# Patient Record
Sex: Female | Born: 1960
Health system: Southern US, Community
[De-identification: ages and names within clinical notes are randomized; demographics above are authoritative.]

## PROBLEM LIST (undated history)

## (undated) ENCOUNTER — Emergency Department (HOSPITAL_COMMUNITY): Admission: EM | Payer: No Typology Code available for payment source

## (undated) DIAGNOSIS — I5030 Unspecified diastolic (congestive) heart failure: Secondary | ICD-10-CM

## (undated) DIAGNOSIS — I8390 Asymptomatic varicose veins of unspecified lower extremity: Secondary | ICD-10-CM

## (undated) DIAGNOSIS — I5032 Chronic diastolic (congestive) heart failure: Secondary | ICD-10-CM

## (undated) DIAGNOSIS — R002 Palpitations: Secondary | ICD-10-CM

## (undated) DIAGNOSIS — E039 Hypothyroidism, unspecified: Secondary | ICD-10-CM

## (undated) DIAGNOSIS — B009 Herpesviral infection, unspecified: Secondary | ICD-10-CM

## (undated) DIAGNOSIS — R519 Headache, unspecified: Secondary | ICD-10-CM

## (undated) DIAGNOSIS — M5417 Radiculopathy, lumbosacral region: Secondary | ICD-10-CM

## (undated) DIAGNOSIS — G8929 Other chronic pain: Secondary | ICD-10-CM

## (undated) DIAGNOSIS — G4700 Insomnia, unspecified: Secondary | ICD-10-CM

## (undated) DIAGNOSIS — E538 Deficiency of other specified B group vitamins: Secondary | ICD-10-CM

## (undated) DIAGNOSIS — D72819 Decreased white blood cell count, unspecified: Secondary | ICD-10-CM

## (undated) DIAGNOSIS — R0602 Shortness of breath: Secondary | ICD-10-CM

## (undated) DIAGNOSIS — I071 Rheumatic tricuspid insufficiency: Secondary | ICD-10-CM

## (undated) DIAGNOSIS — N6019 Diffuse cystic mastopathy of unspecified breast: Secondary | ICD-10-CM

## (undated) DIAGNOSIS — I340 Nonrheumatic mitral (valve) insufficiency: Secondary | ICD-10-CM

## (undated) DIAGNOSIS — Z87898 Personal history of other specified conditions: Secondary | ICD-10-CM

## (undated) DIAGNOSIS — N189 Chronic kidney disease, unspecified: Secondary | ICD-10-CM

## (undated) DIAGNOSIS — K829 Disease of gallbladder, unspecified: Secondary | ICD-10-CM

## (undated) DIAGNOSIS — I499 Cardiac arrhythmia, unspecified: Secondary | ICD-10-CM

## (undated) DIAGNOSIS — M549 Dorsalgia, unspecified: Secondary | ICD-10-CM

## (undated) DIAGNOSIS — M4800 Spinal stenosis, site unspecified: Secondary | ICD-10-CM

## (undated) DIAGNOSIS — E610 Copper deficiency: Secondary | ICD-10-CM

## (undated) DIAGNOSIS — K5900 Constipation, unspecified: Secondary | ICD-10-CM

## (undated) DIAGNOSIS — H269 Unspecified cataract: Secondary | ICD-10-CM

## (undated) DIAGNOSIS — T7840XA Allergy, unspecified, initial encounter: Secondary | ICD-10-CM

## (undated) DIAGNOSIS — E669 Obesity, unspecified: Secondary | ICD-10-CM

## (undated) DIAGNOSIS — D510 Vitamin B12 deficiency anemia due to intrinsic factor deficiency: Secondary | ICD-10-CM

## (undated) DIAGNOSIS — L732 Hidradenitis suppurativa: Secondary | ICD-10-CM

## (undated) DIAGNOSIS — K911 Postgastric surgery syndromes: Secondary | ICD-10-CM

## (undated) DIAGNOSIS — R200 Anesthesia of skin: Secondary | ICD-10-CM

## (undated) DIAGNOSIS — M5126 Other intervertebral disc displacement, lumbar region: Secondary | ICD-10-CM

## (undated) DIAGNOSIS — R42 Dizziness and giddiness: Secondary | ICD-10-CM

## (undated) DIAGNOSIS — M719 Bursopathy, unspecified: Secondary | ICD-10-CM

## (undated) DIAGNOSIS — F419 Anxiety disorder, unspecified: Secondary | ICD-10-CM

## (undated) DIAGNOSIS — E0590 Thyrotoxicosis, unspecified without thyrotoxic crisis or storm: Secondary | ICD-10-CM

## (undated) DIAGNOSIS — G5730 Lesion of lateral popliteal nerve, unspecified lower limb: Secondary | ICD-10-CM

## (undated) DIAGNOSIS — I10 Essential (primary) hypertension: Secondary | ICD-10-CM

## (undated) DIAGNOSIS — E161 Other hypoglycemia: Secondary | ICD-10-CM

## (undated) DIAGNOSIS — E890 Postprocedural hypothyroidism: Secondary | ICD-10-CM

## (undated) DIAGNOSIS — R51 Headache: Secondary | ICD-10-CM

## (undated) DIAGNOSIS — Z5189 Encounter for other specified aftercare: Secondary | ICD-10-CM

## (undated) DIAGNOSIS — K219 Gastro-esophageal reflux disease without esophagitis: Secondary | ICD-10-CM

## (undated) DIAGNOSIS — R600 Localized edema: Secondary | ICD-10-CM

## (undated) DIAGNOSIS — M479 Spondylosis, unspecified: Secondary | ICD-10-CM

## (undated) DIAGNOSIS — R202 Paresthesia of skin: Secondary | ICD-10-CM

## (undated) DIAGNOSIS — I272 Pulmonary hypertension, unspecified: Secondary | ICD-10-CM

## (undated) DIAGNOSIS — Z8619 Personal history of other infectious and parasitic diseases: Secondary | ICD-10-CM

## (undated) DIAGNOSIS — M779 Enthesopathy, unspecified: Secondary | ICD-10-CM

## (undated) DIAGNOSIS — Z9889 Other specified postprocedural states: Secondary | ICD-10-CM

## (undated) DIAGNOSIS — M2550 Pain in unspecified joint: Secondary | ICD-10-CM

## (undated) DIAGNOSIS — K649 Unspecified hemorrhoids: Secondary | ICD-10-CM

## (undated) DIAGNOSIS — S83249A Other tear of medial meniscus, current injury, unspecified knee, initial encounter: Secondary | ICD-10-CM

## (undated) DIAGNOSIS — E611 Iron deficiency: Secondary | ICD-10-CM

## (undated) DIAGNOSIS — R112 Nausea with vomiting, unspecified: Secondary | ICD-10-CM

## (undated) DIAGNOSIS — G4733 Obstructive sleep apnea (adult) (pediatric): Secondary | ICD-10-CM

## (undated) DIAGNOSIS — Z86010 Personal history of colonic polyps: Secondary | ICD-10-CM

## (undated) DIAGNOSIS — M1990 Unspecified osteoarthritis, unspecified site: Secondary | ICD-10-CM

## (undated) DIAGNOSIS — R7303 Prediabetes: Secondary | ICD-10-CM

## (undated) DIAGNOSIS — K589 Irritable bowel syndrome without diarrhea: Secondary | ICD-10-CM

## (undated) HISTORY — DX: Spondylosis, unspecified: M47.9

## (undated) HISTORY — DX: Other tear of medial meniscus, current injury, unspecified knee, initial encounter: S83.249A

## (undated) HISTORY — DX: Prediabetes: R73.03

## (undated) HISTORY — PX: TOTAL HIP ARTHROPLASTY: SHX124

## (undated) HISTORY — DX: Herpesviral infection, unspecified: B00.9

## (undated) HISTORY — DX: Thyrotoxicosis, unspecified without thyrotoxic crisis or storm: E05.90

## (undated) HISTORY — DX: Constipation, unspecified: K59.00

## (undated) HISTORY — DX: Localized edema: R60.0

## (undated) HISTORY — DX: Dorsalgia, unspecified: M54.9

## (undated) HISTORY — DX: Vitamin B12 deficiency anemia due to intrinsic factor deficiency: D51.0

## (undated) HISTORY — DX: Gastro-esophageal reflux disease without esophagitis: K21.9

## (undated) HISTORY — DX: Nonrheumatic mitral (valve) insufficiency: I34.0

## (undated) HISTORY — PX: CARDIOVASCULAR STRESS TEST: SHX262

## (undated) HISTORY — DX: Lesion of lateral popliteal nerve, unspecified lower limb: G57.30

## (undated) HISTORY — DX: Disease of gallbladder, unspecified: K82.9

## (undated) HISTORY — PX: JOINT REPLACEMENT: SHX530

## (undated) HISTORY — DX: Enthesopathy, unspecified: M77.9

## (undated) HISTORY — DX: Hypothyroidism, unspecified: E03.9

## (undated) HISTORY — DX: Other chronic pain: G89.29

## (undated) HISTORY — DX: Rheumatic tricuspid insufficiency: I07.1

## (undated) HISTORY — PX: TOTAL ABDOMINAL HYSTERECTOMY: SHX209

## (undated) HISTORY — PX: TUBAL LIGATION: SHX77

## (undated) HISTORY — PX: EYE SURGERY: SHX253

## (undated) HISTORY — DX: Palpitations: R00.2

## (undated) HISTORY — DX: Pulmonary hypertension, unspecified: I27.20

## (undated) HISTORY — DX: Obesity, unspecified: E66.9

## (undated) HISTORY — DX: Hidradenitis suppurativa: L73.2

## (undated) HISTORY — DX: Bursopathy, unspecified: M71.9

## (undated) HISTORY — DX: Irritable bowel syndrome, unspecified: K58.9

## (undated) HISTORY — DX: Unspecified diastolic (congestive) heart failure: I50.30

## (undated) HISTORY — DX: Allergy, unspecified, initial encounter: T78.40XA

## (undated) HISTORY — DX: Unspecified cataract: H26.9

## (undated) HISTORY — DX: Other intervertebral disc displacement, lumbar region: M51.26

## (undated) HISTORY — DX: Paresthesia of skin: R20.2

## (undated) HISTORY — DX: Insomnia, unspecified: G47.00

## (undated) HISTORY — PX: SMALL INTESTINE SURGERY: SHX150

## (undated) HISTORY — DX: Shortness of breath: R06.02

## (undated) HISTORY — DX: Anesthesia of skin: R20.0

## (undated) HISTORY — DX: Encounter for other specified aftercare: Z51.89

## (undated) HISTORY — PX: REFRACTIVE SURGERY: SHX103

## (undated) HISTORY — DX: Iron deficiency: E61.1

## (undated) HISTORY — DX: Personal history of colonic polyps: Z86.010

## (undated) HISTORY — DX: Dizziness and giddiness: R42

## (undated) HISTORY — DX: Pain in unspecified joint: M25.50

## (undated) HISTORY — DX: Obstructive sleep apnea (adult) (pediatric): G47.33

## (undated) HISTORY — DX: Spinal stenosis, site unspecified: M48.00

## (undated) HISTORY — DX: Copper deficiency: E61.0

## (undated) HISTORY — DX: Radiculopathy, lumbosacral region: M54.17

## (undated) HISTORY — DX: Unspecified osteoarthritis, unspecified site: M19.90

## (undated) HISTORY — DX: Chronic diastolic (congestive) heart failure: I50.32

## (undated) HISTORY — PX: HERNIA REPAIR: SHX51

## (undated) HISTORY — DX: Decreased white blood cell count, unspecified: D72.819

---
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
## 1985-02-26 HISTORY — PX: CHOLECYSTECTOMY OPEN: SUR202

## 1998-01-11 ENCOUNTER — Ambulatory Visit: Admission: RE | Admit: 1998-01-11 | Discharge: 1998-01-11 | Payer: Self-pay | Admitting: Family Medicine

## 1999-02-27 DIAGNOSIS — Z8619 Personal history of other infectious and parasitic diseases: Secondary | ICD-10-CM

## 1999-02-27 HISTORY — DX: Personal history of other infectious and parasitic diseases: Z86.19

## 1999-11-22 ENCOUNTER — Emergency Department (HOSPITAL_COMMUNITY): Admission: EM | Admit: 1999-11-22 | Discharge: 1999-11-22 | Payer: Self-pay | Admitting: Emergency Medicine

## 2000-09-27 ENCOUNTER — Encounter: Payer: Self-pay | Admitting: Internal Medicine

## 2000-09-27 ENCOUNTER — Encounter: Admission: RE | Admit: 2000-09-27 | Discharge: 2000-09-27 | Payer: Self-pay | Admitting: Internal Medicine

## 2000-12-05 ENCOUNTER — Encounter: Admission: RE | Admit: 2000-12-05 | Discharge: 2000-12-05 | Payer: Self-pay | Admitting: Internal Medicine

## 2000-12-05 ENCOUNTER — Encounter: Payer: Self-pay | Admitting: Internal Medicine

## 2000-12-24 ENCOUNTER — Encounter: Admission: RE | Admit: 2000-12-24 | Discharge: 2000-12-24 | Payer: Self-pay | Admitting: Internal Medicine

## 2000-12-24 ENCOUNTER — Encounter: Payer: Self-pay | Admitting: Internal Medicine

## 2001-11-17 ENCOUNTER — Other Ambulatory Visit: Admission: RE | Admit: 2001-11-17 | Discharge: 2001-11-17 | Payer: Self-pay | Admitting: Internal Medicine

## 2002-05-05 ENCOUNTER — Encounter: Payer: Self-pay | Admitting: Obstetrics and Gynecology

## 2002-05-05 ENCOUNTER — Encounter: Admission: RE | Admit: 2002-05-05 | Discharge: 2002-05-05 | Payer: Self-pay | Admitting: Obstetrics and Gynecology

## 2002-08-25 ENCOUNTER — Inpatient Hospital Stay (HOSPITAL_COMMUNITY): Admission: RE | Admit: 2002-08-25 | Discharge: 2002-08-27 | Payer: Self-pay | Admitting: Obstetrics and Gynecology

## 2002-08-25 ENCOUNTER — Encounter (INDEPENDENT_AMBULATORY_CARE_PROVIDER_SITE_OTHER): Payer: Self-pay | Admitting: *Deleted

## 2003-06-02 ENCOUNTER — Ambulatory Visit (HOSPITAL_COMMUNITY): Admission: RE | Admit: 2003-06-02 | Discharge: 2003-06-02 | Payer: Self-pay | Admitting: Internal Medicine

## 2003-06-26 ENCOUNTER — Emergency Department (HOSPITAL_COMMUNITY): Admission: EM | Admit: 2003-06-26 | Discharge: 2003-06-27 | Payer: Self-pay | Admitting: *Deleted

## 2003-07-06 ENCOUNTER — Other Ambulatory Visit: Admission: RE | Admit: 2003-07-06 | Discharge: 2003-07-06 | Payer: Self-pay | Admitting: Internal Medicine

## 2004-02-27 HISTORY — PX: PARTIAL HYSTERECTOMY: SHX80

## 2004-06-23 ENCOUNTER — Ambulatory Visit (HOSPITAL_COMMUNITY): Admission: RE | Admit: 2004-06-23 | Discharge: 2004-06-23 | Payer: Self-pay | Admitting: Internal Medicine

## 2004-07-13 ENCOUNTER — Other Ambulatory Visit: Admission: RE | Admit: 2004-07-13 | Discharge: 2004-07-13 | Payer: Self-pay | Admitting: Internal Medicine

## 2005-08-02 ENCOUNTER — Ambulatory Visit (HOSPITAL_COMMUNITY): Admission: RE | Admit: 2005-08-02 | Discharge: 2005-08-02 | Payer: Self-pay | Admitting: Internal Medicine

## 2005-08-03 ENCOUNTER — Other Ambulatory Visit: Admission: RE | Admit: 2005-08-03 | Discharge: 2005-08-03 | Payer: Self-pay | Admitting: Internal Medicine

## 2006-03-06 ENCOUNTER — Ambulatory Visit (HOSPITAL_COMMUNITY): Admission: RE | Admit: 2006-03-06 | Discharge: 2006-03-06 | Payer: Self-pay | Admitting: Internal Medicine

## 2006-04-15 ENCOUNTER — Ambulatory Visit: Payer: Self-pay | Admitting: Internal Medicine

## 2006-04-15 LAB — CONVERTED CEMR LAB
AST: 15 units/L (ref 0–37)
Cholesterol: 172 mg/dL (ref 0–200)
Triglycerides: 36 mg/dL (ref 0–149)

## 2006-08-05 ENCOUNTER — Ambulatory Visit (HOSPITAL_COMMUNITY): Admission: RE | Admit: 2006-08-05 | Discharge: 2006-08-05 | Payer: Self-pay | Admitting: Internal Medicine

## 2007-08-14 ENCOUNTER — Ambulatory Visit (HOSPITAL_COMMUNITY): Admission: RE | Admit: 2007-08-14 | Discharge: 2007-08-14 | Payer: Self-pay | Admitting: Internal Medicine

## 2007-08-19 ENCOUNTER — Encounter: Admission: RE | Admit: 2007-08-19 | Discharge: 2007-08-19 | Payer: Self-pay | Admitting: Internal Medicine

## 2007-10-03 ENCOUNTER — Ambulatory Visit: Payer: Self-pay | Admitting: Internal Medicine

## 2007-10-17 ENCOUNTER — Ambulatory Visit: Payer: Self-pay | Admitting: Internal Medicine

## 2007-10-17 ENCOUNTER — Encounter: Payer: Self-pay | Admitting: Internal Medicine

## 2007-10-17 DIAGNOSIS — Z86010 Personal history of colonic polyps: Secondary | ICD-10-CM

## 2007-10-17 DIAGNOSIS — Z860101 Personal history of adenomatous and serrated colon polyps: Secondary | ICD-10-CM

## 2007-10-17 HISTORY — DX: Personal history of colonic polyps: Z86.010

## 2007-10-17 HISTORY — DX: Personal history of adenomatous and serrated colon polyps: Z86.0101

## 2007-10-20 ENCOUNTER — Encounter: Payer: Self-pay | Admitting: Internal Medicine

## 2007-12-19 ENCOUNTER — Other Ambulatory Visit: Admission: RE | Admit: 2007-12-19 | Discharge: 2007-12-19 | Payer: Self-pay | Admitting: Internal Medicine

## 2007-12-19 ENCOUNTER — Encounter: Payer: Self-pay | Admitting: Internal Medicine

## 2007-12-19 ENCOUNTER — Ambulatory Visit: Payer: Self-pay | Admitting: Internal Medicine

## 2008-02-16 ENCOUNTER — Ambulatory Visit: Payer: Self-pay | Admitting: Internal Medicine

## 2008-02-17 ENCOUNTER — Ambulatory Visit (HOSPITAL_BASED_OUTPATIENT_CLINIC_OR_DEPARTMENT_OTHER): Admission: RE | Admit: 2008-02-17 | Discharge: 2008-02-17 | Payer: Self-pay | Admitting: Podiatry

## 2008-02-17 HISTORY — PX: HAMMER TOE SURGERY: SHX385

## 2008-06-18 ENCOUNTER — Ambulatory Visit: Payer: Self-pay | Admitting: Internal Medicine

## 2008-08-16 ENCOUNTER — Ambulatory Visit (HOSPITAL_COMMUNITY): Admission: RE | Admit: 2008-08-16 | Discharge: 2008-08-16 | Payer: Self-pay | Admitting: Internal Medicine

## 2008-12-01 ENCOUNTER — Emergency Department (HOSPITAL_COMMUNITY): Admission: EM | Admit: 2008-12-01 | Discharge: 2008-12-01 | Payer: Self-pay | Admitting: Emergency Medicine

## 2008-12-17 ENCOUNTER — Other Ambulatory Visit: Admission: RE | Admit: 2008-12-17 | Discharge: 2008-12-17 | Payer: Self-pay | Admitting: Internal Medicine

## 2008-12-17 ENCOUNTER — Ambulatory Visit: Payer: Self-pay | Admitting: Internal Medicine

## 2009-01-31 ENCOUNTER — Inpatient Hospital Stay (HOSPITAL_COMMUNITY): Admission: RE | Admit: 2009-01-31 | Discharge: 2009-02-03 | Payer: Self-pay | Admitting: Orthopedic Surgery

## 2009-03-22 ENCOUNTER — Encounter: Admission: RE | Admit: 2009-03-22 | Discharge: 2009-04-14 | Payer: Self-pay | Admitting: Orthopedic Surgery

## 2009-03-28 ENCOUNTER — Ambulatory Visit: Payer: Self-pay | Admitting: Internal Medicine

## 2009-07-30 ENCOUNTER — Emergency Department (HOSPITAL_COMMUNITY): Admission: EM | Admit: 2009-07-30 | Discharge: 2009-07-30 | Payer: Self-pay | Admitting: Emergency Medicine

## 2009-08-03 ENCOUNTER — Ambulatory Visit: Payer: Self-pay | Admitting: Internal Medicine

## 2009-10-04 ENCOUNTER — Ambulatory Visit (HOSPITAL_COMMUNITY): Admission: RE | Admit: 2009-10-04 | Discharge: 2009-10-04 | Payer: Self-pay | Admitting: Internal Medicine

## 2010-02-03 ENCOUNTER — Ambulatory Visit: Payer: Self-pay | Admitting: Internal Medicine

## 2010-03-09 ENCOUNTER — Telehealth: Payer: Self-pay | Admitting: Internal Medicine

## 2010-03-20 ENCOUNTER — Ambulatory Visit: Admission: RE | Admit: 2010-03-20 | Discharge: 2010-03-20 | Payer: Self-pay | Source: Home / Self Care

## 2010-03-20 DIAGNOSIS — E669 Obesity, unspecified: Secondary | ICD-10-CM | POA: Insufficient documentation

## 2010-03-20 DIAGNOSIS — M549 Dorsalgia, unspecified: Secondary | ICD-10-CM | POA: Insufficient documentation

## 2010-03-27 ENCOUNTER — Encounter: Payer: Self-pay | Admitting: Family Medicine

## 2010-03-28 NOTE — Miscellaneous (Signed)
Summary: [facility] Previsit/prep  Clinical Lists Changes  Medications: Added new medication of MOVIPREP 100 GM  SOLR (PEG-KCL-NACL-NASULF-NA ASC-C) As per prep instructions. - Signed Rx of MOVIPREP 100 GM  SOLR (PEG-KCL-NACL-NASULF-NA ASC-C) As per prep instructions.;  #1 x 0;  Signed;  Entered by: Wyona Almas RN;  Authorized by: Hart Carwin MD;  Method used: Electronic Observations: Added new observation of NKA: T (10/03/2007 17:00)    Prescriptions: MOVIPREP 100 GM  SOLR (PEG-KCL-NACL-NASULF-NA ASC-C) As per prep instructions.  #1 x 0   Entered by:   Wyona Almas RN   Authorized by:   Hart Carwin MD   Signed by:   Wyona Almas RN on 10/03/2007   Method used:   Electronically sent to ...       [facility]*       [address]       [address] Shipping/mailing       [place], [place]  [address]       Ph: [phone]       Fax: [phone]   RxID:   [id number]   Patient given:  Patient Information Guide, Teaching/ Information, Colon Frequently Asked Questions and Colon Pamphlet

## 2010-03-28 NOTE — Miscellaneous (Signed)
Summary: protonix rx  Dr Juanda Chance gave patient an rx for prevacid when she had a procedure, however insurance would not cover prevacid. Patient has requested protonix instead. Hortense Ramal [facility]  December 19, 2007 8:59 AM   Clinical Lists Changes  Medications: Added new medication of PROTONIX 40 MG TBEC (PANTOPRAZOLE SODIUM) Take 1 tablet by mouth once a day  (please use this in place of prevacid rx) - Signed Rx of PROTONIX 40 MG TBEC (PANTOPRAZOLE SODIUM) Take 1 tablet by mouth once a day  (please use this in place of prevacid rx);  #30 x 3;  Signed;  Entered by: Hortense Ramal CMA;  Authorized by: Hart Carwin MD;  Method used: Electronically to [facility]*, [address], [address] Shipping/mailing, [place], [place]  [address], Ph: [phone], Fax: [phone]    Prescriptions: PROTONIX 40 MG TBEC (PANTOPRAZOLE SODIUM) Take 1 tablet by mouth once a day  (please use this in place of prevacid rx)  #30 x 3   Entered by:   Hortense Ramal CMA   Authorized by:   Hart Carwin MD   Signed by:   Hortense Ramal CMA on 12/19/2007   Method used:   Electronically to        [facility]* (retail)       [address]       [address] Shipping/mailing       [place], [place]  [address]       Ph: [phone]       Fax: [phone]   RxID:   [id number]

## 2010-03-28 NOTE — Letter (Signed)
Summary: Patient Notice- Polyp Results  [facility]  [address] [place], [place] [address]   Phone: [phone]  Fax: [phone]        October 20, 2007 MRN: [id number]    [facility] [address] [place], [place]  [address]    Dear Ms. TELLIS,  I am pleased to inform you that the colon polyp(s) removed during your recent colonoscopy was (were) found to be benign (no cancer detected) upon pathologic examination.Your polyp was adenomatous.  I recommend you have a repeat colonoscopy examination in _5 years to look for recurrent polyps, as having colon polyps increases your risk for having recurrent polyps or even colon cancer in the future.  Should you develop new or worsening symptoms of abdominal pain, bowel habit changes or bleeding from the rectum or bowels, please schedule an evaluation with either your primary care physician or with me.  Additional information/recommendations:  _x_ No further action with gastroenterology is needed at this time. Please      follow-up with your primary care physician for your other healthcare      needs.   Recent guidelines suggest a 7 year recall interval for adenomatous polyps less than 1 cm in size. Ypur polyp was 7 mm  Please call us if you are having persistent problems or have questions about your condition that have not been fully answered at this time.  Sincerely,  Hart Carwin MD  This letter has been electronically signed by your physician.

## 2010-03-28 NOTE — Procedures (Signed)
Summary: Colonoscopy   Colonoscopy  Procedure date:  10/17/2007  Findings:      Location:  [facility].    Procedures Next Due Date:    Colonoscopy: 10/2012  Patient Name: Victoria [name] MRN:  Procedure Procedures: Colonoscopy CPT: 29562.    with biopsy. CPT: Q5068410.  Personnel: Endoscopist: Dora L. Juanda Chance, MD.  Exam Location: Exam performed in Outpatient Clinic. Outpatient  Patient Consent: Procedure, Alternatives, Risks and Benefits discussed, consent obtained, from patient. Consent was obtained by the RN.  Indications  Average Risk Screening Routine.  History  Current Medications: Patient is not currently taking Coumadin.  Pre-Exam Physical: Performed Oct 17, 2007. Cardio-pulmonary exam, HEENT exam , Abdominal exam, Extremity exam, Neurological exam, Mental status exam WNL.  Comments: Pt. history reviewed/updated, physical exam performed prior to initiation of sedation?yes Exam Exam: Extent of exam reached: Cecum, extent intended: Cecum.  The cecum was identified by appendiceal orifice and IC valve. Duration of exam: time in 9:32 min, out 8:23 min minutes. Colon retroflexion performed. Images taken. ASA Classification: I. Tolerance: good.  Monitoring: Pulse and BP monitoring, Oximetry used. Supplemental O2 given.  Colon Prep Used Moviprep for colon prep. Prep results: excellent.  Sedation Meds: Patient assessed and found to be appropriate for moderate (conscious) sedation. Fentanyl 125 mcg. given IV. Versed 10 mg. given IV.  Findings - NORMAL EXAM: Cecum.  POLYP: Sigmoid Colon, Maximum size: 4 mm. sessile polyp. Distance from Anus 30 cm. Procedure:  biopsy without cautery, The polyp was removed piece meal. removed, retrieved, Polyp sent to pathology. ICD9: Colon Polyps: 211.3.  - NORMAL EXAM: Rectum.   Assessment Abnormal examination, see findings above.  Diagnoses: 211.3: Colon Polyps.   Comments: small sigmoid polyp removed in pieces  from 30 cm Events  Unplanned Interventions: No intervention was required.  Unplanned Events: There were no complications. Plans Medication Plan: Await pathology.  Patient Education: Patient given standard instructions for: Patient instructed to get routine colonoscopy every 5-7 years.  Comments: continue PPI's for GERD Disposition: After procedure patient sent to recovery. After recovery patient sent home.     cc:  Sharlet Salina, MD  REPORT OF SURGICAL PATHOLOGY   Case #: [id number] Patient Name: Victoria [name], Victoria [name]. Office Chart Number:  [id number]   MRN: [id number] Pathologist: Alden Server A. Delila Spence, MD DOB/Age  Jun 07, 1960 (Age: 50)    Gender: F Date Taken:  10/17/2007 Date Received: 10/17/2007   FINAL DIAGNOSIS   ***MICROSCOPIC EXAMINATION AND DIAGNOSIS***   COLON, POLYP(S) AT 30 CM:  ADENOMATOUS POLYP(S).  NO HIGH GRADE DYSPLASIA OR INVASIVE MALIGNANCY IDENTIFIED. (TWO FRAGMENTS)   gdt Date Reported:  10/20/2007     Alden Server A. Delila Spence, MD *** Electronically Signed Out By E[facility] ***   Clinical information Screening.  R/O adenoma (mj)   specimen(s) obtained Colon, polyp(s), 30 cm   Gross Description Received in formalin are tan, soft tissue fragments that are submitted in toto.  Number:  two. Size:  each 0.3 cm.  (GP:gt, 10/17/07)   gdt/     Signed by Hart Carwin MD on 10/20/2007 at 9:07 PM  ________________________________________________________________________ recall colon 5 years  October 20, 2007 MRN: [id number]    [facility] [address] [place], [place]  [address]    Dear Victoria [name],  I am pleased to inform you that the colon polyp(s) removed during your recent colonoscopy was (were) found to be benign (no cancer detected) upon pathologic examination.Your polyp was adenomatous.  I recommend you have a repeat colonoscopy examination  in _5 years to look for recurrent polyps, as having colon polyps increases your risk for having recurrent  polyps or even colon cancer in the future.  Should you develop new or worsening symptoms of abdominal pain, bowel habit changes or bleeding from the rectum or bowels, please schedule an evaluation with either your primary care physician or with me.  Additional information/recommendations:  _x_ No further action with gastroenterology is needed at this time. Please      follow-up with your primary care physician for your other healthcare      needs.   Recent guidelines suggest a 7 year recall interval for adenomatous polyps less than 1 cm in size. Ypur polyp was 7 mm  Please call us if you are having persistent problems or have questions about your condition that have not been fully answered at this time.  Sincerely,  Hart Carwin MD  This letter has been electronically signed by your physician.   Signed by Hart Carwin MD on 10/20/2007 at 9:09 PM  This report was created from the original endoscopy report, which was reviewed and signed by the above listed endoscopist.

## 2010-03-30 NOTE — Assessment & Plan Note (Signed)
Summary: Seeing Victoria [name] / [facility]   Vital Signs:  Patient profile:   50 year old female Height:      67.5 inches Weight:      258.8 pounds BMI:     40.08  Vitals Entered By: Wyona Almas PHD (March 20, 2010 3:14 PM)  History of Present Illness: Assessment:  Spent 60 min w/ pt.  Ms. Victoria [name] works 11PM -7AM at [employer] [facility] [employer] on weekends, 7:30-AM-6 PM @ [employer] 4 X wk, and 8 hr/wk at [employer].  Average # hrs sleep per night is 4-6.  Eating pattern is erratic.  Everyday foods/beverages include water only.  She has no usual exercise routine.   Hip replcmt in Dec 2010; thyroidectomy 1980, C-section 1985,  partial hysterectomy, cholecystect 1987, arthritis in R hip; replacement pending.  Back pain due to R hip.  Her doctor referred her for MNT b/c of back pain, deteriorating R hip, and family hx of DM.  Father, sister, and PGM are diabetic.  Ms. Victoria [name] lives with her boyfriend, who has DM2, and w/ whom she shares meals.  24-hr recall suggests intake of 1500-2000 (not sure if ribs was 1/2 or full rack) kcal: B (8 AM)- 3 c Captain Crunch, 1 c 1% milk; Snk (AM)- 2 hard caramel candies; D (4 PM)- Outback rack of ribs, baked potato w/ butter, cheese, bacon bits, water; to bed  ~4:30 PM till 6:30 AM.  Takeout or restaurant foods 2-3 X wk.  Med's include Synthroid 150 micrograms, ASA 81 mg, Meloxicam 15 mg, & B12 injxn monthly.  Ms. Victoria [name] lost wt a couple yrs ago when doing Curves exercise regularly and eating 3 balanced meals/day.    Nutrition Diagnosis:  Physical inactivity (NB-2.1) related to time constraints as evidenced by approx 64 work hours per week.  Inappropriate intake of types of carbohydrate (NI-53.3) related tofruit & veg's as evidenced by no fruit or veg's yesterday.    Intervention: See Patient Instructions.    Monitoring/Eval:  Dietary intake, body weight, and exercise at F/U.     Allergies: No Known Drug Allergies   Complete Medication List: 1)  Nexium  40 Mg Cpdr (Esomeprazole magnesium) .... Take 1 tablet by mouth once a day  Other Orders: Inital Assessment Each - [facility] (81191)  Patient Instructions: 1)  Eat at least 3 meals and 1-2 snacks per day. No more than 5 hours between eating.  2)  Assignments:  (a) Map out an ideal eating schedule.  Email to [facility] @ [email or web address]. (b) List veg's you do currently eat; list veg's you won't consider; list veg's you might consider.  Work from third list:  Try one of these veg's at least 3 X wk.    3)  Treadmill:  20 min 3 X wk. Write down the # of minutes you exercise on a calendar.   4)  Obtain twice as many veg's as protein or carbohydrate foods for both lunch and dinner. 5)  Please schedule a nutrition appt for 3 wks.     Orders Added: 1)  Inital Assessment Each - [facility] [[id number]]

## 2010-03-30 NOTE — Progress Notes (Signed)
Summary: Nexium Rx  Phone Note Other Incoming   Caller: Dr Juanda Chance Summary of Call: Dr Juanda Chance called down from [facility]. She would like patient to have Nexium 40 mg daily, 90 day supply with 3 refills sent to [facility]. Prescription sent per Dr Regino Schultze request. Initial call taken by: Lamona Curl CMA (AAMA),  March 09, 2010 11:35 AM    New/Updated Medications: NEXIUM 40 MG CPDR (ESOMEPRAZOLE MAGNESIUM) Take 1 tablet by mouth once a day Prescriptions: NEXIUM 40 MG CPDR (ESOMEPRAZOLE MAGNESIUM) Take 1 tablet by mouth once a day  #90 x 3   Entered by:   Lamona Curl CMA (AAMA)   Authorized by:   Hart Carwin MD   Signed by:   Lamona Curl CMA (AAMA) on 03/09/2010   Method used:   Electronically to        [facility]* (retail)       [address]       [address] Shipping/mailing       [place], [place]  [address]       Ph: [phone]       Fax: [phone]   RxID:   [id number]

## 2010-04-05 NOTE — Miscellaneous (Signed)
Summary: Meal Time Target Goals  Clinical Lists Changes Victoria [name] emailed her newly determined goals for meal times, which are as follows:   7:30-8      Breakfast 10 AM     Snk 12     Lunch 2 PM     Snk 5      Dinner Bedtime -    Snack  She was pessimistic she can actually meet these goals given her work schedule; however, having goals may help her at least come close.

## 2010-04-17 ENCOUNTER — Ambulatory Visit (INDEPENDENT_AMBULATORY_CARE_PROVIDER_SITE_OTHER): Payer: Commercial Managed Care - PPO | Admitting: Family Medicine

## 2010-04-17 DIAGNOSIS — E669 Obesity, unspecified: Secondary | ICD-10-CM

## 2010-04-17 NOTE — Patient Instructions (Addendum)
Get back on your vitamin supplement, 2000 IU/day, and ask your doctor about getting a vitamin D test.  Meal and snack times:  Ideally, the schedule will be: 7:30-8   Breakfast 10 AM  Snk 12  Lunch 2 PM  Snk 5   Dinner Bedtime -  Snack Or, if your work and sleep schedule is different, then eat within first hour of being up, and go no more than 5 hours without eating.   Three distinct meals during the day, including vegetables at least once a day.  A meal should look like, taste like, and feel like a REAL MEAL.   If you use frozen dinners, add veg's.   Planning ahead will be key to making this work.  Check out Curves on [address] TODAY.   Exercise goal:  At least 90 minutes per week.   Record your exercise on your calendar.   Sweets:  Keep it out of the house.  Having alternative snacks on hand will also help you to resist sugary foods.  The more you exercise consistently and more nutritionally balanced your eating is, the less you will crave sugar.  Craving sugar is often a sign of needing comfort.  This may be a clue to you that you need to carve out time for yourself.

## 2010-04-17 NOTE — Progress Notes (Signed)
Medical Nutrition Therapy:  Appt start time: 1630 end time:  1700.  Assessment:  Primary concerns today: Weight management. 24-hr recall suggests intake of 1990 kcal: B (8:30 AM)- 1/2 c grits, 2 scrambled eggs, 2 Neese's sausage patties, 2 c fruit punch; (napped noon-4 PM); Snk (4:30 AM)- 5 or 6 Nutty Buddy ice cream cones.  Victoria [name] went to bed 8 PM-2 AM, then got up and ate bkfst ~4 AM:  country ham, 1 slc bread, 2 c fruit punch; today's lunch was ~12:30 PM- country ham sandwich, banana, donut, water.  She cited stress and feeling overwhelmed as the main driver behind yesterday's poor food choices.  We talked at length re. The need for Victoria [name] to carve out some time for herself, to take some steps to manage stress in her life rather than using (dysfunctional) strategies like eating.  Victoria [name] has walked on the TM some, but less so this past wk, as family obligations have gotten in the way.   Progress Towards Goal(s):  In progress   Nutritional Diagnosis:  NB-2.1 Physical inactivity As related to time constraints.  As evidenced by work hours exceeding 60/wk. NI-5.8.3 Inappropriate intake of types of carbohydrates (specify):   As related to fruits and veg's.  As evidenced by no fruit or veg's yesterday.    Intervention:  Nutrition counseling.  Monitoring/Evaluation:  Dietary intake, exercise, and weight  in 1 month.

## 2010-05-15 LAB — POCT RAPID STREP A (OFFICE): Streptococcus, Group A Screen (Direct): NEGATIVE

## 2010-05-18 ENCOUNTER — Ambulatory Visit (INDEPENDENT_AMBULATORY_CARE_PROVIDER_SITE_OTHER): Payer: Commercial Managed Care - PPO | Admitting: Family Medicine

## 2010-05-18 ENCOUNTER — Encounter: Payer: Self-pay | Admitting: Family Medicine

## 2010-05-18 DIAGNOSIS — E669 Obesity, unspecified: Secondary | ICD-10-CM

## 2010-05-18 NOTE — Patient Instructions (Addendum)
-   Continue eating breakfast, but make it small, whatever you will tolerate well.   - If you try cereal, choose one with at least 5 grams of fiber per serving.  (Or add All Bran to lower-fiber cereals).  (All Bran has 10 g fiber per half-cup.) - Try adding some walnuts to your yogurt breakfast, and substitute an apple or berries for your banana on most days.  You may want to try [place] yogurt at some point.   - Exercise goal:  At least 30 minutes 3 X week.   - Record your exercise regularly.   - Look for exercise opportunities.  Never lie down when you can sit; never sit when you can stand; never stand when you can pace.   [employer] job so far!  Keep up the momentum.

## 2010-05-18 NOTE — Progress Notes (Signed)
Medical Nutrition Therapy:  Appt start time: 900 end time: 1000.  Assessment:  Primary concerns today: Weight management. Ms. Victoria [name] has been eating more veg's, aiming for at least once a day.  When eating out, she has been careful with portion sizes.  She has also been limiting carb intake.  She has been exercising (Curves 2-3 X wk) and walking at the park once a wk.  24-hr recall suggests intake of ~1660 kcal: B (6:30 AM)- 6 oz yogurt (80 kcal), fruit cocktail (70 kcal), water; L (12 PM)- 2 slc pepperoni, hambgr pizza, 100-kcal cookies, fruit cocktail, water; Snk (1 PM)- 4 oz pork chop; D (6:30 PM)- 3/4 c baked beans, 4-5 oz hambgr patty, water; Snk (7:30 PM)- 1 c sherbet.  Yesterday was atypical in that [facility] did not have any veg's.    Progress Towards Goal(s):  Some progress; although 24-hr recall indicates no F/V yesterday, [name] said that is atypical of late.  Although exercise is not yet at goal, she has made progress.     Nutritional Diagnosis:  NB-2.1 Physical inactivity As related to time constraints.  As evidenced by work hours exceeding 60/wk. NI-5.8.3 Inappropriate intake of types of carbohydrates (specify):   As related to fruits and veg's.  As evidenced by no fruit or veg's yesterday.    Intervention:  Nutrition counseling.  Monitoring/Evaluation:  Dietary intake, exercise, and weight  in 1 month.

## 2010-05-26 ENCOUNTER — Telehealth: Payer: Self-pay | Admitting: *Deleted

## 2010-05-26 MED ORDER — PROMETHAZINE HCL 25 MG PO TABS: ORAL_TABLET | ORAL | Status: DC

## 2010-05-26 NOTE — Telephone Encounter (Signed)
Per Dr Juanda Chance, she has spoken to [facility] and would like for an rx to be called in for phenergan. Patient also to be added to schedule for Monday. Rx has been ordered and patient has been added to 05/29/10 schedule at 8:45 am.

## 2010-05-29 ENCOUNTER — Other Ambulatory Visit (INDEPENDENT_AMBULATORY_CARE_PROVIDER_SITE_OTHER): Payer: 59

## 2010-05-29 ENCOUNTER — Ambulatory Visit (INDEPENDENT_AMBULATORY_CARE_PROVIDER_SITE_OTHER): Payer: Commercial Managed Care - PPO | Admitting: Internal Medicine

## 2010-05-29 ENCOUNTER — Encounter: Payer: Self-pay | Admitting: Internal Medicine

## 2010-05-29 ENCOUNTER — Other Ambulatory Visit (INDEPENDENT_AMBULATORY_CARE_PROVIDER_SITE_OTHER): Payer: 59 | Admitting: Internal Medicine

## 2010-05-29 DIAGNOSIS — R112 Nausea with vomiting, unspecified: Secondary | ICD-10-CM

## 2010-05-29 DIAGNOSIS — R1013 Epigastric pain: Secondary | ICD-10-CM

## 2010-05-29 DIAGNOSIS — R109 Unspecified abdominal pain: Secondary | ICD-10-CM

## 2010-05-29 LAB — CBC WITH DIFFERENTIAL/PLATELET
Basophils Absolute: 0 10*3/uL (ref 0.0–0.1)
Basophils Relative: 0.3 % (ref 0.0–3.0)
Eosinophils Absolute: 0.1 10*3/uL (ref 0.0–0.7)
Eosinophils Relative: 2.6 % (ref 0.0–5.0)
HCT: 38.2 % (ref 36.0–46.0)
Hemoglobin: 13.1 g/dL (ref 12.0–15.0)
Lymphocytes Relative: 35.5 % (ref 12.0–46.0)
Lymphs Abs: 1.2 10*3/uL (ref 0.7–4.0)
MCHC: 34.3 g/dL (ref 30.0–36.0)
MCV: 97.6 fl (ref 78.0–100.0)
Monocytes Absolute: 0.5 10*3/uL (ref 0.1–1.0)
Monocytes Relative: 15.7 % — ABNORMAL HIGH (ref 3.0–12.0)
Neutro Abs: 1.5 10*3/uL (ref 1.4–7.7)
Neutrophils Relative %: 45.9 % (ref 43.0–77.0)
Platelets: 182 10*3/uL (ref 150.0–400.0)
RBC: 3.91 Mil/uL (ref 3.87–5.11)
RDW: 13.7 % (ref 11.5–14.6)
WBC: 3.4 10*3/uL — ABNORMAL LOW (ref 4.5–10.5)

## 2010-05-29 LAB — COMPREHENSIVE METABOLIC PANEL
ALT: 20 U/L (ref 0–35)
AST: 21 U/L (ref 0–37)
Albumin: 3.7 g/dL (ref 3.5–5.2)
Alkaline Phosphatase: 44 U/L (ref 39–117)
BUN: 10 mg/dL (ref 6–23)
CO2: 26 mEq/L (ref 19–32)
Calcium: 8.6 mg/dL (ref 8.4–10.5)
Chloride: 101 mEq/L (ref 96–112)
Creatinine, Ser: 0.9 mg/dL (ref 0.4–1.2)
GFR: 87.66 mL/min (ref >60.00)
Glucose, Bld: 82 mg/dL (ref 70–99)
Potassium: 3.4 mEq/L — ABNORMAL LOW (ref 3.5–5.1)
Sodium: 138 mEq/L (ref 135–145)
Total Bilirubin: 0.3 mg/dL (ref 0.3–1.2)
Total Protein: 7 g/dL (ref 6.0–8.3)

## 2010-05-29 LAB — AMYLASE: Amylase: 40 U/L (ref 27–131)

## 2010-05-29 LAB — LIPASE: Lipase: 15 U/L (ref 11.0–59.0)

## 2010-05-29 LAB — TSH: TSH: 0.39 u[IU]/mL (ref 0.35–5.50)

## 2010-05-29 NOTE — Patient Instructions (Addendum)
You have been scheduled for an endoscopy on 05/30/10. Please see separate instructions given to you at your visit today. Your physician has requested that you go to the basement for the following lab work before leaving today: CBC, CMET, Amylase, Lipase, TSH. Continue your phenergan as needed for nausea, vomiting. CC Dr Algis Liming

## 2010-05-29 NOTE — Progress Notes (Signed)
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 This became worse over the last 4 days when she had continuous vomiting for 24 hours of food as well as acid and bile. She is status post remote cholecystectomy in 1987. She has been on ibuprofen 800 mg when necessary after a hip replacement  in December 2010. She denies any fever but has had some chills. Her weight has been stable. An upper endoscopy in 2005 showed a 2 cm hiatal hernia and esophagitis. A colonoscopy in August 2009 showed a 4 mm polyp at 30 cm which was a tubular adenoma. She has been on Nexium 40 mg daily and Phenergan when necessary for nausea.   Past Medical History  Diagnosis Date  . History of colon polyps 10/17/07    adenomatous polyp  . Osteoarthritis     left hip  . Hydradenitis   . GERD (gastroesophageal reflux disease)   . Hypothyroidism   . Pernicious anemia    Past Surgical History  Procedure Date  . Hip arthroplasty 01/31/09    left  . Abdominal hysterectomy 2004  . Cholecystectomy 1987  . Thyroidectomy 1980  . Cesarean section 1985    reports that she has never smoked. She has never used smokeless tobacco. She reports that she does not drink alcohol or use illicit drugs. family history includes Diabetes in her father and sister. No Known Allergies      Review of Systems: Positive for chest discomfort associated with vomiting. No dysphagia. No hematemesis. Bowel habits have been regular, she's complaining of symptomatic hemorrhoids. Weight stable. She denies jaundice  The remainder of the 10  point ROS is negative except as outlined in H&P   Physical Exam: General appearance  Well developed, in no distress. Eyes- non icteric HEENT nontraumatic, normocephalic Mouth no lesions, tongue papillated, no cheilosis Neck supple without adenopathy, thyroid not  enlarged, no carotid bruits, no JVD Lungs Clear to auscultation bilaterally. Cor normal S1 normal S2, regular rhythm , no murmur,  quiet precordium. Abdomen soft relaxed abdomen with mild tenderness in epigastrium. Normoactive bowel sounds. No distention. Post-open cholecystectomy scar in right upper quadrant. Liver edge at costal margin. Low abdomen unremarkable. Extremities trace pedal edema. Skin no lesions. Neurological alert and oriented x 3. Psychological normal mood and affect.  Assessment and Plan:  Problems #1 nausea and vomiting. We need to rule out a gastric outlet obstruction, Motrin associated gastropathy or ulcer. We also need to rule out stress related dyspepsia or metabolic causes for nausea or vomiting such as a thyroid problems, hepatitis or low-grade pancreatitis. We will proceed with lab work including CBC, metabolic panel, amylase, lipase and TSH. We will proceed with an upper endoscopy to rule out a gastric ulcer or gastritis. We will check for H. pylori. She was treated for H. pylori in the past. We will increase Nexium to 40 mg by mouth twice a day.  Problem #2 adenomatous polyps. She is up-to-date on her colonoscopy which was in August 2009. Her next colonoscopy will be due in August 2014.   05/29/2010 Lina Sar

## 2010-05-30 ENCOUNTER — Encounter: Payer: Self-pay | Admitting: Internal Medicine

## 2010-05-30 ENCOUNTER — Ambulatory Visit (AMBULATORY_SURGERY_CENTER): Payer: 59 | Admitting: Internal Medicine

## 2010-05-30 VITALS — BP 145/70 | HR 68 | Temp 97.7°F | Resp 20 | Ht 67.0 in | Wt 248.0 lb

## 2010-05-30 DIAGNOSIS — R112 Nausea with vomiting, unspecified: Secondary | ICD-10-CM

## 2010-05-30 DIAGNOSIS — K2980 Duodenitis without bleeding: Secondary | ICD-10-CM

## 2010-05-30 DIAGNOSIS — R109 Unspecified abdominal pain: Secondary | ICD-10-CM

## 2010-05-30 LAB — TYPE AND SCREEN
ABO/RH(D): A POS
Antibody Screen: NEGATIVE

## 2010-05-30 LAB — COMPREHENSIVE METABOLIC PANEL
ALT: 16 U/L (ref 0–35)
AST: 18 U/L (ref 0–37)
Albumin: 3.5 g/dL (ref 3.5–5.2)
Alkaline Phosphatase: 52 U/L (ref 39–117)
BUN: 9 mg/dL (ref 6–23)
CO2: 28 mEq/L (ref 19–32)
Calcium: 8.9 mg/dL (ref 8.4–10.5)
Chloride: 108 mEq/L (ref 96–112)
Creatinine, Ser: 0.77 mg/dL (ref 0.4–1.2)
GFR calc Af Amer: >60 mL/min (ref >60)
GFR calc non Af Amer: >60 mL/min (ref >60)
Glucose, Bld: 83 mg/dL (ref 70–99)
Potassium: 4.2 mEq/L (ref 3.5–5.1)
Sodium: 141 mEq/L (ref 135–145)
Total Bilirubin: 0.6 mg/dL (ref 0.3–1.2)
Total Protein: 6.8 g/dL (ref 6.0–8.3)

## 2010-05-30 LAB — CBC
HCT: 27.6 % — ABNORMAL LOW (ref 36.0–46.0)
HCT: 30.1 % — ABNORMAL LOW (ref 36.0–46.0)
HCT: 30.4 % — ABNORMAL LOW (ref 36.0–46.0)
HCT: 34.4 % — ABNORMAL LOW (ref 36.0–46.0)
Hemoglobin: 10.2 g/dL — ABNORMAL LOW (ref 12.0–15.0)
Hemoglobin: 10.2 g/dL — ABNORMAL LOW (ref 12.0–15.0)
Hemoglobin: 11.4 g/dL — ABNORMAL LOW (ref 12.0–15.0)
Hemoglobin: 9.3 g/dL — ABNORMAL LOW (ref 12.0–15.0)
MCHC: 33.2 g/dL (ref 30.0–36.0)
MCHC: 33.6 g/dL (ref 30.0–36.0)
MCHC: 33.7 g/dL (ref 30.0–36.0)
MCHC: 33.8 g/dL (ref 30.0–36.0)
MCV: 96.1 fL (ref 78.0–100.0)
MCV: 96.1 fL (ref 78.0–100.0)
MCV: 96.8 fL (ref 78.0–100.0)
MCV: 97.3 fL (ref 78.0–100.0)
Platelets: 126 10*3/uL — ABNORMAL LOW (ref 150–400)
Platelets: 141 10*3/uL — ABNORMAL LOW (ref 150–400)
Platelets: 146 10*3/uL — ABNORMAL LOW (ref 150–400)
Platelets: 174 10*3/uL (ref 150–400)
RBC: 2.84 MIL/uL — ABNORMAL LOW (ref 3.87–5.11)
RBC: 3.13 MIL/uL — ABNORMAL LOW (ref 3.87–5.11)
RBC: 3.16 MIL/uL — ABNORMAL LOW (ref 3.87–5.11)
RBC: 3.55 MIL/uL — ABNORMAL LOW (ref 3.87–5.11)
RDW: 14.2 % (ref 11.5–15.5)
RDW: 14.6 % (ref 11.5–15.5)
RDW: 14.7 % (ref 11.5–15.5)
RDW: 15 % (ref 11.5–15.5)
WBC: 2.7 10*3/uL — ABNORMAL LOW (ref 4.0–10.5)
WBC: 5.3 10*3/uL (ref 4.0–10.5)
WBC: 8.2 10*3/uL (ref 4.0–10.5)
WBC: 9.6 10*3/uL (ref 4.0–10.5)

## 2010-05-30 LAB — URINALYSIS, ROUTINE W REFLEX MICROSCOPIC
Bilirubin Urine: NEGATIVE
Glucose, UA: NEGATIVE mg/dL
Ketones, ur: NEGATIVE mg/dL
Leukocytes, UA: NEGATIVE
Nitrite: NEGATIVE
Protein, ur: NEGATIVE mg/dL
Specific Gravity, Urine: 1.022 (ref 1.005–1.030)
Urobilinogen, UA: 1 mg/dL (ref 0.0–1.0)
pH: 6 (ref 5.0–8.0)

## 2010-05-30 LAB — BASIC METABOLIC PANEL
BUN: 5 mg/dL — ABNORMAL LOW (ref 6–23)
BUN: 5 mg/dL — ABNORMAL LOW (ref 6–23)
CO2: 23 mEq/L (ref 19–32)
CO2: 26 mEq/L (ref 19–32)
Calcium: 7.8 mg/dL — ABNORMAL LOW (ref 8.4–10.5)
Calcium: 8.3 mg/dL — ABNORMAL LOW (ref 8.4–10.5)
Chloride: 102 mEq/L (ref 96–112)
Chloride: 103 mEq/L (ref 96–112)
Creatinine, Ser: 0.77 mg/dL (ref 0.4–1.2)
Creatinine, Ser: 0.83 mg/dL (ref 0.4–1.2)
GFR calc Af Amer: >60 mL/min (ref >60)
GFR calc Af Amer: >60 mL/min (ref >60)
GFR calc non Af Amer: >60 mL/min (ref >60)
GFR calc non Af Amer: >60 mL/min (ref >60)
Glucose, Bld: 113 mg/dL — ABNORMAL HIGH (ref 70–99)
Glucose, Bld: 129 mg/dL — ABNORMAL HIGH (ref 70–99)
Potassium: 3.8 mEq/L (ref 3.5–5.1)
Potassium: 4 mEq/L (ref 3.5–5.1)
Sodium: 134 mEq/L — ABNORMAL LOW (ref 135–145)
Sodium: 137 mEq/L (ref 135–145)

## 2010-05-30 LAB — PROTIME-INR
INR: 1.05 (ref 0.00–1.49)
INR: 1.15 (ref 0.00–1.49)
INR: 1.63 — ABNORMAL HIGH (ref 0.00–1.49)
INR: 1.91 — ABNORMAL HIGH (ref 0.00–1.49)
Prothrombin Time: 13.6 seconds (ref 11.6–15.2)
Prothrombin Time: 14.6 seconds (ref 11.6–15.2)
Prothrombin Time: 19.2 seconds — ABNORMAL HIGH (ref 11.6–15.2)
Prothrombin Time: 21.7 seconds — ABNORMAL HIGH (ref 11.6–15.2)

## 2010-05-30 LAB — ABO/RH: ABO/RH(D): A POS

## 2010-05-30 LAB — URINE MICROSCOPIC-ADD ON

## 2010-05-30 LAB — APTT: aPTT: 30 seconds (ref 24–37)

## 2010-05-30 LAB — PREPARE RBC (CROSSMATCH)

## 2010-05-30 NOTE — Patient Instructions (Signed)
Discharge instructions given with verbal understanding. Handout given on a soft diet for today.

## 2010-05-31 ENCOUNTER — Telehealth: Payer: Self-pay | Admitting: *Deleted

## 2010-05-31 NOTE — Telephone Encounter (Signed)

## 2010-06-07 ENCOUNTER — Encounter: Payer: Self-pay | Admitting: Internal Medicine

## 2010-06-20 ENCOUNTER — Ambulatory Visit: Payer: Commercial Managed Care - PPO | Admitting: Family Medicine

## 2010-07-11 NOTE — Op Note (Signed)
Victoria [name], Victoria [name]               ACCOUNT NO.:  [id number]   MEDICAL RECORD NO.:  [id number]          PATIENT TYPE:  AMB   LOCATION:  DSC                          FACILITY:  [facility]   PHYSICIAN:  Lenn Sink, D.P.M.DATE OF BIRTH:  1960/07/01   DATE OF PROCEDURE:  02/17/2008  DATE OF DISCHARGE:  02/17/2008                               OPERATIVE REPORT   PREOPERATIVE DIAGNOSIS:  Hammer digit syndrome, digits 2 and 5  bilateral.   POSTOPERATIVE DIAGNOSIS:  Hammer digit syndrome, digits 2 and 5  bilateral.   ANESTHESIA:  IV sedation with local infiltration.   HEMOSTASIS:  Ankle tourniquets bilateral.   PROCEDURES PERFORMED:  Distal arthroplasty digit 2 bilateral, proximal  arthroplasty digit 5 bilateral.   INDICATIONS FOR SURGERY:  Chronic discomfort, keratotic lesion  formation, inability to wear shoe gear without difficulty.   SURGEON:  Lenn Sink, DPM   DESCRIPTION OF PROCEDURE:  1. We used a total of 20 mL of Xylocaine and Marcaine mixture      preoperatively.  The patient was placed on the table and prepped      and draped utilizing standard aseptic technique.  The right foot      was exsanguinated by utilizing Esmarch and the right ankle      tourniquet was inflated to 250 mmHg.  Following that procedure was      performed.  Attention was directed to dorsal aspect of the digit to      right distal where a 3-cm transverse incision was made.  The      incision was deepened through subcutaneous tissue at the level of      the interphalangeal joint and the intervening skin wedge was      removed in toto.  A transverse incision into the extensor expansion      was made at the level of the interphalangeal joint, and the medial      and lateral collateral ligaments were severed.  The head of the      middle phalanx was delivered off the wound and resected in toto.      The wound was flushed with copious amounts of sterile gentamicin      solution and was sutured  utilizing 5-0 nylon.  2. Arthroplasty of digit 5, right.  Attention was directed to dorsal      aspect of digit 5 right where a semielliptical oblique incision was      made, mid distal, medial to a proximal lateral direction.  The      intervening skin wedge was removed in toto, a transverse incision      was made into the extensor expansion at the level of the      interphalangeal joint and the head of the proximal phalanx of that      digit was delivered off the wound and resected in toto.  The wound      was flushed with copious amounts of gentamicin solution and sutured      utilizing 5-0 nylon.  Surgical sites on the right foot were  infiltrated with 1 mL of dexamethasone proximal to the incision      site and a sterile dressing was applied right.  Cap refil was noted      to be immediate to all digits on the right foot.  The left      tourniquet was then inflated after Esmarch application and the      final procedures were performed.  3. Distal arthroplasty of digit 2, left.  For all attempts and      purposes, this procedure was performed in an identical fashion of      the procedure #1 for the right foot.  4. Proximal arthroplasty of digit 5, left.  For all attempts and      purposes, this procedure was performed in an identical fashion of      the procedure #2 for the right foot.  Surgical sites on the left      foot were at this time      infiltrated with 1 mL of dexamethasone and a dry sterile      compression dressing applied to left foot.  The patient tolerated      both surgery and anesthesia well and was transferred out of the OR      in satisfactory condition, was given a prescription for Vicodin,      and was discharged by Anesthesia in good condition.           ______________________________  Lenn Sink, D.P.M.     NSR/MEDQ  D:  03/10/2008  T:  03/11/2008  Job:  [id number]

## 2010-07-14 ENCOUNTER — Other Ambulatory Visit: Payer: 59 | Admitting: *Deleted

## 2010-07-14 DIAGNOSIS — E039 Hypothyroidism, unspecified: Secondary | ICD-10-CM

## 2010-07-14 LAB — TSH: TSH: 0.732 u[IU]/mL (ref 0.350–4.500)

## 2010-07-14 NOTE — Op Note (Signed)
NAMEROSALY, LABARBERA                           ACCOUNT NO.:  [id number]   MEDICAL RECORD NO.:  [id number]                   PATIENT TYPE:  INP   LOCATION:  9304                                 FACILITY:  [facility]   PHYSICIAN:  Laqueta Linden, M.D.                 DATE OF BIRTH:  05/29/1960   DATE OF PROCEDURE:  08/25/2002  DATE OF DISCHARGE:                                 OPERATIVE REPORT   PREOPERATIVE DIAGNOSIS:  Symptomatic leiomyomata uteri.   POSTOPERATIVE DIAGNOSIS:  Symptomatic leiomyomata uteri.   PROCEDURE:  Total abdominal hysterectomy, placement of long Pugh system x2.   SURGEON:  Laqueta Linden, M.D.   ASSISTANT:  Edwena Felty. Romine, M.D.   ANESTHESIA:  General endotracheal anesthesia.   ESTIMATED BLOOD LOSS:  350 mL.   URINE OUTPUT:  300 mL.   FLUIDS REPLACED:  1700 mL Crystalloid.   COUNTS:  Correct x2.   COMPLICATIONS:  None.   INDICATIONS FOR PROCEDURE:  Victoria [name] is a 50 year old gravida 2, para 0-  1-1-2 black female with known fibroids who presents for definitive surgical  management.  She had a trial of Ortho-Evra with a decrease in her flow but  she complains of worsening pelvic pressure, urinary symptoms with pressure,  frequency as well as low back and lower abdominal pain.  She has been  assessed of the options of the procedure as well as the risks, benefits, and  complications including, but not limited to, anesthesia risks, infection,  bleeding possibly requiring transfusion, injury to bowel, bladder, ureters,  vessels or nerves, fistula formation with slightly increased risk of bladder  injury or fistula formation due to her primary cesarean section.  She is  also aware of risks of DVT, PE, pneumonia, death and other unnamed risks,  all of which are slightly increased due to her exogenous obesity. She  understands the increased risk for incisional infection and breakdown.  She  understands that this will make her permanently and irreversibly  sterilized.  Postoperative recovery expectations regarding return to normal activity,  sexual functioning and ensuing natural menopause with retention of the  ovaries have all been discussed at length.  Full consent is then given.  She  is aware that weighing menopausal shrinkage, continuing birth control patch  control of menses, and uterine artery embolization and/or uterine cavity  ablation are all alternatives as well.  She has signed the informed consent,  voiced understanding and acceptance of all risks and agreed to proceed.  She  had received Ancef 1 g antibiotic prophylaxis preoperatively.   DESCRIPTION OF PROCEDURE:  The patient was taken to the operating room and  after proper identification and consent, she was placed on the operating  table in supine position.  After the induction of general endotracheal  anesthesia, she was placed in the frog legged position and the abdomen,  perineum and vagina were prepped and draped  in routine sterile fashion.  A  transurethral Foley was placed.  She was returned to the supine position.  A  Pfannenstiel incision was then made.  This was actually made above the level  of her prior C-section incision as she had since developed a panniculus and  the old incision was right in the crease of the panniculus, it was felt that  this would deter adequate healing.  The transverse incision was carried down  to the level of the fascia.  The fascia was incised and the incision extends  superiorly, inferiorly, and laterally.  The rectus muscles were separated  and the parietal peritoneum elevated and incised.  The incision was extended  superiorly and inferiorly to the level of the bladder.  Palpation of the  upper abdomen revealed smooth renal contours bilaterally.  Some right upper  quadrant scarring consistent with her previous cholecystectomy.  A normal-  appearing appendix.  Both tubes and ovaries appeared normal, status post  tubal ligation.  The  uterus was distorted and grossly enlarged with multiple  serosal and intramural fibroids.  The bladder was adherent to the lower  uterine segment but the uterus was freely mobile.  The uterus was grasped at  the cornua and elevated in the operative field.  A self-retaining retractor  was placed and bowel packed in the upper abdomen with moistened lap packs.  The round ligaments were clamped, cut, and suture ligated bilaterally.  Dissection was carried forward anteriorly in the broad ligament with sharp  dissection of the bladder off of the lower uterine segment and cervix.  A  window in the posterior broad ligament was made and curved Heaney clamps  were placed across the proximal utero-ovarian ligament and fallopian tube  with retention of the tube and ovary on each side.  These pedicles were  doubly ligated with a free tie and a stitch of 0 Vicryl and were suspended  to the ipsilateral round ligament at the conclusion of the procedure.  Both  ureters were visualized to be of normal course and caliber and deep in the  pelvis well out of the operative field.  Successive clamps were then placed  across the uterine vessels bilaterally with pedicles cut and suture ligated.  Subsequent clamps were placed across the cardinal ligaments with pedicles  cut and suture ligated.  The upper vaginal angles were then grasped with the  uterosacral ligaments with entry into the upper vagina. The cervix was  circumscribed with the scalpel and handed off the table and sent for final  sectioning with the uterus.  At this point, Richardson angle sutures were  placed bilaterally with marked improvement in hemostasis.  The vaginal cuff  was then closed from front to back using figure-of-eight sutures of 0  Vicryl. Several small bleeding points were cauterized.  The uterosacral  ligaments were plicated posteriorly.  The urine remained clear and copious amounts throughout the procedure.  There was no obvious  bladder injury or  trauma during the procedure.  Copious lavage was accomplished.  Counts were  correct prior to closure of the peritoneum.  Lap packs and retractor had  been removed.  The parietal peritoneum was closed with a running stitch of 2-  0 Vicryl.  The rectus muscles were loosely reapproximated in the midline.  After subfascial hemostasis was ascertained, an on-Q catheter was placed  through the left mid quadrant into the subfascial space.  The fascia was  then closed from both lateral aspects of midline using  a stitch of 0 Maxon,  taking care not to suture in the on-Q catheter.  This was then loaded with  10 mL of 1% plain Xylocaine.  After fascial closure was obtained, the  subcutaneous hemostasis was ascertained and several small bleeding points  were cauterized.  A subcutaneous on-Q catheter was then placed through the  right mid quadrant along the length of the incision.  The incision was then  closed with staples.  Pressure dressing was applied.  The subcutaneous on-Q  catheter received a loading dose of 10 mL of 1% lidocaine also.  Both  catheters were fixed in place using Tegaderm.  A pressure dressing was  applied. The catheters were attached to the on-Q pump which delivered 2 mL  per catheter per hour of  0.5% plain Marcaine.  Peripad was applied.  The patient was extubated and  stable on transfer to the recovery room.  Estimated blood loss was 350 mL.  Urine output was 300 mL.  Fluids were 1700 mL of Crystalloid.  Counts  correct x2.  There were no complications.                                               Laqueta Linden, M.D.    LKS/MEDQ  D:  08/25/2002  T:  08/25/2002  Job:  [id number]   cc:   Luanna Cole. Lenord Fellers, M.D.  [address], [place]  [place]  [place] [address]  Fax: [phone]

## 2010-07-21 ENCOUNTER — Encounter: Payer: Self-pay | Admitting: Internal Medicine

## 2010-07-21 ENCOUNTER — Ambulatory Visit (INDEPENDENT_AMBULATORY_CARE_PROVIDER_SITE_OTHER): Payer: 59 | Admitting: Internal Medicine

## 2010-07-21 VITALS — BP 92/68 | HR 82 | Temp 98.1°F | Wt 248.0 lb

## 2010-07-21 DIAGNOSIS — E559 Vitamin D deficiency, unspecified: Secondary | ICD-10-CM

## 2010-07-21 DIAGNOSIS — K219 Gastro-esophageal reflux disease without esophagitis: Secondary | ICD-10-CM

## 2010-07-21 DIAGNOSIS — N6019 Diffuse cystic mastopathy of unspecified breast: Secondary | ICD-10-CM

## 2010-07-21 DIAGNOSIS — E890 Postprocedural hypothyroidism: Secondary | ICD-10-CM

## 2010-07-21 DIAGNOSIS — L732 Hidradenitis suppurativa: Secondary | ICD-10-CM

## 2010-07-21 DIAGNOSIS — E669 Obesity, unspecified: Secondary | ICD-10-CM

## 2010-07-21 DIAGNOSIS — E538 Deficiency of other specified B group vitamins: Secondary | ICD-10-CM

## 2010-07-21 DIAGNOSIS — E039 Hypothyroidism, unspecified: Secondary | ICD-10-CM

## 2010-07-21 NOTE — Patient Instructions (Signed)
Continue same meds as directed. Return in 6 months for CPE

## 2010-07-21 NOTE — Progress Notes (Signed)
  Subjective:    Patient ID: Victoria [name], female    DOB: 06/16/1960, 50 y.o.   MRN: [id number]  HPILast seen here Dec 2011. Hx hypothyroidism secondary to thyroidectomy in 1980,B12 deficiency, past history Fe def anemia secondary to menorrhagia, H.pylori in 2001, HSV Type 2, Saw urologist 2/12 for evaluation of microscopic hematuria.Renal ultrasound and cystoscopy negative. Saw Dr. Lina Sar 4/12 for evaluation Of 4 day hx of N & V. Nexium was increased to bid. Endoscopy showed no gastric outlet obstruction. Pancreatitis was ruled out. Also, hx of hidradenitis treated with Accutane. History of fibrocystic breast disease. Mammogram done August 2011. History of vitamin D deficiency. She gives herself a B12 injection on a monthly basis. Is obese and doesn't get much exercise. Works 3 jobs.    Review of Systems     Objective:   Physical Exam neck no thyromegaly   ; chest clear;  cardiac exam: regular rate and rhythm, normal S1/S2; Ext without edema        Assessment & Plan:  Hypothyroidism secondary to thyroidectomy in 1980  Obesity  B12 deficiency  History of H. pylori 2001 with recent abdominal pain nausea and vomiting. Gastric outlet obstruction ruled out, Nexium increased to twice daily for GE reflux.  Fibrocystic breast disease  History of vitamin D deficiency  History of hidradenitis  Plan is to return in 6 month for physical examination

## 2010-08-26 ENCOUNTER — Encounter: Payer: Self-pay | Admitting: Internal Medicine

## 2010-08-26 DIAGNOSIS — E559 Vitamin D deficiency, unspecified: Secondary | ICD-10-CM | POA: Insufficient documentation

## 2010-08-26 DIAGNOSIS — E890 Postprocedural hypothyroidism: Secondary | ICD-10-CM | POA: Insufficient documentation

## 2010-08-26 DIAGNOSIS — E538 Deficiency of other specified B group vitamins: Secondary | ICD-10-CM | POA: Insufficient documentation

## 2010-08-26 DIAGNOSIS — N6019 Diffuse cystic mastopathy of unspecified breast: Secondary | ICD-10-CM | POA: Insufficient documentation

## 2010-08-26 DIAGNOSIS — K219 Gastro-esophageal reflux disease without esophagitis: Secondary | ICD-10-CM | POA: Insufficient documentation

## 2010-09-04 ENCOUNTER — Other Ambulatory Visit: Payer: Self-pay | Admitting: Internal Medicine

## 2010-09-04 DIAGNOSIS — E538 Deficiency of other specified B group vitamins: Secondary | ICD-10-CM

## 2010-09-29 ENCOUNTER — Other Ambulatory Visit: Payer: Self-pay | Admitting: Internal Medicine

## 2010-10-17 ENCOUNTER — Other Ambulatory Visit: Payer: Self-pay | Admitting: Internal Medicine

## 2010-10-17 ENCOUNTER — Ambulatory Visit (HOSPITAL_COMMUNITY)
Admission: RE | Admit: 2010-10-17 | Discharge: 2010-10-17 | Disposition: A | Discharge status: Home / Self Care | Payer: 59 | Source: Ambulatory Visit | Attending: Internal Medicine | Admitting: Internal Medicine

## 2010-10-17 DIAGNOSIS — Z1231 Encounter for screening mammogram for malignant neoplasm of breast: Secondary | ICD-10-CM

## 2010-11-07 ENCOUNTER — Telehealth: Payer: Self-pay | Admitting: *Deleted

## 2010-11-07 DIAGNOSIS — R197 Diarrhea, unspecified: Secondary | ICD-10-CM

## 2010-11-07 MED ORDER — CIPROFLOXACIN HCL 250 MG PO TABS: 250.0000 mg | ORAL_TABLET | Freq: Two times a day (BID) | ORAL | Status: AC

## 2010-11-07 MED ORDER — METRONIDAZOLE 250 MG PO TABS: 250.0000 mg | ORAL_TABLET | Freq: Three times a day (TID) | ORAL | Status: DC

## 2010-11-07 NOTE — Telephone Encounter (Signed)
Dr Juanda Chance has called and would like patient to have labwork completed today (Stool culture and stool for CDiff) as well as some prescriptions sent to the pharmacy (Flagyl 250 tid #21 and cipro 250 bid #14). Rx has been sent to the pharmacy and orders have been entered in epic.

## 2010-11-08 ENCOUNTER — Other Ambulatory Visit: Payer: 59

## 2010-11-08 DIAGNOSIS — R197 Diarrhea, unspecified: Secondary | ICD-10-CM

## 2010-11-09 ENCOUNTER — Telehealth: Payer: Self-pay | Admitting: *Deleted

## 2010-11-09 LAB — CLOSTRIDIUM DIFFICILE BY PCR: Toxigenic C. Difficile by PCR: DETECTED — CR

## 2010-11-09 MED ORDER — METRONIDAZOLE 250 MG PO TABS: 250.0000 mg | ORAL_TABLET | Freq: Four times a day (QID) | ORAL | Status: AC

## 2010-11-09 NOTE — Telephone Encounter (Signed)
I agree

## 2010-11-09 NOTE — Telephone Encounter (Signed)
I have spoken to Dr Juanda Chance and have advised her that Loney Loh states Ms. Tellis' C Diff by PCR is positive. Dr Juanda Chance already has patient on Flagyl 250 tid at this time. However, she would like patient to increase to Flagyl 250 QID x 10 more days. I have also spoken to patient to advise her of positive C Diff. I have also asked her to be mindful that C Diff is contagious so she should make certain to wash hands thoroughly and use routine precautions. I have also advised her of the change in Flagyl directions. I will send in a new prescription for patient to [facility] pharmacy. Patient verbalizes understanding.

## 2010-11-12 LAB — STOOL CULTURE

## 2010-11-22 ENCOUNTER — Other Ambulatory Visit: Payer: Self-pay | Admitting: Internal Medicine

## 2010-11-22 MED ORDER — DICYCLOMINE HCL 20 MG PO TABS: 20.0000 mg | ORAL_TABLET | Freq: Two times a day (BID) | ORAL | Status: DC

## 2010-11-22 MED ORDER — METRONIDAZOLE 250 MG PO TABS: 250.0000 mg | ORAL_TABLET | Freq: Four times a day (QID) | ORAL | Status: AC

## 2010-11-28 ENCOUNTER — Ambulatory Visit: Payer: 59 | Admitting: Internal Medicine

## 2010-12-01 LAB — POCT HEMOGLOBIN-HEMACUE: Hemoglobin: 12.2 g/dL (ref 12.0–15.0)

## 2010-12-12 ENCOUNTER — Telehealth: Payer: Self-pay | Admitting: *Deleted

## 2010-12-12 DIAGNOSIS — R197 Diarrhea, unspecified: Secondary | ICD-10-CM

## 2010-12-12 NOTE — Telephone Encounter (Signed)
Please repeat stool C diff by PCR, if positive will give Vancomycin, if negative, will do flex sig.

## 2010-12-12 NOTE — Telephone Encounter (Signed)
Patient states that she has continued malodorus diarrhea after a full course of Flagyl. She states that she "still has antibiotics in the system" and has already had 3 diarrheal stools this a.m. Dr Juanda Chance, please advise.

## 2010-12-13 NOTE — Telephone Encounter (Signed)
Patient advised to go to lab to repeat Stool for C Diff. Patient verbalizes understanding.

## 2010-12-14 ENCOUNTER — Other Ambulatory Visit: Payer: 59

## 2010-12-14 DIAGNOSIS — R197 Diarrhea, unspecified: Secondary | ICD-10-CM

## 2010-12-15 ENCOUNTER — Telehealth: Payer: Self-pay | Admitting: *Deleted

## 2010-12-15 LAB — CLOSTRIDIUM DIFFICILE BY PCR: Toxigenic C. Difficile by PCR: DETECTED — CR

## 2010-12-15 NOTE — Telephone Encounter (Signed)
Spoke with Dr. Juanda Chance re: c.diff positive results. Per Dr. Juanda Chance patient need Vancomycin 250 mg QID x 14 days, then 250 mg TID x 1 week, then 250 mg BID x 1 weekk then 250 mg daily x 1 week. [facility] does not have the medication and cannot get it until Monday. [place] can try to have it tonight or tomorrow early AM. Will cost patient $50 copay x 2 for full rx.

## 2010-12-15 NOTE — Telephone Encounter (Signed)
Spoke with patient and gave her Dr. Regino Schultze recommendations. She will get her rx at [facility].

## 2010-12-22 ENCOUNTER — Other Ambulatory Visit: Payer: Self-pay | Admitting: Internal Medicine

## 2010-12-28 DIAGNOSIS — Z8619 Personal history of other infectious and parasitic diseases: Secondary | ICD-10-CM

## 2010-12-28 HISTORY — DX: Personal history of other infectious and parasitic diseases: Z86.19

## 2010-12-29 ENCOUNTER — Ambulatory Visit (INDEPENDENT_AMBULATORY_CARE_PROVIDER_SITE_OTHER): Payer: 59 | Admitting: Internal Medicine

## 2010-12-29 ENCOUNTER — Encounter: Payer: Self-pay | Admitting: Internal Medicine

## 2010-12-29 VITALS — BP 116/82 | HR 84 | Temp 98.1°F | Wt 259.0 lb

## 2010-12-29 DIAGNOSIS — A0472 Enterocolitis due to Clostridium difficile, not specified as recurrent: Secondary | ICD-10-CM

## 2010-12-29 DIAGNOSIS — J040 Acute laryngitis: Secondary | ICD-10-CM

## 2010-12-29 DIAGNOSIS — J40 Bronchitis, not specified as acute or chronic: Secondary | ICD-10-CM

## 2010-12-29 MED ORDER — METHYLPREDNISOLONE ACETATE 80 MG/ML IJ SUSP: 80.0000 mg | Freq: Once | INTRAMUSCULAR | Status: DC

## 2010-12-29 MED ORDER — METHYLPREDNISOLONE ACETATE PF 80 MG/ML IJ SUSP
80.0000 mg | Freq: Once | INTRAMUSCULAR | Status: AC
  Administered 2010-12-29: 80 mg via INTRAMUSCULAR

## 2010-12-29 NOTE — Progress Notes (Signed)
Addended by: Judy Pimple on: 12/29/2010 12:26 PM   Modules accepted: Orders

## 2010-12-29 NOTE — Patient Instructions (Signed)
Continue oral vancomycin. Take Zithromax Z-PAK 2 tablets by mouth day one followed by 1 tablet days 2 through 5. You have received Depo-Medrol 80 mg IM for respiratory congestion and inflammation. You have been given samples of Zutripro to take at bedtime for  cough for 5 days

## 2010-12-29 NOTE — Progress Notes (Signed)
  Subjective:    Patient ID: Victoria [name], female    DOB: 1960/07/09, 50 y.o.   MRN: [id number]  HPI 50 year old black female with history of hypothyroidism, B12 deficiency, recent diagnosis of Clostridium difficile diarrhea now on oral vancomycin with laryngitis for 4 days. Is speaking in a whisper. Coughing up discolored sputum. No fever.    Review of Systems     Objective:   Physical Exam pharynx slightly injected. TMs are clear; neck supple. Chest clear        Assessment & Plan:  Laryngitis  Bronchitis  C. difficile diarrhea  Plan: Zithromax Z-Pak take as directed. Zutripro samples i tsp at bedtime for 6 days for cough Depo-Medrol 80 mg IM. Continue with oral vancomycin for C. difficile diarrhea. Patient requesting Handicapped Parking sticker. This was provided.

## 2011-02-02 ENCOUNTER — Other Ambulatory Visit: Payer: Self-pay | Admitting: Internal Medicine

## 2011-02-02 MED ORDER — VANCOMYCIN HCL 250 MG PO CAPS: ORAL_CAPSULE | ORAL | Status: DC

## 2011-02-08 ENCOUNTER — Other Ambulatory Visit: Payer: 59 | Admitting: Internal Medicine

## 2011-02-09 ENCOUNTER — Other Ambulatory Visit: Payer: 59 | Admitting: Internal Medicine

## 2011-02-09 ENCOUNTER — Ambulatory Visit (INDEPENDENT_AMBULATORY_CARE_PROVIDER_SITE_OTHER): Payer: 59 | Admitting: Internal Medicine

## 2011-02-09 ENCOUNTER — Encounter: Payer: Self-pay | Admitting: Internal Medicine

## 2011-02-09 VITALS — BP 104/70 | HR 66 | Temp 98.1°F | Resp 18 | Wt 251.5 lb

## 2011-02-09 DIAGNOSIS — E66812 Obesity, class 2: Secondary | ICD-10-CM

## 2011-02-09 DIAGNOSIS — E890 Postprocedural hypothyroidism: Secondary | ICD-10-CM

## 2011-02-09 DIAGNOSIS — Z0000 Encounter for general adult medical examination without abnormal findings: Secondary | ICD-10-CM

## 2011-02-09 DIAGNOSIS — E669 Obesity, unspecified: Secondary | ICD-10-CM

## 2011-02-09 LAB — CBC WITH DIFFERENTIAL/PLATELET
Basophils Absolute: 0 10*3/uL (ref 0.0–0.1)
Basophils Relative: 0 % (ref 0–1)
Eosinophils Absolute: 0.1 10*3/uL (ref 0.0–0.7)
Eosinophils Relative: 1 % (ref 0–5)
HCT: 37.9 % (ref 36.0–46.0)
Hemoglobin: 12.7 g/dL (ref 12.0–15.0)
Lymphocytes Relative: 37 % (ref 12–46)
Lymphs Abs: 1.4 10*3/uL (ref 0.7–4.0)
MCH: 31.7 pg (ref 26.0–34.0)
MCHC: 33.5 g/dL (ref 30.0–36.0)
MCV: 94.5 fL (ref 78.0–100.0)
Monocytes Absolute: 0.3 10*3/uL (ref 0.1–1.0)
Monocytes Relative: 8 % (ref 3–12)
Neutro Abs: 2.1 10*3/uL (ref 1.7–7.7)
Neutrophils Relative %: 53 % (ref 43–77)
Platelets: 242 10*3/uL (ref 150–400)
RBC: 4.01 MIL/uL (ref 3.87–5.11)
RDW: 13.8 % (ref 11.5–15.5)
WBC: 3.9 10*3/uL — ABNORMAL LOW (ref 4.0–10.5)

## 2011-02-09 LAB — COMPREHENSIVE METABOLIC PANEL
ALT: 16 U/L (ref 0–35)
AST: 15 U/L (ref 0–37)
Albumin: 4.1 g/dL (ref 3.5–5.2)
Alkaline Phosphatase: 43 U/L (ref 39–117)
BUN: 15 mg/dL (ref 6–23)
CO2: 25 mEq/L (ref 19–32)
Calcium: 9.4 mg/dL (ref 8.4–10.5)
Chloride: 104 mEq/L (ref 96–112)
Creat: 0.81 mg/dL (ref 0.50–1.10)
Glucose, Bld: 82 mg/dL (ref 70–99)
Potassium: 4.8 mEq/L (ref 3.5–5.3)
Sodium: 138 mEq/L (ref 135–145)
Total Bilirubin: 0.5 mg/dL (ref 0.3–1.2)
Total Protein: 7.2 g/dL (ref 6.0–8.3)

## 2011-02-09 LAB — LIPID PANEL
Cholesterol: 173 mg/dL (ref 0–200)
HDL: 50 mg/dL (ref >39)
LDL Cholesterol: 114 mg/dL — ABNORMAL HIGH (ref 0–99)
Total CHOL/HDL Ratio: 3.5 Ratio
Triglycerides: 43 mg/dL (ref <150)
VLDL: 9 mg/dL (ref 0–40)

## 2011-02-09 LAB — TSH: TSH: 0.226 u[IU]/mL — ABNORMAL LOW (ref 0.350–4.500)

## 2011-02-10 LAB — VITAMIN D 25 HYDROXY (VIT D DEFICIENCY, FRACTURES): Vit D, 25-Hydroxy: 22 ng/mL — ABNORMAL LOW (ref 30–89)

## 2011-02-26 ENCOUNTER — Encounter: Payer: Self-pay | Admitting: Internal Medicine

## 2011-02-26 NOTE — Progress Notes (Signed)
  Subjective:    Patient ID: Victoria [name], female    DOB: Dec 29, 1960, 50 y.o.   MRN: [id number]  HPI 50 year old black femalele with history of obesity, hypothyroidism, hidradenitis, herpes simplex type II, fibrocystic breast disease, B12 deficiency, GE reflux, adenomatous colon polyp, vitamin D deficiency. Patient had thyroidectomy in 1980. Cholecystectomy 1987. C-section for twins 9. Bilateral tubal ligation 1980s. Total above we'll hysterectomy 2004. Left hip replacement December 2010. History of H. pylori 2001. Also recent history of C. difficile diarrhea. Received influenza immunization through her work. Doxycycline causes nausea. Patient gives herself B12 injections monthly.  Had colonoscopy 2009.  Social history she has twin daughters who are adults. Mother still living with history of diabetes and hypertension. Father living. Father with history of diabetes. 2 brothers and one sister. Sister with history of hypothyroidism. Patient is divorced. Does not smoke. Does not consume alcohol.    Review of Systems  Constitutional: Positive for fatigue.  HENT: Negative.   Eyes: Negative.   Respiratory: Negative.   Cardiovascular: Negative.   Gastrointestinal: Negative.   Genitourinary: Negative.   Musculoskeletal: Positive for back pain.  Neurological: Negative.   Hematological: Negative.   Psychiatric/Behavioral: Negative.        Objective:   Physical Exam  Vitals reviewed. Constitutional: She is oriented to person, place, and time. She appears well-nourished.  HENT:  Head: Normocephalic and atraumatic.  Right Ear: External ear normal.  Left Ear: External ear normal.  Mouth/Throat: Oropharynx is clear and moist.  Eyes: Conjunctivae and EOM are normal. Pupils are equal, round, and reactive to light. No scleral icterus.  Neck: Neck supple. No JVD present. No thyromegaly present.  Cardiovascular: Normal rate, regular rhythm, normal heart sounds and intact distal pulses.   No  murmur heard. Abdominal: Soft. Bowel sounds are normal. She exhibits mass. She exhibits no distension. There is no tenderness. There is no rebound and no guarding.  Genitourinary:       Deferred-history of total abdominal hysterectomy  Musculoskeletal: Normal range of motion. She exhibits no edema.  Lymphadenopathy:    She has no cervical adenopathy.  Neurological: She is alert and oriented to person, place, and time. She has normal reflexes. No cranial nerve deficit. Coordination normal.  Skin: No rash noted.  Psychiatric: She has a normal mood and affect. Her behavior is normal. Judgment and thought content normal.          Assessment & Plan:  Hypothyroidism-due to thyroidectomy 1990  B12 deficiency-treated with monthly B12 injections  History of H. pylori infection 2001  History of C. difficile diarrhea 2012-treated by Dr. Juanda Chance  History of vitamin D deficiency  History of HSV type II  Plan: Return in 6 months for office visit and TSH.  Hidradenitis suppurativa

## 2011-02-26 NOTE — Patient Instructions (Addendum)
Please continue same dose of Synthroid. Please try the diet exercise and lose weight. I know this is hard because you're working several jobs. Return in 6 months for office visit and thyroid check.

## 2011-03-22 ENCOUNTER — Other Ambulatory Visit: Payer: Self-pay | Admitting: Internal Medicine

## 2011-04-19 ENCOUNTER — Ambulatory Visit (INDEPENDENT_AMBULATORY_CARE_PROVIDER_SITE_OTHER): Payer: 59 | Admitting: Internal Medicine

## 2011-04-19 ENCOUNTER — Encounter: Payer: Self-pay | Admitting: Internal Medicine

## 2011-04-19 VITALS — BP 86/54 | HR 80 | Temp 98.6°F | Wt 250.0 lb

## 2011-04-19 DIAGNOSIS — J329 Chronic sinusitis, unspecified: Secondary | ICD-10-CM

## 2011-04-30 NOTE — Progress Notes (Signed)
  Subjective:    Patient ID: Victoria [name], female    DOB: [birth year], 51 y.o.   MRN: [id number]  HPI is come daily with URI symptoms cough and congestion. Has been symptomatic for 3 or 4 days. History of hypothyroidism, B12 deficiency obesity, GE reflux and recent C. difficile diarrhea treated by Dr. Juanda Chance. Patient has discolored nasal drainage and sinus congestion. Has malaise and fatigue.     Review of Systems     Objective:   Physical Exam HEENT exam: TMs are slightly full; pharynx is clear; neck is supple; chest clear. Has boggy nasal mucosa.        Assessment & Plan:  Sinusitis  History of C. difficile colitis  Plan: Patient has discolored nasal drainage and is symptomatic. I think we can proceed with a Zithromax Z-Pak 2 tablets day one followed by 1 tablet by mouth days 2 through 5.

## 2011-05-20 NOTE — Patient Instructions (Signed)
Take Zithromax Z-Pak as directed. 

## 2011-06-18 ENCOUNTER — Other Ambulatory Visit: Payer: Self-pay | Admitting: Internal Medicine

## 2011-07-09 ENCOUNTER — Telehealth: Payer: Self-pay | Admitting: *Deleted

## 2011-07-09 DIAGNOSIS — Z8619 Personal history of other infectious and parasitic diseases: Secondary | ICD-10-CM

## 2011-07-09 DIAGNOSIS — R197 Diarrhea, unspecified: Secondary | ICD-10-CM

## 2011-07-09 NOTE — Telephone Encounter (Signed)
Patient called complaining of diarrhea which started several days ago. She has had abdominal cramping and describes it to be what sounds like the epigastric area. She states that there has been no blood in the stool, no fever. There has been some nausea as well as some malodorous stool. She states that Saturday, she was okay and had no loose stool. Sunday, the diarrhea started back and today she has not had any loose stool although she has not eaten anything yet. Upon questioning, she states that she has not taken any recent antibiotics. However, she has been on weight watchers and has increased her fiber intake. I advised her that some of her symptoms may be related to the increased fiber intake but that Dr Juanda Chance may want to do stool studies etc. due to her recent history of C Diff. Dr Amanda Pea advise.

## 2011-07-09 NOTE — Telephone Encounter (Signed)
Patient has been advised that she should come by the lab for stool for C Diff container should she continue to have loose stools. I have also advised her to begin taking her Bentyl twice daily again (patient thinks she already has this at home but will call if she needs more).  Patient verbalizes understanding.

## 2011-07-09 NOTE — Telephone Encounter (Signed)
If she has a next loose stool, send it for C.Diff by PCR. Please start her on Bentyl 10mg , #30, po bid- I think it is IBS.

## 2011-07-11 ENCOUNTER — Other Ambulatory Visit: Payer: 59

## 2011-07-11 DIAGNOSIS — Z8619 Personal history of other infectious and parasitic diseases: Secondary | ICD-10-CM

## 2011-07-11 DIAGNOSIS — R197 Diarrhea, unspecified: Secondary | ICD-10-CM

## 2011-07-12 LAB — CLOSTRIDIUM DIFFICILE BY PCR: Toxigenic C. Difficile by PCR: NOT DETECTED

## 2011-07-19 ENCOUNTER — Other Ambulatory Visit (INDEPENDENT_AMBULATORY_CARE_PROVIDER_SITE_OTHER): Payer: 59

## 2011-07-19 ENCOUNTER — Telehealth: Payer: Self-pay | Admitting: *Deleted

## 2011-07-19 DIAGNOSIS — E039 Hypothyroidism, unspecified: Secondary | ICD-10-CM

## 2011-07-19 DIAGNOSIS — D510 Vitamin B12 deficiency anemia due to intrinsic factor deficiency: Secondary | ICD-10-CM

## 2011-07-19 DIAGNOSIS — Z8619 Personal history of other infectious and parasitic diseases: Secondary | ICD-10-CM

## 2011-07-19 DIAGNOSIS — R197 Diarrhea, unspecified: Secondary | ICD-10-CM

## 2011-07-19 LAB — CBC WITH DIFFERENTIAL/PLATELET
Basophils Absolute: 0 10*3/uL (ref 0.0–0.1)
Basophils Relative: 0.3 % (ref 0.0–3.0)
Eosinophils Absolute: 0.1 10*3/uL (ref 0.0–0.7)
Eosinophils Relative: 2.7 % (ref 0.0–5.0)
HCT: 36.5 % (ref 36.0–46.0)
Hemoglobin: 12 g/dL (ref 12.0–15.0)
Lymphocytes Relative: 31.4 % (ref 12.0–46.0)
Lymphs Abs: 1.4 10*3/uL (ref 0.7–4.0)
MCHC: 32.9 g/dL (ref 30.0–36.0)
MCV: 99.2 fl (ref 78.0–100.0)
Monocytes Absolute: 0.4 10*3/uL (ref 0.1–1.0)
Monocytes Relative: 9.7 % (ref 3.0–12.0)
Neutro Abs: 2.4 10*3/uL (ref 1.4–7.7)
Neutrophils Relative %: 55.9 % (ref 43.0–77.0)
Platelets: 179 10*3/uL (ref 150.0–400.0)
RBC: 3.68 Mil/uL — ABNORMAL LOW (ref 3.87–5.11)
RDW: 13.6 % (ref 11.5–14.6)
WBC: 4.3 10*3/uL — ABNORMAL LOW (ref 4.5–10.5)

## 2011-07-19 LAB — IBC PANEL
Iron: 43 ug/dL (ref 42–145)
Saturation Ratios: 14.2 % — ABNORMAL LOW (ref 20.0–50.0)
Transferrin: 216 mg/dL (ref 212.0–360.0)

## 2011-07-19 LAB — SEDIMENTATION RATE: Sed Rate: 34 mm/hr — ABNORMAL HIGH (ref 0–22)

## 2011-07-19 LAB — BUN: BUN: 13 mg/dL (ref 6–23)

## 2011-07-19 LAB — VITAMIN B12: Vitamin B-12: 253 pg/mL (ref 211–911)

## 2011-07-19 LAB — TSH: TSH: 0.73 u[IU]/mL (ref 0.35–5.50)

## 2011-07-19 LAB — CREATININE, SERUM: Creatinine, Ser: 1 mg/dL (ref 0.4–1.2)

## 2011-07-19 NOTE — Telephone Encounter (Signed)
Per Dr Juanda Chance, patient has been scheduled for a CT scan of the abdomen and pelvis as well as for labwork. I have given patient instructions.

## 2011-07-20 ENCOUNTER — Ambulatory Visit (INDEPENDENT_AMBULATORY_CARE_PROVIDER_SITE_OTHER)
Admission: RE | Admit: 2011-07-20 | Discharge: 2011-07-20 | Disposition: A | Discharge status: Home / Self Care | Payer: 59 | Source: Ambulatory Visit | Attending: Internal Medicine | Admitting: Internal Medicine

## 2011-07-20 DIAGNOSIS — R197 Diarrhea, unspecified: Secondary | ICD-10-CM

## 2011-07-20 DIAGNOSIS — E039 Hypothyroidism, unspecified: Secondary | ICD-10-CM

## 2011-07-20 DIAGNOSIS — D510 Vitamin B12 deficiency anemia due to intrinsic factor deficiency: Secondary | ICD-10-CM

## 2011-07-20 DIAGNOSIS — Z8619 Personal history of other infectious and parasitic diseases: Secondary | ICD-10-CM

## 2011-07-20 MED ORDER — IOHEXOL 300 MG/ML  SOLN: 75.0000 mL | Freq: Once | INTRAMUSCULAR | Status: AC | PRN

## 2011-07-20 MED ORDER — IOHEXOL 300 MG/ML  SOLN
100.0000 mL | Freq: Once | INTRAMUSCULAR | Status: AC | PRN
  Administered 2011-07-20: 100 mL via INTRAVENOUS

## 2011-08-10 ENCOUNTER — Ambulatory Visit (INDEPENDENT_AMBULATORY_CARE_PROVIDER_SITE_OTHER): Payer: 59 | Admitting: Internal Medicine

## 2011-08-10 ENCOUNTER — Encounter: Payer: Self-pay | Admitting: Internal Medicine

## 2011-08-10 VITALS — BP 108/68 | HR 68 | Temp 97.3°F | Wt 245.0 lb

## 2011-08-10 DIAGNOSIS — Z8619 Personal history of other infectious and parasitic diseases: Secondary | ICD-10-CM

## 2011-08-12 DIAGNOSIS — Z8619 Personal history of other infectious and parasitic diseases: Secondary | ICD-10-CM | POA: Insufficient documentation

## 2011-08-12 NOTE — Progress Notes (Signed)
  Subjective:    Patient ID: Victoria [name], female    DOB: 11/25/1960, 51 y.o.   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 She has 2 adult daughters. She has a history of obesity,hidradenitis, hypothyroidism, vitamin D 12 deficiency, C. difficile diarrhea, osteoarthritis. Says she may be facing right hip replacement in the near future. Hip hurts a great deal. She has no pain medication. Dr. Juanda Chance saw her recently for gastroenterology issues surrounding C. difficile diarrhea. She told her to take 2 vitamin B12 injections monthly instead of 1. This would bolster her vitamin B12 levels which were low normal. Patient administers B12 injections  to herself. She remains on Synthroid with compliance.    Review of Systems     Objective:   Physical Exam alert and oriented x3; affect is appropriate. Neck is supple without thyromegaly. Chest clear to auscultation. Cardiac exam regular rate and rhythm normal S1 and S2. Abdomen is benign. Has crepitus in her knees. Ambulates slowly.        Assessment & Plan:  Vitamin B 12 deficiency-will take 2 vitamin B12 injections monthly  Osteoarthritis-patient looking at right hip replacement in the near future. Have prescribed Vicodin 5/500 ( #60) one every 12 hours as needed for osteoarthritis pain with refills  Hypothyroidism-thyroid functions normal continue same dose of Synthroid and recheck in 6 months at time of physical exam  History of C. difficile diarrhea  Obesity-patient says she cannot exercise due to to osteoarthritis issues  Plan: Return in 6 months for physical exam.

## 2011-08-12 NOTE — Patient Instructions (Addendum)
Give to B12 injections monthly. Continue same dose of Synthroid. Return in 6 months for physical exam. Have prescribed Vicodin to take sparingly for osteoarthritis pain.

## 2011-09-19 ENCOUNTER — Other Ambulatory Visit: Payer: Self-pay | Admitting: Internal Medicine

## 2011-10-11 ENCOUNTER — Ambulatory Visit (INDEPENDENT_AMBULATORY_CARE_PROVIDER_SITE_OTHER): Payer: 59 | Admitting: Internal Medicine

## 2011-10-11 ENCOUNTER — Encounter: Payer: Self-pay | Admitting: Internal Medicine

## 2011-10-11 VITALS — BP 98/60 | HR 76 | Temp 97.2°F | Ht 67.0 in | Wt 250.0 lb

## 2011-10-11 DIAGNOSIS — W57XXXA Bitten or stung by nonvenomous insect and other nonvenomous arthropods, initial encounter: Secondary | ICD-10-CM

## 2011-10-11 DIAGNOSIS — E039 Hypothyroidism, unspecified: Secondary | ICD-10-CM

## 2011-10-11 DIAGNOSIS — M1610 Unilateral primary osteoarthritis, unspecified hip: Secondary | ICD-10-CM

## 2011-10-11 DIAGNOSIS — M169 Osteoarthritis of hip, unspecified: Secondary | ICD-10-CM

## 2011-10-11 DIAGNOSIS — E669 Obesity, unspecified: Secondary | ICD-10-CM

## 2011-10-11 NOTE — Progress Notes (Signed)
  Subjective:    Patient ID: Victoria [name], female    DOB: Jun 18, 1960, 51 y.o.   MRN: [id number]  HPI 51 year old black female with history of osteoarthritis. Says Dr. Despina Hick told her recently her hip was getting worse and she needs to consider hip replacement. Unable to exercise because of pain in her hip. Patient disturbed with her weight. Has tried various weight loss initiatives. Has tried Weight Watchers, has tried [facility], saw dietitian at [facility]. Has considered bariatric surgery but has some hesitation about it because a coworker has had problems after gastric bypass surgery. Doesn't think she wants to do that either. Has been to bariatric surgery seminars but is ambivalent about proceeding with surgery. Also says she has had carbuncle right inner buttock. Doesn't want to take antibiotics because of history of Clostridium difficile infection. History of hypothyroidism on thyroid replacement therapy.  Says insect bites occurred while she was outdoors late one evening.    Review of Systems     Objective:   Physical Exam multiple insect bites on left arm, chest and right arm. Insect bites on left arm have surrounding erythema but no evidence of secondary infection Appear to be mosquito bites. Healing carbuncle right inner buttock.  Spent 15 minutes talking with patient about weight loss issues and options        Assessment & Plan:  Osteoarthritis of hip  Multiple insect bites  Obesity  Hypothyroidism  Plan: Cyclocort cream 0.1% 60 g to use on insect bites 3 times a day until healed. Would consider having hip replacement before having any bariatric surgery. If she could exercise she could perhaps lose some weight. Recommend 1500-calorie diet

## 2011-10-11 NOTE — Patient Instructions (Addendum)
Consider having hip replacement. Try 1500-calorie diet. Apply Cyclocort 0.1% cream to insect bites 3 times daily

## 2011-11-05 ENCOUNTER — Other Ambulatory Visit: Payer: Self-pay | Admitting: Internal Medicine

## 2011-11-05 DIAGNOSIS — Z1231 Encounter for screening mammogram for malignant neoplasm of breast: Secondary | ICD-10-CM

## 2011-11-27 ENCOUNTER — Ambulatory Visit (HOSPITAL_COMMUNITY)
Admission: RE | Admit: 2011-11-27 | Discharge: 2011-11-27 | Disposition: A | Discharge status: Home / Self Care | Payer: 59 | Source: Ambulatory Visit | Attending: Internal Medicine | Admitting: Internal Medicine

## 2011-11-27 DIAGNOSIS — Z1231 Encounter for screening mammogram for malignant neoplasm of breast: Secondary | ICD-10-CM | POA: Insufficient documentation

## 2012-01-10 ENCOUNTER — Ambulatory Visit (INDEPENDENT_AMBULATORY_CARE_PROVIDER_SITE_OTHER): Payer: 59 | Admitting: Internal Medicine

## 2012-01-10 ENCOUNTER — Encounter: Payer: Self-pay | Admitting: Internal Medicine

## 2012-01-10 VITALS — BP 104/70 | HR 72 | Temp 97.6°F | Wt 259.0 lb

## 2012-01-10 DIAGNOSIS — J069 Acute upper respiratory infection, unspecified: Secondary | ICD-10-CM

## 2012-01-10 DIAGNOSIS — H6690 Otitis media, unspecified, unspecified ear: Secondary | ICD-10-CM

## 2012-01-10 DIAGNOSIS — H6693 Otitis media, unspecified, bilateral: Secondary | ICD-10-CM

## 2012-01-10 DIAGNOSIS — J329 Chronic sinusitis, unspecified: Secondary | ICD-10-CM

## 2012-01-10 MED ORDER — METHYLPREDNISOLONE ACETATE 80 MG/ML IJ SUSP
80.0000 mg | Freq: Once | INTRAMUSCULAR | Status: AC
  Administered 2012-01-10: 80 mg via INTRAMUSCULAR

## 2012-01-10 NOTE — Patient Instructions (Addendum)
Take Zithromax Z-Pak 2 tablets day one followed by 1 tablet days 2 through 5. Take Hycodan 1 teaspoon every 6-8 hours as needed for cough. You have been given an injection of Depo-Medrol 80 mg IM today in the office.

## 2012-01-10 NOTE — Progress Notes (Signed)
  Subjective:    Patient ID: Victoria [name], female    DOB: 1960-11-27, 51 y.o.   MRN: [id number]  HPI 51 year old black female with history of hypothyroidism and B12 deficiency had onset of URI symptoms 2 weeks ago. She had a prescription for Zithromax Z-PAK which she had filled and took around 2 weeks ago. However she has not gotten better. Has had some discolored sputum production, malaise and fatigue. Coughing quite a bit. Tired of being sick.    Review of Systems     Objective:   Physical Exam HEENT exam: TMs are full and pink bilaterally. Left TM is centrally injected. Pharynx slightly injected without exudate. Neck is supple without significant adenopathy. Chest clear to auscultation.        Assessment & Plan:  Bilateral otitis media  URI acute  Plan: Zithromax Z-Pak take 2 tablets day one followed by 1 tablet days 2 through 5 with 1 refill. Depo-Medrol 80 mg IM. Hycodan 8 ounces 1 teaspoon by mouth every 6 hours when necessary cough.

## 2012-02-07 ENCOUNTER — Other Ambulatory Visit: Payer: Self-pay | Admitting: Internal Medicine

## 2012-02-08 ENCOUNTER — Other Ambulatory Visit: Payer: 59 | Admitting: Internal Medicine

## 2012-02-11 ENCOUNTER — Ambulatory Visit (INDEPENDENT_AMBULATORY_CARE_PROVIDER_SITE_OTHER): Payer: 59 | Admitting: Internal Medicine

## 2012-02-11 ENCOUNTER — Encounter: Payer: Self-pay | Admitting: Internal Medicine

## 2012-02-11 ENCOUNTER — Other Ambulatory Visit: Payer: Self-pay | Admitting: Internal Medicine

## 2012-02-11 VITALS — BP 128/86 | HR 70 | Temp 98.1°F | Ht 66.25 in | Wt 264.0 lb

## 2012-02-11 DIAGNOSIS — Z124 Encounter for screening for malignant neoplasm of cervix: Secondary | ICD-10-CM

## 2012-02-11 DIAGNOSIS — E538 Deficiency of other specified B group vitamins: Secondary | ICD-10-CM

## 2012-02-11 DIAGNOSIS — Z01818 Encounter for other preprocedural examination: Secondary | ICD-10-CM

## 2012-02-11 DIAGNOSIS — E669 Obesity, unspecified: Secondary | ICD-10-CM

## 2012-02-11 DIAGNOSIS — E039 Hypothyroidism, unspecified: Secondary | ICD-10-CM

## 2012-02-11 DIAGNOSIS — Z0000 Encounter for general adult medical examination without abnormal findings: Secondary | ICD-10-CM

## 2012-02-11 DIAGNOSIS — M1990 Unspecified osteoarthritis, unspecified site: Secondary | ICD-10-CM

## 2012-02-11 LAB — CBC WITH DIFFERENTIAL/PLATELET
Basophils Absolute: 0 10*3/uL (ref 0.0–0.1)
Basophils Relative: 0 % (ref 0–1)
Eosinophils Absolute: 0.1 10*3/uL (ref 0.0–0.7)
Eosinophils Relative: 2 % (ref 0–5)
HCT: 34.6 % — ABNORMAL LOW (ref 36.0–46.0)
Hemoglobin: 11.5 g/dL — ABNORMAL LOW (ref 12.0–15.0)
Lymphocytes Relative: 38 % (ref 12–46)
Lymphs Abs: 1.5 10*3/uL (ref 0.7–4.0)
MCH: 31.5 pg (ref 26.0–34.0)
MCHC: 33.2 g/dL (ref 30.0–36.0)
MCV: 94.8 fL (ref 78.0–100.0)
Monocytes Absolute: 0.2 10*3/uL (ref 0.1–1.0)
Monocytes Relative: 6 % (ref 3–12)
Neutro Abs: 2.3 10*3/uL (ref 1.7–7.7)
Neutrophils Relative %: 54 % (ref 43–77)
Platelets: 209 10*3/uL (ref 150–400)
RBC: 3.65 MIL/uL — ABNORMAL LOW (ref 3.87–5.11)
RDW: 13.7 % (ref 11.5–15.5)
WBC: 4.1 10*3/uL (ref 4.0–10.5)

## 2012-02-11 LAB — LIPID PANEL
Cholesterol: 173 mg/dL (ref 0–200)
HDL: 75 mg/dL (ref >39)
LDL Cholesterol: 91 mg/dL (ref 0–99)
Total CHOL/HDL Ratio: 2.3 Ratio
Triglycerides: 35 mg/dL (ref <150)
VLDL: 7 mg/dL (ref 0–40)

## 2012-02-11 LAB — COMPREHENSIVE METABOLIC PANEL
ALT: 14 U/L (ref 0–35)
AST: 13 U/L (ref 0–37)
Albumin: 4.1 g/dL (ref 3.5–5.2)
Alkaline Phosphatase: 48 U/L (ref 39–117)
BUN: 15 mg/dL (ref 6–23)
CO2: 23 mEq/L (ref 19–32)
Calcium: 8.9 mg/dL (ref 8.4–10.5)
Chloride: 109 mEq/L (ref 96–112)
Creat: 0.81 mg/dL (ref 0.50–1.10)
Glucose, Bld: 74 mg/dL (ref 70–99)
Potassium: 3.8 mEq/L (ref 3.5–5.3)
Sodium: 141 mEq/L (ref 135–145)
Total Bilirubin: 0.5 mg/dL (ref 0.3–1.2)
Total Protein: 7.1 g/dL (ref 6.0–8.3)

## 2012-02-11 LAB — POCT URINALYSIS DIPSTICK
Bilirubin, UA: NEGATIVE
Glucose, UA: NEGATIVE
Ketones, UA: NEGATIVE
Leukocytes, UA: NEGATIVE
Nitrite, UA: NEGATIVE
Protein, UA: NEGATIVE
Spec Grav, UA: >=1.03
Urobilinogen, UA: NEGATIVE
pH, UA: 5.5

## 2012-02-11 LAB — TSH: TSH: 0.543 u[IU]/mL (ref 0.350–4.500)

## 2012-02-11 LAB — VITAMIN B12: Vitamin B-12: >2000 pg/mL — ABNORMAL HIGH (ref 211–911)

## 2012-02-11 NOTE — Patient Instructions (Addendum)
Return in 6 months

## 2012-02-12 ENCOUNTER — Other Ambulatory Visit: Payer: Self-pay

## 2012-02-12 ENCOUNTER — Other Ambulatory Visit (HOSPITAL_COMMUNITY)
Admission: RE | Admit: 2012-02-12 | Discharge: 2012-02-12 | Disposition: A | Discharge status: Home / Self Care | Payer: 59 | Source: Ambulatory Visit | Attending: Internal Medicine | Admitting: Internal Medicine

## 2012-02-12 DIAGNOSIS — Z01419 Encounter for gynecological examination (general) (routine) without abnormal findings: Secondary | ICD-10-CM | POA: Insufficient documentation

## 2012-02-12 LAB — FOLLICLE STIMULATING HORMONE: FSH: 57.6 m[IU]/mL

## 2012-02-12 LAB — VITAMIN D 25 HYDROXY (VIT D DEFICIENCY, FRACTURES): Vit D, 25-Hydroxy: 18 ng/mL — ABNORMAL LOW (ref 30–89)

## 2012-02-12 MED ORDER — VITAMIN D3 1.25 MG (50000 UT) PO CAPS: 1.0000 | ORAL_CAPSULE | ORAL | Status: DC

## 2012-02-12 NOTE — Progress Notes (Signed)
Left message to call.

## 2012-02-12 NOTE — Progress Notes (Signed)
Patient informed. Rx for Vitamin D3 50,000 units weekly sent to [facility]. Patient also inquiring about a  Test for menopause because she is waking up with night sweats. FSH added to labs

## 2012-03-20 ENCOUNTER — Other Ambulatory Visit: Payer: Self-pay | Admitting: Internal Medicine

## 2012-04-15 ENCOUNTER — Other Ambulatory Visit: Payer: Self-pay | Admitting: Orthopedic Surgery

## 2012-04-15 MED ORDER — DEXAMETHASONE SODIUM PHOSPHATE 10 MG/ML IJ SOLN: 10.0000 mg | Freq: Once | INTRAMUSCULAR | Status: DC

## 2012-04-15 MED ORDER — BUPIVACAINE LIPOSOME 1.3 % IJ SUSP: 20.0000 mL | Freq: Once | INTRAMUSCULAR | Status: DC

## 2012-04-15 NOTE — Progress Notes (Signed)
Preoperative surgical orders have been place into the Epic hospital system for Victoria [name] on 04/15/2012, 12:14 PM  by Patrica Duel for surgery on 05/21/12.  Preop Total Hip orders including Experel Injecion, IV Tylenol, and IV Decadron as long as there are no contraindications to the above medications. Avel Peace, PA-C

## 2012-04-25 DIAGNOSIS — J069 Acute upper respiratory infection, unspecified: Secondary | ICD-10-CM

## 2012-04-26 ENCOUNTER — Encounter: Payer: Self-pay | Admitting: Internal Medicine

## 2012-04-26 ENCOUNTER — Telehealth: Payer: Self-pay | Admitting: Internal Medicine

## 2012-04-26 NOTE — Progress Notes (Signed)
Subjective:    Patient ID: Victoria [name], female    DOB: June 06, 1960, 52 y.o.   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 History of hypothyroidism and B12 deficiency.  Patient says she's going to have hip replacement surgery in the near future by Dr. Despina Hick. She is thinking about having this done in the spring. She remains overweight. Unable to lose weight. Works some at [employer] and also with Dr. Loreta Ave. Doesn't really have time to exercise. She is considered to weight  reduction surgery. She's not sure what type she would like to have. Says she will attend some seminars in the near future. Has been treated in the past by Dr. Juanda Chance for C. difficile diarrhea. History of hidradenitis supprativa.  Patient had thyroidectomy 1980. Cholecystectomy 1987. History of adenomatous colon polyp. C-section for twins 87. Bilateral tubal ligation in the 1980s. Total abdominal hysterectomy 2004. Left hip replacement December 2010. History of H. pylori infection 2001. Received influenza immunization through employment.  Patient gives herself monthly B12 injections. Doxycycline causes nausea. Had colonoscopy 2009.  Family history: She has twin daughters who are adults. Mother still living with history of diabetes and hypertension. Father living and has history of diabetes. 2 brothers and one sister. Sister has hypothyroidism. 3 grandchildren  Social history: Divorced. Does not smoke or consume alcohol.    Review of Systems  Constitutional: Positive for fatigue.  HENT: Negative.   Eyes: Negative.   Respiratory: Negative.   Cardiovascular: Negative.   Gastrointestinal: Negative.   Endocrine: Negative.   Genitourinary: Negative.   Allergic/Immunologic: Negative.   Neurological: Negative.   Hematological: Negative.   Psychiatric/Behavioral: Negative.         Objective:   Physical Exam  Vitals reviewed. Constitutional: She is oriented to person, place, and time. She appears well-developed and well-nourished. No distress.  HENT:  Head: Normocephalic and atraumatic.  Right Ear: External ear normal.  Left Ear: External ear normal.  Mouth/Throat: Oropharynx is clear and moist. No oropharyngeal exudate.  Eyes: Conjunctivae and EOM are normal. Pupils are equal, round, and reactive to light. Right eye exhibits no discharge. Left eye exhibits no discharge. No scleral icterus.  Neck: Neck supple. No JVD present. No thyromegaly present.  Cardiovascular: Normal rate, regular rhythm, normal heart sounds and intact distal pulses.   No murmur heard. Pulmonary/Chest: Effort normal and breath sounds normal. She has no wheezes. She has no rales. She exhibits no tenderness.  Breasts normal  Abdominal: Soft. Bowel sounds are normal. She exhibits no distension and no mass. There is no tenderness. There is no rebound and no guarding.  Genitourinary:  Pap taken. No masses  Musculoskeletal: She exhibits no edema.  Lymphadenopathy:    She has no cervical adenopathy.  Neurological: She is alert and oriented to person, place, and time. She has normal reflexes. No cranial nerve deficit. Coordination normal.  Skin: Skin is warm and dry. No rash noted. She is not diaphoretic.  Psychiatric: She has a normal mood and affect. Her behavior is normal. Judgment and thought content normal.            Assessment & Plan:  Hypothyroidism  Osteoarthritis of hip  Obesity  History of B12 deficiency  History of C. difficile diarrhea  Plan: Continue thyroid replacement therapy and monthly B12 injections which she gives herself. Return in 6 months . Expected hip replacement Spring 2014. She will  investigate weight reduction surgery.

## 2012-04-26 NOTE — Telephone Encounter (Signed)
Patient having hip replacement surgery March 26. Has come down with URI and cannot get over it. Prescription for Levaquin 500 milligrams daily for 7 days. Usually takes Zithromax Z-PAK. She had a refill on her previous Zithromax prescription and took that but did not get better

## 2012-04-29 ENCOUNTER — Other Ambulatory Visit: Payer: Self-pay | Admitting: Orthopedic Surgery

## 2012-04-29 MED ORDER — TRANEXAMIC ACID 100 MG/ML IV SOLN: 1000.0000 mg | INTRAVENOUS | Status: DC

## 2012-05-08 ENCOUNTER — Encounter (HOSPITAL_COMMUNITY): Payer: Self-pay

## 2012-05-08 ENCOUNTER — Ambulatory Visit (HOSPITAL_COMMUNITY)
Admission: RE | Admit: 2012-05-08 | Discharge: 2012-05-08 | Disposition: A | Discharge status: Home / Self Care | Payer: 59 | Source: Ambulatory Visit | Attending: Orthopedic Surgery | Admitting: Orthopedic Surgery

## 2012-05-08 ENCOUNTER — Encounter (HOSPITAL_COMMUNITY)
Admission: RE | Admit: 2012-05-08 | Discharge: 2012-05-08 | Disposition: A | Discharge status: Home / Self Care | Payer: 59 | Source: Ambulatory Visit | Attending: Orthopedic Surgery | Admitting: Orthopedic Surgery

## 2012-05-08 DIAGNOSIS — Z01818 Encounter for other preprocedural examination: Secondary | ICD-10-CM | POA: Insufficient documentation

## 2012-05-08 DIAGNOSIS — Z01812 Encounter for preprocedural laboratory examination: Secondary | ICD-10-CM | POA: Insufficient documentation

## 2012-05-08 DIAGNOSIS — Z96649 Presence of unspecified artificial hip joint: Secondary | ICD-10-CM | POA: Insufficient documentation

## 2012-05-08 HISTORY — DX: Unspecified hemorrhoids: K64.9

## 2012-05-08 HISTORY — DX: Asymptomatic varicose veins of unspecified lower extremity: I83.90

## 2012-05-08 LAB — PROTIME-INR
INR: 1.04 (ref 0.00–1.49)
Prothrombin Time: 13.5 seconds (ref 11.6–15.2)

## 2012-05-08 LAB — CBC
HCT: 34.9 % — ABNORMAL LOW (ref 36.0–46.0)
Hemoglobin: 11.4 g/dL — ABNORMAL LOW (ref 12.0–15.0)
MCH: 30.5 pg (ref 26.0–34.0)
MCHC: 32.7 g/dL (ref 30.0–36.0)
MCV: 93.3 fL (ref 78.0–100.0)
Platelets: 194 10*3/uL (ref 150–400)
RBC: 3.74 MIL/uL — ABNORMAL LOW (ref 3.87–5.11)
RDW: 14.2 % (ref 11.5–15.5)
WBC: 3.6 10*3/uL — ABNORMAL LOW (ref 4.0–10.5)

## 2012-05-08 LAB — URINALYSIS, ROUTINE W REFLEX MICROSCOPIC
Bilirubin Urine: NEGATIVE
Glucose, UA: NEGATIVE mg/dL
Ketones, ur: NEGATIVE mg/dL
Leukocytes, UA: NEGATIVE
Nitrite: NEGATIVE
Protein, ur: NEGATIVE mg/dL
Specific Gravity, Urine: 1.024 (ref 1.005–1.030)
Urobilinogen, UA: 0.2 mg/dL (ref 0.0–1.0)
pH: 6 (ref 5.0–8.0)

## 2012-05-08 LAB — COMPREHENSIVE METABOLIC PANEL
ALT: 16 U/L (ref 0–35)
AST: 22 U/L (ref 0–37)
Albumin: 3.6 g/dL (ref 3.5–5.2)
Alkaline Phosphatase: 50 U/L (ref 39–117)
BUN: 11 mg/dL (ref 6–23)
CO2: 29 mEq/L (ref 19–32)
Calcium: 9 mg/dL (ref 8.4–10.5)
Chloride: 103 mEq/L (ref 96–112)
Creatinine, Ser: 0.8 mg/dL (ref 0.50–1.10)
GFR calc Af Amer: >90 mL/min (ref >90)
GFR calc non Af Amer: 84 mL/min — ABNORMAL LOW (ref >90)
Glucose, Bld: 98 mg/dL (ref 70–99)
Potassium: 4.4 mEq/L (ref 3.5–5.1)
Sodium: 139 mEq/L (ref 135–145)
Total Bilirubin: 0.3 mg/dL (ref 0.3–1.2)
Total Protein: 7.9 g/dL (ref 6.0–8.3)

## 2012-05-08 LAB — URINE MICROSCOPIC-ADD ON

## 2012-05-08 LAB — SURGICAL PCR SCREEN
MRSA, PCR: NEGATIVE
Staphylococcus aureus: NEGATIVE

## 2012-05-08 LAB — APTT: aPTT: 35 seconds (ref 24–37)

## 2012-05-08 NOTE — Progress Notes (Signed)
Surgery clearance note 02/14/12 Dr. Lenord Fellers on chart, CT abdomen 07/20/11 [facility], EKG 02/11/12 [facility]

## 2012-05-08 NOTE — Patient Instructions (Addendum)
20 DESTA BUJAK  05/08/2012   Your procedure is scheduled on: 05/21/12  Report to [facility] at 1215 PM.  Call this number if you have problems the morning of surgery [phone]: [phone]   Remember:   Do not eat food After Midnight, clear liquids from midnight until 0845 am on 3/26/1 then nothing     Take these medicines the morning of surgery with A SIP OF WATER: synthroid, nexium   Do not wear jewelry, make-up or nail polish.  Do not wear lotions, powders, or perfumes. You may wear deodorant.  Do not shave 48 hours prior to surgery. Men may shave face and neck.  Do not bring valuables to the hospital.  Contacts, dentures or bridgework may not be worn into surgery.  Leave suitcase in the car. After surgery it may be brought to your room.  For patients admitted to the hospital, checkout time is 11:00 AM the day of discharge.    Please read over the following fact sheets that you were given: MRSA Information, blood fact sheet, incentive spirometer fact sheet, clear liquids fact sheet Birdie Sons, RN  pre op nurse call if needed [phone]    FAILURE TO FOLLOW THESE INSTRUCTIONS MAY RESULT IN CANCELLATION OF YOUR SURGERY   Patient Signature: ___________________________________________

## 2012-05-09 ENCOUNTER — Encounter (HOSPITAL_COMMUNITY): Payer: Self-pay | Admitting: Pharmacy Technician

## 2012-05-20 ENCOUNTER — Other Ambulatory Visit: Payer: Self-pay | Admitting: Orthopedic Surgery

## 2012-05-20 NOTE — H&P (Signed)
Victoria [name]  DOB: 12/23/1960 Divorced / Language: English / Race: Black or African American Female  Date of Admission:  05/21/2012  Chief Complaint:  Right Hip Pain  History of Present Illness The patient is a 51 year old female who comes in for a preoperative History and Physical. The patient is scheduled for a right total hip arthroplasty to be performed by Dr. Frank V. [name], MD at [facility] on 05/21/2012. The patient is a 51 year old female who presents for follow up of their hip. The patient is being followed for their right hip pain and osteoarthritis. Symptoms reported today include: pain and stiffness. The patient feels that they are doing poorly and report their pain level to be moderate to severe. The following medication has been used for pain control: antiinflammatory medication (ibuprofen 800mg) and Hydrocodone (for severe pain). Her right hip is at a stage now where she can't tolerate it anymore. Pain is as bad as the left one was prior to her left hip surgery. She states that the pain is even going down to her knee. She's not having any lower extremity weakness or paresthesias with it. Occasionally she will get some pain in the left hip, mainly lateral and in the buttock. She is ready to proceed with surgery. They have been treated conservatively in the past for the above stated problem and despite conservative measures, they continue to have progressive pain and severe functional limitations and dysfunction. They have failed non-operative management including home exercise, medications. It is felt that they would benefit from undergoing total joint replacement. Risks and benefits of the procedure have been discussed with the patient and they elect to proceed with surgery. There are no active contraindications to surgery such as ongoing infection or rapidly progressive neurological disease.     Problem List Osteoarthritis, Hip (715.35) S/P total hip  arthroplasty (V43.64)   Allergies No Known Drug Allergies   Family History Heart Disease. father Diabetes Mellitus. father and sister father, sister and grandfather mothers side Hypertension. mother and father mother, father and sister   Social History Illicit drug use. no Exercise. Exercises rarely Marital status. divorced Living situation. live with partner Current work status. working full time Children. 2 Copy of Drug/Alcohol Rehab (Previously). no Drug/Alcohol Rehab (Currently). no Pain Contract. no Number of flights of stairs before winded. 1 Tobacco use. never smoker Alcohol use. former drinker current drinker; only occasionally per week Post-Surgical Plans. Plan is to go home.   Medication History Ibuprofen (800MG Tablet, Oral) Active. Aspirin EC (81MG Tablet DR, Oral) Active. Synthroid ( Oral) Specific dose unknown - Active. NexIUM (40MG Capsule DR, Oral) Active. Vitamin D (50000UNIT Capsule, Oral) Active.   Past Surgical History Gallbladder Surgery. open Hysterectomy. partial (non-cancerous) partial (cancerous) Thyroidectomy; Total Colon Polyp Removal - Colonoscopy Cesarean Delivery. 1 time Total Hip Replacement. left Tubal Ligation Bilateral Lasix Surgery. Date: 2008. EGD   Medical History Migraine Headache Gastroesophageal Reflux Disease Hypothyroidism Osteoarthritis Anemia Varicose veins Clostridium difficile infection (041.84). 2013 following two courses of antibiotics  Review of Systems General:Not Present- Chills, Fever, Night Sweats, Fatigue, Weight Gain, Weight Loss and Memory Loss. Skin:Not Present- Hives, Itching, Rash, Eczema and Lesions. HEENT:Not Present- Tinnitus, Headache, Double Vision, Visual Loss, Hearing Loss and Dentures. Respiratory:Present- Cough (recent URI and taking Levaquin prior to surgery (will have completed before admission).). Not Present- Shortness of breath with exertion,  Shortness of breath at rest, Allergies, Coughing up blood and Chronic Cough. Cardiovascular:Not Present- Chest Pain, Racing/skipping heartbeats,   Difficulty Breathing Lying Down, Murmur, Swelling and Palpitations. Gastrointestinal:Not Present- Bloody Stool, Heartburn, Abdominal Pain, Vomiting, Nausea, Constipation, Diarrhea, Difficulty Swallowing, Jaundice and Loss of appetitie. Female Genitourinary:Not Present- Blood in Urine, Urinary frequency, Weak urinary stream, Discharge, Flank Pain, Incontinence, Painful Urination, Urgency, Urinary Retention and Urinating at Night. Musculoskeletal:Present- Joint Pain:  Not Present -Muscle Weakness, Muscle Pain, Joint Swelling, Back Pain, Morning Stiffness and Spasms. Neurological:Not Present- Tremor, Dizziness, Blackout spells, Paralysis, Difficulty with balance and Weakness. Psychiatric:Not Present- Insomnia.   Vitals Weight: 258 lb Height: 67 in Height was reported by patient. Body Surface Area: 2.35 m Body Mass Index: 40.41 kg/m Pulse: 60 (Regular) Resp.: 12 (Unlabored) BP: 118/78 (Sitting, Right Arm, Standard)    Physical Exam The physical exam findings are as follows:   General Mental Status - Alert, cooperative and good historian. General Appearance- pleasant. Not in acute distress. Orientation- Oriented X3. Build & Nutrition- Well nourished and Well developed.   Head and Neck Head- normocephalic, atraumatic . Neck Global Assessment- supple. no bruit auscultated on the right and no bruit auscultated on the left.   Eye Pupil- Bilateral- Regular and Round. Motion- Bilateral- EOMI.   Chest and Lung Exam Auscultation: Breath sounds:- clear at anterior chest wall and - clear at posterior chest wall. Adventitious sounds:- No Adventitious sounds.   Cardiovascular Auscultation:Rhythm- Regular rate and rhythm. Heart Sounds- S1 WNL and S2 WNL. Murmurs & Other Heart Sounds:Auscultation of the  heart reveals - No Murmurs.   Abdomen Palpation/Percussion:Tenderness- Abdomen is non-tender to palpation. Rigidity (guarding)- Abdomen is soft. Auscultation:Auscultation of the abdomen reveals - Bowel sounds normal.   Female Genitourinary  Not done, not pertinent to present illness  Musculoskeletal On exam, she's alert and oriented, in no apparent distress. Left hip can be flexed to 110, rotated in 30, out 40, abducted 40 without discomfort. There is no trochanteric tenderness. Right hip flexion 90. No internal rotation. About 20 external rotation, 20 abduction. Right knee has no effusion. Range is 0-125. There is minimal crepitus on ROM. No joint line tenderness or instability noted.  RADIOGRAPHS: AP pelvis and AP and lateral of both hips show the prosthesis on the left in excellent position with no periprosthetic abnormalities. On the right she has bone on bone arthritis of the hip with subchondral cyst formation and flattening of the femoral head. Her knees, AP of both and a lateral, show no arthritis, acute or chronic bony abnormalities.  Assessment & Plan Osteoarthritis, Hip (715.35) Impression: Right Hip  Note: Plan is for a Right Total Hip Replacement by Dr. [name].  Plan is to go home following the hospital stay.  PCP - Dr. Mary John Baxley - Patient has been seen preoperatively and felt to be stable for surgery.  Signed electronically by DREW L PERKINS, PA-C  

## 2012-05-21 ENCOUNTER — Encounter (HOSPITAL_COMMUNITY)
Admission: RE | Disposition: A | Discharge status: Home Health | Payer: Self-pay | Source: Ambulatory Visit | Attending: Orthopedic Surgery

## 2012-05-21 ENCOUNTER — Inpatient Hospital Stay (HOSPITAL_COMMUNITY): Payer: 59

## 2012-05-21 ENCOUNTER — Inpatient Hospital Stay (HOSPITAL_COMMUNITY)
Admission: RE | Admit: 2012-05-21 | Discharge: 2012-05-23 | DRG: 470 | Disposition: A | Discharge status: Home Health | Payer: 59 | Source: Ambulatory Visit | Attending: Orthopedic Surgery | Admitting: Orthopedic Surgery

## 2012-05-21 ENCOUNTER — Encounter (HOSPITAL_COMMUNITY): Payer: Self-pay | Admitting: Anesthesiology

## 2012-05-21 ENCOUNTER — Ambulatory Visit (HOSPITAL_COMMUNITY): Payer: 59 | Admitting: Anesthesiology

## 2012-05-21 ENCOUNTER — Encounter (HOSPITAL_COMMUNITY): Payer: Self-pay | Admitting: *Deleted

## 2012-05-21 DIAGNOSIS — M169 Osteoarthritis of hip, unspecified: Secondary | ICD-10-CM

## 2012-05-21 DIAGNOSIS — K219 Gastro-esophageal reflux disease without esophagitis: Secondary | ICD-10-CM | POA: Diagnosis present

## 2012-05-21 DIAGNOSIS — Z6841 Body Mass Index (BMI) 40.0 and over, adult: Secondary | ICD-10-CM

## 2012-05-21 DIAGNOSIS — E871 Hypo-osmolality and hyponatremia: Secondary | ICD-10-CM

## 2012-05-21 DIAGNOSIS — E039 Hypothyroidism, unspecified: Secondary | ICD-10-CM | POA: Diagnosis present

## 2012-05-21 DIAGNOSIS — Z96649 Presence of unspecified artificial hip joint: Secondary | ICD-10-CM

## 2012-05-21 DIAGNOSIS — E669 Obesity, unspecified: Secondary | ICD-10-CM | POA: Diagnosis present

## 2012-05-21 DIAGNOSIS — M1610 Unilateral primary osteoarthritis, unspecified hip: Principal | ICD-10-CM | POA: Diagnosis present

## 2012-05-21 HISTORY — PX: TOTAL HIP ARTHROPLASTY: SHX124

## 2012-05-21 LAB — TYPE AND SCREEN
ABO/RH(D): A POS
Antibody Screen: NEGATIVE

## 2012-05-21 SURGERY — ARTHROPLASTY, HIP, TOTAL,POSTERIOR APPROACH
Anesthesia: General | Site: Hip | Laterality: Right | Wound class: Clean

## 2012-05-21 MED ORDER — TRAMADOL HCL 50 MG PO TABS: 50.0000 mg | ORAL_TABLET | Freq: Four times a day (QID) | ORAL | Status: DC | PRN

## 2012-05-21 MED ORDER — MENTHOL 3 MG MT LOZG
1.0000 | LOZENGE | OROMUCOSAL | Status: DC | PRN
  Filled 2012-05-21: qty 9

## 2012-05-21 MED ORDER — CEFAZOLIN SODIUM-DEXTROSE 2-3 GM-% IV SOLR
2.0000 g | Freq: Four times a day (QID) | INTRAVENOUS | Status: AC
  Administered 2012-05-21 – 2012-05-22 (×2): 2 g via INTRAVENOUS
  Filled 2012-05-21 (×2): qty 50

## 2012-05-21 MED ORDER — LIDOCAINE HCL (CARDIAC) 20 MG/ML IV SOLN
INTRAVENOUS | Status: DC | PRN
  Administered 2012-05-21: 50 mg via INTRAVENOUS

## 2012-05-21 MED ORDER — FLEET ENEMA 7-19 GM/118ML RE ENEM: 1.0000 | ENEMA | Freq: Once | RECTAL | Status: AC | PRN

## 2012-05-21 MED ORDER — PROPOFOL 10 MG/ML IV EMUL
INTRAVENOUS | Status: DC | PRN
  Administered 2012-05-21: 200 mg via INTRAVENOUS

## 2012-05-21 MED ORDER — LACTATED RINGERS IV SOLN
INTRAVENOUS | Status: DC
  Administered 2012-05-21: 1000 mL via INTRAVENOUS
  Administered 2012-05-21 (×2): via INTRAVENOUS

## 2012-05-21 MED ORDER — DEXAMETHASONE 6 MG PO TABS
10.0000 mg | ORAL_TABLET | Freq: Once | ORAL | Status: AC
  Administered 2012-05-22: 10 mg via ORAL
  Filled 2012-05-21: qty 1

## 2012-05-21 MED ORDER — HYDROMORPHONE HCL PF 1 MG/ML IJ SOLN
INTRAMUSCULAR | Status: AC
  Filled 2012-05-21: qty 1

## 2012-05-21 MED ORDER — POLYETHYLENE GLYCOL 3350 17 G PO PACK: 17.0000 g | PACK | Freq: Every day | ORAL | Status: DC | PRN

## 2012-05-21 MED ORDER — METHOCARBAMOL 100 MG/ML IJ SOLN
500.0000 mg | Freq: Four times a day (QID) | INTRAVENOUS | Status: DC | PRN
  Filled 2012-05-21: qty 5

## 2012-05-21 MED ORDER — ACETAMINOPHEN 10 MG/ML IV SOLN
1000.0000 mg | Freq: Four times a day (QID) | INTRAVENOUS | Status: AC
  Administered 2012-05-21 – 2012-05-22 (×3): 1000 mg via INTRAVENOUS
  Filled 2012-05-21 (×6): qty 100

## 2012-05-21 MED ORDER — ACETAMINOPHEN 650 MG RE SUPP: 650.0000 mg | Freq: Four times a day (QID) | RECTAL | Status: DC | PRN

## 2012-05-21 MED ORDER — HYDROMORPHONE HCL PF 1 MG/ML IJ SOLN
INTRAMUSCULAR | Status: DC | PRN
  Administered 2012-05-21: 0.5 mg via INTRAVENOUS
  Administered 2012-05-21: 1 mg via INTRAVENOUS
  Administered 2012-05-21: 0.5 mg via INTRAVENOUS

## 2012-05-21 MED ORDER — METOCLOPRAMIDE HCL 10 MG PO TABS: 5.0000 mg | ORAL_TABLET | Freq: Three times a day (TID) | ORAL | Status: DC | PRN

## 2012-05-21 MED ORDER — DEXTROSE-NACL 5-0.9 % IV SOLN
INTRAVENOUS | Status: DC
  Administered 2012-05-22: via INTRAVENOUS

## 2012-05-21 MED ORDER — DEXAMETHASONE SODIUM PHOSPHATE 10 MG/ML IJ SOLN
INTRAMUSCULAR | Status: DC | PRN
  Administered 2012-05-21: 5 mg via INTRAVENOUS

## 2012-05-21 MED ORDER — STERILE WATER FOR IRRIGATION IR SOLN
Status: DC | PRN
  Administered 2012-05-21: 3000 mL

## 2012-05-21 MED ORDER — FENTANYL CITRATE 0.05 MG/ML IJ SOLN
INTRAMUSCULAR | Status: DC | PRN
  Administered 2012-05-21 (×2): 100 ug via INTRAVENOUS
  Administered 2012-05-21: 50 ug via INTRAVENOUS

## 2012-05-21 MED ORDER — MIDAZOLAM HCL 5 MG/5ML IJ SOLN
INTRAMUSCULAR | Status: DC | PRN
  Administered 2012-05-21: 2 mg via INTRAVENOUS

## 2012-05-21 MED ORDER — ONDANSETRON HCL 4 MG/2ML IJ SOLN
4.0000 mg | Freq: Four times a day (QID) | INTRAMUSCULAR | Status: DC | PRN
  Administered 2012-05-22: 4 mg via INTRAVENOUS
  Filled 2012-05-21: qty 2

## 2012-05-21 MED ORDER — MORPHINE SULFATE 2 MG/ML IJ SOLN
1.0000 mg | INTRAMUSCULAR | Status: DC | PRN
  Administered 2012-05-22: 1 mg via INTRAVENOUS
  Filled 2012-05-21: qty 1

## 2012-05-21 MED ORDER — ONDANSETRON HCL 4 MG PO TABS: 4.0000 mg | ORAL_TABLET | Freq: Four times a day (QID) | ORAL | Status: DC | PRN

## 2012-05-21 MED ORDER — METHOCARBAMOL 500 MG PO TABS
500.0000 mg | ORAL_TABLET | Freq: Four times a day (QID) | ORAL | Status: DC | PRN
  Administered 2012-05-22 – 2012-05-23 (×5): 500 mg via ORAL
  Filled 2012-05-21 (×5): qty 1

## 2012-05-21 MED ORDER — CEFAZOLIN SODIUM-DEXTROSE 2-3 GM-% IV SOLR
2.0000 g | INTRAVENOUS | Status: AC
  Administered 2012-05-21: 2 g via INTRAVENOUS

## 2012-05-21 MED ORDER — DIPHENHYDRAMINE HCL 12.5 MG/5ML PO ELIX
12.5000 mg | ORAL_SOLUTION | ORAL | Status: DC | PRN
  Administered 2012-05-23: 12.5 mg via ORAL
  Administered 2012-05-23: 25 mg via ORAL
  Filled 2012-05-21: qty 10
  Filled 2012-05-21: qty 5

## 2012-05-21 MED ORDER — METOCLOPRAMIDE HCL 5 MG/ML IJ SOLN: 5.0000 mg | Freq: Three times a day (TID) | INTRAMUSCULAR | Status: DC | PRN

## 2012-05-21 MED ORDER — PHENOL 1.4 % MT LIQD: 1.0000 | OROMUCOSAL | Status: DC | PRN

## 2012-05-21 MED ORDER — ROCURONIUM BROMIDE 100 MG/10ML IV SOLN
INTRAVENOUS | Status: DC | PRN
  Administered 2012-05-21: 50 mg via INTRAVENOUS

## 2012-05-21 MED ORDER — 0.9 % SODIUM CHLORIDE (POUR BTL) OPTIME
TOPICAL | Status: DC | PRN
  Administered 2012-05-21: 1000 mL

## 2012-05-21 MED ORDER — CEFAZOLIN SODIUM-DEXTROSE 2-3 GM-% IV SOLR
INTRAVENOUS | Status: AC
  Filled 2012-05-21: qty 50

## 2012-05-21 MED ORDER — ACETAMINOPHEN 325 MG PO TABS: 650.0000 mg | ORAL_TABLET | Freq: Four times a day (QID) | ORAL | Status: DC | PRN

## 2012-05-21 MED ORDER — NEOSTIGMINE METHYLSULFATE 1 MG/ML IJ SOLN
INTRAMUSCULAR | Status: DC | PRN
  Administered 2012-05-21: 3 mg via INTRAVENOUS

## 2012-05-21 MED ORDER — ACETAMINOPHEN 10 MG/ML IV SOLN
INTRAVENOUS | Status: AC
  Filled 2012-05-21: qty 100

## 2012-05-21 MED ORDER — DOCUSATE SODIUM 100 MG PO CAPS
100.0000 mg | ORAL_CAPSULE | Freq: Two times a day (BID) | ORAL | Status: DC
  Administered 2012-05-21 – 2012-05-23 (×4): 100 mg via ORAL

## 2012-05-21 MED ORDER — ONDANSETRON HCL 4 MG/2ML IJ SOLN
INTRAMUSCULAR | Status: DC | PRN
  Administered 2012-05-21: 4 mg via INTRAVENOUS

## 2012-05-21 MED ORDER — SODIUM CHLORIDE 0.9 % IV SOLN: INTRAVENOUS | Status: DC

## 2012-05-21 MED ORDER — LEVOTHYROXINE SODIUM 150 MCG PO TABS
150.0000 ug | ORAL_TABLET | Freq: Every day | ORAL | Status: DC
  Administered 2012-05-22 – 2012-05-23 (×2): 150 ug via ORAL
  Filled 2012-05-21 (×4): qty 1

## 2012-05-21 MED ORDER — PROMETHAZINE HCL 25 MG/ML IJ SOLN: 6.2500 mg | INTRAMUSCULAR | Status: DC | PRN

## 2012-05-21 MED ORDER — DEXAMETHASONE SODIUM PHOSPHATE 10 MG/ML IJ SOLN: 10.0000 mg | Freq: Once | INTRAMUSCULAR | Status: AC

## 2012-05-21 MED ORDER — ACETAMINOPHEN 10 MG/ML IV SOLN
1000.0000 mg | Freq: Once | INTRAVENOUS | Status: AC
  Administered 2012-05-21: 1000 mg via INTRAVENOUS

## 2012-05-21 MED ORDER — BUPIVACAINE LIPOSOME 1.3 % IJ SUSP
20.0000 mL | Freq: Once | INTRAMUSCULAR | Status: DC
  Filled 2012-05-21: qty 20

## 2012-05-21 MED ORDER — OXYCODONE HCL 5 MG PO TABS
5.0000 mg | ORAL_TABLET | ORAL | Status: DC | PRN
  Administered 2012-05-21 – 2012-05-23 (×12): 10 mg via ORAL
  Filled 2012-05-21 (×12): qty 2

## 2012-05-21 MED ORDER — PANTOPRAZOLE SODIUM 40 MG PO TBEC
80.0000 mg | DELAYED_RELEASE_TABLET | Freq: Every day | ORAL | Status: DC
  Filled 2012-05-21: qty 2

## 2012-05-21 MED ORDER — RIVAROXABAN 10 MG PO TABS
10.0000 mg | ORAL_TABLET | Freq: Every day | ORAL | Status: DC
  Administered 2012-05-22 – 2012-05-23 (×2): 10 mg via ORAL
  Filled 2012-05-21 (×3): qty 1

## 2012-05-21 MED ORDER — SODIUM CHLORIDE 0.9 % IJ SOLN
INTRAMUSCULAR | Status: DC | PRN
  Administered 2012-05-21: 16:00:00

## 2012-05-21 MED ORDER — HYDROMORPHONE HCL PF 1 MG/ML IJ SOLN
0.2500 mg | INTRAMUSCULAR | Status: DC | PRN
  Administered 2012-05-21 (×3): 0.5 mg via INTRAVENOUS

## 2012-05-21 MED ORDER — BISACODYL 10 MG RE SUPP: 10.0000 mg | Freq: Every day | RECTAL | Status: DC | PRN

## 2012-05-21 MED ORDER — GLYCOPYRROLATE 0.2 MG/ML IJ SOLN
INTRAMUSCULAR | Status: DC | PRN
  Administered 2012-05-21: .4 mg via INTRAVENOUS

## 2012-05-21 MED ORDER — METOCLOPRAMIDE HCL 5 MG/ML IJ SOLN
INTRAMUSCULAR | Status: DC | PRN
  Administered 2012-05-21: 10 mg via INTRAVENOUS

## 2012-05-21 SURGICAL SUPPLY — 51 items
BAG SPEC THK2 15X12 ZIP CLS (MISCELLANEOUS) ×1
BAG ZIPLOCK 12X15 (MISCELLANEOUS) ×2 IMPLANT
BIT DRILL 2.8X128 (BIT) ×2 IMPLANT
BLADE EXTENDED COATED 6.5IN (ELECTRODE) ×2 IMPLANT
BLADE SAW SAG 73X25 THK (BLADE) ×1
BLADE SAW SGTL 73X25 THK (BLADE) ×1 IMPLANT
CLOTH BEACON ORANGE TIMEOUT ST (SAFETY) ×2 IMPLANT
DECANTER SPIKE VIAL GLASS SM (MISCELLANEOUS) ×2 IMPLANT
DRAPE INCISE IOBAN 66X45 STRL (DRAPES) ×2 IMPLANT
DRAPE ORTHO SPLIT 77X108 STRL (DRAPES) ×4
DRAPE POUCH INSTRU U-SHP 10X18 (DRAPES) ×2 IMPLANT
DRAPE SURG ORHT 6 SPLT 77X108 (DRAPES) ×2 IMPLANT
DRAPE U-SHAPE 47X51 STRL (DRAPES) ×2 IMPLANT
DRSG ADAPTIC 3X8 NADH LF (GAUZE/BANDAGES/DRESSINGS) ×2 IMPLANT
DRSG MEPILEX BORDER 4X12 (GAUZE/BANDAGES/DRESSINGS) ×1 IMPLANT
DRSG MEPILEX BORDER 4X4 (GAUZE/BANDAGES/DRESSINGS) ×2 IMPLANT
DRSG MEPILEX BORDER 4X8 (GAUZE/BANDAGES/DRESSINGS) ×2 IMPLANT
DURAPREP 26ML APPLICATOR (WOUND CARE) ×2 IMPLANT
ELECT REM PT RETURN 9FT ADLT (ELECTROSURGICAL) ×2
ELECTRODE REM PT RTRN 9FT ADLT (ELECTROSURGICAL) ×1 IMPLANT
EVACUATOR 1/8 PVC DRAIN (DRAIN) ×2 IMPLANT
FACESHIELD LNG OPTICON STERILE (SAFETY) ×8 IMPLANT
GLOVE BIO SURGEON STRL SZ7.5 (GLOVE) ×2 IMPLANT
GLOVE BIO SURGEON STRL SZ8 (GLOVE) ×2 IMPLANT
GLOVE BIOGEL PI IND STRL 8 (GLOVE) ×2 IMPLANT
GLOVE BIOGEL PI INDICATOR 8 (GLOVE) ×2
GLOVE SURG SS PI 6.5 STRL IVOR (GLOVE) ×4 IMPLANT
GOWN STRL NON-REIN LRG LVL3 (GOWN DISPOSABLE) ×4 IMPLANT
GOWN STRL REIN XL XLG (GOWN DISPOSABLE) ×2 IMPLANT
IMMOBILIZER KNEE 20 (SOFTGOODS)
IMMOBILIZER KNEE 20 THIGH 36 (SOFTGOODS) IMPLANT
KIT BASIN OR (CUSTOM PROCEDURE TRAY) ×2 IMPLANT
MANIFOLD NEPTUNE II (INSTRUMENTS) ×2 IMPLANT
NDL SAFETY ECLIPSE 18X1.5 (NEEDLE) ×1 IMPLANT
NEEDLE HYPO 18GX1.5 SHARP (NEEDLE) ×2
NS IRRIG 1000ML POUR BTL (IV SOLUTION) ×2 IMPLANT
PACK TOTAL JOINT (CUSTOM PROCEDURE TRAY) ×2 IMPLANT
PASSER SUT SWANSON 36MM LOOP (INSTRUMENTS) ×2 IMPLANT
POSITIONER SURGICAL ARM (MISCELLANEOUS) ×2 IMPLANT
SPONGE GAUZE 4X4 12PLY (GAUZE/BANDAGES/DRESSINGS) ×2 IMPLANT
STRIP CLOSURE SKIN 1/2X4 (GAUZE/BANDAGES/DRESSINGS) ×4 IMPLANT
SUT ETHIBOND NAB CT1 #1 30IN (SUTURE) ×4 IMPLANT
SUT MNCRL AB 4-0 PS2 18 (SUTURE) ×2 IMPLANT
SUT VIC AB 2-0 CT1 27 (SUTURE) ×6
SUT VIC AB 2-0 CT1 TAPERPNT 27 (SUTURE) ×3 IMPLANT
SUT VLOC 180 0 24IN GS25 (SUTURE) ×4 IMPLANT
SYR 50ML LL SCALE MARK (SYRINGE) ×2 IMPLANT
TOWEL OR 17X26 10 PK STRL BLUE (TOWEL DISPOSABLE) ×4 IMPLANT
TOWEL OR NON WOVEN STRL DISP B (DISPOSABLE) ×2 IMPLANT
TRAY FOLEY CATH 14FRSI W/METER (CATHETERS) ×2 IMPLANT
WATER STERILE IRR 1500ML POUR (IV SOLUTION) ×2 IMPLANT

## 2012-05-21 NOTE — Interval H&P Note (Signed)
History and Physical Interval Note:  05/21/2012 2:28 PM  Victoria [name]  has presented today for surgery, with the diagnosis of osteoarthritis of the right hip  The various methods of treatment have been discussed with the patient and family. After consideration of risks, benefits and other options for treatment, the patient has consented to  Procedure(s): TOTAL HIP ARTHROPLASTY (Right) as a surgical intervention .  The patient's history has been reviewed, patient examined, no change in status, stable for surgery.  I have reviewed the patient's chart and labs.  Questions were answered to the patient's satisfaction.     Loanne Drilling

## 2012-05-21 NOTE — Anesthesia Preprocedure Evaluation (Addendum)
Anesthesia Evaluation  Patient identified by MRN, date of birth, ID band Patient awake    Reviewed: Allergy & Precautions, H&P , NPO status , Patient's Chart, lab work & pertinent test results, reviewed documented beta blocker date and time   History of Anesthesia Complications (+) PONV  Airway Mallampati: II TM Distance: >3 FB Neck ROM: full    Dental no notable dental hx.    Pulmonary neg pulmonary ROS,  breath sounds clear to auscultation  Pulmonary exam normal       Cardiovascular Exercise Tolerance: Good negative cardio ROS  Rhythm:regular Rate:Normal     Neuro/Psych Chronic back pain.  Neuromuscular disease negative psych ROS   GI/Hepatic Neg liver ROS, hiatal hernia, GERD-  Medicated,  Endo/Other  Hypothyroidism Morbid obesity  Renal/GU negative Renal ROS  negative genitourinary   Musculoskeletal   Abdominal (+) + obese,   Peds  Hematology negative hematology ROS (+) anemia ,   Anesthesia Other Findings   Reproductive/Obstetrics negative OB ROS                          Anesthesia Physical Anesthesia Plan  ASA: III  Anesthesia Plan: General   Post-op Pain Management:    Induction: Intravenous  Airway Management Planned: Oral ETT  Additional Equipment:   Intra-op Plan:   Post-operative Plan: Extubation in OR  Informed Consent: I have reviewed the patients History and Physical, chart, labs and discussed the procedure including the risks, benefits and alternatives for the proposed anesthesia with the patient or authorized representative who has indicated his/her understanding and acceptance.   Dental Advisory Given  Plan Discussed with: CRNA  Anesthesia Plan Comments: (Discussed general versus spinal. Patient prefers general. She had general for her other THA without problem except slight nausea.)        Anesthesia Quick Evaluation

## 2012-05-21 NOTE — Transfer of Care (Signed)
Immediate Anesthesia Transfer of Care Note  Patient: Victoria [name]  Procedure(s) Performed: Procedure(s): TOTAL HIP ARTHROPLASTY (Right)  Patient Location: PACU  Anesthesia Type:General  Level of Consciousness: sedated  Airway & Oxygen Therapy: Patient Spontanous Breathing and Patient connected to face mask oxygen  Post-op Assessment: Report given to PACU RN and Post -op Vital signs reviewed and stable  Post vital signs: Reviewed and stable  Complications: No apparent anesthesia complications

## 2012-05-21 NOTE — H&P (View-Only) (Signed)
Victoria [name]  DOB: 09/03/1960 Divorced / Language: English / Race: Black or African American Female  Date of Admission:  05/21/2012  Chief Complaint:  Right Hip Pain  History of Present Illness The patient is a 52 year old female who comes in for a preoperative History and Physical. The patient is scheduled for a right total hip arthroplasty to be performed by Dr. Gus Rankin. [name], MD at [facility] on 05/21/2012. The patient is a 52 year old female who presents for follow up of their hip. The patient is being followed for their right hip pain and osteoarthritis. Symptoms reported today include: pain and stiffness. The patient feels that they are doing poorly and report their pain level to be moderate to severe. The following medication has been used for pain control: antiinflammatory medication (ibuprofen 800mg ) and Hydrocodone (for severe pain). Her right hip is at a stage now where she can't tolerate it anymore. Pain is as bad as the left one was prior to her left hip surgery. She states that the pain is even going down to her knee. She's not having any lower extremity weakness or paresthesias with it. Occasionally she will get some pain in the left hip, mainly lateral and in the buttock. She is ready to proceed with surgery. They have been treated conservatively in the past for the above stated problem and despite conservative measures, they continue to have progressive pain and severe functional limitations and dysfunction. They have failed non-operative management including home exercise, medications. It is felt that they would benefit from undergoing total joint replacement. Risks and benefits of the procedure have been discussed with the patient and they elect to proceed with surgery. There are no active contraindications to surgery such as ongoing infection or rapidly progressive neurological disease.     Problem List Osteoarthritis, Hip (715.35) S/P total hip  arthroplasty (V43.64)   Allergies No Known Drug Allergies   Family History Heart Disease. father Diabetes Mellitus. father and sister father, sister and grandfather mothers side Hypertension. mother and father mother, father and sister   Social History Illicit drug use. no Exercise. Exercises rarely Marital status. divorced Living situation. live with partner Current work status. working full time Children. 2 Copy of Drug/Alcohol Rehab (Previously). no Drug/Alcohol Rehab (Currently). no Pain Contract. no Number of flights of stairs before winded. 1 Tobacco use. never smoker Alcohol use. former drinker current drinker; only occasionally per week Post-Surgical Plans. Plan is to go home.   Medication History Ibuprofen (800MG  Tablet, Oral) Active. Aspirin EC (81MG  Tablet DR, Oral) Active. Synthroid ( Oral) Specific dose unknown - Active. NexIUM (40MG  Capsule DR, Oral) Active. Vitamin D (50000UNIT Capsule, Oral) Active.   Past Surgical History Gallbladder Surgery. open Hysterectomy. partial (non-cancerous) partial (cancerous) Thyroidectomy; Total Colon Polyp Removal - Colonoscopy Cesarean Delivery. 1 time Total Hip Replacement. left Tubal Ligation Bilateral Lasix Surgery. Date: 2008. EGD   Medical History Migraine Headache Gastroesophageal Reflux Disease Hypothyroidism Osteoarthritis Anemia Varicose veins Clostridium difficile infection (041.84). 2013 following two courses of antibiotics  Review of Systems General:Not Present- Chills, Fever, Night Sweats, Fatigue, Weight Gain, Weight Loss and Memory Loss. Skin:Not Present- Hives, Itching, Rash, Eczema and Lesions. HEENT:Not Present- Tinnitus, Headache, Double Vision, Visual Loss, Hearing Loss and Dentures. Respiratory:Present- Cough (recent URI and taking Levaquin prior to surgery (will have completed before admission).). Not Present- Shortness of breath with exertion,  Shortness of breath at rest, Allergies, Coughing up blood and Chronic Cough. Cardiovascular:Not Present- Chest Pain, Racing/skipping heartbeats,  Difficulty Breathing Lying Down, Murmur, Swelling and Palpitations. Gastrointestinal:Not Present- Bloody Stool, Heartburn, Abdominal Pain, Vomiting, Nausea, Constipation, Diarrhea, Difficulty Swallowing, Jaundice and Loss of appetitie. Female Genitourinary:Not Present- Blood in Urine, Urinary frequency, Weak urinary stream, Discharge, Flank Pain, Incontinence, Painful Urination, Urgency, Urinary Retention and Urinating at Night. Musculoskeletal:Present- Joint Pain:  Not Present -Muscle Weakness, Muscle Pain, Joint Swelling, Back Pain, Morning Stiffness and Spasms. Neurological:Not Present- Tremor, Dizziness, Blackout spells, Paralysis, Difficulty with balance and Weakness. Psychiatric:Not Present- Insomnia.   Vitals Weight: 258 lb Height: 67 in Height was reported by patient. Body Surface Area: 2.35 m Body Mass Index: 40.41 kg/m Pulse: 60 (Regular) Resp.: 12 (Unlabored) BP: 118/78 (Sitting, Right Arm, Standard)    Physical Exam The physical exam findings are as follows:   General Mental Status - Alert, cooperative and good historian. General Appearance- pleasant. Not in acute distress. Orientation- Oriented X3. Build & Nutrition- Well nourished and Well developed.   Head and Neck Head- normocephalic, atraumatic . Neck Global Assessment- supple. no bruit auscultated on the right and no bruit auscultated on the left.   Eye Pupil- Bilateral- Regular and Round. Motion- Bilateral- EOMI.   Chest and Lung Exam Auscultation: Breath sounds:- clear at anterior chest wall and - clear at posterior chest wall. Adventitious sounds:- No Adventitious sounds.   Cardiovascular Auscultation:Rhythm- Regular rate and rhythm. Heart Sounds- S1 WNL and S2 WNL. Murmurs & Other Heart Sounds:Auscultation of the  heart reveals - No Murmurs.   Abdomen Palpation/Percussion:Tenderness- Abdomen is non-tender to palpation. Rigidity (guarding)- Abdomen is soft. Auscultation:Auscultation of the abdomen reveals - Bowel sounds normal.   Female Genitourinary  Not done, not pertinent to present illness  Musculoskeletal On exam, she's alert and oriented, in no apparent distress. Left hip can be flexed to 110, rotated in 30, out 40, abducted 40 without discomfort. There is no trochanteric tenderness. Right hip flexion 90. No internal rotation. About 20 external rotation, 20 abduction. Right knee has no effusion. Range is 0-125. There is minimal crepitus on ROM. No joint line tenderness or instability noted.  RADIOGRAPHS: AP pelvis and AP and lateral of both hips show the prosthesis on the left in excellent position with no periprosthetic abnormalities. On the right she has bone on bone arthritis of the hip with subchondral cyst formation and flattening of the femoral head. Her knees, AP of both and a lateral, show no arthritis, acute or chronic bony abnormalities.  Assessment & Plan Osteoarthritis, Hip (715.35) Impression: Right Hip  Note: Plan is for a Right Total Hip Replacement by Dr. Lequita Halt.  Plan is to go home following the hospital stay.  PCP - Dr. Eden Emms Baxley - Patient has been seen preoperatively and felt to be stable for surgery.  Signed electronically by Roberts Gaudy, PA-C

## 2012-05-21 NOTE — Op Note (Signed)
Pre-operative diagnosis- Osteoarthritis Right hip  Post-operative diagnosis- Osteoarthritis  Right hip  Procedure-  RightTotal Hip Arthroplasty  Surgeon- Gus Rankin. [name], MD  Assistant- Avel Peace, PA-C   Anesthesia  General  EBL- 350   Drain Hemovac   Complication- None  Condition-PACU - hemodynamically stable.   Brief Clinical Note-  Victoria [name] is a 52 y.o. female with end stage arthritis of her right hip with progressively worsening pain and dysfunction. Pain occurs with activity and rest including pain at night. She has tried analgesics, protected weight bearing and rest without benefit. Pain is too severe to attempt physical therapy. Radiographs demonstrate bone on bone arthritis with subchondral cyst formation. She presents now for right THA.   Procedure in detail-   The patient is brought into the operating room and placed on the operating table. After successful administration of General  anesthesia, the patient is placed in the  Left lateral decubitus position with the  Right side up and held in place with the hip positioner. The lower extremity is isolated from the perineum with plastic drapes and time-out is performed by the surgical team. The lower extremity is then prepped and draped in the usual sterile fashion. A short posterolateral incision is made with a ten blade through the subcutaneous tissue to the level of the fascia lata which is incised in line with the skin incision. The sciatic nerve is palpated and protected and the short external rotators and capsule are isolated from the femur. The hip is then dislocated and the center of the femoral head is marked. A trial prosthesis is placed such that the trial head corresponds to the center of the patients' native femoral head. The resection level is marked on the femoral neck and the resection is made with an oscillating saw. The femoral head is removed and femoral retractors placed to gain access to the femoral  canal.      The canal finder is passed into the femoral canal and the canal is thoroughly irrigated with sterile saline to remove the fatty contents. Axial reaming is performed to 13.5  mm, proximal reaming to 18D  and the sleeve machined to a small. A 18 D small trial sleeve is placed into the proximal femur.      The femur is then retracted anteriorly to gain acetabular exposure. Acetabular retractors are placed and the labrum and osteophytes are removed, Acetabular reaming is performed to 49  mm and a 50  mm Pinnacle acetabular shell is placed in anatomic position with excellent purchase. Additional dome screws were not needed. The permanent 32 mm neutral + 4 Marathon liner is placed into the acetabular shell.      The trial femur is then placed into the femoral canal. The size is 18 x 13  stem with a 36 + 8  neck and a 32 + 6 head with the neck version 10 degrees beyond  the patients' native anteversion. The hip is reduced with excellent stability with full extension and full external rotation, 70 degrees flexion with 40 degrees adduction and 90 degrees internal rotation and 90 degrees of flexion with 70 degrees of internal rotation. The operative leg is placed on top of the non-operative leg and the leg lengths are found to be equal. The trials are then removed and the permanent implant of the same size is impacted into the femoral canal. The ceramic femoral head of the same size as the trial is placed and the hip is reduced with  the same stability parameters. The operative leg is again placed on top of the non-operative leg and the leg lengths are found to be equal.      The wound is then copiously irrigated with saline solution and the capsule and short external rotators are re-attached to the femur through drill holes with Ethibond suture. The fascia lata is closed over a hemovac drain with #1 vicryl suture and the fascia lata, gluteal muscles and subcutaneous tissues are injected with Exparel 20ml  diluted with saline 50ml. The subcutaneous tissues are closed with #1 and2-0 vicryl and the subcuticular layer closed with running 4-0 Monocryl. The drain is hooked to suction, incision cleaned and dried, and steri-srips and a bulky sterile dressing applied. The limb is placed into a knee immobilizer and the patient is awakened and transported to recovery in stable condition.      Please note that a surgical assistant was a medical necessity for this procedure in order to perform it in a safe and expeditious manner. The assistant was necessary to provide retraction to the vital neurovascular structures and to retract and position the limb to allow for anatomic placement of the prosthetic components.  Gus Rankin [name], MD    05/21/2012, 4:17 PM

## 2012-05-21 NOTE — Anesthesia Postprocedure Evaluation (Signed)
  Anesthesia Post-op Note  Patient: Victoria [name]  Procedure(s) Performed: Procedure(s) (LRB): TOTAL HIP ARTHROPLASTY (Right)  Patient Location: PACU  Anesthesia Type: General  Level of Consciousness: awake and alert   Airway and Oxygen Therapy: Patient Spontanous Breathing  Post-op Pain: mild  Post-op Assessment: Post-op Vital signs reviewed, Patient's Cardiovascular Status Stable, Respiratory Function Stable, Patent Airway and No signs of Nausea or vomiting  Last Vitals:  Filed Vitals:   05/21/12 1745  BP: 141/94  Pulse: 77  Temp:   Resp: 12    Post-op Vital Signs: stable   Complications: No apparent anesthesia complications

## 2012-05-22 ENCOUNTER — Encounter (HOSPITAL_COMMUNITY): Payer: Self-pay | Admitting: Orthopedic Surgery

## 2012-05-22 LAB — BASIC METABOLIC PANEL
BUN: 8 mg/dL (ref 6–23)
CO2: 25 mEq/L (ref 19–32)
Calcium: 8.5 mg/dL (ref 8.4–10.5)
Chloride: 101 mEq/L (ref 96–112)
Creatinine, Ser: 0.71 mg/dL (ref 0.50–1.10)
GFR calc Af Amer: >90 mL/min (ref >90)
GFR calc non Af Amer: >90 mL/min (ref >90)
Glucose, Bld: 149 mg/dL — ABNORMAL HIGH (ref 70–99)
Potassium: 4 mEq/L (ref 3.5–5.1)
Sodium: 133 mEq/L — ABNORMAL LOW (ref 135–145)

## 2012-05-22 LAB — CBC
HCT: 29.4 % — ABNORMAL LOW (ref 36.0–46.0)
Hemoglobin: 10 g/dL — ABNORMAL LOW (ref 12.0–15.0)
MCH: 31.3 pg (ref 26.0–34.0)
MCHC: 34 g/dL (ref 30.0–36.0)
MCV: 92.2 fL (ref 78.0–100.0)
Platelets: 159 10*3/uL (ref 150–400)
RBC: 3.19 MIL/uL — ABNORMAL LOW (ref 3.87–5.11)
RDW: 13.7 % (ref 11.5–15.5)
WBC: 6.3 10*3/uL (ref 4.0–10.5)

## 2012-05-22 MED ORDER — ESOMEPRAZOLE MAGNESIUM 40 MG PO CPDR
40.0000 mg | DELAYED_RELEASE_CAPSULE | Freq: Every day | ORAL | Status: DC
  Administered 2012-05-22 – 2012-05-23 (×2): 40 mg via ORAL
  Filled 2012-05-22 (×2): qty 1

## 2012-05-22 MED ORDER — NON FORMULARY: 40.0000 mg | Freq: Every day | Status: DC

## 2012-05-22 NOTE — Evaluation (Signed)
Physical Therapy Evaluation Patient Details Name: Victoria [name] MRN: [id number] DOB: October 15, 1960 Today's Date: 05/22/2012 Time: 1191-4782 PT Time Calculation (min): 18 min  PT Assessment / Plan / Recommendation Clinical Impression  52 yo female s/p R THA-posterior approach. On eval pt required Min guard-Min assist for mobility. Able to ambulate ~75 feet with RW. Anticipate pt will progress well during stay. Recommend HHPT. No DME needs.     PT Assessment  Patient needs continued PT services    Follow Up Recommendations  Home health PT    Does the patient have the potential to tolerate intense rehabilitation      Barriers to Discharge        Equipment Recommendations  None recommended by PT    Recommendations for Other Services OT consult   Frequency 7X/week    Precautions / Restrictions Precautions Precautions: Posterior Hip Precaution Booklet Issued: Yes (comment) Precaution Comments: Verbally reviewed and demonstrated hip precautions and WBAT status. (Pt able to recall 1/3 prec) Restrictions Weight Bearing Restrictions: No RLE Weight Bearing: Weight bearing as tolerated   Pertinent Vitals/Pain 7/10 R hip      Mobility  Bed Mobility Bed Mobility: Supine to Sit Supine to Sit: 4: Min guard;HOB elevated Details for Bed Mobility Assistance: VCS safety. Pt able to move R LE off bed without assist. Increased time.  Transfers Transfers: Sit to Stand;Stand to Sit Sit to Stand: 4: Min assist;From bed;With upper extremity assist Stand to Sit: 4: Min assist;To chair/3-in-1;With armrests;With upper extremity assist Details for Transfer Assistance: VCs safety, technique, hand placement. Assist to rise, stabilize, control descent Ambulation/Gait Ambulation/Gait Assistance: 4: Min guard Ambulation Distance (Feet): 75 Feet Assistive device: Rolling walker Ambulation/Gait Assistance Details: VCs safety, technique, sequence, distance from RW, step length. Slow gait speed.   Gait Pattern: Step-to pattern;Antalgic;Decreased stride length    Exercises     PT Diagnosis: Difficulty walking;Abnormality of gait;Acute pain  PT Problem List: Decreased strength;Decreased range of motion;Decreased mobility;Pain;Decreased activity tolerance;Decreased knowledge of precautions;Decreased knowledge of use of DME PT Treatment Interventions: DME instruction;Gait training;Stair training;Functional mobility training;Therapeutic activities;Therapeutic exercise;Patient/family education   PT Goals Acute Rehab PT Goals PT Goal Formulation: With patient Time For Goal Achievement: 05/29/12 Potential to Achieve Goals: Good Pt will go Supine/Side to Sit: with supervision PT Goal: Supine/Side to Sit - Progress: Goal set today Pt will go Sit to Supine/Side: with supervision PT Goal: Sit to Supine/Side - Progress: Goal set today Pt will go Sit to Stand: with supervision PT Goal: Sit to Stand - Progress: Goal set today Pt will Ambulate: 51 - 150 feet;with supervision;with rolling walker PT Goal: Ambulate - Progress: Goal set today Pt will Go Up / Down Stairs: 1-2 stairs;with least restrictive assistive device;with min assist PT Goal: Up/Down Stairs - Progress: Goal set today Pt will Perform Home Exercise Program: with supervision, verbal cues required/provided PT Goal: Perform Home Exercise Program - Progress: Goal set today  Visit Information  Last PT Received On: 05/22/12 Assistance Needed: +1    Subjective Data  Subjective: it hurst ya'll Patient Stated Goal: home tomorrow   Prior Functioning  Home Living Lives With: Significant other Available Help at Discharge: Family Type of Home: House Home Access: Stairs to enter Secretary/administrator of Steps: 2 Entrance Stairs-Rails: None Home Layout: Two level;Bed/bath upstairs Bathroom Shower/Tub: Walk-in shower (step in) Home Adaptive Equipment: Bedside commode/3-in-1;Walker - rolling (chair lift) Additional Comments: Pt  has chair lift that she will use to get upstairs. Prior Function Level of Independence: Independent  Able to Take Stairs?: Yes Communication Communication: No difficulties    Cognition  Cognition Overall Cognitive Status: Appears within functional limits for tasks assessed/performed Arousal/Alertness: Awake/alert Orientation Level: Appears intact for tasks assessed Behavior During Session: [facility] for tasks performed    Extremity/Trunk Assessment Right Lower Extremity Assessment RLE ROM/Strength/Tone: Deficits RLE ROM/Strength/Tone Deficits: hip abd/add 2/5, moves ankle well Left Lower Extremity Assessment LLE ROM/Strength/Tone: [facility] for tasks assessed Trunk Assessment Trunk Assessment: Normal   Balance    End of Session PT - End of Session Activity Tolerance: Patient limited by pain Patient left: in chair;with call bell/phone within reach  GP     Rebeca Alert, MPT Pager: [phone]

## 2012-05-22 NOTE — Care Management Note (Signed)
    Page 1 of 2   05/23/2012     3:08:20 PM   CARE MANAGEMENT NOTE 05/23/2012  Patient:  Victoria [name], Victoria [name]   Account Number:  [id number]  Date Initiated:  05/22/2012  Documentation initiated by:  Colleen Can  Subjective/Objective Assessment:   DX oa RT HIP; TOTAL HIP REPLACEMNT     Action/Plan:   CM spoke with patient. Plans are for patient to return to her home in [place] where spouse will be caregiver. She has walker but not sure if has wheels(husband will check), has commode seat. Wants [facility]   Anticipated DC Date:  05/23/2012   Anticipated DC Plan:  HOME W HOME HEALTH SERVICES      DC Planning Services  CM consult      [facility] Choice  HOME HEALTH   Choice offered to / List presented to:  C-1 Patient        HH arranged  HH-2 PT      [facility] agency  [facility]   Status of service:  Completed, signed off Medicare Important Message given?   (If response is "NO", the following Medicare IM given date fields will be blank) Date Medicare IM given:   Date Additional Medicare IM given:    Discharge Disposition:  HOME W HOME HEALTH SERVICES  Per UR Regulation:  Reviewed for med. necessity/level of care/duration of stay  If discussed at Long Length of Stay Meetings, dates discussed:    Comments:  03/'28/2014 Colleen Can BSN RN CCM [phone] Pt discharged today. [facility] will start services tomorrow 05/24/2012.   05/22/2012 Colleen Can BSN RN CCM [phone] Spoke with [facility] rep who advised that they can provide [facility] services.

## 2012-05-22 NOTE — Progress Notes (Signed)
Physical Therapy Treatment Patient Details Name: Victoria [name] MRN: [id number] DOB: Jul 08, 1960 Today's Date: 05/22/2012 Time: 4696-2952 PT Time Calculation (min): 21 min  PT Assessment / Plan / Recommendation Comments on Treatment Session  Progressing well with mobility. recommend HHPT.     Follow Up Recommendations  Home health PT     Does the patient have the potential to tolerate intense rehabilitation     Barriers to Discharge        Equipment Recommendations  None recommended by PT    Recommendations for Other Services OT consult  Frequency 7X/week   Plan Discharge plan remains appropriate    Precautions / Restrictions Precautions Precautions: None Precaution Booklet Issued: Yes (comment) Precaution Comments: Verbally reviewed and demonstrated hip precautions and WBAT status. (Pt able to recall 1/3 prec) Restrictions Weight Bearing Restrictions: No RLE Weight Bearing: Weight bearing as tolerated   Pertinent Vitals/Pain 7/10 R hip    Mobility  Bed Mobility Bed Mobility: Supine to Sit Supine to Sit: 4: Min guard Transfers Transfers: Sit to Stand;Stand to Sit Sit to Stand: 4: Min assist Stand to Sit: 4: Min guard Details for Transfer Assistance: VCs safety, technique, hand placement. Assist to rise, stabilize.  Ambulation/Gait Ambulation/Gait Assistance: 4: Min guard Ambulation Distance (Feet): 90 Feet Assistive device: Rolling walker Ambulation/Gait Assistance Details: VCs safety, technique, sequence, distance from Rw, step length. Slow gait speed. Gait Pattern: Step-to pattern;Antalgic;Decreased stride length    Exercises     PT Diagnosis:    PT Problem List:   PT Treatment Interventions:     PT Goals Acute Rehab PT Goals Pt will go Supine/Side to Sit: with supervision PT Goal: Supine/Side to Sit - Progress: Progressing toward goal Pt will go Sit to Stand: with supervision PT Goal: Sit to Stand - Progress: Progressing toward goal Pt will Ambulate:  51 - 150 feet;with supervision;with rolling walker PT Goal: Ambulate - Progress: Progressing toward goal  Visit Information  Last PT Received On: 05/22/12 Assistance Needed: +1    Subjective Data  Subjective: it feel better now, but i know its gonna hurt when i get up Patient Stated Goal: home tomorrow   Cognition       Balance     End of Session PT - End of Session Activity Tolerance: Patient limited by pain Patient left: in bed;with call bell/phone within reach   GP     Rebeca Alert, MPT Pager: [phone]

## 2012-05-22 NOTE — Progress Notes (Signed)
   Subjective: 1 Day Post-Op Procedure(s) (LRB): TOTAL HIP ARTHROPLASTY (Right) Patient reports pain as mild and moderate.   Patient seen in rounds with Dr. Lequita Halt.  She did have a tough night but meds are helping. Patient is well, but has had some minor complaints of pain in the hip and thigh, requiring pain medications We will start therapy today.  Plan is to go Home after hospital stay.  Objective: Vital signs in last 24 hours: Temp:  [97 F (36.1 C)-98.7 F (37.1 C)] 98.6 F (37 C) (03/27 0603) Pulse Rate:  [61-77] 66 (03/27 0603) Resp:  [8-18] 14 (03/27 0603) BP: (105-163)/(68-98) 105/69 mmHg (03/27 0603) SpO2:  [100 %] 100 % (03/27 0603) FiO2 (%):  [100 %] 100 % (03/26 1830) Weight:  [118.842 kg (262 lb)] 118.842 kg (262 lb) (03/26 1830)  Intake/Output from previous day:  Intake/Output Summary (Last 24 hours) at 05/22/12 0752 Last data filed at 05/22/12 0604  Gross per 24 hour  Intake   3570 ml  Output   2841 ml  Net    729 ml    Intake/Output this shift:    Labs:  Recent Labs  05/22/12 0457  HGB 10.0*    Recent Labs  05/22/12 0457  WBC 6.3  RBC 3.19*  HCT 29.4*  PLT 159    Recent Labs  05/22/12 0457  NA 133*  K 4.0  CL 101  CO2 25  BUN 8  CREATININE 0.71  GLUCOSE 149*  CALCIUM 8.5   No results found for this basename: LABPT, INR,  in the last 72 hours  EXAM General - Patient is Alert, Appropriate and Oriented Extremity - Neurovascular intact Sensation intact distally Dorsiflexion/Plantar flexion intact No cellulitis present Dressing - dressing C/D/I Motor Function - intact, moving foot and toes well on exam.  Hemovac pulled without difficulty.  Past Medical History  Diagnosis Date  . Hx of adenomatous colonic polyps 10/17/07  . Osteoarthritis     left hip  . Hydradenitis   . GERD (gastroesophageal reflux disease)   . Hypothyroidism   . Pernicious anemia   . Vitamin D deficiency   . HSV-2 infection   . Obesity   . Hiatal  hernia   . C. difficile diarrhea 10/2010  . Hemorrhoids   . Varicose veins     Assessment/Plan: 1 Day Post-Op Procedure(s) (LRB): TOTAL HIP ARTHROPLASTY (Right) Principal Problem:   OA (osteoarthritis) of hip  Estimated body mass index is 41.03 kg/(m^2) as calculated from the following:   Height as of this encounter: 5\' 7"  (1.702 m).   Weight as of this encounter: 118.842 kg (262 lb). Advance diet Up with therapy Plan for discharge tomorrow Discharge home with home health  DVT Prophylaxis - Xarelto Weight Bearing As Tolerated right Leg D/C Knee Immobilizer Hemovac Pulled Begin Therapy Hip Preacutions No vaccines.  PERKINS, ALEXZANDREW 05/22/2012, 7:52 AM

## 2012-05-23 ENCOUNTER — Other Ambulatory Visit: Payer: Self-pay | Admitting: *Deleted

## 2012-05-23 LAB — BASIC METABOLIC PANEL
BUN: 9 mg/dL (ref 6–23)
CO2: 25 mEq/L (ref 19–32)
Calcium: 8.9 mg/dL (ref 8.4–10.5)
Chloride: 101 mEq/L (ref 96–112)
Creatinine, Ser: 0.8 mg/dL (ref 0.50–1.10)
GFR calc Af Amer: >90 mL/min (ref >90)
GFR calc non Af Amer: 84 mL/min — ABNORMAL LOW (ref >90)
Glucose, Bld: 117 mg/dL — ABNORMAL HIGH (ref 70–99)
Potassium: 3.6 mEq/L (ref 3.5–5.1)
Sodium: 136 mEq/L (ref 135–145)

## 2012-05-23 LAB — CBC
HCT: 29.2 % — ABNORMAL LOW (ref 36.0–46.0)
Hemoglobin: 10.1 g/dL — ABNORMAL LOW (ref 12.0–15.0)
MCH: 32 pg (ref 26.0–34.0)
MCHC: 34.6 g/dL (ref 30.0–36.0)
MCV: 92.4 fL (ref 78.0–100.0)
Platelets: 166 10*3/uL (ref 150–400)
RBC: 3.16 MIL/uL — ABNORMAL LOW (ref 3.87–5.11)
RDW: 14 % (ref 11.5–15.5)
WBC: 10.6 10*3/uL — ABNORMAL HIGH (ref 4.0–10.5)

## 2012-05-23 MED ORDER — RIVAROXABAN 10 MG PO TABS: 10.0000 mg | ORAL_TABLET | Freq: Every day | ORAL | Status: DC

## 2012-05-23 MED ORDER — PANTOPRAZOLE SODIUM 40 MG PO TBEC: 40.0000 mg | DELAYED_RELEASE_TABLET | Freq: Every day | ORAL | Status: DC

## 2012-05-23 MED ORDER — TRAMADOL HCL 50 MG PO TABS: 50.0000 mg | ORAL_TABLET | Freq: Four times a day (QID) | ORAL | Status: DC | PRN

## 2012-05-23 MED ORDER — OXYCODONE HCL 5 MG PO TABS: 5.0000 mg | ORAL_TABLET | ORAL | Status: DC | PRN

## 2012-05-23 MED ORDER — METHOCARBAMOL 500 MG PO TABS: 500.0000 mg | ORAL_TABLET | Freq: Four times a day (QID) | ORAL | Status: DC | PRN

## 2012-05-23 NOTE — Progress Notes (Signed)
Physical Therapy Treatment Patient Details Name: Victoria [name] MRN: [id number] DOB: 1961-01-07 Today's Date: 05/23/2012 Time: 4098-1191 PT Time Calculation (min): 26 min  PT Assessment / Plan / Recommendation Comments on Treatment Session  Progressing well with mobility. Recommend HHPT. Plan is for d/c later today after 2nd session.     Follow Up Recommendations  Home health PT     Does the patient have the potential to tolerate intense rehabilitation     Barriers to Discharge        Equipment Recommendations  None recommended by PT    Recommendations for Other Services OT consult  Frequency 7X/week   Plan Discharge plan remains appropriate    Precautions / Restrictions Precautions Precautions: Posterior Hip Precaution Comments: Pt able to recall 2/3 hip precations. Reviewed 3/3.  Restrictions Weight Bearing Restrictions: No RLE Weight Bearing: Weight bearing as tolerated   Pertinent Vitals/Pain 7/10 R hip with activity    Mobility  Transfers Transfers: Sit to Stand;Stand to Sit Sit to Stand: 5: Supervision;From chair/3-in-1 Stand to Sit: 5: Supervision;To chair/3-in-1 Details for Transfer Assistance: VCs safety, technique, hand placement. Ambulation/Gait Ambulation/Gait Assistance: 4: Min guard Ambulation Distance (Feet): 100 Feet Assistive device: Rolling walker Ambulation/Gait Assistance Details: Slow gait speed.  Gait Pattern: Step-to pattern;Decreased stride length;Antalgic;Trunk flexed    Exercises Total Joint Exercises Ankle Circles/Pumps: AROM;Both;10 reps;Supine Quad Sets: AROM;Both;10 reps;Supine Short Arc Quad: AROM;Right;10 reps;Supine Heel Slides: AAROM;Right;10 reps;Supine Hip ABduction/ADduction: AAROM;Right;10 reps;Supine   PT Diagnosis:    PT Problem List:   PT Treatment Interventions:     PT Goals Acute Rehab PT Goals Pt will go Sit to Stand: with supervision PT Goal: Sit to Stand - Progress: Progressing toward goal Pt will Ambulate:  51 - 150 feet;with supervision;with rolling walker PT Goal: Ambulate - Progress: Progressing toward goal Pt will Perform Home Exercise Program: with supervision, verbal cues required/provided PT Goal: Perform Home Exercise Program - Progress: Progressing toward goal  Visit Information  Last PT Received On: 05/23/12 Assistance Needed: +1    Subjective Data  Subjective: Im just tired....not sleeping and they gave me benadryl Patient Stated Goal: home later today   Cognition  Cognition Overall Cognitive Status: Appears within functional limits for tasks assessed/performed Arousal/Alertness: Lethargic Orientation Level: Appears intact for tasks assessed Behavior During Session: [facility] for tasks performed    Balance  Balance Balance Assessed: Yes Dynamic Standing Balance Dynamic Standing - Balance Support: Right upper extremity supported;Left upper extremity supported Dynamic Standing - Level of Assistance: 5: Stand by assistance  End of Session PT - End of Session Activity Tolerance: Patient limited by fatigue Patient left: in chair;with call bell/phone within reach   GP     Rebeca Alert, MPT Pager: [phone]

## 2012-05-23 NOTE — Discharge Summary (Signed)
Physician Discharge Summary   Patient ID: Victoria [name] MRN: [id number] DOB/AGE: [birth year] 52 y.o.  Admit date: 05/21/2012 Discharge date: 05/23/2012  Primary Diagnosis:  Osteoarthritis Right hip  Admission Diagnoses:  Past Medical History  Diagnosis Date  . Hx of adenomatous colonic polyps 10/17/07  . Osteoarthritis     left hip  . Hydradenitis   . GERD (gastroesophageal reflux disease)   . Hypothyroidism   . Pernicious anemia   . Vitamin D deficiency   . HSV-2 infection   . Obesity   . Hiatal hernia   . C. difficile diarrhea 10/2010  . Hemorrhoids   . Varicose veins    Discharge Diagnoses:   Principal Problem:   OA (osteoarthritis) of hip Active Problems:   Postop Hyponatremia  Estimated body mass index is 41.03 kg/(m^2) as calculated from the following:   Height as of this encounter: 5\' 7"  (1.702 m).   Weight as of this encounter: 118.842 kg (262 lb).  Procedure(s) (LRB): TOTAL HIP ARTHROPLASTY (Right)   Consults: None  HPI: Victoria [name] is a 52 y.o. female with end stage arthritis of her right hip with progressively worsening pain and dysfunction. Pain occurs with activity and rest including pain at night. She has tried analgesics, protected weight bearing and rest without benefit. Pain is too severe to attempt physical therapy. Radiographs demonstrate bone on bone arthritis with subchondral cyst formation. She presents now for right THA.  Laboratory Data: Admission on 05/21/2012  Component Date Value Range Status  . ABO/RH(D) 05/21/2012 A POS   Final  . Antibody Screen 05/21/2012 NEG   Final  . Sample Expiration 05/21/2012 05/24/2012   Final  . WBC 05/22/2012 6.3  4.0 - 10.5 K/uL Final  . RBC 05/22/2012 3.19* 3.87 - 5.11 MIL/uL Final  . Hemoglobin 05/22/2012 10.0* 12.0 - 15.0 g/dL Final  . HCT 30/86/5784 29.4* 36.0 - 46.0 % Final  . MCV 05/22/2012 92.2  78.0 - 100.0 fL Final  . MCH 05/22/2012 31.3  26.0 - 34.0 pg Final  . MCHC 05/22/2012 34.0  30.0 -  36.0 g/dL Final  . RDW 69/62/9528 13.7  11.5 - 15.5 % Final  . Platelets 05/22/2012 159  150 - 400 K/uL Final  . Sodium 05/22/2012 133* 135 - 145 mEq/L Final  . Potassium 05/22/2012 4.0  3.5 - 5.1 mEq/L Final  . Chloride 05/22/2012 101  96 - 112 mEq/L Final  . CO2 05/22/2012 25  19 - 32 mEq/L Final  . Glucose, Bld 05/22/2012 149* 70 - 99 mg/dL Final  . BUN 41/32/4401 8  6 - 23 mg/dL Final  . Creatinine, Ser 05/22/2012 0.71  0.50 - 1.10 mg/dL Final  . Calcium 02/72/5366 8.5  8.4 - 10.5 mg/dL Final  . GFR calc non Af Amer 05/22/2012 >90  >90 mL/min Final  . GFR calc Af Amer 05/22/2012 >90  >90 mL/min Final   Comment:                                 The eGFR has been calculated                          using the CKD EPI equation.                          This calculation has not been  validated in all clinical                          situations.                          eGFR's persistently                          <90 mL/min signify                          possible Chronic Kidney Disease.  . WBC 05/23/2012 10.6* 4.0 - 10.5 K/uL Final  . RBC 05/23/2012 3.16* 3.87 - 5.11 MIL/uL Final  . Hemoglobin 05/23/2012 10.1* 12.0 - 15.0 g/dL Final  . HCT 16/11/9602 29.2* 36.0 - 46.0 % Final  . MCV 05/23/2012 92.4  78.0 - 100.0 fL Final  . MCH 05/23/2012 32.0  26.0 - 34.0 pg Final  . MCHC 05/23/2012 34.6  30.0 - 36.0 g/dL Final  . RDW 54/10/8117 14.0  11.5 - 15.5 % Final  . Platelets 05/23/2012 166  150 - 400 K/uL Final  . Sodium 05/23/2012 136  135 - 145 mEq/L Final  . Potassium 05/23/2012 3.6  3.5 - 5.1 mEq/L Final  . Chloride 05/23/2012 101  96 - 112 mEq/L Final  . CO2 05/23/2012 25  19 - 32 mEq/L Final  . Glucose, Bld 05/23/2012 117* 70 - 99 mg/dL Final  . BUN 14/78/2956 9  6 - 23 mg/dL Final  . Creatinine, Ser 05/23/2012 0.80  0.50 - 1.10 mg/dL Final  . Calcium 21/30/8657 8.9  8.4 - 10.5 mg/dL Final  . GFR calc non Af Amer 05/23/2012 84* >90 mL/min Final  . GFR  calc Af Amer 05/23/2012 >90  >90 mL/min Final   Comment:                                 The eGFR has been calculated                          using the CKD EPI equation.                          This calculation has not been                          validated in all clinical                          situations.                          eGFR's persistently                          <90 mL/min signify                          possible Chronic Kidney Disease.  Hospital Outpatient Visit on 05/08/2012  Component Date Value Range Status  . MRSA, PCR 05/08/2012 NEGATIVE  NEGATIVE Final  . Staphylococcus aureus 05/08/2012 NEGATIVE  NEGATIVE Final   Comment:  The Xpert SA Assay (FDA                          approved for NASAL specimens                          in patients over 10 years of age),                          is one component of                          a comprehensive surveillance                          program.  Test performance has                          been validated by [employer] for patients greater                          than or equal to 62 year old.                          It is not intended                          to diagnose infection nor to                          guide or monitor treatment.  Marland Kitchen aPTT 05/08/2012 35  24 - 37 seconds Final  . WBC 05/08/2012 3.6* 4.0 - 10.5 K/uL Final  . RBC 05/08/2012 3.74* 3.87 - 5.11 MIL/uL Final  . Hemoglobin 05/08/2012 11.4* 12.0 - 15.0 g/dL Final  . HCT 40/11/2723 34.9* 36.0 - 46.0 % Final  . MCV 05/08/2012 93.3  78.0 - 100.0 fL Final  . MCH 05/08/2012 30.5  26.0 - 34.0 pg Final  . MCHC 05/08/2012 32.7  30.0 - 36.0 g/dL Final  . RDW 36/64/4034 14.2  11.5 - 15.5 % Final  . Platelets 05/08/2012 194  150 - 400 K/uL Final  . Sodium 05/08/2012 139  135 - 145 mEq/L Final  . Potassium 05/08/2012 4.4  3.5 - 5.1 mEq/L Final   SLIGHT HEMOLYSIS  . Chloride 05/08/2012 103  96  - 112 mEq/L Final  . CO2 05/08/2012 29  19 - 32 mEq/L Final  . Glucose, Bld 05/08/2012 98  70 - 99 mg/dL Final  . BUN 74/25/9563 11  6 - 23 mg/dL Final  . Creatinine, Ser 05/08/2012 0.80  0.50 - 1.10 mg/dL Final  . Calcium 87/56/4332 9.0  8.4 - 10.5 mg/dL Final  . Total Protein 05/08/2012 7.9  6.0 - 8.3 g/dL Final  . Albumin 95/18/8416 3.6  3.5 - 5.2 g/dL Final  . AST 60/63/0160 22  0 - 37 U/L Final  . ALT 05/08/2012 16  0 - 35 U/L Final  . Alkaline Phosphatase 05/08/2012 50  39 - 117 U/L Final  . Total Bilirubin 05/08/2012 0.3  0.3 - 1.2 mg/dL  Final  . GFR calc non Af Amer 05/08/2012 84* >90 mL/min Final  . GFR calc Af Amer 05/08/2012 >90  >90 mL/min Final   Comment:                                 The eGFR has been calculated                          using the CKD EPI equation.                          This calculation has not been                          validated in all clinical                          situations.                          eGFR's persistently                          <90 mL/min signify                          possible Chronic Kidney Disease.  Marland Kitchen Prothrombin Time 05/08/2012 13.5  11.6 - 15.2 seconds Final  . INR 05/08/2012 1.04  0.00 - 1.49 Final  . Color, Urine 05/08/2012 YELLOW  YELLOW Final  . APPearance 05/08/2012 CLEAR  CLEAR Final  . Specific Gravity, Urine 05/08/2012 1.024  1.005 - 1.030 Final  . pH 05/08/2012 6.0  5.0 - 8.0 Final  . Glucose, UA 05/08/2012 NEGATIVE  NEGATIVE mg/dL Final  . Hgb urine dipstick 05/08/2012 TRACE* NEGATIVE Final  . Bilirubin Urine 05/08/2012 NEGATIVE  NEGATIVE Final  . Ketones, ur 05/08/2012 NEGATIVE  NEGATIVE mg/dL Final  . Protein, ur 40/98/1191 NEGATIVE  NEGATIVE mg/dL Final  . Urobilinogen, UA 05/08/2012 0.2  0.0 - 1.0 mg/dL Final  . Nitrite 47/82/9562 NEGATIVE  NEGATIVE Final  . Leukocytes, UA 05/08/2012 NEGATIVE  NEGATIVE Final  . Squamous Epithelial / LPF 05/08/2012 FEW* RARE Final  . WBC, UA 05/08/2012 0-2  <3  WBC/hpf Final  . RBC / HPF 05/08/2012 3-6  <3 RBC/hpf Final  . Bacteria, UA 05/08/2012 FEW* RARE Final     X-Rays:Dg Hip Complete Right  05/08/2012  *RADIOLOGY REPORT*  Clinical Data: Preoperative evaluation prior right total hip replacement.  RIGHT HIP - COMPLETE 2+ VIEW  Comparison: No priors.  Findings: AP view of the pelvis and AP and lateral views of the right hip demonstrates degenerative changes of osteoarthritis with joint space narrowing, subchondral sclerosis, subchondral cyst formation and osteophyte formation.  The bony pelvic ring is intact.  Postoperative changes of left total hip arthroplasty are noted.  A single surgical clip is noted in the right hemi pelvis projecting over the right sacrum.  IMPRESSION: 1.  Advanced degenerative changes of osteoarthritis in the right hip joint. 2.  Status post left total hip arthroplasty.   Original Report Authenticated By: Trudie Reed, M.D.    Dg Pelvis Portable  05/21/2012  *RADIOLOGY REPORT*  Clinical Data: Post right hip replacement  PORTABLE PELVIS  Comparison: 05/08/2012  Findings:  Evidence of a right total hip arthroplasty noted without evidence for hardware complication.  Right-sided presumed tubal ligation clips are again noted.  Left total hip arthroplasty again noted.  No fracture line is identified.  IMPRESSION: Expected postoperative appearance after right total hip arthroplasty.   Original Report Authenticated By: Christiana Pellant, M.D.    Dg Hip Portable 1 View Right  05/21/2012  *RADIOLOGY REPORT*  Clinical Data: Postop right hip replacement  PORTABLE RIGHT HIP - 1 VIEW  Comparison: 05/08/2012  Findings: Evidence of right total hip arthroplasty noted.  No evidence for hardware complication or fracture line.  Soft tissue gas is noted.  IMPRESSION: Expected postoperative appearance after right total hip arthroplasty.   Original Report Authenticated By: Christiana Pellant, M.D.     EKG: Orders placed in visit on 02/11/12  . EKG 12-LEAD    . EKG 12-LEAD     Hospital Course: Patient was admitted to [facility] and taken to the OR and underwent the above state procedure without complications.  Patient tolerated the procedure well and was later transferred to the recovery room and then to the orthopaedic floor for postoperative care.  They were given PO and IV analgesics for pain control following their surgery.  They were given 24 hours of postoperative antibiotics of  Anti-infectives   Start     Dose/Rate Route Frequency Ordered Stop   05/22/12 0600  ceFAZolin (ANCEF) IVPB 2 g/50 mL premix     2 g 100 mL/hr over 30 Minutes Intravenous On call to O.R. 05/21/12 1225 05/21/12 1501   05/21/12 2100  ceFAZolin (ANCEF) IVPB 2 g/50 mL premix     2 g 100 mL/hr over 30 Minutes Intravenous Every 6 hours 05/21/12 1847 05/22/12 0231     and started on DVT prophylaxis in the form of Xarelto.   PT and OT were ordered for total hip protocol.  The patient was allowed to be WBAT with therapy. Discharge planning was consulted to help with postop disposition and equipment needs.  Patient had a tough night on the evening of surgery.  They started to get up OOB with therapy on day one walking over 75 feet.  Hemovac drain was pulled without difficulty.  The knee immobilizer was removed and discontinued.  Continued to work with therapy into day two.  Dressing was changed on day two and the incision was healing well. Patient was seen in rounds, feeling better from the day before and was ready to go home later that day.   Discharge Medications: Prior to Admission medications   Medication Sig Start Date End Date Taking? Authorizing Provider  esomeprazole (NEXIUM) 40 MG capsule Take 40 mg by mouth as needed (heartburn).   Yes Historical Provider, MD  levothyroxine (SYNTHROID, LEVOTHROID) 150 MCG tablet Take 150 mcg by mouth daily before breakfast.   Yes Historical Provider, MD  methocarbamol (ROBAXIN) 500 MG tablet Take 1 tablet (500 mg total) by  mouth every 6 (six) hours as needed. 05/23/12   Alexzandrew Perkins, PA-C  oxyCODONE (OXY IR/ROXICODONE) 5 MG immediate release tablet Take 1-2 tablets (5-10 mg total) by mouth every 3 (three) hours as needed. 05/23/12   Alexzandrew Julien Girt, PA-C  rivaroxaban (XARELTO) 10 MG TABS tablet Take 1 tablet (10 mg total) by mouth daily with breakfast. Take Xarelto for two and a half more weeks, then discontinue Xarelto. Once the patient has completed the Xarelto, they may resume the 81 mg Aspirin. 05/23/12   Alexzandrew Julien Girt, PA-C  traMADol Janean Sark) 50  MG tablet Take 1-2 tablets (50-100 mg total) by mouth every 6 (six) hours as needed. 05/23/12   Alexzandrew Julien Girt, PA-C    Diet: Regular diet Activity:WBAT No bending hip over 90 degrees- A "L" Angle Do not cross legs Do not let foot roll inward When turning these patients a pillow should be placed between the patient's legs to prevent crossing. Patients should have the affected knee fully extended when trying to sit or stand from all surfaces to prevent excessive hip flexion. When ambulating and turning toward the affected side the affected leg should have the toes turned out prior to moving the walker and the rest of patient's body as to prevent internal rotation/ turning in of the leg. Abduction pillows are the most effective way to prevent a patient from not crossing legs or turning toes in at rest. If an abduction pillow is not ordered placing a regular pillow length wise between the patient's legs is also an effective reminder. It is imperative that these precautions be maintained so that the surgical hip does not dislocate. Follow-up:in 2 weeks Disposition - Home Discharged Condition: good   Discharge Orders   Future Appointments Provider Department Dept Phone   08/15/2012 9:45 AM Margaree Mackintosh, MD Sharlet Salina, MD [phone]   Future Orders Complete By Expires     Call MD / Call 911  As directed     Comments:      If you experience  chest pain or shortness of breath, CALL 911 and be transported to the hospital emergency room.  If you develope a fever above 101 F, pus (white drainage) or increased drainage or redness at the wound, or calf pain, call your surgeon's office.    Change dressing  As directed     Comments:      You may change your dressing dressing daily with sterile 4 x 4 inch gauze dressing and paper tape.  Do not submerge the incision under water.    Constipation Prevention  As directed     Comments:      Drink plenty of fluids.  Prune juice may be helpful.  You may use a stool softener, such as Colace (over the counter) 100 mg twice a day.  Use MiraLax (over the counter) for constipation as needed.    Diet general  As directed     Discharge instructions  As directed     Comments:      Pick up stool softner and laxative for home. Do not submerge incision under water. May shower. Continue to use ice for pain and swelling from surgery. Hip precautions.  Total Hip Protocol.  Take Xarelto for two and a half more weeks, then discontinue Xarelto. Once the patient has completed the Xarelto, they may resume the 81 mg Aspirin.    Do not sit on low chairs, stoools or toilet seats, as it may be difficult to get up from low surfaces  As directed     Driving restrictions  As directed     Comments:      No driving until released by the physician.    Follow the hip precautions as taught in Physical Therapy  As directed     Increase activity slowly as tolerated  As directed     Lifting restrictions  As directed     Comments:      No lifting until released by the physician.    Patient may shower  As directed     Comments:  You may shower without a dressing once there is no drainage.  Do not wash over the wound.  If drainage remains, do not shower until drainage stops.    TED hose  As directed     Comments:      Use stockings (TED hose) for 3 weeks on both leg(s).  You may remove them at night for sleeping.     Weight bearing as tolerated  As directed         Medication List    STOP taking these medications       aspirin 81 MG tablet     cyanocobalamin 1000 MCG/ML injection  Commonly known as:  (VITAMIN B-12)     ibuprofen 800 MG tablet  Commonly known as:  ADVIL,MOTRIN     valACYclovir 500 MG tablet  Commonly known as:  VALTREX     Vitamin D3 50000 UNITS Caps      TAKE these medications       esomeprazole 40 MG capsule  Commonly known as:  NEXIUM  Take 40 mg by mouth as needed (heartburn).     levothyroxine 150 MCG tablet  Commonly known as:  SYNTHROID, LEVOTHROID  Take 150 mcg by mouth daily before breakfast.     methocarbamol 500 MG tablet  Commonly known as:  ROBAXIN  Take 1 tablet (500 mg total) by mouth every 6 (six) hours as needed.     oxyCODONE 5 MG immediate release tablet  Commonly known as:  Oxy IR/ROXICODONE  Take 1-2 tablets (5-10 mg total) by mouth every 3 (three) hours as needed.     rivaroxaban 10 MG Tabs tablet  Commonly known as:  XARELTO  Take 1 tablet (10 mg total) by mouth daily with breakfast. Take Xarelto for two and a half more weeks, then discontinue Xarelto.  Once the patient has completed the Xarelto, they may resume the 81 mg Aspirin.     traMADol 50 MG tablet  Commonly known as:  ULTRAM  Take 1-2 tablets (50-100 mg total) by mouth every 6 (six) hours as needed.           Follow-up Information   Follow up with Loanne Drilling, MD. Schedule an appointment as soon as possible for a visit on 06/05/2012.   Contact information:   [address] [place] [address] [phone]       Signed: Patrica Duel 05/23/2012, 9:42 AM

## 2012-05-23 NOTE — Evaluation (Signed)
Occupational Therapy Evaluation Patient Details Name: Victoria [name] MRN: [id number] DOB: 1961-01-01 Today's Date: 05/23/2012 Time: 6962-9528 OT Time Calculation (min): 27 min  OT Assessment / Plan / Recommendation Clinical Impression  Pleasant 52 yr old female admittted for right THR.  Still needs some cueing for recalling all of her hip precautions however she is able to follow them appropriately during the session.  Pt has AE and DME needed and has been educated on OT techniques.  No further OT needs.    OT Assessment  Patient does not need any further OT services    Follow Up Recommendations  No OT follow up       Equipment Recommendations  None recommended by OT          Precautions / Restrictions Precautions Precautions: Posterior Hip;Fall Precaution Comments: Verbally reviewed and demonstrated hip precautions and WBAT status. (Pt able to recall 1/3 prec) Restrictions Weight Bearing Restrictions: No RLE Weight Bearing: Weight bearing as tolerated   Pertinent Vitals/Pain Pain 6/10 in the right hip, pt repositioned and asked for pain medication at conclusion of session, pt  Did not want ice on her hip at this time.    ADL  Eating/Feeding: Simulated;Independent Where Assessed - Eating/Feeding: Chair Grooming: Performed;Supervision/safety Where Assessed - Grooming: Unsupported standing Upper Body Bathing: Simulated;Set up Where Assessed - Upper Body Bathing: Unsupported sitting Lower Body Bathing: Simulated;Supervision/safety Where Assessed - Lower Body Bathing: Supported sit to stand Upper Body Dressing: Simulated;Set up Where Assessed - Upper Body Dressing: Unsupported sitting Lower Body Dressing: Performed;Supervision/safety;Other (comment) (with use of reacher and sockaide) Where Assessed - Lower Body Dressing: Supported sit to Pharmacist, hospital: Research scientist (life sciences) Method: Other (comment) (ambulate with RW) Education administrator: Comfort height toilet;Grab bars Toileting - Clothing Manipulation and Hygiene: Performed;Supervision/safety Where Assessed - Engineer, mining and Hygiene: Sit to stand from 3-in-1 or toilet Tub/Shower Transfer: Simulated;Supervision/safety Tub/Shower Transfer Method: Science writer: Walk in Scientist, research (physical sciences) Used: Rolling walker;Sock aid;Reacher Transfers/Ambulation Related to ADLs: Pt overall supervisionfor mobility using the RW. ADL Comments: Pt able to state 1/3 hip precautions.  Exhibits proficiency with donning and doffing socks using AE.  Plans to use her toilet with vanity beside of it and not her 3:1.  She reports that her 3:1 is too narrow but is not interested in attempting to get a wider one.  Discussed using a shower seat as well as pt stood and held onto the bars after her last hip replacement.      Visit Information  Last OT Received On: 05/23/12 Assistance Needed: +1    Subjective Data  Subjective: I know I have the reacher and sockaide but I'll have to get my husband to find them. Patient Stated Goal: Pt did not state but looking forward to going home today.   Prior Functioning     Home Living Lives With: Significant other Available Help at Discharge: Family Type of Home: House Home Access: Stairs to enter Secretary/administrator of Steps: 2 Entrance Stairs-Rails: None Home Layout: Two level;Bed/bath upstairs Bathroom Shower/Tub: Walk-in shower (step in) Firefighter: Handicapped height Home Adaptive Equipment: Bedside commode/3-in-1;Walker - rolling (chair lift) Prior Function Level of Independence: Independent Able to Take Stairs?: Yes Communication Communication: No difficulties Dominant Hand: Right         Vision/Perception Vision - History Baseline Vision: No visual deficits Patient Visual Report: No change from baseline Vision - Assessment Vision Assessment: Vision not  tested Perception Perception: Within Functional Limits Praxis Praxis:  Intact   Cognition  Cognition Overall Cognitive Status: Appears within functional limits for tasks assessed/performed Arousal/Alertness: Awake/alert Orientation Level: Appears intact for tasks assessed Behavior During Session: [facility] for tasks performed    Extremity/Trunk Assessment Right Upper Extremity Assessment RUE ROM/Strength/Tone: Within functional levels RUE Sensation: WFL - Light Touch RUE Coordination: WFL - gross/fine motor Left Upper Extremity Assessment LUE ROM/Strength/Tone: Within functional levels LUE Sensation: WFL - Light Touch LUE Coordination: WFL - gross/fine motor Trunk Assessment Trunk Assessment: Normal     Mobility Bed Mobility Bed Mobility: Supine to Sit Supine to Sit: 4: Min assist Details for Bed Mobility Assistance: Needs assist with moving the RLE off of the bed. Transfers Transfers: Sit to Stand Sit to Stand: 5: Supervision;With upper extremity assist;From toilet Stand to Sit: 5: Supervision;With upper extremity assist;To toilet        Balance Balance Balance Assessed: Yes Dynamic Standing Balance Dynamic Standing - Balance Support: Right upper extremity supported;Left upper extremity supported Dynamic Standing - Level of Assistance: 5: Stand by assistance   End of Session OT - End of Session Activity Tolerance: Patient tolerated treatment well Patient left: in chair;with call bell/phone within reach Nurse Communication: Mobility status     MCGUIRE,JAMES OTR/L Pager number [phone] 05/23/2012, 8:54 AM

## 2012-05-23 NOTE — Progress Notes (Signed)
Came to visit patient on behalf of [employer] as a benefit of having [employer]. Unfortunately patient was discharged prior to visit.   Raiford Noble, MSN-Ed, RN,BSN- [facility] Liaison-[phone]

## 2012-05-23 NOTE — Telephone Encounter (Signed)
Rx sent for Protonix in place of Omeprazole due to patient's insurance coverage.

## 2012-05-23 NOTE — Progress Notes (Signed)
   Subjective: 2 Days Post-Op Procedure(s) (LRB): TOTAL HIP ARTHROPLASTY (Right) Patient reports pain as mild.   Patient seen in rounds with Dr. Lequita Halt. Patient is well, and has had no acute complaints or problems Patient is ready to go home today.  Objective: Vital signs in last 24 hours: Temp:  [97.9 F (36.6 C)-98.6 F (37 C)] 98.2 F (36.8 C) (03/28 0417) Pulse Rate:  [70-89] 89 (03/28 0417) Resp:  [16-20] 18 (03/28 0435) BP: (109-136)/(71-82) 120/77 mmHg (03/28 0417) SpO2:  [97 %-98 %] 98 % (03/28 0417)  Intake/Output from previous day:  Intake/Output Summary (Last 24 hours) at 05/23/12 0811 Last data filed at 05/23/12 0417  Gross per 24 hour  Intake    840 ml  Output    750 ml  Net     90 ml    Intake/Output this shift:    Labs:  Recent Labs  05/22/12 0457 05/23/12 0425  HGB 10.0* 10.1*    Recent Labs  05/22/12 0457 05/23/12 0425  WBC 6.3 10.6*  RBC 3.19* 3.16*  HCT 29.4* 29.2*  PLT 159 166    Recent Labs  05/22/12 0457 05/23/12 0425  NA 133* 136  K 4.0 3.6  CL 101 101  CO2 25 25  BUN 8 9  CREATININE 0.71 0.80  GLUCOSE 149* 117*  CALCIUM 8.5 8.9   No results found for this basename: LABPT, INR,  in the last 72 hours  EXAM: General - Patient is Alert, Appropriate and Oriented Extremity - Neurovascular intact Sensation intact distally Dorsiflexion/Plantar flexion intact No cellulitis present Incision - clean, dry, no drainage, healing Motor Function - intact, moving foot and toes well on exam.   Assessment/Plan: 2 Days Post-Op Procedure(s) (LRB): TOTAL HIP ARTHROPLASTY (Right) Procedure(s) (LRB): TOTAL HIP ARTHROPLASTY (Right) Past Medical History  Diagnosis Date  . Hx of adenomatous colonic polyps 10/17/07  . Osteoarthritis     left hip  . Hydradenitis   . GERD (gastroesophageal reflux disease)   . Hypothyroidism   . Pernicious anemia   . Vitamin D deficiency   . HSV-2 infection   . Obesity   . Hiatal hernia   . C.  difficile diarrhea 10/2010  . Hemorrhoids   . Varicose veins    Principal Problem:   OA (osteoarthritis) of hip Active Problems:   Postop Hyponatremia  Estimated body mass index is 41.03 kg/(m^2) as calculated from the following:   Height as of this encounter: 5\' 7"  (1.702 m).   Weight as of this encounter: 118.842 kg (262 lb). Up with therapy Discharge home with home health Diet - Regular diet Follow up - in 2 weeks Activity - WBAT Disposition - Home Condition Upon Discharge - Good D/C Meds - See DC Summary DVT Prophylaxis - Xarelto  PERKINS, ALEXZANDREW 05/23/2012, 8:11 AM

## 2012-05-27 ENCOUNTER — Telehealth: Payer: Self-pay

## 2012-05-27 NOTE — Telephone Encounter (Signed)
Had her knee surgery on 05/21/2012. When she came out of recovery, noticed her lip was swollen. Now has the sensation that something is lodged in her throat, worse upon swallowing. Wants to make sure she didn't suffer any trauma from intubation. No dyspnea or sob. Per Dr. Lenord Fellers, should give it some time, but if not any better in a week or so, will refer to ENT. Patient informed.

## 2012-07-28 ENCOUNTER — Other Ambulatory Visit: Payer: Self-pay | Admitting: Internal Medicine

## 2012-08-05 ENCOUNTER — Other Ambulatory Visit: Payer: Self-pay | Admitting: Internal Medicine

## 2012-08-05 ENCOUNTER — Other Ambulatory Visit: Payer: Self-pay

## 2012-08-05 ENCOUNTER — Other Ambulatory Visit: Payer: 59 | Admitting: Internal Medicine

## 2012-08-05 DIAGNOSIS — E038 Other specified hypothyroidism: Secondary | ICD-10-CM

## 2012-08-05 DIAGNOSIS — Z1329 Encounter for screening for other suspected endocrine disorder: Secondary | ICD-10-CM

## 2012-08-06 LAB — TSH: TSH: 0.222 u[IU]/mL — ABNORMAL LOW (ref 0.350–4.500)

## 2012-08-11 ENCOUNTER — Encounter: Payer: Self-pay | Admitting: Internal Medicine

## 2012-08-11 ENCOUNTER — Ambulatory Visit (INDEPENDENT_AMBULATORY_CARE_PROVIDER_SITE_OTHER): Payer: 59 | Admitting: Internal Medicine

## 2012-08-11 VITALS — BP 106/68 | HR 76 | Temp 97.7°F | Wt 267.0 lb

## 2012-08-11 DIAGNOSIS — Z96649 Presence of unspecified artificial hip joint: Secondary | ICD-10-CM

## 2012-08-11 DIAGNOSIS — K219 Gastro-esophageal reflux disease without esophagitis: Secondary | ICD-10-CM

## 2012-08-11 DIAGNOSIS — Z96642 Presence of left artificial hip joint: Secondary | ICD-10-CM

## 2012-08-11 DIAGNOSIS — E538 Deficiency of other specified B group vitamins: Secondary | ICD-10-CM

## 2012-08-11 DIAGNOSIS — E039 Hypothyroidism, unspecified: Secondary | ICD-10-CM

## 2012-08-11 MED ORDER — CYANOCOBALAMIN 1000 MCG/ML IJ SOLN
1000.0000 ug | Freq: Once | INTRAMUSCULAR | Status: AC
  Administered 2012-08-11: 1000 ug via INTRAMUSCULAR

## 2012-08-11 NOTE — Progress Notes (Signed)
  Subjective:    Patient ID: Victoria [name], female    DOB: 1960-09-02, 52 y.o.   MRN: [id number]  HPI  52 year old Black female status post left hip replacement in March. Hasn't been able to lose weight since surgery. Returns back to work tomorrow. Has considered gastric bypass surgery but is afraid to do that. Is also afraid of lap band surgery. Asking if the medications work. Told her I had no familiarity with new weight loss medications. TSH is low suggesting her thyroid may be slightly over replaced. However she's been recuperating from surgery. I do not want to change her dose of thyroid at this point in time. We will reassess in 6 months. She also is having trouble getting B12 injection for home use. We gave her B12 injection here in the office today.    Review of Systems     Objective:   Physical Exam Neck is supple without thyromegaly. Chest clear. Cardiac exam regular rate and rhythm.       Assessment & Plan:  Obesity-continue diet weight loss and exercise and reassess in 6 months at time of physical exam  Hypothyroidism-TSH is low but will not change dose at this point in time. Reevaluate in 6 months  B12 deficiency-1 cc cyanocobalamin given  GE reflux-stable probably would improve with weight loss  Status post left hip replacement March 24 2  Plan: Physical examination due in December 2014. New prescription for cyanocobalamin injection. She may try different drugstore since [facility] does not have it in stock. 1 cc IM B12 given today.

## 2012-08-11 NOTE — Patient Instructions (Addendum)
New prescription written for B12 injection. Try another pharmacy to see if bile is available elsewhere. Continue 1 cc IM monthly B12. Return in 6 months for physical exam. Did not change dose of Synthroid at this point in time. Try to diet exercise and lose weight.

## 2012-08-15 ENCOUNTER — Ambulatory Visit: Payer: 59 | Admitting: Internal Medicine

## 2012-09-16 ENCOUNTER — Other Ambulatory Visit: Payer: Self-pay | Admitting: Internal Medicine

## 2012-09-30 ENCOUNTER — Encounter: Payer: Self-pay | Admitting: Internal Medicine

## 2012-10-10 ENCOUNTER — Ambulatory Visit (INDEPENDENT_AMBULATORY_CARE_PROVIDER_SITE_OTHER): Payer: 59 | Admitting: Internal Medicine

## 2012-10-10 ENCOUNTER — Encounter: Payer: Self-pay | Admitting: Internal Medicine

## 2012-10-10 VITALS — BP 100/72 | HR 68 | Temp 98.2°F | Wt 268.0 lb

## 2012-10-10 DIAGNOSIS — A499 Bacterial infection, unspecified: Secondary | ICD-10-CM

## 2012-10-10 DIAGNOSIS — N898 Other specified noninflammatory disorders of vagina: Secondary | ICD-10-CM

## 2012-10-10 DIAGNOSIS — N760 Acute vaginitis: Secondary | ICD-10-CM

## 2012-10-10 DIAGNOSIS — B9689 Other specified bacterial agents as the cause of diseases classified elsewhere: Secondary | ICD-10-CM

## 2012-10-10 MED ORDER — CLINDAMYCIN PHOSPHATE 2 % VA CREA: 1.0000 | TOPICAL_CREAM | Freq: Every day | VAGINAL | Status: DC

## 2012-10-10 MED ORDER — ESTROGENS CONJUGATED 0.3 MG PO TABS: 0.3000 mg | ORAL_TABLET | Freq: Every day | ORAL | Status: DC

## 2012-10-10 NOTE — Progress Notes (Addendum)
  Subjective:    Patient ID: Victoria [name], female    DOB: [birth year], 52 y.o.   MRN: [id number]  HPI  51 year old Black female complaining of vaginal discharge. Also having issues with hot flashes. Has had folliculitis in the groin area that has resolved.    Review of Systems     Objective:   Physical Exam no groin adenopathy. Fishy smelling vaginal discharge. Wet prep consistent with clue cells. GC and Chlamydia probes taken.        Assessment & Plan:  Bacterial vaginosis  Hot flashes-postmenopausal status post hysterectomy  Plan: Cleocin vaginal cream 1 applicator full in vagina each bedtime x7 days. For hot flashes start Premarin 0.625 mg daily.

## 2012-10-11 LAB — GC/CHLAMYDIA PROBE AMP
CT Probe RNA: NEGATIVE
GC Probe RNA: NEGATIVE

## 2013-02-13 ENCOUNTER — Other Ambulatory Visit: Payer: 59 | Admitting: Internal Medicine

## 2013-02-13 ENCOUNTER — Ambulatory Visit (INDEPENDENT_AMBULATORY_CARE_PROVIDER_SITE_OTHER): Payer: 59 | Admitting: Internal Medicine

## 2013-02-13 DIAGNOSIS — IMO0002 Reserved for concepts with insufficient information to code with codable children: Secondary | ICD-10-CM

## 2013-02-13 DIAGNOSIS — E669 Obesity, unspecified: Secondary | ICD-10-CM

## 2013-02-13 DIAGNOSIS — M1710 Unilateral primary osteoarthritis, unspecified knee: Secondary | ICD-10-CM

## 2013-02-13 DIAGNOSIS — E890 Postprocedural hypothyroidism: Secondary | ICD-10-CM

## 2013-02-13 DIAGNOSIS — K219 Gastro-esophageal reflux disease without esophagitis: Secondary | ICD-10-CM

## 2013-02-13 DIAGNOSIS — Z8639 Personal history of other endocrine, nutritional and metabolic disease: Secondary | ICD-10-CM

## 2013-02-13 DIAGNOSIS — Z1329 Encounter for screening for other suspected endocrine disorder: Secondary | ICD-10-CM

## 2013-02-13 DIAGNOSIS — Z0000 Encounter for general adult medical examination without abnormal findings: Secondary | ICD-10-CM

## 2013-02-13 DIAGNOSIS — M170 Bilateral primary osteoarthritis of knee: Secondary | ICD-10-CM

## 2013-02-13 DIAGNOSIS — E559 Vitamin D deficiency, unspecified: Secondary | ICD-10-CM

## 2013-02-13 LAB — COMPREHENSIVE METABOLIC PANEL
ALT: 10 U/L (ref 0–35)
AST: 11 U/L (ref 0–37)
Albumin: 3.6 g/dL (ref 3.5–5.2)
Alkaline Phosphatase: 51 U/L (ref 39–117)
BUN: 9 mg/dL (ref 6–23)
CO2: 26 mEq/L (ref 19–32)
Calcium: 8.7 mg/dL (ref 8.4–10.5)
Chloride: 104 mEq/L (ref 96–112)
Creat: 0.79 mg/dL (ref 0.50–1.10)
Glucose, Bld: 82 mg/dL (ref 70–99)
Potassium: 3.8 mEq/L (ref 3.5–5.3)
Sodium: 139 mEq/L (ref 135–145)
Total Bilirubin: 0.5 mg/dL (ref 0.3–1.2)
Total Protein: 6.6 g/dL (ref 6.0–8.3)

## 2013-02-13 LAB — LIPID PANEL
Cholesterol: 172 mg/dL (ref 0–200)
HDL: 64 mg/dL (ref >39)
LDL Cholesterol: 96 mg/dL (ref 0–99)
Total CHOL/HDL Ratio: 2.7 Ratio
Triglycerides: 62 mg/dL (ref <150)
VLDL: 12 mg/dL (ref 0–40)

## 2013-02-13 LAB — CBC WITH DIFFERENTIAL/PLATELET
Basophils Absolute: 0 10*3/uL (ref 0.0–0.1)
Basophils Relative: 0 % (ref 0–1)
Eosinophils Absolute: 0.1 10*3/uL (ref 0.0–0.7)
Eosinophils Relative: 2 % (ref 0–5)
HCT: 33.5 % — ABNORMAL LOW (ref 36.0–46.0)
Hemoglobin: 11.1 g/dL — ABNORMAL LOW (ref 12.0–15.0)
Lymphocytes Relative: 33 % (ref 12–46)
Lymphs Abs: 1.2 10*3/uL (ref 0.7–4.0)
MCH: 31 pg (ref 26.0–34.0)
MCHC: 33.1 g/dL (ref 30.0–36.0)
MCV: 93.6 fL (ref 78.0–100.0)
Monocytes Absolute: 0.3 10*3/uL (ref 0.1–1.0)
Monocytes Relative: 9 % (ref 3–12)
Neutro Abs: 2 10*3/uL (ref 1.7–7.7)
Neutrophils Relative %: 56 % (ref 43–77)
Platelets: 211 10*3/uL (ref 150–400)
RBC: 3.58 MIL/uL — ABNORMAL LOW (ref 3.87–5.11)
RDW: 14.3 % (ref 11.5–15.5)
WBC: 3.5 10*3/uL — ABNORMAL LOW (ref 4.0–10.5)

## 2013-02-13 LAB — POCT URINALYSIS DIPSTICK
Bilirubin, UA: NEGATIVE
Glucose, UA: NEGATIVE
Ketones, UA: NEGATIVE
Leukocytes, UA: NEGATIVE
Nitrite, UA: NEGATIVE
Protein, UA: NEGATIVE
Spec Grav, UA: 1.01
Urobilinogen, UA: NEGATIVE
pH, UA: 5

## 2013-02-13 LAB — TSH: TSH: 1.044 u[IU]/mL (ref 0.350–4.500)

## 2013-02-14 LAB — VITAMIN D 25 HYDROXY (VIT D DEFICIENCY, FRACTURES): Vit D, 25-Hydroxy: 36 ng/mL (ref 30–89)

## 2013-03-10 ENCOUNTER — Encounter: Payer: Self-pay | Admitting: Internal Medicine

## 2013-03-10 ENCOUNTER — Other Ambulatory Visit: Payer: Self-pay | Admitting: Internal Medicine

## 2013-03-10 ENCOUNTER — Ambulatory Visit (INDEPENDENT_AMBULATORY_CARE_PROVIDER_SITE_OTHER): Payer: 59 | Admitting: Internal Medicine

## 2013-03-10 ENCOUNTER — Ambulatory Visit
Admission: RE | Admit: 2013-03-10 | Discharge: 2013-03-10 | Disposition: A | Discharge status: Home / Self Care | Payer: 59 | Source: Ambulatory Visit | Attending: Internal Medicine | Admitting: Internal Medicine

## 2013-03-10 VITALS — BP 116/88 | HR 72 | Temp 98.3°F | Wt 268.0 lb

## 2013-03-10 DIAGNOSIS — R079 Chest pain, unspecified: Secondary | ICD-10-CM

## 2013-03-10 LAB — COMPREHENSIVE METABOLIC PANEL
ALT: 118 U/L — ABNORMAL HIGH (ref 0–35)
AST: 125 U/L — ABNORMAL HIGH (ref 0–37)
Albumin: 4.2 g/dL (ref 3.5–5.2)
Alkaline Phosphatase: 100 U/L (ref 39–117)
BUN: 14 mg/dL (ref 6–23)
CO2: 26 mEq/L (ref 19–32)
Calcium: 9.2 mg/dL (ref 8.4–10.5)
Chloride: 104 mEq/L (ref 96–112)
Creat: 0.88 mg/dL (ref 0.50–1.10)
Glucose, Bld: 74 mg/dL (ref 70–99)
Potassium: 4.1 mEq/L (ref 3.5–5.3)
Sodium: 138 mEq/L (ref 135–145)
Total Bilirubin: 0.6 mg/dL (ref 0.3–1.2)
Total Protein: 7.8 g/dL (ref 6.0–8.3)

## 2013-03-10 LAB — CBC WITH DIFFERENTIAL/PLATELET
Basophils Absolute: 0 10*3/uL (ref 0.0–0.1)
Basophils Relative: 0 % (ref 0–1)
Eosinophils Absolute: 0.1 10*3/uL (ref 0.0–0.7)
Eosinophils Relative: 2 % (ref 0–5)
HCT: 38 % (ref 36.0–46.0)
Hemoglobin: 12.6 g/dL (ref 12.0–15.0)
Lymphocytes Relative: 30 % (ref 12–46)
Lymphs Abs: 1.2 10*3/uL (ref 0.7–4.0)
MCH: 30.3 pg (ref 26.0–34.0)
MCHC: 33.2 g/dL (ref 30.0–36.0)
MCV: 91.3 fL (ref 78.0–100.0)
Monocytes Absolute: 0.2 10*3/uL (ref 0.1–1.0)
Monocytes Relative: 6 % (ref 3–12)
Neutro Abs: 2.5 10*3/uL (ref 1.7–7.7)
Neutrophils Relative %: 62 % (ref 43–77)
Platelets: 254 10*3/uL (ref 150–400)
RBC: 4.16 MIL/uL (ref 3.87–5.11)
RDW: 14.5 % (ref 11.5–15.5)
WBC: 4 10*3/uL (ref 4.0–10.5)

## 2013-03-10 LAB — SEDIMENTATION RATE: Sed Rate: 40 mm/hr — ABNORMAL HIGH (ref 0–22)

## 2013-03-10 LAB — TROPONIN I

## 2013-03-10 NOTE — Progress Notes (Signed)
Subjective:    Patient ID: Victoria [name], female    DOB: 16-Dec-1960, 53 y.o.   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  Patient had thyroidectomy in 1980 and is on thyroid replacement therapy. Cholecystectomy 1987. History of adenomatous colon polyp. Last colonoscopy 2009 C-section for twins 34. Bilateral tubal ligation in the 1980s. Total abdominal hysterectomy 2004. Left hip replacement December 2010. Right hip replacement March 2014.  History of H. pylori infection 2001. History of C. difficile infection treated by Dr. Olevia Perches in 2012. Had upper endoscopy in 2012 showing chronic duodenitis. Is on PPI but has not been taking that regularly. History of GE reflux.  History of B12 deficiency. She gives herself monthly B12 injections.  History of hidradenitis suppurativa.  Doxycycline causes nausea.  Social history: She is divorced. Does not smoke or consume alcohol. 2 adult twins daughters. 3 grandchildren. Does not have much time for exercise. She works 3 jobs including [employer]. 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  Family history: Mother still living with history of diabetes and hypertension. Father living with history of diabetes. 2 brothers and one sister. Sister has hypothyroidism.    Review of Systems  Constitutional: Positive for fatigue.  HENT: Negative.   Eyes: Negative.   Respiratory: Negative.   Cardiovascular: Negative.   Endocrine:       Surgical hypothyroidism  Genitourinary:       Status post TAH  Musculoskeletal: Positive for arthralgias and back pain.  Skin: Negative.        History of hidradenits but no recent episodes  Allergic/Immunologic: Negative.   Neurological: Negative.   Hematological: Negative.   Psychiatric/Behavioral: Negative.        Objective:   Physical Exam  Vitals reviewed. Constitutional: She is oriented to  person, place, and time. She appears well-developed and well-nourished. No distress.  HENT:  Head: Normocephalic and atraumatic.  Right Ear: External ear normal.  Left Ear: External ear normal.  Mouth/Throat: Oropharynx is clear and moist. No oropharyngeal exudate.  Eyes: Conjunctivae and EOM are normal. Pupils are equal, round, and reactive to light. Right eye exhibits no discharge. Left eye exhibits no discharge. No scleral icterus.  Neck: Neck supple. No JVD present. No thyromegaly present.  Cardiovascular: Normal rate, regular rhythm, normal heart sounds and intact distal pulses.   No murmur heard. Pulmonary/Chest: Effort normal and breath sounds normal. No respiratory distress. She has no wheezes. She has no rales. She exhibits no tenderness.  Breasts normal female  Abdominal: Soft. Bowel sounds are normal. She exhibits no distension and no mass. There is no tenderness. There is no rebound and no guarding.  Genitourinary:  Pap done of vaginal cuff 2013  Musculoskeletal: Normal range of motion. She exhibits no edema.  Osteoarthritis of knees  Lymphadenopathy:    She has no cervical adenopathy.  Neurological: She is alert and oriented to person, place, and time. She has normal reflexes. No cranial nerve deficit. Coordination normal.  Skin: Skin is warm and dry. No rash noted. She is not diaphoretic.  Psychiatric: She has a normal mood and affect. Her behavior is normal. Judgment and thought content normal.          Assessment & Plan:  History of B12 deficiency  History of C. difficile diarrhea in 2012  History of chronic duodenitis 2012  Status post bilateral hip replacements  Osteoarthritis of knees  Obesity  Hypothyroidism  History of GE reflux  History of vitamin D deficiency  Plan: Continue same medications and return in 6 months. Encouraged diet exercise and weight loss. This is difficult because she works 3 jobs.

## 2013-03-10 NOTE — Patient Instructions (Signed)
Continue same medications and return in 6 months 

## 2013-03-10 NOTE — Addendum Note (Signed)
Addended by: Brett Canales on: 03/10/2013 03:15 PM   Modules accepted: Orders

## 2013-03-10 NOTE — Progress Notes (Signed)
   Subjective:    Patient ID: Victoria [name], female    DOB: 1960-02-28, 53 y.o.   MRN: [id number]  HPI  Patient says that  late yesterday afternoon her left anterior lower leg began to hurt and she took some leftover Dilaudid for pain. 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 Her main job is with [employer]. Despite taking Dilaudid, she began to experience chest pain around 6 PM yesterday. Had chest pain which was described as substernal chest pain radiating to her back for some 45 minutes. She vomited once. Did not have diaphoresis. Subsequently pain subsided. She did not seek emergency attention. She called today wanting to be seen in the office and refused to go to the emergency department. She has a history of obesity and hypothyroidism. She sees Dr. Olevia Perches for GI issues. Doesn't take PPI on a daily basis. She takes aspirin daily.    Review of Systems     Objective:   Physical Exam skin is warm and dry. No acute distress. She has right parasternal chest wall tenderness to palpation. Chest is clear. Cardiac exam regular rate and rhythm normal S1 and S2. Extremities without edema. Neck is without JVD or thyromegaly. She is status post thyroidectomy. No carotid bruits. Extremities without edema.        Assessment & Plan:  Chest pain-could possibly have chest wall pain but symptoms arrange some and that she took Dilaudid and still had onset of chest.  Plan: Patient is to see cardiologist tomorrow for further evaluation. I did draw lab work today including a troponin 1. EKG shows no acute changes. Chest x-ray is negative.

## 2013-03-10 NOTE — Patient Instructions (Signed)
To see cardiologist tomorrow. Lab work is pending. Stay out of work today and tomorrow

## 2013-03-10 NOTE — Progress Notes (Signed)
Patient informed, troponin 1 not done . She is feeling all right, slept this pm.

## 2013-03-11 ENCOUNTER — Encounter: Payer: Self-pay | Admitting: Cardiology

## 2013-03-11 ENCOUNTER — Ambulatory Visit (HOSPITAL_COMMUNITY): Payer: 59 | Attending: Cardiology | Admitting: Radiology

## 2013-03-11 ENCOUNTER — Encounter: Payer: Self-pay | Admitting: General Surgery

## 2013-03-11 ENCOUNTER — Ambulatory Visit (INDEPENDENT_AMBULATORY_CARE_PROVIDER_SITE_OTHER): Payer: 59 | Admitting: Cardiology

## 2013-03-11 VITALS — BP 109/89 | Ht 67.0 in | Wt 269.0 lb

## 2013-03-11 VITALS — BP 137/86 | HR 79 | Ht 67.0 in | Wt 269.8 lb

## 2013-03-11 DIAGNOSIS — R0602 Shortness of breath: Secondary | ICD-10-CM

## 2013-03-11 DIAGNOSIS — R0609 Other forms of dyspnea: Secondary | ICD-10-CM | POA: Insufficient documentation

## 2013-03-11 DIAGNOSIS — R002 Palpitations: Secondary | ICD-10-CM | POA: Insufficient documentation

## 2013-03-11 DIAGNOSIS — R0989 Other specified symptoms and signs involving the circulatory and respiratory systems: Secondary | ICD-10-CM | POA: Insufficient documentation

## 2013-03-11 DIAGNOSIS — R110 Nausea: Secondary | ICD-10-CM | POA: Insufficient documentation

## 2013-03-11 DIAGNOSIS — R079 Chest pain, unspecified: Secondary | ICD-10-CM

## 2013-03-11 DIAGNOSIS — R1110 Vomiting, unspecified: Secondary | ICD-10-CM | POA: Insufficient documentation

## 2013-03-11 LAB — TROPONIN I: Troponin I: <0.3 ng/mL (ref <0.30)

## 2013-03-11 LAB — CK TOTAL AND CKMB (NOT AT ARMC)
CK, MB: 2.1 ng/mL (ref 0.3–4.0)
Total CK: 81 U/L (ref 7–177)

## 2013-03-11 LAB — H. PYLORI ANTIBODY, IGG: H Pylori IgG: 1.7 {ISR} — ABNORMAL HIGH

## 2013-03-11 LAB — AMYLASE: Amylase: 36 U/L (ref 0–105)

## 2013-03-11 LAB — LIPASE: Lipase: <10 U/L (ref 0–75)

## 2013-03-11 MED ORDER — TECHNETIUM TC 99M SESTAMIBI GENERIC - CARDIOLITE
30.0000 | Freq: Once | INTRAVENOUS | Status: AC | PRN
  Administered 2013-03-11: 30 via INTRAVENOUS

## 2013-03-11 MED ORDER — TECHNETIUM TC 99M SESTAMIBI GENERIC - CARDIOLITE
10.0000 | Freq: Once | INTRAVENOUS | Status: AC | PRN
  Administered 2013-03-11: 10 via INTRAVENOUS

## 2013-03-11 NOTE — Patient Instructions (Addendum)
Your physician recommends that you continue on your current medications as directed. Please refer to the Current Medication list given to you today.  Your physician recommends that you go to the lab today for a STAT Troponin and CPK/MB  Your physician has requested that you have an exercise stress myoview. For further information please visit [email or web address]. Please follow instruction sheet, as given.  Your physician has requested that you have an echocardiogram. Echocardiography is a painless test that uses sound waves to create images of your heart. It provides your doctor with information about the size and shape of your heart and how well your heart's chambers and valves are working. This procedure takes approximately one hour. There are no restrictions for this procedure.  Your physician recommends that you schedule a follow-up appointment one week after Stress Test.

## 2013-03-11 NOTE — Progress Notes (Signed)
[facility], [address] [place], [place]  [address] Phone: [phone] Fax:  [phone]  Date:  03/11/2013   ID:  Victoria [name], DOB 08-01-1960, MRN [id number]  PCP:  Elby Showers, MD  Cardiologist:  Fransico Him, MD     History of Present Illness: Victoria [name] is a 53 y.o. female with a history of GERD and hiatal hernia presents today for evaluation of chest pain.  She says that she has had chest pain in the past and it was thought to be GERD and has been placed on Protonix but would still get the CP every few months.  This past Monday she felt bad. Around 5pm she took some Dilaudid due to chronic leg pain.  She then got CP around 6:20pm as a dull presure and over time it intensified and became harder and stronger and she laid down.  SHe was SOB at the time and could not talk to her daughter on the phone.  At 7:09pm the pain eased off.  She also had some pain in her posterior neck and upper back.  Around 11pm she got up to go to the bathroom because she felt nauseated and went to the bathroom and vomited.  After that she felt ok and went back to bed and then next am she felt ok.  Since then she has not had any chest pain but still has some discomfort in her neck as well as a headache.  She still notices some DOE.     Wt Readings from Last 3 Encounters:  03/11/13 269 lb 12.8 oz (122.38 kg)  03/10/13 268 lb (121.564 kg)  10/10/12 268 lb (121.564 kg)     Past Medical History  Diagnosis Date  . Hx of adenomatous colonic polyps 10/17/07  . Osteoarthritis     left hip  . Hydradenitis   . GERD (gastroesophageal reflux disease)   . Hypothyroidism   . Pernicious anemia   . Vitamin D deficiency   . HSV-2 infection   . Obesity   . Hiatal hernia   . C. difficile diarrhea 10/2010  . Hemorrhoids   . Varicose veins     Current Outpatient Prescriptions  Medication Sig Dispense Refill  . aspirin 81 MG tablet Take 81 mg by mouth daily.      . Cholecalciferol (VITAMIN D) 2000 UNITS  CAPS Take by mouth.      Marland Kitchen HYDROmorphone (DILAUDID) 4 MG tablet Take 4 mg by mouth every 4 (four) hours as needed for pain.      Marland Kitchen levothyroxine (SYNTHROID, LEVOTHROID) 150 MCG tablet TAKE 1 TABLET BY MOUTH ONCE DAILY  90 tablet  1  . pantoprazole (PROTONIX) 40 MG tablet Take 40 mg by mouth as needed.      . valACYclovir (VALTREX) 500 MG tablet TAKE 1 TABLET BY MOUTH DAILY  90 tablet  PRN   No current facility-administered medications for this visit.    Allergies:   No Known Allergies  Social History:  The patient  reports that she has never smoked. She has never used smokeless tobacco. She reports that she does not drink alcohol or use illicit drugs.   Family History:  The patient's family history includes Diabetes in her father and sister.   ROS:  Please see the history of present illness.      All other systems reviewed and negative.   PHYSICAL EXAM: VS:  BP 137/86  Pulse 79  Ht 5\' 7"  (1.702 m)  Wt 269  lb 12.8 oz (122.38 kg)  BMI 42.25 kg/m2 Well nourished, well developed, in no acute distress HEENT: normal Neck: no JVD Cardiac:  normal S1, S2; RRR; no murmur Lungs:  clear to auscultation bilaterally, no wheezing, rhonchi or rales Abd: soft, nontender, no hepatomegaly Ext: no edema Skin: warm and dry Neuro:  CNs 2-12 intact, no focal abnormalities noted       ASSESSMENT AND PLAN:  1. Chest pain somewhat atypical but concerning since it occurred in the setting of SOB and nausea and vomiting.  She still has some mild upper back and lower neck pain that may or may not be related.  EKG today is normal.  - check stat CPK/MB and troponin  - Stress Myoview to rule out ischemia 2. SOB  - 2D echo evaluate LVF   Signed, Fransico Him, MD 03/11/2013 11:10 AM

## 2013-03-11 NOTE — Progress Notes (Signed)
done

## 2013-03-11 NOTE — Progress Notes (Signed)
[facility] [address] [place], [place] [address] [phone]    Cardiology Nuclear Med Study  Victoria [name] is a 53 y.o. female     MRN : [id number]     DOB: 18-Apr-1960  Procedure Date: 03/11/2013  Nuclear Med Background Indication for Stress Test:  Evaluation for Ischemia History:  n/a Cardiac Risk Factors: No Cardiac Risk Factors  Symptoms:  Chest Pain, DOE, Nausea, Palpitations and Vomiting   Nuclear Pre-Procedure Caffeine/Decaff Intake:  None NPO After: 8:30am   Lungs:   clear O2 Sat: 98% on room air. IV 0.9% NS with Angio Cath:  22g  IV Site: R Hand  IV Started by:  Crissie Figures, RN  Chest Size (in):  42 Cup Size: D  Height: 5\' 7"  (1.702 m)  Weight:  269 lb (122.018 kg)  BMI:  Body mass index is 42.12 kg/(m^2). Tech Comments:  N/A    Nuclear Med Study 1 or 2 day study: 1 day  Stress Test Type:  Stress  Reading MD: N/A  Order Authorizing Provider:  Fransico Him, MD  Resting Radionuclide: Technetium 25m Sestamibi  Resting Radionuclide Dose: 11.0 mCi   Stress Radionuclide:  Technetium 40m Sestamibi  Stress Radionuclide Dose: 33.0 mCi           Stress Protocol Rest HR: 57 Stress HR: 160  Rest BP: 109/89 Stress BP: 159/93  Exercise Time (min): 4:15 METS: 6.10   Predicted Max HR: 168 bpm % Max HR: 95.24 bpm Rate Pressure Product: 60737   Dose of Adenosine (mg):  n/a Dose of Lexiscan: n/a mg  Dose of Atropine (mg): n/a Dose of Dobutamine: n/a mcg/kg/min (at max HR)  Stress Test Technologist: Perrin Maltese, EMT-P  Nuclear Technologist:  Charlton Amor, CNMT     Rest Procedure:  Myocardial perfusion imaging was performed at rest 45 minutes following the intravenous administration of Technetium 81m Sestamibi. Rest ECG: NSR - Normal EKG  Stress Procedure:  The patient exercised on the treadmill utilizing the Bruce Protocol for 4:15 minutes. The patient stopped due to sob and had chest/throat burning as soon as she went into  recovery.  Technetium 52m Sestamibi was injected at peak exercise and myocardial perfusion imaging was performed after a brief delay. Stress ECG: No significant change from baseline ECG Rare PACs  QPS Raw Data Images:  There is a breast shadow that accounts for the anterior attenuation. Stress Images:  There is decreased uptake in the mid anterior wall. The apex and septum are spared Rest Images:  Comparison with the stress images reveals no significant change. Subtraction (SDS):  There is a fixed anterior defect that is most consistent with breast attenuation. Transient Ischemic Dilatation (Normal <1.22):  0.71 Lung/Heart Ratio (Normal <0.45):  0.26  Quantitative Gated Spect Images QGS EDV:  57 ml QGS ESV:  19 ml  Impression Exercise Capacity:  Fair exercise capacity. BP Response:  Normal blood pressure response. Clinical Symptoms:  Atypical chest pain. ECG Impression:  No significant ST segment change suggestive of ischemia. Comparison with Prior Nuclear Study: No previous nuclear study performed LV Ejection Fraction: 66%.  LV Wall Motion:  NL LV Function; NL Wall Motion  Overall Impression:  Low risk stress nuclear study with a very small anterior perfusion defect that most likely represents breast attenuation artifact. Less likely this could represent a true perfusion abnormality in the distribution of a diagonal artery branch of the LAD.   Sanda Klein, MD, [place] [phone]  office [phone] pager

## 2013-03-12 ENCOUNTER — Telehealth: Payer: Self-pay | Admitting: *Deleted

## 2013-03-12 DIAGNOSIS — R7989 Other specified abnormal findings of blood chemistry: Secondary | ICD-10-CM

## 2013-03-12 DIAGNOSIS — R1011 Right upper quadrant pain: Secondary | ICD-10-CM

## 2013-03-12 DIAGNOSIS — R945 Abnormal results of liver function studies: Principal | ICD-10-CM

## 2013-03-12 NOTE — Telephone Encounter (Signed)
I have spoken to Victoria [name], she is having pains suggestive of biliary colic ( had chole). Please schedule Sono RUQ " abnormal LFT's) for tomorrow - she is off tomorrow. Also set her up for LFT's Monday am 03/16/2013, she is planning to return to work. She was CLO negative on EGD 05/2010 so her positive H.Pylori indicates  just presence of antibody.Please send her Vicodin 5/325 # 20 , 1 po q 4-6 hours prn adb.pain.

## 2013-03-12 NOTE — Telephone Encounter (Signed)
Patient calls stating that she recently went to her PCP for chest pain so she is being evaluated for it. Lab work done as part of the work up showed elevated ALT and AST (03/10/13) as well as + H Pylori antibody. Patient is concerned about what medications to take for H Pylori since she has a history of C Diff in the past. Does patient need an office visit to discuss or do you want to speak with her when you see her next time?

## 2013-03-13 ENCOUNTER — Other Ambulatory Visit: Payer: Self-pay | Admitting: Internal Medicine

## 2013-03-13 ENCOUNTER — Ambulatory Visit (HOSPITAL_COMMUNITY)
Admission: RE | Admit: 2013-03-13 | Discharge: 2013-03-13 | Disposition: A | Discharge status: Home / Self Care | Payer: 59 | Source: Ambulatory Visit | Attending: Internal Medicine | Admitting: Internal Medicine

## 2013-03-13 DIAGNOSIS — R1011 Right upper quadrant pain: Secondary | ICD-10-CM

## 2013-03-13 DIAGNOSIS — R7989 Other specified abnormal findings of blood chemistry: Secondary | ICD-10-CM | POA: Insufficient documentation

## 2013-03-13 DIAGNOSIS — N3289 Other specified disorders of bladder: Secondary | ICD-10-CM | POA: Insufficient documentation

## 2013-03-13 DIAGNOSIS — N289 Disorder of kidney and ureter, unspecified: Secondary | ICD-10-CM | POA: Insufficient documentation

## 2013-03-13 DIAGNOSIS — R1013 Epigastric pain: Secondary | ICD-10-CM | POA: Insufficient documentation

## 2013-03-13 DIAGNOSIS — R945 Abnormal results of liver function studies: Secondary | ICD-10-CM

## 2013-03-13 DIAGNOSIS — Z9089 Acquired absence of other organs: Secondary | ICD-10-CM | POA: Insufficient documentation

## 2013-03-13 MED ORDER — HYDROCODONE-ACETAMINOPHEN 5-325 MG PO TABS
ORAL_TABLET | ORAL | Status: DC
Start: 2013-03-13 — End: 2013-07-02

## 2013-03-13 NOTE — Telephone Encounter (Signed)
Patient has been advised that she is scheduled for u/s today at [facility] @ 1 pm. NPO 6 hours prior. I have also advised to have repeat LFT's 03-16-13. I have created a script for vicodin and am awaiting Dr Nichola Sizer signature. Patient will come later today to pick that up.

## 2013-03-13 NOTE — Progress Notes (Signed)
Patient informed. 

## 2013-03-16 ENCOUNTER — Other Ambulatory Visit (INDEPENDENT_AMBULATORY_CARE_PROVIDER_SITE_OTHER): Payer: 59

## 2013-03-16 DIAGNOSIS — R945 Abnormal results of liver function studies: Principal | ICD-10-CM

## 2013-03-16 DIAGNOSIS — R7989 Other specified abnormal findings of blood chemistry: Secondary | ICD-10-CM

## 2013-03-16 DIAGNOSIS — R1011 Right upper quadrant pain: Secondary | ICD-10-CM

## 2013-03-16 LAB — HEPATIC FUNCTION PANEL
ALT: 26 U/L (ref 0–35)
AST: 17 U/L (ref 0–37)
Albumin: 3.9 g/dL (ref 3.5–5.2)
Alkaline Phosphatase: 65 U/L (ref 39–117)
Bilirubin, Direct: 0.1 mg/dL (ref 0.0–0.3)
Total Bilirubin: 0.7 mg/dL (ref 0.3–1.2)
Total Protein: 7.8 g/dL (ref 6.0–8.3)

## 2013-03-19 ENCOUNTER — Telehealth: Payer: Self-pay | Admitting: Cardiology

## 2013-03-19 ENCOUNTER — Other Ambulatory Visit: Payer: Self-pay | Admitting: General Surgery

## 2013-03-19 MED ORDER — PANTOPRAZOLE SODIUM 40 MG PO TBEC: 40.0000 mg | DELAYED_RELEASE_TABLET | Freq: Every day | ORAL | Status: DC

## 2013-03-19 NOTE — Telephone Encounter (Signed)
Spoke with pt and gave her the results

## 2013-03-19 NOTE — Telephone Encounter (Signed)
°  Patient is returning your call, please call back.  °

## 2013-03-25 ENCOUNTER — Encounter (HOSPITAL_COMMUNITY): Payer: 59

## 2013-03-26 ENCOUNTER — Other Ambulatory Visit: Payer: Self-pay | Admitting: Internal Medicine

## 2013-03-26 ENCOUNTER — Encounter: Payer: Self-pay | Admitting: Cardiology

## 2013-03-26 ENCOUNTER — Ambulatory Visit (HOSPITAL_COMMUNITY): Payer: 59 | Attending: Cardiology | Admitting: Radiology

## 2013-03-26 DIAGNOSIS — R0989 Other specified symptoms and signs involving the circulatory and respiratory systems: Secondary | ICD-10-CM | POA: Insufficient documentation

## 2013-03-26 DIAGNOSIS — R0602 Shortness of breath: Secondary | ICD-10-CM | POA: Insufficient documentation

## 2013-03-26 DIAGNOSIS — R072 Precordial pain: Secondary | ICD-10-CM

## 2013-03-26 DIAGNOSIS — I079 Rheumatic tricuspid valve disease, unspecified: Secondary | ICD-10-CM | POA: Insufficient documentation

## 2013-03-26 DIAGNOSIS — R0609 Other forms of dyspnea: Secondary | ICD-10-CM | POA: Insufficient documentation

## 2013-03-26 DIAGNOSIS — R079 Chest pain, unspecified: Secondary | ICD-10-CM | POA: Insufficient documentation

## 2013-03-26 NOTE — Progress Notes (Signed)
Echocardiogram performed.  

## 2013-03-27 NOTE — Telephone Encounter (Signed)
Added to lab report. Waiting on pt to return call. Will close this encounter since other is open

## 2013-03-27 NOTE — Telephone Encounter (Signed)
Please find out if patient has had any further CP

## 2013-03-28 NOTE — Patient Instructions (Signed)
Cleocin vaginal cream 1 applicator full in vagina at bedtime x7 days. GC and Chlamydia probes pending.

## 2013-04-20 ENCOUNTER — Encounter: Payer: Self-pay | Admitting: Cardiology

## 2013-04-20 ENCOUNTER — Ambulatory Visit (INDEPENDENT_AMBULATORY_CARE_PROVIDER_SITE_OTHER): Payer: 59 | Admitting: Cardiology

## 2013-04-20 ENCOUNTER — Ambulatory Visit: Payer: 59 | Admitting: Cardiology

## 2013-04-20 ENCOUNTER — Encounter (INDEPENDENT_AMBULATORY_CARE_PROVIDER_SITE_OTHER): Payer: Self-pay

## 2013-04-20 VITALS — BP 124/84 | HR 71 | Ht 67.0 in | Wt 276.0 lb

## 2013-04-20 DIAGNOSIS — K219 Gastro-esophageal reflux disease without esophagitis: Secondary | ICD-10-CM

## 2013-04-20 DIAGNOSIS — R079 Chest pain, unspecified: Secondary | ICD-10-CM

## 2013-04-20 NOTE — Patient Instructions (Signed)
Your physician recommends that you continue on your current medications as directed. Please refer to the Current Medication list given to you today.  Your physician recommends that you schedule a follow-up appointment in: 3 Months with Dr Radford Pax  Pt stated she would call to schedule.

## 2013-04-20 NOTE — Progress Notes (Signed)
[facility], [place], [place]  [address] Phone: [phone] Fax:  [phone]  Date:  04/20/2013   ID:  INFANT DOANE, DOB [birth year], MRN [id number]  PCP:  Elby Showers, MD  Cardiologist:  Fransico Him, MD     History:  Victoria [name] is a 53 y.o. female with a history of GERD and hiatal hernia presents today for followup of chest pain. She recently was me for complaints of chest pain in the past and it was thought to be GERD and had been placed on Protonix but would still get the CP every few months. When she saw me for initial evaluation she had had an episode of CP after taking pain pills that was nonexertional and associated with SOB and then got nauseated and vomited and her symptoms resolved.  Her EKG was normal.  She underwent nuclear stress test which showed low risk study with a very small anterior perfusion defect that most likely represents breast attenuation artifact. Less likely this could represent a true perfusion abnormality in the distribution of a diagonal artery branch of the LAD.  Her EF was normal.  She was started on Protonix for possible GERD symptoms and now presents back for followup.  She has not had any further episodes as severe as the episode she saw me for.  She saw her GI MD and her LFTs were elevated and her GI MD felt that she may be still forming gallstones despite no gallbladder.      Wt Readings from Last 3 Encounters:  04/20/13 276 lb (125.193 kg)  03/11/13 269 lb (122.018 kg)  03/11/13 269 lb 12.8 oz (122.38 kg)     Past Medical History  Diagnosis Date  . Hx of adenomatous colonic polyps 10/17/07  . Osteoarthritis     left hip  . Hydradenitis   . GERD (gastroesophageal reflux disease)   . Hypothyroidism   . Pernicious anemia   . Vitamin D deficiency   . HSV-2 infection   . Obesity   . Hiatal hernia   . C. difficile diarrhea 10/2010  . Hemorrhoids   . Varicose veins     Current Outpatient Prescriptions  Medication Sig  Dispense Refill  . aspirin 81 MG tablet Take 81 mg by mouth daily.      . Cholecalciferol (VITAMIN D) 2000 UNITS CAPS Take by mouth.      . cyanocobalamin (,VITAMIN B-12,) 1000 MCG/ML injection INJECT 1 ML INTRAMUSCULARLY EVERY MONTH  1 mL  1  . HYDROcodone-acetaminophen (NORCO/VICODIN) 5-325 MG per tablet Take 1 tablet by mouth every 4-6 hours as needed for abdominal pain  20 tablet  0  . HYDROmorphone (DILAUDID) 4 MG tablet Take 4 mg by mouth every 4 (four) hours as needed for pain.      Marland Kitchen ibuprofen (ADVIL,MOTRIN) 800 MG tablet TAKE 1 TABLET BY MOUTH THREE TIMES DAILY  270 tablet  3  . levothyroxine (SYNTHROID, LEVOTHROID) 150 MCG tablet TAKE 1 TABLET BY MOUTH ONCE DAILY  90 tablet  1  . pantoprazole (PROTONIX) 40 MG tablet Take 1 tablet (40 mg total) by mouth daily.  30 tablet  11  . valACYclovir (VALTREX) 500 MG tablet TAKE 1 TABLET BY MOUTH DAILY  90 tablet  PRN   No current facility-administered medications for this visit.    Allergies:   No Known Allergies  Social History:  The patient  reports that she has never smoked. She has never used smokeless tobacco. She reports that  she does not drink alcohol or use illicit drugs.   Family History:  The patient's family history includes Diabetes in her father and sister; Hypertension in her mother and sister.   ROS:  Please see the history of present illness.      All other systems reviewed and negative.   PHYSICAL EXAM: VS:  BP 124/84  Pulse 71  Ht 5\' 7"  (1.702 m)  Wt 276 lb (125.193 kg)  BMI 43.22 kg/m2     ASSESSMENT AND PLAN:   1.  Atypical chest pain with normal EKG, cardiac enzymes, low risk nuclear stress test and normal Echo.  Her symptoms are most likely GI and with recent elevated LFT's her GI MD feels she is still have biliary issues.  She will continue on Protonix and no further cardiac workup at this time. 2.  SOB with normal 2D echo evaluate - resolved  Followup with me in 3 months   Signed, Fransico Him,  MD 04/20/2013 4:01 PM

## 2013-05-07 ENCOUNTER — Telehealth: Payer: Self-pay | Admitting: *Deleted

## 2013-05-07 MED ORDER — PANTOPRAZOLE SODIUM 40 MG PO TBEC: 40.0000 mg | DELAYED_RELEASE_TABLET | Freq: Two times a day (BID) | ORAL | Status: DC

## 2013-05-07 NOTE — Telephone Encounter (Signed)
Patient requests BID protonix instead of qd. Per Dr Olevia Perches, patient may have BID protonix. Rx sent to pharmacy.

## 2013-06-02 ENCOUNTER — Telehealth: Payer: Self-pay | Admitting: Internal Medicine

## 2013-06-02 NOTE — Telephone Encounter (Signed)
To call us to inquire as to a cheaper alternative.  Please advise.  Patient states hot flashes are getting worse and she knows it's because she hasn't been taking anything.  She can't afford $75.  Thanks.

## 2013-06-02 NOTE — Telephone Encounter (Signed)
I do not know of a cheaper alternative. Vaughan Basta, please call the [facility] and inquire for this patient. Thanks.

## 2013-06-03 ENCOUNTER — Other Ambulatory Visit: Payer: Self-pay

## 2013-06-03 MED ORDER — ESTRADIOL 1 MG PO TABS: 1.0000 mg | ORAL_TABLET | Freq: Every day | ORAL | Status: DC

## 2013-06-03 NOTE — Telephone Encounter (Signed)
Spoke with a Software engineer at [employer]. Alternatives to Premarin are Estradiol tabs or an estrogen patch, ie generic Vivelle. Per verbal order of Dr. Renold Genta, will Rx Estradiol 1mg  tabs, 1 po daily. Sent to [facility]. Patient informed.

## 2013-06-15 ENCOUNTER — Other Ambulatory Visit: Payer: Self-pay | Admitting: Internal Medicine

## 2013-07-02 ENCOUNTER — Ambulatory Visit (INDEPENDENT_AMBULATORY_CARE_PROVIDER_SITE_OTHER): Payer: 59 | Admitting: Internal Medicine

## 2013-07-02 ENCOUNTER — Encounter: Payer: Self-pay | Admitting: Internal Medicine

## 2013-07-02 VITALS — BP 136/80 | HR 56 | Temp 97.9°F | Wt 263.0 lb

## 2013-07-02 DIAGNOSIS — R51 Headache: Secondary | ICD-10-CM

## 2013-07-02 LAB — CBC WITH DIFFERENTIAL/PLATELET
Basophils Absolute: 0 10*3/uL (ref 0.0–0.1)
Eosinophils Absolute: 0 10*3/uL (ref 0.0–0.7)
HCT: 35.3 % — ABNORMAL LOW (ref 36.0–46.0)
Hemoglobin: 11.5 g/dL — ABNORMAL LOW (ref 12.0–15.0)
Lymphocytes Relative: 41 % (ref 12–46)
Lymphs Abs: 1.5 10*3/uL (ref 0.7–4.0)
MCH: 30.7 pg (ref 26.0–34.0)
MCHC: 32.7 g/dL (ref 30.0–36.0)
MCV: 94.3 fL (ref 78.0–100.0)
Monocytes Absolute: 0.3 10*3/uL (ref 0.1–1.0)
Monocytes Relative: 8 % (ref 3–12)
Neutro Abs: 1.8 10*3/uL (ref 1.7–7.7)
Neutrophils Relative %: 51 % (ref 43–77)
Platelets: 184 10*3/uL (ref 150–400)
RBC: 3.74 MIL/uL — ABNORMAL LOW (ref 3.87–5.11)
RDW: 14.4 % (ref 11.5–15.5)
WBC: 3.6 10*3/uL — ABNORMAL LOW (ref 4.0–10.5)

## 2013-07-02 MED ORDER — METHYLPREDNISOLONE 4 MG PO KIT: PACK | ORAL | Status: DC

## 2013-07-02 MED ORDER — HYDROCODONE-ACETAMINOPHEN 5-325 MG PO TABS: ORAL_TABLET | ORAL | Status: DC

## 2013-07-02 NOTE — Progress Notes (Signed)
Patient informed. 

## 2013-07-02 NOTE — Progress Notes (Signed)
   Subjective:    Patient ID: Victoria [name], female    DOB: 1960-11-24, 53 y.o.   MRN: [id number]  HPI   Three-day history of headache that started Monday evening. Has been persistent ever since not responding to Motrin 800 mg daily. Felt a little better yesterday but then awakened this morning and headache had returned. Felt a little nauseated today. Was driving recently to orthopedist office in turn down wrong street and that worried her. She is planning a wedding for her daughter later this summer. She's excepted proposal for long time boyfriend and will be planning to get married next year sometime. There is a lot going on. Says job is okay. No longer working third she have to. Says she's sleeping okay but sometimes wakes up around 2 or 3 AM for couple of hours and then goes back to sleep. 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 Does have a prior history of headache but has never frankly been diagnosed with migraine although at times she would have photophobia with her headaches. No numbness or tingling. No vision disturbances. History of B12 deficiency which she treats with monthly with B12 injections.  Review of Systems     Objective:   Physical Exam funduscopic exam is benign. Disc are sharp and flat bilaterally and vasculature is normal. TMs are clear. Pharynx is clear. Neck is supple without adenopathy. She is very tight and tender in her paracervical muscles bilaterally. Muscle strength is normal. No focal deficits on brief neurological exam.         Assessment & Plan:   Protracted headache-neurological exam is normal. CBC with differential pending.  Plan: Medrol 4 mg 6 day dosepak to take in tapering course as directed. Norco 5/325 one by mouth every 6 hours when necessary pain. Ice neck down for 20 minutes once or twice daily.  25 minutes spent with patient

## 2013-07-02 NOTE — Patient Instructions (Signed)
Take Norco and Medrol as prescribed. Call if not better in 48 hours or sooner if worse.

## 2013-08-20 ENCOUNTER — Encounter: Payer: Self-pay | Admitting: Internal Medicine

## 2013-08-20 ENCOUNTER — Ambulatory Visit (INDEPENDENT_AMBULATORY_CARE_PROVIDER_SITE_OTHER): Payer: 59 | Admitting: Internal Medicine

## 2013-08-20 VITALS — BP 114/86 | HR 76 | Temp 98.3°F | Wt 271.0 lb

## 2013-08-20 DIAGNOSIS — E039 Hypothyroidism, unspecified: Secondary | ICD-10-CM

## 2013-08-20 DIAGNOSIS — M542 Cervicalgia: Secondary | ICD-10-CM

## 2013-08-20 DIAGNOSIS — Z131 Encounter for screening for diabetes mellitus: Secondary | ICD-10-CM

## 2013-08-20 DIAGNOSIS — M7918 Myalgia, other site: Secondary | ICD-10-CM

## 2013-08-20 DIAGNOSIS — IMO0001 Reserved for inherently not codable concepts without codable children: Secondary | ICD-10-CM

## 2013-08-20 DIAGNOSIS — E538 Deficiency of other specified B group vitamins: Secondary | ICD-10-CM

## 2013-08-20 MED ORDER — HYDROCODONE-ACETAMINOPHEN 5-325 MG PO TABS: ORAL_TABLET | ORAL | Status: DC

## 2013-08-20 MED ORDER — CYCLOBENZAPRINE HCL 10 MG PO TABS: 10.0000 mg | ORAL_TABLET | Freq: Every day | ORAL | Status: DC

## 2013-08-20 NOTE — Addendum Note (Signed)
Addended by: Brett Canales on: 08/20/2013 05:02 PM   Modules accepted: Orders

## 2013-08-20 NOTE — Patient Instructions (Addendum)
Consider attending informational sessions for gastric bypass surgery. Continue diet exercise and weight loss efforts. Return in 6 months. Take Flexeril at bedtime for neck pain. Hydrocodone/APAP prescribed for musculoskeletal pain. TSH and hemoglobin A1c pending. Take B12 injections every month. Handicap parking permit signed.

## 2013-08-20 NOTE — Progress Notes (Signed)
   Subjective:    Patient ID: Victoria [name], female    DOB: 08-Apr-1960, 53 y.o.   MRN: [id number]  HPI In today for six-month recheck. TSH drawn followup of hypothyroidism. At her request, hemoglobin A1c drawn as well. She is limping today. Says she has left anterior thigh pain from time to time. She's not sure why. Orthopedist  Did x-ray of her left femur  finding no abnormality. She is status post bilateral hip replacements and the last one was done on the right about a year ago and has been slow to improve. She says it feels stiff quite a bit. Her neck is hurting today. Doesn't recall any particular injury or sleeping incorrectly on her neck. Says she has soreness in her left paracervical muscle area. Asking for pain medication and muscle relaxant. She's only been taking B12 injections every other month. Needs to take for monthly. Blood pressure is under good control. She has considered gastric bypass surgery and has begun to attend some informational sessions but hasn't made a decision yet. Daughter is getting married in August. Patient also is engaged and will be getting married in the near future.    Review of Systems     Objective:   Physical Exam Neck is supple without thyromegaly. Chest clear. Cardiac exam regular rate and rhythm. She has palpable left paracervical muscle spasm. Extremities without edema. Walks with a limp.       Assessment & Plan:  Musculoskeletal pain-left anterior thigh and right hip. Handicap parking permit signed and her request.  Morbid obesity-considering gastric bypass surgery. Continue diet exercise and weight loss efforts. BMI is 42.  Hypothyroidism-TSH drawn and pending on thyroid replacement therapy  Neck pain-prescribed Flexeril and hydrocodone/APAP. Apply ice for 20 minutes twice daily.  Plan: Continue same medications and resume B12 injections monthly instead of every other month. Hemoglobin A1c and TSH pending. Return December 2015 for physical  examination. Take Flexeril for neck pain and hydrocodone/APAP for musculoskeletal pain.

## 2013-08-21 LAB — HEMOGLOBIN A1C
Hgb A1c MFr Bld: 5.7 % — ABNORMAL HIGH (ref <5.7)
Mean Plasma Glucose: 117 mg/dL — ABNORMAL HIGH (ref <117)

## 2013-08-21 LAB — TSH: TSH: 0.586 u[IU]/mL (ref 0.350–4.500)

## 2013-09-14 ENCOUNTER — Other Ambulatory Visit: Payer: Self-pay | Admitting: Internal Medicine

## 2013-11-10 ENCOUNTER — Other Ambulatory Visit: Payer: Self-pay | Admitting: Internal Medicine

## 2013-11-12 ENCOUNTER — Encounter: Payer: Self-pay | Admitting: Internal Medicine

## 2013-11-12 ENCOUNTER — Ambulatory Visit (INDEPENDENT_AMBULATORY_CARE_PROVIDER_SITE_OTHER): Payer: 59 | Admitting: Internal Medicine

## 2013-11-12 VITALS — BP 110/80 | HR 82 | Ht 67.0 in | Wt 271.0 lb

## 2013-11-12 DIAGNOSIS — Z23 Encounter for immunization: Secondary | ICD-10-CM

## 2013-11-12 DIAGNOSIS — M545 Low back pain, unspecified: Secondary | ICD-10-CM

## 2013-11-12 MED ORDER — CYCLOBENZAPRINE HCL 10 MG PO TABS: 10.0000 mg | ORAL_TABLET | Freq: Every day | ORAL | Status: DC

## 2013-11-12 MED ORDER — MELOXICAM 15 MG PO TABS: 15.0000 mg | ORAL_TABLET | Freq: Every day | ORAL | Status: DC

## 2013-11-12 MED ORDER — HYDROCODONE-ACETAMINOPHEN 10-325 MG PO TABS: 1.0000 | ORAL_TABLET | Freq: Three times a day (TID) | ORAL | Status: DC | PRN

## 2013-11-12 NOTE — Patient Instructions (Signed)
See orthopedist about hip weakness. Take pain medications sparingly. Try Mobic 15 mg daily for back and hip pain

## 2013-11-12 NOTE — Progress Notes (Signed)
   Subjective:    Patient ID: Victoria [name], female    DOB: 1960-11-15, 53 y.o.   MRN: [id number]  HPI  Says that over Labor Day weekend she had severe left back pain. Had issues moving about. Doesn't know what caused it. She has had bilateral hip replacement surgery. Back pain was over her left posterior superior iliac spine. It did not radiate into her leg.  She still considering gastric bypass surgery. Frustrated with inability to lose weight but yesterday ate one piece of fried chicken and [place] food. Says she's really not able to exercise because her hips don't feel exactly right. She needs to go back to [facility] and find out why one hip feels weak to her.  She is asking about other weight loss medications. Am not comfortable prescribing them. Says she cannot tolerate phentermine. She is asking about injections for weight loss. Am not comfortable with that either.  Says back pain got better with ibuprofen and Flexeril. She is almost out of Norco.    Review of Systems     Objective:   Physical Exam   Straight leg raising is negative on the left at 90 he muscle strength in the lower extremities is normal     Assessment & Plan:  Left hip pain-likely due to back issues suspect musculoskeletal pain  Bilateral hip replacements  Obesity  Hypothyroidism  Plan: Norco 10/325 #60 with no refill. Start Mobic 15 mg daily. Refill Flexeril. Consider gastric bypass surgery. Says I need to write  a letter recommending gastric by-pass surgery.  Told her she needed to give weight loss a try because I had not seen her weight get better. Says she's been to dietitian but that did not help her very much.

## 2013-11-26 ENCOUNTER — Encounter: Payer: Self-pay | Admitting: Internal Medicine

## 2013-11-27 ENCOUNTER — Other Ambulatory Visit: Payer: Self-pay | Admitting: Internal Medicine

## 2013-12-10 ENCOUNTER — Telehealth: Payer: Self-pay

## 2013-12-10 ENCOUNTER — Other Ambulatory Visit: Payer: Self-pay

## 2013-12-10 MED ORDER — SUCRALFATE 1 GM/10ML PO SUSP: 1.0000 g | Freq: Two times a day (BID) | ORAL | Status: DC

## 2013-12-10 NOTE — Telephone Encounter (Signed)
Message copied by Algernon Huxley on Thu Dec 10, 2013  2:03 PM ------      Message from: [place] Dragon      Created: Thu Dec 10, 2013  1:53 PM      Regarding: Carafate       Hello Victoria [name], could you, please , send Carafate slurry ,14 oz, 10cc po bid , 1 refill, thanx ------

## 2013-12-10 NOTE — Telephone Encounter (Signed)
Script sent to pharmacy.

## 2014-02-22 ENCOUNTER — Encounter: Payer: Self-pay | Admitting: Internal Medicine

## 2014-02-22 ENCOUNTER — Ambulatory Visit (INDEPENDENT_AMBULATORY_CARE_PROVIDER_SITE_OTHER): Payer: 59 | Admitting: Internal Medicine

## 2014-02-22 ENCOUNTER — Other Ambulatory Visit: Payer: 59 | Admitting: Internal Medicine

## 2014-02-22 VITALS — BP 134/70 | HR 77 | Temp 97.9°F | Wt 275.0 lb

## 2014-02-22 DIAGNOSIS — E669 Obesity, unspecified: Secondary | ICD-10-CM

## 2014-02-22 DIAGNOSIS — M545 Low back pain, unspecified: Secondary | ICD-10-CM

## 2014-02-22 DIAGNOSIS — Z0000 Encounter for general adult medical examination without abnormal findings: Secondary | ICD-10-CM

## 2014-02-22 DIAGNOSIS — E890 Postprocedural hypothyroidism: Secondary | ICD-10-CM

## 2014-02-22 DIAGNOSIS — Z8669 Personal history of other diseases of the nervous system and sense organs: Secondary | ICD-10-CM

## 2014-02-22 DIAGNOSIS — Z8639 Personal history of other endocrine, nutritional and metabolic disease: Secondary | ICD-10-CM | POA: Diagnosis not present

## 2014-02-22 DIAGNOSIS — M170 Bilateral primary osteoarthritis of knee: Secondary | ICD-10-CM

## 2014-02-22 DIAGNOSIS — Z1321 Encounter for screening for nutritional disorder: Secondary | ICD-10-CM

## 2014-02-22 DIAGNOSIS — Z8719 Personal history of other diseases of the digestive system: Secondary | ICD-10-CM

## 2014-02-22 DIAGNOSIS — Z1329 Encounter for screening for other suspected endocrine disorder: Secondary | ICD-10-CM

## 2014-02-22 DIAGNOSIS — D126 Benign neoplasm of colon, unspecified: Secondary | ICD-10-CM

## 2014-02-22 DIAGNOSIS — Z13220 Encounter for screening for lipoid disorders: Secondary | ICD-10-CM

## 2014-02-22 DIAGNOSIS — Z130 Encounter for screening for diseases of the blood and blood-forming organs and certain disorders involving the immune mechanism: Secondary | ICD-10-CM

## 2014-02-22 DIAGNOSIS — Z96643 Presence of artificial hip joint, bilateral: Secondary | ICD-10-CM

## 2014-02-22 DIAGNOSIS — Z8619 Personal history of other infectious and parasitic diseases: Secondary | ICD-10-CM | POA: Diagnosis not present

## 2014-02-22 DIAGNOSIS — Z90710 Acquired absence of both cervix and uterus: Secondary | ICD-10-CM

## 2014-02-22 DIAGNOSIS — IMO0001 Reserved for inherently not codable concepts without codable children: Secondary | ICD-10-CM

## 2014-02-22 LAB — COMPREHENSIVE METABOLIC PANEL
ALT: 10 U/L (ref 0–35)
AST: 13 U/L (ref 0–37)
Albumin: 3.7 g/dL (ref 3.5–5.2)
Alkaline Phosphatase: 66 U/L (ref 39–117)
BUN: 11 mg/dL (ref 6–23)
CO2: 29 mEq/L (ref 19–32)
Calcium: 9.3 mg/dL (ref 8.4–10.5)
Chloride: 103 mEq/L (ref 96–112)
Creat: 0.88 mg/dL (ref 0.50–1.10)
Glucose, Bld: 87 mg/dL (ref 70–99)
Potassium: 4.4 mEq/L (ref 3.5–5.3)
Sodium: 140 mEq/L (ref 135–145)
Total Bilirubin: 0.4 mg/dL (ref 0.2–1.2)
Total Protein: 7 g/dL (ref 6.0–8.3)

## 2014-02-22 LAB — LIPID PANEL
Cholesterol: 184 mg/dL (ref 0–200)
HDL: 63 mg/dL (ref >39)
LDL Cholesterol: 109 mg/dL — ABNORMAL HIGH (ref 0–99)
Total CHOL/HDL Ratio: 2.9 Ratio
Triglycerides: 58 mg/dL (ref <150)
VLDL: 12 mg/dL (ref 0–40)

## 2014-02-22 LAB — CBC WITH DIFFERENTIAL/PLATELET
Basophils Absolute: 0 10*3/uL (ref 0.0–0.1)
Basophils Relative: 0 % (ref 0–1)
Eosinophils Absolute: 0.1 10*3/uL (ref 0.0–0.7)
Eosinophils Relative: 3 % (ref 0–5)
HCT: 35.1 % — ABNORMAL LOW (ref 36.0–46.0)
Hemoglobin: 11.6 g/dL — ABNORMAL LOW (ref 12.0–15.0)
Lymphocytes Relative: 29 % (ref 12–46)
Lymphs Abs: 1.4 10*3/uL (ref 0.7–4.0)
MCH: 30.4 pg (ref 26.0–34.0)
MCHC: 33 g/dL (ref 30.0–36.0)
MCV: 92.1 fL (ref 78.0–100.0)
MPV: 10.7 fL (ref 9.4–12.4)
Monocytes Absolute: 0.3 10*3/uL (ref 0.1–1.0)
Monocytes Relative: 6 % (ref 3–12)
Neutro Abs: 3 10*3/uL (ref 1.7–7.7)
Neutrophils Relative %: 62 % (ref 43–77)
Platelets: 236 10*3/uL (ref 150–400)
RBC: 3.81 MIL/uL — ABNORMAL LOW (ref 3.87–5.11)
RDW: 15 % (ref 11.5–15.5)
WBC: 4.9 10*3/uL (ref 4.0–10.5)

## 2014-02-22 MED ORDER — CYANOCOBALAMIN 1000 MCG/ML IJ SOLN: INTRAMUSCULAR | Status: DC

## 2014-02-22 MED ORDER — SUMATRIPTAN SUCCINATE 100 MG PO TABS: ORAL_TABLET | ORAL | Status: DC

## 2014-02-22 MED ORDER — HYDROCODONE-ACETAMINOPHEN 10-325 MG PO TABS: 1.0000 | ORAL_TABLET | Freq: Three times a day (TID) | ORAL | Status: DC | PRN

## 2014-02-22 MED ORDER — PROMETHAZINE HCL 25 MG PO TABS: 25.0000 mg | ORAL_TABLET | Freq: Three times a day (TID) | ORAL | Status: DC | PRN

## 2014-02-22 NOTE — Patient Instructions (Addendum)
Return in 6 months. Continue B12 injections at home monthly. Order MRI of LS spine.

## 2014-02-22 NOTE — Progress Notes (Signed)
Patient refused to give urine sample for urinalysis Dr Renold Genta made aware

## 2014-02-23 LAB — VITAMIN D 25 HYDROXY (VIT D DEFICIENCY, FRACTURES): Vit D, 25-Hydroxy: 19 ng/mL — ABNORMAL LOW (ref 30–100)

## 2014-02-23 LAB — VITAMIN B12: Vitamin B-12: 425 pg/mL (ref 211–911)

## 2014-02-23 LAB — TSH: TSH: 0.266 u[IU]/mL — ABNORMAL LOW (ref 0.350–4.500)

## 2014-02-24 ENCOUNTER — Telehealth: Payer: Self-pay | Admitting: *Deleted

## 2014-02-24 DIAGNOSIS — M545 Low back pain, unspecified: Secondary | ICD-10-CM

## 2014-02-24 NOTE — Telephone Encounter (Signed)
Spoke with patient insurance company no precert needed for MRI LS . Patient scheduled for Jan 9,2016 at 8:10 am at [facility] location . Patient notified and given appt information.

## 2014-02-24 NOTE — Telephone Encounter (Signed)
Reviewed lab results with patient.

## 2014-03-06 ENCOUNTER — Other Ambulatory Visit: Payer: 59

## 2014-03-23 ENCOUNTER — Other Ambulatory Visit: Payer: Self-pay | Admitting: Internal Medicine

## 2014-04-05 ENCOUNTER — Other Ambulatory Visit: Payer: Self-pay | Admitting: Internal Medicine

## 2014-04-05 DIAGNOSIS — Z1231 Encounter for screening mammogram for malignant neoplasm of breast: Secondary | ICD-10-CM

## 2014-04-08 ENCOUNTER — Ambulatory Visit (HOSPITAL_COMMUNITY)
Admission: RE | Admit: 2014-04-08 | Discharge: 2014-04-08 | Disposition: A | Discharge status: Home / Self Care | Payer: 59 | Source: Ambulatory Visit | Attending: Internal Medicine | Admitting: Internal Medicine

## 2014-04-08 ENCOUNTER — Other Ambulatory Visit (INDEPENDENT_AMBULATORY_CARE_PROVIDER_SITE_OTHER): Payer: Self-pay

## 2014-04-08 DIAGNOSIS — Z1231 Encounter for screening mammogram for malignant neoplasm of breast: Secondary | ICD-10-CM | POA: Diagnosis not present

## 2014-04-23 ENCOUNTER — Encounter (HOSPITAL_COMMUNITY)
Admission: RE | Disposition: A | Discharge status: Home / Self Care | Payer: Self-pay | Source: Ambulatory Visit | Attending: Surgery

## 2014-04-23 ENCOUNTER — Ambulatory Visit (HOSPITAL_COMMUNITY)
Admission: RE | Admit: 2014-04-23 | Discharge: 2014-04-23 | Disposition: A | Discharge status: Home / Self Care | Payer: 59 | Source: Ambulatory Visit | Attending: Surgery | Admitting: Surgery

## 2014-04-23 ENCOUNTER — Other Ambulatory Visit: Payer: Self-pay

## 2014-04-23 DIAGNOSIS — K219 Gastro-esophageal reflux disease without esophagitis: Secondary | ICD-10-CM | POA: Insufficient documentation

## 2014-04-23 SURGERY — BREATH TEST, FOR HELICOBACTER PYLORI

## 2014-05-01 ENCOUNTER — Encounter: Payer: 59 | Attending: Surgery | Admitting: Dietician

## 2014-05-01 ENCOUNTER — Encounter: Payer: Self-pay | Admitting: Dietician

## 2014-05-01 DIAGNOSIS — Z6841 Body Mass Index (BMI) 40.0 and over, adult: Secondary | ICD-10-CM | POA: Diagnosis not present

## 2014-05-01 DIAGNOSIS — Z713 Dietary counseling and surveillance: Secondary | ICD-10-CM | POA: Insufficient documentation

## 2014-05-01 NOTE — Patient Instructions (Addendum)
Follow Pre-Op Goals Try Protein Shakes ([email or web address]) Call [facility] at [phone] when surgery is scheduled to enroll in Pre-Op Class  Things to remember:  Please always be honest with [place]. We want to support you!  If you have any questions or concerns in between appointments, please call or email Ferol Luz, or Margarita Grizzle.  The diet after surgery will be high protein and low in carbohydrate.  Vitamins and calcium need to be taken for the rest of your life.  Feel free to include support people in any classes or appointments.

## 2014-05-01 NOTE — Progress Notes (Signed)
  Pre-Op Assessment Visit:  Pre-Operative RYGB Surgery  Medical Nutrition Therapy:  Appt start time: 6759  End time:  1638.  Patient was seen on 05/01/2014 for Pre-Operative Nutrition Assessment. Assessment and letter of approval faxed to [facility] Surgery Bariatric Surgery Program coordinator on 3/5/206.   Preferred Learning Style:   No preference indicated   Learning Readiness:   Ready  Handouts given during visit include:  Pre-Op Goals Bariatric Surgery Protein Shakes   During the appointment today the following Pre-Op Goals were reviewed with the patient: Maintain or lose weight as instructed by your surgeon Make healthy food choices Begin to limit portion sizes Limited concentrated sugars and fried foods Keep fat/sugar in the single digits per serving on   food labels Practice CHEWING your food  (aim for 30 chews per bite or until applesauce consistency) Practice not drinking 15 minutes before, during, and 30 minutes after each meal/snack Avoid all carbonated beverages  Avoid/limit caffeinated beverages  Avoid all sugar-sweetened beverages Consume 3 meals per day; eat every 3-5 hours Make a list of non-food related activities Aim for 64-100 ounces of FLUID daily  Aim for at least 60-80 grams of PROTEIN daily Look for a liquid protein source that contain ?15 g protein and ?5 g carbohydrate  (ex: shakes, drinks, shots)  Patient-Centered Goals: Victoria [name] would like to revamp her eating patterns with the surgery. 10 confidence and importance  Demonstrated degree of understanding via:  Teach Back  Teaching Method Utilized:  Visual Auditory Hands on  Barriers to learning/adherence to lifestyle change: busy schedule  Patient to call the [facility] to enroll in Pre-Op and Post-Op Nutrition Education when surgery date is scheduled.

## 2014-05-03 ENCOUNTER — Encounter (HOSPITAL_COMMUNITY)
Admission: RE | Disposition: A | Discharge status: Home / Self Care | Payer: Self-pay | Source: Ambulatory Visit | Attending: Surgery

## 2014-05-03 ENCOUNTER — Ambulatory Visit (HOSPITAL_COMMUNITY)
Admission: RE | Admit: 2014-05-03 | Discharge: 2014-05-03 | Disposition: A | Discharge status: Home / Self Care | Payer: 59 | Source: Ambulatory Visit | Attending: Surgery | Admitting: Surgery

## 2014-05-03 HISTORY — PX: BREATH TEK H PYLORI: SHX5422

## 2014-05-03 SURGERY — BREATH TEST, FOR HELICOBACTER PYLORI

## 2014-05-03 NOTE — Progress Notes (Signed)
   05/03/14 [place]  Referring MD Dr Leisa Lenz  Time of Last PO Intake 2030  Baseline Breath At: 0713  Pranactin Given At: 5035  Post-Dose Breath At: 0731  Sample 1 1.8%  Sample 2 1.7%  Test Negative

## 2014-05-04 ENCOUNTER — Encounter (HOSPITAL_COMMUNITY): Payer: Self-pay | Admitting: Surgery

## 2014-05-11 ENCOUNTER — Ambulatory Visit: Payer: 59 | Admitting: Dietician

## 2014-05-25 NOTE — Progress Notes (Signed)
Subjective:    Victoria [name] ID: Victoria [name], female    DOB: July 29, 1960, 54 y.o.   MRN: [id number]  HPI  54 year old Black Female long-standing Victoria [name] in this practice in today for health maintenance exam and evaluation of medical issues. She is considering having weight loss surgery. She asked that I write a letter for her endorsing the weight loss surgery which I have done.  She had a thyroidectomy 1980 and is on thyroid replacement therapy. Cholecystectomy 1987. History of adenomatous colon polyp. Last colonoscopy was 2009. C-section for twins 54. Bilateral tubal ligation in 1980s. Told him we'll hysterectomy 2004. Left hip replacement December 2010. Right hip replacement March 2014.  History of B-12 deficiency. She gives herself monthly B-12 injections.  History of hidradenitis suppurativa.  History of H. pylori infection 2001. History of C. difficile infection treated by Dr. Olevia Perches in 2012. Had upper endoscopy 2012 showing chronic duodenitis. Has been treated with PPI. History of GE reflux.  Social history: She is divorced. Is now engaged to be remarried. Does not smoke or consume alcohol. 2 adult when daughters. 3 grandchildren. Does not have much time for exercise. 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 She also helps Dr. [name] and has worked in a nursing facility part-time on the weekends.  Family history: Mother still living with history of diabetes and hypertension. Father living with history of diabetes. 2 brothers and 1 sister. Sister has hypothyroidism.       Review of Systems  Constitutional: Positive for fatigue.  Gastrointestinal:       History of GE reflux  Genitourinary: Negative.   Neurological:       Occasional headaches  Psychiatric/Behavioral: Negative.        Objective:   Physical Exam  Constitutional: She is oriented to person, place, and time. She appears well-developed and well-nourished. No distress.  HENT:    Head: Normocephalic and atraumatic.  Right Ear: External ear normal.  Left Ear: External ear normal.  Mouth/Throat: Oropharynx is clear and moist. No oropharyngeal exudate.  Eyes: Conjunctivae and EOM are normal. Pupils are equal, round, and reactive to light. Right eye exhibits no discharge. Left eye exhibits no discharge. No scleral icterus.  Neck: Neck supple. No JVD present. No thyromegaly present.  Cardiovascular: Normal rate, regular rhythm and normal heart sounds.   No murmur heard. Pulmonary/Chest: Effort normal and breath sounds normal. No respiratory distress. She has no wheezes. She has no rales. She exhibits no tenderness.  Breasts normal female without masses  Abdominal: Soft. Bowel sounds are normal. She exhibits no distension and no mass. There is no tenderness. There is no rebound and no guarding.  Genitourinary:  Status post total abdominal hysterectomy. Bimanual normal.  Musculoskeletal: Normal range of motion. She exhibits no edema.  Neurological: She is alert and oriented to person, place, and time. She has normal reflexes. No cranial nerve deficit. Coordination normal.  Skin: Skin is warm and dry. No rash noted. She is not diaphoretic.  Psychiatric: She has a normal mood and affect. Her behavior is normal. Judgment and thought content normal.  Vitals reviewed.         Assessment & Plan:  Obesity-considering weight loss surgery. I have written letter endorsing this.  Hypothyroidism status post thyroidectomy  History of hysterectomy  History of hidradenitis suppurativa  History of GE reflux  History of C. difficile  History of H. pylori  History of adenomatous colon polyps per if  history of B-12 deficiency  Status post bilateral hip replacements  Plan: Victoria [name] is planning bariatric weight loss surgery for Spring 2016. Return in 6 months. She is having issues with back pain with potential radiculopathy. Order MRI of the LS-spine.  Addendum: Victoria [name] has  not had MRI of the LS-spine as of 05/25/2014

## 2014-06-21 ENCOUNTER — Encounter: Payer: 59 | Attending: Surgery

## 2014-06-21 VITALS — Ht 66.5 in | Wt 288.5 lb

## 2014-06-21 DIAGNOSIS — Z713 Dietary counseling and surveillance: Secondary | ICD-10-CM | POA: Diagnosis not present

## 2014-06-21 DIAGNOSIS — E669 Obesity, unspecified: Secondary | ICD-10-CM

## 2014-06-21 DIAGNOSIS — Z6841 Body Mass Index (BMI) 40.0 and over, adult: Secondary | ICD-10-CM | POA: Insufficient documentation

## 2014-06-22 NOTE — Progress Notes (Signed)
Please put orders in Epic surgery 07-06-14 pre op 06-25-14 Thanks

## 2014-06-23 NOTE — Progress Notes (Signed)
Dr Hassell Done NEED PRE OP ORDERS PLEASE----HAS PST APPT 06/25/14 Thanks

## 2014-06-23 NOTE — Progress Notes (Signed)
  Pre-Operative Nutrition Class:  Appt start time: 8473   End time:  1830.  Patient was seen on 06/21/14 for Pre-Operative Bariatric Surgery Education at [facility].   Surgery date:  Surgery type: RYGB Start weight at [facility]: 286 lbs on 05/01/14 Weight today: 288.5 lbs  TANITA  BODY COMP RESULTS  06/21/14   BMI (kg/m^2) 45.9   Fat Mass (lbs) 154.5   Fat Free Mass (lbs) 134   Total Body Water (lbs) 98   Samples given per MNT protocol. Patient educated on appropriate usage: Premier protein shake (strawberry - qty 1) Lot #: [id number] Exp: 09/2014  Unjury protein powder (chocolate - qty 1) Lot #: [id number] Exp: 11/2014  PB2 (chocolate - qty 1) Lot #: none Exp: 12/2014  Bariatric Advantage Calcium Citrate chew (caramel - qty 1) Lot #: [id number] Exp: 06/2014    The following the learning objectives were met by the patient during this course:  Identify Pre-Op Dietary Goals and will begin 2 weeks pre-operatively  Identify appropriate sources of fluids and proteins   State protein recommendations and appropriate sources pre and post-operatively  Identify Post-Operative Dietary Goals and will follow for 2 weeks post-operatively  Identify appropriate multivitamin and calcium sources  Describe the need for physical activity post-operatively and will follow MD recommendations  State when to call healthcare provider regarding medication questions or post-operative complications  Handouts given during class include:  Pre-Op Bariatric Surgery Diet Handout  Protein Shake Handout  Post-Op Bariatric Surgery Nutrition Handout  BELT Program Information Flyer  Support Group Information Flyer  [facility] Bariatric Supplements Price List  Follow-Up Plan: Patient will follow-up at [facility] 2 weeks post operatively for diet advancement per MD.

## 2014-06-25 ENCOUNTER — Encounter (HOSPITAL_COMMUNITY): Payer: Self-pay

## 2014-06-25 ENCOUNTER — Encounter (HOSPITAL_COMMUNITY)
Admission: RE | Admit: 2014-06-25 | Discharge: 2014-06-25 | Disposition: A | Discharge status: Home / Self Care | Payer: 59 | Source: Ambulatory Visit | Attending: Surgery | Admitting: Surgery

## 2014-06-25 DIAGNOSIS — Z01812 Encounter for preprocedural laboratory examination: Secondary | ICD-10-CM | POA: Insufficient documentation

## 2014-06-25 HISTORY — DX: Headache, unspecified: R51.9

## 2014-06-25 HISTORY — DX: Headache: R51

## 2014-06-25 HISTORY — DX: Other specified postprocedural states: R11.2

## 2014-06-25 HISTORY — DX: Nausea with vomiting, unspecified: Z98.890

## 2014-06-25 LAB — CBC
HCT: 36.1 % (ref 36.0–46.0)
Hemoglobin: 11.8 g/dL — ABNORMAL LOW (ref 12.0–15.0)
MCH: 29.8 pg (ref 26.0–34.0)
MCHC: 32.7 g/dL (ref 30.0–36.0)
MCV: 91.2 fL (ref 78.0–100.0)
Platelets: 248 10*3/uL (ref 150–400)
RBC: 3.96 MIL/uL (ref 3.87–5.11)
RDW: 14 % (ref 11.5–15.5)
WBC: 4.5 10*3/uL (ref 4.0–10.5)

## 2014-06-25 LAB — COMPREHENSIVE METABOLIC PANEL
ALT: 24 U/L (ref 0–35)
AST: 29 U/L (ref 0–37)
Albumin: 4.3 g/dL (ref 3.5–5.2)
Alkaline Phosphatase: 61 U/L (ref 39–117)
Anion gap: 11 (ref 5–15)
BUN: 25 mg/dL — ABNORMAL HIGH (ref 6–23)
CO2: 24 mmol/L (ref 19–32)
Calcium: 9.5 mg/dL (ref 8.4–10.5)
Chloride: 103 mmol/L (ref 96–112)
Creatinine, Ser: 0.89 mg/dL (ref 0.50–1.10)
GFR calc Af Amer: 84 mL/min — ABNORMAL LOW (ref >90)
GFR calc non Af Amer: 73 mL/min — ABNORMAL LOW (ref >90)
Glucose, Bld: 79 mg/dL (ref 70–99)
Potassium: 4.4 mmol/L (ref 3.5–5.1)
Sodium: 138 mmol/L (ref 135–145)
Total Bilirubin: 0.8 mg/dL (ref 0.3–1.2)
Total Protein: 8.3 g/dL (ref 6.0–8.3)

## 2014-06-25 NOTE — Patient Instructions (Addendum)
YOUR PROCEDURE IS SCHEDULED ON : 07/06/14  REPORT TO [facility] MAIN ENTRANCE FOLLOW SIGNS TO SHORT STAY CENTER AT : 5:15 AM  CALL THIS NUMBER IF YOU HAVE PROBLEMS THE MORNING OF SURGERY [phone]  REMEMBER:  DO NOT EAT FOOD OR DRINK LIQUIDS AFTER MIDNIGHT  TAKE THESE MEDICINES THE MORNING OF SURGERY: LEVOTHYROXINE / PROTONIX  YOU MAY NOT HAVE ANY METAL ON YOUR BODY INCLUDING HAIR PINS AND PIERCING'S. DO NOT WEAR JEWELRY, MAKEUP, LOTIONS, POWDERS OR PERFUMES. DO NOT WEAR NAIL POLISH. DO NOT SHAVE 48 HRS PRIOR TO SURGERY. MEN MAY SHAVE FACE AND NECK.  DO NOT [place]. [facility] IS NOT RESPONSIBLE FOR VALUABLES.  CONTACTS, DENTURES OR PARTIALS MAY NOT BE WORN TO SURGERY. LEAVE SUITCASE IN CAR. CAN BE BROUGHT TO ROOM AFTER SURGERY.  PATIENTS DISCHARGED THE DAY OF SURGERY WILL NOT BE ALLOWED TO DRIVE HOME.  PLEASE READ OVER THE FOLLOWING INSTRUCTION SHEETS _________________________________________________________________________________                                           [place] - PREPARING FOR SURGERY  Before surgery, you can play an important role.  Because skin is not sterile, your skin needs to be as free of germs as possible.  You can reduce the number of germs on your skin by washing with CHG (chlorahexidine gluconate) soap before surgery.  CHG is an antiseptic cleaner which kills germs and bonds with the skin to continue killing germs even after washing. Please DO NOT use if you have an allergy to CHG or antibacterial soaps.  If your skin becomes reddened/irritated stop using the CHG and inform your nurse when you arrive at Short Stay. Do not shave (including legs and underarms) for at least 48 hours prior to the first CHG shower.  You may shave your face. Please follow these instructions carefully:   1.  Shower with CHG Soap the night before surgery and the  morning of Surgery.   2.  If you choose to wash your hair, wash  your hair first as usual with your  normal  Shampoo.   3.  After you shampoo, rinse your hair and body thoroughly to remove the  shampoo.                                         4.  Use CHG as you would any other liquid soap.  You can apply chg directly  to the skin and wash . Gently wash with scrungie or clean wascloth    5.  Apply the CHG Soap to your body ONLY FROM THE NECK DOWN.   Do not use on open                           Wound or open sores. Avoid contact with eyes, ears mouth and genitals (private parts).                        Genitals (private parts) with your normal soap.              6.  Wash thoroughly, paying special attention to the area where your surgery  will be performed.  7.  Thoroughly rinse your body with warm water from the neck down.   8.  DO NOT shower/wash with your normal soap after using and rinsing off  the CHG Soap .                9.  Pat yourself dry with a clean towel.             10.  Wear clean night clothes to bed after shower             11.  Place clean sheets on your bed the night of your first shower and do not  sleep with pets.  Day of Surgery : Do not apply any lotions/deodorants the morning of surgery.  Please wear clean clothes to the hospital/surgery center.  FAILURE TO FOLLOW THESE INSTRUCTIONS MAY RESULT IN THE CANCELLATION OF YOUR SURGERY    PATIENT SIGNATURE_________________________________  ______________________________________________________________________

## 2014-07-02 NOTE — Progress Notes (Signed)
Please enter orders in EPIC - surgery is 07/06/14 - thank you

## 2014-07-05 ENCOUNTER — Encounter (HOSPITAL_COMMUNITY): Payer: Self-pay | Admitting: Anesthesiology

## 2014-07-05 ENCOUNTER — Ambulatory Visit: Payer: Self-pay | Admitting: Surgery

## 2014-07-05 NOTE — Progress Notes (Signed)
Called [facility] triage desk requesting orders for 07/06/14 AM surgery.

## 2014-07-05 NOTE — H&P (Signed)
Victoria [name] DOB: 1961/01/26 Divorced / Language: English / Race: Black or African American Female  History of Present Illness  Patient words: Desires bariatric surgery  The patient is a 54 year old female who presents for a bariatric surgery evaluation. Presents for discussion of bariatric surgery options. She has been to one of our seminars. She is followed by Victoria [name] and a letter copies were which documents her weight over the last 30 years. 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 She is decided that she wants to have a Roux-en-Y gastric bypass. I discussed the procedure with her in some detail including our own outcomes and I discussed her mortalities with her. She is aware of the risk in this is an operation she is decided upon. She is struggled with her weight all of her adult life. She denies any history of DVT. She's had bilateral hip replacements. She's had a open cholecystectomy and a previous hysterectomy and salpingo-oophorectomy. She denies any significant snoring or symptoms suggestive of obstructive sleep apnea. She does have reflux for which she takes a PPI. She wants to proceed along the pathway toward Roux-en-Y gastric bypass. She did mention that she had had previous history of C. difficile infection.  Ultrasound and UGI were negative.     Other Problems (Victoria Eversole, LPN; 2/63/7858 85:02 AM) Arthritis Back Pain Chest pain Gastroesophageal Reflux Disease Migraine Headache Thyroid Disease Transfusion history  Past Surgical History (Victoria Eversole, LPN; 7/74/1287 86:76 AM) Cesarean Section - 1 Colon Polyp Removal - Colonoscopy Foot Surgery Bilateral. Gallbladder Surgery - Open Hip Surgery Bilateral. Hysterectomy (not due to cancer) - Partial Oral Surgery Thyroid  Surgery  Diagnostic Studies History (Victoria Eversole, LPN; 09/14/9468 96:28 AM) Colonoscopy 5-10 years ago Mammogram 1-3 years ago Pap Smear 1-5 years ago  Allergies (Victoria Eversole, LPN; 3/66/2947 65:46 AM) No Known Drug Allergies02/12/2014  Medication History (Victoria Eversole, LPN; 06/29/5463 68:12 AM) ValACYclovir HCl (500MG  Tablet, Oral) Active. Carafate (1GM/10ML Suspension, Oral) Active. Levothyroxine Sodium (150MCG Tablet, Oral) Active. Hydrocodone-Acetaminophen (10-325MG  Tablet, Oral) Active. Estradiol (1MG  Tablet, Oral) Active. Cyclobenzaprine HCl (10MG  Tablet, Oral) Active. Cyanocobalamin (1000MCG/ML Solution, Injection) Active. Aspirin Childrens (81MG  Tablet Chewable, Oral) Active. Cholecalciferol (2000UNIT Tablet Chewable, Oral) Active. Protonix (40MG  Packet, Oral) Active. Phenergan (25MG  Tablet, Oral) Active. Imitrex (100MG  Tablet, Oral) Active.  Social History (Victoria Eversole, LPN; 7/51/7001 74:94 AM) Alcohol use Remotely quit alcohol use. Caffeine use Carbonated beverages. No drug use Tobacco use Never smoker.  Family History Victoria Borer, LPN; 4/96/7591 63:84 AM) Cerebrovascular Accident Victoria [name]. Diabetes Mellitus Family Members In [name], Victoria [name], Victoria [name]. Heart Disease Victoria [name]. Hypertension Victoria [name], Mother, Victoria [name]. Seizure disorder Victoria [name]. Thyroid problems Mother.  Pregnancy / Birth History Victoria Borer, LPN; 6/65/9935 70:17 AM) Age at menarche 50 years. Gravida 2 Maternal age 1-20 Para 1  Review of Systems (Victoria Eversole LPN; 7/93/9030 09:23 AM) [name] Present- Fatigue, Night Sweats and Weight Gain. Not Present- Appetite Loss, Chills, Fever and Weight Loss. Skin Not Present- Change in Wart/Mole, Dryness, Hives, Jaundice, New Lesions, Non-Healing Wounds, Rash and Ulcer. HEENT Present- Wears glasses/contact lenses. Not Present- Earache, Hearing Loss, Hoarseness, Nose Bleed, Oral Ulcers, Ringing in the Ears, Seasonal  Allergies, Sinus Pain, Sore Throat, Visual Disturbances and Yellow Eyes. Respiratory Present- Snoring. Not Present- Bloody sputum, Chronic Cough, Difficulty Breathing and Wheezing. Cardiovascular Not Present- Chest Pain, Difficulty  Breathing Lying Down, Leg Cramps, Palpitations, Rapid Heart Rate, Shortness of Breath and Swelling of Extremities. Gastrointestinal Present- Nausea. Not Present- Abdominal Pain, Bloating, Bloody Stool, Change in Bowel Habits, Chronic diarrhea, Constipation, Difficulty Swallowing, Excessive gas, Gets full quickly at meals, Hemorrhoids, Indigestion, Rectal Pain and Vomiting. Female Genitourinary Not Present- Frequency, Nocturia, Painful Urination, Pelvic Pain and Urgency. Musculoskeletal Present- Back Pain, Joint Pain and Joint Stiffness. Not Present- Muscle Pain, Muscle Weakness and Swelling of Extremities. Neurological Present- Headaches. Not Present- Decreased Memory, Fainting, Numbness, Seizures, Tingling, Tremor, Trouble walking and Weakness. Psychiatric Not Present- Anxiety, Bipolar, Change in Sleep Pattern, Depression, Fearful and Frequent crying. Endocrine Present- Hot flashes. Not Present- Cold Intolerance, Excessive Hunger, Hair Changes, Heat Intolerance and New Diabetes. Hematology Present- Gland problems. Not Present- Easy Bruising, Excessive bleeding, HIV and Persistent Infections.   Vitals (Victoria Eversole LPN; 1/74/9449 67:59 AM) 04/08/2014 10:40 AM Weight: 287.2 lb Height: 66.5in Body Surface Area: 2.47 m Body Mass Index: 45.66 kg/m Temp.: 97.92F(Oral)  Pulse: 82 (Regular)  BP: 140/82 (Sitting, Left Arm, Standard)    Physical Exam (Victoria B. Hassell Done MD; 04/08/2014 11:31 AM) [name] Note: HEENT-unremarkable Neck -no bruits; scar from thyroidectomy for Graves disease Chest clear Heart sR without murmurs Abdomen nontender Ext FrOM no hx of DVT Neuro-alert and oriented x 3, motor and sensory are intact     Assessment & Plan Victoria [name]  B. Hassell Done MD; 04/08/2014 11:33 AM) Victoria [name] Victoria [name] (278.01  E66.01) Ultrasound and UGI are negative Victoria [name] 04/08/2014 10:39 AM Location: [facility] Patient #: [id number] DOB: 1960-08-29 Divorced / Language: English / Race: Black or African American Female  History of Present Illness Victoria [name] B. Hassell Done MD; 04/08/2014 11:30 AM) Patient words: new bari.  The patient is a 54 year old female who presents for a bariatric surgery evaluation. Presents for discussion of bariatric surgery options. She has been to one of our seminars. She is followed by Victoria [name] and a letter copies were which documents her weight over the last 30 years. 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 She is decided that she wants to have a Roux-en-Y gastric bypass. I discussed the procedure with her in some detail including our own L Combs and I discussed her mortalities with her. She is aware of the risk in this is an operation she is decided upon. She is struggled with her weight all of her adult life. She denies any history of DVT. She's had bilateral hip replacements. She's had a open cholecystectomy and a previous hysterectomy and salpingo-oophorectomy. She denies any significant snoring or symptoms suggestive of obstructive sleep apnea. She does have reflux for which she takes a PPI. She wants to proceed along the pathway toward Roux-en-Y gastric bypass. She did mention that she had had previous history of C. difficile infection.   Other Problems (Victoria Eversole, LPN; 6/59/9357 01:77 AM) Arthritis Back Pain Chest pain Gastroesophageal Reflux Disease Migraine Headache Thyroid Disease Transfusion history  Past Surgical History (Victoria Eversole, LPN; 9/39/0300 92:33 AM) Cesarean Section - 1 Colon Polyp Removal -  Colonoscopy Foot Surgery Bilateral. Gallbladder Surgery - Open Hip Surgery Bilateral. Hysterectomy (not due to cancer) - Partial Oral Surgery Thyroid Surgery  Diagnostic Studies History (Victoria Eversole, LPN; 0/08/6224 33:35 AM) Colonoscopy 5-10 years ago Mammogram 1-3 years ago Pap Smear 1-5 years ago  Allergies (Victoria Eversole, LPN; 5/39/7673 41:93 AM) No Known Drug Allergies02/12/2014  Medication History (Victoria Eversole, LPN; 7/90/2409 73:53 AM) ValACYclovir HCl (500MG  Tablet, Oral) Active. Carafate (1GM/10ML Suspension, Oral) Active. Levothyroxine Sodium (150MCG Tablet, Oral) Active. Hydrocodone-Acetaminophen (10-325MG  Tablet, Oral) Active. Estradiol (1MG  Tablet, Oral) Active. Cyclobenzaprine HCl (10MG  Tablet, Oral) Active. Cyanocobalamin (1000MCG/ML Solution, Injection) Active. Aspirin Childrens (81MG  Tablet Chewable, Oral) Active. Cholecalciferol (2000UNIT Tablet Chewable, Oral) Active. Protonix (40MG  Packet, Oral) Active. Phenergan (25MG  Tablet, Oral) Active. Imitrex (100MG  Tablet, Oral) Active.  Social History (Victoria Eversole, LPN; 2/99/2426 83:41 AM) Alcohol use Remotely quit alcohol use. Caffeine use Carbonated beverages. No drug use Tobacco use Never smoker.  Family History Victoria Borer, LPN; 9/62/2297 98:92 AM) Cerebrovascular Accident Victoria [name]. Diabetes Mellitus Family Members In [name], Victoria [name], Victoria [name]. Heart Disease Victoria [name]. Hypertension Victoria [name], Mother, Victoria [name]. Seizure disorder Victoria [name]. Thyroid problems Mother.  Pregnancy / Birth History Victoria Borer, LPN; 03/16/4172 08:14 AM) Age at menarche 73 years. Gravida 2 Maternal age 23-20 Para 1  Review of Systems (Victoria Eversole LPN; 4/81/8563 14:97 AM) [name] Present- Fatigue, Night Sweats and Weight Gain. Not Present- Appetite Loss, Chills, Fever and Weight Loss. Skin Not Present- Change in Wart/Mole, Dryness, Hives, Jaundice, New Lesions, Non-Healing Wounds, Rash  and Ulcer. HEENT Present- Wears glasses/contact lenses. Not Present- Earache, Hearing Loss, Hoarseness, Nose Bleed, Oral Ulcers, Ringing in the Ears, Seasonal Allergies, Sinus Pain, Sore Throat, Visual Disturbances and Yellow Eyes. Respiratory Present- Snoring. Not Present- Bloody sputum, Chronic Cough, Difficulty Breathing and Wheezing. Cardiovascular Not Present- Chest Pain, Difficulty Breathing Lying Down, Leg Cramps, Palpitations, Rapid Heart Rate, Shortness of Breath and Swelling of Extremities. Gastrointestinal Present- Nausea. Not Present- Abdominal Pain, Bloating, Bloody Stool, Change in Bowel Habits, Chronic diarrhea, Constipation, Difficulty Swallowing, Excessive gas, Gets full quickly at meals, Hemorrhoids, Indigestion, Rectal Pain and Vomiting. Female Genitourinary Not Present- Frequency, Nocturia, Painful Urination, Pelvic Pain and Urgency. Musculoskeletal Present- Back Pain, Joint Pain and Joint Stiffness. Not Present- Muscle Pain, Muscle Weakness and Swelling of Extremities. Neurological Present- Headaches. Not Present- Decreased Memory, Fainting, Numbness, Seizures, Tingling, Tremor, Trouble walking and Weakness. Psychiatric Not Present- Anxiety, Bipolar, Change in Sleep Pattern, Depression, Fearful and Frequent crying. Endocrine Present- Hot flashes. Not Present- Cold Intolerance, Excessive Hunger, Hair Changes, Heat Intolerance and New Diabetes. Hematology Present- Gland problems. Not Present- Easy Bruising, Excessive bleeding, HIV and Persistent Infections.   Vitals (Victoria Eversole LPN; 0/26/3785 88:50 AM) 04/08/2014 10:40 AM Weight: 287.2 lb Height: 66.5in Body Surface Area: 2.47 m Body Mass Index: 45.66 kg/m Temp.: 97.62F(Oral)  Pulse: 82 (Regular)  BP: 140/82 (Sitting, Left Arm, Standard)    Physical Exam (Victoria B. Hassell Done MD; 04/08/2014 11:31 AM) [name] Note: HEENT-unremarkable Neck -no bruits; scar from thyroidectomy for Graves disease Chest  clear Heart sR without murmurs Abdomen nontender Ext FrOM no hx of DVT Neuro-alert and oriented x 3, motor and sensory are intact     Assessment & Plan Victoria [name] B. Hassell Done MD; 04/08/2014 11:33 AM) Victoria [name] Victoria [name] (278.01  E66.01) Impression: UGI and ultrasound negative.  For lap roux en y on May 10

## 2014-07-05 NOTE — Anesthesia Preprocedure Evaluation (Signed)
Anesthesia Evaluation  Patient identified by MRN, date of birth, ID band Patient awake    Reviewed: Allergy & Precautions, NPO status , Patient's Chart, lab work & pertinent test results  History of Anesthesia Complications (+) PONV and history of anesthetic complications  Airway Mallampati: II  TM Distance: >3 FB Neck ROM: Full    Dental no notable dental hx.    Pulmonary neg pulmonary ROS,  breath sounds clear to auscultation  Pulmonary exam normal       Cardiovascular negative cardio ROS Normal cardiovascular examRhythm:Regular Rate:Normal     Neuro/Psych  Headaches,  Neuromuscular disease negative psych ROS   GI/Hepatic Neg liver ROS, hiatal hernia, GERD-  Medicated,  Endo/Other  Hypothyroidism Morbid obesity  Renal/GU negative Renal ROS  negative genitourinary   Musculoskeletal negative musculoskeletal ROS (+) Arthritis -,   Abdominal (+) + obese,   Peds negative pediatric ROS (+)  Hematology negative hematology ROS (+) anemia ,   Anesthesia Other Findings   Reproductive/Obstetrics negative OB ROS                             Anesthesia Physical Anesthesia Plan  ASA: III  Anesthesia Plan: General   Post-op Pain Management:    Induction: Intravenous  Airway Management Planned: Oral ETT  Additional Equipment:   Intra-op Plan:   Post-operative Plan: Extubation in OR  Informed Consent: I have reviewed the patients History and Physical, chart, labs and discussed the procedure including the risks, benefits and alternatives for the proposed anesthesia with the patient or authorized representative who has indicated his/her understanding and acceptance.   Dental advisory given  Plan Discussed with: CRNA  Anesthesia Plan Comments: (H/O difficulty swallowing solid food after previous intubation.)        Anesthesia Quick Evaluation

## 2014-07-06 ENCOUNTER — Inpatient Hospital Stay (HOSPITAL_COMMUNITY)
Admission: RE | Admit: 2014-07-06 | Discharge: 2014-07-08 | DRG: 621 | Disposition: A | Discharge status: Home / Self Care | Payer: 59 | Source: Ambulatory Visit | Attending: Surgery | Admitting: Surgery

## 2014-07-06 ENCOUNTER — Encounter (HOSPITAL_COMMUNITY): Payer: Self-pay

## 2014-07-06 ENCOUNTER — Inpatient Hospital Stay (HOSPITAL_COMMUNITY): Payer: 59 | Admitting: Anesthesiology

## 2014-07-06 ENCOUNTER — Encounter (HOSPITAL_COMMUNITY)
Admission: RE | Disposition: A | Discharge status: Home / Self Care | Payer: Self-pay | Source: Ambulatory Visit | Attending: Surgery

## 2014-07-06 DIAGNOSIS — Z79899 Other long term (current) drug therapy: Secondary | ICD-10-CM | POA: Diagnosis not present

## 2014-07-06 DIAGNOSIS — Z7982 Long term (current) use of aspirin: Secondary | ICD-10-CM | POA: Diagnosis not present

## 2014-07-06 DIAGNOSIS — Z8249 Family history of ischemic heart disease and other diseases of the circulatory system: Secondary | ICD-10-CM | POA: Diagnosis not present

## 2014-07-06 DIAGNOSIS — Z820 Family history of epilepsy and other diseases of the nervous system: Secondary | ICD-10-CM | POA: Diagnosis not present

## 2014-07-06 DIAGNOSIS — K449 Diaphragmatic hernia without obstruction or gangrene: Secondary | ICD-10-CM | POA: Diagnosis present

## 2014-07-06 DIAGNOSIS — Z833 Family history of diabetes mellitus: Secondary | ICD-10-CM

## 2014-07-06 DIAGNOSIS — M1990 Unspecified osteoarthritis, unspecified site: Secondary | ICD-10-CM | POA: Diagnosis present

## 2014-07-06 DIAGNOSIS — Z86010 Personal history of colonic polyps: Secondary | ICD-10-CM | POA: Diagnosis not present

## 2014-07-06 DIAGNOSIS — E079 Disorder of thyroid, unspecified: Secondary | ICD-10-CM | POA: Diagnosis present

## 2014-07-06 DIAGNOSIS — K219 Gastro-esophageal reflux disease without esophagitis: Secondary | ICD-10-CM | POA: Diagnosis present

## 2014-07-06 DIAGNOSIS — Z01812 Encounter for preprocedural laboratory examination: Secondary | ICD-10-CM | POA: Diagnosis not present

## 2014-07-06 DIAGNOSIS — K660 Peritoneal adhesions (postprocedural) (postinfection): Secondary | ICD-10-CM | POA: Diagnosis present

## 2014-07-06 DIAGNOSIS — Z823 Family history of stroke: Secondary | ICD-10-CM

## 2014-07-06 DIAGNOSIS — Z6841 Body Mass Index (BMI) 40.0 and over, adult: Secondary | ICD-10-CM | POA: Diagnosis not present

## 2014-07-06 DIAGNOSIS — Z90710 Acquired absence of both cervix and uterus: Secondary | ICD-10-CM

## 2014-07-06 DIAGNOSIS — Z96643 Presence of artificial hip joint, bilateral: Secondary | ICD-10-CM | POA: Diagnosis present

## 2014-07-06 DIAGNOSIS — Z9884 Bariatric surgery status: Secondary | ICD-10-CM

## 2014-07-06 HISTORY — PX: HIATAL HERNIA REPAIR: SHX195

## 2014-07-06 HISTORY — PX: GASTRIC ROUX-EN-Y: SHX5262

## 2014-07-06 LAB — CBC
HCT: 36.3 % (ref 36.0–46.0)
Hemoglobin: 11.9 g/dL — ABNORMAL LOW (ref 12.0–15.0)
MCH: 30 pg (ref 26.0–34.0)
MCHC: 32.8 g/dL (ref 30.0–36.0)
MCV: 91.4 fL (ref 78.0–100.0)
Platelets: 200 10*3/uL (ref 150–400)
RBC: 3.97 MIL/uL (ref 3.87–5.11)
RDW: 14.4 % (ref 11.5–15.5)
WBC: 11 10*3/uL — ABNORMAL HIGH (ref 4.0–10.5)

## 2014-07-06 LAB — CREATININE, SERUM
Creatinine, Ser: 0.93 mg/dL (ref 0.44–1.00)
GFR calc Af Amer: >60 mL/min (ref >60)
GFR calc non Af Amer: >60 mL/min (ref >60)

## 2014-07-06 LAB — HEMOGLOBIN AND HEMATOCRIT, BLOOD
HCT: 38 % (ref 36.0–46.0)
Hemoglobin: 12.7 g/dL (ref 12.0–15.0)

## 2014-07-06 SURGERY — LAPAROSCOPIC ROUX-EN-Y GASTRIC BYPASS WITH UPPER ENDOSCOPY
Anesthesia: General | Site: Abdomen

## 2014-07-06 MED ORDER — LACTATED RINGERS IR SOLN
Status: DC | PRN
  Administered 2014-07-06: 1000 mL

## 2014-07-06 MED ORDER — LABETALOL HCL 5 MG/ML IV SOLN
INTRAVENOUS | Status: AC
  Filled 2014-07-06: qty 4

## 2014-07-06 MED ORDER — LACTATED RINGERS IV SOLN
INTRAVENOUS | Status: DC | PRN
  Administered 2014-07-06 (×2): via INTRAVENOUS

## 2014-07-06 MED ORDER — LIDOCAINE HCL (CARDIAC) 20 MG/ML IV SOLN
INTRAVENOUS | Status: AC
  Filled 2014-07-06: qty 5

## 2014-07-06 MED ORDER — ACETAMINOPHEN 160 MG/5ML PO SOLN: 325.0000 mg | ORAL | Status: DC | PRN

## 2014-07-06 MED ORDER — MIDAZOLAM HCL 2 MG/2ML IJ SOLN
INTRAMUSCULAR | Status: AC
  Filled 2014-07-06: qty 2

## 2014-07-06 MED ORDER — BUPIVACAINE LIPOSOME 1.3 % IJ SUSP
20.0000 mL | Freq: Once | INTRAMUSCULAR | Status: AC
  Administered 2014-07-06: 20 mL
  Filled 2014-07-06: qty 20

## 2014-07-06 MED ORDER — SODIUM CHLORIDE 0.9 % IJ SOLN
INTRAMUSCULAR | Status: AC
  Filled 2014-07-06: qty 10

## 2014-07-06 MED ORDER — HEPARIN SODIUM (PORCINE) 5000 UNIT/ML IJ SOLN
5000.0000 [IU] | INTRAMUSCULAR | Status: AC
  Administered 2014-07-06: 5000 [IU] via SUBCUTANEOUS
  Filled 2014-07-06: qty 1

## 2014-07-06 MED ORDER — ONDANSETRON HCL 4 MG/2ML IJ SOLN: 4.0000 mg | INTRAMUSCULAR | Status: DC | PRN

## 2014-07-06 MED ORDER — HYDROMORPHONE HCL 1 MG/ML IJ SOLN
INTRAMUSCULAR | Status: DC | PRN
Start: 2014-07-06 — End: 2014-07-06
  Administered 2014-07-06 (×5): .4 mg via INTRAVENOUS

## 2014-07-06 MED ORDER — ONDANSETRON HCL 4 MG/2ML IJ SOLN
INTRAMUSCULAR | Status: AC
  Filled 2014-07-06: qty 2

## 2014-07-06 MED ORDER — UNJURY CHOCOLATE CLASSIC POWDER
2.0000 [oz_av] | Freq: Four times a day (QID) | ORAL | Status: DC
  Administered 2014-07-08: 2 [oz_av] via ORAL

## 2014-07-06 MED ORDER — DEXTROSE 5 % IV SOLN
INTRAVENOUS | Status: AC
  Filled 2014-07-06: qty 2

## 2014-07-06 MED ORDER — PROPOFOL 10 MG/ML IV BOLUS
INTRAVENOUS | Status: AC
  Filled 2014-07-06: qty 20

## 2014-07-06 MED ORDER — CEFOXITIN SODIUM 2 G IV SOLR
INTRAVENOUS | Status: AC
  Filled 2014-07-06: qty 2

## 2014-07-06 MED ORDER — ROCURONIUM BROMIDE 100 MG/10ML IV SOLN
INTRAVENOUS | Status: DC | PRN
  Administered 2014-07-06: 20 mg via INTRAVENOUS
  Administered 2014-07-06 (×3): 10 mg via INTRAVENOUS
  Administered 2014-07-06: 40 mg via INTRAVENOUS
  Administered 2014-07-06 (×2): 10 mg via INTRAVENOUS

## 2014-07-06 MED ORDER — MIDAZOLAM HCL 5 MG/5ML IJ SOLN
INTRAMUSCULAR | Status: DC | PRN
  Administered 2014-07-06: 2 mg via INTRAVENOUS

## 2014-07-06 MED ORDER — PANTOPRAZOLE SODIUM 40 MG IV SOLR
40.0000 mg | Freq: Every day | INTRAVENOUS | Status: DC
  Administered 2014-07-06 – 2014-07-07 (×2): 40 mg via INTRAVENOUS
  Filled 2014-07-06 (×3): qty 40

## 2014-07-06 MED ORDER — FENTANYL CITRATE (PF) 100 MCG/2ML IJ SOLN
INTRAMUSCULAR | Status: DC | PRN
  Administered 2014-07-06: 50 ug via INTRAVENOUS
  Administered 2014-07-06: 100 ug via INTRAVENOUS
  Administered 2014-07-06 (×2): 50 ug via INTRAVENOUS

## 2014-07-06 MED ORDER — DEXTROSE 5 % IV SOLN
2.0000 g | INTRAVENOUS | Status: AC
  Administered 2014-07-06 (×2): 2 g via INTRAVENOUS

## 2014-07-06 MED ORDER — DEXAMETHASONE SODIUM PHOSPHATE 10 MG/ML IJ SOLN
INTRAMUSCULAR | Status: DC | PRN
  Administered 2014-07-06: 10 mg via INTRAVENOUS

## 2014-07-06 MED ORDER — LIDOCAINE HCL (CARDIAC) 20 MG/ML IV SOLN
INTRAVENOUS | Status: DC | PRN
  Administered 2014-07-06: 100 mg via INTRAVENOUS

## 2014-07-06 MED ORDER — NEOSTIGMINE METHYLSULFATE 10 MG/10ML IV SOLN
INTRAVENOUS | Status: AC
  Filled 2014-07-06: qty 1

## 2014-07-06 MED ORDER — SCOPOLAMINE 1 MG/3DAYS TD PT72
MEDICATED_PATCH | TRANSDERMAL | Status: AC
  Filled 2014-07-06: qty 1

## 2014-07-06 MED ORDER — ACETAMINOPHEN 160 MG/5ML PO SOLN
650.0000 mg | ORAL | Status: DC | PRN
Start: 2014-07-07 — End: 2014-07-08

## 2014-07-06 MED ORDER — MORPHINE SULFATE 2 MG/ML IJ SOLN
2.0000 mg | INTRAMUSCULAR | Status: DC | PRN
  Administered 2014-07-06: 2 mg via INTRAVENOUS
  Administered 2014-07-06: 4 mg via INTRAVENOUS
  Administered 2014-07-06: 2 mg via INTRAVENOUS
  Administered 2014-07-06 – 2014-07-07 (×5): 4 mg via INTRAVENOUS
  Filled 2014-07-06 (×2): qty 2
  Filled 2014-07-06: qty 1
  Filled 2014-07-06 (×3): qty 2
  Filled 2014-07-06: qty 1
  Filled 2014-07-06: qty 2

## 2014-07-06 MED ORDER — PROPOFOL 10 MG/ML IV BOLUS
INTRAVENOUS | Status: DC | PRN
  Administered 2014-07-06: 150 mg via INTRAVENOUS

## 2014-07-06 MED ORDER — HEPARIN SODIUM (PORCINE) 5000 UNIT/ML IJ SOLN
5000.0000 [IU] | Freq: Three times a day (TID) | INTRAMUSCULAR | Status: DC
  Administered 2014-07-06 – 2014-07-08 (×5): 5000 [IU] via SUBCUTANEOUS
  Filled 2014-07-06 (×8): qty 1

## 2014-07-06 MED ORDER — LABETALOL HCL 5 MG/ML IV SOLN
5.0000 mg | INTRAVENOUS | Status: DC | PRN
  Administered 2014-07-06: 5 mg via INTRAVENOUS

## 2014-07-06 MED ORDER — OXYCODONE HCL 5 MG/5ML PO SOLN
5.0000 mg | ORAL | Status: DC | PRN
  Administered 2014-07-07: 10 mg via ORAL
  Administered 2014-07-07: 5 mg via ORAL
  Administered 2014-07-07: 10 mg via ORAL
  Filled 2014-07-06: qty 10
  Filled 2014-07-06: qty 5
  Filled 2014-07-06: qty 10

## 2014-07-06 MED ORDER — GLYCOPYRROLATE 0.2 MG/ML IJ SOLN
INTRAMUSCULAR | Status: DC | PRN
  Administered 2014-07-06: .8 mg via INTRAVENOUS

## 2014-07-06 MED ORDER — DEXAMETHASONE SODIUM PHOSPHATE 10 MG/ML IJ SOLN
INTRAMUSCULAR | Status: AC
  Filled 2014-07-06: qty 1

## 2014-07-06 MED ORDER — HYDROMORPHONE HCL 1 MG/ML IJ SOLN
0.2500 mg | INTRAMUSCULAR | Status: DC | PRN
  Administered 2014-07-06 (×3): 0.5 mg via INTRAVENOUS

## 2014-07-06 MED ORDER — FENTANYL CITRATE (PF) 100 MCG/2ML IJ SOLN
25.0000 ug | INTRAMUSCULAR | Status: DC | PRN
  Administered 2014-07-06 (×2): 25 ug via INTRAVENOUS

## 2014-07-06 MED ORDER — KCL IN DEXTROSE-NACL 20-5-0.45 MEQ/L-%-% IV SOLN
INTRAVENOUS | Status: DC
  Administered 2014-07-06: 15:00:00 via INTRAVENOUS
  Administered 2014-07-06: 1000 mL via INTRAVENOUS
  Administered 2014-07-07 – 2014-07-08 (×3): via INTRAVENOUS
  Filled 2014-07-06 (×6): qty 1000

## 2014-07-06 MED ORDER — UNJURY VANILLA POWDER: 2.0000 [oz_av] | Freq: Four times a day (QID) | ORAL | Status: DC

## 2014-07-06 MED ORDER — GLYCOPYRROLATE 0.2 MG/ML IJ SOLN
INTRAMUSCULAR | Status: AC
  Filled 2014-07-06: qty 2

## 2014-07-06 MED ORDER — TISSEEL VH 10 ML EX KIT
PACK | CUTANEOUS | Status: AC
  Filled 2014-07-06: qty 1

## 2014-07-06 MED ORDER — ROCURONIUM BROMIDE 100 MG/10ML IV SOLN
INTRAVENOUS | Status: AC
  Filled 2014-07-06: qty 1

## 2014-07-06 MED ORDER — CHLORHEXIDINE GLUCONATE CLOTH 2 % EX PADS: 6.0000 | MEDICATED_PAD | Freq: Once | CUTANEOUS | Status: DC

## 2014-07-06 MED ORDER — UNJURY CHICKEN SOUP POWDER: 2.0000 [oz_av] | Freq: Four times a day (QID) | ORAL | Status: DC

## 2014-07-06 MED ORDER — 0.9 % SODIUM CHLORIDE (POUR BTL) OPTIME
TOPICAL | Status: DC | PRN
  Administered 2014-07-06: 1000 mL

## 2014-07-06 MED ORDER — SODIUM CHLORIDE 0.9 % IJ SOLN
INTRAMUSCULAR | Status: DC | PRN
  Administered 2014-07-06: 10 mL

## 2014-07-06 MED ORDER — HYDROMORPHONE HCL 1 MG/ML IJ SOLN
INTRAMUSCULAR | Status: AC
  Filled 2014-07-06: qty 1

## 2014-07-06 MED ORDER — EPHEDRINE SULFATE 50 MG/ML IJ SOLN
INTRAMUSCULAR | Status: AC
  Filled 2014-07-06: qty 1

## 2014-07-06 MED ORDER — TISSEEL VH 10 ML EX KIT
PACK | CUTANEOUS | Status: DC | PRN
  Administered 2014-07-06: 1

## 2014-07-06 MED ORDER — SUCCINYLCHOLINE CHLORIDE 20 MG/ML IJ SOLN
INTRAMUSCULAR | Status: DC | PRN
  Administered 2014-07-06: 140 mg via INTRAVENOUS

## 2014-07-06 MED ORDER — FENTANYL CITRATE (PF) 250 MCG/5ML IJ SOLN
INTRAMUSCULAR | Status: AC
  Filled 2014-07-06: qty 5

## 2014-07-06 MED ORDER — ONDANSETRON HCL 4 MG/2ML IJ SOLN
INTRAMUSCULAR | Status: DC | PRN
  Administered 2014-07-06: 4 mg via INTRAVENOUS

## 2014-07-06 MED ORDER — SCOPOLAMINE 1 MG/3DAYS TD PT72
MEDICATED_PATCH | TRANSDERMAL | Status: DC | PRN
  Administered 2014-07-06: 1 via TRANSDERMAL

## 2014-07-06 MED ORDER — FENTANYL CITRATE (PF) 100 MCG/2ML IJ SOLN
INTRAMUSCULAR | Status: AC
  Filled 2014-07-06: qty 2

## 2014-07-06 MED ORDER — NEOSTIGMINE METHYLSULFATE 10 MG/10ML IV SOLN
INTRAVENOUS | Status: DC | PRN
  Administered 2014-07-06: 5 mg via INTRAVENOUS

## 2014-07-06 MED ORDER — HYDROMORPHONE HCL 2 MG/ML IJ SOLN
INTRAMUSCULAR | Status: AC
  Filled 2014-07-06: qty 1

## 2014-07-06 SURGICAL SUPPLY — 66 items
APL SKNCLS STERI-STRIP NONHPOA (GAUZE/BANDAGES/DRESSINGS)
APL SRG 32X5 SNPLK LF DISP (MISCELLANEOUS) ×1
APPLICATOR COTTON TIP 6IN STRL (MISCELLANEOUS) ×2 IMPLANT
BENZOIN TINCTURE PRP APPL 2/3 (GAUZE/BANDAGES/DRESSINGS) IMPLANT
BLADE SURG 15 STRL LF DISP TIS (BLADE) ×1 IMPLANT
BLADE SURG 15 STRL SS (BLADE) ×2
CABLE HIGH FREQUENCY MONO STRZ (ELECTRODE) ×1 IMPLANT
CLIP SUT LAPRA TY ABSORB (SUTURE) ×3 IMPLANT
DEVICE SUT QUICK LOAD TK 5 (STAPLE) ×2 IMPLANT
DEVICE SUT TI-KNOT TK 5X26 (MISCELLANEOUS) ×1 IMPLANT
DEVICE SUTURE ENDOST 10MM (ENDOMECHANICALS) ×2 IMPLANT
DISSECTOR BLUNT TIP ENDO 5MM (MISCELLANEOUS) ×1 IMPLANT
DRAIN PENROSE 18X1/4 LTX STRL (WOUND CARE) ×2 IMPLANT
DRAPE CAMERA CLOSED 9X96 (DRAPES) ×2 IMPLANT
GAUZE SPONGE 4X4 12PLY STRL (GAUZE/BANDAGES/DRESSINGS) ×1 IMPLANT
GAUZE SPONGE 4X4 16PLY XRAY LF (GAUZE/BANDAGES/DRESSINGS) ×2 IMPLANT
GLOVE BIOGEL M 8.0 STRL (GLOVE) ×2 IMPLANT
GOWN STRL REUS W/TWL XL LVL3 (GOWN DISPOSABLE) ×8 IMPLANT
HANDLE STAPLE EGIA 4 XL (STAPLE) ×2 IMPLANT
HOVERMATT SINGLE USE (MISCELLANEOUS) ×2 IMPLANT
KIT BASIN OR (CUSTOM PROCEDURE TRAY) ×2 IMPLANT
KIT GASTRIC LAVAGE 34FR ADT (SET/KITS/TRAYS/PACK) ×2 IMPLANT
NDL SPNL 22GX3.5 QUINCKE BK (NEEDLE) ×1 IMPLANT
NEEDLE SPNL 22GX3.5 QUINCKE BK (NEEDLE) ×2 IMPLANT
PACK CARDIOVASCULAR III (CUSTOM PROCEDURE TRAY) ×2 IMPLANT
PEN SKIN MARKING BROAD (MISCELLANEOUS) ×2 IMPLANT
RELOAD EGIA 45 MED/THCK PURPLE (STAPLE) ×4 IMPLANT
RELOAD EGIA 45 TAN VASC (STAPLE) ×4 IMPLANT
RELOAD EGIA 60 MED/THCK PURPLE (STAPLE) ×4 IMPLANT
RELOAD EGIA 60 TAN VASC (STAPLE) ×2 IMPLANT
RELOAD ENDO STITCH 2.0 (ENDOMECHANICALS) ×18
RELOAD STAPLE 45 PURP MED/THCK (STAPLE) IMPLANT
RELOAD STAPLE 60 MED/THCK ART (STAPLE) ×2 IMPLANT
RELOAD SUT SNGL STCH ABSRB 2-0 (ENDOMECHANICALS) ×5 IMPLANT
RELOAD SUT SNGL STCH BLK 2-0 (ENDOMECHANICALS) ×4 IMPLANT
RELOAD TRI 45 ART MED THCK PUR (STAPLE) IMPLANT
RELOAD TRI 60 ART MED THCK PUR (STAPLE) ×2 IMPLANT
SCISSORS LAP 5X45 EPIX DISP (ENDOMECHANICALS) ×2 IMPLANT
SEALANT SURGICAL APPL DUAL CAN (MISCELLANEOUS) ×2 IMPLANT
SET IRRIG TUBING LAPAROSCOPIC (IRRIGATION / IRRIGATOR) ×2 IMPLANT
SHEARS CURVED HARMONIC AC 45CM (MISCELLANEOUS) ×2 IMPLANT
SLEEVE ADV FIXATION 12X100MM (TROCAR) ×5 IMPLANT
SLEEVE ADV FIXATION 5X100MM (TROCAR) ×2 IMPLANT
SOLUTION ANTI FOG 6CC (MISCELLANEOUS) ×2 IMPLANT
STAPLER VISISTAT 35W (STAPLE) ×2 IMPLANT
STRIP CLOSURE SKIN 1/2X4 (GAUZE/BANDAGES/DRESSINGS) IMPLANT
SUT RELOAD ENDO STITCH 2 48X1 (ENDOMECHANICALS) ×5
SUT RELOAD ENDO STITCH 2.0 (ENDOMECHANICALS) ×4
SUT SURGIDAC NAB ES-9 0 48 120 (SUTURE) ×2 IMPLANT
SUT VIC AB 2-0 SH 27 (SUTURE) ×2
SUT VIC AB 2-0 SH 27X BRD (SUTURE) ×1 IMPLANT
SUT VIC AB 4-0 SH 18 (SUTURE) ×2 IMPLANT
SUTURE RELOAD END STTCH 2 48X1 (ENDOMECHANICALS) ×5 IMPLANT
SUTURE RELOAD ENDO STITCH 2.0 (ENDOMECHANICALS) ×4 IMPLANT
SYR 20CC LL (SYRINGE) ×4 IMPLANT
SYR 50ML LL SCALE MARK (SYRINGE) ×2 IMPLANT
TOWEL OR 17X26 10 PK STRL BLUE (TOWEL DISPOSABLE) ×4 IMPLANT
TOWEL OR NON WOVEN STRL DISP B (DISPOSABLE) ×2 IMPLANT
TRAY FOLEY W/METER SILVER 14FR (SET/KITS/TRAYS/PACK) ×2 IMPLANT
TROCAR ADV FIXATION 12X100MM (TROCAR) ×2 IMPLANT
TROCAR ADV FIXATION 5X100MM (TROCAR) ×2 IMPLANT
TROCAR BLADELESS OPT 5 100 (ENDOMECHANICALS) ×2 IMPLANT
TROCAR XCEL 12X100 BLDLESS (ENDOMECHANICALS) ×2 IMPLANT
TUBING CONNECTING 10 (TUBING) ×2 IMPLANT
TUBING ENDO SMARTCAP PENTAX (MISCELLANEOUS) ×2 IMPLANT
TUBING FILTER THERMOFLATOR (ELECTROSURGICAL) ×2 IMPLANT

## 2014-07-06 NOTE — Anesthesia Procedure Notes (Signed)
Procedure Name: Intubation Date/Time: 07/06/2014 7:24 AM Performed by: KEY, Virgel Gess Pre-anesthesia Checklist: Patient identified, Emergency Drugs available, Suction available, Patient being monitored and Timeout performed Patient Re-evaluated:Patient Re-evaluated prior to inductionOxygen Delivery Method: Circle system utilized Preoxygenation: Pre-oxygenation with 100% oxygen Intubation Type: IV induction Ventilation: Mask ventilation without difficulty Laryngoscope Size: Mac and 3 Grade View: Grade I Tube type: Oral Tube size: 7.5 mm Number of attempts: 1 Airway Equipment and Method: Oral airway (HOB elevated) Placement Confirmation: ETT inserted through vocal cords under direct vision,  positive ETCO2 and breath sounds checked- equal and bilateral Secured at: 22 cm Tube secured with: Tape Dental Injury: Teeth and Oropharynx as per pre-operative assessment

## 2014-07-06 NOTE — Interval H&P Note (Signed)
History and Physical Interval Note:  07/06/2014 7:02 AM  Victoria [name]  has presented today for surgery, with the diagnosis of MORBID OBESITY  The various methods of treatment have been discussed with the patient and family. After consideration of risks, benefits and other options for treatment, the patient has consented to  Procedure(s): LAPAROSCOPIC ROUX-EN-Y GASTRIC BYPASS WITH UPPER ENDOSCOPY (N/A) as a surgical intervention .  The patient's history has been reviewed, patient examined, no change in status, stable for surgery.  I have reviewed the patient's chart and labs.  Questions were answered to the patient's satisfaction.     [name],MATTHEW B

## 2014-07-06 NOTE — Interval H&P Note (Signed)
History and Physical Interval Note:  07/06/2014 7:03 AM  Victoria [name]  has presented today for surgery, with the diagnosis of MORBID OBESITY  The various methods of treatment have been discussed with the patient and family. After consideration of risks, benefits and other options for treatment, the patient has consented to  Procedure(s): LAPAROSCOPIC ROUX-EN-Y GASTRIC BYPASS WITH UPPER ENDOSCOPY (N/A) as a surgical intervention .  The patient's history has been reviewed, patient examined, no change in status, stable for surgery.  I have reviewed the patient's chart and labs.  Questions were answered to the patient's satisfaction.     [name],MATTHEW B

## 2014-07-06 NOTE — Transfer of Care (Signed)
Immediate Anesthesia Transfer of Care Note  Patient: Victoria [name]  Procedure(s) Performed: Procedure(s): LAPAROSCOPIC ROUX-EN-Y GASTRIC BYPASS, ENTEROLYSIS OF ADHESIONS WITH UPPER ENDOSCOPY (N/A) LAPAROSCOPIC REPAIR OF HIATAL HERNIA (N/A)  Patient Location: PACU  Anesthesia Type:General  Level of Consciousness:  sedated, patient cooperative and responds to stimulation  Airway & Oxygen Therapy:Patient Spontanous Breathing and Patient connected to face mask oxgen  Post-op Assessment:  Report given to PACU RN and Post -op Vital signs reviewed and stable  Post vital signs:  Reviewed and stable  Last Vitals:  Filed Vitals:   07/06/14 0532  BP: 117/78  Pulse: 78  Temp: 36.6 C  Resp: 18    Complications: No apparent anesthesia complications

## 2014-07-06 NOTE — Op Note (Signed)
Upper GI endoscopy is performed at the completion of laparoscopic Roux-en-Y gastric bypass by Dr. Hassell Done. The Olympus video endoscope was inserted into the upper esophagus and then passed under direct vision to the EG junction. The small gastric pouch was insufflated with air while the gastric outlet was clamped under irrigation by the operating surgeon. There was no evidence of leak. The anastomosis was visualized and was patent. Suture and staple lines were intact and without bleeding. The pouch was tubular and measured 4-5 cm in length. At the completion of the procedure the pouch was desufflated and the scope withdrawn.

## 2014-07-06 NOTE — Op Note (Signed)
Surgeon: Althea Grimmer. Hassell Done, MD, FACS Asst:  Adonis Housekeeper, MD, FACS Anesthesia: General endotracheal Drains: None  Procedure: Laparoscopic Roux en Y gastric bypass with 40 cm BP limb and 100 cm Roux limb, antecolic, antegastric, candy cane to the left. Two suture posterior closure of hiatus hernia.   Closure of Peterson's defect. Upper endoscopy. Enterolysis x 30 minutes  Description of Procedure:  The patient was taken to OR 1 at [facility] and given general anesthesia.  The abdomen was prepped with PCMX and draped sterilely.  A time out was performed.  I entered through the left upper quadrant with a 12 mm Optiview.  Many adhesions from transverse open chole were taken down with 30 min enterolysis.  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    The operation began by identifying the ligament of Treitz. I measured 40 cm downstream and divided the bowel with a 6 cm Covidian stapler.  I sutured a Penrose drain along the Roux limb end.  I measured a 1 meter (100 cm) Roux limb and then placed the distal bowels to the BP limb side by side and performed a stapled jejunojejunostomy. The common defect was closed from either end with 4-0 Vicryl using the Endo Stitch. The mesenteric defect was closed with a running 2-0 silk using the Endo Stitch. Tisseel was applied to the suture line.  The omentum was divided with the harmonic scalpel.  The Nathanson retractor was inserted in the left lateral segment of liver was retracted. The foregut dissection ensued.  A very obvious hiatal hernia was seen that corresponds to her history of GER despite the "nl UGI".  This was dissected free and repaired posteriorally with two sutures of Surgidek and tyknots.  5 cm pouch measured and constructed with multiple firings of the purple stapler with two naked then TRS covered Covidien stapleer cartridges.    The Roux limb was then brought up with the candycane  pointed left and a back row of sutures of 2-0 Vicryl were placed. I opened along the right side of each structure and inserted the 4.5 cm stapler to create the gastrojejunostomy. The common defect was closed from either end with 2-0 Vicryl and a second row was placed anterior to that the Ewald tube acting as a stent across the anastomosis. The Penrose drain was removed. Peterson's defect was closed with 2-0 silk.   Endoscopy was performed by Dr. Excell Seltzer which showed a 5 cm pouch and no leaks or bleeds  The incisions were injected with Exparel and were closed with 4-0 Vicryl and Liquiban.  The patient was taken to the recovery room in satisfactory condition.  Matt B. Hassell Done, MD, FACS

## 2014-07-06 NOTE — Anesthesia Postprocedure Evaluation (Signed)
  Anesthesia Post-op Note  Patient: Victoria [name]  Procedure(s) Performed: Procedure(s) (LRB): LAPAROSCOPIC ROUX-EN-Y GASTRIC BYPASS, ENTEROLYSIS OF ADHESIONS WITH UPPER ENDOSCOPY (N/A) LAPAROSCOPIC REPAIR OF HIATAL HERNIA (N/A)  Patient Location: PACU  Anesthesia Type: General  Level of Consciousness: awake and alert   Airway and Oxygen Therapy: Patient Spontanous Breathing  Post-op Pain: mild  Post-op Assessment: Post-op Vital signs reviewed, Patient's Cardiovascular Status Stable, Respiratory Function Stable, Patent Airway and No signs of Nausea or vomiting  Last Vitals:  Filed Vitals:   07/06/14 1330  BP: 151/87  Pulse: 56  Temp: 36.4 C  Resp: 18    Post-op Vital Signs: stable   Complications: No apparent anesthesia complications

## 2014-07-06 NOTE — Progress Notes (Signed)
Md aware of BP 175/97.  Orders received

## 2014-07-07 ENCOUNTER — Encounter (HOSPITAL_COMMUNITY): Payer: Self-pay | Admitting: Surgery

## 2014-07-07 ENCOUNTER — Inpatient Hospital Stay (HOSPITAL_COMMUNITY): Payer: 59

## 2014-07-07 LAB — CBC WITH DIFFERENTIAL/PLATELET
Basophils Absolute: 0 10*3/uL (ref 0.0–0.1)
Basophils Relative: 0 % (ref 0–1)
Eosinophils Absolute: 0 10*3/uL (ref 0.0–0.7)
Eosinophils Relative: 0 % (ref 0–5)
HCT: 33.7 % — ABNORMAL LOW (ref 36.0–46.0)
Hemoglobin: 10.9 g/dL — ABNORMAL LOW (ref 12.0–15.0)
Lymphocytes Relative: 10 % — ABNORMAL LOW (ref 12–46)
Lymphs Abs: 0.8 10*3/uL (ref 0.7–4.0)
MCH: 29.4 pg (ref 26.0–34.0)
MCHC: 32.3 g/dL (ref 30.0–36.0)
MCV: 90.8 fL (ref 78.0–100.0)
Monocytes Absolute: 0.8 10*3/uL (ref 0.1–1.0)
Monocytes Relative: 10 % (ref 3–12)
Neutro Abs: 6.4 10*3/uL (ref 1.7–7.7)
Neutrophils Relative %: 80 % — ABNORMAL HIGH (ref 43–77)
Platelets: 204 10*3/uL (ref 150–400)
RBC: 3.71 MIL/uL — ABNORMAL LOW (ref 3.87–5.11)
RDW: 14.4 % (ref 11.5–15.5)
WBC: 8 10*3/uL (ref 4.0–10.5)

## 2014-07-07 LAB — HEMOGLOBIN AND HEMATOCRIT, BLOOD
HCT: 35.4 % — ABNORMAL LOW (ref 36.0–46.0)
Hemoglobin: 11.4 g/dL — ABNORMAL LOW (ref 12.0–15.0)

## 2014-07-07 MED ORDER — DIPHENHYDRAMINE HCL 12.5 MG/5ML PO ELIX
25.0000 mg | ORAL_SOLUTION | Freq: Four times a day (QID) | ORAL | Status: DC | PRN
  Administered 2014-07-07: 25 mg via ORAL
  Filled 2014-07-07: qty 10

## 2014-07-07 MED ORDER — IOHEXOL 300 MG/ML  SOLN
50.0000 mL | Freq: Once | INTRAMUSCULAR | Status: AC | PRN
  Administered 2014-07-07: 50 mL via ORAL

## 2014-07-07 NOTE — Progress Notes (Signed)
Patient ID: Victoria [name], female   DOB: [birth year], 54 y.o.   MRN: [id number] [facility] Progress Note:   1 Day Post-Op  Subjective: Mental status is clear Objective: Vital signs in last 24 hours: Temp:  [97.5 F (36.4 C)-99.1 F (37.3 C)] 98.2 F (36.8 C) (05/11 0557) Pulse Rate:  [56-81] 66 (05/11 0557) Resp:  [9-22] 18 (05/11 0557) BP: (125-179)/(73-107) 132/80 mmHg (05/11 0557) SpO2:  [94 %-100 %] 100 % (05/11 0557)  Intake/Output from previous day: 05/10 0701 - 05/11 0700 In: 3108.3 [I.V.:3108.3] Out: 1905 [Urine:1805; Blood:100] Intake/Output this shift:    Physical Exam: Work of breathing is normal.  Sore but doing well.    Lab Results:  Results for orders placed or performed during the hospital encounter of 07/06/14 (from the past 48 hour(s))  Hemoglobin and hematocrit, blood     Status: None   Collection Time: 07/06/14 11:58 AM  Result Value Ref Range   Hemoglobin 12.7 12.0 - 15.0 g/dL   HCT 38.0 36.0 - 46.0 %  CBC     Status: Abnormal   Collection Time: 07/06/14  3:40 PM  Result Value Ref Range   WBC 11.0 (H) 4.0 - 10.5 K/uL   RBC 3.97 3.87 - 5.11 MIL/uL   Hemoglobin 11.9 (L) 12.0 - 15.0 g/dL   HCT 36.3 36.0 - 46.0 %   MCV 91.4 78.0 - 100.0 fL   MCH 30.0 26.0 - 34.0 pg   MCHC 32.8 30.0 - 36.0 g/dL   RDW 14.4 11.5 - 15.5 %   Platelets 200 150 - 400 K/uL  Creatinine, serum     Status: None   Collection Time: 07/06/14  3:40 PM  Result Value Ref Range   Creatinine, Ser 0.93 0.44 - 1.00 mg/dL   GFR calc non Af Amer >60 >60 mL/min   GFR calc Af Amer >60 >60 mL/min    Comment: (NOTE) The eGFR has been calculated using the CKD EPI equation. This calculation has not been validated in all clinical situations. eGFR's persistently <60 mL/min signify possible Chronic Kidney Disease.   CBC WITH DIFFERENTIAL     Status: Abnormal   Collection Time: 07/07/14  4:00 AM  Result Value Ref Range   WBC 8.0 4.0 - 10.5 K/uL   RBC 3.71 (L) 3.87 - 5.11  MIL/uL   Hemoglobin 10.9 (L) 12.0 - 15.0 g/dL   HCT 33.7 (L) 36.0 - 46.0 %   MCV 90.8 78.0 - 100.0 fL   MCH 29.4 26.0 - 34.0 pg   MCHC 32.3 30.0 - 36.0 g/dL   RDW 14.4 11.5 - 15.5 %   Platelets 204 150 - 400 K/uL   Neutrophils Relative % 80 (H) 43 - 77 %   Neutro Abs 6.4 1.7 - 7.7 K/uL   Lymphocytes Relative 10 (L) 12 - 46 %   Lymphs Abs 0.8 0.7 - 4.0 K/uL   Monocytes Relative 10 3 - 12 %   Monocytes Absolute 0.8 0.1 - 1.0 K/uL   Eosinophils Relative 0 0 - 5 %   Eosinophils Absolute 0.0 0.0 - 0.7 K/uL   Basophils Relative 0 0 - 1 %   Basophils Absolute 0.0 0.0 - 0.1 K/uL    Radiology/Results: No results found.  Anti-infectives: Anti-infectives    Start     Dose/Rate Route Frequency Ordered Stop   07/06/14 0600  cefOXitin (MEFOXIN) 2 g in dextrose 5 % 50 mL IVPB     2 g 100 mL/hr over 30  Minutes Intravenous On call to O.R. 07/06/14 0537 07/06/14 0934      Assessment/Plan: Problem List: Patient Active Problem List   Diagnosis Date Noted  . S/P gastric bypass 07/06/2014  . Chest pain 03/11/2013  . History of Clostridium difficile infection 08/12/2011  . Hypothyroidism, postsurgical 08/26/2010  . GE reflux 08/26/2010  . Vitamin D deficiency 08/26/2010  . Fibrocystic breast disease 08/26/2010  . B12 deficiency 08/26/2010  . OBESITY, UNSPECIFIED 03/20/2010  . BACK PAIN 03/20/2010    UGI this am.  Will advance diet to PD 1 after ugi.   1 Day Post-Op    LOS: 1 day   Matt B. Hassell Done, MD, [facility] [phone] beeper [phone]  07/07/2014 8:10 AM

## 2014-07-07 NOTE — Plan of Care (Signed)
Problem: Food- and Nutrition-Related Knowledge Deficit (NB-1.1) Goal: Nutrition education Formal process to instruct or train a patient/client in a skill or to impart knowledge to help patients/clients voluntarily manage or modify food choices and eating behavior to maintain or improve health. Outcome: Completed/Met Date Met:  07/07/14 Nutrition Education Note  Received consult for diet education per DROP protocol.   Discussed 2 week post op diet with pt. Emphasized that liquids must be non carbonated, non caffeinated, and sugar free. Fluid goals discussed. Pt to follow up with outpatient bariatric RD for further diet progression after 2 weeks. Multivitamins and minerals also reviewed. Teach back method used, pt expressed understanding, expect good compliance.   Diet: First 2 Weeks  You will see the nutritionist about two (2) weeks after your surgery. The nutritionist will increase the types of foods you can eat if you are handling liquids well:  If you have severe vomiting or nausea and cannot handle clear liquids lasting longer than 1 day, call your surgeon  Protein Shake  Drink at least 2 ounces of shake 5-6 times per day  Each serving of protein shakes (usually 8 - 12 ounces) should have a minimum of:  15 grams of protein  And no more than 5 grams of carbohydrate  Goal for protein each day:  Men = 80 grams per day  Women = 60 grams per day  Protein powder may be added to fluids such as non-fat milk or Lactaid milk or Soy milk (limit to 35 grams added protein powder per serving)   Hydration  Slowly increase the amount of water and other clear liquids as tolerated (See Acceptable Fluids)  Slowly increase the amount of protein shake as tolerated  Sip fluids slowly and throughout the day  May use sugar substitutes in small amounts (no more than 6 - 8 packets per day; i.e. Splenda)   Fluid Goal  The first goal is to drink at least 8 ounces of protein shake/drink per day (or as directed  by the nutritionist); some examples of protein shakes are Syntrax Nectar, Adkins Advantage, EAS Edge HP, and Unjury. See handout from pre-op Bariatric Education Class:  Slowly increase the amount of protein shake you drink as tolerated  You may find it easier to slowly sip shakes throughout the day  It is important to get your proteins in first  Your fluid goal is to drink 64 - 100 ounces of fluid daily  It may take a few weeks to build up to this  32 oz (or more) should be clear liquids  And  32 oz (or more) should be full liquids (see below for examples)  Liquids should not contain sugar, caffeine, or carbonation   Clear Liquids:  Water or Sugar-free flavored water (i.e. Fruit H2O, Propel)  Decaffeinated coffee or tea (sugar-free)  Crystal Lite, Wyler's Lite, Minute Maid Lite  Sugar-free Jell-O  Bouillon or broth  Sugar-free Popsicle: *Less than 20 calories each; Limit 1 per day   Full Liquids:  Protein Shakes/Drinks + 2 choices per day of other full liquids  Full liquids must be:  No More Than 12 grams of Carbs per serving  No More Than 3 grams of Fat per serving  Strained low-fat cream soup  Non-Fat milk  Fat-free Lactaid Milk  Sugar-free yogurt (Dannon Lite & Fit, Greek yogurt)     Lindsey Baker, MS, RD, LDN Pager: [phone] After Hours Pager: [phone]        

## 2014-07-07 NOTE — Progress Notes (Signed)
Patient alert and oriented, Post op day 1.  Provided support and encouragement.  Encouraged pulmonary toilet, ambulation and small sips of liquids.  All questions answered.  Will continue to monitor. 

## 2014-07-07 NOTE — Care Management Note (Signed)
Case Management Note  Patient Details  Name: Victoria [name] MRN: [id number] Date of Birth: 02-Apr-1960  Subjective/Objective:              Admitted s/p gastric bypass       Action/Plan: Discharge planning  Expected Discharge Date:  07/09/14               Expected Discharge Plan:  Home/Self Care  In-House Referral:  NA  Discharge planning Services  CM Consult  Status of Service:  Completed, signed off  Medicare Important Message Given:    Date Medicare IM Given:    Medicare IM give by:    Date Additional Medicare IM Given:    Additional Medicare Important Message give by:     If discussed at [place] of Stay Meetings, dates discussed:    Additional Comments:  Guadalupe Maple, RN 07/07/2014, 9:13 AM

## 2014-07-08 LAB — CBC WITH DIFFERENTIAL/PLATELET
Basophils Absolute: 0 10*3/uL (ref 0.0–0.1)
Basophils Relative: 0 % (ref 0–1)
Eosinophils Absolute: 0.1 10*3/uL (ref 0.0–0.7)
Eosinophils Relative: 2 % (ref 0–5)
HCT: 33 % — ABNORMAL LOW (ref 36.0–46.0)
Hemoglobin: 10.6 g/dL — ABNORMAL LOW (ref 12.0–15.0)
Lymphocytes Relative: 27 % (ref 12–46)
Lymphs Abs: 1.4 10*3/uL (ref 0.7–4.0)
MCH: 29.5 pg (ref 26.0–34.0)
MCHC: 32.1 g/dL (ref 30.0–36.0)
MCV: 91.9 fL (ref 78.0–100.0)
Monocytes Absolute: 0.4 10*3/uL (ref 0.1–1.0)
Monocytes Relative: 7 % (ref 3–12)
Neutro Abs: 3.3 10*3/uL (ref 1.7–7.7)
Neutrophils Relative %: 64 % (ref 43–77)
Platelets: 194 10*3/uL (ref 150–400)
RBC: 3.59 MIL/uL — ABNORMAL LOW (ref 3.87–5.11)
RDW: 14.7 % (ref 11.5–15.5)
WBC: 5.1 10*3/uL (ref 4.0–10.5)

## 2014-07-08 NOTE — Discharge Instructions (Signed)

## 2014-07-08 NOTE — Discharge Summary (Signed)
Physician Discharge Summary  Patient ID: Victoria [name] MRN: [id number] DOB/AGE: October 07, 1960 54 y.o.  Admit date: 07/06/2014 Discharge date: 07/08/2014  Admission Diagnoses:  Morbid obesity  Discharge Diagnoses:  same  Active Problems:   Lap gastric bypass May 2016   Surgery:  Lap roux en Y gastric bypass  Discharged Condition: improved  Hospital Course:   Had surgery.  UGI on PD 1.  Did well with PO and ready for discharge on PD 2  Consults: none  Significant Diagnostic Studies: UGI    Discharge Exam: Blood pressure 135/87, pulse 79, temperature 98.1 F (36.7 C), temperature source Oral, resp. rate 18, height 5\' 6"  (1.676 m), weight 124.002 kg (273 lb 6 oz), SpO2 100 %. Incisions OK  Disposition: 01-Home or Self Care  Discharge Instructions    Ambulate hourly while awake    Complete by:  As directed      Call MD for:  difficulty breathing, headache or visual disturbances    Complete by:  As directed      Call MD for:  persistant dizziness or light-headedness    Complete by:  As directed      Call MD for:  persistant nausea and vomiting    Complete by:  As directed      Call MD for:  redness, tenderness, or signs of infection (pain, swelling, redness, odor or green/yellow discharge around incision site)    Complete by:  As directed      Call MD for:  severe uncontrolled pain    Complete by:  As directed      Call MD for:  temperature >101 F    Complete by:  As directed      Diet bariatric full liquid    Complete by:  As directed      Incentive spirometry    Complete by:  As directed   Perform hourly while awake            Medication List    STOP taking these medications        aspirin 81 MG tablet     HYDROcodone-acetaminophen 10-325 MG per tablet  Commonly known as:  NORCO     ibuprofen 800 MG tablet  Commonly known as:  ADVIL,MOTRIN      TAKE these medications        cyanocobalamin 1000 MCG/ML injection  Commonly known as:  (VITAMIN B-12)   INJECT 1 ML INTRAMUSCULARLY EVERY MONTH. Include needles and syringes for one year.     cyclobenzaprine 10 MG tablet  Commonly known as:  FLEXERIL  Take 1 tablet (10 mg total) by mouth at bedtime.     estradiol 1 MG tablet  Commonly known as:  ESTRACE  TAKE 1 TABLET BY MOUTH ONCE DAILY     levothyroxine 150 MCG tablet  Commonly known as:  SYNTHROID, LEVOTHROID  TAKE 1 TABLET BY MOUTH ONCE DAILY     pantoprazole 40 MG tablet  Commonly known as:  PROTONIX  Take 1 tablet (40 mg total) by mouth 2 (two) times daily.     promethazine 25 MG tablet  Commonly known as:  PHENERGAN  Take 1 tablet (25 mg total) by mouth every 8 (eight) hours as needed for nausea or vomiting.     sucralfate 1 GM/10ML suspension  Commonly known as:  CARAFATE  Take 10 mLs (1 g total) by mouth 2 (two) times daily.     SUMAtriptan 100 MG tablet  Commonly known as:  IMITREX  One po at onset of migraine     valACYclovir 500 MG tablet  Commonly known as:  VALTREX  TAKE 1 TABLET BY MOUTH ONCE DAILY           Follow-up Information    Follow up with Pedro Earls, MD. Go on 07/21/2014.   Specialty:  General Surgery   Why:  For Post-Op Check @ 3:30PM   Contact information:   [address] [place] [address] [phone]       Signed: Pedro Earls 07/08/2014, 10:16 AM

## 2014-07-08 NOTE — Progress Notes (Signed)
Patient alert and oriented, pain is controlled. Patient is tolerating fluids,  advanced to protein shake today, patient tolerated well. Reviewed Gastric Bypass discharge instructions with patient and patient is able to articulate understanding. Provided information on BELT program, Support Group and [facility] outpatient pharmacy. All questions answered, will continue to monitor.    

## 2014-07-09 ENCOUNTER — Telehealth (HOSPITAL_COMMUNITY): Payer: Self-pay

## 2014-07-09 NOTE — Telephone Encounter (Addendum)
Attempted DROP discharge call, no answer, left lessage to return call Patient returned call, I was out of the office, returned call 07/12/2014  Made discharge phone call to patient per DROP protocol. Asking the following questions.    1. Do you have someone to care for you now that you are home?  yes 2. Are you having pain now that is not relieved by your pain medication?  no 3. Are you able to drink the recommended daily amount of fluids (48 ounces minimum/day) and protein (60-80 grams/day) as prescribed by the dietitian or nutritional counselor?   Trying to, but I cannot stand my protein shakes, I am going today to get new kinds 4. Are you taking the vitamins and minerals as prescribed?  yes 5. Do you have the "on call" number to contact your surgeon if you have a problem or question?  yes 6. Are your incisions free of redness, swelling or drainage? (If steri strips, address that these can fall off, shower as tolerated) yes 7. Have your bowels moved since your surgery?  If not, are you passing gas?  yes 8. Are you up and walking 3-4 times per day?  yes    1. Do you have an appointment made to see your surgeon in the next month?  yes 2. Were you provided your discharge medications before your surgery or before you were discharged from the hospital and are you taking them without problem?  yes 3. Were you provided phone numbers to the clinic/surgeon's office?  yes 4. Did you watch the patient education video module in the (clinic, surgeon's office, etc.) before your surgery? yes 5. Do you have a discharge checklist that was provided to you in the hospital to reference with instructions on how to take care of yourself after surgery?  yes 6. Did you see a dietitian or nutritional counselor while you were in the hospital?  yes 7. Do you have an appointment to see a dietitian or nutritional counselor in the next month?  yes

## 2014-07-15 ENCOUNTER — Telehealth: Payer: Self-pay | Admitting: Dietician

## 2014-07-15 NOTE — Telephone Encounter (Signed)
Patient called [facility] reporting that she has taste aversion to protein shakes. At my suggestion earlier this week, she tried adding protein powder to yogurt and broth but did not tolerate this. She reports that she feels ready to add soft protein foods. I recommended the patient add eggs, deli meat, cheese, and seafood. Encouraged her to try 1 food at a time, take tiny bites, and chew thoroughly. Patient verbalized understanding.

## 2014-07-20 ENCOUNTER — Encounter: Payer: 59 | Attending: Surgery

## 2014-07-20 DIAGNOSIS — Z6841 Body Mass Index (BMI) 40.0 and over, adult: Secondary | ICD-10-CM | POA: Diagnosis not present

## 2014-07-20 DIAGNOSIS — Z713 Dietary counseling and surveillance: Secondary | ICD-10-CM | POA: Insufficient documentation

## 2014-07-20 NOTE — Progress Notes (Signed)
Bariatric Class:  Appt start time: 1530 end time:  1630.  2 Week Post-Operative Nutrition Class  Patient was seen on 07/20/14 for Post-Operative Nutrition [facility].    Surgery date: 07/06/2014 Surgery type: RYGB Start weight at [facility]: 286 lbs on 05/01/14 Weight today: 266.5 lbs  Weight change: 22 lbs  TANITA  BODY COMP RESULTS  06/21/14 07/20/14   BMI (kg/m^2) 45.9 42.4   Fat Mass (lbs) 154.5 143.5   Fat Free Mass (lbs) 134 123.0   Total Body Water (lbs) 98 90.0    The following the learning objectives were met by the patient during this course:  Identifies Phase 3A (Soft, High Proteins) Dietary Goals and will begin from 2 weeks post-operatively to 2 months post-operatively  Identifies appropriate sources of fluids and proteins   States protein recommendations and appropriate sources post-operatively  Identifies the need for appropriate texture modifications, mastication, and bite sizes when consuming solids  Identifies appropriate multivitamin and calcium sources post-operatively  Describes the need for physical activity post-operatively and will follow MD recommendations  States when to call healthcare provider regarding medication questions or post-operative complications  Handouts given during class include:  Phase 3A: Soft, High Protein Diet Handout  Follow-Up Plan: Patient will follow-up at [facility] in 6 weeks for 2 month post-op nutrition visit for diet advancement per MD.

## 2014-08-17 ENCOUNTER — Encounter: Payer: Self-pay | Admitting: Internal Medicine

## 2014-08-17 ENCOUNTER — Ambulatory Visit (INDEPENDENT_AMBULATORY_CARE_PROVIDER_SITE_OTHER): Payer: 59 | Admitting: Internal Medicine

## 2014-08-17 VITALS — BP 108/70 | HR 78 | Temp 97.6°F | Wt 243.0 lb

## 2014-08-17 DIAGNOSIS — R5383 Other fatigue: Secondary | ICD-10-CM

## 2014-08-17 DIAGNOSIS — E039 Hypothyroidism, unspecified: Secondary | ICD-10-CM | POA: Diagnosis not present

## 2014-08-17 DIAGNOSIS — E538 Deficiency of other specified B group vitamins: Secondary | ICD-10-CM | POA: Diagnosis not present

## 2014-08-17 DIAGNOSIS — Z9884 Bariatric surgery status: Secondary | ICD-10-CM

## 2014-08-17 LAB — CBC WITH DIFFERENTIAL/PLATELET
Basophils Absolute: 0 10*3/uL (ref 0.0–0.1)
Basophils Relative: 0 % (ref 0–1)
Eosinophils Absolute: 0.1 10*3/uL (ref 0.0–0.7)
Eosinophils Relative: 3 % (ref 0–5)
HCT: 36.8 % (ref 36.0–46.0)
Hemoglobin: 12.3 g/dL (ref 12.0–15.0)
Lymphocytes Relative: 24 % (ref 12–46)
Lymphs Abs: 1.2 10*3/uL (ref 0.7–4.0)
MCH: 30.6 pg (ref 26.0–34.0)
MCHC: 33.4 g/dL (ref 30.0–36.0)
MCV: 91.5 fL (ref 78.0–100.0)
MPV: 12 fL (ref 8.6–12.4)
Monocytes Absolute: 0.4 10*3/uL (ref 0.1–1.0)
Monocytes Relative: 8 % (ref 3–12)
Neutro Abs: 3.1 10*3/uL (ref 1.7–7.7)
Neutrophils Relative %: 65 % (ref 43–77)
Platelets: 192 10*3/uL (ref 150–400)
RBC: 4.02 MIL/uL (ref 3.87–5.11)
RDW: 16.7 % — ABNORMAL HIGH (ref 11.5–15.5)
WBC: 4.8 10*3/uL (ref 4.0–10.5)

## 2014-08-17 LAB — COMPREHENSIVE METABOLIC PANEL
ALT: 23 U/L (ref 0–35)
AST: 16 U/L (ref 0–37)
Albumin: 3.6 g/dL (ref 3.5–5.2)
Alkaline Phosphatase: 73 U/L (ref 39–117)
BUN: 14 mg/dL (ref 6–23)
CO2: 23 mEq/L (ref 19–32)
Calcium: 9.5 mg/dL (ref 8.4–10.5)
Chloride: 101 mEq/L (ref 96–112)
Creat: 0.75 mg/dL (ref 0.50–1.10)
Glucose, Bld: 77 mg/dL (ref 70–99)
Potassium: 3.4 mEq/L — ABNORMAL LOW (ref 3.5–5.3)
Sodium: 143 mEq/L (ref 135–145)
Total Bilirubin: 0.5 mg/dL (ref 0.2–1.2)
Total Protein: 7.4 g/dL (ref 6.0–8.3)

## 2014-08-17 LAB — IRON AND TIBC
%SAT: 21 % (ref 20–55)
Iron: 56 ug/dL (ref 42–145)
TIBC: 272 ug/dL (ref 250–470)
UIBC: 216 ug/dL (ref 125–400)

## 2014-08-17 LAB — FOLATE: Folate: 14.2 ng/mL

## 2014-08-17 LAB — T4, FREE: Free T4: 1.76 ng/dL (ref 0.80–1.80)

## 2014-08-17 LAB — TSH: TSH: 0.012 u[IU]/mL — ABNORMAL LOW (ref 0.350–4.500)

## 2014-08-17 LAB — VITAMIN B12: Vitamin B-12: 1220 pg/mL — ABNORMAL HIGH (ref 211–911)

## 2014-08-17 MED ORDER — CYANOCOBALAMIN 1000 MCG/ML IJ SOLN: INTRAMUSCULAR | Status: DC

## 2014-08-17 NOTE — Patient Instructions (Signed)
Lab work is pending. Find multivitamin in liquid form to take on a daily basis. Return in 6 months or as needed.

## 2014-08-17 NOTE — Progress Notes (Signed)
   Subjective:    Patient ID: Victoria [name], female    DOB: [birth year], 54 y.o.   MRN: [id number]  HPI Patient is 6 weeks out from gastric bypass surgery. Has felt very fatigued. Has lost 32 pounds since December 2015. Patient needs to find a multivitamin that she can swallow. Preferably a liquid formation might be better for her. She might find she needs to crush summer for medications. Needs refill on B-12 injection. Because of fatigue we will check a number of lab studies including B-12, folate, iron and iron-binding capacity, CBC, C met, TSH, free T4, vitamin D, prealbumin. She has follow-up with her surgeon July 1. She's planning on returning to work July 6.  Also, she recently got married and went to [place] on a honeymoon. When she returned, new husband developed gangrene and toe and had to have toe amputation  Review of Systems see above     Objective:   Physical Exam  Not examined. Spent 15 minutes speaking with her about her concerns.      Assessment & Plan:  Status post gastric bypass surgery  Hypothyroidism  History of B-12 deficiency  Plan: See above labs which will be obtained. Schedule physical exam in 6 months.

## 2014-08-18 LAB — PREALBUMIN: Prealbumin: 9 mg/dL — ABNORMAL LOW (ref 17–34)

## 2014-08-18 LAB — VITAMIN D 25 HYDROXY (VIT D DEFICIENCY, FRACTURES): Vit D, 25-Hydroxy: 38 ng/mL (ref 30–100)

## 2014-08-20 ENCOUNTER — Ambulatory Visit: Payer: 59 | Admitting: Internal Medicine

## 2014-08-26 ENCOUNTER — Encounter: Payer: Self-pay | Admitting: Dietician

## 2014-08-26 ENCOUNTER — Encounter: Payer: 59 | Attending: Surgery | Admitting: Dietician

## 2014-08-26 VITALS — Ht 66.5 in | Wt 250.5 lb

## 2014-08-26 DIAGNOSIS — Z6841 Body Mass Index (BMI) 40.0 and over, adult: Secondary | ICD-10-CM | POA: Diagnosis not present

## 2014-08-26 DIAGNOSIS — E669 Obesity, unspecified: Secondary | ICD-10-CM

## 2014-08-26 DIAGNOSIS — Z713 Dietary counseling and surveillance: Secondary | ICD-10-CM | POA: Diagnosis not present

## 2014-08-26 NOTE — Progress Notes (Signed)
  Follow-up visit:  7 Weeks Post-Operative RYGB Surgery  Medical Nutrition Therapy:  Appt start time: 430 end time:  35  Primary concerns today: Post-operative Bariatric Surgery Nutrition Management.  Avonna states that she has been struggling. She reports "when I take a bite of something I don't want it anymore." She has been aiming for 30 grams of protein and 32 oz of fluid. However, she is eating crackers and peanuts.  Surgery date: 07/06/2014 Surgery type: RYGB Start weight at [facility]: 286 lbs on 05/01/14 Weight today: 250.5 lbs Weight change: 16 lbs Total weight loss: 35.5 lbs  TANITA  BODY COMP RESULTS  06/21/14 07/20/14 08/26/14   BMI (kg/m^2) 45.9 42.4 39.8   Fat Mass (lbs) 154.5 143.5 126   Fat Free Mass (lbs) 134 123.0 124.5   Total Body Water (lbs) 98 90.0 91    Preferred Learning Style:   No preference indicated   Learning Readiness:   Ready  24-hr recall: B (AM): 8 oz 1% milk (8g) Snk (AM): a little over 1/2 scrambled egg (4g)  L (PM): 1 oz chicken (7g) Snk (PM):   D (PM): peanuts and peanut butter crackers Snk (PM): 8 oz 1% milk (8g)  Fluid intake: 1% milk, water Estimated total protein intake: varies, insufficient  Medications: see list Supplementation: taking liquid form  Using straws: no Drinking while eating: no Hair loss: no Carbonated beverages: no N/V/D/C: some vomiting Dumping syndrome: none  Recent physical activity:  Did not assess today  Progress Towards Goal(s):  In progress.  Handouts given during visit include:  Phase 3B lean protein + non starchy vegetables  Vegetarian proteins  Bariatric snack list   Nutritional Diagnosis:  [place]-3.3 Overweight/obesity related to past poor dietary habits and physical inactivity as evidenced by patient w/ recent RYGB surgery following dietary guidelines for continued weight loss.     Intervention:  Nutrition counseling provided. Goals:  Follow Phase 3B: High Protein + Non-Starchy  Vegetables  Eat 3-6 small meals/snacks, every 3-5 hrs  Increase lean protein foods to meet 60g goal  Eat something with protein 5-6 times per day (try to eat something at least every 2 hours)  Chicken salad with relish, cheese stick, [place] sausage/bacon, beans, lean ground beef, deviled egg or egg salad with relish, nuts, barbecue  Try different flavors of cheese  Try Atkins Lift protein water, Quest protein bar, and protein shots  Increase fluid intake to 64oz +  Avoid drinking 15 minutes before, during and 30 minutes after eating  Aim for >30 min of physical activity daily  Teaching Method Utilized:  Visual Auditory Hands on  Barriers to learning/adherence to lifestyle change: taste changes  Demonstrated degree of understanding via:  Teach Back   Monitoring/Evaluation:  Dietary intake, exercise, and body weight. Follow up in 1 months for 3 month post-op visit.

## 2014-08-26 NOTE — Patient Instructions (Addendum)
Goals:  Follow Phase 3B: High Protein + Non-Starchy Vegetables  Eat 3-6 small meals/snacks, every 3-5 hrs  Increase lean protein foods to meet 60g goal  Eat something with protein 5-6 times per day (try to eat something at least every 2 hours)  Chicken salad with relish, cheese stick, [place] sausage/bacon, beans, lean ground beef, deviled egg or egg salad with relish, nuts, barbecue  Try different flavors of cheese  Try Atkins Lift protein water, Quest protein bar, and protein shots  Increase fluid intake to 64oz +  Avoid drinking 15 minutes before, during and 30 minutes after eating  Aim for >30 min of physical activity daily   TANITA  BODY COMP RESULTS  06/21/14 07/20/14 08/26/14   BMI (kg/m^2) 45.9 42.4 39.8   Fat Mass (lbs) 154.5 143.5 126   Fat Free Mass (lbs) 134 123.0 124.5   Total Body Water (lbs) 98 90.0 91

## 2014-09-01 ENCOUNTER — Encounter: Payer: Self-pay | Admitting: Internal Medicine

## 2014-09-01 ENCOUNTER — Other Ambulatory Visit: Payer: Self-pay | Admitting: Internal Medicine

## 2014-09-01 ENCOUNTER — Telehealth: Payer: Self-pay | Admitting: Internal Medicine

## 2014-09-01 MED ORDER — LEVOTHYROXINE SODIUM 125 MCG PO TABS: 125.0000 ug | ORAL_TABLET | Freq: Every day | ORAL | Status: DC

## 2014-09-01 NOTE — Telephone Encounter (Signed)
At last visit decreased levothyroxine from 0.15 mg to 0.125 mg daily. Pharmacy requesting refill on 0.15 mg daily which is not appropriate at this point in time. Change to 0.125 mg daily. Plan to follow-up in several weeks per last office visit

## 2014-09-02 ENCOUNTER — Ambulatory Visit: Payer: 59 | Admitting: Dietician

## 2014-09-27 ENCOUNTER — Encounter: Payer: Self-pay | Admitting: Dietician

## 2014-09-27 ENCOUNTER — Encounter: Payer: 59 | Attending: Surgery | Admitting: Dietician

## 2014-09-27 VITALS — Ht 66.5 in | Wt 244.0 lb

## 2014-09-27 DIAGNOSIS — Z713 Dietary counseling and surveillance: Secondary | ICD-10-CM | POA: Diagnosis not present

## 2014-09-27 DIAGNOSIS — Z6841 Body Mass Index (BMI) 40.0 and over, adult: Secondary | ICD-10-CM | POA: Diagnosis not present

## 2014-09-27 DIAGNOSIS — E669 Obesity, unspecified: Secondary | ICD-10-CM

## 2014-09-27 NOTE — Patient Instructions (Addendum)
Goals:  Follow Phase 3B: High Protein + Non-Starchy Vegetables  Eat 3-6 small meals/snacks, every 3-5 hrs  Increase lean protein foods to meet 60g goal  Eat something with protein to at least 3 times per day (try to eat something at least every 2 hours)  Avoid skipping meals  Chicken salad with relish, cheese stick, [place] sausage/bacon, beans, lean ground beef, deviled egg or egg salad with relish, nuts, barbecue, Quest protein chips  Try different flavors of cheese  Increase fluid intake to 64oz +  Avoid drinking 15 minutes before, during and 30 minutes after eating  Aim for >30 min of physical activity daily   Surgery date: 07/06/2014 Surgery type: RYGB [facility] weight at [facility]: 286 lbs on 05/01/14 (highest weight 291 per patient) Weight today: 244 lbs Weight change: 6.5 lbs (16 lbs fat) Total weight loss: 47 lbs  TANITA  BODY COMP RESULTS  06/21/14 07/20/14 08/26/14 09/27/14   BMI (kg/m^2) 45.9 42.4 39.8 38.8   Fat Mass (lbs) 154.5 143.5 126 110   Fat Free Mass (lbs) 134 123.0 124.5 134   Total Body Water (lbs) 98 90.0 91 98

## 2014-09-27 NOTE — Progress Notes (Signed)
  Follow-up visit: 3 months Post-Operative RYGB Surgery  Medical Nutrition Therapy:  Appt start time: 435 end time: 500  Primary concerns today: Post-operative Bariatric Surgery Nutrition Management.  Victoria [name] returns having lost another 16 lbs of fat. She is having a very hard time meeting protein needs, barely 1 oz at a time. However, protein intake is slowly improving. Drinks a lot of 1% milk. Still having a Premier protein shake 2-3 days a week to meet protein requirements.   Surgery date: 07/06/2014 Surgery type: RYGB Start weight at [facility]: 286 lbs on 05/01/14 (highest weight 291 per patient) Weight today: 244 lbs Weight change: 6.5 lbs (16 lbs fat) Total weight loss: 47 lbs  TANITA  BODY COMP RESULTS  06/21/14 07/20/14 08/26/14 09/27/14   BMI (kg/m^2) 45.9 42.4 39.8 38.8   Fat Mass (lbs) 154.5 143.5 126 110   Fat Free Mass (lbs) 134 123.0 124.5 134   Total Body Water (lbs) 98 90.0 91 98    Preferred Learning Style:   No preference indicated   Learning Readiness:   Ready  24-hr recall: B (5AM): 8 oz 1% milk (16g) Snk (8AM): Dannon [employer] and Fit [place] yogurt (12g)  L (PM): 1/4 cup cabbage OR pork and beans (0-6g) Snk (PM): peanut butter crackers  D (PM): sometimes skips Snk (PM): sometimes 8 oz 1% milk (0-8g)  Fluid intake: 64 oz fluid most days (milk, protein shake, water, sugar free twist) Estimated total protein intake: varies, insufficient  Medications: see list Supplementation: taking liquid form, sometimes forgets Calcium  Using straws: yes, doesn't feel like like it adds gas Drinking while eating: no Hair loss: no Carbonated beverages: no N/V/D/C: some nausea with certain smells Dumping syndrome: none  Recent physical activity:  Walking 6,000-8,000 steps per day  Progress Towards Goal(s):  In progress.    Nutritional Diagnosis:  Victoria [name]-3.3 Overweight/obesity related to past poor dietary habits and physical inactivity as evidenced by patient w/ recent RYGB surgery  following dietary guidelines for continued weight loss.     Intervention:  Nutrition counseling provided.  Goals:  Follow Phase 3B: High Protein + Non-Starchy Vegetables  Eat 3-6 small meals/snacks, every 3-5 hrs  Increase lean protein foods to meet 60g goal  Eat something with protein to at least 3 times per day (try to eat something at least every 2 hours)  Avoid skipping meals  Chicken salad with relish, cheese stick, [place] sausage/bacon, beans, lean ground beef, deviled egg or egg salad with relish, nuts, barbecue, Quest protein chips  Try different flavors of cheese  Increase fluid intake to 64oz +  Avoid drinking 15 minutes before, during and 30 minutes after eating  Aim for >30 min of physical activity daily  Teaching Method Utilized:  Visual Auditory Hands on  Barriers to learning/adherence to lifestyle change: taste changes  Demonstrated degree of understanding via:  Teach Back   Monitoring/Evaluation:  Dietary intake, exercise, and body weight. Follow up in 6 weeks for 4.5 month post-op visit.

## 2014-10-07 ENCOUNTER — Ambulatory Visit (INDEPENDENT_AMBULATORY_CARE_PROVIDER_SITE_OTHER): Payer: 59 | Admitting: Podiatry

## 2014-10-07 ENCOUNTER — Ambulatory Visit (INDEPENDENT_AMBULATORY_CARE_PROVIDER_SITE_OTHER): Payer: 59

## 2014-10-07 ENCOUNTER — Encounter: Payer: Self-pay | Admitting: Podiatry

## 2014-10-07 DIAGNOSIS — M722 Plantar fascial fibromatosis: Secondary | ICD-10-CM

## 2014-10-07 DIAGNOSIS — M79673 Pain in unspecified foot: Secondary | ICD-10-CM

## 2014-10-07 DIAGNOSIS — M2040 Other hammer toe(s) (acquired), unspecified foot: Secondary | ICD-10-CM

## 2014-10-07 MED ORDER — TRIAMCINOLONE ACETONIDE 10 MG/ML IJ SUSP
10.0000 mg | Freq: Once | INTRAMUSCULAR | Status: AC
  Administered 2014-10-07: 10 mg

## 2014-10-07 NOTE — Progress Notes (Signed)
Subjective:     Patient ID: Victoria [name], female   DOB: 04/24/1960, 54 y.o.   MRN: [id number]  HPI patient states my heels on both feet have been bothering me and I have a corn on my fifth toe right where I had surgery done years ago. States that she knows that she has flatfeet which are probably a part of her problem and states that her heels been bothering her for a fairly long time   Review of Systems  All other systems reviewed and are negative.      Objective:   Physical Exam  Constitutional: She is oriented to person, place, and time.  Cardiovascular: Intact distal pulses.   Musculoskeletal: Normal range of motion.  Neurological: She is oriented to person, place, and time.  Skin: Skin is warm.  Nursing note and vitals reviewed.  neurovascular status intact with muscle strength adequate range of motion within normal limits and patient's found to have significant flatfoot deformity with discomfort in the plantar fascia at the insertion of the tendon into the calcaneus bilateral. The right fifth toe has a keratotic lesion that's painful when pressed and are difficult. Good digital perfusion is noted and patient is well oriented 3     Assessment:     Plantar fasciitis with foot structural issues bilateral along with hammertoe keratotic lesion formation fifth digit right    Plan:     H&P and conditions reviewed with patient. Today I injected the plantar fascia that is extremely tender with 3 mg Kenalog 5 mg Xylocaine bilateral and applied fascial brace bilateral. I gave instructions on physical therapy shoe gear modification and we discussed long-term orthotics. I debrided the lesion on the fifth toe and we will see how long that gives her relief and decide whether arthroplasty will be appropriate long-term reappoint one week

## 2014-10-07 NOTE — Progress Notes (Signed)
   Subjective:    Patient ID: Victoria [name], female    DOB: 07/03/1960, 54 y.o.   MRN: [id number]  HPI Pt presents with right 5th toe corn painful and bilateral heel pain lasting 2 months, worsened by prolonged standing, pain is located on medial side of foot in arch area   Review of Systems  Constitutional: Positive for unexpected weight change.  Cardiovascular: Positive for leg swelling.  Gastrointestinal: Positive for nausea.  Endocrine: Positive for cold intolerance and heat intolerance.  Musculoskeletal: Positive for arthralgias and gait problem.  Neurological: Positive for headaches.  All other systems reviewed and are negative.      Objective:   Physical Exam        Assessment & Plan:

## 2014-10-07 NOTE — Patient Instructions (Signed)

## 2014-10-14 ENCOUNTER — Ambulatory Visit: Payer: 59 | Admitting: Podiatry

## 2014-10-21 ENCOUNTER — Ambulatory Visit: Payer: 59 | Admitting: Podiatry

## 2014-11-10 ENCOUNTER — Encounter: Payer: Self-pay | Admitting: Dietician

## 2014-11-10 ENCOUNTER — Encounter: Payer: 59 | Attending: Surgery | Admitting: Dietician

## 2014-11-10 VITALS — Ht 66.5 in | Wt 220.5 lb

## 2014-11-10 DIAGNOSIS — Z713 Dietary counseling and surveillance: Secondary | ICD-10-CM | POA: Diagnosis not present

## 2014-11-10 DIAGNOSIS — E669 Obesity, unspecified: Secondary | ICD-10-CM

## 2014-11-10 DIAGNOSIS — Z6841 Body Mass Index (BMI) 40.0 and over, adult: Secondary | ICD-10-CM | POA: Diagnosis not present

## 2014-11-10 NOTE — Patient Instructions (Addendum)
Goals:  Follow Phase 3B: High Protein + Non-Starchy Vegetables  Eat 3-6 small meals/snacks, every 3-5 hrs  Increase lean protein foods to meet 60g goal  Eat something with protein to at least 3 times per day (try to eat something at least every 2 hours)  Cheese, beans, milk, protein shake, beans, nuts  Avoid skipping meals  Continue to have a protein shake if you can't eat and/or if you don't have time to eat  Try a black bean burger (Morningstar brand in the freezer section)  Try different flavors of cheese  Increase fluid intake to 64oz +  Avoid drinking 15 minutes before, during and 30 minutes after eating  Aim for >30 min of physical activity daily  Limit carbs to 15 grams per meal (protein first!)  Try edamame   Surgery date: 07/06/2014 Surgery type: RYGB Start weight at [facility]: 286 lbs on 05/01/14 (highest weight 291 per patient) Weight today: 220.5 lbs Weight change: 23.5 lbs Total weight loss: 70.5 lbs  TANITA  BODY COMP RESULTS  06/21/14 07/20/14 08/26/14 09/27/14 11/10/14   BMI (kg/m^2) 45.9 42.4 39.8 38.8 35.1   Fat Mass (lbs) 154.5 143.5 126 110 98   Fat Free Mass (lbs) 134 123.0 124.5 134 122.5   Total Body Water (lbs) 98 90.0 91 98 89.5

## 2014-11-10 NOTE — Progress Notes (Signed)
  Follow-up visit: 4 months Post-Operative RYGB Surgery  Medical Nutrition Therapy:  Appt start time: 410 end time: 445  Primary concerns today: Post-operative Bariatric Surgery Nutrition Management.  Victoria [name] returns having lost another 23.5 lbs. Still having a hard time meeting protein needs. Struggling with food aversions. Sometimes only able to eat a bite or two at a time. "Meat does not taste good anymore." having a protein shake about 3x a week. Likes vegetables better than meat.  Samples provided and patient instructed on proper use: Unjury protein powder (chicken soup - qty 2) Lot#: [id number] Exp: 08/2015   Surgery date: 07/06/2014 Surgery type: RYGB Start weight at [facility]: 286 lbs on 05/01/14 (highest weight 291 per patient) Weight today: 220.5 lbs Weight change: 23.5 lbs Total weight loss: 70.5 lbs  TANITA  BODY COMP RESULTS  06/21/14 07/20/14 08/26/14 09/27/14 11/10/14   BMI (kg/m^2) 45.9 42.4 39.8 38.8 35.1   Fat Mass (lbs) 154.5 143.5 126 110 98   Fat Free Mass (lbs) 134 123.0 124.5 134 122.5   Total Body Water (lbs) 98 90.0 91 98 89.5    Preferred Learning Style:   No preference indicated   Learning Readiness:   Ready  24-hr recall: B (5AM): 8 oz 1% milk (16g) Snk (8AM):  L (PM): 1/2-1 cup [employer] broccoli cheddar soup (5-9g) Snk (PM): cheese crackers D (PM): about 1 oz chicken and baked beans (10g) Snk (PM):   Fluid intake: 64 oz fluid most days (milk, protein shake, water, water with sugar free flavoring, sugar free twist) Estimated total protein intake: varies, insufficient  Medications: see list Supplementation: taking liquid form, sometimes forgets Calcium  Using straws: yes, doesn't feel like like it adds gas Drinking while eating: no Hair loss: no Carbonated beverages: no N/V/D/C: some nausea with certain smells; no vomiting; diarrhea with watermelon Dumping syndrome: none  Recent physical activity:  Walking 6,000-8,000 steps per day  Progress Towards  Goal(s):  In progress.    Nutritional Diagnosis:  [place]-3.3 Overweight/obesity related to past poor dietary habits and physical inactivity as evidenced by patient w/ recent RYGB surgery following dietary guidelines for continued weight loss.     Intervention:  Nutrition counseling provided.  Teaching Method Utilized:  Visual Auditory Hands on  Barriers to learning/adherence to lifestyle change: taste changes  Demonstrated degree of understanding via:  Teach Back   Monitoring/Evaluation:  Dietary intake, exercise, and body weight. Follow up in 6 weeks for 6 month post-op visit.

## 2014-12-21 ENCOUNTER — Encounter: Payer: Self-pay | Admitting: Dietician

## 2014-12-21 ENCOUNTER — Ambulatory Visit: Payer: 59 | Admitting: Dietician

## 2014-12-21 ENCOUNTER — Encounter: Payer: 59 | Attending: Surgery | Admitting: Dietician

## 2014-12-21 VITALS — Ht 66.5 in | Wt 214.5 lb

## 2014-12-21 DIAGNOSIS — Z6841 Body Mass Index (BMI) 40.0 and over, adult: Secondary | ICD-10-CM | POA: Insufficient documentation

## 2014-12-21 DIAGNOSIS — E669 Obesity, unspecified: Secondary | ICD-10-CM

## 2014-12-21 DIAGNOSIS — Z713 Dietary counseling and surveillance: Secondary | ICD-10-CM | POA: Diagnosis not present

## 2014-12-21 NOTE — Patient Instructions (Addendum)
Goals:  Follow Phase 3B: High Protein + Non-Starchy Vegetables  Eat 3-6 small meals/snacks, every 3-5 hrs  Increase lean protein foods to meet 60g goal  Eat something with protein at least 3 times per day (try to eat something at least every 2 hours)  Cheese, beans, milk, protein shake, beans, nuts  Avoid skipping meals  Continue to have a protein shake if you can't eat and/or if you don't have time to eat  Try a black bean burger (Morningstar brand in the freezer section)  Try different flavors of cheese  Increase fluid intake to 64oz +  Avoid drinking 15 minutes before, during and 30 minutes after eating  Aim for >30 min of physical activity daily  Limit carbs to 15 grams per meal (protein first!)  Try edamame  Try protein shots (if you have them in a smoothie make sure there is no more than 15 grams of carb from fruit)  Surgery date: 07/06/2014 Surgery type: RYGB Start weight at [facility]: 286 lbs on 05/01/14 (highest weight 291 per patient) Weight today: 214.5 lbs Weight change: 6 lbs Total weight loss: 76.5 lbs  Goal: under 200 lbs  TANITA  BODY COMP RESULTS  06/21/14 07/20/14 08/26/14 09/27/14 11/10/14 12/21/14   BMI (kg/m^2) 45.9 42.4 39.8 38.8 35.1 34.1   Fat Mass (lbs) 154.5 143.5 126 110 98 92.5   Fat Free Mass (lbs) 134 123.0 124.5 134 122.5 122   Total Body Water (lbs) 98 90.0 91 98 89.5 89.5

## 2014-12-21 NOTE — Progress Notes (Signed)
  Follow-up visit: 5 months Post-Operative RYGB Surgery  Medical Nutrition Therapy:  Appt start time: 1140 end time: 1205  Primary concerns today: Post-operative Bariatric Surgery Nutrition Management.  Victoria [name] returns having lost another 6 lbs. She states she feels like her weight loss has slowed down. She would like to lose more but family thinks she is too small. She notices that it is much easier to tie shoes, take stairs, crossing legs. Thinks she has developed lactose intolerance, bloating and gassiness with milk and protein shakes. Trying different types of cheese. [name] continues to struggle with taste aversions and finds that she may like a food one day but not the next.  Surgery date: 07/06/2014 Surgery type: RYGB Start weight at [facility]: 286 lbs on 05/01/14 (highest weight 291 per patient) Weight today: 214.5 lbs Weight change: 6 lbs Total weight loss: 76.5 lbs  Goal: under 200 lbs  TANITA  BODY COMP RESULTS  06/21/14 07/20/14 08/26/14 09/27/14 11/10/14 12/21/14   BMI (kg/m^2) 45.9 42.4 39.8 38.8 35.1 34.1   Fat Mass (lbs) 154.5 143.5 126 110 98 92.5   Fat Free Mass (lbs) 134 123.0 124.5 134 122.5 122   Total Body Water (lbs) 98 90.0 91 98 89.5 89.5    Preferred Learning Style:   No preference indicated   Learning Readiness:   Ready  24-hr recall: B (5AM): Premier protein shake OR a few bites of an [place] muffin with scrambled egg and cheese OR 1 cup milk with Froot Loops (10-30g) Snk (8AM): 1.5 oz peanuts and box of raisins (10g) L (PM): 2-3 oz chicken or salmon with a few bites of pasta (14-21g) Snk (PM): Graham crackers and peanut butter OR cheese (5g) D (PM): 1/2 hotdog or chicken wing (7-10g) Snk (PM):   Fluid intake: at least 32-40 oz fluid most days (milk, protein shake, water, water with sugar free flavoring, sugar free twist) Estimated total protein intake: varies, insufficient; 50 grams of protein yesterday  Medications: see list Supplementation: taking liquid  form, sometimes forgets Calcium  Using straws: yes, doesn't feel like like it adds gas Drinking while eating: no Hair loss: no Carbonated beverages: no N/V/D/C: some nausea Dumping syndrome: none  Recent physical activity:  Walking 6,000-8,000 steps per day; recently bought an exercise machine to have at home  Progress Towards Goal(s):  In progress.    Nutritional Diagnosis:  [place]-3.3 Overweight/obesity related to past poor dietary habits and physical inactivity as evidenced by patient w/ recent RYGB surgery following dietary guidelines for continued weight loss.     Intervention:  Nutrition counseling provided.  Teaching Method Utilized:  Visual Auditory Hands on  Barriers to learning/adherence to lifestyle change: taste changes  Demonstrated degree of understanding via:  Teach Back   Monitoring/Evaluation:  Dietary intake, exercise, and body weight. Follow up in 2.5 months for 7 month post-op visit.

## 2014-12-27 ENCOUNTER — Telehealth: Payer: Self-pay | Admitting: Internal Medicine

## 2014-12-27 MED ORDER — LEVOTHYROXINE SODIUM 125 MCG PO TABS: 125.0000 ug | ORAL_TABLET | Freq: Every day | ORAL | Status: DC

## 2014-12-27 NOTE — Telephone Encounter (Signed)
We checked her TSH in June. TSH was low and we changed thyroid replacement 0.125 mg daily. She has been taking that and is now out. We have not received a refill request from pharmacy. She is calling today about a refill. She was to have returned in 6 weeks or so after having dosage decreased to have it followed up. She is status post gastric bypass surgery. Also had low pre-albumin at that time. We are going to refill Synthroid 0.125 mg daily. She has appointment to follow-up with me December 30 which was on the buttock anyway and we can follow-up at that time.

## 2015-01-01 ENCOUNTER — Other Ambulatory Visit: Payer: 59

## 2015-01-27 ENCOUNTER — Other Ambulatory Visit: Payer: Self-pay | Admitting: Internal Medicine

## 2015-02-25 ENCOUNTER — Ambulatory Visit (INDEPENDENT_AMBULATORY_CARE_PROVIDER_SITE_OTHER): Payer: 59 | Admitting: Internal Medicine

## 2015-02-25 ENCOUNTER — Encounter: Payer: 59 | Admitting: Internal Medicine

## 2015-02-25 ENCOUNTER — Encounter: Payer: Self-pay | Admitting: Internal Medicine

## 2015-02-25 VITALS — BP 108/72 | HR 74 | Temp 97.9°F | Resp 20 | Ht 67.0 in | Wt 203.0 lb

## 2015-02-25 DIAGNOSIS — J040 Acute laryngitis: Secondary | ICD-10-CM

## 2015-02-25 DIAGNOSIS — E538 Deficiency of other specified B group vitamins: Secondary | ICD-10-CM | POA: Diagnosis not present

## 2015-02-25 DIAGNOSIS — J069 Acute upper respiratory infection, unspecified: Secondary | ICD-10-CM

## 2015-02-25 DIAGNOSIS — Z9889 Other specified postprocedural states: Secondary | ICD-10-CM | POA: Diagnosis not present

## 2015-02-25 DIAGNOSIS — E039 Hypothyroidism, unspecified: Secondary | ICD-10-CM | POA: Diagnosis not present

## 2015-02-25 DIAGNOSIS — Z1321 Encounter for screening for nutritional disorder: Secondary | ICD-10-CM

## 2015-02-25 DIAGNOSIS — G4700 Insomnia, unspecified: Secondary | ICD-10-CM | POA: Diagnosis not present

## 2015-02-25 DIAGNOSIS — Z9884 Bariatric surgery status: Secondary | ICD-10-CM

## 2015-02-25 DIAGNOSIS — Z0000 Encounter for general adult medical examination without abnormal findings: Secondary | ICD-10-CM | POA: Diagnosis not present

## 2015-02-25 LAB — IRON AND TIBC
%SAT: 25 % (ref 11–50)
Iron: 77 ug/dL (ref 45–160)
TIBC: 306 ug/dL (ref 250–450)
UIBC: 229 ug/dL (ref 125–400)

## 2015-02-25 LAB — LIPID PANEL
Cholesterol: 180 mg/dL (ref 125–200)
HDL: 72 mg/dL (ref >=46)
LDL Cholesterol: 97 mg/dL (ref <130)
Total CHOL/HDL Ratio: 2.5 Ratio (ref <=5.0)
Triglycerides: 55 mg/dL (ref <150)
VLDL: 11 mg/dL (ref <30)

## 2015-02-25 LAB — CBC WITH DIFFERENTIAL/PLATELET
Basophils Absolute: 0 10*3/uL (ref 0.0–0.1)
Basophils Relative: 0 % (ref 0–1)
Eosinophils Absolute: 0.2 10*3/uL (ref 0.0–0.7)
Eosinophils Relative: 3 % (ref 0–5)
HCT: 36.1 % (ref 36.0–46.0)
Hemoglobin: 12.1 g/dL (ref 12.0–15.0)
Lymphocytes Relative: 25 % (ref 12–46)
Lymphs Abs: 1.4 10*3/uL (ref 0.7–4.0)
MCH: 30.9 pg (ref 26.0–34.0)
MCHC: 33.5 g/dL (ref 30.0–36.0)
MCV: 92.3 fL (ref 78.0–100.0)
MPV: 11.6 fL (ref 8.6–12.4)
Monocytes Absolute: 0.4 10*3/uL (ref 0.1–1.0)
Monocytes Relative: 7 % (ref 3–12)
Neutro Abs: 3.6 10*3/uL (ref 1.7–7.7)
Neutrophils Relative %: 65 % (ref 43–77)
Platelets: 222 10*3/uL (ref 150–400)
RBC: 3.91 MIL/uL (ref 3.87–5.11)
RDW: 14.2 % (ref 11.5–15.5)
WBC: 5.5 10*3/uL (ref 4.0–10.5)

## 2015-02-25 LAB — VITAMIN B12: Vitamin B-12: 414 pg/mL (ref 211–911)

## 2015-02-25 LAB — TSH: TSH: 0.012 u[IU]/mL — ABNORMAL LOW (ref 0.350–4.500)

## 2015-02-25 MED ORDER — HYDROCODONE-HOMATROPINE 5-1.5 MG/5ML PO SYRP: 5.0000 mL | ORAL_SOLUTION | Freq: Three times a day (TID) | ORAL | Status: DC | PRN

## 2015-02-25 MED ORDER — METHYLPREDNISOLONE ACETATE 80 MG/ML IJ SUSP
80.0000 mg | Freq: Once | INTRAMUSCULAR | Status: AC
  Administered 2015-02-25: 80 mg via INTRAMUSCULAR

## 2015-02-25 MED ORDER — AZITHROMYCIN 250 MG PO TABS: ORAL_TABLET | ORAL | Status: DC

## 2015-02-25 MED ORDER — ALPRAZOLAM 0.5 MG PO TABS: 0.5000 mg | ORAL_TABLET | Freq: Every evening | ORAL | Status: DC | PRN

## 2015-02-25 NOTE — Progress Notes (Signed)
   Subjective:    Patient ID: Victoria [name], female    DOB: 1960-05-01, 54 y.o.   MRN: [id number]  HPI Patient in today with acute URI symptoms and laryngitis. No fever or shaking chills. Can barely talk and is very hoarse. She has a history of gastric bypass surgery done by Dr. Hassell Done in May. She is to have upcoming physical exam here and wants to get fasting labs drawn today. She has lost 72 pounds since December 2015. Now weighs 203 pounds.  History of hypothyroidism and B-12 deficiency. Gives herself B 12 injections monthly.  Also having issues with insomnia. Wakes up at night and cannot go back to sleep.    Review of Systems     Objective:   Physical Exam Very hoarse when she speaks. TMs are obscured by cerumen. Pharynx slightly injected. Neck supple without adenopathy. Chest clear to auscultation.       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 Zithromax Z-Pak take 2 tablets day one followed by 1 tablet days 2 through 5. Hycodan 1 teaspoon by mouth every 8 hours when necessary cough. Fasting labs for CPE coming up in the near future include CBC with differential, C met, B-12, iron iron-binding capacity, TSH, vitamin D. For insomnia, takes Xanax 0.5 mg #30 one by mouth daily at bedtime when necessary but to take this sparingly. Explained to her the concept of rebound insomnia and she should use Xanax sparingly.

## 2015-02-25 NOTE — Patient Instructions (Signed)
Take Zithromax Z-PAK as directed. Takes Xanax sparingly for insomnia. Return for physical exam January 9. Fasting labs drawn and are pending.

## 2015-02-26 LAB — VITAMIN D 25 HYDROXY (VIT D DEFICIENCY, FRACTURES): Vit D, 25-Hydroxy: 30 ng/mL (ref 30–100)

## 2015-03-01 ENCOUNTER — Telehealth: Payer: Self-pay | Admitting: Internal Medicine

## 2015-03-01 MED ORDER — LEVOFLOXACIN 500 MG PO TABS: 500.0000 mg | ORAL_TABLET | Freq: Every day | ORAL | Status: DC

## 2015-03-01 MED FILL — levoFLOXacin 500 MG TABS: 500 | 10 days supply | Qty: 10 | Fill #0

## 2015-03-01 NOTE — Telephone Encounter (Signed)
Left message on machine with results, advised to call if any questions

## 2015-03-01 NOTE — Telephone Encounter (Signed)
Will change antibiotic to Levaquin 500 mg daily x 10 days. Explain everyone has had this and takes a while to get over.

## 2015-03-01 NOTE — Telephone Encounter (Signed)
Finished her last pill of her Z-Pak today.  Advised patient that it stays in her system for 10 days.  Patient states that she doesn't feel she's any better.  Still coughing her head off.  States that she has a terrible headache.  Laryngitis is worse.  She was able to take off of work today, but is supposed to return tomorrow.    Do you want to see her back?  We gave her shot of Depo Medrol here in the office, but didn't prescribe any steroid for home use.    Please advise.

## 2015-03-07 ENCOUNTER — Ambulatory Visit (INDEPENDENT_AMBULATORY_CARE_PROVIDER_SITE_OTHER): Payer: 59 | Admitting: Internal Medicine

## 2015-03-07 ENCOUNTER — Encounter: Payer: Self-pay | Admitting: Internal Medicine

## 2015-03-07 VITALS — BP 120/64 | HR 68 | Temp 97.6°F | Resp 20 | Ht 67.0 in | Wt 200.0 lb

## 2015-03-07 DIAGNOSIS — Z0000 Encounter for general adult medical examination without abnormal findings: Secondary | ICD-10-CM

## 2015-03-07 DIAGNOSIS — Z9889 Other specified postprocedural states: Secondary | ICD-10-CM

## 2015-03-07 DIAGNOSIS — B9689 Other specified bacterial agents as the cause of diseases classified elsewhere: Secondary | ICD-10-CM

## 2015-03-07 DIAGNOSIS — E039 Hypothyroidism, unspecified: Secondary | ICD-10-CM

## 2015-03-07 DIAGNOSIS — E538 Deficiency of other specified B group vitamins: Secondary | ICD-10-CM | POA: Diagnosis not present

## 2015-03-07 DIAGNOSIS — N760 Acute vaginitis: Secondary | ICD-10-CM | POA: Diagnosis not present

## 2015-03-07 DIAGNOSIS — Z9884 Bariatric surgery status: Secondary | ICD-10-CM

## 2015-03-07 DIAGNOSIS — A499 Bacterial infection, unspecified: Secondary | ICD-10-CM | POA: Diagnosis not present

## 2015-03-07 LAB — POCT URINALYSIS DIPSTICK
Bilirubin, UA: NEGATIVE
Blood, UA: NEGATIVE
Glucose, UA: NEGATIVE
Ketones, UA: NEGATIVE
Leukocytes, UA: NEGATIVE
Nitrite, UA: NEGATIVE
Spec Grav, UA: >=1.03
Urobilinogen, UA: 0.2
pH, UA: 7.5

## 2015-03-07 MED ORDER — METRONIDAZOLE 500 MG PO TABS: 500.0000 mg | ORAL_TABLET | Freq: Two times a day (BID) | ORAL | Status: DC

## 2015-03-07 MED ORDER — LEVOTHYROXINE SODIUM 112 MCG PO CAPS: ORAL_CAPSULE | ORAL | Status: DC

## 2015-03-07 MED FILL — metroNIDAZOLE 500 MG TABS: 500 | 7 days supply | Qty: 14 | Fill #0

## 2015-03-07 MED FILL — LEVOTHYROXINE 112 MCG TAB: 112 | 90 days supply | Qty: 90 | Fill #0

## 2015-03-07 NOTE — Patient Instructions (Addendum)
Change Levothyroxine to 0.112 mg daily and RTC in 8 weeks. Take Flagyl 500 mg twice a day for 7 days. Use vinegar and water douche after taking Flagyl.  Needs 6 month recheck in 6 months. Continue B12 injections monthly at home.

## 2015-03-07 NOTE — Progress Notes (Signed)
Subjective:    Patient ID: Victoria [name], female    DOB: [birth year], 55 y.o.   MRN: [id number]  HPI 55 year old Black Female for health maintenance exam and evaluation of multiple medical issues. 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 Hassell Done. She's done well postoperatively. Now weighs 200 pounds and previously weighed 275 pounds December 2015. History of vitamin D deficiency but vitamin D recently checked was low normal at 30. Needs to take vitamin D supplementation 2000 units vitamin D 3 over-the-counter daily. History of B-12 deficiency and gives herself monthly B 12 injections.  Has noticed a vaginal odor. No significant discharge. History of hypothyroidism. Remote history of hidradenitis supprativa.  She had a thyroidectomy in 1980 and is on thyroid replacement therapy. Cholecystectomy 1987. History of adenomatous colon polyp. Last colonoscopy was in 2009. C-section for twins 43. Bilateral tubal ligation in the 1980s. Total abdominal hysterectomy 2004. Left hip replacement December 2010. Right hip replacement March 2014. Had endoscopy for epigastric pain 2012 by Dr. Olevia Perches. History of H. pylori infection 2001. History of C. difficile infection treated by Dr. Olevia Perches in 2012. Upper endoscopy in 2012 showed chronic duodenitis treated with PPI. History of GE reflux.  Social history: She is divorced and recently remarried. Husband has back issues and is out of work. Does not smoke or consume alcohol. 2 adult twin daughters. 4 grandchildren. Does not have much time for exercise. Works several jobs. 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  Family history: Mother living with history of diabetes and hypertension. Father living with history of diabetes. 2 brothers and one sister. Sister has hypothyroidism.     Review of  Systems chronic musculoskeletal pain, occasional headaches, history of GE reflux     Objective:   Physical Exam  Constitutional: She is oriented to person, place, and time. She appears well-developed and well-nourished. No distress.  HENT:  Head: Normocephalic and atraumatic.  Right Ear: External ear normal.  Left Ear: External ear normal.  Mouth/Throat: Oropharynx is clear and moist. No oropharyngeal exudate.  Eyes: Conjunctivae are normal. Pupils are equal, round, and reactive to light.  Neck: Neck supple. No JVD present. No thyromegaly present.  Cardiovascular: Normal rate, regular rhythm and normal heart sounds.   No murmur heard. Pulmonary/Chest: Effort normal and breath sounds normal. She has no wheezes. She has no rales.  Abdominal: Soft. Bowel sounds are normal. She exhibits no distension and no mass. There is no tenderness. There is no rebound and no guarding.  Genitourinary:  Inspection of vagina reveals no significant discharge. Cuff intact. No masses on bimanual exam  Musculoskeletal: Normal range of motion. She exhibits edema.  Lymphadenopathy:    She has no cervical adenopathy.  Neurological: She is alert and oriented to person, place, and time. She has normal reflexes. No cranial nerve deficit. Coordination normal.  Skin: Skin is warm and dry. No rash noted. She is not diaphoretic.  Psychiatric: She has a normal mood and affect. Her behavior is normal. Judgment and thought content normal.  Vitals reviewed.         Assessment & Plan:  75 pound weight loss after gastric Roux-en-Y bypass surgery May 2016  Hypothyroidism-change dose of levothyroxine and return in 8 weeks. TSH is low. Reduce dose from 0.125-0.112 mg daily  History of  vitamin D deficiency-vitamin D level low normal at 30. Take 2000 units vitamin D 3 daily  B-12 deficiency-gives herself monthly B 12 injections  Bacterial vaginosis-treat with oral Flagyl. Partner may need to be treated.

## 2015-03-08 MED ORDER — PANTOPRAZOLE SODIUM 40 MG PO TBEC: 40.0000 mg | DELAYED_RELEASE_TABLET | Freq: Two times a day (BID) | ORAL | Status: DC

## 2015-03-08 MED FILL — CYCLOBENZAPRINE 10 MG TAB: 10 | 30 days supply | Qty: 30 | Fill #1

## 2015-03-08 MED FILL — PANTOPRAZOLE SOD DR 40 MG T: 40 | 30 days supply | Qty: 60 | Fill #0

## 2015-03-09 ENCOUNTER — Encounter: Payer: Self-pay | Admitting: Dietician

## 2015-03-09 ENCOUNTER — Encounter: Payer: 59 | Attending: Surgery | Admitting: Dietician

## 2015-03-09 DIAGNOSIS — Z6841 Body Mass Index (BMI) 40.0 and over, adult: Secondary | ICD-10-CM | POA: Insufficient documentation

## 2015-03-09 DIAGNOSIS — Z713 Dietary counseling and surveillance: Secondary | ICD-10-CM | POA: Diagnosis not present

## 2015-03-09 NOTE — Patient Instructions (Addendum)
Goals:  Follow Phase 3B: High Protein + Non-Starchy Vegetables  Eat 3-6 small meals/snacks, every 3-5 hrs  Increase lean protein foods to meet 60g goal  Increase fluid intake to 64oz +  Avoid drinking 15 minutes before, during and 30 minutes after eating  Aim for >30 min of physical activity daily  Put together your exercise bike  Limit carbs to 15 grams per meal (protein first!)   Surgery date: 07/06/2014 Surgery type: RYGB [facility] weight at [facility]: 286 lbs on 05/01/14 (highest weight 291 per patient) Weight today: 214.5 lbs Weight change: 14 lbs Total weight loss: 91 lbs  Goal: under 200 lbs  TANITA  BODY COMP RESULTS  06/21/14 07/20/14 08/26/14 09/27/14 11/10/14 12/21/14 03/09/15   BMI (kg/m^2) 45.9 42.4 39.8 38.8 35.1 34.1 31.9   Fat Mass (lbs) 154.5 143.5 126 110 98 92.5 81   Fat Free Mass (lbs) 134 123.0 124.5 134 122.5 122 119.5   Total Body Water (lbs) 98 90.0 91 98 89.5 89.5 87.5

## 2015-03-09 NOTE — Progress Notes (Signed)
  Follow-up visit: 8 months Post-Operative RYGB Surgery  Medical Nutrition Therapy:  Appt start time: [id number] End time: 500  Primary concerns today: Post-operative Bariatric Surgery Nutrition Management.  Victoria [name] returns having lost another 14 lbs. She is excited to weigh under 200 lbs at home. She states she would like to lose a little bit more but would be fine if she stayed at this weight. Her family is concerned that she is "too little." Feels like her "appetite is picking up." Had some treats over the holidays and was satisfied with a small amount. Adding some carbs but having protein with each meal and snack. Some things still do not taste good but continues to try new things. Protein intake has greatly improved!  Surgery date: 07/06/2014 Surgery type: RYGB Start weight at [facility]: 286 lbs on 05/01/14 (highest weight 291 per patient) Weight today: 214.5 lbs Weight change: 14 lbs Total weight loss: 91 lbs  Goal: under 200 lbs  TANITA  BODY COMP RESULTS  06/21/14 07/20/14 08/26/14 09/27/14 11/10/14 12/21/14 03/09/15   BMI (kg/m^2) 45.9 42.4 39.8 38.8 35.1 34.1 31.9   Fat Mass (lbs) 154.5 143.5 126 110 98 92.5 81   Fat Free Mass (lbs) 134 123.0 124.5 134 122.5 122 119.5   Total Body Water (lbs) 98 90.0 91 98 89.5 89.5 87.5    Preferred Learning Style:   No preference indicated   Learning Readiness:   Ready  24-hr recall: B (5AM): Raisin toast and scrambled egg, sometimes with 2 slices bacon ([id number]) Snk (8AM): [place] yogurt (12g) L (PM): 3-4 oz grilled chicken and salad (21-28g)  Snk (PM): 1-2 oz peanuts and small amount of raisins (10g) D (PM): spaghetti with meat sauce OR beef soup with 1/2 grilled cheese sandwich (7-14g) Snk (PM):   Fluid intake: about 48 oz fluid most days (water with sugar free flavoring) Estimated total protein intake: 60-70g  Medications: see list Supplementation: taking liquid form, sometimes forgets Calcium  Using straws: yes, doesn't feel like like it adds  gas Drinking while eating: no Hair loss: some Carbonated beverages: no N/V/D/C: some nausea Dumping syndrome: none  Recent physical activity:  Walking 6,000-8,000 steps per day; recently bought an exercise machine to have at home  Progress Towards Goal(s):  In progress.    Nutritional Diagnosis:  [place]-3.3 Overweight/obesity related to past poor dietary habits and physical inactivity as evidenced by patient w/ recent RYGB surgery following dietary guidelines for continued weight loss.     Intervention:  Nutrition counseling provided.  Teaching Method Utilized:  Visual Auditory Hands on  Barriers to learning/adherence to lifestyle change: taste changes  Demonstrated degree of understanding via:  Teach Back   Monitoring/Evaluation:  Dietary intake, exercise, and body weight. Follow up in 2.5 months for 10 month post-op visit.

## 2015-04-06 ENCOUNTER — Ambulatory Visit (INDEPENDENT_AMBULATORY_CARE_PROVIDER_SITE_OTHER): Payer: 59 | Admitting: Podiatry

## 2015-04-06 ENCOUNTER — Telehealth: Payer: Self-pay | Admitting: Internal Medicine

## 2015-04-06 ENCOUNTER — Encounter: Payer: Self-pay | Admitting: Podiatry

## 2015-04-06 VITALS — BP 121/76 | HR 61 | Resp 16

## 2015-04-06 DIAGNOSIS — M722 Plantar fascial fibromatosis: Secondary | ICD-10-CM | POA: Diagnosis not present

## 2015-04-06 DIAGNOSIS — M2040 Other hammer toe(s) (acquired), unspecified foot: Secondary | ICD-10-CM | POA: Diagnosis not present

## 2015-04-06 MED ORDER — TRIAMCINOLONE ACETONIDE 10 MG/ML IJ SUSP
10.0000 mg | Freq: Once | INTRAMUSCULAR | Status: AC
Start: 2015-04-06 — End: 2015-04-06
  Administered 2015-04-06: 10 mg

## 2015-04-06 NOTE — Progress Notes (Signed)
Subjective:     Patient ID: Marlou Starks, female   DOB: 04/05/1960, 55 y.o.   MRN: [id number]  HPI patient states my heel is still really bothering me more on my left that my right and I know I need arch supports and this toe on my right is simply not gotten better with trimming and padding and I know it needs to be fixed   Review of Systems     Objective:   Physical Exam Neurovascular status intact with intense discomfort plantar heel left with depression of the arch noted and inflammation and fluid upon insertion and palpation to the calcaneal rash insertion. 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 I do think that orthotics are necessary given her type of work she does and instability and continue conservative care for the heel and that given the long-term nature the fifth toe right it would be better corrected with arthroplasty. Patient wants procedure for the right and I allowed her to read consent form reviewed at great length surgery and all risk and she signs consent form after extensive review. For the left I injected the plantar fascia 3 mg Kenalog 5 mg Xylocaine and applied night splint and scanned for custom orthotic devices with instructions

## 2015-04-06 NOTE — Telephone Encounter (Signed)
Patient called to say that she needed new prescription for stair lift mechanism that helps with stair climbing. Apparently it is an elevator type device. Says device that she had previously that we wrote prescription for is in disrepair and she needs new one. Prescription written.

## 2015-04-14 ENCOUNTER — Ambulatory Visit
Admission: RE | Admit: 2015-04-14 | Discharge: 2015-04-14 | Disposition: A | Discharge status: Home / Self Care | Payer: 59 | Source: Ambulatory Visit | Attending: Surgery | Admitting: Surgery

## 2015-04-14 DIAGNOSIS — Z1231 Encounter for screening mammogram for malignant neoplasm of breast: Secondary | ICD-10-CM | POA: Diagnosis not present

## 2015-04-25 ENCOUNTER — Other Ambulatory Visit: Payer: 59 | Admitting: Internal Medicine

## 2015-04-25 DIAGNOSIS — E039 Hypothyroidism, unspecified: Secondary | ICD-10-CM | POA: Diagnosis not present

## 2015-04-26 LAB — TSH: TSH: 0.06 mIU/L — ABNORMAL LOW

## 2015-04-27 ENCOUNTER — Ambulatory Visit (INDEPENDENT_AMBULATORY_CARE_PROVIDER_SITE_OTHER): Payer: 59

## 2015-04-27 DIAGNOSIS — M722 Plantar fascial fibromatosis: Secondary | ICD-10-CM

## 2015-04-27 NOTE — Patient Instructions (Signed)

## 2015-04-27 NOTE — Progress Notes (Signed)
Patient ID: Victoria [name], female   DOB: September 29, 1960, 55 y.o.   MRN: [id number] Patient presents to pick up orthotics. Orthotics dispensed with instructions. Patient is to follow-up with any concerns or complications on an as needed basis.

## 2015-04-28 ENCOUNTER — Telehealth: Payer: Self-pay

## 2015-04-28 MED ORDER — LEVOTHYROXINE SODIUM 50 MCG PO TABS: 50.0000 ug | ORAL_TABLET | Freq: Every day | ORAL | Status: DC

## 2015-04-28 MED FILL — LEVOTHYROXINE 50 MCG TABLET: 50 | 90 days supply | Qty: 90 | Fill #0

## 2015-04-28 NOTE — Telephone Encounter (Signed)
-----   Message from Elby Showers, MD sent at 04/26/2015 12:33 AM EST ----- Reduce Levothyroxine to 0.05 mg daily and RTC in 6 weeks for OV and TSH

## 2015-05-23 ENCOUNTER — Other Ambulatory Visit: Payer: Self-pay | Admitting: Internal Medicine

## 2015-05-23 MED FILL — AMOXICILLIN 250 MG/5 ML SUS: 250 | 14 days supply | Qty: 80 | Fill #1

## 2015-05-23 MED FILL — VALACYCLOVIR HCL 500 MG TAB: 500 | 90 days supply | Qty: 90 | Fill #0

## 2015-05-23 MED FILL — CYANOCOBALAMIN 1,000 MCG/ML: 1000 | 90 days supply | Qty: 3 | Fill #2

## 2015-05-26 ENCOUNTER — Encounter: Payer: Self-pay | Admitting: Internal Medicine

## 2015-05-26 ENCOUNTER — Ambulatory Visit (INDEPENDENT_AMBULATORY_CARE_PROVIDER_SITE_OTHER): Payer: 59 | Admitting: Internal Medicine

## 2015-05-26 ENCOUNTER — Other Ambulatory Visit: Payer: Self-pay | Admitting: Internal Medicine

## 2015-05-26 VITALS — BP 104/68 | HR 68 | Temp 97.4°F | Resp 20 | Wt 199.0 lb

## 2015-05-26 DIAGNOSIS — Z9889 Other specified postprocedural states: Secondary | ICD-10-CM | POA: Diagnosis not present

## 2015-05-26 DIAGNOSIS — R5383 Other fatigue: Secondary | ICD-10-CM

## 2015-05-26 DIAGNOSIS — L02234 Carbuncle of groin: Secondary | ICD-10-CM | POA: Diagnosis not present

## 2015-05-26 DIAGNOSIS — Z9884 Bariatric surgery status: Secondary | ICD-10-CM

## 2015-05-26 DIAGNOSIS — E039 Hypothyroidism, unspecified: Secondary | ICD-10-CM

## 2015-05-26 DIAGNOSIS — E538 Deficiency of other specified B group vitamins: Secondary | ICD-10-CM | POA: Diagnosis not present

## 2015-05-26 LAB — T4, FREE: Free T4: 0.7 ng/dL — ABNORMAL LOW (ref 0.8–1.8)

## 2015-05-26 LAB — TSH: TSH: 11.38 mIU/L — ABNORMAL HIGH

## 2015-05-26 MED ORDER — MUPIROCIN CALCIUM 2 % EX CREA: 1.0000 "application " | TOPICAL_CREAM | Freq: Two times a day (BID) | CUTANEOUS | Status: DC

## 2015-05-26 MED ORDER — DOXYCYCLINE HYCLATE 100 MG PO TABS: 100.0000 mg | ORAL_TABLET | Freq: Two times a day (BID) | ORAL | Status: DC

## 2015-05-26 MED FILL — DOXYCYCLINE HYCLATE 100 MG: 100 | 10 days supply | Qty: 20 | Fill #0

## 2015-05-26 MED FILL — MUPIROCIN 2% OINTMENT: 2 | 14 days supply | Qty: 22 | Fill #0

## 2015-05-26 NOTE — Progress Notes (Signed)
   Subjective:    Patient ID: Victoria [name], female    DOB: 1960-07-20, 55 y.o.   MRN: [id number]  HPI  History of hidradenitis suppurativa requiring surgery by Dr. Kaylyn Lim several years ago. Now has carbuncle right upper leg/groin area. It's been present for some time. Sometimes it drains purulent material. Has felt very fatigued and irritable recently. We have decreased her thyroid replacement 0.05 mg daily. We had planned to repeat TSH around April 10 but since she's not feeling well we'll go ahead and do that today. She does have some mild exophthalmus. Has never seen an endocrinologist. Has had thyroid surgery consisting of partial thyroidectomy in the remote past rendering her hypothyroid. With regard to weight loss she's actually plateaued. Weighs 199 pounds and at last visit weight 200 pounds. She is status post gastric bypass surgery.    Review of Systems     Objective:   Physical Exam  Quarter sized lesion right upper medial thigh/groin area that is draining purulent material. Mild exophthalmus. Free T4 and TSH drawn and pending..      Assessment & Plan:  Fatigue  History of hypothyroidism  Carbuncle right groin  Plan: Doxycycline 100 mg twice daily for 10 days. Soak in warm soapy water for 20 minutes daily. Use Hibiclens to bathe and for several weeks. Bactroban ointment in nostrils at least once daily. If lesion does not improve will need to have incision and drainage.

## 2015-05-26 NOTE — Patient Instructions (Signed)
May need I and D of carbuncle by surgeon. Repeat thyroid functions today. Take Doxycycline as prescribed. Uses bactroban in nostrils

## 2015-05-30 LAB — VITAMIN B12: Vitamin B-12: 1296 pg/mL — ABNORMAL HIGH (ref 200–1100)

## 2015-06-06 ENCOUNTER — Other Ambulatory Visit: Payer: 59 | Admitting: Internal Medicine

## 2015-06-14 DIAGNOSIS — H5213 Myopia, bilateral: Secondary | ICD-10-CM | POA: Diagnosis not present

## 2015-06-24 ENCOUNTER — Encounter: Payer: 59 | Attending: Internal Medicine | Admitting: Dietician

## 2015-06-24 ENCOUNTER — Other Ambulatory Visit: Payer: Self-pay

## 2015-06-24 ENCOUNTER — Encounter: Payer: Self-pay | Admitting: Dietician

## 2015-06-24 DIAGNOSIS — M549 Dorsalgia, unspecified: Secondary | ICD-10-CM | POA: Diagnosis not present

## 2015-06-24 DIAGNOSIS — K219 Gastro-esophageal reflux disease without esophagitis: Secondary | ICD-10-CM | POA: Insufficient documentation

## 2015-06-24 DIAGNOSIS — M1990 Unspecified osteoarthritis, unspecified site: Secondary | ICD-10-CM | POA: Diagnosis not present

## 2015-06-24 DIAGNOSIS — Z6831 Body mass index (BMI) 31.0-31.9, adult: Secondary | ICD-10-CM | POA: Insufficient documentation

## 2015-06-24 DIAGNOSIS — Z79899 Other long term (current) drug therapy: Secondary | ICD-10-CM | POA: Insufficient documentation

## 2015-06-24 MED ORDER — LEVOTHYROXINE SODIUM 50 MCG PO TABS: 50.0000 ug | ORAL_TABLET | Freq: Every day | ORAL | Status: DC

## 2015-06-24 NOTE — Patient Instructions (Addendum)
Goals:  Follow Phase 3B: High Protein + Non-Starchy Vegetables  Eat 3-6 small meals/snacks, every 3-5 hrs  Keep meeting 60g protein goal  Increase fluid intake to 64oz +  Avoid drinking 15 minutes before, during and 30 minutes after eating  Continue to work on balance (use your tool and listen to your body)  Work on stress management (think about learning to knit/crochet/needlepoint)  Keep doing exercises that you enjoy  Try Bariatric Advantage Calcium chews at work   Think about non scale victories: "normal" clothing sizes, crossing legs, tying shoes, getting up off the floor    Surgery date: 07/06/2014 Surgery type: RYGB Start weight at [facility]: 286 lbs on 05/01/14 (highest weight 291 per patient) Weight today: 196.5 lbs Weight change: 4 lbs Total weight loss: 95 lbs  TANITA  BODY COMP RESULTS  06/21/14 07/20/14 08/26/14 09/27/14 11/10/14 12/21/14 03/09/15 06/24/15   BMI (kg/m^2) 45.9 42.4 39.8 38.8 35.1 34.1 31.9 31.2   Fat Mass (lbs) 154.5 143.5 126 110 98 92.5 81 81.5   Fat Free Mass (lbs) 134 123.0 124.5 134 122.5 122 119.5 115   Total Body Water (lbs) 98 90.0 91 98 89.5 89.5 87.5 84

## 2015-06-24 NOTE — Progress Notes (Signed)
Follow-up visit: 11 months Post-Operative RYGB Surgery  Medical Nutrition Therapy:  Appt start time: [id number] End time: 850  Primary concerns today: Post-operative Bariatric Surgery Nutrition Management.  Victoria [name] returns having lost another 4 lbs. She is excited to be under 200 lbs. Thyroid medication is being managed. Feels like she would like to be 180 lbs but does "not want to look sick." Still drinks protein shakes (1 premier per day) and 2 Dannon Greek yogurts per day. She still feels like she needs to eat about every 2 hours. Taste aversions are resolving. Adding some carbohydrates but meeting protein  needs. Has a lot of family stress and finds herself stress eating.    Samples provided and patient instructed on proper use:  Bariatric Advantage Calcium citrate chew (caramel - qty 2) Lot#: [id number] Exp: 12/2015  Bariatric Advantage Calcium citrate chew (peanut butter chocolate - qty 2) Lot#: [id number] Exp: 01/2016  Bariatric Advantage Calcium citrate chew (chocolate - qty 2) Lot#: [id number] Exp: 12/2015  Bariatric Advantage Calcium citrate chew (strawberry - qty 2) Lot#: [id number] Exp: 01/2016  Bariatric Advantage Calcium citrate chew (coconut - qty 2) Lot#: [id number] Exp: 08/2015  Bariatric Advantage Calcium citrate chew (orange - qty 2) Lot#: [id number] Exp: 09/2015   Surgery date: 07/06/2014 Surgery type: RYGB Start weight at [facility]: 286 lbs on 05/01/14 (highest weight 291 per patient) Weight today: 196.5 lbs Weight change: 4 lbs Total weight loss: 95 lbs  Goal: 180 lbs  TANITA  BODY COMP RESULTS  06/21/14 07/20/14 08/26/14 09/27/14 11/10/14 12/21/14 03/09/15 06/24/15   BMI (kg/m^2) 45.9 42.4 39.8 38.8 35.1 34.1 31.9 31.2   Fat Mass (lbs) 154.5 143.5 126 110 98 92.5 81 81.5   Fat Free Mass (lbs) 134 123.0 124.5 134 122.5 122 119.5 115   Total Body Water (lbs) 98 90.0 91 98 89.5 89.5 87.5 84    Preferred Learning Style:   No preference indicated   Learning Readiness:    Ready  24-hr recall: Wakes up around 4am and gets to work around 5am B (5AM): Premier protein shake on days she works OR scrambled egg, sometimes with 2 slices bacon OR [place] sandwich on wheat (10-30g) Snk (8AM): Dannon [place] yogurt with crunch (12g) L (PM): 3-4 oz grilled chicken and salad OR yogurt (21-28g)  Snk (PM): Protein shake if she did not have one with breakfast D (PM): spaghetti with meat sauce (7-14g) Snk (PM):   Fluid intake: about 30-46 oz fluid most days (water with sugar free flavoring), 11 oz protein shake (feels like she's getting 58-60 oz total) Estimated total protein intake: at least 60 grams per patient  Medications: see list Supplementation: taking liquid form, sometimes forgets Calcium  Using straws: yes, doesn't feel like like it adds gas Drinking while eating: no Hair loss: some Carbonated beverages: no N/V/D/C: some nausea Dumping syndrome: none  Recent physical activity:  Yard work (raking leaves and push mowing), walking in park when it's nice for about 30 minutes  Progress Towards Goal(s):  In progress.    Nutritional Diagnosis:  [place]-3.3 Overweight/obesity related to past poor dietary habits and physical inactivity as evidenced by patient w/ recent RYGB surgery following dietary guidelines for continued weight loss.     Intervention:  Nutrition counseling provided.  Teaching Method Utilized:  Visual Auditory Hands on  Barriers to learning/adherence to lifestyle change: taste changes  Demonstrated degree of understanding via:  Teach Back   Monitoring/Evaluation:  Dietary intake, exercise, and body weight. Follow up in 3 months  for 14 month post-op visit.

## 2015-06-27 MED FILL — LEVOTHYROXINE 50 MCG TABLET: 50 | 30 days supply | Qty: 30 | Fill #0

## 2015-07-05 ENCOUNTER — Other Ambulatory Visit: Payer: 59 | Admitting: Internal Medicine

## 2015-07-05 DIAGNOSIS — E039 Hypothyroidism, unspecified: Secondary | ICD-10-CM | POA: Diagnosis not present

## 2015-07-05 DIAGNOSIS — H40013 Open angle with borderline findings, low risk, bilateral: Secondary | ICD-10-CM | POA: Diagnosis not present

## 2015-07-05 LAB — TSH: TSH: 2.82 mIU/L

## 2015-07-06 ENCOUNTER — Telehealth: Payer: Self-pay

## 2015-07-06 ENCOUNTER — Other Ambulatory Visit: Payer: Self-pay

## 2015-07-06 MED ORDER — LEVOTHYROXINE SODIUM 100 MCG PO TABS: 100.0000 ug | ORAL_TABLET | Freq: Every day | ORAL | Status: DC

## 2015-07-06 MED FILL — LEVOTHYROXINE 100 MCG TAB: 100 | 90 days supply | Qty: 90 | Fill #0

## 2015-07-06 NOTE — Telephone Encounter (Signed)
Pt notified and new script for levothyroxine 177mcg sent to pharmacy

## 2015-07-11 ENCOUNTER — Ambulatory Visit: Payer: 59 | Admitting: Internal Medicine

## 2015-07-26 ENCOUNTER — Telehealth: Payer: Self-pay | Admitting: Internal Medicine

## 2015-07-26 NOTE — Telephone Encounter (Signed)
Patient calls with complaints of hot flashes.  States that she is not currently taking anything for hormones.  Has stopped the Estradiol.  Wants to know if you want to see her in the office or if you want to re-start that medication.  She is having hot flashes FAST and furious.  States at times she feels like she is going to pass out during these hot flashes.  They sometimes last 2 minutes in duration.  States that they are even waking her up in the middle of the night.  She has them all throughout the day.  Patient just feels she needs some immediate relief.    Please advise.  And, please call her on her # [phone]  Pharmacy:  [place]

## 2015-07-26 NOTE — Telephone Encounter (Signed)
Will refill Estradiol for 3 months but she needs to go to GYN of choice to discuss continuing this.

## 2015-07-27 MED ORDER — ESTRADIOL 1 MG PO TABS: 1.0000 mg | ORAL_TABLET | Freq: Every day | ORAL | Status: DC

## 2015-07-27 MED FILL — ESTRADIOL 1 MG TABLET: 1 | 90 days supply | Qty: 90 | Fill #0

## 2015-07-27 NOTE — Telephone Encounter (Signed)
Patient called back; advised that we will provide 3 months of Estradiol for her.  She does need to choose a GYN and establish and see them regarding the current issues she's having with hot flashes.  Patient is going to call Dr. Hal Hope Smith's office.  Provided her with her [phone].  She'll call and set up appointment.  Advised her medication has been sent to [facility].  She has 2 refills after today's Rx.  Patient verbalized understanding of instructions from Dr. Verlene Mayer message.

## 2015-07-27 NOTE — Telephone Encounter (Signed)
Medication sent to pharmacy with a message to have patient contact us.

## 2015-07-27 NOTE — Telephone Encounter (Signed)
Attempted to reach patient on her "watch" phone @ [phone]; no voice mail available.  Will have DeAnna send Rx to [facility].  Will continue to reach out to patient for return call.

## 2015-08-08 ENCOUNTER — Ambulatory Visit (INDEPENDENT_AMBULATORY_CARE_PROVIDER_SITE_OTHER): Payer: 59 | Admitting: Obstetrics & Gynecology

## 2015-08-08 ENCOUNTER — Encounter: Payer: Self-pay | Admitting: Obstetrics & Gynecology

## 2015-08-08 ENCOUNTER — Encounter: Payer: 59 | Admitting: Obstetrics & Gynecology

## 2015-08-08 VITALS — BP 98/64 | HR 70 | Resp 12 | Ht 67.0 in | Wt 196.2 lb

## 2015-08-08 DIAGNOSIS — Z205 Contact with and (suspected) exposure to viral hepatitis: Secondary | ICD-10-CM

## 2015-08-08 DIAGNOSIS — N951 Menopausal and female climacteric states: Secondary | ICD-10-CM

## 2015-08-08 DIAGNOSIS — Z79890 Hormone replacement therapy (postmenopausal): Secondary | ICD-10-CM | POA: Diagnosis not present

## 2015-08-08 DIAGNOSIS — Z0000 Encounter for general adult medical examination without abnormal findings: Secondary | ICD-10-CM

## 2015-08-08 DIAGNOSIS — Z20828 Contact with and (suspected) exposure to other viral communicable diseases: Secondary | ICD-10-CM | POA: Diagnosis not present

## 2015-08-08 DIAGNOSIS — R232 Flushing: Secondary | ICD-10-CM

## 2015-08-08 LAB — POCT URINALYSIS DIPSTICK
Bilirubin, UA: NEGATIVE
Glucose, UA: NEGATIVE
Ketones, UA: NEGATIVE
Leukocytes, UA: NEGATIVE
Nitrite, UA: NEGATIVE
Protein, UA: NEGATIVE
Urobilinogen, UA: NEGATIVE
pH, UA: 5

## 2015-08-08 MED ORDER — ESTRADIOL 0.1 MG/24HR TD PTTW: 1.0000 | MEDICATED_PATCH | TRANSDERMAL | Status: DC

## 2015-08-08 MED FILL — ESTRADIOL 0.1 MG PATCH: 0.1 | 28 days supply | Qty: 8 | Fill #0

## 2015-08-08 NOTE — Progress Notes (Signed)
55 y.o. [id number] (twins) MarriedAfrican AmericanF here for annual exam/new patient appointment.  Pt reports she went into menopause about a year ago.  Pt thinks this happened around the time of her gastric bypass.  Weight approached 300# before her surgery.  She weight #196 today.  She's having issues with hot flashes and night sweats.  The night sweats are really interrupting her sleep.  During the day when she has a hot flash, she feels it is severe and lasts a few minutes and she feels so drained after one of them occur.  Has had recent thyroid, iron, and glucose testing.  Pt started estrace about two to three weeks ago with Dr. Renold Genta.  Doesn't think this is doing anything.  Taking 1mg  daily.  H/O pernicious anemia.  H/o some malabsorption that occurred after the bypass surgery.  D/W pt possible switch to transdermal method may be useful.  No LMP recorded. Patient has had a hysterectomy.          Sexually active: Yes.    The current method of family planning is status post hysterectomy.    Exercising: No.  The patient does not participate in regular exercise at present. Smoker:  no  Health Maintenance: Pap:  Pt states 2015 (?) History of abnormal Pap:  no MMG:  04/18/2015 BIRADS 1 negative Colonoscopy:  10/17/2007 polyp, pt states she needs to schedule BMD:   08/17/2008  TDaP:  01/10/2012  Pneumonia vaccine(s):  never Zostavax:   never Hep C testing: drawn today  Screening Labs: PCP, Hb today: PCP, Urine today: trace RBC's   reports that she has never smoked. She has never used smokeless tobacco. She reports that she does not drink alcohol or use illicit drugs.  Past Medical History  Diagnosis Date  . Hx of adenomatous colonic polyps 10/17/07  . Osteoarthritis     left hip  . Hydradenitis   . GERD (gastroesophageal reflux disease)   . Hypothyroidism   . Pernicious anemia   . Vitamin D deficiency   . HSV-2 infection   . Obesity   . Hiatal hernia   . C. difficile diarrhea 10/2010   . Hemorrhoids   . Varicose veins   . Headache     HX MIGRAINES  . History of transfusion   . Complication of anesthesia     HAD DIFFICULTY SWOLLING SOLID FOOD AFTER INTUBATION FOR PAST SURG  . PONV (postoperative nausea and vomiting)     Past Surgical History  Procedure Laterality Date  . Hip arthroplasty  01/31/09    left  . Thyroidectomy  1980  . Refractive surgery    . Hsv 2    . Cesarean section  1985  . Cholecystectomy  1987  . Abdominal hysterectomy  2004    partial  . Total hip arthroplasty Right 05/21/2012    Procedure: TOTAL HIP ARTHROPLASTY;  Surgeon: Gearlean Alf, MD;  Location: [facility];  Service: Orthopedics;  Laterality: Right;  . Breath tek h pylori N/A 05/03/2014    Procedure: BREATH TEK H PYLORI;  Surgeon: Kaylyn Lim, MD;  Location: [facility];  Service: General;  Laterality: N/A;  . Joint replacement  01/2009    l hip (BILATERAL TOTAL HIPS)  . Hammer toe surgery    . Gastric roux-en-y N/A 07/06/2014    Procedure: LAPAROSCOPIC ROUX-EN-Y GASTRIC BYPASS, ENTEROLYSIS OF ADHESIONS WITH UPPER ENDOSCOPY;  Surgeon: Johnathan Hausen, MD;  Location: [facility];  Service: General;  Laterality: N/A;  . Hiatal hernia repair N/A  07/06/2014    Procedure: LAPAROSCOPIC REPAIR OF HIATAL HERNIA;  Surgeon: Johnathan Hausen, MD;  Location: [facility];  Service: General;  Laterality: N/A;    Current Outpatient Prescriptions  Medication Sig Dispense Refill  . ALPRAZolam (XANAX) 0.5 MG tablet Take 1 tablet (0.5 mg total) by mouth at bedtime as needed for anxiety. 30 tablet 0  . AMOXICILLIN PO Take 2,000 mg by mouth. Prior to dental visits    . Calcium Carbonate-Vitamin D (CALCIUM-VITAMIN D3 PO) Take 15 mLs by mouth.    . cyanocobalamin (,VITAMIN B-12,) 1000 MCG/ML injection INJECT 1 ML INTRAMUSCULARLY EVERY MONTH. Include needles and syringes for one year. 25 mL 0  . cyclobenzaprine (FLEXERIL) 10 MG tablet TAKE 1 TABLET BY MOUTH AT BEDTIME 30 tablet 3  . estradiol (ESTRACE) 1 MG tablet Take 1  tablet (1 mg total) by mouth daily. 90 tablet 0  . levothyroxine (SYNTHROID, LEVOTHROID) 100 MCG tablet Take 1 tablet (100 mcg total) by mouth daily. 90 tablet 1  . Multiple Vitamins-Minerals (MULTIVITAMIN PO) Take 30 mLs by mouth.    . pantoprazole (PROTONIX) 40 MG tablet Take 1 tablet (40 mg total) by mouth 2 (two) times daily. 60 tablet 11  . promethazine (PHENERGAN) 25 MG tablet Take 1 tablet (25 mg total) by mouth every 8 (eight) hours as needed for nausea or vomiting. 30 tablet 1  . valACYclovir (VALTREX) 500 MG tablet TAKE 1 TABLET BY MOUTH ONCE DAILY 90 tablet 3  . mupirocin cream (BACTROBAN) 2 % Apply 1 application topically 2 (two) times daily. (Patient not taking: Reported on 08/08/2015) 15 g 0  . SUMAtriptan (IMITREX) 100 MG tablet One po at onset of migraine (Patient not taking: Reported on 08/08/2015) 10 tablet 2   No current facility-administered medications for this visit.    Family History  Problem Relation Age of Onset  . Diabetes Father   . Diabetes Sister   . Hypertension Sister   . Hypertension Mother     ROS:  Pertinent items are noted in HPI.  Otherwise, a comprehensive ROS was negative.  Exam:   BP 98/64 mmHg  Pulse 70  Resp 12  Ht 5\' 7"  (1.702 m)  Wt 196 lb 3.2 oz (88.996 kg)  BMI 30.72 kg/m2   Height: 5\' 7"  (170.2 cm)  Ht Readings from Last 3 Encounters:  08/08/15 5\' 7"  (1.702 m)  06/24/15 5' 6.5" (1.689 m)  03/09/15 5' 6.5" (1.689 m)   Physical Exam  Constitutional: She is oriented to person, place, and time. She appears well-developed and well-nourished.  Neurological: She is alert and oriented to person, place, and time.  Skin: Skin is warm and dry.  Psychiatric: She has a normal mood and affect.   Pt had full exam 1/17 with Dr. Renold Genta.  Declines repeat breast exam today.  A:  Hot flashes and vasomotor symptoms About 100 pounds of weight loss after gastric bypass surgery Hypothyroidism H/O Vit D def H/o pernicious anemia, on b12 H/O C diff  infection  P:   Mammogram screening recommended yearly, esp if she is going to be on HRT Discussed with patient risks and benefits and specifically the WHI study including but not limited to risks of increased risks of heart disease, MI, stroke, DVT, and breast cancer.  Increased risks of gall bladder disease and change in cholesterol panels also discussed.  Possibility of bleeding was not discussed as patient does not have a uterus.  Benefits of improved quality of life, improved bone density and decreased  risks of colon cancer also discussed.  NO DVT HX.  Will change to Vivelle dot 0.1mg  patches twice weekly.  #8/2RF to pharmacy on file Pt will give update in 3 weeks.  Plan follow-up 4-6 weeks if needed. Pt's symptoms of feeling fatigue/exhaustion after a hot flash during the day is atypical and I discussed this with her.  With recent thyroid, glucose, and iron level testing, do not feel additional blood work is helpful at this time.  I wonder if she is having some hypoglycemic episodes.  Husband has a glucometer and she is going to try and test her blood sugar with one of these episodes, so I can be sure hypoglycemia is not also contributing.   Pap smear guidelines discussed with pt.  Due to hysterectomy for benign reasons and no hx of abnormal pap smear previously, no yearly pap smears recommended.  Voiced understanding. Hep C testing will be obtained today as well as FSH. return annually or prn  ~30 minutes spent with patient >50% of time was in face to face discussion of above.

## 2015-08-09 ENCOUNTER — Other Ambulatory Visit: Payer: Self-pay | Admitting: Internal Medicine

## 2015-08-09 LAB — HEPATITIS C ANTIBODY: HCV Ab: NEGATIVE

## 2015-08-09 LAB — FOLLICLE STIMULATING HORMONE: FSH: 43.6 m[IU]/mL

## 2015-08-09 MED FILL — CYCLOBENZAPRINE 10 MG TAB: 10 | 30 days supply | Qty: 30 | Fill #2

## 2015-08-09 MED FILL — PANTOPRAZOLE SOD DR 40 MG T: 40 | 30 days supply | Qty: 60 | Fill #1

## 2015-08-09 NOTE — Telephone Encounter (Signed)
Refill x 6 months please

## 2015-08-10 MED FILL — ALPRAZolam 0.5 MG TABS: 0.5 | 30 days supply | Qty: 30 | Fill #0

## 2015-08-10 NOTE — Telephone Encounter (Signed)
Phone to [employer] long

## 2015-08-15 ENCOUNTER — Telehealth: Payer: Self-pay | Admitting: *Deleted

## 2015-08-15 MED FILL — CYANOCOBALAMIN 1,000 MCG/ML: 1000 | 90 days supply | Qty: 3 | Fill #3

## 2015-08-15 NOTE — Telephone Encounter (Signed)
Returned call to patient on work number per patient request. Patient unavailable to speak with RN at this time asked receptionist to have patient call office back.

## 2015-08-15 NOTE — Telephone Encounter (Signed)
-----   Message from Megan Salon, MD sent at 08/09/2015  1:03 PM EDT ----- Notified pt via myChart of results.  Mrs. Victoria [name], Your hepatitis C testing was negative.  Your [place] is elevated in a range that I would expect considering the symptoms you are having.  So, you're going to start the patch and give me an update in two weeks.  Please let me know if you have any questions.  Dr. Sabra Heck

## 2015-08-15 NOTE — Telephone Encounter (Signed)
Patient returning call. Can reach her at [phone].

## 2015-08-15 NOTE — Telephone Encounter (Signed)
Patient not yet reviewed results via [place]. Left message for patient to review mychart or call office to review lab results with RN.

## 2015-08-15 NOTE — Telephone Encounter (Signed)
Patient returned called and stated she has been unable to access her MyChart from home. Patient given lab results and verbalized understanding. Patient stated she started the patch about a week ago, so RN stated she would need to give Dr. Sabra Heck an update next week. Verbalized understanding. Will call office with any questions.   Will close encounter.

## 2015-08-23 ENCOUNTER — Telehealth: Payer: Self-pay | Admitting: Internal Medicine

## 2015-08-23 MED ORDER — HYDROCODONE-ACETAMINOPHEN 10-325 MG PO TABS: 1.0000 | ORAL_TABLET | Freq: Three times a day (TID) | ORAL | Status: DC | PRN

## 2015-08-23 MED FILL — HYDROCODON-APAP 10-325: 10-325 | 20 days supply | Qty: 60 | Fill #0

## 2015-08-23 NOTE — Telephone Encounter (Signed)
Rx for Norco 10/325 #60 one po bid with food. Pt has to pick up. Will not continue this Rx-only temporary Rx

## 2015-08-23 NOTE — Telephone Encounter (Signed)
Patient states she's having Right Hip pain.  C/O throbbing pain that has been worsening over the last several weeks.  She has an appointment with Dr. Maureen Ralphs on 7/7.  And, she has been taking Tylenol for the pain.  However, states that the pain is worsening and she doesn't think she can make it with the Tylenol until 7/7.  States that the pain is waking her up in the night.  Last night, she woke up 3 times with throbbing pain.  States she's at work today, but wants to know if you will give her something for the pain to get her through until she's able to see Dr. Maureen Ralphs on 7/7 (that's the first available time he could see her)?    She's willing to come in and see you if that's what you require to obtain pain medication, she just wants to have something to relieve the pain and the constant throbbing.    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    Thank you.

## 2015-08-23 NOTE — Telephone Encounter (Signed)
Patient notified

## 2015-08-25 ENCOUNTER — Telehealth: Payer: Self-pay | Admitting: Obstetrics & Gynecology

## 2015-08-25 NOTE — Telephone Encounter (Signed)
Spoke with patient. Patient states that she is currently using Vivelle Dot 0.1 mg biweekly patches. Reports she is still experiencing moments where she becomes sweaty, shaky, and losses energy. Reports this was occuring prior to starting the Vivelle Dot. Reports since starting Vivelle Dot this has occurred twice. Denies any correlation with removing or replacing a new patch. "Dr.Miller told me to give her an update in case I needed a higher dosage." Advised I will speak with Dr.Miller and return call with further recommendations. She is agreeable.

## 2015-08-25 NOTE — Telephone Encounter (Signed)
Patient is calling to give an update to Dr. Ammie Ferrier nurse about the patch she is on.

## 2015-08-25 NOTE — Telephone Encounter (Signed)
I think she needs a repeat OV and I want to check a BMP when she comes.

## 2015-08-26 NOTE — Telephone Encounter (Signed)
Patient called back

## 2015-08-26 NOTE — Telephone Encounter (Signed)
Left message to call Kaitlyn at [phone]. 

## 2015-08-26 NOTE — Telephone Encounter (Signed)
Spoke with patient. Advised of message as seen below form Dr.Miller. She is agreeable and verbalizes understanding.  Appointment scheduled for 09/09/2015 at 3:30 pm. She is agreeable to date and time.  Routing to provider for final review. Patient agreeable to disposition. Will close encounter.

## 2015-09-01 MED FILL — ESTRADIOL 0.1 MG PATCH: 0.1 | 28 days supply | Qty: 8 | Fill #1

## 2015-09-02 DIAGNOSIS — M479 Spondylosis, unspecified: Secondary | ICD-10-CM

## 2015-09-02 DIAGNOSIS — Z471 Aftercare following joint replacement surgery: Secondary | ICD-10-CM | POA: Diagnosis not present

## 2015-09-02 DIAGNOSIS — M25652 Stiffness of left hip, not elsewhere classified: Secondary | ICD-10-CM | POA: Diagnosis not present

## 2015-09-02 DIAGNOSIS — Z96642 Presence of left artificial hip joint: Secondary | ICD-10-CM | POA: Diagnosis not present

## 2015-09-02 HISTORY — DX: Spondylosis, unspecified: M47.9

## 2015-09-02 MED FILL — METHYLPREDNISOLONE 4 MG TAB: 4 | 6 days supply | Qty: 21 | Fill #0

## 2015-09-09 ENCOUNTER — Ambulatory Visit (INDEPENDENT_AMBULATORY_CARE_PROVIDER_SITE_OTHER): Payer: 59 | Admitting: Obstetrics & Gynecology

## 2015-09-09 ENCOUNTER — Encounter: Payer: Self-pay | Admitting: Obstetrics & Gynecology

## 2015-09-09 ENCOUNTER — Other Ambulatory Visit: Payer: Self-pay | Admitting: Obstetrics & Gynecology

## 2015-09-09 ENCOUNTER — Encounter: Payer: 59 | Attending: Internal Medicine | Admitting: Dietician

## 2015-09-09 ENCOUNTER — Encounter: Payer: Self-pay | Admitting: Dietician

## 2015-09-09 VITALS — BP 92/54 | HR 80 | Resp 14 | Ht 67.0 in | Wt 197.8 lb

## 2015-09-09 DIAGNOSIS — Z79890 Hormone replacement therapy (postmenopausal): Secondary | ICD-10-CM | POA: Diagnosis not present

## 2015-09-09 DIAGNOSIS — K219 Gastro-esophageal reflux disease without esophagitis: Secondary | ICD-10-CM | POA: Insufficient documentation

## 2015-09-09 DIAGNOSIS — Z6831 Body mass index (BMI) 31.0-31.9, adult: Secondary | ICD-10-CM | POA: Insufficient documentation

## 2015-09-09 DIAGNOSIS — R5382 Chronic fatigue, unspecified: Secondary | ICD-10-CM | POA: Diagnosis not present

## 2015-09-09 DIAGNOSIS — M549 Dorsalgia, unspecified: Secondary | ICD-10-CM | POA: Insufficient documentation

## 2015-09-09 DIAGNOSIS — M1990 Unspecified osteoarthritis, unspecified site: Secondary | ICD-10-CM | POA: Insufficient documentation

## 2015-09-09 DIAGNOSIS — Z79899 Other long term (current) drug therapy: Secondary | ICD-10-CM | POA: Insufficient documentation

## 2015-09-09 LAB — COMPREHENSIVE METABOLIC PANEL
ALT: 48 U/L — ABNORMAL HIGH (ref 6–29)
AST: 34 U/L (ref 10–35)
Albumin: 3.5 g/dL — ABNORMAL LOW (ref 3.6–5.1)
Alkaline Phosphatase: 61 U/L (ref 33–130)
BUN: 25 mg/dL (ref 7–25)
CO2: 26 mmol/L (ref 20–31)
Calcium: 8.9 mg/dL (ref 8.6–10.4)
Chloride: 101 mmol/L (ref 98–110)
Creat: 0.81 mg/dL (ref 0.50–1.05)
Glucose, Bld: 68 mg/dL (ref 65–99)
Potassium: 4.4 mmol/L (ref 3.5–5.3)
Sodium: 139 mmol/L (ref 135–146)
Total Bilirubin: 0.3 mg/dL (ref 0.2–1.2)
Total Protein: 7.1 g/dL (ref 6.1–8.1)

## 2015-09-09 LAB — TSH: TSH: 2.7 mIU/L

## 2015-09-09 MED ORDER — ESTRADIOL 0.1 MG/24HR TD PTTW: 1.0000 | MEDICATED_PATCH | TRANSDERMAL | Status: DC

## 2015-09-09 MED ORDER — CLONIDINE HCL 0.1 MG PO TABS: 0.1000 mg | ORAL_TABLET | Freq: Two times a day (BID) | ORAL | Status: DC

## 2015-09-09 MED FILL — cloNIDine HCL 0.1 MG TABS: 0.1 | 30 days supply | Qty: 60 | Fill #0

## 2015-09-09 NOTE — Patient Instructions (Addendum)
Goals:  Follow Phase 3B: High Protein + Non-Starchy Vegetables  Eat 3-6 small meals/snacks, every 3-5 hrs  Keep meeting 60g protein goal  Increase fluid intake to 64oz +  Avoid drinking 15 minutes before, during and 30 minutes after eating  Continue to work on balance (use your tool and listen to your body)  Work on Child psychotherapist (think about learning to knit/crochet/needlepoint)  Try Bariat[employer] Calcium chews at work   Think about ways to increase exercise on off days (physical therapy, water aerobics)  Try [employer] protein chips or parmesan whisps   Limit fruit to 1-2 servings per day (add a second serving on days you exercise)  Try eating a more substantial breakfast to help hold you longer and have a protein shake for morning snack  Boiled eggs, [employer]  Keep a variety of options available at work   Think about non scale victories: "normal" clothing sizes, crossing legs, tying shoes, getting up off the floor

## 2015-09-09 NOTE — Progress Notes (Signed)
  Follow-up visit: 14 months Post-Operative RYGB Surgery  Medical Nutrition Therapy:  Appt start time: 910 End time: 950  Primary concerns today: Post-operative Bariatric Surgery Nutrition Management.  [name] returns having maintained her weight. She is frustrated with lack of weight loss and would still like to lose more. She has just finished a round of steroids for back injury. Has added watermelon.    Surgery date: 07/06/2014 Surgery type: RYGB Start weight at [facility]: 286 lbs on 05/01/14 (highest weight 291 per patient) Weight today: 196.5 lbs Weight change: 0 lbs Total weight loss: 95 lbs  Goal: 180 lbs  TANITA  BODY COMP RESULTS  06/21/14 07/20/14 08/26/14 09/27/14 11/10/14 12/21/14 03/09/15 06/24/15 09/09/15   BMI (kg/m^2) 45.9 42.4 39.8 38.8 35.1 34.1 31.9 31.2 31.2   Fat Mass (lbs) 154.5 143.5 126 110 98 92.5 81 81.5 86.4   Fat Free Mass (lbs) 134 123.0 124.5 134 122.5 122 119.5 115 110   Total Body Water (lbs) 98 90.0 91 98 89.5 89.5 87.5 84 78.6    Preferred Learning Style:   No preference indicated   Learning Readiness:   Ready  24-hr recall: Wakes up around 4am and gets to work around 5am B (5AM): Premier protein shake (30g) Snk (7AM): Dannon [place] yogurt with crunch (12g) Snk (10:30 AM): [employer] protein bar, watermelon (10g) L (12:30PM): yogurt, cantaloupe, watermelon, [employer] protein bar (22g) Snk (PM): watermelon D (PM): spaghetti with meat sauce and garlic toast (99991111) Snk (PM): fudgesicle (5g)  Fluid intake: about 70 oz fluid most days (water with sugar free flavoring), 11 oz protein shake  Estimated total protein intake: at least 60 grams per patient  Medications: see list Supplementation: taking liquid form, sometimes forgets Calcium  Using straws: yes, doesn't feel like like it adds gas Drinking while eating: no Hair loss: some Carbonated beverages: no N/V/D/C: some nausea Dumping syndrome: none  Recent physical activity:  None (back  injury)  Progress Towards Goal(s):  In progress.    Nutritional Diagnosis:  [place]-3.3 Overweight/obesity related to past poor dietary habits and physical inactivity as evidenced by patient w/ recent RYGB surgery following dietary guidelines for continued weight loss.     Intervention:  Nutrition counseling provided.  Teaching Method Utilized:  Visual Auditory Hands on  Barriers to learning/adherence to lifestyle change: taste changes  Demonstrated degree of understanding via:  Teach Back   Monitoring/Evaluation:  Dietary intake, exercise, and body weight. Follow up in 3 months for 17 month post-op visit.

## 2015-09-12 ENCOUNTER — Telehealth: Payer: Self-pay | Admitting: Obstetrics & Gynecology

## 2015-09-12 LAB — HEMOGLOBIN A1C
Hgb A1c MFr Bld: 5.7 % — ABNORMAL HIGH (ref <5.7)
Mean Plasma Glucose: 117 mg/dL

## 2015-09-12 NOTE — Telephone Encounter (Signed)
Patient was asked to call and let Dr Sabra Heck know the next time she becomes weak. She said this happened on Saturday.

## 2015-09-12 NOTE — Telephone Encounter (Signed)
Spoke with patient. Patient states that on Saturday 09/10/2015 while she was at work she had another episode of feeling weak. She checked her blood pressure and it was 89. She had eaten that day. Reports she ate and rechecked her blood sugar which went up to 106. When weakness began she also felt nauseated.  Weakness and nausea lasted for 15 minutes. Denies having any vomiting. Reports she is feeling well today and has not had another episode since Saturday. Advised I will speak with Dr.Miller and return call with further recommendations. She is agreeable.

## 2015-09-15 DIAGNOSIS — M5441 Lumbago with sciatica, right side: Secondary | ICD-10-CM | POA: Diagnosis not present

## 2015-09-15 DIAGNOSIS — M5442 Lumbago with sciatica, left side: Secondary | ICD-10-CM | POA: Diagnosis not present

## 2015-09-15 NOTE — Telephone Encounter (Signed)
Spoke with patient. Advised of message as seen below from [place]. She is agreeable. She is scheduled to see Dr.Baxley tomorrow 09/16/2015 at 4 pm and to see Rox[facility] Hockey on 09/22/2015 at 2:45 pm for nutritional counseling.  Routing to provider for final review. Patient agreeable to disposition. Will close encounter.

## 2015-09-15 NOTE — Telephone Encounter (Signed)
Spoke with patient. Patient reports she ate a bowl of corn flakes this morning at 6 am. At 8 am she began to feel weak, shaky, and sweaty. She had a nurse at her job check her blood sugar which was 69. She drank two cups of orange juice an date a protein bar. Which made her feel better. Around 12 pm she began to feel weak again and rechecked her blood sugar which was 41 at that time. She was given a piece of cake to eat and reports when her blood sugar was rechecked before leaving work it was 125. States that she feels better now, but still does not feel like herself. She is concerned as to why this keeps occuring. Advised I will speak with Dr.Miller and return call with further recommendations. She is agreeable.

## 2015-09-15 NOTE — Telephone Encounter (Signed)
Yes, I think so.  I don't want this to complicate the picture even more.  Thanks.

## 2015-09-15 NOTE — Telephone Encounter (Signed)
I continue to believe this is not just menopause as I have discussed with her.  The lower glucose and hypoglycemia would be consistent with her symptoms.  Can we get her back with Dr. Renold Genta and with the nutritionist at the bariatric clinic?

## 2015-09-15 NOTE — Telephone Encounter (Signed)
Spoke with patient. Advised of message as seen below from [place]. Patient is agreeable and verbalizes understanding. Patient states she was just seen with her nutritionist at the bariatric clinic [facility] on 09/09/2015. Reports she is scheduled for a 3 month follow up with her. She would like to contact her directly to move her appointment up. Also states that she would like to contact Dr.Baxley's office. Offered to help facilitate both appointments, but patient declines. Reports she will contact the office to let Dr.Miller know about her appointment dates and times. Asking if she should discontinue the Clonidine she was prescribed on 09/09/2015 by Dr.Miller. Advised I will speak with Dr.Miller and return call with further recommendations. She is agreeable.

## 2015-09-16 ENCOUNTER — Ambulatory Visit (INDEPENDENT_AMBULATORY_CARE_PROVIDER_SITE_OTHER): Payer: 59 | Admitting: Internal Medicine

## 2015-09-16 ENCOUNTER — Encounter: Payer: Self-pay | Admitting: Internal Medicine

## 2015-09-16 VITALS — BP 118/76 | HR 84 | Temp 97.5°F | Resp 20 | Wt 200.0 lb

## 2015-09-16 DIAGNOSIS — I951 Orthostatic hypotension: Secondary | ICD-10-CM | POA: Diagnosis not present

## 2015-09-16 DIAGNOSIS — R5383 Other fatigue: Secondary | ICD-10-CM | POA: Diagnosis not present

## 2015-09-16 DIAGNOSIS — E538 Deficiency of other specified B group vitamins: Secondary | ICD-10-CM | POA: Diagnosis not present

## 2015-09-16 DIAGNOSIS — E039 Hypothyroidism, unspecified: Secondary | ICD-10-CM

## 2015-09-16 DIAGNOSIS — Z9884 Bariatric surgery status: Secondary | ICD-10-CM | POA: Diagnosis not present

## 2015-09-16 DIAGNOSIS — E162 Hypoglycemia, unspecified: Secondary | ICD-10-CM | POA: Diagnosis not present

## 2015-09-16 DIAGNOSIS — M5431 Sciatica, right side: Secondary | ICD-10-CM | POA: Diagnosis not present

## 2015-09-16 LAB — CBC WITH DIFFERENTIAL/PLATELET
Basophils Absolute: 0 cells/uL (ref 0–200)
Basophils Relative: 0 %
Eosinophils Absolute: 88 cells/uL (ref 15–500)
Eosinophils Relative: 2 %
HCT: 37.1 % (ref 35.0–45.0)
Hemoglobin: 12.3 g/dL (ref 11.7–15.5)
Lymphocytes Relative: 39 %
Lymphs Abs: 1716 cells/uL (ref 850–3900)
MCH: 32.4 pg (ref 27.0–33.0)
MCHC: 33.2 g/dL (ref 32.0–36.0)
MCV: 97.6 fL (ref 80.0–100.0)
MPV: 11.5 fL (ref 7.5–12.5)
Monocytes Absolute: 220 cells/uL (ref 200–950)
Monocytes Relative: 5 %
Neutro Abs: 2376 cells/uL (ref 1500–7800)
Neutrophils Relative %: 54 %
Platelets: 227 10*3/uL (ref 140–400)
RBC: 3.8 MIL/uL (ref 3.80–5.10)
RDW: 13.8 % (ref 11.0–15.0)
WBC: 4.4 10*3/uL (ref 3.8–10.8)

## 2015-09-16 LAB — VITAMIN B12: Vitamin B-12: 501 pg/mL (ref 200–1100)

## 2015-09-16 LAB — T4, FREE: Free T4: 1 ng/dL (ref 0.8–1.8)

## 2015-09-16 MED FILL — ACCU-CHEK SOFTCLIX LANCETS: 25 days supply | Qty: 100 | Fill #0

## 2015-09-16 MED FILL — ACCU-CHEK AVIVA PLUS METER: W/DEVICE | 20 days supply | Qty: 1 | Fill #0

## 2015-09-16 MED FILL — HYDROCODON-APAP 10-325: 10-325 | 30 days supply | Qty: 60 | Fill #0

## 2015-09-16 MED FILL — ACCU-CHEK AVIVA PLUS TEST S: 25 days supply | Qty: 100 | Fill #0

## 2015-09-16 NOTE — Progress Notes (Signed)
GYNECOLOGY  VISIT   HPI: 55 y.o. [id number] Married [place] American female here for follow up after changing her HRT to vivelle dot 0.1mg  patches.  Switch was done after she started oral estradiol without significant improvement in her hot flashes.  Initial HRT was started by Dr. Renold Genta.  Pt has hx of hysterectomy so does not need progesterone.  I wondered whether her oral absorption wasn't as good due to her bariatric surgery.  Pt reports that the change in estrogen has helped her joints and body aches.  This is much better.  As well, her little hot flashes are better and her night sweats are better.  Also, her sleep is better.  What is unchanged, however, are episodes that she describes as "hot flashes" but are much more severe in length.  Also, once the episode is over, she feels so washed out and exhausted.  Co workers at work can tell when she is having one of these and she just needs to sit and wait for a few minutes to feel better.  Food helps as well.  Pt was able to check a BS after one of these episodes and it was around 100.  I've wondered if these were hypoglycemic events or hypotension.  She's never felt dizzy or swimmy headed with the symptoms.  She's never passed out.  These episodes are just not consistent with the description of hot flashes that women give and I'm not sure we are looking in the right direction with this.    Feel some additional blood work is appropriate.  Pt comfortable with this.  She'll do most anything I suggest if I can get to the bottom of this.   Pt reports she pulled her back last week and is having some back pain today but this is unrelated.  She does not request any treatment recommendations for this today.  GYNECOLOGIC HISTORY: No LMP recorded. Patient has had a hysterectomy.  Patient Active Problem List   Diagnosis Date Noted  . Lap gastric bypass May 2016 07/06/2014  . Chest pain 03/11/2013  . History of Clostridium difficile infection 08/12/2011  .  Hypothyroidism, postsurgical 08/26/2010  . GE reflux 08/26/2010  . Vitamin D deficiency 08/26/2010  . Fibrocystic breast disease 08/26/2010  . B12 deficiency 08/26/2010  . OBESITY, UNSPECIFIED 03/20/2010  . BACK PAIN 03/20/2010    Past Medical History  Diagnosis Date  . Hx of adenomatous colonic polyps 10/17/07  . Osteoarthritis     left hip  . Hydradenitis   . GERD (gastroesophageal reflux disease)   . Hypothyroidism   . Pernicious anemia   . Vitamin D deficiency   . HSV-2 infection   . Obesity   . Hiatal hernia   . C. difficile diarrhea 10/2010    Infection occurred after father had infection  . Hemorrhoids   . Varicose veins   . Headache     HX MIGRAINES  . History of transfusion   . Complication of anesthesia     HAD DIFFICULTY SWOLLING SOLID FOOD AFTER INTUBATION FOR PAST SURG  . PONV (postoperative nausea and vomiting)   . Degenerative joint disease of low back 09/02/15     Dr. Maureen Ralphs    Past Surgical History  Procedure Laterality Date  . Hip arthroplasty  01/31/09    left  . Thyroidectomy  1980  . Refractive surgery    . Hsv 2    . Cesarean section  1985  . Cholecystectomy  1987  . Abdominal hysterectomy  2004    ovaries remain  . Total hip arthroplasty Right 05/21/2012    Procedure: TOTAL HIP ARTHROPLASTY;  Surgeon: Gearlean Alf, MD;  Location: [facility];  Service: Orthopedics;  Laterality: Right;  . Breath tek h pylori N/A 05/03/2014    Procedure: BREATH TEK H PYLORI;  Surgeon: Kaylyn Lim, MD;  Location: [facility];  Service: General;  Laterality: N/A;  . Joint replacement  01/2009    l hip (BILATERAL TOTAL HIPS)  . Hammer toe surgery    . Gastric roux-en-y N/A 07/06/2014    Procedure: LAPAROSCOPIC ROUX-EN-Y GASTRIC BYPASS, ENTEROLYSIS OF ADHESIONS WITH UPPER ENDOSCOPY;  Surgeon: Johnathan Hausen, MD;  Location: [facility];  Service: General;  Laterality: N/A;  . Hiatal hernia repair N/A 07/06/2014    Procedure: LAPAROSCOPIC REPAIR OF HIATAL HERNIA;  Surgeon:  Johnathan Hausen, MD;  Location: [facility];  Service: General;  Laterality: N/A;    MEDS:  Reviewed in EPIC and UTD  ALLERGIES: Review of patient's allergies indicates no known allergies.  Family History  Problem Relation Age of Onset  . Diabetes Father   . Diabetes Sister   . Hypertension Sister   . Hypertension Mother     SH:  Married, non smoker  Review of Systems  All other systems reviewed and are negative.   PHYSICAL EXAMINATION:    BP 92/54 mmHg  Pulse 80  Resp 14  Ht 5\' 7"  (1.702 m)  Wt 197 lb 12.8 oz (89.721 kg)  BMI 30.97 kg/m2    General appearance: alert, cooperative and appears stated age Neck: no adenopathy, supple, symmetrical, trachea midline and thyroid normal to inspection and palpation CV:  Regular rate and rhythm Lungs:  clear to auscultation, no wheezes, rales or rhonchi, normal breathing  Assessment: Episodes of hot flashes that are long and cause significant fatigue when completed, possible hypoglycemic events? Hypothyroidism H/O pernicious anemia >100# weight loss since bariatric surgery  Plan: Consider starting clonidine 0.1mg  two times daily to see if this will help.  If it is hot flashes, this should help some. Pt does desire to continue Vivelle dot 0.1mg  patches twice weekly.  Feels this has helped. CMET, HbA1C, TSH Pt is going to try and check BS again with future "episodes" as I am not convinced this is just post menopausal related hot flashes.  If blood work is normal, will consider having pt see Dr. Renold Genta again and her nutritionist.   ~20 minutes spent with patient >50% of time was in face to face discussion of above.

## 2015-09-19 ENCOUNTER — Telehealth: Payer: Self-pay | Admitting: Internal Medicine

## 2015-09-19 ENCOUNTER — Encounter: Payer: Self-pay | Admitting: Internal Medicine

## 2015-09-19 LAB — CYCLIC CITRUL PEPTIDE ANTIBODY, IGG: Cyclic Citrullin Peptide Ab: <16 Units

## 2015-09-19 NOTE — Telephone Encounter (Signed)
Called patient to see how she was doing over the weekend with low blood pressure and hypoglycemia issues. Received voicemail. Left message for patient to call back.

## 2015-09-19 NOTE — Telephone Encounter (Signed)
Endocrine labs still pending that were drawn Friday.

## 2015-09-19 NOTE — Telephone Encounter (Signed)
She's returning your call; states that what was bothering her Friday was the pain in her leg.  She checked her sugar.  When she got up on Saturday, her first blood sugar was 71.  The highest on Saturday was 84.   Sunday, she experimented.  When she got up, it was 30.  Her sugar did get up to 109.  She did eat some yeast rolls, pasta, etc.  However, it did drop throughout the day.   When she got up this a.m. At 5:30, it was 69, she ate yogurt which had 11 grams of protein in it and checked it 2 hours later, it was down to 55.  She has googled "Reactive Hypoglycemia in Gastric Bypass" patients.  She feels she is having this.  Says she she has tried peanut butter crackers, has tried protein shake.  Says it will bring the sugar up to 118, but it won't stay up there.    She see's the dietician on Thursday and she's also going to try and make an appointment to see the surgeon too.  Sounds very weak this morning.  She would like for you to let her know what you think as well.  She has tried to eat things she wasn't supposed to eat.  It will bring the sugar up, but it goes back down.    Her pattern of waking up is in the 70's.  She is trying to eat a snack in bed at night to try to sustain her until we get a handle on this further.  She was just returning your call and provided all of this information on how she was feeling.

## 2015-09-21 MED FILL — VALACYCLOVIR HCL 500 MG TAB: 500 | 90 days supply | Qty: 90 | Fill #1

## 2015-09-21 MED FILL — DOXYCYCLINE HYCLATE 100 MG: 100 | 10 days supply | Qty: 20 | Fill #1

## 2015-09-22 ENCOUNTER — Other Ambulatory Visit: Payer: Self-pay | Admitting: Internal Medicine

## 2015-09-22 ENCOUNTER — Encounter: Payer: Self-pay | Admitting: Dietician

## 2015-09-22 ENCOUNTER — Encounter: Payer: 59 | Admitting: Dietician

## 2015-09-22 DIAGNOSIS — E162 Hypoglycemia, unspecified: Secondary | ICD-10-CM

## 2015-09-22 DIAGNOSIS — Z6831 Body mass index (BMI) 31.0-31.9, adult: Secondary | ICD-10-CM | POA: Diagnosis not present

## 2015-09-22 DIAGNOSIS — M549 Dorsalgia, unspecified: Secondary | ICD-10-CM | POA: Diagnosis not present

## 2015-09-22 DIAGNOSIS — K219 Gastro-esophageal reflux disease without esophagitis: Secondary | ICD-10-CM | POA: Diagnosis not present

## 2015-09-22 DIAGNOSIS — Z79899 Other long term (current) drug therapy: Secondary | ICD-10-CM | POA: Diagnosis not present

## 2015-09-22 DIAGNOSIS — M5442 Lumbago with sciatica, left side: Secondary | ICD-10-CM | POA: Diagnosis not present

## 2015-09-22 DIAGNOSIS — M1990 Unspecified osteoarthritis, unspecified site: Secondary | ICD-10-CM | POA: Diagnosis not present

## 2015-09-22 NOTE — Telephone Encounter (Signed)
Please call pharmacy and correct RX

## 2015-09-22 NOTE — Progress Notes (Signed)
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  Victoria [name] returns to address issues with low blood sugars. She states that she thought she was having hot flashes, was having sweating and dizziness. Episodes began 5-6 months ago but have become more frequent lately. Last week she tested her blood sugar during an episode and it was 69 mg/dL at 8 am after having Frosted Flakes with 2% milk at 6am. Drank apple juice and ate a protein bar and eventually felt better. On the same day at 12:10 pm her blood sugar was 41 mg/dL. Saw PCP and got a glucometer and was advised to test sugars 4x a day. The next day when she woke up her sugar was 71 mg/dL. Victoria [name] states her sugars are usually in the 70s when she wakes up around 4-5 am and then in the 50-60s around 10-10:30 am, even after eating. Had a blood sugar of 51 mg/dL 1.5 hours after eating salmon and a few bites of pasta; another 2.5 hours later her blood sugar rose to 72 mg/dL (did not eat anything else). Last night ate hamburger on a bun with cheese and tater tots around 7pm and blood sugar was 89 mg/dL this morning.   States she did not have problems with blood sugar (highs or lows) prior to surgery. Diabetes runs in her family and she states her reason for having bariatric surgery was to prevent diabetes.      Surgery date: 07/06/2014 Surgery type: RYGB Start weight at [facility]: 286 lbs on 05/01/14 (highest weight 291 per patient) Weight today: 196.5 lbs Weight change: 0 lbs Total weight loss: 95 lbs  Goal: 180 lbs  TANITA  BODY COMP RESULTS  06/21/14 07/20/14 08/26/14 09/27/14 11/10/14 12/21/14 03/09/15 06/24/15 09/09/15 09/22/15   BMI (kg/m^2) 45.9 42.4 39.8 38.8 35.1 34.1 31.9 31.2 31.2 NA   Fat Mass (lbs) 154.5 143.5 126 110 98 92.5 81 81.5 86.4    Fat Free Mass (lbs) 134 123.0 124.5 134 122.5 122 119.5 115 110     Total Body Water (lbs) 98 90.0 91 98 89.5 89.5 87.5 84 78.6     Preferred Learning Style:   No preference indicated   Learning Readiness:   Ready  24-hr recall: Wakes up around 4am and gets to work around 5am B (5AM): Premier protein shake (30g) Snk (7AM): Dannon [place] yogurt with crunch (12g) Snk (10:30 AM): [employer] protein bar, watermelon (10g) L (12:30PM): yogurt, cantaloupe, watermelon, [employer] protein bar (22g) Snk (PM): watermelon D (PM): spaghetti with meat sauce and garlic toast (99991111) Snk (PM): fudgesicle (5g)  Fluid intake: about 70 oz fluid most days (water with sugar free flavoring), 11 oz protein shake  Estimated total protein intake: at least 60 grams per patient  Medications: see list Supplementation: taking liquid form, sometimes forgets Calcium  Using straws: yes, doesn't feel like like it adds gas Drinking while eating: no Hair loss: some Carbonated beverages: no N/V/D/C: some nausea Dumping syndrome: none  Recent physical activity:  None (back injury)  Progress Towards Goal(s):  In progress.    Nutritional Diagnosis:  [place]-3.3 Overweight/obesity related to past poor dietary habits and physical inactivity as evidenced by patient w/ recent RYGB surgery following dietary guidelines for continued weight loss.     Intervention:  Nutrition counseling provided. Goals: -Continue to include foods in blood sugar log -Try to observe a pattern with  types of foods, timing, and blood sugar -Keep 4 oz juice boxes on hand at all times  -Check blood sugar 15 minutes later, repeat if needed -Have a night snack with carbs, protein, and fat (fruit + nuts or cheese) -Try to "match" your carbs and protein  -Try having a Boost Glucose Control as your snack at work if you're busy  1200-1400 kcal per day 100+ gram protein per day 31 grams fat 110-160 grams of carbs (20-30 grams 5x a day)   Teaching Method Utilized:  Visual Auditory Hands  on  Barriers to learning/adherence to lifestyle change: taste changes  Demonstrated degree of understanding via:  Teach Back   Monitoring/Evaluation:  Dietary intake, exercise, and body weight. Follow up in 2 weeks.

## 2015-09-22 NOTE — Patient Instructions (Addendum)
Goals:  -Continue to include foods in blood sugar log -Try to observe a pattern with types of foods, timing, and blood sugar -Keep 4 oz juice boxes on hand at all times  -Check blood sugar 15 minutes later, repeat if needed -Have a night snack with carbs, protein, and fat (fruit + nuts or cheese) -Try to "match" your carbs and protein  -Try having a Boost Glucose Control as your snack at work if you're busy   1200-1400 kcal per day 100+ gram protein per day 31 grams fat 110-160 grams of carbs (20-30 grams 5x a day)

## 2015-09-24 NOTE — Progress Notes (Signed)
   Subjective:    Patient ID: Victoria [name], female    DOB: 11/27/1960, 55 y.o.   MRN: [id number]  HPI Patient is here today for an acute visit very anxious and upset. She's been having episodes of hypoglycemia.. She's frustrated about this. Thinks that she's been eating enough. Says she hasn't quite met her go with weight loss but has lost a considerable amount of weight. Has been told to eat a lot of protein by dietitian. She has appointment soon to make dietitian to review her dietary intake. At one point she was at work and experienced an acute episode of hypoglycemia and had to be treated by staff that she works with.  I'm not sure she is getting enough calories. She is eating a fair amount of protein. She doesn't eat all that much before 3 PM in my estimation. This is when she is at work as well an expanding most of her energy. She has no prior history of diabetes. Her mother has history of impaired glucose tolerance. She says she is drinking plenty of fluids. I'm not sure why she is hypotensive and less she's having vagal reactions and possibly reactive hypoglycemia. I spent about 40 minutes with her today trying to figure this out. We are going to check for an insulinoma.  Another issue she brings up today is that her right leg is hurting. She seen Dr. Maureen Ralphs and she's recently had an MRI. She appears to have right sciatica. I have given her prescription for hydrocodone/APAP to help with pain. She will need follow-up with Dr. Reynaldo Minium with regard to this.  I have given her prescription for Accu-Chek machine and test strips and she is going to need to check Accu-Cheks very regularly this coming weekend. She'll call me if she is not doing well.    Review of Systems see above     Objective:   Physical Exam Skin warm and dry. Nodes none. Chest clear. Cardiac exam regular rate and rhythm.       Assessment & Plan:  Right sciatica-may have right herniated nucleus pulposus or lumbar spinal  stenosis with nerve root impingement. Given narcotic pain medication for pain relief. Follow up with orthopedist  Episodes of hypoglycemia-etiology unclear to me at this point in time. She will keep 5 on Accu-Cheks and try to take in a bit more calories and meet with dietitian next week. Check for insulinoma.  Episodes of hypotension-may be vagal-type episodes  Hypothyroidism-TSH is been checked regularly  History of B-12 deficiency-check B-12 level

## 2015-09-24 NOTE — Patient Instructions (Addendum)
Perform Accu-Cheks before meals and at bedtime. Call if persistent hypotension and/or hypoglycemia. Try to eat more calories. See dietitian next week. Hydrocodone/APAP for leg pain. May need to see endocrinologist regarding episodes of hypoglycemia

## 2015-09-26 LAB — PROINSULIN/INSULIN RATIO
Insulin: 4 u[IU]/mL (ref 3–19)
Proinsulin/Insulin Ratio: 7.1 % (ref 0.8–21.7)
Proinsulin: 1.7 pmol/L (ref <=8.0)

## 2015-09-27 MED FILL — ACCU-CHEK AVIVA PLUS TEST S: 25 days supply | Qty: 200 | Fill #0

## 2015-09-27 MED FILL — ESTRADIOL 0.1 MG PATCH: 0.1 | 28 days supply | Qty: 8 | Fill #2

## 2015-10-03 ENCOUNTER — Telehealth: Payer: Self-pay | Admitting: Internal Medicine

## 2015-10-03 MED FILL — LEVOTHYROXINE 100 MCG TAB: 100 | 90 days supply | Qty: 90 | Fill #1

## 2015-10-03 NOTE — Telephone Encounter (Signed)
Patient states she's unable to get in to see Dr. Dwyane Dee until 9/1.  States she continues to feel horrible and is doing what the nutritionist is telling her to do.  She continues to be symptomatic.  This morning she drank a protein shake and ate a protein bar and about an hour later, her finger stick was 59.  Says she would gladly see someone else if you feel that is acceptable.   She just wants to feel better.    Or, if you are set on her seeing Dr. Dwyane Dee, she will wait.  She just wants to know if she has to wait for Dr. Dwyane Dee or if there is anyone else she can see??  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  Asks that we call her there at # [phone] with any response.    Thanks.

## 2015-10-03 NOTE — Telephone Encounter (Signed)
Spoke with patient and advised that she should keep her appointment for 9/1 with Dr. Dwyane Dee.  Reminded patient that she can call Dr. Ronnie Derby office and see if they have any cancellations as well.  Patient verbalized understanding of these instructions.

## 2015-10-03 NOTE — Telephone Encounter (Signed)
Once again Protein is NOT going to increase blood sugar. She needs to wait on Endocrine appt. These appts are hard to get. Needs to eat enough calories in AM to get through the day. It is not sustaining her and she is dropping glucose during the day with activity.

## 2015-10-04 ENCOUNTER — Other Ambulatory Visit: Payer: Self-pay | Admitting: Internal Medicine

## 2015-10-04 MED FILL — CYCLOBENZAPRINE 10 MG TAB: 10 | 30 days supply | Qty: 30 | Fill #3

## 2015-10-04 MED FILL — PROMETHAZINE 25 MG TABLET: 25 | 10 days supply | Qty: 30 | Fill #0

## 2015-10-06 ENCOUNTER — Encounter: Payer: 59 | Attending: Internal Medicine | Admitting: Dietician

## 2015-10-06 ENCOUNTER — Encounter: Payer: Self-pay | Admitting: Dietician

## 2015-10-06 DIAGNOSIS — Z6831 Body mass index (BMI) 31.0-31.9, adult: Secondary | ICD-10-CM | POA: Insufficient documentation

## 2015-10-06 DIAGNOSIS — M1990 Unspecified osteoarthritis, unspecified site: Secondary | ICD-10-CM | POA: Insufficient documentation

## 2015-10-06 DIAGNOSIS — K219 Gastro-esophageal reflux disease without esophagitis: Secondary | ICD-10-CM | POA: Insufficient documentation

## 2015-10-06 DIAGNOSIS — Z79899 Other long term (current) drug therapy: Secondary | ICD-10-CM | POA: Insufficient documentation

## 2015-10-06 DIAGNOSIS — M549 Dorsalgia, unspecified: Secondary | ICD-10-CM | POA: Insufficient documentation

## 2015-10-06 NOTE — Progress Notes (Signed)
Follow-up visit: 15 months Post-Operative RYGB Surgery  Medical Nutrition Therapy:  Appt start time: 200 End time: 300  Primary concerns today: Post-operative Bariatric Surgery Nutrition Management.  7/27/17[name] returns to address issues with low blood sugars. She states that she thought she was having hot flashes, was having sweating and dizziness. Episodes began 5-6 months ago but have become more frequent lately. Last week she tested her blood sugar during an episode and it was 69 mg/dL at 8 am after having Frosted Flakes with 2% milk at 6am. Drank apple juice and ate a protein bar and eventually felt better. On the same day at 12:10 pm her blood sugar was 41 mg/dL. Saw PCP and got a glucometer and was advised to test sugars 4x a day. The next day when she woke up her sugar was 71 mg/dL. [name] states her sugars are usually in the 70s when she wakes up around 4-5 am and then in the 50-60s around 10-10:30 am, even after eating. Had a blood sugar of 51 mg/dL 1.5 hours after eating salmon and a few bites of pasta; another 2.5 hours later her blood sugar rose to 72 mg/dL (did not eat anything else). Last night ate hamburger on a bun with cheese and tater tots around 7pm and blood sugar was 89 mg/dL this morning.   States she did not have problems with blood sugar (highs or lows) prior to surgery. Diabetes runs in her family and she states her reason for having bariatric surgery was to prevent diabetes.    8/10/2017[name] returns with continued problems with low blood sugars. She has attempted to follow the nutrition recommendations we discussed at last visit. She has been trying to eat a large, balanced meal in the evenings around 7-8pm. Also trying to eat carbs + protein every hour or 2. Finds that this is not helping her maintain a normal blood sugar. Feels like carbohydrates make blood sugar drop even further. She tried having a meal of 40 grams of protein and 15 grams of carbs but notes that she  still had an episode of hypoglycemia. She has also continued to log foods and blood glucose. She states she feels "foggy and fuzzy" with a slight headache and also has sweating. Has also noticed blurry vision and shortness of breath. She has not lost consciousness. Continues to drop into 40-50s mid morning regardless of what she eats; notes that her glucose is usually in the 70-80s when she wakes up. Disappointed that she has gained 5 pounds and wonders "why eat this way" if it does not help stabilize blood sugar. Experimenting with different macronutrient distrubution. She treats hypoglycemia with 15 grams of sugar (4 oz apple juice) when she can but has difficulty being consistent due to work schedule.   Patient provided the following examples from her food and blood glucose log:   7:05 AM: Protein shake with 19g carbs and 15g protein. Glucose was 76 mg/dL at 8:36am.  Had some peanut butter crackers at 9:07 am and sugar was 60 mg/dL at 10:41am.  Ate Greek yogurt (11g protein and 16 g carbs) at 11:10 am, blood sugar was 68mg /dL at 12:42 pm.  Protein bar at 1:10pm (10g protein and 14 g CHO), sugar was 47 mg/dL at 2:53pm.   Blood sugar dropped to 28mg /dL 2 hours after eating a [place] bar.  Blood sugar was 70 mg/dL and dropped to 53mg /dL 2 hours after eating sausage biscuit with jelly. Patient then ate a plum and blood sugar  dropped to 48 mg/dL. Over the course of the next hour she ate 8 crackers, protein bar and 3 soft peppermints, yogur, and a small peach. Thirty minutes later her glucose dropped to 50mg /dL. She states that she felt very full but her blood sugar was still low.   At 7:59 am had a reading of 65 mg/dL, treated with 4 oz apple juice, rechecked at 8:40 and blood sugar was 57 mg/dL.  Ate protein bar and 6 pretzel chips, rechecked at 9:32 and was 79 mg/dL.  1 hour later ate another 10 pretzel chips and Greek yogurt, 55mg /dL at 11:44am.   Went on a 30-minute walk and sugar had dropped  almost 10 points. After her walk she ate an apple, a piece of lemon cake, and goldfish crackers and blood sugar rose to 142 mg/dL.  She notes the only other blood sugar over 100 mg/dl was 120 mg/dL and dropped back down to 80 mg/dL 2 hours later (she had not eaten).  Fluid intake: patient estimates about 32-50 oz per day. Plain water, water with sugar free flavoring, apple juice (4 oz at a time only as hypoglycemia treatment)  Patient states she urinates frequently. Has irregular bowel movements but this is normal for her.   Surgery date: 07/06/2014 Surgery type: RYGB Start weight at [facility]: 286 lbs on 05/01/14 (highest weight 291 per patient) Weight today: 196.5 lbs Weight change: 0 lbs Total weight loss: 95 lbs  Goal: 180 lbs  TANITA  BODY COMP RESULTS  06/21/14 07/20/14 08/26/14 09/27/14 11/10/14 12/21/14 03/09/15 06/24/15 09/09/15 09/22/15   BMI (kg/m^2) 45.9 42.4 39.8 38.8 35.1 34.1 31.9 31.2 31.2 NA   Fat Mass (lbs) 154.5 143.5 126 110 98 92.5 81 81.5 86.4    Fat Free Mass (lbs) 134 123.0 124.5 134 122.5 122 119.5 115 110    Total Body Water (lbs) 98 90.0 91 98 89.5 89.5 87.5 84 78.6     Preferred Learning Style:   No preference indicated   Learning Readiness:   Ready  Medications: see list Supplementation: taking liquid form, sometimes forgets Calcium  Using straws: yes, doesn't feel like like it adds gas Drinking while eating: no Hair loss: some Carbonated beverages: no N/V/D/C: some nausea Dumping syndrome: none  Recent physical activity:  None (back injury)  Progress Towards Goal(s):  In progress.    Nutritional Diagnosis:  [place]-3.3 Overweight/obesity related to past poor dietary habits and physical inactivity as evidenced by patient w/ recent RYGB surgery following dietary guidelines for continued weight loss.     Intervention:  Nutrition counseling provided. Goals: -Continue to include foods in blood sugar log -Try to observe a pattern with types of foods, timing, and  blood sugar -Keep 4 oz juice boxes on hand at all times  -Check blood sugar 15 minutes later, repeat if needed -Have a night snack with carbs, protein, and fat (fruit + nuts or cheese) -Try to "match" your carbs and protein  -Try having a Boost Glucose Control as your snack at work if you're busy  1200-1400 kcal per day 100+ gram protein per day 31 grams fat 110-160 grams of carbs (20-30 grams 5x a day)   Teaching Method Utilized:  Visual Auditory Hands on  Barriers to learning/adherence to lifestyle change: taste changes  Demonstrated degree of understanding via:  Teach Back   Monitoring/Evaluation:  Dietary intake, exercise, and body weight. Follow up in 2 weeks.

## 2015-10-06 NOTE — Patient Instructions (Addendum)
Goals:  -Make an appointment with Dr. Hassell Done -Call about getting on a wait list at Dr. Ronnie Derby office  -Continue to include foods in blood sugar log -Try to observe a pattern with types of foods, timing, and blood sugar -Keep 4 oz juice boxes on hand at all times  -Check blood sugar 15 minutes later, repeat if needed -Have a night snack with carbs, protein, and fat (fruit + nuts or cheese) -Try to "match" your carbs and protein  -Try having a Boost Glucose Control as your snack at work if you're busy   1200-1400 kcal per day 100+ gram protein per day 31 grams fat 110-160 grams of carbs (20-30 grams 5x a day)

## 2015-10-07 NOTE — Progress Notes (Signed)
She has appt to see Dr. Dwyane Dee, Endocrinologist Sept 1st. I still think she is not consuming enough in am to get through her hectice am schedule and drops her sugar. I have rules out Insulinoma. Thanks for seeing her.

## 2015-10-26 MED FILL — ALPRAZolam 0.5 MG TABS: 0.5 | 30 days supply | Qty: 30 | Fill #1

## 2015-10-27 MED FILL — CYANOCOBALAMIN 1,000 MCG/ML: 1000 | 84 days supply | Qty: 3 | Fill #0

## 2015-10-27 MED FILL — ESTRADIOL 0.1 MG PATCH: 0.1 | 84 days supply | Qty: 24 | Fill #0

## 2015-10-28 ENCOUNTER — Encounter: Payer: Self-pay | Admitting: Endocrinology

## 2015-10-28 ENCOUNTER — Ambulatory Visit (INDEPENDENT_AMBULATORY_CARE_PROVIDER_SITE_OTHER): Payer: 59 | Admitting: Endocrinology

## 2015-10-28 VITALS — BP 120/78 | HR 61 | Ht 66.5 in | Wt 200.6 lb

## 2015-10-28 DIAGNOSIS — K911 Postgastric surgery syndromes: Secondary | ICD-10-CM | POA: Diagnosis not present

## 2015-10-28 DIAGNOSIS — E162 Hypoglycemia, unspecified: Secondary | ICD-10-CM | POA: Diagnosis not present

## 2015-10-28 DIAGNOSIS — E161 Other hypoglycemia: Secondary | ICD-10-CM

## 2015-10-28 MED ORDER — ACARBOSE 25 MG PO TABS: ORAL_TABLET | ORAL | 2 refills | Status: DC

## 2015-10-28 MED ORDER — VERAPAMIL HCL ER 180 MG PO TBCR: 180.0000 mg | EXTENDED_RELEASE_TABLET | Freq: Every day | ORAL | 1 refills | Status: DC

## 2015-10-28 MED FILL — VERAPAMIL ER 180 MG TABLET: 180 | 30 days supply | Qty: 30 | Fill #0

## 2015-10-28 MED FILL — ACCU-CHEK AVIVA PLUS TEST S: 25 days supply | Qty: 200 | Fill #1

## 2015-10-28 NOTE — Patient Instructions (Addendum)
3 main meals and 1-2 snacks if going >4 hrs between meals  Acarbose 1/2 Bfst and lunch and after 1 week 1 tab at bfst and lunch

## 2015-10-28 NOTE — Progress Notes (Addendum)
Patient ID: Victoria [name], female   DOB: 03-12-1960, 55 y.o.   MRN: [id number]            Chief complaint: Low blood sugar episodes  Referred by: Dr Renold Genta  History of Present Illness:  Around May 2017 the patient started having symptoms of episodic sweating and shakiness along with the sensation of nearly passing out. She initially went to her gynecologist thinking it was hot flashes. Subsequently patient had more significant symptoms which became more than once a month occurrence She then started checking her blood sugars and appeared that she was having low blood sugars when she was having symptoms  She still has symptoms although not every time she has a low blood sugar documented with feeling hot, sweaty, fatigued and weak and a feeling of passing out. She tends to have low blood sugars within 1-2 hours of her meals but particularly after breakfast and lunch and usually not after supper per Has not had any overnight low blood sugars except once after midnight Fasting blood sugars: Usually low normal, recent range 66-88 Postprandial readings in the last week or so range from 46-77 after breakfast and 34-97 after lunch from her Accu-Chek monitor download  She is treating low blood sugar symptoms with eating a peppermint candy or having a protein bar or protein shake, does have glucose tablets available but not using them.  Occasionally will use apple juice  She has been to the nutritionist a couple of times and has been told to follow a low carbohydrate high protein diet with snacks in between meals.  As a result the patient is eating more frequently and than before and gaining some weight also Her breakfast is usually [place] yogurt, protein bar or protein shake At lunch may sometimes have chili from [facility], dinner is usually low in carbohydrate, may have some fruit with certain meals  She does not think she has had a reduction in her symptoms with changing her diet Also occasionally  has found her sugar to be low without symptoms  Weight history: Prior to her gastric bypass surgery her weight was 291  Wt Readings from Last 3 Encounters:  10/28/15 200 lb 9.6 oz (91 kg)  09/16/15 200 lb (90.7 kg)  09/09/15 197 lb 12.8 oz (89.7 kg)       Past Medical History:  Diagnosis Date  . C. difficile diarrhea 10/2010   Infection occurred after father had infection  . Complication of anesthesia    HAD DIFFICULTY SWOLLING SOLID FOOD AFTER INTUBATION FOR PAST SURG  . Degenerative joint disease of low back 09/02/15    Dr. Maureen Ralphs  . GERD (gastroesophageal reflux disease)   . Headache    HX MIGRAINES  . Hemorrhoids   . Hiatal hernia   . History of transfusion   . HSV-2 infection   . Hx of adenomatous colonic polyps 10/17/07  . Hydradenitis   . Hypothyroidism   . Obesity   . Osteoarthritis    left hip  . Pernicious anemia   . PONV (postoperative nausea and vomiting)   . Varicose veins   . Vitamin D deficiency     Past Surgical History:  Procedure Laterality Date  . ABDOMINAL HYSTERECTOMY  2004   ovaries remain  . BREATH TEK H PYLORI N/A 05/03/2014   Procedure: BREATH TEK H PYLORI;  Surgeon: Kaylyn Lim, MD;  Location: [facility];  Service: General;  Laterality: N/A;  . [place]  . CHOLECYSTECTOMY  1987  .  GASTRIC ROUX-EN-Y N/A 07/06/2014   Procedure: LAPAROSCOPIC ROUX-EN-Y GASTRIC BYPASS, ENTEROLYSIS OF ADHESIONS WITH UPPER ENDOSCOPY;  Surgeon: Johnathan Hausen, MD;  Location: [facility];  Service: General;  Laterality: N/A;  . HAMMER TOE SURGERY    . HIATAL HERNIA REPAIR N/A 07/06/2014   Procedure: LAPAROSCOPIC REPAIR OF HIATAL HERNIA;  Surgeon: Johnathan Hausen, MD;  Location: [facility];  Service: General;  Laterality: N/A;  . HIP ARTHROPLASTY  01/31/09   left  . hsv 2    . JOINT REPLACEMENT  01/2009   l hip (BILATERAL TOTAL HIPS)  . REFRACTIVE SURGERY    . THYROIDECTOMY  1980  . TOTAL HIP ARTHROPLASTY Right 05/21/2012   Procedure: TOTAL HIP ARTHROPLASTY;   Surgeon: Gearlean Alf, MD;  Location: [facility];  Service: Orthopedics;  Laterality: Right;    Family History  Problem Relation Age of Onset  . Diabetes Father   . Diabetes Sister   . Hypertension Sister   . Hypertension Mother     Social History:  reports that she has never smoked. She has never used smokeless tobacco. She reports that she does not drink alcohol or use drugs.  Allergies: No Known Allergies    Medication List       Accurate as of 10/28/15 10:38 AM. Always use your most recent med list.          ALPRAZolam 0.5 MG tablet Commonly known as:  XANAX TAKE 1 TABLET BY MOUTH AT BEDTIME   AMOXICILLIN PO Take 2,000 mg by mouth. Prior to dental visits   CALCIUM-VITAMIN D3 PO Take 15 mLs by mouth.   cyanocobalamin 1000 MCG/ML injection Commonly known as:  (VITAMIN B-12) INJECT 1 ML INTRAMUSCULARLY EVERY MONTH   cyclobenzaprine 10 MG tablet Commonly known as:  FLEXERIL TAKE 1 TABLET BY MOUTH AT BEDTIME   estradiol 0.1 MG/24HR patch Commonly known as:  VIVELLE-DOT Place 1 patch (0.1 mg total) onto the skin 2 (two) times a week.   glucose blood test strip Commonly known as:  ACCU-CHEK AVIVA PLUS Use for testing glucose up to 8 times a day prn   HYDROcodone-acetaminophen 10-325 MG tablet Commonly known as:  NORCO Take 1 tablet by mouth every 8 (eight) hours as needed.   levothyroxine 100 MCG tablet Commonly known as:  SYNTHROID, LEVOTHROID Take 1 tablet (100 mcg total) by mouth daily.   MULTIVITAMIN PO Take 30 mLs by mouth.   pantoprazole 40 MG tablet Commonly known as:  PROTONIX Take 1 tablet (40 mg total) by mouth 2 (two) times daily.   promethazine 25 MG tablet Commonly known as:  PHENERGAN TAKE 1 TABLET BY MOUTH EVERY 8 HOURS AS NEEDED FOR NAUSEA AND VOMITING   valACYclovir 500 MG tablet Commonly known as:  VALTREX TAKE 1 TABLET BY MOUTH ONCE DAILY       LABS:  No visits with results within 1 Week(s) from this visit.  Latest known  visit with results is:  Office Visit on 09/16/2015  Component Date Value Ref Range Status  . WBC 09/16/2015 4.4  3.8 - 10.8 K/uL Final  . RBC 09/16/2015 3.80  3.80 - 5.10 MIL/uL Final  . Hemoglobin 09/16/2015 12.3  11.7 - 15.5 g/dL Final  . HCT 09/16/2015 37.1  35.0 - 45.0 % Final  . MCV 09/16/2015 97.6  80.0 - 100.0 fL Final  . MCH 09/16/2015 32.4  27.0 - 33.0 pg Final  . MCHC 09/16/2015 33.2  32.0 - 36.0 g/dL Final  . RDW 09/16/2015 13.8  11.0 -  15.0 % Final  . Platelets 09/16/2015 227  140 - 400 K/uL Final  . MPV 09/16/2015 11.5  7.5 - 12.5 fL Final  . Neutro Abs 09/16/2015 2376  1,500 - 7,800 cells/uL Final  . Lymphs Abs 09/16/2015 1716  850 - 3,900 cells/uL Final  . Monocytes Absolute 09/16/2015 220  200 - 950 cells/uL Final  . Eosinophils Absolute 09/16/2015 88  15 - 500 cells/uL Final  . Basophils Absolute 09/16/2015 0  0 - 200 cells/uL Final  . Neutrophils Relative % 09/16/2015 54  % Final  . Lymphocytes Relative 09/16/2015 39  % Final  . Monocytes Relative 09/16/2015 5  % Final  . Eosinophils Relative 09/16/2015 2  % Final  . Basophils Relative 09/16/2015 0  % Final  . Smear Review 09/16/2015 Criteria for review not met   Final  . Vitamin B-12 09/16/2015 501  200 - 1,100 pg/mL Final  . Free T4 09/16/2015 1.0  0.8 - 1.8 ng/dL Final  . Cyclic Citrullin Peptide Ab 09/19/2015 <16  Units Final   Comment:   Reference Range Negative               < 20 Weak Positive            20 - 39 Moderate Positive        40 - 59 Strong Positive        > 59   . Proinsulin 09/26/2015 1.7  <=8.0 pmol/L Final   Comment: INTERPRETIVE INFORMATION: Proinsulin, Intact Proinsulin, Intact: Fasting intact proinsulin values above the  reference interval indicate a possible insulin secreting  pancreatic tumor (insulinoma) in patients with hypoglycemia.  Fasting intact proinsulin values range from 3 to 50 pmol/L in  patients with untreated type 2 diabetes.   . Insulin 09/26/2015 4  3 - 19 uIU/mL  Final   Comment: INTERPRETIVE INFORMATION: Insulin, Fasting This test reacts on a nearly equimolar basis with the analogs  insulin aspart, insulin glargine, and insulin lispro.  Insulin  detemir exhibits approximately 50 percent cross-reactivity.  Test  reactivity with insulin glulisine is negligible (less than 3  percent). To convert to pmol/L, multiply uIU/mL by 6.0.   . Proinsulin/Insulin Ratio 09/26/2015 7.1  0.8 - 21.7 % Final   Comment: Performed by [facility], [address], [place] [address] [phone] [email or web address], Julio Delgado, MD, Lab. Director        Lab Results  Component Value Date   TSH 2.70 09/09/2015   TSH 2.82 07/05/2015   TSH 11.38 (H) 05/26/2015   FREET4 1.0 09/16/2015   FREET4 0.7 (L) 05/26/2015   FREET4 1.76 08/17/2014     Review of Systems  Constitutional: Positive for weight gain. Negative for reduced appetite.  Respiratory: Negative for shortness of breath.   Cardiovascular: Negative for leg swelling.  Gastrointestinal: Negative for abdominal pain.       Has had heartburn/reflux  Endocrine: Negative for fatigue.  Musculoskeletal: Positive for back pain.  Neurological: Negative for numbness and tingling.  Psychiatric/Behavioral: Negative for insomnia.    Endocrine: She has had post ablative hypothyroidism, previously had Graves' disease, followed by PCP Her dose has been stable since it was increased in March No unusual fatigue  Lab Results  Component Value Date   TSH 2.70 09/09/2015   TSH 2.82 07/05/2015   TSH 11.38 (H) 05/26/2015   FREET4 1.0 09/16/2015   FREET4 0.7 (L) 05/26/2015   FREET4 1.76 08/17/2014     PHYSICAL EXAM:  BP 120/78   Pulse  61   Ht 5' 6.5" (1.689 m)   Wt 200 lb 9.6 oz (91 kg)   SpO2 99%   BMI 31.89 kg/m   GENERAL:Mildly generalized obesity present   No pallor, clubbing, lymphadenopathy or edema.   Skin:  no rash or pigmentation.  EYES:  Externally show mild prominence without significant lid  retraction or proptosis.    ENT: Oral mucosa and tongue normal.  THYROID:  Not palpable.  HEART:  Normal  S1 and S2; no murmur or click.  CHEST:  Normal shape.  Lungs: Vescicular breath sounds heard equally.  No crepitations/ wheeze.  ABDOMEN:  No distention.  Liver and spleen not palpable.  No other mass or tenderness.  NEUROLOGICAL: .Reflexes are bilaterally normal at ankles.  JOINTS:  Normal.   ASSESSMENT:    Post gastrectomy dumping syndrome with reactive hypoglycemia.  Patient continues to have significant hypoglycemia postprandially despite reducing carbohydrate and increasing protein However she is eating more frequently and the need to precipitate some symptoms with more frequent snacks Currently most of her symptoms are occurring after breakfast and lunch with blood sugars as low as 28 Having low blood sugars almost daily although occasionally may have normal readings are only in the 60s Also she is treating her symptoms inappropriately with food like protein bars   POST ablative hypothyroidism: Adequately replaced with levothyroxine currently   PLAN:    She does not need to have frequent snacks unless going more than about 4 hours between meals.  Trial of Precose 25 mg with breakfast and lunch.  She can try half tablet initially and increase it no GI side effects is discussed.  Advised her to take it right before eating.  May also need to increase the dose further  Also empirically will try her on verapamil ER 180 mg daily to reduce pancreatic insulin secretion  Encouraged her to have less fruit at breakfast, make sure she has enough protein at all meals  To treat hypoglycemic symptoms with glucose tablets or 4 ounces of drinks with sugar  She does not need to check routine blood sugars unless symptomatic and not on waking up Follow-up in 3 weeks  Counseling time on diagnosis, management, glucose monitoring, nutritional counseling, medications = 60  minutes  KUMAR,AJAY 10/28/2015, 10:38 AM

## 2015-10-31 DIAGNOSIS — E161 Other hypoglycemia: Secondary | ICD-10-CM | POA: Insufficient documentation

## 2015-10-31 DIAGNOSIS — K911 Postgastric surgery syndromes: Secondary | ICD-10-CM | POA: Insufficient documentation

## 2015-11-01 DIAGNOSIS — M47816 Spondylosis without myelopathy or radiculopathy, lumbar region: Secondary | ICD-10-CM | POA: Diagnosis not present

## 2015-11-15 MED FILL — ACARBOSE 25 MG TABLET: 25 | 30 days supply | Qty: 60 | Fill #0

## 2015-11-22 ENCOUNTER — Telehealth: Payer: Self-pay | Admitting: *Deleted

## 2015-11-22 NOTE — Telephone Encounter (Signed)
Pt requesting an appt for Friday afternoon, states she has a "place" on her bottom that causes pain when sitting, and she feels its getting worse.

## 2015-11-22 NOTE — Telephone Encounter (Signed)
She may come at 4pm Friday but ONLY to address that problem and nothing else.

## 2015-11-22 NOTE — Telephone Encounter (Signed)
Spoke with patient.  Gave appointment for 4pm on Friday, 9/29.  Patient advised that we are limited on time and we will only be able to address the issue of the carbuncle on her buttocks on Friday.   States she's seeing her Endocrinologist on Friday morning. Patient confirmed appointment.

## 2015-11-25 ENCOUNTER — Encounter: Payer: Self-pay | Admitting: Endocrinology

## 2015-11-25 ENCOUNTER — Encounter: Payer: Self-pay | Admitting: Internal Medicine

## 2015-11-25 ENCOUNTER — Ambulatory Visit (INDEPENDENT_AMBULATORY_CARE_PROVIDER_SITE_OTHER): Payer: 59 | Admitting: Internal Medicine

## 2015-11-25 ENCOUNTER — Ambulatory Visit (INDEPENDENT_AMBULATORY_CARE_PROVIDER_SITE_OTHER): Payer: 59 | Admitting: Endocrinology

## 2015-11-25 VITALS — BP 108/72 | HR 98 | Temp 97.9°F | Resp 14 | Ht 66.5 in | Wt 199.6 lb

## 2015-11-25 VITALS — BP 118/84 | HR 67 | Temp 97.5°F | Wt 198.0 lb

## 2015-11-25 DIAGNOSIS — Z9884 Bariatric surgery status: Secondary | ICD-10-CM

## 2015-11-25 DIAGNOSIS — E162 Hypoglycemia, unspecified: Secondary | ICD-10-CM

## 2015-11-25 DIAGNOSIS — Z9889 Other specified postprocedural states: Secondary | ICD-10-CM | POA: Diagnosis not present

## 2015-11-25 DIAGNOSIS — S30810A Abrasion of lower back and pelvis, initial encounter: Secondary | ICD-10-CM

## 2015-11-25 DIAGNOSIS — E161 Other hypoglycemia: Secondary | ICD-10-CM

## 2015-11-25 MED ORDER — DOXYCYCLINE HYCLATE 100 MG PO TABS: 100.0000 mg | ORAL_TABLET | Freq: Two times a day (BID) | ORAL | 1 refills | Status: DC

## 2015-11-25 MED FILL — DOXYCYCLINE HYCLATE 100 MG: 100 | 10 days supply | Qty: 20 | Fill #0

## 2015-11-25 NOTE — Progress Notes (Signed)
Patient ID: Victoria [name], female   DOB: 1960/10/12, 55 y.o.   MRN: [id number]            Chief complaint: Low blood sugar episodes  History of Present Illness:  Background history:  Around May 2017 the patient started having symptoms of episodic sweating and shakiness along with the sensation of nearly passing out. She initially went to her gynecologist thinking it was hot flashes. Subsequently patient had more significant symptoms   She tends to have low blood sugars within 1-2 hours of her meals but particularly after breakfast and lunch and usually not after supper  Has not had any overnight low blood sugars except once after midnight Fasting blood sugars: Usually low normal, recent range 66-88 Postprandial readings prior to her first visit: 46-77 after breakfast and 34-97 after lunch from her Accu-Chek monitor download  RECENT history:  Because of her very frequent symptoms and almost daily hypoglycemia she was started on Precose 25 mg before breakfast and lunch and verapamil ER 180 mg at dinnertime  With this her symptoms have gradually improved and her hypoglycemia has become progressively less frequent Accu-Chek Review: Since about 9/17 she has had only 2 blood sugars that are below 65, once after breakfast and once at about 12:30 PM She is checking fasting readings and recently these range from 74-87  She is treating low blood sugar symptoms with eating a peppermint candy or having grapes or glucose tablets She has had relief of symptoms fairly quickly  Her breakfast is usually [place] yogurt, protein bar or protein shake At lunch may sometimes have chili from [facility], dinner is usually low in carbohydrate She is not eating excessive snacks now and does not feel hungry in between Initially had some GI side effects from Precose but is doing well with this now   Weight history: Prior to her gastric bypass surgery her weight was 291  Wt Readings from Last 3 Encounters:    11/25/15 199 lb 9.6 oz (90.5 kg)  10/28/15 200 lb 9.6 oz (91 kg)  09/16/15 200 lb (90.7 kg)    Am 80s occ 70s   Past Medical History:  Diagnosis Date  . C. difficile diarrhea 10/2010   Infection occurred after father had infection  . Complication of anesthesia    HAD DIFFICULTY SWOLLING SOLID FOOD AFTER INTUBATION FOR PAST SURG  . Degenerative joint disease of low back 09/02/15    Dr. Maureen Ralphs  . GERD (gastroesophageal reflux disease)   . Headache    HX MIGRAINES  . Hemorrhoids   . Hiatal hernia   . History of transfusion   . HSV-2 infection   . Hx of adenomatous colonic polyps 10/17/07  . Hydradenitis   . Hypothyroidism   . Obesity   . Osteoarthritis    left hip  . Pernicious anemia   . PONV (postoperative nausea and vomiting)   . Varicose veins   . Vitamin D deficiency     Past Surgical History:  Procedure Laterality Date  . ABDOMINAL HYSTERECTOMY  2004   ovaries remain  . BREATH TEK H PYLORI N/A 05/03/2014   Procedure: BREATH TEK H PYLORI;  Surgeon: Kaylyn Lim, MD;  Location: [facility];  Service: General;  Laterality: N/A;  . [place]  . CHOLECYSTECTOMY  1987  . GASTRIC ROUX-EN-Y N/A 07/06/2014   Procedure: LAPAROSCOPIC ROUX-EN-Y GASTRIC BYPASS, ENTEROLYSIS OF ADHESIONS WITH UPPER ENDOSCOPY;  Surgeon: Johnathan Hausen, MD;  Location: [facility];  Service: General;  Laterality:  N/A;  . HAMMER TOE SURGERY    . HIATAL HERNIA REPAIR N/A 07/06/2014   Procedure: LAPAROSCOPIC REPAIR OF HIATAL HERNIA;  Surgeon: Johnathan Hausen, MD;  Location: [facility];  Service: General;  Laterality: N/A;  . HIP ARTHROPLASTY  01/31/09   left  . hsv 2    . JOINT REPLACEMENT  01/2009   l hip (BILATERAL TOTAL HIPS)  . REFRACTIVE SURGERY    . THYROIDECTOMY  1980  . TOTAL HIP ARTHROPLASTY Right 05/21/2012   Procedure: TOTAL HIP ARTHROPLASTY;  Surgeon: Gearlean Alf, MD;  Location: [facility];  Service: Orthopedics;  Laterality: Right;    Family History  Problem Relation Age of Onset   . Diabetes Father   . Diabetes Sister   . Hypertension Sister   . Hypertension Mother     Social History:  reports that she has never smoked. She has never used smokeless tobacco. She reports that she does not drink alcohol or use drugs.  Allergies: No Known Allergies    Medication List       Accurate as of 11/25/15 10:09 AM. Always use your most recent med list.          acarbose 25 MG tablet Commonly known as:  PRECOSE 1 tablet at the start of breakfast and supper   ACCU-CHEK AVIVA PLUS w/Device Kit   ACCU-CHEK SOFTCLIX LANCETS lancets   ALPRAZolam 0.5 MG tablet Commonly known as:  XANAX TAKE 1 TABLET BY MOUTH AT BEDTIME   AMOXICILLIN PO Take 2,000 mg by mouth. Prior to dental visits   CALCIUM-VITAMIN D3 PO Take 15 mLs by mouth.   cyanocobalamin 1000 MCG/ML injection Commonly known as:  (VITAMIN B-12) INJECT 1 ML INTRAMUSCULARLY EVERY MONTH   cyclobenzaprine 10 MG tablet Commonly known as:  FLEXERIL TAKE 1 TABLET BY MOUTH AT BEDTIME   estradiol 0.1 MG/24HR patch Commonly known as:  VIVELLE-DOT Place 1 patch (0.1 mg total) onto the skin 2 (two) times a week.   glucose blood test strip Commonly known as:  ACCU-CHEK AVIVA PLUS Use for testing glucose up to 8 times a day prn   HYDROcodone-acetaminophen 10-325 MG tablet Commonly known as:  NORCO Take 1 tablet by mouth every 8 (eight) hours as needed.   levothyroxine 100 MCG tablet Commonly known as:  SYNTHROID, LEVOTHROID Take 1 tablet (100 mcg total) by mouth daily.   MULTIVITAMIN PO Take 30 mLs by mouth.   pantoprazole 40 MG tablet Commonly known as:  PROTONIX Take 1 tablet (40 mg total) by mouth 2 (two) times daily.   promethazine 25 MG tablet Commonly known as:  PHENERGAN TAKE 1 TABLET BY MOUTH EVERY 8 HOURS AS NEEDED FOR NAUSEA AND VOMITING   valACYclovir 500 MG tablet Commonly known as:  VALTREX TAKE 1 TABLET BY MOUTH ONCE DAILY   verapamil 180 MG CR tablet Commonly known as:   CALAN-SR Take 1 tablet (180 mg total) by mouth daily with supper.       LABS:  No visits with results within 1 Week(s) from this visit.  Latest known visit with results is:  Office Visit on 09/16/2015  Component Date Value Ref Range Status  . WBC 09/16/2015 4.4  3.8 - 10.8 K/uL Final  . RBC 09/16/2015 3.80  3.80 - 5.10 MIL/uL Final  . Hemoglobin 09/16/2015 12.3  11.7 - 15.5 g/dL Final  . HCT 09/16/2015 37.1  35.0 - 45.0 % Final  . MCV 09/16/2015 97.6  80.0 - 100.0 fL Final  . [facility] 09/16/2015  32.4  27.0 - 33.0 pg Final  . MCHC 09/16/2015 33.2  32.0 - 36.0 g/dL Final  . RDW 09/16/2015 13.8  11.0 - 15.0 % Final  . Platelets 09/16/2015 227  140 - 400 K/uL Final  . MPV 09/16/2015 11.5  7.5 - 12.5 fL Final  . Neutro Abs 09/16/2015 2376  1,500 - 7,800 cells/uL Final  . Lymphs Abs 09/16/2015 1716  850 - 3,900 cells/uL Final  . Monocytes Absolute 09/16/2015 220  200 - 950 cells/uL Final  . Eosinophils Absolute 09/16/2015 88  15 - 500 cells/uL Final  . Basophils Absolute 09/16/2015 0  0 - 200 cells/uL Final  . Neutrophils Relative % 09/16/2015 54  % Final  . Lymphocytes Relative 09/16/2015 39  % Final  . Monocytes Relative 09/16/2015 5  % Final  . Eosinophils Relative 09/16/2015 2  % Final  . Basophils Relative 09/16/2015 0  % Final  . Smear Review 09/16/2015 Criteria for review not met   Final  . Vitamin B-12 09/16/2015 501  200 - 1,100 pg/mL Final  . Free T4 09/16/2015 1.0  0.8 - 1.8 ng/dL Final  . Cyclic Citrullin Peptide Ab 09/19/2015 <16  Units Final   Comment:   Reference Range Negative               < 20 Weak Positive            20 - 39 Moderate Positive        40 - 59 Strong Positive        > 59   . Proinsulin 09/26/2015 1.7  <=8.0 pmol/L Final   Comment: INTERPRETIVE INFORMATION: Proinsulin, Intact Proinsulin, Intact: Fasting intact proinsulin values above the  reference interval indicate a possible insulin secreting  pancreatic tumor (insulinoma) in patients with  hypoglycemia.  Fasting intact proinsulin values range from 3 to 50 pmol/L in  patients with untreated type 2 diabetes.   . Insulin 09/26/2015 4  3 - 19 uIU/mL Final   Comment: INTERPRETIVE INFORMATION: Insulin, Fasting This test reacts on a nearly equimolar basis with the analogs  insulin aspart, insulin glargine, and insulin lispro.  Insulin  detemir exhibits approximately 50 percent cross-reactivity.  Test  reactivity with insulin glulisine is negligible (less than 3  percent). To convert to pmol/L, multiply uIU/mL by 6.0.   . Proinsulin/Insulin Ratio 09/26/2015 7.1  0.8 - 21.7 % Final   Comment: Performed by [facility], [address], [place] [address] [phone] [email or web address], Julio Delgado, MD, Lab. Director        Lab Results  Component Value Date   TSH 2.70 09/09/2015   TSH 2.82 07/05/2015   TSH 11.38 (H) 05/26/2015   FREET4 1.0 09/16/2015   FREET4 0.7 (L) 05/26/2015   FREET4 1.76 08/17/2014     Review of Systems  Endocrine: She has had post ablative hypothyroidism, previously had Graves' disease, followed by PCP Her dose has been stable since it was increased in March No unusual fatigue  Lab Results  Component Value Date   TSH 2.70 09/09/2015   TSH 2.82 07/05/2015   TSH 11.38 (H) 05/26/2015   FREET4 1.0 09/16/2015   FREET4 0.7 (L) 05/26/2015   FREET4 1.76 08/17/2014     PHYSICAL EXAM:  BP 108/72   Pulse 98   Temp 97.9 F (36.6 C)   Resp 14   Ht 5' 6.5" (1.689 m)   Wt 199 lb 9.6 oz (90.5 kg)   SpO2 99%   BMI 31.73 kg/m  ASSESSMENT:    Post gastrectomy dumping syndrome with reactive hypoglycemia.    She has had progressive improvement in her reactive hypoglycemia with starting Precose and also verapamil She had difficulty getting the Precose consistently from the pharmacy but now is taking it regularly before breakfast and lunch as directed Her GI intolerance with mild diarrhea has resolved No constipation with verapamil  She  is also doing fairly well with diet with trying to reduce carbohydrates, sweets and add protein to every meal Also treating hypoglycemia appropriately now with only one low sugar per week in the last 2 weeks    PLAN:    She will continue her regimen unchanged including Precose  Call if having excessive low sugars Also she will let us know if she gets lightheaded with the verapamil, not clear if this is benefiting but would rather not stop it at this point  She does not need to check routine blood sugars unless symptomatic and not on waking up Follow-up in 3 months   KUMAR,AJAY 11/25/2015, 10:09 AM

## 2015-11-25 NOTE — Progress Notes (Signed)
Thanks so very much- knew you would know what to do!

## 2015-11-25 NOTE — Progress Notes (Signed)
   Subjective:    Patient ID: Victoria [name], female    DOB: Oct 27, 1960, 55 y.o.   MRN: [id number]  HPI  55 year old Black female with irritation right buttock. Concerned about possible carbuncle. Has had Hidradenitis suppurativa in the remote past. Recent issues s/p gastric bypass surgery with dumping syndrome evaluated and treated successfully by Dr. Dwyane Dee with Verapamil and precose. Seldom has hypoglycemic episodes now.    Review of Systems  as above      Objective:   Physical Exam She has 3 separate excoriations right posterior buttock and upper thigh. No evidence of purulent drainage but there are superficial excoriations that are bleeding.  No palpable carbuncle under these excoriations. Area is tender however.       Assessment & Plan:  Buttock excoriations-doubt carbuncle  Plan: Sitz baths recommended. Apply Bactroban twice daily. Doxycycline 100 mg twice daily for 10 days with one refill.

## 2015-11-25 NOTE — Patient Instructions (Addendum)
Apply Bactroban ointment to buttock area twice daily.Doxycycline 100 mg twice daily for 10 days with one refill. Sitz baths recommended.

## 2015-11-28 ENCOUNTER — Telehealth: Payer: Self-pay | Admitting: Internal Medicine

## 2015-11-28 NOTE — Telephone Encounter (Signed)
Sitz baths. Continue oral antibiotic

## 2015-11-28 NOTE — Telephone Encounter (Signed)
Pt informed, states understanding, no further questions.

## 2015-11-28 NOTE — Telephone Encounter (Signed)
She's been putting Bactroban on the buttocks area and taking the Doxycycline since Friday and the area has been draining and bleeding.  States that the area now has an "odor" to it that has "changed".  Wants to know if she should be concerned because of the "odor"??  And, wants to know if she should be doing anything different at home?  Said she's just concerned since this changed since she was in.    Please advise.

## 2015-11-30 MED FILL — VERAPAMIL ER 180 MG TABLET: 180 | 30 days supply | Qty: 30 | Fill #1

## 2015-12-05 ENCOUNTER — Encounter: Payer: 59 | Attending: Internal Medicine | Admitting: Dietician

## 2015-12-05 ENCOUNTER — Encounter: Payer: Self-pay | Admitting: Dietician

## 2015-12-05 DIAGNOSIS — M549 Dorsalgia, unspecified: Secondary | ICD-10-CM | POA: Insufficient documentation

## 2015-12-05 DIAGNOSIS — M1990 Unspecified osteoarthritis, unspecified site: Secondary | ICD-10-CM | POA: Diagnosis not present

## 2015-12-05 DIAGNOSIS — K219 Gastro-esophageal reflux disease without esophagitis: Secondary | ICD-10-CM | POA: Insufficient documentation

## 2015-12-05 DIAGNOSIS — Z79899 Other long term (current) drug therapy: Secondary | ICD-10-CM | POA: Diagnosis not present

## 2015-12-05 DIAGNOSIS — E6609 Other obesity due to excess calories: Secondary | ICD-10-CM

## 2015-12-05 DIAGNOSIS — Z6831 Body mass index (BMI) 31.0-31.9, adult: Secondary | ICD-10-CM | POA: Insufficient documentation

## 2015-12-05 NOTE — Patient Instructions (Addendum)
Goals:  -Continue to follow up with Dr. Dwyane Dee and Dr. Hassell Done -Continue to include carbs+protein with each meal and snack -Keep trying to observe a pattern with types of foods, timing, and blood sugar -Keep juice on hand for treatment of low blood sugars

## 2015-12-05 NOTE — Progress Notes (Signed)
  Follow-up visit: 1.5 years Post-Operative RYGB Surgery  Medical Nutrition Therapy:  Appt start time: 400 End time: 445  Primary concerns today: Post-operative Bariatric Surgery Nutrition Management.  Victoria [name] returns today for a nutrition follow up. She has been struggling with low blood glucose levels for the past several months. Recently started taking Acarbose and Verapamil to "help maintain blood sugars." States that she feels like this may be helping because her blood sugars are running more in the 80s. Still having symptomatic low blood sugars between meals. Has not any blood sugars lower than the 50s. Highest reading 100 mg/dL. Saw Dr. Hassell Done (bariatric surgeon) in September and plans to follow up with him again at the first of the year.  Also following up with Dr. Dwyane Dee (endocrinologist) at the first of next year. She states she still has not been able to observe a pattern with meals and blood sugars. Fluid intake varies from day to day.  Patient reports that Dr. Dwyane Dee advised that she only test blood sugars if she has symptoms and to treat only sugars below 60 mg/dL. Victoria [name] reports not wanting to eat a lot of meat. Provided vegetarian proteins handout.   Surgery date: 07/06/2014 Surgery type: RYGB Start weight at [facility]: 286 lbs on 05/01/14 (highest weight 291 per patient) Weight today: 202.2 lbs Weight change: 6 lbs gain  TANITA  BODY COMP RESULTS  06/21/14 07/20/14 08/26/14 09/27/14 11/10/14 12/21/14 03/09/15 06/24/15 09/09/15 09/22/15 12/05/15   BMI (kg/m^2) 45.9 42.4 39.8 38.8 35.1 34.1 31.9 31.2 31.2 NA 32.1   Fat Mass (lbs) 154.5 143.5 126 110 98 92.5 81 81.5 86.4  81.8   Fat Free Mass (lbs) 134 123.0 124.5 134 122.5 122 119.5 115 110  120.4   Total Body Water (lbs) 98 90.0 91 98 89.5 89.5 87.5 84 78.6  86    Preferred Learning Style:   No preference indicated   Learning Readiness:   Ready  Today 10/9:  Breakfast (6am): protein shake (5g carbs) Snack (9am): [place] yogurt and protein  bar (30 grams carbs) Lunch (12:40pm): [place] yogurt BG 51 mg/dL at 2pm Snack (2:05 pm): cheese crackers Dinner (6 pm): 2 fried chicken wings, fried okra, 1/4 cup stewed potatoes OR chicken pot pie   Medications: see list Supplementation: taking liquid form, sometimes forgets Calcium  Using straws: yes, doesn't feel like like it adds gas Drinking while eating: no Hair loss: some Carbonated beverages: no N/V/D/C: some nausea Dumping syndrome: none  Recent physical activity:  None (back injury)  Progress Towards Goal(s):  In progress.    Nutritional Diagnosis:  [place]-3.3 Overweight/obesity related to past poor dietary habits and physical inactivity as evidenced by patient w/ recent RYGB surgery following dietary guidelines for continued weight loss.     Intervention:  Nutrition counseling provided. Goals: -Continue to follow up with Dr. Dwyane Dee and Dr. Hassell Done -Continue to include carbs+protein with each meal and snack -Keep trying to observe a pattern with types of foods, timing, and blood sugar -Keep juice on hand for treatment of low blood sugars   Teaching Method Utilized:  Visual Auditory Hands on  Barriers to learning/adherence to lifestyle change: taste changes  Demonstrated degree of understanding via:  Teach Back   Monitoring/Evaluation:  Dietary intake, exercise, and body weight. Follow up in 3 months.

## 2015-12-08 MED FILL — ACARBOSE 25 MG TABLET: 25 | 30 days supply | Qty: 60 | Fill #1

## 2015-12-13 ENCOUNTER — Ambulatory Visit (INDEPENDENT_AMBULATORY_CARE_PROVIDER_SITE_OTHER): Payer: 59 | Admitting: Internal Medicine

## 2015-12-13 ENCOUNTER — Encounter: Payer: Self-pay | Admitting: Internal Medicine

## 2015-12-13 VITALS — BP 128/84 | HR 62 | Temp 97.6°F | Wt 199.0 lb

## 2015-12-13 DIAGNOSIS — J040 Acute laryngitis: Secondary | ICD-10-CM

## 2015-12-13 MED ORDER — AZITHROMYCIN 250 MG PO TABS: ORAL_TABLET | ORAL | 0 refills | Status: DC

## 2015-12-13 MED ORDER — HYDROCODONE-HOMATROPINE 5-1.5 MG/5ML PO SYRP: 5.0000 mL | ORAL_SOLUTION | Freq: Three times a day (TID) | ORAL | 0 refills | Status: DC | PRN

## 2015-12-13 MED FILL — HYDROCODONE-HOMATROPINE SYR: 5-1.5 | 8 days supply | Qty: 120 | Fill #0

## 2015-12-13 MED FILL — AZITHROMYCIN 250 MG TABLET: 250 | 5 days supply | Qty: 6 | Fill #0

## 2015-12-13 NOTE — Progress Notes (Signed)
   Subjective:    Patient ID: Victoria [name], female    DOB: September 22, 1960, 55 y.o.   MRN: [id number]  HPI  She is in today with acute laryngitis that had onset yesterday. She's had no fever or chills. Just started suddenly to lose her voice. No sore throat.               Review of Systems the above     Objective:   Physical Exam  Pharynx slightly injected. TMs slightly full bilaterally but not red. Neck is supple without adenopathy. Chest clear to auscultation.      Assessment & Plan:  Acute laryngitis  Plan:Zithromax Z-Pak take 2 tablets day one followed by 1 tablet days 2 through 5. Hycodan 1 teaspoon by mouth every 8 hours when necessary cough.

## 2015-12-16 ENCOUNTER — Ambulatory Visit (INDEPENDENT_AMBULATORY_CARE_PROVIDER_SITE_OTHER): Payer: 59 | Admitting: Internal Medicine

## 2015-12-16 ENCOUNTER — Encounter: Payer: Self-pay | Admitting: Internal Medicine

## 2015-12-16 DIAGNOSIS — Z23 Encounter for immunization: Secondary | ICD-10-CM

## 2015-12-16 NOTE — Progress Notes (Signed)
Flu vaccine given.

## 2015-12-20 MED FILL — ALPRAZolam 0.5 MG TABS: 0.5 | 30 days supply | Qty: 30 | Fill #2

## 2015-12-20 MED FILL — PANTOPRAZOLE SOD DR 40 MG T: 40 | 30 days supply | Qty: 60 | Fill #2

## 2015-12-25 NOTE — Patient Instructions (Addendum)
Hycodan 1 teaspoon by mouth every 8 hours as needed for cough. Take Zithromax Z-PAK as directed.

## 2016-01-06 ENCOUNTER — Other Ambulatory Visit: Payer: Self-pay | Admitting: Internal Medicine

## 2016-01-06 ENCOUNTER — Other Ambulatory Visit: Payer: Self-pay | Admitting: Endocrinology

## 2016-01-06 MED FILL — VERAPAMIL ER 180 MG TABLET: 180 | 30 days supply | Qty: 30 | Fill #0

## 2016-01-06 MED FILL — ACARBOSE 25 MG TABLET: 25 | 30 days supply | Qty: 60 | Fill #2

## 2016-01-06 MED FILL — LEVOTHYROXINE 100 MCG TAB: 100 | 90 days supply | Qty: 90 | Fill #0

## 2016-01-12 MED FILL — DOXYCYCLINE HYCLATE 100 MG: 100 | 10 days supply | Qty: 20 | Fill #1

## 2016-02-10 ENCOUNTER — Other Ambulatory Visit: Payer: Self-pay | Admitting: Endocrinology

## 2016-02-10 MED FILL — VERAPAMIL ER 180 MG TABLET: 180 | 30 days supply | Qty: 30 | Fill #1

## 2016-02-10 MED FILL — ACARBOSE 25 MG TABLET: 25 | 30 days supply | Qty: 60 | Fill #0

## 2016-03-08 ENCOUNTER — Other Ambulatory Visit: Payer: 59 | Admitting: Internal Medicine

## 2016-03-08 ENCOUNTER — Ambulatory Visit: Payer: 59 | Admitting: Endocrinology

## 2016-03-12 ENCOUNTER — Ambulatory Visit (INDEPENDENT_AMBULATORY_CARE_PROVIDER_SITE_OTHER): Payer: 59 | Admitting: Obstetrics and Gynecology

## 2016-03-12 ENCOUNTER — Ambulatory Visit (INDEPENDENT_AMBULATORY_CARE_PROVIDER_SITE_OTHER): Payer: 59 | Admitting: Internal Medicine

## 2016-03-12 ENCOUNTER — Encounter: Payer: Self-pay | Admitting: Endocrinology

## 2016-03-12 ENCOUNTER — Encounter: Payer: Self-pay | Admitting: Obstetrics and Gynecology

## 2016-03-12 ENCOUNTER — Ambulatory Visit (INDEPENDENT_AMBULATORY_CARE_PROVIDER_SITE_OTHER): Payer: 59 | Admitting: Endocrinology

## 2016-03-12 ENCOUNTER — Encounter: Payer: Self-pay | Admitting: Internal Medicine

## 2016-03-12 ENCOUNTER — Other Ambulatory Visit: Payer: Self-pay | Admitting: Internal Medicine

## 2016-03-12 VITALS — BP 102/66 | HR 66 | Ht 66.0 in | Wt 202.0 lb

## 2016-03-12 VITALS — BP 104/66 | HR 78 | Ht 66.5 in | Wt 205.0 lb

## 2016-03-12 VITALS — BP 100/70 | HR 88 | Resp 16 | Wt 203.0 lb

## 2016-03-12 DIAGNOSIS — Z0000 Encounter for general adult medical examination without abnormal findings: Secondary | ICD-10-CM | POA: Diagnosis not present

## 2016-03-12 DIAGNOSIS — N898 Other specified noninflammatory disorders of vagina: Secondary | ICD-10-CM

## 2016-03-12 DIAGNOSIS — E038 Other specified hypothyroidism: Secondary | ICD-10-CM

## 2016-03-12 DIAGNOSIS — E161 Other hypoglycemia: Secondary | ICD-10-CM | POA: Diagnosis not present

## 2016-03-12 DIAGNOSIS — Z79890 Hormone replacement therapy (postmenopausal): Secondary | ICD-10-CM

## 2016-03-12 DIAGNOSIS — K911 Postgastric surgery syndromes: Secondary | ICD-10-CM | POA: Diagnosis not present

## 2016-03-12 DIAGNOSIS — Z13220 Encounter for screening for lipoid disorders: Secondary | ICD-10-CM | POA: Diagnosis not present

## 2016-03-12 DIAGNOSIS — R1013 Epigastric pain: Secondary | ICD-10-CM

## 2016-03-12 DIAGNOSIS — E039 Hypothyroidism, unspecified: Secondary | ICD-10-CM

## 2016-03-12 DIAGNOSIS — E538 Deficiency of other specified B group vitamins: Secondary | ICD-10-CM | POA: Diagnosis not present

## 2016-03-12 DIAGNOSIS — N951 Menopausal and female climacteric states: Secondary | ICD-10-CM | POA: Diagnosis not present

## 2016-03-12 DIAGNOSIS — R0600 Dyspnea, unspecified: Secondary | ICD-10-CM

## 2016-03-12 DIAGNOSIS — R8290 Unspecified abnormal findings in urine: Secondary | ICD-10-CM

## 2016-03-12 DIAGNOSIS — E559 Vitamin D deficiency, unspecified: Secondary | ICD-10-CM

## 2016-03-12 LAB — COMPREHENSIVE METABOLIC PANEL
ALT: 32 U/L — ABNORMAL HIGH (ref 6–29)
AST: 25 U/L (ref 10–35)
Albumin: 4 g/dL (ref 3.6–5.1)
Alkaline Phosphatase: 49 U/L (ref 33–130)
BUN: 14 mg/dL (ref 7–25)
CO2: 25 mmol/L (ref 20–31)
Calcium: 9.3 mg/dL (ref 8.6–10.4)
Chloride: 104 mmol/L (ref 98–110)
Creat: 0.95 mg/dL (ref 0.50–1.05)
Glucose, Bld: 84 mg/dL (ref 65–99)
Potassium: 4.6 mmol/L (ref 3.5–5.3)
Sodium: 139 mmol/L (ref 135–146)
Total Bilirubin: 0.4 mg/dL (ref 0.2–1.2)
Total Protein: 7.3 g/dL (ref 6.1–8.1)

## 2016-03-12 LAB — CBC WITH DIFFERENTIAL/PLATELET
Basophils Absolute: 0 cells/uL (ref 0–200)
Basophils Relative: 0 %
Eosinophils Absolute: 138 cells/uL (ref 15–500)
Eosinophils Relative: 6 %
HCT: 40.1 % (ref 35.0–45.0)
Hemoglobin: 13.1 g/dL (ref 11.7–15.5)
Lymphocytes Relative: 48 %
Lymphs Abs: 1104 cells/uL (ref 850–3900)
MCH: 32.4 pg (ref 27.0–33.0)
MCHC: 32.7 g/dL (ref 32.0–36.0)
MCV: 99.3 fL (ref 80.0–100.0)
MPV: 10.8 fL (ref 7.5–12.5)
Monocytes Absolute: 184 cells/uL — ABNORMAL LOW (ref 200–950)
Monocytes Relative: 8 %
Neutro Abs: 874 cells/uL — ABNORMAL LOW (ref 1500–7800)
Neutrophils Relative %: 38 %
Platelets: 230 10*3/uL (ref 140–400)
RBC: 4.04 MIL/uL (ref 3.80–5.10)
RDW: 13.5 % (ref 11.0–15.0)
WBC: 2.3 10*3/uL — ABNORMAL LOW (ref 3.8–10.8)

## 2016-03-12 LAB — POC URINALSYSI DIPSTICK (AUTOMATED)
Glucose, UA: NEGATIVE
Ketones, UA: NEGATIVE
Leukocytes, UA: NEGATIVE
Protein, UA: NEGATIVE
Spec Grav, UA: 1.01
Urobilinogen, UA: NEGATIVE
pH, UA: 7

## 2016-03-12 LAB — LIPID PANEL
Cholesterol: 183 mg/dL (ref <200)
HDL: 88 mg/dL (ref >50)
LDL Cholesterol: 86 mg/dL (ref <100)
Total CHOL/HDL Ratio: 2.1 Ratio (ref <5.0)
Triglycerides: 47 mg/dL (ref <150)
VLDL: 9 mg/dL (ref <30)

## 2016-03-12 LAB — URINALYSIS, MICROSCOPIC ONLY
Bacteria, UA: NONE SEEN [HPF]
Casts: NONE SEEN [LPF]
Crystals: NONE SEEN [HPF]
WBC, UA: NONE SEEN WBC/HPF (ref <=5)
Yeast: NONE SEEN [HPF]

## 2016-03-12 LAB — VITAMIN B12: Vitamin B-12: 548 pg/mL (ref 200–1100)

## 2016-03-12 MED ORDER — ESTRADIOL 0.1 MG/24HR TD PTWK: 0.1000 mg | MEDICATED_PATCH | TRANSDERMAL | 1 refills | Status: DC

## 2016-03-12 MED FILL — CYANOCOBALAMIN 1,000 MCG/ML: 1000 | 84 days supply | Qty: 3 | Fill #1

## 2016-03-12 MED FILL — ACARBOSE 25 MG TABLET: 25 | 30 days supply | Qty: 60 | Fill #1

## 2016-03-12 MED FILL — ESTRADIOL 0.1 MG/DAY PATCH: 0.1 | 28 days supply | Qty: 4 | Fill #0

## 2016-03-12 NOTE — Progress Notes (Signed)
GYNECOLOGY  VISIT   HPI: 56 y.o.   Married  [place] American  female   [phone] with No LMP recorded. Patient has had a hysterectomy.   here c/o yellow vaginal discharge with odor. She has had the discharge for the last year or so, getting worse. Yellow. Has an odor, not fishy. No itching, burning or irritation.  Not sexually active for 2 years.  She was given the vivelle dot by Dr Sabra Heck last summer. She broke out in a local rash and stopped taking it. She switched back to oral estrogen (41m a day)given to her from her primary. She has a h/o gastric bypass. She is having hot flashes, needs another estrogen script. Willing to try a different patch.  She is having issues with hypoglycemia, seeing an Endocrinologist. Glucose getting into the 30's.   RN for [employer]   GYNECOLOGIC HISTORY: No LMP recorded. Patient has had a hysterectomy. Contraception:hysterectomy  Menopausal hormone therapy: none         OB History    Gravida Para Term Preterm AB Living   2 1   1 1 2    SAB TAB Ectopic Multiple Live Births     1   1 2          Patient Active Problem List   Diagnosis Date Noted  . 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 Lap gastric bypass May 2016 07/06/2014  . Chest pain 03/11/2013  . History of Clostridium difficile infection 08/12/2011  . Hypothyroidism, postsurgical 08/26/2010  . GE reflux 08/26/2010  . Vitamin D deficiency 08/26/2010  . Fibrocystic breast disease 08/26/2010  . B12 deficiency 08/26/2010  . OBESITY, UNSPECIFIED 03/20/2010  . BACK PAIN 03/20/2010    Past Medical History:  Diagnosis Date  . C. difficile diarrhea 10/2010   Infection occurred after father had infection  . Complication of anesthesia    HAD DIFFICULTY SWOLLING SOLID FOOD AFTER INTUBATION FOR PAST SURG  . Degenerative joint disease of low back 09/02/15    Dr. AMaureen Ralphs . GERD (gastroesophageal reflux disease)   . Headache    HX MIGRAINES  . Hemorrhoids   .  Hiatal hernia   . History of transfusion   . HSV-2 infection   . Hx of adenomatous colonic polyps 10/17/07  . Hydradenitis   . Hypothyroidism   . Obesity   . Osteoarthritis    left hip  . Pernicious anemia   . PONV (postoperative nausea and vomiting)   . Varicose veins   . Vitamin D deficiency     Past Surgical History:  Procedure Laterality Date  . ABDOMINAL HYSTERECTOMY  2004   ovaries remain  . BREATH TEK H PYLORI N/A 05/03/2014   Procedure: BREATH TEK H PYLORI;  Surgeon: MKaylyn Lim MD;  [facility];  Service: General;  Laterality: N/A;  . [place] . CHOLECYSTECTOMY  1987  . GASTRIC ROUX-EN-Y N/A 07/06/2014   Procedure: LAPAROSCOPIC ROUX-EN-Y GASTRIC BYPASS, ENTEROLYSIS OF ADHESIONS WITH UPPER ENDOSCOPY;  Surgeon: MJohnathan Hausen MD;  Location: [facility];  Service: General;  Laterality: N/A;  . HAMMER TOE SURGERY    . HIATAL HERNIA REPAIR N/A 07/06/2014   Procedure: LAPAROSCOPIC REPAIR OF HIATAL HERNIA;  Surgeon: MJohnathan Hausen MD;  Location: [facility];  Service: General;  Laterality: N/A;  . HIP ARTHROPLASTY  01/31/09   left  . hsv 2    . JOINT REPLACEMENT  01/2009   l hip (BILATERAL TOTAL HIPS)  .  REFRACTIVE SURGERY    . THYROIDECTOMY  1980  . TOTAL HIP ARTHROPLASTY Right 05/21/2012   Procedure: TOTAL HIP ARTHROPLASTY;  Surgeon: Gearlean Alf, MD;  Location: [facility];  Service: Orthopedics;  Laterality: Right;    Current Outpatient Prescriptions  Medication Sig Dispense Refill  . acarbose (PRECOSE) 25 MG tablet TAKE 1 TABLET BY MOUTH AT THE START OF BREAKFAST AND SUPPER 60 tablet 2  . ACCU-CHEK SOFTCLIX LANCETS lancets   0  . ALPRAZolam (XANAX) 0.5 MG tablet TAKE 1 TABLET BY MOUTH AT BEDTIME 30 tablet 5  . Blood Glucose Monitoring Suppl (ACCU-CHEK AVIVA PLUS) w/Device KIT   0  . Calcium Carbonate-Vitamin D (CALCIUM-VITAMIN D3 PO) Take 15 mLs by mouth.    . cyanocobalamin (,VITAMIN B-12,) 1000 MCG/ML injection INJECT 1 ML INTRAMUSCULARLY EVERY MONTH  25 mL 0  . cyclobenzaprine (FLEXERIL) 10 MG tablet TAKE 1 TABLET BY MOUTH AT BEDTIME 30 tablet 3  . glucose blood (ACCU-CHEK AVIVA PLUS) test strip Use for testing glucose up to 8 times a day prn 360 each 1  . HYDROcodone-acetaminophen (NORCO) 10-325 MG tablet Take 1 tablet by mouth every 8 (eight) hours as needed. 60 tablet 0  . levothyroxine (SYNTHROID, LEVOTHROID) 100 MCG tablet TAKE 1 TABLET BY MOUTH ONCE DAILY 90 tablet 1  . Multiple Vitamins-Minerals (MULTIVITAMIN PO) Take 30 mLs by mouth.    . pantoprazole (PROTONIX) 40 MG tablet Take 1 tablet (40 mg total) by mouth 2 (two) times daily. 60 tablet 11  . promethazine (PHENERGAN) 25 MG tablet TAKE 1 TABLET BY MOUTH EVERY 8 HOURS AS NEEDED FOR NAUSEA AND VOMITING 30 tablet 1  . valACYclovir (VALTREX) 500 MG tablet TAKE 1 TABLET BY MOUTH ONCE DAILY 90 tablet 3   No current facility-administered medications for this visit.      ALLERGIES: Estradiol  Family History  Problem Relation Age of Onset  . Diabetes Father   . Diabetes Sister   . Hypertension Sister   . Hypertension Mother     Social History   Social History  . Marital status: Married    Spouse name: N/A  . Number of children: N/A  . Years of education: N/A   Occupational History  . Not on file.   Social History Main Topics  . Smoking status: Never Smoker  . Smokeless tobacco: Never Used  . Alcohol use No  . Drug use: No  . Sexual activity: Yes    Birth control/ protection: Surgical   Other Topics Concern  . Not on file   Social History Narrative  . No narrative on file    Review of Systems  Constitutional: Negative.   HENT: Negative.   Eyes: Negative.   Respiratory: Positive for shortness of breath.   Cardiovascular: Negative.   Gastrointestinal: Negative.   Genitourinary:       Yellow vaginal discharge with odor Night urination   Musculoskeletal: Negative.   Skin:       Hair loss  Neurological: Negative.   Endo/Heme/Allergies: Negative.    Psychiatric/Behavioral: Negative.     PHYSICAL EXAMINATION:    BP 100/70 (BP Location: Right Arm, Patient Position: Sitting, Cuff Size: Normal)   Pulse 88   Resp 16   Wt 203 lb (92.1 kg)   BMI 32.27 kg/m     General appearance: alert, cooperative and appears stated age  Pelvic: External genitalia:  no lesions              Urethra:  normal appearing urethra  with no masses, tenderness or lesions              Bartholins and Skenes: normal                 Vagina: atrophic vagina with mild erythema and some increase in watery, white vaginal discharge               Cervix: absent Chaperone was present for exam.  Wet prep: no clue, no trich, +++ wbc KOH: no  yeast PH: 4.5   ASSESSMENT Vaginal d/c, possible DIV ERT, needs refill, thinks she is not absorbing the estrogen tablet, had prior reaction to the vivelle dot    PLAN Wet prep probe, if negative will treat for DIV Discussed the option of retrying a different patch or the estrogen gel, she wants to try a different patch first    An After Visit Summary was printed and given to the patient.  15 minutes face to face time of which over 50% was spent in counseling.   CC: Dr Sabra Heck

## 2016-03-12 NOTE — Progress Notes (Signed)
Subjective:    Patient ID: Victoria [name], female    DOB: [birth year], 56 y.o.   MRN: [id number]  HPI   56 year old Female for health maintenance exam and evaluation of medical issues. Longstanding history of constipation. Takes MiraLAX and has bowel movement about every 3 days. Been having some issues with epigastric pain waking her at night for a few minutes. Doesn't get up and take Protonix. It goes away on its own. She's concerned about this. Also has noticed some shortness of breath when she speaking with patient's. Has seen Dr. Golden Hurter in the past and will be referred back to cardiology for evaluation.  She sees Dr. Dwyane Dee for reactive hypoglycemia.This started after gastric bypass surgery. She had Roux-en-Y gastric bypass surgery by Dr. Hassell Done in May 2016. She weighed 275 pounds December 2015. By January 2017 she weighed 200 pounds.  History of B-12 deficiency gives herself monthly B-12 injections. Needs vitamin D supplementation.  Remote history of hidradenitis supprativa.  She had a thyroidectomy in 1980 and is on thyroid replacement therapy. Cholecystectomy 1987. History of adenomatous colon polyp. Had colonoscopy in 2009. C-section for twins in 66. Bilateral tubal ligation in the 1980s. Total abdominal hysterectomy 2004. Had left hip replacement December 2010. Right hip replacement March 2014. Endoscopy for epigastric pain 2012 by Dr. Olevia Perches. History of H. pylori infection 2001. History of C. difficile infection treated by Dr. Olevia Perches in 2012. Upper endoscopy in 2012 showed chronic duodenitis treated with PPI. History of GE reflux.  History bacterial vaginosis  Social history: She is divorced but remarried. Husband has back issues. Does not smoke or consume alcohol. 2 adult when daughters. 4 grandchildren. Does not have much time to exercise. Works several jobs. 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  Family history: Mother living with history of  diabetes and hypertension. Father living with history of diabetes. 2 brothers and 1 sister. Sister has hypothyroidism.  She saw Dr. Sabra Heck for GYN exam and evaluation of hormonal therapy. Says estrogen patches break her out so she will need to call that office back for further advice.  She has a history of hypothyroidism    Review of Systems chronic musculoskeletal pain, occasional headaches and history of GE reflux     Objective:   Physical Exam  Constitutional: She is oriented to person, place, and time. She appears well-developed and well-nourished. No distress.  HENT:  Head: Normocephalic and atraumatic.  Right Ear: External ear normal.  Left Ear: External ear normal.  Mouth/Throat: Oropharynx is clear and moist. No oropharyngeal exudate.  Eyes: Right eye exhibits no discharge. Left eye exhibits no discharge.  Neck: Neck supple. No JVD present. No thyromegaly present.  Cardiovascular: Normal rate, normal heart sounds and intact distal pulses.   No murmur heard. Pulmonary/Chest: Effort normal and breath sounds normal. No respiratory distress. She has no wheezes. She has no rales.  Breasts normal female  Abdominal: Bowel sounds are normal. She exhibits no distension and no mass. There is no tenderness. There is no rebound and no guarding.  Genitourinary:  Genitourinary Comments: Pelvic exam deferred to GYN  Musculoskeletal: She exhibits no edema.  Lymphadenopathy:    She has no cervical adenopathy.  Neurological: She is alert and oriented to person, place, and time. She has normal reflexes. No cranial nerve deficit. Coordination normal.  Skin: Skin is warm and dry. No rash noted. She is not diaphoretic.  Psychiatric: She has a normal mood  and affect. Her behavior is normal. Judgment and thought content normal.  Vitals reviewed.         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 Dwyane Dee has to watch blood sugars carefully  History of thyroidectomy-now hypothyroid  B-12 deficiency  History of vitamin D deficiency  Abdominal pain-etiology unclear-wants to see gastroenterologist. She will call in find someone she is comfortable with. She has a remote history of C. difficile infection. No diarrhea. History of chronic duodenitis with PPI with history of GE reflux. History of H. pylori infection 2001. H. pylori test repeated today.  Chronic musculoskeletal pain  Estrogen replacement-to check with GYN regarding treatment  ? Hematuria noted on urine dipstick-urine culture done and shows no growth  Dyspnea-to see cardiologist for evaluation  Low white blood cell count 2300. Needs to be followed up in 4 weeks.  Plan: Return in 6 months for follow-up on hypothyroidism. Continue monthly B-12 injections at home.

## 2016-03-12 NOTE — Progress Notes (Signed)
Patient ID: Victoria [name], female   DOB: 02/09/1961, 56 y.o.   MRN: [id number]            Chief complaint: Low blood sugar episodes  History of Present Illness:  Background history:  Around May 2017 the patient started having symptoms of episodic sweating and shakiness along with the sensation of nearly passing out. She initially went to her gynecologist thinking it was hot flashes. Subsequently patient had more significant symptoms   She tends to have low blood sugars within 1-2 hours of her meals but particularly after breakfast and lunch and usually not after supper  Has not had any overnight low blood sugars except once after midnight Fasting blood sugars: Usually low normal, recent range 66-88 Postprandial readings prior to her first visit: 46-77 after breakfast and 34-97 after lunch from her Accu-Chek monitor download  RECENT history:  Because of her frequent symptoms and almost daily hypoglycemia she was started on Precose 25 mg before breakfast and lunch and verapamil ER 180 mg at dinnertime  With this her symptoms gradually improved She now says that she had cut back on her glucose monitoring previously but is doing more readings now  Accu-Chek Review: She is checking her blood sugars mostly in the mornings, midday and afternoon Fasting readings are ranging from 64-91 Nonfasting readings are mostly below 80 with several low readings at noon or between 1:30 PM and 3:30 PM Lowest reading was 30 5 in the afternoon and she has more frequent low sugars documented in the afternoons  She however says that she usually not symptomatic when she has low blood sugar readings on her monitor She will drink a protein shake if she has a low sugar  Previously treating low sugars with eating a peppermint candy or having grapes or glucose tablets She has had relief of symptoms fairly quickly  Her breakfast is usually [place] yogurt, protein bar or protein shake At lunch may sometimes have  a sandwich but otherwise will have a protein shake She said that she is not able to eat on time because of her work schedule She is trying to take her Precose consistently with her morning and noontime meals  Her weight appears to be increasing  Weight history: Prior to her gastric bypass surgery her weight was 291  Wt Readings from Last 3 Encounters:  03/12/16 205 lb (93 kg)  03/12/16 202 lb (91.6 kg)  12/13/15 199 lb (90.3 kg)      Past Medical History:  Diagnosis Date  . C. difficile diarrhea 10/2010   Infection occurred after father had infection  . Complication of anesthesia    HAD DIFFICULTY SWOLLING SOLID FOOD AFTER INTUBATION FOR PAST SURG  . Degenerative joint disease of low back 09/02/15    Dr. Maureen Ralphs  . GERD (gastroesophageal reflux disease)   . Headache    HX MIGRAINES  . Hemorrhoids   . Hiatal hernia   . History of transfusion   . HSV-2 infection   . Hx of adenomatous colonic polyps 10/17/07  . Hydradenitis   . Hypothyroidism   . Obesity   . Osteoarthritis    left hip  . Pernicious anemia   . PONV (postoperative nausea and vomiting)   . Varicose veins   . Vitamin D deficiency     Past Surgical History:  Procedure Laterality Date  . ABDOMINAL HYSTERECTOMY  2004   ovaries remain  . BREATH TEK H PYLORI N/A 05/03/2014   Procedure: BREATH TEK H PYLORI;  Surgeon: Kaylyn Lim, MD;  Location: [facility];  Service: General;  Laterality: N/A;  . [place]  . CHOLECYSTECTOMY  1987  . GASTRIC ROUX-EN-Y N/A 07/06/2014   Procedure: LAPAROSCOPIC ROUX-EN-Y GASTRIC BYPASS, ENTEROLYSIS OF ADHESIONS WITH UPPER ENDOSCOPY;  Surgeon: Johnathan Hausen, MD;  Location: [facility];  Service: General;  Laterality: N/A;  . HAMMER TOE SURGERY    . HIATAL HERNIA REPAIR N/A 07/06/2014   Procedure: LAPAROSCOPIC REPAIR OF HIATAL HERNIA;  Surgeon: Johnathan Hausen, MD;  Location: [facility];  Service: General;  Laterality: N/A;  . HIP ARTHROPLASTY  01/31/09   left  . hsv 2    .  JOINT REPLACEMENT  01/2009   l hip (BILATERAL TOTAL HIPS)  . REFRACTIVE SURGERY    . THYROIDECTOMY  1980  . TOTAL HIP ARTHROPLASTY Right 05/21/2012   Procedure: TOTAL HIP ARTHROPLASTY;  Surgeon: Gearlean Alf, MD;  Location: [facility];  Service: Orthopedics;  Laterality: Right;    Family History  Problem Relation Age of Onset  . Diabetes Father   . Diabetes Sister   . Hypertension Sister   . Hypertension Mother     Social History:  reports that she has never smoked. She has never used smokeless tobacco. She reports that she does not drink alcohol or use drugs.  Allergies:  Allergies  Allergen Reactions  . Estradiol Rash    Allergies as of 03/12/2016      Reactions   Estradiol Rash      Medication List       Accurate as of 03/12/16  1:51 PM. Always use your most recent med list.          acarbose 25 MG tablet Commonly known as:  PRECOSE TAKE 1 TABLET BY MOUTH AT THE START OF BREAKFAST AND SUPPER   ACCU-CHEK AVIVA PLUS w/Device Kit   ACCU-CHEK SOFTCLIX LANCETS lancets   ALPRAZolam 0.5 MG tablet Commonly known as:  XANAX TAKE 1 TABLET BY MOUTH AT BEDTIME   CALCIUM-VITAMIN D3 PO Take 15 mLs by mouth.   cyanocobalamin 1000 MCG/ML injection Commonly known as:  (VITAMIN B-12) INJECT 1 ML INTRAMUSCULARLY EVERY MONTH   cyclobenzaprine 10 MG tablet Commonly known as:  FLEXERIL TAKE 1 TABLET BY MOUTH AT BEDTIME   glucose blood test strip Commonly known as:  ACCU-CHEK AVIVA PLUS Use for testing glucose up to 8 times a day prn   HYDROcodone-acetaminophen 10-325 MG tablet Commonly known as:  NORCO Take 1 tablet by mouth every 8 (eight) hours as needed.   levothyroxine 100 MCG tablet Commonly known as:  SYNTHROID, LEVOTHROID TAKE 1 TABLET BY MOUTH ONCE DAILY   MULTIVITAMIN PO Take 30 mLs by mouth.   pantoprazole 40 MG tablet Commonly known as:  PROTONIX Take 1 tablet (40 mg total) by mouth 2 (two) times daily.   promethazine 25 MG tablet Commonly known as:   PHENERGAN TAKE 1 TABLET BY MOUTH EVERY 8 HOURS AS NEEDED FOR NAUSEA AND VOMITING   valACYclovir 500 MG tablet Commonly known as:  VALTREX TAKE 1 TABLET BY MOUTH ONCE DAILY   verapamil 180 MG CR tablet Commonly known as:  CALAN-SR TAKE 1 TABLET BY MOUTH ONCE DAILY WITH SUPPER       LABS:  Office Visit on 03/12/2016  Component Date Value Ref Range Status  . Color, UA 03/12/2016 dark yellow   Final  . Clarity, UA 03/12/2016 cloudy   Final  . Glucose, UA 03/12/2016 neg   Final  . Bilirubin, UA  03/12/2016 small   Final  . Ketones, UA 03/12/2016 neg   Final  . Spec Grav, UA 03/12/2016 1.010   Final  . Blood, UA 03/12/2016 moderate   Final  . pH, UA 03/12/2016 7.0   Final  . Protein, UA 03/12/2016 neg   Final  . Urobilinogen, UA 03/12/2016 negative   Final  . Nitrite, UA 03/12/2016 -   Final  . Leukocytes, UA 03/12/2016 Negative  Negative Final       Lab Results  Component Value Date   TSH 2.70 09/09/2015   TSH 2.82 07/05/2015   TSH 11.38 (H) 05/26/2015   FREET4 1.0 09/16/2015   FREET4 0.7 (L) 05/26/2015   FREET4 1.76 08/17/2014     Review of Systems  Endocrine: She has had post ablative hypothyroidism, previously had Graves' disease, followed by PCP Her dose has been stable since it was increased in March No unusual fatigue  Lab Results  Component Value Date   TSH 2.70 09/09/2015   TSH 2.82 07/05/2015   TSH 11.38 (H) 05/26/2015   FREET4 1.0 09/16/2015   FREET4 0.7 (L) 05/26/2015   FREET4 1.76 08/17/2014     PHYSICAL EXAM:  BP 104/66   Pulse 78   Ht 5' 6.5" (1.689 m)   Wt 205 lb (93 kg)   SpO2 98%   BMI 32.59 kg/m      ASSESSMENT:    Post gastrectomy dumping syndrome with reactive hypoglycemia.    She has had Low blood sugars documented on her Accu-Chek meter and free currently does not have any symptoms Previously was having symptoms with low sugars Most of her low sugars are midday or afternoon Currently her diet is fairly low in  carbohydrate usually She has not checked readings after supper and not clear if she is having low readings after supper   Currently complaining of significant constipation with verapamil Not clear why she is gaining weight also    PLAN:   She will need to follow-up with dietitian for meal planning She needs to add fiber to her diet especially at breakfast and lunch to reduce early gastric emptying She will increase her Precose to 50 mg at least at lunch for now and then if tolerated to 50 mg at breakfast also May stop verapamil for now More readings after supper and less fasting  Patient Instructions  Take 2 acarbose at lunch and after 1 week 2 at Bfst also  Add more fiber, add fiber Bar       KUMAR,AJAY 03/12/2016, 1:51 PM

## 2016-03-12 NOTE — Patient Instructions (Addendum)
Appointment with Cardiology regarding shortness of breath. Seek GI referral for epigastric pain. Try Protonix daily for epigastric pain. H pylori breath test pending. Follow-up urine 6 months. Follow-up with Dr. Dwyane Dee today. Repeat white blood cell count in 4 weeks due to low white blood cell count.

## 2016-03-12 NOTE — Patient Instructions (Signed)
Menopause and Hormone Replacement Therapy Introduction WHAT IS HORMONE REPLACEMENT THERAPY? Hormone replacement therapy (HRT) is the use of artificial (synthetic) hormones to replace hormones that your body stops producing during menopause. Menopause is the normal time of life when menstrual periods stop completely and the ovaries stop producing the female hormones estrogen and progesterone. This lack of hormones can affect your health and cause undesirable symptoms. HRT can relieve some of those symptoms. WHAT ARE MY OPTIONS FOR HRT? HRT may consist of the synthetic hormones estrogen and progestin, or it may consist of only estrogen (estrogen-only therapy). You and your health care provider will decide which form of HRT is best for you. If you choose to be on HRT and you have a uterus, estrogen and progestin are usually prescribed. Estrogen-only therapy is used for women who do not have a uterus. Possible options for taking HRT include:  Pills.  Patches.  Gels.  Sprays.  Vaginal cream.  Vaginal rings.  Vaginal inserts. The amount of hormone(s) that you take and how long you take the hormone(s) varies depending on your individual health. It is important to:  Begin HRT with the lowest possible dosage.  Stop HRT as soon as your health care provider tells you to stop.  Work with your health care provider so that you feel informed and comfortable with your decisions. WHAT ARE THE BENEFITS OF HRT? HRT can reduce the frequency and severity of menopausal symptoms. Benefits of HRT vary depending on the menopausal symptoms that you have, the severity of your symptoms, and your overall health. HRT may help to improve the following menopausal symptoms:  Hot flashes and night sweats. These are sudden feelings of heat that spread over the face and body. The skin may turn red, like a blush. Night sweats are hot flashes that happen while you are sleeping or trying to sleep.  Bone loss  (osteoporosis). The body loses calcium more quickly after menopause, causing the bones to become weaker. This can increase the risk for bone breaks (fractures).  Vaginal dryness. The lining of the vagina can become thin and dry, which can cause pain during sexual intercourse or cause infection, burning, or itching.  Urinary tract infections.  Urinary incontinence. This is a decreased ability to control when you urinate.  Irritability.  Short-term memory problems. WHAT ARE THE RISKS OF HRT? Risks of HRT vary depending on your individual health and medical history. Risks of HRT also depend on whether you receive both estrogen and progestin or you receive estrogen only.HRT may increase the risk of:  Spotting. This is when a small amount of bloodleaks from the vagina unexpectedly.  Endometrial cancer. This cancer is in the lining of the uterus (endometrium).  Breast cancer.  Increased density of breast tissue. This can make it harder to find breast cancer on a breast X-ray (mammogram).  Stroke.  Heart attack.  Blood clots.  Gallbladder disease. Risks of HRT can increase if you have any of the following conditions:  Endometrial cancer.  Liver disease.  Heart disease.  Breast cancer.  History of blood clots.  History of stroke. HOW SHOULD I CARE FOR MYSELF WHILE I AM ON HRT?  Take over-the-counter and prescription medicines only as told by your health care provider.  Get mammograms, pelvic exams, and medical checkups as often as told by your health care provider.  Have Pap tests done as often as told by your health care provider. A Pap test is sometimes called a Pap smear. It is a  screening test that is used to check for signs of cancer of the cervix and vagina. A Pap test can also identify the presence of infection or precancerous changes. Pap tests may be done:  Every 3 years, starting at age 66.  Every 5 years, starting after age 4, in combination with testing for  human papillomavirus (HPV).  More often or less often depending on other medical conditions you have, your age, and other risk factors.  It is your responsibility to get your Pap test results. Ask your health care provider or the department performing the test when your results will be ready.  Keep all follow-up visits as told by your health care provider. This is important. WHEN SHOULD I SEEK MEDICAL CARE? Talk with your health care provider if:  You have any of these:  Pain or swelling in your legs.  Shortness of breath.  Chest pain.  Lumps or changes in your breasts or armpits.  Slurred speech.  Pain, burning, or bleeding when you urine.  You develop any of these:  Unusual vaginal bleeding.  Dizziness or headaches.  Weakness or numbness in any part of your arms or legs.  Pain in your abdomen. This information is not intended to replace advice given to you by your health care provider. Make sure you discuss any questions you have with your health care provider. Document Released: 11/11/2002 Document Revised: 07/21/2015 Document Reviewed: 08/16/2014  2017 Elsevier

## 2016-03-12 NOTE — Patient Instructions (Addendum)
Take 2 acarbose at lunch and after 1 week 2 at Bfst also  Add more fiber, add fiber Bar

## 2016-03-13 LAB — URINE CULTURE: Organism ID, Bacteria: NO GROWTH

## 2016-03-13 LAB — WET PREP BY MOLECULAR PROBE
Candida species: NEGATIVE
Gardnerella vaginalis: NEGATIVE
Trichomonas vaginosis: NEGATIVE

## 2016-03-13 LAB — VITAMIN D 25 HYDROXY (VIT D DEFICIENCY, FRACTURES): Vit D, 25-Hydroxy: 34 ng/mL (ref 30–100)

## 2016-03-13 LAB — H. PYLORI BREATH TEST: H. pylori Breath Test: NOT DETECTED

## 2016-03-13 LAB — TSH: TSH: 2.58 mIU/L

## 2016-03-16 ENCOUNTER — Telehealth: Payer: Self-pay

## 2016-03-16 MED ORDER — HYDROCORTISONE ACETATE 25 MG RE SUPP: RECTAL | 0 refills | Status: DC

## 2016-03-16 MED FILL — ANUSOL-HC 25 MG SUPPOSITORY: 25 | 28 days supply | Qty: 28 | Fill #0

## 2016-03-16 NOTE — Telephone Encounter (Signed)
Spoke with patient. Advised patient of message as seen below from [place]. Patient verbalizes understanding. Rx for Hydrocortisone Suppositories 25 mg place 1/2 a suppository intravaginally BID x 4 weeks #28 0RF sent to pharmacy on file. Patient verbalizes understanding. Patient is unable to schedule follow up at this time due to not having work schedule. Will contact the office Monday to schedule.  Cc: Dr.Miller  Routing to provider for final review. Patient agreeable to disposition. Will close encounter.

## 2016-03-16 NOTE — Telephone Encounter (Signed)
-----   Message from Salvadore Dom, MD sent at 03/13/2016  4:20 PM EST ----- Please inform the patient that her vaginitis panel was negative. 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 Please call in hydrocortisone suppositories, 25 mg, 1/2 a suppository intravaginally BID x 4 weeks. Then she needs a f/u appointment (with Dr Sabra Heck)

## 2016-03-19 ENCOUNTER — Ambulatory Visit: Payer: 59 | Admitting: Dietician

## 2016-03-28 ENCOUNTER — Encounter: Payer: Self-pay | Admitting: Internal Medicine

## 2016-03-30 ENCOUNTER — Encounter: Payer: Self-pay | Admitting: Podiatry

## 2016-03-30 ENCOUNTER — Ambulatory Visit (INDEPENDENT_AMBULATORY_CARE_PROVIDER_SITE_OTHER): Payer: 59 | Admitting: Podiatry

## 2016-03-30 ENCOUNTER — Ambulatory Visit (INDEPENDENT_AMBULATORY_CARE_PROVIDER_SITE_OTHER): Payer: 59

## 2016-03-30 DIAGNOSIS — M2041 Other hammer toe(s) (acquired), right foot: Secondary | ICD-10-CM

## 2016-03-30 DIAGNOSIS — M722 Plantar fascial fibromatosis: Secondary | ICD-10-CM

## 2016-03-30 DIAGNOSIS — L84 Corns and callosities: Secondary | ICD-10-CM

## 2016-03-30 MED ORDER — TRIAMCINOLONE ACETONIDE 10 MG/ML IJ SUSP
10.0000 mg | Freq: Once | INTRAMUSCULAR | Status: AC
  Administered 2016-03-30: 10 mg

## 2016-03-31 NOTE — Progress Notes (Signed)
Subjective:     Patient ID: Victoria [name], female   DOB: Apr 06, 1960, 56 y.o.   MRN: [id number]  HPI patient presents with chronic pain in the heel region bilateral with inflammation fluid buildup and is noted to have a severe keratotic lesion digit 5 right that's very painful when pressed and makes shoe gear difficult. She's tried wider shoes and other options and no she needs to get it fixed   Review of Systems     Objective:   Physical Exam Neurovascular status intact negative Homans sign was noted with severe discomfort plantar heel region bilateral with depression of the arch and rotated fifth digit right with keratotic lesion formation that's very painful when pressed    Assessment:     Chronic plantar fasciitis bilateral with rotated fifth digit and keratotic lesion fifth digit right foot    Plan:     H&P all conditions reviewed and discussed. Today for the heels I did inject 3 mg Kenalog 5 mill grams Xylocaine and I debrided lesion fifth right and discussed surgical correction of toe that she's can have done at her convenience  X-ray report indicates that there is spur formation rotated fifth toe with regrowth of bone had a proximal phalanx

## 2016-04-02 ENCOUNTER — Telehealth: Payer: Self-pay | Admitting: *Deleted

## 2016-04-02 ENCOUNTER — Other Ambulatory Visit: Payer: Self-pay | Admitting: Internal Medicine

## 2016-04-02 DIAGNOSIS — Z1231 Encounter for screening mammogram for malignant neoplasm of breast: Secondary | ICD-10-CM

## 2016-04-02 NOTE — Telephone Encounter (Signed)
I attempted to return patient's call.  I left her a message to call me back. 

## 2016-04-02 NOTE — Telephone Encounter (Signed)
"  Please give me a call, thank you."

## 2016-04-03 ENCOUNTER — Encounter: Payer: 59 | Attending: Surgery | Admitting: Dietician

## 2016-04-03 DIAGNOSIS — E669 Obesity, unspecified: Secondary | ICD-10-CM

## 2016-04-03 DIAGNOSIS — Z713 Dietary counseling and surveillance: Secondary | ICD-10-CM | POA: Diagnosis not present

## 2016-04-03 NOTE — Telephone Encounter (Signed)
"  I'm returning your call."   I was returning your call.  How can I help you?  "I want to know how long I would need to be out of work once I have my surgery.  Dr. Paulla Dolly said I would be out about a week."  Do you sit on your job?  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  "Okay, that's what I thought.  I need to check with my husband and my supervisor before I schedule it.  I'll give you a call back."

## 2016-04-03 NOTE — Patient Instructions (Addendum)
Goals:  -Continue to follow up with Dr. Dwyane Dee and Dr. Hassell Done -Work on having lean meat, vegetables, and small amount of starch for dinner -Continue exercise routine   TANITA  BODY COMP RESULTS  06/21/14 07/20/14 08/26/14 09/27/14 11/10/14 12/21/14 03/09/15 06/24/15 09/09/15 12/05/15 04/03/16   BMI (kg/m^2) 45.9 42.4 39.8 38.8 35.1 34.1 31.9 31.2 31.2 32.1 33.2   Fat Mass (lbs) 154.5 143.5 126 110 98 92.5 81 81.5 86.4 81.8 88.8   Fat Free Mass (lbs) 134 123.0 124.5 134 122.5 122 119.5 115 110 120.4 120.2   Total Body Water (lbs) 98 90.0 91 98 89.5 89.5 87.5 84 78.6 86 86.2

## 2016-04-03 NOTE — Progress Notes (Signed)
  Follow-up visit: 1.75 years Post-Operative RYGB Surgery  Medical Nutrition Therapy:  Appt start time:515 End time: 12  Primary concerns today: Post-operative Bariatric Surgery Nutrition Management.  Victoria [name] returns today for a nutrition follow up. Still struggling with hypoglycemia, especially mid-morning. Working with Dr. Dwyane Dee (endocrinologist) and taking Acarbose before breakfast and lunch. Has not seen Dr. Hassell Done (bariatric surgeon) recently and plans to see him in May 2018 if not before.   Blood glucose was 93 mg/dL this morning fasting and 74 mg/dL at 9am after eating sausage, egg, and cheese McMuffin before 6am. Feels like this breakfast is "holding her" better than yogurt and protein bar. Working out 3-4x a week for about an hour (3 miles on the treadmill and lifting weights). Frustrated that she is gaining weight and would like to meet her goal of 200 lbs or less.   Encouraged the patient to continue exercise routine and work on having lean protein and vegetables for evening meal.    Surgery date: 07/06/2014 Surgery type: RYGB [facility] weight at [facility]: 286 lbs on 05/01/14 (highest weight 291 per patient) Weight today: 209 lbs Weight change: 7 lbs gain  Weight loss goal: under 200 lbs (ultimately 180 lbs)   TANITA  BODY COMP RESULTS  06/21/14 07/20/14 08/26/14 09/27/14 11/10/14 12/21/14 03/09/15 06/24/15 09/09/15 09/22/15 12/05/15 04/03/16   BMI (kg/m^2) 45.9 42.4 39.8 38.8 35.1 34.1 31.9 31.2 31.2 NA 32.1 33.2   Fat Mass (lbs) 154.5 143.5 126 110 98 92.5 81 81.5 86.4  81.8 88.8   Fat Free Mass (lbs) 134 123.0 124.5 134 122.5 122 119.5 115 110  120.4 120.2   Total Body Water (lbs) 98 90.0 91 98 89.5 89.5 87.5 84 78.6  86 86.2    Preferred Learning Style:   No preference indicated   Learning Readiness:   Ready  Breakfast (before 6am): sausage, egg, and cheese McMuffin (30g carbs, 21g protein) Snack (10:30 am): yogurt or protein shake Lunch (1:30-2pm): protein bar and Greek yogurt Snack  (3:30 pm): Protein shake  Dinner (6 pm): BBQ ribs and turnip greens and cornbread OR baked chicken breast  Medications: see list Supplementation: taking liquid form, sometimes forgets Calcium  Using straws: yes, doesn't feel like like it adds gas Drinking while eating: no Hair loss: some Carbonated beverages: no N/V/D/C: some nausea Dumping syndrome: none  Recent physical activity:  None (back injury)  Progress Towards Goal(s):  In progress.    Nutritional Diagnosis:  Victoria-3.3 Overweight/obesity related to past poor dietary habits and physical inactivity as evidenced by patient w/ recent RYGB surgery following dietary guidelines for continued weight loss.     Intervention:  Nutrition counseling provided. Goals: -Continue to follow up with Dr. Dwyane Dee and Dr. Hassell Done -Work on having lean meat, vegetables, and small amount of starch for dinner -Continue exercise routine   Teaching Method Utilized:  Visual Auditory Hands on  Barriers to learning/adherence to lifestyle change: taste changes  Demonstrated degree of understanding via:  Teach Back   Monitoring/Evaluation:  Dietary intake, exercise, and body weight. Follow up in 3 months.

## 2016-04-04 ENCOUNTER — Telehealth: Payer: Self-pay | Admitting: Obstetrics and Gynecology

## 2016-04-04 ENCOUNTER — Encounter: Payer: Self-pay | Admitting: Dietician

## 2016-04-04 NOTE — Telephone Encounter (Signed)
Spoke with patient. Patient states she started a new estradiol patch 03/12/16. Patient describes rash as fine, red, itchy rash with broken skin. Patient states rash is same size as patch. Patient states she had same reaction to vivelle dot, but was worse with vivelle. Patient states she discussed with Dr. Talbert Nan possibly using a gel form if this patch did not work. Patient states the gel may be costly. Patient states she is due for refill and would like to try something else. Patient states she is currently using a hydrocortisone suppository for 1 month and would like to make sure nothing contraindicates with that. Advised patient would review with Dr. Talbert Nan and return call with recommendations, patient is agreeable.  Dr. Talbert Nan, please advise?  Cc: Dr. Sabra Heck

## 2016-04-04 NOTE — Telephone Encounter (Signed)
Patient states she started new estradiol patch in January due to rash caused by previous patch.  Patient states she is now having a rash under this patch as well. Patient would like to speak with a nurse about any other options.

## 2016-04-04 NOTE — Telephone Encounter (Signed)
She can try Divigel 1mg /gram, apply topically qd, # 1 month supply with refills until she is due for her annual exam (she may want to check on the cost). The gel is supposed to be spread over either upper thigh. Given her prior bariatric surgery she should do better with a topical than oral estradiol.

## 2016-04-04 NOTE — Telephone Encounter (Signed)
Left message to call Jill at [phone].  

## 2016-04-05 ENCOUNTER — Other Ambulatory Visit: Payer: Self-pay | Admitting: Internal Medicine

## 2016-04-05 MED ORDER — ESTRADIOL 1 MG/GM TD GEL
1.0000 mg | Freq: Every day | TRANSDERMAL | 2 refills | Status: DC
Start: 2016-04-05 — End: 2016-09-19

## 2016-04-05 MED FILL — ACARBOSE 25 MG TABLET: 25 | 30 days supply | Qty: 60 | Fill #2

## 2016-04-05 MED FILL — LEVOTHYROXINE 100 MCG TAB: 100 | 90 days supply | Qty: 90 | Fill #1

## 2016-04-05 MED FILL — PANTOPRAZOLE SOD DR 40 MG T: 40 | 30 days supply | Qty: 60 | Fill #0

## 2016-04-05 MED FILL — DIVIGEL 1 MG GEL PACKET: 1 | 30 days supply | Qty: 30 | Fill #0

## 2016-04-05 NOTE — Telephone Encounter (Signed)
Dr. Talbert Nan, can you clarify how much Divigel you want patient to use daily?

## 2016-04-05 NOTE — Telephone Encounter (Signed)
Spoke with patient, advised as seen below per Dr. Talbert Nan. Advised patient new order placed at [facility], follow-up with pharmacy for filling. Advised patient to return call to office for any additional questions/concerns. Patient verbalizes understanding and is agreeable.  Routing to provider for final review. Patient is agreeable to disposition. Will close encounter.

## 2016-04-12 ENCOUNTER — Other Ambulatory Visit: Payer: 59 | Admitting: Internal Medicine

## 2016-04-12 ENCOUNTER — Ambulatory Visit (INDEPENDENT_AMBULATORY_CARE_PROVIDER_SITE_OTHER): Payer: 59 | Admitting: Cardiology

## 2016-04-12 VITALS — BP 104/78 | HR 57 | Ht 66.5 in | Wt 205.8 lb

## 2016-04-12 DIAGNOSIS — K219 Gastro-esophageal reflux disease without esophagitis: Secondary | ICD-10-CM | POA: Diagnosis not present

## 2016-04-12 DIAGNOSIS — R0602 Shortness of breath: Secondary | ICD-10-CM | POA: Diagnosis not present

## 2016-04-12 DIAGNOSIS — D72819 Decreased white blood cell count, unspecified: Secondary | ICD-10-CM | POA: Diagnosis not present

## 2016-04-12 DIAGNOSIS — R002 Palpitations: Secondary | ICD-10-CM | POA: Diagnosis not present

## 2016-04-12 LAB — CBC WITH DIFFERENTIAL/PLATELET
Basophils Absolute: 35 cells/uL (ref 0–200)
Basophils Relative: 1 %
Eosinophils Absolute: 70 cells/uL (ref 15–500)
Eosinophils Relative: 2 %
HCT: 39.4 % (ref 35.0–45.0)
Hemoglobin: 12.6 g/dL (ref 11.7–15.5)
Lymphocytes Relative: 39 %
Lymphs Abs: 1365 cells/uL (ref 850–3900)
MCH: 31.7 pg (ref 27.0–33.0)
MCHC: 32 g/dL (ref 32.0–36.0)
MCV: 99.2 fL (ref 80.0–100.0)
MPV: 11.2 fL (ref 7.5–12.5)
Monocytes Absolute: 315 cells/uL (ref 200–950)
Monocytes Relative: 9 %
Neutro Abs: 1715 cells/uL (ref 1500–7800)
Neutrophils Relative %: 49 %
Platelets: 233 10*3/uL (ref 140–400)
RBC: 3.97 MIL/uL (ref 3.80–5.10)
RDW: 13.8 % (ref 11.0–15.0)
WBC: 3.5 10*3/uL — ABNORMAL LOW (ref 3.8–10.8)

## 2016-04-12 NOTE — Progress Notes (Signed)
Cardiology Office Note    Date:  04/12/2016   ID:  Victoria [name], DOB Mar 09, 1960, MRN [id number]  PCP:  Elby Showers, MD  Cardiologist:  Fransico Him, MD   Chief Complaint  Patient presents with  . Shortness of Breath  . Hypertension    History of Present Illness:  Victoria [name] is a 56 y.o. female with a history of GERD and hiatal hernia presents today for followup of chest pain. She recently was me for complaints of chest pain in the past and it was thought to be GERD and had been placed on Protonix but would still get the CP every few months. She underwent nuclear stress test which showed low risk study with a very small anterior perfusion defect that most likely represents breast attenuation artifact. Less likely this could represent a true perfusion abnormality in the distribution of a diagonal artery branch of the LAD.  Her EF was normal.  She was started on Protonix for possible GERD symptoms and CP resolved.  I have not seen her for 3 years and she is now here for SOB.  She had a gastric bypass in 2016 and says that she has not felt well since then.  She has noticed recently that she has been SOB for 2-3 months.  This occurs mainly standing talking to patients and putting information in during the admission process.  She has been exercising since January walking 3 miles and gets winded but nothing severe.  She says that she feels more short winded standing talking to patients than walking.  She has been waking up with some epigastric pain but that has resolved recently.  She has had some problems with lightheadedness but no syncope.  She denies any PND or orthopnea.  She has no LE edema. She also has been having problems with fluttering in her chest that lasts a few minutes a few times a week.  She notices it more at night.  Past Medical History:  Diagnosis Date  . C. difficile diarrhea 10/2010   Infection occurred after father had infection  . Complication of anesthesia    HAD  DIFFICULTY SWOLLING SOLID FOOD AFTER INTUBATION FOR PAST SURG  . Degenerative joint disease of low back 09/02/15    Dr. Maureen Ralphs  . GERD (gastroesophageal reflux disease)   . Headache    HX MIGRAINES  . Hemorrhoids   . Hiatal hernia   . History of transfusion   . HSV-2 infection   . Hx of adenomatous colonic polyps 10/17/07  . Hydradenitis   . Hypothyroidism   . Obesity   . Osteoarthritis    left hip  . Pernicious anemia   . PONV (postoperative nausea and vomiting)   . Varicose veins   . Vitamin D deficiency     Past Surgical History:  Procedure Laterality Date  . ABDOMINAL HYSTERECTOMY  2004   ovaries remain  . BREATH TEK H PYLORI N/A 05/03/2014   Procedure: BREATH TEK H PYLORI;  Surgeon: Kaylyn Lim, MD;  Location: [facility];  Service: General;  Laterality: N/A;  . [place]  . CHOLECYSTECTOMY  1987  . GASTRIC ROUX-EN-Y N/A 07/06/2014   Procedure: LAPAROSCOPIC ROUX-EN-Y GASTRIC BYPASS, ENTEROLYSIS OF ADHESIONS WITH UPPER ENDOSCOPY;  Surgeon: Johnathan Hausen, MD;  Location: [facility];  Service: General;  Laterality: N/A;  . HAMMER TOE SURGERY    . HIATAL HERNIA REPAIR N/A 07/06/2014   Procedure: LAPAROSCOPIC REPAIR OF HIATAL HERNIA;  Surgeon: Johnathan Hausen,  MD;  Location: [facility];  Service: General;  Laterality: N/A;  . HIP ARTHROPLASTY  01/31/09   left  . hsv 2    . JOINT REPLACEMENT  01/2009   l hip (BILATERAL TOTAL HIPS)  . REFRACTIVE SURGERY    . THYROIDECTOMY  1980  . TOTAL HIP ARTHROPLASTY Right 05/21/2012   Procedure: TOTAL HIP ARTHROPLASTY;  Surgeon: Gearlean Alf, MD;  Location: [facility];  Service: Orthopedics;  Laterality: Right;    Current Medications: Current Meds  Medication Sig  . acarbose (PRECOSE) 25 MG tablet TAKE 1 TABLET BY MOUTH AT THE START OF BREAKFAST AND SUPPER  . ACCU-CHEK SOFTCLIX LANCETS lancets   . ALPRAZolam (XANAX) 0.5 MG tablet TAKE 1 TABLET BY MOUTH AT BEDTIME  . Blood Glucose Monitoring Suppl (ACCU-CHEK AVIVA PLUS) w/Device KIT    . Calcium Carbonate-Vitamin D (CALCIUM-VITAMIN D3 PO) Take 15 mLs by mouth.  . cyanocobalamin (,VITAMIN B-12,) 1000 MCG/ML injection INJECT 1 ML INTRAMUSCULARLY EVERY MONTH  . cyclobenzaprine (FLEXERIL) 10 MG tablet TAKE 1 TABLET BY MOUTH AT BEDTIME  . Estradiol 1 MG/GM GEL Apply 1 mg topically daily.  Marland Kitchen glucose blood (ACCU-CHEK AVIVA PLUS) test strip Use for testing glucose up to 8 times a day prn  . HYDROcodone-acetaminophen (NORCO) 10-325 MG tablet Take 1 tablet by mouth every 8 (eight) hours as needed (pain).  . hydrocortisone (ANUSOL-HC) 25 MG suppository Place 1/2 a suppository intravaginally BID x 4 weeks.  Marland Kitchen levothyroxine (SYNTHROID, LEVOTHROID) 100 MCG tablet TAKE 1 TABLET BY MOUTH ONCE DAILY  . Multiple Vitamins-Minerals (MULTIVITAMIN PO) Take 30 mLs by mouth.  . pantoprazole (PROTONIX) 40 MG tablet TAKE 1 TABLET BY MOUTH TWICE DAILY  . promethazine (PHENERGAN) 25 MG tablet TAKE 1 TABLET BY MOUTH EVERY 8 HOURS AS NEEDED FOR NAUSEA AND VOMITING  . valACYclovir (VALTREX) 500 MG tablet TAKE 1 TABLET BY MOUTH ONCE DAILY    Allergies:   Estradiol   Social History   Social History  . Marital status: Married    Spouse name: N/A  . Number of children: N/A  . Years of education: N/A   Social History Main Topics  . Smoking status: Never Smoker  . Smokeless tobacco: Never Used  . Alcohol use No  . Drug use: No  . Sexual activity: Yes    Birth control/ protection: Surgical   Other Topics Concern  . Not on file   Social History Narrative  . No narrative on file     Family History:  The patient's family history includes Diabetes in her father and sister; Hypertension in her mother and sister.   ROS:   Please see the history of present illness.    ROS All other systems reviewed and are negative.  No flowsheet data found.     PHYSICAL EXAM:   VS:  BP 104/78   Pulse (!) 57   Ht 5' 6.5" (1.689 m)   Wt 205 lb 12.8 oz (93.4 kg)   BMI 32.72 kg/m    GEN: Well nourished,  well developed, in no acute distress  HEENT: normal  Neck: no JVD, carotid bruits, or masses Cardiac: RRR; no murmurs, rubs, or gallops,no edema.  Intact distal pulses bilaterally.  Respiratory:  clear to auscultation bilaterally, normal work of breathing GI: soft, nontender, nondistended, + BS MS: no deformity or atrophy  Skin: warm and dry, no rash Neuro:  Alert and Oriented x 3, Strength and sensation are intact Psych: euthymic mood, full affect  Wt Readings from  Last 3 Encounters:  04/12/16 205 lb 12.8 oz (93.4 kg)  04/04/16 209 lb (94.8 kg)  03/12/16 203 lb (92.1 kg)      Studies/Labs Reviewed:   EKG:  EKG is  ordered today.  This showed sinus bradycardia at 57bpm with no ST changes  Recent Labs: 03/12/2016: ALT 32; BUN 14; Creat 0.95; Hemoglobin 13.1; Platelets 230; Potassium 4.6; Sodium 139; TSH 2.58   Lipid Panel    Component Value Date/Time   CHOL 183 03/12/2016 1038   TRIG 47 03/12/2016 1038   HDL 88 03/12/2016 1038   CHOLHDL 2.1 03/12/2016 1038   VLDL 9 03/12/2016 1038   LDLCALC 86 03/12/2016 1038    Additional studies/ records that were reviewed today include:  none    ASSESSMENT:    1. SOB (shortness of breath)   2. Heart palpitations   3. Gastroesophageal reflux disease without esophagitis      PLAN:  In order of problems listed above:  1. SOB that is atypical in that it is not very bad when she exercise but seems to notices it most when she is talking.  EKG is nonischemic.  She has no CP.  I will check a 2D echo to assess LVF and diastolic function and get an ETT. 2. Palpitations - need to rule out afib.  If she is having intermittent arrhythmias this could make her SOB.  I will get an event monitor to assess for arrhythmias. 3. GERD - Continue PPI    Medication Adjustments/Labs and Tests Ordered: Current medicines are reviewed at length with the patient today.  Concerns regarding medicines are outlined above.  Medication changes, Labs and  Tests ordered today are listed in the Patient Instructions below.  There are no Patient Instructions on file for this visit.   Signed, Fransico Him, MD  04/12/2016 8:37 AM    [address], [place], [place]  [address] Phone: [phone]; Fax: [phone]

## 2016-04-12 NOTE — Patient Instructions (Signed)
Medication Instructions:  Your physician recommends that you continue on your current medications as directed. Please refer to the Current Medication list given to you today.   Labwork: None  Testing/Procedures: Your physician has requested that you have an echocardiogram. Echocardiography is a painless test that uses sound waves to create images of your heart. It provides your doctor with information about the size and shape of your heart and how well your heart's chambers and valves are working. This procedure takes approximately one hour. There are no restrictions for this procedure.  Your physician has requested that you have an exercise tolerance test. For further information please visit [email or web address]. Please also follow instruction sheet, as given.  Your physician has recommended that you wear an event monitor. Event monitors are medical devices that record the heart's electrical activity. Doctors most often us these monitors to diagnose arrhythmias. Arrhythmias are problems with the speed or rhythm of the heartbeat. The monitor is a small, portable device. You can wear one while you do your normal daily activities. This is usually used to diagnose what is causing palpitations/syncope (passing out).  Follow-Up: Your physician recommends that you schedule a follow-up appointment AS NEEDED with Dr. Turner pending study results.  Any Other Special Instructions Will Be Listed Below (If Applicable).     If you need a refill on your cardiac medications before your next appointment, please call your pharmacy.   

## 2016-04-12 NOTE — Progress Notes (Signed)
Thank you. She has not felt well since gastric bypass surgery.

## 2016-04-19 ENCOUNTER — Ambulatory Visit (INDEPENDENT_AMBULATORY_CARE_PROVIDER_SITE_OTHER): Payer: 59

## 2016-04-19 ENCOUNTER — Encounter: Payer: Self-pay | Admitting: Obstetrics & Gynecology

## 2016-04-19 ENCOUNTER — Ambulatory Visit (INDEPENDENT_AMBULATORY_CARE_PROVIDER_SITE_OTHER): Payer: 59 | Admitting: Obstetrics & Gynecology

## 2016-04-19 VITALS — BP 106/64 | HR 76 | Resp 16 | Ht 66.5 in | Wt 208.0 lb

## 2016-04-19 DIAGNOSIS — N760 Acute vaginitis: Secondary | ICD-10-CM

## 2016-04-19 DIAGNOSIS — L089 Local infection of the skin and subcutaneous tissue, unspecified: Secondary | ICD-10-CM

## 2016-04-19 DIAGNOSIS — N766 Ulceration of vulva: Secondary | ICD-10-CM

## 2016-04-19 DIAGNOSIS — Z79890 Hormone replacement therapy (postmenopausal): Secondary | ICD-10-CM

## 2016-04-19 DIAGNOSIS — R002 Palpitations: Secondary | ICD-10-CM

## 2016-04-19 NOTE — Progress Notes (Signed)
GYNECOLOGY  VISIT   HPI: 56 y.o. G2P0112 Married [place] American female here for follow up of two issues:  1) vaginal discharge that was felt to be DIV.  Pt was treated with vaginal steroid suppositories.  She is feeling much better.  Denies vaginal bleeding.  2) Menopausal symptoms.  Pt was initially seen last year in referral from Dr. Renold Genta.  She had a reaction to the adhesive in the estradiol patches so was switched to oral but it was felt she was not absorbing this well.  She was changed to topical estradiol gel and is now having a fine erythematous rash on the right thigh (but not the left) where the places the gel.  She does feel "better" with the topical product but is a little anxious about the rash.    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  She's been advised this is quite rare after gastric bypass but does occur.  She has seen endocrinology and is on acarbose.  She's having some issues with co-workers due to times when she's having more severe symptoms.  Has considered doing [employer] papers but her supervisor has been "great".  She also reports a recent issue with an area on her buttocks that won't heal.  Has been treated with antibiotics a couple of times.  Would like me to look at this for her today.  No LMP recorded. Patient has had a hysterectomy.    GYNECOLOGIC HISTORY: No LMP recorded. Patient has had a hysterectomy. Contraception: Hysterectomy Menopausal hormone therapy: estradiol gel  Patient Active Problem List   Diagnosis Date Noted  . Heart palpitations 04/12/2016  . SOB (shortness of breath) 04/12/2016  . 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 Lap gastric bypass May 2016 07/06/2014  . Chest pain 03/11/2013  . History of Clostridium difficile  infection 08/12/2011  . Hypothyroidism, postsurgical 08/26/2010  . GE reflux 08/26/2010  . Vitamin D deficiency 08/26/2010  . Fibrocystic breast disease 08/26/2010  . B12 deficiency 08/26/2010  . OBESITY, UNSPECIFIED 03/20/2010  . BACK PAIN 03/20/2010    Past Medical History:  Diagnosis Date  . C. difficile diarrhea 10/2010   Infection occurred after father had infection  . Complication of anesthesia    HAD DIFFICULTY SWOLLING SOLID FOOD AFTER INTUBATION FOR PAST SURG  . Degenerative joint disease of low back 09/02/15    Dr. Maureen Ralphs  . GERD (gastroesophageal reflux disease)   . Headache    HX MIGRAINES  . Hemorrhoids   . Hiatal hernia   . History of transfusion   . HSV-2 infection   . Hx of adenomatous colonic polyps 10/17/07  . Hydradenitis   . Hypothyroidism   . Obesity   . Osteoarthritis    left hip  . Pernicious anemia   . PONV (postoperative nausea and vomiting)   . Varicose veins   . Vitamin D deficiency     Past Surgical History:  Procedure Laterality Date  . ABDOMINAL HYSTERECTOMY  2004   ovaries remain  . BREATH TEK H PYLORI N/A 05/03/2014   Procedure: BREATH TEK H PYLORI;  Surgeon: Kaylyn Lim, MD;  Location: [facility];  Service: General;  Laterality: N/A;  . [place]  . CHOLECYSTECTOMY  1987  . GASTRIC ROUX-EN-Y N/A 07/06/2014   Procedure: LAPAROSCOPIC ROUX-EN-Y GASTRIC  BYPASS, ENTEROLYSIS OF ADHESIONS WITH UPPER ENDOSCOPY;  Surgeon: Johnathan Hausen, MD;  Location: [facility];  Service: General;  Laterality: N/A;  . HAMMER TOE SURGERY    . HIATAL HERNIA REPAIR N/A 07/06/2014   Procedure: LAPAROSCOPIC REPAIR OF HIATAL HERNIA;  Surgeon: Johnathan Hausen, MD;  Location: [facility];  Service: General;  Laterality: N/A;  . HIP ARTHROPLASTY  01/31/09   left  . hsv 2    . JOINT REPLACEMENT  01/2009   l hip (BILATERAL TOTAL HIPS)  . REFRACTIVE SURGERY    . THYROIDECTOMY  1980  . TOTAL HIP ARTHROPLASTY Right 05/21/2012   Procedure: TOTAL HIP ARTHROPLASTY;   Surgeon: Gearlean Alf, MD;  Location: [facility];  Service: Orthopedics;  Laterality: Right;    MEDS:  Reviewed in EPIC and UTD  ALLERGIES: Estradiol  Family History  Problem Relation Age of Onset  . Diabetes Father   . Diabetes Sister   . Hypertension Sister   . Hypertension Mother    SH:  Married, non smoker  ROS  PHYSICAL EXAMINATION:    BP 106/64 (BP Location: Right Arm, Patient Position: Sitting, Cuff Size: Normal)   Pulse 76   Resp 16   Ht 5' 6.5" (1.689 m)   Wt 208 lb (94.3 kg)   BMI 33.07 kg/m     General appearance: alert, cooperative and appears stated age Abdomen: soft, non-tender; bowel sounds normal; no masses,  no organomegaly  Pelvic: External genitalia:  no lesions, single ulceratio on right buttocks above rectum near gluteal cleft, mildly tender, edged bled easily with touch of q tip,               Urethra:  normal appearing urethra with no masses, tenderness or lesions              Bartholins and Skenes: normal                 Vagina: normal appearing vagina with normal color and discharge, no lesions              Cervix: absent              Bimanual Exam:  Uterus:  uterus absent              Adnexa: no mass, fullness, tenderness              Anus:  normal sphincter tone, no lesions  Chaperone was present for exam.  Assessment: PMP, on HRT with absorption issues with oral estradiol Fine rash that may be due to topical estradiol gel Dumping syndrome with hypoglycemia episodes, on acarbose and followed by endocrinology Single ulceration on buttocks--not consistent with HSV  Plan: D/w pt other options for HRT therapy.  Compounded options vs femring.  This does not appear to be on her formulary.  Will see if there is anyway to do a tier exception.  If this isn't possible, may need to try and use compounded vaginal option.   Skin culture obtained of ulceration.  ~30 minutes spent with patient >50% of time was in face to face discussion of above.

## 2016-04-20 ENCOUNTER — Telehealth: Payer: Self-pay | Admitting: Endocrinology

## 2016-04-20 ENCOUNTER — Other Ambulatory Visit: Payer: Self-pay

## 2016-04-20 MED ORDER — ACARBOSE 50 MG PO TABS: ORAL_TABLET | ORAL | 2 refills | Status: DC

## 2016-04-20 MED ORDER — ACARBOSE 25 MG PO TABS: ORAL_TABLET | ORAL | 2 refills | Status: DC

## 2016-04-20 MED FILL — ACARBOSE 50 MG TAB: 50 | 30 days supply | Qty: 60 | Fill #0

## 2016-04-20 NOTE — Telephone Encounter (Signed)
See message and please advise, Thanks!  

## 2016-04-20 NOTE — Telephone Encounter (Signed)
Ok for 50mg 

## 2016-04-20 NOTE — Telephone Encounter (Signed)
Pt is out °

## 2016-04-20 NOTE — Telephone Encounter (Signed)
acarbos has been increased 2 in am and 2 in pm. 25 mg pills. Can the pt just get 1 50 mg pill in the AM and PM.  Can we please call in this dosing called into [facility] pharmacy

## 2016-04-20 NOTE — Telephone Encounter (Signed)
Refill submitted. 

## 2016-04-21 DIAGNOSIS — R002 Palpitations: Secondary | ICD-10-CM | POA: Diagnosis not present

## 2016-04-21 NOTE — Addendum Note (Signed)
Addended by: Megan Salon on: 04/21/2016 11:11 PM   Modules accepted: Orders

## 2016-04-22 LAB — WOUND CULTURE
Gram Stain: NONE SEEN
Gram Stain: NONE SEEN
Gram Stain: NONE SEEN
Organism ID, Bacteria: NORMAL

## 2016-04-23 ENCOUNTER — Other Ambulatory Visit: Payer: Self-pay | Admitting: Internal Medicine

## 2016-04-23 MED FILL — CYCLOBENZAPRINE 10 MG TAB: 10 | 30 days supply | Qty: 30 | Fill #0

## 2016-04-23 MED FILL — ALPRAZolam 0.5 MG TABS: 0.5 | 30 days supply | Qty: 30 | Fill #0

## 2016-04-23 MED FILL — PROMETHAZINE 25 MG TABLET: 25 | 10 days supply | Qty: 30 | Fill #1

## 2016-04-23 NOTE — Telephone Encounter (Signed)
Refill each x 6 months 

## 2016-04-24 ENCOUNTER — Telehealth: Payer: Self-pay

## 2016-04-24 MED ORDER — AZITHROMYCIN 250 MG PO TABS: ORAL_TABLET | ORAL | 0 refills | Status: DC

## 2016-04-24 MED FILL — AZITHROMYCIN 250 MG TABLET: 250 | 1 days supply | Qty: 4 | Fill #0

## 2016-04-24 NOTE — Telephone Encounter (Signed)
Spoke with patient. Advised of results as seen below from [facility]. Patient verbalizes understanding. Lab appointment scheduled for 04/26/2016 at 11 am. Patient is unsure of her schedule for next week and will call back to schedule her follow up appointment with Dr.Miller. Rx for Azithromycin 1 gram po x 1 dose 0RF sent to pharmacy on file. Patient is agreeable.  Routing to covering provider as Dr.Miller is out of the office today for final review. Patient agreeable to disposition. Will close encounter.

## 2016-04-24 NOTE — Telephone Encounter (Signed)
-----   Message from Megan Salon, MD sent at 04/21/2016 11:09 PM EST ----- Can you please call this result for me?  Pt has a single ulceration on her buttocks.  I did a culture because I wasn't sure of the cause.  Culture is negative (although being re incubated).  There are three other infectious causes things to consider--HSV, syphilis chancre or chancroid.  I feel certain it is not an HSV lesion and is not classic for the other two.  So, I'd like her to return for blood work for syphilis --just to be sure--and I'd like to treat for chancroid.  This is Azithromycin 1gm po x 1 dose.  Ok to call into pharmacy.  Lab order has been placed.  I'd like to see her again in about a week.  If still present, I will do a skin biopsy at that time.

## 2016-04-26 ENCOUNTER — Other Ambulatory Visit: Payer: Self-pay

## 2016-04-26 ENCOUNTER — Ambulatory Visit
Admission: RE | Admit: 2016-04-26 | Discharge: 2016-04-26 | Disposition: A | Discharge status: Home / Self Care | Payer: 59 | Source: Ambulatory Visit | Attending: Internal Medicine | Admitting: Internal Medicine

## 2016-04-26 ENCOUNTER — Encounter: Payer: Self-pay | Admitting: Endocrinology

## 2016-04-26 ENCOUNTER — Other Ambulatory Visit (INDEPENDENT_AMBULATORY_CARE_PROVIDER_SITE_OTHER): Payer: 59

## 2016-04-26 ENCOUNTER — Ambulatory Visit (INDEPENDENT_AMBULATORY_CARE_PROVIDER_SITE_OTHER): Payer: 59 | Admitting: Endocrinology

## 2016-04-26 VITALS — BP 96/58 | HR 66 | Ht 66.5 in | Wt 208.0 lb

## 2016-04-26 DIAGNOSIS — E161 Other hypoglycemia: Secondary | ICD-10-CM | POA: Diagnosis not present

## 2016-04-26 DIAGNOSIS — N766 Ulceration of vulva: Secondary | ICD-10-CM | POA: Diagnosis not present

## 2016-04-26 DIAGNOSIS — K911 Postgastric surgery syndromes: Secondary | ICD-10-CM

## 2016-04-26 DIAGNOSIS — Z1231 Encounter for screening mammogram for malignant neoplasm of breast: Secondary | ICD-10-CM | POA: Diagnosis not present

## 2016-04-26 NOTE — Patient Instructions (Signed)
No need to check FBS

## 2016-04-26 NOTE — Progress Notes (Signed)
Patient ID: Victoria [name], female   DOB: November 03, 1960, 56 y.o.   MRN: [id number]            Chief complaint: Low blood sugar episodes  History of Present Illness:  Background history:  She does not have a previous history of diabetes Around May 2017 the patient started having symptoms of episodic sweating and shakiness along with the sensation of nearly passing out. She initially went to her gynecologist thinking it was hot flashes. Subsequently patient had more significant symptoms   She tends to have low blood sugars within 1-2 hours of her meals but particularly after breakfast and lunch and usually not after supper  Has not had any overnight low blood sugars except once after midnight Fasting blood sugars: Usually low normal, recent range 66-88 Postprandial readings prior to her first visit: 46-77 after breakfast and 34-97 after lunch from her Accu-Chek monitor download  RECENT history:  Current management, blood sugar review and problems identified:   Since she was having frequent hypoglycemia postprandially 50 after breakfast and lunch she was told to increase her PRECOSE up to 50 mg at breakfast and lunch  She has been trying to do this and is taking the medication right before eating as instructed  With this her blood sugars are not as significantly low or as frequently low, previously was having readings as low as 35 in the afternoon  However now she is using the [employer] brand monitor and not the Accu-Chek; again she is asymptomatic with actively low blood sugars  Most of her low sugars now are in the 60s with occasional 50s  She thinks she is able to go longer without hypoglycemia in the morning hours with using a sausage and Egg McMuffin instead of Greek yogurt and protein bar  However she feels that she needs to eat within 4 hours of her breakfast or lunch to keep from having low sugars  She is recently checking her blood sugars at least 3 times a day including  fasting which are usually normal  In the last 2 weeks or so she has had only one low blood sugar below 65 at lunchtime and has had low sugars twice around bedtime  She did have a low sugar of 48 last night after supper because of eating a brownie, usually otherwise she has a low carbohydrate ice cream for dessert   Treatment of hypoglycemia: Previously treating low sugars with eating a peppermint candy or having grapes or glucose tablets She has had relief of symptoms fairly quickly DIET: Her breakfast is usually McMuffin  [place] yogurt, protein bar or protein shake At lunch may sometimes have a sandwich but otherwise will have a protein shake She said that she is not able to eat on time because of her work schedule   Weight history: Prior to her gastric bypass surgery her weight was 291  Wt Readings from Last 3 Encounters:  04/26/16 208 lb (94.3 kg)  04/19/16 208 lb (94.3 kg)  04/12/16 205 lb 12.8 oz (93.4 kg)      Past Medical History:  Diagnosis Date  . C. difficile diarrhea 10/2010   Infection occurred after father had infection  . Complication of anesthesia    HAD DIFFICULTY SWOLLING SOLID FOOD AFTER INTUBATION FOR PAST SURG  . Degenerative joint disease of low back 09/02/15    Dr. Maureen Ralphs  . GERD (gastroesophageal reflux disease)   . Headache    HX MIGRAINES  . Hemorrhoids   . Hiatal  hernia   . History of transfusion   . HSV-2 infection   . Hx of adenomatous colonic polyps 10/17/07  . Hydradenitis   . Hypothyroidism   . Obesity   . Osteoarthritis    left hip  . Pernicious anemia   . PONV (postoperative nausea and vomiting)   . Varicose veins   . Vitamin D deficiency     Past Surgical History:  Procedure Laterality Date  . ABDOMINAL HYSTERECTOMY  2004   ovaries remain  . BREATH TEK H PYLORI N/A 05/03/2014   Procedure: BREATH TEK H PYLORI;  Surgeon: Kaylyn Lim, MD;  Location: [facility];  Service: General;  Laterality: N/A;  . [place]  .  CHOLECYSTECTOMY  1987  . GASTRIC ROUX-EN-Y N/A 07/06/2014   Procedure: LAPAROSCOPIC ROUX-EN-Y GASTRIC BYPASS, ENTEROLYSIS OF ADHESIONS WITH UPPER ENDOSCOPY;  Surgeon: Johnathan Hausen, MD;  Location: [facility];  Service: General;  Laterality: N/A;  . HAMMER TOE SURGERY    . HIATAL HERNIA REPAIR N/A 07/06/2014   Procedure: LAPAROSCOPIC REPAIR OF HIATAL HERNIA;  Surgeon: Johnathan Hausen, MD;  Location: [facility];  Service: General;  Laterality: N/A;  . HIP ARTHROPLASTY  01/31/09   left  . hsv 2    . JOINT REPLACEMENT  01/2009   l hip (BILATERAL TOTAL HIPS)  . REFRACTIVE SURGERY    . THYROIDECTOMY  1980  . TOTAL HIP ARTHROPLASTY Right 05/21/2012   Procedure: TOTAL HIP ARTHROPLASTY;  Surgeon: Gearlean Alf, MD;  Location: [facility];  Service: Orthopedics;  Laterality: Right;    Family History  Problem Relation Age of Onset  . Diabetes Father   . Diabetes Sister   . Hypertension Sister   . Hypertension Mother     Social History:  reports that she has never smoked. She has never used smokeless tobacco. She reports that she does not drink alcohol or use drugs.  Allergies:  Allergies  Allergen Reactions  . Estradiol Rash    Local rash with estradiol patches    Allergies as of 04/26/2016      Reactions   Estradiol Rash   Local rash with estradiol patches      Medication List       Accurate as of 04/26/16  4:03 PM. Always use your most recent med list.          acarbose 50 MG tablet Commonly known as:  PRECOSE 1 tab before breakfast and 1 tab before lunch.   ACCU-CHEK AVIVA PLUS w/Device Kit   ACCU-CHEK SOFTCLIX LANCETS lancets   ALPRAZolam 0.5 MG tablet Commonly known as:  XANAX TAKE 1 TABLET BY MOUTH AT BEDTIME   azithromycin 250 MG tablet Commonly known as:  ZITHROMAX Take 4 tablets (1 gram total) po x 1 dose.   CALCIUM-VITAMIN D3 PO Take 15 mLs by mouth.   cyanocobalamin 1000 MCG/ML injection Commonly known as:  (VITAMIN B-12) INJECT 1 ML INTRAMUSCULARLY EVERY MONTH     cyclobenzaprine 10 MG tablet Commonly known as:  FLEXERIL TAKE 1 TABLET BY MOUTH AT BEDTIME   Estradiol 1 MG/GM Gel Apply 1 mg topically daily.   glucose blood test strip Commonly known as:  ACCU-CHEK AVIVA PLUS Use for testing glucose up to 8 times a day prn   HYDROcodone-acetaminophen 10-325 MG tablet Commonly known as:  NORCO Take 1 tablet by mouth every 8 (eight) hours as needed (pain).   levothyroxine 100 MCG tablet Commonly known as:  SYNTHROID, LEVOTHROID TAKE 1 TABLET BY MOUTH ONCE DAILY  MULTIVITAMIN PO Take 30 mLs by mouth.   pantoprazole 40 MG tablet Commonly known as:  PROTONIX TAKE 1 TABLET BY MOUTH TWICE DAILY   promethazine 25 MG tablet Commonly known as:  PHENERGAN TAKE 1 TABLET BY MOUTH EVERY 8 HOURS AS NEEDED FOR NAUSEA AND VOMITING   valACYclovir 500 MG tablet Commonly known as:  VALTREX TAKE 1 TABLET BY MOUTH ONCE DAILY       LABS:  No visits with results within 1 Week(s) from this visit.  Latest known visit with results is:  Office Visit on 04/19/2016  Component Date Value Ref Range Status  . Gram Stain 04/19/2016 No WBC Seen   Final  . Gram Stain 04/19/2016 No Squamous Epithelial Cells Seen   Final  . Gram Stain 04/19/2016 No Organisms Seen   Final  . Organism ID, Bacteria 04/19/2016    Final                   Value:NORMAL SKIN FLORA Multiple Organisms Present,None Predominant No Staphylococcus aureus isolated NO GROUP A STREP (S. PYOGENES) ISOLATED        Lab Results  Component Value Date   TSH 2.58 03/12/2016   TSH 2.70 09/09/2015   TSH 2.82 07/05/2015   FREET4 1.0 09/16/2015   FREET4 0.7 (L) 05/26/2015   FREET4 1.76 08/17/2014     Review of Systems  Endocrine: She has had post ablative hypothyroidism, previously had Graves' disease, followed by PCP Her dose has been stable since it was increased in March No unusual fatigue  Lab Results  Component Value Date   TSH 2.58 03/12/2016   TSH 2.70 09/09/2015   TSH 2.82  07/05/2015   FREET4 1.0 09/16/2015   FREET4 0.7 (L) 05/26/2015   FREET4 1.76 08/17/2014     PHYSICAL EXAM:  BP (!) 96/58   Pulse 66   Ht 5' 6.5" (1.689 m)   Wt 208 lb (94.3 kg)   SpO2 98%   BMI 33.07 kg/m      ASSESSMENT:   Post gastrectomy dumping syndrome with reactive hypoglycemia.    She has had Somewhat less problems with hyperglycemia by using Precose 50 mg at breakfast and lunch She is also trying to modify her diet to find better low carbohydrate meals with more protein Recently with adding a meat at breakfast she does better Also overall her tendency to hypoglycemia is improving with less frequent and less severe low sugars   She thinks that her blood sugars tend to be relatively better if she tries to exercise and this may be from improved insulin sensitivity and less feedback insulin production  PLAN:   Continue adjusting her diet to avoid low blood sugars She does not need to check the fasting blood sugar Have discussed day-to-day management and meal planning and glucose monitoring in detail today Discussed how the freestyle Libre sensor would be used and this may be more useful and convenient for her to monitor her blood sugars especially since she is asymptomatic with slightly low blood sugars Continue Precose 50 mg at breakfast and lunch If she is getting low sugars after dinner may need to add 25 mg at dinner also Avoid sweets   Patient Instructions  No need to check FBS   Counseling time on subjects discussed above is over 50% of today's 25 minute visit   KUMAR,AJAY 04/26/2016, 4:03 PM   Note: This office note was prepared with Dragon voice recognition system technology. Any transcriptional errors that result from  this process are unintentional.

## 2016-04-27 LAB — RPR

## 2016-04-30 ENCOUNTER — Encounter: Payer: Self-pay | Admitting: Obstetrics & Gynecology

## 2016-04-30 ENCOUNTER — Ambulatory Visit (INDEPENDENT_AMBULATORY_CARE_PROVIDER_SITE_OTHER): Payer: 59 | Admitting: Obstetrics & Gynecology

## 2016-04-30 ENCOUNTER — Other Ambulatory Visit: Payer: Self-pay

## 2016-04-30 VITALS — BP 100/58 | HR 74 | Resp 16 | Ht 66.5 in | Wt 209.0 lb

## 2016-04-30 DIAGNOSIS — A57 Chancroid: Secondary | ICD-10-CM

## 2016-04-30 DIAGNOSIS — Z79890 Hormone replacement therapy (postmenopausal): Secondary | ICD-10-CM | POA: Diagnosis not present

## 2016-04-30 MED ORDER — GLUCOSE BLOOD VI STRP: ORAL_STRIP | 12 refills | Status: DC

## 2016-05-01 LAB — HIV ANTIBODY (ROUTINE TESTING W REFLEX): HIV 1&2 Ab, 4th Generation: NONREACTIVE

## 2016-05-03 ENCOUNTER — Other Ambulatory Visit: Payer: Self-pay

## 2016-05-03 ENCOUNTER — Other Ambulatory Visit (HOSPITAL_COMMUNITY): Payer: 59

## 2016-05-03 ENCOUNTER — Ambulatory Visit (HOSPITAL_COMMUNITY): Payer: 59 | Attending: Internal Medicine

## 2016-05-03 ENCOUNTER — Ambulatory Visit (INDEPENDENT_AMBULATORY_CARE_PROVIDER_SITE_OTHER): Payer: 59

## 2016-05-03 DIAGNOSIS — R0602 Shortness of breath: Secondary | ICD-10-CM | POA: Insufficient documentation

## 2016-05-03 HISTORY — PX: TRANSTHORACIC ECHOCARDIOGRAM: SHX275

## 2016-05-03 LAB — EXERCISE TOLERANCE TEST
Estimated workload: 8.5 METS
Exercise duration (min): 7 min
Exercise duration (sec): 0 s
MPHR: 165 {beats}/min
Peak HR: 155 {beats}/min
Percent HR: 93 %
Percent of predicted max HR: 93 %
RPE: 17
Rest HR: 62 {beats}/min
Stage 1 DBP: 79 mmHg
Stage 1 Grade: 0 %
Stage 1 HR: 72 {beats}/min
Stage 1 SBP: 101 mmHg
Stage 1 Speed: 0 mph
Stage 2 Grade: 0 %
Stage 2 HR: 71 {beats}/min
Stage 2 Speed: 1 mph
Stage 3 Grade: 0.1 %
Stage 3 HR: 70 {beats}/min
Stage 3 Speed: 1 mph
Stage 4 DBP: 80 mmHg
Stage 4 Grade: 10 %
Stage 4 HR: 101 {beats}/min
Stage 4 SBP: 129 mmHg
Stage 4 Speed: 1.7 mph
Stage 5 DBP: 81 mmHg
Stage 5 Grade: 12 %
Stage 5 HR: 134 {beats}/min
Stage 5 SBP: 142 mmHg
Stage 5 Speed: 2.5 mph
Stage 6 Grade: 14 %
Stage 6 HR: 155 {beats}/min
Stage 6 Speed: 3.4 mph
Stage 7 DBP: 78 mmHg
Stage 7 Grade: 0 %
Stage 7 HR: 120 {beats}/min
Stage 7 SBP: 134 mmHg
Stage 7 Speed: 0 mph
Stage 8 DBP: 77 mmHg
Stage 8 Grade: 0 %
Stage 8 HR: 80 {beats}/min
Stage 8 SBP: 112 mmHg
Stage 8 Speed: 0 mph

## 2016-05-04 NOTE — Progress Notes (Signed)
GYNECOLOGY  VISIT   HPI: 56 y.o. G2P0112 Married [place] American female here for follow up after having single ulcerated lesion noted on her left buttocks midway between rectum and mid back.  Culture was negative.  Possible chancroid as cause.  Treated with Azithromax and pt reports area is almost completely healed.  Syphilis testing was negative.  Reviewed possible etiology with pt and my working diagnosis.    D/w pt HRT.  Using divigel with some skin rash.  Femring not on insurance but will need to try and do a tier exception.  Will have RN work on this and see if can be covered.  GYNECOLOGIC HISTORY: No LMP recorded. Patient has had a hysterectomy. Contraception: PMP Menopausal hormone therapy: divigel  Patient Active Problem List   Diagnosis Date Noted  . Heart palpitations 04/12/2016  . SOB (shortness of breath) 04/12/2016  . 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 Lap gastric bypass May 2016 07/06/2014  . Chest pain 03/11/2013  . History of Clostridium difficile infection 08/12/2011  . Hypothyroidism, postsurgical 08/26/2010  . GE reflux 08/26/2010  . Vitamin D deficiency 08/26/2010  . Fibrocystic breast disease 08/26/2010  . B12 deficiency 08/26/2010  . OBESITY, UNSPECIFIED 03/20/2010  . BACK PAIN 03/20/2010    Past Medical History:  Diagnosis Date  . C. difficile diarrhea 10/2010   Infection occurred after father had infection  . Complication of anesthesia    HAD DIFFICULTY SWOLLING SOLID FOOD AFTER INTUBATION FOR PAST SURG  . Degenerative joint disease of low back 09/02/15    Dr. Maureen Ralphs  . GERD (gastroesophageal reflux disease)   . Headache    HX MIGRAINES  . Hemorrhoids   . Hiatal hernia   . History of transfusion   . HSV-2 infection   . Hx of adenomatous colonic polyps 10/17/07  . Hydradenitis   . Hypothyroidism   . Obesity   . Osteoarthritis    left hip  . Pernicious anemia   . PONV (postoperative nausea and vomiting)    . Varicose veins   . Vitamin D deficiency     Past Surgical History:  Procedure Laterality Date  . ABDOMINAL HYSTERECTOMY  2004   ovaries remain  . BREATH TEK H PYLORI N/A 05/03/2014   Procedure: BREATH TEK H PYLORI;  Surgeon: Kaylyn Lim, MD;  Location: [facility];  Service: General;  Laterality: N/A;  . [place]  . CHOLECYSTECTOMY  1987  . GASTRIC ROUX-EN-Y N/A 07/06/2014   Procedure: LAPAROSCOPIC ROUX-EN-Y GASTRIC BYPASS, ENTEROLYSIS OF ADHESIONS WITH UPPER ENDOSCOPY;  Surgeon: Johnathan Hausen, MD;  Location: [facility];  Service: General;  Laterality: N/A;  . HAMMER TOE SURGERY    . HIATAL HERNIA REPAIR N/A 07/06/2014   Procedure: LAPAROSCOPIC REPAIR OF HIATAL HERNIA;  Surgeon: Johnathan Hausen, MD;  Location: [facility];  Service: General;  Laterality: N/A;  . HIP ARTHROPLASTY  01/31/09   left  . hsv 2    . JOINT REPLACEMENT  01/2009   l hip (BILATERAL TOTAL HIPS)  . REFRACTIVE SURGERY    . THYROIDECTOMY  1980  . TOTAL HIP ARTHROPLASTY Right 05/21/2012   Procedure: TOTAL HIP ARTHROPLASTY;  Surgeon: Gearlean Alf, MD;  Location: [facility];  Service: Orthopedics;  Laterality: Right;    MEDS:  Reviewed in EPIC and UTD  ALLERGIES: Estradiol  Family History  Problem Relation Age of Onset  . Diabetes Father   . Diabetes Sister   . Hypertension Sister   .  Hypertension Mother     SH:  Married, non smoker  Review of Systems  Gastrointestinal: Negative.   Genitourinary: Negative.     PHYSICAL EXAMINATION:    BP (!) 100/58 (BP Location: Right Arm, Patient Position: Sitting, Cuff Size: Normal)   Pulse 74   Resp 16   Ht 5' 6.5" (1.689 m)   Wt 209 lb (94.8 kg)   BMI 33.23 kg/m     General appearance: alert, cooperative and appears stated age  Pelvic: External genitalia:  no lesions              Urethra:  normal appearing urethra with no masses, tenderness or lesions              Bartholins and Skenes: normal                 Buttocks:  Lesion previously noted has  completely filed in and is almost completely healed              Anus:  no lesions  Chaperone was present for exam.  Assessment: Buttocks ulceration that has healed after azithromax dosing, possible chancroid Difficulty with tolerating oral HRT, rash with divigel  Plan: HIV obtained today Will see about tier exception for femring

## 2016-05-15 ENCOUNTER — Encounter: Payer: Self-pay | Admitting: Obstetrics & Gynecology

## 2016-05-15 MED FILL — ACARBOSE 50 MG TAB: 50 | 30 days supply | Qty: 60 | Fill #1

## 2016-05-16 ENCOUNTER — Telehealth: Payer: Self-pay

## 2016-05-16 NOTE — Telephone Encounter (Signed)
Left message to call Loa at [phone].  Visit Follow-Up Question  Message [id number]  From LUNDEN STIEBER To Megan Salon, MD Sent 05/15/2016 8:01 PM  Hello Dr. Sabra Heck  The area on my buttom that you culture and ran test on and treated me with antibotics has returned,i wanted to get your advice on what to do next.   Thank you   Victoria [name]   Responsible Party   Pool - [facility] Pool No one has taken responsibility for this message.  No actions have been taken on this message.

## 2016-05-16 NOTE — Telephone Encounter (Signed)
Telephone encounter created to review symptoms with patient.

## 2016-05-16 NOTE — Telephone Encounter (Signed)
I agree with the appointment with Dr. Sabra Heck.   Cc- Dr. Sabra Heck

## 2016-05-16 NOTE — Telephone Encounter (Signed)
Spoke with patient. Patient states that she has a new sore in the same place on her buttocks that she was seen by [place] for at the beginning of March. Reports the area was very tender yesterday. Patient when to the restroom earlier today and states that the area "ruptured." Had relief with drainage. Reports fluid from the area is a mucous consistency with a red/pink color. Denies any fever or chills.Advised patient she will need to be seen I the office further further evaluation. Patient prefers to see Dr.Miller. Aware Dr.Miller is out of the office today. Offered appointment on 3/22 at 11:15 am, but patient declines. Appointment scheduled for 3/22 at 2:45 pm with Dr.Miller. Patient is agreeable to date and time. Aware if symptoms worsen or develops new symptoms she will need to be seen for further evaluation sooner. Patient is agreeable.  Routing to Dr.Silva as covering provider for review.  Cc: Dr.Miller

## 2016-05-17 ENCOUNTER — Encounter: Payer: Self-pay | Admitting: Obstetrics & Gynecology

## 2016-05-17 ENCOUNTER — Ambulatory Visit (INDEPENDENT_AMBULATORY_CARE_PROVIDER_SITE_OTHER): Payer: 59 | Admitting: Obstetrics & Gynecology

## 2016-05-17 VITALS — BP 104/68 | HR 74 | Temp 97.4°F | Resp 14 | Wt 207.0 lb

## 2016-05-17 DIAGNOSIS — K611 Rectal abscess: Secondary | ICD-10-CM | POA: Diagnosis not present

## 2016-05-17 DIAGNOSIS — N766 Ulceration of vulva: Secondary | ICD-10-CM

## 2016-05-17 DIAGNOSIS — Z79890 Hormone replacement therapy (postmenopausal): Secondary | ICD-10-CM | POA: Diagnosis not present

## 2016-05-17 MED ORDER — ESTRADIOL ACETATE 0.1 MG/24HR VA RING: 1.0000 | VAGINAL_RING | VAGINAL | 4 refills | Status: DC

## 2016-05-17 NOTE — Progress Notes (Signed)
GYNECOLOGY  VISIT   HPI: 56 y.o. G2P0112 Married [place] American female here for complaint of recurrent ulceration on her buttocks.  Pt reports she area seems to have fully healed after her last visit on 05/17/16.  However, it then seemed to become tender again and then almost "fill up" and become tight.  This was associated with pain but no fevers.  Then it ruptured just like it did originally several months ago.  There was associated drainage with this.  It is mildly tender now but still having some drainage.  Denies fever.  Does have some loose stool issues at times due to her gallbladder removal and roux-en-y-gastric bypass.    She still does not have an appt with Dr. Hassell Done yet.  Feels like she is being put off by his office.  Does want to discuss the possibility of undoing her surgery and then considering a sleeve gastrectomy.  Is willing (and may need to anyway) see someone else if she cannot get back in with Dr. Hassell Done at [facility].  We will call for her today.  Also, contemplating short term disability.  Has not made a decision yet about this.  Supervisor at work is great.  Co-workers--not so much.  This causes additional stressors.  Lastly, still working on prior authorization of [place] for pt for HRT use at the divigel causes rashes.    GYNECOLOGIC HISTORY: No LMP recorded. Patient has had a hysterectomy. Contraception: hysterectomy  Menopausal hormone therapy: divigel  Patient Active Problem List   Diagnosis Date Noted  . Heart palpitations 04/12/2016  . SOB (shortness of breath) 04/12/2016  . 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 Lap gastric bypass May 2016 07/06/2014  . Chest pain 03/11/2013  . History of Clostridium difficile infection 08/12/2011  . Hypothyroidism, postsurgical 08/26/2010  . GE reflux 08/26/2010  . Vitamin D deficiency 08/26/2010  . Fibrocystic breast disease 08/26/2010  . B12 deficiency 08/26/2010  .  OBESITY, UNSPECIFIED 03/20/2010  . BACK PAIN 03/20/2010    Past Medical History:  Diagnosis Date  . C. difficile diarrhea 10/2010   Infection occurred after father had infection  . Complication of anesthesia    HAD DIFFICULTY SWOLLING SOLID FOOD AFTER INTUBATION FOR PAST SURG  . Degenerative joint disease of low back 09/02/15    Dr. Maureen Ralphs  . GERD (gastroesophageal reflux disease)   . Headache    HX MIGRAINES  . Hemorrhoids   . Hiatal hernia   . History of transfusion   . HSV-2 infection   . Hx of adenomatous colonic polyps 10/17/07  . Hydradenitis   . Hypothyroidism   . Obesity   . Osteoarthritis    left hip  . Pernicious anemia   . PONV (postoperative nausea and vomiting)   . Varicose veins   . Vitamin D deficiency     Past Surgical History:  Procedure Laterality Date  . ABDOMINAL HYSTERECTOMY  2004   ovaries remain  . BREATH TEK H PYLORI N/A 05/03/2014   Procedure: BREATH TEK H PYLORI;  Surgeon: Kaylyn Lim, MD;  Location: [facility];  Service: General;  Laterality: N/A;  . [place]  . CHOLECYSTECTOMY  1987  . GASTRIC ROUX-EN-Y N/A 07/06/2014   Procedure: LAPAROSCOPIC ROUX-EN-Y GASTRIC BYPASS, ENTEROLYSIS OF ADHESIONS WITH UPPER ENDOSCOPY;  Surgeon: Johnathan Hausen, MD;  Location: [facility];  Service: General;  Laterality: N/A;  . HAMMER TOE SURGERY    . HIATAL HERNIA REPAIR N/A 07/06/2014  Procedure: LAPAROSCOPIC REPAIR OF HIATAL HERNIA;  Surgeon: Johnathan Hausen, MD;  Location: [facility];  Service: General;  Laterality: N/A;  . HIP ARTHROPLASTY  01/31/09   left  . hsv 2    . JOINT REPLACEMENT  01/2009   l hip (BILATERAL TOTAL HIPS)  . REFRACTIVE SURGERY    . THYROIDECTOMY  1980  . TOTAL HIP ARTHROPLASTY Right 05/21/2012   Procedure: TOTAL HIP ARTHROPLASTY;  Surgeon: Gearlean Alf, MD;  Location: [facility];  Service: Orthopedics;  Laterality: Right;    MEDS:  Reviewed in EPIC and UTD  ALLERGIES: Estradiol  Family History  Problem Relation Age of Onset    . Diabetes Father   . Diabetes Sister   . Hypertension Sister   . Hypertension Mother     SH:  Non smoker, married  Review of Systems  Constitutional: Positive for malaise/fatigue.  Respiratory: Negative.   Cardiovascular: Negative.   Genitourinary: Negative.   Musculoskeletal:       Muscle weakness with hypoglycemic episodes  Neurological: Positive for weakness (with hypoglycemic episodes).  Psychiatric/Behavioral: Negative.     PHYSICAL EXAMINATION:    BP 104/68 (BP Location: Right Arm, Patient Position: Sitting, Cuff Size: Normal)   Pulse 74   Temp 97.4 F (36.3 C) (Oral)   Resp 14   Wt 207 lb (93.9 kg)   BMI 32.91 kg/m     Physical Exam  Constitutional: She is oriented to person, place, and time. She appears well-developed and well-nourished.  GI: Soft. Bowel sounds are normal.  Genitourinary: Vagina normal. There is no rash, tenderness, lesion or injury on the right labia. There is no rash, tenderness, lesion or injury on the left labia.  Lymphadenopathy:       Right: No inguinal adenopathy present.       Left: No inguinal adenopathy present.  Neurological: She is alert and oriented to person, place, and time.  Skin: Skin is warm and dry.     Psychiatric: She has a normal mood and affect.   Chaperone was present for exam.  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  MRI of pelvis recommended.  This will be scheduled for pt. HSV culture obtained today, for completeness  Femring 0.1mg , one ring pv q 3 months will be sent to pharmacy for denial from insurance and then will see what needs to be done for possible tier exception   ~30 minutes spent with patient >50% of time was in face to face discussion of above.

## 2016-05-17 NOTE — Progress Notes (Signed)
Call to [facility]. 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 Patient will be contacted directly to schedule work in appointment. Patient is agreeable and aware I will reach out to her tomorrow morning to ensure she is schedule and assist if this has not been scheduled yet.

## 2016-05-18 ENCOUNTER — Telehealth: Payer: Self-pay

## 2016-05-18 NOTE — Telephone Encounter (Signed)
Spoke with patient. Advised of all information as seen below regarding rx for Estradiol acetate ring being an excluded medication on her plan and appointment date and time with [facility]. Also dicussed intermittent FMLA will need to be completed by PCP. Patient verbalizes understanding. Patient states that she will need to reschedule appointment with [facility]. Asking for an appointment on 4/26 or 4/27. Advised I will contact [facility] regarding rescheduling and return call.  Spoke with [facility]. 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 Appointment moved to 06/28/2016 at 9:15 am with Dr.[name] as this is the next available appointment. Patient has been notified and is agreeable. Will call to reschedule with [facility] if needed.  Routing to provider for final review.

## 2016-05-18 NOTE — Telephone Encounter (Signed)
Spoke with [place] who state that the patient's rx for Estradiol acetate 0.1 mg ring is able to be run with insurance but is a non preferred medication and will be $100 a month, $300 for 3 month supply. Spoke with [employer] with pharmacy benefit services with [employer]. Per Jeani Hawking this is a non formulary medication and therefor PA, tier exception and appeal do not apply as it is simply not covered at all with her plan. Per Jeani Hawking if this medication required PA for medical reasoning for medication then these would apply, but since this is not on the formulary none of these can be completed. Jeani Hawking looked up coverage for [place] as well and it is also an exclusion from the plan.   Call to [facility] to check on status of appointment. Per scheduling department the patient has cancelled two follow up appointments. At last OV the patient was advised to follow up with Endocrinology regarding blood sugar levels as this is an ongoing problem that "Dr.[name] does not feel is related to her surgery." Advised patient has followed up with Endocrinology. Advised Dr.Miller would like her to see Dr.[name] for further evaluation. Per representative first available appointment is 06/20/2016 at 10:15 am with Dr.[name]. Appointment scheduled.

## 2016-05-20 LAB — HERPES SIMPLEX VIRUS(HSV) DNA BY PCR
HSV 1 DNA: NOT DETECTED
HSV 2 DNA: NOT DETECTED

## 2016-05-23 ENCOUNTER — Telehealth: Payer: Self-pay | Admitting: Obstetrics & Gynecology

## 2016-05-23 DIAGNOSIS — K611 Rectal abscess: Secondary | ICD-10-CM

## 2016-05-23 NOTE — Telephone Encounter (Signed)
Previous order placed for MRV of pelvis. New order placed for MRI of pelvis -per OV notes dated 05/17/16.   Call to [facility], spoke with Clarise Cruz. Advised new order placed as requested.    Plan: Spoke with GI today.  MRI of pelvis recommended.  This will be scheduled for pt. HSV culture obtained today, for completeness  Femring 0.1mg , one ring pv q 3 months will be sent to pharmacy for denial from insurance and then will see what needs to be done for possible tier exception  Routing to provider for final review. Patient is agreeable to disposition. Will close encounter.  Cc: Lerry Liner

## 2016-05-23 NOTE — Telephone Encounter (Signed)
Victoria [name] from [facility] is calling to have a order changed in [place]. They do not do MRV so it needs to be removed.

## 2016-05-24 MED FILL — DIVIGEL 1 MG GEL PACKET: 1 | 30 days supply | Qty: 30 | Fill #1

## 2016-06-08 ENCOUNTER — Other Ambulatory Visit: Payer: 59 | Admitting: Internal Medicine

## 2016-06-08 ENCOUNTER — Ambulatory Visit
Admission: RE | Admit: 2016-06-08 | Discharge: 2016-06-08 | Disposition: A | Discharge status: Home / Self Care | Payer: 59 | Source: Ambulatory Visit | Attending: Obstetrics & Gynecology | Admitting: Obstetrics & Gynecology

## 2016-06-08 ENCOUNTER — Other Ambulatory Visit: Payer: 59

## 2016-06-08 DIAGNOSIS — K611 Rectal abscess: Secondary | ICD-10-CM

## 2016-06-08 DIAGNOSIS — D72819 Decreased white blood cell count, unspecified: Secondary | ICD-10-CM | POA: Diagnosis not present

## 2016-06-08 LAB — CBC WITH DIFFERENTIAL/PLATELET
Basophils Absolute: 0 {cells}/uL (ref 0–200)
Basophils Relative: 0 %
Eosinophils Absolute: 54 {cells}/uL (ref 15–500)
Eosinophils Relative: 2 %
HCT: 37.8 % (ref 35.0–45.0)
Hemoglobin: 12.1 g/dL (ref 11.7–15.5)
Lymphocytes Relative: 48 %
Lymphs Abs: 1296 {cells}/uL (ref 850–3900)
MCH: 31.9 pg (ref 27.0–33.0)
MCHC: 32 g/dL (ref 32.0–36.0)
MCV: 99.7 fL (ref 80.0–100.0)
MPV: 10.9 fL (ref 7.5–12.5)
Monocytes Absolute: 216 {cells}/uL (ref 200–950)
Monocytes Relative: 8 %
Neutro Abs: 1134 {cells}/uL — ABNORMAL LOW (ref 1500–7800)
Neutrophils Relative %: 42 %
Platelets: 214 10*3/uL (ref 140–400)
RBC: 3.79 MIL/uL — ABNORMAL LOW (ref 3.80–5.10)
RDW: 13.7 % (ref 11.0–15.0)
WBC: 2.7 10*3/uL — ABNORMAL LOW (ref 3.8–10.8)

## 2016-06-11 ENCOUNTER — Ambulatory Visit
Admission: RE | Admit: 2016-06-11 | Discharge: 2016-06-11 | Disposition: A | Discharge status: Home / Self Care | Payer: 59 | Source: Ambulatory Visit | Attending: Obstetrics & Gynecology | Admitting: Obstetrics & Gynecology

## 2016-06-11 DIAGNOSIS — S31809A Unspecified open wound of unspecified buttock, initial encounter: Secondary | ICD-10-CM | POA: Diagnosis not present

## 2016-06-11 MED ORDER — GADOBENATE DIMEGLUMINE 529 MG/ML IV SOLN
20.0000 mL | Freq: Once | INTRAVENOUS | Status: AC | PRN
  Administered 2016-06-11: 20 mL via INTRAVENOUS

## 2016-06-13 ENCOUNTER — Telehealth: Payer: Self-pay

## 2016-06-13 MED FILL — CYCLOBENZAPRINE 10 MG TAB: 10 | 30 days supply | Qty: 30 | Fill #1

## 2016-06-13 MED FILL — ALPRAZolam 0.5 MG TABS: 0.5 | 30 days supply | Qty: 30 | Fill #1

## 2016-06-13 NOTE — Telephone Encounter (Signed)
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  I think she needs to see one of the general surgeons so this recurrent buttocks infection can be opened and fully drained to see if it will finally heal.  Would she be ok with this?  If so, needs referral to general surgery for recurrent buttocks skin infection.  Thanks.

## 2016-06-13 NOTE — Telephone Encounter (Signed)
Spoke with patient. Advised of results and message as seen below from Alexander. Patient verbalizes understanding. Patient states she has a lot going on with her health. Has a low WBC and is being referred to hematology. Would like to think about referral to general surgeon and return call to let Dr.Miller know how she would like to proceed. For now does not desire to proceed at this time.  Routing to provider for final review. Patient agreeable to disposition. Will close encounter.

## 2016-06-14 MED FILL — ACARBOSE 50 MG TAB: 50 | 30 days supply | Qty: 60 | Fill #2

## 2016-06-18 ENCOUNTER — Telehealth: Payer: Self-pay | Admitting: *Deleted

## 2016-06-18 ENCOUNTER — Encounter: Payer: Self-pay | Admitting: Hematology and Oncology

## 2016-06-18 ENCOUNTER — Telehealth: Payer: Self-pay | Admitting: Hematology and Oncology

## 2016-06-18 NOTE — Telephone Encounter (Signed)
Appt has been scheduled for the pt to see Dr. Alvy Bimler on 5/14 at 230pm. Left a vm w/appt date and time. Letter mailed to the pt.

## 2016-06-18 NOTE — Telephone Encounter (Signed)
"  This is Arts development officer with Dr. Verlene Mayer office calling to schedule an apointment for this patient."  Provided office fax number and transferred to new patient coordinator.  Referral was entered via EPIC.

## 2016-06-20 ENCOUNTER — Telehealth: Payer: Self-pay | Admitting: Obstetrics & Gynecology

## 2016-06-20 DIAGNOSIS — K610 Anal abscess: Secondary | ICD-10-CM

## 2016-06-20 NOTE — Telephone Encounter (Signed)
Spoke with patient. Reports the area on her buttocks has returned over the last few day and that she would like to proceed with referral to general surgery at this time. States that she has seen Dr.[name] in the past and tried to schedule an appointment with him, but will need a referral. Advised referral will be placed at this time for scheduling. Patient is agreeable.  Routing to Theresia Lo for referral scheduling as soon as possible.  Routing to provider for final review. Patient agreeable to disposition. Will close encounter.

## 2016-06-20 NOTE — Telephone Encounter (Signed)
Patient calling to speak with nurse about an MRI she had done and a referral to a general surgeon.

## 2016-06-28 ENCOUNTER — Telehealth: Payer: Self-pay | Admitting: Skilled Nursing Facility1

## 2016-06-28 ENCOUNTER — Other Ambulatory Visit: Payer: Self-pay | Admitting: Internal Medicine

## 2016-06-28 ENCOUNTER — Encounter: Payer: Self-pay | Admitting: Endocrinology

## 2016-06-28 ENCOUNTER — Ambulatory Visit (INDEPENDENT_AMBULATORY_CARE_PROVIDER_SITE_OTHER): Payer: 59 | Admitting: Endocrinology

## 2016-06-28 ENCOUNTER — Other Ambulatory Visit: Payer: Self-pay

## 2016-06-28 VITALS — BP 110/76 | HR 55 | Ht 66.5 in | Wt 209.6 lb

## 2016-06-28 DIAGNOSIS — K911 Postgastric surgery syndromes: Secondary | ICD-10-CM | POA: Diagnosis not present

## 2016-06-28 DIAGNOSIS — E161 Other hypoglycemia: Secondary | ICD-10-CM

## 2016-06-28 DIAGNOSIS — K603 Anal fistula: Secondary | ICD-10-CM | POA: Diagnosis not present

## 2016-06-28 DIAGNOSIS — E162 Hypoglycemia, unspecified: Secondary | ICD-10-CM | POA: Diagnosis not present

## 2016-06-28 DIAGNOSIS — Z9884 Bariatric surgery status: Secondary | ICD-10-CM | POA: Diagnosis not present

## 2016-06-28 MED ORDER — GLUCOSE BLOOD VI STRP: ORAL_STRIP | 2 refills | Status: DC

## 2016-06-28 MED FILL — LEVOTHYROXINE 100 MCG TAB: 100 | 90 days supply | Qty: 90 | Fill #0

## 2016-06-28 MED FILL — CYANOCOBALAMIN 1,000 MCG/ML: 1000 | 84 days supply | Qty: 3 | Fill #2

## 2016-06-28 NOTE — Progress Notes (Signed)
Patient ID: Victoria [name], female   DOB: [birth year], 56 y.o.   MRN: [id number]            Chief complaint: Low blood sugar episodes  History of Present Illness:  Background history:  She does not have a previous history of diabetes Around May 2017 the patient started having symptoms of episodic sweating and shakiness along with the sensation of nearly passing out. She initially went to her gynecologist thinking it was hot flashes. Subsequently patient had more significant symptoms   She tends to have low blood sugars within 1-2 hours of her meals but particularly after breakfast and lunch and usually not after supper  Has not had any overnight low blood sugars except once after midnight Fasting blood sugars: Usually low normal, recent range 66-88 Postprandial readings prior to her first visit: 46-77 after breakfast and 34-97 after lunch from her Accu-Chek monitor download  RECENT history:  Current management, blood sugar review and problems identified:   She again is checking her glucose with a [employer] brand meter  She has not checked blood sugar routinely very much except when she was on vacation  Again she has low low sugars fasting  May tend to have relatively low readings occasionally before lunch but only has 1 or 2 readings below 73 year old lunchtime  Has only 2 significant episodes of blood sugars in the 40s with symptoms, once when she was eating a lot of carbohydrates on vacation  She is trying to get protein with each meal but she thinks her main problem is not being able to get to her lunch or get a snack and time while at work  Has only one or 2 readings after supper  Blood sugar patterns:  Mean values apply above for all meters except median for One Touch  PRE-MEAL Fasting Lunch Dinner Bedtime Overall  Glucose range:  69-1 03  58-85   40-89  87    Mean/median: 79     81    Treatment of hypoglycemia: Previously treating low sugars with eating a peppermint  candy or having grapes or glucose tablets She has had relief of symptoms fairly quickly  DIET: Her breakfast is usually sausage biscuit, sometimes [place] yogurt, protein bar or protein shake At lunch may sometimes have a sandwich but otherwise will have a protein shake She said that she is not able to eat on time because of her work schedule  Weight history: Prior to her gastric bypass surgery her weight was 291  Wt Readings from Last 3 Encounters:  06/28/16 209 lb 9.6 oz (95.1 kg)  05/17/16 207 lb (93.9 kg)  04/30/16 209 lb (94.8 kg)      Past Medical History:  Diagnosis Date  . C. difficile diarrhea 10/2010   Infection occurred after father had infection  . Complication of anesthesia    HAD DIFFICULTY SWOLLING SOLID FOOD AFTER INTUBATION FOR PAST SURG  . Degenerative joint disease of low back 09/02/15    Dr. Maureen Ralphs  . GERD (gastroesophageal reflux disease)   . Headache    HX MIGRAINES  . Hemorrhoids   . Hiatal hernia   . History of transfusion   . HSV-2 infection   . Hx of adenomatous colonic polyps 10/17/07  . Hydradenitis   . Hypothyroidism   . Obesity   . Osteoarthritis    left hip  . Pernicious anemia   . PONV (postoperative nausea and vomiting)   . Varicose veins   . Vitamin D deficiency  Past Surgical History:  Procedure Laterality Date  . ABDOMINAL HYSTERECTOMY  2004   ovaries remain  . BREATH TEK H PYLORI N/A 05/03/2014   Procedure: BREATH TEK H PYLORI;  Surgeon: Kaylyn Lim, MD;  Location: [facility];  Service: General;  Laterality: N/A;  . [place]  . CHOLECYSTECTOMY  1987  . GASTRIC ROUX-EN-Y N/A 07/06/2014   Procedure: LAPAROSCOPIC ROUX-EN-Y GASTRIC BYPASS, ENTEROLYSIS OF ADHESIONS WITH UPPER ENDOSCOPY;  Surgeon: Johnathan Hausen, MD;  Location: [facility];  Service: General;  Laterality: N/A;  . HAMMER TOE SURGERY    . HIATAL HERNIA REPAIR N/A 07/06/2014   Procedure: LAPAROSCOPIC REPAIR OF HIATAL HERNIA;  Surgeon: Johnathan Hausen, MD;   Location: [facility];  Service: General;  Laterality: N/A;  . HIP ARTHROPLASTY  01/31/09   left  . hsv 2    . JOINT REPLACEMENT  01/2009   l hip (BILATERAL TOTAL HIPS)  . REFRACTIVE SURGERY    . THYROIDECTOMY  1980  . TOTAL HIP ARTHROPLASTY Right 05/21/2012   Procedure: TOTAL HIP ARTHROPLASTY;  Surgeon: Gearlean Alf, MD;  Location: [facility];  Service: Orthopedics;  Laterality: Right;    Family History  Problem Relation Age of Onset  . Diabetes Father   . Diabetes Sister   . Hypertension Sister   . Hypertension Mother     Social History:  reports that she has never smoked. She has never used smokeless tobacco. She reports that she does not drink alcohol or use drugs.  Allergies:  Allergies  Allergen Reactions  . Estradiol Rash    Local rash with estradiol patches    Allergies as of 06/28/2016      Reactions   Estradiol Rash   Local rash with estradiol patches      Medication List       Accurate as of 06/28/16  9:09 AM. Always use your most recent med list.          acarbose 50 MG tablet Commonly known as:  PRECOSE 1 tab before breakfast and 1 tab before lunch.   ACCU-CHEK AVIVA PLUS w/Device Kit   ACCU-CHEK SOFTCLIX LANCETS lancets   ALPRAZolam 0.5 MG tablet Commonly known as:  XANAX TAKE 1 TABLET BY MOUTH AT BEDTIME   CALCIUM-VITAMIN D3 PO Take 15 mLs by mouth.   cyanocobalamin 1000 MCG/ML injection Commonly known as:  (VITAMIN B-12) INJECT 1 ML INTRAMUSCULARLY EVERY MONTH   cyclobenzaprine 10 MG tablet Commonly known as:  FLEXERIL TAKE 1 TABLET BY MOUTH AT BEDTIME   Estradiol 1 MG/GM Gel Apply 1 mg topically daily.   Estradiol Acetate 0.1 MG/24HR Ring Place 1 each vaginally every 3 (three) months.   glucose blood test strip Commonly known as:  ACCU-CHEK GUIDE Use to test blood sugar 4 times daily   HYDROcodone-acetaminophen 10-325 MG tablet Commonly known as:  NORCO Take 1 tablet by mouth every 8 (eight) hours as needed (pain).   levothyroxine  100 MCG tablet Commonly known as:  SYNTHROID, LEVOTHROID TAKE 1 TABLET BY MOUTH ONCE DAILY   MULTIVITAMIN PO Take 30 mLs by mouth.   pantoprazole 40 MG tablet Commonly known as:  PROTONIX TAKE 1 TABLET BY MOUTH TWICE DAILY   promethazine 25 MG tablet Commonly known as:  PHENERGAN TAKE 1 TABLET BY MOUTH EVERY 8 HOURS AS NEEDED FOR NAUSEA AND VOMITING   valACYclovir 500 MG tablet Commonly known as:  VALTREX TAKE 1 TABLET BY MOUTH ONCE DAILY  Review of Systems  Endocrine: She has had post ablative hypothyroidism, previously had Graves' disease, followed by PCP Her dose has been stable No unusual fatigue  Lab Results  Component Value Date   TSH 2.58 03/12/2016   TSH 2.70 09/09/2015   TSH 2.82 07/05/2015   FREET4 1.0 09/16/2015   FREET4 0.7 (L) 05/26/2015   FREET4 1.76 08/17/2014     PHYSICAL EXAM:  BP 110/76   Pulse (!) 55   Ht 5' 6.5" (1.689 m)   Wt 209 lb 9.6 oz (95.1 kg)   SpO2 97%   BMI 33.32 kg/m    ASSESSMENT:   Post gastrectomy dumping syndrome with reactive hypoglycemia.    She has had Minimal hypoglycemia that is being documented She however does not always get symptoms unless blood sugars are in the 40s Probably is benefiting from taking Precose 50 mg at lunch and dinner Discussed day-to-day management of meal planning, causation of reactive hypoglycemia and need for protein with every meal and snacks in between  Not clear how often she may be getting asymptomatic low blood sugars as she is not monitoring very often, currently has only a couple of readings below 60 that are asymptomatic  The 2 readings that she had with hypoglycemic symptoms were related to poor diet  HYPOTHYROIDISM: She will be following up with PCP in July   PLAN:   Continue Precose 50 mg at breakfast and lunch, she is reluctant to increase the dose Needs some more readings after evening meal She is interested in the continuous glucose monitoring and since the  personal version is not available on her insurance will schedule her for the professional version, she will also keep a food diary with this  Start using Accu-Chek guide meter  She will also follow-up with her bariatric surgeon today  There are no Patient Instructions on file for this visit.    KUMAR,AJAY 06/28/2016, 9:09 AM   Note: This office note was prepared with Estate agent. Any transcriptional errors that result from this process are unintentional.

## 2016-06-28 NOTE — Telephone Encounter (Signed)
Pt states she needed an earlier appointment to discuss vitamins.  Dietitian worked with pt to find an earlier appointment that worked with the pt and providers schedules.

## 2016-07-02 ENCOUNTER — Telehealth: Payer: Self-pay | Admitting: Endocrinology

## 2016-07-02 MED ORDER — FREESTYLE LITE DEVI: 2 refills | Status: DC

## 2016-07-02 MED ORDER — FREESTYLE LANCETS MISC: 2 refills | Status: DC

## 2016-07-02 MED ORDER — GLUCOSE BLOOD VI STRP: ORAL_STRIP | 2 refills | Status: DC

## 2016-07-02 MED FILL — FREESTYLE LITE METER: 1 days supply | Qty: 1 | Fill #0

## 2016-07-02 MED FILL — FREESTYLE LITE TEST STRIP: 90 days supply | Qty: 200 | Fill #0

## 2016-07-02 NOTE — Telephone Encounter (Signed)
Victoria [name] Self [phone]  Patient called to say that her current strips are not covered by [facility] anymore and she needs a different meter and strips called into the outpatient pharmacy at [facility]. Please call and let her know when it has been sent to pharmacy.

## 2016-07-02 NOTE — Telephone Encounter (Signed)
I contacted the patient and advised RX for freestyle lite test strips, meter and lancets have been submitted to the pharmacy. Pateint voiced understanding and had no further questions at this time.

## 2016-07-09 ENCOUNTER — Encounter: Payer: Self-pay | Admitting: Hematology and Oncology

## 2016-07-09 ENCOUNTER — Ambulatory Visit (HOSPITAL_BASED_OUTPATIENT_CLINIC_OR_DEPARTMENT_OTHER): Payer: 59

## 2016-07-09 ENCOUNTER — Ambulatory Visit (HOSPITAL_BASED_OUTPATIENT_CLINIC_OR_DEPARTMENT_OTHER): Payer: 59 | Admitting: Hematology and Oncology

## 2016-07-09 ENCOUNTER — Telehealth: Payer: Self-pay | Admitting: Hematology and Oncology

## 2016-07-09 VITALS — BP 115/59 | HR 67 | Temp 98.5°F | Resp 18 | Ht 66.5 in | Wt 212.8 lb

## 2016-07-09 DIAGNOSIS — E610 Copper deficiency: Secondary | ICD-10-CM

## 2016-07-09 DIAGNOSIS — K603 Anal fistula: Secondary | ICD-10-CM | POA: Diagnosis not present

## 2016-07-09 DIAGNOSIS — D72819 Decreased white blood cell count, unspecified: Secondary | ICD-10-CM | POA: Diagnosis not present

## 2016-07-09 DIAGNOSIS — Z9884 Bariatric surgery status: Secondary | ICD-10-CM

## 2016-07-09 LAB — CBC WITH DIFFERENTIAL/PLATELET
BASO%: 0.9 % (ref 0.0–2.0)
Basophils Absolute: 0 10*3/uL (ref 0.0–0.1)
EOS%: 10.8 % — ABNORMAL HIGH (ref 0.0–7.0)
Eosinophils Absolute: 0.4 10*3/uL (ref 0.0–0.5)
HCT: 38.2 % (ref 34.8–46.6)
HGB: 12.6 g/dL (ref 11.6–15.9)
LYMPH%: 38.5 % (ref 14.0–49.7)
MCH: 31.9 pg (ref 25.1–34.0)
MCHC: 32.9 g/dL (ref 31.5–36.0)
MCV: 96.9 fL (ref 79.5–101.0)
MONO#: 0.3 10*3/uL (ref 0.1–0.9)
MONO%: 7.2 % (ref 0.0–14.0)
NEUT#: 1.8 10*3/uL (ref 1.5–6.5)
NEUT%: 42.6 % (ref 38.4–76.8)
Platelets: 176 10*3/uL (ref 145–400)
RBC: 3.95 10*6/uL (ref 3.70–5.45)
RDW: 13.5 % (ref 11.2–14.5)
WBC: 4.1 10*3/uL (ref 3.9–10.3)
lymph#: 1.6 10*3/uL (ref 0.9–3.3)

## 2016-07-09 NOTE — Telephone Encounter (Signed)
Gave patient AVS - no additional appts needed per los

## 2016-07-10 DIAGNOSIS — K603 Anal fistula, unspecified: Secondary | ICD-10-CM | POA: Insufficient documentation

## 2016-07-10 LAB — SEDIMENTATION RATE: Sedimentation Rate-Westergren: 6 mm/hr (ref 0–40)

## 2016-07-10 NOTE — Assessment & Plan Note (Signed)
The cause of her perianal fistula is unknown. The patient did had history of Clostridium difficile in 2012. I wonder that could be related to the causes of her perianal fistula. She has appointment to see general surgery for further evaluation and would defer to them for further management

## 2016-07-10 NOTE — Progress Notes (Signed)
Many thanks. 

## 2016-07-10 NOTE — Progress Notes (Signed)
[facility] CONSULT NOTE  Patient Care Team: Baxley, Cresenciano Lick, MD as PCP - General [facility] Ellamae Sia, RD as Dietitian (Family Medicine)  CHIEF COMPLAINTS/PURPOSE OF CONSULTATION:  Chronic, intermittent leukopenia and history of recurrent infection  HISTORY OF PRESENTING ILLNESS:  Victoria [name] 56 y.o. female is here because of chronic intermittent leukopenia .  She was found to have abnormal CBC from blood count monitoring. I have the opportunity to review her CBC with the patient dated back all the way to 2010. Her white blood cell count fluctuated between 2.3-11. She has various different types of infections in her lifetime. She had history of herpes simplex involvement of her genital area that comes and goes. She had history of chronic suppurative hydradenitis more than 15 years ago requiring incision and drainage.  She has residual knot on the left axilla that does not bother her from prior infection Around the year of 2012, she developed tooth infection/abscess.  She was treated with 2 courses of antibiotics by her dentist complicated by Clostridium difficile infection in 2012. Most recently, she developed perianal "boil"/fistula several months ago that intermittently drain.  She had recent MRI with general surgery appointment pending for surgical management. She denies other forms of infection such as nasal drainage, respiratory tract infection, urinary tract infection or others. 1 of her twin daughters had chronic suppurative hydradenitis as well. The patient had history of thyroid surgery in the past for hyperactive thyroid/goiter. She does not smoke or drink alcohol. 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 2 years ago, she underwent gastric bypass surgery.  She is taking chronic B12 injection and vitamin D supplementation.  She denies recent infection. The last prescription antibiotics was more than 3 months ago Currently, she does  not have symptoms of sinus congestion, cough, urinary frequency/urgency or dysuria, diarrhea, joint swelling/pain or abnormal skin rash.  She had no prior history or diagnosis of cancer. Her age appropriate screening programs are up-to-date. The patient has no prior diagnosis of autoimmune disease and was not prescribed corticosteroids related products.  MEDICAL HISTORY:  Past Medical History:  Diagnosis Date  . C. difficile diarrhea 10/2010   Infection occurred after father had infection  . Complication of anesthesia    HAD DIFFICULTY SWOLLING SOLID FOOD AFTER INTUBATION FOR PAST SURG  . Degenerative joint disease of low back 09/02/15    Dr. Maureen Ralphs  . GERD (gastroesophageal reflux disease)   . Headache    HX MIGRAINES  . Hemorrhoids   . Hiatal hernia   . History of transfusion   . HSV-2 infection   . Hx of adenomatous colonic polyps 10/17/07  . Hydradenitis   . Hypothyroidism   . Obesity   . Osteoarthritis    left hip  . Pernicious anemia   . PONV (postoperative nausea and vomiting)   . Varicose veins   . Vitamin D deficiency     SURGICAL HISTORY: Past Surgical History:  Procedure Laterality Date  . ABDOMINAL HYSTERECTOMY  2004   ovaries remain  . BREATH TEK H PYLORI N/A 05/03/2014   Procedure: BREATH TEK H PYLORI;  Surgeon: Kaylyn Lim, MD;  Location: [facility];  Service: General;  Laterality: N/A;  . [place]  . CHOLECYSTECTOMY  1987  . GASTRIC ROUX-EN-Y N/A 07/06/2014   Procedure: LAPAROSCOPIC ROUX-EN-Y GASTRIC BYPASS, ENTEROLYSIS OF ADHESIONS WITH UPPER ENDOSCOPY;  Surgeon: Johnathan Hausen, MD;  Location: [facility];  Service: General;  Laterality: N/A;  . HAMMER TOE SURGERY    . HIATAL HERNIA REPAIR N/A 07/06/2014   Procedure: LAPAROSCOPIC REPAIR OF HIATAL HERNIA;  Surgeon: Johnathan Hausen, MD;  Location: [facility];  Service: General;  Laterality: N/A;  . HIP ARTHROPLASTY  01/31/09   left  . hsv 2    . JOINT REPLACEMENT  01/2009   l hip (BILATERAL TOTAL HIPS)   . REFRACTIVE SURGERY    . THYROIDECTOMY  1980  . TOTAL HIP ARTHROPLASTY Right 05/21/2012   Procedure: TOTAL HIP ARTHROPLASTY;  Surgeon: Gearlean Alf, MD;  Location: [facility];  Service: Orthopedics;  Laterality: Right;    SOCIAL HISTORY: Social History   Social History  . Marital status: Married    Spouse name: N/A  . Number of children: 2  . Years of education: N/A   Occupational History  . nurse    Social History Main Topics  . Smoking status: Never Smoker  . Smokeless tobacco: Never Used  . Alcohol use No  . Drug use: No  . Sexual activity: Yes    Birth control/ protection: Surgical   Other Topics Concern  . Not on file   Social History Narrative  . No narrative on file    FAMILY HISTORY: Family History  Problem Relation Age of Onset  . Diabetes Father   . Diabetes Sister   . Hypertension Sister   . Hypertension Mother     ALLERGIES:  is allergic to estradiol.  MEDICATIONS:  Current Outpatient Prescriptions  Medication Sig Dispense Refill  . acarbose (PRECOSE) 50 MG tablet 1 tab before breakfast and 1 tab before lunch. 60 tablet 2  . ACCU-CHEK SOFTCLIX LANCETS lancets   0  . ALPRAZolam (XANAX) 0.5 MG tablet TAKE 1 TABLET BY MOUTH AT BEDTIME 30 tablet 5  . Blood Glucose Monitoring Suppl (FREESTYLE LITE) DEVI Use to check blood sugar 2 times per day. 1 each 2  . Calcium Carbonate-Vitamin D (CALCIUM-VITAMIN D3 PO) Take 15 mLs by mouth.    . cyanocobalamin (,VITAMIN B-12,) 1000 MCG/ML injection INJECT 1 ML INTRAMUSCULARLY EVERY MONTH 25 mL 0  . cyclobenzaprine (FLEXERIL) 10 MG tablet TAKE 1 TABLET BY MOUTH AT BEDTIME 30 tablet 5  . Estradiol 1 MG/GM GEL Apply 1 mg topically daily. 30 g 2  . Estradiol Acetate 0.1 MG/24HR RING Place 1 each vaginally every 3 (three) months. (Patient not taking: Reported on 06/28/2016) 1 each 4  . glucose blood (FREESTYLE LITE) test strip Use to check blood sugar 2 times per day. 200 each 2  . HYDROcodone-acetaminophen (NORCO)  10-325 MG tablet Take 1 tablet by mouth every 8 (eight) hours as needed (pain).    . Lancets (FREESTYLE) lancets Use to check blood sugar 2 times per day. 200 each 2  . levothyroxine (SYNTHROID, LEVOTHROID) 100 MCG tablet TAKE 1 TABLET BY MOUTH ONCE DAILY 90 tablet 1  . Multiple Vitamins-Minerals (MULTIVITAMIN PO) Take 30 mLs by mouth.    . pantoprazole (PROTONIX) 40 MG tablet TAKE 1 TABLET BY MOUTH TWICE DAILY 60 tablet 11  . promethazine (PHENERGAN) 25 MG tablet TAKE 1 TABLET BY MOUTH EVERY 8 HOURS AS NEEDED FOR NAUSEA AND VOMITING 30 tablet 1  . valACYclovir (VALTREX) 500 MG tablet TAKE 1 TABLET BY MOUTH ONCE DAILY (Patient not taking: Reported on 06/28/2016) 90 tablet 3   No current facility-administered medications for this visit.     REVIEW OF SYSTEMS:   Constitutional: Denies fevers, chills or abnormal night sweats Eyes: Denies blurriness  of vision, double vision or watery eyes Ears, nose, mouth, throat, and face: Denies mucositis or sore throat Respiratory: Denies cough, dyspnea or wheezes Cardiovascular: Denies palpitation, chest discomfort or lower extremity swelling Gastrointestinal:  Denies nausea, heartburn or change in bowel habits Skin: Denies abnormal skin rashes Lymphatics: Denies new lymphadenopathy or easy bruising Neurological:Denies numbness, tingling or new weaknesses Behavioral/Psych: Mood is stable, no new changes  All other systems were reviewed with the patient and are negative.  PHYSICAL EXAMINATION: ECOG PERFORMANCE STATUS: 1 - Symptomatic but completely ambulatory  Vitals:   07/09/16 1504  BP: (!) 115/59  Pulse: 67  Resp: 18  Temp: 98.5 F (36.9 C)   Filed Weights   07/09/16 1504  Weight: 212 lb 12.8 oz (96.5 kg)    GENERAL:alert, no distress and comfortable SKIN: skin color, texture, turgor are normal, no rashes or significant lesions EYES: normal, conjunctiva are pink and non-injected, sclera clear OROPHARYNX:no exudate, no erythema and lips,  buccal mucosa, and tongue normal  NECK: Noted well-healed surgical scar  LYMPH:  no palpable lymphadenopathy in the cervical, axillary or inguinal LUNGS: clear to auscultation and percussion with normal breathing effort HEART: regular rate & rhythm and no murmurs and no lower extremity edema ABDOMEN:abdomen soft, non-tender and normal bowel sounds Musculoskeletal:no cyanosis of digits and no clubbing  PSYCH: alert & oriented x 3 with fluent speech NEURO: no focal motor/sensory deficits  LABORATORY DATA:  I have reviewed the data as listed Recent Results (from the past 2160 hour(s))  CBC with Differential/Platelet     Status: Abnormal   Collection Time: 04/12/16 11:17 AM  Result Value Ref Range   WBC 3.5 (L) 3.8 - 10.8 K/uL   RBC 3.97 3.80 - 5.10 MIL/uL   Hemoglobin 12.6 11.7 - 15.5 g/dL   HCT 39.4 35.0 - 45.0 %   MCV 99.2 80.0 - 100.0 fL   MCH 31.7 27.0 - 33.0 pg   MCHC 32.0 32.0 - 36.0 g/dL   RDW 13.8 11.0 - 15.0 %   Platelets 233 140 - 400 K/uL   MPV 11.2 7.5 - 12.5 fL   Neutro Abs 1,715 1,500 - 7,800 cells/uL   Lymphs Abs 1,365 850 - 3,900 cells/uL   Monocytes Absolute 315 200 - 950 cells/uL   Eosinophils Absolute 70 15 - 500 cells/uL   Basophils Absolute 35 0 - 200 cells/uL   Neutrophils Relative % 49 %   Lymphocytes Relative 39 %   Monocytes Relative 9 %   Eosinophils Relative 2 %   Basophils Relative 1 %   Smear Review Criteria for review not met   WOUND CULTURE     Status: None   Collection Time: 04/19/16  3:27 PM  Result Value Ref Range   Gram Stain No WBC Seen    Gram Stain No Squamous Epithelial Cells Seen    Gram Stain No Organisms Seen    Organism ID, Bacteria      NORMAL SKIN FLORA Multiple Organisms Present,None Predominant No Staphylococcus aureus isolated NO GROUP A STREP (S. PYOGENES) ISOLATED     Comment: NO PSEUDOMONAS AERUGINOSA ISOLATED  RPR     Status: None   Collection Time: 04/26/16 12:12 PM  Result Value Ref Range   RPR Ser Ql NON REAC NON  REAC  HIV antibody     Status: None   Collection Time: 04/30/16  5:21 PM  Result Value Ref Range   HIV 1&2 Ab, 4th Generation NONREACTIVE NONREACTIVE    Comment:  HIV-1 antigen and HIV-1/HIV-2 antibodies were not detected.  There is no laboratory evidence of HIV infection.   HIV-1/2 Antibody Diff        Not indicated. HIV-1 RNA, Qual TMA          Not indicated.     PLEASE NOTE: This information has been disclosed to you from records whose confidentiality may be protected by state law. If your state requires such protection, then the state law prohibits you from making any further disclosure of the information without the specific written consent of the person to whom it pertains, or as otherwise permitted by law. A general authorization for the release of medical or other information is NOT sufficient for this purpose.   The performance of this assay has not been clinically validated in patients less than 62 years old.   For additional information please refer to http://education.[facility].com/faq/FAQ106.  (This link is being provided for informational/educational purposes only.)     EXERCISE TOLERANCE TEST     Status: None   Collection Time: 05/03/16  2:31 PM  Result Value Ref Range   Rest HR 62 bpm   Rest BP 101/79 mmHg   RPE 17    Exercise duration (sec) 0 sec   Percent HR 93 %   Exercise duration (min) 7 min   Estimated workload 8.5 METS   Peak HR 155 BPM   Peak BP  mmHg   MPHR 165 bpm   Phase 1 name PRETEST    Stage 1 Name SITTING    Stage 1 Time 00:01:12    Stage 1 Speed 0.0 mph   Stage 1 Grade 0.0 %   Stage 1 HR 72 bpm   Stage 1 SBP 101 mmHg   Stage 1 DBP 79 mmHg   Phase 2 Name PRETEST    Stage 2 Name WARM-UP    Stage 2 Time 00:00:07    Stage 2 Speed 1.0 mph   Stage 2 Grade 0.0 %   Stage 2 HR 71 bpm   Phase 3 Name Exercise    Stage 3 Name STAGE 1    Stage 3 Attribute Baseline    Stage 3 Time 00:00:01    Stage 3 Speed 1.0 mph   Stage 3 Grade  0.1 %   Stage 3 HR 70 bpm   Phase 4 Name Exercise    Stage 4 Name STAGE 1    Stage 4 Time 00:03:00    Stage 4 Speed 1.7 mph   Stage 4 Grade 10.0 %   Stage 4 HR 101 bpm   Stage 4 SBP 129 mmHg   Stage 4 DBP 80 mmHg   Phase 5 Name Exercise    Stage 5 Name STAGE 2    Stage 5 Time 00:03:00    Stage 5 Speed 2.5 mph   Stage 5 Grade 12.0 %   Stage 5 HR 134 bpm   Stage 5 SBP 142 mmHg   Stage 5 DBP 81 mmHg   Phase 6 Name Exercise    Stage 6 Name STAGE 3    Stage 6 Attribute Peak    Stage 6 Time 00:01:01    Stage 6 Speed 3.4 mph   Stage 6 Grade 14.0 %   Stage 6 HR 155 bpm   Phase 7 Name Recovery    Stage 7 Attribute Recovery 58mn    Stage 7 Time 00:01:00    Stage 7 Speed 0.0 mph   Stage 7 Grade 0.0 %   Stage  7 HR 120 bpm   Stage 7 SBP 134 mmHg   Stage 7 DBP 78 mmHg   Phase 8 Name Recovery    Stage 8 Time 00:05:24    Stage 8 Speed 0.0 mph   Stage 8 Grade 0.0 %   Stage 8 HR 80 bpm   Stage 8 SBP 112 mmHg   Stage 8 DBP 77 mmHg   Percent of predicted max HR 93 %  Herpes simplex virus(hsv) dna by pcr     Status: None   Collection Time: 05/17/16  4:33 PM  Result Value Ref Range   Source [place]    HSV 1 DNA Not Detected Not Detected   HSV 2 DNA Not Detected Not Detected    Comment: This test was developed and its analytical performance characteristics have been determined by [place], [place]. It has not been cleared or approved by the U.S. Food and Drug Administration. This assay has been validated pursuant to the CLIA regulations and is used for clinical purposes.   CBC with Differential/Platelet     Status: Abnormal   Collection Time: 06/08/16 10:45 AM  Result Value Ref Range   WBC 2.7 (L) 3.8 - 10.8 K/uL   RBC 3.79 (L) 3.80 - 5.10 MIL/uL   Hemoglobin 12.1 11.7 - 15.5 g/dL   HCT 37.8 35.0 - 45.0 %   MCV 99.7 80.0 - 100.0 fL   MCH 31.9 27.0 - 33.0 pg   MCHC 32.0 32.0 - 36.0 g/dL   RDW 13.7 11.0 - 15.0 %   Platelets 214 140 - 400 K/uL    MPV 10.9 7.5 - 12.5 fL   Neutro Abs 1,134 (L) 1,500 - 7,800 cells/uL   Lymphs Abs 1,296 850 - 3,900 cells/uL   Monocytes Absolute 216 200 - 950 cells/uL   Eosinophils Absolute 54 15 - 500 cells/uL   Basophils Absolute 0 0 - 200 cells/uL   Neutrophils Relative % 42 %   Lymphocytes Relative 48 %   Monocytes Relative 8 %   Eosinophils Relative 2 %   Basophils Relative 0 %   Smear Review Criteria for review not met   Sedimentation rate     Status: None   Collection Time: 07/09/16  3:29 PM  Result Value Ref Range   Sedimentation Rate-Westergren 6 0 - 40 mm/hr  CBC with Differential/Platelet     Status: Abnormal   Collection Time: 07/09/16  3:29 PM  Result Value Ref Range   WBC 4.1 3.9 - 10.3 10e3/uL   NEUT# 1.8 1.5 - 6.5 10e3/uL   HGB 12.6 11.6 - 15.9 g/dL   HCT 38.2 34.8 - 46.6 %   Platelets 176 145 - 400 10e3/uL   MCV 96.9 79.5 - 101.0 fL   MCH 31.9 25.1 - 34.0 pg   MCHC 32.9 31.5 - 36.0 g/dL   RBC 3.95 3.70 - 5.45 10e6/uL   RDW 13.5 11.2 - 14.5 %   lymph# 1.6 0.9 - 3.3 10e3/uL   MONO# 0.3 0.1 - 0.9 10e3/uL   Eosinophils Absolute 0.4 0.0 - 0.5 10e3/uL   Basophils Absolute 0.0 0.0 - 0.1 10e3/uL   NEUT% 42.6 38.4 - 76.8 %   LYMPH% 38.5 14.0 - 49.7 %   MONO% 7.2 0.0 - 14.0 %   EOS% 10.8 (H) 0.0 - 7.0 %   BASO% 0.9 0.0 - 2.0 %    RADIOGRAPHIC STUDIES: I have personally reviewed the radiological images as listed and agreed with the findings in  the report. Mr Pelvis W Wo Contrast  Result Date: 06/11/2016 CLINICAL DATA:  Nonhealing buttock wound. Rectal pain. Evaluate for perirectal abscess. EXAM: MRI PELVIS WITHOUT AND WITH CONTRAST TECHNIQUE: Multiplanar multisequence MR imaging of the pelvis was performed both before and after administration of intravenous contrast. CONTRAST:  60m MULTIHANCE GADOBENATE DIMEGLUMINE 529 MG/ML IV SOLN COMPARISON:  None. FINDINGS: Urinary Tract: Unremarkable urinary bladder and urethra. Bowel: No evidence of active perianal fistula or abscess. Low  T2 signal linear structure is seen in the left anterior ischioanal fossa which shows no fluid or contrast enhancement, consistent with scarring from an old perianal fistula. Anus, rectum, and other visualized pelvic bowel loops are unremarkable. Vascular/Lymphatic: No pathologically enlarged lymph nodes or other significant abnormality. Reproductive: Previous hysterectomy. A 1.2 cm cyst is seen along the right posterolateral wall vagina within the labia majora, consistent with a small Bartholin's gland cyst. Other: None. Musculoskeletal: No significant abnormality identified. Artifact noted from hip prostheses. 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 No evidence of active perianal fistula or abscess. 1.2 cm right vaginal Bartholin's gland cyst incidentally noted. Prior hysterectomy. Electronically Signed   By: JEarle GellM.D.   On: 06/11/2016 16:13    ASSESSMENT & PLAN  Chronic leukopenia The patient has chronic, intermittent leukopenia at least since 2010. I suspect this could be due to her African-American heritage. It is not clear to me that the leukopenia is the cause of her recurrent infections. The timing of her various different types of infection does not coincide with cyclical nature of the leukopenia. For this reason, there is no indication for G-CSF support or chronic antibiotic therapy I will call the patient once test results are available  Lap gastric bypass May 2016 She had history of gastric bypass surgery She is already on vitamin D and vitamin B12 supplementation. Rarely, copper deficiency can cause chronic leukopenia. 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 The patient did had history of Clostridium difficile in 2012. I wonder that could be related to the causes of her perianal fistula. She has appointment to see general surgery for further evaluation and would defer to them for further  management   Orders Placed This Encounter  Procedures  . Sedimentation rate    Standing Status:   Future    Number of Occurrences:   1    Standing Expiration Date:   08/13/2017  . Copper, serum    Standing Status:   Future    Number of Occurrences:   1    Standing Expiration Date:   08/13/2017  . CBC with Differential/Platelet    Standing Status:   Future    Number of Occurrences:   1    Standing Expiration Date:   08/13/2017    All questions were answered. The patient knows to call the clinic with any problems, questions or concerns. I spent 40 minutes counseling the patient face to face. The total time spent in the appointment was 55 minutes and more than 50% was on counseling.     NHeath Lark MD 07/10/2016 7:11 AM

## 2016-07-10 NOTE — Assessment & Plan Note (Addendum)
The patient has chronic, intermittent leukopenia at least since 2010. I suspect this could be due to her African-American heritage. It is not clear to me that the leukopenia is the cause of her recurrent infections. The timing of her various different types of infection does not coincide with cyclical nature of the leukopenia. For this reason, there is no indication for G-CSF support or chronic antibiotic therapy I will call the patient once test results are available

## 2016-07-10 NOTE — Assessment & Plan Note (Signed)
She had history of gastric bypass surgery She is already on vitamin D and vitamin B12 supplementation. Rarely, copper deficiency can cause chronic leukopenia. I would check copper level

## 2016-07-11 ENCOUNTER — Telehealth: Payer: Self-pay | Admitting: Hematology and Oncology

## 2016-07-11 LAB — COPPER, SERUM: Copper: 148 ug/dL (ref 72–166)

## 2016-07-11 NOTE — Telephone Encounter (Signed)
I did discuss results with the patient over the telephone. Her CBC came back normal.  Copper level was normal. Sedimentation rate is normal. I felt that the intermittent leukopenia is related to her African-American heritage. I do not believe it is a cause of her recent infection She does not need long-term follow-up

## 2016-07-12 DIAGNOSIS — K603 Anal fistula: Secondary | ICD-10-CM | POA: Diagnosis not present

## 2016-07-17 ENCOUNTER — Other Ambulatory Visit: Payer: Self-pay | Admitting: Endocrinology

## 2016-07-17 MED FILL — ACARBOSE 50 MG TAB: 50 | 30 days supply | Qty: 60 | Fill #0

## 2016-07-31 ENCOUNTER — Other Ambulatory Visit: Payer: Self-pay | Admitting: Internal Medicine

## 2016-07-31 MED FILL — CYCLOBENZAPRINE 10 MG TAB: 10 | 30 days supply | Qty: 30 | Fill #2

## 2016-07-31 MED FILL — ALPRAZolam 0.5 MG TABS: 0.5 | 30 days supply | Qty: 30 | Fill #2

## 2016-07-31 MED FILL — VALACYCLOVIR HCL 500 MG TAB: 500 | 90 days supply | Qty: 90 | Fill #0

## 2016-07-31 MED FILL — DIVIGEL 1 MG GEL PACKET: 1 | 30 days supply | Qty: 30 | Fill #2

## 2016-08-02 ENCOUNTER — Ambulatory Visit: Payer: 59 | Admitting: Skilled Nursing Facility1

## 2016-08-06 ENCOUNTER — Encounter: Payer: Self-pay | Admitting: Skilled Nursing Facility1

## 2016-08-06 ENCOUNTER — Encounter: Payer: 59 | Admitting: Skilled Nursing Facility1

## 2016-08-06 ENCOUNTER — Encounter: Payer: 59 | Attending: Internal Medicine | Admitting: Nutrition

## 2016-08-06 DIAGNOSIS — Z713 Dietary counseling and surveillance: Secondary | ICD-10-CM | POA: Diagnosis not present

## 2016-08-06 DIAGNOSIS — E669 Obesity, unspecified: Secondary | ICD-10-CM | POA: Insufficient documentation

## 2016-08-06 NOTE — Progress Notes (Signed)
RYGB Surgery  Medical Nutrition Therapy:  Appt start time:515 End time: 59  Primary concerns today: Post-operative Bariatric Surgery Nutrition Management.  Pt states she had a blood sugar reading of 43 this morning and had 1/2 cup oatmeal with sugar in it checked her sugar 4 hours latter and it was in the 30's. Pt states her blood sugars were in the 30's and 40's all last week. These low blood sugars are asymptomatic. Pt states she has tried everything and still has hypoglycemia, is discouraged and upset this surgery was supposed to keep her from having diabetes and to lose wt and instead she is gaining wt and having hypoglycemia.  Dietitian tried to educate the pt on why things like ice cream would possibly cause reactive hypoglycemia and why not eating the entire day would cause hyperglycemia but the pt was not in a place of listening due to having trouble accepting her hypoglycemia issues. The pt wants to lose wt and control her blood sugars while also only eating protein due to the past of being told protein is the most important food-that idea has transgressed into her being uncomfortable adding vegetables and complex carbohydrates to her diet in a consistent and structured way.  Pt will log what she eats, how much she eats, and what time she eats to compare against her glucose monitor to identify patterns.    Surgery date: 07/06/2014 Surgery type: RYGB Start weight at [facility]: 286 lbs on 05/01/14 (highest weight 291 per patient) Weight today: 212.3.2 lbs Weight change: 3 lbs gain  Weight loss goal: under 200 lbs (ultimately 180 lbs)   TANITA  BODY COMP RESULTS  06/21/14 07/20/14 08/26/14 09/27/14 11/10/14 12/21/14 03/09/15 06/24/15 09/09/15 09/22/15 12/05/15 04/03/16   BMI (kg/m^2) 45.9 42.4 39.8 38.8 35.1 34.1 31.9 31.2 31.2 NA 32.1 33.2   Fat Mass (lbs) 154.5 143.5 126 110 98 92.5 81 81.5 86.4  81.8 88.8   Fat Free Mass (lbs) 134 123.0 124.5 134 122.5 122 119.5 115 110  120.4 120.2   Total Body  Water (lbs) 98 90.0 91 98 89.5 89.5 87.5 84 78.6  86 86.2    Preferred Learning Style:   No preference indicated   Learning Readiness:   Ready  Breakfast (before 6am): peanut butter sandwich on whole wheat (30g carbs, 21g protein)----scrambled eggs and 2 slices of bacon Snack (10:30 am): yogurt or protein shake (can't always get this snack) Lunch (1:30-2pm): protein bar and Greek yogurt Snack (3:30 pm): Protein shake  Dinner (6 pm): BBQ ribs and turnip greens and cornbread OR baked chicken breast  Medications: see list Supplementation: taking liquid form, sometimes forgets Calcium  Using straws: yes, doesn't feel like like it adds gas Drinking while eating: no Hair loss: some Carbonated beverages: no N/V/D/C: some nausea Dumping syndrome: none   Recent physical activity:  ADL's (back injury)  Progress Towards Goal(s):  In progress.    Nutritional Diagnosis:  [place]-3.3 Overweight/obesity related to past poor dietary habits and physical inactivity as evidenced by patient w/ recent RYGB surgery following dietary guidelines for continued weight loss.     Intervention:  Nutrition counseling provided. Dietitian educated the pt on reactive hypoglycemia.  Goals: -Try the baritastic app  -The capsules of the recommended companies is fine -Viactive/Tums/wellesse are fine for calcium  -Log what you have eaten, how much you have eaten, how you cooked it, and what time you have eaten -Bring the monitor information to your appointment  Teaching Method Utilized:  Visual Auditory  Hands on  Barriers to learning/adherence to lifestyle change: taste changes  Demonstrated degree of understanding via:  Teach Back   Monitoring/Evaluation:  Dietary intake, exercise, and body weight. Follow up in 3 months.

## 2016-08-06 NOTE — Patient Instructions (Addendum)
-  Try the baritastic app   -The capsules of the recommended companies is fine  -Viactive/Tums/wellesse are fine for calcium   -Log what you have eaten, how much you have eaten, how you cooked it, and what time you have eaten  -Bring the monitor information to your appointment

## 2016-08-08 NOTE — Progress Notes (Signed)
Pt. Reports many low blood sugars--45s-60 at different times during the day. Stressed the need to reduce the amount of carbs at each meal and to make sure that each meal has protein and some fat and fiber to help slow the blood sugar rise down. Pt. Very upset with weight gain after surgery, and wants the low blood sugars to stop.  Feels that this is driving her to eat, because she is feeling weak and shaky.   She says that she tests with both the drug store brand meter and the Freestyle Freedom Lite meter.  She says they vary by more that 30-40 points.  A control solution test was done on the Lite meter, and it was within range.  Suggested she stick with just one meter.   A Libre pro sensor was inserted into her upper outer left arm without problems.  We discussed the importance of keeping a food record as well as times when she is feeling low.  We discussed how to fill out the rrec

## 2016-08-09 DIAGNOSIS — S31819S Unspecified open wound of right buttock, sequela: Secondary | ICD-10-CM | POA: Diagnosis not present

## 2016-08-13 ENCOUNTER — Telehealth: Payer: Self-pay | Admitting: Endocrinology

## 2016-08-13 NOTE — Telephone Encounter (Signed)
Called patient and let her know that Vaughan Basta has already gone for the day and she stated that since they were trying the sensor for 2 weeks to see how low her blood sugars were dropping that I will give the message to [facility] in the morning and see where to go from there.

## 2016-08-13 NOTE — Telephone Encounter (Signed)
patient called to advise that the Victoria [name] that was installed fell off on Saturday. She is requesting instruction on how to proceed. Verified mobile #

## 2016-08-14 NOTE — Telephone Encounter (Signed)
I gave a hand written message to Vaughan Basta this morning and she is going to call patient and have her come in so she can restart the Tensed.

## 2016-08-15 NOTE — Telephone Encounter (Signed)
Patient has only had the [place] on for 5 days and is concerned she has not used it long enough. It has fallen off and she can't get it to stay on.  Pt needs to know what's been going on. Latest BS reading 49 at 11:33am.  Thank you,  -LL

## 2016-08-15 NOTE — Telephone Encounter (Signed)
Pt. Reports that she had worn the sensor for 6 days.  Told her to bring it in to have it downloaded.  Can not put another sensor on, because it has been worn more than 5 days.  She agreed to drop it off

## 2016-08-15 NOTE — Telephone Encounter (Signed)
I gave this message to Vaughan Basta so she can contact patient to have her come in so she can put on another sensor.

## 2016-08-19 NOTE — Patient Instructions (Signed)
Keep journal of food intake, and when you feel weak and shakey. Return in 2 weeks for CGM removal.

## 2016-08-20 ENCOUNTER — Other Ambulatory Visit: Payer: Self-pay | Admitting: General Surgery

## 2016-08-20 DIAGNOSIS — L723 Sebaceous cyst: Secondary | ICD-10-CM | POA: Diagnosis not present

## 2016-08-20 MED FILL — ACARBOSE 50 MG TAB: 50 | 30 days supply | Qty: 60 | Fill #1

## 2016-08-20 NOTE — H&P (Unsigned)
History of Present Illness Victoria Ruff MD; 3/53/6144 3:59 PM) The patient is a 56 year old female who presents with a complaint of anal problems. 56 year old female who presents to the office for evaluation of a perianal wound. She states that all last year she had almost monthly recurrences of right-sided perianal wound with drainage. She is only had one recurrence this year in April. 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 Over the past couple days she has developed increasing pain and pressure and is here for repeat evaluation.    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 Gallbladder Surgery - Open Hip Surgery Bilateral. Hysterectomy (not due to cancer) - Partial Oral Surgery Thyroid Surgery  Diagnostic Studies History Victoria Ruff, MD; 9/32/6712 4:01 PM) Colonoscopy 5-10 years ago Mammogram 1-3 years ago Pap Smear 1-5 years ago  Allergies (Victoria [name], [place]; 08/20/2016 3:46 PM) Estradiol *ESTROGENS* patches only- rash Allergies Reconciled  Medication History (Victoria [name], [place]; 08/20/2016 3:46 PM) Multiple Vitamin (Oral) Active. Calcium + D (150-261-330MG -MG-IU Capsule, Oral) Active. Acarbose (25MG  Tablet, Oral HALF A TAB) Active. Cyanocobalamin (1000MCG/ML Solution, Injection) Active. Cholecalciferol (2000UNIT Tablet Chewable, Oral) Active. Phenergan (25MG  Tablet, Oral) Active. Hydrocodone-Acetaminophen (10-325MG  Tablet, Oral) Active. Cyclobenzaprine HCl (10MG  Tablet, Oral) Active. Estradiol (1MG  Tablet, Oral) Active. Aspirin Childrens  (81MG  Tablet Chewable, Oral) Active. Protonix (40MG  Packet, Oral) Active. Levothyroxine Sodium (150MCG Tablet, Oral) Active. ValACYclovir HCl (500MG  Tablet, Oral) Active. Divigel (1MG /GM Gel, Transdermal) Active. Medications Reconciled  Social History Victoria Ruff, MD; 4/58/0998 4:01 PM) Alcohol use Remotely quit alcohol use. Caffeine use Carbonated beverages. No drug use Tobacco use Never smoker.  Family History Victoria Ruff, MD; 3/38/2505 4:01 PM) Cerebrovascular Accident Father. Diabetes Mellitus Family Members In General, Father, Sister. Heart Disease Father. Hypertension Father, Mother, Sister. Seizure disorder Brother. Thyroid problems Mother.  Pregnancy / Birth History Victoria Ruff, MD; 3/97/6734 4:01 PM) Age at menarche 89 years. 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 Not Present- Appetite Loss, Chills, Fever and Weight Loss. Skin Not Present- Change in Wart/Mole, Dryness, Hives, Jaundice, New Lesions, Non-Healing Wounds, Rash and Ulcer. HEENT Present- Wears glasses/contact lenses. Not Present- Earache, Hearing Loss, Hoarseness, Nose Bleed, Oral Ulcers, Ringing in the Ears, Seasonal Allergies, Sinus Pain, Sore Throat, Visual Disturbances and Yellow Eyes. Respiratory Present- Snoring. Not Present- Bloody sputum, Chronic Cough, Difficulty Breathing and Wheezing. Cardiovascular Not Present- Chest Pain, Difficulty Breathing Lying Down, Leg Cramps, Palpitations, Rapid Heart Rate, Shortness of Breath and Swelling of Extremities. Gastrointestinal Present- Nausea. Not Present- Abdominal Pain, Bloating, Bloody Stool,  Change in Bowel Habits, Chronic diarrhea, Constipation, Difficulty Swallowing, Excessive gas, Gets full quickly at meals, Hemorrhoids, Indigestion, Rectal Pain and Vomiting. Female Genitourinary Not Present- Frequency, Nocturia, Painful Urination, Pelvic Pain and Urgency. Musculoskeletal Present- Back Pain, Joint Pain and Joint Stiffness. Not Present- Muscle Pain, Muscle Weakness and Swelling of Extremities. Neurological Present-  Headaches. Not Present- Decreased Memory, Fainting, Numbness, Seizures, Tingling, Tremor, Trouble walking and Weakness. Psychiatric Not Present- Anxiety, Bipolar, Change in Sleep Pattern, Depression, Fearful and Frequent crying. Endocrine Present- Hot flashes. Not Present- Cold Intolerance, Excessive Hunger, Hair Changes, Heat Intolerance and New Diabetes. Hematology Present- Gland problems. Not Present- Easy Bruising, Excessive bleeding, HIV and Persistent Infections.  Vitals (Victoria [name] RMA; 08/20/2016 3:45 PM) 08/20/2016 3:45 PM Weight: 214.6 lb Height: 66in Body Surface Area: 2.06 m Body Mass Index: 34.64 kg/m  Temp.: 98.46F  Pulse: 90 (Regular)  P.OX: 99% (Room air) BP: 106/68 (Sitting, Left Arm, Standard)      Physical Exam Victoria Ruff MD; 10/22/784 4:01 PM)  General Mental Status-Alert. General Appearance-Cooperative, Not in acute distress. Build & Nutrition-Well nourished. Posture-Normal posture. Gait-Normal.  Head and Neck Head-normocephalic, atraumatic with no lesions or palpable masses. Trachea-midline.  Chest and Lung Exam Chest and lung exam reveals -on auscultation, normal breath sounds, no adventitious sounds and normal vocal resonance.  Cardiovascular Cardiovascular examination reveals -normal heart sounds, regular rate and rhythm with no murmurs and no digital clubbing, cyanosis, edema, increased warmth or tenderness.  Abdomen Inspection Inspection of the abdomen reveals - No  Hernias. Palpation/Percussion Palpation and Percussion of the abdomen reveal - Soft, Non Tender, No Rigidity (guarding), No hepatosplenomegaly and No Palpable abdominal masses.  Female Genitourinary Note: right posterior site that appears healed over today. no palpable cord  Rectal Note: Palpable 1.5 cm mass in the posterior right buttock, near midline, just above the level of the coccyx  Neurologic Neurologic evaluation reveals -alert and oriented x 3 with no impairment of recent or remote memory, normal attention span and ability to concentrate, normal sensation and normal coordination.  Musculoskeletal Normal Exam - Bilateral-Upper Extremity Strength Normal and Lower Extremity Strength Normal.    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 She states that it flares up almost every month. It is started to swell over the past few days and she comes back to the office for evaluation. On exam she has a nodule noted in her right posterior buttock just above the level of the coccyx which is tender to palpation. There is no fluctuance noted. There is no palpable cord. It does not appear to be hidradenitis. I think this is most likely a sebaceous cyst that is chronically inflamed. I have recommended excision.

## 2016-08-21 ENCOUNTER — Encounter: Payer: 59 | Admitting: Skilled Nursing Facility1

## 2016-08-21 ENCOUNTER — Encounter: Payer: 59 | Admitting: Nutrition

## 2016-08-21 DIAGNOSIS — E669 Obesity, unspecified: Secondary | ICD-10-CM | POA: Diagnosis not present

## 2016-08-21 DIAGNOSIS — Z713 Dietary counseling and surveillance: Secondary | ICD-10-CM | POA: Diagnosis not present

## 2016-08-21 NOTE — Progress Notes (Signed)
   RYGB Surgery  Medical Nutrition Therapy:  Appt start time:515 End time: 67  Primary concerns today: Post-operative Bariatric Surgery Nutrition Management.  Pt states her monitor fell off after a week, the readings were 24% of the time below 70 glucose, lows happening  From 11pm to 3am, in bed 7:30-8pm up at 4:30 am. Pt states she deffinetly gets lows with sugary cereal, klondike bar. Pt arrived with her food log but it was not complete and there were only a few blood glucoses captured.    Surgery date: 07/06/2014 Surgery type: RYGB [facility] weight at [facility]: 286 lbs on 05/01/14 (highest weight 291 per patient) Weight today: 212.3.2 lbs Weight change: 3 lbs gain  Weight loss goal: under 200 lbs (ultimately 180 lbs)   TANITA  BODY COMP RESULTS  06/21/14 07/20/14 08/26/14 09/27/14 11/10/14 12/21/14 03/09/15 06/24/15 09/09/15 09/22/15 12/05/15 04/03/16   BMI (kg/m^2) 45.9 42.4 39.8 38.8 35.1 34.1 31.9 31.2 31.2 NA 32.1 33.2   Fat Mass (lbs) 154.5 143.5 126 110 98 92.5 81 81.5 86.4  81.8 88.8   Fat Free Mass (lbs) 134 123.0 124.5 134 122.5 122 119.5 115 110  120.4 120.2   Total Body Water (lbs) 98 90.0 91 98 89.5 89.5 87.5 84 78.6  86 86.2    Preferred Learning Style:   No preference indicated   Learning Readiness:   Ready  Breakfast (before 6am): peanut butter sandwich on whole wheat (30g carbs, 21g protein)----scrambled eggs and 2 slices of bacon Snack (10:30 am): yogurt or protein shake (can't always get this snack) Lunch (1:30-2pm): protein bar and Greek yogurt Snack (3:30 pm): Protein shake  Dinner (6 pm): BBQ ribs and turnip greens and cornbread OR baked chicken breast  Medications: see list Supplementation: taking liquid form, sometimes forgets Calcium  Using straws: yes, doesn't feel like like it adds gas Drinking while eating: no Hair loss: some Carbonated beverages: no N/V/D/C: some nausea Dumping syndrome: none   Recent physical activity:  ADL's (back injury)  Progress  Towards Goal(s):  In progress.    Nutritional Diagnosis:  -3.3 Overweight/obesity related to past poor dietary habits and physical inactivity as evidenced by patient w/ recent RYGB surgery following dietary guidelines for continued weight loss.     Intervention:  Nutrition counseling provided. Dietitian educated the pt on reactive hypoglycemia.  Goals: Get a note from your doctor so you can eat every 3 hours at work Try a 1:1 ratio of carbohydrate to protein   Teaching Method Utilized:  Visual Auditory Hands on  Barriers to learning/adherence to lifestyle change: taste changes  Demonstrated degree of understanding via:  Teach Back   Monitoring/Evaluation:  Dietary intake, exercise, and body weight. Follow up in 3 months.

## 2016-09-03 ENCOUNTER — Encounter: Payer: Self-pay | Admitting: Endocrinology

## 2016-09-03 ENCOUNTER — Ambulatory Visit (INDEPENDENT_AMBULATORY_CARE_PROVIDER_SITE_OTHER): Payer: 59 | Admitting: Endocrinology

## 2016-09-03 ENCOUNTER — Telehealth: Payer: Self-pay | Admitting: Internal Medicine

## 2016-09-03 VITALS — BP 116/76 | HR 74 | Ht 66.5 in | Wt 212.0 lb

## 2016-09-03 DIAGNOSIS — H40013 Open angle with borderline findings, low risk, bilateral: Secondary | ICD-10-CM | POA: Diagnosis not present

## 2016-09-03 DIAGNOSIS — H25013 Cortical age-related cataract, bilateral: Secondary | ICD-10-CM | POA: Diagnosis not present

## 2016-09-03 DIAGNOSIS — K911 Postgastric surgery syndromes: Secondary | ICD-10-CM | POA: Diagnosis not present

## 2016-09-03 DIAGNOSIS — E890 Postprocedural hypothyroidism: Secondary | ICD-10-CM

## 2016-09-03 DIAGNOSIS — H35363 Drusen (degenerative) of macula, bilateral: Secondary | ICD-10-CM | POA: Diagnosis not present

## 2016-09-03 DIAGNOSIS — E161 Other hypoglycemia: Secondary | ICD-10-CM | POA: Diagnosis not present

## 2016-09-03 DIAGNOSIS — H40053 Ocular hypertension, bilateral: Secondary | ICD-10-CM | POA: Diagnosis not present

## 2016-09-03 DIAGNOSIS — H2513 Age-related nuclear cataract, bilateral: Secondary | ICD-10-CM | POA: Diagnosis not present

## 2016-09-03 DIAGNOSIS — H524 Presbyopia: Secondary | ICD-10-CM | POA: Diagnosis not present

## 2016-09-03 LAB — T4, FREE: Free T4: 0.83 ng/dL (ref 0.60–1.60)

## 2016-09-03 LAB — TSH: TSH: 1.4 u[IU]/mL (ref 0.35–4.50)

## 2016-09-03 LAB — GLUCOSE, RANDOM: Glucose, Bld: 75 mg/dL (ref 70–99)

## 2016-09-03 NOTE — Progress Notes (Signed)
Patient ID: Victoria [name], female   DOB: 09/27/1960, 56 y.o.   MRN: [id number]            Chief complaint: Low blood sugar episodes  History of Present Illness:  Background history:  She does not have a previous history of diabetes Around May 2017 the patient started having symptoms of episodic sweating and shakiness along with the sensation of nearly passing out. She initially went to her gynecologist thinking it was hot flashes. Subsequently patient had more significant symptoms   She tends to have low blood sugars within 1-2 hours of her meals but particularly after breakfast and lunch and usually not after supper  Has not had any overnight low blood sugars except once after midnight Fasting blood sugars: Usually low normal, recent range 66-88 Postprandial readings prior to her first visit: 46-77 after breakfast and 34-97 after lunch from her Accu-Chek monitor download  RECENT history:  Current management, blood sugar review and problems identified:   She has recently checked blood sugar routinely fairly consistently and not using FreeStyle monitor which was approved by her insurance, previously using [employer] monitor  Also blood sugar data from her freestyle Libre sensor done last month was reviewed today  She is checking blood sugars at work or sometimes on weekends and mostly between breakfast and lunch and sometimes in the afternoon  Her blood sugars are ranging from 39-75 late morning and from 48-90 in the afternoons and appears that 70% of her blood sugars are below 70  Not clear if she is taking any fasting readings at home and none after supper; only once had a reading of 78 at bedtime  Despite HYPOGLYCEMIC blood sugars she says that she does not feel any symptoms of shakiness and sweating that she is to have an only may feel a little tired  DIET: She has been inconsistent with her diet and not restricting carbohydrates as before with eating oatmeal and bacon in the  morning  However she is finding that her freestyle meter is reading about 20 mg lower than the [employer] relion meter  Surprisingly on the freestyle Libre sensor last month her blood sugars were low mostly after supper and some overnight and not during the day in contrast to recent fingerstick findings  She continues to take [place] before each meal 50 mg  Blood sugar patterns from freestyle Libre sensor with records available between 08/06/16 through 08/11/16:   Average glucose 85 with 24% of readings below 70; the daily average blood sugar ranges from 82-88 on those 6 days  Had minimal variability with standard deviation 24  Low blood sugars occurring between 20-28 percent of the total blood sugars  Blood sugars below 70 are documented most significantly after dinner on 6/15 when she had a taco and ice cream with chips for dinner  Overnight blood sugars with the lowest on the night of 6/14-15 preceded by a dinner with stop those, chips and watermelon with a protein bar  Overall lowest blood sugars were overall through the night with mild increases in blood sugars between 5-6 AM, around 12 noon and late evening  HIGHEST hourly blood sugar was 111 between 4-6 PM  Mean values apply above for all meters except median for One Touch  PRE-MEAL Fasting Lunch Dinner Bedtime Overall  Glucose range:  69-1 03  58-85   40-89  87    Mean/median: 79     81    Treatment of hypoglycemia: Previously treating low sugars with  eating a peppermint candy or having grapes or glucose tablets She has had relief of symptoms fairly quickly  DIET: Her breakfast is Recently oatmeal and bacon, previously was having sausage biscuit, sometimes [place] yogurt, protein bar or protein shake At lunch may sometimes have a sandwich but otherwise will have a protein shake She may have a protein shake or bar for snacks also She said that she is not always able to eat on time because of her work schedule  Weight history:  Prior to her gastric bypass surgery her weight was 291  Wt Readings from Last 3 Encounters:  09/03/16 212 lb (96.2 kg)  08/06/16 212 lb 3.2 oz (96.3 kg)  07/09/16 212 lb 12.8 oz (96.5 kg)      Past Medical History:  Diagnosis Date  . C. difficile diarrhea 10/2010   Infection occurred after father had infection  . Complication of anesthesia    HAD DIFFICULTY SWOLLING SOLID FOOD AFTER INTUBATION FOR PAST SURG  . Degenerative joint disease of low back 09/02/15    Dr. Maureen Ralphs  . GERD (gastroesophageal reflux disease)   . Headache    HX MIGRAINES  . Hemorrhoids   . Hiatal hernia   . History of transfusion   . HSV-2 infection   . Hx of adenomatous colonic polyps 10/17/07  . Hydradenitis   . Hypothyroidism   . Obesity   . Osteoarthritis    left hip  . Pernicious anemia   . PONV (postoperative nausea and vomiting)   . Varicose veins   . Vitamin D deficiency     Past Surgical History:  Procedure Laterality Date  . ABDOMINAL HYSTERECTOMY  2004   ovaries remain  . BREATH TEK H PYLORI N/A 05/03/2014   Procedure: BREATH TEK H PYLORI;  Surgeon: Kaylyn Lim, MD;  Location: [facility];  Service: General;  Laterality: N/A;  . [place]  . CHOLECYSTECTOMY  1987  . GASTRIC ROUX-EN-Y N/A 07/06/2014   Procedure: LAPAROSCOPIC ROUX-EN-Y GASTRIC BYPASS, ENTEROLYSIS OF ADHESIONS WITH UPPER ENDOSCOPY;  Surgeon: Johnathan Hausen, MD;  Location: [facility];  Service: General;  Laterality: N/A;  . HAMMER TOE SURGERY    . HIATAL HERNIA REPAIR N/A 07/06/2014   Procedure: LAPAROSCOPIC REPAIR OF HIATAL HERNIA;  Surgeon: Johnathan Hausen, MD;  Location: [facility];  Service: General;  Laterality: N/A;  . HIP ARTHROPLASTY  01/31/09   left  . hsv 2    . JOINT REPLACEMENT  01/2009   l hip (BILATERAL TOTAL HIPS)  . REFRACTIVE SURGERY    . THYROIDECTOMY  1980  . TOTAL HIP ARTHROPLASTY Right 05/21/2012   Procedure: TOTAL HIP ARTHROPLASTY;  Surgeon: Gearlean Alf, MD;  Location: [facility];  Service:  Orthopedics;  Laterality: Right;    Family History  Problem Relation Age of Onset  . Diabetes Father   . Diabetes Sister   . Hypertension Sister   . Hypertension Mother     Social History:  reports that she has never smoked. She has never used smokeless tobacco. She reports that she does not drink alcohol or use drugs.  Allergies:  Allergies  Allergen Reactions  . Estradiol Rash    Local rash with estradiol patches    Allergies as of 09/03/2016      Reactions   Estradiol Rash   Local rash with estradiol patches      Medication List       Accurate as of 09/03/16 11:59 PM. Always use your most recent med list.  acarbose 50 MG tablet Commonly known as:  PRECOSE TAKE 1 TABLET BY MOUTH BEFORE BREAKFAST AND 1 TABLET BEFORE LUNCH.   ACCU-CHEK SOFTCLIX LANCETS lancets   freestyle lancets Use to check blood sugar 2 times per day.   ALPRAZolam 0.5 MG tablet Commonly known as:  XANAX TAKE 1 TABLET BY MOUTH AT BEDTIME   CALCIUM-VITAMIN D3 PO Take 15 mLs by mouth.   cyanocobalamin 1000 MCG/ML injection Commonly known as:  (VITAMIN B-12) INJECT 1 ML INTRAMUSCULARLY EVERY MONTH   cyclobenzaprine 10 MG tablet Commonly known as:  FLEXERIL TAKE 1 TABLET BY MOUTH AT BEDTIME   Estradiol 1 MG/GM Gel Apply 1 mg topically daily.   FREESTYLE LITE Devi Use to check blood sugar 2 times per day.   glucose blood test strip Commonly known as:  FREESTYLE LITE Use to check blood sugar 2 times per day.   HYDROcodone-acetaminophen 10-325 MG tablet Commonly known as:  NORCO Take 1 tablet by mouth every 8 (eight) hours as needed (pain).   levothyroxine 100 MCG tablet Commonly known as:  SYNTHROID, LEVOTHROID TAKE 1 TABLET BY MOUTH ONCE DAILY   MULTIVITAMIN PO Take 30 mLs by mouth.   pantoprazole 40 MG tablet Commonly known as:  PROTONIX TAKE 1 TABLET BY MOUTH TWICE DAILY   promethazine 25 MG tablet Commonly known as:  PHENERGAN TAKE 1 TABLET BY MOUTH EVERY 8  HOURS AS NEEDED FOR NAUSEA AND VOMITING   valACYclovir 500 MG tablet Commonly known as:  VALTREX TAKE 1 TABLET BY MOUTH ONCE DAILY        Review of Systems  Endocrine: She has had post ablative hypothyroidism, previously had Graves' disease, followed by PCP Her dose has been stable No   fatigue  Lab Results  Component Value Date   TSH 1.40 09/03/2016   TSH 2.58 03/12/2016   TSH 2.70 09/09/2015   FREET4 0.83 09/03/2016   FREET4 1.0 09/16/2015   FREET4 0.7 (L) 05/26/2015     PHYSICAL EXAM:  BP 116/76   Pulse 74   Ht 5' 6.5" (1.689 m)   Wt 212 lb (96.2 kg)   SpO2 96%   BMI 33.71 kg/m    ASSESSMENT:   Post gastrectomy dumping syndrome with reactive hypoglycemia.    Her studies and blood sugar records are somewhat confusing because of lack of documented hypoglycemia on the continuous glucose sensor whereas she is having fingerstick blood sugars that are tending to be relatively low around 10-11 AM frequently and some in the afternoon This is despite her generally trying to have some protein in the morning hours; however now she is eating oatmeal at breakfast since she does not get satisfied with other meals However she is having variable intake and sometimes more carbohydrate especially at dinner last month Surprisingly she had no symptoms with the low sugars documented although she thinks that her [employer] meter was reading 20 mg higher than the freestyle. No documented blood sugars on fingersticks after dinner or fasting  Patient is still relatively obese and probably insulin resistant because of her family history of diabetes She does not exercise  HYPOTHYROIDISM: She will need follow-up TSH  PLAN:    she will have a lab glucose and this will be compared to her FreeStyle reading today She will have blood glucose drawn when she has a low blood sugar documented on her FreeStyle meter and have concomitant insulin and C-peptide level Difficult to treat her because  she has no symptoms and not clear if she  is truly is getting hypoglycemia now   Options for treatment will be based on results May consider CT scan of pancreas especially if she has significantly high C-peptide level with a low blood sugar She does need to start exercising  She needs to modify her meals and add more protein especially at breakfast and less carbohydrate, does not have to have a minimum of 45 g carbohydrate at meals despite what the dietitian has told her  Total visit time for evaluation and management of her complex problem, discussion with patient, counseling and planning = 25 minutes  Patient Instructions  Start exercise  Check glucose and insulin level at time of low sugar  Avoid oatmeal in am  More sugars after dinner          KUMAR,AJAY 09/04/2016, 6:56 PM   Note: This office note was prepared with Dragon voice recognition system technology. Any transcriptional errors that result from this process are unintentional.

## 2016-09-03 NOTE — Telephone Encounter (Signed)
Please call her. She can skip the appt we made for labs since Dr, Dwyane Dee did this but I still would like to see her,

## 2016-09-03 NOTE — Telephone Encounter (Signed)
Lab visit canceled and explained to the patient that she still needs to come on 7/19 to see Dr. Renold Genta and she stated that she understood

## 2016-09-03 NOTE — Patient Instructions (Addendum)
Start exercise  Check glucose and insulin level at time of low sugar  Avoid oatmeal in am  More sugars after dinner

## 2016-09-03 NOTE — Telephone Encounter (Signed)
Patient stated she is scheduled to come in the office for labs on Monday, but she went to her Endocrinologist today and they did labs.  She wants to know if she needs to come in on Monday to have those labs done or can the doctor use the labs she had today.

## 2016-09-04 ENCOUNTER — Other Ambulatory Visit (INDEPENDENT_AMBULATORY_CARE_PROVIDER_SITE_OTHER): Payer: 59

## 2016-09-04 DIAGNOSIS — K911 Postgastric surgery syndromes: Secondary | ICD-10-CM | POA: Diagnosis not present

## 2016-09-04 DIAGNOSIS — E161 Other hypoglycemia: Secondary | ICD-10-CM

## 2016-09-04 LAB — GLUCOSE, RANDOM: Glucose, Bld: 91 mg/dL (ref 70–99)

## 2016-09-05 ENCOUNTER — Telehealth: Payer: Self-pay

## 2016-09-05 NOTE — Telephone Encounter (Signed)
Pt called and asked to see another Endocrinologist one the is "up to date" on reactive hypoglycemia. She does not feel like Dr. Dwyane Dee is. Please advise

## 2016-09-05 NOTE — Telephone Encounter (Signed)
Spoke with patient regarding her appointments with Dr. Dwyane Dee.  Patient states her frustration with Dr. Dwyane Dee is that she feels that he is not listening to her.  States that she has tried her diet many different ways - good and bad.  She knows how many carbs she can have per what she has been instructed.  She has seen the dietician, etc.  She is willing to wait and have a conversation with you at her appointment on Thursday, 7/19 and discuss options for further treatment.  She is also willing to pay out of network if that is a better treatment plan for her.  She just wants to get the best treatment and have someone hear her feel as though they have dealt with this particular diagnosis before.  She does NOT want to have to have this reversed and she feels that is the direction that they are trying to take her in.  She feels very discouraged at the present.     Patient will continue to work through things until her upcoming appointment with you on 7/19 at 4:00 p.m.

## 2016-09-05 NOTE — Progress Notes (Signed)
Please call to let patient know that the lab glucose was normal, not sure why she had just drawn.  Will not do insulin and C-peptide level unless blood sugars are below 50.  Previous lab glucose was 75 compared to her lab glucose of 78 Please confirm with Kieth Brightly that C-peptide and insulin levels have not been sent out

## 2016-09-06 ENCOUNTER — Other Ambulatory Visit: Payer: Self-pay

## 2016-09-06 NOTE — Telephone Encounter (Signed)
We will need to research this further and get back to her.

## 2016-09-10 ENCOUNTER — Other Ambulatory Visit: Payer: 59 | Admitting: Internal Medicine

## 2016-09-12 ENCOUNTER — Other Ambulatory Visit: Payer: Self-pay | Admitting: Endocrinology

## 2016-09-12 DIAGNOSIS — E161 Other hypoglycemia: Secondary | ICD-10-CM

## 2016-09-13 ENCOUNTER — Encounter: Payer: Self-pay | Admitting: Internal Medicine

## 2016-09-13 ENCOUNTER — Ambulatory Visit (INDEPENDENT_AMBULATORY_CARE_PROVIDER_SITE_OTHER): Payer: 59 | Admitting: Internal Medicine

## 2016-09-13 VITALS — BP 96/58 | HR 71 | Temp 97.5°F | Wt 210.0 lb

## 2016-09-13 DIAGNOSIS — Z9884 Bariatric surgery status: Secondary | ICD-10-CM | POA: Diagnosis not present

## 2016-09-13 DIAGNOSIS — K603 Anal fistula, unspecified: Secondary | ICD-10-CM

## 2016-09-13 DIAGNOSIS — E538 Deficiency of other specified B group vitamins: Secondary | ICD-10-CM

## 2016-09-13 DIAGNOSIS — E161 Other hypoglycemia: Secondary | ICD-10-CM | POA: Diagnosis not present

## 2016-09-13 DIAGNOSIS — E039 Hypothyroidism, unspecified: Secondary | ICD-10-CM

## 2016-09-13 NOTE — Progress Notes (Signed)
   Subjective:    Patient ID: Victoria [name], female    DOB: 1961-01-28, 56 y.o.   MRN: [id number]  HPI 56 year old Black Female in today to follow-up on hypothyroidism. 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 TSH done recently was normal. She does work with the dietitian and has tried various strategies to prevent hypoglycemia. Regardless of how she eats, it seems to continue to occur. She was having episodes of hypoglycemia while sleeping that she was unaware on mental recently. She's never had a seizure due to hypoglycemia. Dr. Dwyane Dee has her on acarbose for hypoglycemia and that seems to have helped some but even today after eating 3 small meals/snacks, her blood sugar dropped to the 50s. She is asymptomatic for the most part with these low blood sugars.  She has a perianal fistula that is to be repaired in the near future by Dr. [name].  She has Xanax on hand for anxiety. This seems to be helping.  History of B-12 deficiency treated with monthly B 12 injections which she gives to herself.   She is status post Roux-en-Y gastric bypass surgery in 2016    Review of Systems     Objective:   Physical Exam Spent about 20 minutes with her today talking about these issues and reviewing options.        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  Vitamin B-12 deficiency  History of perianal fistula  B-12 deficiency  Plan: She will return in 6 months for physical examination. Is to have fistula repaired by Dr. Marcello Moores in the near future.  I will give some consideration to what options are for her with these episodes of hypoglycemia which understand are uncommon with gastric bypass surgery

## 2016-09-13 NOTE — Patient Instructions (Signed)
It was a pleasure to see you today. Continue current medications and return in 6 months.

## 2016-09-18 MED FILL — ALPRAZolam 0.5 MG TABS: 0.5 | 30 days supply | Qty: 30 | Fill #3

## 2016-09-18 MED FILL — ACARBOSE 50 MG TAB: 50 | 30 days supply | Qty: 60 | Fill #2

## 2016-09-19 ENCOUNTER — Other Ambulatory Visit: Payer: Self-pay | Admitting: Obstetrics and Gynecology

## 2016-09-19 MED FILL — CYCLOBENZAPRINE 10 MG TABLE: 10 | 30 days supply | Qty: 30 | Fill #3

## 2016-09-19 NOTE — Telephone Encounter (Signed)
Please inform the patient that a one month supply of her ERT was sent in. She needs an annual exam with Dr Sabra Heck prior to refills. Please set her up for an appointment. Thanks

## 2016-09-19 NOTE — Telephone Encounter (Signed)
Medication refill request: Divigel 1MG   Last AEX:  N/A Last OV: 05-17-16  Next AEX: not scheduled  Last MMG (if hormonal medication request): 04-26-16 WNL  Refill authorized: please advise

## 2016-09-20 MED FILL — DIVIGEL 1 MG GEL PACKET: 1 | 30 days supply | Qty: 30 | Fill #0

## 2016-09-21 MED FILL — FREESTYLE LITE TEST STRIP: 90 days supply | Qty: 200 | Fill #1

## 2016-09-21 NOTE — Telephone Encounter (Signed)
Left message to call Jill at [phone].  

## 2016-09-21 NOTE — Telephone Encounter (Signed)
Spoke with patient, advised as seen below per Dr. Talbert Nan. Patient scheduled for AEX on 10/19/16 at 2:45 pm with Dr. Sabra Heck, patient declined earlier appointments offered. Patient verbalizes understanding and is agreeable.   Patient is agreeable to disposition. Will close encounter.   Cc: Dr. Sabra Heck

## 2016-09-26 MED FILL — LEVOTHYROXINE 100 MCG TAB: 100 | 90 days supply | Qty: 90 | Fill #1

## 2016-09-26 MED FILL — CYANOCOBALAMIN 1,000 MCG/ML: 1000 | 84 days supply | Qty: 3 | Fill #3

## 2016-09-28 ENCOUNTER — Encounter (HOSPITAL_BASED_OUTPATIENT_CLINIC_OR_DEPARTMENT_OTHER): Payer: Self-pay | Admitting: *Deleted

## 2016-09-28 ENCOUNTER — Encounter: Payer: Self-pay | Admitting: Podiatry

## 2016-09-28 ENCOUNTER — Ambulatory Visit (INDEPENDENT_AMBULATORY_CARE_PROVIDER_SITE_OTHER): Payer: 59 | Admitting: Podiatry

## 2016-09-28 DIAGNOSIS — M779 Enthesopathy, unspecified: Secondary | ICD-10-CM | POA: Diagnosis not present

## 2016-09-28 DIAGNOSIS — L84 Corns and callosities: Secondary | ICD-10-CM | POA: Diagnosis not present

## 2016-09-28 MED ORDER — TRIAMCINOLONE ACETONIDE 10 MG/ML IJ SUSP
10.0000 mg | Freq: Once | INTRAMUSCULAR | Status: AC
  Administered 2016-09-28: 10 mg

## 2016-10-01 ENCOUNTER — Encounter (HOSPITAL_BASED_OUTPATIENT_CLINIC_OR_DEPARTMENT_OTHER): Payer: Self-pay | Admitting: *Deleted

## 2016-10-01 NOTE — Progress Notes (Signed)
Subjective:    Patient ID: Victoria [name], female   DOB: 56 y.o.   MRN: [id number]   HPI patient presents with a painful fifth digit right with inflammation fluid buildup around the interphalangeal joint with keratotic lesion formation that's painful    ROS      Objective:  Physical Exam neurovascular status intact with inflammatory changes of the interphalangeal joint right fifth digit with keratotic tissue formation that is painful when pressed     Assessment:    Inflammatory capsulitis of the interphalangeal joint right fifth digit with keratotic lesion     Plan:    H&P condition reviewed proximal block administered and careful injection of the interphalangeal joint administered. Then went ahead and debrided lesion fully applied padding and reappoint and understands may eventually require surgery  X-ray indicated rotation of the toe with no indications of other pathology

## 2016-10-01 NOTE — Progress Notes (Signed)
NPO AFTER MN.  ARRIVE AT 0600.  NEEDS ISTAT.  CURRENT EKG IN CHART AND EPIC.   WILL TAKE PROTONIX AND SYNTHROID AM DOS W/ SIPS OF WATER.

## 2016-10-02 ENCOUNTER — Other Ambulatory Visit: Payer: Self-pay

## 2016-10-03 ENCOUNTER — Encounter: Payer: Self-pay | Admitting: Endocrinology

## 2016-10-08 DIAGNOSIS — K912 Postsurgical malabsorption, not elsewhere classified: Secondary | ICD-10-CM | POA: Diagnosis not present

## 2016-10-08 DIAGNOSIS — R635 Abnormal weight gain: Secondary | ICD-10-CM | POA: Diagnosis not present

## 2016-10-10 NOTE — Anesthesia Preprocedure Evaluation (Addendum)
Anesthesia Evaluation  Patient identified by MRN, date of birth, ID band Patient awake    Reviewed: Allergy & Precautions, NPO status , Patient's Chart, lab work & pertinent test results  History of Anesthesia Complications (+) PONV  Airway Mallampati: II  TM Distance: >3 FB Neck ROM: Full    Dental   Pulmonary neg pulmonary ROS,    breath sounds clear to auscultation       Cardiovascular negative cardio ROS   Rhythm:Regular Rate:Normal     Neuro/Psych negative neurological ROS     GI/Hepatic Neg liver ROS, GERD  ,  Endo/Other  Hypothyroidism   Renal/GU negative Renal ROS     Musculoskeletal  (+) Arthritis ,   Abdominal   Peds  Hematology negative hematology ROS (+)   Anesthesia Other Findings   Reproductive/Obstetrics                            Anesthesia Physical Anesthesia Plan  ASA: II  Anesthesia Plan: MAC   Post-op Pain Management:    Induction: Intravenous  PONV Risk Score and Plan: 3 and Ondansetron, Dexamethasone, Midazolam, Propofol infusion and Treatment may vary due to age or medical condition  Airway Management Planned: Natural Airway and Simple Face Mask  Additional Equipment:   Intra-op Plan:   Post-operative Plan:   Informed Consent: I have reviewed the patients History and Physical, chart, labs and discussed the procedure including the risks, benefits and alternatives for the proposed anesthesia with the patient or authorized representative who has indicated his/her understanding and acceptance.     Plan Discussed with: CRNA  Anesthesia Plan Comments:        Anesthesia Quick Evaluation

## 2016-10-11 ENCOUNTER — Ambulatory Visit (HOSPITAL_BASED_OUTPATIENT_CLINIC_OR_DEPARTMENT_OTHER): Payer: 59 | Admitting: Anesthesiology

## 2016-10-11 ENCOUNTER — Encounter (HOSPITAL_BASED_OUTPATIENT_CLINIC_OR_DEPARTMENT_OTHER): Payer: Self-pay

## 2016-10-11 ENCOUNTER — Ambulatory Visit (HOSPITAL_BASED_OUTPATIENT_CLINIC_OR_DEPARTMENT_OTHER)
Admission: RE | Admit: 2016-10-11 | Discharge: 2016-10-11 | Disposition: A | Discharge status: Home / Self Care | Payer: 59 | Source: Ambulatory Visit | Attending: General Surgery | Admitting: General Surgery

## 2016-10-11 ENCOUNTER — Encounter (HOSPITAL_BASED_OUTPATIENT_CLINIC_OR_DEPARTMENT_OTHER)
Admission: RE | Disposition: A | Discharge status: Home / Self Care | Payer: Self-pay | Source: Ambulatory Visit | Attending: General Surgery

## 2016-10-11 DIAGNOSIS — Z90710 Acquired absence of both cervix and uterus: Secondary | ICD-10-CM | POA: Diagnosis not present

## 2016-10-11 DIAGNOSIS — Z833 Family history of diabetes mellitus: Secondary | ICD-10-CM | POA: Diagnosis not present

## 2016-10-11 DIAGNOSIS — Z823 Family history of stroke: Secondary | ICD-10-CM | POA: Insufficient documentation

## 2016-10-11 DIAGNOSIS — Z8349 Family history of other endocrine, nutritional and metabolic diseases: Secondary | ICD-10-CM | POA: Diagnosis not present

## 2016-10-11 DIAGNOSIS — Z8249 Family history of ischemic heart disease and other diseases of the circulatory system: Secondary | ICD-10-CM | POA: Diagnosis not present

## 2016-10-11 DIAGNOSIS — G43909 Migraine, unspecified, not intractable, without status migrainosus: Secondary | ICD-10-CM | POA: Diagnosis not present

## 2016-10-11 DIAGNOSIS — Z86010 Personal history of colonic polyps: Secondary | ICD-10-CM | POA: Diagnosis not present

## 2016-10-11 DIAGNOSIS — E079 Disorder of thyroid, unspecified: Secondary | ICD-10-CM | POA: Insufficient documentation

## 2016-10-11 DIAGNOSIS — Z79899 Other long term (current) drug therapy: Secondary | ICD-10-CM | POA: Diagnosis not present

## 2016-10-11 DIAGNOSIS — L720 Epidermal cyst: Secondary | ICD-10-CM | POA: Insufficient documentation

## 2016-10-11 DIAGNOSIS — M1990 Unspecified osteoarthritis, unspecified site: Secondary | ICD-10-CM | POA: Insufficient documentation

## 2016-10-11 DIAGNOSIS — L723 Sebaceous cyst: Secondary | ICD-10-CM | POA: Diagnosis not present

## 2016-10-11 DIAGNOSIS — Z888 Allergy status to other drugs, medicaments and biological substances status: Secondary | ICD-10-CM | POA: Diagnosis not present

## 2016-10-11 DIAGNOSIS — Z820 Family history of epilepsy and other diseases of the nervous system: Secondary | ICD-10-CM | POA: Insufficient documentation

## 2016-10-11 DIAGNOSIS — K219 Gastro-esophageal reflux disease without esophagitis: Secondary | ICD-10-CM | POA: Diagnosis not present

## 2016-10-11 DIAGNOSIS — E119 Type 2 diabetes mellitus without complications: Secondary | ICD-10-CM | POA: Diagnosis not present

## 2016-10-11 DIAGNOSIS — Z9884 Bariatric surgery status: Secondary | ICD-10-CM | POA: Diagnosis not present

## 2016-10-11 DIAGNOSIS — Z6834 Body mass index (BMI) 34.0-34.9, adult: Secondary | ICD-10-CM | POA: Diagnosis not present

## 2016-10-11 DIAGNOSIS — Z7982 Long term (current) use of aspirin: Secondary | ICD-10-CM | POA: Diagnosis not present

## 2016-10-11 HISTORY — DX: Deficiency of other specified B group vitamins: E53.8

## 2016-10-11 HISTORY — PX: MASS EXCISION: SHX2000

## 2016-10-11 HISTORY — DX: Postprocedural hypothyroidism: E89.0

## 2016-10-11 HISTORY — DX: Other hypoglycemia: E16.1

## 2016-10-11 HISTORY — DX: Decreased white blood cell count, unspecified: D72.819

## 2016-10-11 HISTORY — DX: Personal history of other infectious and parasitic diseases: Z86.19

## 2016-10-11 HISTORY — DX: Diffuse cystic mastopathy of unspecified breast: N60.19

## 2016-10-11 HISTORY — DX: Postgastric surgery syndromes: K91.1

## 2016-10-11 HISTORY — DX: Personal history of other specified conditions: Z87.898

## 2016-10-11 LAB — POCT I-STAT 4, (NA,K, GLUC, HGB,HCT)
Glucose, Bld: 78 mg/dL (ref 65–99)
HCT: 36 % (ref 36.0–46.0)
Hemoglobin: 12.2 g/dL (ref 12.0–15.0)
Potassium: 4.2 mmol/L (ref 3.5–5.1)
Sodium: 140 mmol/L (ref 135–145)

## 2016-10-11 SURGERY — EXCISION MASS
Anesthesia: Monitor Anesthesia Care | Site: Rectum

## 2016-10-11 MED ORDER — PANTOPRAZOLE SODIUM 40 MG PO TBEC: DELAYED_RELEASE_TABLET | ORAL | Status: DC

## 2016-10-11 MED ORDER — DEXTROSE 5 % IV SOLN
3.0000 g | INTRAVENOUS | Status: AC
  Administered 2016-10-11: 3 g via INTRAVENOUS
  Filled 2016-10-11: qty 3000

## 2016-10-11 MED ORDER — PROPOFOL 500 MG/50ML IV EMUL
INTRAVENOUS | Status: DC | PRN
  Administered 2016-10-11: 200 ug/kg/min via INTRAVENOUS

## 2016-10-11 MED ORDER — ONDANSETRON HCL 4 MG/2ML IJ SOLN
INTRAMUSCULAR | Status: DC | PRN
  Administered 2016-10-11: 4 mg via INTRAVENOUS

## 2016-10-11 MED ORDER — TRAMADOL HCL 50 MG PO TABS: 50.0000 mg | ORAL_TABLET | Freq: Four times a day (QID) | ORAL | 0 refills | Status: DC | PRN

## 2016-10-11 MED ORDER — LACTATED RINGERS IV SOLN
INTRAVENOUS | Status: DC
  Administered 2016-10-11: 07:00:00 via INTRAVENOUS
  Filled 2016-10-11: qty 1000

## 2016-10-11 MED ORDER — OXYCODONE HCL 5 MG PO TABS
5.0000 mg | ORAL_TABLET | ORAL | Status: DC | PRN
  Filled 2016-10-11: qty 2

## 2016-10-11 MED ORDER — ACETAMINOPHEN 325 MG PO TABS
650.0000 mg | ORAL_TABLET | ORAL | Status: DC | PRN
  Filled 2016-10-11: qty 2

## 2016-10-11 MED ORDER — CYCLOBENZAPRINE HCL 10 MG PO TABS: ORAL_TABLET | ORAL | Status: DC

## 2016-10-11 MED ORDER — FENTANYL CITRATE (PF) 100 MCG/2ML IJ SOLN
INTRAMUSCULAR | Status: AC
  Filled 2016-10-11: qty 2

## 2016-10-11 MED ORDER — CEFAZOLIN SODIUM-DEXTROSE 1-4 GM/50ML-% IV SOLN
INTRAVENOUS | Status: AC
  Filled 2016-10-11: qty 50

## 2016-10-11 MED ORDER — DEXAMETHASONE SODIUM PHOSPHATE 10 MG/ML IJ SOLN
INTRAMUSCULAR | Status: AC
  Filled 2016-10-11: qty 1

## 2016-10-11 MED ORDER — SODIUM CHLORIDE 0.9 % IV SOLN
250.0000 mL | INTRAVENOUS | Status: DC | PRN
  Filled 2016-10-11: qty 250

## 2016-10-11 MED ORDER — GABAPENTIN 300 MG PO CAPS
300.0000 mg | ORAL_CAPSULE | ORAL | Status: AC
  Administered 2016-10-11: 300 mg via ORAL
  Filled 2016-10-11: qty 1

## 2016-10-11 MED ORDER — SODIUM CHLORIDE 0.9% FLUSH
3.0000 mL | Freq: Two times a day (BID) | INTRAVENOUS | Status: DC
  Filled 2016-10-11: qty 3

## 2016-10-11 MED ORDER — DEXAMETHASONE SODIUM PHOSPHATE 4 MG/ML IJ SOLN
INTRAMUSCULAR | Status: DC | PRN
  Administered 2016-10-11: 10 mg via INTRAVENOUS

## 2016-10-11 MED ORDER — ACETAMINOPHEN 500 MG PO TABS
1000.0000 mg | ORAL_TABLET | ORAL | Status: AC
  Administered 2016-10-11: 1000 mg via ORAL
  Filled 2016-10-11: qty 2

## 2016-10-11 MED ORDER — HYDROCODONE-ACETAMINOPHEN 7.5-325 MG PO TABS
1.0000 | ORAL_TABLET | Freq: Once | ORAL | Status: DC | PRN
  Filled 2016-10-11: qty 1

## 2016-10-11 MED ORDER — PROPOFOL 500 MG/50ML IV EMUL
INTRAVENOUS | Status: AC
  Filled 2016-10-11: qty 50

## 2016-10-11 MED ORDER — MIDAZOLAM HCL 5 MG/5ML IJ SOLN
INTRAMUSCULAR | Status: DC | PRN
  Administered 2016-10-11: 2 mg via INTRAVENOUS

## 2016-10-11 MED ORDER — ACETAMINOPHEN 650 MG RE SUPP
650.0000 mg | RECTAL | Status: DC | PRN
  Filled 2016-10-11: qty 1

## 2016-10-11 MED ORDER — FENTANYL CITRATE (PF) 100 MCG/2ML IJ SOLN
25.0000 ug | INTRAMUSCULAR | Status: DC | PRN
  Filled 2016-10-11: qty 1

## 2016-10-11 MED ORDER — LIDOCAINE HCL (CARDIAC) 20 MG/ML IV SOLN
INTRAVENOUS | Status: DC | PRN
  Administered 2016-10-11: 60 mg via INTRAVENOUS

## 2016-10-11 MED ORDER — ACETAMINOPHEN 500 MG PO TABS
ORAL_TABLET | ORAL | Status: AC
  Filled 2016-10-11: qty 2

## 2016-10-11 MED ORDER — SODIUM CHLORIDE 0.9% FLUSH
3.0000 mL | INTRAVENOUS | Status: DC | PRN
  Filled 2016-10-11: qty 3

## 2016-10-11 MED ORDER — GABAPENTIN 300 MG PO CAPS
ORAL_CAPSULE | ORAL | Status: AC
  Filled 2016-10-11: qty 1

## 2016-10-11 MED ORDER — VALACYCLOVIR HCL 500 MG PO TABS: ORAL_TABLET | ORAL | Status: DC

## 2016-10-11 MED ORDER — PROMETHAZINE HCL 25 MG/ML IJ SOLN
6.2500 mg | INTRAMUSCULAR | Status: DC | PRN
  Filled 2016-10-11: qty 1

## 2016-10-11 MED ORDER — LIDOCAINE 2% (20 MG/ML) 5 ML SYRINGE
INTRAMUSCULAR | Status: AC
  Filled 2016-10-11: qty 5

## 2016-10-11 MED ORDER — BUPIVACAINE-EPINEPHRINE 0.5% -1:200000 IJ SOLN
INTRAMUSCULAR | Status: DC | PRN
  Administered 2016-10-11: 30 mL

## 2016-10-11 MED ORDER — CEFAZOLIN SODIUM-DEXTROSE 2-4 GM/100ML-% IV SOLN
INTRAVENOUS | Status: AC
  Filled 2016-10-11: qty 100

## 2016-10-11 MED ORDER — FENTANYL CITRATE (PF) 100 MCG/2ML IJ SOLN
INTRAMUSCULAR | Status: DC | PRN
  Administered 2016-10-11: 25 ug via INTRAVENOUS

## 2016-10-11 MED ORDER — ONDANSETRON HCL 4 MG/2ML IJ SOLN
INTRAMUSCULAR | Status: AC
  Filled 2016-10-11: qty 2

## 2016-10-11 MED ORDER — MIDAZOLAM HCL 2 MG/2ML IJ SOLN
INTRAMUSCULAR | Status: AC
  Filled 2016-10-11: qty 2

## 2016-10-11 MED ORDER — LEVOTHYROXINE SODIUM 100 MCG PO TABS: ORAL_TABLET | ORAL | Status: DC

## 2016-10-11 SURGICAL SUPPLY — 58 items
ADH SKN CLS APL DERMABOND .7 (GAUZE/BANDAGES/DRESSINGS) ×1
APL SKNCLS STERI-STRIP NONHPOA (GAUZE/BANDAGES/DRESSINGS) ×2
BENZOIN TINCTURE PRP APPL 2/3 (GAUZE/BANDAGES/DRESSINGS) ×4 IMPLANT
BLADE HEX COATED 2.75 (ELECTRODE) ×2 IMPLANT
BLADE SURG 15 STRL LF DISP TIS (BLADE) IMPLANT
BLADE SURG 15 STRL SS (BLADE) ×2
BNDG GAUZE ELAST 4 BULKY (GAUZE/BANDAGES/DRESSINGS) ×1 IMPLANT
BRIEF STRETCH FOR OB PAD LRG (UNDERPADS AND DIAPERS) ×4 IMPLANT
CANISTER SUCT 3000ML PPV (MISCELLANEOUS) ×2 IMPLANT
COVER BACK TABLE 60X90IN (DRAPES) ×2 IMPLANT
COVER MAYO STAND STRL (DRAPES) ×2 IMPLANT
DECANTER SPIKE VIAL GLASS SM (MISCELLANEOUS) ×1 IMPLANT
DERMABOND ADVANCED (GAUZE/BANDAGES/DRESSINGS) ×1
DERMABOND ADVANCED .7 DNX12 (GAUZE/BANDAGES/DRESSINGS) ×1 IMPLANT
DRAPE LAPAROTOMY 100X72 PEDS (DRAPES) ×2 IMPLANT
DRAPE UTILITY XL STRL (DRAPES) ×2 IMPLANT
ELECT BLADE 6.5 .24CM SHAFT (ELECTRODE) IMPLANT
ELECT REM PT RETURN 9FT ADLT (ELECTROSURGICAL) ×2
ELECTRODE REM PT RTRN 9FT ADLT (ELECTROSURGICAL) ×1 IMPLANT
GAUZE SPONGE 4X4 16PLY XRAY LF (GAUZE/BANDAGES/DRESSINGS) IMPLANT
GLOVE BIO SURGEON STRL SZ 6.5 (GLOVE) ×4 IMPLANT
GLOVE INDICATOR 7.0 STRL GRN (GLOVE) ×4 IMPLANT
GOWN STRL REUS W/ TWL LRG LVL3 (GOWN DISPOSABLE) ×1 IMPLANT
GOWN STRL REUS W/ TWL XL LVL3 (GOWN DISPOSABLE) ×2 IMPLANT
GOWN STRL REUS W/TWL LRG LVL3 (GOWN DISPOSABLE) ×2
GOWN STRL REUS W/TWL XL LVL3 (GOWN DISPOSABLE) ×4
HOOK RETRACTION 12 ELAST STAY (MISCELLANEOUS) IMPLANT
HYDROGEN PEROXIDE 16OZ (MISCELLANEOUS) ×2 IMPLANT
KIT RM TURNOVER CYSTO AR (KITS) ×2 IMPLANT
LOOP VESSEL MAXI BLUE (MISCELLANEOUS) IMPLANT
NDL SAFETY ECLIPSE 18X1.5 (NEEDLE) IMPLANT
NEEDLE HYPO 18GX1.5 SHARP (NEEDLE)
NEEDLE HYPO 22GX1.5 SAFETY (NEEDLE) ×2 IMPLANT
NS IRRIG 500ML POUR BTL (IV SOLUTION) ×2 IMPLANT
PACK BASIN DAY SURGERY FS (CUSTOM PROCEDURE TRAY) ×2 IMPLANT
PAD ABD 8X10 STRL (GAUZE/BANDAGES/DRESSINGS) ×2 IMPLANT
PAD ARMBOARD 7.5X6 YLW CONV (MISCELLANEOUS) ×1 IMPLANT
PENCIL BUTTON HOLSTER BLD 10FT (ELECTRODE) ×2 IMPLANT
RETRACTOR STERILE 25.8CMX11.3 (INSTRUMENTS) IMPLANT
SPONGE GAUZE 4X4 12PLY STER LF (GAUZE/BANDAGES/DRESSINGS) ×2 IMPLANT
SPONGE HEMORRHOID 8X3CM (HEMOSTASIS) IMPLANT
SPONGE SURGIFOAM ABS GEL 12-7 (HEMOSTASIS) IMPLANT
SUCTION FRAZIER HANDLE 10FR (MISCELLANEOUS)
SUCTION TUBE FRAZIER 10FR DISP (MISCELLANEOUS) IMPLANT
SUT CHROMIC 2 0 SH (SUTURE) ×2 IMPLANT
SUT CHROMIC 3 0 SH 27 (SUTURE) IMPLANT
SUT ETHIBOND 0 (SUTURE) IMPLANT
SUT SILK 2 0 (SUTURE)
SUT SILK 2-0 18XBRD TIE 12 (SUTURE) IMPLANT
SUT VIC AB 2-0 CT2 27 (SUTURE) ×1 IMPLANT
SUT VIC AB 3-0 SH 18 (SUTURE) IMPLANT
SUT VIC AB 4-0 P-3 18XBRD (SUTURE) IMPLANT
SUT VIC AB 4-0 P3 18 (SUTURE)
SYR CONTROL 10ML LL (SYRINGE) ×2 IMPLANT
TOWEL OR 17X24 6PK STRL BLUE (TOWEL DISPOSABLE) ×2 IMPLANT
TRAY DSU PREP LF (CUSTOM PROCEDURE TRAY) ×2 IMPLANT
TUBE CONNECTING 12X1/4 (SUCTIONS) ×2 IMPLANT
YANKAUER SUCT BULB TIP NO VENT (SUCTIONS) ×2 IMPLANT

## 2016-10-11 NOTE — Anesthesia Procedure Notes (Signed)
Procedure Name: MAC Date/Time: 10/11/2016 7:33 AM Performed by: Suzette Battiest Pre-anesthesia Checklist: Patient identified, Emergency Drugs available, Suction available, Patient being monitored and Timeout performed Oxygen Delivery Method: Simple face mask Placement Confirmation: positive ETCO2,  CO2 detector and breath sounds checked- equal and bilateral

## 2016-10-11 NOTE — Op Note (Signed)
10/11/2016  7:57 AM  PATIENT:  Victoria [name]  56 y.o. female  Patient Care Team: Elby Showers, MD as PCP - General Kennith Center, RD as Dietitian (Family Medicine)  PRE-OPERATIVE DIAGNOSIS:  SEBACEOUS CYST  POST-OPERATIVE DIAGNOSIS:  SEBACEOUS CYST  PROCEDURE:   EXCISION OF PERIANAL MASS    Surgeon(s): [name], MD  ASSISTANT: none   ANESTHESIA:   local and MAC  SPECIMEN:  Source of Specimen:  R posterior perianal mass  DISPOSITION OF SPECIMEN:  PATHOLOGY  COUNTS:  YES  PLAN OF CARE: Discharge to home after PACU  PATIENT DISPOSITION:  PACU - hemodynamically stable.  INDICATION: 56 y.o. F a chronically inflamed perianal mass.   OR FINDINGS: right perianal mass with no signs of current inflammation or infection.  DESCRIPTION: the patient was identified in the preoperative holding area and taken to the OR where they were laid on the operating room table.  MAC anesthesia was induced without difficulty. The patient was then positioned in prone jackknife position with buttocks gently taped apart.  The patient was then prepped and draped in usual sterile fashion.  SCDs were noted to be in place prior to the initiation of anesthesia. A surgical timeout was performed indicating the correct patient, procedure, positioning and need for preoperative antibiotics.  A field block was performed using Marcaine with epinephrine.    Identified the mass in the right posterior midline just below the level of the coccyx. It was very small with a punctation in the middle. I incised around this in an elliptical fashion with a 15 blade scalpel. Dissection was carried down to the subcutaneous layer and the mass was excised.  I then closed the dermal layer using 3 2-0 Vicryl interrupted subcuticular sutures.  The skin was closed with Dermabond. The patient was then awakened from anesthesia and sent to the postanesthesia care unit in stable condition. All counts were correct per operating room  staff.

## 2016-10-11 NOTE — H&P (Signed)
History of Present Illness The patient is a 56 year old female who presents with a complaint of anal problems. 56 year old female who presents to the office for evaluation of a perianal wound. She states that all last year she had almost monthly recurrences of right-sided perianal wound with drainage. She is only had one recurrence this year in April. 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 Over the past couple days she has developed increasing pain and pressure and is here for repeat evaluation.    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 Gallbladder Surgery - Open  Hip Surgery  Bilateral. Hysterectomy (not due to cancer) - Partial  Oral Surgery  Thyroid Surgery   Diagnostic Studies History [name], MD; 7/61/6073 4:01 PM) Colonoscopy  5-10 years ago Mammogram  1-3 years ago Pap Smear  1-5 years ago  Allergies ([name], [place]; 08/20/2016 3:46 PM) Estradiol *ESTROGENS*  patches only- rash Allergies Reconciled   Medication History ([name], [place] ; 08/20/2016 3:46 PM) Multiple Vitamin (Oral) Active. Calcium + D (150-261-330MG -MG-IU Capsule, Oral) Active. Acarbose (25MG  Tablet, Oral HALF A TAB) Active. Cyanocobalamin (1000MCG/ML Solution, Injection) Active. Cholecalciferol (2000UNIT Tablet Chewable, Oral) Active. Phenergan (25MG  Tablet, Oral) Active. Hydrocodone-Acetaminophen (10-325MG  Tablet, Oral) Active. Cyclobenzaprine HCl (10MG  Tablet, Oral) Active. Estradiol (1MG  Tablet, Oral) Active. Aspirin Childrens (81MG  Tablet Chewable,  Oral) Active. Protonix (40MG  Packet, Oral) Active. Levothyroxine Sodium (150MCG Tablet, Oral) Active. ValACYclovir HCl (500MG  Tablet, Oral) Active. Divigel (1MG /GM Gel, Transdermal) Active. Medications Reconciled  Social History [name], MD; 09/05/6267 4:01 PM) Alcohol use  Remotely quit alcohol use. Caffeine use  Carbonated beverages. No drug use  Tobacco use  Never smoker.  Family History [name], MD; 4/85/4627 4:01 PM) Cerebrovascular Accident  Father. Diabetes Mellitus  Family Members In General, Father, Sister. Heart Disease  Father. Hypertension  Father, Mother, Sister. Seizure disorder  Brother. Thyroid problems  Mother.  Pregnancy / Birth History [name], MD; 0/35/0093 4:01 PM) Age at menarche  67 years. Gravida  2 Maternal age  88-20 Para  1  Other Problems [name], MD; 10/14/2991 4:01 PM) Arthritis  Back Pain  Chest pain  Gastroesophageal Reflux Disease  Migraine Headache  Thyroid Disease  Transfusion history  GASTRIC BYPASS STATUS FOR OBESITY (Z98.84)     Review of Systems  General Present- Fatigue, Night Sweats and Weight Gain. Not Present- Appetite Loss, Chills, Fever and Weight Loss. Skin Not Present- Change in Wart/Mole, Dryness, Hives, Jaundice, New Lesions, Non-Healing Wounds, Rash and Ulcer. HEENT Present- Wears glasses/contact lenses. Not Present- Earache, Hearing Loss, Hoarseness, Nose Bleed, Oral Ulcers, Ringing in the Ears, Seasonal Allergies, Sinus Pain, Sore Throat, Visual Disturbances and Yellow Eyes. Respiratory Present- Snoring. Not Present- Bloody sputum, Chronic Cough, Difficulty Breathing and Wheezing. Cardiovascular Not Present- Chest Pain, Difficulty Breathing Lying Down, Leg Cramps, Palpitations, Rapid Heart Rate, Shortness of Breath and Swelling of Extremities. Gastrointestinal Present- Nausea. Not Present- Abdominal Pain, Bloating, Bloody Stool, Change in Bowel Habits, Chronic  diarrhea, Constipation, Difficulty Swallowing, Excessive gas, Gets full quickly at meals, Hemorrhoids, Indigestion, Rectal Pain and Vomiting. Female Genitourinary Not Present-  Frequency, Nocturia, Painful Urination, Pelvic Pain and Urgency. Musculoskeletal Present- Back Pain, Joint Pain and Joint Stiffness. Not Present- Muscle Pain, Muscle Weakness and Swelling of Extremities. Neurological Present- Headaches. Not Present- Decreased Memory, Fainting, Numbness, Seizures, Tingling, Tremor, Trouble walking and Weakness. Psychiatric Not Present- Anxiety, Bipolar, Change in Sleep Pattern, Depression, Fearful and Frequent crying. Endocrine Present- Hot flashes. Not Present- Cold Intolerance, Excessive Hunger, Hair Changes, Heat Intolerance and New Diabetes. Hematology Present- Gland problems. Not Present- Easy Bruising, Excessive bleeding, HIV and Persistent Infections.  BP 107/70   Pulse 64   Temp 97.9 F (36.6 C) (Oral)   Resp 16   Ht 5\' 7"  (1.702 m)   Wt 98.7 kg (217 lb 8 oz)   SpO2 100%   BMI 34.07 kg/m     Physical Exam Mental Status-Alert. General Appearance-Cooperative, Not in acute distress. Build & Nutrition-Well nourished. Posture-Normal posture. Gait-Normal.  Head and Neck Head-normocephalic, atraumatic with no lesions or palpable masses. Trachea-midline.  Chest and Lung Exam Chest and lung exam reveals -on auscultation, normal breath sounds, no adventitious sounds and normal vocal resonance.  Cardiovascular Cardiovascular examination reveals -normal heart sounds, regular rate and rhythm with no murmurs and no digital clubbing, cyanosis, edema, increased warmth or tenderness.  Abdomen Inspection Inspection of the abdomen reveals - No Hernias. Palpation/Percussion Palpation and Percussion of the abdomen reveal - Soft, Non Tender, No Rigidity (guarding), No hepatosplenomegaly and No Palpable abdominal masses.  Female Genitourinary Note: right posterior  site that appears healed over today. no palpable cord   Rectal Note: Palpable 1.5 cm mass in the posterior right buttock, near midline, just above the level of the coccyx   Neurologic Neurologic evaluation reveals -alert and oriented x 3 with no impairment of recent or remote memory, normal attention span and ability to concentrate, normal sensation and normal coordination.  Musculoskeletal Normal Exam - Bilateral-Upper Extremity Strength Normal and Lower Extremity Strength Normal.    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 She states that it flares up almost every month. It is started to swell over the past few days and she comes back to the office for evaluation. On exam she has a nodule noted in her right posterior buttock just above the level of the coccyx which is tender to palpation. There is no fluctuance noted. There is no palpable cord. It does not appear to be hidradenitis. I think this is most likely a sebaceous cyst that is chronically inflamed. I have recommended excision.

## 2016-10-11 NOTE — Discharge Instructions (Addendum)
Beginning the day of surgery:   Eat a regular diet high in fiber.  Avoid foods that give you constipation or diarrhea.  Avoid foods that are difficult to digest, such as seeds, nuts, corn or popcorn.  Do not go any longer than 2 days without a bowel movement.  You may take a dose of Milk of Magnesia if you become constipated.    Drink 6-8 glasses of water daily.  Walking is encouraged.  Avoid strenuous activity and heavy lifting for one month after surgery.    Sit on a soft pillow gently for the next week while the area heals.  You may shower as your dressing is waterproof.  It will wear off in 7-10 days.    Call the office if you have any questions or concerns.  Call immediately if you develop:   Excessive rectal bleeding (more than a cup or passing large clots)  Increased discomfort  Fever greater than 100 F  Difficulty urinating  Post Anesthesia Home Care Instructions  Activity: Get plenty of rest for the remainder of the day. A responsible individual must stay with you for 24 hours following the procedure.  For the next 24 hours, DO NOT: -Drive a car -Paediatric nurse -Drink alcoholic beverages -Take any medication unless instructed by your physician -Make any legal decisions or sign important papers.  Meals: Start with liquid foods such as gelatin or soup. Progress to regular foods as tolerated. Avoid greasy, spicy, heavy foods. If nausea and/or vomiting occur, drink only clear liquids until the nausea and/or vomiting subsides. Call your physician if vomiting continues.  Special Instructions/Symptoms: Your throat may feel dry or sore from the anesthesia or the breathing tube placed in your throat during surgery. If this causes discomfort, gargle with warm salt water. The discomfort should disappear within 24 hours.  If you had a scopolamine patch placed behind your ear for the management of post- operative nausea and/or vomiting:  1. The medication in the patch is  effective for 72 hours, after which it should be removed.  Wrap patch in a tissue and discard in the trash. Wash hands thoroughly with soap and water. 2. You may remove the patch earlier than 72 hours if you experience unpleasant side effects which may include dry mouth, dizziness or visual disturbances. 3. Avoid touching the patch. Wash your hands with soap and water after contact with the patch.

## 2016-10-11 NOTE — Anesthesia Postprocedure Evaluation (Signed)
Anesthesia Post Note  Patient: Victoria [name]  Procedure(s) Performed: Procedure(s) (LRB): EXCISION OF PERIANAL MASS (N/A)     Patient location during evaluation: PACU Anesthesia Type: MAC Level of consciousness: awake and alert Pain management: pain level controlled Vital Signs Assessment: post-procedure vital signs reviewed and stable Respiratory status: spontaneous breathing, nonlabored ventilation, respiratory function stable and patient connected to nasal cannula oxygen Cardiovascular status: stable and blood pressure returned to baseline Anesthetic complications: no    Last Vitals:  Vitals:   10/11/16 0845 10/11/16 0900  BP: 108/78 101/82  Pulse: (!) 50 (!) 53  Resp: 11 10  Temp:    SpO2: 94% 98%    Last Pain:  Vitals:   10/11/16 0600  TempSrc: Oral                 Tiajuana Amass

## 2016-10-11 NOTE — Transfer of Care (Signed)
Last Vitals:  Vitals:   10/11/16 0600  BP: 107/70  Pulse: 64  Resp: 16  Temp: 36.6 C  SpO2: 100%    Last Pain:  Vitals:   10/11/16 0600  TempSrc: Oral      Patients Stated Pain Goal: 5 (10/11/16 7209)  Immediate Anesthesia Transfer of Care Note  Patient: Victoria [name]  Procedure(s) Performed: Procedure(s) (LRB): EXCISION OF PERIANAL MASS (N/A)  Patient Location: PACU  Anesthesia Type:MAC   Level of Consciousness: awake, alert  and oriented  Airway & Oxygen Therapy: Patient Spontanous Breathing and Patient connected to face mask oxygen  Post-op Assessment: Report given to PACU RN and Post -op Vital signs reviewed and stable  Post vital signs: Reviewed and stable  Complications: No apparent anesthesia complications

## 2016-10-12 ENCOUNTER — Encounter: Payer: Self-pay | Admitting: Endocrinology

## 2016-10-12 ENCOUNTER — Encounter (HOSPITAL_BASED_OUTPATIENT_CLINIC_OR_DEPARTMENT_OTHER): Payer: Self-pay | Admitting: General Surgery

## 2016-10-18 ENCOUNTER — Telehealth: Payer: Self-pay | Admitting: Obstetrics & Gynecology

## 2016-10-18 NOTE — Telephone Encounter (Signed)
Left message for patient to call and reschedule her appt with Dr Sabra Heck 10/19/16

## 2016-10-19 ENCOUNTER — Ambulatory Visit: Payer: Self-pay | Admitting: Obstetrics & Gynecology

## 2016-10-22 ENCOUNTER — Telehealth: Payer: Self-pay | Admitting: Obstetrics & Gynecology

## 2016-10-22 NOTE — Telephone Encounter (Signed)
Patient calling to reschedule her DR CX appointment from Friday 10/19/16.  Patient requesting an asap appointment

## 2016-10-25 DIAGNOSIS — E669 Obesity, unspecified: Secondary | ICD-10-CM | POA: Diagnosis not present

## 2016-10-25 DIAGNOSIS — Z6834 Body mass index (BMI) 34.0-34.9, adult: Secondary | ICD-10-CM | POA: Diagnosis not present

## 2016-10-25 DIAGNOSIS — Z713 Dietary counseling and surveillance: Secondary | ICD-10-CM | POA: Diagnosis not present

## 2016-10-25 DIAGNOSIS — K912 Postsurgical malabsorption, not elsewhere classified: Secondary | ICD-10-CM | POA: Diagnosis not present

## 2016-10-25 DIAGNOSIS — Z9884 Bariatric surgery status: Secondary | ICD-10-CM | POA: Diagnosis not present

## 2016-10-31 ENCOUNTER — Other Ambulatory Visit: Payer: Self-pay | Admitting: Endocrinology

## 2016-11-02 ENCOUNTER — Other Ambulatory Visit: Payer: 59

## 2016-11-05 ENCOUNTER — Ambulatory Visit: Payer: 59 | Admitting: Endocrinology

## 2016-11-05 ENCOUNTER — Ambulatory Visit (INDEPENDENT_AMBULATORY_CARE_PROVIDER_SITE_OTHER): Payer: 59 | Admitting: Obstetrics & Gynecology

## 2016-11-05 ENCOUNTER — Encounter: Payer: Self-pay | Admitting: Obstetrics & Gynecology

## 2016-11-05 VITALS — BP 106/54 | HR 68 | Resp 14 | Ht 66.5 in | Wt 218.0 lb

## 2016-11-05 DIAGNOSIS — Z01419 Encounter for gynecological examination (general) (routine) without abnormal findings: Secondary | ICD-10-CM

## 2016-11-05 DIAGNOSIS — Z1211 Encounter for screening for malignant neoplasm of colon: Secondary | ICD-10-CM | POA: Diagnosis not present

## 2016-11-05 DIAGNOSIS — N898 Other specified noninflammatory disorders of vagina: Secondary | ICD-10-CM

## 2016-11-05 MED ORDER — ESTRADIOL 1 MG/GM TD GEL: 1.0000 | Freq: Every day | TRANSDERMAL | 4 refills | Status: DC

## 2016-11-05 NOTE — Progress Notes (Addendum)
56 y.o. G2P0112 Married [place] F here for annual exam.  Has been gaining weight.  Feeling better.  Having less hypoglycemic episodes.  Saw nutritionist at [facility].    Using divigel.  Is much better with this.  Rash on skin is gone.  Did have perirectal lesion removed with Dr. Marcello Moores.  This was ultimately a chronically draining sebaceous cyst.    Pt is having recurrent discharge again. This resolved with the vaginal steroid suppositories.  She wants treatment that will "cure" this.  Likely it is due to PMP changes and therefore there isn't a cure.  She doesn't want treatment if this is the cause.  No LMP recorded. Patient has had a hysterectomy.          Sexually active: Yes.    The current method of family planning is status post hysterectomy.   Exercising: Yes.    stationary bike Smoker:  no  Health Maintenance: Pap:  02/11/12 negative  History of abnormal Pap:  no MMG:  04/26/16 BIRADS 1 negative  Colonoscopy:  10/17/07 polyp- patient states she is due  BMD:   08/17/08 normal per patient  TDaP:  01/10/12  Pneumonia vaccine(s):  never Zostavax:   never Hep C testing: 08/08/15 negative Screening Labs: PCP, Hb today: PCP   reports that she has never smoked. She has never used smokeless tobacco. She reports that she does not drink alcohol or use drugs.  Past Medical History:  Diagnosis Date  . Chronic leukopenia followed by dr Alvy Bimler (oncology/hematology)   intermittant since 2010  . Degenerative joint disease of low back 09/02/15    Dr. Maureen Ralphs  . Fibrocystic breast   . GERD (gastroesophageal reflux disease)   . Hemorrhoids   . History of Clostridium difficile infection 12/2010  . History of exercise intolerance    ETT on 04-27-2016-- negative ([facility] treadmill score 7)  . History of Helicobacter pylori infection 2001 and 09/ 2012  . HSV-2 infection    genital  . Hx of adenomatous colonic polyps 10/17/2007  . Hydradenitis    per pt currently no tx  . Hypothyroidism,  postsurgical 1980  . Osteoarthritis   . PONV (postoperative nausea and vomiting)   . 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 Reactive hypoglycemia endocrinologist-- dr Dwyane Dee   post gastrectomy dumping syndrome  . Sebaceous cyst    PERIANAL AREA  . Varicose veins   . Vitamin B 12 deficiency   . Vitamin D deficiency     Past Surgical History:  Procedure Laterality Date  . BREATH TEK H PYLORI N/A 05/03/2014   Procedure: BREATH TEK H PYLORI;  Surgeon: Kaylyn Lim, MD;  Location: [facility];  Service: General;  Laterality: N/A;  . CARDIOVASCULAR STRESS TEST  03/11/2013   dr Tressia Miners turner   Low risk nuclear study w/ a very small anterior perfusion defect that most likely represents breast attenuation artifact, less likely this could represent a true perfusion abnormality in the distribution of diagonal branch of the LAD/  normal LV function and wall motion , ef 66%  . CESAREAN SECTION  1985  . CHOLECYSTECTOMY OPEN  1987  . GASTRIC ROUX-EN-Y N/A 07/06/2014   Procedure: LAPAROSCOPIC ROUX-EN-Y GASTRIC BYPASS, ENTEROLYSIS OF ADHESIONS WITH UPPER ENDOSCOPY;  Surgeon: Johnathan Hausen, MD;  Location: [facility];  Service: General;  Laterality: N/A;  . HAMMER TOE SURGERY Bilateral 02/17/2008   bilateral foot digit's 2 and 5  . HIATAL HERNIA REPAIR N/A 07/06/2014   Procedure: LAPAROSCOPIC REPAIR OF  HIATAL HERNIA;  Surgeon: Johnathan Hausen, MD;  Location: [facility];  Service: General;  Laterality: N/A;  . MASS EXCISION N/A 10/11/2016   Procedure: EXCISION OF PERIANAL MASS;  Surgeon: [name], MD;  Location: [place];  Service: General;  Laterality: N/A;  . REFRACTIVE SURGERY    . THYROIDECTOMY  1980  . TOTAL ABDOMINAL HYSTERECTOMY  08-25-2002   dr Margaretha Glassing   ovaries retained  . TOTAL HIP ARTHROPLASTY Right 05/21/2012   Procedure: TOTAL HIP ARTHROPLASTY;  Surgeon: Gearlean Alf, MD;  Location: [facility];  Service: Orthopedics;  Laterality: Right;  . TOTAL HIP ARTHROPLASTY  Left 01-31-2009  dr Wynelle Link  . TRANSTHORACIC ECHOCARDIOGRAM  05/03/2016   ef 55-60%/  trivial MR/  mild TR    Current Outpatient Prescriptions  Medication Sig Dispense Refill  . ALPRAZolam (XANAX) 0.5 MG tablet TAKE 1 TABLET BY MOUTH AT BEDTIME (Patient taking differently: TAKE 1 TABLET BY MOUTH AT BEDTIME PRN) 30 tablet 5  . Calcium Carbonate-Vitamin D (CALCIUM-VITAMIN D3 PO) Take 15 mLs by mouth 2 (two) times daily.     . cyanocobalamin (,VITAMIN B-12,) 1000 MCG/ML injection INJECT 1 ML INTRAMUSCULARLY EVERY MONTH 25 mL 0  . cyclobenzaprine (FLEXERIL) 10 MG tablet TAKE 1 TABLET BY MOUTH AT BEDTIME -- TAKES PRN    . DIVIGEL 1 MG/GM GEL APPLY 1 MG TOPICALLY DAILY. 30 g 0  . levothyroxine (SYNTHROID, LEVOTHROID) 100 MCG tablet TAKE 1 TABLET BY MOUTH ONCE DAILY--- TAKES IN AM    . Multiple Vitamins-Minerals (MULTIVITAMIN PO) Take 2 capsules by mouth daily.     . pantoprazole (PROTONIX) 40 MG tablet TAKE 1 TABLET BY MOUTH TWICE DAILY--- PRN    . Polyethylene Glycol 3350 (MIRALAX PO) Take by mouth as needed.    . promethazine (PHENERGAN) 25 MG tablet TAKE 1 TABLET BY MOUTH EVERY 8 HOURS AS NEEDED FOR NAUSEA AND VOMITING 30 tablet 1  . traMADol (ULTRAM) 50 MG tablet Take 1-2 tablets (50-100 mg total) by mouth every 6 (six) hours as needed. 20 tablet 0  . valACYclovir (VALTREX) 500 MG tablet TAKE 1 TABLET BY MOUTH ONCE DAILY PRN     No current facility-administered medications for this visit.     Family History  Problem Relation Age of Onset  . Diabetes Father   . Diabetes Sister   . Hypertension Sister   . Hypertension Mother     ROS:  Pertinent items are noted in HPI.  Otherwise, a comprehensive ROS was negative.  Exam:   BP (!) 106/54 (BP Location: Right Arm, Patient Position: Sitting, Cuff Size: Normal)   Pulse 68   Resp 14   Ht 5' 6.5" (1.689 m)   Wt 218 lb (98.9 kg)   BMI 34.66 kg/m    Height: 5' 6.5" (168.9 cm)  Ht Readings from Last 3 Encounters:  11/05/16 5' 6.5" (1.689  m)  10/11/16 5\' 7"  (1.702 m)  09/03/16 5' 6.5" (1.689 m)    General appearance: alert, cooperative and appears stated age Head: Normocephalic, without obvious abnormality, atraumatic Neck: no adenopathy, supple, symmetrical, trachea midline and thyroid normal to inspection and palpation Lungs: clear to auscultation bilaterally Breasts: normal appearance, no masses or tenderness Heart: regular rate and rhythm Abdomen: soft, non-tender; bowel sounds normal; no masses,  no organomegaly Extremities: extremities normal, atraumatic, no cyanosis or edema Skin: Skin color, texture, turgor normal. No rashes or lesions Lymph nodes: Cervical, supraclavicular, and axillary nodes normal. No abnormal inguinal nodes palpated Neurologic: Grossly normal  Pelvic: External genitalia:  no lesions              Urethra:  normal appearing urethra with no masses, tenderness or lesions              Bartholins and Skenes: normal                 Vagina: normal appearing vagina with normal color and discharge, no lesions              Cervix: absent              Pap taken: No. Bimanual Exam:  Uterus:  uterus absent              Adnexa: no mass, fullness, tenderness               Rectovaginal: Confirms               Anus:  normal sphincter tone, no lesions; area of chronic drainage is healing well  Chaperone was present for exam.  A:  Well Woman with normal exam H/O gastric bypass Hypothyroidism Vit D deficiency H/o pernicious anemia H/o C. diff infection On HRT HSV H/O TAH 6/04   P:   Mammogram guidelines reviewed.  Reviewed 3D MMG with pt Pap smear not indicated  Affirm obtained today Referral to GI placed for colonoscopy screening Divigel, one packet daily.  #90/4RF sent to pharmacy. return annually or prn  Addendum:  Pt refused to schedule colonoscopy referral appt after referral was made.  Referral closed.

## 2016-11-06 LAB — VAGINITIS/VAGINOSIS, DNA PROBE
Candida Species: NEGATIVE
Gardnerella vaginalis: NEGATIVE
Trichomonas vaginosis: NEGATIVE

## 2016-11-08 DIAGNOSIS — D510 Vitamin B12 deficiency anemia due to intrinsic factor deficiency: Secondary | ICD-10-CM | POA: Insufficient documentation

## 2016-11-08 DIAGNOSIS — G4700 Insomnia, unspecified: Secondary | ICD-10-CM | POA: Insufficient documentation

## 2016-11-22 MED FILL — DIVIGEL 1 MG GEL PACKET: 1 | 90 days supply | Qty: 90 | Fill #0

## 2016-12-03 MED FILL — PANTOPRAZOLE SOD DR 40 MG T: 40 | 30 days supply | Qty: 60 | Fill #1

## 2016-12-28 ENCOUNTER — Other Ambulatory Visit: Payer: Self-pay | Admitting: Internal Medicine

## 2016-12-28 MED FILL — CYCLOBENZAPRINE 10 MG TAB: 10 | 30 days supply | Qty: 30 | Fill #4

## 2016-12-28 MED FILL — PROMETHAZINE 25 MG TABLET: 25 | 10 days supply | Qty: 30 | Fill #0

## 2016-12-28 MED FILL — ALPRAZolam 0.5 MG TABS: 0.5 | 30 days supply | Qty: 30 | Fill #0

## 2016-12-28 MED FILL — LEVOTHYROXINE 100 MCG TAB: 100 | 90 days supply | Qty: 90 | Fill #0

## 2016-12-28 NOTE — Telephone Encounter (Signed)
Refill all x 6 months 

## 2017-02-20 ENCOUNTER — Other Ambulatory Visit: Payer: Self-pay | Admitting: Internal Medicine

## 2017-02-21 MED FILL — CYANOCOBALAMIN 1,000 MCG/ML: 1000 | 90 days supply | Qty: 3 | Fill #0

## 2017-02-26 HISTORY — PX: COLONOSCOPY: SHX174

## 2017-03-01 MED FILL — DIVIGEL 1 MG GEL PACKET: 1 | 90 days supply | Qty: 90 | Fill #1

## 2017-03-01 MED FILL — PANTOPRAZOLE SOD DR 40 MG T: 40 | 30 days supply | Qty: 60 | Fill #2

## 2017-03-01 MED FILL — ALPRAZolam 0.5 MG TABS: 0.5 | 30 days supply | Qty: 30 | Fill #1

## 2017-03-01 MED FILL — CYCLOBENZAPRINE 10 MG TAB: 10 | 30 days supply | Qty: 30 | Fill #5

## 2017-03-01 MED FILL — PEG-3350 AND ELECTROLYTES S: 236 | 2 days supply | Qty: 4000 | Fill #0

## 2017-03-05 ENCOUNTER — Other Ambulatory Visit: Payer: Self-pay | Admitting: Internal Medicine

## 2017-03-05 DIAGNOSIS — E559 Vitamin D deficiency, unspecified: Secondary | ICD-10-CM

## 2017-03-05 DIAGNOSIS — E039 Hypothyroidism, unspecified: Secondary | ICD-10-CM

## 2017-03-05 DIAGNOSIS — Z13220 Encounter for screening for lipoid disorders: Secondary | ICD-10-CM

## 2017-03-05 DIAGNOSIS — R7302 Impaired glucose tolerance (oral): Secondary | ICD-10-CM

## 2017-03-05 DIAGNOSIS — E538 Deficiency of other specified B group vitamins: Secondary | ICD-10-CM

## 2017-03-05 DIAGNOSIS — Z0000 Encounter for general adult medical examination without abnormal findings: Secondary | ICD-10-CM

## 2017-03-15 ENCOUNTER — Other Ambulatory Visit: Payer: No Typology Code available for payment source | Admitting: Internal Medicine

## 2017-03-15 DIAGNOSIS — R7302 Impaired glucose tolerance (oral): Secondary | ICD-10-CM

## 2017-03-15 DIAGNOSIS — E538 Deficiency of other specified B group vitamins: Secondary | ICD-10-CM

## 2017-03-15 DIAGNOSIS — Z13220 Encounter for screening for lipoid disorders: Secondary | ICD-10-CM

## 2017-03-15 DIAGNOSIS — Z0000 Encounter for general adult medical examination without abnormal findings: Secondary | ICD-10-CM

## 2017-03-15 DIAGNOSIS — E039 Hypothyroidism, unspecified: Secondary | ICD-10-CM

## 2017-03-15 DIAGNOSIS — Z96642 Presence of left artificial hip joint: Secondary | ICD-10-CM

## 2017-03-15 DIAGNOSIS — E559 Vitamin D deficiency, unspecified: Secondary | ICD-10-CM

## 2017-03-16 LAB — CBC WITH DIFFERENTIAL/PLATELET
Basophils Absolute: 20 cells/uL (ref 0–200)
Basophils Relative: 0.7 %
Eosinophils Absolute: 81 cells/uL (ref 15–500)
Eosinophils Relative: 2.8 %
HCT: 36.5 % (ref 35.0–45.0)
Hemoglobin: 12.3 g/dL (ref 11.7–15.5)
Lymphs Abs: 835 cells/uL — ABNORMAL LOW (ref 850–3900)
MCH: 31.7 pg (ref 27.0–33.0)
MCHC: 33.7 g/dL (ref 32.0–36.0)
MCV: 94.1 fL (ref 80.0–100.0)
MPV: 11.6 fL (ref 7.5–12.5)
Monocytes Relative: 9.4 %
Neutro Abs: 1691 cells/uL (ref 1500–7800)
Neutrophils Relative %: 58.3 %
Platelets: 228 10*3/uL (ref 140–400)
RBC: 3.88 10*6/uL (ref 3.80–5.10)
RDW: 12.1 % (ref 11.0–15.0)
Total Lymphocyte: 28.8 %
WBC mixed population: 273 cells/uL (ref 200–950)
WBC: 2.9 10*3/uL — ABNORMAL LOW (ref 3.8–10.8)

## 2017-03-16 LAB — COMPLETE METABOLIC PANEL WITH GFR
AG Ratio: 1.4 (calc) (ref 1.0–2.5)
ALT: 12 U/L (ref 6–29)
AST: 16 U/L (ref 10–35)
Albumin: 4 g/dL (ref 3.6–5.1)
Alkaline phosphatase (APISO): 56 U/L (ref 33–130)
BUN: 16 mg/dL (ref 7–25)
CO2: 27 mmol/L (ref 20–32)
Calcium: 9.1 mg/dL (ref 8.6–10.4)
Chloride: 105 mmol/L (ref 98–110)
Creat: 0.93 mg/dL (ref 0.50–1.05)
GFR, Est African American: 80 mL/min/{1.73_m2} (ref >=60)
GFR, Est Non African American: 69 mL/min/{1.73_m2} (ref >=60)
Globulin: 2.8 g/dL (calc) (ref 1.9–3.7)
Glucose, Bld: 78 mg/dL (ref 65–99)
Potassium: 4.3 mmol/L (ref 3.5–5.3)
Sodium: 139 mmol/L (ref 135–146)
Total Bilirubin: 0.5 mg/dL (ref 0.2–1.2)
Total Protein: 6.8 g/dL (ref 6.1–8.1)

## 2017-03-16 LAB — VITAMIN B12: Vitamin B-12: 1189 pg/mL — ABNORMAL HIGH (ref 200–1100)

## 2017-03-16 LAB — LIPID PANEL
Cholesterol: 178 mg/dL (ref <200)
HDL: 91 mg/dL (ref >50)
LDL Cholesterol (Calc): 76 mg/dL (calc)
Non-HDL Cholesterol (Calc): 87 mg/dL (calc) (ref <130)
Total CHOL/HDL Ratio: 2 (calc) (ref <5.0)
Triglycerides: 38 mg/dL (ref <150)

## 2017-03-16 LAB — HEMOGLOBIN A1C
Hgb A1c MFr Bld: 5.5 % of total Hgb (ref <5.7)
Mean Plasma Glucose: 111 (calc)
eAG (mmol/L): 6.2 (calc)

## 2017-03-16 LAB — VITAMIN D 25 HYDROXY (VIT D DEFICIENCY, FRACTURES): Vit D, 25-Hydroxy: 32 ng/mL (ref 30–100)

## 2017-03-16 LAB — TSH: TSH: 1.83 mIU/L (ref 0.40–4.50)

## 2017-03-18 ENCOUNTER — Encounter: Payer: Self-pay | Admitting: Internal Medicine

## 2017-03-18 ENCOUNTER — Ambulatory Visit (INDEPENDENT_AMBULATORY_CARE_PROVIDER_SITE_OTHER): Payer: No Typology Code available for payment source | Admitting: Internal Medicine

## 2017-03-18 VITALS — BP 102/70 | HR 74 | Temp 98.0°F | Ht 66.0 in | Wt 220.0 lb

## 2017-03-18 DIAGNOSIS — G8929 Other chronic pain: Secondary | ICD-10-CM

## 2017-03-18 DIAGNOSIS — E538 Deficiency of other specified B group vitamins: Secondary | ICD-10-CM | POA: Diagnosis not present

## 2017-03-18 DIAGNOSIS — M545 Low back pain, unspecified: Secondary | ICD-10-CM

## 2017-03-18 DIAGNOSIS — E039 Hypothyroidism, unspecified: Secondary | ICD-10-CM | POA: Diagnosis not present

## 2017-03-18 DIAGNOSIS — R51 Headache: Secondary | ICD-10-CM

## 2017-03-18 DIAGNOSIS — M25552 Pain in left hip: Secondary | ICD-10-CM

## 2017-03-18 DIAGNOSIS — Z0000 Encounter for general adult medical examination without abnormal findings: Secondary | ICD-10-CM

## 2017-03-18 DIAGNOSIS — Z9884 Bariatric surgery status: Secondary | ICD-10-CM

## 2017-03-18 DIAGNOSIS — R42 Dizziness and giddiness: Secondary | ICD-10-CM | POA: Diagnosis not present

## 2017-03-18 DIAGNOSIS — R519 Headache, unspecified: Secondary | ICD-10-CM

## 2017-03-18 LAB — POCT URINALYSIS DIPSTICK
Appearance: NORMAL
Bilirubin, UA: NEGATIVE
Glucose, UA: NEGATIVE
Ketones, UA: NEGATIVE
Leukocytes, UA: NEGATIVE
Nitrite, UA: NEGATIVE
Odor: NORMAL
Protein, UA: NEGATIVE
Spec Grav, UA: 1.015 (ref 1.010–1.025)
Urobilinogen, UA: 0.2 E.U./dL
pH, UA: 6 (ref 5.0–8.0)

## 2017-03-18 MED ORDER — HYDROCODONE-ACETAMINOPHEN 10-325 MG PO TABS: 1.0000 | ORAL_TABLET | ORAL | 0 refills | Status: DC | PRN

## 2017-03-18 MED FILL — HYDROCODON-APAP 10-325: 10-325 | 5 days supply | Qty: 30 | Fill #0

## 2017-03-19 ENCOUNTER — Other Ambulatory Visit (HOSPITAL_COMMUNITY): Payer: Self-pay | Admitting: Physician Assistant

## 2017-03-19 DIAGNOSIS — Z96642 Presence of left artificial hip joint: Secondary | ICD-10-CM

## 2017-03-20 ENCOUNTER — Encounter: Payer: Self-pay | Admitting: Internal Medicine

## 2017-03-20 NOTE — Patient Instructions (Signed)
Try to arrange for MRI of the brain with and without contrast.  Continue follow-up with orthopedist.  Please see Dr. Marcello Moores regarding recurrent carbuncles/epidermal cyst in perianal area.  Continue monthly B12 injections and thyroid replacement.

## 2017-03-20 NOTE — Progress Notes (Signed)
Subjective:    Patient ID: Victoria [name], female    DOB: 1961-01-16, 57 y.o.   MRN: [id number]  HPI 57 year old female in today for health maintenance exam and evaluation of medical issues.  Recently has been having issues with pain in left hip and buttock area.  Has seen Dr. Peri Maris PA about this.  Is to have an MRI of her back apparently and also a bone scan as she has a left hip arthroplasty.  She has been having headaches frequently.  These headaches may last 3 days per episode.  She is having vertigo with these headaches.  No nausea and vomiting with these headaches. She basically just has to rest when this is going on.  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  She wants to have an MRI of the brain.  She has had recurrent carbuncle of her perianal area.  This was excised by Dr. Marcello Moores in August.  Pathology showed this to be a ruptured and inflamed epidermal inclusion cyst.  Have suggested that she see Dr. Marcello Moores once again.  She no longer suffers reactive hypoglycemia.  This started after gastric bypass surgery but she seems to have recovered nicely.  She saw gastric bypass [facility] at [facility] and seems to be managing it well.  She had gastric bypass surgery Roux-en-Y laparoscopic by Dr. Hassell Done in 2016.  She weighed 286 pounds before her surgery.  History of B12 deficiency and gets her self monthly B12 injections.  Remote history of hidradenitis supprativa.  She had a thyroidectomy in 1980 and is on thyroid replacement therapy.  Cholecystectomy 1987.  History of adenomatous colon polyp.  Had colonoscopy in 2009.  C-section for twins in 32.  Bilateral tubal ligation in the 1980s.  Total abdominal hysterectomy 2004.  Had left hip replacement December 2010.  Right hip replacement March 2014.  Endoscopy for epigastric pain 2012 by Dr. Olevia Perches.  History of H. pylori infection 2001.  History of C. difficile infection treated by Dr. Olevia Perches  in 2012.  Upper endoscopy 2012 showed chronic duodenitis treated with PPI.  History of GE reflux.  She saw Dr. Radford Pax in 2015 for chest pain.  2D echocardiogram was performed along with myocardial perfusion imaging which was normal.  In March 2018 she had an exercise tolerance test which was normal.  History of recurrent vaginitis issues seen by GYN physician.  History of hypothyroidism.  Family history: Mother living with history of diabetes and hypertension.  Father living with history of diabetes.  2 brothers and 1 sister.  Sister has hypothyroidism.  Social history: She divorced but remarried.  Husband has back issues and is disabled.  Does not smoke or consume alcohol.  2 adult daughters.  One daughter recently moved to [place] with her family.  Other daughter resides here and is a Education officer, museum [place].  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   Review of Systems see above     Objective:   Physical Exam  Constitutional: She is oriented to person, place, and time. She appears well-developed and well-nourished.  HENT:  Head: Normocephalic and atraumatic.  Right Ear: External ear normal.  Left Ear: External ear normal.  Mouth/Throat: Oropharynx is clear and moist.  Eyes: Conjunctivae and EOM are normal. Pupils are equal, round, and reactive to light. Left eye exhibits no discharge.  Funduscopic exam is benign.  Discs are sharp and  flat bilaterally and vasculature is normal.  Neck: No JVD present. No thyromegaly present.  Cardiovascular: Normal rate, regular rhythm, normal heart sounds and intact distal pulses.  No murmur heard. Pulmonary/Chest: Effort normal and breath sounds normal. No respiratory distress. She has no wheezes. She has no rales. She exhibits no tenderness.  Breasts normal female without masses  Abdominal: Soft. Bowel sounds are normal. She exhibits no distension and no mass. There is no tenderness.  There is no rebound and no guarding.  Genitourinary:  Genitourinary Comments: Deferred to GYN  Musculoskeletal: She exhibits no edema.  Lymphadenopathy:    She has no cervical adenopathy.  Neurological: She is alert and oriented to person, place, and time. No cranial nerve deficit. Coordination normal.  Straight leg raising is negative at 90 degrees on the left  Skin: Skin is warm and dry. No rash noted.  Psychiatric: She has a normal mood and affect. Her behavior is normal. Judgment and thought content normal.  Vitals reviewed.         Assessment & Plan:  Recurrent 3-day headaches associated with vertigo-etiology unclear but agree patient needs MRI of the brain with contrast for evaluation.  History of gastric bypass surgery with history of reactive hypoglycemia-now resolved  Left hip pain and low back pain being evaluated by orthopedist.  Hypothyroidism  B12 deficiency-takes monthly B12 injections  Apparent recurrence of epidermal inclusion cyst that was inflamed and perianal area.  Suggested patient contact Dr. Marcello Moores.  Status post bilateral hip arthroplasty  History of low white blood cell count which is been evaluated by Dr. Alvy Bimler and thought to be benign finding.  History of thyroidectomy with resulting hypothyroidism  Plan: Plan to get MRI of the brain with and without contrast for evaluation of headaches.  She is to follow-up with orthopedist regarding hip and back pain.  Continue with monthly B12 injections.  Encouraged her to contact Dr. Marcello Moores regarding recurrent perianal cyst.  Return in 6 months or as needed.

## 2017-03-21 LAB — CHROMIUM LEVEL: Chromium: 15.7 mcg/L — ABNORMAL HIGH (ref <1.3)

## 2017-03-21 LAB — COBALT: Cobalt, Plasma: 31.7 mcg/L — ABNORMAL HIGH (ref <1.9)

## 2017-03-28 ENCOUNTER — Other Ambulatory Visit: Payer: Self-pay | Admitting: Internal Medicine

## 2017-03-28 MED FILL — CYCLOBENZAPRINE 10 MG TAB: 10 | 90 days supply | Qty: 90 | Fill #0

## 2017-03-28 MED FILL — LEVOTHYROXINE 100 MCG TAB: 100 | 90 days supply | Qty: 90 | Fill #1

## 2017-03-29 ENCOUNTER — Encounter: Payer: Self-pay | Admitting: Obstetrics & Gynecology

## 2017-03-29 MED FILL — ALPRAZolam 0.5 MG TABS: 0.5 | 30 days supply | Qty: 30 | Fill #2

## 2017-04-11 DIAGNOSIS — M25562 Pain in left knee: Secondary | ICD-10-CM | POA: Insufficient documentation

## 2017-04-21 ENCOUNTER — Ambulatory Visit: Payer: Self-pay | Admitting: Orthopedic Surgery

## 2017-04-25 MED FILL — LINZESS 290 MCG CAPSULE: 290 | 90 days supply | Qty: 90 | Fill #0

## 2017-05-03 ENCOUNTER — Other Ambulatory Visit: Payer: Self-pay | Admitting: Internal Medicine

## 2017-05-03 DIAGNOSIS — Z139 Encounter for screening, unspecified: Secondary | ICD-10-CM

## 2017-05-06 ENCOUNTER — Encounter: Payer: Self-pay | Admitting: Internal Medicine

## 2017-05-06 ENCOUNTER — Ambulatory Visit (INDEPENDENT_AMBULATORY_CARE_PROVIDER_SITE_OTHER): Payer: No Typology Code available for payment source | Admitting: Internal Medicine

## 2017-05-06 VITALS — HR 72 | Temp 98.8°F | Ht 66.0 in | Wt 219.0 lb

## 2017-05-06 DIAGNOSIS — R509 Fever, unspecified: Secondary | ICD-10-CM | POA: Diagnosis not present

## 2017-05-06 DIAGNOSIS — J101 Influenza due to other identified influenza virus with other respiratory manifestations: Secondary | ICD-10-CM

## 2017-05-06 DIAGNOSIS — R6883 Chills (without fever): Secondary | ICD-10-CM | POA: Diagnosis not present

## 2017-05-06 LAB — CBC WITH DIFFERENTIAL/PLATELET
Basophils Absolute: 10 cells/uL (ref 0–200)
Basophils Relative: 0.3 %
Eosinophils Absolute: 218 cells/uL (ref 15–500)
Eosinophils Relative: 6.8 %
HCT: 34.6 % — ABNORMAL LOW (ref 35.0–45.0)
Hemoglobin: 11.7 g/dL (ref 11.7–15.5)
Lymphs Abs: 1050 cells/uL (ref 850–3900)
MCH: 31.2 pg (ref 27.0–33.0)
MCHC: 33.8 g/dL (ref 32.0–36.0)
MCV: 92.3 fL (ref 80.0–100.0)
MPV: 12.2 fL (ref 7.5–12.5)
Monocytes Relative: 13 %
Neutro Abs: 1507 cells/uL (ref 1500–7800)
Neutrophils Relative %: 47.1 %
Platelets: 198 10*3/uL (ref 140–400)
RBC: 3.75 10*6/uL — ABNORMAL LOW (ref 3.80–5.10)
RDW: 12.8 % (ref 11.0–15.0)
Total Lymphocyte: 32.8 %
WBC mixed population: 416 cells/uL (ref 200–950)
WBC: 3.2 10*3/uL — ABNORMAL LOW (ref 3.8–10.8)

## 2017-05-06 LAB — POCT INFLUENZA A/B: Influenza A, POC: POSITIVE — AB

## 2017-05-06 MED ORDER — HYDROCODONE-HOMATROPINE 5-1.5 MG/5ML PO SYRP: 5.0000 mL | ORAL_SOLUTION | Freq: Three times a day (TID) | ORAL | 0 refills | Status: DC | PRN

## 2017-05-06 MED ORDER — OSELTAMIVIR PHOSPHATE 75 MG PO CAPS: 75.0000 mg | ORAL_CAPSULE | Freq: Two times a day (BID) | ORAL | 0 refills | Status: DC

## 2017-05-06 MED ORDER — AZITHROMYCIN 250 MG PO TABS: ORAL_TABLET | ORAL | 0 refills | Status: DC

## 2017-05-06 MED FILL — AZITHROMYCIN 250 MG TABS: 250 | 5 days supply | Qty: 6 | Fill #0

## 2017-05-06 MED FILL — OSELTAMIVIR PHOSPHATE 75 MG: 75 | 5 days supply | Qty: 10 | Fill #0

## 2017-05-06 MED FILL — HYDROCODONE-HOMATROPINE SYR: 5-1.5 | 8 days supply | Qty: 120 | Fill #0

## 2017-05-08 NOTE — Patient Instructions (Signed)
Tamiflu 75 mg twice daily for 5 days.  Zithromax Z-Pak take as directed.  Hycodan 1 teaspoon p.o. every 8 hours as needed cough.  Rest and drink plenty of fluids.  Do not return to work until afebrile for 24 hours and feeling better.

## 2017-05-08 NOTE — Progress Notes (Signed)
   Subjective:    Patient ID: Victoria [name], female    DOB: [birth year], 57 y.o.   MRN: [id number]  HPI Patient here with cough, fever, chills, malaise and fatigue.  Works as an Dance movement psychotherapist.  Had flu exposure last week.  Did take flu vaccine through employment.  Feels like she has the flu.  She is concerned because she is scheduled to have hip surgery March 27.    Review of Systems see above.    She has a history of low white blood cell count that has been evaluated by hematology.  CBC shows white blood cell count of 3200 and hemoglobin 11.7 g.  Platelet count is normal.     Objective:   Physical Exam She looks ill.  Skin warm and dry.  Pharynx is slightly injected.  TMs are clear.  Neck is supple.  Chest is clear to auscultation.  Rapid flu test is positive       Assessment & Plan:  Influenza A  Plan: Tamiflu 75 mg twice daily for 5 days.  Zithromax Z-Pak take 2 tablets day 1 followed by 1 tablet days 2 through 5.  Hycodan 1 teaspoon p.o. every 8 hours as needed cough.  Rest and drink plenty of fluids.  Note to be out of work until afebrile for 24 hours and feeling better.

## 2017-05-10 ENCOUNTER — Ambulatory Visit: Payer: Self-pay | Admitting: Orthopedic Surgery

## 2017-05-10 NOTE — H&P (Signed)
Name Victoria [name], SHIFFMAN (50YD, F) DOB September 07, 1960   Chief Complaint  Left Hip Pain H&P for left hip bearing surface vs revision on 05/22/2017 at [place] [facility] Primary Care Provider: Elby Showers: [address], [place] [address], Ph [phone], Fax [phone] NPI: [id number] Patient's Pharmacies [facility]): [place], [place] [address], Ph [phone], Fax [phone]   Vitals Ht: 5 ft 6.5 in  Wt: 216 lbs  BMI: 34.3 BP: 124/82 sitting L arm  Pulse: 62 bpm    Allergies Reviewed Allergies NKDA   Medications Reviewed Medications acarbose 50 mg tablet 09/18/16   filled surescripts ALPRAZolam 03/15/17   entered Christia Reading ALPRAZolam 0.5 mg tablet 03/29/17   filled MEDIMPACT cyanocobalamin (vit B-12) 1,000 mcg/mL injection solution 02/21/17   filled surescripts cyclobenzaprine 10 mg tablet 03/28/17   filled MEDIMPACT Divigel 03/15/17   entered Christia Reading Divigel 1 mg/gram (0.1 %) transdermal gel packet 03/01/17   filled MEDIMPACT Flexeril 03/15/17   entered [employer] kit 07/02/16   filled surescripts FreeStyle Lite Strips 09/21/16   filled surescripts HYDROcodone 10 mg-acetaminophen 325 mg tablet 03/18/17   filled MEDIMPACT hydrocodone-homatropine 5 mg-1.5 mg/5 mL oral syrup 05/06/17   filled MEDIMPACT levothyroxine 100 mcg tablet 03/27/17   filled MEDIMPACT Linzess 290 mcg capsule TAKE 1 CAPSULE BY MOUTH 30 MINUTES BEFORE THE 1ST MEAL OF THE DAY 04/25/17   filled MEDIMPACT oseltamivir 75 mg capsule 05/06/17   filled MEDIMPACT pantoprazole 40 mg tablet,delayed release 03/01/17   filled MEDIMPACT peg 3350-electrolytes 236 gram-22.74 gram-6.74 gram-5.86 gram solution 03/01/17   filled MEDIMPACT Phenergan 03/15/17   entered Christia Reading promethazine 25 mg tablet 12/28/16   filled surescripts valACYclovir 500 mg tablet 07/31/16    filled surescripts              Family History Reviewed Family History Mother - Arthritis   - Hypertensive disorder Father - Diabetes mellitus   - Hypertensive disorder   - Kidney disease Sister - Diabetes mellitus   - Hypertensive disorder   Social History Reviewed Social History Smoking Status: Never smoker Alcohol intake: None Hand Dominance: Right Work related injury?: N Advance directive: N Medical Power of Attorney: N   Surgical History Reviewed Surgical History Gastric Bypass - 02/26/2014 Hip Joint Replacement - 02/27/2012 Hip Joint Replacement - 02/27/2008 LASIK - 02/27/2004 Hysterectomy - 02/27/2003 Cholecystectomy - 02/26/1985 Caesarian section - 02/27/1983   Past Medical History Reviewed Past Medical History Anemia: Y Joint Pain: Y Osteoarthritis: Y Previous Cortisone Injection(s): Y Thyroid Problems/Goiter: Y   HPI The patient is here today for a pre-operative History and Physical. They are scheduled for left THA bearing surface vs. revision on 05/22/2017 with Dr. Wynelle Link at [facility]. The patient has been seen for her left hip and her low back. Her main complaints have been with her left hip pain. She has had pain in the groin of the left hip and down the anterior thigh. She has had no fevers and no constitutional symptoms. The pain is worse with weightbearing activities and slightly better when she is off it but does not go away completely. 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 X-rays of her hips show the right total hip to be in good position alignment without evidence of loosening or complication. The left total hip which is a metal-on-metal pinnacle shows no loosening or problems with  the acetabular component she does have some slight lateralization of her femoral stem with a stress reaction over the tip of the prosthesis. No overt loosening noted. It is felt that she will require  revision procedure with revision of the bearing surface versus full revision of the prosthesis. Risks and benefits have been discussed and she elects to proceed with surgery at this time  ROS Constitutional: Constitutional: no fever, chills, night sweats, or significant weight loss.  Cardiovascular: Cardiovascular: no palpitations or chest pain.  Respiratory: Respiratory: no cough or shortness of breath and No COPD.  Gastrointestinal: Gastrointestinal: no vomiting or nausea.  Musculoskeletal: Musculoskeletal: Joint Pain.  Neurologic: Neurologic: no numbness, tingling, or difficulty with balancePhysical Exam Patient is a 57 year old female.  General Mental Status - Alert, cooperative and good historian. General Appearance - pleasant, Not in acute distress. Orientation - Oriented X3. Build & Nutrition - Well nourished and Well developed.  Head and Neck Head - normocephalic, atraumatic . Neck Global Assessment - supple, no bruit auscultated on the right, no bruit auscultated on the left.  Eye Wears glasses Pupil - Bilateral - PERR Motion - Bilateral - EOMI.  Chest and Lung Exam Auscultation Breath sounds - clear at anterior chest wall and clear at posterior chest wall. Adventitious sounds - No Adventitious sounds.  Cardiovascular Auscultation Rhythm - Regular rate and rhythm. Heart Sounds - S1 WNL and S2 WNL. Murmurs & Other Heart Sounds - Auscultation of the heart reveals - No Murmurs.  Abdomen Palpation/Percussion Tenderness - Abdomen is non-tender to palpation. Abdomen is soft. Auscultation Auscultation of the abdomen reveals - Bowel sounds normal.  Genitourinary Note: Not done, not pertinent to present illness  Musculoskeletal Flexion on the left hip 210 with discomfort in the anterior groin and down the thigh external rotation of 20 and internal rotation of 10 again with discomfort in the thigh. Right hip was full extension with further flexion to 115 with 25  of external rotation and 15 of internal rotation without pain. X-rays of her hips show the right total hip to be in good position alignment without evidence of loosening or complication. The left total hip which is a metal-on-metal pinnacle shows no loosening or problems with the acetabular component she does have some slight lateralization of her femoral stem with a stress reaction over the tip of the prosthesis. No overt loosening noted.    Assessment / Plan 1. 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 Renold Genta IV TXA Anesthesia Issues: Nausea and vomiting Patient was instructed on what medications to stop prior to surgery. - Follow up visit in 2 weeks with Dr. Wynelle Link - Begin physical therapy following surgery - Pre-operative lab work as pre Pre-Surgical Testing - Prescriptions will be provided in hospital at time of discharge  Return to [place], MD for [place] at [facility] on 06/04/2017 at 03:45 PM  Encounter signed-off by Mickel Crow, PA-C

## 2017-05-10 NOTE — H&P (View-Only) (Signed)
Name Victoria [name], Victoria [name] (73ZH, F)  DOB 01-23-1961  Chief Complaint Failed left total hip H&P for left hip bearing surface vs revision on 05/22/2017 at [place] Team Primary Care Provider: Elby Showers: [address], [place] [address], Ph [phone], Fax [phone] NPI: [id number] Patient's Pharmacies [facility]): [place], [place] [address], Ph [phone], Fax [phone]   Vitals Ht: 5 ft 6.5 in  Wt: 216 lbs  BMI: 34.3  BP: 124/82 sitting L arm    Allergies Reviewed Allergies NKDA   Medications Reviewed Medications acarbose 50 mg tablet 09/18/16   filled surescripts ALPRAZolam 03/15/17   entered Christia Reading ALPRAZolam 0.5 mg tablet 03/29/17   filled MEDIMPACT cyanocobalamin (vit B-12) 1,000 mcg/mL injection solution 02/21/17   filled surescripts cyclobenzaprine 10 mg tablet 03/28/17   filled MEDIMPACT Divigel 03/15/17   entered Christia Reading Divigel 1 mg/gram (0.1 %) transdermal gel packet 03/01/17   filled MEDIMPACT Flexeril 03/15/17   entered [employer] kit 07/02/16   filled surescripts FreeStyle Lite Strips 09/21/16   filled surescripts HYDROcodone 10 mg-acetaminophen 325 mg tablet 03/18/17   filled MEDIMPACT hydrocodone-homatropine 5 mg-1.5 mg/5 mL oral syrup 05/06/17   filled MEDIMPACT levothyroxine 100 mcg tablet 03/27/17   filled MEDIMPACT Linzess 290 mcg capsule TAKE 1 CAPSULE BY MOUTH 30 MINUTES BEFORE THE 1ST MEAL OF THE DAY 04/25/17   filled MEDIMPACT oseltamivir 75 mg capsule 05/06/17   filled MEDIMPACT pantoprazole 40 mg tablet,delayed release 03/01/17   filled MEDIMPACT peg 3350-electrolytes 236 gram-22.74 gram-6.74 gram-5.86 gram solution 03/01/17   filled MEDIMPACT Phenergan 03/15/17   entered Christia Reading promethazine 25 mg tablet 12/28/16   filled surescripts valACYclovir 500 mg tablet 07/31/16    filled surescripts             Problems Reviewed Problems Pain in left knee -   Family History Reviewed Family History Mother - Arthritis   - Hypertensive disorder Father - Diabetes mellitus   - Hypertensive disorder   - Kidney disease Sister - Diabetes mellitus   - Hypertensive disorder   Social History Reviewed Social History Smoking Status: Never smoker Alcohol intake: None Hand Dominance: Right Work related injury?: N Advance directive: N Medical Power of Attorney: N   Surgical History Reviewed Surgical History Gastric Bypass - 02/26/2014 Hip Joint Replacement - 02/27/2012 Hip Joint Replacement - 02/27/2008 LASIK - 02/27/2004 Hysterectomy - 02/27/2003 Cholecystectomy - 02/26/1985 Caesarian section - 02/27/1983   Past Medical History Reviewed Past Medical History Anemia: Y Joint Pain: Y Osteoarthritis: Y Previous Cortisone Injection(s): Y Thyroid Problems/Goiter: Y    HPI The patient is here today for a pre-operative History and Physical. They are scheduled for left THA bearing surface vs. revision on 05/22/2017 with Dr. Wynelle Link at [facility]. The patient has been seen for her left hip and her low back. Her main complaints have been with her left hip pain. She has had pain in the groin of the left hip and down the anterior thigh. She has had no fevers and no constitutional symptoms. The pain is worse with weightbearing activities and slightly better when she is off it but does not go away completely. 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 X-rays of her hips show the right total hip to be in good position alignment without evidence of loosening or complication. The left total hip which is a metal-on-metal  pinnacle shows no loosening or problems with the acetabular component she does have some slight lateralization of her femoral stem with a stress reaction over the tip of the prosthesis. No overt  loosening noted. It is felt that she will require revision procedure with revision of the bearing surface versus full revision of the prosthesis. Risks and benefits have been discussed and she elects to proceed with surgery at this time.   ROS Constitutional: Constitutional: no fever, chills, night sweats, or significant weight loss.  Cardiovascular: Cardiovascular: no palpitations or chest pain.  Respiratory: Respiratory: no cough or shortness of breath and No COPD.  Gastrointestinal: Gastrointestinal: no vomiting or nausea.  Musculoskeletal: Musculoskeletal: Joint Pain.  Neurologic: Neurologic: no numbness, tingling, or difficulty with balance.   Physical Exam Patient is a 57 year old female.  General Mental Status - Alert, cooperative and good historian. General Appearance - pleasant, Not in acute distress. Orientation - Oriented X3. Build & Nutrition - Well nourished and Well developed.  Head and Neck Head - normocephalic, atraumatic . Neck Global Assessment - supple, no bruit auscultated on the right, no bruit auscultated on the left.  Eye Wears glasses Pupil - Bilateral - PERR Motion - Bilateral - EOMI.  Chest and Lung Exam Auscultation Breath sounds - clear at anterior chest wall and clear at posterior chest wall. Adventitious sounds - No Adventitious sounds.  Cardiovascular Auscultation Rhythm - Regular rate and rhythm. Heart Sounds - S1 WNL and S2 WNL. Murmurs & Other Heart Sounds - Auscultation of the heart reveals - No Murmurs.  Abdomen Palpation/Percussion Tenderness - Abdomen is non-tender to palpation. Abdomen is soft. Auscultation Auscultation of the abdomen reveals - Bowel sounds normal.  Genitourinary Note: Not done, not pertinent to present illness  Musculoskeletal Flexion on the left hip 210 with discomfort in the anterior groin and down the thigh external rotation of 20 and internal rotation of 10 again with discomfort in the thigh. Right  hip was full extension with further flexion to 115 with 25 of external rotation and 15 of internal rotation without pain. X-rays of her hips show the right total hip to be in good position alignment without evidence of loosening or complication. The left total hip which is a metal-on-metal pinnacle shows no loosening or problems with the acetabular component she does have some slight lateralization of her femoral stem with a stress reaction over the tip of the prosthesis. No overt loosening noted.   Assessment / Plan 1. 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 Renold Genta IV TXA Anesthesia Issues: Nausea and vomiting Patient was instructed on what medications to stop prior to surgery. - Follow up visit in 2 weeks with Dr. Wynelle Link - Begin physical therapy following surgery - Pre-operative lab work as pre Pre-Surgical Testing - Prescriptions will be provided in hospital at time of discharge  Return to [place], MD for [place] at [facility] on 06/04/2017 at 03:45 PM  Encounter signed-off by Mickel Crow, PA-C

## 2017-05-10 NOTE — H&P (Signed)
Name Victoria [name], Victoria [name] ([id number], F)  DOB 03-27-1960  Chief Complaint Failed left total hip H&P for left hip bearing surface vs revision on 05/22/2017 at [place] Team Primary Care Provider: Elby Showers: [address] [place] [address], Ph [phone], Fax [phone] NPI: [id number] Patient's Pharmacies [facility]): [place], [place] [address], Ph [phone], Fax [phone]   Vitals Ht: 5 ft 6.5 in  Wt: 216 lbs  BMI: 34.3  BP: 124/82 sitting L arm    Allergies Reviewed Allergies NKDA   Medications Reviewed Medications acarbose 50 mg tablet 09/18/16   filled surescripts ALPRAZolam 03/15/17   entered Christia Reading ALPRAZolam 0.5 mg tablet 03/29/17   filled MEDIMPACT cyanocobalamin (vit B-12) 1,000 mcg/mL injection solution 02/21/17   filled surescripts cyclobenzaprine 10 mg tablet 03/28/17   filled MEDIMPACT Divigel 03/15/17   entered Christia Reading Divigel 1 mg/gram (0.1 %) transdermal gel packet 03/01/17   filled MEDIMPACT Flexeril 03/15/17   entered [employer] kit 07/02/16   filled surescripts FreeStyle Lite Strips 09/21/16   filled surescripts HYDROcodone 10 mg-acetaminophen 325 mg tablet 03/18/17   filled MEDIMPACT hydrocodone-homatropine 5 mg-1.5 mg/5 mL oral syrup 05/06/17   filled MEDIMPACT levothyroxine 100 mcg tablet 03/27/17   filled MEDIMPACT Linzess 290 mcg capsule TAKE 1 CAPSULE BY MOUTH 30 MINUTES BEFORE THE 1ST MEAL OF THE DAY 04/25/17   filled MEDIMPACT oseltamivir 75 mg capsule 05/06/17   filled MEDIMPACT pantoprazole 40 mg tablet,delayed release 03/01/17   filled MEDIMPACT peg 3350-electrolytes 236 gram-22.74 gram-6.74 gram-5.86 gram solution 03/01/17   filled MEDIMPACT Phenergan 03/15/17   entered Christia Reading promethazine 25 mg tablet 12/28/16   filled surescripts valACYclovir 500 mg tablet 07/31/16    filled surescripts             Problems Reviewed Problems Pain in left knee -   Family History Reviewed Family History Mother - Arthritis   - Hypertensive disorder Father - Diabetes mellitus   - Hypertensive disorder   - Kidney disease Sister - Diabetes mellitus   - Hypertensive disorder   Social History Reviewed Social History Smoking Status: Never smoker Alcohol intake: None Hand Dominance: Right Work related injury?: N Advance directive: N Medical Power of Attorney: N   Surgical History Reviewed Surgical History Gastric Bypass - 02/26/2014 Hip Joint Replacement - 02/27/2012 Hip Joint Replacement - 02/27/2008 LASIK - 02/27/2004 Hysterectomy - 02/27/2003 Cholecystectomy - 02/26/1985 Caesarian section - 02/27/1983   Past Medical History Reviewed Past Medical History Anemia: Y Joint Pain: Y Osteoarthritis: Y Previous Cortisone Injection(s): Y Thyroid Problems/Goiter: Y    HPI The patient is here today for a pre-operative History and Physical. They are scheduled for left THA bearing surface vs. revision on 05/22/2017 with Dr. Wynelle Link at [facility]. The patient has been seen for her left hip and her low back. Her main complaints have been with her left hip pain. She has had pain in the groin of the left hip and down the anterior thigh. She has had no fevers and no constitutional symptoms. The pain is worse with weightbearing activities and slightly better when she is off it but does not go away completely. 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 X-rays of her hips show the right total hip to be in good position alignment without evidence of loosening or complication. The left total hip which is a metal-on-metal  pinnacle shows no loosening or problems with the acetabular component she does have some slight lateralization of her femoral stem with a stress reaction over the tip of the prosthesis. No overt  loosening noted. It is felt that she will require revision procedure with revision of the bearing surface versus full revision of the prosthesis. Risks and benefits have been discussed and she elects to proceed with surgery at this time.   ROS Constitutional: Constitutional: no fever, chills, night sweats, or significant weight loss.  Cardiovascular: Cardiovascular: no palpitations or chest pain.  Respiratory: Respiratory: no cough or shortness of breath and No COPD.  Gastrointestinal: Gastrointestinal: no vomiting or nausea.  Musculoskeletal: Musculoskeletal: Joint Pain.  Neurologic: Neurologic: no numbness, tingling, or difficulty with balance.   Physical Exam Patient is a 57 year old female.  General Mental Status - Alert, cooperative and good historian. General Appearance - pleasant, Not in acute distress. Orientation - Oriented X3. Build & Nutrition - Well nourished and Well developed.  Head and Neck Head - normocephalic, atraumatic . Neck Global Assessment - supple, no bruit auscultated on the right, no bruit auscultated on the left.  Eye Wears glasses Pupil - Bilateral - PERR Motion - Bilateral - EOMI.  Chest and Lung Exam Auscultation Breath sounds - clear at anterior chest wall and clear at posterior chest wall. Adventitious sounds - No Adventitious sounds.  Cardiovascular Auscultation Rhythm - Regular rate and rhythm. Heart Sounds - S1 WNL and S2 WNL. Murmurs & Other Heart Sounds - Auscultation of the heart reveals - No Murmurs.  Abdomen Palpation/Percussion Tenderness - Abdomen is non-tender to palpation. Abdomen is soft. Auscultation Auscultation of the abdomen reveals - Bowel sounds normal.  Genitourinary Note: Not done, not pertinent to present illness  Musculoskeletal Flexion on the left hip 210 with discomfort in the anterior groin and down the thigh external rotation of 20 and internal rotation of 10 again with discomfort in the thigh. Right  hip was full extension with further flexion to 115 with 25 of external rotation and 15 of internal rotation without pain. X-rays of her hips show the right total hip to be in good position alignment without evidence of loosening or complication. The left total hip which is a metal-on-metal pinnacle shows no loosening or problems with the acetabular component she does have some slight lateralization of her femoral stem with a stress reaction over the tip of the prosthesis. No overt loosening noted.   Assessment / Plan 1. 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 Renold Genta IV TXA Anesthesia Issues: Nausea and vomiting Patient was instructed on what medications to stop prior to surgery. - Follow up visit in 2 weeks with Dr. Wynelle Link - Begin physical therapy following surgery - Pre-operative lab work as pre Pre-Surgical Testing - Prescriptions will be provided in hospital at time of discharge  Return to [place], MD for [place] at [facility] on 06/04/2017 at 03:45 PM  Encounter signed-off by Mickel Crow, PA-C

## 2017-05-15 ENCOUNTER — Encounter (HOSPITAL_COMMUNITY): Payer: Self-pay

## 2017-05-15 ENCOUNTER — Other Ambulatory Visit (HOSPITAL_COMMUNITY): Payer: Self-pay | Admitting: Emergency Medicine

## 2017-05-15 NOTE — Progress Notes (Signed)
LOV DR BAXLEY 05-06-17 Epic  EKG 04-02-16 Epic  ECHO 05-03-16 Epic  STRESS TEST 05-03-16 Epic

## 2017-05-15 NOTE — Patient Instructions (Addendum)
Victoria [name]  05/15/2017   Your procedure is scheduled on: 05-22-17   Report to [facility] Main  Entrance    Report to admitting at 11:45AM   Call this number if you have problems the morning of surgery [phone]    Remember: Do not eat food :After Midnight. YOU MAY HAVE CLEAR LIQUIDS FROM MIDNIGHT UNTIL 8:15AM DAY OF SURGERY. NOTHING BY MOUTH  AFTER 8:15AM!     Take these medicines the morning of surgery with A SIP OF WATER: HYDROCODONE IF NEEDED, LEVOTHYROXINE, PANTOPRAZOLE                                You may not have any metal on your body including hair pins and              piercings  Do not wear jewelry, make-up, lotions, powders or perfumes, deodorant             Do not wear nail polish.  Do not shave  48 hours prior to surgery.          Do not bring valuables to the hospital. [place].  Contacts, dentures or bridgework may not be worn into surgery.  Leave suitcase in the car. After surgery it may be brought to your room.                 Please read over the following fact sheets you were given: _____________________________________________________________________    CLEAR LIQUID DIET   Foods Allowed                                                                     Foods Excluded  Coffee and tea, regular and decaf                             liquids that you cannot  Plain Jell-O in any flavor                                             see through such as: Fruit ices (not with fruit pulp)                                     milk, soups, orange juice  Iced Popsicles                                    All solid food Carbonated beverages, regular and diet                                    Cranberry, grape and apple juices Sports  drinks like Gatorade Lightly seasoned clear broth or consume(fat free) Sugar, honey syrup  Sample Menu Breakfast                                Lunch                                      Supper Cranberry juice                    Beef broth                            Chicken broth Jell-O                                     Grape juice                           Apple juice Coffee or tea                        Jell-O                                      Popsicle                                                Coffee or tea                        Coffee or tea  _____________________________________________________________________  [facility] - Preparing for Surgery Before surgery, you can play an important role.  Because skin is not sterile, your skin needs to be as free of germs as possible.  You can reduce the number of germs on your skin by washing with CHG (chlorahexidine gluconate) soap before surgery.  CHG is an antiseptic cleaner which kills germs and bonds with the skin to continue killing germs even after washing. Please DO NOT use if you have an allergy to CHG or antibacterial soaps.  If your skin becomes reddened/irritated stop using the CHG and inform your nurse when you arrive at Short Stay. Do not shave (including legs and underarms) for at least 48 hours prior to the first CHG shower.  You may shave your face/neck. Please follow these instructions carefully:  1.  Shower with CHG Soap the night before surgery and the  morning of Surgery.  2.  If you choose to wash your hair, wash your hair first as usual with your  normal  shampoo.  3.  After you shampoo, rinse your hair and body thoroughly to remove the  shampoo.                           4.  Use CHG as you would any other liquid soap.  You can apply chg directly  to the skin and wash  Gently with a scrungie or clean washcloth.  5.  Apply the CHG Soap to your body ONLY FROM THE NECK DOWN.   Do not use on face/ open                           Wound or open sores. Avoid contact with eyes, ears mouth and genitals (private parts).                       Wash face,  Genitals  (private parts) with your normal soap.             6.  Wash thoroughly, paying special attention to the area where your surgery  will be performed.  7.  Thoroughly rinse your body with warm water from the neck down.  8.  DO NOT shower/wash with your normal soap after using and rinsing off  the CHG Soap.                9.  Pat yourself dry with a clean towel.            10.  Wear clean pajamas.            11.  Place clean sheets on your bed the night of your first shower and do not  sleep with pets. Day of Surgery : Do not apply any lotions/deodorants the morning of surgery.  Please wear clean clothes to the hospital/surgery center.  FAILURE TO FOLLOW THESE INSTRUCTIONS MAY RESULT IN THE CANCELLATION OF YOUR SURGERY PATIENT SIGNATURE_________________________________  NURSE SIGNATURE__________________________________  ________________________________________________________________________   Adam Phenix  An incentive spirometer is a tool that can help keep your lungs clear and active. This tool measures how well you are filling your lungs with each breath. Taking long deep breaths may help reverse or decrease the chance of developing breathing (pulmonary) problems (especially infection) following:  A long period of time when you are unable to move or be active. BEFORE THE PROCEDURE   If the spirometer includes an indicator to show your best effort, your nurse or respiratory therapist will set it to a desired goal.  If possible, sit up straight or lean slightly forward. Try not to slouch.  Hold the incentive spirometer in an upright position. INSTRUCTIONS FOR USE  1. Sit on the edge of your bed if possible, or sit up as far as you can in bed or on a chair. 2. Hold the incentive spirometer in an upright position. 3. Breathe out normally. 4. Place the mouthpiece in your mouth and seal your lips tightly around it. 5. Breathe in slowly and as deeply as possible, raising the  piston or the ball toward the top of the column. 6. Hold your breath for 3-5 seconds or for as long as possible. Allow the piston or ball to fall to the bottom of the column. 7. Remove the mouthpiece from your mouth and breathe out normally. 8. Rest for a few seconds and repeat Steps 1 through 7 at least 10 times every 1-2 hours when you are awake. Take your time and take a few normal breaths between deep breaths. 9. The spirometer may include an indicator to show your best effort. Use the indicator as a goal to work toward during each repetition. 10. After each set of 10 deep breaths, practice coughing to be sure your lungs are clear. If you have an incision (the cut made at the time of  surgery), support your incision when coughing by placing a pillow or rolled up towels firmly against it. Once you are able to get out of bed, walk around indoors and cough well. You may stop using the incentive spirometer when instructed by your caregiver.  RISKS AND COMPLICATIONS  Take your time so you do not get dizzy or light-headed.  If you are in pain, you may need to take or ask for pain medication before doing incentive spirometry. It is harder to take a deep breath if you are having pain. AFTER USE  Rest and breathe slowly and easily.  It can be helpful to keep track of a log of your progress. Your caregiver can provide you with a simple table to help with this. If you are using the spirometer at home, follow these instructions: [place] IF:   You are having difficultly using the spirometer.  You have trouble using the spirometer as often as instructed.  Your pain medication is not giving enough relief while using the spirometer.  You develop fever of 100.5 F (38.1 C) or higher. SEEK IMMEDIATE MEDICAL CARE IF:   You cough up bloody sputum that had not been present before.  You develop fever of 102 F (38.9 C) or greater.  You develop worsening pain at or near the incision  site. MAKE SURE YOU:   Understand these instructions.  Will watch your condition.  Will get help right away if you are not doing well or get worse. Document Released: 06/25/2006 Document Revised: 05/07/2011 Document Reviewed: 08/26/2006 Exit[employer] Patient Information 2014 Exit[employer], [place].   ________________________________________________________________________  WHAT IS A BLOOD TRANSFUSION? Blood Transfusion Information  A transfusion is the replacement of blood or some of its parts. Blood is made up of multiple cells which provide different functions.  Red blood cells carry oxygen and are used for blood loss replacement.  White blood cells fight against infection.  Platelets control bleeding.  Plasma helps clot blood.  Other blood products are available for specialized needs, such as hemophilia or other clotting disorders. BEFORE THE TRANSFUSION  Who gives blood for transfusions?   Healthy volunteers who are fully evaluated to make sure their blood is safe. This is blood bank blood. Transfusion therapy is the safest it has ever been in the practice of medicine. Before blood is taken from a donor, a complete history is taken to make sure that person has no history of diseases nor engages in risky social behavior (examples are intravenous drug use or sexual activity with multiple partners). The donor's travel history is screened to minimize risk of transmitting infections, such as malaria. The donated blood is tested for signs of infectious diseases, such as HIV and hepatitis. The blood is then tested to be sure it is compatible with you in order to minimize the chance of a transfusion reaction. If you or a relative donates blood, this is often done in anticipation of surgery and is not appropriate for emergency situations. It takes many days to process the donated blood. RISKS AND COMPLICATIONS Although transfusion therapy is very safe and saves many lives, the main dangers of  transfusion include:   Getting an infectious disease.  Developing a transfusion reaction. This is an allergic reaction to something in the blood you were given. Every precaution is taken to prevent this. The decision to have a blood transfusion has been considered carefully by your caregiver before blood is given. Blood is not given unless the benefits outweigh the risks. AFTER THE  TRANSFUSION  Right after receiving a blood transfusion, you will usually feel much better and more energetic. This is especially true if your red blood cells have gotten low (anemic). The transfusion raises the level of the red blood cells which carry oxygen, and this usually causes an energy increase.  The nurse administering the transfusion will monitor you carefully for complications. HOME CARE INSTRUCTIONS  No special instructions are needed after a transfusion. You may find your energy is better. Speak with your caregiver about any limitations on activity for underlying diseases you may have. SEEK MEDICAL CARE IF:   Your condition is not improving after your transfusion.  You develop redness or irritation at the intravenous (IV) site. SEEK IMMEDIATE MEDICAL CARE IF:  Any of the following symptoms occur over the next 12 hours:  Shaking chills.  You have a temperature by mouth above 102 F (38.9 C), not controlled by medicine.  Chest, back, or muscle pain.  People around you feel you are not acting correctly or are confused.  Shortness of breath or difficulty breathing.  Dizziness and fainting.  You get a rash or develop hives.  You have a decrease in urine output.  Your urine turns a dark color or changes to pink, red, or [place]. Any of the following symptoms occur over the next 10 days:  You have a temperature by mouth above 102 F (38.9 C), not controlled by medicine.  Shortness of breath.  Weakness after normal activity.  The white part of the eye turns yellow (jaundice).  You have a  decrease in the amount of urine or are urinating less often.  Your urine turns a dark color or changes to pink, red, or [place]. Document Released: 02/10/2000 Document Revised: 05/07/2011 Document Reviewed: 09/29/2007 [facility] Patient Information 2014 [place], [place].  _______________________________________________________________________

## 2017-05-16 ENCOUNTER — Other Ambulatory Visit: Payer: Self-pay

## 2017-05-16 ENCOUNTER — Encounter (HOSPITAL_COMMUNITY)
Admission: RE | Admit: 2017-05-16 | Discharge: 2017-05-16 | Disposition: A | Discharge status: Home / Self Care | Payer: No Typology Code available for payment source | Source: Ambulatory Visit | Attending: Orthopedic Surgery | Admitting: Orthopedic Surgery

## 2017-05-16 ENCOUNTER — Ambulatory Visit
Admission: RE | Admit: 2017-05-16 | Discharge: 2017-05-16 | Disposition: A | Discharge status: Home / Self Care | Payer: No Typology Code available for payment source | Source: Ambulatory Visit | Attending: Internal Medicine | Admitting: Internal Medicine

## 2017-05-16 ENCOUNTER — Encounter (HOSPITAL_COMMUNITY): Payer: Self-pay

## 2017-05-16 DIAGNOSIS — T84091A Other mechanical complication of internal left hip prosthesis, initial encounter: Secondary | ICD-10-CM | POA: Diagnosis not present

## 2017-05-16 DIAGNOSIS — R42 Dizziness and giddiness: Secondary | ICD-10-CM

## 2017-05-16 DIAGNOSIS — R51 Headache: Principal | ICD-10-CM

## 2017-05-16 DIAGNOSIS — Z01812 Encounter for preprocedural laboratory examination: Secondary | ICD-10-CM | POA: Insufficient documentation

## 2017-05-16 DIAGNOSIS — R519 Headache, unspecified: Secondary | ICD-10-CM

## 2017-05-16 HISTORY — DX: Palpitations: R00.2

## 2017-05-16 LAB — COMPREHENSIVE METABOLIC PANEL
ALT: 21 U/L (ref 14–54)
AST: 21 U/L (ref 15–41)
Albumin: 3.6 g/dL (ref 3.5–5.0)
Alkaline Phosphatase: 55 U/L (ref 38–126)
Anion gap: 10 (ref 5–15)
BUN: 15 mg/dL (ref 6–20)
CO2: 27 mmol/L (ref 22–32)
Calcium: 9 mg/dL (ref 8.9–10.3)
Chloride: 105 mmol/L (ref 101–111)
Creatinine, Ser: 0.91 mg/dL (ref 0.44–1.00)
GFR calc Af Amer: >60 mL/min (ref >60)
GFR calc non Af Amer: >60 mL/min (ref >60)
Glucose, Bld: 85 mg/dL (ref 65–99)
Potassium: 4.3 mmol/L (ref 3.5–5.1)
Sodium: 142 mmol/L (ref 135–145)
Total Bilirubin: 0.4 mg/dL (ref 0.3–1.2)
Total Protein: 7.4 g/dL (ref 6.5–8.1)

## 2017-05-16 LAB — APTT: aPTT: 32 seconds (ref 24–36)

## 2017-05-16 LAB — SURGICAL PCR SCREEN
MRSA, PCR: NEGATIVE
Staphylococcus aureus: NEGATIVE

## 2017-05-16 LAB — PROTIME-INR
INR: 1
Prothrombin Time: 13.1 seconds (ref 11.4–15.2)

## 2017-05-16 MED ORDER — GADOBENATE DIMEGLUMINE 529 MG/ML IV SOLN
20.0000 mL | Freq: Once | INTRAVENOUS | Status: AC | PRN
  Administered 2017-05-16: 20 mL via INTRAVENOUS

## 2017-05-21 ENCOUNTER — Ambulatory Visit: Payer: Self-pay

## 2017-05-21 MED ORDER — TRANEXAMIC ACID 1000 MG/10ML IV SOLN
1000.0000 mg | INTRAVENOUS | Status: AC
  Administered 2017-05-22: 1000 mg via INTRAVENOUS
  Filled 2017-05-21: qty 1100

## 2017-05-22 ENCOUNTER — Encounter (HOSPITAL_COMMUNITY): Payer: Self-pay | Admitting: Anesthesiology

## 2017-05-22 ENCOUNTER — Encounter (HOSPITAL_COMMUNITY)
Admission: RE | Disposition: A | Discharge status: Home / Self Care | Payer: Self-pay | Source: Ambulatory Visit | Attending: Orthopedic Surgery

## 2017-05-22 ENCOUNTER — Inpatient Hospital Stay (HOSPITAL_COMMUNITY): Payer: No Typology Code available for payment source | Admitting: Anesthesiology

## 2017-05-22 ENCOUNTER — Inpatient Hospital Stay (HOSPITAL_COMMUNITY): Payer: No Typology Code available for payment source

## 2017-05-22 ENCOUNTER — Other Ambulatory Visit: Payer: Self-pay

## 2017-05-22 ENCOUNTER — Inpatient Hospital Stay (HOSPITAL_COMMUNITY)
Admission: RE | Admit: 2017-05-22 | Discharge: 2017-05-23 | DRG: 468 | Disposition: A | Discharge status: Home / Self Care | Payer: No Typology Code available for payment source | Source: Ambulatory Visit | Attending: Orthopedic Surgery | Admitting: Orthopedic Surgery

## 2017-05-22 DIAGNOSIS — Z96649 Presence of unspecified artificial hip joint: Secondary | ICD-10-CM

## 2017-05-22 DIAGNOSIS — K911 Postgastric surgery syndromes: Secondary | ICD-10-CM | POA: Diagnosis present

## 2017-05-22 DIAGNOSIS — Y792 Prosthetic and other implants, materials and accessory orthopedic devices associated with adverse incidents: Secondary | ICD-10-CM | POA: Diagnosis present

## 2017-05-22 DIAGNOSIS — D72819 Decreased white blood cell count, unspecified: Secondary | ICD-10-CM | POA: Diagnosis present

## 2017-05-22 DIAGNOSIS — Z6834 Body mass index (BMI) 34.0-34.9, adult: Secondary | ICD-10-CM | POA: Diagnosis not present

## 2017-05-22 DIAGNOSIS — Z96643 Presence of artificial hip joint, bilateral: Secondary | ICD-10-CM | POA: Diagnosis present

## 2017-05-22 DIAGNOSIS — Z79899 Other long term (current) drug therapy: Secondary | ICD-10-CM

## 2017-05-22 DIAGNOSIS — E890 Postprocedural hypothyroidism: Secondary | ICD-10-CM | POA: Diagnosis present

## 2017-05-22 DIAGNOSIS — E669 Obesity, unspecified: Secondary | ICD-10-CM | POA: Diagnosis present

## 2017-05-22 DIAGNOSIS — K219 Gastro-esophageal reflux disease without esophagitis: Secondary | ICD-10-CM | POA: Diagnosis present

## 2017-05-22 DIAGNOSIS — Z86010 Personal history of colonic polyps: Secondary | ICD-10-CM | POA: Diagnosis not present

## 2017-05-22 DIAGNOSIS — D649 Anemia, unspecified: Secondary | ICD-10-CM | POA: Diagnosis present

## 2017-05-22 DIAGNOSIS — T84018S Broken internal joint prosthesis, other site, sequela: Secondary | ICD-10-CM

## 2017-05-22 DIAGNOSIS — T84091A Other mechanical complication of internal left hip prosthesis, initial encounter: Secondary | ICD-10-CM | POA: Diagnosis present

## 2017-05-22 DIAGNOSIS — T84018A Broken internal joint prosthesis, other site, initial encounter: Secondary | ICD-10-CM

## 2017-05-22 HISTORY — PX: FINE NEEDLE ASPIRATION: SHX6590

## 2017-05-22 HISTORY — PX: TOTAL HIP REVISION: SHX763

## 2017-05-22 LAB — TYPE AND SCREEN
ABO/RH(D): A POS
Antibody Screen: NEGATIVE

## 2017-05-22 SURGERY — TOTAL HIP REVISION
Anesthesia: General | Site: Knee | Laterality: Left

## 2017-05-22 MED ORDER — HYDROMORPHONE HCL 1 MG/ML IJ SOLN
0.2500 mg | INTRAMUSCULAR | Status: DC | PRN
  Administered 2017-05-22: 0.5 mg via INTRAVENOUS
  Administered 2017-05-22: 0.25 mg via INTRAVENOUS
  Administered 2017-05-22 (×2): 0.5 mg via INTRAVENOUS
  Administered 2017-05-22: 0.25 mg via INTRAVENOUS

## 2017-05-22 MED ORDER — CHLORHEXIDINE GLUCONATE 4 % EX LIQD: 60.0000 mL | Freq: Once | CUTANEOUS | Status: DC

## 2017-05-22 MED ORDER — ONDANSETRON HCL 4 MG/2ML IJ SOLN: 4.0000 mg | Freq: Four times a day (QID) | INTRAMUSCULAR | Status: DC | PRN

## 2017-05-22 MED ORDER — LIDOCAINE 2% (20 MG/ML) 5 ML SYRINGE
INTRAMUSCULAR | Status: DC | PRN
  Administered 2017-05-22: 80 mg via INTRAVENOUS

## 2017-05-22 MED ORDER — ROCURONIUM BROMIDE 10 MG/ML (PF) SYRINGE
PREFILLED_SYRINGE | INTRAVENOUS | Status: AC
  Filled 2017-05-22: qty 5

## 2017-05-22 MED ORDER — ACETAMINOPHEN 10 MG/ML IV SOLN
1000.0000 mg | Freq: Once | INTRAVENOUS | Status: AC
  Administered 2017-05-22: 1000 mg via INTRAVENOUS
  Filled 2017-05-22: qty 100

## 2017-05-22 MED ORDER — DEXAMETHASONE SODIUM PHOSPHATE 10 MG/ML IJ SOLN
10.0000 mg | Freq: Once | INTRAMUSCULAR | Status: AC
  Administered 2017-05-23: 10 mg via INTRAVENOUS
  Filled 2017-05-22: qty 1

## 2017-05-22 MED ORDER — METOCLOPRAMIDE HCL 5 MG/ML IJ SOLN: 10.0000 mg | Freq: Once | INTRAMUSCULAR | Status: DC | PRN

## 2017-05-22 MED ORDER — CEFAZOLIN SODIUM-DEXTROSE 2-4 GM/100ML-% IV SOLN
2.0000 g | INTRAVENOUS | Status: AC
  Administered 2017-05-22: 2 g via INTRAVENOUS
  Filled 2017-05-22: qty 100

## 2017-05-22 MED ORDER — LACTATED RINGERS IV SOLN
INTRAVENOUS | Status: DC
  Administered 2017-05-22 (×2): via INTRAVENOUS

## 2017-05-22 MED ORDER — METHOCARBAMOL 500 MG PO TABS
500.0000 mg | ORAL_TABLET | Freq: Four times a day (QID) | ORAL | Status: DC | PRN
  Administered 2017-05-23: 500 mg via ORAL
  Filled 2017-05-22: qty 1

## 2017-05-22 MED ORDER — ONDANSETRON HCL 4 MG/2ML IJ SOLN
INTRAMUSCULAR | Status: AC
  Filled 2017-05-22: qty 2

## 2017-05-22 MED ORDER — MENTHOL 3 MG MT LOZG: 1.0000 | LOZENGE | OROMUCOSAL | Status: DC | PRN

## 2017-05-22 MED ORDER — BUPIVACAINE HCL 0.25 % IJ SOLN
INTRAMUSCULAR | Status: DC | PRN
  Administered 2017-05-22: 30 mL

## 2017-05-22 MED ORDER — PROMETHAZINE HCL 25 MG PO TABS: 25.0000 mg | ORAL_TABLET | Freq: Three times a day (TID) | ORAL | Status: DC | PRN

## 2017-05-22 MED ORDER — MIDAZOLAM HCL 5 MG/5ML IJ SOLN
INTRAMUSCULAR | Status: DC | PRN
  Administered 2017-05-22: 2 mg via INTRAVENOUS

## 2017-05-22 MED ORDER — LINACLOTIDE 145 MCG PO CAPS
290.0000 ug | ORAL_CAPSULE | Freq: Every day | ORAL | Status: DC
  Administered 2017-05-23: 290 ug via ORAL
  Filled 2017-05-22: qty 2

## 2017-05-22 MED ORDER — ASPIRIN EC 325 MG PO TBEC
325.0000 mg | DELAYED_RELEASE_TABLET | Freq: Every day | ORAL | Status: DC
  Filled 2017-05-22: qty 1

## 2017-05-22 MED ORDER — ACETAMINOPHEN 325 MG PO TABS: 325.0000 mg | ORAL_TABLET | Freq: Four times a day (QID) | ORAL | Status: DC | PRN

## 2017-05-22 MED ORDER — DOCUSATE SODIUM 100 MG PO CAPS
100.0000 mg | ORAL_CAPSULE | Freq: Two times a day (BID) | ORAL | Status: DC
  Administered 2017-05-22 – 2017-05-23 (×2): 100 mg via ORAL
  Filled 2017-05-22 (×2): qty 1

## 2017-05-22 MED ORDER — MIDAZOLAM HCL 2 MG/2ML IJ SOLN
INTRAMUSCULAR | Status: AC
  Filled 2017-05-22: qty 2

## 2017-05-22 MED ORDER — TRAMADOL HCL 50 MG PO TABS: 50.0000 mg | ORAL_TABLET | Freq: Four times a day (QID) | ORAL | Status: DC | PRN

## 2017-05-22 MED ORDER — HYDROMORPHONE HCL 1 MG/ML IJ SOLN
0.5000 mg | INTRAMUSCULAR | Status: DC | PRN
  Administered 2017-05-22: 1 mg via INTRAVENOUS
  Filled 2017-05-22: qty 1

## 2017-05-22 MED ORDER — FENTANYL CITRATE (PF) 100 MCG/2ML IJ SOLN
INTRAMUSCULAR | Status: AC
  Filled 2017-05-22: qty 2

## 2017-05-22 MED ORDER — CEFAZOLIN SODIUM-DEXTROSE 2-4 GM/100ML-% IV SOLN
2.0000 g | Freq: Four times a day (QID) | INTRAVENOUS | Status: AC
  Administered 2017-05-22 – 2017-05-23 (×2): 2 g via INTRAVENOUS
  Filled 2017-05-22 (×2): qty 100

## 2017-05-22 MED ORDER — DEXTROSE 5 % IV SOLN
500.0000 mg | Freq: Four times a day (QID) | INTRAVENOUS | Status: DC | PRN
Start: 2017-05-22 — End: 2017-05-23
  Administered 2017-05-22: 500 mg via INTRAVENOUS
  Filled 2017-05-22: qty 550

## 2017-05-22 MED ORDER — ALPRAZOLAM 0.5 MG PO TABS: 0.5000 mg | ORAL_TABLET | Freq: Every evening | ORAL | Status: DC | PRN

## 2017-05-22 MED ORDER — METOCLOPRAMIDE HCL 5 MG/ML IJ SOLN: 5.0000 mg | Freq: Three times a day (TID) | INTRAMUSCULAR | Status: DC | PRN

## 2017-05-22 MED ORDER — PHENOL 1.4 % MT LIQD: 1.0000 | OROMUCOSAL | Status: DC | PRN

## 2017-05-22 MED ORDER — DIPHENHYDRAMINE HCL 12.5 MG/5ML PO ELIX: 12.5000 mg | ORAL_SOLUTION | ORAL | Status: DC | PRN

## 2017-05-22 MED ORDER — TRANEXAMIC ACID 1000 MG/10ML IV SOLN
1000.0000 mg | Freq: Once | INTRAVENOUS | Status: AC
  Administered 2017-05-22: 1000 mg via INTRAVENOUS
  Filled 2017-05-22: qty 1100

## 2017-05-22 MED ORDER — METOCLOPRAMIDE HCL 5 MG PO TABS: 5.0000 mg | ORAL_TABLET | Freq: Three times a day (TID) | ORAL | Status: DC | PRN

## 2017-05-22 MED ORDER — ROCURONIUM BROMIDE 10 MG/ML (PF) SYRINGE
PREFILLED_SYRINGE | INTRAVENOUS | Status: DC | PRN
  Administered 2017-05-22: 50 mg via INTRAVENOUS

## 2017-05-22 MED ORDER — SUGAMMADEX SODIUM 200 MG/2ML IV SOLN
INTRAVENOUS | Status: DC | PRN
  Administered 2017-05-22: 200 mg via INTRAVENOUS

## 2017-05-22 MED ORDER — SODIUM CHLORIDE 0.9 % IV SOLN
INTRAVENOUS | Status: DC
  Administered 2017-05-22: 75 mL/h via INTRAVENOUS

## 2017-05-22 MED ORDER — FENTANYL CITRATE (PF) 100 MCG/2ML IJ SOLN
INTRAMUSCULAR | Status: DC | PRN
  Administered 2017-05-22 (×2): 50 ug via INTRAVENOUS

## 2017-05-22 MED ORDER — ONDANSETRON HCL 4 MG PO TABS: 4.0000 mg | ORAL_TABLET | Freq: Four times a day (QID) | ORAL | Status: DC | PRN

## 2017-05-22 MED ORDER — ACETAMINOPHEN 500 MG PO TABS
1000.0000 mg | ORAL_TABLET | Freq: Four times a day (QID) | ORAL | Status: AC
  Administered 2017-05-22 – 2017-05-23 (×4): 1000 mg via ORAL
  Filled 2017-05-22 (×4): qty 2

## 2017-05-22 MED ORDER — SUGAMMADEX SODIUM 200 MG/2ML IV SOLN
INTRAVENOUS | Status: AC
  Filled 2017-05-22: qty 2

## 2017-05-22 MED ORDER — SODIUM CHLORIDE 0.9 % IR SOLN
Status: DC | PRN
  Administered 2017-05-22: 3000 mL

## 2017-05-22 MED ORDER — DEXAMETHASONE SODIUM PHOSPHATE 10 MG/ML IJ SOLN
INTRAMUSCULAR | Status: AC
  Filled 2017-05-22: qty 1

## 2017-05-22 MED ORDER — PANTOPRAZOLE SODIUM 40 MG PO TBEC: 40.0000 mg | DELAYED_RELEASE_TABLET | Freq: Every day | ORAL | Status: DC | PRN

## 2017-05-22 MED ORDER — TRANEXAMIC ACID 1000 MG/10ML IV SOLN: 1000.0000 mg | INTRAVENOUS | Status: DC

## 2017-05-22 MED ORDER — BUPIVACAINE HCL (PF) 0.25 % IJ SOLN
INTRAMUSCULAR | Status: AC
  Filled 2017-05-22: qty 30

## 2017-05-22 MED ORDER — SCOPOLAMINE 1 MG/3DAYS TD PT72
1.0000 | MEDICATED_PATCH | TRANSDERMAL | Status: DC
  Administered 2017-05-22: 1.5 mg via TRANSDERMAL

## 2017-05-22 MED ORDER — LIDOCAINE 2% (20 MG/ML) 5 ML SYRINGE
INTRAMUSCULAR | Status: AC
  Filled 2017-05-22: qty 5

## 2017-05-22 MED ORDER — ONDANSETRON HCL 4 MG/2ML IJ SOLN
INTRAMUSCULAR | Status: DC | PRN
  Administered 2017-05-22: 4 mg via INTRAVENOUS

## 2017-05-22 MED ORDER — BISACODYL 10 MG RE SUPP: 10.0000 mg | Freq: Every day | RECTAL | Status: DC | PRN

## 2017-05-22 MED ORDER — HYDROMORPHONE HCL 1 MG/ML IJ SOLN
INTRAMUSCULAR | Status: AC
  Filled 2017-05-22: qty 1

## 2017-05-22 MED ORDER — OXYCODONE HCL 5 MG PO TABS
5.0000 mg | ORAL_TABLET | ORAL | Status: DC | PRN
  Administered 2017-05-22 (×2): 5 mg via ORAL
  Filled 2017-05-22 (×2): qty 2
  Filled 2017-05-22 (×2): qty 1

## 2017-05-22 MED ORDER — DEXAMETHASONE SODIUM PHOSPHATE 10 MG/ML IJ SOLN
10.0000 mg | Freq: Once | INTRAMUSCULAR | Status: AC
  Administered 2017-05-22: 10 mg via INTRAVENOUS

## 2017-05-22 MED ORDER — PROPOFOL 10 MG/ML IV BOLUS
INTRAVENOUS | Status: AC
  Filled 2017-05-22: qty 60

## 2017-05-22 MED ORDER — MEPERIDINE HCL 50 MG/ML IJ SOLN: 6.2500 mg | INTRAMUSCULAR | Status: DC | PRN

## 2017-05-22 MED ORDER — LEVOTHYROXINE SODIUM 100 MCG PO TABS
100.0000 ug | ORAL_TABLET | Freq: Every day | ORAL | Status: DC
  Administered 2017-05-23: 100 ug via ORAL
  Filled 2017-05-22: qty 1

## 2017-05-22 MED ORDER — PROPOFOL 10 MG/ML IV BOLUS
INTRAVENOUS | Status: DC | PRN
  Administered 2017-05-22: 160 mg via INTRAVENOUS

## 2017-05-22 MED ORDER — OXYCODONE HCL 5 MG PO TABS
10.0000 mg | ORAL_TABLET | ORAL | Status: DC | PRN
  Administered 2017-05-23: 10 mg via ORAL

## 2017-05-22 MED ORDER — POLYETHYLENE GLYCOL 3350 17 G PO PACK: 17.0000 g | PACK | Freq: Every day | ORAL | Status: DC | PRN

## 2017-05-22 MED ORDER — HYDROCODONE-HOMATROPINE 5-1.5 MG/5ML PO SYRP: 5.0000 mL | ORAL_SOLUTION | Freq: Three times a day (TID) | ORAL | Status: DC | PRN

## 2017-05-22 MED ORDER — SCOPOLAMINE 1 MG/3DAYS TD PT72
MEDICATED_PATCH | TRANSDERMAL | Status: AC
  Filled 2017-05-22: qty 1

## 2017-05-22 MED ORDER — FLEET ENEMA 7-19 GM/118ML RE ENEM: 1.0000 | ENEMA | Freq: Once | RECTAL | Status: DC | PRN

## 2017-05-22 SURGICAL SUPPLY — 68 items
BAG SPEC THK2 15X12 ZIP CLS (MISCELLANEOUS)
BAG ZIPLOCK 12X15 (MISCELLANEOUS) ×3 IMPLANT
BIT DRILL 2.8X128 (BIT) ×3 IMPLANT
BLADE EXTENDED COATED 6.5IN (ELECTRODE) ×3 IMPLANT
BLADE SAW SGTL 73X25 THK (BLADE) ×3 IMPLANT
BRUSH FEMORAL CANAL (MISCELLANEOUS) IMPLANT
COVER SURGICAL LIGHT HANDLE (MISCELLANEOUS) ×3 IMPLANT
DRAPE INCISE IOBAN 66X45 STRL (DRAPES) ×3 IMPLANT
DRAPE ORTHO SPLIT 77X108 STRL (DRAPES) ×6
DRAPE POUCH INSTRU U-SHP 10X18 (DRAPES) ×3 IMPLANT
DRAPE SURG ORHT 6 SPLT 77X108 (DRAPES) ×4 IMPLANT
DRAPE U-SHAPE 47X51 STRL (DRAPES) ×3 IMPLANT
DRSG EMULSION OIL 3X16 NADH (GAUZE/BANDAGES/DRESSINGS) ×3 IMPLANT
DRSG MEPILEX BORDER 4X4 (GAUZE/BANDAGES/DRESSINGS) ×5 IMPLANT
DRSG MEPILEX BORDER 4X8 (GAUZE/BANDAGES/DRESSINGS) ×3 IMPLANT
DURAPREP 26ML APPLICATOR (WOUND CARE) ×4 IMPLANT
ELECT REM PT RETURN 15FT ADLT (MISCELLANEOUS) ×3 IMPLANT
EVACUATOR 1/8 PVC DRAIN (DRAIN) ×3 IMPLANT
FACESHIELD WRAPAROUND (MASK) ×12 IMPLANT
FACESHIELD WRAPAROUND OR TEAM (MASK) ×8 IMPLANT
GAUZE SPONGE 4X4 12PLY STRL (GAUZE/BANDAGES/DRESSINGS) ×3 IMPLANT
GLOVE BIO SURGEON STRL SZ7.5 (GLOVE) ×3 IMPLANT
GLOVE BIO SURGEON STRL SZ8 (GLOVE) ×8 IMPLANT
GLOVE BIOGEL PI IND STRL 7.0 (GLOVE) ×1 IMPLANT
GLOVE BIOGEL PI IND STRL 7.5 (GLOVE) ×3 IMPLANT
GLOVE BIOGEL PI IND STRL 8 (GLOVE) ×6 IMPLANT
GLOVE BIOGEL PI INDICATOR 7.0 (GLOVE) ×1
GLOVE BIOGEL PI INDICATOR 7.5 (GLOVE) ×3
GLOVE BIOGEL PI INDICATOR 8 (GLOVE) ×3
GLOVE ECLIPSE 7.0 STRL STRAW (GLOVE) ×1 IMPLANT
GLOVE SURG SS PI 6.5 STRL IVOR (GLOVE) ×6 IMPLANT
GLOVE SURG SS PI 7.5 STRL IVOR (GLOVE) ×2 IMPLANT
GOWN STRL REUS W/TWL LRG LVL3 (GOWN DISPOSABLE) ×6 IMPLANT
GOWN STRL REUS W/TWL XL LVL3 (GOWN DISPOSABLE) ×5 IMPLANT
HANDPIECE INTERPULSE COAX TIP (DISPOSABLE)
HEAD M SROM 36MM PLUS (Hips) IMPLANT
IMMOBILIZER KNEE 20 (SOFTGOODS)
IMMOBILIZER KNEE 20 THIGH 36 (SOFTGOODS) IMPLANT
LINER MARATHON NEUT +4X54X36 (Hips) ×1 IMPLANT
MANIFOLD NEPTUNE II (INSTRUMENTS) ×3 IMPLANT
MARKER SKIN DUAL TIP RULER LAB (MISCELLANEOUS) ×3 IMPLANT
NDL SAFETY ECLIPSE 18X1.5 (NEEDLE) ×2 IMPLANT
NEEDLE HYPO 18GX1.5 SHARP (NEEDLE) ×3
PADDING CAST COTTON 6X4 STRL (CAST SUPPLIES) ×3 IMPLANT
PASSER SUT SWANSON 36MM LOOP (INSTRUMENTS) ×3 IMPLANT
POSITIONER SURGICAL ARM (MISCELLANEOUS) ×3 IMPLANT
PRESSURIZER FEMORAL UNIV (MISCELLANEOUS) IMPLANT
SET HNDPC FAN SPRY TIP SCT (DISPOSABLE) IMPLANT
SPONGE LAP 18X18 X RAY DECT (DISPOSABLE) ×3 IMPLANT
SROM M HEAD 36MM PLUS (Hips) ×3 IMPLANT
STAPLER VISISTAT 35W (STAPLE) ×2 IMPLANT
SUCTION FRAZIER HANDLE 10FR (MISCELLANEOUS) ×1
SUCTION TUBE FRAZIER 10FR DISP (MISCELLANEOUS) ×2 IMPLANT
SUT ETHIBOND NAB CT1 #1 30IN (SUTURE) ×8 IMPLANT
SUT MNCRL AB 4-0 PS2 18 (SUTURE) ×1 IMPLANT
SUT VIC AB 1 CT1 27 (SUTURE) ×9
SUT VIC AB 1 CT1 27XBRD ANTBC (SUTURE) ×6 IMPLANT
SUT VIC AB 2-0 CT1 27 (SUTURE) ×9
SUT VIC AB 2-0 CT1 TAPERPNT 27 (SUTURE) ×6 IMPLANT
SUT VLOC 180 0 24IN GS25 (SUTURE) ×6 IMPLANT
SWAB COLLECTION DEVICE MRSA (MISCELLANEOUS) ×3 IMPLANT
SWAB CULTURE ESWAB REG 1ML (MISCELLANEOUS) IMPLANT
SYR 50ML LL SCALE MARK (SYRINGE) ×3 IMPLANT
TOWEL OR 17X26 10 PK STRL BLUE (TOWEL DISPOSABLE) ×6 IMPLANT
TOWER CARTRIDGE SMART MIX (DISPOSABLE) IMPLANT
TRAY FOLEY W/METER SILVER 16FR (SET/KITS/TRAYS/PACK) ×3 IMPLANT
TUBE KAMVAC SUCTION (TUBING) IMPLANT
YANKAUER SUCT BULB TIP 10FT TU (MISCELLANEOUS) ×3 IMPLANT

## 2017-05-22 NOTE — Interval H&P Note (Signed)
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 After consideration of risks, benefits and other options for treatment, the patient has consented to  Procedure(s): Left hip bearing surface versus THA revision (Left) as a surgical intervention .  The patient's history has been reviewed, patient examined, no change in status, stable for surgery.  I have reviewed the patient's chart and labs.  Questions were answered to the patient's satisfaction.     Pilar Plate [name]

## 2017-05-22 NOTE — Anesthesia Procedure Notes (Signed)
Procedure Name: Intubation Date/Time: 05/22/2017 1:44 PM Performed by: Talbot Grumbling, CRNA Pre-anesthesia Checklist: Patient identified, Emergency Drugs available, Suction available and Patient being monitored Patient Re-evaluated:Patient Re-evaluated prior to induction Oxygen Delivery Method: Circle system utilized Preoxygenation: Pre-oxygenation with 100% oxygen Induction Type: IV induction Ventilation: Mask ventilation without difficulty Laryngoscope Size: Mac and 3 Grade View: Grade I Tube type: Oral Tube size: 7.5 mm Number of attempts: 1 Airway Equipment and Method: Stylet Placement Confirmation: ETT inserted through vocal cords under direct vision,  positive ETCO2 and breath sounds checked- equal and bilateral Secured at: 21 cm Tube secured with: Tape Dental Injury: Teeth and Oropharynx as per pre-operative assessment

## 2017-05-22 NOTE — Brief Op Note (Signed)
05/22/2017  3:17 PM  PATIENT:  Marlou Starks  57 y.o. 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  PROCEDURE:  Procedure(s): Left hip bearing surface and head revision (Left) LEFT KNEE ASPIRATION (Left)  SURGEON:  Surgeon(s) and Role:    Gaynelle Arabian, MD - Primary  PHYSICIAN ASSISTANT:   ASSISTANTS: Arlee Muslim, PA-C   ANESTHESIA:   general  EBL:  300 mL   BLOOD ADMINISTERED:none  DRAINS: (Medium) Hemovact drain(s) in the left hip with  Suction Open   LOCAL MEDICATIONS USED:  MARCAINE     COUNTS:  YES  TOURNIQUET:  * No tourniquets in log *  DICTATION: .Other Dictation: Dictation Number [id number]  PLAN OF CARE: Admit to inpatient   PATIENT DISPOSITION:  PACU - hemodynamically stable.

## 2017-05-22 NOTE — Transfer of Care (Signed)
Immediate Anesthesia Transfer of Care Note  Patient: Victoria [name]  Procedure(s) Performed: Left hip bearing surface and head revision (Left Hip) LEFT KNEE ASPIRATION (Left Knee)  Patient Location: PACU  Anesthesia Type:General  Level of Consciousness: sedated  Airway & Oxygen Therapy: Patient Spontanous Breathing and Patient connected to face mask oxygen  Post-op Assessment: Report given to RN and Post -op Vital signs reviewed and stable  Post vital signs: Reviewed and stable  Last Vitals:  Vitals Value Taken Time  BP 138/96 05/22/2017  3:54 PM  Temp    Pulse    Resp 15 05/22/2017  3:55 PM  SpO2    Vitals shown include unvalidated device data.  Last Pain:  Vitals:   05/22/17 1216  TempSrc:   PainSc: 8       Patients Stated Pain Goal: 5 (16/10/96 0454)  Complications: No apparent anesthesia complications

## 2017-05-22 NOTE — Anesthesia Postprocedure Evaluation (Signed)
Anesthesia Post Note  Patient: Victoria [name]  Procedure(s) Performed: Left hip bearing surface and head revision (Left Hip) LEFT KNEE ASPIRATION (Left Knee)     Patient location during evaluation: PACU Anesthesia Type: General Level of consciousness: awake and alert and oriented Pain management: pain level controlled Vital Signs Assessment: post-procedure vital signs reviewed and stable Respiratory status: spontaneous breathing, nonlabored ventilation, respiratory function stable and patient connected to nasal cannula oxygen Cardiovascular status: blood pressure returned to baseline and stable Postop Assessment: no apparent nausea or vomiting Anesthetic complications: no    Last Vitals:  Vitals:   05/22/17 1630 05/22/17 1645  BP: (!) 149/91 139/85  Pulse: 65 70  Resp: 10 13  Temp:  (!) 36.3 C  SpO2: 99% 96%    Last Pain:  Vitals:   05/22/17 1630  TempSrc:   PainSc: Asleep                 FOSTER,MICHAEL A.

## 2017-05-22 NOTE — Anesthesia Preprocedure Evaluation (Addendum)
Anesthesia Evaluation  Patient identified by MRN, date of birth, ID band Patient awake    Reviewed: Allergy & Precautions, NPO status , Patient's Chart, lab work & pertinent test results  History of Anesthesia Complications (+) PONV and history of anesthetic complications  Airway Mallampati: II  TM Distance: >3 FB Neck ROM: Full    Dental no notable dental hx. (+) Teeth Intact   Pulmonary neg pulmonary ROS,  Recent + rapid flu test 05/06/2017  Symptoms resolved   Pulmonary exam normal breath sounds clear to auscultation       Cardiovascular Normal cardiovascular exam Rhythm:Regular Rate:Normal  Hx/o Palpitations Echo 04/2016- - Left ventricle: The cavity size was normal. Wall thickness was   normal. Systolic function was normal. The estimated ejection   fraction was in the range of 55% to 60%. Left ventricular   diastolic function parameters were normal.  EKG 03/2016- Sinus bradycardia otherwise normal   Neuro/Psych  Headaches, negative psych ROS   GI/Hepatic Neg liver ROS, GERD  Medicated and Controlled,Hx/o Gastric bypass Hx/o Dumping syndrome   Endo/Other  Hypothyroidism Copper deficiency  Renal/GU negative Renal ROS  negative genitourinary   Musculoskeletal  (+) Arthritis , Osteoarthritis,  Failed Left THR   Abdominal (+) + obese,   Peds  Hematology  (+) anemia ,   Anesthesia Other Findings   Reproductive/Obstetrics                           Anesthesia Physical Anesthesia Plan  ASA: III  Anesthesia Plan: General   Post-op Pain Management:    Induction:   PONV Risk Score and Plan: Ondansetron, Dexamethasone, Propofol infusion and Scopolamine patch - Pre-op  Airway Management Planned: Oral ETT  Additional Equipment:   Intra-op Plan:   Post-operative Plan: Extubation in OR  Informed Consent: I have reviewed the patients History and Physical, chart, labs and discussed the  procedure including the risks, benefits and alternatives for the proposed anesthesia with the patient or authorized representative who has indicated his/her understanding and acceptance.   Dental advisory given  Plan Discussed with: Anesthesiologist, CRNA and Surgeon  Anesthesia Plan Comments: (Patient refuses regional anesthesia. Explained increased risks of pulmonary complications so soon after documented Hx/o Influenza. Patient accepts risks of GA.)       Anesthesia Quick Evaluation

## 2017-05-23 LAB — BASIC METABOLIC PANEL
Anion gap: 8 (ref 5–15)
BUN: 9 mg/dL (ref 6–20)
CO2: 23 mmol/L (ref 22–32)
Calcium: 8.6 mg/dL — ABNORMAL LOW (ref 8.9–10.3)
Chloride: 106 mmol/L (ref 101–111)
Creatinine, Ser: 0.77 mg/dL (ref 0.44–1.00)
GFR calc Af Amer: >60 mL/min (ref >60)
GFR calc non Af Amer: >60 mL/min (ref >60)
Glucose, Bld: 129 mg/dL — ABNORMAL HIGH (ref 65–99)
Potassium: 4.8 mmol/L (ref 3.5–5.1)
Sodium: 137 mmol/L (ref 135–145)

## 2017-05-23 LAB — CBC
HCT: 32 % — ABNORMAL LOW (ref 36.0–46.0)
Hemoglobin: 10.5 g/dL — ABNORMAL LOW (ref 12.0–15.0)
MCH: 31.5 pg (ref 26.0–34.0)
MCHC: 32.8 g/dL (ref 30.0–36.0)
MCV: 96.1 fL (ref 78.0–100.0)
Platelets: 223 10*3/uL (ref 150–400)
RBC: 3.33 MIL/uL — ABNORMAL LOW (ref 3.87–5.11)
RDW: 14.3 % (ref 11.5–15.5)
WBC: 5.1 10*3/uL (ref 4.0–10.5)

## 2017-05-23 MED ORDER — SODIUM CHLORIDE 0.9 % IV BOLUS
250.0000 mL | Freq: Once | INTRAVENOUS | Status: AC
  Administered 2017-05-23: 250 mL via INTRAVENOUS

## 2017-05-23 MED ORDER — CYCLOBENZAPRINE HCL 10 MG PO TABS: 10.0000 mg | ORAL_TABLET | Freq: Three times a day (TID) | ORAL | 0 refills | Status: DC | PRN

## 2017-05-23 MED ORDER — TRAMADOL HCL 50 MG PO TABS: 50.0000 mg | ORAL_TABLET | Freq: Four times a day (QID) | ORAL | 0 refills | Status: DC | PRN

## 2017-05-23 MED ORDER — HYDROMORPHONE HCL 2 MG PO TABS: 2.0000 mg | ORAL_TABLET | ORAL | 0 refills | Status: DC | PRN

## 2017-05-23 MED ORDER — HYDROMORPHONE HCL 2 MG PO TABS
2.0000 mg | ORAL_TABLET | ORAL | Status: DC | PRN
  Administered 2017-05-23 (×2): 4 mg via ORAL
  Administered 2017-05-23: 2 mg via ORAL
  Filled 2017-05-23: qty 1
  Filled 2017-05-23 (×2): qty 2

## 2017-05-23 MED ORDER — ASPIRIN 325 MG PO TBEC: 325.0000 mg | DELAYED_RELEASE_TABLET | Freq: Two times a day (BID) | ORAL | 0 refills | Status: DC

## 2017-05-23 MED ORDER — ASPIRIN EC 325 MG PO TBEC
325.0000 mg | DELAYED_RELEASE_TABLET | Freq: Two times a day (BID) | ORAL | Status: DC
  Administered 2017-05-23: 325 mg via ORAL

## 2017-05-23 MED FILL — ASPIRIN EC 325 MG TABLET: 325 | 21 days supply | Qty: 42 | Fill #0

## 2017-05-23 MED FILL — HYDROmorphone HCL 2 MG TABS: 2 | 5 days supply | Qty: 60 | Fill #0

## 2017-05-23 MED FILL — traMADol HCL 50 MG TABS: 50 | 7 days supply | Qty: 56 | Fill #0

## 2017-05-23 MED FILL — CYCLOBENZAPRINE 10 MG TAB: 10 | 30 days supply | Qty: 90 | Fill #0

## 2017-05-23 NOTE — Progress Notes (Signed)
Subjective: 1 Day Post-Op Procedure(s) (LRB): Left hip bearing surface and head revision (Left) LEFT KNEE ASPIRATION (Left) Patient reports pain as mild.   Patient seen in rounds with Dr. Wynelle Link. Patient is well, but has had some minor complaints of pain in the hip, requiring pain medications We will start therapy today.  If they do well with therapy and meets all goals, then will allow home later this afternoon following therapy. Plan is to go Home after hospital stay.  Objective: Vital signs in last 24 hours: Temp:  [97.4 F (36.3 C)-99 F (37.2 C)] 98.2 F (36.8 C) (03/28 0609) Pulse Rate:  [56-78] 56 (03/28 0609) Resp:  [10-17] 17 (03/28 0609) BP: (93-152)/(52-112) 97/53 (03/28 0609) SpO2:  [96 %-100 %] 100 % (03/28 0609) Weight:  [100.2 kg (221 lb)] 100.2 kg (221 lb) (03/27 1707)  Intake/Output from previous day:  Intake/Output Summary (Last 24 hours) at 05/23/2017 0738 Last data filed at 05/23/2017 7412 Gross per 24 hour  Intake 3593.5 ml  Output 2795 ml  Net 798.5 ml    Intake/Output this shift: No intake/output data recorded.  Labs: Recent Labs    05/23/17 0542  HGB 10.5*   Recent Labs    05/23/17 0542  WBC 5.1  RBC 3.33*  HCT 32.0*  PLT 223   Recent Labs    05/23/17 0542  NA 137  K 4.8  CL 106  CO2 23  BUN 9  CREATININE 0.77  GLUCOSE 129*  CALCIUM 8.6*   No results for input(s): LABPT, INR in the last 72 hours.  EXAM General - Patient is Alert, Appropriate and Oriented Extremity - Neurovascular intact Sensation intact distally Intact pulses distally Dorsiflexion/Plantar flexion intact Dressing - dressing C/D/I Motor Function - intact, moving foot and toes well on exam.  Hemovac pulled without difficulty.  Past Medical History:  Diagnosis Date  . Bilateral cataracts   . Chronic leukopenia followed by dr Alvy Bimler (oncology/hematology)   intermittant since 2010  . Degenerative joint disease of low back 09/02/15    Dr. Maureen Ralphs  .  Fibrocystic breast   . Flu 05/06/2017   POSITIVE RAPID FLU TEST , SEE EPIC ENCOUNTER ; DR.BAXLEY ; today 3-121-19 presents reporting resolution flu symtpoms and comepleteion of abx ; afebrile   . GERD (gastroesophageal reflux disease)   . Headache    gets headaches that can last for up to 4 days ; to have MRI on 05-16-17 for eval   . Heart palpitations    SEE EPIC ENCOUTNER , CARDIOLOGY DR. Tressia Miners TURNER 2018; reports on 05-16-17 " i haven't had any bouts of those lately"    . Hemorrhoids   . History of Clostridium difficile infection 12/2010  . History of exercise intolerance    ETT on 04-27-2016-- negative ([facility] treadmill score 7)  . History of Helicobacter pylori infection 2001 and 09/ 2012  . HSV-2 infection    genital  . Hx of adenomatous colonic polyps 10/17/2007  . Hydradenitis    per pt currently no tx  . Hypothyroidism, postsurgical 1980  . Metallosis    s/p left hip replacement 8 years ago   . Osteoarthritis   . PONV (postoperative nausea and vomiting)   . 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 Reactive hypoglycemia endocrinologist-- dr Dwyane Dee   post gastrectomy dumping syndrome  . Sebaceous cyst    PERIANAL AREA  . Varicose veins   . Vitamin B 12 deficiency   . Vitamin D deficiency  Assessment/Plan: 1 Day Post-Op Procedure(s) (LRB): Left hip bearing surface and head revision (Left) LEFT KNEE ASPIRATION (Left) Principal Problem:   Failed total hip arthroplasty (HCC) Active Problems:   Failed total hip arthroplasty, sequela  Estimated body mass index is 35.14 kg/m as calculated from the following:   Height as of this encounter: 5' 6.5" (1.689 m).   Weight as of this encounter: 100.2 kg (221 lb). Advance diet Up with therapy Discharge home with home health  DVT Prophylaxis - Aspirin 325 mg BID Weight Bearing As Tolerated left Leg Hemovac Pulled Begin Therapy  If meets goals and able to go home: Discharge home with home health Diet -  Regular diet Follow up - in 2 weeks Activity - WBAT Disposition - Home Condition Upon Discharge - pending therapy D/C Meds - See DC Summary DVT Prophylaxis - Aspirin 325 mg BID for three weeks, then baby 81 mg Aspirin daily for three additional weeks.  Arlee Muslim, PA-C Orthopaedic Surgery 05/23/2017, 7:38 AM

## 2017-05-23 NOTE — Progress Notes (Signed)
Discharge planning, spoke with patient at bedside. Have chosen [facility] for [facility] PT, evaluate and treat. Contacted [facility] for referral. Has RW and does not need a 3n1. [phone]

## 2017-05-23 NOTE — Progress Notes (Signed)
Physical Therapy Treatment Patient Details Name: Victoria [name] MRN: [id number] DOB: 07-13-1960 Today's Date: 05/23/2017    History of Present Illness Pt is a 57 year old female s/p Left hip bearing surface and head revision with hx of bil THAs    PT Comments    Pt ambulated in hallway and practiced steps.  Pt also performed LE exercises and provided with HEP.  Pt feels ready to d/c home today.  Follow Up Recommendations  Home health PT;Follow surgeon's recommendation for DC plan and follow-up therapies     Equipment Recommendations  None recommended by PT    Recommendations for Other Services       Precautions / Restrictions Precautions Precautions: Posterior Hip;Fall Precaution Comments: reviewed posterior hip precautions and provided handout Restrictions Other Position/Activity Restrictions: WBAT    Mobility  Bed Mobility Overal bed mobility: Needs Assistance Bed Mobility: Supine to Sit     Supine to sit: Min guard;HOB elevated     General bed mobility comments: pt up in recliner  Transfers Overall transfer level: Needs assistance Equipment used: Rolling walker (2 wheeled) Transfers: Sit to/from Stand Sit to Stand: Min guard         General transfer comment: verbal cues for UE and LE positioning  Ambulation/Gait Ambulation/Gait assistance: Min guard Ambulation Distance (Feet): 160 Feet Assistive device: Rolling walker (2 wheeled) Gait Pattern/deviations: Decreased stride length;Step-to pattern;Decreased stance time - left;Antalgic     General Gait Details: verbal cues for sequence, RW positioning, step length, posture   Stairs Stairs: Yes   Stair Management: Step to pattern;Forwards;One rail Left Number of Stairs: 2 General stair comments: verbal cues for sequence, technique, safety, pt reports understanding  Wheelchair Mobility    Modified Rankin (Stroke Patients Only)       Balance                                            Cognition Arousal/Alertness: Awake/alert Behavior During Therapy: WFL for tasks assessed/performed Overall Cognitive Status: Within Functional Limits for tasks assessed                                        Exercises Total Joint Exercises Ankle Circles/Pumps: AROM;Both;10 reps Quad Sets: AROM;10 reps;Both Gluteal Sets: AROM;10 reps;Both Heel Slides: AROM;10 reps;Left(within precautions) Hip ABduction/ADduction: AROM;10 reps;Left Long Arc Quad: AROM;10 reps;Left;Seated    General Comments        Pertinent Vitals/Pain Pain Assessment: 0-10 Pain Score: 3  Pain Location: L hip Pain Descriptors / Indicators: Aching;Sore Pain Intervention(s): Repositioned;Monitored during session;Limited activity within patient's tolerance    Home Living Family/patient expects to be discharged to:: Private residence Living Arrangements: Spouse/significant other Available Help at Discharge: Family Type of Home: House Home Access: Stairs to enter Entrance Stairs-Rails: Right;Left Home Layout: Bed/bath upstairs;Two level Home Equipment: Walker - 2 wheels;Bedside commode      Prior Function Level of Independence: Independent          PT Goals (current goals can now be found in the care plan section) Acute Rehab PT Goals PT Goal Formulation: With patient Time For Goal Achievement: 05/30/17 Potential to Achieve Goals: Good Progress towards PT goals: Progressing toward goals    Frequency    7X/week      PT Plan Current plan remains  appropriate    Co-evaluation              AM-PAC PT "6 Clicks" Daily Activity  Outcome Measure  Difficulty turning over in bed (including adjusting bedclothes, sheets and blankets)?: A Little Difficulty moving from lying on back to sitting on the side of the bed? : A Little Difficulty sitting down on and standing up from a chair with arms (e.g., wheelchair, bedside commode, etc,.)?: A Little Help needed moving to and  from a bed to chair (including a wheelchair)?: A Little Help needed walking in hospital room?: A Little Help needed climbing 3-5 steps with a railing? : A Little 6 Click Score: 18    End of Session Equipment Utilized During Treatment: Gait belt Activity Tolerance: Patient tolerated treatment well Patient left: with call bell/phone within reach;in chair Nurse Communication: Mobility status PT Visit Diagnosis: Other abnormalities of gait and mobility (R26.89)     Time: 1420-1436 PT Time Calculation (min) (ACUTE ONLY): 16 min  Charges:  $Gait Training: 8-22 mins                    G Codes:      Carmelia Bake, PT, DPT 05/23/2017 Pager: [phone]   York Ram E 05/23/2017, 3:28 PM

## 2017-05-23 NOTE — Evaluation (Signed)
Physical Therapy Evaluation Patient Details Name: Victoria [name] MRN: [id number] DOB: 02/26/1961 Today's Date: 05/23/2017   History of Present Illness  Pt is a 57 year old female s/p Left hip bearing surface and head revision with hx of bil THAs  Clinical Impression  Patient is s/p above surgery resulting in functional limitations due to the deficits listed below (see PT Problem List).  Patient will benefit from skilled PT to increase their independence and safety with mobility to allow discharge to the venue listed below.  Pt ambulated in hallway as tolerated and plans to d/c home with spouse and family assist as needed.     Follow Up Recommendations Home health PT;Follow surgeon's recommendation for DC plan and follow-up therapies    Equipment Recommendations  None recommended by PT    Recommendations for Other Services       Precautions / Restrictions Precautions Precautions: Posterior Hip;Fall Restrictions Other Position/Activity Restrictions: WBAT      Mobility  Bed Mobility Overal bed mobility: Needs Assistance Bed Mobility: Supine to Sit     Supine to sit: Min guard;HOB elevated     General bed mobility comments: verbal cues for self assist  Transfers Overall transfer level: Needs assistance Equipment used: Rolling walker (2 wheeled) Transfers: Sit to/from Stand Sit to Stand: Min assist         General transfer comment: verbal cues for UE and LE positioning, assist to rise and steady  Ambulation/Gait Ambulation/Gait assistance: Min guard Ambulation Distance (Feet): 80 Feet Assistive device: Rolling walker (2 wheeled) Gait Pattern/deviations: Decreased stride length;Step-to pattern;Decreased stance time - left;Antalgic     General Gait Details: verbal cues for sequence, RW positioning, step length, posture  Stairs            Wheelchair Mobility    Modified Rankin (Stroke Patients Only)       Balance                                              Pertinent Vitals/Pain Pain Assessment: 0-10 Pain Score: 4  Pain Location: L hip Pain Descriptors / Indicators: Aching;Sore Pain Intervention(s): Limited activity within patient's tolerance;Repositioned;Monitored during session;Ice applied    Home Living Family/patient expects to be discharged to:: Private residence Living Arrangements: Spouse/significant other Available Help at Discharge: Family Type of Home: House Home Access: Stairs to enter Entrance Stairs-Rails: Psychiatric nurse of Steps: 5 Home Layout: Bed/bath upstairs;Two level Home Equipment: Walker - 2 wheels;Bedside commode      Prior Function Level of Independence: Independent               Hand Dominance        Extremity/Trunk Assessment        Lower Extremity Assessment Lower Extremity Assessment: LLE deficits/detail LLE Deficits / Details: anticipated post op hip weakness, able to perform ankle pumps       Communication   Communication: No difficulties  Cognition Arousal/Alertness: Awake/alert Behavior During Therapy: WFL for tasks assessed/performed Overall Cognitive Status: Within Functional Limits for tasks assessed                                        General Comments      Exercises     Assessment/Plan    PT Assessment Patient  needs continued PT services  PT Problem List Decreased strength;Decreased mobility;Decreased knowledge of use of DME;Pain;Decreased knowledge of precautions       PT Treatment Interventions Gait training;DME instruction;Therapeutic activities;Therapeutic exercise;Patient/family education;Functional mobility training;Stair training    PT Goals (Current goals can be found in the Care Plan section)  Acute Rehab PT Goals PT Goal Formulation: With patient Time For Goal Achievement: 05/30/17 Potential to Achieve Goals: Good    Frequency 7X/week   Barriers to discharge        Co-evaluation                AM-PAC PT "6 Clicks" Daily Activity  Outcome Measure Difficulty turning over in bed (including adjusting bedclothes, sheets and blankets)?: A Little Difficulty moving from lying on back to sitting on the side of the bed? : A Lot Difficulty sitting down on and standing up from a chair with arms (e.g., wheelchair, bedside commode, etc,.)?: Unable Help needed moving to and from a bed to chair (including a wheelchair)?: A Little Help needed walking in hospital room?: A Little Help needed climbing 3-5 steps with a railing? : A Lot 6 Click Score: 14    End of Session Equipment Utilized During Treatment: Gait belt Activity Tolerance: Patient limited by fatigue Patient left: with call bell/phone within reach;in chair   PT Visit Diagnosis: Other abnormalities of gait and mobility (R26.89)    Time: 1607-3710 PT Time Calculation (min) (ACUTE ONLY): 16 min   Charges:   PT Evaluation $PT Eval Low Complexity: 1 Low     PT G CodesCarmelia Bake, PT, DPT 05/23/2017 Pager: [phone]  York Ram E 05/23/2017, 12:58 PM

## 2017-05-23 NOTE — Discharge Summary (Signed)
Physician Discharge Summary   Patient ID: Victoria [name] MRN: [id number] DOB/AGE: 11/02/1960 57 y.o.  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   Admission Diagnoses:  Past Medical History:  Diagnosis Date  . Bilateral cataracts   . Chronic leukopenia followed by dr Alvy Bimler (oncology/hematology)   intermittant since 2010  . Degenerative joint disease of low back 09/02/15    Dr. Maureen Ralphs  . Fibrocystic breast   . Flu 05/06/2017   POSITIVE RAPID FLU TEST , SEE EPIC ENCOUNTER ; DR.BAXLEY ; today 3-121-19 presents reporting resolution flu symtpoms and comepleteion of abx ; afebrile   . GERD (gastroesophageal reflux disease)   . Headache    gets headaches that can last for up to 4 days ; to have MRI on 05-16-17 for eval   . Heart palpitations    SEE EPIC ENCOUTNER , CARDIOLOGY DR. Tressia Miners TURNER 2018; reports on 05-16-17 " i haven't had any bouts of those lately"    . Hemorrhoids   . History of Clostridium difficile infection 12/2010  . History of exercise intolerance    ETT on 04-27-2016-- negative ([facility] treadmill score 7)  . History of Helicobacter pylori infection 2001 and 09/ 2012  . HSV-2 infection    genital  . Hx of adenomatous colonic polyps 10/17/2007  . Hydradenitis    per pt currently no tx  . Hypothyroidism, postsurgical 1980  . Metallosis    s/p left hip replacement 8 years ago   . Osteoarthritis   . PONV (postoperative nausea and vomiting)   . 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 Reactive hypoglycemia endocrinologist-- dr Dwyane Dee   post gastrectomy dumping syndrome  . Sebaceous cyst    PERIANAL AREA  . Varicose veins   . Vitamin B 12 deficiency   . Vitamin D deficiency    Discharge Diagnoses:   Principal Problem:   Failed total hip arthroplasty ([place]) Active Problems:   Failed total hip arthroplasty, sequela  Estimated body mass index is 35.14 kg/m as  calculated from the following:   Height as of this encounter: 5' 6.5" (1.689 m).   Weight as of this encounter: 100.2 kg (221 lb).  Procedure(s) (LRB): Left hip bearing surface and head revision (Left) LEFT KNEE ASPIRATION (Left)   Consults: None  HPI: Victoria [name] is a 57 year old female who had a left total hip arthroplasty done several years ago with metal on metal.  She has had pain recently and has had abnormality in cobalt chromium levels. She presents now for bearing surface revision.   Laboratory Data: Admission on 05/22/2017  Component Date Value Ref Range Status  . WBC 05/23/2017 5.1  4.0 - 10.5 K/uL Final  . RBC 05/23/2017 3.33* 3.87 - 5.11 MIL/uL Final  . Hemoglobin 05/23/2017 10.5* 12.0 - 15.0 g/dL Final  . HCT 05/23/2017 32.0* 36.0 - 46.0 % Final  . MCV 05/23/2017 96.1  78.0 - 100.0 fL Final  . MCH 05/23/2017 31.5  26.0 - 34.0 pg Final  . MCHC 05/23/2017 32.8  30.0 - 36.0 g/dL Final  . RDW 05/23/2017 14.3  11.5 - 15.5 % Final  . Platelets 05/23/2017 223  150 - 400 K/uL Final   Performed at [facility], [place] [address] [place], [place] [address]  . Sodium 05/23/2017 137  135 - 145 mmol/L Final  . Potassium 05/23/2017 4.8  3.5 - 5.1 mmol/L Final  . Chloride 05/23/2017 106  101 -  111 mmol/L Final  . CO2 05/23/2017 23  22 - 32 mmol/L Final  . Glucose, Bld 05/23/2017 129* 65 - 99 mg/dL Final  . BUN 05/23/2017 9  6 - 20 mg/dL Final  . Creatinine, Ser 05/23/2017 0.77  0.44 - 1.00 mg/dL Final  . Calcium 05/23/2017 8.6* 8.9 - 10.3 mg/dL Final  . GFR calc non Af Amer 05/23/2017 >60  >60 mL/min Final  . GFR calc Af Amer 05/23/2017 >60  >60 mL/min Final   Comment: (NOTE) The eGFR has been calculated using the CKD EPI equation. This calculation has not been validated in all clinical situations. eGFR's persistently <60 mL/min signify possible Chronic Kidney Disease.   Georgiann Hahn gap 05/23/2017 8  5 - 15 Final   Performed at [facility], [place] [address] [place], [place] [address]  Hospital Outpatient Visit on 05/16/2017  Component Date Value Ref Range Status  . aPTT 05/16/2017 32  24 - 36 seconds Final   Performed at [facility], [place] [address] [place], [place] [address]  . Sodium 05/16/2017 142  135 - 145 mmol/L Final  . Potassium 05/16/2017 4.3  3.5 - 5.1 mmol/L Final  . Chloride 05/16/2017 105  101 - 111 mmol/L Final  . CO2 05/16/2017 27  22 - 32 mmol/L Final  . Glucose, Bld 05/16/2017 85  65 - 99 mg/dL Final  . BUN 05/16/2017 15  6 - 20 mg/dL Final  . Creatinine, Ser 05/16/2017 0.91  0.44 - 1.00 mg/dL Final  . Calcium 05/16/2017 9.0  8.9 - 10.3 mg/dL Final  . Total Protein 05/16/2017 7.4  6.5 - 8.1 g/dL Final  . Albumin 05/16/2017 3.6  3.5 - 5.0 g/dL Final  . AST 05/16/2017 21  15 - 41 U/L Final  . ALT 05/16/2017 21  14 - 54 U/L Final  . Alkaline Phosphatase 05/16/2017 55  38 - 126 U/L Final  . Total Bilirubin 05/16/2017 0.4  0.3 - 1.2 mg/dL Final  . GFR calc non Af Amer 05/16/2017 >60  >60 mL/min Final  . GFR calc Af Amer 05/16/2017 >60  >60 mL/min Final   Comment: (NOTE) The eGFR has been calculated using the CKD EPI equation. This calculation has not been validated in all clinical situations. eGFR's persistently <60 mL/min signify possible Chronic Kidney Disease.   Georgiann Hahn gap 05/16/2017 10  5 - 15 Final   Performed at [facility], [place] [address] [place], [place] [address]  . Prothrombin Time 05/16/2017 13.1  11.4 - 15.2 seconds Final  . INR 05/16/2017 1.00   Final   Performed at [facility], [place] [address], [place], [place] [address]  . ABO/RH(D) 05/16/2017 A POS   Final  . Antibody Screen 05/16/2017 NEG   Final  . Sample Expiration 05/16/2017 05/25/2017   Final  . Extend sample reason 05/16/2017    Final                   Value:NO TRANSFUSIONS OR PREGNANCY IN THE PAST 3 MONTHS Performed at [facility], [place] [address],  [place], [place] [address]   . MRSA, PCR 05/16/2017 NEGATIVE  NEGATIVE Final  . Staphylococcus aureus 05/16/2017 NEGATIVE  NEGATIVE Final   Comment: (NOTE) The Xpert SA Assay (FDA approved for NASAL specimens in patients 100 years of age and older), is one component of a comprehensive surveillance program. It is not intended to diagnose infection nor to guide or monitor  treatment. Performed at [facility], [place] [address] [place], [place] [address]   Office Visit on 05/06/2017  Component Date Value Ref Range Status  . WBC 05/06/2017 3.2* 3.8 - 10.8 Thousand/uL Final  . RBC 05/06/2017 3.75* 3.80 - 5.10 Million/uL Final  . Hemoglobin 05/06/2017 11.7  11.7 - 15.5 g/dL Final  . HCT 05/06/2017 34.6* 35.0 - 45.0 % Final  . MCV 05/06/2017 92.3  80.0 - 100.0 fL Final  . MCH 05/06/2017 31.2  27.0 - 33.0 pg Final  . MCHC 05/06/2017 33.8  32.0 - 36.0 g/dL Final  . RDW 05/06/2017 12.8  11.0 - 15.0 % Final  . Platelets 05/06/2017 198  140 - 400 Thousand/uL Final  . MPV 05/06/2017 12.2  7.5 - 12.5 fL Final  . Neutro Abs 05/06/2017 1,507  1,500 - 7,800 cells/uL Final  . Lymphs Abs 05/06/2017 1,050  850 - 3,900 cells/uL Final  . WBC mixed population 05/06/2017 416  200 - 950 cells/uL Final  . Eosinophils Absolute 05/06/2017 218  15 - 500 cells/uL Final  . Basophils Absolute 05/06/2017 10  0 - 200 cells/uL Final  . Neutrophils Relative % 05/06/2017 47.1  % Final  . Total Lymphocyte 05/06/2017 32.8  % Final  . Monocytes Relative 05/06/2017 13.0  % Final  . Eosinophils Relative 05/06/2017 6.8  % Final  . Basophils Relative 05/06/2017 0.3  % Final  . Influenza A, POC 05/06/2017 Positive* Negative Final   weakly positive      X-Rays:Mr Brain W Wo Contrast  Result Date: 05/16/2017 CLINICAL DATA:  Headache lasting several days. Lightheadedness, dizziness, and blurred vision. Previous gastric bypass for morbid obesity. EXAM: MRI HEAD WITHOUT AND WITH CONTRAST TECHNIQUE: Multiplanar,  multiecho pulse sequences of the brain and surrounding structures were obtained without and with intravenous contrast. CONTRAST:  78m MULTIHANCE GADOBENATE DIMEGLUMINE 529 MG/ML IV SOLN COMPARISON:  Report from previous MRI performed 12/05/2000. FINDINGS: Brain: No evidence for acute infarction, hemorrhage, mass lesion, hydrocephalus, or extra-axial fluid. Possible mild cortical atrophy, with prominence of the cisterns and sulci. No white matter disease of significance. Post infusion, no abnormal enhancement of the brain or meninges. Vascular: Normal flow voids. No evidence of venous sinus thrombosis. Skull and upper cervical spine: Mild tonsillar ectopia, 2 mm below the foramen magnum. This was described previously. Partial empty sella. Normal marrow signal. Sinuses/Orbits: Negative sinuses.  Prominent perioptic CSF. Other: Trace BILATERAL mastoid effusions. No nasopharyngeal process identified. IMPRESSION: Equivocal findings for idiopathic intracranial hypertension as a cause of headaches. Partial empty sella, slight tonsillar ectopia, and prominent CSF surrounding the optic nerves. No visible venous sinus stenosis or thrombosis. No acute intracranial abnormality and no evidence of significant white matter disease. If further investigation desired, lumbar puncture with measurement of opening pressure could be helpful in further evaluation. Electronically Signed   By: JStaci RighterM.D.   On: 05/16/2017 15:04   Dg Pelvis Portable  Result Date: 05/22/2017 CLINICAL DATA:  Failed LEFT hip replacement secondary to metallosis. EXAM: PORTABLE PELVIS 1-2 VIEWS COMPARISON:  05/21/2012. FINDINGS: The patient has undergone LEFT hip bearing surface and head revision, with no apparent adverse features. IMPRESSION: Satisfactory postoperative appearance.  Drain in good position. Electronically Signed   By: JStaci RighterM.D.   On: 05/22/2017 16:24    EKG: Orders placed or performed in visit on 04/12/16  . EKG 12-Lead       Hospital Course: [facility]  underwent the above state procedure without complications.  Patient tolerated the procedure well and was later transferred to the recovery room and then to the orthopaedic floor for postoperative care.  They were given PO and IV analgesics for pain control following their surgery.  They were given 24 hours of postoperative antibiotics of  Anti-infectives (From admission, onward)   Start     Dose/Rate Route Frequency Ordered Stop   05/22/17 2000  ceFAZolin (ANCEF) IVPB 2g/100 mL premix     2 g 200 mL/hr over 30 Minutes Intravenous Every 6 hours 05/22/17 1714 05/23/17 0403   05/22/17 1154  ceFAZolin (ANCEF) IVPB 2g/100 mL premix     2 g 200 mL/hr over 30 Minutes Intravenous On call to O.R. 05/22/17 1154 05/22/17 1400     and started on DVT prophylaxis in the form of Aspirin.   PT and OT were ordered for total hip protocol.  The patient was allowed to be WBAT with therapy. Discharge planning was consulted to help with postop disposition and equipment needs.  Patient had a decent night on the evening of surgery.  They started to get up OOB with therapy on day one.  Hemovac drain was pulled without difficulty.  Dressing was checked and was clean and dry.  Patient was seen in rounds on day one and was ready to go home later that same day.   Discharge home with home health Diet - Regular diet Follow up - in 2 weeks Activity - WBAT Disposition - Home Condition Upon Discharge - improving D/C Meds - See DC Summary DVT Prophylaxis - Aspirin 325 mg BID for three weeks, then baby 81 mg Aspirin daily for three additional weeks.   Discharge Instructions    Call MD / Call 911   Complete by:  As directed    If you experience chest pain or shortness of breath, CALL 911 and be transported to the hospital emergency room.  If you develope a fever above 101 F, pus (white drainage) or increased drainage or redness at the  wound, or calf pain, call your surgeon's office.   Change dressing   Complete by:  As directed    You may change your dressing dressing daily with sterile 4 x 4 inch gauze dressing and paper tape.  Do not submerge the incision under water.   Constipation Prevention   Complete by:  As directed    Drink plenty of fluids.  Prune juice may be helpful.  You may use a stool softener, such as Colace (over the counter) 100 mg twice a day.  Use MiraLax (over the counter) for constipation as needed.   Diet - low sodium heart healthy   Complete by:  As directed    Discharge instructions   Complete by:  As directed    Take a full dose 325 mg Aspirin twice a day for three weeks and then reduce to a baby 81 mg Aspirin daily for three additional weeks.  Pick up stool softner and laxative for home use following surgery while on pain medications. Do not submerge incision under water. Please use good hand washing techniques while changing dressing each day. May shower starting three days after surgery. Please use a clean towel to pat the incision dry following showers. Continue to use ice for pain and swelling after surgery. Do not use any lotions or creams on the incision until instructed by your surgeon.  Wear both TED hose on both legs during the day every day for  three weeks, but may remove the TED hose at night at home.  Postoperative Constipation Protocol  Constipation - defined medically as fewer than three stools per week and severe constipation as less than one stool per week.  One of the most common issues patients have following surgery is constipation.  Even if you have a regular bowel pattern at home, your normal regimen is likely to be disrupted due to multiple reasons following surgery.  Combination of anesthesia, postoperative narcotics, change in appetite and fluid intake all can affect your bowels.  In order to avoid complications following surgery, here are some recommendations in order  to help you during your recovery period.  Colace (docusate) - Pick up an over-the-counter form of Colace or another stool softener and take twice a day as long as you are requiring postoperative pain medications.  Take with a full glass of water daily.  If you experience loose stools or diarrhea, hold the colace until you stool forms back up.  If your symptoms do not get better within 1 week or if they get worse, check with your doctor.  Dulcolax (bisacodyl) - Pick up over-the-counter and take as directed by the product packaging as needed to assist with the movement of your bowels.  Take with a full glass of water.  Use this product as needed if not relieved by Colace only.   MiraLax (polyethylene glycol) - Pick up over-the-counter to have on hand.  MiraLax is a solution that will increase the amount of water in your bowels to assist with bowel movements.  Take as directed and can mix with a glass of water, juice, soda, coffee, or tea.  Take if you go more than two days without a movement. Do not use MiraLax more than once per day. Call your doctor if you are still constipated or irregular after using this medication for 7 days in a row.  If you continue to have problems with postoperative constipation, please contact the office for further assistance and recommendations.  If you experience "the worst abdominal pain ever" or develop nausea or vomiting, please contact the office immediatly for further recommendations for treatment.   Do not sit on low chairs, stoools or toilet seats, as it may be difficult to get up from low surfaces   Complete by:  As directed    Driving restrictions   Complete by:  As directed    No driving until released by the physician.   Follow the hip precautions as taught in Physical Therapy   Complete by:  As directed    Increase activity slowly as tolerated   Complete by:  As directed    Lifting restrictions   Complete by:  As directed    No lifting until released by  the physician.   Patient may shower   Complete by:  As directed    You may shower without a dressing once there is no drainage.  Do not wash over the wound.  If drainage remains, do not shower until drainage stops.   TED hose   Complete by:  As directed    Use stockings (TED hose) for 3 weeks on both leg(s).  You may remove them at night for sleeping.   Weight bearing as tolerated   Complete by:  As directed    Laterality:  left   Extremity:  Lower     Allergies as of 05/23/2017      Reactions   Estradiol Rash   Local rash with  estradiol patches    Med Rec must be completed prior to using this [facility]        Discharge Care Instructions  (From admission, onward)        Start     Ordered   05/23/17 0000  Weight bearing as tolerated    Question Answer Comment  Laterality left   Extremity Lower      05/23/17 0807   05/23/17 0000  Change dressing    Comments:  You may change your dressing dressing daily with sterile 4 x 4 inch gauze dressing and paper tape.  Do not submerge the incision under water.   05/23/17 7425     Follow-up Information    Gaynelle Arabian, MD. Schedule an appointment as soon as possible for a visit on 06/04/2017.   Specialty:  Orthopedic Surgery Contact information: [address] [place] [address] [phone]           Signed: Arlee Muslim, PA-C Orthopaedic Surgery 05/23/2017, 8:12 AM

## 2017-05-23 NOTE — Op Note (Signed)
[name], [name]                ACCOUNT NO.:  [id number]  MEDICAL RECORD NO.:  [id number]  LOCATION:  1093                         FACILITY:  [facility]  PHYSICIAN:  Gaynelle Arabian, M.D.    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  PROCEDURE:  Left hip bearing surface revision.  SURGEON:  Gaynelle Arabian, M.D.  ASSISTANT:  Sherlean Foot, PA-C.  ANESTHESIA:  General.  ESTIMATED BLOOD LOSS:  300.  DRAINS:  Hemovac x1.  COMPLICATIONS:  None.  CONDITION:  Stable to Recovery.  BRIEF CLINICAL NOTE:  Victoria [name] is a 57 year old female who had a left total hip arthroplasty done several years ago with metal on metal.  She has had pain recently and has had abnormality in cobalt chromium levels. She presents now for bearing surface revision.  PROCEDURE IN DETAIL:  After successful administration of general anesthetic, the patient was placed in the right lateral decubitus position with the left side up and held with the hip positioner.  Left lower extremity was isolated from her perineum with plastic drapes and prepped and draped in the usual sterile fashion.  Standard posterolateral incision was made with a #10 blade through subcutaneous tissue to level of fascia lata, which was incised in line with the skin incision.  Sciatic nerve was palpated and protected and then the posterior pseudocapsule was incised to expose the joint.  At this stage, there was no evidence of any metal-stained tissue or any tissue damage. In the joint, there was some metallosis present, but no evidence of any destroyed tissue.  The metal-stained tissue was removed with a rongeur. The hip was dislocated.  This was a 36/+3 femoral head.  The head did not show  any gross wear.  The trunnion also did not show any gross wear on the femoral neck.  The head was removed and the femur was retracted anteriorly to gain acetabular exposure.  Acetabular retractors were placed and the rim of the acetabulum was cleared of soft tissue.  The acetabular liner which was the cobalt chromium liner was removed using the extraction device.  The acetabular shell was stable and was in an excellent position.  There was no damage to the acetabular shell.  There was no obvious damage to the liner either.  We then thoroughly irrigated the acetabular shell and then placed a 36-mm neutral +4 polyethylene Marathon liner.  I did not want to put a ceramic femoral head on her old trunnion, so I placed a 36/+0 cobalt chromium femoral head.  The hip was reduced with the same stability parameters.  The wound was copiously irrigated with saline solution and then the posterior soft tissue structures are reattached to the bone through drill holes.  The wound was again irrigated with saline and then 30 mL of 0.25% Marcaine with epinephrine was injected into the subcutaneous tissues.  The fascia was closed over Hemovac drain with a running #1 Stratafix suture.  The deep subcu was also  closed with running #1 Stratafix suture.  Subcu was closed with interrupted 2-0 and subcuticular running 4-0 Monocryl.  The incision was cleaned and dried. Steri-Strips and a bulky sterile dressing applied.  The drain was hooked to suction.  She was subsequently awakened and transported to Recovery in stable condition.     Gaynelle Arabian, M.D.     FA/MEDQ  D:  05/22/2017  T:  05/23/2017  Job:  [id number]

## 2017-05-27 ENCOUNTER — Other Ambulatory Visit: Payer: Self-pay | Admitting: *Deleted

## 2017-05-27 NOTE — Patient Outreach (Signed)
[facility]) Care Management  05/27/2017  CAMARIA GERALD 1960-06-15 [id number]  Subjective: Telephone call to patient's home / mobile number, spoke with patient, and HIPAA verified.  Discussed [facility] Care Management Focus Plan Transition of care follow up, patient voiced understanding, and is in agreement to follow up.  Patient states is okay, currently at the bank, and requested call back at a later time.     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   Status post Left hip bearing surface revision on 05/22/17.   Patient also has a history of Osteoarthritis and anemia.    Assessment: Received Focus Plan Transition of care referral on 05/27/17.   Transition of care follow up pending patient contact.      Plan: RNCM will call patient for 2nd telephone outreach attempt, transition of care follow up, within 10 business days if no return call.      Darlene H. Annia Friendly, BSN, [place] Management [facility] Telephonic CM Phone: [phone] Fax: [phone] ]

## 2017-05-28 ENCOUNTER — Other Ambulatory Visit: Payer: Self-pay | Admitting: *Deleted

## 2017-05-28 ENCOUNTER — Ambulatory Visit: Payer: Self-pay | Admitting: *Deleted

## 2017-05-28 NOTE — Patient Outreach (Signed)
[place] [facility]) Care Management  05/28/2017  Victoria [name] 16-Nov-1960 [id number]  Subjective: Received voicemail message from [name], states she is returning call, and requested call back. Telephone call to patient's home  / mobile number, no answer, left HIPAA compliant voicemail message, and requested call back.    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   Status post Left hip bearing surface revision on 05/22/17.   Patient also has a history of Osteoarthritis and anemia.    Assessment: Received Focus Plan Transition of care referral on 05/27/17.   Transition of care follow up pending patient contact.      Plan: RNCM will send unsuccessful outreach  letter, [facility] pamphlet, will call patient for 3rd telephone outreach attempt, transition of care follow up, and proceed with case closure, within 10 business days if no return call.     Darlene H. Annia Friendly, BSN, [place] Management [facility] Telephonic CM Phone: [phone] Fax: [phone]

## 2017-05-30 ENCOUNTER — Other Ambulatory Visit: Payer: Self-pay | Admitting: *Deleted

## 2017-05-30 NOTE — Patient Outreach (Signed)
[facility]) Care Management  05/30/2017  Victoria [name] Aug 17, 1960 [id number]   Subjective: Received voicemail message from Ms. Victoria [name], states she is returning call, and requested call back. Telephone call to patient's home / mobile number, spoke with patient, and HIPAA verified.  Discussed [facility] Care Management Focus Plan Transition of care follow up, patient voiced understanding, and is in agreement to follow up.  Patient states she is doing well, has a follow up appointment with surgeon on 06/04/17, home health has completed the start of care visit on 05/25/17, to receive therapy 3 times per week, home health provider has not returned for follow up visit, and patient will call provider today to follow up on next scheduled visit.   Patient states she is able to manage self care and has assistance as needed with activities of daily living / home management.   Patient voices understanding of medical diagnosis, surgery, and treatment plan.  States she is accessing the following [facility] benefits: outpatient pharmacy, hospital indemnity (will file claim if appropriate, verbally given contact number for [employer] [phone] to request itemized bill), has family medical leave act (FMLA) in place, and is in the process of getting FMLA in place for husband to take her to follow up appointments. Patient states she does not have any education material, transition of care, care coordination, disease management, disease monitoring, transportation, community resource, or pharmacy needs at this time.  States she is very appreciative of the follow up and is in agreement to receive [facility] Management information.       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 Patient also  has a history of Osteoarthritisand anemia.     Assessment: Received Focus Plan Transition of care referral on 05/27/17.Transition of care follow up completed, no care management needs, and will proceed with case closure.       Plan:RNCM will send patient successful outreach letter, [facility] pamphlet, and magnet. RNCM will complete case closure due to follow up completed / no care management needs.      Darlene H. Annia Friendly, BSN, [facility] Telephonic CM Phone: [phone] Fax: [phone]

## 2017-06-10 ENCOUNTER — Ambulatory Visit: Payer: Self-pay | Admitting: *Deleted

## 2017-06-24 MED FILL — ALPRAZolam 0.5 MG TABS: 0.5 | 30 days supply | Qty: 30 | Fill #3

## 2017-06-24 MED FILL — PROMETHAZINE 25 MG TABLET: 25 | 10 days supply | Qty: 30 | Fill #1

## 2017-06-25 ENCOUNTER — Other Ambulatory Visit: Payer: Self-pay | Admitting: Internal Medicine

## 2017-06-25 MED FILL — LEVOTHYROXINE 100 MCG TAB: 100 | 90 days supply | Qty: 90 | Fill #0

## 2017-06-27 ENCOUNTER — Encounter (HOSPITAL_COMMUNITY): Payer: Self-pay | Admitting: *Deleted

## 2017-06-28 ENCOUNTER — Telehealth: Payer: Self-pay | Admitting: Emergency Medicine

## 2017-06-28 NOTE — Telephone Encounter (Signed)
Bill to be submitted to law firm for record copying fee

## 2017-06-28 NOTE — Telephone Encounter (Signed)
Faxed medical records to [facility] Charlestine Massed, Judithann Sheen per patients request 06/28/17.

## 2017-06-28 NOTE — Telephone Encounter (Signed)
50 pages long

## 2017-07-03 DIAGNOSIS — M25552 Pain in left hip: Secondary | ICD-10-CM

## 2017-07-12 ENCOUNTER — Encounter (HOSPITAL_COMMUNITY): Payer: Self-pay | Admitting: *Deleted

## 2017-07-12 ENCOUNTER — Other Ambulatory Visit: Payer: Self-pay

## 2017-07-17 ENCOUNTER — Ambulatory Visit (HOSPITAL_COMMUNITY): Payer: No Typology Code available for payment source | Admitting: Registered Nurse

## 2017-07-17 ENCOUNTER — Encounter (HOSPITAL_COMMUNITY): Payer: Self-pay

## 2017-07-17 ENCOUNTER — Telehealth (HOSPITAL_COMMUNITY): Payer: Self-pay | Admitting: *Deleted

## 2017-07-17 ENCOUNTER — Encounter (HOSPITAL_COMMUNITY)
Admission: RE | Disposition: A | Discharge status: Home / Self Care | Payer: Self-pay | Source: Ambulatory Visit | Attending: Orthopedic Surgery

## 2017-07-17 ENCOUNTER — Ambulatory Visit (HOSPITAL_COMMUNITY)
Admission: RE | Admit: 2017-07-17 | Discharge: 2017-07-17 | Disposition: A | Discharge status: Home / Self Care | Payer: No Typology Code available for payment source | Source: Ambulatory Visit | Attending: Orthopedic Surgery | Admitting: Orthopedic Surgery

## 2017-07-17 DIAGNOSIS — Z96642 Presence of left artificial hip joint: Secondary | ICD-10-CM | POA: Diagnosis not present

## 2017-07-17 DIAGNOSIS — M2242 Chondromalacia patellae, left knee: Secondary | ICD-10-CM | POA: Insufficient documentation

## 2017-07-17 DIAGNOSIS — M1990 Unspecified osteoarthritis, unspecified site: Secondary | ICD-10-CM | POA: Insufficient documentation

## 2017-07-17 DIAGNOSIS — M23222 Derangement of posterior horn of medial meniscus due to old tear or injury, left knee: Secondary | ICD-10-CM | POA: Insufficient documentation

## 2017-07-17 DIAGNOSIS — Z86010 Personal history of colonic polyps: Secondary | ICD-10-CM | POA: Insufficient documentation

## 2017-07-17 DIAGNOSIS — M47816 Spondylosis without myelopathy or radiculopathy, lumbar region: Secondary | ICD-10-CM | POA: Diagnosis not present

## 2017-07-17 DIAGNOSIS — Z7982 Long term (current) use of aspirin: Secondary | ICD-10-CM | POA: Diagnosis not present

## 2017-07-17 DIAGNOSIS — D510 Vitamin B12 deficiency anemia due to intrinsic factor deficiency: Secondary | ICD-10-CM | POA: Insufficient documentation

## 2017-07-17 DIAGNOSIS — Z79899 Other long term (current) drug therapy: Secondary | ICD-10-CM | POA: Diagnosis not present

## 2017-07-17 DIAGNOSIS — M23242 Derangement of anterior horn of lateral meniscus due to old tear or injury, left knee: Secondary | ICD-10-CM | POA: Diagnosis not present

## 2017-07-17 DIAGNOSIS — E890 Postprocedural hypothyroidism: Secondary | ICD-10-CM | POA: Diagnosis not present

## 2017-07-17 DIAGNOSIS — K219 Gastro-esophageal reflux disease without esophagitis: Secondary | ICD-10-CM | POA: Insufficient documentation

## 2017-07-17 DIAGNOSIS — S83249A Other tear of medial meniscus, current injury, unspecified knee, initial encounter: Secondary | ICD-10-CM | POA: Diagnosis present

## 2017-07-17 HISTORY — PX: KNEE ARTHROSCOPY WITH MEDIAL MENISECTOMY: SHX5651

## 2017-07-17 LAB — CBC
HCT: 36.4 % (ref 36.0–46.0)
Hemoglobin: 12 g/dL (ref 12.0–15.0)
MCH: 31.2 pg (ref 26.0–34.0)
MCHC: 33 g/dL (ref 30.0–36.0)
MCV: 94.5 fL (ref 78.0–100.0)
Platelets: 282 10*3/uL (ref 150–400)
RBC: 3.85 MIL/uL — ABNORMAL LOW (ref 3.87–5.11)
RDW: 14.3 % (ref 11.5–15.5)
WBC: 3.3 10*3/uL — ABNORMAL LOW (ref 4.0–10.5)

## 2017-07-17 SURGERY — ARTHROSCOPY, KNEE, WITH MEDIAL MENISCECTOMY
Anesthesia: General | Site: Knee | Laterality: Left

## 2017-07-17 MED ORDER — MEPERIDINE HCL 50 MG/ML IJ SOLN: 6.2500 mg | INTRAMUSCULAR | Status: DC | PRN

## 2017-07-17 MED ORDER — METOCLOPRAMIDE HCL 5 MG/ML IJ SOLN: 10.0000 mg | Freq: Once | INTRAMUSCULAR | Status: DC | PRN

## 2017-07-17 MED ORDER — POVIDONE-IODINE 10 % EX SWAB: 2.0000 "application " | Freq: Once | CUTANEOUS | Status: DC

## 2017-07-17 MED ORDER — STERILE WATER FOR IRRIGATION IR SOLN
Status: DC | PRN
  Administered 2017-07-17: 500 mL

## 2017-07-17 MED ORDER — BUPIVACAINE-EPINEPHRINE (PF) 0.25% -1:200000 IJ SOLN
INTRAMUSCULAR | Status: AC
  Filled 2017-07-17: qty 30

## 2017-07-17 MED ORDER — FENTANYL CITRATE (PF) 100 MCG/2ML IJ SOLN
INTRAMUSCULAR | Status: DC | PRN
  Administered 2017-07-17 (×7): 50 ug via INTRAVENOUS

## 2017-07-17 MED ORDER — CEFAZOLIN SODIUM-DEXTROSE 2-4 GM/100ML-% IV SOLN
2.0000 g | INTRAVENOUS | Status: AC
  Administered 2017-07-17: 2 g via INTRAVENOUS
  Filled 2017-07-17: qty 100

## 2017-07-17 MED ORDER — FENTANYL CITRATE (PF) 100 MCG/2ML IJ SOLN
INTRAMUSCULAR | Status: AC
  Filled 2017-07-17: qty 2

## 2017-07-17 MED ORDER — DEXAMETHASONE SODIUM PHOSPHATE 10 MG/ML IJ SOLN
INTRAMUSCULAR | Status: DC | PRN
  Administered 2017-07-17: 10 mg via INTRAVENOUS

## 2017-07-17 MED ORDER — LACTATED RINGERS IR SOLN
Status: DC | PRN
  Administered 2017-07-17: 12000 mL

## 2017-07-17 MED ORDER — LABETALOL HCL 5 MG/ML IV SOLN
INTRAVENOUS | Status: DC | PRN
  Administered 2017-07-17: 5 mg via INTRAVENOUS

## 2017-07-17 MED ORDER — LIDOCAINE HCL (CARDIAC) PF 50 MG/5ML IV SOSY
PREFILLED_SYRINGE | INTRAVENOUS | Status: DC | PRN
  Administered 2017-07-17: 100 mg via INTRAVENOUS

## 2017-07-17 MED ORDER — LABETALOL HCL 5 MG/ML IV SOLN
INTRAVENOUS | Status: AC
  Filled 2017-07-17: qty 4

## 2017-07-17 MED ORDER — HYDROMORPHONE HCL 2 MG PO TABS: 2.0000 mg | ORAL_TABLET | Freq: Four times a day (QID) | ORAL | 0 refills | Status: DC | PRN

## 2017-07-17 MED ORDER — FENTANYL CITRATE (PF) 100 MCG/2ML IJ SOLN
25.0000 ug | INTRAMUSCULAR | Status: DC | PRN
  Administered 2017-07-17 (×2): 50 ug via INTRAVENOUS

## 2017-07-17 MED ORDER — HYDROMORPHONE HCL 1 MG/ML IJ SOLN
0.2500 mg | INTRAMUSCULAR | Status: DC | PRN
  Administered 2017-07-17 (×4): 0.5 mg via INTRAVENOUS

## 2017-07-17 MED ORDER — BUPIVACAINE-EPINEPHRINE 0.25% -1:200000 IJ SOLN
INTRAMUSCULAR | Status: DC | PRN
  Administered 2017-07-17: 30 mL

## 2017-07-17 MED ORDER — ACETAMINOPHEN 10 MG/ML IV SOLN
INTRAVENOUS | Status: AC
  Filled 2017-07-17: qty 100

## 2017-07-17 MED ORDER — HYDROMORPHONE HCL 1 MG/ML IJ SOLN
INTRAMUSCULAR | Status: AC
  Administered 2017-07-17: 0.5 mg via INTRAVENOUS
  Filled 2017-07-17: qty 1

## 2017-07-17 MED ORDER — SCOPOLAMINE 1 MG/3DAYS TD PT72
MEDICATED_PATCH | TRANSDERMAL | Status: DC | PRN
  Administered 2017-07-17: 1 via TRANSDERMAL

## 2017-07-17 MED ORDER — LACTATED RINGERS IV SOLN
INTRAVENOUS | Status: DC
  Administered 2017-07-17: 14:00:00 via INTRAVENOUS

## 2017-07-17 MED ORDER — KETOROLAC TROMETHAMINE 30 MG/ML IJ SOLN
INTRAMUSCULAR | Status: DC | PRN
  Administered 2017-07-17: 30 mg via INTRAVENOUS

## 2017-07-17 MED ORDER — ONDANSETRON HCL 4 MG/2ML IJ SOLN
INTRAMUSCULAR | Status: AC
  Filled 2017-07-17: qty 2

## 2017-07-17 MED ORDER — ONDANSETRON HCL 4 MG/2ML IJ SOLN
INTRAMUSCULAR | Status: DC | PRN
  Administered 2017-07-17: 4 mg via INTRAVENOUS

## 2017-07-17 MED ORDER — CHLORHEXIDINE GLUCONATE 4 % EX LIQD: 60.0000 mL | Freq: Once | CUTANEOUS | Status: DC

## 2017-07-17 MED ORDER — MIDAZOLAM HCL 2 MG/2ML IJ SOLN
INTRAMUSCULAR | Status: AC
  Filled 2017-07-17: qty 2

## 2017-07-17 MED ORDER — DEXAMETHASONE SODIUM PHOSPHATE 10 MG/ML IJ SOLN
INTRAMUSCULAR | Status: AC
  Filled 2017-07-17: qty 1

## 2017-07-17 MED ORDER — ACETAMINOPHEN 10 MG/ML IV SOLN
INTRAVENOUS | Status: DC | PRN
  Administered 2017-07-17: 1000 mg via INTRAVENOUS

## 2017-07-17 MED ORDER — MIDAZOLAM HCL 5 MG/5ML IJ SOLN
INTRAMUSCULAR | Status: DC | PRN
  Administered 2017-07-17: 2 mg via INTRAVENOUS

## 2017-07-17 MED ORDER — PROPOFOL 10 MG/ML IV BOLUS
INTRAVENOUS | Status: AC
  Filled 2017-07-17: qty 20

## 2017-07-17 MED ORDER — PROPOFOL 10 MG/ML IV BOLUS
INTRAVENOUS | Status: DC | PRN
  Administered 2017-07-17: 200 mg via INTRAVENOUS

## 2017-07-17 MED ORDER — KETOROLAC TROMETHAMINE 30 MG/ML IJ SOLN
INTRAMUSCULAR | Status: AC
  Filled 2017-07-17: qty 1

## 2017-07-17 MED ORDER — SCOPOLAMINE 1 MG/3DAYS TD PT72
MEDICATED_PATCH | TRANSDERMAL | Status: AC
  Filled 2017-07-17: qty 1

## 2017-07-17 MED ORDER — LACTATED RINGERS IV SOLN: INTRAVENOUS | Status: DC

## 2017-07-17 MED ORDER — FENTANYL CITRATE (PF) 100 MCG/2ML IJ SOLN
INTRAMUSCULAR | Status: AC
  Administered 2017-07-17: 50 ug via INTRAVENOUS
  Filled 2017-07-17: qty 2

## 2017-07-17 MED ORDER — FENTANYL CITRATE (PF) 250 MCG/5ML IJ SOLN
INTRAMUSCULAR | Status: AC
  Filled 2017-07-17: qty 5

## 2017-07-17 MED FILL — HYDROmorphone HCL 2 MG TABS: 2 | 7 days supply | Qty: 30 | Fill #0

## 2017-07-17 SURGICAL SUPPLY — 29 items
BANDAGE ACE 6X5 VEL STRL LF (GAUZE/BANDAGES/DRESSINGS) ×2 IMPLANT
BLADE 4.2CUDA (BLADE) ×2 IMPLANT
COVER SURGICAL LIGHT HANDLE (MISCELLANEOUS) ×2 IMPLANT
CUFF TOURN SGL QUICK 34 (TOURNIQUET CUFF) ×2
CUFF TRNQT CYL 34X4X40X1 (TOURNIQUET CUFF) ×1 IMPLANT
DRAPE U-SHAPE 47X51 STRL (DRAPES) ×2 IMPLANT
DRSG EMULSION OIL 3X3 NADH (GAUZE/BANDAGES/DRESSINGS) ×2 IMPLANT
DRSG PAD ABDOMINAL 8X10 ST (GAUZE/BANDAGES/DRESSINGS) ×2 IMPLANT
DURAPREP 26ML APPLICATOR (WOUND CARE) ×2 IMPLANT
GAUZE SPONGE 4X4 12PLY STRL (GAUZE/BANDAGES/DRESSINGS) ×2 IMPLANT
GLOVE BIO SURGEON STRL SZ8 (GLOVE) ×2 IMPLANT
GLOVE BIOGEL PI IND STRL 8 (GLOVE) ×1 IMPLANT
GLOVE BIOGEL PI INDICATOR 8 (GLOVE) ×2
GLOVE ECLIPSE 7.5 STRL STRAW (GLOVE) ×1 IMPLANT
GOWN STRL REUS W/ TWL XL LVL3 (GOWN DISPOSABLE) IMPLANT
GOWN STRL REUS W/TWL LRG LVL3 (GOWN DISPOSABLE) ×2 IMPLANT
GOWN STRL REUS W/TWL XL LVL3 (GOWN DISPOSABLE) ×2
KIT BASIN OR (CUSTOM PROCEDURE TRAY) ×2 IMPLANT
MANIFOLD NEPTUNE II (INSTRUMENTS) ×2 IMPLANT
PACK ARTHROSCOPY WL (CUSTOM PROCEDURE TRAY) ×2 IMPLANT
PACK ICE MAXI GEL EZY WRAP (MISCELLANEOUS) ×6 IMPLANT
PAD ABD 8X10 STRL (GAUZE/BANDAGES/DRESSINGS) ×1 IMPLANT
PADDING CAST COTTON 6X4 STRL (CAST SUPPLIES) ×3 IMPLANT
POSITIONER SURGICAL ARM (MISCELLANEOUS) ×2 IMPLANT
SUT ETHILON 4 0 PS 2 18 (SUTURE) ×2 IMPLANT
TOWEL OR 17X26 10 PK STRL BLUE (TOWEL DISPOSABLE) ×2 IMPLANT
TUBING ARTHRO INFLOW-ONLY STRL (TUBING) ×2 IMPLANT
WAND HAND CNTRL MULTIVAC 90 (MISCELLANEOUS) ×1 IMPLANT
WRAP KNEE MAXI GEL POST OP (GAUZE/BANDAGES/DRESSINGS) ×2 IMPLANT

## 2017-07-17 NOTE — Transfer of Care (Signed)
Immediate Anesthesia Transfer of Care Note  Patient: Victoria [name]  Procedure(s) Performed: LEFT KNEE ARTHROSCOPY WITH MEDIAL LATERAL MENISECTOMY (Left Knee)  Patient Location: PACU  Anesthesia Type:General  Level of Consciousness: awake, alert , oriented and patient cooperative  Airway & Oxygen Therapy: Patient Spontanous Breathing and Patient connected to face mask oxygen  Post-op Assessment: Report given to RN, Post -op Vital signs reviewed and stable and Patient moving all extremities X 4  Post vital signs: stable  Last Vitals:  Vitals Value Taken Time  BP 150/99 07/17/2017  4:18 PM  Temp    Pulse 77 07/17/2017  4:20 PM  Resp 16 07/17/2017  4:20 PM  SpO2 100 % 07/17/2017  4:20 PM  Vitals shown include unvalidated device data.  Last Pain:  Vitals:   07/17/17 1425  TempSrc:   PainSc: 5       Patients Stated Pain Goal: 3 (62/[id number])  Complications: No apparent anesthesia complications

## 2017-07-17 NOTE — Anesthesia Preprocedure Evaluation (Signed)
Anesthesia Evaluation  Patient identified by MRN, date of birth, ID band Patient awake    Reviewed: Allergy & Precautions, NPO status , Patient's Chart, lab work & pertinent test results  History of Anesthesia Complications (+) PONV and history of anesthetic complications  Airway Mallampati: II  TM Distance: >3 FB Neck ROM: Full    Dental no notable dental hx. (+) Teeth Intact   Pulmonary neg pulmonary ROS,  Recent + rapid flu test 05/06/2017  Symptoms resolved   Pulmonary exam normal breath sounds clear to auscultation       Cardiovascular Normal cardiovascular exam Rhythm:Regular Rate:Normal  Hx/o Palpitations Echo 04/2016- - Left ventricle: The cavity size was normal. Wall thickness was   normal. Systolic function was normal. The estimated ejection   fraction was in the range of 55% to 60%. Left ventricular   diastolic function parameters were normal.  EKG 03/2016- Sinus bradycardia otherwise normal   Neuro/Psych  Headaches, negative psych ROS   GI/Hepatic Neg liver ROS, GERD  Medicated and Controlled,Hx/o Gastric bypass Hx/o Dumping syndrome   Endo/Other  Hypothyroidism Copper deficiency  Renal/GU negative Renal ROS  negative genitourinary   Musculoskeletal  (+) Arthritis , Osteoarthritis,  Failed Left THR   Abdominal (+) + obese,   Peds  Hematology  (+) anemia ,   Anesthesia Other Findings   Reproductive/Obstetrics                             Anesthesia Physical  Anesthesia Plan  ASA: III  Anesthesia Plan: General   Post-op Pain Management:    Induction: Intravenous  PONV Risk Score and Plan: Ondansetron, Dexamethasone, Scopolamine patch - Pre-op and Midazolam  Airway Management Planned: LMA  Additional Equipment:   Intra-op Plan:   Post-operative Plan: Extubation in OR  Informed Consent: I have reviewed the patients History and Physical, chart, labs and discussed  the procedure including the risks, benefits and alternatives for the proposed anesthesia with the patient or authorized representative who has indicated his/her understanding and acceptance.   Dental advisory given  Plan Discussed with: Anesthesiologist, CRNA and Surgeon  Anesthesia Plan Comments:         Anesthesia Quick Evaluation

## 2017-07-17 NOTE — Discharge Instructions (Signed)
° °Dr. Jeanelle Dake °Total Joint Specialist °[facility] °[address] °[place], [place] [address] °[phone] ° ° °Arthroscopic Procedure, Knee °An arthroscopic procedure can find what is wrong with your knee. °PROCEDURE °Arthroscopy is a surgical technique that allows your orthopedic surgeon to diagnose and treat your knee injury with accuracy. They will look into your knee through a small instrument. This is almost like a small (pencil sized) telescope. Because arthroscopy affects your knee less than open knee surgery, you can anticipate a more rapid recovery. Taking an active role by following your caregiver's instructions will help with rapid and complete recovery. Use crutches, rest, elevation, ice, and knee exercises as instructed. The length of recovery depends on various factors including type of injury, age, physical condition, medical conditions, and your rehabilitation. °Your knee is the joint between the large bones (femur and tibia) in your leg. Cartilage covers these bone ends which are smooth and slippery and allow your knee to bend and move smoothly. Two menisci, thick, semi-lunar shaped pads of cartilage which form a rim inside the joint, help absorb shock and stabilize your knee. Ligaments bind the bones together and support your knee joint. Muscles move the joint, help support your knee, and take stress off the joint itself. Because of this all programs and physical therapy to rehabilitate an injured or repaired knee require rebuilding and strengthening your muscles. °AFTER THE PROCEDURE °· After the procedure, you will be moved to a recovery area until most of the effects of the medication have worn off. Your caregiver will discuss the test results with you.  °· Only take over-the-counter or prescription medicines for pain, discomfort, or fever as directed by your caregiver.  °SEEK MEDICAL CARE IF:  °· You have increased bleeding from your wounds.  °· You see redness,  swelling, or have increasing pain in your wounds.  °· You have pus coming from your wound.  °· You have an oral temperature above 102° F (38.9° C).  °· You notice a bad smell coming from the wound or dressing.  °· You have severe pain with any motion of your knee.  °SEEK IMMEDIATE MEDICAL CARE IF:  °· You develop a rash.  °· You have difficulty breathing.  °· You have any allergic problems.  °FURTHER INSTRUCTIONS:  °· ICE to the affected knee every three hours for 30 minutes at a time and then as needed for pain and swelling.  Continue to use ice on the knee for pain and swelling from surgery. You may notice swelling that will progress down to the foot and ankle.  This is normal after surgery.  Elevate the leg when you are not up walking on it.   ° °DIET °You may resume your previous home diet once your are discharged from the hospital. ° °DRESSING / WOUND CARE / SHOWERING °You may start showering two days after being discharged home but do not submerge the incisions under water.  °Change dressing 48 hours after the procedure and then cover the small incisions with band aids until your follow up visit. °Change the surgical dressings daily and reapply a dry dressing each time.  ° °ACTIVITY °Walk with your walker as instructed. °Use walker as long as suggested by your caregivers. °Avoid periods of inactivity such as sitting longer than an hour when not asleep. This helps prevent blood clots.  °You may resume a sexual relationship in one month or when given the OK by your doctor.  °You may return to   work once you are cleared by your doctor.  °Do not drive a car for 6 weeks or until released by you surgeon.  °Do not drive while taking narcotics. ° °WEIGHT BEARING °Weight bearing as tolerated with assist device (walker, cane, etc) as directed, use it as long as suggested by your surgeon or therapist, typically at least 4-6 weeks. ° °POSTOPERATIVE CONSTIPATION PROTOCOL °Constipation - defined medically as fewer than three  stools per week and severe constipation as less than one stool per week. ° °One of the most common issues patients have following surgery is constipation.  Even if you have a regular bowel pattern at home, your normal regimen is likely to be disrupted due to multiple reasons following surgery.  Combination of anesthesia, postoperative narcotics, change in appetite and fluid intake all can affect your bowels.  In order to avoid complications following surgery, here are some recommendations in order to help you during your recovery period. ° °Colace (docusate) - Pick up an over-the-counter form of Colace or another stool softener and take twice a day as long as you are requiring postoperative pain medications.  Take with a full glass of water daily.  If you experience loose stools or diarrhea, hold the colace until you stool forms back up.  If your symptoms do not get better within 1 week or if they get worse, check with your doctor. ° °Dulcolax (bisacodyl) - Pick up over-the-counter and take as directed by the product packaging as needed to assist with the movement of your bowels.  Take with a full glass of water.  Use this product as needed if not relieved by Colace only.  ° °MiraLax (polyethylene glycol) - Pick up over-the-counter to have on hand.  MiraLax is a solution that will increase the amount of water in your bowels to assist with bowel movements.  Take as directed and can mix with a glass of water, juice, soda, coffee, or tea.  Take if you go more than two days without a movement. °Do not use MiraLax more than once per day. Call your doctor if you are still constipated or irregular after using this medication for 7 days in a row. ° °If you continue to have problems with postoperative constipation, please contact the office for further assistance and recommendations.  If you experience "the worst abdominal pain ever" or develop nausea or vomiting, please contact the office immediatly for further  recommendations for treatment. ° °ITCHING ° If you experience itching with your medications, try taking only a single pain pill, or even half a pain pill at a time.  You can also use Benadryl over the counter for itching or also to help with sleep.  ° °TED HOSE STOCKINGS °Wear the elastic stockings on both legs for three weeks following surgery during the day but you may remove then at night for sleeping. ° °MEDICATIONS °See your medication summary on the “After Visit Summary” that the nursing staff will review with you prior to discharge.  You may have some home medications which will be placed on hold until you complete the course of blood thinner medication.  It is important for you to complete the blood thinner medication as prescribed by your surgeon.  Continue your approved medications as instructed at time of discharge. °Do not drive while taking narcotics.  ° °PRECAUTIONS °If you experience chest pain or shortness of breath - call 911 immediately for transfer to the hospital emergency department.  °If you develop a fever greater that 101   F, purulent drainage from wound, increased redness or drainage from wound, foul odor from the wound/dressing, or calf pain - CONTACT YOUR SURGEON.   °                                                °FOLLOW-UP APPOINTMENTS °Make sure you keep all of your appointments after your operation with your surgeon and caregivers. You should call the office at [phone]  and make an appointment for approximately one week after the date of your surgery or on the date instructed by your surgeon outlined in the "After Visit Summary". ° °RANGE OF MOTION AND STRENGTHENING EXERCISES  °Rehabilitation of the knee is important following a knee injury or an operation. After just a few days of immobilization, the muscles of the thigh which control the knee become weakened and shrink (atrophy). Knee exercises are designed to build up the tone and strength of the thigh muscles and to improve  knee motion. Often times heat used for twenty to thirty minutes before working out will loosen up your tissues and help with improving the range of motion but do not use heat for the first two weeks following surgery. These exercises can be done on a training (exercise) mat, on the floor, on a table or on a bed. Use what ever works the best and is most comfortable for you Knee exercises include: ° °QUAD STRENGTHENING EXERCISES °Strengthening Quadriceps Sets ° °Tighten muscles on top of thigh by pushing knees down into floor or table. °Hold for 20 seconds. Repeat 10 times. °Do 2 sessions per day. ° ° ° ° °Strengthening Terminal Knee Extension ° °With knee bent over bolster, straighten knee by tightening muscle on top of thigh. Be sure to keep bottom of knee on bolster. °Hold for 20 seconds. Repeat 10 times. °Do 2 sessions per day. ° ° °Straight Leg with Bent Knee ° °Lie on back with opposite leg bent. Keep involved knee slightly bent at knee and raise leg 4-6". Hold for 10 seconds. °Repeat 20 times per set. °Do 2 sets per session. °Do 2 sessions per day. ° °

## 2017-07-17 NOTE — Op Note (Signed)
Operative Report- KNEE ARTHROSCOPY  Preoperative diagnosis-  Left knee medial meniscal tear  Postoperative diagnosis Left- knee medial meniscal tear plus  Lateral meniscal tear  Procedure- Left knee arthroscopy with medial and lateral  meniscal debridement    Surgeon- Dione Plover. [name], MD  Anesthesia-General  EBL-  Minimal  Complications- None  Condition- PACU - hemodynamically stable.  Brief clinical note- -Victoria [name] is a 57 y.o.  female with a several month history of Left pain and mechanical symptoms. Exam and history suggested medial  meniscal tear confirmed by MRI. The patient presents now for arthroscopy and debridement   Procedure in detail -       After successful administration of General anesthetic, a tourmiquet is placed high on the Left  thigh and the Left lower extremity is prepped and draped in the usual sterile fashion. Time out is performed by the surgical team. Standard superomedial and inferolateral portal sites are marked and incisions made with an 11 blade. The inflow cannula is passed through the superomedial portal and camera through the inferolateral portal and inflow is initiated. Arthroscopic visualization proceeds.      The undersurface of the patella and trochlea are visualized and there is mild chondromalacia but no unstable defects. The medial and lateral gutters are visualized and there are  no loose bodies. Flexion and valgus force is applied to the knee and the medial compartment is entered. A spinal needle is passed into the joint through the site marked for the inferomedial portal. A small incision is made and the dilator passed into the joint. The findings for the medial compartment are unstable tear of posterior horn of medial meniscus with Grade II chondromalacia medial femoral condyle. . The tear is debrided to a stable base with baskets and a shaver and sealed off with the Arthrocare. The shaver is used to debride the unstable cartilage  to a stable (bony, cartilaginous) base with stable edges. It is probed and found to be stable.    The intercondylar notch is visualized and the ACL appears normal . The lateral compartment is entered and the findings are unstable tear of body and anterior horn of lateral meniscus . The tear is debrided to a stable base with baskets and a shaver and sealed off with the Arthrocare.  It is probed and found to be stable.     The joint is again inspected and there are no other tears, defects or loose bodies identified. The arthroscopic equipment is then removed from the inferior portals which are closed with interrupted 4-0 nylon. 20 ml of .25% Marcaine with epinephrine are injected through the inflow cannula and the cannula is then removed and the portal closed with nylon. The incisions are cleaned and dried and a bulky sterile dressing is applied. The patient is then awakened and transported to recovery in stable condition.   07/17/2017, 4:12 PM

## 2017-07-17 NOTE — Anesthesia Postprocedure Evaluation (Signed)
Anesthesia Post Note  Patient: AWILDA COVIN  Procedure(s) Performed: LEFT KNEE ARTHROSCOPY WITH MEDIAL LATERAL MENISECTOMY (Left Knee)     Patient location during evaluation: PACU Anesthesia Type: General Level of consciousness: awake and alert Pain management: pain level controlled Vital Signs Assessment: post-procedure vital signs reviewed and stable Respiratory status: spontaneous breathing, nonlabored ventilation, respiratory function stable and patient connected to nasal cannula oxygen Cardiovascular status: blood pressure returned to baseline and stable Postop Assessment: no apparent nausea or vomiting Anesthetic complications: no    Last Vitals:  Vitals:   07/17/17 1730 07/17/17 1743  BP: (!) 144/91 125/88  Pulse: 68 72  Resp: 13 17  Temp:  36.9 C  SpO2: 100% 100%    Last Pain:  Vitals:   07/17/17 1743  TempSrc:   PainSc: 3     LLE Motor Response: Purposeful movement (07/17/17 1743)            Montez Hageman

## 2017-07-17 NOTE — Progress Notes (Signed)
Dr Wynelle Link giving prescriptions to spouse.

## 2017-07-17 NOTE — H&P (Signed)
CC- Victoria [name] is a 57 y.o. female who presents with left knee pain.  HPI- . Knee Pain: Patient presents with knee pain involving the  left knee. Onset of the symptoms was several months ago. Inciting event: none known. Current symptoms include giving out, pain located medially, stiffness and swelling. Pain is aggravated by any weight bearing, lateral movements, pivoting, rising after sitting, standing and walking.  Patient has had no prior knee problems. Evaluation to date: MRI: abnormal medial meniscal tear. Treatment to date: rest and cortisone injection.  Past Medical History:  Diagnosis Date  . Anemia    per nicious anemia   . Bilateral cataracts   . Chronic leukopenia followed by dr Alvy Bimler (oncology/hematology)   intermittant since 2010  . Degenerative joint disease of low back 09/02/15    Dr. Maureen Ralphs  . Fibrocystic breast   . Flu 05/06/2017   POSITIVE RAPID FLU TEST , SEE EPIC ENCOUNTER ; DR.BAXLEY ; today [id number] presents reporting resolution flu symtpoms and comepleteion of abx ; afebrile   . GERD (gastroesophageal reflux disease)   . Headache    gets headaches that can last for up to 4 days ; to have MRI on 05-16-17 for eval   . Heart palpitations    SEE EPIC ENCOUTNER , CARDIOLOGY DR. Tressia Miners TURNER 2018; reports on 05-16-17 " i haven't had any bouts of those lately"    . Hemorrhoids   . History of Clostridium difficile infection 12/2010  . History of exercise intolerance    ETT on 04-27-2016-- negative ([facility] treadmill score 7)  . History of Helicobacter pylori infection 2001 and 09/ 2012  . HSV-2 infection    genital  . Hx of adenomatous colonic polyps 10/17/2007  . Hydradenitis    per pt currently no tx  . Hypothyroidism, postsurgical 1980  . Metallosis    s/p left hip replacement 8 years ago   . Osteoarthritis   . PONV (postoperative nausea and vomiting)   . 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 Reactive hypoglycemia endocrinologist-- dr  Dwyane Dee   post gastrectomy dumping syndrome  . Sebaceous cyst    PERIANAL AREA  . Varicose veins   . Vitamin B 12 deficiency   . Vitamin D deficiency     Past Surgical History:  Procedure Laterality Date  . BREATH TEK H PYLORI N/A 05/03/2014   Procedure: BREATH TEK H PYLORI;  Surgeon: Kaylyn Lim, MD;  Location: [facility];  Service: General;  Laterality: N/A;  . CARDIOVASCULAR STRESS TEST  03/11/2013   dr Tressia Miners turner   Low risk nuclear study w/ a very small anterior perfusion defect that most likely represents breast attenuation artifact, less likely this could represent a true perfusion abnormality in the distribution of diagonal branch of the LAD/  normal LV function and wall motion , ef 66%  . CESAREAN SECTION  1985  . CHOLECYSTECTOMY OPEN  1987  . COLONOSCOPY  02/2017   Dr [name] ; per patient they found 7 polyps with polypectomy; on 5 year track   . FINE NEEDLE ASPIRATION Left 05/22/2017   Procedure: LEFT KNEE ASPIRATION;  Surgeon: Gaynelle Arabian, MD;  Location: [facility];  Service: Orthopedics;  Laterality: Left;  Marland Kitchen GASTRIC ROUX-EN-Y N/A 07/06/2014   Procedure: LAPAROSCOPIC ROUX-EN-Y GASTRIC BYPASS, ENTEROLYSIS OF ADHESIONS WITH UPPER ENDOSCOPY;  Surgeon: Johnathan Hausen, MD;  Location: [facility];  Service: General;  Laterality: N/A;  . HAMMER TOE SURGERY Bilateral 02/17/2008   bilateral foot digit's 2 and 5  .  HIATAL HERNIA REPAIR N/A 07/06/2014   Procedure: LAPAROSCOPIC REPAIR OF HIATAL HERNIA;  Surgeon: Johnathan Hausen, MD;  Location: [facility];  Service: General;  Laterality: N/A;  . MASS EXCISION N/A 10/11/2016   Procedure: EXCISION OF PERIANAL MASS;  Surgeon: [name], MD;  Location: [facility];  Service: General;  Laterality: N/A;  . REFRACTIVE SURGERY    . THYROIDECTOMY  1980  . TOTAL ABDOMINAL HYSTERECTOMY  08-25-2002   dr Margaretha Glassing   ovaries retained  . TOTAL HIP ARTHROPLASTY Right 05/21/2012   Procedure: TOTAL HIP ARTHROPLASTY;  Surgeon: Gearlean Alf, MD;   Location: [facility];  Service: Orthopedics;  Laterality: Right;  . TOTAL HIP ARTHROPLASTY Left 01-31-2009  dr Wynelle Link  . TOTAL HIP REVISION Left 05/22/2017   Procedure: Left hip bearing surface and head revision;  Surgeon: Gaynelle Arabian, MD;  Location: [facility];  Service: Orthopedics;  Laterality: Left;  . TRANSTHORACIC ECHOCARDIOGRAM  05/03/2016   ef 55-60%/  trivial MR/  mild TR    Prior to Admission medications   Medication Sig Start Date End Date Taking? Authorizing Provider  ALPRAZolam (XANAX) 0.5 MG tablet TAKE 1 TABLET BY MOUTH NIGHTLY AT BEDTIME Patient taking differently: Take 0.5 mg by mouth once nightly as needed for sleep 12/28/16  Yes Baxley, Cresenciano Lick, MD  cyanocobalamin (,VITAMIN B-12,) 1000 MCG/ML injection Inject 1,000 mcg into the muscle every 30 (thirty) days.   Yes [provider]  cyclobenzaprine (FLEXERIL) 10 MG tablet Take 1 tablet (10 mg total) by mouth 3 (three) times daily as needed for muscle spasms. 05/23/17  Yes Perkins, Alexzandrew L, PA-C  Estradiol (DIVIGEL) 1 MG/GM GEL Place 1 application onto the skin at bedtime.   Yes [provider]  levothyroxine (SYNTHROID, LEVOTHROID) 100 MCG tablet TAKE 1 TABLET BY MOUTH ONCE DAILY 06/25/17  Yes Baxley, Cresenciano Lick, MD  linaclotide (LINZESS) 290 MCG CAPS capsule Take 290 mcg by mouth daily as needed (for constipation).    Yes [provider]  polyethylene glycol (MIRALAX / GLYCOLAX) packet Take 17 g by mouth daily as needed for mild constipation.   Yes [provider]  promethazine (PHENERGAN) 25 MG tablet TAKE 1 TABLET BY MOUTH EVERY 8 HOURS AS NEEDED FOR NAUSEA AND VOMITING 12/28/16  Yes Baxley, Cresenciano Lick, MD  traMADol (ULTRAM) 50 MG tablet Take 1-2 tablets (50-100 mg total) by mouth every 6 (six) hours as needed for moderate pain (use if oxycodone is ineffective for moderate pain). 05/23/17  Yes Perkins, Alexzandrew L, PA-C  aspirin EC 325 MG EC tablet Take 1 tablet (325 mg total) by mouth 2 (two) times  daily. Take a full dose 325 mg enteric coated Aspirin twice a day for three weeks, then reduce to a baby 81 mg Aspirin daily for three additional weeks. Patient not taking: Reported on 07/10/2017 05/23/17   Dara Lords, Alexzandrew L, PA-C  HYDROmorphone (DILAUDID) 2 MG tablet Take 1-2 tablets (2-4 mg total) by mouth every 4 (four) hours as needed for moderate pain or severe pain. Patient not taking: Reported on 07/10/2017 05/23/17   Dara Lords, Alexzandrew L, PA-C  pantoprazole (PROTONIX) 40 MG tablet TAKE 1 TABLET BY MOUTH TWICE DAILY--- PRN Patient taking differently: Take 40 mg by mouth daily as needed (acid reflux).  6/76/72   [name], MD  valACYclovir (VALTREX) 500 MG tablet TAKE 1 TABLET BY MOUTH ONCE DAILY PRN Patient not taking: Reported on 05/07/2017 0/94/70   [name], MD   KNEE EXAM LEFT antalgic gait,  soft tissue tenderness over medial joint line, large effusion, negative drawer sign, collateral ligaments intact  Physical Examination: General appearance - alert, well appearing, and in no distress Mental status - alert, oriented to person, place, and time Chest - clear to auscultation, no wheezes, rales or rhonchi, symmetric air entry Heart - normal rate, regular rhythm, normal S1, S2, no murmurs, rubs, clicks or gallops Abdomen - soft, nontender, nondistended, no masses or organomegaly Neurological - alert, oriented, normal speech, no focal findings or movement disorder noted   Asessment/Plan--- Left knee medial meniscal tear- - Plan left knee arthroscopy with meniscal debridement. Procedure risks and potential comps discussed with patient who elects to proceed. Goals are decreased pain and increased function with a high likelihood of achieving both

## 2017-07-17 NOTE — Anesthesia Procedure Notes (Addendum)
Procedure Name: LMA Insertion Date/Time: 07/17/2017 3:20 PM Performed by: Lissa Morales, CRNA Pre-anesthesia Checklist: Patient identified, Emergency Drugs available, Suction available and Patient being monitored Patient Re-evaluated:Patient Re-evaluated prior to induction Oxygen Delivery Method: Circle system utilized Preoxygenation: Pre-oxygenation with 100% oxygen Induction Type: IV induction LMA: LMA with gastric port inserted LMA Size: 4.0 Tube type: Oral Number of attempts: 1 Airway Equipment and Method: Oral airway Placement Confirmation: positive ETCO2 Tube secured with: Tape Dental Injury: Teeth and Oropharynx as per pre-operative assessment

## 2017-07-18 ENCOUNTER — Encounter (HOSPITAL_COMMUNITY): Payer: Self-pay | Admitting: Orthopedic Surgery

## 2017-07-29 MED FILL — predniSONE 5 MG (21) TBPK: 5 | 6 days supply | Qty: 21 | Fill #0

## 2017-08-20 MED FILL — OXYCODONE-APAP 10-325: 10-325 | 5 days supply | Qty: 15 | Fill #0

## 2017-09-04 ENCOUNTER — Other Ambulatory Visit: Payer: Self-pay | Admitting: Internal Medicine

## 2017-09-04 DIAGNOSIS — E039 Hypothyroidism, unspecified: Secondary | ICD-10-CM

## 2017-09-09 ENCOUNTER — Other Ambulatory Visit: Payer: No Typology Code available for payment source | Admitting: Internal Medicine

## 2017-09-09 DIAGNOSIS — E039 Hypothyroidism, unspecified: Secondary | ICD-10-CM

## 2017-09-10 LAB — TSH: TSH: 2.66 mIU/L (ref 0.40–4.50)

## 2017-09-13 ENCOUNTER — Ambulatory Visit (INDEPENDENT_AMBULATORY_CARE_PROVIDER_SITE_OTHER): Payer: No Typology Code available for payment source | Admitting: Internal Medicine

## 2017-09-13 ENCOUNTER — Encounter: Payer: Self-pay | Admitting: Internal Medicine

## 2017-09-13 VITALS — BP 100/60 | HR 92 | Ht 66.0 in | Wt 223.0 lb

## 2017-09-13 DIAGNOSIS — G8929 Other chronic pain: Secondary | ICD-10-CM | POA: Diagnosis not present

## 2017-09-13 DIAGNOSIS — M25562 Pain in left knee: Secondary | ICD-10-CM | POA: Diagnosis not present

## 2017-09-13 DIAGNOSIS — E039 Hypothyroidism, unspecified: Secondary | ICD-10-CM | POA: Diagnosis not present

## 2017-09-13 DIAGNOSIS — E538 Deficiency of other specified B group vitamins: Secondary | ICD-10-CM

## 2017-09-13 MED ORDER — HYDROCODONE-ACETAMINOPHEN 10-325 MG PO TABS: ORAL_TABLET | ORAL | 0 refills | Status: DC

## 2017-09-13 MED FILL — HYDROCODON-APAP 10-325: 10-325 | 5 days supply | Qty: 30 | Fill #0

## 2017-09-13 NOTE — Progress Notes (Signed)
   Subjective:    Patient ID: Victoria [name], female    DOB: [birth year], 57 y.o.   MRN: [id number]  HPI In May had issues with left knee pain and MRI showed medial meniscal tear which did not respond to cortisone injection.  Subsequently had arthroscopy with meniscal debridement.  Has been slow to improve.  Now apparently able to go back to work half days.  Has had recent issues with low back pain and has been seen by Dr. Nelva Bush.  Has degeneration of lumbar intervertebral disc.  Here today for follow-up on hypothyroidism.  TSH is stable at 2.66.  She has B12 deficiency and gives herself B12 injections monthly.       Review of Systems see above     Objective:   Physical Exam No thyromegaly.  Neck supple.  Chest clear.  No carotid bruits.  Cardiac exam regular rate and rhythm.  Extremities without edema       Assessment & Plan:  Hypothyroidism-stable on current dose of thyroid replacement  B12 deficiency-continue monthly B12 injections  Status post left knee arthroscopy  Lumbar back pain treated by Dr. Dossie Der  Plan: Return in January for physical exam.

## 2017-09-24 MED FILL — LEVOTHYROXINE 100 MCG TAB: 100 | 90 days supply | Qty: 90 | Fill #1

## 2017-09-25 NOTE — Patient Instructions (Signed)
It was a pleasure to see you today.  Continue same dose of thyroid replacement.  Return in January for physical exam.

## 2017-09-30 ENCOUNTER — Encounter: Payer: Self-pay | Admitting: Internal Medicine

## 2017-10-01 ENCOUNTER — Telehealth: Payer: Self-pay | Admitting: Internal Medicine

## 2017-10-01 NOTE — Telephone Encounter (Signed)
Patient called this morning stating that she went to the eye doctor yesterday - [facility] and saw Dr. Kathlen Mody.  Stated that she saw him in January and was advised by [facility] that she didn't need a referral for a yearly evaluation.  However, he wanted her to follow up in 6 months.  So, she called back about a referral since it was a follow up appointment and they gave her referral # [id number].  It is good for 1 year now.  She is going to have to have cataract surgery.  We did receive notes from [facility] on this visit.

## 2017-10-04 ENCOUNTER — Telehealth: Payer: Self-pay | Admitting: Internal Medicine

## 2017-10-04 NOTE — Telephone Encounter (Signed)
Patient called needing referral for Dermatology going to see Drinda Butts at [facility]  The Insurance code is [id number]

## 2017-10-08 ENCOUNTER — Encounter: Payer: Self-pay | Admitting: Internal Medicine

## 2017-10-08 LAB — HM DIABETES EYE EXAM

## 2017-10-10 ENCOUNTER — Encounter: Payer: Self-pay | Admitting: Internal Medicine

## 2017-10-15 MED FILL — METHYLPREDNISOLONE 4 MG TAB: 4 | 6 days supply | Qty: 21 | Fill #0

## 2017-10-27 HISTORY — PX: CATARACT EXTRACTION W/ INTRAOCULAR LENS  IMPLANT, BILATERAL: SHX1307

## 2017-11-05 ENCOUNTER — Other Ambulatory Visit: Payer: Self-pay | Admitting: Internal Medicine

## 2017-11-05 MED FILL — DIVIGEL 1 MG GEL PACKET: 1 | 90 days supply | Qty: 90 | Fill #2

## 2017-11-13 MED FILL — PROMETHAZINE 25 MG TABLET: 25 | 10 days supply | Qty: 30 | Fill #2

## 2017-11-13 MED FILL — ALPRAZolam 0.5 MG TABS: 0.5 | 90 days supply | Qty: 90 | Fill #0

## 2017-11-13 MED FILL — CYCLOBENZAPRINE HCL 10 MG T: 10 | 90 days supply | Qty: 90 | Fill #1

## 2017-11-13 MED FILL — CYANOCOBALAMIN 1,000 MCG/ML: 1000 | 90 days supply | Qty: 3 | Fill #1

## 2017-11-21 MED FILL — DOXYCYCLINE HYCLATE 100 MG: 100 | 14 days supply | Qty: 28 | Fill #0

## 2017-11-25 ENCOUNTER — Telehealth: Payer: Self-pay | Admitting: Internal Medicine

## 2017-11-25 ENCOUNTER — Ambulatory Visit (INDEPENDENT_AMBULATORY_CARE_PROVIDER_SITE_OTHER): Payer: No Typology Code available for payment source | Admitting: Pharmacist

## 2017-11-25 ENCOUNTER — Other Ambulatory Visit: Payer: Self-pay | Admitting: Internal Medicine

## 2017-11-25 DIAGNOSIS — Z79899 Other long term (current) drug therapy: Secondary | ICD-10-CM

## 2017-11-25 MED ORDER — HYDROCODONE-ACETAMINOPHEN 10-325 MG PO TABS: 1.0000 | ORAL_TABLET | Freq: Four times a day (QID) | ORAL | 0 refills | Status: DC | PRN

## 2017-11-25 MED ORDER — ADALIMUMAB 40 MG/0.8ML ~~LOC~~ PSKT: 0.8000 mL | PREFILLED_SYRINGE | SUBCUTANEOUS | 0 refills | Status: DC

## 2017-11-25 MED FILL — HYDROCODON-APAP 10-325: 10-325 | 5 days supply | Qty: 20 | Fill #0

## 2017-11-25 NOTE — Telephone Encounter (Signed)
States she has a boil under her right arm.  She went to see her Teaching laboratory technician at [place].  She was prescribed Doxycycline.  It didn't take care of it.  She said it needs to be lanced.  It's full of puss and blood.  She tried to get in there today and they cannot see her today.  Says it is in the axillary area of her under arm.  Says it's very painful and she knows if she can get it lanced, it will feel better.    Says you have lanced one for her before and so she wanted to check with you and see if you would do it for her since she can't get in at [facility].  She was wanting to see if she could get in the early part of the day because she has another appointment later on today because she just had cataract surgery.    Phone:  [phone]

## 2017-11-25 NOTE — Telephone Encounter (Addendum)
Called Dr. Ledell Peoples office and got an appointment for patient on Tuesday, 10/1 with Dina at 2:20 p.m.    Called patient back and she's very frustrated because she doesn't know if she can get off tomorrow or not.  Doesn't know if she will have the coverage in order to be off tomorrow.  States that they told her to come in last week and she thought they were going to take care of it then.  That's when they put her on the Doxycycline and it didn't touch it.  Now, the pharmacy is trying to get Humira pre-authorized with her insurance and they are trying to get her started on that to try and prevent these from coming back, but that doesn't take care of this one.    So, she would appreciate the pain medication, YES!  And, she said, SHE will call Dr. Ledell Peoples office back.  So, I don't know if she will be going tomorrow or not.    Thank you.     Called in [place] 10/325  #20 to take q 6 hours prn pain. keep appt with Dr. Ledell Peoples office

## 2017-11-25 NOTE — Telephone Encounter (Signed)
I can call her in some Hydrocodone for pain if needed. Please try to call Allyson Sabal for an appt for her.

## 2017-11-25 NOTE — Progress Notes (Signed)
   S: Patient presents to [facility] [place] for review of their specialty medication therapy.  Patient is currently taking Humira for hidradenitis suppurativa. Patient is managed by [place] for this.   Adherence: has not started yet  Efficacy: has not started yet  Dosing:  Hidradenitis suppurativa (Humira only): SubQ: Initial: 160 mg (given as four 40 mg injections on day 1 or given as two 40 mg injections per day over 2 consecutive days), then 80 mg 2 weeks later (day 15). Maintenance: 40 mg every week beginning day 29.  Screening: TB test: completed per patient Hepatitis: completed per patient  Monitoring: S/sx of infection: denies CBC: see below, reports that she has history of low WBC and has been evaluated by heme and told that this was just normal for her. S/sx of hypersensitivity: has not started yet S/sx of malignancy: denies S/sx of heart failure: denies   O:     Lab Results  Component Value Date   WBC 3.3 (L) 07/17/2017   HGB 12.0 07/17/2017   HCT 36.4 07/17/2017   MCV 94.5 07/17/2017   PLT 282 07/17/2017      Chemistry      Component Value Date/Time   NA 137 05/23/2017 0542   K 4.8 05/23/2017 0542   CL 106 05/23/2017 0542   CO2 23 05/23/2017 0542   BUN 9 05/23/2017 0542   CREATININE 0.77 05/23/2017 0542   CREATININE 0.93 03/15/2017 0909      Component Value Date/Time   CALCIUM 8.6 (L) 05/23/2017 0542   ALKPHOS 55 05/16/2017 0901   AST 21 05/16/2017 0901   ALT 21 05/16/2017 0901   BILITOT 0.4 05/16/2017 0901       A/P: 1. Medication review: Patient currently on Humira for hidradenitis suppurativa. Reviewed the medication with the patient, including the following: Humira is a TNF blocking agent indicated for ankylosing spondylitis, Crohn's disease, Hidradenitis suppurativa, psoriatic arthritis, plaque psoriasis, ulcerative colitis, and uveitis. Patient educated on purpose, proper use and potential adverse effects of Humira. The most common  adverse effects are infections, headache, and injection site reactions. There is the possibility of an increased risk of malignancy but it is not well understood if this increased risk is due to there medication or the disease state. There are rare cases of pancytopenia and aplastic anemia. No recommendations for any changes at this time.   Christella Hartigan, PharmD, BCPS, BCACP, CPP Clinical Pharmacist Practitioner  [phone]

## 2017-11-27 ENCOUNTER — Other Ambulatory Visit: Payer: Self-pay | Admitting: Pharmacist

## 2017-11-27 MED ORDER — ADALIMUMAB 80 MG/0.8ML ~~LOC~~ AJKT: 2.0000 "pen " | AUTO-INJECTOR | SUBCUTANEOUS | 0 refills | Status: DC

## 2017-11-27 MED FILL — HUMIRA PEN-CD/UC/HS STARTER: 80 | 28 days supply | Qty: 3 | Fill #0

## 2017-12-20 ENCOUNTER — Other Ambulatory Visit: Payer: Self-pay | Admitting: Pharmacist

## 2017-12-20 MED ORDER — ADALIMUMAB 40 MG/0.4ML ~~LOC~~ AJKT: 40.0000 mg | AUTO-INJECTOR | SUBCUTANEOUS | 5 refills | Status: DC

## 2017-12-20 MED FILL — HUMIRA PEN 40 MG/0.4ML PNKT: 40 | 28 days supply | Qty: 4 | Fill #0

## 2017-12-26 ENCOUNTER — Other Ambulatory Visit: Payer: Self-pay | Admitting: Internal Medicine

## 2017-12-26 MED FILL — LEVOTHYROXINE 100 MCG TAB: 100 | 90 days supply | Qty: 90 | Fill #0

## 2018-01-02 ENCOUNTER — Telehealth: Payer: Self-pay

## 2018-01-02 NOTE — H&P (Signed)
TOTAL KNEE ADMISSION H&P  Patient is being admitted for left total knee arthroplasty.  Subjective:  Chief Complaint:left knee pain.  HPI: Victoria [name], 57 y.o. female, has a history of pain and functional disability in the left knee due to arthritis and has failed non-surgical conservative treatments for greater than 12 weeks to includeNSAID's and/or analgesics, corticosteriod injections and activity modification.  Onset of symptoms was abrupt, starting 10 months ago with gradually worsening course since that time. The patient noted prior procedures on the knee to include  arthroscopy on the left knee(s).  Patient currently rates pain in the left knee(s) at 10 out of 10 with activity. Patient has worsening of pain with activity and weight bearing, joint swelling and instability.  Patient has evidence of near bone-on-bone in the lateral compartment, which is a definite progression from several months ago by imaging studies. There is no active infection.  Patient Active Problem List   Diagnosis Date Noted  . Acute medial meniscal tear 07/17/2017  . 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 Insomnia 11/08/2016  . Pernicious anemia 11/08/2016  . Chronic leukopenia 07/09/2016  . Copper deficiency 07/09/2016  . Heart palpitations 04/12/2016  . 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 Lap gastric bypass May 2016 07/06/2014  . History of Clostridium difficile infection 08/12/2011  . Hypothyroidism, postsurgical 08/26/2010  . GE reflux 08/26/2010  . Vitamin D deficiency 08/26/2010  . Fibrocystic breast disease 08/26/2010  . B12 deficiency 08/26/2010  . OBESITY, UNSPECIFIED 03/20/2010  . BACK PAIN 03/20/2010   Past Medical History:  Diagnosis Date  . Anemia    per nicious anemia   . Bilateral cataracts   . Chronic leukopenia followed by dr Alvy Bimler (oncology/hematology)   intermittant  since 2010  . Degenerative joint disease of low back 09/02/15    Dr. Maureen Ralphs  . Fibrocystic breast   . Flu 05/06/2017   POSITIVE RAPID FLU TEST , SEE EPIC ENCOUNTER ; DR.BAXLEY ; today 3-121-19 presents reporting resolution flu symtpoms and comepleteion of abx ; afebrile   . GERD (gastroesophageal reflux disease)   . Headache    gets headaches that can last for up to 4 days ; to have MRI on 05-16-17 for eval   . Heart palpitations    SEE EPIC ENCOUTNER , CARDIOLOGY DR. Tressia Miners TURNER 2018; reports on 05-16-17 " i haven't had any bouts of those lately"    . Hemorrhoids   . History of Clostridium difficile infection 12/2010  . History of exercise intolerance    ETT on 04-27-2016-- negative ([facility] treadmill score 7)  . History of Helicobacter pylori infection 2001 and 09/ 2012  . HSV-2 infection    genital  . Hx of adenomatous colonic polyps 10/17/2007  . Hydradenitis    per pt currently no tx  . Hypothyroidism, postsurgical 1980  . Metallosis    s/p left hip replacement 8 years ago   . Osteoarthritis   . PONV (postoperative nausea and vomiting)   . 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 Reactive hypoglycemia endocrinologist-- dr Dwyane Dee   post gastrectomy dumping syndrome  . Sebaceous cyst    PERIANAL AREA  . Varicose veins   . Vitamin B 12 deficiency   . Vitamin D deficiency     Past Surgical History:  Procedure Laterality Date  . BREATH TEK H PYLORI N/A 05/03/2014   Procedure: BREATH TEK H PYLORI;  Surgeon: Catalina Antigua  Hassell Done, MD;  Location: [facility];  Service: General;  Laterality: N/A;  . CARDIOVASCULAR STRESS TEST  03/11/2013   dr Tressia Miners turner   Low risk nuclear study w/ a very small anterior perfusion defect that most likely represents breast attenuation artifact, less likely this could represent a true perfusion abnormality in the distribution of diagonal branch of the LAD/  normal LV function and wall motion , ef 66%  . CESAREAN SECTION  1985  . CHOLECYSTECTOMY OPEN   1987  . COLONOSCOPY  02/2017   Dr [name] ; per patient they found 7 polyps with polypectomy; on 5 year track   . FINE NEEDLE ASPIRATION Left 05/22/2017   Procedure: LEFT KNEE ASPIRATION;  Surgeon: Gaynelle Arabian, MD;  Location: [facility];  Service: Orthopedics;  Laterality: Left;  Marland Kitchen GASTRIC ROUX-EN-Y N/A 07/06/2014   Procedure: LAPAROSCOPIC ROUX-EN-Y GASTRIC BYPASS, ENTEROLYSIS OF ADHESIONS WITH UPPER ENDOSCOPY;  Surgeon: Johnathan Hausen, MD;  Location: [facility];  Service: General;  Laterality: N/A;  . HAMMER TOE SURGERY Bilateral 02/17/2008   bilateral foot digit's 2 and 5  . HIATAL HERNIA REPAIR N/A 07/06/2014   Procedure: LAPAROSCOPIC REPAIR OF HIATAL HERNIA;  Surgeon: Johnathan Hausen, MD;  Location: [facility];  Service: General;  Laterality: N/A;  . KNEE ARTHROSCOPY WITH MEDIAL MENISECTOMY Left 07/17/2017   Procedure: LEFT KNEE ARTHROSCOPY WITH MEDIAL LATERAL MENISECTOMY;  Surgeon: Gaynelle Arabian, MD;  Location: [facility];  Service: Orthopedics;  Laterality: Left;  Marland Kitchen MASS EXCISION N/A 10/11/2016   Procedure: EXCISION OF PERIANAL MASS;  Surgeon: [name], MD;  Location: [place];  Service: General;  Laterality: N/A;  . REFRACTIVE SURGERY    . THYROIDECTOMY  1980  . TOTAL ABDOMINAL HYSTERECTOMY  08-25-2002   dr Margaretha Glassing   ovaries retained  . TOTAL HIP ARTHROPLASTY Right 05/21/2012   Procedure: TOTAL HIP ARTHROPLASTY;  Surgeon: Gearlean Alf, MD;  Location: [facility];  Service: Orthopedics;  Laterality: Right;  . TOTAL HIP ARTHROPLASTY Left 01-31-2009  dr Wynelle Link  . TOTAL HIP REVISION Left 05/22/2017   Procedure: Left hip bearing surface and head revision;  Surgeon: Gaynelle Arabian, MD;  Location: [facility];  Service: Orthopedics;  Laterality: Left;  . TRANSTHORACIC ECHOCARDIOGRAM  05/03/2016   ef 55-60%/  trivial MR/  mild TR    No current facility-administered medications for this encounter.    Current Outpatient Medications  Medication Sig Dispense Refill Last Dose  . Adalimumab  (HUMIRA PEN) 40 MG/0.4ML PNKT Inject 40 mg into the skin once a week. Starting on day 29 4 each 5   . ALPRAZolam (XANAX) 0.5 MG tablet TAKE 1 TABLET BY MOUTH AT BEDTIME 90 tablet 1   . cyanocobalamin (,VITAMIN B-12,) 1000 MCG/ML injection Inject 1,000 mcg into the muscle every 30 (thirty) days.   Taking  . cyclobenzaprine (FLEXERIL) 10 MG tablet Take 1 tablet (10 mg total) by mouth 3 (three) times daily as needed for muscle spasms. 90 tablet 0 Taking  . Estradiol (DIVIGEL) 1 MG/GM GEL Place 1 application onto the skin at bedtime.   Taking  . HYDROcodone-acetaminophen (NORCO) 10-325 MG tablet Take 1 tablet by mouth every 6 (six) hours as needed. 20 tablet 0   . HYDROmorphone (DILAUDID) 2 MG tablet Take 1 tablet (2 mg total) by mouth every 6 (six) hours as needed for moderate pain or severe pain. 30 tablet 0 Taking  . levothyroxine (SYNTHROID, LEVOTHROID) 100 MCG tablet TAKE 1 TABLET BY MOUTH ONCE DAILY 90 tablet 1   .  linaclotide (LINZESS) 290 MCG CAPS capsule Take 290 mcg by mouth daily as needed (for constipation).    Taking  . pantoprazole (PROTONIX) 40 MG tablet TAKE 1 TABLET BY MOUTH TWICE DAILY--- PRN (Patient taking differently: Take 40 mg by mouth daily as needed (acid reflux). )   Taking  . polyethylene glycol (MIRALAX / GLYCOLAX) packet Take 17 g by mouth daily as needed for mild constipation.   Taking  . promethazine (PHENERGAN) 25 MG tablet TAKE 1 TABLET BY MOUTH EVERY 8 HOURS AS NEEDED FOR NAUSEA AND VOMITING 30 tablet 5 Taking  . valACYclovir (VALTREX) 500 MG tablet TAKE 1 TABLET BY MOUTH ONCE DAILY PRN   Taking   Allergies  Allergen Reactions  . Estradiol Rash and Other (See Comments)    Local rash with estradiol patches    Social History   Tobacco Use  . Smoking status: Never Smoker  . Smokeless tobacco: Never Used  Substance Use Topics  . Alcohol use: No    Family History  Problem Relation Age of Onset  . Diabetes Father   . Diabetes Sister   . Hypertension Sister   .  Hypertension Mother      Review of Systems  Constitutional: Negative for chills and fever.  HENT: Negative for congestion, sore throat and tinnitus.   Eyes: Negative for double vision, photophobia and pain.  Respiratory: Negative for cough, shortness of breath and wheezing.   Cardiovascular: Negative for chest pain, palpitations and orthopnea.  Gastrointestinal: Negative for heartburn, nausea and vomiting.  Genitourinary: Negative for dysuria, frequency and urgency.  Musculoskeletal: Positive for joint pain.  Neurological: Negative for dizziness, weakness and headaches.    Objective:  Physical Exam  Obese and well developed. General: Alert and oriented x3, cooperative and pleasant, no acute distress. Head: normocephalic, atraumatic, neck supple. Eyes: EOMI. Respiratory: breath sounds clear in all fields, no wheezing, rales, or rhonchi. Cardiovascular: Regular rate and rhythm, no murmurs, gallops or rubs.  Abdomen: non-tender to palpation and soft, normoactive bowel sounds. Musculoskeletal: Significantly antalgic gait pattern favoring the left side without using assisted devices.  Left Knee Exam: Moderate effusion. Range of motion is 0-125 degrees. No crepitus on range of motion of the knee. No medial joint line tenderness. Positive lateral joint line tenderness. Stable knee. Calves soft and nontender. Motor function intact in LE. Strength 5/5 LE bilaterally. Neuro: Distal pulses 2+. Sensation to light touch intact in LE.  Vital signs in last 24 hours: Blood pressure: 116/76 mmHg Pulse: 72 bpm  Labs:   Estimated body mass index is 35.99 kg/m as calculated from the following:   Height as of 09/13/17: 5\' 6"  (1.676 m).   Weight as of 09/13/17: 101.2 kg.   Imaging Review Plain radiographs demonstrate severe degenerative joint disease of the left knee(s). The overall alignment isneutral. The bone quality appears to be adequate for age and reported activity  level.   Preoperative templating of the joint replacement has been completed, documented, and submitted to the Operating Room personnel in order to optimize intra-operative equipment management.   Anticipated LOS equal to or greater than 2 midnights due to - Age 81 and older with one or more of the following:  - Obesity  - Expected need for hospital services (PT, OT, Nursing) required for safe  discharge  - Anticipated need for postoperative skilled nursing care or inpatient rehab  - Active co-morbidities: Cardiac Arrhythmia OR   - Unanticipated findings during/Post Surgery: None  - Patient is a high risk  of re-admission due to: None     Assessment/Plan:  End stage arthritis, left knee   The patient history, physical examination, clinical judgment of the provider and imaging studies are consistent with end stage degenerative joint disease of the left knee(s) and total knee arthroplasty is deemed medically necessary. The treatment options including medical management, injection therapy arthroscopy and arthroplasty were discussed at length. The risks and benefits of total knee arthroplasty were presented and reviewed. The risks due to aseptic loosening, infection, stiffness, patella tracking problems, thromboembolic complications and other imponderables were discussed. The patient acknowledged the explanation, agreed to proceed with the plan and consent was signed. Patient is being admitted for inpatient treatment for surgery, pain control, PT, OT, prophylactic antibiotics, VTE prophylaxis, progressive ambulation and ADL's and discharge planning. The patient is planning to be discharged home with outpatient physical therapy.    Therapy Plans: outpatient therapy at [facility] (may change location due to insurance) Disposition: Home with husband  Planned DVT Prophylaxis: aspirin 325mg  BID DME needed: None PCP: Dr. Emeline General TXA: IV Allergies: None Anesthesia Concerns:  Nausea/vomiting BMI: 37.5  - Patient was instructed on what medications to stop prior to surgery. - Follow-up visit in 2 weeks with Dr. Wynelle Link - Begin physical therapy following surgery - Pre-operative lab work as pre-surgical testing - Prescriptions will be provided in hospital at time of discharge  Theresa Duty, PA-C Orthopedic Surgery [facility] [place]

## 2018-01-02 NOTE — Telephone Encounter (Signed)
Patient called with PT referral number [id number]

## 2018-01-09 ENCOUNTER — Ambulatory Visit (INDEPENDENT_AMBULATORY_CARE_PROVIDER_SITE_OTHER): Payer: No Typology Code available for payment source | Admitting: Obstetrics & Gynecology

## 2018-01-09 ENCOUNTER — Other Ambulatory Visit: Payer: Self-pay

## 2018-01-09 ENCOUNTER — Encounter: Payer: Self-pay | Admitting: Obstetrics & Gynecology

## 2018-01-09 VITALS — BP 100/60 | HR 80 | Resp 16 | Ht 65.5 in | Wt 231.2 lb

## 2018-01-09 DIAGNOSIS — N898 Other specified noninflammatory disorders of vagina: Secondary | ICD-10-CM | POA: Diagnosis not present

## 2018-01-09 DIAGNOSIS — E2839 Other primary ovarian failure: Secondary | ICD-10-CM

## 2018-01-09 DIAGNOSIS — L680 Hirsutism: Secondary | ICD-10-CM

## 2018-01-09 DIAGNOSIS — Z01419 Encounter for gynecological examination (general) (routine) without abnormal findings: Secondary | ICD-10-CM

## 2018-01-09 MED ORDER — ESTRADIOL 1 MG/GM TD GEL: 1.0000 "application " | Freq: Every day | TRANSDERMAL | 4 refills | Status: DC

## 2018-01-09 NOTE — Progress Notes (Signed)
57 y.o. G2P0112 Married Victoria [name] or [place] American female here for annual exam.  Had left hip revision in March and then left knee arthroscopy in May.  Now left knee replacement is scheduled for 01/20/18.    Frustrated with weight but just has not been able to exercise.  Still down ~60 pounds from highest.  Has not communicated with bariatric surgeons.  Recommend she call and also get back with the nutritionist as they really do want her to be successful.  States she knows this is true.  H/o peri-rectal abscess that was excised by Dr. Marcello Moores.  Continuing to having issues.  Diagnosed with hidradenitis.  On Humira and she started this three weeks ago.  Currently has a peri-anal abscess that just ruptured.  Has not been on Humira long enough to know if this is going to help.  Denies vaginal bleeding.  Continues to have vaginal discharge.  Would like further evaluation.  Lastly has some facial hair that is bothersome to her.  Would like evaluation and treatment if appropriate.  No LMP recorded. Patient has had a hysterectomy.          Sexually active: No The current method of family planning is status post hysterectomy.    Exercising: No.   Smoker:  no  Health Maintenance: Pap:  02/11/12 Neg  History of abnormal Pap:  no MMG:  04/26/16 BIRADS1:Neg.  Aware this is due.   Colonoscopy:  03/29/17 polyps. F/u 5 years  BMD:   08/17/08 normal  TDaP:  2013  Pneumonia vaccine(s):  n/a Shingrix:   No Hep C testing: 08/08/15 neg  Screening Labs: PCP   reports that she has never smoked. She has never used smokeless tobacco. She reports that she does not drink alcohol or use drugs.  Past Medical History:  Diagnosis Date  . Anemia    per nicious anemia   . Bilateral cataracts   . Chronic leukopenia followed by dr Alvy Bimler (oncology/hematology)   intermittant since 2010  . Degenerative joint disease of low back 09/02/15    Dr. Maureen Ralphs  . Fibrocystic breast   . Flu 05/06/2017   POSITIVE RAPID FLU TEST ,  SEE EPIC ENCOUNTER ; DR.BAXLEY ; today 3-121-19 presents reporting resolution flu symtpoms and comepleteion of abx ; afebrile   . GERD (gastroesophageal reflux disease)   . Headache    gets headaches that can last for up to 4 days ; to have MRI on 05-16-17 for eval   . Heart palpitations    SEE EPIC ENCOUTNER , CARDIOLOGY DR. Tressia Miners TURNER 2018; reports on 05-16-17 " i haven't had any bouts of those lately"    . Hemorrhoids   . History of Clostridium difficile infection 12/2010  . History of exercise intolerance    ETT on 04-27-2016-- negative ([facility] treadmill score 7)  . History of Helicobacter pylori infection 2001 and 09/ 2012  . HSV-2 infection    genital  . Hx of adenomatous colonic polyps 10/17/2007  . Hydradenitis    per pt currently no tx  . Hypothyroidism, postsurgical 1980  . Metallosis    s/p left hip replacement 8 years ago   . Osteoarthritis   . PONV (postoperative nausea and vomiting)   . 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 Reactive hypoglycemia endocrinologist-- dr Dwyane Dee   post gastrectomy dumping syndrome  . Sebaceous cyst    PERIANAL AREA  . Varicose veins   . Vitamin B 12 deficiency   . Vitamin D deficiency  Past Surgical History:  Procedure Laterality Date  . BREATH TEK H PYLORI N/A 05/03/2014   Procedure: BREATH TEK H PYLORI;  Surgeon: Kaylyn Lim, MD;  Location: [facility];  Service: General;  Laterality: N/A;  . CARDIOVASCULAR STRESS TEST  03/11/2013   dr Tressia Miners turner   Low risk nuclear study w/ a very small anterior perfusion defect that most likely represents breast attenuation artifact, less likely this could represent a true perfusion abnormality in the distribution of diagonal branch of the LAD/  normal LV function and wall motion , ef 66%  . CATARACT EXTRACTION, BILATERAL  10/2017  . CESAREAN SECTION  1985  . CHOLECYSTECTOMY OPEN  1987  . COLONOSCOPY  02/2017   Dr [name] ; per patient they found 7 polyps with polypectomy; on 5 year  track   . FINE NEEDLE ASPIRATION Left 05/22/2017   Procedure: LEFT KNEE ASPIRATION;  Surgeon: Gaynelle Arabian, MD;  Location: [facility];  Service: Orthopedics;  Laterality: Left;  Marland Kitchen GASTRIC ROUX-EN-Y N/A 07/06/2014   Procedure: LAPAROSCOPIC ROUX-EN-Y GASTRIC BYPASS, ENTEROLYSIS OF ADHESIONS WITH UPPER ENDOSCOPY;  Surgeon: Johnathan Hausen, MD;  Location: [facility];  Service: General;  Laterality: N/A;  . HAMMER TOE SURGERY Bilateral 02/17/2008   bilateral foot digit's 2 and 5  . HIATAL HERNIA REPAIR N/A 07/06/2014   Procedure: LAPAROSCOPIC REPAIR OF HIATAL HERNIA;  Surgeon: Johnathan Hausen, MD;  Location: [facility];  Service: General;  Laterality: N/A;  . KNEE ARTHROSCOPY WITH MEDIAL MENISECTOMY Left 07/17/2017   Procedure: LEFT KNEE ARTHROSCOPY WITH MEDIAL LATERAL MENISECTOMY;  Surgeon: Gaynelle Arabian, MD;  Location: [facility];  Service: Orthopedics;  Laterality: Left;  Marland Kitchen MASS EXCISION N/A 10/11/2016   Procedure: EXCISION OF PERIANAL MASS;  Surgeon: [name], MD;  Location: [place];  Service: General;  Laterality: N/A;  . REFRACTIVE SURGERY    . THYROIDECTOMY  1980  . TOTAL ABDOMINAL HYSTERECTOMY  08-25-2002   dr Margaretha Glassing   ovaries retained  . TOTAL HIP ARTHROPLASTY Right 05/21/2012   Procedure: TOTAL HIP ARTHROPLASTY;  Surgeon: Gearlean Alf, MD;  Location: [facility];  Service: Orthopedics;  Laterality: Right;  . TOTAL HIP ARTHROPLASTY Left 01-31-2009  dr Wynelle Link  . TOTAL HIP REVISION Left 05/22/2017   Procedure: Left hip bearing surface and head revision;  Surgeon: Gaynelle Arabian, MD;  Location: [facility];  Service: Orthopedics;  Laterality: Left;  . TRANSTHORACIC ECHOCARDIOGRAM  05/03/2016   ef 55-60%/  trivial MR/  mild TR    Current Outpatient Medications  Medication Sig Dispense Refill  . Adalimumab (HUMIRA PEN) 40 MG/0.4ML PNKT Inject 40 mg into the skin once a week. Starting on day 29 4 each 5  . ALPRAZolam (XANAX) 0.5 MG tablet TAKE 1 TABLET BY MOUTH AT BEDTIME (Patient taking  differently: Take 0.5 mg by mouth at bedtime as needed for anxiety. ) 90 tablet 1  . cyanocobalamin (,VITAMIN B-12,) 1000 MCG/ML injection Inject 1,000 mcg into the muscle every 30 (thirty) days.    . cyclobenzaprine (FLEXERIL) 10 MG tablet Take 1 tablet (10 mg total) by mouth 3 (three) times daily as needed for muscle spasms. 90 tablet 0  . Estradiol (DIVIGEL) 1 MG/GM GEL Place 1 application onto the skin at bedtime. 3 g 4  . HYDROcodone-acetaminophen (NORCO) 10-325 MG tablet Take 1 tablet by mouth every 6 (six) hours as needed. (Patient taking differently: Take 1 tablet by mouth every 6 (six) hours as needed for moderate pain. ) 20 tablet 0  . levothyroxine (  SYNTHROID, LEVOTHROID) 100 MCG tablet TAKE 1 TABLET BY MOUTH ONCE DAILY (Patient taking differently: Take 100 mcg by mouth daily before breakfast. ) 90 tablet 1  . linaclotide (LINZESS) 290 MCG CAPS capsule Take 290 mcg by mouth daily as needed (for constipation).     . pantoprazole (PROTONIX) 40 MG tablet TAKE 1 TABLET BY MOUTH TWICE DAILY--- PRN (Patient taking differently: Take 40 mg by mouth daily as needed (acid reflux). )    . polyethylene glycol (MIRALAX / GLYCOLAX) packet Take 17 g by mouth daily as needed for mild constipation.    . promethazine (PHENERGAN) 25 MG tablet TAKE 1 TABLET BY MOUTH EVERY 8 HOURS AS NEEDED FOR NAUSEA AND VOMITING (Patient taking differently: Take 25 mg by mouth every 8 (eight) hours as needed for nausea or vomiting. ) 30 tablet 5  . valACYclovir (VALTREX) 500 MG tablet TAKE 1 TABLET BY MOUTH ONCE DAILY PRN (Patient taking differently: Take 500 mg by mouth daily as needed (outbreaks). TAKE 1 TABLET BY MOUTH ONCE DAILY PRN)     No current facility-administered medications for this visit.     Family History  Problem Relation Age of Onset  . Diabetes Father   . Diabetes Sister   . Hypertension Sister   . Hypertension Mother     Review of Systems  Constitutional:       Weight gain   Cardiovascular:  Positive for leg swelling.  Gastrointestinal: Positive for nausea.  Genitourinary: Positive for vaginal discharge.       Vaginal itching   Musculoskeletal: Positive for myalgias.  Skin:       Hair loss  Excess hair growth   Neurological: Positive for headaches.  All other systems reviewed and are negative.   Exam:   BP 100/60 (BP Location: Right Arm, Patient Position: Sitting, Cuff Size: Large)   Pulse 80   Resp 16   Ht 5' 5.5" (1.664 m)   Wt 231 lb 3.2 oz (104.9 kg)   BMI 37.89 kg/m  Weight gain: 13# Height: 5' 5.5" (166.4 cm)  Ht Readings from Last 3 Encounters:  01/09/18 5' 5.5" (1.664 m)  09/13/17 5\' 6"  (1.676 m)  07/17/17 5\' 6"  (1.676 m)    General appearance: alert, cooperative and appears stated age Head: Normocephalic, without obvious abnormality, atraumatic Neck: no adenopathy, supple, symmetrical, trachea midline and thyroid normal to inspection and palpation Lungs: clear to auscultation bilaterally Breasts: normal appearance, no masses or tenderness Heart: regular rate and rhythm Abdomen: soft, non-tender; bowel sounds normal; no masses,  no organomegaly Extremities: extremities normal, atraumatic, no cyanosis or edema Skin: Skin color, texture, turgor normal. No rashes or lesions Lymph nodes: Cervical, supraclavicular, and axillary nodes normal. No abnormal inguinal nodes palpated Neurologic: Grossly normal   Pelvic: External genitalia:  no lesions              Urethra:  normal appearing urethra with no masses, tenderness or lesions              Bartholins and Skenes: normal                 Vagina: normal appearing vagina with normal color and discharge, no lesions              Cervix: absent              Pap taken: No. Bimanual Exam:  Uterus:  uterus absent              Adnexa: no  mass, fullness, tenderness               Rectovaginal: Confirms               Anus:  normal sphincter tone, healing and non draining lesion noted, non tender and very small  opening  Chaperone was present for exam.  A:  Well Woman with normal exam H/o gastric bypass Hypothyroidism H/O Vit D deficiency H/o pernicious anemia H/O C diff colitis Vasomotor symptoms, on divigel H/O HSV Hidradenitis H/O TAH 6/04 Increased vaginal discharge  P:   Mammogram guidelines reviewed pap smear not indicated Encouraged follow up with bariatric surgeon and nutritionist RF divigel one packet daily.  #90 day supply/4RF Vaginitis testing obtained today BMD is ordered today Colonoscopy is UTD Lab work UTD Testosterone levels obtained today as well. return annually or prn

## 2018-01-11 LAB — NUSWAB VAGINITIS PLUS (VG+)
Candida albicans, NAA: NEGATIVE
Candida glabrata, NAA: NEGATIVE
Chlamydia trachomatis, NAA: NEGATIVE
Neisseria gonorrhoeae, NAA: NEGATIVE
Trich vag by NAA: NEGATIVE

## 2018-01-12 LAB — TESTT+TESTF+SHBG
Sex Hormone Binding: 59.4 nmol/L (ref 17.3–125.0)
Testosterone, Free: 1 pg/mL (ref 0.0–4.2)
Testosterone, total: 19.5 ng/dL

## 2018-01-13 ENCOUNTER — Other Ambulatory Visit: Payer: Self-pay | Admitting: *Deleted

## 2018-01-13 DIAGNOSIS — E2839 Other primary ovarian failure: Secondary | ICD-10-CM

## 2018-01-13 DIAGNOSIS — N951 Menopausal and female climacteric states: Secondary | ICD-10-CM

## 2018-01-13 DIAGNOSIS — L680 Hirsutism: Secondary | ICD-10-CM

## 2018-01-13 MED ORDER — SPIRONOLACTONE 25 MG PO TABS: 25.0000 mg | ORAL_TABLET | Freq: Two times a day (BID) | ORAL | 3 refills | Status: DC

## 2018-01-13 MED ORDER — ESTRADIOL 0.1 MG/GM VA CREA: 1.0000 | TOPICAL_CREAM | VAGINAL | 1 refills | Status: DC

## 2018-01-13 MED FILL — ESTRADIOL 0.1 MG/GM CRM: 0.1 | 90 days supply | Qty: 43 | Fill #0

## 2018-01-13 MED FILL — SPIRONOLACTONE 25 MG TABS: 25 | 15 days supply | Qty: 60 | Fill #0

## 2018-01-16 ENCOUNTER — Encounter (HOSPITAL_COMMUNITY): Payer: Self-pay

## 2018-01-16 NOTE — Patient Instructions (Addendum)
1960-07-06    Your procedure is scheduled on:  01-20-2018   Report to [facility] Main  Entrance,  Report to admitting at  10:00 AM    Call this number if you have problems the morning of surgery [phone]        Remember: Do not eat food After Midnight.  May have clear liquid diet from midnight before surgery until 6:30 AM day of surgery.  Nothing by mouth after 6:30 AM including water,candy,gum,mints                                      BRUSH YOUR TEETH MORNING OF SURGERY AND RINSE YOUR MOUTH OUT.          Take these medicines the morning of surgery with A SIP OF WATER:  Levothyroxine(synthroid),  Pantoprazole(protonix),  Hydrocodone if needed                                   You may not have any metal on your body including hair pins and               piercings  Do not wear jewelry, make-up, lotions, powders or perfumes, deodorant              Do not wear nail polish.  Do not shave  48 hours prior to surgery.                     Do not bring valuables to the hospital. [place].  Contacts, dentures or bridgework may not be worn into surgery.  Leave suitcase in the car. After surgery it may be brought to your room.      _____________________________________________________________________      CLEAR LIQUID DIET   Foods Allowed                                                                     Foods Excluded  Coffee and tea, regular and decaf                             liquids that you cannot  Plain Jell-O in any flavor                                             see through such as: Fruit ices (not with fruit pulp)                                     milk, soups, orange juice  Iced Popsicles  All solid food Carbonated beverages, regular and diet                                    Cranberry, grape and apple juices Sports drinks like Gatorade Lightly  seasoned clear broth or consume(fat free) Sugar, honey syrup  Sample Menu Breakfast                                Lunch                                     Supper Cranberry juice                    Beef broth                            Chicken broth Jell-O                                     Grape juice                           Apple juice Coffee or tea                        Jell-O                                      Popsicle                                                Coffee or tea                        Coffee or tea  _____________________________________________________________________            [facility] - Preparing for Surgery Before surgery, you can play an important role.  Because skin is not sterile, your skin needs to be as free of germs as possible.  You can reduce the number of germs on your skin by washing with CHG (chlorahexidine gluconate) soap before surgery.  CHG is an antiseptic cleaner which kills germs and bonds with the skin to continue killing germs even after washing. Please DO NOT use if you have an allergy to CHG or antibacterial soaps.  If your skin becomes reddened/irritated stop using the CHG and inform your nurse when you arrive at Short Stay. Do not shave (including legs and underarms) for at least 48 hours prior to the first CHG shower.  You may shave your face/neck. Please follow these instructions carefully:  1.  Shower with CHG Soap the night before surgery and the  morning of Surgery.  2.  If you choose to wash your hair, wash your hair first as usual with your  normal  shampoo.  3.  After you shampoo, rinse your hair and body thoroughly to remove the  shampoo.  4.  Use CHG as you would any other liquid soap.  You can apply chg directly  to the skin and wash                       Gently with a scrungie or clean washcloth.  5.  Apply the CHG Soap to your body ONLY FROM THE NECK DOWN.   Do not use on face/ open                            Wound or open sores. Avoid contact with eyes, ears mouth and genitals (private parts).                       Wash face,  Genitals (private parts) with your normal soap.             6.  Wash thoroughly, paying special attention to the area where your surgery  will be performed.  7.  Thoroughly rinse your body with warm water from the neck down.  8.  DO NOT shower/wash with your normal soap after using and rinsing off  the CHG Soap.             9.  Pat yourself dry with a clean towel.            10.  Wear clean pajamas.            11.  Place clean sheets on your bed the night of your first shower and do not  sleep with pets. Day of Surgery : Do not apply any lotions/deodorants the morning of surgery.  Please wear clean clothes to the hospital/surgery center.  FAILURE TO FOLLOW THESE INSTRUCTIONS MAY RESULT IN THE CANCELLATION OF YOUR SURGERY PATIENT SIGNATURE_________________________________  NURSE SIGNATURE__________________________________  ________________________________________________________________________   Victoria [name]  An incentive spirometer is a tool that can help keep your lungs clear and active. This tool measures how well you are filling your lungs with each breath. Taking long deep breaths may help reverse or decrease the chance of developing breathing (pulmonary) problems (especially infection) following:  A long period of time when you are unable to move or be active. BEFORE THE PROCEDURE   If the spirometer includes an indicator to show your best effort, your nurse or respiratory therapist will set it to a desired goal.  If possible, sit up straight or lean slightly forward. Try not to slouch.  Hold the incentive spirometer in an upright position. INSTRUCTIONS FOR USE  1. Sit on the edge of your bed if possible, or sit up as far as you can in bed or on a chair. 2. Hold the incentive spirometer in an upright position. 3. Breathe out  normally. 4. Place the mouthpiece in your mouth and seal your lips tightly around it. 5. Breathe in slowly and as deeply as possible, raising the piston or the ball toward the top of the column. 6. Hold your breath for 3-5 seconds or for as long as possible. Allow the piston or ball to fall to the bottom of the column. 7. Remove the mouthpiece from your mouth and breathe out normally. 8. Rest for a few seconds and repeat Steps 1 through 7 at least 10 times every 1-2 hours when you are awake. Take your time and take a few normal breaths between deep breaths. 9. The spirometer may include an indicator to show your best  effort. Use the indicator as a goal to work toward during each repetition. 10. After each set of 10 deep breaths, practice coughing to be sure your lungs are clear. If you have an incision (the cut made at the time of surgery), support your incision when coughing by placing a pillow or rolled up towels firmly against it. Once you are able to get out of bed, walk around indoors and cough well. You may stop using the incentive spirometer when instructed by your caregiver.  RISKS AND COMPLICATIONS  Take your time so you do not get dizzy or light-headed.  If you are in pain, you may need to take or ask for pain medication before doing incentive spirometry. It is harder to take a deep breath if you are having pain. AFTER USE  Rest and breathe slowly and easily.  It can be helpful to keep track of a log of your progress. Your caregiver can provide you with a simple table to help with this. If you are using the spirometer at home, follow these instructions: [place] IF:   You are having difficultly using the spirometer.  You have trouble using the spirometer as often as instructed.  Your pain medication is not giving enough relief while using the spirometer.  You develop fever of 100.5 F (38.1 C) or higher. SEEK IMMEDIATE MEDICAL CARE IF:   You cough up bloody sputum  that had not been present before.  You develop fever of 102 F (38.9 C) or greater.  You develop worsening pain at or near the incision site. MAKE SURE YOU:   Understand these instructions.  Will watch your condition.  Will get help right away if you are not doing well or get worse. Document Released: 06/25/2006 Document Revised: 05/07/2011 Document Reviewed: 08/26/2006 Exit[employer] Patient Information 2014 Exit[employer], [place].   ________________________________________________________________________  WHAT IS A BLOOD TRANSFUSION? Blood Transfusion Information  A transfusion is the replacement of blood or some of its parts. Blood is made up of multiple cells which provide different functions.  Red blood cells carry oxygen and are used for blood loss replacement.  White blood cells fight against infection.  Platelets control bleeding.  Plasma helps clot blood.  Other blood products are available for specialized needs, such as hemophilia or other clotting disorders. BEFORE THE TRANSFUSION  Who gives blood for transfusions?   Healthy volunteers who are fully evaluated to make sure their blood is safe. This is blood bank blood. Transfusion therapy is the safest it has ever been in the practice of medicine. Before blood is taken from a donor, a complete history is taken to make sure that person has no history of diseases nor engages in risky social behavior (examples are intravenous drug use or sexual activity with multiple partners). The donor's travel history is screened to minimize risk of transmitting infections, such as malaria. The donated blood is tested for signs of infectious diseases, such as HIV and hepatitis. The blood is then tested to be sure it is compatible with you in order to minimize the chance of a transfusion reaction. If you or a relative donates blood, this is often done in anticipation of surgery and is not appropriate for emergency situations. It takes many days to  process the donated blood. RISKS AND COMPLICATIONS Although transfusion therapy is very safe and saves many lives, the main dangers of transfusion include:   Getting an infectious disease.  Developing a transfusion reaction. This is an allergic reaction to something in  the blood you were given. Every precaution is taken to prevent this. The decision to have a blood transfusion has been considered carefully by your caregiver before blood is given. Blood is not given unless the benefits outweigh the risks. AFTER THE TRANSFUSION  Right after receiving a blood transfusion, you will usually feel much better and more energetic. This is especially true if your red blood cells have gotten low (anemic). The transfusion raises the level of the red blood cells which carry oxygen, and this usually causes an energy increase.  The nurse administering the transfusion will monitor you carefully for complications. HOME CARE INSTRUCTIONS  No special instructions are needed after a transfusion. You may find your energy is better. Speak with your caregiver about any limitations on activity for underlying diseases you may have. SEEK MEDICAL CARE IF:   Your condition is not improving after your transfusion.  You develop redness or irritation at the intravenous (IV) site. SEEK IMMEDIATE MEDICAL CARE IF:  Any of the following symptoms occur over the next 12 hours:  Shaking chills.  You have a temperature by mouth above 102 F (38.9 C), not controlled by medicine.  Chest, back, or muscle pain.  People around you feel you are not acting correctly or are confused.  Shortness of breath or difficulty breathing.  Dizziness and fainting.  You get a rash or develop hives.  You have a decrease in urine output.  Your urine turns a dark color or changes to pink, red, or [place]. Any of the following symptoms occur over the next 10 days:  You have a temperature by mouth above 102 F (38.9 C), not controlled by  medicine.  Shortness of breath.  Weakness after normal activity.  The white part of the eye turns yellow (jaundice).  You have a decrease in the amount of urine or are urinating less often.  Your urine turns a dark color or changes to pink, red, or [place]. Document Released: 02/10/2000 Document Revised: 05/07/2011 Document Reviewed: 09/29/2007 [facility] Patient Information 2014 [place], [place].  _______________________________________________________________________

## 2018-01-17 ENCOUNTER — Encounter (HOSPITAL_COMMUNITY)
Admission: RE | Admit: 2018-01-17 | Discharge: 2018-01-17 | Disposition: A | Discharge status: Home / Self Care | Payer: No Typology Code available for payment source | Source: Ambulatory Visit | Attending: Orthopedic Surgery | Admitting: Orthopedic Surgery

## 2018-01-17 ENCOUNTER — Encounter (HOSPITAL_COMMUNITY): Payer: Self-pay

## 2018-01-17 ENCOUNTER — Other Ambulatory Visit: Payer: Self-pay

## 2018-01-17 DIAGNOSIS — M1712 Unilateral primary osteoarthritis, left knee: Secondary | ICD-10-CM

## 2018-01-17 DIAGNOSIS — Z01812 Encounter for preprocedural laboratory examination: Secondary | ICD-10-CM

## 2018-01-17 LAB — APTT: aPTT: 32 seconds (ref 24–36)

## 2018-01-17 LAB — COMPREHENSIVE METABOLIC PANEL
ALT: 27 U/L (ref 0–44)
AST: 25 U/L (ref 15–41)
Albumin: 4.1 g/dL (ref 3.5–5.0)
Alkaline Phosphatase: 66 U/L (ref 38–126)
Anion gap: 8 (ref 5–15)
BUN: 19 mg/dL (ref 6–20)
CO2: 27 mmol/L (ref 22–32)
Calcium: 9.4 mg/dL (ref 8.9–10.3)
Chloride: 107 mmol/L (ref 98–111)
Creatinine, Ser: 0.87 mg/dL (ref 0.44–1.00)
GFR calc Af Amer: >60 mL/min (ref >60)
GFR calc non Af Amer: >60 mL/min (ref >60)
Glucose, Bld: 89 mg/dL (ref 70–99)
Potassium: 4.5 mmol/L (ref 3.5–5.1)
Sodium: 142 mmol/L (ref 135–145)
Total Bilirubin: 0.8 mg/dL (ref 0.3–1.2)
Total Protein: 8.1 g/dL (ref 6.5–8.1)

## 2018-01-17 LAB — CBC
HCT: 37.9 % (ref 36.0–46.0)
Hemoglobin: 11.9 g/dL — ABNORMAL LOW (ref 12.0–15.0)
MCH: 29.8 pg (ref 26.0–34.0)
MCHC: 31.4 g/dL (ref 30.0–36.0)
MCV: 95 fL (ref 80.0–100.0)
Platelets: 243 10*3/uL (ref 150–400)
RBC: 3.99 MIL/uL (ref 3.87–5.11)
RDW: 15.1 % (ref 11.5–15.5)
WBC: 3.4 10*3/uL — ABNORMAL LOW (ref 4.0–10.5)
nRBC: 0 % (ref 0.0–0.2)

## 2018-01-17 LAB — SURGICAL PCR SCREEN
MRSA, PCR: NEGATIVE
Staphylococcus aureus: NEGATIVE

## 2018-01-17 LAB — PROTIME-INR
INR: 0.98
Prothrombin Time: 12.8 seconds (ref 11.4–15.2)

## 2018-01-17 NOTE — Progress Notes (Signed)
Surgical clearance from dr baxley dated 11-19-2017 with chart.

## 2018-01-19 MED ORDER — BUPIVACAINE LIPOSOME 1.3 % IJ SUSP
20.0000 mL | INTRAMUSCULAR | Status: DC
  Filled 2018-01-19: qty 20

## 2018-01-20 ENCOUNTER — Inpatient Hospital Stay (HOSPITAL_COMMUNITY)
Admission: RE | Admit: 2018-01-20 | Discharge: 2018-01-21 | DRG: 470 | Disposition: A | Discharge status: Home / Self Care | Payer: No Typology Code available for payment source | Source: Ambulatory Visit | Attending: Orthopedic Surgery | Admitting: Orthopedic Surgery

## 2018-01-20 ENCOUNTER — Other Ambulatory Visit: Payer: Self-pay

## 2018-01-20 ENCOUNTER — Encounter (HOSPITAL_COMMUNITY): Payer: Self-pay | Admitting: *Deleted

## 2018-01-20 ENCOUNTER — Inpatient Hospital Stay (HOSPITAL_COMMUNITY): Payer: No Typology Code available for payment source | Admitting: Anesthesiology

## 2018-01-20 ENCOUNTER — Encounter (HOSPITAL_COMMUNITY)
Admission: RE | Disposition: A | Discharge status: Home / Self Care | Payer: Self-pay | Source: Ambulatory Visit | Attending: Orthopedic Surgery

## 2018-01-20 DIAGNOSIS — Z79818 Long term (current) use of other agents affecting estrogen receptors and estrogen levels: Secondary | ICD-10-CM

## 2018-01-20 DIAGNOSIS — F419 Anxiety disorder, unspecified: Secondary | ICD-10-CM | POA: Diagnosis present

## 2018-01-20 DIAGNOSIS — M65862 Other synovitis and tenosynovitis, left lower leg: Secondary | ICD-10-CM | POA: Diagnosis present

## 2018-01-20 DIAGNOSIS — M1710 Unilateral primary osteoarthritis, unspecified knee: Secondary | ICD-10-CM | POA: Diagnosis present

## 2018-01-20 DIAGNOSIS — K219 Gastro-esophageal reflux disease without esophagitis: Secondary | ICD-10-CM | POA: Diagnosis present

## 2018-01-20 DIAGNOSIS — Z79899 Other long term (current) drug therapy: Secondary | ICD-10-CM

## 2018-01-20 DIAGNOSIS — Z9884 Bariatric surgery status: Secondary | ICD-10-CM

## 2018-01-20 DIAGNOSIS — Z96653 Presence of artificial knee joint, bilateral: Secondary | ICD-10-CM | POA: Diagnosis present

## 2018-01-20 DIAGNOSIS — Z888 Allergy status to other drugs, medicaments and biological substances status: Secondary | ICD-10-CM | POA: Diagnosis not present

## 2018-01-20 DIAGNOSIS — E890 Postprocedural hypothyroidism: Secondary | ICD-10-CM | POA: Diagnosis present

## 2018-01-20 DIAGNOSIS — M1712 Unilateral primary osteoarthritis, left knee: Secondary | ICD-10-CM | POA: Diagnosis present

## 2018-01-20 DIAGNOSIS — M179 Osteoarthritis of knee, unspecified: Secondary | ICD-10-CM | POA: Diagnosis present

## 2018-01-20 HISTORY — PX: TOTAL KNEE ARTHROPLASTY: SHX125

## 2018-01-20 LAB — TYPE AND SCREEN
ABO/RH(D): A POS
Antibody Screen: NEGATIVE

## 2018-01-20 SURGERY — ARTHROPLASTY, KNEE, TOTAL
Anesthesia: General | Site: Knee | Laterality: Left

## 2018-01-20 MED ORDER — OXYCODONE HCL 5 MG PO TABS: 5.0000 mg | ORAL_TABLET | Freq: Once | ORAL | Status: DC | PRN

## 2018-01-20 MED ORDER — TRANEXAMIC ACID-NACL 1000-0.7 MG/100ML-% IV SOLN
1000.0000 mg | INTRAVENOUS | Status: AC
  Administered 2018-01-20: 1000 mg via INTRAVENOUS
  Filled 2018-01-20: qty 100

## 2018-01-20 MED ORDER — ACETAMINOPHEN 10 MG/ML IV SOLN
1000.0000 mg | Freq: Once | INTRAVENOUS | Status: AC
  Administered 2018-01-20: 1000 mg via INTRAVENOUS
  Filled 2018-01-20: qty 100

## 2018-01-20 MED ORDER — LEVOTHYROXINE SODIUM 100 MCG PO TABS
100.0000 ug | ORAL_TABLET | Freq: Every day | ORAL | Status: DC
  Administered 2018-01-21: 100 ug via ORAL
  Filled 2018-01-20: qty 1

## 2018-01-20 MED ORDER — FENTANYL CITRATE (PF) 100 MCG/2ML IJ SOLN
INTRAMUSCULAR | Status: AC
  Filled 2018-01-20: qty 2

## 2018-01-20 MED ORDER — ONDANSETRON HCL 4 MG/2ML IJ SOLN: 4.0000 mg | Freq: Four times a day (QID) | INTRAMUSCULAR | Status: DC | PRN

## 2018-01-20 MED ORDER — SODIUM CHLORIDE 0.9 % IV SOLN
INTRAVENOUS | Status: DC
  Administered 2018-01-20 – 2018-01-21 (×2): via INTRAVENOUS

## 2018-01-20 MED ORDER — POLYETHYLENE GLYCOL 3350 17 G PO PACK
17.0000 g | PACK | Freq: Every day | ORAL | Status: DC | PRN
  Administered 2018-01-20: 17 g via ORAL
  Filled 2018-01-20: qty 1

## 2018-01-20 MED ORDER — OXYCODONE HCL 5 MG/5ML PO SOLN
5.0000 mg | Freq: Once | ORAL | Status: DC | PRN
  Filled 2018-01-20: qty 5

## 2018-01-20 MED ORDER — METHOCARBAMOL 500 MG IVPB - SIMPLE MED
INTRAVENOUS | Status: AC
  Filled 2018-01-20: qty 50

## 2018-01-20 MED ORDER — ONDANSETRON HCL 4 MG PO TABS: 4.0000 mg | ORAL_TABLET | Freq: Four times a day (QID) | ORAL | Status: DC | PRN

## 2018-01-20 MED ORDER — LACTATED RINGERS IV SOLN
INTRAVENOUS | Status: DC
  Administered 2018-01-20: 11:00:00 via INTRAVENOUS

## 2018-01-20 MED ORDER — PROPOFOL 10 MG/ML IV BOLUS
INTRAVENOUS | Status: DC | PRN
  Administered 2018-01-20: 140 mg via INTRAVENOUS

## 2018-01-20 MED ORDER — FLEET ENEMA 7-19 GM/118ML RE ENEM: 1.0000 | ENEMA | Freq: Once | RECTAL | Status: DC | PRN

## 2018-01-20 MED ORDER — FENTANYL CITRATE (PF) 100 MCG/2ML IJ SOLN
INTRAMUSCULAR | Status: DC | PRN
  Administered 2018-01-20 (×7): 50 ug via INTRAVENOUS

## 2018-01-20 MED ORDER — PROPOFOL 500 MG/50ML IV EMUL
INTRAVENOUS | Status: DC | PRN
  Administered 2018-01-20: 20 ug/kg/min via INTRAVENOUS

## 2018-01-20 MED ORDER — CHLORHEXIDINE GLUCONATE 4 % EX LIQD
60.0000 mL | Freq: Once | CUTANEOUS | Status: AC
  Administered 2018-01-20: 4 via TOPICAL

## 2018-01-20 MED ORDER — DIPHENHYDRAMINE HCL 12.5 MG/5ML PO ELIX: 12.5000 mg | ORAL_SOLUTION | ORAL | Status: DC | PRN

## 2018-01-20 MED ORDER — FENTANYL CITRATE (PF) 100 MCG/2ML IJ SOLN
25.0000 ug | INTRAMUSCULAR | Status: DC | PRN
  Administered 2018-01-20 (×3): 50 ug via INTRAVENOUS

## 2018-01-20 MED ORDER — ROPIVACAINE HCL 5 MG/ML IJ SOLN
INTRAMUSCULAR | Status: DC | PRN
  Administered 2018-01-20: 30 mL via PERINEURAL

## 2018-01-20 MED ORDER — PROMETHAZINE HCL 25 MG PO TABS: 25.0000 mg | ORAL_TABLET | Freq: Three times a day (TID) | ORAL | Status: DC | PRN

## 2018-01-20 MED ORDER — DEXAMETHASONE SODIUM PHOSPHATE 10 MG/ML IJ SOLN
10.0000 mg | Freq: Once | INTRAMUSCULAR | Status: AC
  Administered 2018-01-21: 10 mg via INTRAVENOUS
  Filled 2018-01-20: qty 1

## 2018-01-20 MED ORDER — ACETAMINOPHEN 10 MG/ML IV SOLN: 1000.0000 mg | Freq: Four times a day (QID) | INTRAVENOUS | Status: DC

## 2018-01-20 MED ORDER — ACETAMINOPHEN 500 MG PO TABS
1000.0000 mg | ORAL_TABLET | Freq: Four times a day (QID) | ORAL | Status: DC
  Administered 2018-01-20 – 2018-01-21 (×3): 1000 mg via ORAL
  Filled 2018-01-20 (×3): qty 2

## 2018-01-20 MED ORDER — SODIUM CHLORIDE (PF) 0.9 % IJ SOLN
INTRAMUSCULAR | Status: DC | PRN
  Administered 2018-01-20: 60 mL

## 2018-01-20 MED ORDER — SODIUM CHLORIDE (PF) 0.9 % IJ SOLN
INTRAMUSCULAR | Status: AC
  Filled 2018-01-20: qty 50

## 2018-01-20 MED ORDER — DEXAMETHASONE SODIUM PHOSPHATE 10 MG/ML IJ SOLN
INTRAMUSCULAR | Status: AC
  Filled 2018-01-20: qty 1

## 2018-01-20 MED ORDER — PANTOPRAZOLE SODIUM 40 MG PO TBEC: 40.0000 mg | DELAYED_RELEASE_TABLET | Freq: Every day | ORAL | Status: DC | PRN

## 2018-01-20 MED ORDER — FENTANYL CITRATE (PF) 250 MCG/5ML IJ SOLN
INTRAMUSCULAR | Status: AC
  Filled 2018-01-20: qty 5

## 2018-01-20 MED ORDER — ONDANSETRON HCL 4 MG/2ML IJ SOLN
INTRAMUSCULAR | Status: DC | PRN
  Administered 2018-01-20: 4 mg via INTRAVENOUS

## 2018-01-20 MED ORDER — DEXAMETHASONE SODIUM PHOSPHATE 10 MG/ML IJ SOLN: 8.0000 mg | Freq: Once | INTRAMUSCULAR | Status: DC

## 2018-01-20 MED ORDER — ONDANSETRON HCL 4 MG/2ML IJ SOLN
INTRAMUSCULAR | Status: AC
  Filled 2018-01-20: qty 2

## 2018-01-20 MED ORDER — CEFAZOLIN SODIUM-DEXTROSE 2-4 GM/100ML-% IV SOLN
2.0000 g | INTRAVENOUS | Status: AC
  Administered 2018-01-20: 2 g via INTRAVENOUS
  Filled 2018-01-20: qty 100

## 2018-01-20 MED ORDER — ONDANSETRON HCL 4 MG/2ML IJ SOLN: 4.0000 mg | Freq: Once | INTRAMUSCULAR | Status: DC | PRN

## 2018-01-20 MED ORDER — SPIRONOLACTONE 25 MG PO TABS
25.0000 mg | ORAL_TABLET | Freq: Two times a day (BID) | ORAL | Status: DC
  Filled 2018-01-20: qty 1

## 2018-01-20 MED ORDER — ALPRAZOLAM 0.5 MG PO TABS: 0.5000 mg | ORAL_TABLET | Freq: Every evening | ORAL | Status: DC | PRN

## 2018-01-20 MED ORDER — OXYCODONE HCL 5 MG PO TABS
5.0000 mg | ORAL_TABLET | ORAL | Status: DC | PRN
  Administered 2018-01-21 (×2): 10 mg via ORAL
  Filled 2018-01-20 (×2): qty 2

## 2018-01-20 MED ORDER — MIDAZOLAM HCL 2 MG/2ML IJ SOLN
1.0000 mg | Freq: Once | INTRAMUSCULAR | Status: AC
  Administered 2018-01-20: 2 mg via INTRAVENOUS
  Filled 2018-01-20: qty 2

## 2018-01-20 MED ORDER — FENTANYL CITRATE (PF) 100 MCG/2ML IJ SOLN
50.0000 ug | Freq: Once | INTRAMUSCULAR | Status: AC
  Administered 2018-01-20: 100 ug via INTRAVENOUS
  Filled 2018-01-20: qty 2

## 2018-01-20 MED ORDER — VALACYCLOVIR HCL 500 MG PO TABS
500.0000 mg | ORAL_TABLET | Freq: Every day | ORAL | Status: DC | PRN
  Filled 2018-01-20: qty 1

## 2018-01-20 MED ORDER — SODIUM CHLORIDE (PF) 0.9 % IJ SOLN
INTRAMUSCULAR | Status: AC
  Filled 2018-01-20: qty 10

## 2018-01-20 MED ORDER — LINACLOTIDE 145 MCG PO CAPS
290.0000 ug | ORAL_CAPSULE | Freq: Every day | ORAL | Status: DC | PRN
  Filled 2018-01-20: qty 2

## 2018-01-20 MED ORDER — PROPOFOL 10 MG/ML IV BOLUS
INTRAVENOUS | Status: AC
  Filled 2018-01-20: qty 40

## 2018-01-20 MED ORDER — METOCLOPRAMIDE HCL 5 MG/ML IJ SOLN: 5.0000 mg | Freq: Three times a day (TID) | INTRAMUSCULAR | Status: DC | PRN

## 2018-01-20 MED ORDER — MENTHOL 3 MG MT LOZG: 1.0000 | LOZENGE | OROMUCOSAL | Status: DC | PRN

## 2018-01-20 MED ORDER — BISACODYL 10 MG RE SUPP: 10.0000 mg | Freq: Every day | RECTAL | Status: DC | PRN

## 2018-01-20 MED ORDER — STERILE WATER FOR IRRIGATION IR SOLN
Status: DC | PRN
  Administered 2018-01-20: 2000 mL

## 2018-01-20 MED ORDER — DOCUSATE SODIUM 100 MG PO CAPS
100.0000 mg | ORAL_CAPSULE | Freq: Two times a day (BID) | ORAL | Status: DC
  Administered 2018-01-20 – 2018-01-21 (×2): 100 mg via ORAL
  Filled 2018-01-20 (×2): qty 1

## 2018-01-20 MED ORDER — OXYCODONE HCL 5 MG PO TABS
10.0000 mg | ORAL_TABLET | ORAL | Status: DC | PRN
  Administered 2018-01-20 – 2018-01-21 (×2): 10 mg via ORAL
  Filled 2018-01-20: qty 3
  Filled 2018-01-20: qty 2

## 2018-01-20 MED ORDER — RIVAROXABAN 10 MG PO TABS
10.0000 mg | ORAL_TABLET | Freq: Every day | ORAL | Status: DC
  Administered 2018-01-21: 10 mg via ORAL
  Filled 2018-01-20: qty 1

## 2018-01-20 MED ORDER — CYCLOBENZAPRINE HCL 10 MG PO TABS: 10.0000 mg | ORAL_TABLET | Freq: Three times a day (TID) | ORAL | Status: DC | PRN

## 2018-01-20 MED ORDER — SCOPOLAMINE 1 MG/3DAYS TD PT72
MEDICATED_PATCH | TRANSDERMAL | Status: AC
  Filled 2018-01-20: qty 1

## 2018-01-20 MED ORDER — BUPIVACAINE LIPOSOME 1.3 % IJ SUSP
INTRAMUSCULAR | Status: DC | PRN
  Administered 2018-01-20: 20 mL

## 2018-01-20 MED ORDER — GABAPENTIN 300 MG PO CAPS
300.0000 mg | ORAL_CAPSULE | Freq: Three times a day (TID) | ORAL | Status: DC
  Administered 2018-01-20 – 2018-01-21 (×3): 300 mg via ORAL
  Filled 2018-01-20 (×3): qty 1

## 2018-01-20 MED ORDER — SODIUM CHLORIDE 0.9 % IR SOLN
Status: DC | PRN
  Administered 2018-01-20: 1000 mL

## 2018-01-20 MED ORDER — GABAPENTIN 300 MG PO CAPS
300.0000 mg | ORAL_CAPSULE | Freq: Once | ORAL | Status: AC
  Administered 2018-01-20: 300 mg via ORAL
  Filled 2018-01-20: qty 1

## 2018-01-20 MED ORDER — METHOCARBAMOL 500 MG PO TABS
500.0000 mg | ORAL_TABLET | Freq: Four times a day (QID) | ORAL | Status: DC | PRN
  Administered 2018-01-21 (×2): 500 mg via ORAL
  Filled 2018-01-20 (×2): qty 1

## 2018-01-20 MED ORDER — METHOCARBAMOL 500 MG IVPB - SIMPLE MED
500.0000 mg | Freq: Four times a day (QID) | INTRAVENOUS | Status: DC | PRN
  Administered 2018-01-20: 500 mg via INTRAVENOUS
  Filled 2018-01-20: qty 50

## 2018-01-20 MED ORDER — METOCLOPRAMIDE HCL 5 MG PO TABS: 5.0000 mg | ORAL_TABLET | Freq: Three times a day (TID) | ORAL | Status: DC | PRN

## 2018-01-20 MED ORDER — SCOPOLAMINE 1 MG/3DAYS TD PT72
MEDICATED_PATCH | TRANSDERMAL | Status: DC | PRN
  Administered 2018-01-20: 1 via TRANSDERMAL

## 2018-01-20 MED ORDER — PHENOL 1.4 % MT LIQD
1.0000 | OROMUCOSAL | Status: DC | PRN
  Filled 2018-01-20: qty 177

## 2018-01-20 MED ORDER — CEFAZOLIN SODIUM-DEXTROSE 2-4 GM/100ML-% IV SOLN
2.0000 g | Freq: Four times a day (QID) | INTRAVENOUS | Status: AC
  Administered 2018-01-20 – 2018-01-21 (×2): 2 g via INTRAVENOUS
  Filled 2018-01-20 (×2): qty 100

## 2018-01-20 MED ORDER — DEXAMETHASONE SODIUM PHOSPHATE 4 MG/ML IJ SOLN
INTRAMUSCULAR | Status: DC | PRN
  Administered 2018-01-20: 8 mg via INTRAVENOUS

## 2018-01-20 MED ORDER — MORPHINE SULFATE (PF) 2 MG/ML IV SOLN
1.0000 mg | INTRAVENOUS | Status: DC | PRN
  Administered 2018-01-20 (×2): 1 mg via INTRAVENOUS
  Filled 2018-01-20 (×2): qty 1

## 2018-01-20 MED FILL — HUMIRA PEN 40 MG/0.4ML PNKT: 40 | 28 days supply | Qty: 4 | Fill #1

## 2018-01-20 SURGICAL SUPPLY — 61 items
ATTUNE PS FEM LT SZ 7 CEM KNEE (Femur) ×1 IMPLANT
ATTUNE PSRP INSR SZ7 7 KNEE (Insert) ×1 IMPLANT
BAG SPEC THK2 15X12 ZIP CLS (MISCELLANEOUS) ×1
BAG ZIPLOCK 12X15 (MISCELLANEOUS) ×2 IMPLANT
BANDAGE ACE 6X5 VEL STRL LF (GAUZE/BANDAGES/DRESSINGS) ×2 IMPLANT
BANDAGE ELASTIC 6 VELCRO ST LF (GAUZE/BANDAGES/DRESSINGS) ×1 IMPLANT
BASE TIBIA ATTUNE KNEE SYS SZ6 (Knees) IMPLANT
BLADE SAG 18X100X1.27 (BLADE) ×2 IMPLANT
BLADE SAW SGTL 11.0X1.19X90.0M (BLADE) ×2 IMPLANT
BOWL SMART MIX CTS (DISPOSABLE) ×2 IMPLANT
BSPLAT TIB 6 CMNT ROT PLAT STR (Knees) ×1 IMPLANT
CEMENT HV SMART SET (Cement) ×4 IMPLANT
COVER SURGICAL LIGHT HANDLE (MISCELLANEOUS) ×2 IMPLANT
COVER WAND RF STERILE (DRAPES) IMPLANT
CUFF TOURN SGL QUICK 34 (TOURNIQUET CUFF) ×2
CUFF TRNQT CYL 34X4X40X1 (TOURNIQUET CUFF) ×1 IMPLANT
DECANTER SPIKE VIAL GLASS SM (MISCELLANEOUS) ×2 IMPLANT
DRAPE U-SHAPE 47X51 STRL (DRAPES) ×2 IMPLANT
DRSG ADAPTIC 3X8 NADH LF (GAUZE/BANDAGES/DRESSINGS) ×2 IMPLANT
DRSG PAD ABDOMINAL 8X10 ST (GAUZE/BANDAGES/DRESSINGS) ×2 IMPLANT
DURAPREP 26ML APPLICATOR (WOUND CARE) ×2 IMPLANT
ELECT REM PT RETURN 15FT ADLT (MISCELLANEOUS) ×2 IMPLANT
EVACUATOR 1/8 PVC DRAIN (DRAIN) ×2 IMPLANT
GAUZE SPONGE 4X4 12PLY STRL (GAUZE/BANDAGES/DRESSINGS) ×2 IMPLANT
GLOVE BIO SURGEON STRL SZ7 (GLOVE) ×2 IMPLANT
GLOVE BIO SURGEON STRL SZ8 (GLOVE) ×2 IMPLANT
GLOVE BIOGEL PI IND STRL 6.5 (GLOVE) ×1 IMPLANT
GLOVE BIOGEL PI IND STRL 7.0 (GLOVE) ×1 IMPLANT
GLOVE BIOGEL PI IND STRL 8 (GLOVE) ×1 IMPLANT
GLOVE BIOGEL PI INDICATOR 6.5 (GLOVE) ×1
GLOVE BIOGEL PI INDICATOR 7.0 (GLOVE) ×1
GLOVE BIOGEL PI INDICATOR 8 (GLOVE) ×1
GLOVE SURG SS PI 6.5 STRL IVOR (GLOVE) ×2 IMPLANT
GOWN STRL REUS W/TWL LRG LVL3 (GOWN DISPOSABLE) ×4 IMPLANT
GOWN STRL REUS W/TWL XL LVL3 (GOWN DISPOSABLE) ×2 IMPLANT
HANDPIECE INTERPULSE COAX TIP (DISPOSABLE) ×2
HOLDER FOLEY CATH W/STRAP (MISCELLANEOUS) IMPLANT
IMMOBILIZER KNEE 20 (SOFTGOODS) ×2
IMMOBILIZER KNEE 20 THIGH 36 (SOFTGOODS) ×1 IMPLANT
IMMOBILIZER KNEE 22 UNIV (SOFTGOODS) ×1 IMPLANT
MANIFOLD NEPTUNE II (INSTRUMENTS) ×2 IMPLANT
NS IRRIG 1000ML POUR BTL (IV SOLUTION) ×2 IMPLANT
PACK TOTAL KNEE CUSTOM (KITS) ×2 IMPLANT
PAD ABD 7.5X8 STRL (GAUZE/BANDAGES/DRESSINGS) ×1 IMPLANT
PADDING CAST COTTON 6X4 STRL (CAST SUPPLIES) ×5 IMPLANT
PATELLA MEDIAL ATTUN 35MM KNEE (Knees) ×1 IMPLANT
PIN STEINMAN FIXATION KNEE (PIN) ×1 IMPLANT
PROTECTOR NERVE ULNAR (MISCELLANEOUS) ×2 IMPLANT
SET HNDPC FAN SPRY TIP SCT (DISPOSABLE) ×1 IMPLANT
STRIP CLOSURE SKIN 1/2X4 (GAUZE/BANDAGES/DRESSINGS) ×4 IMPLANT
SUT MNCRL AB 4-0 PS2 18 (SUTURE) ×2 IMPLANT
SUT STRATAFIX 0 PDS 27 VIOLET (SUTURE) ×2
SUT VIC AB 2-0 CT1 27 (SUTURE) ×6
SUT VIC AB 2-0 CT1 TAPERPNT 27 (SUTURE) ×3 IMPLANT
SUTURE STRATFX 0 PDS 27 VIOLET (SUTURE) ×1 IMPLANT
TAPE STRIPS DRAPE STRL (GAUZE/BANDAGES/DRESSINGS) ×1 IMPLANT
TIBIA ATTUNE KNEE SYS BASE SZ6 (Knees) ×2 IMPLANT
TRAY FOLEY MTR SLVR 16FR STAT (SET/KITS/TRAYS/PACK) ×2 IMPLANT
WATER STERILE IRR 1000ML POUR (IV SOLUTION) ×4 IMPLANT
WRAP KNEE MAXI GEL POST OP (GAUZE/BANDAGES/DRESSINGS) ×2 IMPLANT
YANKAUER SUCT BULB TIP 10FT TU (MISCELLANEOUS) ×2 IMPLANT

## 2018-01-20 NOTE — Progress Notes (Signed)
Assisted Dr. Christella Hartigan with left, ultrasound guided, adductor canal block. Side rails up, monitors on throughout procedure. See vital signs in flow sheet. Tolerated Procedure well.

## 2018-01-20 NOTE — Anesthesia Preprocedure Evaluation (Addendum)
Anesthesia Evaluation    Reviewed: Allergy & Precautions, Patient's Chart, lab work & pertinent test results  History of Anesthesia Complications (+) PONVNegative for: history of anesthetic complications  Airway Mallampati: II  TM Distance: >3 FB Neck ROM: Full    Dental no notable dental hx.    Pulmonary neg pulmonary ROS,    Pulmonary exam normal        Cardiovascular negative cardio ROS Normal cardiovascular exam     Neuro/Psych PSYCHIATRIC DISORDERS Anxiety negative neurological ROS     GI/Hepatic Neg liver ROS, PUD, GERD  ,  Endo/Other  Hypothyroidism   Renal/GU negative Renal ROS  negative genitourinary   Musculoskeletal  (+) Arthritis , Osteoarthritis,    Abdominal   Peds  Hematology negative hematology ROS (+)   Anesthesia Other Findings   Reproductive/Obstetrics                             Anesthesia Physical Anesthesia Plan  ASA: II  Anesthesia Plan: General   Post-op Pain Management:    Induction: Intravenous  PONV Risk Score and Plan: 4 or greater and Ondansetron, Dexamethasone, Midazolam, Scopolamine patch - Pre-op and Treatment may vary due to age or medical condition  Airway Management Planned: LMA  Additional Equipment: None  Intra-op Plan:   Post-operative Plan: Extubation in OR  Informed Consent: I have reviewed the patients History and Physical, chart, labs and discussed the procedure including the risks, benefits and alternatives for the proposed anesthesia with the patient or authorized representative who has indicated his/her understanding and acceptance.     Plan Discussed with:   Anesthesia Plan Comments:         Anesthesia Quick Evaluation

## 2018-01-20 NOTE — Anesthesia Procedure Notes (Signed)
Anesthesia Regional Block: Adductor canal block   Pre-Anesthetic Checklist: ,, timeout performed, Correct Patient, Correct Site, Correct Laterality, Correct Procedure, Correct Position, site marked, Risks and benefits discussed,  Surgical consent,  Pre-op evaluation,  At surgeon's request and post-op pain management  Laterality: Left  Prep: chloraprep       Needles:  Injection technique: Single-shot  Needle Type: Echogenic Stimulator Needle     Needle Length: 9cm  Needle Gauge: 21     Additional Needles:   Procedures:,,,, ultrasound used (permanent image in chart),,,,  Narrative:  Start time: 01/20/2018 11:55 AM End time: 01/20/2018 12:01 PM Injection made incrementally with aspirations every 5 mL.  Performed by: Personally  Anesthesiologist: Lidia Collum, MD  Additional Notes: Monitors applied. Injection made in 5cc increments. No resistance to injection. Good needle visualization. Patient tolerated procedure well.

## 2018-01-20 NOTE — Op Note (Signed)
OPERATIVE REPORT-TOTAL KNEE ARTHROPLASTY   Pre-operative diagnosis- Osteoarthritis  Left knee(s)  Post-operative diagnosis- Osteoarthritis Left knee(s)  Procedure-  Left  Total Knee Arthroplasty (Depuy Attune)  Surgeon- Dione Plover. [name], MD  Assistant- Ardeen Jourdain, PA-C   Anesthesia-  GA combined with regional for post-op pain  EBL-25 mL   Drains Hemovac  Tourniquet time-  Total Tourniquet Time Documented: Thigh (Left) - 42 minutes Total: Thigh (Left) - 42 minutes     Complications- None  Condition-PACU - hemodynamically stable.   Brief Clinical Note  Victoria [name] is a 57 y.o. year old female with end stage OA of her left knee with progressively worsening pain and dysfunction. She has constant pain, with activity and at rest and significant functional deficits with difficulties even with ADLs. She has had extensive non-op management including analgesics, injections of cortisone and viscosupplements, and home exercise program, but remains in significant pain with significant dysfunction. Radiographs show bone on bone arthritis lateral. She presents now for left Total Knee Arthroplasty.    Procedure in detail---   The patient is brought into the operating room and positioned supine on the operating table. After successful administration of  GA combined with regional for post-op pain,   a tourniquet is placed high on the  Left thigh(s) and the lower extremity is prepped and draped in the usual sterile fashion. Time out is performed by the operating team and then the  Left lower extremity is wrapped in Esmarch, knee flexed and the tourniquet inflated to 300 mmHg.       A midline incision is made with a ten blade through the subcutaneous tissue to the level of the extensor mechanism. A fresh blade is used to make a medial parapatellar arthrotomy. Soft tissue over the proximal medial tibia is subperiosteally elevated to the joint line with a knife and into the  semimembranosus bursa with a Cobb elevator. Soft tissue over the proximal lateral tibia is elevated with attention being paid to avoiding the patellar tendon on the tibial tubercle. The patella is everted, knee flexed 90 degrees and the ACL and PCL are removed. Findings are exposed bone all 3 compartments with massive synovitis        The drill is used to create a starting hole in the distal femur and the canal is thoroughly irrigated with sterile saline to remove the fatty contents. The 5 degree Left  valgus alignment guide is placed into the femoral canal and the distal femoral cutting block is pinned to remove 9 mm off the distal femur. Resection is made with an oscillating saw.      The tibia is subluxed forward and the menisci are removed. The extramedullary alignment guide is placed referencing proximally at the medial aspect of the tibial tubercle and distally along the second metatarsal axis and tibial crest. The block is pinned to remove 81mm off the more deficient lateral  side. Resection is made with an oscillating saw. Size 6is the most appropriate size for the tibia and the proximal tibia is prepared with the modular drill and keel punch for that size.      The femoral sizing guide is placed and size 7 is most appropriate. Rotation is marked off the epicondylar axis and confirmed by creating a rectangular flexion gap at 90 degrees. The size 7 cutting block is pinned in this rotation and the anterior, posterior and chamfer cuts are made with the oscillating saw. The intercondylar block is then placed and that cut is  made.      Trial size 6 tibial component, trial size 7 posterior stabilized femur and a 7  mm posterior stabilized rotating platform insert trial is placed. Full extension is achieved with excellent varus/valgus and anterior/posterior balance throughout full range of motion. The patella is everted and thickness measured to be 24  mm. Free hand resection is taken to 14 mm, a 35 template is  placed, lug holes are drilled, trial patella is placed, and it tracks normally. Osteophytes are removed off the posterior femur with the trial in place. All trials are removed and the cut bone surfaces prepared with pulsatile lavage. Cement is mixed and once ready for implantation, the size 6 tibial implant, size  7 posterior stabilized femoral component, and the size 35 patella are cemented in place and the patella is held with the clamp. The trial insert is placed and the knee held in full extension. The Exparel (20 ml mixed with 60 ml saline) is injected into the extensor mechanism, posterior capsule, medial and lateral gutters and subcutaneous tissues.  All extruded cement is removed and once the cement is hard the permanent 7 mm posterior stabilized rotating platform insert is placed into the tibial tray.      The wound is copiously irrigated with saline solution and the extensor mechanism closed over a hemovac drain with #1 V-loc suture. The tourniquet is released for a total tourniquet time of 42  minutes. Flexion against gravity is 140 degrees and the patella tracks normally. Subcutaneous tissue is closed with 2.0 vicryl and subcuticular with running 4.0 Monocryl. The incision is cleaned and dried and steri-strips and a bulky sterile dressing are applied. The limb is placed into a knee immobilizer and the patient is awakened and transported to recovery in stable condition.      Please note that a surgical assistant was a medical necessity for this procedure in order to perform it in a safe and expeditious manner. Surgical assistant was necessary to retract the ligaments and vital neurovascular structures to prevent injury to them and also necessary for proper positioning of the limb to allow for anatomic placement of the prosthesis.   Dione Plover [name], MD    01/20/2018, 2:01 PM

## 2018-01-20 NOTE — Progress Notes (Signed)
PT Cancellation Note  Patient Details Name: Victoria [name] MRN: [id number] DOB: 1960-12-28   Cancelled Treatment:     chart reviewed attempted to see pt. Pt politely refused stating " my pain is too bad, I cannot try to move tonight, my block did not work well". She has her pain meds on board and I encouraged when it gets under control to make sure  She agrees to dangle with nursing tonight. Also gave her ankle pumps and glut sets to continue tonight.    Clide Dales 01/20/2018, 6:26 PM  Clide Dales, PT Acute Rehabilitation Services Pager: [phone] Office: [phone] 01/20/2018

## 2018-01-20 NOTE — Anesthesia Postprocedure Evaluation (Signed)
Anesthesia Post Note  Patient: Dawson Hollman  Procedure(s) Performed: LEFT TOTAL KNEE ARTHROPLASTY (Left Knee)     Patient location during evaluation: PACU Anesthesia Type: General Level of consciousness: awake and alert Pain management: pain level controlled Vital Signs Assessment: post-procedure vital signs reviewed and stable Respiratory status: spontaneous breathing, nonlabored ventilation, respiratory function stable and patient connected to nasal cannula oxygen Cardiovascular status: blood pressure returned to baseline and stable Postop Assessment: no apparent nausea or vomiting Anesthetic complications: no    Last Vitals:  Vitals:   01/20/18 1756 01/20/18 1800  BP: (!) 139/97   Pulse: 80   Resp:    Temp:  36.9 C  SpO2: 100%     Last Pain:  Vitals:   01/20/18 1800  TempSrc: Oral  PainSc:     LLE Motor Response: Purposeful movement (01/20/18 1847) LLE Sensation: Full sensation (01/20/18 1847) RLE Motor Response: Purposeful movement (01/20/18 1847) RLE Sensation: Full sensation (01/20/18 1847) L Sensory Level: S1-Sole of foot, small toes (01/20/18 1847) R Sensory Level: S1-Sole of foot, small toes (01/20/18 1847)  Lidia Collum

## 2018-01-20 NOTE — Anesthesia Procedure Notes (Signed)
Procedure Name: LMA Insertion Date/Time: 01/20/2018 12:43 PM Performed by: Glory Buff, CRNA Pre-anesthesia Checklist: Patient identified, Emergency Drugs available, Suction available and Patient being monitored Patient Re-evaluated:Patient Re-evaluated prior to induction Oxygen Delivery Method: Circle system utilized Preoxygenation: Pre-oxygenation with 100% oxygen Induction Type: IV induction LMA: LMA inserted LMA Size: 4.0 Number of attempts: 1 Placement Confirmation: positive ETCO2 Tube secured with: Tape Dental Injury: Teeth and Oropharynx as per pre-operative assessment

## 2018-01-20 NOTE — Interval H&P Note (Signed)
History and Physical Interval Note:  01/20/2018 10:31 AM  Victoria [name]  has presented today for surgery, with the diagnosis of left knee osteoathritis  The various methods of treatment have been discussed with the patient and family. After consideration of risks, benefits and other options for treatment, the patient has consented to  Procedure(s) with comments: LEFT TOTAL KNEE ARTHROPLASTY (Left) - 18min as a surgical intervention .  The patient's history has been reviewed, patient examined, no change in status, stable for surgery.  I have reviewed the patient's chart and labs.  Questions were answered to the patient's satisfaction.     Pilar Plate [name]

## 2018-01-20 NOTE — Discharge Instructions (Addendum)
° °Dr. Frank [name] °Total Joint Specialist °[facility] °[address] °[place], [place] [address] °[phone] ° °TOTAL KNEE REPLACEMENT POSTOPERATIVE DIRECTIONS ° °Knee Rehabilitation, Guidelines Following Surgery  °Results after knee surgery are often greatly improved when you follow the exercise, range of motion and muscle strengthening exercises prescribed by your doctor. Safety measures are also important to protect the knee from further injury. Any time any of these exercises cause you to have increased pain or swelling in your knee joint, decrease the amount until you are comfortable again and slowly increase them. If you have problems or questions, call your caregiver or physical therapist for advice.  ° °HOME CARE INSTRUCTIONS  °• Remove items at home which could result in a fall. This includes throw rugs or furniture in walking pathways.  °· ICE to the affected knee every three hours for 30 minutes at a time and then as needed for pain and swelling.  Continue to use ice on the knee for pain and swelling from surgery. You may notice swelling that will progress down to the foot and ankle.  This is normal after surgery.  Elevate the leg when you are not up walking on it.   °· Continue to use the breathing machine which will help keep your temperature down.  It is common for your temperature to cycle up and down following surgery, especially at night when you are not up moving around and exerting yourself.  The breathing machine keeps your lungs expanded and your temperature down. °· Do not place pillow under knee, focus on keeping the knee straight while resting ° °DIET °You may resume your previous home diet once your are discharged from the hospital. ° °DRESSING / WOUND CARE / SHOWERING °You may shower 3 days after surgery, but keep the wounds dry during showering.  You may use an occlusive plastic wrap (Press'n Seal for example), NO SOAKING/SUBMERGING IN THE BATHTUB.  If the bandage  gets wet, change with a clean dry gauze.  If the incision gets wet, pat the wound dry with a clean towel. °You may start showering once you are discharged home but do not submerge the incision under water. Just pat the incision dry and apply a dry gauze dressing on daily. °Change the surgical dressing daily and reapply a dry dressing each time. ° °ACTIVITY °Walk with your walker as instructed. °Use walker as long as suggested by your caregivers. °Avoid periods of inactivity such as sitting longer than an hour when not asleep. This helps prevent blood clots.  °You may resume a sexual relationship in one month or when given the OK by your doctor.  °You may return to work once you are cleared by your doctor.  °Do not drive a car for 6 weeks or until released by you surgeon.  °Do not drive while taking narcotics. ° °WEIGHT BEARING °Weight bearing as tolerated with assist device (walker, cane, etc) as directed, use it as long as suggested by your surgeon or therapist, typically at least 4-6 weeks. ° °POSTOPERATIVE CONSTIPATION PROTOCOL °Constipation - defined medically as fewer than three stools per week and severe constipation as less than one stool per week. ° °One of the most common issues patients have following surgery is constipation.  Even if you have a regular bowel pattern at home, your normal regimen is likely to be disrupted due to multiple reasons following surgery.  Combination of anesthesia, postoperative narcotics, change in appetite and fluid intake all can affect your bowels.    In order to avoid complications following surgery, here are some recommendations in order to help you during your recovery period. ° °Colace (docusate) - Pick up an over-the-counter form of Colace or another stool softener and take twice a day as long as you are requiring postoperative pain medications.  Take with a full glass of water daily.  If you experience loose stools or diarrhea, hold the colace until you stool forms back  up.  If your symptoms do not get better within 1 week or if they get worse, check with your doctor. ° °Dulcolax (bisacodyl) - Pick up over-the-counter and take as directed by the product packaging as needed to assist with the movement of your bowels.  Take with a full glass of water.  Use this product as needed if not relieved by Colace only.  ° °MiraLax (polyethylene glycol) - Pick up over-the-counter to have on hand.  MiraLax is a solution that will increase the amount of water in your bowels to assist with bowel movements.  Take as directed and can mix with a glass of water, juice, soda, coffee, or tea.  Take if you go more than two days without a movement. °Do not use MiraLax more than once per day. Call your doctor if you are still constipated or irregular after using this medication for 7 days in a row. ° °If you continue to have problems with postoperative constipation, please contact the office for further assistance and recommendations.  If you experience "the worst abdominal pain ever" or develop nausea or vomiting, please contact the office immediatly for further recommendations for treatment. ° °ITCHING ° If you experience itching with your medications, try taking only a single pain pill, or even half a pain pill at a time.  You can also use Benadryl over the counter for itching or also to help with sleep.  ° °TED HOSE STOCKINGS °Wear the elastic stockings on both legs for three weeks following surgery during the day but you may remove then at night for sleeping. ° °MEDICATIONS °See your medication summary on the “After Visit Summary” that the nursing staff will review with you prior to discharge.  You may have some home medications which will be placed on hold until you complete the course of blood thinner medication.  It is important for you to complete the blood thinner medication as prescribed by your surgeon.  Continue your approved medications as instructed at time of discharge. ° °PRECAUTIONS °If  you experience chest pain or shortness of breath - call 911 immediately for transfer to the hospital emergency department.  °If you develop a fever greater that 101 F, purulent drainage from wound, increased redness or drainage from wound, foul odor from the wound/dressing, or calf pain - CONTACT YOUR SURGEON.   °                                                °FOLLOW-UP APPOINTMENTS °Make sure you keep all of your appointments after your operation with your surgeon and caregivers. You should call the office at the above phone number and make an appointment for approximately two weeks after the date of your surgery or on the date instructed by your surgeon outlined in the "After Visit Summary". ° ° °RANGE OF MOTION AND STRENGTHENING EXERCISES  °Rehabilitation of the knee is important following a knee injury or   an operation. After just a few days of immobilization, the muscles of the thigh which control the knee become weakened and shrink (atrophy). Knee exercises are designed to build up the tone and strength of the thigh muscles and to improve knee motion. Often times heat used for twenty to thirty minutes before working out will loosen up your tissues and help with improving the range of motion but do not use heat for the first two weeks following surgery. These exercises can be done on a training (exercise) mat, on the floor, on a table or on a bed. Use what ever works the best and is most comfortable for you Knee exercises include:  °• Leg Lifts - While your knee is still immobilized in a splint or cast, you can do straight leg raises. Lift the leg to 60 degrees, hold for 3 sec, and slowly lower the leg. Repeat 10-20 times 2-3 times daily. Perform this exercise against resistance later as your knee gets better.  °• Quad and Hamstring Sets - Tighten up the muscle on the front of the thigh (Quad) and hold for 5-10 sec. Repeat this 10-20 times hourly. Hamstring sets are done by pushing the foot backward against an  object and holding for 5-10 sec. Repeat as with quad sets.  °· Leg Slides: Lying on your back, slowly slide your foot toward your buttocks, bending your knee up off the floor (only go as far as is comfortable). Then slowly slide your foot back down until your leg is flat on the floor again. °· Angel Wings: Lying on your back spread your legs to the side as far apart as you can without causing discomfort.  °A rehabilitation program following serious knee injuries can speed recovery and prevent re-injury in the future due to weakened muscles. Contact your doctor or a physical therapist for more information on knee rehabilitation.  ° °IF YOU ARE TRANSFERRED TO A SKILLED REHAB FACILITY °If the patient is transferred to a skilled rehab facility following release from the hospital, a list of the current medications will be sent to the facility for the patient to continue.  When discharged from the skilled rehab facility, please have the facility set up the patient's Home Health Physical Therapy prior to being released. Also, the skilled facility will be responsible for providing the patient with their medications at time of release from the facility to include their pain medication, the muscle relaxants, and their blood thinner medication. If the patient is still at the rehab facility at time of the two week follow up appointment, the skilled rehab facility will also need to assist the patient in arranging follow up appointment in our office and any transportation needs. ° °MAKE SURE YOU:  °• Understand these instructions.  °• Get help right away if you are not doing well or get worse.  ° ° °Pick up stool softner and laxative for home use following surgery while on pain medications. °Do not submerge incision under water. °Please use good hand washing techniques while changing dressing each day. °May shower starting three days after surgery. °Please use a clean towel to pat the incision dry following showers. °Continue to  use ice for pain and swelling after surgery. °Do not use any lotions or creams on the incision until instructed by your surgeon. ° ° °_____________________________________________ ° °Information on my medicine - XARELTO® (Rivaroxaban) ° °This medication education was reviewed with me or my healthcare representative as part of my discharge preparation.  ° °Why was   Xarelto® prescribed for you? °Xarelto® was prescribed for you to reduce the risk of blood clots forming after orthopedic surgery. The medical term for these abnormal blood clots is venous thromboembolism (VTE). ° °What do you need to know about xarelto® ? °Take your Xarelto® ONCE DAILY at the same time every day. °You may take it either with or without food. ° °If you have difficulty swallowing the tablet whole, you may crush it and mix in applesauce just prior to taking your dose. ° °Take Xarelto® exactly as prescribed by your doctor and DO NOT stop taking Xarelto® without talking to the doctor who prescribed the medication.  Stopping without other VTE prevention medication to take the place of Xarelto® may increase your risk of developing a clot. ° °After discharge, you should have regular check-up appointments with your healthcare provider that is prescribing your Xarelto®.   ° °What do you do if you miss a dose? °If you miss a dose, take it as soon as you remember on the same day then continue your regularly scheduled once daily regimen the next day. Do not take two doses of Xarelto® on the same day.  ° °Important Safety Information °A possible side effect of Xarelto® is bleeding. You should call your healthcare provider right away if you experience any of the following: °? Bleeding from an injury or your nose that does not stop. °? Unusual colored urine (red or dark [place]) or unusual colored stools (red or black). °? Unusual bruising for unknown reasons. °? A serious fall or if you hit your head (even if there is no bleeding). ° °Some medicines may  interact with Xarelto® and might increase your risk of bleeding while on Xarelto®. To help avoid this, consult your healthcare provider or pharmacist prior to using any new prescription or non-prescription medications, including herbals, vitamins, non-steroidal anti-inflammatory drugs (NSAIDs) and supplements. ° °This website has more information on Xarelto®: www.xarelto.com. ° ° °

## 2018-01-20 NOTE — Transfer of Care (Signed)
Immediate Anesthesia Transfer of Care Note  Patient: Victoria [name]  Procedure(s) Performed: LEFT TOTAL KNEE ARTHROPLASTY (Left Knee)  Patient Location: PACU  Anesthesia Type:General  Level of Consciousness: awake, alert  and oriented  Airway & Oxygen Therapy: Patient Spontanous Breathing and Patient connected to face mask oxygen  Post-op Assessment: Report given to RN and Post -op Vital signs reviewed and stable  Post vital signs: Reviewed and stable  Last Vitals:  Vitals Value Taken Time  BP 149/105 01/20/2018  2:17 PM  Temp    Pulse    Resp 12 01/20/2018  2:21 PM  SpO2    Vitals shown include unvalidated device data.  Last Pain:  Vitals:   01/20/18 1417  TempSrc:   PainSc: (P) 10-Worst pain ever      Patients Stated Pain Goal: (P) 2 (16/60/60 0459)  Complications: No apparent anesthesia complications

## 2018-01-21 ENCOUNTER — Encounter (HOSPITAL_COMMUNITY): Payer: Self-pay | Admitting: Orthopedic Surgery

## 2018-01-21 LAB — BASIC METABOLIC PANEL
Anion gap: 10 (ref 5–15)
BUN: 10 mg/dL (ref 6–20)
CO2: 22 mmol/L (ref 22–32)
Calcium: 8.2 mg/dL — ABNORMAL LOW (ref 8.9–10.3)
Chloride: 108 mmol/L (ref 98–111)
Creatinine, Ser: 0.88 mg/dL (ref 0.44–1.00)
GFR calc Af Amer: >60 mL/min (ref >60)
GFR calc non Af Amer: >60 mL/min (ref >60)
Glucose, Bld: 111 mg/dL — ABNORMAL HIGH (ref 70–99)
Potassium: 4.6 mmol/L (ref 3.5–5.1)
Sodium: 140 mmol/L (ref 135–145)

## 2018-01-21 LAB — CBC
HCT: 32.7 % — ABNORMAL LOW (ref 36.0–46.0)
Hemoglobin: 10 g/dL — ABNORMAL LOW (ref 12.0–15.0)
MCH: 29.8 pg (ref 26.0–34.0)
MCHC: 30.6 g/dL (ref 30.0–36.0)
MCV: 97.3 fL (ref 80.0–100.0)
Platelets: 242 10*3/uL (ref 150–400)
RBC: 3.36 MIL/uL — ABNORMAL LOW (ref 3.87–5.11)
RDW: 15 % (ref 11.5–15.5)
WBC: 8.1 10*3/uL (ref 4.0–10.5)
nRBC: 0 % (ref 0.0–0.2)

## 2018-01-21 MED ORDER — OXYCODONE HCL 5 MG PO TABS: 5.0000 mg | ORAL_TABLET | Freq: Four times a day (QID) | ORAL | 0 refills | Status: DC | PRN

## 2018-01-21 MED ORDER — RIVAROXABAN 10 MG PO TABS: 10.0000 mg | ORAL_TABLET | Freq: Every day | ORAL | 0 refills | Status: DC

## 2018-01-21 MED ORDER — GABAPENTIN 300 MG PO CAPS: 300.0000 mg | ORAL_CAPSULE | Freq: Three times a day (TID) | ORAL | 0 refills | Status: DC

## 2018-01-21 MED ORDER — METHOCARBAMOL 500 MG PO TABS: 500.0000 mg | ORAL_TABLET | Freq: Four times a day (QID) | ORAL | 0 refills | Status: DC | PRN

## 2018-01-21 MED FILL — METHOCARBAMOL 500 MG TABLET: 500 | 10 days supply | Qty: 40 | Fill #0

## 2018-01-21 MED FILL — GABAPENTIN 300 MG CAPSULE: 300 | 42 days supply | Qty: 84 | Fill #0

## 2018-01-21 MED FILL — XARELTO 10 MG TABLET: 10 | 20 days supply | Qty: 20 | Fill #0

## 2018-01-21 MED FILL — oxyCODONE HCL 5 MG TABS: 5 | 7 days supply | Qty: 56 | Fill #0

## 2018-01-21 NOTE — Progress Notes (Signed)
   01/21/18 1200  PT Visit Information  Last PT Received On 01/21/18  Assistance Needed +1  History of Present Illness s/p L TKA; Hx: bil THAs  Precautions  Precautions Knee  Precaution Comments IND SLRs, reviewed use of KI as well as when to use   Restrictions  Weight Bearing Restrictions No  Pain Assessment  Pain Assessment 0-10  Pain Score 4  Pain Location L knee  Pain Descriptors / Indicators Grimacing;Guarding  Pain Intervention(s) Limited activity within patient's tolerance;Monitored during session;Premedicated before session  Cognition  Arousal/Alertness Awake/alert  Behavior During Therapy WFL for tasks assessed/performed  Overall Cognitive Status Within Functional Limits for tasks assessed  Bed Mobility  General bed mobility comments pt received in chair  Transfers  Overall transfer level Needs assistance  Equipment used Rolling walker (2 wheeled)  Transfers Sit to/from Stand  Sit to Stand Supervision  General transfer comment cues for hand placement, pt self cues end of session  Ambulation/Gait  Ambulation/Gait assistance Supervision  Gait Distance (Feet) 25 Feet  Assistive device Rolling walker (2 wheeled)  Gait Pattern/deviations Step-to pattern;Decreased weight shift to left  General Gait Details cues for sequence, RW position  Stairs Yes  Stairs assistance Min guard  Stair Management One rail Right;One rail Left;Sideways  Number of Stairs 4  General stair comments cues for sequence and which rail  to use  Total Joint Exercises  Ankle Circles/Pumps AROM;Both;10 reps  Quad Sets AROM;Both;5 reps  Heel Slides AAROM;Left;10 reps  Straight Leg Raises AROM;AAROM;Left;5 reps  Towel Squeeze AROM;Both;10 reps  PT - End of Session  Equipment Utilized During Treatment Gait belt  Activity Tolerance Patient tolerated treatment well  Patient left in chair;with call bell/phone within reach;with bed alarm set  Nurse Communication  (ready for d/c)   PT - Assessment/Plan   PT Plan Current plan remains appropriate  PT Visit Diagnosis Difficulty in walking, not elsewhere classified (R26.2)  PT Frequency (ACUTE ONLY) 7X/week  Follow Up Recommendations Follow surgeon's recommendation for DC plan and follow-up therapies  PT equipment None recommended by PT  AM-PAC PT "6 Clicks" Mobility Outcome Measure (Version 2)  Help needed turning from your back to your side while in a flat bed without using bedrails? 3  Help needed moving from lying on your back to sitting on the side of a flat bed without using bedrails? 3  Help needed moving to and from a bed to a chair (including a wheelchair)? 3  Help needed standing up from a chair using your arms (e.g., wheelchair or bedside chair)? 3  Help needed to walk in hospital room? 3  Help needed climbing 3-5 steps with a railing?  3  6 Click Score 18  Consider Recommendation of Discharge To: [facility] with [facility]  PT Goal Progression  Progress towards PT goals Progressing toward goals  Acute Rehab PT Goals  PT Goal Formulation With patient  Time For Goal Achievement 01/28/18  PT Time Calculation  PT Start Time (ACUTE ONLY) 1200  PT Stop Time (ACUTE ONLY) 1218  PT Time Calculation (min) (ACUTE ONLY) 18 min  PT General Charges  $$ ACUTE PT VISIT 1 Visit  PT Treatments  $Gait Training 8-22 mins

## 2018-01-21 NOTE — Evaluation (Signed)
Physical Therapy Evaluation Patient Details Name: Victoria [name] MRN: [id number] DOB: [birth year] Today's Date: 01/21/2018   History of Present Illness  s/p L TKA; Hx: bil THAs  Clinical Impression  Pt is s/p TKA resulting in the deficits listed below (see PT Problem List).  Pt will benefit from skilled PT to increase their independence and safety with mobility to allow discharge to the venue listed below.      Follow Up Recommendations Follow surgeon's recommendation for DC plan and follow-up therapies    Equipment Recommendations  None recommended by PT    Recommendations for Other Services       Precautions / Restrictions Precautions Precautions: Fall Restrictions Weight Bearing Restrictions: No      Mobility  Bed Mobility Overal bed mobility: Needs Assistance Bed Mobility: Supine to Sit     Supine to sit: Min guard     General bed mobility comments: to lower LLE  Transfers Overall transfer level: Needs assistance Equipment used: Rolling walker (2 wheeled) Transfers: Sit to/from Stand Sit to Stand: Min guard         General transfer comment: cues for hand placement  Ambulation/Gait Ambulation/Gait assistance: Min guard Gait Distance (Feet): 120 Feet Assistive device: Rolling walker (2 wheeled) Gait Pattern/deviations: Step-to pattern;Decreased weight shift to left     General Gait Details: cues for sequence, RW position  Stairs            Wheelchair Mobility    Modified Rankin (Stroke Patients Only)       Balance                                             Pertinent Vitals/Pain Pain Assessment: 0-10 Pain Score: 4  Pain Location: L knee Pain Descriptors / Indicators: Discomfort Pain Intervention(s): Limited activity within patient's tolerance;Monitored during session    Home Living Family/patient expects to be discharged to:: Private residence Living Arrangements: Spouse/significant other Available  Help at Discharge: Family Type of Home: House Home Access: Stairs to enter Entrance Stairs-Rails: Psychiatric nurse of Steps: 5 Home Layout: Bed/bath upstairs;Two level Home Equipment: Walker - 2 wheels;Cane - quad      Prior Function Level of Independence: Independent         Comments: using RW in am at times, "walking stick longer distances"     Hand Dominance        Extremity/Trunk Assessment   Upper Extremity Assessment Upper Extremity Assessment: Overall WFL for tasks assessed    Lower Extremity Assessment Lower Extremity Assessment: LLE deficits/detail LLE Deficits / Details: ankle WFL; knee and hip grossly 3/5       Communication   Communication: No difficulties  Cognition Arousal/Alertness: Awake/alert Behavior During Therapy: WFL for tasks assessed/performed Overall Cognitive Status: Within Functional Limits for tasks assessed                                        General Comments      Exercises Total Joint Exercises Ankle Circles/Pumps: AROM;Both;10 reps   Assessment/Plan    PT Assessment Patient needs continued PT services  PT Problem List Decreased strength;Decreased mobility;Decreased activity tolerance;Decreased knowledge of use of DME;Pain       PT Treatment Interventions Functional mobility training;Gait training;DME instruction;Therapeutic activities;Therapeutic exercise;Patient/family education;Stair training  PT Goals (Current goals can be found in the Care Plan section)  Acute Rehab PT Goals PT Goal Formulation: With patient Time For Goal Achievement: 01/28/18 Potential to Achieve Goals: Good    Frequency 7X/week   Barriers to discharge        Co-evaluation               AM-PAC PT "6 Clicks" Mobility  Outcome Measure Help needed turning from your back to your side while in a flat bed without using bedrails?: A Little Help needed moving from lying on your back to sitting on the side  of a flat bed without using bedrails?: A Little Help needed moving to and from a bed to a chair (including a wheelchair)?: A Little Help needed standing up from a chair using your arms (e.g., wheelchair or bedside chair)?: A Little Help needed to walk in hospital room?: A Little Help needed climbing 3-5 steps with a railing? : A Little 6 Click Score: 18    End of Session Equipment Utilized During Treatment: Gait belt Activity Tolerance: Patient tolerated treatment well Patient left: in chair;with call bell/phone within reach;with bed alarm set   PT Visit Diagnosis: Difficulty in walking, not elsewhere classified (R26.2)    Time: 4680-3212 PT Time Calculation (min) (ACUTE ONLY): 23 min   Charges:   PT Evaluation $PT Eval Low Complexity: 1 Low PT Treatments $Gait Training: 8-22 mins        Kenyon Ana, PT  Pager: [phone] Acute Rehab Dept [facility]): [phone]   01/21/2018   [facility] 01/21/2018, 11:30 AM

## 2018-01-21 NOTE — Progress Notes (Addendum)
Subjective: 1 Day Post-Op Procedure(s) (LRB): LEFT TOTAL KNEE ARTHROPLASTY (Left) Patient reports pain as mild.   Patient seen in rounds by Dr. Wynelle Link. Patient is well, and has had no acute complaints or problems other than pain in the left knee. Denies chest pain, SOB, or calf pain. No issues overnight. Foley catheter removed this AM. We will start therapy today.   Objective: Vital signs in last 24 hours: Temp:  [97.4 F (36.3 C)-98.4 F (36.9 C)] 97.6 F (36.4 C) (11/26 0558) Pulse Rate:  [66-87] 74 (11/26 0558) Resp:  [7-22] 17 (11/26 0558) BP: (92-156)/(63-105) 106/72 (11/26 0558) SpO2:  [97 %-100 %] 100 % (11/26 0558) Weight:  [102.6 kg] 102.6 kg (11/25 1052)  Intake/Output from previous day:  Intake/Output Summary (Last 24 hours) at 01/21/2018 0715 Last data filed at 01/21/2018 0600 Gross per 24 hour  Intake 3851.98 ml  Output 3065 ml  Net 786.98 ml    Labs: Recent Labs    01/21/18 0453  HGB 10.0*   Recent Labs    01/21/18 0453  WBC 8.1  RBC 3.36*  HCT 32.7*  PLT 242   Recent Labs    01/21/18 0453  NA 140  K 4.6  CL 108  CO2 22  BUN 10  CREATININE 0.88  GLUCOSE 111*  CALCIUM 8.2*   Exam: General - Patient is Alert and Oriented Extremity - Neurologically intact Neurovascular intact Sensation intact distally Dorsiflexion/Plantar flexion intact Dressing - dressing C/D/I Motor Function - intact, moving foot and toes well on exam.   Past Medical History:  Diagnosis Date  . Chronic leukopenia followed by pcp (previously followed by hematology-- dr Alvy Bimler)   intermittant since 2010  . Degenerative joint disease of low back 09/02/15    Dr. Maureen Ralphs  . Fibrocystic breast   . GERD (gastroesophageal reflux disease)   . Headache    per pt more stress/tension  . Heart palpitations    SEE EPIC ENCOUTNER , CARDIOLOGY DR. Tressia Miners TURNER 2018; reports on 05-16-17 " i haven't had any bouts of those lately"    . Hemorrhoids   . History of Clostridium  difficile infection 12/2010  . History of exercise intolerance    ETT on 04-27-2016-- negative ([facility] treadmill score 7)  . History of Helicobacter pylori infection 2001 and 09/ 2012  . HSV-2 infection    genital  . Hx of adenomatous colonic polyps 10/17/2007  . Hydradenitis    per pt currently treated with Humira injection  . Hypothyroidism, postsurgical 1980  . Osteoarthritis   . Pernicious anemia    b12 def  . PONV (postoperative nausea and vomiting)   . 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 Varicose veins   . Vitamin B 12 deficiency   . Vitamin D deficiency     Assessment/Plan: 1 Day Post-Op Procedure(s) (LRB): LEFT TOTAL KNEE ARTHROPLASTY (Left) Principal Problem:   OA (osteoarthritis) of knee  Estimated body mass index is 35.43 kg/m as calculated from the following:   Height as of this encounter: 5\' 7"  (1.702 m).   Weight as of this encounter: 102.6 kg. Advance diet Up with therapy D/C IV fluids  Anticipated LOS equal to or greater than 2 midnights due to - Age 27 and older with one or more of the following:  - Obesity  - Expected need for hospital services (PT, OT, Nursing) required for safe  discharge  - Anticipated need  for postoperative skilled nursing care or inpatient rehab  - Active co-morbidities: Chronic pain requiring opiods OR   - Unanticipated findings during/Post Surgery: None  - Patient is a high risk of re-admission due to: None    DVT Prophylaxis - Xarelto Weight bearing as tolerated. D/C O2 and pulse ox and try on room air. Hemovac pulled without difficulty, will begin therapy today.  Plan is to go Home after hospital stay. Possible discharge this afternoon if progresses with therapy and meeting her goals. Scheduled for outpatient physical therapy at [facility]. Follow-up in the office in 2 weeks with Dr. Wynelle Link.  Theresa Duty, PA-C Orthopedic  Surgery 01/21/2018, 7:15 AM

## 2018-01-21 NOTE — Progress Notes (Signed)
Discharge paperwork discussed with pt at the bedside. She demonstrated understanding. Pt was escorted by wheelchair in stable condition to main lobby.

## 2018-01-21 NOTE — Plan of Care (Signed)
Pt alert and oriented, feeling better this am and pain more controlled. Work with therapy. RN will monitor.

## 2018-01-22 ENCOUNTER — Other Ambulatory Visit: Payer: Self-pay | Admitting: *Deleted

## 2018-01-22 NOTE — Patient Outreach (Addendum)
[facility]) Care Management  01/22/2018  Capri Veals 1960-07-14 [id number]  Transition of care  Referral date:01/21/18 Referral source:Hospital Business Intelligence (BI) Report Insurance:[employer]  Patient admitted to [facility] on 01/20/18 for left total knee replacement and discharged on 11/26.  Subjective:  Reached patient via mobile number however patient states she does not feel like taking at this time and requests call back at another time.  Ensured that patient has prescribed pain medication and phone number for surgeon and help available. Plan:  Attempt another outreach after the Thanksgiving Holiday to complete transition of care assessment.Barrington Ellison RN,CCM,CDE [place] Management Coordinator Office Phone [phone] Office Fax [phone]

## 2018-01-27 MED FILL — CYCLOBENZAPRINE HCL 10 MG T: 10 | 13 days supply | Qty: 40 | Fill #0

## 2018-01-27 NOTE — Discharge Summary (Signed)
Physician Discharge Summary   Patient ID: Victoria [name] MRN: [id number] DOB/AGE: 1960-07-26 57 y.o.  Admit date: 01/20/2018 Discharge date: 01/21/2018  Primary Diagnosis: Osteoarthritis, left knee   Admission Diagnoses:  Past Medical History:  Diagnosis Date  . Chronic leukopenia followed by pcp (previously followed by hematology-- dr Alvy Bimler)   intermittant since 2010  . Degenerative joint disease of low back 09/02/15    Dr. Maureen Ralphs  . Fibrocystic breast   . GERD (gastroesophageal reflux disease)   . Headache    per pt more stress/tension  . Heart palpitations    SEE EPIC ENCOUTNER , CARDIOLOGY DR. Tressia Miners TURNER 2018; reports on 05-16-17 " i haven't had any bouts of those lately"    . Hemorrhoids   . History of Clostridium difficile infection 12/2010  . History of exercise intolerance    ETT on 04-27-2016-- negative ([facility] treadmill score 7)  . History of Helicobacter pylori infection 2001 and 09/ 2012  . HSV-2 infection    genital  . Hx of adenomatous colonic polyps 10/17/2007  . Hydradenitis    per pt currently treated with Humira injection  . Hypothyroidism, postsurgical 1980  . Osteoarthritis   . Pernicious anemia    b12 def  . PONV (postoperative nausea and vomiting)   . 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 Varicose veins   . Vitamin B 12 deficiency   . Vitamin D deficiency    Discharge Diagnoses:   Principal Problem:   OA (osteoarthritis) of knee  Estimated body mass index is 35.43 kg/m as calculated from the following:   Height as of this encounter: 5' 7"  (1.702 m).   Weight as of this encounter: 102.6 kg.  Procedure:  Procedure(s) (LRB): LEFT TOTAL KNEE ARTHROPLASTY (Left)   Consults: None  HPI: Victoria [name] is a 57 y.o. year old female with end stage OA of her left knee with progressively worsening pain and dysfunction. She has constant pain,  with activity and at rest and significant functional deficits with difficulties even with ADLs. She has had extensive non-op management including analgesics, injections of cortisone and viscosupplements, and home exercise program, but remains in significant pain with significant dysfunction. Radiographs show bone on bone arthritis lateral. She presents now for left Total Knee Arthroplasty.  Laboratory Data: Admission on 01/20/2018, Discharged on 01/21/2018  Component Date Value Ref Range Status  . WBC 01/21/2018 8.1  4.0 - 10.5 K/uL Final  . RBC 01/21/2018 3.36* 3.87 - 5.11 MIL/uL Final  . Hemoglobin 01/21/2018 10.0* 12.0 - 15.0 g/dL Final  . HCT 01/21/2018 32.7* 36.0 - 46.0 % Final  . MCV 01/21/2018 97.3  80.0 - 100.0 fL Final  . MCH 01/21/2018 29.8  26.0 - 34.0 pg Final  . MCHC 01/21/2018 30.6  30.0 - 36.0 g/dL Final  . RDW 01/21/2018 15.0  11.5 - 15.5 % Final  . Platelets 01/21/2018 242  150 - 400 K/uL Final  . nRBC 01/21/2018 0.0  0.0 - 0.2 % Final   Performed at [facility], [place] [address] [place], [place] [address]  . Sodium 01/21/2018 140  135 - 145 mmol/L Final  . Potassium 01/21/2018 4.6  3.5 - 5.1 mmol/L Final  . Chloride 01/21/2018 108  98 - 111 mmol/L Final  . CO2 01/21/2018 22  22 - 32 mmol/L Final  . Glucose, Bld 01/21/2018 111* 70 - 99 mg/dL Final  . BUN  01/21/2018 10  6 - 20 mg/dL Final  . Creatinine, Ser 01/21/2018 0.88  0.44 - 1.00 mg/dL Final  . Calcium 01/21/2018 8.2* 8.9 - 10.3 mg/dL Final  . GFR calc non Af Amer 01/21/2018 >60  >60 mL/min Final  . GFR calc Af Amer 01/21/2018 >60  >60 mL/min Final   Comment: (NOTE) The eGFR has been calculated using the CKD EPI equation. This calculation has not been validated in all clinical situations. eGFR's persistently <60 mL/min signify possible Chronic Kidney Disease.   Georgiann Hahn gap 01/21/2018 10  5 - 15 Final   Performed at [facility], [place] [address], [place], [place] [address]    Hospital Outpatient Visit on 01/17/2018  Component Date Value Ref Range Status  . MRSA, PCR 01/17/2018 NEGATIVE  NEGATIVE Final  . Staphylococcus aureus 01/17/2018 NEGATIVE  NEGATIVE Final   Comment: (NOTE) The Xpert SA Assay (FDA approved for NASAL specimens in patients 92 years of age and older), is one component of a comprehensive surveillance program. It is not intended to diagnose infection nor to guide or monitor treatment. Performed at [facility], [place] [address] [place], [place] [address]   . aPTT 01/17/2018 32  24 - 36 seconds Final   Performed at [facility], [place] [address], [place], [place] [address]  . WBC 01/17/2018 3.4* 4.0 - 10.5 K/uL Final  . RBC 01/17/2018 3.99  3.87 - 5.11 MIL/uL Final  . Hemoglobin 01/17/2018 11.9* 12.0 - 15.0 g/dL Final  . HCT 01/17/2018 37.9  36.0 - 46.0 % Final  . MCV 01/17/2018 95.0  80.0 - 100.0 fL Final  . MCH 01/17/2018 29.8  26.0 - 34.0 pg Final  . MCHC 01/17/2018 31.4  30.0 - 36.0 g/dL Final  . RDW 01/17/2018 15.1  11.5 - 15.5 % Final  . Platelets 01/17/2018 243  150 - 400 K/uL Final  . nRBC 01/17/2018 0.0  0.0 - 0.2 % Final   Performed at [facility], [place] [address] [place], [place] [address]  . Sodium 01/17/2018 142  135 - 145 mmol/L Final  . Potassium 01/17/2018 4.5  3.5 - 5.1 mmol/L Final  . Chloride 01/17/2018 107  98 - 111 mmol/L Final  . CO2 01/17/2018 27  22 - 32 mmol/L Final  . Glucose, Bld 01/17/2018 89  70 - 99 mg/dL Final  . BUN 01/17/2018 19  6 - 20 mg/dL Final  . Creatinine, Ser 01/17/2018 0.87  0.44 - 1.00 mg/dL Final  . Calcium 01/17/2018 9.4  8.9 - 10.3 mg/dL Final  . Total Protein 01/17/2018 8.1  6.5 - 8.1 g/dL Final  . Albumin 01/17/2018 4.1  3.5 - 5.0 g/dL Final  . AST 01/17/2018 25  15 - 41 U/L Final  . ALT 01/17/2018 27  0 - 44 U/L Final  . Alkaline Phosphatase 01/17/2018 66  38 - 126 U/L Final  . Total Bilirubin 01/17/2018 0.8  0.3 - 1.2 mg/dL Final   . GFR calc non Af Amer 01/17/2018 >60  >60 mL/min Final  . GFR calc Af Amer 01/17/2018 >60  >60 mL/min Final   Comment: (NOTE) The eGFR has been calculated using the CKD EPI equation. This calculation has not been validated in all clinical situations. eGFR's persistently <60 mL/min signify possible Chronic Kidney Disease.   Georgiann Hahn gap 01/17/2018 8  5 - 15 Final   Performed at [facility], [place] [address] [place], [place] [address]  .  Prothrombin Time 01/17/2018 12.8  11.4 - 15.2 seconds Final  . INR 01/17/2018 0.98   Final   Performed at [facility], [place] [address], [place], [place] [address]  . ABO/RH(D) 01/17/2018 A POS   Final  . Antibody Screen 01/17/2018 NEG   Final  . Sample Expiration 01/17/2018 01/23/2018   Final  . Extend sample reason 01/17/2018    Final                   Value:NO TRANSFUSIONS OR PREGNANCY IN THE PAST 3 MONTHS Performed at [place] [address], [place], [place] [address]   Office Visit on 01/09/2018  Component Date Value Ref Range Status  . Testosterone, total 01/09/2018 19.5  ng/dL Final   Comment:                          Female:                           Premenopausal    10.0 - 55.0                           Postmenopausal    7.0 - 40.0   . Testosterone, Free 01/09/2018 1.0  0.0 - 4.2 pg/mL Final  . Sex Hormone Binding 01/09/2018 59.4  17.3 - 125.0 nmol/L Final  . Atopobium vaginae 01/09/2018 Low - 0  Score Final  . BVAB 2 01/09/2018 Low - 0  Score Final  . Megasphaera 1 01/09/2018 Low - 0  Score Final   Comment: Calculate total score by adding the 3 individual bacterial vaginosis (BV) marker scores together.  Total score is interpreted as follows: Total score 0-1: Indicates the absence of BV. Total score   2: Indeterminate for BV. Additional clinical                  data should be evaluated to establish a                  diagnosis. Total score 3-6: Indicates the presence of  BV. This test was developed and its performance characteristics determined by [facility].  It has not been cleared or approved by the Food and Drug Administration.  The FDA has determined that such clearance or approval is not necessary.   . Candida albicans, NAA 01/09/2018 Negative  Negative Final  . Candida glabrata, NAA 01/09/2018 Negative  Negative Final   Comment: This test was developed and its performance characteristics determined by [facility].  It has not been cleared or approved by the Food and Drug Administration.  The FDA has determined that such clearance or approval is not necessary.   Dalbert Batman vag by NAA 01/09/2018 Negative  Negative Final  . Chlamydia trachomatis, NAA 01/09/2018 Negative  Negative Final  . Neisseria gonorrhoeae, NAA 01/09/2018 Negative  Negative Final     X-Rays:No results found.  EKG: Orders placed or performed in visit on 04/12/16  . EKG 12-Lead     Hospital Course: Victoria [name] is a 57 y.o. who was admitted to [facility]. They were brought to the operating room on 01/20/2018 and underwent Procedure(s): LEFT TOTAL KNEE ARTHROPLASTY.  Patient tolerated the procedure well and was later transferred to the recovery room and then to the orthopaedic floor for postoperative care. They were given PO and IV analgesics for pain  control following their surgery. They were given 24 hours of postoperative antibiotics of  Anti-infectives (From admission, onward)   Start     Dose/Rate Route Frequency Ordered Stop   01/20/18 1900  ceFAZolin (ANCEF) IVPB 2g/100 mL premix     2 g 200 mL/hr over 30 Minutes Intravenous Every 6 hours 01/20/18 1551 01/21/18 0322   01/20/18 1551  valACYclovir (VALTREX) tablet 500 mg  Status:  Discontinued     500 mg Oral Daily PRN 01/20/18 1551 01/21/18 1544   01/20/18 1015  ceFAZolin (ANCEF) IVPB 2g/100 mL premix     2 g 200 mL/hr over 30 Minutes Intravenous On call to O.R. 01/20/18 1002 01/20/18 1314     and started  on DVT prophylaxis in the form of Xarelto.   PT and OT were ordered for total joint protocol. Discharge planning consulted to help with postop disposition and equipment needs.  Patient had a good night on the evening of surgery. They started to get up OOB with therapy on POD #0. Pt was seen during rounds and was ready to go home pending progress with therapy. Hemovac drain was pulled without difficulty. She worked with therapy on POD #1 and was meeting her goals. Pt was discharged to home later that day in stable condition.  Diet: Regular diet Activity: WBAT Follow-up: in 2 weeks with Dr. Wynelle Link Disposition: Home with outpatient PT at [facility] Discharged Condition: stable   Discharge Instructions    Call MD / Call 911   Complete by:  As directed    If you experience chest pain or shortness of breath, CALL 911 and be transported to the hospital emergency room.  If you develope a fever above 101 F, pus (white drainage) or increased drainage or redness at the wound, or calf pain, call your surgeon's office.   Change dressing   Complete by:  As directed    Change dressing on Wednesday, then change the dressing daily with sterile 4 x 4 inch gauze dressing and apply TED hose.   Constipation Prevention   Complete by:  As directed    Drink plenty of fluids.  Prune juice may be helpful.  You may use a stool softener, such as Colace (over the counter) 100 mg twice a day.  Use MiraLax (over the counter) for constipation as needed.   Diet - low sodium heart healthy   Complete by:  As directed    Discharge instructions   Complete by:  As directed    Dr. Gaynelle Arabian Total Joint Specialist [facility] [address] [place], [place] [address] [phone]  TOTAL KNEE REPLACEMENT POSTOPERATIVE DIRECTIONS  Knee Rehabilitation, Guidelines Following Surgery  Results after knee surgery are often greatly improved when you follow the exercise, range of motion and muscle strengthening  exercises prescribed by your doctor. Safety measures are also important to protect the knee from further injury. Any time any of these exercises cause you to have increased pain or swelling in your knee joint, decrease the amount until you are comfortable again and slowly increase them. If you have problems or questions, call your caregiver or physical therapist for advice.   HOME CARE INSTRUCTIONS  Remove items at home which could result in a fall. This includes throw rugs or furniture in walking pathways.  ICE to the affected knee every three hours for 30 minutes at a time and then as needed for pain and swelling.  Continue to use ice on the knee for pain and  swelling from surgery. You may notice swelling that will progress down to the foot and ankle.  This is normal after surgery.  Elevate the leg when you are not up walking on it.   Continue to use the breathing machine which will help keep your temperature down.  It is common for your temperature to cycle up and down following surgery, especially at night when you are not up moving around and exerting yourself.  The breathing machine keeps your lungs expanded and your temperature down. Do not place pillow under knee, focus on keeping the knee straight while resting   DIET You may resume your previous home diet once your are discharged from the hospital.  DRESSING / WOUND CARE / SHOWERING You may shower 3 days after surgery, but keep the wounds dry during showering.  You may use an occlusive plastic wrap (Press'n Seal for example), NO SOAKING/SUBMERGING IN THE BATHTUB.  If the bandage gets wet, change with a clean dry gauze.  If the incision gets wet, pat the wound dry with a clean towel. You may start showering once you are discharged home but do not submerge the incision under water. Just pat the incision dry and apply a dry gauze dressing on daily. Change the surgical dressing daily and reapply a dry dressing each time.  ACTIVITY Walk with  your walker as instructed. Use walker as long as suggested by your caregivers. Avoid periods of inactivity such as sitting longer than an hour when not asleep. This helps prevent blood clots.  You may resume a sexual relationship in one month or when given the OK by your doctor.  You may return to work once you are cleared by your doctor.  Do not drive a car for 6 weeks or until released by you surgeon.  Do not drive while taking narcotics.  WEIGHT BEARING Weight bearing as tolerated with assist device (walker, cane, etc) as directed, use it as long as suggested by your surgeon or therapist, typically at least 4-6 weeks.  POSTOPERATIVE CONSTIPATION PROTOCOL Constipation - defined medically as fewer than three stools per week and severe constipation as less than one stool per week.  One of the most common issues patients have following surgery is constipation.  Even if you have a regular bowel pattern at home, your normal regimen is likely to be disrupted due to multiple reasons following surgery.  Combination of anesthesia, postoperative narcotics, change in appetite and fluid intake all can affect your bowels.  In order to avoid complications following surgery, here are some recommendations in order to help you during your recovery period.  Colace (docusate) - Pick up an over-the-counter form of Colace or another stool softener and take twice a day as long as you are requiring postoperative pain medications.  Take with a full glass of water daily.  If you experience loose stools or diarrhea, hold the colace until you stool forms back up.  If your symptoms do not get better within 1 week or if they get worse, check with your doctor.  Dulcolax (bisacodyl) - Pick up over-the-counter and take as directed by the product packaging as needed to assist with the movement of your bowels.  Take with a full glass of water.  Use this product as needed if not relieved by Colace only.   MiraLax (polyethylene  glycol) - Pick up over-the-counter to have on hand.  MiraLax is a solution that will increase the amount of water in your bowels to assist with bowel  movements.  Take as directed and can mix with a glass of water, juice, soda, coffee, or tea.  Take if you go more than two days without a movement. Do not use MiraLax more than once per day. Call your doctor if you are still constipated or irregular after using this medication for 7 days in a row.  If you continue to have problems with postoperative constipation, please contact the office for further assistance and recommendations.  If you experience "the worst abdominal pain ever" or develop nausea or vomiting, please contact the office immediatly for further recommendations for treatment.  ITCHING  If you experience itching with your medications, try taking only a single pain pill, or even half a pain pill at a time.  You can also use Benadryl over the counter for itching or also to help with sleep.   TED HOSE STOCKINGS Wear the elastic stockings on both legs for three weeks following surgery during the day but you may remove then at night for sleeping.  MEDICATIONS See your medication summary on the "After Visit Summary" that the nursing staff will review with you prior to discharge.  You may have some home medications which will be placed on hold until you complete the course of blood thinner medication.  It is important for you to complete the blood thinner medication as prescribed by your surgeon.  Continue your approved medications as instructed at time of discharge.  PRECAUTIONS If you experience chest pain or shortness of breath - call 911 immediately for transfer to the hospital emergency department.  If you develop a fever greater that 101 F, purulent drainage from wound, increased redness or drainage from wound, foul odor from the wound/dressing, or calf pain - CONTACT YOUR SURGEON.                                                     FOLLOW-UP APPOINTMENTS Make sure you keep all of your appointments after your operation with your surgeon and caregivers. You should call the office at the above phone number and make an appointment for approximately two weeks after the date of your surgery or on the date instructed by your surgeon outlined in the "After Visit Summary".   RANGE OF MOTION AND STRENGTHENING EXERCISES  Rehabilitation of the knee is important following a knee injury or an operation. After just a few days of immobilization, the muscles of the thigh which control the knee become weakened and shrink (atrophy). Knee exercises are designed to build up the tone and strength of the thigh muscles and to improve knee motion. Often times heat used for twenty to thirty minutes before working out will loosen up your tissues and help with improving the range of motion but do not use heat for the first two weeks following surgery. These exercises can be done on a training (exercise) mat, on the floor, on a table or on a bed. Use what ever works the best and is most comfortable for you Knee exercises include:  Leg Lifts - While your knee is still immobilized in a splint or cast, you can do straight leg raises. Lift the leg to 60 degrees, hold for 3 sec, and slowly lower the leg. Repeat 10-20 times 2-3 times daily. Perform this exercise against resistance later as your knee gets better.  Quad and Hamstring  Sets - Tighten up the muscle on the front of the thigh (Quad) and hold for 5-10 sec. Repeat this 10-20 times hourly. Hamstring sets are done by pushing the foot backward against an object and holding for 5-10 sec. Repeat as with quad sets.  Leg Slides: Lying on your back, slowly slide your foot toward your buttocks, bending your knee up off the floor (only go as far as is comfortable). Then slowly slide your foot back down until your leg is flat on the floor again. Angel Wings: Lying on your back spread your legs to the side as far apart  as you can without causing discomfort.  A rehabilitation program following serious knee injuries can speed recovery and prevent re-injury in the future due to weakened muscles. Contact your doctor or a physical therapist for more information on knee rehabilitation.   IF YOU ARE TRANSFERRED TO A SKILLED REHAB FACILITY If the patient is transferred to a skilled rehab facility following release from the hospital, a list of the current medications will be sent to the facility for the patient to continue.  When discharged from the skilled rehab facility, please have the facility set up the patient's [place] prior to being released. Also, the skilled facility will be responsible for providing the patient with their medications at time of release from the facility to include their pain medication, the muscle relaxants, and their blood thinner medication. If the patient is still at the rehab facility at time of the two week follow up appointment, the skilled rehab facility will also need to assist the patient in arranging follow up appointment in our office and any transportation needs.  MAKE SURE YOU:  Understand these instructions.  Get help right away if you are not doing well or get worse.    Pick up stool softner and laxative for home use following surgery while on pain medications. Do not submerge incision under water. Please use good hand washing techniques while changing dressing each day. May shower starting three days after surgery. Please use a clean towel to pat the incision dry following showers. Continue to use ice for pain and swelling after surgery. Do not use any lotions or creams on the incision until instructed by your surgeon.   Do not put a pillow under the knee. Place it under the heel.   Complete by:  As directed    Driving restrictions   Complete by:  As directed    No driving for two weeks   TED hose   Complete by:  As directed    Use stockings (TED  hose) for three weeks on both leg(s).  You may remove them at night for sleeping.   Weight bearing as tolerated   Complete by:  As directed      Allergies as of 01/21/2018      Reactions   Estradiol Rash, Other (See Comments)   Local rash with estradiol patches      Medication List    STOP taking these medications   cyanocobalamin 1000 MCG/ML injection Commonly known as:  (VITAMIN B-12)   cyclobenzaprine 10 MG tablet Commonly known as:  FLEXERIL   Estradiol 1 MG/GM Gel   HYDROcodone-acetaminophen 10-325 MG tablet Commonly known as:  NORCO     TAKE these medications   Adalimumab 40 MG/0.4ML Pnkt Inject 40 mg into the skin once a week. Starting on day 29 Notes to patient:  Take as prescribed   ALPRAZolam 0.5 MG tablet Commonly  known as:  XANAX TAKE 1 TABLET BY MOUTH AT BEDTIME What changed:    when to take this  reasons to take this   estradiol 0.1 MG/GM vaginal cream Commonly known as:  ESTRACE Place 1 Applicatorful vaginally 2 (two) times a week. Notes to patient:  Take as prescribed   gabapentin 300 MG capsule Commonly known as:  NEURONTIN Take 1 capsule (300 mg total) by mouth 3 (three) times daily. Gabapentin 300 mg Protocol Take a 300 mg capsule three times a day for two weeks following surgery. Then take a 300 mg capsule two times a day for two weeks.  Then take a 300 mg capsule once a day for two weeks.  Then discontinue the Gabapentin.   levothyroxine 100 MCG tablet Commonly known as:  SYNTHROID, LEVOTHROID TAKE 1 TABLET BY MOUTH ONCE DAILY What changed:  when to take this   LINZESS 290 MCG Caps capsule Generic drug:  linaclotide Take 290 mcg by mouth daily as needed (for constipation).   methocarbamol 500 MG tablet Commonly known as:  ROBAXIN Take 1 tablet (500 mg total) by mouth every 6 (six) hours as needed for muscle spasms.   oxyCODONE 5 MG immediate release tablet Commonly known as:  Oxy IR/ROXICODONE Take 1-2 tablets (5-10 mg total) by  mouth every 6 (six) hours as needed for moderate pain (pain score 4-6).   pantoprazole 40 MG tablet Commonly known as:  PROTONIX TAKE 1 TABLET BY MOUTH TWICE DAILY--- PRN What changed:    how much to take  how to take this  when to take this  reasons to take this  additional instructions   polyethylene glycol packet Commonly known as:  MIRALAX / GLYCOLAX Take 17 g by mouth daily as needed for mild constipation.   promethazine 25 MG tablet Commonly known as:  PHENERGAN TAKE 1 TABLET BY MOUTH EVERY 8 HOURS AS NEEDED FOR NAUSEA AND VOMITING What changed:  See the new instructions.   rivaroxaban 10 MG Tabs tablet Commonly known as:  XARELTO Take 1 tablet (10 mg total) by mouth daily with breakfast for 20 days. Take one 10 mg xarelto once a day for three weeks following surgery to prevent blood clots. Then take one 81 mg aspirin once a day for three weeks. Then discontinue aspirin.   spironolactone 25 MG tablet Commonly known as:  ALDACTONE Take 1 tablet (25 mg total) by mouth 2 (two) times daily. Increase to 75m BID as directed.   valACYclovir 500 MG tablet Commonly known as:  VALTREX TAKE 1 TABLET BY MOUTH ONCE DAILY PRN What changed:    how much to take  how to take this  when to take this  reasons to take this            Discharge Care Instructions  (From admission, onward)         Start     Ordered   01/21/18 0000  Weight bearing as tolerated     01/21/18 0721   01/21/18 0000  Change dressing    Comments:  Change dressing on Wednesday, then change the dressing daily with sterile 4 x 4 inch gauze dressing and apply TED hose.   01/21/18 06270        Follow-up Information    AGaynelle Arabian MD. Schedule an appointment as soon as possible for a visit on 02/04/2018.   Specialty:  Orthopedic Surgery Contact information: [address] [place] [phone]  Signed: Theresa Duty, PA-C Orthopedic  Surgery 01/27/2018, 8:07 AM

## 2018-01-28 ENCOUNTER — Other Ambulatory Visit: Payer: Self-pay | Admitting: *Deleted

## 2018-01-28 ENCOUNTER — Telehealth: Payer: Self-pay

## 2018-01-28 ENCOUNTER — Encounter: Payer: Self-pay | Admitting: *Deleted

## 2018-01-28 NOTE — Patient Outreach (Addendum)
[facility]) Care Management  01/28/2018  Victoria [name] November 02, 1960 [id number]   Subjective: Telephone call to patient's home / mobile number, spoke with patient, and HIPAA verified.  Discussed [facility] Care Management Focus Plan Transition of care follow up, patient voiced understanding, and is in agreement to follow up.  Patient states she is feeling crappy, pain regimen is not managing her pain approximately, states she is planning to follow up with surgeon's office regarding this issue today after completion of this follow up call.  States she contact surgeon's office via my chart message on 01/27/18 regarding her pain issue, provider called in a prescription for muscle relaxer, has tried muscle relaxer for 24 hours, still not managing pain, and is planning to request Dilaudid which has managed her pain appropriately in the past.   States she has been having problems with left knee for awhile, had 2 surgeries in the past 7 months, knee was bone on bone, and mostly recently has a knee replacement.   Patient states she has a follow up appointment with surgeon on 02/04/18.   States she is attending outpatient physical therapy at MD's office and going well.   Discussed importance of hospital follow up with primary MD, patient voices understanding, and states she will follow up as appropriate.   Patient states she is able to manage self care and has assistance as needed. Patient voices understanding of medical diagnosis, surgery, and treatment plan.  States she is accessing the following [facility] benefits: outpatient pharmacy, long term disability, hospital indemnity (verbally given contact number for [employer] [phone], will file claim if appropriate, verbally given contact number for [place] [place] [phone] to request itemized bill), has exhausted family medical leave act (FMLA), and has work accommodation in place.   States she has spoken with [employer]  regarding outpatient physical therapy co-payments,  insurance company has left a message with the provider to advise, deductible / out of pocket max met, and no co-payments  due.  Patient states she does not have any education material, transition of care, care coordination, disease management, disease monitoring, transportation, community resource, or pharmacy needs at this time.  States she is very appreciative of the follow up and is in agreement to receive [name] information.       Objective:Per KPN (Knowledge Performance Now, point of care tool) and chart review, patient hospitalized 01/20/18 - 01/21/18 for Osteoarthritis, left knee, status post Left  Total Knee Arthroplasty (Depuy Attune) on 01/20/18.   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 Patient also has a history of Osteoarthritisand anemia.   Transition of care follow up completed on 06/09/17.       Assessment: Received Focus Plan Transition of care referral on 01/21/18.Transition of care follow up completed, no care management needs, and will proceed with case closure.       Plan:RNCM will send patient successful outreach letter, [name] pamphlet, and magnet. RNCM will complete case closure due to follow up completed / no care management needs.        Darlene H. Annia Friendly, BSN, [facility] Telephonic CM Phone: [phone] Fax: [phone]

## 2018-01-28 NOTE — Telephone Encounter (Signed)
Patient called to let us know she was discharged from the hospital on 01/21/2018, states her knee was bothering her but she had contacted her surgeon for a pain prescription (DILAUDID) and will start it today. Patient has a follow up with her surgeon on 02/04/2018. Patient stated she is doing PT and doing well.

## 2018-01-29 MED FILL — HYDROmorphone HCL 2 MG TABS: 2 | 7 days supply | Qty: 42 | Fill #0

## 2018-02-04 MED FILL — HYDROmorphone HCL 2 MG TABS: 2 | 7 days supply | Qty: 42 | Fill #0

## 2018-02-11 ENCOUNTER — Emergency Department (HOSPITAL_COMMUNITY)
Admission: EM | Admit: 2018-02-11 | Discharge: 2018-02-11 | Disposition: A | Discharge status: Home / Self Care | Payer: No Typology Code available for payment source | Attending: Emergency Medicine | Admitting: Emergency Medicine

## 2018-02-11 ENCOUNTER — Encounter (HOSPITAL_COMMUNITY): Payer: Self-pay

## 2018-02-11 ENCOUNTER — Other Ambulatory Visit: Payer: Self-pay

## 2018-02-11 DIAGNOSIS — Z79899 Other long term (current) drug therapy: Secondary | ICD-10-CM | POA: Insufficient documentation

## 2018-02-11 DIAGNOSIS — E860 Dehydration: Secondary | ICD-10-CM | POA: Insufficient documentation

## 2018-02-11 DIAGNOSIS — R112 Nausea with vomiting, unspecified: Secondary | ICD-10-CM | POA: Diagnosis present

## 2018-02-11 DIAGNOSIS — E039 Hypothyroidism, unspecified: Secondary | ICD-10-CM | POA: Diagnosis not present

## 2018-02-11 DIAGNOSIS — R197 Diarrhea, unspecified: Secondary | ICD-10-CM | POA: Diagnosis not present

## 2018-02-11 LAB — COMPREHENSIVE METABOLIC PANEL
ALT: 21 U/L (ref 0–44)
AST: 51 U/L — ABNORMAL HIGH (ref 15–41)
Albumin: 3.9 g/dL (ref 3.5–5.0)
Alkaline Phosphatase: 73 U/L (ref 38–126)
Anion gap: 10 (ref 5–15)
BUN: 14 mg/dL (ref 6–20)
CO2: 23 mmol/L (ref 22–32)
Calcium: 9.2 mg/dL (ref 8.9–10.3)
Chloride: 106 mmol/L (ref 98–111)
Creatinine, Ser: 0.87 mg/dL (ref 0.44–1.00)
GFR calc Af Amer: >60 mL/min (ref >60)
GFR calc non Af Amer: >60 mL/min (ref >60)
Glucose, Bld: 86 mg/dL (ref 70–99)
Potassium: 6.3 mmol/L (ref 3.5–5.1)
Sodium: 139 mmol/L (ref 135–145)
Total Bilirubin: 1.4 mg/dL — ABNORMAL HIGH (ref 0.3–1.2)
Total Protein: 7.5 g/dL (ref 6.5–8.1)

## 2018-02-11 LAB — CBC
HCT: 37.9 % (ref 36.0–46.0)
Hemoglobin: 11.2 g/dL — ABNORMAL LOW (ref 12.0–15.0)
MCH: 29.4 pg (ref 26.0–34.0)
MCHC: 29.6 g/dL — ABNORMAL LOW (ref 30.0–36.0)
MCV: 99.5 fL (ref 80.0–100.0)
Platelets: 363 10*3/uL (ref 150–400)
RBC: 3.81 MIL/uL — ABNORMAL LOW (ref 3.87–5.11)
RDW: 17.2 % — ABNORMAL HIGH (ref 11.5–15.5)
WBC: 4.1 10*3/uL (ref 4.0–10.5)
nRBC: 0 % (ref 0.0–0.2)

## 2018-02-11 LAB — LIPASE, BLOOD: Lipase: 25 U/L (ref 11–51)

## 2018-02-11 LAB — POTASSIUM: Potassium: 4.8 mmol/L (ref 3.5–5.1)

## 2018-02-11 MED ORDER — SODIUM CHLORIDE 0.9 % IV BOLUS
500.0000 mL | Freq: Once | INTRAVENOUS | Status: AC
  Administered 2018-02-11: 500 mL via INTRAVENOUS

## 2018-02-11 MED ORDER — SODIUM CHLORIDE 0.9 % IV BOLUS
1000.0000 mL | Freq: Once | INTRAVENOUS | Status: AC
  Administered 2018-02-11: 1000 mL via INTRAVENOUS

## 2018-02-11 NOTE — ED Triage Notes (Signed)
Pt states that she has been having NVD since Sunday, was seen by her GI MD who told her to come here for evaluation of dehydration.

## 2018-02-11 NOTE — ED Provider Notes (Signed)
[facility] EMERGENCY DEPARTMENT Provider Note   CSN: [id number] Arrival date & time: 02/11/18  1510     History   Chief Complaint Chief Complaint  Patient presents with  . Dehydration    HPI Shatyra Becka is a 57 y.o. female.  The history is provided by the patient and medical records. No language interpreter was used.   Korie Tarri Guilfoil is a 57 y.o. female  with a PMH as listed below who presents to the Emergency Department complaining of n/v/d. Symptoms started one week ago on 12/10. Mostly nausea and decreased appetite. She took a phenergan and pepto-bismol with no relief. She then vomited one time.  She has had no further emesis since then.  She then began having loose, nonbloody stools.  About 5 times a day.  Associated with an odor.  She has some crampy abdominal pain when eating, but when she does not eat, denies any abdominal pain.  She has not eaten anything today, therefore has not had any abdominal pain today.  She has a history of C. difficile in the past.  She has not had any recent antibiotics other than 1 dose prior to undergoing knee replacement on 11/23.  She was seen by her GI doctor, Dr. [name], today who sent her to the ER for further evaluation for concerns of dehydration. She reports that her HR was a little fast and she had not had anything to eat/drink all day, therefore Dr. [name] wanted her to come to ED for blood work and to ensure that she wasn't dehydrated.   Past Medical History:  Diagnosis Date  . Chronic leukopenia followed by pcp (previously followed by hematology-- dr Alvy Bimler)   intermittant since 2010  . Degenerative joint disease of low back 09/02/15    Dr. Maureen Ralphs  . Fibrocystic breast   . GERD (gastroesophageal reflux disease)   . Headache    per pt more stress/tension  . Heart palpitations    SEE EPIC ENCOUTNER , CARDIOLOGY DR. Tressia Miners TURNER 2018; reports on 05-16-17 " i haven't had any bouts of those lately"     . Hemorrhoids   . History of Clostridium difficile infection 12/2010  . History of exercise intolerance    ETT on 04-27-2016-- negative ([facility] treadmill score 7)  . History of Helicobacter pylori infection 2001 and 09/ 2012  . HSV-2 infection    genital  . Hx of adenomatous colonic polyps 10/17/2007  . Hydradenitis    per pt currently treated with Humira injection  . Hypothyroidism, postsurgical 1980  . Osteoarthritis   . Pernicious anemia    b12 def  . PONV (postoperative nausea and vomiting)   . 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 Varicose veins   . Vitamin B 12 deficiency   . Vitamin D deficiency     Patient Active Problem List   Diagnosis Date Noted  . OA (osteoarthritis) of knee 01/20/2018  . Acute meniscal tear, medial 07/17/2017  . Failed total hip arthroplasty ([place]) 05/22/2017  . 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 Insomnia 11/08/2016  . Pernicious anemia 11/08/2016  . Leukopenia 07/09/2016  . Hypocupremia 07/09/2016  . Palpitations 04/12/2016  . 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 Lap gastric bypass May 2016 07/06/2014  . History of Clostridium difficile infection 08/12/2011  . Postoperative hypothyroidism 08/26/2010  . Gastroesophageal reflux disease 08/26/2010  .  Vitamin D deficiency 08/26/2010  . Fibrocystic disease of breast 08/26/2010  . Cobalamin deficiency 08/26/2010  . Obesity 03/20/2010  . Backache 03/20/2010    Past Surgical History:  Procedure Laterality Date  . BREATH TEK H PYLORI N/A 05/03/2014   Procedure: BREATH TEK H PYLORI;  Surgeon: Kaylyn Lim, MD;  Location: [facility];  Service: General;  Laterality: N/A;  . CARDIOVASCULAR STRESS TEST  03/11/2013   dr Tressia Miners turner   Low risk nuclear study w/ a very small anterior perfusion defect that most likely represents breast attenuation artifact, less  likely this could represent a true perfusion abnormality in the distribution of diagonal branch of the LAD/  normal LV function and wall motion , ef 66%  . CATARACT EXTRACTION W/ INTRAOCULAR LENS  IMPLANT, BILATERAL  10/2017  . CESAREAN SECTION  1985  . CHOLECYSTECTOMY OPEN  1987  . COLONOSCOPY  02/2017   Dr [name] ; per patient they found 7 polyps with polypectomy; on 5 year track   . FINE NEEDLE ASPIRATION Left 05/22/2017   Procedure: LEFT KNEE ASPIRATION;  Surgeon: Gaynelle Arabian, MD;  Location: [facility];  Service: Orthopedics;  Laterality: Left;  Marland Kitchen GASTRIC ROUX-EN-Y N/A 07/06/2014   Procedure: LAPAROSCOPIC ROUX-EN-Y GASTRIC BYPASS, ENTEROLYSIS OF ADHESIONS WITH UPPER ENDOSCOPY;  Surgeon: Johnathan Hausen, MD;  Location: [facility];  Service: General;  Laterality: N/A;  . HAMMER TOE SURGERY Bilateral 02/17/2008   bilateral foot digit's 2 and 5  . HIATAL HERNIA REPAIR N/A 07/06/2014   Procedure: LAPAROSCOPIC REPAIR OF HIATAL HERNIA;  Surgeon: Johnathan Hausen, MD;  Location: [facility];  Service: General;  Laterality: N/A;  . KNEE ARTHROSCOPY WITH MEDIAL MENISECTOMY Left 07/17/2017   Procedure: LEFT KNEE ARTHROSCOPY WITH MEDIAL LATERAL MENISECTOMY;  Surgeon: Gaynelle Arabian, MD;  Location: [facility];  Service: Orthopedics;  Laterality: Left;  Marland Kitchen MASS EXCISION N/A 10/11/2016   Procedure: EXCISION OF PERIANAL MASS;  Surgeon: [name], MD;  Location: [facility];  Service: General;  Laterality: N/A;  . REFRACTIVE SURGERY    . THYROIDECTOMY  1980  . TOTAL ABDOMINAL HYSTERECTOMY  08-25-2002   dr Margaretha Glassing   ovaries retained  . TOTAL HIP ARTHROPLASTY Right 05/21/2012   Procedure: TOTAL HIP ARTHROPLASTY;  Surgeon: Gearlean Alf, MD;  Location: [facility];  Service: Orthopedics;  Laterality: Right;  . TOTAL HIP ARTHROPLASTY Left 01-31-2009  dr Wynelle Link  . TOTAL HIP REVISION Left 05/22/2017   Procedure: Left hip bearing surface and head revision;  Surgeon: Gaynelle Arabian, MD;  Location: [facility];  Service:  Orthopedics;  Laterality: Left;  . TOTAL KNEE ARTHROPLASTY Left 01/20/2018   Procedure: LEFT TOTAL KNEE ARTHROPLASTY;  Surgeon: Gaynelle Arabian, MD;  Location: [facility];  Service: Orthopedics;  Laterality: Left;  4min  . TRANSTHORACIC ECHOCARDIOGRAM  05/03/2016   ef 55-60%/  trivial MR/  mild TR     OB History    Gravida  2   Para  1   Term      Preterm  1   AB  1   Living  2     SAB      TAB  1   Ectopic      Multiple  1   Live Births  2            Home Medications    Prior to Admission medications   Medication Sig Start Date End Date Taking? Authorizing Provider  acetaminophen (TYLENOL) 500 MG tablet Take 1,000 mg by mouth every  6 (six) hours as needed (for pain).   Yes [provider]  Adalimumab (HUMIRA PEN) 40 MG/0.4ML PNKT Inject 40 mg into the skin once a week. Starting on day 29 Patient taking differently: Inject 40 mg into the skin every Saturday.  12/20/17  Yes Tresa Garter, MD  ALPRAZolam (XANAX) 0.5 MG tablet TAKE 1 TABLET BY MOUTH AT BEDTIME Patient taking differently: Take 0.5 mg by mouth at bedtime as needed for anxiety or sleep.  11/05/17  Yes Baxley, Cresenciano Lick, MD  cyanocobalamin (,VITAMIN B-12,) 1000 MCG/ML injection Inject 1,000 mcg into the muscle every 30 (thirty) days.   Yes [provider]  cyclobenzaprine (FLEXERIL) 10 MG tablet Take 10 mg by mouth 3 (three) times daily as needed for muscle spasms.   Yes [provider]  gabapentin (NEURONTIN) 300 MG capsule Take 1 capsule (300 mg total) by mouth 3 (three) times daily. Gabapentin 300 mg Protocol Take a 300 mg capsule three times a day for two weeks following surgery. Then take a 300 mg capsule two times a day for two weeks.  Then take a 300 mg capsule once a day for two weeks.  Then discontinue the Gabapentin. Patient taking differently: Take 300 mg by mouth See admin instructions. Take 300 mg by mouth 3 times a day for 14 days following surgery, then 300 mg two  times a day for 14 days, then 300 mg once a day for 14 days, then discontinue 01/21/18  Yes Edmisten, Kristie L, PA  HYDROmorphone (DILAUDID) 2 MG tablet Take 2-4 mg by mouth 2 (two) times daily as needed (for pain).    Yes [provider]  levothyroxine (SYNTHROID, LEVOTHROID) 100 MCG tablet TAKE 1 TABLET BY MOUTH ONCE DAILY Patient taking differently: Take 100 mcg by mouth daily before breakfast.  12/26/17  Yes Baxley, Cresenciano Lick, MD  promethazine (PHENERGAN) 25 MG tablet TAKE 1 TABLET BY MOUTH EVERY 8 HOURS AS NEEDED FOR NAUSEA AND VOMITING Patient taking differently: Take 25 mg by mouth every 8 (eight) hours as needed for nausea or vomiting.  12/28/16  Yes Baxley, Cresenciano Lick, MD  estradiol (ESTRACE VAGINAL) 0.1 MG/GM vaginal cream Place 1 Applicatorful vaginally 2 (two) times a week. 01/13/18   Megan Salon, MD  Estradiol 1 MG/GM GEL Place onto the skin.    [provider]  linaclotide (LINZESS) 290 MCG CAPS capsule Take 290 mcg by mouth daily as needed (for constipation).     [provider]  methocarbamol (ROBAXIN) 500 MG tablet Take 1 tablet (500 mg total) by mouth every 6 (six) hours as needed for muscle spasms. Patient not taking: Reported on 02/11/2018 01/21/18   Edmisten, Drue Dun L, PA  oxyCODONE (OXY IR/ROXICODONE) 5 MG immediate release tablet Take 1-2 tablets (5-10 mg total) by mouth every 6 (six) hours as needed for moderate pain (pain score 4-6). Patient not taking: Reported on 02/11/2018 01/21/18   Edmisten, Drue Dun L, PA  pantoprazole (PROTONIX) 40 MG tablet TAKE 1 TABLET BY MOUTH TWICE DAILY--- PRN Patient not taking: Reported on 02/11/2018 2/83/15   [name], MD  polyethylene glycol [facility] / Floria Raveling) packet Take 17 g by mouth daily as needed for mild constipation.    [provider]  rivaroxaban (XARELTO) 10 MG TABS tablet Take 1 tablet (10 mg total) by mouth daily with breakfast for 20 days. Take one 10 mg xarelto once a day for three weeks  following surgery to prevent blood clots. Then take one 81 mg aspirin  once a day for three weeks. Then discontinue aspirin. Patient not taking: Reported on 02/11/2018 01/21/18 02/11/18  Derl Barrow, PA  spironolactone (ALDACTONE) 25 MG tablet Take 1 tablet (25 mg total) by mouth 2 (two) times daily. Increase to 50mg  BID as directed. Patient taking differently: Take 25 mg by mouth See admin instructions. Take 25 mg by mouth two times a day and Increase to 50 mg two times a day as directed 01/13/18   Megan Salon, MD  valACYclovir (VALTREX) 500 MG tablet TAKE 1 TABLET BY MOUTH ONCE DAILY PRN Patient not taking: Reported on 02/11/2018 9/56/38   [name], MD    Family History Family History  Problem Relation Age of Onset  . Diabetes Father   . Diabetes Sister   . Hypertension Sister   . Hypertension Mother     Social History Social History   Tobacco Use  . Smoking status: Never Smoker  . Smokeless tobacco: Never Used  Substance Use Topics  . Alcohol use: No  . Drug use: No     Allergies   Other and Estradiol   Review of Systems Review of Systems  Gastrointestinal: Positive for abdominal pain, diarrhea, nausea and vomiting.  All other systems reviewed and are negative.    Physical Exam Updated Vital Signs BP 122/84 (BP Location: Right Arm)   Pulse 84   Temp 97.8 F (36.6 C) (Oral)   Resp 15   Ht 5\' 7"  (1.702 m)   Wt 103.4 kg   SpO2 100%   BMI 35.71 kg/m   Physical Exam Vitals signs and nursing note reviewed.  Constitutional:      General: She is not in acute distress.    Appearance: She is well-developed.     Comments: Non-toxic appearing.   HENT:     Head: Normocephalic and atraumatic.  Cardiovascular:     Rate and Rhythm: Normal rate and regular rhythm.     Heart sounds: Normal heart sounds. No murmur.  Pulmonary:     Effort: Pulmonary effort is normal. No respiratory distress.     Breath sounds: Normal breath sounds.  Abdominal:      General: There is no distension.     Palpations: Abdomen is soft.     Comments: No CVA, flank or abdominal tenderness.  Skin:    General: Skin is warm and dry.  Neurological:     Mental Status: She is alert and oriented to person, place, and time.      ED Treatments / Results  Labs (all labs ordered are listed, but only abnormal results are displayed) Labs Reviewed  COMPREHENSIVE METABOLIC PANEL - Abnormal; Notable for the following components:      Result Value   Potassium 6.3 (*)    AST 51 (*)    Total Bilirubin 1.4 (*)    All other components within normal limits  CBC - Abnormal; Notable for the following components:   RBC 3.81 (*)    Hemoglobin 11.2 (*)    MCHC 29.6 (*)    RDW 17.2 (*)    All other components within normal limits  C DIFFICILE QUICK SCREEN W PCR REFLEX  GASTROINTESTINAL PANEL BY PCR, STOOL (REPLACES STOOL CULTURE)  LIPASE, BLOOD  POTASSIUM    EKG None  Radiology No results found.  Procedures Procedures (including critical care time)  Medications Ordered in ED Medications  sodium chloride 0.9 % bolus 1,000 mL (0 mLs Intravenous Stopped 02/11/18 1811)  sodium chloride 0.9 % bolus 500  mL (500 mLs Intravenous New Bag/Given 02/11/18 1811)     Initial Impression / Assessment and Plan / ED Course  I have reviewed the triage vital signs and the nursing notes.  Pertinent labs & imaging results that were available during my care of the patient were reviewed by me and considered in my medical decision making (see chart for details).    Sultana Rand Boller is a 57 y.o. female who presents to ED for persistent diarrhea for several days. Per patient, seen by her GI doc today (Dr. [name]) where her heart rate was little elevated and she was referred to ER for labs and fluids for concerns of dehydration.  On my examination, she is afebrile and hemodynamically stable with normal heart rate in the 70s to 80s.  No abdominal tenderness.  Labs reviewed and  reassuring.  Initial potassium was elevated at 6.3 which is likely due to hemolysis as repeat was within defined limits at 4.8.  Hydrated in the emergency department.  Repeat abdominal exam benign.  No emesis.  Tolerating p.o.  Will have her follow-up with GI.  Reasons to return to ER discussed and all questions answered.   Final Clinical Impressions(s) / ED Diagnoses   Final diagnoses:  Diarrhea, unspecified type    ED Discharge Orders    None       Ward, Ozella Almond, PA-C 02/11/18 1926    Carmin Muskrat, MD 02/14/18 0004

## 2018-02-11 NOTE — Discharge Instructions (Signed)
It was my pleasure taking care of you today!   Try your best to stay hydrated.  Small sips of water throughout the day.  Call Dr. [name] in the morning to arrange for a follow-up appointment.  Return to ER for new or worsening symptoms, any additional concerns.

## 2018-02-11 NOTE — ED Notes (Signed)
Pt unable to have BM.  [place] home per MD.

## 2018-02-20 MED FILL — LIDOCAINE PATCH 5%: 5 | 30 days supply | Qty: 30 | Fill #0

## 2018-02-27 ENCOUNTER — Other Ambulatory Visit: Payer: Self-pay | Admitting: Internal Medicine

## 2018-02-27 MED FILL — CYANOCOBALAMIN 1,000 MCG/ML: 1000 | 84 days supply | Qty: 3 | Fill #0

## 2018-02-27 MED FILL — ALPRAZolam 0.5 MG TABS: 0.5 | 90 days supply | Qty: 90 | Fill #1

## 2018-02-27 MED FILL — HUMIRA PEN 40 MG/0.4ML PNKT: 40 | 28 days supply | Qty: 4 | Fill #2

## 2018-02-27 MED FILL — CYCLOBENZAPRINE HCL 10 MG T: 10 | 90 days supply | Qty: 90 | Fill #2

## 2018-03-11 ENCOUNTER — Telehealth: Payer: Self-pay | Admitting: Internal Medicine

## 2018-03-11 NOTE — Telephone Encounter (Signed)
Patient calling with her [place] Referral #[id number] to Dr. Hassell Done @ [facility].  Appt. 03/14/18 effective for 1 year.  She is seeing him to follow up on her Gastric Bypass Surgery.

## 2018-03-17 ENCOUNTER — Other Ambulatory Visit: Payer: No Typology Code available for payment source | Admitting: Internal Medicine

## 2018-03-17 DIAGNOSIS — E538 Deficiency of other specified B group vitamins: Secondary | ICD-10-CM

## 2018-03-17 DIAGNOSIS — G8929 Other chronic pain: Secondary | ICD-10-CM

## 2018-03-17 DIAGNOSIS — M25562 Pain in left knee: Secondary | ICD-10-CM

## 2018-03-17 DIAGNOSIS — Z0000 Encounter for general adult medical examination without abnormal findings: Secondary | ICD-10-CM

## 2018-03-17 DIAGNOSIS — M25552 Pain in left hip: Secondary | ICD-10-CM

## 2018-03-17 DIAGNOSIS — E039 Hypothyroidism, unspecified: Secondary | ICD-10-CM

## 2018-03-18 LAB — COMPLETE METABOLIC PANEL WITH GFR
AG Ratio: 1.2 (calc) (ref 1.0–2.5)
ALT: 19 U/L (ref 6–29)
AST: 18 U/L (ref 10–35)
Albumin: 3.8 g/dL (ref 3.6–5.1)
Alkaline phosphatase (APISO): 60 U/L (ref 33–130)
BUN: 12 mg/dL (ref 7–25)
CO2: 28 mmol/L (ref 20–32)
Calcium: 9 mg/dL (ref 8.6–10.4)
Chloride: 106 mmol/L (ref 98–110)
Creat: 0.76 mg/dL (ref 0.50–1.05)
GFR, Est African American: 101 mL/min/{1.73_m2} (ref >=60)
GFR, Est Non African American: 87 mL/min/{1.73_m2} (ref >=60)
Globulin: 3.2 g/dL (calc) (ref 1.9–3.7)
Glucose, Bld: 81 mg/dL (ref 65–99)
Potassium: 4.5 mmol/L (ref 3.5–5.3)
Sodium: 142 mmol/L (ref 135–146)
Total Bilirubin: 0.3 mg/dL (ref 0.2–1.2)
Total Protein: 7 g/dL (ref 6.1–8.1)

## 2018-03-18 LAB — CBC WITH DIFFERENTIAL/PLATELET
Absolute Monocytes: 221 cells/uL (ref 200–950)
Basophils Absolute: 20 cells/uL (ref 0–200)
Basophils Relative: 0.6 %
Eosinophils Absolute: 190 cells/uL (ref 15–500)
Eosinophils Relative: 5.6 %
HCT: 33.5 % — ABNORMAL LOW (ref 35.0–45.0)
Hemoglobin: 10.8 g/dL — ABNORMAL LOW (ref 11.7–15.5)
Lymphs Abs: 1448 cells/uL (ref 850–3900)
MCH: 30 pg (ref 27.0–33.0)
MCHC: 32.2 g/dL (ref 32.0–36.0)
MCV: 93.1 fL (ref 80.0–100.0)
MPV: 11.5 fL (ref 7.5–12.5)
Monocytes Relative: 6.5 %
Neutro Abs: 1520 cells/uL (ref 1500–7800)
Neutrophils Relative %: 44.7 %
Platelets: 279 10*3/uL (ref 140–400)
RBC: 3.6 10*6/uL — ABNORMAL LOW (ref 3.80–5.10)
RDW: 13.8 % (ref 11.0–15.0)
Total Lymphocyte: 42.6 %
WBC: 3.4 10*3/uL — ABNORMAL LOW (ref 3.8–10.8)

## 2018-03-18 LAB — TSH: TSH: 2.78 mIU/L (ref 0.40–4.50)

## 2018-03-18 LAB — VITAMIN D 25 HYDROXY (VIT D DEFICIENCY, FRACTURES): Vit D, 25-Hydroxy: 27 ng/mL — ABNORMAL LOW (ref 30–100)

## 2018-03-18 LAB — LIPID PANEL
Cholesterol: 204 mg/dL — ABNORMAL HIGH (ref <200)
HDL: 79 mg/dL (ref >50)
LDL Cholesterol (Calc): 110 mg/dL (calc) — ABNORMAL HIGH
Non-HDL Cholesterol (Calc): 125 mg/dL (calc) (ref <130)
Total CHOL/HDL Ratio: 2.6 (calc) (ref <5.0)
Triglycerides: 65 mg/dL (ref <150)

## 2018-03-21 ENCOUNTER — Ambulatory Visit (INDEPENDENT_AMBULATORY_CARE_PROVIDER_SITE_OTHER): Payer: No Typology Code available for payment source | Admitting: Internal Medicine

## 2018-03-21 ENCOUNTER — Encounter: Payer: Self-pay | Admitting: Internal Medicine

## 2018-03-21 VITALS — BP 120/70 | HR 96 | Temp 98.6°F | Ht 67.0 in

## 2018-03-21 DIAGNOSIS — E039 Hypothyroidism, unspecified: Secondary | ICD-10-CM | POA: Diagnosis not present

## 2018-03-21 DIAGNOSIS — Z96642 Presence of left artificial hip joint: Secondary | ICD-10-CM

## 2018-03-21 DIAGNOSIS — Z96641 Presence of right artificial hip joint: Secondary | ICD-10-CM

## 2018-03-21 DIAGNOSIS — R8290 Unspecified abnormal findings in urine: Secondary | ICD-10-CM

## 2018-03-21 DIAGNOSIS — K219 Gastro-esophageal reflux disease without esophagitis: Secondary | ICD-10-CM | POA: Diagnosis not present

## 2018-03-21 DIAGNOSIS — E538 Deficiency of other specified B group vitamins: Secondary | ICD-10-CM

## 2018-03-21 DIAGNOSIS — E559 Vitamin D deficiency, unspecified: Secondary | ICD-10-CM

## 2018-03-21 DIAGNOSIS — Z9884 Bariatric surgery status: Secondary | ICD-10-CM

## 2018-03-21 DIAGNOSIS — D649 Anemia, unspecified: Secondary | ICD-10-CM

## 2018-03-21 DIAGNOSIS — Z96652 Presence of left artificial knee joint: Secondary | ICD-10-CM

## 2018-03-21 DIAGNOSIS — Z0000 Encounter for general adult medical examination without abnormal findings: Secondary | ICD-10-CM

## 2018-03-21 DIAGNOSIS — D708 Other neutropenia: Secondary | ICD-10-CM

## 2018-03-21 LAB — POCT URINALYSIS DIPSTICK
Glucose, UA: NEGATIVE
Ketones, UA: NEGATIVE
Nitrite, UA: NEGATIVE
Protein, UA: NEGATIVE
Spec Grav, UA: 1.015 (ref 1.010–1.025)
Urobilinogen, UA: 0.2 E.U./dL
pH, UA: 5 (ref 5.0–8.0)

## 2018-03-21 MED ORDER — TERCONAZOLE 0.4 % VA CREA: 1.0000 | TOPICAL_CREAM | Freq: Every day | VAGINAL | 0 refills | Status: DC

## 2018-03-21 MED ORDER — HYDROCODONE-ACETAMINOPHEN 10-325 MG PO TABS: 1.0000 | ORAL_TABLET | Freq: Four times a day (QID) | ORAL | 0 refills | Status: DC | PRN

## 2018-03-21 MED FILL — TERCONAZOLE 0.4% VAG CREAM: 0.4 | 5 days supply | Qty: 45 | Fill #0

## 2018-03-21 MED FILL — LEVOTHYROXINE 100 MCG TAB: 100 | 90 days supply | Qty: 90 | Fill #1

## 2018-03-21 MED FILL — HYDROCODON-APAP 10-325: 10-325 | 5 days supply | Qty: 20 | Fill #0

## 2018-03-23 LAB — URINE CULTURE
MICRO NUMBER:: 101208
SPECIMEN QUALITY:: ADEQUATE

## 2018-03-24 ENCOUNTER — Ambulatory Visit
Admission: RE | Admit: 2018-03-24 | Discharge: 2018-03-24 | Disposition: A | Discharge status: Home / Self Care | Payer: No Typology Code available for payment source | Source: Ambulatory Visit | Attending: Internal Medicine | Admitting: Internal Medicine

## 2018-03-24 ENCOUNTER — Ambulatory Visit
Admission: RE | Admit: 2018-03-24 | Discharge: 2018-03-24 | Disposition: A | Discharge status: Home / Self Care | Payer: No Typology Code available for payment source | Source: Ambulatory Visit | Attending: Obstetrics & Gynecology | Admitting: Obstetrics & Gynecology

## 2018-03-24 DIAGNOSIS — Z139 Encounter for screening, unspecified: Secondary | ICD-10-CM

## 2018-03-24 DIAGNOSIS — E2839 Other primary ovarian failure: Secondary | ICD-10-CM

## 2018-03-24 MED FILL — FLUOROMETHOLONE 0.1% DROPS: 0.1 | 50 days supply | Qty: 10 | Fill #0

## 2018-03-24 MED FILL — HUMIRA PEN 40 MG/0.4ML PNKT: 40 | 28 days supply | Qty: 4 | Fill #3

## 2018-03-25 ENCOUNTER — Other Ambulatory Visit: Payer: No Typology Code available for payment source | Admitting: Internal Medicine

## 2018-03-25 DIAGNOSIS — Z9884 Bariatric surgery status: Secondary | ICD-10-CM

## 2018-03-28 LAB — IRON,TIBC AND FERRITIN PANEL
%SAT: 17 % (calc) (ref 16–45)
Ferritin: 21 ng/mL (ref 16–232)
Iron: 67 ug/dL (ref 45–160)
TIBC: 405 mcg/dL (calc) (ref 250–450)

## 2018-03-28 LAB — VITAMIN B12: Vitamin B-12: 496 pg/mL (ref 200–1100)

## 2018-03-28 LAB — VITAMIN B1: Vitamin B1 (Thiamine): 11 nmol/L (ref 8–30)

## 2018-03-28 LAB — FOLATE: Folate: 15.3 ng/mL

## 2018-04-17 ENCOUNTER — Other Ambulatory Visit: Payer: Self-pay | Admitting: Pharmacist

## 2018-04-17 MED ORDER — ADALIMUMAB 40 MG/0.4ML ~~LOC~~ AJKT: 1.0000 "pen " | AUTO-INJECTOR | SUBCUTANEOUS | 6 refills | Status: DC

## 2018-04-17 MED ORDER — ADALIMUMAB 40 MG/0.4ML ~~LOC~~ AJKT: 2.0000 "pen " | AUTO-INJECTOR | SUBCUTANEOUS | 6 refills | Status: DC

## 2018-04-17 MED FILL — HUMIRA PEN 40 MG/0.4ML PNKT: 40 | 28 days supply | Qty: 4 | Fill #0

## 2018-04-17 MED FILL — TRIAMCINOLONE 0.1% CREAM: 0.1 | 15 days supply | Qty: 45 | Fill #0

## 2018-04-19 NOTE — Patient Instructions (Signed)
Please restart B12 injections and take thiamine supplement supplement as well as vitamin D supplement.

## 2018-04-19 NOTE — Progress Notes (Signed)
Subjective:    Patient ID: Victoria [name], female    DOB: December 23, 1960, 58 y.o.   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  Dr. Sabra Heck is GYN physician.  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  In May 2019 she had an acute medial meniscal tear and underwent left knee arthroscopic surgery with meniscal debridement in May 2019.    The knee never fully recovered from that.  She continued to experience pain.  In November, she underwent total left knee arthroplasty.  She is slowly recovering from that surgery.  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  In December 2019 she presented to the Emergency department with diarrhea and volume depletion.  Was treated with IV fluids and improved.  In March 2019 she had MRI of the brain for headaches.  No nausea and vomiting with the headaches but the headaches could last up to 3 days per episode.  Had some vertigo with a headaches.  She was found to have a partial empty sella and prominent CSF surrounding the optic nerves.  Headaches resolved with conservative treatment.  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  No longer has this issue.  She saw gastric bypass position at [facility] to help her manage this issue.  She had gastric bypass surgery Roux-en-Y laparoscopic by Dr. Hassell Done in 2016.  She weighed 296 pounds before surgery.  She had a carbuncle in her perianal area was excised by Dr. Marcello Moores in August 2018.  Pathology showed this to be a ruptured and inflamed epidermal inclusion cyst.  Has eye exam by Dr. Herbert Deaner.  Had colonoscopy by Dr. [name] in February 2019.  10-year follow-up recommended.  History of B12 deficiency and gives herself monthly B12 injections.  Remote history of hidradenitis suppurativa.  She had a thyroidectomy 1980  and is on thyroid replacement therapy.  Cholecystectomy 1987.  Remote history of adenomatous colon polyp.  C-section for twins in 45.  Bilateral tubal ligation in the 1980s.  Total abdominal hysterectomy 2004.  Had left hip replacement December 2010.  Right hip replacement March 2014.  Endoscopy for epigastric pain 2012 by Dr. Olevia Perches.  History of H. pylori infection 2001.  History of C. difficile infection treated by Dr. Olevia Perches in 2012.  Upper endoscopy in 2012 showed chronic duodenitis treated with PPI.  History of GE reflux.  History of recurrent vaginitis issues seen by GYN physician.  She saw Dr. Radford Pax in 2015 for chest pain.  2D echocardiogram was performed along with myocardial perfusion imaging which was normal.  In March 2018 she had an exercise tolerance test which was normal.  Family history: Mother living with history of diabetes and hypertension.  Father living with history of diabetes.  2 brothers and 1 sister.  Sister has hypothyroidism.  Social history: First marriage ended in divorce but she has remarried.  Husband has back issues and is disabled.  Patient does not smoke or consume alcohol.  2 adult twin daughters.  One daughter moved to [place] with her family.  Another daughter resides here and is a Education officer, museum in [place].  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    Bone density study ordered late January by GYN physician shows normal scores.  Mammogram late January normal.  Review of Systems  Constitutional: Positive for fatigue.  Respiratory: Negative.   Cardiovascular: Negative.   Musculoskeletal: Positive for arthralgias.  Neurological: Negative.   Psychiatric/Behavioral: Negative.        Objective:   Physical Exam Vitals signs reviewed.  Constitutional:      Appearance: Normal appearance. She is not diaphoretic.  HENT:     Head: Normocephalic and atraumatic.     Right Ear:  Tympanic membrane normal.     Left Ear: Tympanic membrane normal.     Nose: Nose normal.     Mouth/Throat:     Mouth: Mucous membranes are moist.     Pharynx: Oropharynx is clear.  Eyes:     General: No scleral icterus.    Extraocular Movements: Extraocular movements intact.     Pupils: Pupils are equal, round, and reactive to light.  Neck:     Musculoskeletal: Neck supple. No neck rigidity.     Vascular: No carotid bruit.     Comments: No masses in neck Cardiovascular:     Rate and Rhythm: Normal rate and regular rhythm.     Heart sounds: Normal heart sounds. No murmur.     Comments: Breast without masses Pulmonary:     Effort: Pulmonary effort is normal. No respiratory distress.     Breath sounds: Normal breath sounds. No wheezing or rales.  Abdominal:     Palpations: Abdomen is soft. There is no mass.     Tenderness: There is no abdominal tenderness. There is no guarding or rebound.  Genitourinary:    Comments: Deferred to GYN and is status post hysterectomy 2004 Musculoskeletal:     Right lower leg: No edema.     Left lower leg: No edema.  Lymphadenopathy:     Cervical: No cervical adenopathy.  Skin:    General: Skin is warm and dry.  Neurological:     General: No focal deficit present.     Mental Status: She is alert and oriented to person, place, and time.  Psychiatric:        Mood and Affect: Mood normal.        Behavior: Behavior normal.        Thought Content: Thought content normal.        Judgment: Judgment normal.           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  Colonoscopy is up-to-date with 10-year follow-up recommended at last  colonoscopy.  History of recurrent vaginitis treated by GYN physician  History of low white blood cell count which has been evaluated by Dr. Alvy Bimler and thought to be benign finding.  History of recurrent epidermal inclusion cyst in perianal area seen by Dr. [name].  Plan: Thiamine level checked and is low normal at 11.  Recommend thiamine supplement over-the-counter.  She has stopped B12 injections but suggested that she restart them.  B12 level is 496 and falls within normal limits but this may help her energy level if she restarts them.  Folate is normal.  Iron level is normal.  She has mild hyperlipidemia with total cholesterol 204 and LDL cholesterol 110 likely due to inactivity status post surgery.  She will work on her diet a bit  and will be exercising and more active in the upcoming weeks.  Vitamin D level is low at 27.  Recommend vitamin D supplementation.  White blood cell count 3400.  Continue to monitor.  In January 2019 white blood cell count was 2900.  Her hemoglobin is low at 10.8.  Suggest we recheck CBC in 3 months.  Her MCV is normal.  She has several issues for anemia including history of B12 deficiency, status post gastric bypass surgery, and recent orthopedic surgery.  Would like to follow-up on anemia in 3 to 6 months in addition to hypothyroidism and B12 level.  She also needs follow-up on vitamin D level within the next year.  Dipstick UA was abnormal but culture had multiple organisms.  Likely had difficulty getting clean-catch with orthopedic issues.

## 2018-04-22 ENCOUNTER — Ambulatory Visit: Payer: No Typology Code available for payment source | Admitting: Obstetrics & Gynecology

## 2018-04-22 LAB — HM DIABETES EYE EXAM

## 2018-04-24 ENCOUNTER — Encounter: Payer: Self-pay | Admitting: Internal Medicine

## 2018-05-13 ENCOUNTER — Encounter: Payer: Self-pay | Admitting: Internal Medicine

## 2018-05-21 MED FILL — RESTASIS 0.05% EYE EMULSION: 0.05 | 90 days supply | Qty: 180 | Fill #0

## 2018-05-21 MED FILL — FLUOROMETHOLONE 0.1% DROPS: 0.1 | 30 days supply | Qty: 10 | Fill #0

## 2018-06-04 MED FILL — HUMIRA PEN 40 MG/0.4ML PNKT: 40 | 28 days supply | Qty: 4 | Fill #1

## 2018-06-16 ENCOUNTER — Other Ambulatory Visit: Payer: Self-pay | Admitting: Internal Medicine

## 2018-06-17 MED FILL — CYCLOBENZAPRINE HCL 10 MG T: 10 | 90 days supply | Qty: 90 | Fill #0

## 2018-06-17 MED FILL — LEVOTHYROXINE 100 MCG TAB: 100 | 90 days supply | Qty: 90 | Fill #0

## 2018-06-18 ENCOUNTER — Other Ambulatory Visit: Payer: Self-pay | Admitting: Internal Medicine

## 2018-06-18 MED FILL — CYANOCOBALAMIN 1,000 MCG/ML: 1000 | 84 days supply | Qty: 3 | Fill #1

## 2018-06-18 NOTE — Telephone Encounter (Signed)
Need virtual visit cannot just refill vis controlled guidelines

## 2018-06-26 NOTE — Telephone Encounter (Signed)
LVM to CB.

## 2018-06-27 ENCOUNTER — Ambulatory Visit (INDEPENDENT_AMBULATORY_CARE_PROVIDER_SITE_OTHER): Payer: No Typology Code available for payment source | Admitting: Internal Medicine

## 2018-06-27 DIAGNOSIS — Z96652 Presence of left artificial knee joint: Secondary | ICD-10-CM | POA: Diagnosis not present

## 2018-06-27 DIAGNOSIS — G8929 Other chronic pain: Secondary | ICD-10-CM

## 2018-06-27 DIAGNOSIS — M25562 Pain in left knee: Secondary | ICD-10-CM

## 2018-06-27 MED ORDER — GABAPENTIN 600 MG PO TABS: 600.0000 mg | ORAL_TABLET | Freq: Three times a day (TID) | ORAL | 2 refills | Status: DC

## 2018-06-27 MED ORDER — HYDROCODONE-ACETAMINOPHEN 10-325 MG PO TABS: 1.0000 | ORAL_TABLET | ORAL | 0 refills | Status: DC | PRN

## 2018-06-27 MED FILL — GABAPENTIN 600 MG TABLET: 600 | 30 days supply | Qty: 90 | Fill #0

## 2018-06-27 MED FILL — HYDROCODON-APAP 10-325: 10-325 | 5 days supply | Qty: 30 | Fill #0

## 2018-06-30 NOTE — Telephone Encounter (Signed)
Virtual visit scheduled and completed

## 2018-07-16 ENCOUNTER — Other Ambulatory Visit: Payer: Self-pay

## 2018-07-18 ENCOUNTER — Ambulatory Visit (INDEPENDENT_AMBULATORY_CARE_PROVIDER_SITE_OTHER): Payer: No Typology Code available for payment source | Admitting: Obstetrics & Gynecology

## 2018-07-18 ENCOUNTER — Other Ambulatory Visit: Payer: Self-pay

## 2018-07-18 ENCOUNTER — Encounter: Payer: Self-pay | Admitting: Obstetrics & Gynecology

## 2018-07-18 VITALS — BP 114/76 | HR 70 | Temp 97.3°F | Resp 16 | Wt 245.0 lb

## 2018-07-18 DIAGNOSIS — N898 Other specified noninflammatory disorders of vagina: Secondary | ICD-10-CM

## 2018-07-18 DIAGNOSIS — N952 Postmenopausal atrophic vaginitis: Secondary | ICD-10-CM

## 2018-07-18 MED ORDER — ALPRAZOLAM 1 MG PO TABS: 1.0000 mg | ORAL_TABLET | Freq: Every day | ORAL | 2 refills | Status: DC

## 2018-07-18 MED ORDER — ESTRADIOL 10 MCG VA TABS: ORAL_TABLET | VAGINAL | 0 refills | Status: DC

## 2018-07-18 MED FILL — ESTRADIOL 10 MCG TABS: 10 | 28 days supply | Qty: 18 | Fill #0

## 2018-07-18 NOTE — Telephone Encounter (Signed)
Call in Xanax 1 mg #30 one po qhs with 2 refills

## 2018-07-18 NOTE — Progress Notes (Signed)
GYNECOLOGY  VISIT  CC:   Follow up  HPI: 58 y.o. G2P0112 Married Victoria [name] American female here for f/u of estrace cream.  Reports she tried the estrogen cream for several weeks but had itching.  She stopped this in late February.  Had knee replacement in November and still doesn't think she doesn't have great ROM in knee.  She didn't try the estrogen until the knee was partially healed.    Father passed about a week ago due to chronic leg infection from diabetes.  She is very teary today.  Husband is a diabetic and had to have a toe amputated.    GYNECOLOGIC HISTORY: No LMP recorded. Patient has had a hysterectomy. Contraception: hysterectomy  Menopausal hormone therapy: none  Patient Active Problem List   Diagnosis Date Noted  . OA (osteoarthritis) of knee 01/20/2018  . Acute meniscal tear, medial 07/17/2017  . Failed total hip arthroplasty ([place]) 05/22/2017  . 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 Insomnia 11/08/2016  . Pernicious anemia 11/08/2016  . Leukopenia 07/09/2016  . Hypocupremia 07/09/2016  . Palpitations 04/12/2016  . 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 Lap gastric bypass May 2016 07/06/2014  . History of Clostridium difficile infection 08/12/2011  . Postoperative hypothyroidism 08/26/2010  . Gastroesophageal reflux disease 08/26/2010  . Vitamin D deficiency 08/26/2010  . Fibrocystic disease of breast 08/26/2010  . Cobalamin deficiency 08/26/2010  . Obesity 03/20/2010  . Backache 03/20/2010    Past Medical History:  Diagnosis Date  . Chronic leukopenia followed by pcp (previously followed by hematology-- dr Alvy Bimler)   intermittant since 2010  . Degenerative joint disease of low back 09/02/15    Dr. Maureen Ralphs  . Fibrocystic breast   . GERD (gastroesophageal reflux disease)   . Headache    per pt more stress/tension  . Heart palpitations    SEE EPIC ENCOUTNER , CARDIOLOGY DR. Tressia Miners TURNER 2018;  reports on 05-16-17 " i haven't had any bouts of those lately"    . Hemorrhoids   . History of Clostridium difficile infection 12/2010  . History of exercise intolerance    ETT on 04-27-2016-- negative ([facility] treadmill score 7)  . History of Helicobacter pylori infection 2001 and 09/ 2012  . HSV-2 infection    genital  . Hx of adenomatous colonic polyps 10/17/2007  . Hydradenitis    per pt currently treated with Humira injection  . Hypothyroidism, postsurgical 1980  . Osteoarthritis   . Pernicious anemia    b12 def  . PONV (postoperative nausea and vomiting)   . 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 Varicose veins   . Vitamin B 12 deficiency   . Vitamin D deficiency     Past Surgical History:  Procedure Laterality Date  . BREATH TEK H PYLORI N/A 05/03/2014   Procedure: BREATH TEK H PYLORI;  Surgeon: Kaylyn Lim, MD;  Location: [facility];  Service: General;  Laterality: N/A;  . CARDIOVASCULAR STRESS TEST  03/11/2013   dr Tressia Miners turner   Low risk nuclear study w/ a very small anterior perfusion defect that most likely represents breast attenuation artifact, less likely this could represent a true perfusion abnormality in the distribution of diagonal branch of the LAD/  normal LV function and wall motion , ef 66%  . CATARACT EXTRACTION W/ INTRAOCULAR LENS  IMPLANT, BILATERAL  10/2017  . CESAREAN SECTION  1985  .  CHOLECYSTECTOMY OPEN  1987  . COLONOSCOPY  02/2017   Dr [name] ; per patient they found 7 polyps with polypectomy; on 5 year track   . FINE NEEDLE ASPIRATION Left 05/22/2017   Procedure: LEFT KNEE ASPIRATION;  Surgeon: Gaynelle Arabian, MD;  Location: [facility];  Service: Orthopedics;  Laterality: Left;  Marland Kitchen GASTRIC ROUX-EN-Y N/A 07/06/2014   Procedure: LAPAROSCOPIC ROUX-EN-Y GASTRIC BYPASS, ENTEROLYSIS OF ADHESIONS WITH UPPER ENDOSCOPY;  Surgeon: Johnathan Hausen, MD;  Location: [facility];  Service: General;   Laterality: N/A;  . HAMMER TOE SURGERY Bilateral 02/17/2008   bilateral foot digit's 2 and 5  . HIATAL HERNIA REPAIR N/A 07/06/2014   Procedure: LAPAROSCOPIC REPAIR OF HIATAL HERNIA;  Surgeon: Johnathan Hausen, MD;  Location: [facility];  Service: General;  Laterality: N/A;  . KNEE ARTHROSCOPY WITH MEDIAL MENISECTOMY Left 07/17/2017   Procedure: LEFT KNEE ARTHROSCOPY WITH MEDIAL LATERAL MENISECTOMY;  Surgeon: Gaynelle Arabian, MD;  Location: [facility];  Service: Orthopedics;  Laterality: Left;  Marland Kitchen MASS EXCISION N/A 10/11/2016   Procedure: EXCISION OF PERIANAL MASS;  Surgeon: [name], MD;  Location: [place];  Service: General;  Laterality: N/A;  . REFRACTIVE SURGERY    . THYROIDECTOMY  1980  . TOTAL ABDOMINAL HYSTERECTOMY  08-25-2002   dr Margaretha Glassing   ovaries retained  . TOTAL HIP ARTHROPLASTY Right 05/21/2012   Procedure: TOTAL HIP ARTHROPLASTY;  Surgeon: Gearlean Alf, MD;  Location: [facility];  Service: Orthopedics;  Laterality: Right;  . TOTAL HIP ARTHROPLASTY Left 01-31-2009  dr Wynelle Link  . TOTAL HIP REVISION Left 05/22/2017   Procedure: Left hip bearing surface and head revision;  Surgeon: Gaynelle Arabian, MD;  Location: [facility];  Service: Orthopedics;  Laterality: Left;  . TOTAL KNEE ARTHROPLASTY Left 01/20/2018   Procedure: LEFT TOTAL KNEE ARTHROPLASTY;  Surgeon: Gaynelle Arabian, MD;  Location: [facility];  Service: Orthopedics;  Laterality: Left;  64min  . TRANSTHORACIC ECHOCARDIOGRAM  05/03/2016   ef 55-60%/  trivial MR/  mild TR    MEDS:   Current Outpatient Medications on File Prior to Visit  Medication Sig Dispense Refill  . acetaminophen (TYLENOL) 500 MG tablet Take 1,000 mg by mouth every 6 (six) hours as needed (for pain).    . Adalimumab (HUMIRA PEN) 40 MG/0.4ML PNKT Inject 1 pen into the skin once a week. (Patient not taking: Reported on 06/27/2018) 2 each 6  . ALPRAZolam (XANAX) 0.5 MG tablet TAKE 1 TABLET BY MOUTH AT BEDTIME (Patient not taking: No sig reported) 90  tablet 1  . cyanocobalamin (,VITAMIN B-12,) 1000 MCG/ML injection INJECT 1 ML INTRAMUSCULARLY EVERY MONTH 25 mL 0  . cyclobenzaprine (FLEXERIL) 10 MG tablet TAKE 1 TABLET BY MOUTH AT BEDTIME 90 tablet 3  . estradiol (ESTRACE VAGINAL) 0.1 MG/GM vaginal cream Place 1 Applicatorful vaginally 2 (two) times a week. 42.5 g 1  . Estradiol 1 MG/GM GEL Place onto the skin.    Marland Kitchen gabapentin (NEURONTIN) 600 MG tablet Take 1 tablet (600 mg total) by mouth 3 (three) times daily. 90 tablet 2  . HYDROcodone-acetaminophen (NORCO) 10-325 MG tablet Take 1 tablet by mouth every 4 (four) hours as needed. 30 tablet 0  . levothyroxine (SYNTHROID) 100 MCG tablet TAKE 1 TABLET BY MOUTH ONCE DAILY 90 tablet 1  . linaclotide (LINZESS) 290 MCG CAPS capsule Take 290 mcg by mouth daily as needed (for constipation).     . pantoprazole (PROTONIX) 40 MG tablet TAKE 1 TABLET BY MOUTH TWICE DAILY--- PRN    .  polyethylene glycol (MIRALAX / GLYCOLAX) packet Take 17 g by mouth daily as needed for mild constipation.    . promethazine (PHENERGAN) 25 MG tablet TAKE 1 TABLET BY MOUTH EVERY 8 HOURS AS NEEDED FOR NAUSEA AND VOMITING (Patient taking differently: Take 25 mg by mouth every 8 (eight) hours as needed for nausea or vomiting. ) 30 tablet 5  . valACYclovir (VALTREX) 500 MG tablet TAKE 1 TABLET BY MOUTH ONCE DAILY PRN     No current facility-administered medications on file prior to visit.     ALLERGIES: Other and Estradiol  Family History  Problem Relation Age of Onset  . Diabetes Father   . Diabetes Sister   . Hypertension Sister   . Hypertension Mother     SH:  Married, non smoker  Review of Systems  Constitutional: Negative.   HENT: Negative.   Eyes: Negative.   Respiratory: Negative.   Cardiovascular: Negative.   Gastrointestinal: Negative.   Endocrine: Negative.   Genitourinary: Negative.        Vaginal discharge  Musculoskeletal: Negative.   Skin: Negative.   Allergic/Immunologic: Negative.    Neurological: Negative.   Psychiatric/Behavioral: Negative.     PHYSICAL EXAMINATION:   Vitals:   07/18/18 1045  BP: 114/76  Pulse: 70  Resp: 16  Temp: (!) 97.3 F (36.3 C)    General appearance: alert, cooperative and appears stated age Lymph:  no inguinal LAD noted  Pelvic: External genitalia:  no lesions              Urethra:  normal appearing urethra with no masses, tenderness or lesions              Bartholins and Skenes: normal                 Vagina: normal appearing vagina with normal color and discharge, no lesions              Cervix: absent              Bimanual Exam:  Uterus:  uterus absent              Adnexa: no mass, fullness, tenderness  Chaperone was present for exam.  Assessment: Vaginal atrophy Vaginal discharge Itching with estrogen vaginal cream  Plan: Affirm pending Rx for Vagifem 80meq pv nightly x 14 nights and then twice weekly to rx.  Pt only wants one month supply to start.  #18/0RF.  She will give me an update in about a month or if does not tolerate.

## 2018-07-18 NOTE — Telephone Encounter (Signed)
Prescribe Xanax 1 mg #30 with 2 refills- one po qhs to [facility]. Pt lost her father recently and is having insomnia.Spoke with pt.

## 2018-07-19 LAB — VAGINITIS/VAGINOSIS, DNA PROBE
Candida Species: NEGATIVE
Gardnerella vaginalis: POSITIVE — AB
Trichomonas vaginosis: NEGATIVE

## 2018-07-19 MED FILL — ALPRAZolam 1 MG TABS: 1 | 30 days supply | Qty: 30 | Fill #0

## 2018-07-20 ENCOUNTER — Encounter: Payer: Self-pay | Admitting: Internal Medicine

## 2018-07-20 NOTE — Patient Instructions (Addendum)
Norco 10/325 for chronic left knee pain.  Take 1/2-1 tab every 4-6 hours as needed.  Take sparingly.

## 2018-07-20 NOTE — Progress Notes (Signed)
   Subjective:    Patient ID: Victoria [name], female    DOB: November 19, 1960, 58 y.o.   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  She is identified using 2 identifiers as Victoria [name]. Victoria [name], a longstanding patient in this practice.  She agrees to visit in this format today due to the Coronavirus pandemic.    She is status post gastric bypass surgery 2016.  Had hip arthroplasty 2010 and 2014.  History of B12 deficiency and hypothyroidism.  Given self B12 IM monthly.  Had total left knee arthroplasty by Dr. Maureen Ralphs November 2019.  Previously had had left knee arthroscopic surgery Spring 2019.  After surgery she had considerable pain and swelling that did not resolve despite cortisone injections and physical therapy.  Still has considerable knee pain and is finding it difficult to being on her feet for any length of time.  Has return to work although work has been curtailed recently due to the pandemic.  No GI procedures being done but she has worked some in the clinic.  She saw Dr. Herbert Deaner for annual eye exam August 2019.  Has been diagnosed with bilateral cataracts.  Would like to have some narcotic pain medication on hand.    Review of Systems see above     Objective:   Physical Exam Unable to examine knee due to nature of visit which is virtual.  She is a good historian and a Equities trader.       Assessment & Plan:  Chronic left knee pain status post left knee arthroplasty  Plan: Norco 10/325 #30 to take one half tab to 1 tab p.o. every 4 hours as needed pain.  Take sparingly.  No refill.

## 2018-07-22 ENCOUNTER — Telehealth: Payer: Self-pay | Admitting: *Deleted

## 2018-07-22 MED ORDER — METRONIDAZOLE 0.75 % VA GEL: 1.0000 | Freq: Every day | VAGINAL | 0 refills | Status: AC

## 2018-07-22 MED FILL — metroNIDAZOLE 0.75 % GEL: 0.75 | 5 days supply | Qty: 70 | Fill #0

## 2018-07-22 NOTE — Telephone Encounter (Signed)
Pt notified. Verbalized understanding. Rx sent to pharmacy. Patient will call to set up appt if symptoms don't resolve

## 2018-07-22 NOTE — Telephone Encounter (Signed)
Patient is returning call to [place].

## 2018-07-22 NOTE — Telephone Encounter (Signed)
LM for pt to call back.

## 2018-07-22 NOTE — Telephone Encounter (Signed)
-----   Message from Megan Salon, MD sent at 07/22/2018 12:33 PM EDT ----- Please let pt know her BV testing was positive.  Ok to treat with Metrogel 6.85%, one applicator QHS x 5 nights.  We may want to consider repeat testing in 2 weeks, if symptoms do not resolve.

## 2018-07-31 MED FILL — HUMIRA PEN 40 MG/0.4ML PNKT: 40 | 28 days supply | Qty: 4 | Fill #2

## 2018-08-12 ENCOUNTER — Encounter: Payer: Self-pay | Admitting: Obstetrics & Gynecology

## 2018-08-12 ENCOUNTER — Other Ambulatory Visit: Payer: Self-pay | Admitting: Internal Medicine

## 2018-08-13 ENCOUNTER — Telehealth: Payer: Self-pay | Admitting: Obstetrics & Gynecology

## 2018-08-13 MED FILL — VALACYCLOVIR HCL 500 MG TAB: 500 | 90 days supply | Qty: 90 | Fill #0

## 2018-08-13 NOTE — Telephone Encounter (Signed)
Patient sent the following correspondence through [place]. Routing to triage to assist patient with request.  Hello Dr. Sabra Heck   I wanted to follow up with you about the estradiol inserts,I have not seen any change in discharge when using tablets,tonight makes 13 days I have used them and was hoping this would be the answer to my discharge issue. I used the metronidazole gel as prescribed and it helped as long as I used it but as soon as I stopped the discharge was back,please advise.

## 2018-08-13 NOTE — Telephone Encounter (Signed)
Left message to call Jill, RN at [facility] [phone].   

## 2018-08-14 ENCOUNTER — Other Ambulatory Visit (HOSPITAL_COMMUNITY)
Admission: RE | Admit: 2018-08-14 | Discharge: 2018-08-14 | Disposition: A | Discharge status: Home / Self Care | Payer: No Typology Code available for payment source | Source: Ambulatory Visit | Attending: Obstetrics & Gynecology | Admitting: Obstetrics & Gynecology

## 2018-08-14 ENCOUNTER — Ambulatory Visit (INDEPENDENT_AMBULATORY_CARE_PROVIDER_SITE_OTHER): Payer: No Typology Code available for payment source | Admitting: Obstetrics & Gynecology

## 2018-08-14 ENCOUNTER — Encounter: Payer: Self-pay | Admitting: Obstetrics & Gynecology

## 2018-08-14 ENCOUNTER — Other Ambulatory Visit: Payer: Self-pay

## 2018-08-14 ENCOUNTER — Telehealth: Payer: Self-pay | Admitting: *Deleted

## 2018-08-14 VITALS — BP 100/60 | HR 68 | Temp 96.4°F | Ht 67.0 in | Wt 245.2 lb

## 2018-08-14 DIAGNOSIS — Z124 Encounter for screening for malignant neoplasm of cervix: Secondary | ICD-10-CM | POA: Insufficient documentation

## 2018-08-14 DIAGNOSIS — N898 Other specified noninflammatory disorders of vagina: Secondary | ICD-10-CM

## 2018-08-14 DIAGNOSIS — N765 Ulceration of vagina: Secondary | ICD-10-CM | POA: Diagnosis not present

## 2018-08-14 MED ORDER — FLUCONAZOLE 150 MG PO TABS: ORAL_TABLET | ORAL | 0 refills | Status: DC

## 2018-08-14 MED FILL — FLUCONAZOLE 150 MG TABS: 150 | 3 days supply | Qty: 2 | Fill #0

## 2018-08-14 NOTE — Telephone Encounter (Signed)
Patient asking if she could take oral estradiol instead of Divigel. States she had a gastric bypass and she's not sure if oral estradiol is safe to take.   Routed to Dr. Sabra Heck.

## 2018-08-14 NOTE — Telephone Encounter (Signed)
Spoke with patient. Tx for bv with metrogel, symptoms improved, returned to vagifem as instructed. Has now used a total of 14 doses of vagifem, symptoms still present. Reports heavy light yellow vaginal d/c and vaginal itching, requires use of panty liner daily. Denies odor, bleeding or pain. [id number] prescreen negative, precautions reviewed. OV scheduled for today at 10:30am with Dr. Sabra Heck. Patient verbalizes understanding and is agreeable.   Encounter closed.

## 2018-08-14 NOTE — Progress Notes (Signed)
GYNECOLOGY  VISIT  CC:  Yellow vaginal discharge, itching, soreness LLQ - noticed on and off since last night   HPI: 58 y.o. G2P0112 Married Black or [place] American female here for follow up vaginal discharge.  She had vaginitis testing showing BV.  She was treated with Metrogel.  Reports the discharge wasn't present when she used the metrogel but it started right back afterwards.  She did use vagifem nightly x 14 nights but this did not seem to do anything.  She is noticing some vaginal itching and the continued discharge.  Denies urinary changes.  Denies vaginal bleeding.    GYNECOLOGIC HISTORY: No LMP recorded. Patient has had a hysterectomy. Contraception: hysterectomy  Menopausal hormone therapy: none  Patient Active Problem List   Diagnosis Date Noted  . OA (osteoarthritis) of knee 01/20/2018  . Acute meniscal tear, medial 07/17/2017  . 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 Insomnia 11/08/2016  . Pernicious anemia 11/08/2016  . Leukopenia 07/09/2016  . Hypocupremia 07/09/2016  . Palpitations 04/12/2016  . 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 Lap gastric bypass May 2016 07/06/2014  . History of Clostridium difficile infection 08/12/2011  . Postoperative hypothyroidism 08/26/2010  . Gastroesophageal reflux disease 08/26/2010  . Vitamin D deficiency 08/26/2010  . Fibrocystic disease of breast 08/26/2010  . Cobalamin deficiency 08/26/2010  . Obesity 03/20/2010  . Backache 03/20/2010    Past Medical History:  Diagnosis Date  . Chronic leukopenia followed by pcp (previously followed by hematology-- dr Alvy Bimler)   intermittant since 2010  . Degenerative joint disease of low back 09/02/15    Dr. Maureen Ralphs  . Fibrocystic breast   . GERD (gastroesophageal reflux disease)   . Headache    per pt more stress/tension  . Heart palpitations    SEE EPIC ENCOUT[facility] , CARDIOLOGY  DR. Tressia Miners TURNER 2018; reports on 05-16-17 " i haven't had any bouts of those lately"    . Hemorrhoids   . History of Clostridium difficile infection 12/2010  . History of exercise intolerance    ETT on 04-27-2016-- negative ([facility] treadmill score 7)  . History of Helicobacter pylori infection 2001 and 09/ 2012  . HSV-2 infection    genital  . Hx of adenomatous colonic polyps 10/17/2007  . Hydradenitis    per pt currently treated with Humira injection  . Hypothyroidism, postsurgical 1980  . Osteoarthritis   . Pernicious anemia    b12 def  . PONV (postoperative nausea and vomiting)   . 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 Varicose veins   . Vitamin B 12 deficiency   . Vitamin D deficiency     Past Surgical History:  Procedure Laterality Date  . BREATH TEK H PYLORI N/A 05/03/2014   Procedure: BREATH TEK H PYLORI;  Surgeon: Kaylyn Lim, MD;  Location: [facility];  Service: General;  Laterality: N/A;  . CARDIOVASCULAR STRESS TEST  03/11/2013   dr Tressia Miners turner   Low risk nuclear study w/ a very small anterior perfusion defect that most likely represents breast attenuation artifact, less likely this could represent a true perfusion abnormality in the distribution of diagonal branch of the LAD/  normal LV function and wall motion , ef 66%  . CATARACT EXTRACTION W/ INTRAOCULAR LENS  IMPLANT, BILATERAL  10/2017  . CESAREAN SECTION  1985  . CHOLECYSTECTOMY OPEN  1987  . COLONOSCOPY  02/2017   Dr [name] ; per patient they found 7 polyps with polypectomy; on 5 year track   . FINE NEEDLE ASPIRATION Left 05/22/2017   Procedure: LEFT KNEE ASPIRATION;  Surgeon: Gaynelle Arabian, MD;  [facility]: [facility];  Service: Orthopedics;  Laterality: Left;  Marland Kitchen GASTRIC ROUX-EN-Y N/A 07/06/2014   Procedure: LAPAROSCOPIC ROUX-EN-Y GASTRIC BYPASS, ENTEROLYSIS OF ADHESIONS WITH UPPER ENDOSCOPY;  Surgeon: Johnathan Hausen, MD;  Location: [facility];  Service: General;  Laterality: N/A;  . HAMMER TOE SURGERY Bilateral 02/17/2008   bilateral foot digit's 2 and 5  . HIATAL HERNIA REPAIR N/A 07/06/2014   Procedure: LAPAROSCOPIC REPAIR OF HIATAL HERNIA;  Surgeon: Johnathan Hausen, MD;  [facility];  Service: General;  Laterality: N/A;  . KNEE ARTHROSCOPY WITH MEDIAL MENISECTOMY Left 07/17/2017   Procedure: LEFT KNEE ARTHROSCOPY WITH MEDIAL LATERAL MENISECTOMY;  Surgeon: Gaynelle Arabian, MD;  [facility];  Service: Orthopedics;  Laterality: Left;  Marland Kitchen MASS EXCISION N/A 10/11/2016   Procedure: EXCISION OF PERIANAL MASS;  Surgeon: [name], MD;  Location: [place];  Service: General;  Laterality: N/A;  . REFRACTIVE SURGERY    . THYROIDECTOMY  1980  . TOTAL ABDOMINAL HYSTERECTOMY  08-25-2002   dr Margaretha Glassing   ovaries retained  . TOTAL HIP ARTHROPLASTY Right 05/21/2012   Procedure: TOTAL HIP ARTHROPLASTY;  Surgeon: Gearlean Alf, MD;  [facility];  Service: Orthopedics;  Laterality: Right;  . TOTAL HIP ARTHROPLASTY Left 01-31-2009  dr Wynelle Link  . TOTAL HIP REVISION Left 05/22/2017   Procedure: Left hip bearing surface and head revision;  Surgeon: Gaynelle Arabian, MD;  [facility];  Service: Orthopedics;  Laterality: Left;  . TOTAL KNEE ARTHROPLASTY Left 01/20/2018   Procedure: LEFT TOTAL KNEE ARTHROPLASTY;  Surgeon: Gaynelle Arabian, MD;  [facility];  Service: Orthopedics;  Laterality: Left;  71min  . TRANSTHORACIC ECHOCARDIOGRAM  05/03/2016   ef 55-60%/  trivial MR/  mild TR    MEDS:   Current Outpatient Medications on File Prior to Visit  Medication Sig Dispense Refill  . acetaminophen (TYLENOL) 500 MG tablet Take 1,000 mg by mouth every 6 (six) hours as needed (for pain).    . Adalimumab (HUMIRA PEN) 40 MG/0.4ML PNKT Inject 1 pen into the skin once a week. 2 each 6  . ALPRAZolam (XANAX) 1 MG tablet Take 1 tablet (1 mg total) by mouth at bedtime. 30 tablet 2  . CALCIUM PO Take by mouth. Few times  a week    . cyanocobalamin (,VITAMIN B-12,) 1000 MCG/ML injection INJECT 1 ML INTRAMUSCULARLY EVERY MONTH 25 mL 0  . cyclobenzaprine (FLEXERIL) 10 MG tablet TAKE 1 TABLET BY MOUTH AT BEDTIME 90 tablet 3  . Estradiol (VAGIFEM) 10 MCG TABS vaginal tablet 1 tab vaginally nightly x 14 nights and then twice weekly. 18 tablet 0  . gabapentin (NEURONTIN) 600 MG tablet Take 1 tablet (600 mg total) by mouth 3 (three) times daily. 90 tablet 2  . HYDROcodone-acetaminophen (NORCO) 10-325 MG tablet Take 1 tablet by mouth every 4 (four) hours as needed. 30 tablet 0  . levothyroxine (SYNTHROID) 100 MCG tablet TAKE 1 TABLET BY MOUTH ONCE DAILY 90 tablet 1  . Multiple Vitamin (MULTIVITAMIN PO) Take by mouth.    . pantoprazole (PROTONIX) 40 MG tablet TAKE 1 TABLET BY MOUTH TWICE DAILY--- PRN    . polyethylene glycol (MIRALAX / GLYCOLAX) packet Take 17 g by mouth daily as needed for mild constipation.    Marland Kitchen  promethazine (PHENERGAN) 25 MG tablet TAKE 1 TABLET BY MOUTH EVERY 8 HOURS AS NEEDED FOR NAUSEA AND VOMITING (Patient taking differently: Take 25 mg by mouth every 8 (eight) hours as needed for nausea or vomiting. ) 30 tablet 5  . valACYclovir (VALTREX) 500 MG tablet TAKE 1 TABLET BY MOUTH ONCE DAILY 90 tablet 3   No current facility-administered medications on file prior to visit.     ALLERGIES: Other and Estradiol  Family History  Problem Relation Age of Onset  . Diabetes Father   . Diabetes Sister   . Hypertension Sister   . Hypertension Mother     SH:  Married, non smoker  Review of Systems  All other systems reviewed and are negative.   PHYSICAL EXAMINATION:    BP 100/60   Pulse 68   Temp (!) 96.4 F (35.8 C) (Temporal)   Ht 5\' 7"  (1.702 m)   Wt 245 lb 3.2 oz (111.2 kg)   BMI 38.40 kg/m     General appearance: alert, cooperative and appears stated age Abdomen: soft, non-tender; bowel sounds normal; no masses,  no organomegaly Lymph:  no inguinal LAD noted  Pelvic: External  genitalia:  no lesions              Urethra:  normal appearing urethra with no masses, tenderness or lesions              Bartholins and Skenes: normal                 Vagina: atophic appearance with tiny ulcerative lesions esp on anterior vagina that is consistent with atrophy, no other lesions (HSV swab was obtained of these), yellowish clumpy discharge present c/w yeast vaginitis              Cervix: absent              Bimanual Exam:  Uterus:  uterus absent              Adnexa: no mass, fullness, tenderness  Chaperone was present for exam.  Assessment: Continued, persistent vaginal discharge Vaginal atrophic changes with tiny ulcerative lesions present esp on anterior vaginal mucosa  Plan: HSV PCR obtained today Affirm pending Diflucan 150mg  po x 1, repeat 72 hours Pap obtained today as well

## 2018-08-15 LAB — CYTOLOGY - PAP: Diagnosis: NEGATIVE

## 2018-08-15 LAB — VAGINITIS/VAGINOSIS, DNA PROBE
Candida Species: NEGATIVE
Gardnerella vaginalis: NEGATIVE
Trichomonas vaginosis: NEGATIVE

## 2018-08-15 NOTE — Telephone Encounter (Signed)
She did take oral estradiol in the past and it did not work for her possibly due to change in absorption.  I would not recommend this.  Please let her know and see if there is any specific reason she would like to switch.  Thanks.

## 2018-08-15 NOTE — Telephone Encounter (Signed)
Left voice mail to call back 

## 2018-08-15 NOTE — Telephone Encounter (Signed)
Patient states she doesn't have a specific reason just thought the oral estradiol would be easier to take but states the Divigel works fine and she will continue using it.   Dr. Lestine Box Encounter closed.

## 2018-08-15 NOTE — Telephone Encounter (Signed)
Patient returned call

## 2018-08-16 LAB — HSV DNA BY PCR (REFERENCE LAB)
HSV 2 DNA: NEGATIVE
HSV-1 DNA: NEGATIVE

## 2018-08-26 ENCOUNTER — Other Ambulatory Visit: Payer: Self-pay | Admitting: Internal Medicine

## 2018-08-26 MED FILL — HUMIRA PEN 40 MG/0.4ML PNKT: 40 | 28 days supply | Qty: 4 | Fill #4

## 2018-08-26 MED FILL — ALPRAZolam 1 MG TABS: 1 | 30 days supply | Qty: 30 | Fill #1

## 2018-08-26 MED FILL — PROMETHAZINE 25 MG TABLET: 25 | 10 days supply | Qty: 30 | Fill #0

## 2018-08-26 MED FILL — GABAPENTIN 600 MG TABLET: 600 | 30 days supply | Qty: 90 | Fill #1

## 2018-08-27 ENCOUNTER — Other Ambulatory Visit: Payer: Self-pay

## 2018-08-27 MED ORDER — PROMETHAZINE HCL 25 MG PO TABS: ORAL_TABLET | ORAL | 11 refills | Status: DC

## 2018-09-10 ENCOUNTER — Encounter: Payer: Self-pay | Admitting: Obstetrics & Gynecology

## 2018-09-11 ENCOUNTER — Telehealth: Payer: Self-pay | Admitting: *Deleted

## 2018-09-11 NOTE — Telephone Encounter (Signed)
Spoke with patient. She is complaining of vaginal itching and discharge. Patient used Metrogel last pm. Advised will not be able to do testing today due to Metrogel. Offered appointment for tomorrow--patient declined stating she is working. Patient's next available date to come to office is 09-18-18. Made appointment with Dr.Miller 09-18-18 8:00am. Patient will call back if symptoms worsen.

## 2018-09-11 NOTE — Telephone Encounter (Signed)
Hello Dr. Sabra Heck,     I hope you are doing well, i wanted to reach out to you because I recently started having some worsening vaginal itching and increase drainage like I was having before with the vaginitis. I know, I know,I am a piece of work !   Please advise.     Thank

## 2018-09-12 MED FILL — DIVIGEL 1 MG GEL PACKET: 1 | 90 days supply | Qty: 90 | Fill #0

## 2018-09-15 MED FILL — LEVOTHYROXINE 100 MCG TAB: 100 | 90 days supply | Qty: 90 | Fill #1

## 2018-09-16 ENCOUNTER — Other Ambulatory Visit: Payer: Self-pay

## 2018-09-18 ENCOUNTER — Encounter: Payer: Self-pay | Admitting: Obstetrics & Gynecology

## 2018-09-18 ENCOUNTER — Other Ambulatory Visit: Payer: Self-pay

## 2018-09-18 ENCOUNTER — Ambulatory Visit (INDEPENDENT_AMBULATORY_CARE_PROVIDER_SITE_OTHER): Payer: No Typology Code available for payment source | Admitting: Obstetrics & Gynecology

## 2018-09-18 VITALS — BP 102/70 | HR 64 | Temp 97.3°F | Ht 67.0 in | Wt 246.6 lb

## 2018-09-18 DIAGNOSIS — R5382 Chronic fatigue, unspecified: Secondary | ICD-10-CM

## 2018-09-18 DIAGNOSIS — N898 Other specified noninflammatory disorders of vagina: Secondary | ICD-10-CM | POA: Diagnosis not present

## 2018-09-18 DIAGNOSIS — F5089 Other specified eating disorder: Secondary | ICD-10-CM | POA: Diagnosis not present

## 2018-09-18 DIAGNOSIS — R3915 Urgency of urination: Secondary | ICD-10-CM

## 2018-09-18 MED ORDER — ESTRADIOL 0.1 MG/GM VA CREA: TOPICAL_CREAM | VAGINAL | 1 refills | Status: DC

## 2018-09-18 MED ORDER — TERCONAZOLE 0.4 % VA CREA: 1.0000 | TOPICAL_CREAM | Freq: Every day | VAGINAL | 0 refills | Status: DC

## 2018-09-18 MED FILL — TERCONAZOLE 0.4% CREAM: 0.4 | 7 days supply | Qty: 45 | Fill #0

## 2018-09-18 MED FILL — ESTRADIOL 0.1 MG/GM CREA: 0.1 | 90 days supply | Qty: 43 | Fill #0

## 2018-09-18 NOTE — Progress Notes (Signed)
GYNECOLOGY  VISIT  CC:   Vaginal itching x 2 weeks.  HPI: 58 y.o. G2P0112 Married Victoria [name] female here for vaginal itching used metrogel last week.  Reports she's having internal vaginal itching for the past two weeks.  She had some left-over metrogel and used it for three days.  She hasn't used it since last Friday.    She is still having some vaginal discharge.  Reports some increased urinary urgency but unsure if this is related to the vaginal discharge.  Is getting up once at night.  Reports increased craving and eating ice.  H/o mildly decreased WBC ct.  GYNECOLOGIC HISTORY: No LMP recorded. Patient has had a hysterectomy. Contraception: hysterectomy  Menopausal hormone therapy: none  Patient Active Problem List   Diagnosis Date Noted  . OA (osteoarthritis) of knee 01/20/2018  . Acute meniscal tear, medial 07/17/2017  . 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 Insomnia 11/08/2016  . Pernicious anemia 11/08/2016  . Leukopenia 07/09/2016  . Hypocupremia 07/09/2016  . Palpitations 04/12/2016  . 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 Lap gastric bypass May 2016 07/06/2014  . History of Clostridium difficile infection 08/12/2011  . Postoperative hypothyroidism 08/26/2010  . Gastroesophageal reflux disease 08/26/2010  . Vitamin D deficiency 08/26/2010  . Fibrocystic disease of breast 08/26/2010  . Cobalamin deficiency 08/26/2010  . Obesity 03/20/2010  . Backache 03/20/2010    Past Medical History:  Diagnosis Date  . Chronic leukopenia followed by pcp (previously followed by hematology-- dr Alvy Bimler)   intermittant since 2010  . Degenerative joint disease of low back 09/02/15    Dr. Maureen Ralphs  . Fibrocystic breast   . GERD (gastroesophageal reflux disease)   . Headache    per pt more stress/tension  . Heart palpitations    SEE EPIC [facility] , CARDIOLOGY  DR. Tressia Miners TURNER 2018; reports on 05-16-17 " i haven't had any bouts of those lately"    . Hemorrhoids   . History of Clostridium difficile infection 12/2010  . History of exercise intolerance    ETT on 04-27-2016-- negative ([facility] treadmill score 7)  . History of Helicobacter pylori infection 2001 and 09/ 2012  . HSV-2 infection    genital  . Hx of adenomatous colonic polyps 10/17/2007  . Hydradenitis    per pt currently treated with Humira injection  . Hypothyroidism, postsurgical 1980  . Osteoarthritis   . Pernicious anemia    b12 def  . PONV (postoperative nausea and vomiting)   . 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 Varicose veins   . Vitamin B 12 deficiency   . Vitamin D deficiency     Past Surgical History:  Procedure Laterality Date  . BREATH TEK H PYLORI N/A 05/03/2014   Procedure: BREATH TEK H PYLORI;  Surgeon: Kaylyn Lim, MD;  [facility];  Service: General;  Laterality: N/A;  . CARDIOVASCULAR STRESS TEST  03/11/2013   dr Tressia Miners turner   Low risk nuclear study w/ a very small anterior perfusion defect that most likely represents breast attenuation artifact, less likely this could represent a true perfusion abnormality in the distribution of diagonal branch of the LAD/  normal LV function and wall motion , ef 66%  . CATARACT EXTRACTION W/ INTRAOCULAR LENS  IMPLANT, BILATERAL  10/2017  . CESAREAN SECTION  1985  .  CHOLECYSTECTOMY OPEN  1987  . COLONOSCOPY  02/2017   Dr [name] ; per patient they found 7 polyps with polypectomy; on 5 year track   . FINE NEEDLE ASPIRATION Left 05/22/2017   Procedure: LEFT KNEE ASPIRATION;  Surgeon: Gaynelle Arabian, MD;  [facility];  Service: Orthopedics;  Laterality: Left;  Marland Kitchen GASTRIC ROUX-EN-Y N/A 07/06/2014   Procedure: LAPAROSCOPIC ROUX-EN-Y GASTRIC BYPASS, ENTEROLYSIS OF ADHESIONS WITH UPPER ENDOSCOPY;  Surgeon: Johnathan Hausen, MD;  [facility];  Service: General;  Laterality: N/A;  . HAMMER TOE SURGERY Bilateral 02/17/2008   bilateral foot digit's 2 and 5  . HIATAL HERNIA REPAIR N/A 07/06/2014   Procedure: LAPAROSCOPIC REPAIR OF HIATAL HERNIA;  Surgeon: Johnathan Hausen, MD;  [facility];  Service: General;  Laterality: N/A;  . KNEE ARTHROSCOPY WITH MEDIAL MENISECTOMY Left 07/17/2017   Procedure: LEFT KNEE ARTHROSCOPY WITH MEDIAL LATERAL MENISECTOMY;  Surgeon: Gaynelle Arabian, MD;  [facility];  Service: Orthopedics;  Laterality: Left;  Marland Kitchen MASS EXCISION N/A 10/11/2016   Procedure: EXCISION OF PERIANAL MASS;  Surgeon: [name], MD;  Location: [place];  Service: General;  Laterality: N/A;  . REFRACTIVE SURGERY    . THYROIDECTOMY  1980  . TOTAL ABDOMINAL HYSTERECTOMY  08-25-2002   dr Margaretha Glassing   ovaries retained  . TOTAL HIP ARTHROPLASTY Right 05/21/2012   Procedure: TOTAL HIP ARTHROPLASTY;  Surgeon: Gearlean Alf, MD;  [facility];  Service: Orthopedics;  Laterality: Right;  . TOTAL HIP ARTHROPLASTY Left 01-31-2009  dr Wynelle Link  . TOTAL HIP REVISION Left 05/22/2017   Procedure: Left hip bearing surface and head revision;  Surgeon: Gaynelle Arabian, MD;  [facility];  Service: Orthopedics;  Laterality: Left;  . TOTAL KNEE ARTHROPLASTY Left 01/20/2018   Procedure: LEFT TOTAL KNEE ARTHROPLASTY;  Surgeon: Gaynelle Arabian, MD;  [facility];  Service: Orthopedics;  Laterality: Left;  35min  . TRANSTHORACIC ECHOCARDIOGRAM  05/03/2016   ef 55-60%/  trivial MR/  mild TR    MEDS:   Current Outpatient Medications on File Prior to Visit  Medication Sig Dispense Refill  . acetaminophen (TYLENOL) 500 MG tablet Take 1,000 mg by mouth every 6 (six) hours as needed (for pain).    . Adalimumab (HUMIRA PEN) 40 MG/0.4ML PNKT Inject 1 pen into the skin once a week. 2 each 6  . ALPRAZolam (XANAX) 1 MG tablet Take 1 tablet (1 mg total) by mouth at bedtime. 30 tablet 2  . CALCIUM PO Take by mouth. Few times  a week    . cyanocobalamin (,VITAMIN B-12,) 1000 MCG/ML injection INJECT 1 ML INTRAMUSCULARLY EVERY MONTH 25 mL 0  . cyclobenzaprine (FLEXERIL) 10 MG tablet TAKE 1 TABLET BY MOUTH AT BEDTIME 90 tablet 3  . DIVIGEL 1 MG/GM GEL     . gabapentin (NEURONTIN) 600 MG tablet Take 1 tablet (600 mg total) by mouth 3 (three) times daily. 90 tablet 2  . HYDROcodone-acetaminophen (NORCO) 10-325 MG tablet Take 1 tablet by mouth every 4 (four) hours as needed. 30 tablet 0  . levothyroxine (SYNTHROID) 100 MCG tablet TAKE 1 TABLET BY MOUTH ONCE DAILY 90 tablet 1  . Multiple Vitamin (MULTIVITAMIN PO) Take by mouth.    . pantoprazole (PROTONIX) 40 MG tablet TAKE 1 TABLET BY MOUTH TWICE DAILY--- PRN    . polyethylene glycol (MIRALAX / GLYCOLAX) packet Take 17 g by mouth daily as needed for mild constipation.    . promethazine (PHENERGAN) 25 MG tablet TAKE 1  TABLET BY MOUTH EVERY 8 HOURS AS NEEDED FOR NAUSEA AND VOMITING 30 tablet 11  . valACYclovir (VALTREX) 500 MG tablet TAKE 1 TABLET BY MOUTH ONCE DAILY 90 tablet 3   No current facility-administered medications on file prior to visit.     ALLERGIES: Other and Estradiol  Family History  Problem Relation Age of Onset  . Diabetes Father   . Diabetes Sister   . Hypertension Sister   . Hypertension Mother     SH:  Married, non smoker  Review of Systems  Genitourinary: Positive for vaginal discharge.       Itching   Neurological: Positive for light-headedness.  All other systems reviewed and are negative.   PHYSICAL EXAMINATION:    BP 102/70   Pulse 64   Temp (!) 97.3 F (36.3 C) (Temporal)   Ht 5\' 7"  (1.702 m)   Wt 246 lb 9.6 oz (111.9 kg)   BMI 38.62 kg/m     General appearance: alert, cooperative and appears stated age Lymph:  no inguinal LAD noted  Pelvic: External genitalia:  no lesions              Urethra:  normal appearing urethra with no masses, tenderness or lesions              Bartholins and Skenes: normal                  Vagina: significant amount of white, clumpy vaginal discharge, no vaginal lesions              Cervix: absent              Bimanual Exam:  Uterus:  uterus absent              Adnexa: no mass, fullness, tenderness  Chaperone was present for exam.  Assessment: Vaginal discharge and itching Atrophic changes PICA (ice) Fatigue Lightheadedness H/o pernicious anemia (normal folate and b12 in 02/2018)  Plan: CBC, iron studies obtained today TSH and free t4 obtained Affirm obtained today Terazol 7 nightly sent to pharmacy Urine culture pending Estrace vaginal cream 1 gm pv twice weekly.  Will recheck 3 months.  If no improvement, consider mona lisa touch

## 2018-09-18 NOTE — Patient Instructions (Signed)
Josph Macho Touch

## 2018-09-19 LAB — CBC WITH DIFFERENTIAL/PLATELET
Basophils Absolute: 0 10*3/uL (ref 0.0–0.2)
Basos: 1 %
EOS (ABSOLUTE): 0.2 10*3/uL (ref 0.0–0.4)
Eos: 7 %
Hematocrit: 35.3 % (ref 34.0–46.6)
Hemoglobin: 10.9 g/dL — ABNORMAL LOW (ref 11.1–15.9)
Immature Grans (Abs): 0 10*3/uL (ref 0.0–0.1)
Immature Granulocytes: 0 %
Lymphocytes Absolute: 1.3 10*3/uL (ref 0.7–3.1)
Lymphs: 44 %
MCH: 28.9 pg (ref 26.6–33.0)
MCHC: 30.9 g/dL — ABNORMAL LOW (ref 31.5–35.7)
MCV: 94 fL (ref 79–97)
Monocytes Absolute: 0.4 10*3/uL (ref 0.1–0.9)
Monocytes: 13 %
NRBC: 1 % — ABNORMAL HIGH (ref 0–0)
Neutrophils Absolute: 1 10*3/uL — ABNORMAL LOW (ref 1.4–7.0)
Neutrophils: 35 %
Platelets: 233 10*3/uL (ref 150–450)
RBC: 3.77 x10E6/uL (ref 3.77–5.28)
RDW: 14.8 % (ref 11.7–15.4)
WBC: 2.9 10*3/uL — ABNORMAL LOW (ref 3.4–10.8)

## 2018-09-19 LAB — URINE CULTURE

## 2018-09-19 LAB — IRON,TIBC AND FERRITIN PANEL
Ferritin: 11 ng/mL — ABNORMAL LOW (ref 15–150)
Iron Saturation: 12 % — ABNORMAL LOW (ref 15–55)
Iron: 49 ug/dL (ref 27–159)
Total Iron Binding Capacity: 415 ug/dL (ref 250–450)
UIBC: 366 ug/dL (ref 131–425)

## 2018-09-19 LAB — VAGINITIS/VAGINOSIS, DNA PROBE
Candida Species: NEGATIVE
Gardnerella vaginalis: NEGATIVE
Trichomonas vaginosis: NEGATIVE

## 2018-09-19 LAB — T4, FREE: Free T4: 1.05 ng/dL (ref 0.82–1.77)

## 2018-09-19 LAB — TSH: TSH: 3.6 u[IU]/mL (ref 0.450–4.500)

## 2018-09-21 MED FILL — HUMIRA PEN 40 MG/0.4ML PNKT: 40 | 28 days supply | Qty: 4 | Fill #5

## 2018-09-23 ENCOUNTER — Other Ambulatory Visit: Payer: Self-pay | Admitting: Obstetrics & Gynecology

## 2018-09-23 MED ORDER — ESTRADIOL 0.1 MG/GM VA CREA: TOPICAL_CREAM | VAGINAL | 1 refills | Status: DC

## 2018-09-24 ENCOUNTER — Other Ambulatory Visit: Payer: Self-pay | Admitting: Obstetrics & Gynecology

## 2018-09-24 DIAGNOSIS — K909 Intestinal malabsorption, unspecified: Secondary | ICD-10-CM

## 2018-09-24 DIAGNOSIS — D508 Other iron deficiency anemias: Secondary | ICD-10-CM

## 2018-09-24 DIAGNOSIS — R790 Abnormal level of blood mineral: Secondary | ICD-10-CM

## 2018-09-24 NOTE — Progress Notes (Signed)
Referral to Dr. Marin Olp placed.

## 2018-09-25 MED FILL — CYANOCOBALAMIN 1,000 MCG/ML: 1000 | 84 days supply | Qty: 3 | Fill #2

## 2018-09-29 ENCOUNTER — Telehealth: Payer: Self-pay | Admitting: Family

## 2018-09-29 NOTE — Telephone Encounter (Signed)
lmom to inform patient of new patient appointment 8/20 at 11 am. Provided date/time/ location of appointments

## 2018-10-02 ENCOUNTER — Ambulatory Visit (INDEPENDENT_AMBULATORY_CARE_PROVIDER_SITE_OTHER): Payer: No Typology Code available for payment source | Admitting: Internal Medicine

## 2018-10-02 ENCOUNTER — Encounter: Payer: Self-pay | Admitting: Internal Medicine

## 2018-10-02 ENCOUNTER — Other Ambulatory Visit: Payer: Self-pay

## 2018-10-02 VITALS — BP 100/60 | HR 69 | Temp 98.3°F | Ht 67.0 in | Wt 245.0 lb

## 2018-10-02 DIAGNOSIS — E039 Hypothyroidism, unspecified: Secondary | ICD-10-CM | POA: Diagnosis not present

## 2018-10-02 DIAGNOSIS — R5383 Other fatigue: Secondary | ICD-10-CM | POA: Diagnosis not present

## 2018-10-02 DIAGNOSIS — Z9884 Bariatric surgery status: Secondary | ICD-10-CM | POA: Diagnosis not present

## 2018-10-02 DIAGNOSIS — E538 Deficiency of other specified B group vitamins: Secondary | ICD-10-CM

## 2018-10-02 DIAGNOSIS — D649 Anemia, unspecified: Secondary | ICD-10-CM

## 2018-10-02 MED ORDER — LEVOTHYROXINE SODIUM 112 MCG PO TABS: 112.0000 ug | ORAL_TABLET | Freq: Every day | ORAL | 0 refills | Status: DC

## 2018-10-02 MED FILL — LEVOTHYROXINE 112 MCG TAB: 112 | 90 days supply | Qty: 90 | Fill #0

## 2018-10-02 NOTE — Progress Notes (Signed)
   Subjective:    Patient ID: Victoria [name], female    DOB: [birth year], 58 y.o.   MRN: [id number]  HPI   58 year old Female for 2-month follow-up.  History of hypothyroidism, iron deficiency anemia, and decreased WBC on Humira for hidradenitis. Has had hysterectomy.  Simply not feeling well at this point in time.  Has lack of energy.  May be related to iron deficiency.  History of gastric bypass surgery.  Had hysterectomy in 2004.  History of thyroidectomy 1980 treated with thyroid replacement therapy.  Has been referred to Dr. Marin [name] for evaluation.  We have checked vitamin D level which is normal, B12 level which is stable.  She gives herself vitamin B12 injections monthly.  Free T4 and TSH are normal.  We are going to provide note for her to stay out of work for several days.  She will see Dr. Marin [name] August 20.    Review of Systems see above main complaint is fatigue     Objective:   Physical Exam  Neck supple.  Chest clear.  Cardiac exam regular rate and rhythm.  She looks fatigued.  Vital signs reviewed.      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 Marin [name].

## 2018-10-03 ENCOUNTER — Ambulatory Visit: Payer: No Typology Code available for payment source | Admitting: Internal Medicine

## 2018-10-03 LAB — COMPLETE METABOLIC PANEL WITH GFR
AG Ratio: 1.2 (calc) (ref 1.0–2.5)
ALT: 28 U/L (ref 6–29)
AST: 31 U/L (ref 10–35)
Albumin: 4 g/dL (ref 3.6–5.1)
Alkaline phosphatase (APISO): 60 U/L (ref 37–153)
BUN: 15 mg/dL (ref 7–25)
CO2: 27 mmol/L (ref 20–32)
Calcium: 9.3 mg/dL (ref 8.6–10.4)
Chloride: 108 mmol/L (ref 98–110)
Creat: 0.88 mg/dL (ref 0.50–1.05)
GFR, Est African American: 85 mL/min/{1.73_m2} (ref >=60)
GFR, Est Non African American: 73 mL/min/{1.73_m2} (ref >=60)
Globulin: 3.3 g/dL (calc) (ref 1.9–3.7)
Glucose, Bld: 85 mg/dL (ref 65–99)
Potassium: 5.4 mmol/L — ABNORMAL HIGH (ref 3.5–5.3)
Sodium: 142 mmol/L (ref 135–146)
Total Bilirubin: 0.4 mg/dL (ref 0.2–1.2)
Total Protein: 7.3 g/dL (ref 6.1–8.1)

## 2018-10-03 LAB — RETICULOCYTES
ABS Retic: 36500 cells/uL (ref 20000–8000)
Retic Ct Pct: 1 %

## 2018-10-03 LAB — VITAMIN D 25 HYDROXY (VIT D DEFICIENCY, FRACTURES): Vit D, 25-Hydroxy: 41 ng/mL (ref 30–100)

## 2018-10-03 LAB — EXTRA SPECIMEN

## 2018-10-03 LAB — FOLATE: Folate: 23.5 ng/mL

## 2018-10-03 LAB — VITAMIN B12: Vitamin B-12: 1079 pg/mL (ref 200–1100)

## 2018-10-15 ENCOUNTER — Other Ambulatory Visit: Payer: Self-pay | Admitting: Family

## 2018-10-15 DIAGNOSIS — D649 Anemia, unspecified: Secondary | ICD-10-CM

## 2018-10-16 ENCOUNTER — Inpatient Hospital Stay: Payer: No Typology Code available for payment source | Attending: Family | Admitting: Family

## 2018-10-16 ENCOUNTER — Other Ambulatory Visit: Payer: Self-pay | Admitting: Pharmacist

## 2018-10-16 ENCOUNTER — Encounter: Payer: Self-pay | Admitting: Family

## 2018-10-16 ENCOUNTER — Telehealth: Payer: Self-pay | Admitting: Family

## 2018-10-16 ENCOUNTER — Other Ambulatory Visit: Payer: Self-pay

## 2018-10-16 ENCOUNTER — Inpatient Hospital Stay: Payer: No Typology Code available for payment source

## 2018-10-16 DIAGNOSIS — D509 Iron deficiency anemia, unspecified: Secondary | ICD-10-CM | POA: Diagnosis present

## 2018-10-16 DIAGNOSIS — Z9884 Bariatric surgery status: Secondary | ICD-10-CM | POA: Insufficient documentation

## 2018-10-16 DIAGNOSIS — K909 Intestinal malabsorption, unspecified: Secondary | ICD-10-CM

## 2018-10-16 DIAGNOSIS — D649 Anemia, unspecified: Secondary | ICD-10-CM

## 2018-10-16 DIAGNOSIS — K9089 Other intestinal malabsorption: Secondary | ICD-10-CM | POA: Insufficient documentation

## 2018-10-16 DIAGNOSIS — D508 Other iron deficiency anemias: Secondary | ICD-10-CM

## 2018-10-16 LAB — SAVE SMEAR(SSMR), FOR PROVIDER SLIDE REVIEW

## 2018-10-16 LAB — IRON AND TIBC
Iron: 129 ug/dL (ref 41–142)
Saturation Ratios: 31 % (ref 21–57)
TIBC: 413 ug/dL (ref 236–444)
UIBC: 284 ug/dL (ref 120–384)

## 2018-10-16 LAB — CMP (CANCER CENTER ONLY)
ALT: 26 U/L (ref 0–44)
AST: 26 U/L (ref 15–41)
Albumin: 3.7 g/dL (ref 3.5–5.0)
Alkaline Phosphatase: 59 U/L (ref 38–126)
Anion gap: 5 (ref 5–15)
BUN: 14 mg/dL (ref 6–20)
CO2: 29 mmol/L (ref 22–32)
Calcium: 8.6 mg/dL — ABNORMAL LOW (ref 8.9–10.3)
Chloride: 105 mmol/L (ref 98–111)
Creatinine: 0.89 mg/dL (ref 0.44–1.00)
GFR, Est AFR Am: >60 mL/min (ref >60)
GFR, Estimated: >60 mL/min (ref >60)
Glucose, Bld: 87 mg/dL (ref 70–99)
Potassium: 4.3 mmol/L (ref 3.5–5.1)
Sodium: 139 mmol/L (ref 135–145)
Total Bilirubin: 0.5 mg/dL (ref 0.3–1.2)
Total Protein: 6.8 g/dL (ref 6.5–8.1)

## 2018-10-16 LAB — CBC WITH DIFFERENTIAL (CANCER CENTER ONLY)
Abs Immature Granulocytes: 0 10*3/uL (ref 0.00–0.07)
Basophils Absolute: 0 10*3/uL (ref 0.0–0.1)
Basophils Relative: 1 %
Eosinophils Absolute: 0.2 10*3/uL (ref 0.0–0.5)
Eosinophils Relative: 5 %
HCT: 33.1 % — ABNORMAL LOW (ref 36.0–46.0)
Hemoglobin: 10.5 g/dL — ABNORMAL LOW (ref 12.0–15.0)
Immature Granulocytes: 0 %
Lymphocytes Relative: 38 %
Lymphs Abs: 1.3 10*3/uL (ref 0.7–4.0)
MCH: 28.9 pg (ref 26.0–34.0)
MCHC: 31.7 g/dL (ref 30.0–36.0)
MCV: 91.2 fL (ref 80.0–100.0)
Monocytes Absolute: 0.4 10*3/uL (ref 0.1–1.0)
Monocytes Relative: 11 %
Neutro Abs: 1.5 10*3/uL — ABNORMAL LOW (ref 1.7–7.7)
Neutrophils Relative %: 45 %
Platelet Count: 223 10*3/uL (ref 150–400)
RBC: 3.63 MIL/uL — ABNORMAL LOW (ref 3.87–5.11)
RDW: 15.8 % — ABNORMAL HIGH (ref 11.5–15.5)
WBC Count: 3.4 10*3/uL — ABNORMAL LOW (ref 4.0–10.5)
nRBC: 0 % (ref 0.0–0.2)

## 2018-10-16 LAB — LACTATE DEHYDROGENASE: LDH: 123 U/L (ref 98–192)

## 2018-10-16 LAB — RETICULOCYTES
Immature Retic Fract: 18.4 % — ABNORMAL HIGH (ref 2.3–15.9)
RBC.: 3.65 MIL/uL — ABNORMAL LOW (ref 3.87–5.11)
Retic Count, Absolute: 38.7 10*3/uL (ref 19.0–186.0)
Retic Ct Pct: 1.1 % (ref 0.4–3.1)

## 2018-10-16 LAB — FERRITIN: Ferritin: 5 ng/mL — ABNORMAL LOW (ref 11–307)

## 2018-10-16 MED ORDER — HUMIRA (2 PEN) 40 MG/0.4ML ~~LOC~~ AJKT: 1.0000 "pen " | AUTO-INJECTOR | SUBCUTANEOUS | 6 refills | Status: DC

## 2018-10-16 MED FILL — HUMIRA PEN 40 MG/0.4ML PNKT: 40 | 28 days supply | Qty: 4 | Fill #0

## 2018-10-16 NOTE — Telephone Encounter (Signed)
Called and LMVM for patient regarding appointments per 8/20 los

## 2018-10-16 NOTE — Progress Notes (Signed)
Hematology/Oncology Consultation   Name: Victoria [name]      MRN: [id number]    Location: Room/bed info not found  Date: 10/16/2018 Time:9:23 AM   REFERRING PHYSICIAN: Megan Salon, MD  REASON FOR CONSULT: iron deficiency anemia secondary to malabsorption   DIAGNOSIS: Iron deficiency anemia secondary to malabsorption, s/p gastric bypass in 2016   HISTORY OF PRESENT ILLNESS: Ms. Victoria [name] is a very pleasant 58 yo African American female with iron deficiency anemia secondary to malabsorption. She had laparoscopic gastric bypass in 2016.  In July, her iron saturation was 12% and ferritin 11. Hgb at this time is 10.5, MCV 91.  She is symptomatic with fatigue, weakness, lightheadedness, SOB with over exertion, occasional palpitations, ice cravings, and numbness and tingling in her fingertips and toes.  She has not noted any blood loss. No bruising or petechiae.  She was transfused in 2010 after hip replacement surgery. She denies every having had IV iron.  She had a total hysterectomy in 2004. She has twins and no history of miscarriage.  She gives herself monthly B 12 injections. B 12 level earlier this month was 1,079.  She is not currently taking her Protonix.  She had a thyroidectomy in 1980 and has been on Synthroid since that time.  She is on weekly Humira injections for Hydradenitis. This is managed by her dermatologist.  She states that her sister was also anemic until having a hysterectomy.  No personal history of cancer. Family history includes several cousins on her mother's side with unknown primaries and one bone cancer.  No fever, chills, cough, rash, chest pain, abdominal pain or changes in bowel or bladder habits.  No swelling or tenderness in her extremities.  She has maintained a good appetite and is staying well hydrated. She does occasionally have nausea after eating or chewing ice but no vomiting. Her weight is stable.  She does not smoke or drink alcoholic beverages.   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  ROS: All other 10 point review of systems is negative.   PAST MEDICAL HISTORY:   Past Medical History:  Diagnosis Date  . Chronic leukopenia followed by pcp (previously followed by hematology-- dr Alvy Bimler)   intermittant since 2010  . Degenerative joint disease of low back 09/02/15    Dr. Maureen Ralphs  . Fibrocystic breast   . GERD (gastroesophageal reflux disease)   . Headache    per pt more stress/tension  . Heart palpitations    SEE EPIC ENCOUTNER , CARDIOLOGY DR. Tressia Miners TURNER 2018; reports on 05-16-17 " i haven't had any bouts of those lately"    . Hemorrhoids   . History of Clostridium difficile infection 12/2010  . History of exercise intolerance    ETT on 04-27-2016-- negative ([facility] treadmill score 7)  . History of Helicobacter pylori infection 2001 and 09/ 2012  . HSV-2 infection    genital  . Hx of adenomatous colonic polyps 10/17/2007  . Hydradenitis    per pt currently treated with Humira injection  . Hypothyroidism, postsurgical 1980  . Osteoarthritis   . Pernicious anemia    b12 def  . PONV (postoperative nausea and vomiting)   . 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 Varicose veins   . Vitamin B 12 deficiency   . Vitamin D deficiency     ALLERGIES: Allergies  Allergen Reactions  . Other Other (See  Comments)    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  . Estradiol Rash and Other (See Comments)    Local rash with estradiol patches      MEDICATIONS:  Current Outpatient Medications on File Prior to Visit  Medication Sig Dispense Refill  . acetaminophen (TYLENOL) 500 MG tablet Take 1,000 mg by mouth every 6 (six) hours as needed (for pain).    . Adalimumab (HUMIRA PEN) 40 MG/0.4ML PNKT Inject 1 pen into the skin once a week. 2 each 6  . ALPRAZolam (XANAX) 1 MG tablet Take 1 tablet (1 mg total) by mouth at  bedtime. 30 tablet 2  . CALCIUM PO Take by mouth. Few times a week    . cyanocobalamin (,VITAMIN B-12,) 1000 MCG/ML injection INJECT 1 ML INTRAMUSCULARLY EVERY MONTH 25 mL 0  . cyclobenzaprine (FLEXERIL) 10 MG tablet TAKE 1 TABLET BY MOUTH AT BEDTIME 90 tablet 3  . DIVIGEL 1 MG/GM GEL     . estradiol (ESTRACE) 0.1 MG/GM vaginal cream 1 gram vaginally twice weekly 42.5 g 1  . gabapentin (NEURONTIN) 600 MG tablet Take 1 tablet (600 mg total) by mouth 3 (three) times daily. 90 tablet 2  . HYDROcodone-acetaminophen (NORCO) 10-325 MG tablet Take 1 tablet by mouth every 4 (four) hours as needed. 30 tablet 0  . levothyroxine (SYNTHROID) 112 MCG tablet Take 1 tablet (112 mcg total) by mouth daily. 90 tablet 0  . Multiple Vitamin (MULTIVITAMIN PO) Take by mouth.    . pantoprazole (PROTONIX) 40 MG tablet TAKE 1 TABLET BY MOUTH TWICE DAILY--- PRN    . polyethylene glycol (MIRALAX / GLYCOLAX) packet Take 17 g by mouth daily as needed for mild constipation.    . promethazine (PHENERGAN) 25 MG tablet TAKE 1 TABLET BY MOUTH EVERY 8 HOURS AS NEEDED FOR NAUSEA AND VOMITING 30 tablet 11  . valACYclovir (VALTREX) 500 MG tablet TAKE 1 TABLET BY MOUTH ONCE DAILY 90 tablet 3   No current facility-administered medications on file prior to visit.      PAST SURGICAL HISTORY Past Surgical History:  Procedure Laterality Date  . BREATH TEK H PYLORI N/A 05/03/2014   Procedure: BREATH TEK H PYLORI;  Surgeon: Kaylyn Lim, MD;  Location: [facility];  Service: General;  Laterality: N/A;  . CARDIOVASCULAR STRESS TEST  03/11/2013   dr Tressia Miners turner   Low risk nuclear study w/ a very small anterior perfusion defect that most likely represents breast attenuation artifact, less likely this could represent a true perfusion abnormality in the distribution of diagonal branch of the LAD/  normal LV function and wall motion , ef 66%  . CATARACT EXTRACTION W/ INTRAOCULAR LENS  IMPLANT, BILATERAL  10/2017  . CESAREAN SECTION  1985  .  CHOLECYSTECTOMY OPEN  1987  . COLONOSCOPY  02/2017   Dr [name] ; per patient they found 7 polyps with polypectomy; on 5 year track   . FINE NEEDLE ASPIRATION Left 05/22/2017   Procedure: LEFT KNEE ASPIRATION;  Surgeon: Gaynelle Arabian, MD;  Location: [facility];  Service: Orthopedics;  Laterality: Left;  Marland Kitchen GASTRIC ROUX-EN-Y N/A 07/06/2014   Procedure: LAPAROSCOPIC ROUX-EN-Y GASTRIC BYPASS, ENTEROLYSIS OF ADHESIONS WITH UPPER ENDOSCOPY;  Surgeon: Johnathan Hausen, MD;  Location: [facility];  Service: General;  Laterality: N/A;  . HAMMER TOE SURGERY Bilateral 02/17/2008   bilateral foot digit's 2 and 5  . HIATAL HERNIA REPAIR N/A 07/06/2014   Procedure: LAPAROSCOPIC REPAIR OF HIATAL HERNIA;  Surgeon: Johnathan Hausen, MD;  Location: [facility];  Service: General;  Laterality: N/A;  . KNEE ARTHROSCOPY WITH MEDIAL MENISECTOMY Left 07/17/2017   Procedure: LEFT KNEE ARTHROSCOPY WITH MEDIAL LATERAL MENISECTOMY;  Surgeon: Gaynelle Arabian, MD;  Location: [facility];  Service: Orthopedics;  Laterality: Left;  Marland Kitchen MASS EXCISION N/A 10/11/2016   Procedure: EXCISION OF PERIANAL MASS;  Surgeon: [name], MD;  Location: [facility];  Service: General;  Laterality: N/A;  . REFRACTIVE SURGERY    . THYROIDECTOMY  1980  . TOTAL ABDOMINAL HYSTERECTOMY  08-25-2002   dr Margaretha Glassing   ovaries retained  . TOTAL HIP ARTHROPLASTY Right 05/21/2012   Procedure: TOTAL HIP ARTHROPLASTY;  Surgeon: Gearlean Alf, MD;  Location: [facility];  Service: Orthopedics;  Laterality: Right;  . TOTAL HIP ARTHROPLASTY Left 01-31-2009  dr Wynelle Link  . TOTAL HIP REVISION Left 05/22/2017   Procedure: Left hip bearing surface and head revision;  Surgeon: Gaynelle Arabian, MD;  Location: [facility];  Service: Orthopedics;  Laterality: Left;  . TOTAL KNEE ARTHROPLASTY Left 01/20/2018   Procedure: LEFT TOTAL KNEE ARTHROPLASTY;  Surgeon: Gaynelle Arabian, MD;  Location: [facility];  Service: Orthopedics;  Laterality: Left;  60min  . TRANSTHORACIC ECHOCARDIOGRAM   05/03/2016   ef 55-60%/  trivial MR/  mild TR    FAMILY HISTORY: Family History  Problem Relation Age of Onset  . Diabetes Father   . Diabetes Sister   . Hypertension Sister   . Hypertension Mother     SOCIAL HISTORY:  reports that she has never smoked. She has never used smokeless tobacco. She reports that she does not drink alcohol or use drugs.  PERFORMANCE STATUS: The patient's performance status is 1 - Symptomatic but completely ambulatory  PHYSICAL EXAM: Most Recent Vital Signs: There were no vitals taken for this visit. There were no vitals taken for this visit.  General Appearance:    Alert, cooperative, no distress, appears stated age  Head:    Normocephalic, without obvious abnormality, atraumatic  Eyes:    PERRL, conjunctiva/corneas clear, EOM's intact, fundi    benign, both eyes        Throat:   Lips, mucosa, and tongue normal; teeth and gums normal  Neck:   Supple, symmetrical, trachea midline, no adenopathy;    thyroid:  no enlargement/tenderness/nodules; no carotid   bruit or JVD  Back:     Symmetric, no curvature, ROM normal, no CVA tenderness  Lungs:     Clear to auscultation bilaterally, respirations unlabored  Chest Wall:    No tenderness or deformity   Heart:    Regular rate and rhythm, S1 and S2 normal, no murmur, rub   or gallop     Abdomen:     Soft, non-tender, bowel sounds active all four quadrants,    no masses, no organomegaly        Extremities:   Extremities normal, atraumatic, no cyanosis or edema  Pulses:   2+ and symmetric all extremities  Skin:   Skin color, texture, turgor normal, no rashes or lesions  Lymph nodes:   Cervical, supraclavicular, and axillary nodes normal  Neurologic:   CNII-XII intact, normal strength, sensation and reflexes    throughout    LABORATORY DATA:  Results for orders placed or performed in visit on 10/16/18 (from the past 48 hour(s))  Save Smear (SSMR)     Status: None   Collection Time: 10/16/18  9:03 AM   Result Value Ref Range   Smear Review SMEAR STAINED AND AVAILABLE FOR REVIEW  Comment: Performed at [facility] at [facility], [address], [place], [place] [address]  CBC with Differential ([place] Only)     Status: Abnormal   Collection Time: 10/16/18  9:03 AM  Result Value Ref Range   WBC Count 3.4 (L) 4.0 - 10.5 K/uL   RBC 3.63 (L) 3.87 - 5.11 MIL/uL   Hemoglobin 10.5 (L) 12.0 - 15.0 g/dL   HCT 33.1 (L) 36.0 - 46.0 %   MCV 91.2 80.0 - 100.0 fL   MCH 28.9 26.0 - 34.0 pg   MCHC 31.7 30.0 - 36.0 g/dL   RDW 15.8 (H) 11.5 - 15.5 %   Platelet Count 223 150 - 400 K/uL   nRBC 0.0 0.0 - 0.2 %   Neutrophils Relative % 45 %   Neutro Abs 1.5 (L) 1.7 - 7.7 K/uL   Lymphocytes Relative 38 %   Lymphs Abs 1.3 0.7 - 4.0 K/uL   Monocytes Relative 11 %   Monocytes Absolute 0.4 0.1 - 1.0 K/uL   Eosinophils Relative 5 %   Eosinophils Absolute 0.2 0.0 - 0.5 K/uL   Basophils Relative 1 %   Basophils Absolute 0.0 0.0 - 0.1 K/uL   Immature Granulocytes 0 %   Abs Immature Granulocytes 0.00 0.00 - 0.07 K/uL    Comment: Performed at [facility] at [facility], [address], [place], [place] [address]  Reticulocytes     Status: Abnormal   Collection Time: 10/16/18  9:04 AM  Result Value Ref Range   Retic Ct Pct 1.1 0.4 - 3.1 %   RBC. 3.65 (L) 3.87 - 5.11 MIL/uL   Retic Count, Absolute 38.7 19.0 - 186.0 K/uL   Immature Retic Fract 18.4 (H) 2.3 - 15.9 %    Comment: Performed at [facility] at [facility], [address], [place], [place] [address]      RADIOGRAPHY: No results found.     PATHOLOGY: None  ASSESSMENT/PLAN: Ms. Docter is a very pleasant 58 yo African American female with iron deficiency anemia secondary to malabsorption since laparoscopic gastric bypass in 2016.  She is symptomatic as mentioned above.  We will see what her iron counts look like and get her set up for IV iron.  We will  go ahead and plan to see her back in another 6 weeks for follow-up.   All questions were answered and she is in agreement with the plan. She will contact our office with any questions or concerns. We can certainly see her sooner if needed.   She was discussed with and also seen by Dr. Marin Olp and he is in agreement with the aforementioned.   Laverna Peace, NP    Addendum: I saw and examined Ms. Owens Shark.  I agree with the above assessment by Judson Roch.  She is very nice.  I think she works for [employer].  It was fun talking to her about this.  I looked at her blood smear under the microscope.  She does have some mild anisocytosis and poikilocytosis.  I do not see really much in the way of target cells.  Her white blood cells appear normal.  There are no hypersegmented polys.  She has no immature myeloid cells.  I see no smudge cells.  Platelets are adequate number and size.  Her iron studies clearly show iron deficiency.  Her ferritin is only 5.  Iron saturation was 31% which  is a little bit unusual.  Again she has a gastric bypass.  She has lost quite a bit of weight.  She just does not absorb iron.  As such, we will give her IV iron.  I know this will help her.  This will make her feel better.  She will have more energy.  We will go ahead and plan for the iron to hopefully start next week.  We will give her 2 doses.  We will see her back in about a month or so, we should see a nice rise in her hemoglobin.  We spent about 45 minutes with her.  We reviewed her lab work.  We answered her questions.  We gave her our recommendations.  She agrees to the IV iron.  Lattie Haw, MD

## 2018-10-17 LAB — ERYTHROPOIETIN: Erythropoietin: 39.6 m[IU]/mL — ABNORMAL HIGH (ref 2.6–18.5)

## 2018-10-19 LAB — HEMOGLOBINOPATHY EVALUATION
Hgb A2 Quant: 2.5 % (ref 1.8–3.2)
Hgb A: 96.8 % (ref 96.4–98.8)
Hgb C: 0 %
Hgb F Quant: 0.7 % (ref 0.0–2.0)
Hgb S Quant: 0 %
Hgb Variant: 0 %

## 2018-10-22 ENCOUNTER — Other Ambulatory Visit: Payer: Self-pay | Admitting: Internal Medicine

## 2018-10-22 MED FILL — CYCLOBENZAPRINE HCL 10 MG T: 10 | 90 days supply | Qty: 90 | Fill #1

## 2018-10-22 NOTE — Telephone Encounter (Signed)
Please call Victoria [name]. I am out of the office. I can't keep refilling narcotics due to state requirements. Does she need to go to chronic pain manegament?

## 2018-10-22 NOTE — Telephone Encounter (Signed)
LVM to CB.

## 2018-10-22 NOTE — Telephone Encounter (Signed)
Victoria [name] called back and verbalized understanding, she is going to think about going to pain management, she really does not want to have surgery. She will call back.

## 2018-10-23 ENCOUNTER — Other Ambulatory Visit: Payer: Self-pay

## 2018-10-23 ENCOUNTER — Inpatient Hospital Stay: Payer: No Typology Code available for payment source

## 2018-10-23 VITALS — BP 102/58 | HR 80 | Temp 97.3°F | Resp 18

## 2018-10-23 DIAGNOSIS — K909 Intestinal malabsorption, unspecified: Secondary | ICD-10-CM

## 2018-10-23 DIAGNOSIS — D509 Iron deficiency anemia, unspecified: Secondary | ICD-10-CM | POA: Diagnosis not present

## 2018-10-23 DIAGNOSIS — D508 Other iron deficiency anemias: Secondary | ICD-10-CM

## 2018-10-23 MED ORDER — SODIUM CHLORIDE 0.9 % IV SOLN
Freq: Once | INTRAVENOUS | Status: AC
  Administered 2018-10-23: 14:00:00 via INTRAVENOUS
  Filled 2018-10-23: qty 250

## 2018-10-23 MED ORDER — SODIUM CHLORIDE 0.9 % IV SOLN
510.0000 mg | Freq: Once | INTRAVENOUS | Status: AC
  Administered 2018-10-23: 510 mg via INTRAVENOUS
  Filled 2018-10-23: qty 17

## 2018-10-23 NOTE — Patient Instructions (Signed)

## 2018-10-27 LAB — ALPHA-THALASSEMIA GENOTYPR

## 2018-10-30 ENCOUNTER — Ambulatory Visit: Payer: No Typology Code available for payment source

## 2018-10-31 ENCOUNTER — Other Ambulatory Visit: Payer: Self-pay

## 2018-10-31 ENCOUNTER — Inpatient Hospital Stay: Payer: No Typology Code available for payment source | Attending: Family

## 2018-10-31 VITALS — BP 95/60 | HR 70 | Temp 97.3°F | Resp 17 | Wt 246.1 lb

## 2018-10-31 DIAGNOSIS — K909 Intestinal malabsorption, unspecified: Secondary | ICD-10-CM

## 2018-10-31 DIAGNOSIS — D509 Iron deficiency anemia, unspecified: Secondary | ICD-10-CM | POA: Diagnosis not present

## 2018-10-31 DIAGNOSIS — D508 Other iron deficiency anemias: Secondary | ICD-10-CM

## 2018-10-31 MED ORDER — SODIUM CHLORIDE 0.9 % IV SOLN
Freq: Once | INTRAVENOUS | Status: AC
  Administered 2018-10-31: 09:00:00 via INTRAVENOUS
  Filled 2018-10-31: qty 250

## 2018-10-31 MED ORDER — SODIUM CHLORIDE 0.9 % IV SOLN
510.0000 mg | Freq: Once | INTRAVENOUS | Status: AC
  Administered 2018-10-31: 510 mg via INTRAVENOUS
  Filled 2018-10-31: qty 510

## 2018-10-31 NOTE — Patient Instructions (Signed)

## 2018-11-12 MED FILL — HUMIRA PEN 40 MG/0.4ML PNKT: 40 | 28 days supply | Qty: 4 | Fill #1

## 2018-11-13 MED FILL — GABAPENTIN 600 MG TABLET: 600 | 30 days supply | Qty: 90 | Fill #2

## 2018-11-13 MED FILL — ALPRAZolam 1 MG TABS: 1 | 30 days supply | Qty: 30 | Fill #2

## 2018-11-15 NOTE — Patient Instructions (Signed)
Labs show iron deficiency and mild leukopenia.  TSH is stable on thyroid replacement therapy.  Follow-up with Dr. Marin Olp.  Likely will be given IV iron infusion.  Note given to be out of 4 for several days.  Follow-up in 6 months for health maintenance exam.  To have flu vaccine through employment.

## 2018-11-18 ENCOUNTER — Encounter: Payer: Self-pay | Admitting: Internal Medicine

## 2018-11-18 ENCOUNTER — Other Ambulatory Visit: Payer: Self-pay

## 2018-11-18 ENCOUNTER — Telehealth: Payer: Self-pay | Admitting: Internal Medicine

## 2018-11-18 ENCOUNTER — Ambulatory Visit (INDEPENDENT_AMBULATORY_CARE_PROVIDER_SITE_OTHER): Payer: No Typology Code available for payment source | Admitting: Internal Medicine

## 2018-11-18 VITALS — BP 90/60 | HR 85 | Ht 67.0 in | Wt 242.0 lb

## 2018-11-18 DIAGNOSIS — R202 Paresthesia of skin: Secondary | ICD-10-CM

## 2018-11-18 MED ORDER — HYDROCODONE-ACETAMINOPHEN 10-325 MG PO TABS: 1.0000 | ORAL_TABLET | Freq: Four times a day (QID) | ORAL | 0 refills | Status: DC | PRN

## 2018-11-18 MED ORDER — RIZATRIPTAN BENZOATE 10 MG PO TABS: 10.0000 mg | ORAL_TABLET | ORAL | 1 refills | Status: DC | PRN

## 2018-11-18 MED FILL — HYDROCODON-APAP 10-325: 10-325 | 5 days supply | Qty: 20 | Fill #0

## 2018-11-18 MED FILL — RIZATRIPTAN BENZOATE 10 MG: 10 | 17 days supply | Qty: 10 | Fill #0

## 2018-11-18 NOTE — Telephone Encounter (Signed)
Victoria [name] scheduled appt

## 2018-11-18 NOTE — Progress Notes (Signed)
   Subjective:    Patient ID: Victoria [name], female    DOB: January 25, 1961, 57 y.o.   MRN: [id number]  HPI 58 year old Female in today with complaint of headache.  Has been having more frequent headaches.  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  There have been some management changes during the pandemic.  Everything is more stressful.  She has been having headaches.  Her husband is in poor health status post back surgery and has chronic back pain.  She is feeling stressed.  We talked about this at length today.  She has a history of gastric bypass surgery.  She has hypothyroidism.  She has B12 deficiency B12 injections.  Recently seen at Hematology for iron deficiency and is receiving iron infusions.  It could be that iron deficiency is causing the headaches.  Hopefully that will improve with time.  However, I think she needs to see a neurologist regarding headache management and possible MRI.  She is worried that she may have an intracranial process that is aggravating the headaches.  My feeling these headaches are triggered by situational stress and sleep deprivation as well as  iron deficiency anemia.  We have discussed management of migraines today.  She will take Maxalt at onset of migraine and not wait.  She will have Phenergan if needed for nausea.  Have given her small quantity of hydrocodone APAP 10/325 to take sparingly for headache.  She might be a candidate for Topamax or tricyclic anti-depressants for headache control.  She might be a candidate for Botox injections or other headache preventative measures.   Review of Systems anxiety and depression.  Situational stress with work.  Fatigue with poor.     Objective:   Physical Exam Blood pressure 90/60 BMI 37.90 weight 242 pounds pulse 85 regular.  She is alert and oriented x3.  Extraocular movements are full without nystagmus.  Normal upper and lower extremity strength.  No gross focal deficits on brief  neurological exam.  Looks fatigued       Assessment & Plan:  Recurrent headaches  Situational stress  Iron deficiency anemia  Status post gastric bypass surgery  Hypothyroidism  Plan: See above and patient will be referred to Neurologist for further evaluation and management of headaches.

## 2018-11-18 NOTE — Telephone Encounter (Signed)
OK 

## 2018-11-18 NOTE — Telephone Encounter (Signed)
Pt having headaches and they run down into her eye, She had one last Thursday and didn't start feeling better until Saturday, She took some tylenol for it and it calms it down but doesn't make it go away, she has another one now that started this morning. She wants to know if she can have an appt this afternoon.

## 2018-11-25 NOTE — Patient Instructions (Signed)
She will be referred to Neurologist for further evaluation and management of headaches

## 2018-11-26 ENCOUNTER — Other Ambulatory Visit: Payer: Self-pay

## 2018-11-26 DIAGNOSIS — R519 Headache, unspecified: Secondary | ICD-10-CM

## 2018-11-27 ENCOUNTER — Other Ambulatory Visit: Payer: Self-pay

## 2018-11-27 ENCOUNTER — Inpatient Hospital Stay: Payer: No Typology Code available for payment source | Attending: Family

## 2018-11-27 ENCOUNTER — Telehealth: Payer: Self-pay | Admitting: Family

## 2018-11-27 ENCOUNTER — Inpatient Hospital Stay (HOSPITAL_BASED_OUTPATIENT_CLINIC_OR_DEPARTMENT_OTHER): Payer: No Typology Code available for payment source | Admitting: Family

## 2018-11-27 ENCOUNTER — Encounter: Payer: Self-pay | Admitting: Family

## 2018-11-27 VITALS — BP 117/73 | HR 71 | Temp 97.3°F | Resp 18 | Wt 251.0 lb

## 2018-11-27 DIAGNOSIS — Z9884 Bariatric surgery status: Secondary | ICD-10-CM | POA: Diagnosis not present

## 2018-11-27 DIAGNOSIS — K909 Intestinal malabsorption, unspecified: Secondary | ICD-10-CM | POA: Diagnosis not present

## 2018-11-27 DIAGNOSIS — D508 Other iron deficiency anemias: Secondary | ICD-10-CM

## 2018-11-27 DIAGNOSIS — D509 Iron deficiency anemia, unspecified: Secondary | ICD-10-CM | POA: Insufficient documentation

## 2018-11-27 LAB — CBC WITH DIFFERENTIAL (CANCER CENTER ONLY)
Abs Immature Granulocytes: 0.02 10*3/uL (ref 0.00–0.07)
Basophils Absolute: 0 10*3/uL (ref 0.0–0.1)
Basophils Relative: 1 %
Eosinophils Absolute: 0.1 10*3/uL (ref 0.0–0.5)
Eosinophils Relative: 3 %
HCT: 37.2 % (ref 36.0–46.0)
Hemoglobin: 12 g/dL (ref 12.0–15.0)
Immature Granulocytes: 1 %
Lymphocytes Relative: 40 %
Lymphs Abs: 1.5 10*3/uL (ref 0.7–4.0)
MCH: 30.8 pg (ref 26.0–34.0)
MCHC: 32.3 g/dL (ref 30.0–36.0)
MCV: 95.4 fL (ref 80.0–100.0)
Monocytes Absolute: 0.4 10*3/uL (ref 0.1–1.0)
Monocytes Relative: 10 %
Neutro Abs: 1.7 10*3/uL (ref 1.7–7.7)
Neutrophils Relative %: 45 %
Platelet Count: 216 10*3/uL (ref 150–400)
RBC: 3.9 MIL/uL (ref 3.87–5.11)
RDW: 18.6 % — ABNORMAL HIGH (ref 11.5–15.5)
WBC Count: 3.6 10*3/uL — ABNORMAL LOW (ref 4.0–10.5)
nRBC: 0 % (ref 0.0–0.2)

## 2018-11-27 LAB — RETICULOCYTES
Immature Retic Fract: 12.4 % (ref 2.3–15.9)
RBC.: 3.87 MIL/uL (ref 3.87–5.11)
Retic Count, Absolute: 52.2 10*3/uL (ref 19.0–186.0)
Retic Ct Pct: 1.4 % (ref 0.4–3.1)

## 2018-11-27 LAB — IRON AND TIBC
Iron: 117 ug/dL (ref 41–142)
Saturation Ratios: 42 % (ref 21–57)
TIBC: 280 ug/dL (ref 236–444)
UIBC: 164 ug/dL (ref 120–384)

## 2018-11-27 LAB — FERRITIN: Ferritin: 186 ng/mL (ref 11–307)

## 2018-11-27 NOTE — Progress Notes (Signed)
Hematology and Oncology Follow Up Visit  Victoria [name] [id number] [birth year] 58 y.o. 11/27/2018   Principle Diagnosis:  Iron deficiency anemia secondary to malabsorption, s/p gastric bypass in 2016   Current Therapy:   IV iron as indicated    Interim History:  Victoria [name] is here today for follow-up. She feel that her energy has improved quite a bit and and she seems to have responded nicely to the IV iron she received in August/September.  Hgb is up to 12.0 and MCV 95.4.  No fever, chills, cough, rash, dizziness, chest pain, palpitations, abdominal pain or changes in bowel or bladder habits.  She has noted getting winded with over exertion as well as nausea with hot flashes over the last few days. She takes a break to rest when needed and feels this may be related to menopause. No swelling, tenderness, numbness or tingling in her extremities at this time. She will occasionally have swelling in her left ankle since having her knee replacement.  No falls or syncope.  She has a good appetite and is staying well hydrated. Her weight is stable.   ECOG Performance Status: 1 - Symptomatic but completely ambulatory  Medications:  Allergies as of 11/27/2018      Reactions   Other Other (See Comments)   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   Estradiol Rash, Other (See Comments)   Local rash with estradiol patches      Medication List       Accurate as of November 27, 2018  8:49 AM. If you have any questions, ask your nurse or doctor.        acetaminophen 500 MG tablet Commonly known as: TYLENOL Take 1,000 mg by mouth every 6 (six) hours as needed (for pain).   ALPRAZolam 1 MG tablet Commonly known as: XANAX Take 1 tablet (1 mg total) by mouth at bedtime.   CALCIUM PO Take by mouth. Few times a week   cyanocobalamin 1000 MCG/ML injection Commonly known as: (VITAMIN B-12) INJECT 1 ML INTRAMUSCULARLY EVERY MONTH   cyclobenzaprine 10 MG  tablet Commonly known as: FLEXERIL TAKE 1 TABLET BY MOUTH AT BEDTIME   Divigel 1 MG/GM Gel Generic drug: Estradiol   estradiol 0.1 MG/GM vaginal cream Commonly known as: ESTRACE 1 gram vaginally twice weekly   gabapentin 600 MG tablet Commonly known as: Neurontin Take 1 tablet (600 mg total) by mouth 3 (three) times daily.   Humira Pen 40 MG/0.4ML Pnkt Generic drug: Adalimumab Inject 1 pen into the skin once a week.   HYDROcodone-acetaminophen 10-325 MG tablet Commonly known as: NORCO Take 1 tablet by mouth every 6 (six) hours as needed.   levothyroxine 112 MCG tablet Commonly known as: SYNTHROID Take 1 tablet (112 mcg total) by mouth daily.   MULTIVITAMIN PO Take by mouth.   pantoprazole 40 MG tablet Commonly known as: PROTONIX TAKE 1 TABLET BY MOUTH TWICE DAILY--- PRN   polyethylene glycol 17 g packet Commonly known as: MIRALAX / GLYCOLAX Take 17 g by mouth daily as needed for mild constipation.   promethazine 25 MG tablet Commonly known as: PHENERGAN TAKE 1 TABLET BY MOUTH EVERY 8 HOURS AS NEEDED FOR NAUSEA AND VOMITING   rizatriptan 10 MG tablet Commonly known as: Maxalt Take 1 tablet (10 mg total) by mouth as needed for migraine. May repeat in 2 hours if needed   valACYclovir 500 MG tablet Commonly known as: VALTREX TAKE 1 TABLET BY MOUTH ONCE DAILY  Allergies:  Allergies  Allergen Reactions  . 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 Estradiol Rash and Other (See Comments)    Local rash with estradiol patches    Past Medical History, Surgical history, Social history, and Family History were reviewed and updated.  Review of Systems: All other 10 point review of systems is negative.   Physical Exam:  vitals were not taken for this visit.   Wt Readings from Last 3 Encounters:  11/18/18 242 lb (109.8 kg)  10/31/18 246 lb 1.3 oz (111.6 kg)  10/16/18 245 lb 1.9 oz (111.2 kg)    Ocular:  Sclerae unicteric, pupils equal, round and reactive to light Ear-nose-throat: Oropharynx clear, dentition fair Lymphatic: No cervical or supraclavicular adenopathy Lungs no rales or rhonchi, good excursion bilaterally Heart regular rate and rhythm, no murmur appreciated Abd soft, nontender, positive bowel sounds, no liver or spleen tip palpated on exam, no fluid wave  MSK no focal spinal tenderness, no joint edema Neuro: non-focal, well-oriented, appropriate affect Breasts: Deferred   Lab Results  Component Value Date   WBC 3.4 (L) 10/16/2018   HGB 10.5 (L) 10/16/2018   HCT 33.1 (L) 10/16/2018   MCV 91.2 10/16/2018   PLT 223 10/16/2018   Lab Results  Component Value Date   FERRITIN 5 (L) 10/16/2018   IRON 129 10/16/2018   TIBC 413 10/16/2018   UIBC 284 10/16/2018   IRONPCTSAT 31 10/16/2018   Lab Results  Component Value Date   RETICCTPCT 1.1 10/16/2018   RBC 3.65 (L) 10/16/2018   RETICCTABS 36,500 10/02/2018   No results found for: KPAFRELGTCHN, LAMBDASER, KAPLAMBRATIO No results found for: IGGSERUM, IGA, IGMSERUM No results found for: Odetta Pink, SPEI   Chemistry      Component Value Date/Time   NA 139 10/16/2018 0903   K 4.3 10/16/2018 0903   CL 105 10/16/2018 0903   CO2 29 10/16/2018 0903   BUN 14 10/16/2018 0903   CREATININE 0.89 10/16/2018 0903   CREATININE 0.88 10/02/2018 1210      Component Value Date/Time   CALCIUM 8.6 (L) 10/16/2018 0903   ALKPHOS 59 10/16/2018 0903   AST 26 10/16/2018 0903   ALT 26 10/16/2018 0903   BILITOT 0.5 10/16/2018 0903       Impression and Plan: Victoria [name] is a very pleasant 58 yo African American female with iron deficiency anemia secondary to malabsorption since laparoscopic gastric bypass in 2016.  We will see what her iron studies show and bring her back in for infusion if needed.  We will go ahead and plan to see her back in another 3 months.  She will contact our  office with any questions or concerns. We can certainly see her sooner if needed.   Laverna Peace, NP 10/1/20208:49 AM

## 2018-11-27 NOTE — Telephone Encounter (Signed)
Called and spoke with patient regarding appointments added per 10/1 los

## 2018-12-01 MED FILL — ESTRADIOL 0.1 MG/GM CRM: 0.1 | 90 days supply | Qty: 43 | Fill #1

## 2018-12-14 MED FILL — HUMIRA PEN 40 MG/0.4ML PNKT: 40 | 28 days supply | Qty: 4 | Fill #2

## 2018-12-23 ENCOUNTER — Other Ambulatory Visit: Payer: Self-pay

## 2018-12-23 ENCOUNTER — Ambulatory Visit: Payer: No Typology Code available for payment source | Attending: Family Medicine | Admitting: Pharmacist

## 2018-12-23 ENCOUNTER — Encounter: Payer: Self-pay | Admitting: Pharmacist

## 2018-12-23 DIAGNOSIS — Z79899 Other long term (current) drug therapy: Secondary | ICD-10-CM

## 2018-12-23 NOTE — Progress Notes (Signed)
   S: Patient presents for review of their specialty medication therapy.  Patient is currently taking Humira for hidradenitis suppurativa. Patient is managed by [facility]) for this.   Adherence: confirms - reports stopping initially with onset of covid-19 but has since resumed. Dr. Ouida Sills is aware of this per patient.   Efficacy: reports good control with Humira  Dosing:  Maintenance: 40 mg every week   Screening: TB test: completed per patient Hepatitis: completed per patient  Monitoring: S/sx of infection: denies CBC: see below, reports that she has history of low WBC and has been evaluated by heme and told that this was just normal for her. S/sx of hypersensitivity: denies  S/sx of malignancy: denies S/sx of heart failure: denies  O:   Lab Results  Component Value Date   WBC 3.6 (L) 11/27/2018   HGB 12.0 11/27/2018   HCT 37.2 11/27/2018   MCV 95.4 11/27/2018   PLT 216 11/27/2018      Chemistry      Component Value Date/Time   NA 139 10/16/2018 0903   K 4.3 10/16/2018 0903   CL 105 10/16/2018 0903   CO2 29 10/16/2018 0903   BUN 14 10/16/2018 0903   CREATININE 0.89 10/16/2018 0903   CREATININE 0.88 10/02/2018 1210      Component Value Date/Time   CALCIUM 8.6 (L) 10/16/2018 0903   ALKPHOS 59 10/16/2018 0903   AST 26 10/16/2018 0903   ALT 26 10/16/2018 0903   BILITOT 0.5 10/16/2018 0903      A/P: 1. Medication review: Patient currently on Humira for hidradenitis suppurativa. Reviewed the medication with the patient, including the following: Humira is a TNF blocking agent indicated for ankylosing spondylitis, Crohn's disease, Hidradenitis suppurativa, psoriatic arthritis, plaque psoriasis, ulcerative colitis, and uveitis. Patient educated on purpose, proper use and potential adverse effects of Humira. The most common adverse effects are infections, headache, and injection site reactions. There is the possibility of an increased risk of  malignancy but it is not well understood if this increased risk is due to there medication or the disease state. There are rare cases of pancytopenia and aplastic anemia. No recommendations for any changes at this time.   Benard Halsted, PharmD, [place] [phone]

## 2018-12-25 ENCOUNTER — Other Ambulatory Visit: Payer: Self-pay

## 2018-12-25 NOTE — Progress Notes (Signed)
GYNECOLOGY  VISIT  CC:   Patient states that she is still having a great amount of discharge. Per patient, wears a thin pad for discharge.  HPI: 58 y.o. G2P0112 Married [place] female here for vaginal recheck.  She continues to have vaginal discharge that she wears a light day pad.  She needs this every day.  She has been tested for BV and yeast multiple times.  Only once was BV positive.  Treatment did not improve the symptoms.  She has been tested for GC/Chl and this was negative.  She has really minimal risk for this anyway.  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  She saw general surgery and was ultimately started on Humira for inflammatory bowel disease and this has resolved the issue.  She had a hysterectomy in 2004.  Recent pap smear was negative.    She felt this really started with menopause.  She is using topical estradiol gel as oral estradiol does not seem to absorpb well in her due to her gastric bypass surgery.  She's using vagifem and estradiol cream as well.  None of this seems to have helpd.  She's still working in GI.  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  She's doing the Covid testing for pt's pre-procedure.  She's really frustrated with work and finds this very stressful.  She's not sure this is a good fit for her.  All of the patients are coming back in the office.    We discussed possible referral to gyn at academic center for second opinion as I just don't know what else to test.  GYNECOLOGIC HISTORY: No LMP recorded. Patient has had a hysterectomy. Contraception: Hysterectomy Menopausal hormone therapy: Estradiol cream  Patient Active Problem List   Diagnosis Date Noted  . 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 IDA (iron deficiency anemia) 10/16/2018  . OA (osteoarthritis) of knee 01/20/2018  . Acute meniscal tear, medial 07/17/2017   . 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 Insomnia 11/08/2016  . Pernicious anemia 11/08/2016  . Leukopenia 07/09/2016  . Hypocupremia 07/09/2016  . Palpitations 04/12/2016  . 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 Lap gastric bypass May 2016 07/06/2014  . History of Clostridium difficile infection 08/12/2011  . Postoperative hypothyroidism 08/26/2010  . Gastroesophageal reflux disease 08/26/2010  . Vitamin D deficiency 08/26/2010  . Fibrocystic disease of breast 08/26/2010  . Cobalamin deficiency 08/26/2010  . Obesity 03/20/2010  . Backache 03/20/2010    Past Medical History:  Diagnosis Date  . Chronic leukopenia followed by pcp (previously followed by hematology-- dr Alvy Bimler)   intermittant since 2010  . Degenerative joint disease of low back 09/02/15    Dr. Maureen Ralphs  . Fibrocystic breast   . GERD (gastroesophageal reflux disease)   . Headache    per pt more stress/tension  . Heart palpitations    SEE EPIC ENCOUTNER , CARDIOLOGY DR. Tressia Miners TURNER 2018; reports on 05-16-17 " i haven't had any bouts of those lately"    . Hemorrhoids   . History of Clostridium difficile infection 12/2010  . History of exercise intolerance    ETT on 04-27-2016-- negative ([facility] treadmill score 7)  . History of Helicobacter pylori infection 2001 and 09/ 2012  . HSV-2 infection    genital  . Hx of  adenomatous colonic polyps 10/17/2007  . Hydradenitis    per pt currently treated with Humira injection  . Hypothyroidism, postsurgical 1980  . Osteoarthritis   . Pernicious anemia    b12 def  . PONV (postoperative nausea and vomiting)   . 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 Varicose veins   . Vitamin B 12 deficiency   . Vitamin D deficiency     Past Surgical History:  Procedure Laterality Date  . BREATH  TEK H PYLORI N/A 05/03/2014   Procedure: BREATH TEK H PYLORI;  Surgeon: Kaylyn Lim, MD;  Location: [facility];  Service: General;  Laterality: N/A;  . CARDIOVASCULAR STRESS TEST  03/11/2013   dr Tressia Miners turner   Low risk nuclear study w/ a very small anterior perfusion defect that most likely represents breast attenuation artifact, less likely this could represent a true perfusion abnormality in the distribution of diagonal branch of the LAD/  normal LV function and wall motion , ef 66%  . CATARACT EXTRACTION W/ INTRAOCULAR LENS  IMPLANT, BILATERAL  10/2017  . CESAREAN SECTION  1985  . CHOLECYSTECTOMY OPEN  1987  . COLONOSCOPY  02/2017   Dr [name] ; per patient they found 7 polyps with polypectomy; on 5 year track   . FINE NEEDLE ASPIRATION Left 05/22/2017   Procedure: LEFT KNEE ASPIRATION;  Surgeon: Gaynelle Arabian, MD;  Location: [facility];  Service: Orthopedics;  Laterality: Left;  Marland Kitchen GASTRIC ROUX-EN-Y N/A 07/06/2014   Procedure: LAPAROSCOPIC ROUX-EN-Y GASTRIC BYPASS, ENTEROLYSIS OF ADHESIONS WITH UPPER ENDOSCOPY;  Surgeon: Johnathan Hausen, MD;  Location: [facility];  Service: General;  Laterality: N/A;  . HAMMER TOE SURGERY Bilateral 02/17/2008   bilateral foot digit's 2 and 5  . HIATAL HERNIA REPAIR N/A 07/06/2014   Procedure: LAPAROSCOPIC REPAIR OF HIATAL HERNIA;  Surgeon: Johnathan Hausen, MD;  Location: [facility];  Service: General;  Laterality: N/A;  . KNEE ARTHROSCOPY WITH MEDIAL MENISECTOMY Left 07/17/2017   Procedure: LEFT KNEE ARTHROSCOPY WITH MEDIAL LATERAL MENISECTOMY;  Surgeon: Gaynelle Arabian, MD;  Location: [facility];  Service: Orthopedics;  Laterality: Left;  Marland Kitchen MASS EXCISION N/A 10/11/2016   Procedure: EXCISION OF PERIANAL MASS;  Surgeon: [name], MD;  Location: [facility];  Service: General;  Laterality: N/A;  . REFRACTIVE SURGERY    . THYROIDECTOMY  1980  . TOTAL ABDOMINAL HYSTERECTOMY  08-25-2002   dr Margaretha Glassing   ovaries retained  . TOTAL HIP ARTHROPLASTY Right 05/21/2012    Procedure: TOTAL HIP ARTHROPLASTY;  Surgeon: Gearlean Alf, MD;  Location: [facility];  Service: Orthopedics;  Laterality: Right;  . TOTAL HIP ARTHROPLASTY Left 01-31-2009  dr Wynelle Link  . TOTAL HIP REVISION Left 05/22/2017   Procedure: Left hip bearing surface and head revision;  Surgeon: Gaynelle Arabian, MD;  Location: [facility];  Service: Orthopedics;  Laterality: Left;  . TOTAL KNEE ARTHROPLASTY Left 01/20/2018   Procedure: LEFT TOTAL KNEE ARTHROPLASTY;  Surgeon: Gaynelle Arabian, MD;  Location: [facility];  Service: Orthopedics;  Laterality: Left;  26min  . TRANSTHORACIC ECHOCARDIOGRAM  05/03/2016   ef 55-60%/  trivial MR/  mild TR    MEDS:   Current Outpatient Medications on File Prior to Visit  Medication Sig Dispense Refill  . acetaminophen (TYLENOL) 500 MG tablet Take 1,000 mg by mouth every 6 (six) hours as needed (for pain).    . Adalimumab (HUMIRA PEN) 40 MG/0.4ML PNKT Inject 1 pen into  the skin once a week. 4 each 6  . ALPRAZolam (XANAX) 1 MG tablet Take 1 tablet (1 mg total) by mouth at bedtime. 30 tablet 2  . CALCIUM PO Take by mouth. Few times a week    . cyanocobalamin (,VITAMIN B-12,) 1000 MCG/ML injection INJECT 1 ML INTRAMUSCULARLY EVERY MONTH 25 mL 0  . cyclobenzaprine (FLEXERIL) 10 MG tablet TAKE 1 TABLET BY MOUTH AT BEDTIME 90 tablet 3  . DIVIGEL 1 MG/GM GEL     . estradiol (ESTRACE) 0.1 MG/GM vaginal cream 1 gram vaginally twice weekly 42.5 g 1  . gabapentin (NEURONTIN) 600 MG tablet Take 1 tablet (600 mg total) by mouth 3 (three) times daily. 90 tablet 2  . HYDROcodone-acetaminophen (NORCO) 10-325 MG tablet Take 1 tablet by mouth every 6 (six) hours as needed. 20 tablet 0  . levothyroxine (SYNTHROID) 112 MCG tablet Take 1 tablet (112 mcg total) by mouth daily. 90 tablet 0  . Multiple Vitamin (MULTIVITAMIN PO) Take by mouth.    . pantoprazole (PROTONIX) 40 MG tablet TAKE 1 TABLET BY MOUTH TWICE DAILY--- PRN    . polyethylene glycol (MIRALAX / GLYCOLAX) packet Take 17 g by mouth  daily as needed for mild constipation.    . promethazine (PHENERGAN) 25 MG tablet TAKE 1 TABLET BY MOUTH EVERY 8 HOURS AS NEEDED FOR NAUSEA AND VOMITING 30 tablet 11  . rizatriptan (MAXALT) 10 MG tablet Take 1 tablet (10 mg total) by mouth as needed for migraine. May repeat in 2 hours if needed 10 tablet 1  . valACYclovir (VALTREX) 500 MG tablet TAKE 1 TABLET BY MOUTH ONCE DAILY 90 tablet 3   No current facility-administered medications on file prior to visit.     ALLERGIES: Other and Estradiol  Family History  Problem Relation Age of Onset  . Diabetes Father   . Diabetes Sister   . Hypertension Sister   . Hypertension Mother     SH:  Married, non smoker  Review of Systems  Constitutional: Negative.   HENT: Negative.   Eyes: Negative.   Respiratory: Negative.   Cardiovascular: Negative.   Gastrointestinal: Negative.   Endocrine: Negative.   Genitourinary: Negative.   Musculoskeletal: Negative.   Skin: Negative.   Allergic/Immunologic: Negative.   Neurological: Negative.   Hematological: Negative.   Psychiatric/Behavioral: Negative.     PHYSICAL EXAMINATION:    BP 116/62 (BP Location: Left Arm, Patient Position: Sitting, Cuff Size: Large)   Pulse 76   Temp (!) 96.4 F (35.8 C) (Temporal)   Resp 12   Wt 258 lb (117 kg)   BMI 40.41 kg/m     General appearance: alert, cooperative and appears stated age Abdomen: soft, non-tender; bowel sounds normal; no masses,  no organomegaly Lymph:  no inguinal LAD noted  Pelvic: External genitalia:  no lesions              Urethra:  normal appearing urethra with no masses, tenderness or lesions              Bartholins and Skenes: normal                 Vagina: normal appearing vagina with normal color and discharge, no lesions              Cervix: absent              Bimanual Exam:  Uterus:  uterus absent              Adnexa:  no mass, fullness, tenderness  Chaperone was present for exam.  Assessment: Persistent vaginal  discharge  Plan: She will consider referral to academic center and let me know.  She wants to check insurance benefits first.     ~20 minutes spent with patient >50% of time was in face to face discussion of above.

## 2018-12-26 ENCOUNTER — Encounter: Payer: Self-pay | Admitting: Obstetrics & Gynecology

## 2018-12-26 ENCOUNTER — Ambulatory Visit (INDEPENDENT_AMBULATORY_CARE_PROVIDER_SITE_OTHER): Payer: No Typology Code available for payment source | Admitting: Obstetrics & Gynecology

## 2018-12-26 VITALS — BP 116/62 | HR 76 | Temp 96.4°F | Resp 12 | Wt 258.0 lb

## 2018-12-26 DIAGNOSIS — N898 Other specified noninflammatory disorders of vagina: Secondary | ICD-10-CM

## 2018-12-26 NOTE — Patient Instructions (Signed)
Please let me know if/when you want a referral for a second opinion.

## 2018-12-29 ENCOUNTER — Other Ambulatory Visit: Payer: Self-pay | Admitting: Internal Medicine

## 2018-12-29 MED FILL — CYANOCOBALAMIN 1,000 MCG/ML: 1000 | 84 days supply | Qty: 3 | Fill #3

## 2018-12-29 MED FILL — LEVOTHYROXINE 112 MCG TAB: 112 | 90 days supply | Qty: 90 | Fill #0

## 2018-12-30 ENCOUNTER — Other Ambulatory Visit: Payer: Self-pay | Admitting: Internal Medicine

## 2018-12-30 MED FILL — GABAPENTIN 600 MG TABLET: 600 | 30 days supply | Qty: 90 | Fill #0

## 2018-12-30 MED FILL — ALPRAZolam 1 MG TABS: 1 | 30 days supply | Qty: 30 | Fill #0

## 2019-01-01 MED FILL — RIZATRIPTAN BENZOATE 10 MG: 10 | 17 days supply | Qty: 10 | Fill #1

## 2019-01-08 ENCOUNTER — Ambulatory Visit: Payer: No Typology Code available for payment source | Admitting: Neurology

## 2019-01-08 ENCOUNTER — Other Ambulatory Visit: Payer: Self-pay

## 2019-01-08 ENCOUNTER — Encounter: Payer: Self-pay | Admitting: Neurology

## 2019-01-08 VITALS — BP 105/73 | HR 69 | Temp 97.5°F | Ht 67.5 in | Wt 258.0 lb

## 2019-01-08 DIAGNOSIS — M5410 Radiculopathy, site unspecified: Secondary | ICD-10-CM

## 2019-01-08 DIAGNOSIS — G4484 Primary exertional headache: Secondary | ICD-10-CM | POA: Diagnosis not present

## 2019-01-08 DIAGNOSIS — H539 Unspecified visual disturbance: Secondary | ICD-10-CM

## 2019-01-08 DIAGNOSIS — R202 Paresthesia of skin: Secondary | ICD-10-CM | POA: Diagnosis not present

## 2019-01-08 DIAGNOSIS — G441 Vascular headache, not elsewhere classified: Secondary | ICD-10-CM

## 2019-01-08 DIAGNOSIS — G935 Compression of brain: Secondary | ICD-10-CM

## 2019-01-08 DIAGNOSIS — G950 Syringomyelia and syringobulbia: Secondary | ICD-10-CM

## 2019-01-08 DIAGNOSIS — G8929 Other chronic pain: Secondary | ICD-10-CM

## 2019-01-08 DIAGNOSIS — M542 Cervicalgia: Secondary | ICD-10-CM

## 2019-01-08 DIAGNOSIS — G43009 Migraine without aura, not intractable, without status migrainosus: Secondary | ICD-10-CM | POA: Diagnosis not present

## 2019-01-08 DIAGNOSIS — R51 Headache with orthostatic component, not elsewhere classified: Secondary | ICD-10-CM | POA: Diagnosis not present

## 2019-01-08 MED ORDER — NURTEC 75 MG PO TBDP: 75.0000 mg | ORAL_TABLET | Freq: Every day | ORAL | 11 refills | Status: DC | PRN

## 2019-01-08 MED ORDER — NARATRIPTAN HCL 2.5 MG PO TABS: 2.5000 mg | ORAL_TABLET | ORAL | 11 refills | Status: DC | PRN

## 2019-01-08 MED FILL — NARATRIPTAN HCL 2.5 MG TAB: 2.5 | 30 days supply | Qty: 10 | Fill #0

## 2019-01-08 NOTE — Patient Instructions (Addendum)
Maxalt take the headache away but it rebounds back.Discussed trying a longer-lasting triptan (naratriptan) and/or taking an alleve with the triptan and/or also the new medications Nurtec/Ubrelvy.   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 Will schedule in the meantime try wrist splints and conservative measures - CTS vs coming from the neck? If in the meantime the splints help, cancel emg/ncs  MRI brain and cervical spine - will call to schedule   Electromyoneurogram Electromyoneurogram is a test to check how well your muscles and nerves are working. This procedure includes the combined use of electromyogram (EMG) and nerve conduction study (NCS). EMG is used to look for muscular disorders. NCS, which is also called electroneurogram, measures how well your nerves are controlling your muscles. The procedures are usually done together to check if your muscles and nerves are healthy. If the results of the tests are abnormal, this may indicate disease or injury, such as a neuromuscular disease or peripheral nerve damage. Tell a health care provider about:  Any allergies you have.  All medicines you are taking, including vitamins, herbs, eye drops, creams, and over-the-counter medicines.  Any problems you or family members have had with anesthetic medicines.  Any blood disorders you have.  Any surgeries you have had.  Any medical conditions you have.  If you have a pacemaker.  Whether you are pregnant or may be pregnant. What are the risks? Generally, this is a safe procedure. However, problems may occur, including:  Infection where the electrodes were inserted.  Bleeding. What happens before the procedure? Medicines Ask your health care provider about:  Changing or stopping your regular medicines. This is especially important if you are taking diabetes medicines or blood  thinners.  Taking medicines such as aspirin and ibuprofen. These medicines can thin your blood. Do not take these medicines unless your health care provider tells you to take them.  Taking over-the-counter medicines, vitamins, herbs, and supplements. General instructions  Your health care provider may ask you to avoid: ? Beverages that have caffeine, such as coffee and tea. ? Any products that contain nicotine or tobacco. These products include cigarettes, e-cigarettes, and chewing tobacco. If you need help quitting, ask your health care provider.  Do not use lotions or creams on the same day that you will be having the procedure. What happens during the procedure? For EMG   Your health care provider will ask you to stay in a position so that he or she can access the muscle that will be studied. You may be standing, sitting, or lying down.  You may be given a medicine that numbs the area (local anesthetic).  A very thin needle that has an electrode will be inserted into your muscle.  Another small electrode will be placed on your skin near the muscle.  Your health care provider will ask you to continue to remain still.  The electrodes will send a signal that tells about the electrical activity of your muscles. You may see this on a monitor or hear it in the room.  After your muscles have been studied at rest, your health care provider will ask you to contract or flex your muscles. The electrodes will send a signal that tells about the electrical activity of your muscles.  Your health care provider will remove the electrodes and the electrode needles when the procedure is finished. The procedure may vary among health  care providers and hospitals. For NCS   An electrode that records your nerve activity (recording electrode) will be placed on your skin by the muscle that is being studied.  An electrode that is used as a reference (reference electrode) will be placed near the  recording electrode.  A paste or gel will be applied to your skin between the recording electrode and the reference electrode.  Your nerve will be stimulated with a mild shock. Your health care provider will measure how much time it takes for your muscle to react.  Your health care provider will remove the electrodes and the gel when the procedure is finished. The procedure may vary among health care providers and hospitals. What happens after the procedure?  It is up to you to get the results of your procedure. Ask your health care provider, or the department that is doing the procedure, when your results will be ready.  Your health care provider may: ? Give you medicines for any pain. ? Monitor the insertion sites to make sure that bleeding stops. Summary  Electromyoneurogram is a test to check how well your muscles and nerves are working.  If the results of the tests are abnormal, this may indicate disease or injury.  This is a safe procedure. However, problems may occur, such as bleeding and infection.  Your health care provider will do two tests to complete this procedure. One checks your muscles (EMG) and another checks your nerves (NCS).  It is up to you to get the results of your procedure. Ask your health care provider, or the department that is doing the procedure, when your results will be ready. This information is not intended to replace advice given to you by your health care provider. Make sure you discuss any questions you have with your health care provider. Document Released: 06/15/2004 Document Revised: 10/29/2017 Document Reviewed: 10/11/2017 Elsevier Patient Education  [place].   Chiari Malformation  Chiari malformation (CM) is a type of brain abnormality that affects the parts of the brain called the cerebellum and the brain stem. The cerebellum is important for balance and the brain stem is important for basic body functions, such as breathing and  swallowing. Normally, the cerebellum is located in a space at the back of the skull, just above the opening in the skull (foramen magnum)where the spinal cord meets the brain stem. With CM, part of the cerebellum is located below the foramen magnum instead. The malformation can be mild with no or few symptoms, or it can be severe. CM can cause neck pain, headaches, balance problems, and other symptoms. What are the causes? CM is a condition that a person is born with (congenital). In rare cases, CM may also develop later in life (acquired CM or secondary CM). These cases may be caused by a leak of the fluid (cerebrospinal fluid) around the brain and spinal cord, leading to low pressure. In acquired or secondary CM, abnormal pressure develops in the brain. This pushes the cerebellum down into the foramen magnum. What increases the risk? The following factors may make you more likely to develop this condition:  Being female.  Having a family history of CM. What are the signs or symptoms? Symptoms of this condition may vary depending on the severity of your CM. In some cases, you may not have any symptoms. In other cases, symptoms may come and go. The most common symptom is a severe headache in the back of the head. The headache:  May come and go.  May spread to your neck and shoulders.  May be worse when you cough, sneeze, or strain. Other symptoms include:  Difficulty balancing.  Loss of coordination.  Trouble swallowing or speaking.  Muscle weakness.  Feeling dizzy.  Ringing in the ears.  Fainting.  Trouble sleeping.  Fatigue.  Tingling or burning sensations in the fingers or toes.  Hearing problems.  Vision problems.  Vomiting.  Depression.  Seizures, in severe cases. How is this diagnosed? This condition may be evaluated with a medical history and physical exam. This may include tests to check your balance and nerves (neurological exam). You may also have imaging  tests, such as CT scan or MRI. How is this treated? Treatment for this condition depends on the severity of your symptoms. You may be treated with:  Surgery to prevent the malformation from getting worse, or to treat severe symptoms or symptoms that are getting worse.  Medicines or alternative treatments to relieve headaches or neck pain. If you do not have symptoms, you may not need treatment. Follow these instructions at home: If you have seizures:  Do not drive, swim, or do any other activities that would be dangerous if you had a seizure. Wait until your health care provider says it is safe to do them.  Avoid any substances that may prevent your medicine from working properly, such as alcohol.  Check with your local Department of Motor Vehicles [facility]) to find out about local driving laws. Each state has specific rules about when you can legally return to driving.  Get enough rest. Lack of sleep can make seizures more likely to occur. Medicines  Take over-the-counter and prescription medicines only as told by your health care provider.  Do not drive or use heavy machinery while taking prescription pain medicine.  If you are taking blood pressure or heart medicine, get up slowly and take several minutes to sit and then stand. This can reduce dizziness. General instructions  If you feel like you might faint: ? Lie down right away and raise (elevate) your feet above the level of your heart. ? Breathe deeply and steadily. Wait until all of the symptoms have passed.  Ask your health care provider which activities are safe for you, and if you have any activity restrictions.  Do not use any products that contain nicotine or tobacco, such as cigarettes and e-cigarettes. If you need help quitting, ask your health care provider.  Drink enough fluid to keep your urine pale yellow.  Consider joining a CM support group.  Keep all follow-up visits as told by your health care provider. This  is important. Contact a health care provider if:  You have new symptoms.  Your symptoms get worse. Get help right away if:  You have seizures that are new or different from other seizures that you have had.  You develop weakness or numbness in one or all of your limbs.  You develop dizziness, slurred speech, double vision, weakness, or numbness with a severe headache. Summary  A Chiari malformation is a condition in which part of the cerebellum moves through the foramen magnum.  The malformation can be mild with no symptoms, or it can be severe.  In some patients, no treatment is needed. In others, medicines are used to treat headaches. Surgery is done in the worst of cases. This information is not intended to replace advice given to you by your health care provider. Make sure you discuss any questions you have  with your health care provider. Document Released: 02/02/2002 Document Revised: 02/14/2017 Document Reviewed: 11/21/2016 Elsevier Patient Education  2020 [place] tunnel syndrome is a condition that causes pain in your hand and arm. The carpal tunnel is a narrow area that is on the palm side of your wrist. Repeated wrist motion or certain diseases may cause swelling in the tunnel. This swelling can pinch the main nerve in the wrist (median nerve). What are the causes? This condition may be caused by:  Repeated wrist motions.  Wrist injuries.  Arthritis.  A sac of fluid (cyst) or abnormal growth (tumor) in the carpal tunnel.  Fluid buildup during pregnancy. Sometimes the cause is not known. What increases the risk? The following factors may make you more likely to develop this condition:  Having a job in which you move your wrist in the same way many times. This includes jobs like being a Software engineer or a Scientist, water quality.  Being a woman.  Having other health conditions, such as: ? Diabetes. ? Obesity. ? A thyroid gland that is not  active enough (hypothyroidism). ? Kidney failure. What are the signs or symptoms? Symptoms of this condition include:  A tingling feeling in your fingers.  Tingling or a loss of feeling (numbness) in your hand.  Pain in your entire arm. This pain may get worse when you bend your wrist and elbow for a long time.  Pain in your wrist that goes up your arm to your shoulder.  Pain that goes down into your palm or fingers.  A weak feeling in your hands. You may find it hard to grab and hold items. You may feel worse at night. How is this diagnosed? This condition is diagnosed with a medical history and physical exam. You may also have tests, such as:  Electromyogram (EMG). This test checks the signals that the nerves send to the muscles.  Nerve conduction study. This test checks how well signals pass through your nerves.  Imaging tests, such as X-rays, ultrasound, and MRI. These tests check for what might be the cause of your condition. How is this treated? This condition may be treated with:  Lifestyle changes. You will be asked to stop or change the activity that caused your problem.  Doing exercise and activities that make bones and muscles stronger (physical therapy).  Learning how to use your hand again (occupational therapy).  Medicines for pain and swelling (inflammation). You may have injections in your wrist.  A wrist splint.  Surgery. Follow these instructions at home: If you have a splint:  Wear the splint as told by your doctor. Remove it only as told by your doctor.  Loosen the splint if your fingers: ? Tingle. ? Lose feeling (become numb). ? Turn cold and blue.  Keep the splint clean.  If the splint is not waterproof: ? Do not let it get wet. ? Cover it with a watertight covering when you take a bath or a shower. Managing pain, stiffness, and swelling   If told, put ice on the painful area: ? If you have a removable splint, remove it as told by your  doctor. ? Put ice in a plastic bag. ? Place a towel between your skin and the bag. ? Leave the ice on for 20 minutes, 2-3 times per day. General instructions  Take over-the-counter and prescription medicines only as told by your doctor.  Rest your wrist from any activity that may cause pain. If needed,  talk with your boss at work about changes that can help your wrist heal.  Do any exercises as told by your doctor, physical therapist, or occupational therapist.  Keep all follow-up visits as told by your doctor. This is important. Contact a doctor if:  You have new symptoms.  Medicine does not help your pain.  Your symptoms get worse. Get help right away if:  You have very bad numbness or tingling in your wrist or hand. Summary  Carpal tunnel syndrome is a condition that causes pain in your hand and arm.  It is often caused by repeated wrist motions.  Lifestyle changes and medicines are used to treat this problem. Surgery may help in very bad cases.  Follow your doctor's instructions about wearing a splint, resting your wrist, keeping follow-up visits, and calling for help. This information is not intended to replace advice given to you by your health care provider. Make sure you discuss any questions you have with your health care provider. Document Released: 02/01/2011 Document Revised: 06/21/2017 Document Reviewed: 06/21/2017 Elsevier Patient Education  2020 [employer].

## 2019-01-08 NOTE — Progress Notes (Signed)
[id number]    Provider:  Dr Jaynee Eagles Requesting Provider: Renold Genta Cresenciano Lick, MD Primary Care Provider:  Elby Showers, MD  CC:  migraine  HPI:  Victoria [name] is a 58 y.o. female here as requested by Elby Showers, MD for recurrent headache. She has a PMHx of migraine, anemia, B12 deficency, post-gastrectomy syndrome, DDD of low back, chronic leukopenia. She has a hx of migraines started years ago, Dr. Renold Genta gave her maxalt and she also tried imitrex and took it years ago. The migraines are back. The last migraine lasted days. Starts on the left, behind the eye, pulsating/pounding/throbbing, light and sound sensitivity, nausea, she uses phenergan which helps but it makes her sleepy. Even with maxalt and sleep it improves but comes back. It is intermittent. Over the last 10 months she has 5 days of headaches and 2 days of migraines which moderately severe. Sleeping helps. Movement makes it worse. But acute management not working. No worsening headaches with exertion, bearing down, or coughing. She is seeing floaters this just started. She has an ophthalmologist. She is having trouble turning her neck and tingling in her fingers. Neck pain. It is in her neck and tingling in the fingers. Numbness and tinglngi in the fingers, left > right. In the middle of the night wakes up and during the day.   Reviewed notes, labs and imaging from outside physicians, which showed:  Reviewed images of the brain, 04/2017: agree with following: Equivocal findings for idiopathic intracranial hypertension as a cause of headaches. Partial empty sella, slight tonsillar ectopia, and prominent CSF surrounding the optic nerves. No visible venous sinus stenosis or thrombosis.  No acute intracranial abnormality and no evidence of significant white matter disease.    Review of Systems: Patient complains of symptoms per HPI as well as the following symptoms: migraine, anemia. Pertinent negatives and  positives per HPI. All others negative.   Social History   Socioeconomic History  . Marital status: Married    Spouse name: Not on file  . Number of children: 2  . Years of education: Not on file  . Highest education level: Not on file  Occupational History  . Occupation: nurse  Social Needs  . Financial resource strain: Not on file  . Food insecurity    Worry: Not on file    Inability: Not on file  . Transportation needs    Medical: Not on file    Non-medical: Not on file  Tobacco Use  . Smoking status: Never Smoker  . Smokeless tobacco: Never Used  Substance and Sexual Activity  . Alcohol use: No  . Drug use: No  . Sexual activity: Not Currently    Partners: Male    Birth control/protection: Surgical  Lifestyle  . Physical activity    Days per week: Not on file    Minutes per session: Not on file  . Stress: Not on file  Relationships  . Social Herbalist on phone: Not on file    Gets together: Not on file    Attends religious service: Not on file    Active member of club or organization: Not on file    Attends meetings of clubs or organizations: Not on file    Relationship status: Not on file  . Intimate partner violence    Fear of current or ex partner: Not on file    Emotionally abused: Not on file    Physically abused: Not on file  Forced sexual activity: Not on file  Other Topics Concern  . Not on file  Social History Narrative   Lives at home with spouse   Right handed   Caffeine: "not really"    Family History  Problem Relation Age of Onset  . Diabetes Father   . Diabetes Sister   . Hypertension Sister   . Hypertension Mother   . Headache Mother     Past Medical History:  Diagnosis Date  . Chronic leukopenia followed by pcp (previously followed by hematology-- dr Alvy Bimler)   intermittant since 2010  . Degenerative joint disease of low back 09/02/15    Dr. Maureen Ralphs  . Fibrocystic breast   . GERD (gastroesophageal reflux disease)    . Headache    per pt more stress/tension  . Heart palpitations    SEE EPIC ENCOUTNER , CARDIOLOGY DR. Tressia Miners TURNER 2018; reports on 05-16-17 " i haven't had any bouts of those lately"    . Hemorrhoids   . History of Clostridium difficile infection 12/2010  . History of exercise intolerance    ETT on 04-27-2016-- negative ([facility] treadmill score 7)  . History of Helicobacter pylori infection 2001 and 09/ 2012  . HSV-2 infection    genital  . Hx of adenomatous colonic polyps 10/17/2007  . Hydradenitis    per pt currently treated with Humira injection  . Hypothyroidism, postsurgical 1980  . Osteoarthritis   . Pernicious anemia    b12 def  . PONV (postoperative nausea and vomiting)   . 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 Varicose veins   . Vitamin B 12 deficiency   . Vitamin D deficiency     Patient Active Problem List   Diagnosis Date Noted  . 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 IDA (iron deficiency anemia) 10/16/2018  . OA (osteoarthritis) of knee 01/20/2018  . Acute meniscal tear, medial 07/17/2017  . 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 Insomnia 11/08/2016  . Pernicious anemia 11/08/2016  . Leukopenia 07/09/2016  . Hypocupremia 07/09/2016  . Palpitations 04/12/2016  . 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 Lap gastric bypass May 2016 07/06/2014  . History of Clostridium difficile infection 08/12/2011  . Postoperative hypothyroidism 08/26/2010  . Gastroesophageal reflux disease 08/26/2010  . Vitamin D deficiency 08/26/2010  . Fibrocystic disease of breast 08/26/2010  . Cobalamin deficiency 08/26/2010  . Obesity 03/20/2010  . Backache 03/20/2010    Past Surgical History:  Procedure Laterality Date  . BREATH TEK H PYLORI N/A 05/03/2014   Procedure: BREATH TEK H PYLORI;  Surgeon:  Kaylyn Lim, MD;  Location: [facility];  Service: General;  Laterality: N/A;  . CARDIOVASCULAR STRESS TEST  03/11/2013   dr Tressia Miners turner   Low risk nuclear study w/ a very small anterior perfusion defect that most likely represents breast attenuation artifact, less likely this could represent a true perfusion abnormality in the distribution of diagonal branch of the LAD/  normal LV function and wall motion , ef 66%  . CATARACT EXTRACTION W/ INTRAOCULAR LENS  IMPLANT, BILATERAL  10/2017  . CESAREAN SECTION  1985  . CHOLECYSTECTOMY OPEN  1987  . COLONOSCOPY  02/2017   Dr [name] ; per patient they found 7 polyps with polypectomy; on 5 year track   . FINE NEEDLE ASPIRATION Left 05/22/2017   Procedure: LEFT KNEE ASPIRATION;  Surgeon: Gaynelle Arabian, MD;  Location: [facility];  Service: Orthopedics;  Laterality: Left;  Marland Kitchen GASTRIC ROUX-EN-Y N/A 07/06/2014   Procedure: LAPAROSCOPIC ROUX-EN-Y GASTRIC BYPASS, ENTEROLYSIS OF ADHESIONS WITH UPPER ENDOSCOPY;  Surgeon: Johnathan Hausen, MD;  Location: [facility];  Service: General;  Laterality: N/A;  . HAMMER TOE SURGERY Bilateral 02/17/2008   bilateral foot digit's 2 and 5  . HIATAL HERNIA REPAIR N/A 07/06/2014   Procedure: LAPAROSCOPIC REPAIR OF HIATAL HERNIA;  Surgeon: Johnathan Hausen, MD;  Location: [facility];  Service: General;  Laterality: N/A;  . KNEE ARTHROSCOPY WITH MEDIAL MENISECTOMY Left 07/17/2017   Procedure: LEFT KNEE ARTHROSCOPY WITH MEDIAL LATERAL MENISECTOMY;  Surgeon: Gaynelle Arabian, MD;  Location: [facility];  Service: Orthopedics;  Laterality: Left;  Marland Kitchen MASS EXCISION N/A 10/11/2016   Procedure: EXCISION OF PERIANAL MASS;  Surgeon: [name], MD;  Location: [facility];  Service: General;  Laterality: N/A;  . REFRACTIVE SURGERY    . THYROIDECTOMY  1980  . TOTAL ABDOMINAL HYSTERECTOMY  08-25-2002   dr Margaretha Glassing   ovaries retained  . TOTAL HIP ARTHROPLASTY Right 05/21/2012   Procedure: TOTAL HIP ARTHROPLASTY;  Surgeon: Gearlean Alf, MD;   Location: [facility];  Service: Orthopedics;  Laterality: Right;  . TOTAL HIP ARTHROPLASTY Left 01-31-2009  dr Wynelle Link  . TOTAL HIP REVISION Left 05/22/2017   Procedure: Left hip bearing surface and head revision;  Surgeon: Gaynelle Arabian, MD;  Location: [facility];  Service: Orthopedics;  Laterality: Left;  . TOTAL KNEE ARTHROPLASTY Left 01/20/2018   Procedure: LEFT TOTAL KNEE ARTHROPLASTY;  Surgeon: Gaynelle Arabian, MD;  Location: [facility];  Service: Orthopedics;  Laterality: Left;  86min  . TRANSTHORACIC ECHOCARDIOGRAM  05/03/2016   ef 55-60%/  trivial MR/  mild TR  . TUBAL LIGATION      Current Outpatient Medications  Medication Sig Dispense Refill  . acetaminophen (TYLENOL) 500 MG tablet Take 1,000 mg by mouth every 6 (six) hours as needed (for pain).    . Adalimumab (HUMIRA PEN) 40 MG/0.4ML PNKT Inject 1 pen into the skin once a week. 4 each 6  . ALPRAZolam (XANAX) 1 MG tablet TAKE 1 TABLET BY MOUTH AT BEDTIME. 30 tablet 2  . CALCIUM PO Take by mouth. Few times a week    . cyanocobalamin (,VITAMIN B-12,) 1000 MCG/ML injection INJECT 1 ML INTRAMUSCULARLY EVERY MONTH 25 mL 0  . cyclobenzaprine (FLEXERIL) 10 MG tablet TAKE 1 TABLET BY MOUTH AT BEDTIME 90 tablet 3  . DIVIGEL 1 MG/GM GEL     . estradiol (ESTRACE) 0.1 MG/GM vaginal cream 1 gram vaginally twice weekly 42.5 g 1  . gabapentin (NEURONTIN) 600 MG tablet TAKE 1 TABLET BY MOUTH 3 TIMES DAILY. 90 tablet 2  . HYDROcodone-acetaminophen (NORCO) 10-325 MG tablet Take 1 tablet by mouth every 6 (six) hours as needed. 20 tablet 0  . levothyroxine (SYNTHROID) 112 MCG tablet TAKE 1 TABLET BY MOUTH ONCE A DAY 90 tablet 2  . Multiple Vitamin (MULTIVITAMIN PO) Take by mouth.    . pantoprazole (PROTONIX) 40 MG tablet TAKE 1 TABLET BY MOUTH TWICE DAILY--- PRN    . polyethylene glycol (MIRALAX / GLYCOLAX) packet Take 17 g by mouth daily as needed for mild constipation.    . promethazine (PHENERGAN) 25 MG tablet TAKE 1 TABLET BY MOUTH EVERY 8 HOURS AS NEEDED  FOR NAUSEA AND VOMITING 30 tablet 11  . rizatriptan (MAXALT) 10 MG tablet Take 1 tablet (10 mg total) by mouth as needed for  migraine. May repeat in 2 hours if needed 10 tablet 1  . valACYclovir (VALTREX) 500 MG tablet TAKE 1 TABLET BY MOUTH ONCE DAILY 90 tablet 3  . naratriptan (AMERGE) 2.5 MG tablet Take 1 tablet (2.5 mg total) by mouth as needed for migraine. If headache returns or does not resolve, may repeat after 2 hours; do not exceed five (5) mg in 24 hours. 10 tablet 11  . Rimegepant Sulfate (NURTEC) 75 MG TBDP Take 75 mg by mouth daily as needed. For migraines. Take as close to onset of migraine as possible. One daily maximum. 10 tablet 11   No current facility-administered medications for this visit.     Allergies as of 01/08/2019 - Review Complete 01/08/2019  Allergen Reaction Noted  . Other Other (See Comments) 02/11/2018  . Estradiol Rash and Other (See Comments) 03/12/2016    Vitals: BP 105/73 (BP Location: Left Arm, Patient Position: Sitting)   Pulse 69   Temp (!) 97.5 F (36.4 C) Comment: taken at front  Ht 5' 7.5" (1.715 m)   Wt 258 lb (117 kg)   BMI 39.81 kg/m  Last Weight:  Wt Readings from Last 1 Encounters:  01/08/19 258 lb (117 kg)   Last Height:   Ht Readings from Last 1 Encounters:  01/08/19 5' 7.5" (1.715 m)     Physical exam: Exam: Gen: NAD, conversant, well nourised, obese, well groomed                     CV: RRR, no MRG. No Carotid Bruits. No peripheral edema, warm, nontender Eyes: Conjunctivae clear without exudates or hemorrhage  Neuro: Detailed Neurologic Exam  Speech:    Speech is normal; fluent and spontaneous with normal comprehension.  Cognition:    The patient is oriented to person, place, and time;     recent and remote memory intact;     language fluent;     normal attention, concentration,     fund of knowledge Cranial Nerves:    The pupils are equal, round, and reactive to light. The fundi are normal and spontaneous venous  pulsations are present. Visual fields are full to finger confrontation. Extraocular movements are intact. Trigeminal sensation is intact and the muscles of mastication are normal. The face is symmetric. The palate elevates in the midline. Hearing intact. Voice is normal. Shoulder shrug is normal. The tongue has normal motion without fasciculations.   Coordination:    Normal finger to nose and heel to shin. Normal rapid alternating movements.   Gait:    Heel-toe and tandem gait are normal.   Motor Observation:    No asymmetry, no atrophy, and no involuntary movements noted. Tone:    Normal muscle tone.    Posture:    Posture is normal. normal erect    Strength:    Strength is V/V in the upper and lower limbs.      Sensation: intact to LT     Reflex Exam:  DTR's:    Deep tendon reflexes in the upper and lower extremities are normal bilaterally.   Toes:    The toes are downgoing bilaterally.   Clonus:    Clonus is absent.    Assessment/Plan:  57 year old with Migraines. However given concerning symptoms need further evaluation as below.   Maxalt take the headache away but it rebounds back.Discussed trying a longer-lasting triptan (almotriptan) and/or taking an alleve with the triptan and/or also the new medications Nurtec/Ubrelvy. Gave nurtec patient instructions/side  effects from UpToDate (not in epic yet)  Chiari with worsening headaches, neck pain, numbness and tingling in the fingers, vision changes need repeat MRI brain as well as MRI cervical spine for worsening chiari as well as syrinx.   Emg/Ncs. Will schedule in the meantime try wrist splints and conservative measures. (Ne Mcphalen's maneuver)  Orders Placed This Encounter  Procedures  . MR BRAIN W WO CONTRAST  . MR CERVICAL SPINE WO CONTRAST  . NCV with EMG(electromyography)   Meds ordered this encounter  Medications  . naratriptan (AMERGE) 2.5 MG tablet    Sig: Take 1 tablet (2.5 mg total) by mouth as needed  for migraine. If headache returns or does not resolve, may repeat after 2 hours; do not exceed five (5) mg in 24 hours.    Dispense:  10 tablet    Refill:  11  . Rimegepant Sulfate (NURTEC) 75 MG TBDP    Sig: Take 75 mg by mouth daily as needed. For migraines. Take as close to onset of migraine as possible. One daily maximum.    Dispense:  10 tablet    Refill:  11    Patient has copay card; she can have medication regardless of insurance approval or copay amount.    Cc: Elby Showers, MD,  Quentin Ore with [facility] (please eval floaters and for papilledema)  Sarina Ill, MD  [facility] [address] [place], [place] [address]  Phone [phone] Fax [phone]

## 2019-01-11 ENCOUNTER — Encounter: Payer: Self-pay | Admitting: Neurology

## 2019-01-13 ENCOUNTER — Telehealth: Payer: Self-pay | Admitting: *Deleted

## 2019-01-13 ENCOUNTER — Encounter: Payer: Self-pay | Admitting: *Deleted

## 2019-01-13 MED FILL — HUMIRA PEN 40 MG/0.4ML PNKT: 40 | 28 days supply | Qty: 4 | Fill #3

## 2019-01-13 MED FILL — NURTEC 75 MG TBDP: 75 | 15 days supply | Qty: 8 | Fill #0

## 2019-01-13 NOTE — Telephone Encounter (Signed)
Completed Nurtec 75 mg PA on CMM. Key: [id number]. Awaiting [employer] determination.

## 2019-01-13 NOTE — Telephone Encounter (Signed)
Per [employer], nurtec has been approved from 01/13/2019 through 07/12/2019. I faxed the approval letter to pt's pharmacy. Received a receipt of confirmation.

## 2019-01-15 ENCOUNTER — Telehealth: Payer: Self-pay | Admitting: Neurology

## 2019-01-15 NOTE — Telephone Encounter (Signed)
Pt returned call to schedule her MRI. Please advise.

## 2019-01-16 ENCOUNTER — Encounter: Payer: Self-pay | Admitting: Internal Medicine

## 2019-01-16 ENCOUNTER — Ambulatory Visit (INDEPENDENT_AMBULATORY_CARE_PROVIDER_SITE_OTHER): Payer: No Typology Code available for payment source | Admitting: Internal Medicine

## 2019-01-16 ENCOUNTER — Other Ambulatory Visit: Payer: Self-pay

## 2019-01-16 ENCOUNTER — Telehealth: Payer: Self-pay | Admitting: Neurology

## 2019-01-16 VITALS — BP 100/60 | HR 84 | Ht 67.5 in | Wt 244.0 lb

## 2019-01-16 DIAGNOSIS — Z79899 Other long term (current) drug therapy: Secondary | ICD-10-CM | POA: Diagnosis not present

## 2019-01-16 DIAGNOSIS — F411 Generalized anxiety disorder: Secondary | ICD-10-CM

## 2019-01-16 DIAGNOSIS — L732 Hidradenitis suppurativa: Secondary | ICD-10-CM

## 2019-01-16 DIAGNOSIS — Z79620 Long term (current) use of immunosuppressive biologic: Secondary | ICD-10-CM

## 2019-01-16 NOTE — Telephone Encounter (Signed)
Pt has called stating she was returning a call to [facility] from yesterday.  Please call

## 2019-01-19 NOTE — Telephone Encounter (Signed)
I called to schedule the patient but they did not answer so I left a VM asking them to call me back.  ° °

## 2019-01-20 NOTE — Telephone Encounter (Signed)
Patient returned missed call to schedule MRI. 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

## 2019-01-23 NOTE — Progress Notes (Signed)
   Subjective:    Patient ID: Victoria [name], female    DOB: June 01, 1960, 58 y.o.   MRN: [id number]  HPI 59 year old Female with history of hidradenitis suppurativa treated by Dermatologist with Humira injection weekly. 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 Some patients have apparently tested positive after interviewing with nursing staff in advance of procedure. This concerns her greatly. Even though she is wearing PPE, she is still concerned about a positive Covid-19 exposure. She is able to work with doctors during the procedure. She feels safer in that venue as [atients have all tested negative. She would like note to be excused from Pre-procedure evaluations.    Review of Systems see above also undergoing evaluation for headache by Dr. Jaynee Eagles. Headaches have improved     Objective:   Physical Exam  See vital signs      Assessment & Plan:  Humira for hidradenitis suppurativa  Anxiety state due to fear of contracting Covid-19 while on DMARD therapy  Hidradentitis was extremely severe before being placed on Humira.  Plan: letter written asking for adjustment in work duties due to DMARD therapy

## 2019-01-23 NOTE — Patient Instructions (Signed)
Letter written as requested for adjustment in work duties due to DMARD therapy

## 2019-01-26 MED FILL — NURTEC 75 MG TBDP: 75 | 15 days supply | Qty: 8 | Fill #1

## 2019-01-26 NOTE — Telephone Encounter (Signed)
I spoke with the patient and scheduled her apt. DW

## 2019-01-28 ENCOUNTER — Other Ambulatory Visit: Payer: Self-pay | Admitting: Neurology

## 2019-01-28 ENCOUNTER — Ambulatory Visit: Payer: No Typology Code available for payment source

## 2019-01-28 ENCOUNTER — Other Ambulatory Visit: Payer: Self-pay

## 2019-01-28 DIAGNOSIS — G8929 Other chronic pain: Secondary | ICD-10-CM

## 2019-01-28 DIAGNOSIS — R51 Headache with orthostatic component, not elsewhere classified: Secondary | ICD-10-CM

## 2019-01-28 DIAGNOSIS — G950 Syringomyelia and syringobulbia: Secondary | ICD-10-CM

## 2019-01-28 DIAGNOSIS — G441 Vascular headache, not elsewhere classified: Secondary | ICD-10-CM

## 2019-01-28 DIAGNOSIS — G935 Compression of brain: Secondary | ICD-10-CM

## 2019-01-28 DIAGNOSIS — M5410 Radiculopathy, site unspecified: Secondary | ICD-10-CM

## 2019-01-28 DIAGNOSIS — M542 Cervicalgia: Secondary | ICD-10-CM

## 2019-01-28 DIAGNOSIS — G4484 Primary exertional headache: Secondary | ICD-10-CM

## 2019-01-28 DIAGNOSIS — R202 Paresthesia of skin: Secondary | ICD-10-CM

## 2019-01-28 MED ORDER — GADOBENATE DIMEGLUMINE 529 MG/ML IV SOLN: 20.0000 mL | Freq: Once | INTRAVENOUS | Status: AC | PRN

## 2019-02-06 MED FILL — HUMIRA PEN 40 MG/0.4ML PNKT: 40 | 28 days supply | Qty: 4 | Fill #4

## 2019-03-05 ENCOUNTER — Encounter: Payer: No Typology Code available for payment source | Admitting: Neurology

## 2019-03-06 MED FILL — HUMIRA PEN 40 MG/0.4ML PNKT: 40 | 28 days supply | Qty: 4 | Fill #5

## 2019-03-13 MED FILL — GABAPENTIN 600 MG TABLET: 600 | 30 days supply | Qty: 90 | Fill #1

## 2019-03-13 MED FILL — PROMETHAZINE 25 MG TABLET: 25 | 10 days supply | Qty: 30 | Fill #1

## 2019-03-13 MED FILL — ALPRAZolam 1 MG TABS: 1 | 30 days supply | Qty: 30 | Fill #1

## 2019-03-13 MED FILL — CYCLOBENZAPRINE HCL 10 MG T: 10 | 90 days supply | Qty: 90 | Fill #2

## 2019-03-26 MED FILL — LEVOTHYROXINE SODIUM 112 MC: 112 | 90 days supply | Qty: 90 | Fill #1

## 2019-03-27 ENCOUNTER — Inpatient Hospital Stay: Payer: No Typology Code available for payment source | Attending: Family

## 2019-03-27 ENCOUNTER — Other Ambulatory Visit: Payer: Self-pay

## 2019-03-27 ENCOUNTER — Inpatient Hospital Stay (HOSPITAL_BASED_OUTPATIENT_CLINIC_OR_DEPARTMENT_OTHER): Payer: No Typology Code available for payment source | Admitting: Family

## 2019-03-27 VITALS — BP 105/57 | HR 57 | Temp 97.1°F | Resp 17 | Ht 67.0 in | Wt 262.0 lb

## 2019-03-27 DIAGNOSIS — D508 Other iron deficiency anemias: Secondary | ICD-10-CM

## 2019-03-27 DIAGNOSIS — K909 Intestinal malabsorption, unspecified: Secondary | ICD-10-CM | POA: Diagnosis not present

## 2019-03-27 DIAGNOSIS — D509 Iron deficiency anemia, unspecified: Secondary | ICD-10-CM | POA: Diagnosis present

## 2019-03-27 LAB — CBC WITH DIFFERENTIAL (CANCER CENTER ONLY)
Abs Immature Granulocytes: 0.01 10*3/uL (ref 0.00–0.07)
Basophils Absolute: 0 10*3/uL (ref 0.0–0.1)
Basophils Relative: 0 %
Eosinophils Absolute: 0.1 10*3/uL (ref 0.0–0.5)
Eosinophils Relative: 5 %
HCT: 37.3 % (ref 36.0–46.0)
Hemoglobin: 12.1 g/dL (ref 12.0–15.0)
Immature Granulocytes: 0 %
Lymphocytes Relative: 47 %
Lymphs Abs: 1.3 10*3/uL (ref 0.7–4.0)
MCH: 31.9 pg (ref 26.0–34.0)
MCHC: 32.4 g/dL (ref 30.0–36.0)
MCV: 98.4 fL (ref 80.0–100.0)
Monocytes Absolute: 0.3 10*3/uL (ref 0.1–1.0)
Monocytes Relative: 10 %
Neutro Abs: 1.1 10*3/uL — ABNORMAL LOW (ref 1.7–7.7)
Neutrophils Relative %: 38 %
Platelet Count: 195 10*3/uL (ref 150–400)
RBC: 3.79 MIL/uL — ABNORMAL LOW (ref 3.87–5.11)
RDW: 13.8 % (ref 11.5–15.5)
WBC Count: 2.8 10*3/uL — ABNORMAL LOW (ref 4.0–10.5)
nRBC: 0 % (ref 0.0–0.2)

## 2019-03-27 LAB — RETICULOCYTES
Immature Retic Fract: 12 % (ref 2.3–15.9)
RBC.: 3.64 MIL/uL — ABNORMAL LOW (ref 3.87–5.11)
Retic Count, Absolute: 52.1 10*3/uL (ref 19.0–186.0)
Retic Ct Pct: 1.4 % (ref 0.4–3.1)

## 2019-03-27 LAB — IRON AND TIBC
Iron: 91 ug/dL (ref 41–142)
Saturation Ratios: 33 % (ref 21–57)
TIBC: 274 ug/dL (ref 236–444)
UIBC: 183 ug/dL (ref 120–384)

## 2019-03-27 LAB — FERRITIN: Ferritin: 53 ng/mL (ref 11–307)

## 2019-03-27 NOTE — Progress Notes (Signed)
Hematology and Oncology Follow Up Visit  Victoria [name] [id number] May 16, 1960 59 y.o. 03/27/2019   Principle Diagnosis:  Iron deficiency anemia secondary to malabsorption, s/p gastric bypass in 2016  Current Therapy:   IV iron as indicated    Interim History:  Victoria [name] is here today for follow-up. She is feeling fatigued and not sleeping well. She does take Xanax at night to get a few hours of sleep at a time.  She has had no episodes of bleeding. No bruising or petechiae.  Hgb is stable at 12.1, MCV 98, platelets 195.  She states that she will occasionally have SOB with over exertion.  No fever, chills, n/v, cough, rash, dizziness, chest pain, palpitations, abdominal pain or changes in bowel or bladder habits.  No swelling in her extremities at this time.  She does have chronic numbness and tingling in her fingertips secondary to arthritis in her neck as well as from the left knee down since her surgery.  No falls or syncopal episodes to report. She is still staying quite busy with work.  She states that she sometimes feels too tired to eat and has had some issues with hypoglycemia. She will try making sure to graze and eat when she gets home from work. She also admits that she needs to better hydrate throughout the day. Her weight today is 262 lbs.   ECOG Performance Status: 1 - Symptomatic but completely ambulatory  Medications:  Allergies as of 03/27/2019      Reactions   Other Other (See Comments)   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   Estradiol Rash, Other (See Comments)   Local rash with estradiol patches      Medication List       Accurate as of March 27, 2019  8:51 AM. If you have any questions, ask your nurse or doctor.        acetaminophen 500 MG tablet Commonly known as: TYLENOL Take 1,000 mg by mouth every 6 (six) hours as needed (for pain).   ALPRAZolam 1 MG tablet Commonly known as: XANAX TAKE 1 TABLET BY MOUTH AT  BEDTIME.   CALCIUM PO Take by mouth. Few times a week   cyanocobalamin 1000 MCG/ML injection Commonly known as: (VITAMIN B-12) INJECT 1 ML INTRAMUSCULARLY EVERY MONTH   cyclobenzaprine 10 MG tablet Commonly known as: FLEXERIL TAKE 1 TABLET BY MOUTH AT BEDTIME   Divigel 1 MG/GM Gel Generic drug: Estradiol   estradiol 0.1 MG/GM vaginal cream Commonly known as: ESTRACE 1 gram vaginally twice weekly   gabapentin 600 MG tablet Commonly known as: NEURONTIN TAKE 1 TABLET BY MOUTH 3 TIMES DAILY.   Humira Pen 40 MG/0.4ML Pnkt Generic drug: Adalimumab Inject 1 pen into the skin once a week.   HYDROcodone-acetaminophen 10-325 MG tablet Commonly known as: NORCO Take 1 tablet by mouth every 6 (six) hours as needed.   levothyroxine 112 MCG tablet Commonly known as: SYNTHROID TAKE 1 TABLET BY MOUTH ONCE A DAY   MULTIVITAMIN PO Take by mouth.   Nurtec 75 MG Tbdp Generic drug: Rimegepant Sulfate Take 75 mg by mouth daily as needed. For migraines. Take as close to onset of migraine as possible. One daily maximum.   pantoprazole 40 MG tablet Commonly known as: PROTONIX TAKE 1 TABLET BY MOUTH TWICE DAILY--- PRN   polyethylene glycol 17 g packet Commonly known as: MIRALAX / GLYCOLAX Take 17 g by mouth daily as needed for mild constipation.   promethazine 25  MG tablet Commonly known as: PHENERGAN TAKE 1 TABLET BY MOUTH EVERY 8 HOURS AS NEEDED FOR NAUSEA AND VOMITING   rizatriptan 10 MG tablet Commonly known as: Maxalt Take 1 tablet (10 mg total) by mouth as needed for migraine. May repeat in 2 hours if needed   valACYclovir 500 MG tablet Commonly known as: VALTREX TAKE 1 TABLET BY MOUTH ONCE DAILY       Allergies:  Allergies  Allergen Reactions  . 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 Estradiol Rash and Other (See Comments)    Local rash with estradiol patches    Past Medical History, Surgical history, Social  history, and Family History were reviewed and updated.  Review of Systems: All other 10 point review of systems is negative.   Physical Exam:  vitals were not taken for this visit.   Wt Readings from Last 3 Encounters:  01/16/19 244 lb (110.7 kg)  01/08/19 258 lb (117 kg)  12/26/18 258 lb (117 kg)    Ocular: Sclerae unicteric, pupils equal, round and reactive to light Ear-nose-throat: Oropharynx clear, dentition fair Lymphatic: No cervical or supraclavicular adenopathy Lungs no rales or rhonchi, good excursion bilaterally Heart regular rate and rhythm, no murmur appreciated Abd soft, nontender, positive bowel sounds, no liver or spleen tip palpated on exam, no fluid wave  MSK no focal spinal tenderness, no joint edema Neuro: non-focal, well-oriented, appropriate affect Breasts: Deferred   Lab Results  Component Value Date   WBC 2.8 (L) 03/27/2019   HGB 12.1 03/27/2019   HCT 37.3 03/27/2019   MCV 98.4 03/27/2019   PLT 195 03/27/2019   Lab Results  Component Value Date   FERRITIN 186 11/27/2018   IRON 117 11/27/2018   TIBC 280 11/27/2018   UIBC 164 11/27/2018   IRONPCTSAT 42 11/27/2018   Lab Results  Component Value Date   RETICCTPCT 1.4 03/27/2019   RBC 3.64 (L) 03/27/2019   RETICCTABS 36,500 10/02/2018   No results found for: KPAFRELGTCHN, LAMBDASER, KAPLAMBRATIO No results found for: IGGSERUM, IGA, IGMSERUM No results found for: Odetta Pink, SPEI   Chemistry      Component Value Date/Time   NA 139 10/16/2018 0903   K 4.3 10/16/2018 0903   CL 105 10/16/2018 0903   CO2 29 10/16/2018 0903   BUN 14 10/16/2018 0903   CREATININE 0.89 10/16/2018 0903   CREATININE 0.88 10/02/2018 1210      Component Value Date/Time   CALCIUM 8.6 (L) 10/16/2018 0903   ALKPHOS 59 10/16/2018 0903   AST 26 10/16/2018 0903   ALT 26 10/16/2018 0903   BILITOT 0.5 10/16/2018 0903       Impression and Plan: Victoria [name] is a  very pleasant 59 yo African American female with iron deficiency anemia secondary to malabsorptionsincelaparoscopic gastric bypass in 2016.  She is symptomatic at this time with fatigue.  We will see what her iron studies look like and replace if needed.  We will go ahead and plan to see her back in another 3 months.  She will contact our office with any questions or concerns. We can certainly see her sooner if needed.   Laverna Peace, NP 1/29/20218:51 AM

## 2019-04-02 MED FILL — HUMIRA PEN 40 MG/0.4ML PNKT: 40 | 28 days supply | Qty: 4 | Fill #6

## 2019-04-09 ENCOUNTER — Other Ambulatory Visit: Payer: Self-pay

## 2019-04-09 ENCOUNTER — Other Ambulatory Visit: Payer: No Typology Code available for payment source | Admitting: Internal Medicine

## 2019-04-09 DIAGNOSIS — Z0000 Encounter for general adult medical examination without abnormal findings: Secondary | ICD-10-CM

## 2019-04-09 DIAGNOSIS — D508 Other iron deficiency anemias: Secondary | ICD-10-CM

## 2019-04-09 DIAGNOSIS — Z9884 Bariatric surgery status: Secondary | ICD-10-CM

## 2019-04-09 DIAGNOSIS — M1710 Unilateral primary osteoarthritis, unspecified knee: Secondary | ICD-10-CM

## 2019-04-09 DIAGNOSIS — K909 Intestinal malabsorption, unspecified: Secondary | ICD-10-CM

## 2019-04-09 DIAGNOSIS — M179 Osteoarthritis of knee, unspecified: Secondary | ICD-10-CM

## 2019-04-09 DIAGNOSIS — E538 Deficiency of other specified B group vitamins: Secondary | ICD-10-CM

## 2019-04-09 DIAGNOSIS — E039 Hypothyroidism, unspecified: Secondary | ICD-10-CM

## 2019-04-09 DIAGNOSIS — E7800 Pure hypercholesterolemia, unspecified: Secondary | ICD-10-CM

## 2019-04-09 DIAGNOSIS — E785 Hyperlipidemia, unspecified: Secondary | ICD-10-CM

## 2019-04-13 LAB — COMPLETE METABOLIC PANEL WITH GFR
AG Ratio: 1.3 (calc) (ref 1.0–2.5)
ALT: 21 U/L (ref 6–29)
AST: 20 U/L (ref 10–35)
Albumin: 3.9 g/dL (ref 3.6–5.1)
Alkaline phosphatase (APISO): 65 U/L (ref 37–153)
BUN: 15 mg/dL (ref 7–25)
CO2: 31 mmol/L (ref 20–32)
Calcium: 9.2 mg/dL (ref 8.6–10.4)
Chloride: 106 mmol/L (ref 98–110)
Creat: 0.79 mg/dL (ref 0.50–1.05)
GFR, Est African American: 96 mL/min/{1.73_m2} (ref >=60)
GFR, Est Non African American: 83 mL/min/{1.73_m2} (ref >=60)
Globulin: 2.9 g/dL (calc) (ref 1.9–3.7)
Glucose, Bld: 87 mg/dL (ref 65–99)
Potassium: 4.7 mmol/L (ref 3.5–5.3)
Sodium: 142 mmol/L (ref 135–146)
Total Bilirubin: 0.5 mg/dL (ref 0.2–1.2)
Total Protein: 6.8 g/dL (ref 6.1–8.1)

## 2019-04-13 LAB — CBC WITH DIFFERENTIAL/PLATELET
Absolute Monocytes: 288 cells/uL (ref 200–950)
Basophils Absolute: 19 cells/uL (ref 0–200)
Basophils Relative: 0.6 %
Eosinophils Absolute: 138 cells/uL (ref 15–500)
Eosinophils Relative: 4.3 %
HCT: 36.7 % (ref 35.0–45.0)
Hemoglobin: 12 g/dL (ref 11.7–15.5)
Lymphs Abs: 1562 cells/uL (ref 850–3900)
MCH: 32 pg (ref 27.0–33.0)
MCHC: 32.7 g/dL (ref 32.0–36.0)
MCV: 97.9 fL (ref 80.0–100.0)
MPV: 11.3 fL (ref 7.5–12.5)
Monocytes Relative: 9 %
Neutro Abs: 1194 cells/uL — ABNORMAL LOW (ref 1500–7800)
Neutrophils Relative %: 37.3 %
Platelets: 222 10*3/uL (ref 140–400)
RBC: 3.75 10*6/uL — ABNORMAL LOW (ref 3.80–5.10)
RDW: 12.1 % (ref 11.0–15.0)
Total Lymphocyte: 48.8 %
WBC: 3.2 10*3/uL — ABNORMAL LOW (ref 3.8–10.8)

## 2019-04-13 LAB — LIPID PANEL
Cholesterol: 204 mg/dL — ABNORMAL HIGH (ref <200)
HDL: 89 mg/dL (ref >=50)
LDL Cholesterol (Calc): 101 mg/dL (calc) — ABNORMAL HIGH
Non-HDL Cholesterol (Calc): 115 mg/dL (calc) (ref <130)
Total CHOL/HDL Ratio: 2.3 (calc) (ref <5.0)
Triglycerides: 46 mg/dL (ref <150)

## 2019-04-13 LAB — VITAMIN B12: Vitamin B-12: 398 pg/mL (ref 200–1100)

## 2019-04-13 LAB — TSH: TSH: 0.84 mIU/L (ref 0.40–4.50)

## 2019-04-13 LAB — FOLATE: Folate: 17.4 ng/mL

## 2019-04-13 LAB — IRON, TOTAL/TOTAL IRON BINDING CAP
%SAT: 37 % (calc) (ref 16–45)
Iron: 107 ug/dL (ref 45–160)
TIBC: 290 mcg/dL (calc) (ref 250–450)

## 2019-04-13 LAB — VITAMIN B1: Vitamin B1 (Thiamine): 9 nmol/L (ref 8–30)

## 2019-04-16 ENCOUNTER — Encounter: Payer: No Typology Code available for payment source | Admitting: Internal Medicine

## 2019-04-23 ENCOUNTER — Other Ambulatory Visit: Payer: Self-pay | Admitting: Pharmacist

## 2019-04-23 MED ORDER — HUMIRA (2 PEN) 40 MG/0.4ML ~~LOC~~ AJKT: 1.0000 "pen " | AUTO-INJECTOR | SUBCUTANEOUS | 5 refills | Status: DC

## 2019-04-30 ENCOUNTER — Other Ambulatory Visit: Payer: Self-pay | Admitting: Internal Medicine

## 2019-04-30 DIAGNOSIS — Z1231 Encounter for screening mammogram for malignant neoplasm of breast: Secondary | ICD-10-CM

## 2019-04-30 MED FILL — HUMIRA PEN 40 MG/0.4ML PNKT: 40 | 28 days supply | Qty: 4 | Fill #0

## 2019-05-05 ENCOUNTER — Encounter: Payer: Self-pay | Admitting: Internal Medicine

## 2019-05-05 ENCOUNTER — Other Ambulatory Visit: Payer: Self-pay | Admitting: Internal Medicine

## 2019-05-05 MED FILL — PROMETHAZINE 25 MG TABLET: 25 | 10 days supply | Qty: 30 | Fill #2

## 2019-05-05 MED FILL — ALPRAZolam 1 MG TABS: 1 | 30 days supply | Qty: 30 | Fill #2

## 2019-05-05 MED FILL — GABAPENTIN 600 MG TABLET: 600 | 30 days supply | Qty: 90 | Fill #2

## 2019-05-06 ENCOUNTER — Other Ambulatory Visit: Payer: Self-pay

## 2019-05-06 ENCOUNTER — Other Ambulatory Visit: Payer: Self-pay | Admitting: Internal Medicine

## 2019-05-06 MED ORDER — CYANOCOBALAMIN 1000 MCG/ML IJ SOLN: INTRAMUSCULAR | 11 refills | Status: DC

## 2019-05-06 MED FILL — CYANOCOBALAMIN 1,000 MCG/ML: 1000 | 84 days supply | Qty: 3 | Fill #0

## 2019-05-15 ENCOUNTER — Other Ambulatory Visit: Payer: Self-pay

## 2019-05-15 ENCOUNTER — Encounter: Payer: Self-pay | Admitting: Obstetrics & Gynecology

## 2019-05-15 ENCOUNTER — Other Ambulatory Visit: Payer: Self-pay | Admitting: Obstetrics & Gynecology

## 2019-05-15 ENCOUNTER — Ambulatory Visit (INDEPENDENT_AMBULATORY_CARE_PROVIDER_SITE_OTHER): Payer: No Typology Code available for payment source | Admitting: Obstetrics & Gynecology

## 2019-05-15 VITALS — BP 122/70 | HR 72 | Temp 97.3°F | Ht 66.0 in | Wt 270.0 lb

## 2019-05-15 DIAGNOSIS — Z01419 Encounter for gynecological examination (general) (routine) without abnormal findings: Secondary | ICD-10-CM | POA: Diagnosis not present

## 2019-05-15 MED ORDER — TRAZODONE HCL 50 MG PO TABS: 50.0000 mg | ORAL_TABLET | Freq: Every day | ORAL | 0 refills | Status: DC

## 2019-05-15 MED FILL — traZODone HCL 50 MG TABS: 50 | 30 days supply | Qty: 30 | Fill #0

## 2019-05-15 NOTE — Telephone Encounter (Signed)
Medication refill request: divigel Last AEX:  today Next AEX: 08-19-2020 Last MMG (if hormonal medication request): 03-24-2018 neg, scheduled for 05-22-2019 Refill authorized: please approve if appropriate

## 2019-05-15 NOTE — Patient Instructions (Addendum)
[facility] [place],  [place]  [address]  Main: [phone]  Dr. Verl Dicker, MD [phone]  [facility] at [address] [place], [place] [address]

## 2019-05-15 NOTE — Progress Notes (Signed)
59 y.o. G2P0112 Married Victoria [name] American female here for annual exam.  She had been seeing hematology and has received two iron infusions.  Had follow up blood work in January.  Does have follow with hematology again in April.  Denies vaginal bleeding.  Still having vaginal discharge.  Frustrated with weight gain.  Feels like she shouldn't have done the gastric bypass.    Has tried xanax for insomnia.  Is on 1mg  nightly prn.  Started this after her father died.  She is taking flexeril and gabapentin at night.  No LMP recorded. Patient has had a hysterectomy.          Sexually active: No.  The current method of family planning is status post hysterectomy.    Exercising: No.  The patient does not participate in regular exercise at present. Smoker:  no  Health Maintenance: Pap:  08/14/2018 Neg  History of abnormal Pap:  no MMG:  03/24/18 BIRADS 1 negative/density b -- scheduled 05/22/19 Colonoscopy:  03/29/17 polyps. F/u 5 years BMD:    03/24/18 normal TDaP:  2013 Pneumonia vaccine(s):  n/a Shingrix:   No Hep C testing: 08/08/15 Neg Screening Labs: PCP   reports that she has never smoked. She has never used smokeless tobacco. She reports that she does not drink alcohol or use drugs.  Past Medical History:  Diagnosis Date  . Chronic leukopenia    intermittant since 2010, followed by Dr. Renold Genta now  . Degenerative joint disease of low back 09/02/15    Dr. Maureen Ralphs  . Fibrocystic breast   . GERD (gastroesophageal reflux disease)   . Headache    per pt more stress/tension  . Heart palpitations    SEE EPIC ENCOUTNER , CARDIOLOGY DR. Tressia Miners TURNER 2018; reports on 05-16-17 " i haven't had any bouts of those lately"    . Hemorrhoids   . History of Clostridium difficile infection 12/2010  . History of exercise intolerance    ETT on 04-27-2016-- negative ([facility] treadmill score 7)  . History of Helicobacter pylori infection 2001 and 09/ 2012  . HSV-2 infection    genital  . Hx of  adenomatous colonic polyps 10/17/2007  . Hydradenitis    per pt currently treated with Humira injection  . Hypothyroidism, postsurgical 1980  . Osteoarthritis   . Pernicious anemia    b12 def  . PONV (postoperative nausea and vomiting)   . 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 Varicose veins   . Vitamin B 12 deficiency   . Vitamin D deficiency     Past Surgical History:  Procedure Laterality Date  . BREATH TEK H PYLORI N/A 05/03/2014   Procedure: BREATH TEK H PYLORI;  Surgeon: Kaylyn Lim, MD;  [facility];  Service: General;  Laterality: N/A;  . CARDIOVASCULAR STRESS TEST  03/11/2013   dr Tressia Miners turner   Low risk nuclear study w/ a very small anterior perfusion defect that most likely represents breast attenuation artifact, less likely this could represent a true perfusion abnormality in the distribution of diagonal branch of the LAD/  normal LV function and wall motion , ef 66%  . CATARACT EXTRACTION W/ INTRAOCULAR LENS  IMPLANT, BILATERAL  10/2017  . CESAREAN SECTION  1985  . CHOLECYSTECTOMY OPEN  1987  . COLONOSCOPY  02/2017   Dr [name] ; per patient they found 7 polyps with polypectomy; on 5 year track   .  FINE NEEDLE ASPIRATION Left 05/22/2017   Procedure: LEFT KNEE ASPIRATION;  Surgeon: Gaynelle Arabian, MD;  Location: [facility];  Service: Orthopedics;  Laterality: Left;  Marland Kitchen GASTRIC ROUX-EN-Y N/A 07/06/2014   Procedure: LAPAROSCOPIC ROUX-EN-Y GASTRIC BYPASS, ENTEROLYSIS OF ADHESIONS WITH UPPER ENDOSCOPY;  Surgeon: Johnathan Hausen, MD;  Location: [facility];  Service: General;  Laterality: N/A;  . HAMMER TOE SURGERY Bilateral 02/17/2008   bilateral foot digit's 2 and 5  . HIATAL HERNIA REPAIR N/A 07/06/2014   Procedure: LAPAROSCOPIC REPAIR OF HIATAL HERNIA;  Surgeon: Johnathan Hausen, MD;  [facility];  Service: General;  Laterality: N/A;  . KNEE ARTHROSCOPY WITH MEDIAL MENISECTOMY Left 07/17/2017    Procedure: LEFT KNEE ARTHROSCOPY WITH MEDIAL LATERAL MENISECTOMY;  Surgeon: Gaynelle Arabian, MD;  [facility];  Service: Orthopedics;  Laterality: Left;  Marland Kitchen MASS EXCISION N/A 10/11/2016   Procedure: EXCISION OF PERIANAL MASS;  Surgeon: [name], MD;  Location: [place];  Service: General;  Laterality: N/A;  . REFRACTIVE SURGERY    . THYROIDECTOMY  1980  . TOTAL ABDOMINAL HYSTERECTOMY  08-25-2002   dr Margaretha Glassing   ovaries retained  . TOTAL HIP ARTHROPLASTY Right 05/21/2012   Procedure: TOTAL HIP ARTHROPLASTY;  Surgeon: Gearlean Alf, MD;  [facility];  Service: Orthopedics;  Laterality: Right;  . TOTAL HIP ARTHROPLASTY Left 01-31-2009  dr Wynelle Link  . TOTAL HIP REVISION Left 05/22/2017   Procedure: Left hip bearing surface and head revision;  Surgeon: Gaynelle Arabian, MD;  [facility];  Service: Orthopedics;  Laterality: Left;  . TOTAL KNEE ARTHROPLASTY Left 01/20/2018   Procedure: LEFT TOTAL KNEE ARTHROPLASTY;  Surgeon: Gaynelle Arabian, MD;  [facility];  Service: Orthopedics;  Laterality: Left;  23min  . TRANSTHORACIC ECHOCARDIOGRAM  05/03/2016   ef 55-60%/  trivial MR/  mild TR  . TUBAL LIGATION      Current Outpatient Medications  Medication Sig Dispense Refill  . acetaminophen (TYLENOL) 500 MG tablet Take 1,000 mg by mouth every 6 (six) hours as needed (for pain).    . Adalimumab (HUMIRA PEN) 40 MG/0.4ML PNKT Inject 1 pen into the skin once a week. 4 each 5  . ALPRAZolam (XANAX) 1 MG tablet TAKE 1 TABLET BY MOUTH AT BEDTIME. 30 tablet 2  . CALCIUM PO Take by mouth. Few times a week    . cyanocobalamin (,VITAMIN B-12,) 1000 MCG/ML injection INJECT 1 ML INTRAMUSCULARLY EVERY MONTH 3 mL 11  . cyclobenzaprine (FLEXERIL) 10 MG tablet TAKE 1 TABLET BY MOUTH AT BEDTIME 90 tablet 3  . DIVIGEL 1 MG/GM GEL     . gabapentin (NEURONTIN) 600 MG tablet TAKE 1 TABLET BY MOUTH 3 TIMES DAILY. 90 tablet 2  . HYDROcodone-acetaminophen (NORCO) 10-325 MG tablet  Take 1 tablet by mouth every 6 (six) hours as needed. 20 tablet 0  . ibuprofen (ADVIL) 200 MG tablet Take 200 mg by mouth as needed.    Marland Kitchen levothyroxine (SYNTHROID) 112 MCG tablet TAKE 1 TABLET BY MOUTH ONCE A DAY 90 tablet 2  . Multiple Vitamin (MULTIVITAMIN PO) Take by mouth.    . pantoprazole (PROTONIX) 40 MG tablet TAKE 1 TABLET BY MOUTH TWICE DAILY--- PRN    . polyethylene glycol (MIRALAX / GLYCOLAX) packet Take 17 g by mouth daily as needed for mild constipation.    . promethazine (PHENERGAN) 25 MG tablet TAKE 1 TABLET BY MOUTH EVERY 8 HOURS AS NEEDED FOR NAUSEA AND VOMITING 30 tablet 11  . Rimegepant Sulfate (NURTEC) 75  MG TBDP Take 75 mg by mouth daily as needed. For migraines. Take as close to onset of migraine as possible. One daily maximum. 10 tablet 11  . valACYclovir (VALTREX) 500 MG tablet TAKE 1 TABLET BY MOUTH ONCE DAILY 90 tablet 3   No current facility-administered medications for this visit.   Facility-Administered Medications Ordered in Other Visits  Medication Dose Route Frequency Provider Last Rate Last Admin  . gadobenate dimeglumine (MULTIHANCE) injection 20 mL  20 mL Intravenous Once PRN Melvenia Beam, MD        Family History  Problem Relation Age of Onset  . Diabetes Father   . Diabetes Sister   . Hypertension Sister   . Hypertension Mother   . Headache Mother     Review of Systems  All other systems reviewed and are negative.   Exam:   BP 122/70 (BP Location: Right Arm, Patient Position: Sitting, Cuff Size: Large)   Pulse 72   Temp (!) 97.3 F (36.3 C) (Temporal)   Ht 5\' 6"  (1.676 m)   Wt 270 lb (122.5 kg)   BMI 43.58 kg/m   Height: 5\' 6"  (167.6 cm)  Ht Readings from Last 3 Encounters:  05/15/19 5\' 6"  (1.676 m)  03/27/19 5\' 7"  (1.702 m)  01/16/19 5' 7.5" (1.715 m)    General appearance: alert, cooperative and appears stated age Head: Normocephalic, without obvious abnormality, atraumatic Neck: no adenopathy, supple, symmetrical, trachea  midline and thyroid normal to inspection and palpation Lungs: clear to auscultation bilaterally Breasts: normal appearance, no masses or tenderness Heart: regular rate and rhythm Abdomen: soft, non-tender; bowel sounds normal; no masses,  no organomegaly Extremities: extremities normal, atraumatic, no cyanosis or edema Skin: Skin color, texture, turgor normal. No rashes or lesions Lymph nodes: Cervical, supraclavicular, and axillary nodes normal. No abnormal inguinal nodes palpated Neurologic: Grossly normal   Pelvic: External genitalia:  no lesions              Urethra:  normal appearing urethra with no masses, tenderness or lesions              Bartholins and Skenes: normal                 Vagina: normal appearing vagina with normal color and discharge, no lesions              Cervix: absent              Pap taken: No. Bimanual Exam:  Uterus:  uterus absent              Adnexa: no mass, fullness, tenderness               Rectovaginal: Confirms               Anus:  normal sphincter tone, no lesions  Chaperone, Terence Lux, CMA, was present for exam.  A:  Well Woman with normal exam PMP, on estradiol gel Hypothyroidism H/o gastric bypass Vit D deficiency Pernicious anemia H/O HSV H/o hidradenitis  Insomnia Vaginal discharge with complete vaginitis testing/pap that has been negative TAH 6/04  P:   Mammogram guidelines reviewed pap smear neg 2020 Lab work done with Dr. Renold Genta 03/2019 Does not need RF on divigel or Valtrex BMD and colonoscopy are UTD Trial of Trazodone 50mg  nightly.  She will not use the Xanax if tries trazodone. return annually or prn

## 2019-05-17 ENCOUNTER — Other Ambulatory Visit: Payer: Self-pay | Admitting: Obstetrics & Gynecology

## 2019-05-20 MED FILL — DIVIGEL 1 MG GEL PACKET: 1 | 90 days supply | Qty: 90 | Fill #0

## 2019-05-22 ENCOUNTER — Other Ambulatory Visit: Payer: Self-pay

## 2019-05-22 ENCOUNTER — Ambulatory Visit
Admission: RE | Admit: 2019-05-22 | Discharge: 2019-05-22 | Disposition: A | Discharge status: Home / Self Care | Payer: No Typology Code available for payment source | Source: Ambulatory Visit | Attending: Internal Medicine | Admitting: Internal Medicine

## 2019-05-22 DIAGNOSIS — Z1231 Encounter for screening mammogram for malignant neoplasm of breast: Secondary | ICD-10-CM

## 2019-06-01 MED FILL — HUMIRA PEN 40 MG/0.4ML PNKT: 40 | 28 days supply | Qty: 4 | Fill #1

## 2019-06-03 NOTE — Progress Notes (Signed)
[facility]    Provider:  Dr Jaynee Eagles Requesting Provider: Renold Genta Cresenciano Lick, MD Primary Care Provider:  Elby Showers, MD  CC:  Low Back Pain  HPI:  Victoria [name] is a 59 y.o. female here as requested by Elby Showers, MD for numbness in the feet. PMHx migraines, chiari malformation, vitamin B12 and vitamin D deficiency, post gastrectomy syndrome, pernicious anemia, osteoarthritis, hypothyroidism, C. difficile, palpitations, headache, degenerative disease of the low back, chronic leukopenia.  She has back pain and it radiates down the left leg. She has had injections in her back, she has been under the care of him for over a year, she has tried all sorts of conservative measures, se has been to PT. She had a hip revision and knee problems but this is not coming from hip or knee and xray of the knee is fine, Starts in the low back raves behind the leg and down to the toes on the left. Now the right is giving her trouble and radiating to her buttocks and down the leg. She is having weakness in the legs. She can't exercise. She is having urinary changes,.pain is severe, shooting are severe, waking her up at night, she is trying analgesics, muscle relaxants, gabapentin. No other focal neurologic deficits, associated symptoms, inciting events or modifiable factors.  Reviewed notes, labs and imaging from outside physicians, which showed:  Personally reviewed images MRI cervical spine and agree with the following 2020: MRI cervical spine (without) demonstrating: - Mild degenerative changes from C3-4 down to C6-7. - No spinal stenosis or foraminal narrowing  MRI brain was unremarkable  B12 398 B1 9 Folate nml  Reviewed MRI report 03/06/2006 L1-2:  Mild facet overgrowth.  No central or foraminal stenosis. L2-3:  Mild bilateral facet overgrowth.  No significant stenosis.   L3-4:  Minimal facet overgrowth. L4-5:  Mild facet overgrowth is present.  A small synovial cyst  is present posterior to the left facet.  This is not in a position to cause nerve impingement.   L5-S1:  Left excentric disc bulge noted.  Facet overgrowth noted.  There is mild left foraminal stenosis. IMPRESSION:   1.  Mild left foraminal stenosis at L5-S1, predominantly due to facet overgrowth as shown on image # 10 of series 4.   Review of Systems: Patient complains of symptoms per HPI as well as the following symptoms: severe leg pain, low back pain, weakness. Pertinent negatives and positives per HPI. All others negative.   Social History   Socioeconomic History  . Marital status: Married    Spouse name: Not on file  . Number of children: 2  . Years of education: Not on file  . Highest education level: Not on file  Occupational History  . Occupation: nurse  Tobacco Use  . Smoking status: Never Smoker  . Smokeless tobacco: Never Used  Substance and Sexual Activity  . Alcohol use: No  . Drug use: No  . Sexual activity: Not Currently    Partners: Male    Birth control/protection: Surgical  Other Topics Concern  . Not on file  Social History Narrative   Lives at home with spouse   Right handed   Caffeine: diet zero mtn dew, occasionally    Social Determinants of Health   Financial Resource Strain:   . Difficulty of Paying Living Expenses:   Food Insecurity:   . Worried About Charity fundraiser in the Last Year:   . [employer] of  Food in the Last Year:   Transportation Needs:   . Film/video editor (Medical):   Marland Kitchen Lack of Transportation (Non-Medical):   Physical Activity:   . Days of Exercise per [name]:   . Minutes of Exercise per Session:   Stress:   . Feeling of Stress :   Social Connections:   . Frequency of Communication with Friends and Family:   . Frequency of Social Gatherings with Friends and Family:   . Attends Religious Services:   . Active Member of Clubs or Organizations:   . Attends Archivist Meetings:   Marland Kitchen Marital Status:   Intimate  Partner Violence:   . Fear of Current or Ex-Partner:   . Emotionally Abused:   Marland Kitchen Physically Abused:   . Sexually Abused:     Family History  Problem Relation Age of Onset  . Diabetes Father   . Diabetes Sister   . Hypertension Sister   . Hypertension Mother   . Headache Mother     Past Medical History:  Diagnosis Date  . Chronic leukopenia    intermittant since 2010, followed by Dr. Renold Genta now  . Degenerative joint disease of low back 09/02/15    Dr. Maureen Ralphs  . Fibrocystic breast   . GERD (gastroesophageal reflux disease)   . Headache    per pt more stress/tension  . Heart palpitations    SEE EPIC ENCOUTNER , CARDIOLOGY DR. Tressia Miners TURNER 2018; reports on 05-16-17 " i haven't had any bouts of those lately"    . Hemorrhoids   . History of Clostridium difficile infection 12/2010  . History of exercise intolerance    ETT on 04-27-2016-- negative ([facility] treadmill score 7)  . History of Helicobacter pylori infection 2001 and 09/ 2012  . HSV-2 infection    genital  . Hx of adenomatous colonic polyps 10/17/2007  . Hydradenitis    per pt currently treated with Humira injection  . Hypothyroidism, postsurgical 1980  . Osteoarthritis   . Pernicious anemia    b12 def  . PONV (postoperative nausea and vomiting)   . 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 Varicose veins   . Vitamin B 12 deficiency   . Vitamin D deficiency     Patient Active Problem List   Diagnosis Date Noted  . 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 IDA (iron deficiency anemia) 10/16/2018  . OA (osteoarthritis) of knee 01/20/2018  . Acute meniscal tear, medial 07/17/2017  . 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 Insomnia 11/08/2016  . Pernicious anemia 11/08/2016  . Leukopenia 07/09/2016  . Hypocupremia 07/09/2016  . Palpitations 04/12/2016  . Reactive hypoglycemia  10/31/2015  . 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 Lap gastric bypass May 2016 07/06/2014  . History of Clostridium difficile infection 08/12/2011  . Postoperative hypothyroidism 08/26/2010  . Gastroesophageal reflux disease 08/26/2010  . Vitamin D deficiency 08/26/2010  . Fibrocystic disease of breast 08/26/2010  . Cobalamin deficiency 08/26/2010  . Obesity 03/20/2010  . Backache 03/20/2010    Past Surgical History:  Procedure Laterality Date  . BREATH TEK H PYLORI N/A 05/03/2014   Procedure: BREATH TEK H PYLORI;  Surgeon: Kaylyn Lim, MD;  Location: [facility];  Service: General;  Laterality: N/A;  . CARDIOVASCULAR STRESS TEST  03/11/2013   dr Tressia Miners turner   Low risk nuclear study w/ a very small anterior perfusion  defect that most likely represents breast attenuation artifact, less likely this could represent a true perfusion abnormality in the distribution of diagonal branch of the LAD/  normal LV function and wall motion , ef 66%  . CATARACT EXTRACTION W/ INTRAOCULAR LENS  IMPLANT, BILATERAL  10/2017  . CESAREAN SECTION  1985  . CHOLECYSTECTOMY OPEN  1987  . COLONOSCOPY  02/2017   Dr [name] ; per patient they found 7 polyps with polypectomy; on 5 year track   . FINE NEEDLE ASPIRATION Left 05/22/2017   Procedure: LEFT KNEE ASPIRATION;  Surgeon: Gaynelle Arabian, MD;  [facility];  Service: Orthopedics;  Laterality: Left;  Marland Kitchen GASTRIC ROUX-EN-Y N/A 07/06/2014   Procedure: LAPAROSCOPIC ROUX-EN-Y GASTRIC BYPASS, ENTEROLYSIS OF ADHESIONS WITH UPPER ENDOSCOPY;  Surgeon: Johnathan Hausen, MD;  [facility];  Service: General;  Laterality: N/A;  . HAMMER TOE SURGERY Bilateral 02/17/2008   bilateral foot digit's 2 and 5  . HIATAL HERNIA REPAIR N/A 07/06/2014   Procedure: LAPAROSCOPIC REPAIR OF HIATAL HERNIA;  Surgeon: Johnathan Hausen, MD;  [facility]: [facility];  Service: General;  Laterality: N/A;  . KNEE ARTHROSCOPY WITH MEDIAL MENISECTOMY Left 07/17/2017   Procedure: LEFT KNEE  ARTHROSCOPY WITH MEDIAL LATERAL MENISECTOMY;  Surgeon: Gaynelle Arabian, MD;  [facility]: [facility];  Service: Orthopedics;  Laterality: Left;  Marland Kitchen MASS EXCISION N/A 10/11/2016   Procedure: EXCISION OF PERIANAL MASS;  Surgeon: [name], MD;  Location: [place];  Service: General;  Laterality: N/A;  . REFRACTIVE SURGERY    . THYROIDECTOMY  1980  . TOTAL ABDOMINAL HYSTERECTOMY  08-25-2002   dr Margaretha Glassing   ovaries retained  . TOTAL HIP ARTHROPLASTY Right 05/21/2012   Procedure: TOTAL HIP ARTHROPLASTY;  Surgeon: Gearlean Alf, MD;  [facility];  Service: Orthopedics;  Laterality: Right;  . TOTAL HIP ARTHROPLASTY Left 01-31-2009  dr Wynelle Link  . TOTAL HIP REVISION Left 05/22/2017   Procedure: Left hip bearing surface and head revision;  Surgeon: Gaynelle Arabian, MD;  [facility];  Service: Orthopedics;  Laterality: Left;  . TOTAL KNEE ARTHROPLASTY Left 01/20/2018   Procedure: LEFT TOTAL KNEE ARTHROPLASTY;  Surgeon: Gaynelle Arabian, MD;  [facility];  Service: Orthopedics;  Laterality: Left;  49min  . TRANSTHORACIC ECHOCARDIOGRAM  05/03/2016   ef 55-60%/  trivial MR/  mild TR  . TUBAL LIGATION      Current Outpatient Medications  Medication Sig Dispense Refill  . acetaminophen (TYLENOL) 500 MG tablet Take 1,000 mg by mouth every 6 (six) hours as needed (for pain).    . Adalimumab (HUMIRA PEN) 40 MG/0.4ML PNKT Inject 1 pen into the skin once a [name]. 4 each 5  . ALPRAZolam (XANAX) 1 MG tablet TAKE 1 TABLET BY MOUTH AT BEDTIME. 30 tablet 2  . CALCIUM PO Take by mouth. Few times a [name]    . cyanocobalamin (,VITAMIN B-12,) 1000 MCG/ML injection INJECT 1 ML INTRAMUSCULARLY EVERY MONTH 3 mL 11  . cyclobenzaprine (FLEXERIL) 10 MG tablet TAKE 1 TABLET BY MOUTH AT BEDTIME 90 tablet 3  . DIVIGEL 1 MG/GM GEL APPLY TO THE SKIN AT BEDTIME 90 g 4  . gabapentin (NEURONTIN) 600 MG tablet TAKE 1 TABLET BY MOUTH 3 TIMES DAILY. 90 tablet 2  . HYDROcodone-acetaminophen (NORCO) 10-325 MG  tablet Take 1 tablet by mouth every 6 (six) hours as needed. 20 tablet 0  . ibuprofen (ADVIL) 200 MG tablet Take 200 mg by mouth as needed.    Marland Kitchen levothyroxine (SYNTHROID) 112 MCG  tablet TAKE 1 TABLET BY MOUTH ONCE A DAY 90 tablet 2  . Multiple Vitamin (MULTIVITAMIN PO) Take by mouth.    . pantoprazole (PROTONIX) 40 MG tablet TAKE 1 TABLET BY MOUTH TWICE DAILY--- PRN    . polyethylene glycol (MIRALAX / GLYCOLAX) packet Take 17 g by mouth daily as needed for mild constipation.    . promethazine (PHENERGAN) 25 MG tablet TAKE 1 TABLET BY MOUTH EVERY 8 HOURS AS NEEDED FOR NAUSEA AND VOMITING 30 tablet 11  . Rimegepant Sulfate (NURTEC) 75 MG TBDP Take 75 mg by mouth daily as needed. For migraines. Take as close to onset of migraine as possible. One daily maximum. 10 tablet 11  . traZODone (DESYREL) 50 MG tablet Take 1 tablet (50 mg total) by mouth at bedtime. 30 tablet 0  . valACYclovir (VALTREX) 500 MG tablet TAKE 1 TABLET BY MOUTH ONCE DAILY 90 tablet 3  . methylPREDNISolone (MEDROL DOSEPAK) 4 MG TBPK tablet Take pills in the morning with food for 6 days.taper as follows: 6-5-4-3-2-1 21 tablet 1   No current facility-administered medications for this visit.   Facility-Administered Medications Ordered in Other Visits  Medication Dose Route Frequency Provider Last Rate Last Admin  . gadobenate dimeglumine (MULTIHANCE) injection 20 mL  20 mL Intravenous Once PRN Melvenia Beam, MD        Allergies as of 06/04/2019 - Review Complete 06/04/2019  Allergen Reaction Noted  . Other Other (See Comments) 02/11/2018  . Estradiol Rash and Other (See Comments) 03/12/2016    Vitals: BP 120/79 (BP Location: Right Arm, Patient Position: Sitting)   Pulse 67   Temp (!) 97.2 F (36.2 C) Comment: taken at front  Ht 5' 6.5" (1.689 m)   Wt 268 lb (121.6 kg)   BMI 42.61 kg/m  Last Weight:  Wt Readings from Last 1 Encounters:  06/04/19 268 lb (121.6 kg)   Last Height:   Ht Readings from Last 1  Encounters:  06/04/19 5' 6.5" (1.689 m)     Physical exam: Exam: Gen: NAD, conversant, well nourised, obese, well groomed                     CV: RRR, no MRG. No Carotid Bruits. No peripheral edema, warm, nontender Eyes: Conjunctivae clear without exudates or hemorrhage  Neuro: Detailed Neurologic Exam  Speech:    Speech is normal; fluent and spontaneous with normal comprehension.  Cognition:    The patient is oriented to person, place, and time;     recent and remote memory intact;     language fluent;     normal attention, concentration,     fund of knowledge Cranial Nerves:    The pupils are equal, round, and reactive to light. The fundi are flat. Visual fields are full to finger confrontation. Extraocular movements are intact. Trigeminal sensation is intact and the muscles of mastication are normal. The face is symmetric. The palate elevates in the midline. Hearing intact. Voice is normal. Shoulder shrug is normal. The tongue has normal motion without fasciculations.   Coordination:    No dysmetria Gait:    antalgic  Motor Observation:    No asymmetry, no atrophy, and no involuntary movements noted. Tone:    Normal muscle tone.    Posture:    Posture is normal. normal erect    Strength:  left hip flexion weakness 3/5, right 4+/5, weakness leg flexion left > right.       Sensation: intact to LT  Reflex Exam:  DTR's:   Absent AJs otherwise deep tendon reflexes in the upper and lower extremities are normal bilaterally.   Toes:    The toes are downgoing bilaterally.   Clonus:    Clonus is absent.    Assessment/Plan:  Patient with severe low back pain and radicular symptoms with concerning symptoms, weakness of the lower extremities, absent reflexes, needs MRI lumbar spine. Will give medrol dosepak for some relief in the meantime.   Orders Placed This Encounter  Procedures  . MR LUMBAR SPINE WO CONTRAST   Meds ordered this encounter  Medications  .  methylPREDNISolone (MEDROL DOSEPAK) 4 MG TBPK tablet    Sig: Take pills in the morning with food for 6 days.taper as follows: 6-5-4-3-2-1    Dispense:  21 tablet    Refill:  1    Cc: Baxley, Cresenciano Lick, MD,    Sarina Ill, MD  [facility] [address] [place], [place] [address]  Phone [phone] Fax [phone]

## 2019-06-04 ENCOUNTER — Encounter: Payer: Self-pay | Admitting: Neurology

## 2019-06-04 ENCOUNTER — Encounter: Payer: Self-pay | Admitting: Obstetrics & Gynecology

## 2019-06-04 ENCOUNTER — Telehealth: Payer: Self-pay | Admitting: Neurology

## 2019-06-04 ENCOUNTER — Telehealth: Payer: Self-pay | Admitting: *Deleted

## 2019-06-04 ENCOUNTER — Other Ambulatory Visit: Payer: Self-pay

## 2019-06-04 ENCOUNTER — Ambulatory Visit: Payer: No Typology Code available for payment source | Admitting: Neurology

## 2019-06-04 VITALS — BP 120/79 | HR 67 | Temp 97.2°F | Ht 66.5 in | Wt 268.0 lb

## 2019-06-04 DIAGNOSIS — R202 Paresthesia of skin: Secondary | ICD-10-CM

## 2019-06-04 DIAGNOSIS — M5417 Radiculopathy, lumbosacral region: Secondary | ICD-10-CM

## 2019-06-04 DIAGNOSIS — R200 Anesthesia of skin: Secondary | ICD-10-CM

## 2019-06-04 DIAGNOSIS — R339 Retention of urine, unspecified: Secondary | ICD-10-CM | POA: Diagnosis not present

## 2019-06-04 DIAGNOSIS — M48062 Spinal stenosis, lumbar region with neurogenic claudication: Secondary | ICD-10-CM

## 2019-06-04 DIAGNOSIS — R350 Frequency of micturition: Secondary | ICD-10-CM

## 2019-06-04 DIAGNOSIS — R29898 Other symptoms and signs involving the musculoskeletal system: Secondary | ICD-10-CM | POA: Diagnosis not present

## 2019-06-04 DIAGNOSIS — M4807 Spinal stenosis, lumbosacral region: Secondary | ICD-10-CM

## 2019-06-04 MED ORDER — METHYLPREDNISOLONE 4 MG PO TBPK: ORAL_TABLET | ORAL | 1 refills | Status: DC

## 2019-06-04 MED FILL — METHYLPREDNISOLONE 4 MG TBP: 4 | 6 days supply | Qty: 21 | Fill #0

## 2019-06-04 NOTE — Telephone Encounter (Signed)
Jayme Cloud Demetrio Lapping  [facility]  Phone Number: [phone]  Gm, I wanted to follow up with the trazodone Rx.i have tried it and the medication does not help we with sleep I have even tried increasing by taking 1 1/2,it seems to keep me awake longer, I eventually drift off to sleep and then in a couple of hours I am awake again.

## 2019-06-04 NOTE — Patient Instructions (Addendum)
MRI lumbar spine Medrol dosepak Methylprednisolone tablets What is this medicine? METHYLPREDNISOLONE (meth ill pred NISS oh lone) is a corticosteroid. It is commonly used to treat inflammation of the skin, joints, lungs, and other organs. Common conditions treated include asthma, allergies, and arthritis. It is also used for other conditions, such as blood disorders and diseases of the adrenal glands. This medicine may be used for other purposes; ask your health care provider or pharmacist if you have questions. COMMON BRAND NAME(S): Medrol, Medrol Dosepak What should I tell my health care provider before I take this medicine? They need to know if you have any of these conditions:  Cushing's syndrome  eye disease, vision problems  diabetes  glaucoma  heart disease  high blood pressure  infection (especially a virus infection such as chickenpox, cold sores, or herpes)  liver disease  mental illness  myasthenia gravis  osteoporosis  recently received or scheduled to receive a vaccine  seizures  stomach or intestine problems  thyroid disease  an unusual or allergic reaction to lactose, methylprednisolone, other medicines, foods, dyes, or preservatives  pregnant or trying to get pregnant  breast-feeding How should I use this medicine? Take this medicine by mouth with a glass of water. Follow the directions on the prescription label. Take this medicine with food. If you are taking this medicine once a day, take it in the morning. Do not take it more often than directed. Do not suddenly stop taking your medicine because you may develop a severe reaction. Your doctor will tell you how much medicine to take. If your doctor wants you to stop the medicine, the dose may be slowly lowered over time to avoid any side effects. Talk to your pediatrician regarding the use of this medicine in children. Special care may be needed. Overdosage: If you think you have taken too much of this  medicine contact a poison control center or emergency room at once. NOTE: This medicine is only for you. Do not share this medicine with others. What if I miss a dose? If you miss a dose, take it as soon as you can. If it is almost time for your next dose, talk to your doctor or health care professional. You may need to miss a dose or take an extra dose. Do not take double or extra doses without advice. What may interact with this medicine? Do not take this medicine with any of the following medications:  alefacept  echinacea  live virus vaccines  metyrapone  mifepristone This medicine may also interact with the following medications:  amphotericin B  aspirin and aspirin-like medicines  certain antibiotics like erythromycin, clarithromycin, troleandomycin  certain medicines for diabetes  certain medicines for fungal infections like ketoconazole  certain medicines for seizures like carbamazepine, phenobarbital, phenytoin  certain medicines that treat or prevent blood clots like warfarin  cholestyramine  cyclosporine  digoxin  diuretics  female hormones, like estrogens and birth control pills  isoniazid  NSAIDs, medicines for pain inflammation, like ibuprofen or naproxen  other medicines for myasthenia gravis  rifampin  vaccines This list may not describe all possible interactions. Give your health care provider a list of all the medicines, herbs, non-prescription drugs, or dietary supplements you use. Also tell them if you smoke, drink alcohol, or use illegal drugs. Some items may interact with your medicine. What should I watch for while using this medicine? Tell your doctor or healthcare professional if your symptoms do not start to get better or if they  get worse. Do not stop taking except on your doctor's advice. You may develop a severe reaction. Your doctor will tell you how much medicine to take. This medicine may increase your risk of getting an infection.  Tell your doctor or health care professional if you are around anyone with measles or chickenpox, or if you develop sores or blisters that do not heal properly. This medicine may increase blood sugar levels. Ask your healthcare provider if changes in diet or medicines are needed if you have diabetes. Tell your doctor or health care professional right away if you have any change in your eyesight. Using this medicine for a long time may increase your risk of low bone mass. Talk to your doctor about bone health. What side effects may I notice from receiving this medicine? Side effects that you should report to your doctor or health care professional as soon as possible:  allergic reactions like skin rash, itching or hives, swelling of the face, lips, or tongue  bloody or tarry stools  hallucination, loss of contact with reality  muscle cramps  muscle pain  palpitations  signs and symptoms of high blood sugar such as being more thirsty or hungry or having to urinate more than normal. You may also feel very tired or have blurry vision.  signs and symptoms of infection like fever or chills; cough; sore throat; pain or trouble passing urine Side effects that usually do not require medical attention (report to your doctor or health care professional if they continue or are bothersome):  changes in emotions or mood  constipation  diarrhea  excessive hair growth on the face or body  headache  nausea, vomiting  trouble sleeping  weight gain This list may not describe all possible side effects. Call your doctor for medical advice about side effects. You may report side effects to FDA at 1-800-FDA-1088. Where should I keep my medicine? Keep out of the reach of children. Store at room temperature between 20 and 25 degrees C (68 and 77 degrees F). Throw away any unused medicine after the expiration date. NOTE: This sheet is a summary. It may not cover all possible information. If you have  questions about this medicine, talk to your doctor, pharmacist, or health care provider.  2020 Elsevier/Gold Standard (2017-11-14 09:19:36)

## 2019-06-04 NOTE — Telephone Encounter (Signed)
no to the covid questions MR Lumbar spine wo contrast Dr. Lianne Bushy Focus Josem [id number] (exp. 06/09/19 to 07/09/19). Patient is scheduled at [facility] for 06/09/19.

## 2019-06-04 NOTE — Telephone Encounter (Signed)
Spoke with patient. Patient was seen in office on 05/15/19, started on trial of Trazodone 50 mg tab nightly for insomnia. Patient reports medication is not effective. She tried increasing to 1.5 tabs for one night, no change. Reports she has tried xanax previously, is also on gabapentin and flexeril.   Advised patient I will provide update to Dr. Sabra Heck and f/u with recommendations, patient agreeable.   Routing to Dr. Sabra Heck.

## 2019-06-04 NOTE — Telephone Encounter (Signed)
See telephone encounter dated 06/04/19.   Encounter closed.

## 2019-06-05 NOTE — Telephone Encounter (Signed)
I don't have any other recommendations for sleep with medications that I feel comfortable using.  She saw Dr. Jaynee Eagles yesterday in neurology.  Not sure if they discussed this or now.  Dr. Maureen Chatters is the sleep specialist there so this is a possible provider who could do additional evaluation if pt desires.

## 2019-06-05 NOTE — Telephone Encounter (Signed)
Spoke with patient. Advised per Dr. Sabra Heck. Patient states she dis not discuss with neuro. Patient will f/u with Dr. Jaynee Eagles to further discuss. Patient verbalizes understanding and is thankful for f/u.   Routing to provider for final review. Patient is agreeable to disposition. Will close encounter.

## 2019-06-07 ENCOUNTER — Encounter: Payer: Self-pay | Admitting: Neurology

## 2019-06-07 DIAGNOSIS — M5417 Radiculopathy, lumbosacral region: Secondary | ICD-10-CM | POA: Insufficient documentation

## 2019-06-08 ENCOUNTER — Other Ambulatory Visit: Payer: Self-pay | Admitting: Internal Medicine

## 2019-06-08 ENCOUNTER — Other Ambulatory Visit: Payer: Self-pay

## 2019-06-08 MED ORDER — ALPRAZOLAM 1 MG PO TABS: 1.0000 mg | ORAL_TABLET | Freq: Every day | ORAL | 5 refills | Status: DC

## 2019-06-08 MED FILL — ALPRAZolam 1 MG TABS: 1 | 30 days supply | Qty: 30 | Fill #0

## 2019-06-08 MED FILL — PROMETHAZINE 25 MG TABLET: 25 | 10 days supply | Qty: 30 | Fill #3

## 2019-06-08 MED FILL — CYCLOBENZAPRINE HCL 10 MG T: 10 | 90 days supply | Qty: 90 | Fill #3

## 2019-06-09 ENCOUNTER — Other Ambulatory Visit: Payer: Self-pay

## 2019-06-09 ENCOUNTER — Ambulatory Visit: Payer: No Typology Code available for payment source

## 2019-06-09 DIAGNOSIS — R200 Anesthesia of skin: Secondary | ICD-10-CM

## 2019-06-09 DIAGNOSIS — R202 Paresthesia of skin: Secondary | ICD-10-CM

## 2019-06-09 DIAGNOSIS — M4807 Spinal stenosis, lumbosacral region: Secondary | ICD-10-CM | POA: Diagnosis not present

## 2019-06-09 DIAGNOSIS — M5417 Radiculopathy, lumbosacral region: Secondary | ICD-10-CM | POA: Diagnosis not present

## 2019-06-09 DIAGNOSIS — R350 Frequency of micturition: Secondary | ICD-10-CM | POA: Diagnosis not present

## 2019-06-09 DIAGNOSIS — R29898 Other symptoms and signs involving the musculoskeletal system: Secondary | ICD-10-CM

## 2019-06-09 DIAGNOSIS — M48062 Spinal stenosis, lumbar region with neurogenic claudication: Secondary | ICD-10-CM

## 2019-06-09 DIAGNOSIS — R339 Retention of urine, unspecified: Secondary | ICD-10-CM | POA: Diagnosis not present

## 2019-06-11 ENCOUNTER — Encounter: Payer: Self-pay | Admitting: *Deleted

## 2019-06-11 ENCOUNTER — Telehealth: Payer: Self-pay | Admitting: *Deleted

## 2019-06-11 NOTE — Telephone Encounter (Signed)
Spoke with pt and discussed the MRI lumbar spine results from Dr. Jaynee Eagles. Pt verbalized understanding and agreed to schedule EMG/NCV. Pt scheduled for Thurs 4/29 @ 7:45 AM arrival 15-30 minutes prior. Pt asked if there was any sort of pre-cert and asked if she would know how much OOP cost she would have. I advised pt our office likely would not know OOP cost until insurance was billed but that I would send the message to billing to see if they can answer her other questions.

## 2019-06-11 NOTE — Telephone Encounter (Signed)
-----   Message from Melvenia Beam, MD sent at 06/11/2019  2:14 PM EDT ----- Victoria [name], MRI of the lumbar spine is essentially normal. I ordered an emg/ncs and I think she should come back for that please let her know to get that scheduled thanks

## 2019-06-15 MED FILL — LEVOTHYROXINE SODIUM 112 MC: 112 | 90 days supply | Qty: 90 | Fill #2

## 2019-06-19 ENCOUNTER — Encounter: Payer: Self-pay | Admitting: Family

## 2019-06-19 ENCOUNTER — Other Ambulatory Visit: Payer: Self-pay

## 2019-06-19 ENCOUNTER — Telehealth: Payer: Self-pay

## 2019-06-19 ENCOUNTER — Inpatient Hospital Stay: Payer: No Typology Code available for payment source | Attending: Family | Admitting: Family

## 2019-06-19 ENCOUNTER — Inpatient Hospital Stay: Payer: No Typology Code available for payment source

## 2019-06-19 VITALS — BP 125/75 | HR 71 | Temp 97.1°F | Resp 18 | Ht 66.0 in | Wt 269.1 lb

## 2019-06-19 DIAGNOSIS — D508 Other iron deficiency anemias: Secondary | ICD-10-CM | POA: Diagnosis not present

## 2019-06-19 DIAGNOSIS — D509 Iron deficiency anemia, unspecified: Secondary | ICD-10-CM | POA: Diagnosis present

## 2019-06-19 DIAGNOSIS — K909 Intestinal malabsorption, unspecified: Secondary | ICD-10-CM

## 2019-06-19 LAB — IRON AND TIBC
Iron: 139 ug/dL (ref 41–142)
Saturation Ratios: 51 % (ref 21–57)
TIBC: 273 ug/dL (ref 236–444)
UIBC: 133 ug/dL (ref 120–384)

## 2019-06-19 LAB — CBC WITH DIFFERENTIAL (CANCER CENTER ONLY)
Abs Immature Granulocytes: 0.02 10*3/uL (ref 0.00–0.07)
Basophils Absolute: 0 10*3/uL (ref 0.0–0.1)
Basophils Relative: 1 %
Eosinophils Absolute: 0.1 10*3/uL (ref 0.0–0.5)
Eosinophils Relative: 3 %
HCT: 38.5 % (ref 36.0–46.0)
Hemoglobin: 12.8 g/dL (ref 12.0–15.0)
Immature Granulocytes: 1 %
Lymphocytes Relative: 39 %
Lymphs Abs: 1.5 10*3/uL (ref 0.7–4.0)
MCH: 32.5 pg (ref 26.0–34.0)
MCHC: 33.2 g/dL (ref 30.0–36.0)
MCV: 97.7 fL (ref 80.0–100.0)
Monocytes Absolute: 0.4 10*3/uL (ref 0.1–1.0)
Monocytes Relative: 11 %
Neutro Abs: 1.7 10*3/uL (ref 1.7–7.7)
Neutrophils Relative %: 45 %
Platelet Count: 217 10*3/uL (ref 150–400)
RBC: 3.94 MIL/uL (ref 3.87–5.11)
RDW: 13.5 % (ref 11.5–15.5)
WBC Count: 3.8 10*3/uL — ABNORMAL LOW (ref 4.0–10.5)
nRBC: 0 % (ref 0.0–0.2)

## 2019-06-19 LAB — FERRITIN: Ferritin: 43 ng/mL (ref 11–307)

## 2019-06-19 LAB — RETICULOCYTES
Immature Retic Fract: 8.3 % (ref 2.3–15.9)
RBC.: 3.97 MIL/uL (ref 3.87–5.11)
Retic Count, Absolute: 35.3 10*3/uL (ref 19.0–186.0)
Retic Ct Pct: 0.9 % (ref 0.4–3.1)

## 2019-06-19 NOTE — Progress Notes (Signed)
Hematology and Oncology Follow Up Visit  Victoria [name] [id number] 09/22/1960 59 y.o. 06/19/2019   Principle Diagnosis:  Iron deficiency anemia secondary to malabsorption, s/p gastric bypass in 2016  Current Therapy:        IV iron as indicated    Interim History:  Victoria [name] is here today for follow-up. She is not sleeping well and feeling fatigued.  She uses Divigel daily which helps with hot flashes and night sweats.  She has been under a lot of stress with work lately and had some CP yesterday which she feels may have been due to anxiety.  Her appetite is down but she is hydrating. Her weight is stable at 269 lbs.  She has not noted any blood loss. No bruising or petechiae.  No fever, chills, cough, rash, dizziness, SOB, palpitations, abdominal pain or changes in bowel or bladder habits.  She feels that she retains fluid at times. No swelling noted on exam.  She has intermittent numbness and tingling in her fingertips and radiating n/t down her leg into her toes. She has seen her neurologist and MRI showed degenerative disc disease.  She is scheduled for a nerve conduction test and EMG next week.  She has chronic bilateral knee pain and has followed up with Dr. Maureen Ralphs post knee replacement and is considering getting a second opinion.  No falls or syncopal episodes to report.   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 If you have any questions, ask your nurse or doctor.        acetaminophen 500 MG tablet Commonly known as: TYLENOL Take 1,000 mg by mouth every 6 (six) hours as needed (for pain).   Advil 200 MG tablet Generic drug: ibuprofen Take  200 mg by mouth as needed.   ALPRAZolam 1 MG tablet Commonly known as: XANAX Take 1 tablet (1 mg total) by mouth at bedtime.   CALCIUM PO Take by mouth. Few times a week   cyanocobalamin 1000 MCG/ML injection Commonly known as: (VITAMIN B-12) INJECT 1 ML INTRAMUSCULARLY EVERY MONTH   cyclobenzaprine 10 MG tablet Commonly known as: FLEXERIL TAKE 1 TABLET BY MOUTH AT BEDTIME   Divigel 1 MG/GM Gel Generic drug: Estradiol APPLY TO THE SKIN AT BEDTIME   gabapentin 600 MG tablet Commonly known as: NEURONTIN TAKE 1 TABLET BY MOUTH 3 TIMES DAILY.   Humira Pen 40 MG/0.4ML Pnkt Generic drug: Adalimumab Inject 1 pen into the skin once a week.   HYDROcodone-acetaminophen 10-325 MG tablet Commonly known as: NORCO Take 1 tablet by mouth every 6 (six) hours as needed.   levothyroxine 112 MCG tablet Commonly known as: SYNTHROID TAKE 1 TABLET BY MOUTH ONCE A DAY   methylPREDNISolone 4 MG Tbpk tablet Commonly known as: MEDROL DOSEPAK Take pills in the morning with food for 6 days.taper as follows: 6-5-4-3-2-1   MULTIVITAMIN PO Take by mouth.   Nurtec 75 MG Tbdp Generic drug: Rimegepant Sulfate Take 75 mg by mouth daily as needed. For migraines. Take as close to onset of migraine as possible. One daily maximum.   pantoprazole 40 MG tablet Commonly  known as: PROTONIX TAKE 1 TABLET BY MOUTH TWICE DAILY--- PRN   polyethylene glycol 17 g packet Commonly known as: MIRALAX / GLYCOLAX Take 17 g by mouth daily as needed for mild constipation.   promethazine 25 MG tablet Commonly known as: PHENERGAN TAKE 1 TABLET BY MOUTH EVERY 8 HOURS AS NEEDED FOR NAUSEA AND VOMITING   traZODone 50 MG tablet Commonly known as: DESYREL Take 1 tablet (50 mg total) by mouth at bedtime.   valACYclovir 500 MG tablet Commonly known as: VALTREX TAKE 1 TABLET BY MOUTH ONCE DAILY       Allergies:  Allergies  Allergen Reactions  . Other Other (See Comments)    Developed metal toxicity from a  hip replacement gone awry  . Estradiol Rash and Other (See Comments)    Local rash with estradiol patches    Past Medical History, Surgical history, Social history, and Family History were reviewed and updated.  Review of Systems: All other 10 point review of systems is negative.   Physical Exam:  vitals were not taken for this visit.   Wt Readings from Last 3 Encounters:  06/04/19 268 lb (121.6 kg)  05/15/19 270 lb (122.5 kg)  03/27/19 262 lb (118.8 kg)    Ocular: Sclerae unicteric, pupils equal, round and reactive to light Ear-nose-throat: Oropharynx clear, dentition fair Lymphatic: No cervical or supraclavicular adenopathy Lungs no rales or rhonchi, good excursion bilaterally Heart regular rate and rhythm, no murmur appreciated Abd soft, nontender, positive bowel sounds, no liver or spleen tip palpated on exam, no fluid wave  MSK no focal spinal tenderness, no joint edema Neuro: non-focal, well-oriented, appropriate affect Breasts: Deferred   Lab Results  Component Value Date   WBC 3.8 (L) 06/19/2019   HGB 12.8 06/19/2019   HCT 38.5 06/19/2019   MCV 97.7 06/19/2019   PLT 217 06/19/2019   Lab Results  Component Value Date   FERRITIN 53 03/27/2019   IRON 107 04/09/2019   TIBC 290 04/09/2019   UIBC 183 03/27/2019   IRONPCTSAT 37 04/09/2019   Lab Results  Component Value Date   RETICCTPCT 0.9 06/19/2019   RBC 3.94 06/19/2019   RETICCTABS 36,500 10/02/2018   No results found for: KPAFRELGTCHN, LAMBDASER, KAPLAMBRATIO No results found for: IGGSERUM, IGA, IGMSERUM No results found for: Odetta Pink, SPEI   Chemistry      Component Value Date/Time   NA 142 04/09/2019 0927   K 4.7 04/09/2019 0927   CL 106 04/09/2019 0927   CO2 31 04/09/2019 0927   BUN 15 04/09/2019 0927   CREATININE 0.79 04/09/2019 0927      Component Value Date/Time   CALCIUM 9.2 04/09/2019 0927   ALKPHOS 59 10/16/2018 0903   AST 20  04/09/2019 0927   AST 26 10/16/2018 0903   ALT 21 04/09/2019 0927   ALT 26 10/16/2018 0903   BILITOT 0.5 04/09/2019 0927   BILITOT 0.5 10/16/2018 [id number]       Impression and Plan: Victoria [name] is a very pleasant 59 yo African American female with iron deficiency anemia secondary to malabsorptionsincelaparoscopic gastric bypass in 2016. We will see what her iron studies look like and replace if needed.  We will go ahead and plan to see her back in another 4 months.  She will contact our office with any questions or concerns. We can certainly see her sooner if needed.   Laverna Peace, NP 4/23/20218:29 AM

## 2019-06-19 NOTE — Telephone Encounter (Signed)
Where does she want to go?

## 2019-06-19 NOTE — Telephone Encounter (Signed)
Patient states that she has Focus Plan and is wanting a referral for 2nd Opinion for her right knee pain and now the left knee she is having trouble with.  Patient has an appointment here on 07-02-19.   She has had a right knee replacement and has been in pain every since.   Please review and Advise.  Thank you

## 2019-06-24 NOTE — Progress Notes (Signed)
History: Patient with severe low back pain and radicular symptoms,  Here for emg/ncs. MRI of the lumbar spine was essentially normal, no cental or foraminal stenosis or nerve impingement but she does have degenerative endplate disease at [id number] which may be causing her low back discomfort. The sensory changes from the left knee to the top of the foot is peroneal neuropathy and I will touch base with Dr. Renold Genta as patient states Dr. Renold Genta was going to refer her to ortho for knee pain; Ortho can also evaluate for any association with her knee or other causes in and around the knee for her peroneal neuropathy to look for reversible causes. I reviewed images with patient, discussed results. I suggest facet blocks or other pain intervention L5/S1 for her low back pain. She does have low back pain and some evidence of lumbar radic on emg/ncs but I think the peroneal neuropathy is causing the majority of her discomfort.   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 Lumbosacral radiculopathy    diagnosis.  This included previsit chart review, lab review, study review, order entry, electronic health record documentation, patient education on the different diagnostic and therapeutic options, counseling and coordination of care, risks and benefits of management, compliance, or risk factor reduction

## 2019-06-25 ENCOUNTER — Ambulatory Visit: Payer: No Typology Code available for payment source | Admitting: Neurology

## 2019-06-25 ENCOUNTER — Ambulatory Visit (INDEPENDENT_AMBULATORY_CARE_PROVIDER_SITE_OTHER): Payer: No Typology Code available for payment source | Admitting: Neurology

## 2019-06-25 ENCOUNTER — Other Ambulatory Visit: Payer: Self-pay

## 2019-06-25 DIAGNOSIS — M79605 Pain in left leg: Secondary | ICD-10-CM

## 2019-06-25 DIAGNOSIS — Z0289 Encounter for other administrative examinations: Secondary | ICD-10-CM

## 2019-06-25 DIAGNOSIS — G5732 Lesion of lateral popliteal nerve, left lower limb: Secondary | ICD-10-CM

## 2019-06-25 DIAGNOSIS — M5417 Radiculopathy, lumbosacral region: Secondary | ICD-10-CM

## 2019-06-25 DIAGNOSIS — R202 Paresthesia of skin: Secondary | ICD-10-CM

## 2019-06-25 NOTE — Patient Instructions (Addendum)
Injections into the low back: Dr. Maryjean Ka at [facility]   Common Peroneal Nerve Entrapment  Common peroneal nerve entrapment is a condition that can make it hard to lift a foot. The condition results from pressure on a nerve in the lower leg called the common peroneal nerve. Your common peroneal nerve provides feeling to your outer lower leg and foot. It also supplies the muscles that move your foot and toes upward and outward. What are the causes? This condition may be caused by:  Sitting cross-legged, squatting, or kneeling for long periods of time.  A hard, direct hit to the side of the lower leg.  Swelling from a knee injury.  A break (fracture) in one of the lower leg bones.  Wearing a boot or cast that ends just below the knee.  A growth or cyst near the nerve. What increases the risk? This condition is more likely to develop in people who play:  Contact sports, such as football or hockey.  Sports where you wear high and stiff boots, such as skiing. What are the signs or symptoms? Symptoms of this condition include:  Trouble lifting your foot up (foot drop).  Tripping often.  Your foot hitting the ground harder than normal as you walk.  Numbness, tingling, or pain in the outside of the knee, outside of the lower leg, and top of the foot.  Sensitivity to pressure on the front or side of the leg. How is this diagnosed? This condition may be diagnosed based on:  Your symptoms.  Your medical history.  A physical exam.  Tests, such as: ? An X-ray to check the bones of your knee and leg. ? MRI to check tendons that attach to the side of your knee. ? An ultrasound to check for a growth or cyst. ? An electromyogram (EMG) to check your nerves. During your physical exam, your health care provider will check for numbness in your leg and test the strength of your lower leg muscles. He or she may tap the side of your lower leg to see if that causes tingling. How is  this treated? Treatment for this condition may include:  Avoiding activities that make symptoms worse.  Using a brace to hold up your foot and toes.  Taking anti-inflammatory pain medicines to relieve swelling and lessen pain.  Having medicines injected into your ankle joint to lessen pain and swelling.  Doing exercises to help you regain or maintain movement (physical therapy).  Surgery to take pressure off the nerve. This may be needed if there is no improvement after 2-3 months or if there is a growth pushing on the nerve.  Returning gradually to full activity. Follow these instructions at home: If you have a brace:  Wear it as told by your health care provider. Remove it only as told by your health care provider.  Loosen the brace if your toes tingle, become numb, or turn cold and blue.  Keep the brace clean.  If the brace is not waterproof: ? Do not let it get wet. ? Cover it with a watertight covering when you take a bath or a shower.  Ask your health care provider when it is safe to drive with a brace on your foot. Activity  Return to your normal activities as told by your health care provider. Ask your health care provider what activities are safe for you.  Do not do any activities that make pain or swelling worse.  Do exercises as told by  your health care provider. General instructions  Take over-the-counter and prescription medicines only as told by your health care provider.  Do not put your full weight on your knee until your health care provider says you can. Use crutches as directed by your health care provider.  Keep all follow-up visits as told by your health care provider. This is important. How is this prevented?  Wear supportive footwear that is appropriate for your athletic activity.  Avoid athletic activities that cause ankle pain or swelling.  Wear protective padding over your lower legs when playing contact sports.  Make sure your boots do  not put extra pressure on the area just below your knees.  Do not sit cross-legged for long periods of time. Contact a health care provider if:  Your symptoms do not get better in 2-3 months.  The weakness or numbness in your leg or foot gets worse. Summary  Common peroneal nerve entrapment is a condition that results from pressure on a nerve in the lower leg called the common peroneal nerve.  This condition may be caused by a hard hit, swelling, a fracture, or a cyst in the lower leg.  Treatment may include rest, a brace, medicines, and physical therapy. Sometimes surgery is needed.  Do not do any activities that make pain or swelling worse. This information is not intended to replace advice given to you by your health care provider. Make sure you discuss any questions you have with your health care provider. Document Revised: 12/23/2017 Document Reviewed: 12/23/2017 Elsevier Patient Education  2020 [employer].

## 2019-06-25 NOTE — Progress Notes (Signed)
Full Name: Victoria [name] Gender: Female MRN #: [id number] Date of Birth: 1960-10-20    Visit Date: 06/25/2019 07:51 Age: 59 Years Examining Physician: Sarina Ill, MD  Referring Physician: Emeline General, MD Height: 5 feet 6 inch    History: Patient with numbness and tingling left leg from the knee to the top of the foot. She has also had low back pain and radicular symptoms in the past.   Summary: EMG/NCS performed on the left lower extremity.  The left peroneal motor nerve (recording from the EDB) showed a 17 m/s drop in conduction velocity across the fibular head (normal less than 10).  The left tibial motor nerve showed reduced amplitude (3.1 mV, normal greater than 4).  The left peroneal motor nerve (recording from the tib ant) showed reduced amplitude (1.2 mV, normal greater than 3).  The left superficial peroneal sensory nerve showed reduced amplitude (4 V, normal greater than 6).  The right tibial F wave showed delayed latency (60.6 ms, normal less than 56).  The left tibial F wave showed delayed latency (64.0 ms, normal less than 56).All remaining nerves (as indicated in the following tables) were within normal limits.  The left tibialis anterior muscle showed decreased insertional activity as well as diminished recruitment.  The left peroneus longus showed decreased insertional activity.  All remaining muscles (as indicated in the following tables) were within normal limits.    Conclusion: 1. 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  Distal peroneal-innervated muscles showed decreased insertional activity on emg needle examination likely due to atrophy.  2. Nerve conductions also indicated concomitant lumbosacral radiculopathy however emg needle exam did not show any acute/ongoing denervation in proximal muscles to suggest currently active syndrome.  3. No suggestion of peripheral polyneuropathy   Sarina Ill, M.D.  [facility] [place], [place] [address] Tel: [phone] Fax: [phone]  Verbal informed consent was obtained from the patient, patient was informed of potential risk of procedure, including bruising, bleeding, hematoma formation, infection, muscle weakness, muscle pain, numbness, among others.         [place]    Nerve / Sites Muscle Latency Ref. Amplitude Ref. Rel Amp Segments Distance Velocity Ref. Area    ms ms mV mV %  cm m/s m/s mVms  R Peroneal - EDB     Ankle EDB 3.9 ?6.5 6.4 ?2.0 100 Ankle - EDB 9   14.5     Fib head EDB 11.0  5.0  78.2 Fib head - Ankle 32 45 ?44 13.6     Pop fossa EDB 13.1  5.0  99.5 Pop fossa - Fib head 10 48 ?44 13.6         Pop fossa - Ankle      L Peroneal - EDB     Ankle EDB 4.1 ?6.5 4.4 ?2.0 100 Ankle - EDB 9   11.8     Fib head EDB 11.0  0.7  15.9 Fib head - Ankle 30 44 ?44 1.6     Pop fossa EDB 12.6  3.2  458 Pop fossa - Fib head 10 61 ?44 11.3         Pop fossa - Ankle      R Tibial - AH     Ankle AH 3.6 ?5.8 5.4 ?4.0 100 Ankle - AH 9   9.8     Pop fossa AH 13.8  3.0  56.3 Pop fossa -  Ankle 42 41 ?41 7.1  L Tibial - AH     Ankle AH 5.0 ?5.8 3.1 ?4.0 100 Ankle - AH 9   8.1     Pop fossa AH 14.2  2.6  82.2 Pop fossa - Ankle 40 43 ?41 6.7  L Peroneal - Tib Ant     Fib Head Tib Ant 4.4 ?6.7 1.2 ?3.0 100 Fib Head - Tib Ant 10   8.5     Pop fossa Tib Ant 6.0  3.0  251 Pop fossa - Fib Head 10 62 ?44 14.4               SNC    Nerve / Sites Rec. Site Peak Lat Ref.  Amp Ref. Segments Distance    ms ms V V  cm  R Sural - Ankle (Calf)     Calf Ankle 3.5 ?4.4 6 ?6 Calf - Ankle 14  L Sural - Ankle (Calf)     Calf Ankle 4.0 ?4.4 7 ?6 Calf - Ankle 14  R Superficial peroneal - Ankle     Lat leg Ankle 3.8 ?4.4 6 ?6 Lat leg - Ankle 14  L Superficial peroneal - Ankle     Lat leg Ankle 4.0 ?4.4 4 ?6 Lat leg - Ankle 14             F  Wave    Nerve F Lat Ref.   ms ms  R Tibial - AH 60.6 ?56.0  L Tibial - AH 64.0 ?56.0         EMG Summary  Table    Spontaneous MUAP Recruitment  Muscle IA Fib PSW Fasc Other Amp Dur. Poly Pattern  L. Iliopsoas Normal None None None _______ Normal Normal Normal Normal  L. Vastus medialis Normal None None None _______ Normal Normal Normal Normal  L. Tibialis anterior Decreased None None None _______ Normal Normal Normal reduced  L. Peroneus longus Decreased None None None _______ Normal Normal Normal Normal  L. Gastrocnemius (Medial head) Normal None None None _______ Normal Normal Normal Normal  L. Extensor hallucis longus Normal None None None _______ Normal Normal Normal Normal  L. Biceps femoris (long head) Normal None None None _______ Normal Normal Normal Normal  L. Gluteus maximus Normal None None None _______ Normal Normal Normal Normal  L. Gluteus medius Normal None None None _______ Normal Normal Normal Normal  L. Lumbar paraspinals (low) Normal None None None _______ Normal Normal Normal Normal

## 2019-06-25 NOTE — Progress Notes (Signed)
Leg pain and radicular symptoms. See procedure note

## 2019-06-29 NOTE — Procedures (Signed)
Full Name: Victoria [name] Gender: Female MRN #: [id number] Date of Birth: 1961-02-17    Visit Date: 06/25/2019 07:51 Age: 59 Years Examining Physician: Sarina Ill, MD  Referring Physician: Emeline General, MD Height: 5 feet 6 inch    History: Patient with numbness and tingling left leg from the knee to the top of the foot. She has also had low back pain and radicular symptoms in the past.   Summary: EMG/NCS performed on the left lower extremity.  The left peroneal motor nerve (recording from the EDB) showed a 17 m/s drop in conduction velocity across the fibular head (normal less than 10).  The left tibial motor nerve showed reduced amplitude (3.1 mV, normal greater than 4).  The left peroneal motor nerve (recording from the tib ant) showed reduced amplitude (1.2 mV, normal greater than 3).  The left superficial peroneal sensory nerve showed reduced amplitude (4 V, normal greater than 6).  The right tibial F wave showed delayed latency (60.6 ms, normal less than 56).  The left tibial F wave showed delayed latency (64.0 ms, normal less than 56).All remaining nerves (as indicated in the following tables) were within normal limits.  The left tibialis anterior muscle showed decreased insertional activity as well as diminished recruitment.  The left peroneus longus showed decreased insertional activity.  All remaining muscles (as indicated in the following tables) were within normal limits.    Conclusion: 1. 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  Distal peroneal-innervated muscles showed decreased insertional activity on emg needle examination likely due to atrophy.  2. Nerve conductions also indicated concomitant lumbosacral radiculopathy however emg needle exam did not show any acute/ongoing denervation in proximal muscles to suggest currently active syndrome.  3. No suggestion of peripheral polyneuropathy   Sarina Ill, M.D.  [facility] [place], [place] [address] Tel: [phone] Fax: [phone]  Verbal informed consent was obtained from the patient, patient was informed of potential risk of procedure, including bruising, bleeding, hematoma formation, infection, muscle weakness, muscle pain, numbness, among others.         [place]    Nerve / Sites Muscle Latency Ref. Amplitude Ref. Rel Amp Segments Distance Velocity Ref. Area    ms ms mV mV %  cm m/s m/s mVms  R Peroneal - EDB     Ankle EDB 3.9 ?6.5 6.4 ?2.0 100 Ankle - EDB 9   14.5     Fib head EDB 11.0  5.0  78.2 Fib head - Ankle 32 45 ?44 13.6     Pop fossa EDB 13.1  5.0  99.5 Pop fossa - Fib head 10 48 ?44 13.6         Pop fossa - Ankle      L Peroneal - EDB     Ankle EDB 4.1 ?6.5 4.4 ?2.0 100 Ankle - EDB 9   11.8     Fib head EDB 11.0  0.7  15.9 Fib head - Ankle 30 44 ?44 1.6     Pop fossa EDB 12.6  3.2  458 Pop fossa - Fib head 10 61 ?44 11.3         Pop fossa - Ankle      R Tibial - AH     Ankle AH 3.6 ?5.8 5.4 ?4.0 100 Ankle - AH 9   9.8     Pop fossa AH 13.8  3.0  56.3 Pop fossa -  Ankle 42 41 ?41 7.1  L Tibial - AH     Ankle AH 5.0 ?5.8 3.1 ?4.0 100 Ankle - AH 9   8.1     Pop fossa AH 14.2  2.6  82.2 Pop fossa - Ankle 40 43 ?41 6.7  L Peroneal - Tib Ant     Fib Head Tib Ant 4.4 ?6.7 1.2 ?3.0 100 Fib Head - Tib Ant 10   8.5     Pop fossa Tib Ant 6.0  3.0  251 Pop fossa - Fib Head 10 62 ?44 14.4               SNC    Nerve / Sites Rec. Site Peak Lat Ref.  Amp Ref. Segments Distance    ms ms V V  cm  R Sural - Ankle (Calf)     Calf Ankle 3.5 ?4.4 6 ?6 Calf - Ankle 14  L Sural - Ankle (Calf)     Calf Ankle 4.0 ?4.4 7 ?6 Calf - Ankle 14  R Superficial peroneal - Ankle     Lat leg Ankle 3.8 ?4.4 6 ?6 Lat leg - Ankle 14  L Superficial peroneal - Ankle     Lat leg Ankle 4.0 ?4.4 4 ?6 Lat leg - Ankle 14             F  Wave    Nerve F Lat Ref.   ms ms  R Tibial - AH 60.6 ?56.0  L Tibial - AH 64.0 ?56.0         EMG Summary  Table    Spontaneous MUAP Recruitment  Muscle IA Fib PSW Fasc Other Amp Dur. Poly Pattern  L. Iliopsoas Normal None None None _______ Normal Normal Normal Normal  L. Vastus medialis Normal None None None _______ Normal Normal Normal Normal  L. Tibialis anterior Decreased None None None _______ Normal Normal Normal reduced  L. Peroneus longus Decreased None None None _______ Normal Normal Normal Normal  L. Gastrocnemius (Medial head) Normal None None None _______ Normal Normal Normal Normal  L. Extensor hallucis longus Normal None None None _______ Normal Normal Normal Normal  L. Biceps femoris (long head) Normal None None None _______ Normal Normal Normal Normal  L. Gluteus maximus Normal None None None _______ Normal Normal Normal Normal  L. Gluteus medius Normal None None None _______ Normal Normal Normal Normal  L. Lumbar paraspinals (low) Normal None None None _______ Normal Normal Normal Normal

## 2019-06-30 NOTE — Telephone Encounter (Signed)
Called patient to see where she wants to go for 2nd opinion and she said she would talk with Dr Renold Genta about it on her next appointment.

## 2019-07-02 ENCOUNTER — Ambulatory Visit (INDEPENDENT_AMBULATORY_CARE_PROVIDER_SITE_OTHER): Payer: No Typology Code available for payment source | Admitting: Internal Medicine

## 2019-07-02 ENCOUNTER — Other Ambulatory Visit: Payer: Self-pay

## 2019-07-02 ENCOUNTER — Encounter: Payer: Self-pay | Admitting: Internal Medicine

## 2019-07-02 VITALS — BP 110/80 | HR 69 | Temp 98.4°F | Ht 66.0 in | Wt 266.0 lb

## 2019-07-02 DIAGNOSIS — E538 Deficiency of other specified B group vitamins: Secondary | ICD-10-CM

## 2019-07-02 DIAGNOSIS — G5732 Lesion of lateral popliteal nerve, left lower limb: Secondary | ICD-10-CM

## 2019-07-02 DIAGNOSIS — Z79899 Other long term (current) drug therapy: Secondary | ICD-10-CM

## 2019-07-02 DIAGNOSIS — Z0000 Encounter for general adult medical examination without abnormal findings: Secondary | ICD-10-CM

## 2019-07-02 DIAGNOSIS — Z96641 Presence of right artificial hip joint: Secondary | ICD-10-CM

## 2019-07-02 DIAGNOSIS — L732 Hidradenitis suppurativa: Secondary | ICD-10-CM

## 2019-07-02 DIAGNOSIS — Z79620 Long term (current) use of immunosuppressive biologic: Secondary | ICD-10-CM

## 2019-07-02 DIAGNOSIS — D708 Other neutropenia: Secondary | ICD-10-CM

## 2019-07-02 DIAGNOSIS — M1711 Unilateral primary osteoarthritis, right knee: Secondary | ICD-10-CM | POA: Diagnosis not present

## 2019-07-02 DIAGNOSIS — Z9884 Bariatric surgery status: Secondary | ICD-10-CM

## 2019-07-02 DIAGNOSIS — E039 Hypothyroidism, unspecified: Secondary | ICD-10-CM

## 2019-07-02 DIAGNOSIS — Z96652 Presence of left artificial knee joint: Secondary | ICD-10-CM

## 2019-07-02 DIAGNOSIS — M25562 Pain in left knee: Secondary | ICD-10-CM

## 2019-07-02 DIAGNOSIS — Z96642 Presence of left artificial hip joint: Secondary | ICD-10-CM

## 2019-07-02 DIAGNOSIS — R5383 Other fatigue: Secondary | ICD-10-CM

## 2019-07-02 DIAGNOSIS — Z8669 Personal history of other diseases of the nervous system and sense organs: Secondary | ICD-10-CM

## 2019-07-02 DIAGNOSIS — G8929 Other chronic pain: Secondary | ICD-10-CM

## 2019-07-02 MED ORDER — HYDROCODONE-ACETAMINOPHEN 10-325 MG PO TABS: 1.0000 | ORAL_TABLET | Freq: Four times a day (QID) | ORAL | 0 refills | Status: DC | PRN

## 2019-07-02 MED FILL — AMOXICILLIN 500 MG CAPSULE: 500 | 8 days supply | Qty: 24 | Fill #0

## 2019-07-02 MED FILL — HYDROCODON-APAP 10-325: 10-325 | 5 days supply | Qty: 20 | Fill #0

## 2019-07-02 MED FILL — IBUPROFEN 800 MG TAB: 800 | 3 days supply | Qty: 21 | Fill #0

## 2019-07-02 NOTE — Progress Notes (Signed)
Subjective:    Patient ID: Victoria [name], female    DOB: Jan 29, 1961, 59 y.o.   MRN: [id number]  HPI 59 year old Female for health maintenance exam and evaluation of medical issues.  Having toothache and is to start antibiotics per dentist. Might need root canal.  Having issues with right knee. Hx left knee arthroplasty.  Is on Humira for history of  hidradenitis suppurativa. Is seeing Dermatologist.  Continues to have issues with musculoskeletal pain.  In May 2019 she had an acute medial meniscal tear and underwent left knee arthroscopic surgery with meniscal debridement.  The knee never fully recovered.  In November she underwent total left knee arthroplasty.  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  Dr. Sabra Heck is GYN physician.  In December 2019 she presented to the emergency department with diarrhea and volume depletion.  She was treated with IV fluids and improved.  In March 2019 she had MRI of the brain for headaches.  Headaches could last up to 3 days per episode and was associated with some vertigo.  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  Headaches improved with conservative treatment.  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  No longer has this issue.  She had gastric bypass surgery Roux-en-Y laparoscopically by Dr. Hassell Done in 2016.  She weighed 296 pounds before surgery.  She had a carbuncle in her perianal area excised by Dr. Marcello Moores in August 2018.  Pathology showed this to be ruptured and inflamed epidermal inclusion cyst.  Has eye exam by Dr. Herbert Deaner.  Had colonoscopy by Dr. [name] in February 2019 with 10-year follow-up recommended.  History of B12 deficiency and gets her self monthly B12 injections.  Remote history of history of adenitis suppurativa.  She had thyroidectomy 1980 and is on thyroid replacement therapy.  Cholecystectomy  1987.  Remote history of adenomatous colon polyp.  C-section for twin daughters in 74.  Bilateral tubal ligation in the 1980s.  Total abdominal hysterectomy 2004.  Had left hip arthroplasty December 2010.  Right hip arthroplasty March 2014.  Endoscopy for epigastric pain 2012 by Dr. Maurene Capes.  Upper endoscopy in 2012 showed chronic duodenitis treated with PPI.  History of GE reflux.  History of recurrent vaginitis issues seen by GYN physician, Dr. Sabra Heck.  She saw Dr. Radford Pax in 2015 for chest pain.  2D echocardiogram was performed along with myocardial perfusion imaging which was normal.  In March 2018 she had an exercise tolerance test which was normal.  Social history: First marriage ended in divorce.  She has remarried.  Husband has back issues and is disabled.  Patient does not smoke or consume alcohol.  2 adult twin daughters.  1 daughter resides in [place].  Another daughter resides here locally and is a Education officer, museum in [place].  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  Family history: Mother living with history of diabetes and hypertension.  Father recently deceased with history of diabetes.  2 brothers and 1 sister.  Sister has hypothyroidism.  Bone density ordered by GYN physician showed normal scores.      Review of Systems  Constitutional: Positive for fatigue.  Respiratory: Negative.   Cardiovascular: Negative.   Musculoskeletal: Positive for arthralgias and myalgias.  Neurological:       History of headaches       Objective:   Physical Exam Vitals reviewed.  Constitutional:  Appearance: Normal appearance.  HENT:     Head: Normocephalic.     Right Ear: Tympanic membrane normal.     Left Ear: Tympanic membrane normal.  Eyes:     General: No scleral icterus.    Extraocular Movements: Extraocular movements intact.     Pupils: Pupils are equal, round, and reactive to light.  Cardiovascular:     Rate and Rhythm: Normal rate and  regular rhythm.     Heart sounds: Normal heart sounds. No murmur.  Pulmonary:     Effort: Pulmonary effort is normal.     Breath sounds: Normal breath sounds. No wheezing or rales.  Abdominal:     General: Bowel sounds are normal.     Palpations: Abdomen is soft.     Tenderness: There is no guarding or rebound.  Genitourinary:    Comments: Deferred to Dr. Sabra Heck Musculoskeletal:     Right lower leg: No edema.     Left lower leg: No edema.  Skin:    General: Skin is warm and dry.     Findings: No rash.  Neurological:     General: No focal deficit present.     Mental Status: She is alert.  Psychiatric:        Mood and Affect: Mood normal.        Behavior: Behavior normal.        Thought Content: Thought content normal.        Judgment: Judgment normal.           Assessment & Plan:  History of laparoscopic gastric bypass surgery by Dr. Hassell Done.  In February B12 level was 398.  Has been as high as 1079 in August 2020.  Make sure she is taking monthly B12 injections.  History of hidradenitis suppurativa  History of hypothyroidism status post thyroidectomy and TSH is stable on thyroid replacement  History of B12 deficiency-takes monthly B12 injections  History of headaches evaluated by Dr. Jaynee Eagles.  Treated with neuro check  GE reflux treated with PPI status post gastric bypass  Musculoskeletal pain-discussed situation at length and options.  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  History of vitamin D deficiency-this was not checked with recent labs  DMARD therapy for hidradenitis suppurativa  Anxiety and insomnia treated with Xanax  Fatigue- liver panel and TSH are normal-I think musculoskeletal pain attributing to the fatigue  Plan: We discussed options for dealing with  musculoskeletal pain.  Return in 6 months.

## 2019-07-09 ENCOUNTER — Telehealth: Payer: Self-pay | Admitting: *Deleted

## 2019-07-09 ENCOUNTER — Other Ambulatory Visit: Payer: Self-pay | Admitting: Internal Medicine

## 2019-07-09 NOTE — Telephone Encounter (Signed)
Received a request to renew PA for Nurtec. I called pt & LVM (ok per DPR) asking if the Nurtec has been working for her. I asked for either a call back or a mychart message.

## 2019-07-16 ENCOUNTER — Ambulatory Visit: Payer: Self-pay

## 2019-07-16 ENCOUNTER — Ambulatory Visit: Payer: No Typology Code available for payment source | Admitting: Orthopaedic Surgery

## 2019-07-16 ENCOUNTER — Encounter: Payer: Self-pay | Admitting: Orthopaedic Surgery

## 2019-07-16 ENCOUNTER — Other Ambulatory Visit: Payer: Self-pay

## 2019-07-16 ENCOUNTER — Ambulatory Visit (INDEPENDENT_AMBULATORY_CARE_PROVIDER_SITE_OTHER): Payer: No Typology Code available for payment source

## 2019-07-16 VITALS — Ht 66.5 in | Wt 264.0 lb

## 2019-07-16 DIAGNOSIS — Z96652 Presence of left artificial knee joint: Secondary | ICD-10-CM | POA: Insufficient documentation

## 2019-07-16 DIAGNOSIS — M25561 Pain in right knee: Secondary | ICD-10-CM | POA: Diagnosis not present

## 2019-07-16 DIAGNOSIS — G8929 Other chronic pain: Secondary | ICD-10-CM

## 2019-07-16 DIAGNOSIS — M25562 Pain in left knee: Secondary | ICD-10-CM

## 2019-07-16 MED ORDER — LIDOCAINE HCL 1 % IJ SOLN
3.0000 mL | INTRAMUSCULAR | Status: AC | PRN
  Administered 2019-07-16: 3 mL

## 2019-07-16 MED ORDER — METHYLPREDNISOLONE ACETATE 40 MG/ML IJ SUSP
40.0000 mg | INTRAMUSCULAR | Status: AC | PRN
  Administered 2019-07-16: 40 mg via INTRA_ARTICULAR

## 2019-07-16 NOTE — Progress Notes (Signed)
Office Visit Note   Patient: Victoria [name]           Date of Birth: 26-Jun-1960           MRN: [id number] Visit Date: 07/16/2019              Requested by: Elby Showers, MD [address] [place],  [place] [id number] PCP: Elby Showers, MD   Assessment & Plan: Visit Diagnoses:  1. Chronic pain of left knee   2. Chronic pain of right knee   3. History of total left knee replacement     Plan: I did show her knee replacement model.  I do feel that there may be a need to upsize the polyliner of her knee because she is feeling the symptoms she is feeling that may be more of an instability type of situation.  I would like to at least obtain a three-phase bone scan to rule out prosthetic loosening.  I did recommend a steroid injection in her right knee to see if this will temporize some of the right knee pain before considering a MRI on her right knee.  She agrees with this treatment plan and tolerated the injection well in her right knee.  We will order a three-phase bone scan for the left knee and then we will see her back in follow-up.  All questions and concerns were answered and addressed.  Follow-Up Instructions: Return in about 3 weeks (around 08/06/2019).   Orders:  Orders Placed This Encounter  Procedures  . Large Joint Inj  . XR Knee 1-2 Views Left  . XR Knee 1-2 Views Right   No orders of the defined types were placed in this encounter.     Procedures: Large Joint Inj: R knee on 07/16/2019 2:15 PM Indications: diagnostic evaluation and pain Details: 22 G 1.5 in needle, superolateral approach  Arthrogram: No  Medications: 3 mL lidocaine 1 %; 40 mg methylPREDNISolone acetate 40 MG/ML Outcome: tolerated well, no immediate complications Procedure, treatment alternatives, risks and benefits explained, specific risks discussed. Consent was given by the patient. Immediately prior to procedure a time out was called to verify the correct patient, procedure,  equipment, support staff and site/side marked as required. Patient was prepped and draped in the usual sterile fashion.       Clinical Data: No additional findings.   Subjective: Chief Complaint  Patient presents with  . Right Knee - Pain  . Left Knee - Pain  The patient is someone I am seeing for the first time.  Some of this is for second opinion.  She has had bilateral knee pain for years and in 2019 actually had a left total knee arthroplasty.  This was actually soon after having an arthroscopic intervention and she did had significant worsening of her arthritis.  The knee replacement was done by one of my colleagues in town who does a lot of knee replacements.  She says that the knee has a "pulling sensation" and she points to the lateral aspect of the knee and is just not been feeling right to her.  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  Her right knee has now been bothering her and is on the medial aspect of the knee.  She is someone with a BMI of 42.  She has gained weight as result of not being able  to exercise much given the problems she is having.  The knee pain has been waking her up at night.  She has had a recent MRI of her lumbar spine that was negative for any nerve compression.  HPI  Review of Systems She currently denies any headache, chest pain, shortness of breath, fever, chills, nausea, vomiting  Objective: Vital Signs: Ht 5' 6.5" (1.689 m)   Wt 264 lb (119.7 kg)   BMI 41.97 kg/m   Physical Exam She is alert and orient x3 and in no acute distress Ortho Exam Examination of her left knee still shows some slight valgus malalignment.  When I flex her knee to about 30 degrees I feel that there is a little bit of play in the knee and the same thing with a drawer sign there is a little bit of play in the left knee.  There is no effusion noted her incisions healed  nicely.  Her extension is full and her flexion is to about 100 degrees only limited I think mainly by her weight.  There is no evidence of infection.  Examination of her right knee shows a more neutral alignment.  There is medial joint line tenderness but no effusion.  Her range of motion is full.  Her right knee feels ligamentously stable. Specialty Comments:  No specialty comments available.  Imaging: XR Knee 1-2 Views Left  Result Date: 07/16/2019 2 views of the left knee show a total knee arthroplasty with no complicating features.  The alignment appears neutral on plain film.  There is no evidence of osteolysis or loosening.  XR Knee 1-2 Views Right  Result Date: 07/16/2019 2 views of the right knee show medial joint space narrowing but otherwise no acute findings.    PMFS History: Patient Active Problem List   Diagnosis Date Noted  . History of total left knee replacement 07/16/2019  . Peroneal neuropathy at knee, left 06/25/2019  . Lumbosacral radiculopathy 06/07/2019  . 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 IDA (iron deficiency anemia) 10/16/2018  . OA (osteoarthritis) of knee 01/20/2018  . Acute meniscal tear, medial 07/17/2017  . Failed total hip arthroplasty ([place]) 05/22/2017  . 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 Insomnia 11/08/2016  . Pernicious anemia 11/08/2016  . Leukopenia 07/09/2016  . Hypocupremia 07/09/2016  . Palpitations 04/12/2016  . 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 Lap gastric bypass May 2016 07/06/2014  . History of Clostridium difficile infection 08/12/2011  . Postoperative hypothyroidism 08/26/2010  . Gastroesophageal reflux disease 08/26/2010  . Vitamin D deficiency 08/26/2010  . Fibrocystic disease of breast 08/26/2010  . Cobalamin deficiency 08/26/2010  . Obesity 03/20/2010  . Backache 03/20/2010   Past Medical History:  Diagnosis Date  . Chronic leukopenia    intermittant since 2010,  followed by Dr. Renold Genta now  . Degenerative joint disease of low back 09/02/15    Dr. Maureen Ralphs  . Fibrocystic breast   . GERD (gastroesophageal reflux disease)   . Headache    per pt more stress/tension  . Heart palpitations    SEE EPIC [facility] , CARDIOLOGY DR. Tressia Miners TURNER 2018; reports on 05-16-17 " i haven't had any bouts of those lately"    . Hemorrhoids   . History of Clostridium difficile infection 12/2010  . History of exercise intolerance    ETT on 04-27-2016-- negative ([facility] treadmill score 7)  . History of Helicobacter pylori infection 2001 and 09/ 2012  . HSV-2 infection    genital  .  Hx of adenomatous colonic polyps 10/17/2007  . Hydradenitis    per pt currently treated with Humira injection  . Hypothyroidism, postsurgical 1980  . Osteoarthritis   . Pernicious anemia    b12 def  . PONV (postoperative nausea and vomiting)   . 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 Varicose veins   . Vitamin B 12 deficiency   . Vitamin D deficiency     Family History  Problem Relation Age of Onset  . Diabetes Father   . Diabetes Sister   . Hypertension Sister   . Hypertension Mother   . Headache Mother     Past Surgical History:  Procedure Laterality Date  . BREATH TEK H PYLORI N/A 05/03/2014   Procedure: BREATH TEK H PYLORI;  Surgeon: Kaylyn Lim, MD;  Location: [facility];  Service: General;  Laterality: N/A;  . CARDIOVASCULAR STRESS TEST  03/11/2013   dr Tressia Miners turner   Low risk nuclear study w/ a very small anterior perfusion defect that most likely represents breast attenuation artifact, less likely this could represent a true perfusion abnormality in the distribution of diagonal branch of the LAD/  normal LV function and wall motion , ef 66%  . CATARACT EXTRACTION W/ INTRAOCULAR LENS  IMPLANT, BILATERAL  10/2017  . CESAREAN SECTION  1985  . CHOLECYSTECTOMY OPEN  1987  . COLONOSCOPY  02/2017    Dr [name] ; per patient they found 7 polyps with polypectomy; on 5 year track   . FINE NEEDLE ASPIRATION Left 05/22/2017   Procedure: LEFT KNEE ASPIRATION;  Surgeon: Gaynelle Arabian, MD;  Location: [facility];  Service: Orthopedics;  Laterality: Left;  Marland Kitchen GASTRIC ROUX-EN-Y N/A 07/06/2014   Procedure: LAPAROSCOPIC ROUX-EN-Y GASTRIC BYPASS, ENTEROLYSIS OF ADHESIONS WITH UPPER ENDOSCOPY;  Surgeon: Johnathan Hausen, MD;  Location: [facility];  Service: General;  Laterality: N/A;  . HAMMER TOE SURGERY Bilateral 02/17/2008   bilateral foot digit's 2 and 5  . HIATAL HERNIA REPAIR N/A 07/06/2014   Procedure: LAPAROSCOPIC REPAIR OF HIATAL HERNIA;  Surgeon: Johnathan Hausen, MD;  Location: [facility];  Service: General;  Laterality: N/A;  . KNEE ARTHROSCOPY WITH MEDIAL MENISECTOMY Left 07/17/2017   Procedure: LEFT KNEE ARTHROSCOPY WITH MEDIAL LATERAL MENISECTOMY;  Surgeon: Gaynelle Arabian, MD;  Location: [facility];  Service: Orthopedics;  Laterality: Left;  Marland Kitchen MASS EXCISION N/A 10/11/2016   Procedure: EXCISION OF PERIANAL MASS;  Surgeon: [name], MD;  Location: [facility];  Service: General;  Laterality: N/A;  . REFRACTIVE SURGERY    . THYROIDECTOMY  1980  . TOTAL ABDOMINAL HYSTERECTOMY  08-25-2002   dr Margaretha Glassing   ovaries retained  . TOTAL HIP ARTHROPLASTY Right 05/21/2012   Procedure: TOTAL HIP ARTHROPLASTY;  Surgeon: Gearlean Alf, MD;  Location: [facility];  Service: Orthopedics;  Laterality: Right;  . TOTAL HIP ARTHROPLASTY Left 01-31-2009  dr Wynelle Link  . TOTAL HIP REVISION Left 05/22/2017   Procedure: Left hip bearing surface and head revision;  Surgeon: Gaynelle Arabian, MD;  Location: [facility];  Service: Orthopedics;  Laterality: Left;  . TOTAL KNEE ARTHROPLASTY Left 01/20/2018   Procedure: LEFT TOTAL KNEE ARTHROPLASTY;  Surgeon: Gaynelle Arabian, MD;  Location: [facility];  Service: Orthopedics;  Laterality: Left;  54min  . TRANSTHORACIC ECHOCARDIOGRAM  05/03/2016   ef 55-60%/  trivial MR/  mild TR  . TUBAL  LIGATION     Social History   Occupational History  .  Occupation: nurse  Tobacco Use  . Smoking status: Never Smoker  . Smokeless tobacco: Never Used  Substance and Sexual Activity  . Alcohol use: No  . Drug use: No  . Sexual activity: Not Currently    Partners: Male    Birth control/protection: Surgical

## 2019-07-17 ENCOUNTER — Other Ambulatory Visit: Payer: Self-pay

## 2019-07-17 DIAGNOSIS — Z96652 Presence of left artificial knee joint: Secondary | ICD-10-CM

## 2019-07-20 ENCOUNTER — Other Ambulatory Visit: Payer: Self-pay | Admitting: Pharmacist

## 2019-07-20 MED ORDER — HUMIRA PEN 40 MG/0.8ML ~~LOC~~ PNKT: PEN_INJECTOR | SUBCUTANEOUS | 5 refills | Status: DC

## 2019-07-20 MED FILL — HUMIRA PEN 40 MG/0.8ML PNKT: 40 | 28 days supply | Qty: 4 | Fill #0

## 2019-07-27 NOTE — Patient Instructions (Signed)
Options discussed for dealing with musculoskeletal pain and peroneal neuropathy.  Continue current medications.  Follow-up in 6 months.

## 2019-07-30 NOTE — Telephone Encounter (Signed)
The request has been approved. The authorization is effective for a maximum of 12 fills from 07/30/2019 to 07/28/2020, as long as the member is enrolled in their current health plan. This has been approved for a quantity limit of 16.0 with a day supply limit of 30.0. A written notification letter will follow with additional details.

## 2019-07-30 NOTE — Telephone Encounter (Signed)
Completed renewal PA on Cover My Meds for [employer]. [id number]. Awaiting determination from [place].

## 2019-07-31 ENCOUNTER — Encounter (HOSPITAL_COMMUNITY)
Admission: RE | Admit: 2019-07-31 | Discharge: 2019-07-31 | Disposition: A | Discharge status: Home / Self Care | Payer: No Typology Code available for payment source | Source: Ambulatory Visit | Attending: Orthopaedic Surgery | Admitting: Orthopaedic Surgery

## 2019-07-31 ENCOUNTER — Other Ambulatory Visit: Payer: Self-pay

## 2019-07-31 DIAGNOSIS — Z96652 Presence of left artificial knee joint: Secondary | ICD-10-CM | POA: Insufficient documentation

## 2019-07-31 MED ORDER — TECHNETIUM TC 99M MEDRONATE IV KIT
20.0000 | PACK | Freq: Once | INTRAVENOUS | Status: AC | PRN
  Administered 2019-07-31: 20 via INTRAVENOUS

## 2019-08-06 ENCOUNTER — Ambulatory Visit (INDEPENDENT_AMBULATORY_CARE_PROVIDER_SITE_OTHER): Payer: No Typology Code available for payment source | Admitting: Orthopaedic Surgery

## 2019-08-06 ENCOUNTER — Encounter: Payer: Self-pay | Admitting: Orthopaedic Surgery

## 2019-08-06 DIAGNOSIS — M25562 Pain in left knee: Secondary | ICD-10-CM

## 2019-08-06 DIAGNOSIS — M25561 Pain in right knee: Secondary | ICD-10-CM | POA: Diagnosis not present

## 2019-08-06 DIAGNOSIS — G8929 Other chronic pain: Secondary | ICD-10-CM | POA: Diagnosis not present

## 2019-08-06 DIAGNOSIS — Z96652 Presence of left artificial knee joint: Secondary | ICD-10-CM

## 2019-08-06 NOTE — Progress Notes (Signed)
The patient comes in today for 2 different things.  One is for her left knee.  She had a knee replacement in 2019 by one of my colleagues in town.  She continues to have symptoms of pain and instability with that knee.  We did obtain a three-phase bone scan to rule out prosthetic loosening.  She has been dealing also with right knee pain.  Is been along the medial and lateral joint lines.  She has locking and catching with that knee and is concerned about a meniscal tear.  We did provide a steroid injection at her last visit in the right knee the hoping it would calm down her symptoms.  Her symptoms mechanically are getting worse in terms of locking catching and pain with the right knee.  On examination of the right knee there is valgus malalignment.  There is pain along the medial lateral joint lines.  There is also a positiveMc Murray sign to the lateral compartment and the medial compartment of her knee.  Examination of the left knee shows that she can flex to only about 95 to 100 degrees.  She has full extension.  On varus and valgus stressing of the left total knee there is some instability.  There is also some instability on drawer sign on the left knee.  Three-phase bone scan did not show any evidence of prosthetic loosening on the left knee.  At this point I have recommended upsizing polyethylene liner on her left knee given the instability symptoms.  She would like to obtain an MRI of her right knee given the continued locking and catching in the failed conservative treatment with activity modification, quad strengthening exercises, time, and even steroid injections.  She still has severe right knee pain.  With the continued mechanical symptoms especially pivoting activities with the right knee there is concern for meniscal tear.  A MRI would be helpful to rule out a meniscal tear and to assess any degree of cartilage off of the right knee.  We will work on ordering that MRI of the right knee and see  her back in 2 weeks for follow-up.  All questions and concerns were answered and addressed.

## 2019-08-07 ENCOUNTER — Encounter: Payer: Self-pay | Admitting: Orthopaedic Surgery

## 2019-08-07 ENCOUNTER — Other Ambulatory Visit: Payer: Self-pay | Admitting: Orthopaedic Surgery

## 2019-08-07 ENCOUNTER — Other Ambulatory Visit: Payer: Self-pay

## 2019-08-07 DIAGNOSIS — M25561 Pain in right knee: Secondary | ICD-10-CM

## 2019-08-07 DIAGNOSIS — G8929 Other chronic pain: Secondary | ICD-10-CM

## 2019-08-07 MED ORDER — ACETAMINOPHEN-CODEINE #3 300-30 MG PO TABS: 1.0000 | ORAL_TABLET | Freq: Three times a day (TID) | ORAL | 0 refills | Status: DC | PRN

## 2019-08-11 ENCOUNTER — Telehealth: Payer: Self-pay | Admitting: Orthopaedic Surgery

## 2019-08-11 ENCOUNTER — Encounter: Payer: Self-pay | Admitting: Orthopaedic Surgery

## 2019-08-11 MED FILL — HUMIRA PEN 40 MG/0.8ML PNKT: 40 | 28 days supply | Qty: 4 | Fill #1

## 2019-08-11 NOTE — Telephone Encounter (Signed)
Patient called requesting to be seen at a different facility for MRI. Patient states [facility] can't see her for a month. Patient is requesting a call back at [phone]

## 2019-08-11 NOTE — Telephone Encounter (Signed)
Can we make this happen?

## 2019-08-12 NOTE — Telephone Encounter (Signed)
Pt is being scheduled at [facility] this weekend

## 2019-08-15 ENCOUNTER — Ambulatory Visit (HOSPITAL_BASED_OUTPATIENT_CLINIC_OR_DEPARTMENT_OTHER)
Admission: RE | Admit: 2019-08-15 | Discharge: 2019-08-15 | Disposition: A | Discharge status: Home / Self Care | Payer: No Typology Code available for payment source | Source: Ambulatory Visit | Attending: Orthopaedic Surgery | Admitting: Orthopaedic Surgery

## 2019-08-15 ENCOUNTER — Other Ambulatory Visit: Payer: Self-pay

## 2019-08-15 DIAGNOSIS — G8929 Other chronic pain: Secondary | ICD-10-CM | POA: Diagnosis present

## 2019-08-15 DIAGNOSIS — M25561 Pain in right knee: Secondary | ICD-10-CM | POA: Diagnosis present

## 2019-08-27 ENCOUNTER — Ambulatory Visit (INDEPENDENT_AMBULATORY_CARE_PROVIDER_SITE_OTHER): Payer: No Typology Code available for payment source | Admitting: Orthopaedic Surgery

## 2019-08-27 ENCOUNTER — Other Ambulatory Visit: Payer: Self-pay

## 2019-08-27 DIAGNOSIS — T84063D Wear of articular bearing surface of internal prosthetic left knee joint, subsequent encounter: Secondary | ICD-10-CM | POA: Diagnosis not present

## 2019-08-27 DIAGNOSIS — M25561 Pain in right knee: Secondary | ICD-10-CM

## 2019-08-27 DIAGNOSIS — S83241D Other tear of medial meniscus, current injury, right knee, subsequent encounter: Secondary | ICD-10-CM | POA: Insufficient documentation

## 2019-08-27 DIAGNOSIS — Z96652 Presence of left artificial knee joint: Secondary | ICD-10-CM

## 2019-08-27 NOTE — Progress Notes (Signed)
Thanks so much. Sounds like a good plan.

## 2019-08-27 NOTE — Progress Notes (Signed)
The patient is well-known to me.  She is coming in today to go over a MRI of her right knee.  She has been having locking and catching and swelling in the right knee.  She also has a history of a left total knee arthroplasty done by one of our colleagues in town in 2019.  On my exam that knee has old enough play in it that I feel like she needs upsizing of her polyethylene liner.  She says the knee does feel unstable to her.  Her right knee does lock and catch and it did warrant a MRI given the failure of conservative treatment including activity modification, anti-inflammatories and a steroid injection.  On examination of her left total knee arthroplasty, there is some slight instability with varus valgus stressing and with stressing from anterior to posterior.  This is indicative of needing an upsize of her poly-.  Her right knee shows significant medial joint line tenderness.  There is a moderate joint effusion.  She has a positive Murray sign to the medial compartment.  MRI of her right knee shows only mild cartilage thinning throughout the knee mainly at the medial compartment the knee.  There is a significant oblique tear of the meniscus with a flap component that is definitely acute.  At this point I am recommending a right knee arthroscopy with a partial medial meniscectomy.  I feel that she would benefit from having the polyexchange on her left knee as well.  She agrees with this assessment given the fact that she is having issues with both her knees.  She did inquire about having the surgery done at the same time and I agree with this as well.  We could perform a left knee polyliner exchange with upsizing it at least 1-2 sizes as well as a low right knee arthroscopy with partial medial meniscectomy.  I explained in detail with both the surgery involves.  We talked about the risk and benefits of surgery.  I would definitely do this in an inpatient setting so I can just keep her overnight for  observation as well as for working with therapy with mobility.  I described in detail what the surgery involves and the risk and benefits of surgery.  I talked about her interoperative and postoperative course.  She has our surgery scheduler's card because she would like to talk to her husband about having this scheduled sometime in the near future.  I would likely need to have her out of work about 4 to 6 weeks afterwards.  All questions and concerns were answered and addressed.

## 2019-09-02 ENCOUNTER — Encounter: Payer: Self-pay | Admitting: Orthopaedic Surgery

## 2019-09-08 ENCOUNTER — Telehealth: Payer: Self-pay

## 2019-09-08 ENCOUNTER — Other Ambulatory Visit: Payer: Self-pay | Admitting: Internal Medicine

## 2019-09-08 MED FILL — HUMIRA PEN 40 MG/0.8ML PNKT: 40 | 28 days supply | Qty: 4 | Fill #2

## 2019-09-08 MED FILL — ALPRAZolam 1 MG TABS: 1 | 30 days supply | Qty: 30 | Fill #2

## 2019-09-08 MED FILL — CYCLOBENZAPRINE HCL 10 MG T: 10 | 90 days supply | Qty: 90 | Fill #0

## 2019-09-08 MED FILL — PROMETHAZINE 25 MG TABLET: 25 | 10 days supply | Qty: 30 | Fill #0

## 2019-09-08 MED FILL — GABAPENTIN 600 MG TABLET: 600 | 30 days supply | Qty: 90 | Fill #1

## 2019-09-08 MED FILL — CYANOCOBALAMIN 1,000 MCG/ML: 1000 | 90 days supply | Qty: 3 | Fill #0

## 2019-09-08 NOTE — Telephone Encounter (Signed)
Patient wants to proceed with surgery.  Please write order.  Thanks!

## 2019-09-09 DIAGNOSIS — T84063A Wear of articular bearing surface of internal prosthetic left knee joint, initial encounter: Secondary | ICD-10-CM | POA: Insufficient documentation

## 2019-09-09 NOTE — Telephone Encounter (Signed)
Per our conversation, I understood she wanted to do both at the same time.

## 2019-09-09 NOTE — Telephone Encounter (Signed)
Can you find out what surgery she actually wants.  I had spoken with her about a right knee arthroscopy for partial medial meniscectomy.  We have also talked about a polyliner exchange of her left knee.  I am even fine with doing both at the same time

## 2019-09-11 ENCOUNTER — Other Ambulatory Visit: Payer: No Typology Code available for payment source

## 2019-09-14 ENCOUNTER — Other Ambulatory Visit: Payer: Self-pay | Admitting: Internal Medicine

## 2019-09-14 MED FILL — LEVOTHYROXINE SODIUM 112 MC: 112 | 90 days supply | Qty: 90 | Fill #0

## 2019-09-15 ENCOUNTER — Encounter: Payer: Self-pay | Admitting: Orthopaedic Surgery

## 2019-09-18 MED FILL — NURTEC 75 MG TBDP: 75 | 15 days supply | Qty: 8 | Fill #2

## 2019-09-29 ENCOUNTER — Encounter: Payer: Self-pay | Admitting: Orthopaedic Surgery

## 2019-10-01 ENCOUNTER — Encounter: Payer: Self-pay | Admitting: Physician Assistant

## 2019-10-01 ENCOUNTER — Ambulatory Visit: Payer: Self-pay

## 2019-10-01 ENCOUNTER — Ambulatory Visit (INDEPENDENT_AMBULATORY_CARE_PROVIDER_SITE_OTHER): Payer: No Typology Code available for payment source

## 2019-10-01 ENCOUNTER — Ambulatory Visit (INDEPENDENT_AMBULATORY_CARE_PROVIDER_SITE_OTHER): Payer: No Typology Code available for payment source | Admitting: Physician Assistant

## 2019-10-01 DIAGNOSIS — M25561 Pain in right knee: Secondary | ICD-10-CM

## 2019-10-01 DIAGNOSIS — G8929 Other chronic pain: Secondary | ICD-10-CM

## 2019-10-01 DIAGNOSIS — M25511 Pain in right shoulder: Secondary | ICD-10-CM

## 2019-10-01 MED ORDER — LIDOCAINE HCL 1 % IJ SOLN
3.0000 mL | INTRAMUSCULAR | Status: AC | PRN
  Administered 2019-10-01: 3 mL

## 2019-10-01 MED ORDER — METHYLPREDNISOLONE ACETATE 40 MG/ML IJ SUSP
40.0000 mg | INTRAMUSCULAR | Status: AC | PRN
  Administered 2019-10-01: 40 mg via INTRA_ARTICULAR

## 2019-10-01 NOTE — Progress Notes (Signed)
Office Visit Note   Patient: Victoria [name]           Date of Birth: 1961/01/04           MRN: [id number] Visit Date: 10/01/2019              Requested by: Elby Showers, MD [address] [place],  [place] [id number] PCP: Elby Showers, MD   Assessment & Plan: Visit Diagnoses:  1. Acute pain of right knee   2. Chronic pain of right knee   3. Acute pain of right shoulder     Plan: We will see her back in 2 weeks to see what type of response she had to the right shoulder subacromial injection.  She is given wall crawls, Codman, pendulum, and forward flexion exercises perform her shoulder.  Regards to her right knee reassurance was given that there is no advancement of the arthritis on plain films to suggest that she would benefit from a total knee versus a knee arthroscopy for the meniscal tear.  Questions are encouraged and answered at length.  Follow-Up Instructions: Return in about 2 weeks (around 10/15/2019).   Orders:  Orders Placed This Encounter  Procedures  . Large Joint Inj: R subacromial bursa  . XR Shoulder Right  . XR Knee 1-2 Views Right   No orders of the defined types were placed in this encounter.     Procedures: Large Joint Inj: R subacromial bursa on 10/01/2019 6:39 PM Indications: pain Details: 22 G 1.5 in needle, superior approach  Arthrogram: No  Medications: 3 mL lidocaine 1 %; 40 mg methylPREDNISolone acetate 40 MG/ML Outcome: tolerated well, no immediate complications Procedure, treatment alternatives, risks and benefits explained, specific risks discussed. Consent was given by the patient. Immediately prior to procedure a time out was called to verify the correct patient, procedure, equipment, support staff and site/side marked as required. Patient was prepped and draped in the usual sterile fashion.       Clinical Data: No additional findings.   Subjective: Chief Complaint  Patient presents with  . Right Shoulder - Pain   . Right Knee - Pain    HPI Victoria [name] comes in today for 2 complaints right knee pain and right shoulder pain.  She has known meniscal tear of the right knee.  However she wants to make sure before having knee arthroscopy for the meniscal tear that her arthritis is not advanced that she is having more pain lateral aspect of the right knee.  She said no new injury to the right knee.  She is also having right shoulder pain is achy dull pain.  She does note some occasional numbness and tingling involving her ring and fifth finger on the right hand but no real radicular symptoms down the arm.  She denies any neck pain.  Pain in the shoulder can be sharp at times.  She is trying ibuprofen and some leftover Vicodin without any real relief. Review of Systems See HPI otherwise negative  Objective: Vital Signs: There were no vitals taken for this visit.  Physical Exam General: Well-developed well-nourished pleasant female in no acute distress Psych: Alert and oriented x3 Ortho Exam Right shoulder: 5 out of 5 strength with external/internal rotation against resistance.  Positive abduction test on the right.  Impingement testing positive on the right negative on the left.  Liftoff test negative bilaterally.  She has full motor full sensation bilateral hands.  Right knee no abnormal warmth erythema  or effusion.  Overall good range of motion of the knee. Specialty Comments:  No specialty comments available.  Imaging: XR Knee 1-2 Views Right  Result Date: 10/01/2019 Right knee AP lateral view shows moderate narrowing medial joint line.  Mild patellofemoral changes.  Lateral joint lines well-maintained.  No acute fractures.  Knee is well located.  XR Shoulder Right  Result Date: 10/01/2019 Right shoulder 3 views: Shoulders well located.  No acute fractures.  Glenohumeral joint is well-maintained.  Subacromial space well-maintained.    PMFS History: Patient Active Problem List   Diagnosis Date  Noted  . 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 Acute medial meniscal tear, right, subsequent encounter 08/27/2019  . History of total left knee replacement 07/16/2019  . Peroneal neuropathy at knee, left 06/25/2019  . Lumbosacral radiculopathy 06/07/2019  . 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 IDA (iron deficiency anemia) 10/16/2018  . OA (osteoarthritis) of knee 01/20/2018  . Acute meniscal tear, medial 07/17/2017  . 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 Insomnia 11/08/2016  . Pernicious anemia 11/08/2016  . Leukopenia 07/09/2016  . Hypocupremia 07/09/2016  . Palpitations 04/12/2016  . 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 Lap gastric bypass May 2016 07/06/2014  . History of Clostridium difficile infection 08/12/2011  . Postoperative hypothyroidism 08/26/2010  . Gastroesophageal reflux disease 08/26/2010  . Vitamin D deficiency 08/26/2010  . Fibrocystic disease of breast 08/26/2010  . Cobalamin deficiency 08/26/2010  . Obesity 03/20/2010  . Backache 03/20/2010   Past Medical History:  Diagnosis Date  . Chronic leukopenia    intermittant since 2010, followed by Dr. Renold Genta now  . Degenerative joint disease of low back 09/02/15    Dr. Maureen Ralphs  . Fibrocystic breast   . GERD (gastroesophageal reflux disease)   . Headache    per pt more stress/tension  . Heart palpitations    SEE EPIC ENCOUTNER , CARDIOLOGY DR. Tressia Miners TURNER 2018; reports on 05-16-17 " i haven't had any bouts of those lately"    . Hemorrhoids   . History of Clostridium difficile infection 12/2010  . History of exercise intolerance    ETT on 04-27-2016-- negative ([facility] treadmill score 7)  . History of Helicobacter pylori infection 2001 and 09/ 2012  . HSV-2 infection    genital  . Hx of adenomatous colonic polyps 10/17/2007  . Hydradenitis    per pt currently treated with Humira  injection  . Hypothyroidism, postsurgical 1980  . Osteoarthritis   . Pernicious anemia    b12 def  . PONV (postoperative nausea and vomiting)   . 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 Varicose veins   . Vitamin B 12 deficiency   . Vitamin D deficiency     Family History  Problem Relation Age of Onset  . Diabetes Father   . Diabetes Sister   . Hypertension Sister   . Hypertension Mother   . Headache Mother     Past Surgical History:  Procedure Laterality Date  . BREATH TEK H PYLORI N/A 05/03/2014   Procedure: BREATH TEK H PYLORI;  Surgeon: Kaylyn Lim, MD;  Location: [facility];  Service: General;  Laterality: N/A;  . CARDIOVASCULAR STRESS TEST  03/11/2013   dr Tressia Miners turner   Low risk nuclear study w/ a very small anterior perfusion defect that most likely represents  breast attenuation artifact, less likely this could represent a true perfusion abnormality in the distribution of diagonal branch of the LAD/  normal LV function and wall motion , ef 66%  . CATARACT EXTRACTION W/ INTRAOCULAR LENS  IMPLANT, BILATERAL  10/2017  . CESAREAN SECTION  1985  . CHOLECYSTECTOMY OPEN  1987  . COLONOSCOPY  02/2017   Dr [name] ; per patient they found 7 polyps with polypectomy; on 5 year track   . FINE NEEDLE ASPIRATION Left 05/22/2017   Procedure: LEFT KNEE ASPIRATION;  Surgeon: Gaynelle Arabian, MD;  Location: [facility];  Service: Orthopedics;  Laterality: Left;  Marland Kitchen GASTRIC ROUX-EN-Y N/A 07/06/2014   Procedure: LAPAROSCOPIC ROUX-EN-Y GASTRIC BYPASS, ENTEROLYSIS OF ADHESIONS WITH UPPER ENDOSCOPY;  Surgeon: Johnathan Hausen, MD;  Location: [facility];  Service: General;  Laterality: N/A;  . HAMMER TOE SURGERY Bilateral 02/17/2008   bilateral foot digit's 2 and 5  . HIATAL HERNIA REPAIR N/A 07/06/2014   Procedure: LAPAROSCOPIC REPAIR OF HIATAL HERNIA;  Surgeon: Johnathan Hausen, MD;  Location: [facility];  Service: General;   Laterality: N/A;  . KNEE ARTHROSCOPY WITH MEDIAL MENISECTOMY Left 07/17/2017   Procedure: LEFT KNEE ARTHROSCOPY WITH MEDIAL LATERAL MENISECTOMY;  Surgeon: Gaynelle Arabian, MD;  Location: [facility];  Service: Orthopedics;  Laterality: Left;  Marland Kitchen MASS EXCISION N/A 10/11/2016   Procedure: EXCISION OF PERIANAL MASS;  Surgeon: [name], MD;  Location: [facility];  Service: General;  Laterality: N/A;  . REFRACTIVE SURGERY    . THYROIDECTOMY  1980  . TOTAL ABDOMINAL HYSTERECTOMY  08-25-2002   dr Margaretha Glassing   ovaries retained  . TOTAL HIP ARTHROPLASTY Right 05/21/2012   Procedure: TOTAL HIP ARTHROPLASTY;  Surgeon: Gearlean Alf, MD;  Location: [facility];  Service: Orthopedics;  Laterality: Right;  . TOTAL HIP ARTHROPLASTY Left 01-31-2009  dr Wynelle Link  . TOTAL HIP REVISION Left 05/22/2017   Procedure: Left hip bearing surface and head revision;  Surgeon: Gaynelle Arabian, MD;  Location: [facility];  Service: Orthopedics;  Laterality: Left;  . TOTAL KNEE ARTHROPLASTY Left 01/20/2018   Procedure: LEFT TOTAL KNEE ARTHROPLASTY;  Surgeon: Gaynelle Arabian, MD;  Location: [facility];  Service: Orthopedics;  Laterality: Left;  33min  . TRANSTHORACIC ECHOCARDIOGRAM  05/03/2016   ef 55-60%/  trivial MR/  mild TR  . TUBAL LIGATION     Social History   Occupational History  . Occupation: nurse  Tobacco Use  . Smoking status: Never Smoker  . Smokeless tobacco: Never Used  Vaping Use  . Vaping Use: Never used  Substance and Sexual Activity  . Alcohol use: No  . Drug use: No  . Sexual activity: Not Currently    Partners: Male    Birth control/protection: Surgical

## 2019-10-07 MED FILL — HUMIRA PEN 40 MG/0.8ML PNKT: 40 | 28 days supply | Qty: 4 | Fill #3

## 2019-10-13 ENCOUNTER — Other Ambulatory Visit: Payer: Self-pay | Admitting: Family

## 2019-10-15 ENCOUNTER — Ambulatory Visit (INDEPENDENT_AMBULATORY_CARE_PROVIDER_SITE_OTHER): Payer: No Typology Code available for payment source | Admitting: Physician Assistant

## 2019-10-15 ENCOUNTER — Other Ambulatory Visit: Payer: Self-pay | Admitting: Physician Assistant

## 2019-10-15 ENCOUNTER — Encounter: Payer: Self-pay | Admitting: Physician Assistant

## 2019-10-15 DIAGNOSIS — M25511 Pain in right shoulder: Secondary | ICD-10-CM | POA: Diagnosis not present

## 2019-10-15 MED ORDER — HYDROCODONE-ACETAMINOPHEN 5-325 MG PO TABS: 1.0000 | ORAL_TABLET | Freq: Four times a day (QID) | ORAL | 0 refills | Status: DC | PRN

## 2019-10-15 NOTE — Progress Notes (Signed)
HPI: Ms. Babino returns today follow-up of her right shoulder status post injection.  She states shoulder was getting better until yesterday now having pain off and on and decreased range of motion.  She states sometimes shoulder feels as if it catches when she is lifting.  Review of systems see HPI otherwise negative  Physical exam: General: Well-developed well-nourished female no acute distress mood and affect appropriate. Psych: Alert and oriented x3. Bilateral shoulders 5 out of 5 strength with external and internal rotation against resistance.  Positive impingement on the right shoulder negative on the left.  Lift off test is positive on the right negative on the left.  Empty can test is negative bilaterally.  Impression: Right shoulder impingement Differential possible rotator cuff tear  Plan: Due to the fact the patient is failed conservative treatment which is included home exercise and injection recommend MRI given her clinical findings today with positive liftoff test on the right and positive impingement testing.  We will obtain the MRI and then have her follow-up to go over the results and discuss further treatment.  Questions encouraged and answered at length.

## 2019-10-16 MED FILL — HYDROCODON-APAP 5-325: 5-325 | 4 days supply | Qty: 25 | Fill #0

## 2019-10-19 ENCOUNTER — Other Ambulatory Visit: Payer: Self-pay | Admitting: Radiology

## 2019-10-19 ENCOUNTER — Other Ambulatory Visit: Payer: No Typology Code available for payment source

## 2019-10-19 ENCOUNTER — Ambulatory Visit: Payer: No Typology Code available for payment source | Admitting: Family

## 2019-10-19 DIAGNOSIS — M25511 Pain in right shoulder: Secondary | ICD-10-CM

## 2019-10-20 ENCOUNTER — Other Ambulatory Visit: Payer: Self-pay | Admitting: Internal Medicine

## 2019-10-20 MED FILL — DIVIGEL 1 MG GEL PACKET: 1 | 90 days supply | Qty: 90 | Fill #1

## 2019-10-20 MED FILL — ALPRAZOLAM 1 MG TABS: 1 | 30 days supply | Qty: 30 | Fill #0

## 2019-10-24 ENCOUNTER — Other Ambulatory Visit: Payer: Self-pay

## 2019-10-24 ENCOUNTER — Ambulatory Visit (HOSPITAL_BASED_OUTPATIENT_CLINIC_OR_DEPARTMENT_OTHER)
Admission: RE | Admit: 2019-10-24 | Discharge: 2019-10-24 | Disposition: A | Discharge status: Home / Self Care | Payer: No Typology Code available for payment source | Source: Ambulatory Visit | Attending: Physician Assistant | Admitting: Physician Assistant

## 2019-10-24 DIAGNOSIS — M25511 Pain in right shoulder: Secondary | ICD-10-CM | POA: Insufficient documentation

## 2019-10-28 ENCOUNTER — Encounter: Payer: Self-pay | Admitting: Physician Assistant

## 2019-10-28 ENCOUNTER — Ambulatory Visit (INDEPENDENT_AMBULATORY_CARE_PROVIDER_SITE_OTHER): Payer: No Typology Code available for payment source | Admitting: Physician Assistant

## 2019-10-28 ENCOUNTER — Other Ambulatory Visit: Payer: Self-pay

## 2019-10-28 DIAGNOSIS — M25511 Pain in right shoulder: Secondary | ICD-10-CM | POA: Diagnosis not present

## 2019-10-28 DIAGNOSIS — M7541 Impingement syndrome of right shoulder: Secondary | ICD-10-CM

## 2019-10-28 NOTE — Progress Notes (Signed)
Victoria [name] comes in today to go over the MRI of her right shoulder.  She continues to have pain in the right shoulder.  She has failed conservative treatment which is included home exercise program and 1 subacromial injection. The MRI dated 10/25/2019 is reviewed with the patient.  This shows no rotator cuff tear.  Mild supraspinatus tendinosis and moderate distal infraspinatus tendinosis.  Os acromiale is noted.  Significant bursitis of the hip noted.  And small to moderate glenohumeral joint effusion.  Glenohumeral joint otherwise is is well-preserved.   Impression: Right shoulder impingement.   Plan: Patient has known meniscal tear of her right knee and needs a knee arthroscopy on the right side.  She also needs a polyexchange with upsize of her polyethylene liner of the left total knee.  She states that these are more painful and pressing therefore she would like to proceed.  With these in in the near future.  However she understands with Covid restrictions that she would have to go home the same day and does not feel that she would be able to go home the same day after knee arthroscopy and polyexchange. In regards to her right shoulder discussed possible treatments these include shoulder arthroscopy with extensive debridement, physical therapy or repeat subacromial injection.  However would not recommend the repeat injection until November whenever it is been at least 3 months since injection.  She will let us know how she would like to proceed with this in the near future.  Did have hearing fillings scheduler talk to her at length about the scheduling process for her knees.

## 2019-10-30 ENCOUNTER — Inpatient Hospital Stay (HOSPITAL_BASED_OUTPATIENT_CLINIC_OR_DEPARTMENT_OTHER): Payer: No Typology Code available for payment source | Admitting: Family

## 2019-10-30 ENCOUNTER — Encounter: Payer: Self-pay | Admitting: Family

## 2019-10-30 ENCOUNTER — Inpatient Hospital Stay: Payer: No Typology Code available for payment source | Attending: Family

## 2019-10-30 ENCOUNTER — Other Ambulatory Visit: Payer: Self-pay

## 2019-10-30 VITALS — BP 137/99 | HR 75 | Temp 98.0°F | Resp 18 | Ht 66.0 in | Wt 272.8 lb

## 2019-10-30 DIAGNOSIS — D508 Other iron deficiency anemias: Secondary | ICD-10-CM

## 2019-10-30 DIAGNOSIS — D509 Iron deficiency anemia, unspecified: Secondary | ICD-10-CM | POA: Diagnosis not present

## 2019-10-30 LAB — IRON AND TIBC
Iron: 137 ug/dL (ref 41–142)
Saturation Ratios: 44 % (ref 21–57)
TIBC: 314 ug/dL (ref 236–444)
UIBC: 177 ug/dL (ref 120–384)

## 2019-10-30 LAB — RETICULOCYTES
Immature Retic Fract: 10.2 % (ref 2.3–15.9)
RBC.: 3.88 MIL/uL (ref 3.87–5.11)
Retic Count, Absolute: 62.9 10*3/uL (ref 19.0–186.0)
Retic Ct Pct: 1.6 % (ref 0.4–3.1)

## 2019-10-30 LAB — CBC WITH DIFFERENTIAL (CANCER CENTER ONLY)
Abs Immature Granulocytes: 0.03 10*3/uL (ref 0.00–0.07)
Basophils Absolute: 0 10*3/uL (ref 0.0–0.1)
Basophils Relative: 1 %
Eosinophils Absolute: 0.2 10*3/uL (ref 0.0–0.5)
Eosinophils Relative: 4 %
HCT: 38.7 % (ref 36.0–46.0)
Hemoglobin: 12.9 g/dL (ref 12.0–15.0)
Immature Granulocytes: 1 %
Lymphocytes Relative: 41 %
Lymphs Abs: 1.5 10*3/uL (ref 0.7–4.0)
MCH: 32.4 pg (ref 26.0–34.0)
MCHC: 33.3 g/dL (ref 30.0–36.0)
MCV: 97.2 fL (ref 80.0–100.0)
Monocytes Absolute: 0.4 10*3/uL (ref 0.1–1.0)
Monocytes Relative: 10 %
Neutro Abs: 1.5 10*3/uL — ABNORMAL LOW (ref 1.7–7.7)
Neutrophils Relative %: 43 %
Platelet Count: 209 10*3/uL (ref 150–400)
RBC: 3.98 MIL/uL (ref 3.87–5.11)
RDW: 13 % (ref 11.5–15.5)
WBC Count: 3.5 10*3/uL — ABNORMAL LOW (ref 4.0–10.5)
nRBC: 0 % (ref 0.0–0.2)

## 2019-10-30 LAB — FERRITIN: Ferritin: 60 ng/mL (ref 11–307)

## 2019-10-30 NOTE — Progress Notes (Signed)
Hematology and Oncology Follow Up Visit  Victoria [name] [id number] 1960/05/10 59 y.o. 10/30/2019   Principle Diagnosis:  Iron deficiency anemia secondary to malabsorption, s/p gastric bypass in 2016  Current Therapy: IV iron as indicated   Interim History:  Victoria [name] is here today for follow-up. She is doing fairly well but has a lot of issues with both knees. She needs to have a revision of her left knee replacement and also a meniscus tear repair and arthroscopy of the right knee. This is planned tentatively for October 15th. She has associated numbness and tingling in her feet off and on.  She has tendonitis and bursitis in there right shoulder which causes some discomfort. She is avoiding shoulder surgery and plans to have an injection in November.  She has migraines that comes and go.  No fever, chills, n/v, cough, rash, dizziness, SOB, chest pain, palpitations, abdominal pain or changes in bowel or bladder habits. No episodes of bleeding. No bruising or petechiae.  No falls or syncope.  She states that her appetite comes and goes. She is doing her best to stay well hydrated. Her weight is stable at 172 lbs.  She is staying quite busy caring for her husband who was recently diagnosed with prostate cancer.    ECOG Performance Status: 1 - Symptomatic but completely ambulatory  Medications:  Allergies as of 10/30/2019      Reactions   Other Other (See Comments)   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   Estradiol Rash, Other (See Comments)   Local rash with estradiol patches      Medication List       Accurate as of October 30, 2019  8:36 AM. If you have any questions, ask your nurse or doctor.        STOP taking these medications   acetaminophen-codeine 300-30 MG tablet Commonly known as: TYLENOL #3 Stopped by: Laverna Peace, NP     TAKE these medications   acetaminophen 500 MG tablet Commonly known as: TYLENOL Take 1,000  mg by mouth every 6 (six) hours as needed (for pain).   Advil 200 MG tablet Generic drug: ibuprofen Take 200 mg by mouth as needed.   ALPRAZolam 1 MG tablet Commonly known as: XANAX TAKE 1 TABLET BY MOUTH EVERY NIGHT AT BEDTIME   CALCIUM PO Take by mouth. Few times a week   cyanocobalamin 1000 MCG/ML injection Commonly known as: (VITAMIN B-12) INJECT 1 ML INTRAMUSCULARLY EVERY MONTH   cyclobenzaprine 10 MG tablet Commonly known as: FLEXERIL TAKE 1 TABLET BY MOUTH AT BEDTIME   Divigel 1 MG/GM Gel Generic drug: Estradiol APPLY TO THE SKIN AT BEDTIME   gabapentin 600 MG tablet Commonly known as: NEURONTIN TAKE 1 TABLET BY MOUTH 3 TIMES DAILY.   Humira Pen 40 MG/0.8ML Pnkt Generic drug: Adalimumab Inject one 40mg /0.8 ml pen subq qweek   HYDROcodone-acetaminophen 5-325 MG tablet Commonly known as: Norco Take 1-2 tablets by mouth every 6 (six) hours as needed for moderate pain.   levothyroxine 112 MCG tablet Commonly known as: SYNTHROID TAKE 1 TABLET BY MOUTH ONCE A DAY   MULTIVITAMIN PO Take by mouth.   Nurtec 75 MG Tbdp Generic drug: Rimegepant Sulfate Take 75 mg by mouth daily as needed. For migraines. Take as close to onset of migraine as possible. One daily maximum.   pantoprazole 40 MG tablet Commonly known as: PROTONIX TAKE 1 TABLET BY MOUTH TWICE DAILY--- PRN   polyethylene glycol 17 g packet  Commonly known as: MIRALAX / GLYCOLAX Take 17 g by mouth daily as needed for mild constipation.   promethazine 25 MG tablet Commonly known as: PHENERGAN TAKE 1 TABLET BY MOUTH EVERY 8 HOURS AS NEEDED FOR NAUSEA AND VOMITING   valACYclovir 500 MG tablet Commonly known as: VALTREX TAKE 1 TABLET BY MOUTH ONCE DAILY       Allergies:  Allergies  Allergen Reactions  . 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 Estradiol Rash and Other (See Comments)    Local rash with estradiol patches    Past Medical History,  Surgical history, Social history, and Family History were reviewed and updated.  Review of Systems: All other 10 point review of systems is negative.   Physical Exam:  height is 5\' 6"  (1.676 m) and weight is 272 lb 12.8 oz (123.7 kg). Her oral temperature is 98 F (36.7 C). Her blood pressure is 137/99 (abnormal) and her pulse is 75. Her respiration is 18 and oxygen saturation is 100%.   Wt Readings from Last 3 Encounters:  10/30/19 272 lb 12.8 oz (123.7 kg)  07/16/19 264 lb (119.7 kg)  07/02/19 266 lb (120.7 kg)    Ocular: Sclerae unicteric, pupils equal, round and reactive to light Ear-nose-throat: Oropharynx clear, dentition fair Lymphatic: No cervical or supraclavicular adenopathy Lungs no rales or rhonchi, good excursion bilaterally Heart regular rate and rhythm, no murmur appreciated Abd soft, nontender, positive bowel sounds MSK no focal spinal tenderness, no joint edema Neuro: non-focal, well-oriented, appropriate affect Breasts: Deferred   Lab Results  Component Value Date   WBC 3.5 (L) 10/30/2019   HGB 12.9 10/30/2019   HCT 38.7 10/30/2019   MCV 97.2 10/30/2019   PLT 209 10/30/2019   Lab Results  Component Value Date   FERRITIN 43 06/19/2019   IRON 139 06/19/2019   TIBC 273 06/19/2019   UIBC 133 06/19/2019   IRONPCTSAT 51 06/19/2019   Lab Results  Component Value Date   RETICCTPCT 1.6 10/30/2019   RBC 3.88 10/30/2019   RETICCTABS 36,500 10/02/2018   No results found for: KPAFRELGTCHN, LAMBDASER, KAPLAMBRATIO No results found for: IGGSERUM, IGA, IGMSERUM No results found for: Odetta Pink, SPEI   Chemistry      Component Value Date/Time   NA 142 04/09/2019 0927   K 4.7 04/09/2019 0927   CL 106 04/09/2019 0927   CO2 31 04/09/2019 0927   BUN 15 04/09/2019 0927   CREATININE 0.79 04/09/2019 0927      Component Value Date/Time   CALCIUM 9.2 04/09/2019 0927   ALKPHOS 59 10/16/2018 0903   AST 20  04/09/2019 0927   AST 26 10/16/2018 0903   ALT 21 04/09/2019 0927   ALT 26 10/16/2018 0903   BILITOT 0.5 04/09/2019 0927   BILITOT 0.5 10/16/2018 2706       Impression and Plan: Victoria [name] is a very pleasant 59 yo African American female with iron deficiency anemia secondary to malabsorptionsincelaparoscopic gastric bypass in 2016. Iron studies are pending. We will replace if needed.  We will plan to see her again in 6 months.  She can contact our office with any questions or concerns.   Laverna Peace, NP 9/3/20218:36 AM

## 2019-11-03 MED FILL — HUMIRA PEN 40 MG/0.8ML PNKT: 40 | 28 days supply | Qty: 4 | Fill #4

## 2019-11-05 ENCOUNTER — Encounter: Payer: Self-pay | Admitting: Obstetrics & Gynecology

## 2019-11-05 ENCOUNTER — Ambulatory Visit (INDEPENDENT_AMBULATORY_CARE_PROVIDER_SITE_OTHER): Payer: No Typology Code available for payment source | Admitting: Obstetrics & Gynecology

## 2019-11-05 ENCOUNTER — Other Ambulatory Visit: Payer: Self-pay

## 2019-11-05 ENCOUNTER — Other Ambulatory Visit: Payer: Self-pay | Admitting: Obstetrics & Gynecology

## 2019-11-05 VITALS — BP 110/78 | HR 70 | Resp 16 | Wt 278.0 lb

## 2019-11-05 DIAGNOSIS — R319 Hematuria, unspecified: Secondary | ICD-10-CM | POA: Diagnosis not present

## 2019-11-05 DIAGNOSIS — N898 Other specified noninflammatory disorders of vagina: Secondary | ICD-10-CM | POA: Diagnosis not present

## 2019-11-05 DIAGNOSIS — N390 Urinary tract infection, site not specified: Secondary | ICD-10-CM

## 2019-11-05 LAB — POCT URINALYSIS DIPSTICK
Bilirubin, UA: NEGATIVE
Glucose, UA: NEGATIVE
Ketones, UA: NEGATIVE
Nitrite, UA: NEGATIVE
Protein, UA: NEGATIVE
Urobilinogen, UA: NEGATIVE E.U./dL — AB
pH, UA: 5 (ref 5.0–8.0)

## 2019-11-05 MED ORDER — SULFAMETHOXAZOLE-TRIMETHOPRIM 800-160 MG PO TABS: 1.0000 | ORAL_TABLET | Freq: Two times a day (BID) | ORAL | 0 refills | Status: DC

## 2019-11-05 MED ORDER — TRAZODONE HCL 50 MG PO TABS
50.0000 mg | ORAL_TABLET | Freq: Every day | ORAL | 1 refills | Status: DC
Start: 2019-11-05 — End: 2019-11-05

## 2019-11-05 MED ORDER — ESTRING 2 MG VA RING: 2.0000 mg | VAGINAL_RING | VAGINAL | 3 refills | Status: DC

## 2019-11-05 MED FILL — traZODone HCL 50 MG TABS: 50 | 30 days supply | Qty: 30 | Fill #0

## 2019-11-05 MED FILL — SULFAMETHOXAZOLE-TMP DS TAB: 800-160 | 3 days supply | Qty: 6 | Fill #0

## 2019-11-05 MED FILL — ESTRING 2 MG VAGINAL RING: 2 | 90 days supply | Qty: 1 | Fill #0

## 2019-11-05 NOTE — Progress Notes (Signed)
GYNECOLOGY  VISIT  CC:   Urinary urgency, urinary hesitancy  HPI: 59 y.o. G2P0112 Married [place] American female here for increased urinary urgency/frequency.  This started a few weekends ago but then got better.  Yesterday, she really started having dysuria yesterday.  She is also having urinary hesitancy.  Denies seeing any blood in her urine.   Husband was diagnosed with prostate cancer.  He had a prostatectomy.  This is just another stressor for her.  Denies vaginal bleeding.  Denies fever or pelvic pain.  She is still working.  She is very frustrated with her weight.  Having trouble exercising.    Reports Trazodone has helped her insomnia.  Would like a refill for this.  Pt continues to have vaginal discharge.  Had negative pap smear, h/o hysterectomy, negative complete vaginitis testing.  Has used vaginal estrogen cream and vagifem.  In retrospect, thinks maybe the vagiem did decrease her symptoms.  She is on divigel as well.  Asks for any other options.  Estring discussed.  Showed how to get savings coupon.  She's willing to try.  poct urine-wbc 1+, rbc 1+  GYNECOLOGIC HISTORY: No LMP recorded. Patient has had a hysterectomy. Contraception: hysterectomy Menopausal hormone therapy: none  Patient Active Problem List   Diagnosis Date Noted  . 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 Acute medial meniscal tear, right, subsequent encounter 08/27/2019  . History of total left knee replacement 07/16/2019  . Peroneal neuropathy at knee, left 06/25/2019  . Lumbosacral radiculopathy 06/07/2019  . 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 IDA (iron deficiency anemia) 10/16/2018  . OA (osteoarthritis) of knee 01/20/2018  . Acute meniscal tear, medial 07/17/2017  . 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 Insomnia 11/08/2016  . Pernicious anemia 11/08/2016  . Leukopenia 07/09/2016  . Hypocupremia  07/09/2016  . Palpitations 04/12/2016  . 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 Lap gastric bypass May 2016 07/06/2014  . History of Clostridium difficile infection 08/12/2011  . Postoperative hypothyroidism 08/26/2010  . Gastroesophageal reflux disease 08/26/2010  . Vitamin D deficiency 08/26/2010  . Fibrocystic disease of breast 08/26/2010  . Cobalamin deficiency 08/26/2010  . Obesity 03/20/2010  . Backache 03/20/2010    Past Medical History:  Diagnosis Date  . Chronic leukopenia    intermittant since 2010, followed by Dr. Renold Genta now  . Degenerative joint disease of low back 09/02/15    Dr. Maureen Ralphs  . Fibrocystic breast   . GERD (gastroesophageal reflux disease)   . Headache    per pt more stress/tension  . Heart palpitations    SEE EPIC ENCOUTNER , CARDIOLOGY DR. Tressia Miners TURNER 2018; reports on 05-16-17 " i haven't had any bouts of those lately"    . Hemorrhoids   . History of Clostridium difficile infection 12/2010  . History of exercise intolerance    ETT on 04-27-2016-- negative ([facility] treadmill score 7)  . History of Helicobacter pylori infection 2001 and 09/ 2012  . HSV-2 infection    genital  . Hx of adenomatous colonic polyps 10/17/2007  . Hydradenitis    per pt currently treated with Humira injection  . Hypothyroidism, postsurgical 1980  . Osteoarthritis   . Pernicious anemia    b12 def  . PONV (postoperative nausea and vomiting)   . 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  Varicose veins   . Vitamin B 12 deficiency   . Vitamin D deficiency     Past Surgical History:  Procedure Laterality Date  . BREATH TEK H PYLORI N/A 05/03/2014   Procedure: BREATH TEK H PYLORI;  Surgeon: Kaylyn Lim, MD;  Location: [facility];  Service: General;  Laterality: N/A;  . CARDIOVASCULAR STRESS TEST  03/11/2013   dr Tressia Miners turner   Low risk nuclear study w/ a  very small anterior perfusion defect that most likely represents breast attenuation artifact, less likely this could represent a true perfusion abnormality in the distribution of diagonal branch of the LAD/  normal LV function and wall motion , ef 66%  . CATARACT EXTRACTION W/ INTRAOCULAR LENS  IMPLANT, BILATERAL  10/2017  . CESAREAN SECTION  1985  . CHOLECYSTECTOMY OPEN  1987  . COLONOSCOPY  02/2017   Dr [name] ; per patient they found 7 polyps with polypectomy; on 5 year track   . FINE NEEDLE ASPIRATION Left 05/22/2017   Procedure: LEFT KNEE ASPIRATION;  Surgeon: Gaynelle Arabian, MD;  Location: [facility];  Service: Orthopedics;  Laterality: Left;  Marland Kitchen GASTRIC ROUX-EN-Y N/A 07/06/2014   Procedure: LAPAROSCOPIC ROUX-EN-Y GASTRIC BYPASS, ENTEROLYSIS OF ADHESIONS WITH UPPER ENDOSCOPY;  Surgeon: Johnathan Hausen, MD;  Location: [facility];  Service: General;  Laterality: N/A;  . HAMMER TOE SURGERY Bilateral 02/17/2008   bilateral foot digit's 2 and 5  . HIATAL HERNIA REPAIR N/A 07/06/2014   Procedure: LAPAROSCOPIC REPAIR OF HIATAL HERNIA;  Surgeon: Johnathan Hausen, MD;  Location: [facility];  Service: General;  Laterality: N/A;  . KNEE ARTHROSCOPY WITH MEDIAL MENISECTOMY Left 07/17/2017   Procedure: LEFT KNEE ARTHROSCOPY WITH MEDIAL LATERAL MENISECTOMY;  Surgeon: Gaynelle Arabian, MD;  Location: [facility];  Service: Orthopedics;  Laterality: Left;  Marland Kitchen MASS EXCISION N/A 10/11/2016   Procedure: EXCISION OF PERIANAL MASS;  Surgeon: [name], MD;  Location: [facility];  Service: General;  Laterality: N/A;  . REFRACTIVE SURGERY    . THYROIDECTOMY  1980  . TOTAL ABDOMINAL HYSTERECTOMY  08-25-2002   dr Margaretha Glassing   ovaries retained  . TOTAL HIP ARTHROPLASTY Right 05/21/2012   Procedure: TOTAL HIP ARTHROPLASTY;  Surgeon: Gearlean Alf, MD;  Location: [facility];  Service: Orthopedics;  Laterality: Right;  . TOTAL HIP ARTHROPLASTY Left 01-31-2009  dr Wynelle Link  . TOTAL HIP REVISION Left 05/22/2017   Procedure: Left  hip bearing surface and head revision;  Surgeon: Gaynelle Arabian, MD;  Location: [facility];  Service: Orthopedics;  Laterality: Left;  . TOTAL KNEE ARTHROPLASTY Left 01/20/2018   Procedure: LEFT TOTAL KNEE ARTHROPLASTY;  Surgeon: Gaynelle Arabian, MD;  Location: [facility];  Service: Orthopedics;  Laterality: Left;  95min  . TRANSTHORACIC ECHOCARDIOGRAM  05/03/2016   ef 55-60%/  trivial MR/  mild TR  . TUBAL LIGATION      MEDS:   Current Outpatient Medications on File Prior to Visit  Medication Sig Dispense Refill  . acetaminophen (TYLENOL) 500 MG tablet Take 1,000 mg by mouth every 6 (six) hours as needed (for pain).    . Adalimumab (HUMIRA PEN) 40 MG/0.8ML PNKT Inject one 40mg /0.8 ml pen subq qweek 4 each 5  . ALPRAZolam (XANAX) 1 MG tablet TAKE 1 TABLET BY MOUTH EVERY NIGHT AT BEDTIME 90 tablet 0  . CALCIUM PO Take by mouth. Few times a week    . cyanocobalamin (,VITAMIN B-12,) 1000 MCG/ML injection INJECT 1 ML INTRAMUSCULARLY EVERY MONTH 3 mL 11  . cyclobenzaprine (FLEXERIL) 10  MG tablet TAKE 1 TABLET BY MOUTH AT BEDTIME 90 tablet 3  . DIVIGEL 1 MG/GM GEL APPLY TO THE SKIN AT BEDTIME 90 g 4  . gabapentin (NEURONTIN) 600 MG tablet TAKE 1 TABLET BY MOUTH 3 TIMES DAILY. 90 tablet 2  . HYDROcodone-acetaminophen (NORCO) 5-325 MG tablet Take 1-2 tablets by mouth every 6 (six) hours as needed for moderate pain. 25 tablet 0  . ibuprofen (ADVIL) 200 MG tablet Take 200 mg by mouth as needed.    Marland Kitchen levothyroxine (SYNTHROID) 112 MCG tablet TAKE 1 TABLET BY MOUTH ONCE A DAY 90 tablet 2  . Multiple Vitamin (MULTIVITAMIN PO) Take by mouth.    . pantoprazole (PROTONIX) 40 MG tablet TAKE 1 TABLET BY MOUTH TWICE DAILY--- PRN    . polyethylene glycol (MIRALAX / GLYCOLAX) packet Take 17 g by mouth daily as needed for mild constipation.    . promethazine (PHENERGAN) 25 MG tablet TAKE 1 TABLET BY MOUTH EVERY 8 HOURS AS NEEDED FOR NAUSEA AND VOMITING 30 tablet 5  . Rimegepant Sulfate (NURTEC) 75 MG TBDP Take 75 mg by  mouth daily as needed. For migraines. Take as close to onset of migraine as possible. One daily maximum. 10 tablet 11  . valACYclovir (VALTREX) 500 MG tablet TAKE 1 TABLET BY MOUTH ONCE DAILY 90 tablet 3   Current Facility-Administered Medications on File Prior to Visit  Medication Dose Route Frequency Provider Last Rate Last Admin  . gadobenate dimeglumine (MULTIHANCE) injection 20 mL  20 mL Intravenous Once PRN Melvenia Beam, MD        ALLERGIES: Other and Estradiol  Family History  Problem Relation Age of Onset  . Diabetes Father   . Diabetes Sister   . Hypertension Sister   . Hypertension Mother   . Headache Mother     SH:  Married, non smoker  Review of Systems  Constitutional: Negative.   HENT: Negative.   Eyes: Negative.   Respiratory: Negative.   Cardiovascular: Negative.   Gastrointestinal: Negative.   Endocrine: Negative.   Genitourinary: Positive for dyspareunia.  Musculoskeletal: Negative.   Skin: Negative.   Allergic/Immunologic: Negative.   Neurological: Negative.   Hematological: Negative.   Psychiatric/Behavioral: Negative.     PHYSICAL EXAMINATION:    BP 110/78   Pulse 70   Resp 16   Wt 278 lb (126.1 kg)   BMI 44.87 kg/m     General appearance: alert, cooperative and appears stated age Flank:  No CVA tenderness Abdomen: soft, non-tender; bowel sounds normal; no masses,  no organomegaly Lymph:  no inguinal LAD noted  Pelvic: External genitalia:  no lesions              Urethra:  normal appearing urethra with no masses, tenderness or lesions              Bartholins and Skenes: normal                 Vagina: normal appearing vagina with normal color and discharge, no lesions              Cervix: absent              Bimanual Exam:  Uterus:  uterus absent              Adnexa: no mass, fullness, tenderness  Chaperone, Royal Hawthorn, CMA, was present for exam.  Assessment: Urinary urgency/hesitancy Microscopic hematuria Vaginal  discharge Insomnia  Plan: Urine micro and culture pending RF for Trazodone  to pharmacy.  50mg  nightly prn.  Pt uses infrequently.  #30/1RF. Estring 2mg  pv q 3 months.  Rx to pharmacy.  #1/3RF   29 minutes in total with pt

## 2019-11-06 LAB — URINALYSIS, MICROSCOPIC ONLY
Casts: NONE SEEN /lpf
WBC, UA: NONE SEEN /hpf (ref 0–5)

## 2019-11-07 LAB — URINE CULTURE

## 2019-11-10 NOTE — Progress Notes (Signed)
GYNECOLOGY  VISIT  CC:   Microscopic hematuria  HPI: 59 y.o. G2P0112 Married [place] or [place] American female here for cath urine.  She had microscopic blood noted on clean cath u/a and culture that was done due to urinary urgency and frequency.  Culture was negative.  Symptoms have resoled.  Pt was prescribed estring 2mg  for vaginal discharge that has been negative for vaginitis and she's had a negative pap and there are no visual findings on exam.  She hasn't started this.  Has some additional questions today.  She is going to proceed.  Denies vaginal bleeding.  GYNECOLOGIC HISTORY: No LMP recorded. Patient has had a hysterectomy. Contraception: hysterectomy Menopausal hormone therapy: none  Patient Active Problem List   Diagnosis Date Noted  . 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 Acute medial meniscal tear, right, subsequent encounter 08/27/2019  . History of total left knee replacement 07/16/2019  . Peroneal neuropathy at knee, left 06/25/2019  . Lumbosacral radiculopathy 06/07/2019  . 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 IDA (iron deficiency anemia) 10/16/2018  . OA (osteoarthritis) of knee 01/20/2018  . Acute meniscal tear, medial 07/17/2017  . 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 Insomnia 11/08/2016  . Pernicious anemia 11/08/2016  . Leukopenia 07/09/2016  . Hypocupremia 07/09/2016  . Palpitations 04/12/2016  . 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 Lap gastric bypass May 2016 07/06/2014  . History of Clostridium difficile infection 08/12/2011  . Postoperative hypothyroidism 08/26/2010  . Gastroesophageal reflux disease 08/26/2010  . Vitamin D deficiency 08/26/2010  . Fibrocystic disease of breast 08/26/2010  . Cobalamin deficiency 08/26/2010  . Obesity 03/20/2010  . Backache 03/20/2010    Past Medical History:  Diagnosis Date  .  Bursitis    right shoulder  . Chronic leukopenia    intermittant since 2010, followed by Dr. Renold Genta now  . Degenerative joint disease of low back 09/02/15    Dr. Maureen Ralphs  . Fibrocystic breast   . GERD (gastroesophageal reflux disease)   . Headache    per pt more stress/tension  . Heart palpitations    SEE EPIC ENCOUTNER , CARDIOLOGY DR. Tressia Miners TURNER 2018; reports on 05-16-17 " i haven't had any bouts of those lately"    . Hemorrhoids   . History of Clostridium difficile infection 12/2010  . History of exercise intolerance    ETT on 04-27-2016-- negative ([facility] treadmill score 7)  . History of Helicobacter pylori infection 2001 and 09/ 2012  . HSV-2 infection    genital  . Hx of adenomatous colonic polyps 10/17/2007  . Hydradenitis    per pt currently treated with Humira injection  . Hypothyroidism, postsurgical 1980  . Medial meniscus tear    right knee  . Osteoarthritis   . Pernicious anemia    b12 def  . PONV (postoperative nausea and vomiting)   . 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 Tendonitis    right shoulder  . Varicose veins   . Vitamin B 12 deficiency   . Vitamin D deficiency     Past Surgical History:  Procedure Laterality Date  . BREATH TEK H PYLORI N/A 05/03/2014   Procedure: BREATH TEK H PYLORI;  Surgeon: Kaylyn Lim, MD;  Location: [facility];  Service: General;  Laterality: N/A;  . CARDIOVASCULAR STRESS TEST  03/11/2013  dr Tressia Miners turner   Low risk nuclear study w/ a very small anterior perfusion defect that most likely represents breast attenuation artifact, less likely this could represent a true perfusion abnormality in the distribution of diagonal branch of the LAD/  normal LV function and wall motion , ef 66%  . CATARACT EXTRACTION W/ INTRAOCULAR LENS  IMPLANT, BILATERAL  10/2017  . CESAREAN SECTION  1985  . CHOLECYSTECTOMY OPEN  1987  . COLONOSCOPY  02/2017   Dr [name]  ; per patient they found 7 polyps with polypectomy; on 5 year track   . FINE NEEDLE ASPIRATION Left 05/22/2017   Procedure: LEFT KNEE ASPIRATION;  Surgeon: Gaynelle Arabian, MD;  Location: [facility];  Service: Orthopedics;  Laterality: Left;  Marland Kitchen GASTRIC ROUX-EN-Y N/A 07/06/2014   Procedure: LAPAROSCOPIC ROUX-EN-Y GASTRIC BYPASS, ENTEROLYSIS OF ADHESIONS WITH UPPER ENDOSCOPY;  Surgeon: Johnathan Hausen, MD;  Location: [facility];  Service: General;  Laterality: N/A;  . HAMMER TOE SURGERY Bilateral 02/17/2008   bilateral foot digit's 2 and 5  . HIATAL HERNIA REPAIR N/A 07/06/2014   Procedure: LAPAROSCOPIC REPAIR OF HIATAL HERNIA;  Surgeon: Johnathan Hausen, MD;  Location: [facility];  Service: General;  Laterality: N/A;  . KNEE ARTHROSCOPY WITH MEDIAL MENISECTOMY Left 07/17/2017   Procedure: LEFT KNEE ARTHROSCOPY WITH MEDIAL LATERAL MENISECTOMY;  Surgeon: Gaynelle Arabian, MD;  [facility]: [facility];  Service: Orthopedics;  Laterality: Left;  Marland Kitchen MASS EXCISION N/A 10/11/2016   Procedure: EXCISION OF PERIANAL MASS;  Surgeon: [name], MD;  Location: [facility];  Service: General;  Laterality: N/A;  . REFRACTIVE SURGERY    . THYROIDECTOMY  1980  . TOTAL ABDOMINAL HYSTERECTOMY  08-25-2002   dr Margaretha Glassing   ovaries retained  . TOTAL HIP ARTHROPLASTY Right 05/21/2012   Procedure: TOTAL HIP ARTHROPLASTY;  Surgeon: Gearlean Alf, MD;  [facility];  Service: Orthopedics;  Laterality: Right;  . TOTAL HIP ARTHROPLASTY Left 01-31-2009  dr Wynelle Link  . TOTAL HIP REVISION Left 05/22/2017   Procedure: Left hip bearing surface and head revision;  Surgeon: Gaynelle Arabian, MD;  [facility];  Service: Orthopedics;  Laterality: Left;  . TOTAL KNEE ARTHROPLASTY Left 01/20/2018   Procedure: LEFT TOTAL KNEE ARTHROPLASTY;  Surgeon: Gaynelle Arabian, MD;  [facility]: [facility];  Service: Orthopedics;  Laterality: Left;  83min  . TRANSTHORACIC ECHOCARDIOGRAM  05/03/2016   ef 55-60%/  trivial MR/  mild TR  . TUBAL LIGATION       MEDS:   Current Outpatient Medications on File Prior to Visit  Medication Sig Dispense Refill  . acetaminophen (TYLENOL) 500 MG tablet Take 1,000 mg by mouth every 6 (six) hours as needed (for pain).    . Adalimumab (HUMIRA PEN) 40 MG/0.8ML PNKT Inject one 40mg /0.8 ml pen subq qweek 4 each 5  . ALPRAZolam (XANAX) 1 MG tablet TAKE 1 TABLET BY MOUTH EVERY NIGHT AT BEDTIME 90 tablet 0  . CALCIUM PO Take by mouth. Few times a week    . cyanocobalamin (,VITAMIN B-12,) 1000 MCG/ML injection INJECT 1 ML INTRAMUSCULARLY EVERY MONTH 3 mL 11  . cyclobenzaprine (FLEXERIL) 10 MG tablet TAKE 1 TABLET BY MOUTH AT BEDTIME 90 tablet 3  . DIVIGEL 1 MG/GM GEL APPLY TO THE SKIN AT BEDTIME 90 g 4  . gabapentin (NEURONTIN) 600 MG tablet TAKE 1 TABLET BY MOUTH 3 TIMES DAILY. 90 tablet 2  . HYDROcodone-acetaminophen (NORCO) 5-325 MG tablet Take 1-2 tablets by mouth every 6 (six) hours as needed for moderate  pain. 25 tablet 0  . ibuprofen (ADVIL) 200 MG tablet Take 200 mg by mouth as needed.    Marland Kitchen levothyroxine (SYNTHROID) 112 MCG tablet TAKE 1 TABLET BY MOUTH ONCE A DAY 90 tablet 2  . Multiple Vitamin (MULTIVITAMIN PO) Take by mouth.    . pantoprazole (PROTONIX) 40 MG tablet TAKE 1 TABLET BY MOUTH TWICE DAILY--- PRN    . polyethylene glycol (MIRALAX / GLYCOLAX) packet Take 17 g by mouth daily as needed for mild constipation.    . promethazine (PHENERGAN) 25 MG tablet TAKE 1 TABLET BY MOUTH EVERY 8 HOURS AS NEEDED FOR NAUSEA AND VOMITING 30 tablet 5  . Rimegepant Sulfate (NURTEC) 75 MG TBDP Take 75 mg by mouth daily as needed. For migraines. Take as close to onset of migraine as possible. One daily maximum. 10 tablet 11  . traZODone (DESYREL) 50 MG tablet Take 1 tablet (50 mg total) by mouth at bedtime. 30 tablet 1  . valACYclovir (VALTREX) 500 MG tablet TAKE 1 TABLET BY MOUTH ONCE DAILY 90 tablet 3  . estradiol (ESTRING) 2 MG vaginal ring Place 2 mg vaginally every 3 (three) months. follow package directions  (Patient not taking: Reported on 11/12/2019) 1 each 3   Current Facility-Administered Medications on File Prior to Visit  Medication Dose Route Frequency Provider Last Rate Last Admin  . gadobenate dimeglumine (MULTIHANCE) injection 20 mL  20 mL Intravenous Once PRN Melvenia Beam, MD        ALLERGIES: Other and Estradiol  Family History  Problem Relation Age of Onset  . Diabetes Father   . Diabetes Sister   . Hypertension Sister   . Hypertension Mother   . Headache Mother     SH:  Married, non smoker  Review of Systems  Constitutional: Negative.   HENT: Negative.   Eyes: Negative.   Respiratory: Negative.   Cardiovascular: Negative.   Gastrointestinal: Negative.   Endocrine: Negative.   Genitourinary: Negative.   Musculoskeletal: Negative.   Skin: Negative.   Allergic/Immunologic: Negative.   Neurological: Negative.   Hematological: Negative.   Psychiatric/Behavioral: Negative.     PHYSICAL EXAMINATION:    BP 114/68   Pulse 68   Resp 16   Wt 273 lb (123.8 kg)   BMI 44.06 kg/m     General appearance: alert, cooperative and appears stated age Lymph:  no inguinal LAD noted  Pelvic: External genitalia:  no lesions              Urethra:  normal appearing urethra with no masses, tenderness or lesions              Bartholins and Skenes: normal                 Cath u/a performed under sterile technique with straight cath.  This was removed.  Pt tolerated procedure well.  Chaperone, Olene Floss, CMA, was present for exam.  Assessment: Microscopic hematuria Vaginal discharge that I feel is due to atrophic changes Vasomotor symptoms in pt on HRT  Plan: Cath u/a obtained today.  If still positive for blood, will refer to urology.

## 2019-11-12 ENCOUNTER — Other Ambulatory Visit: Payer: Self-pay

## 2019-11-12 ENCOUNTER — Ambulatory Visit (INDEPENDENT_AMBULATORY_CARE_PROVIDER_SITE_OTHER): Payer: No Typology Code available for payment source | Admitting: Obstetrics & Gynecology

## 2019-11-12 ENCOUNTER — Encounter: Payer: Self-pay | Admitting: Obstetrics & Gynecology

## 2019-11-12 VITALS — BP 114/68 | HR 68 | Resp 16 | Wt 273.0 lb

## 2019-11-12 DIAGNOSIS — R3129 Other microscopic hematuria: Secondary | ICD-10-CM | POA: Diagnosis not present

## 2019-11-13 LAB — URINALYSIS, MICROSCOPIC ONLY
Bacteria, UA: NONE SEEN
Casts: NONE SEEN /lpf
RBC, Urine: NONE SEEN /hpf (ref 0–2)
WBC, UA: NONE SEEN /hpf (ref 0–5)

## 2019-11-23 ENCOUNTER — Telehealth: Payer: Self-pay | Admitting: Orthopaedic Surgery

## 2019-11-23 NOTE — Telephone Encounter (Signed)
[employer] forms received. Sent to [employer]. 

## 2019-11-25 ENCOUNTER — Other Ambulatory Visit: Payer: Self-pay

## 2019-11-30 ENCOUNTER — Telehealth: Payer: Self-pay

## 2019-11-30 ENCOUNTER — Ambulatory Visit (INDEPENDENT_AMBULATORY_CARE_PROVIDER_SITE_OTHER): Payer: No Typology Code available for payment source | Admitting: Internal Medicine

## 2019-11-30 ENCOUNTER — Other Ambulatory Visit: Payer: Self-pay

## 2019-11-30 ENCOUNTER — Encounter: Payer: Self-pay | Admitting: Internal Medicine

## 2019-11-30 VITALS — BP 110/80 | HR 78 | Ht 66.0 in | Wt 271.0 lb

## 2019-11-30 DIAGNOSIS — M25562 Pain in left knee: Secondary | ICD-10-CM | POA: Diagnosis not present

## 2019-11-30 DIAGNOSIS — G8929 Other chronic pain: Secondary | ICD-10-CM | POA: Diagnosis not present

## 2019-11-30 DIAGNOSIS — M25561 Pain in right knee: Secondary | ICD-10-CM | POA: Diagnosis not present

## 2019-11-30 NOTE — Telephone Encounter (Signed)
Patient called is having an "unusual feeling" and feels like she needs to see you. She couldn't explain what she was feeling but she feels like she needs to see you and she is also is having bilateral knee pain.

## 2019-11-30 NOTE — Telephone Encounter (Signed)
OK today or tomorrow. Very limited appointments

## 2019-12-01 MED FILL — HUMIRA PEN 40 MG/0.8ML PNKT: 40 | 28 days supply | Qty: 4 | Fill #5

## 2019-12-02 ENCOUNTER — Encounter: Payer: Self-pay | Admitting: Orthopaedic Surgery

## 2019-12-03 MED FILL — GABAPENTIN 600 MG TABLET: 600 | 30 days supply | Qty: 90 | Fill #2

## 2019-12-03 MED FILL — ALPRAZOLAM 1 MG TABS: 1 | 30 days supply | Qty: 30 | Fill #1

## 2019-12-04 ENCOUNTER — Other Ambulatory Visit: Payer: Self-pay | Admitting: Physician Assistant

## 2019-12-09 ENCOUNTER — Other Ambulatory Visit (HOSPITAL_COMMUNITY)
Admission: RE | Admit: 2019-12-09 | Discharge: 2019-12-09 | Disposition: A | Discharge status: Home / Self Care | Payer: No Typology Code available for payment source | Source: Ambulatory Visit | Attending: Orthopaedic Surgery | Admitting: Orthopaedic Surgery

## 2019-12-09 DIAGNOSIS — Z20822 Contact with and (suspected) exposure to covid-19: Secondary | ICD-10-CM | POA: Diagnosis not present

## 2019-12-09 DIAGNOSIS — Z01812 Encounter for preprocedural laboratory examination: Secondary | ICD-10-CM | POA: Diagnosis not present

## 2019-12-09 LAB — SARS CORONAVIRUS 2 (TAT 6-24 HRS): SARS Coronavirus 2: NEGATIVE

## 2019-12-09 NOTE — Progress Notes (Addendum)
PCP:  Tedra Senegal, MD Cardiologist:  Dr. Fransico Him  EKG:  N/A CXR:  04/23/14 ECHO:  05/03/16 Stress Test:  Denies Cardiac Cath:  Denies  Covid test 12/09/19  Patient denies shortness of breath, fever, cough, and chest pain at PAT appointment.  Patient verbalized understanding of instructions provided today at the PAT appointment.  Patient asked to review instructions at home and day of surgery.

## 2019-12-10 MED ORDER — DEXTROSE 5 % IV SOLN
3.0000 g | INTRAVENOUS | Status: AC
  Administered 2019-12-11: 3 g via INTRAVENOUS
  Filled 2019-12-10: qty 3
  Filled 2019-12-10: qty 3000

## 2019-12-10 MED ORDER — DEXTROSE 5 % IV SOLN
3.0000 g | INTRAVENOUS | Status: DC
  Filled 2019-12-10: qty 3000

## 2019-12-11 ENCOUNTER — Ambulatory Visit (HOSPITAL_COMMUNITY): Payer: No Typology Code available for payment source | Admitting: Anesthesiology

## 2019-12-11 ENCOUNTER — Observation Stay (HOSPITAL_COMMUNITY)
Admission: RE | Admit: 2019-12-11 | Discharge: 2019-12-12 | Disposition: A | Discharge status: Home / Self Care | Payer: No Typology Code available for payment source | Attending: Orthopaedic Surgery | Admitting: Orthopaedic Surgery

## 2019-12-11 ENCOUNTER — Encounter (HOSPITAL_COMMUNITY): Payer: Self-pay | Admitting: Orthopaedic Surgery

## 2019-12-11 ENCOUNTER — Encounter (HOSPITAL_COMMUNITY)
Admission: RE | Disposition: A | Discharge status: Home / Self Care | Payer: Self-pay | Source: Home / Self Care | Attending: Orthopaedic Surgery

## 2019-12-11 ENCOUNTER — Other Ambulatory Visit: Payer: Self-pay

## 2019-12-11 DIAGNOSIS — S83241D Other tear of medial meniscus, current injury, right knee, subsequent encounter: Secondary | ICD-10-CM

## 2019-12-11 DIAGNOSIS — E039 Hypothyroidism, unspecified: Secondary | ICD-10-CM | POA: Diagnosis not present

## 2019-12-11 DIAGNOSIS — Z79899 Other long term (current) drug therapy: Secondary | ICD-10-CM | POA: Insufficient documentation

## 2019-12-11 DIAGNOSIS — Z96651 Presence of right artificial knee joint: Secondary | ICD-10-CM | POA: Diagnosis not present

## 2019-12-11 DIAGNOSIS — Z9889 Other specified postprocedural states: Secondary | ICD-10-CM

## 2019-12-11 DIAGNOSIS — S83241A Other tear of medial meniscus, current injury, right knee, initial encounter: Principal | ICD-10-CM | POA: Insufficient documentation

## 2019-12-11 DIAGNOSIS — Z96643 Presence of artificial hip joint, bilateral: Secondary | ICD-10-CM | POA: Diagnosis not present

## 2019-12-11 DIAGNOSIS — X58XXXA Exposure to other specified factors, initial encounter: Secondary | ICD-10-CM | POA: Diagnosis not present

## 2019-12-11 HISTORY — PX: KNEE ARTHROSCOPY: SHX127

## 2019-12-11 HISTORY — PX: I & D KNEE WITH POLY EXCHANGE: SHX5024

## 2019-12-11 LAB — CREATININE, SERUM
Creatinine, Ser: 0.86 mg/dL (ref 0.44–1.00)
GFR, Estimated: >60 mL/min (ref >60)

## 2019-12-11 LAB — CBC
HCT: 37.1 % (ref 36.0–46.0)
Hemoglobin: 12.3 g/dL (ref 12.0–15.0)
MCH: 33.2 pg (ref 26.0–34.0)
MCHC: 33.2 g/dL (ref 30.0–36.0)
MCV: 100.3 fL — ABNORMAL HIGH (ref 80.0–100.0)
Platelets: 218 10*3/uL (ref 150–400)
RBC: 3.7 MIL/uL — ABNORMAL LOW (ref 3.87–5.11)
RDW: 13.7 % (ref 11.5–15.5)
WBC: 3.6 10*3/uL — ABNORMAL LOW (ref 4.0–10.5)
nRBC: 0 % (ref 0.0–0.2)

## 2019-12-11 SURGERY — IRRIGATION AND DEBRIDEMENT KNEE WITH POLY EXCHANGE
Anesthesia: General | Site: Knee | Laterality: Right

## 2019-12-11 MED ORDER — OXYCODONE HCL 5 MG PO TABS
ORAL_TABLET | ORAL | Status: AC
  Filled 2019-12-11: qty 1

## 2019-12-11 MED ORDER — KETAMINE HCL 50 MG/5ML IJ SOSY
PREFILLED_SYRINGE | INTRAMUSCULAR | Status: AC
  Filled 2019-12-11: qty 5

## 2019-12-11 MED ORDER — LEVOTHYROXINE SODIUM 112 MCG PO TABS
112.0000 ug | ORAL_TABLET | Freq: Every day | ORAL | Status: DC
  Administered 2019-12-12: 112 ug via ORAL
  Filled 2019-12-11: qty 1

## 2019-12-11 MED ORDER — METHOCARBAMOL 500 MG PO TABS
500.0000 mg | ORAL_TABLET | Freq: Four times a day (QID) | ORAL | Status: DC | PRN
  Administered 2019-12-12 (×2): 500 mg via ORAL
  Filled 2019-12-11 (×2): qty 1

## 2019-12-11 MED ORDER — HYDROMORPHONE HCL 1 MG/ML IJ SOLN
INTRAMUSCULAR | Status: AC
  Filled 2019-12-11: qty 1

## 2019-12-11 MED ORDER — SODIUM CHLORIDE 0.9 % IR SOLN
Status: DC | PRN
  Administered 2019-12-11 (×3): 3000 mL

## 2019-12-11 MED ORDER — FENTANYL CITRATE (PF) 100 MCG/2ML IJ SOLN
INTRAMUSCULAR | Status: AC
  Filled 2019-12-11: qty 2

## 2019-12-11 MED ORDER — ONDANSETRON HCL 4 MG/2ML IJ SOLN: 4.0000 mg | Freq: Four times a day (QID) | INTRAMUSCULAR | Status: DC | PRN

## 2019-12-11 MED ORDER — LACTATED RINGERS IV SOLN: INTRAVENOUS | Status: DC

## 2019-12-11 MED ORDER — HYDROMORPHONE HCL 1 MG/ML IJ SOLN
0.5000 mg | INTRAMUSCULAR | Status: DC | PRN
  Administered 2019-12-12: 1 mg via INTRAVENOUS
  Filled 2019-12-11: qty 1

## 2019-12-11 MED ORDER — PROPOFOL 10 MG/ML IV BOLUS
INTRAVENOUS | Status: AC
  Filled 2019-12-11: qty 20

## 2019-12-11 MED ORDER — ASPIRIN 81 MG PO CHEW
81.0000 mg | CHEWABLE_TABLET | Freq: Two times a day (BID) | ORAL | Status: DC
  Administered 2019-12-11 – 2019-12-12 (×2): 81 mg via ORAL
  Filled 2019-12-11 (×2): qty 1

## 2019-12-11 MED ORDER — HYDROMORPHONE HCL 1 MG/ML IJ SOLN
INTRAMUSCULAR | Status: DC | PRN
  Administered 2019-12-11: .5 mg via INTRAVENOUS

## 2019-12-11 MED ORDER — DIPHENHYDRAMINE HCL 12.5 MG/5ML PO ELIX: 12.5000 mg | ORAL_SOLUTION | ORAL | Status: DC | PRN

## 2019-12-11 MED ORDER — PROPOFOL 10 MG/ML IV BOLUS
INTRAVENOUS | Status: DC | PRN
  Administered 2019-12-11: 180 mg via INTRAVENOUS

## 2019-12-11 MED ORDER — ONDANSETRON HCL 4 MG PO TABS: 4.0000 mg | ORAL_TABLET | Freq: Four times a day (QID) | ORAL | Status: DC | PRN

## 2019-12-11 MED ORDER — METHOCARBAMOL 1000 MG/10ML IJ SOLN
500.0000 mg | Freq: Four times a day (QID) | INTRAVENOUS | Status: DC | PRN
  Filled 2019-12-11: qty 5

## 2019-12-11 MED ORDER — METOCLOPRAMIDE HCL 5 MG/ML IJ SOLN: 5.0000 mg | Freq: Three times a day (TID) | INTRAMUSCULAR | Status: DC | PRN

## 2019-12-11 MED ORDER — PANTOPRAZOLE SODIUM 40 MG PO TBEC: 40.0000 mg | DELAYED_RELEASE_TABLET | Freq: Two times a day (BID) | ORAL | Status: DC | PRN

## 2019-12-11 MED ORDER — KETAMINE HCL 10 MG/ML IJ SOLN
INTRAMUSCULAR | Status: DC | PRN
  Administered 2019-12-11: 30 mg via INTRAVENOUS
  Administered 2019-12-11: 20 mg via INTRAVENOUS

## 2019-12-11 MED ORDER — ORAL CARE MOUTH RINSE: 15.0000 mL | Freq: Once | OROMUCOSAL | Status: AC

## 2019-12-11 MED ORDER — ALPRAZOLAM 0.5 MG PO TABS
1.0000 mg | ORAL_TABLET | Freq: Every evening | ORAL | Status: DC | PRN
  Administered 2019-12-11: 1 mg via ORAL
  Filled 2019-12-11: qty 2

## 2019-12-11 MED ORDER — GABAPENTIN 600 MG PO TABS
600.0000 mg | ORAL_TABLET | Freq: Two times a day (BID) | ORAL | Status: DC | PRN
  Filled 2019-12-11: qty 1

## 2019-12-11 MED ORDER — MIDAZOLAM HCL 2 MG/2ML IJ SOLN: 2.0000 mg | INTRAMUSCULAR | Status: AC

## 2019-12-11 MED ORDER — DOCUSATE SODIUM 100 MG PO CAPS
100.0000 mg | ORAL_CAPSULE | Freq: Two times a day (BID) | ORAL | Status: DC
  Administered 2019-12-11 – 2019-12-12 (×2): 100 mg via ORAL
  Filled 2019-12-11 (×2): qty 1

## 2019-12-11 MED ORDER — ONDANSETRON HCL 4 MG/2ML IJ SOLN
INTRAMUSCULAR | Status: AC
  Filled 2019-12-11: qty 2

## 2019-12-11 MED ORDER — OXYCODONE HCL 5 MG PO TABS
5.0000 mg | ORAL_TABLET | Freq: Once | ORAL | Status: AC | PRN
  Administered 2019-12-11: 5 mg via ORAL

## 2019-12-11 MED ORDER — CEFAZOLIN SODIUM-DEXTROSE 2-4 GM/100ML-% IV SOLN
2.0000 g | Freq: Four times a day (QID) | INTRAVENOUS | Status: AC
  Administered 2019-12-11 – 2019-12-12 (×3): 2 g via INTRAVENOUS
  Filled 2019-12-11 (×3): qty 100

## 2019-12-11 MED ORDER — FENTANYL CITRATE (PF) 250 MCG/5ML IJ SOLN
INTRAMUSCULAR | Status: AC
  Filled 2019-12-11: qty 5

## 2019-12-11 MED ORDER — ADULT MULTIVITAMIN W/MINERALS CH
1.0000 | ORAL_TABLET | Freq: Every day | ORAL | Status: DC
  Administered 2019-12-12: 1 via ORAL
  Filled 2019-12-11: qty 1

## 2019-12-11 MED ORDER — CHLORHEXIDINE GLUCONATE 0.12 % MT SOLN: 15.0000 mL | Freq: Once | OROMUCOSAL | Status: AC

## 2019-12-11 MED ORDER — FENTANYL CITRATE (PF) 100 MCG/2ML IJ SOLN
25.0000 ug | INTRAMUSCULAR | Status: DC | PRN
  Administered 2019-12-11 (×2): 50 ug via INTRAVENOUS

## 2019-12-11 MED ORDER — METOCLOPRAMIDE HCL 5 MG PO TABS: 5.0000 mg | ORAL_TABLET | Freq: Three times a day (TID) | ORAL | Status: DC | PRN

## 2019-12-11 MED ORDER — DEXAMETHASONE SODIUM PHOSPHATE 10 MG/ML IJ SOLN
INTRAMUSCULAR | Status: AC
  Filled 2019-12-11: qty 1

## 2019-12-11 MED ORDER — HYDROMORPHONE HCL 1 MG/ML IJ SOLN
0.2500 mg | INTRAMUSCULAR | Status: DC | PRN
  Administered 2019-12-11 (×2): 0.5 mg via INTRAVENOUS

## 2019-12-11 MED ORDER — ONDANSETRON HCL 4 MG/2ML IJ SOLN
INTRAMUSCULAR | Status: DC | PRN
  Administered 2019-12-11: 4 mg via INTRAVENOUS

## 2019-12-11 MED ORDER — FENTANYL CITRATE (PF) 250 MCG/5ML IJ SOLN
INTRAMUSCULAR | Status: DC | PRN
  Administered 2019-12-11 (×5): 50 ug via INTRAVENOUS

## 2019-12-11 MED ORDER — LIDOCAINE 2% (20 MG/ML) 5 ML SYRINGE
INTRAMUSCULAR | Status: DC | PRN
  Administered 2019-12-11: 50 mg via INTRAVENOUS

## 2019-12-11 MED ORDER — SCOPOLAMINE 1 MG/3DAYS TD PT72
MEDICATED_PATCH | TRANSDERMAL | Status: AC
  Administered 2019-12-11: 1.5 mg via TRANSDERMAL
  Filled 2019-12-11: qty 1

## 2019-12-11 MED ORDER — POLYETHYLENE GLYCOL 3350 17 G PO PACK: 17.0000 g | PACK | Freq: Every day | ORAL | Status: DC | PRN

## 2019-12-11 MED ORDER — PROMETHAZINE HCL 25 MG PO TABS: 25.0000 mg | ORAL_TABLET | Freq: Three times a day (TID) | ORAL | Status: DC | PRN

## 2019-12-11 MED ORDER — LIDOCAINE 2% (20 MG/ML) 5 ML SYRINGE
INTRAMUSCULAR | Status: AC
  Filled 2019-12-11: qty 5

## 2019-12-11 MED ORDER — SCOPOLAMINE 1 MG/3DAYS TD PT72: 1.0000 | MEDICATED_PATCH | TRANSDERMAL | Status: DC

## 2019-12-11 MED ORDER — BUPIVACAINE HCL (PF) 0.5 % IJ SOLN
INTRAMUSCULAR | Status: AC
  Filled 2019-12-11: qty 30

## 2019-12-11 MED ORDER — MORPHINE SULFATE (PF) 4 MG/ML IV SOLN
INTRAVENOUS | Status: DC | PRN
  Administered 2019-12-11: 4 mg

## 2019-12-11 MED ORDER — DEXAMETHASONE SODIUM PHOSPHATE 10 MG/ML IJ SOLN
INTRAMUSCULAR | Status: DC | PRN
  Administered 2019-12-11: 5 mg via INTRAVENOUS

## 2019-12-11 MED ORDER — MORPHINE SULFATE (PF) 4 MG/ML IV SOLN
INTRAVENOUS | Status: AC
  Filled 2019-12-11: qty 1

## 2019-12-11 MED ORDER — FENTANYL CITRATE (PF) 100 MCG/2ML IJ SOLN: 50.0000 ug | INTRAMUSCULAR | Status: AC

## 2019-12-11 MED ORDER — SODIUM CHLORIDE 0.9 % IV SOLN: INTRAVENOUS | Status: DC

## 2019-12-11 MED ORDER — ROPIVACAINE HCL 7.5 MG/ML IJ SOLN
INTRAMUSCULAR | Status: DC | PRN
  Administered 2019-12-11: 20 mL via PERINEURAL

## 2019-12-11 MED ORDER — MIDAZOLAM HCL 2 MG/2ML IJ SOLN
INTRAMUSCULAR | Status: AC
  Administered 2019-12-11: 2 mg via INTRAVENOUS
  Filled 2019-12-11: qty 2

## 2019-12-11 MED ORDER — FENTANYL CITRATE (PF) 100 MCG/2ML IJ SOLN
INTRAMUSCULAR | Status: AC
  Administered 2019-12-11: 50 ug via INTRAVENOUS
  Filled 2019-12-11: qty 2

## 2019-12-11 MED ORDER — OXYCODONE HCL 5 MG PO TABS
10.0000 mg | ORAL_TABLET | ORAL | Status: DC | PRN
  Administered 2019-12-11: 10 mg via ORAL
  Filled 2019-12-11: qty 2

## 2019-12-11 MED ORDER — ACETAMINOPHEN 325 MG PO TABS
325.0000 mg | ORAL_TABLET | Freq: Four times a day (QID) | ORAL | Status: DC | PRN
  Administered 2019-12-11: 650 mg via ORAL
  Filled 2019-12-11: qty 2

## 2019-12-11 MED ORDER — OXYCODONE HCL 5 MG/5ML PO SOLN: 5.0000 mg | Freq: Once | ORAL | Status: AC | PRN

## 2019-12-11 MED ORDER — CHLORHEXIDINE GLUCONATE 0.12 % MT SOLN
OROMUCOSAL | Status: AC
  Administered 2019-12-11: 15 mL via OROMUCOSAL
  Filled 2019-12-11: qty 15

## 2019-12-11 MED ORDER — OXYCODONE HCL 5 MG PO TABS
5.0000 mg | ORAL_TABLET | ORAL | Status: DC | PRN
  Administered 2019-12-12: 10 mg via ORAL
  Administered 2019-12-12: 5 mg via ORAL
  Filled 2019-12-11 (×2): qty 2

## 2019-12-11 MED ORDER — BUPIVACAINE HCL (PF) 0.5 % IJ SOLN
INTRAMUSCULAR | Status: DC | PRN
  Administered 2019-12-11: 20 mL

## 2019-12-11 MED ORDER — GABAPENTIN 600 MG PO TABS
600.0000 mg | ORAL_TABLET | Freq: Every day | ORAL | Status: DC
  Administered 2019-12-11: 600 mg via ORAL
  Filled 2019-12-11 (×2): qty 1

## 2019-12-11 MED ORDER — HYDROMORPHONE HCL 1 MG/ML IJ SOLN
INTRAMUSCULAR | Status: AC
  Filled 2019-12-11: qty 0.5

## 2019-12-11 SURGICAL SUPPLY — 78 items
ATTUNE PSRP INSR SZ7 8 KNEE (Insert) ×3 IMPLANT
BANDAGE ESMARK 6X9 LF (GAUZE/BANDAGES/DRESSINGS) ×2 IMPLANT
BLADE CLIPPER SURG (BLADE) IMPLANT
BNDG CMPR 9X6 STRL LF SNTH (GAUZE/BANDAGES/DRESSINGS) ×2
BNDG ELASTIC 6X5.8 VLCR STR LF (GAUZE/BANDAGES/DRESSINGS) ×5 IMPLANT
BNDG ESMARK 6X9 LF (GAUZE/BANDAGES/DRESSINGS) ×3
BOWL SMART MIX CTS (DISPOSABLE) ×3 IMPLANT
COVER SURGICAL LIGHT HANDLE (MISCELLANEOUS) ×3 IMPLANT
CUFF TOURN SGL QUICK 34 (TOURNIQUET CUFF)
CUFF TOURN SGL QUICK 42 (TOURNIQUET CUFF) ×2 IMPLANT
CUFF TRNQT CYL 34X4.125X (TOURNIQUET CUFF) IMPLANT
DISSECTOR  3.8MM X 13CM (MISCELLANEOUS) ×3
DISSECTOR 3.8MM X 13CM (MISCELLANEOUS) ×2 IMPLANT
DRAPE ARTHROSCOPY W/POUCH 114 (DRAPES) ×3 IMPLANT
DRAPE HALF SHEET 40X57 (DRAPES) ×3 IMPLANT
DRAPE IMP U-DRAPE 54X76 (DRAPES) ×3 IMPLANT
DRAPE POUCH INSTRU U-SHP 10X18 (DRAPES) ×3 IMPLANT
DRAPE U-SHAPE 47X51 STRL (DRAPES) ×3 IMPLANT
DRSG ADAPTIC 3X8 NADH LF (GAUZE/BANDAGES/DRESSINGS) ×3 IMPLANT
DRSG PAD ABDOMINAL 8X10 ST (GAUZE/BANDAGES/DRESSINGS) ×7 IMPLANT
DURAPREP 26ML APPLICATOR (WOUND CARE) ×3 IMPLANT
DW OUTFLOW CASSETTE/TUBE SET (MISCELLANEOUS) ×1 IMPLANT
ELECT REM PT RETURN 9FT ADLT (ELECTROSURGICAL) ×3
ELECTRODE REM PT RTRN 9FT ADLT (ELECTROSURGICAL) ×2 IMPLANT
FACESHIELD WRAPAROUND (MASK) ×3 IMPLANT
FACESHIELD WRAPAROUND OR TEAM (MASK) ×2 IMPLANT
GAUZE SPONGE 4X4 12PLY STRL (GAUZE/BANDAGES/DRESSINGS) ×4 IMPLANT
GAUZE XEROFORM 1X8 LF (GAUZE/BANDAGES/DRESSINGS) ×4 IMPLANT
GLOVE BIO SURGEON STRL SZ8 (GLOVE) ×3 IMPLANT
GLOVE BIOGEL PI IND STRL 8 (GLOVE) ×4 IMPLANT
GLOVE BIOGEL PI INDICATOR 8 (GLOVE) ×2
GLOVE ORTHO TXT STRL SZ7.5 (GLOVE) ×3 IMPLANT
GLOVE SURG ORTHO 8.0 STRL STRW (GLOVE) ×3 IMPLANT
GOWN STRL REUS W/ TWL LRG LVL3 (GOWN DISPOSABLE) ×4 IMPLANT
GOWN STRL REUS W/ TWL XL LVL3 (GOWN DISPOSABLE) ×8 IMPLANT
GOWN STRL REUS W/TWL LRG LVL3 (GOWN DISPOSABLE) ×6
GOWN STRL REUS W/TWL XL LVL3 (GOWN DISPOSABLE) ×12
HANDPIECE INTERPULSE COAX TIP (DISPOSABLE) ×3
IMMOBILIZER KNEE 20 (SOFTGOODS)
IMMOBILIZER KNEE 20 THIGH 36 (SOFTGOODS) IMPLANT
IMMOBILIZER KNEE 22 UNIV (SOFTGOODS) ×3 IMPLANT
IMMOBILIZER KNEE 24 THIGH 36 (MISCELLANEOUS) IMPLANT
IMMOBILIZER KNEE 24 UNIV (MISCELLANEOUS)
KIT BASIN OR (CUSTOM PROCEDURE TRAY) ×3 IMPLANT
KIT TURNOVER KIT B (KITS) ×3 IMPLANT
MANIFOLD NEPTUNE II (INSTRUMENTS) ×3 IMPLANT
NDL SPNL 18GX3.5 QUINCKE PK (NEEDLE) ×1 IMPLANT
NEEDLE SPNL 18GX3.5 QUINCKE PK (NEEDLE) ×3 IMPLANT
NS IRRIG 1000ML POUR BTL (IV SOLUTION) ×3 IMPLANT
PACK ARTHROSCOPY DSU (CUSTOM PROCEDURE TRAY) ×1 IMPLANT
PACK TOTAL JOINT (CUSTOM PROCEDURE TRAY) ×3 IMPLANT
PACK UNIVERSAL I (CUSTOM PROCEDURE TRAY) ×1 IMPLANT
PAD ARMBOARD 7.5X6 YLW CONV (MISCELLANEOUS) ×6 IMPLANT
PADDING CAST ABS 6INX4YD NS (CAST SUPPLIES) ×1
PADDING CAST ABS COTTON 6X4 NS (CAST SUPPLIES) ×1 IMPLANT
PADDING CAST COTTON 6X4 STRL (CAST SUPPLIES) ×4 IMPLANT
PORT APPOLLO RF 90DEGREE MULTI (SURGICAL WAND) IMPLANT
SET HNDPC FAN SPRY TIP SCT (DISPOSABLE) ×2 IMPLANT
SPONGE LAP 18X18 RF (DISPOSABLE) IMPLANT
SPONGE LAP 4X18 RFD (DISPOSABLE) ×3 IMPLANT
STRIP CLOSURE SKIN 1/2X4 (GAUZE/BANDAGES/DRESSINGS) ×6 IMPLANT
SUCTION FRAZIER HANDLE 10FR (MISCELLANEOUS) ×3
SUCTION TUBE FRAZIER 10FR DISP (MISCELLANEOUS) ×2 IMPLANT
SUT ETHILON 2 0 PSLX (SUTURE) ×6 IMPLANT
SUT ETHILON 3 0 PS 1 (SUTURE) ×3 IMPLANT
SUT MNCRL AB 4-0 PS2 18 (SUTURE) ×3 IMPLANT
SUT VIC AB 0 CT1 36 (SUTURE) ×1 IMPLANT
SUT VIC AB 1 CT1 27 (SUTURE) ×15
SUT VIC AB 1 CT1 27XBRD ANBCTR (SUTURE) ×6 IMPLANT
SUT VIC AB 2-0 CT1 27 (SUTURE) ×12
SUT VIC AB 2-0 CT1 TAPERPNT 27 (SUTURE) ×7 IMPLANT
SYR 50ML SLIP (SYRINGE) ×3 IMPLANT
TIP HIGH FLOW IRRIGATION COAX (MISCELLANEOUS) ×2 IMPLANT
TOWEL GREEN STERILE (TOWEL DISPOSABLE) ×3 IMPLANT
TOWEL GREEN STERILE FF (TOWEL DISPOSABLE) ×3 IMPLANT
TRAY FOLEY MTR SLVR 16FR STAT (SET/KITS/TRAYS/PACK) ×3 IMPLANT
TUBING ARTHROSCOPY IRRIG 16FT (MISCELLANEOUS) ×3 IMPLANT
WATER STERILE IRR 1000ML POUR (IV SOLUTION) ×3 IMPLANT

## 2019-12-11 NOTE — Op Note (Signed)
NAME: Victoria [name], Victoria [name] ZO:[id number] ACCOUNT [id number] DATE OF BIRTH:November 08, 1960 FACILITY: [facility] LOCATION: [facility] PHYSICIAN:CHRISTOPHER Kerry Fort, MD  OPERATIVE REPORT  DATE OF PROCEDURE:  12/11/2019  PREOPERATIVE DIAGNOSES: 1.  Right knee acute medial meniscal tear. 2.  Left knee polyethylene liner wear and slight instability.  POSTOPERATIVE DIAGNOSES:   1.  Right knee acute medial meniscal tear. 2.  Left knee polyethylene liner wear and slight instability.  PROCEDURE: 1.  Right knee arthroscopy with partial medial meniscectomy. 2.  Polyethylene liner exchange, left knee  with upsizing the poly from 7 mm to an 8 mm thickness insert.  SURGEON:  Lind Guest.  Ninfa Linden, MD  ASSISTANT:  Erskine Emery, PA-C  ANESTHESIA:  General.  ESTIMATED BLOOD LOSS:  100 mL.  TOURNIQUET TIME:  Less than 1 hour.  COMPLICATIONS:  None.  INDICATIONS:  The patient is a 59 year old female with an acute meniscal tear of her right knee, as well as some arthritic change in the medial compartment of the knee.  She also had a left total knee arthroplasty done in 2019 by one of my colleagues in  town.  She is morbidly obese and has had some slight instability symptoms of her left knee.  She has had a recurrent effusion with no evidence of infection and no evidence of loosening of the components themselves.  She has some slight valgus  malalignment of the knee.  There is a little bit of play with varus and valgus stressing and rotation at the same time.  At this point, with very conservative treatment, including activity modification and rest as well as quad strengthening exercises and  attempts at weight loss, we have recommended at least sizing her liner on her left knee and then a right knee arthroscopy with a partial medial meniscectomy.  We felt like we can accomplish these both in the same setting.  The risks and benefits of  surgery including the risk of acute blood loss  anemia, nerve or vessel injury, infection and DVT were noted.  She understands our goals are hopefully to decrease pain, improve mobility and overall improve quality of life.  DESCRIPTION OF PROCEDURE:  After informed consent was obtained, the appropriate right knee was marked for arthroscopy and left knee was marked polyethylene liner exchange.  She was brought to the operating room and placed on the operating table.  General  anesthesia was then obtained.  We proceeded with the right knee first.  We prepped the right knee with DuraPrep and sterile drapes including a sterile stockinette.  The bed was raised and a lateral leg post utilized and the right operative knee was  flexed off the side table.  A time-out was called and she was identified as correct patient, correct right knee.  I then made an anterolateral arthroscopy portal and inserted a cannula in the knee and drained a large effusion from the knee.  We then  placed the camera in the knee and went to medial compartment.  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  We did find a complex posterior horn to  midbody medial meniscal tear and we were able to perform a partial medial meniscectomy of this area   using arthroscopic shaver and basket forceps biters.  We were able to bring this back to a stable margin, but still leaving a good meniscal tissue.   There was some synovitis in the  medial gutter that we debrided as well.  We also debrided the inflamed Hoffa's fat pad.  The ACL and PCL were intact and the lateral compartment of the knee did not have any significant wear.  The patellofemoral joint did  show some grade III chondromalacia of the patella.  We then allowed fluid lavage of the knee and drained all the fluid from the knee.  We closed the portal sites with interrupted nylon suture and placed Xeroform well-padded sterile dressing.  We did   insert some Marcaine and morphine into the right knee joint.  Attention was then turned to the left knee.  We did break down the drapes to place a sterile tourniquet around her upper left thigh.  Her left thigh, knee, leg, ankle and foot were prepped  again with DuraPrep and sterile drapes including a sterile stockinette.  The surgeon and physician assistant did regown and gloved on the back table and the scrub tech remained stable.  We then used an Esmarch to wrap the left leg and tourniquet was  inflated to 300 mm of pressure.  We then ellipsed out her old incision over the knee and carried this proximally and distally.  We dissected down to the knee joint and carried out a medial parapatellar arthrotomy and did find a moderate joint effusion,  but no evidence of infection.  There was some synovitis in the knee.  On examining the knee, there was a little bit of play with rotation and varus and valgus stressing with the knee at 30 degrees and I felt that this was contributing to some of her  slight instability symptoms and creating this effusion.  We removed her previous poly liner and did remove some debris from the back of the knee.  We then trialed just only up to an 8 mm thickness on the poly because of how tight she was.  That gave her  much more stability surprisingly just going up 1 size.  We then removed that trial component removed more scar tissue from the knee and irrigated the knee with normal saline solution using pulsatile lavage.  We then placed our real fixed bearing 8 mm  thickness polyethylene insert and again we were pleased with stability and how tight the knee seemed.  We then irrigated the knee one more and closed the arthrotomy with interrupted #1 Vicryl suture, followed by 0 Vicryl to close deep tissue, 2-0 Vicryl  to close subcutaneous tissue.  The skin was reapproximated with 2-0 nylon suture.  Xeroform well-padded sterile dressing was applied.  The patient was awakened and  extubated and taken to recovery room in stable condition with all final counts being  correct.  No complications noted.  Of note, Benita Stabile, PA-C, assisted during the entire case.  His assistance was crucial for facilitating all aspects of this case.  VN/NUANCE  D:12/11/2019 T:12/11/2019 JOB:[id number]

## 2019-12-11 NOTE — Anesthesia Preprocedure Evaluation (Signed)
Anesthesia Evaluation  Patient identified by MRN, date of birth, ID band Patient awake    Reviewed: Allergy & Precautions, H&P , NPO status , Patient's Chart, lab work & pertinent test results  History of Anesthesia Complications (+) PONV and history of anesthetic complications  Airway Mallampati: II   Neck ROM: full    Dental   Pulmonary neg pulmonary ROS,    breath sounds clear to auscultation       Cardiovascular negative cardio ROS   Rhythm:regular Rate:Normal     Neuro/Psych  Headaches,  Neuromuscular disease    GI/Hepatic GERD  ,  Endo/Other  Hypothyroidism   Renal/GU      Musculoskeletal  (+) Arthritis ,   Abdominal   Peds  Hematology  (+) Blood dyscrasia, anemia ,   Anesthesia Other Findings   Reproductive/Obstetrics                             Anesthesia Physical Anesthesia Plan  ASA: II  Anesthesia Plan: General   Post-op Pain Management:  Regional for Post-op pain   Induction: Intravenous  PONV Risk Score and Plan: 4 or greater and Ondansetron, Dexamethasone, Midazolam, Treatment may vary due to age or medical condition and Scopolamine patch - Pre-op  Airway Management Planned: LMA  Additional Equipment:   Intra-op Plan:   Post-operative Plan: Extubation in OR  Informed Consent: I have reviewed the patients History and Physical, chart, labs and discussed the procedure including the risks, benefits and alternatives for the proposed anesthesia with the patient or authorized representative who has indicated his/her understanding and acceptance.       Plan Discussed with: CRNA, Anesthesiologist and Surgeon  Anesthesia Plan Comments:         Anesthesia Quick Evaluation

## 2019-12-11 NOTE — Anesthesia Procedure Notes (Signed)
Anesthesia Regional Block: Adductor canal block   Pre-Anesthetic Checklist: ,, timeout performed, Correct Patient, Correct Site, Correct Laterality, Correct Procedure, Correct Position, site marked, Risks and benefits discussed,  Surgical consent,  Pre-op evaluation,  At surgeon's request and post-op pain management  Laterality: Left  Prep: chloraprep       Needles:  Injection technique: Single-shot  Needle Type: Echogenic Needle     Needle Length: 9cm  Needle Gauge: 21     Additional Needles:   Narrative:  Start time: 12/11/2019 10:54 AM End time: 12/11/2019 11:03 AM Injection made incrementally with aspirations every 5 mL.  Performed by: Personally  Anesthesiologist: Albertha Ghee, MD  Additional Notes: Pt tolerated the procedure well.

## 2019-12-11 NOTE — Anesthesia Procedure Notes (Signed)
Procedure Name: LMA Insertion Date/Time: 12/11/2019 12:11 PM Performed by: Wilburn Cornelia, CRNA Pre-anesthesia Checklist: Patient identified, Emergency Drugs available, Suction available, Patient being monitored and Timeout performed Patient Re-evaluated:Patient Re-evaluated prior to induction Oxygen Delivery Method: Circle system utilized Preoxygenation: Pre-oxygenation with 100% oxygen Induction Type: IV induction Ventilation: Mask ventilation without difficulty LMA: LMA inserted LMA Size: 4.0 Number of attempts: 1 Placement Confirmation: breath sounds checked- equal and bilateral and positive ETCO2 Tube secured with: Tape Dental Injury: Teeth and Oropharynx as per pre-operative assessment

## 2019-12-11 NOTE — H&P (Signed)
Victoria [name] is an 59 y.o. female.   Chief Complaint:   1)  Right knee pain, swelling, locking and catching            2)  Left knee pain and instability HPI: The patient is a 59 year old female who is presenting for surgery today as a relates to both her right and left knees.  She has been having right knee pain with swelling and locking and catching.  I MRI did show a complex medial meniscal tear of the right knee.  She had a left total knee arthroplasty a few years ago by one of my colleagues in town.  That knee has some instability on exam and I have recommended upsizing her polyliner to allow for some more stability.  We have decided to proceed with both surgeries today at once with arthroscopic surgery on the right knee and the polyliner exchange on the left knee.  She has discussed this in detail with this.  Both knees have such symptomatic findings that there detrimentally affecting her mobility and her quality of life to the point she does wish to proceed with surgery on both knees today.  We have recommended this as well.  Past Medical History:  Diagnosis Date  . Bursitis    right shoulder  . Chronic leukopenia    intermittant since 2010, followed by Dr. Renold Genta now  . Degenerative joint disease of low back 09/02/15    Dr. Maureen Ralphs  . Fibrocystic breast   . GERD (gastroesophageal reflux disease)   . Headache    per pt more stress/tension  . Heart palpitations    SEE EPIC ENCOUTNER , CARDIOLOGY DR. Tressia Miners TURNER 2018; reports on 05-16-17 " i haven't had any bouts of those lately"    . Hemorrhoids   . History of Clostridium difficile infection 12/2010  . History of exercise intolerance    ETT on 04-27-2016-- negative ([facility] treadmill score 7)  . History of Helicobacter pylori infection 2001 and 09/ 2012  . HSV-2 infection    genital  . Hx of adenomatous colonic polyps 10/17/2007  . Hydradenitis    per pt currently treated with Humira injection  . Hypothyroidism,  postsurgical 1980  . Medial meniscus tear    right knee  . Osteoarthritis   . Pernicious anemia    b12 def  . PONV (postoperative nausea and vomiting)   . 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 Tendonitis    right shoulder  . Varicose veins   . Vitamin B 12 deficiency   . Vitamin D deficiency     Past Surgical History:  Procedure Laterality Date  . BREATH TEK H PYLORI N/A 05/03/2014   Procedure: BREATH TEK H PYLORI;  Surgeon: Kaylyn Lim, MD;  Location: [facility];  Service: General;  Laterality: N/A;  . CARDIOVASCULAR STRESS TEST  03/11/2013   dr Tressia Miners turner   Low risk nuclear study w/ a very small anterior perfusion defect that most likely represents breast attenuation artifact, less likely this could represent a true perfusion abnormality in the distribution of diagonal branch of the LAD/  normal LV function and wall motion , ef 66%  . CATARACT EXTRACTION W/ INTRAOCULAR LENS  IMPLANT, BILATERAL  10/2017  . CESAREAN SECTION  1985  . CHOLECYSTECTOMY OPEN  1987  . COLONOSCOPY  02/2017   Dr [name] ; per patient they found 7 polyps with polypectomy; on  5 year track   . FINE NEEDLE ASPIRATION Left 05/22/2017   Procedure: LEFT KNEE ASPIRATION;  Surgeon: Gaynelle Arabian, MD;  Location: [facility];  Service: Orthopedics;  Laterality: Left;  Marland Kitchen GASTRIC ROUX-EN-Y N/A 07/06/2014   Procedure: LAPAROSCOPIC ROUX-EN-Y GASTRIC BYPASS, ENTEROLYSIS OF ADHESIONS WITH UPPER ENDOSCOPY;  Surgeon: Johnathan Hausen, MD;  Location: [facility];  Service: General;  Laterality: N/A;  . HAMMER TOE SURGERY Bilateral 02/17/2008   bilateral foot digit's 2 and 5  . HIATAL HERNIA REPAIR N/A 07/06/2014   Procedure: LAPAROSCOPIC REPAIR OF HIATAL HERNIA;  Surgeon: Johnathan Hausen, MD;  Location: [facility];  Service: General;  Laterality: N/A;  . KNEE ARTHROSCOPY WITH MEDIAL MENISECTOMY Left 07/17/2017   Procedure: LEFT KNEE ARTHROSCOPY WITH MEDIAL  LATERAL MENISECTOMY;  Surgeon: Gaynelle Arabian, MD;  Location: [facility];  Service: Orthopedics;  Laterality: Left;  Marland Kitchen MASS EXCISION N/A 10/11/2016   Procedure: EXCISION OF PERIANAL MASS;  Surgeon: [name], MD;  Location: [facility];  Service: General;  Laterality: N/A;  . REFRACTIVE SURGERY    . THYROIDECTOMY  1980  . TOTAL ABDOMINAL HYSTERECTOMY  08-25-2002   dr Margaretha Glassing   ovaries retained  . TOTAL HIP ARTHROPLASTY Right 05/21/2012   Procedure: TOTAL HIP ARTHROPLASTY;  Surgeon: Gearlean Alf, MD;  Location: [facility];  Service: Orthopedics;  Laterality: Right;  . TOTAL HIP ARTHROPLASTY Left 01-31-2009  dr Wynelle Link  . TOTAL HIP REVISION Left 05/22/2017   Procedure: Left hip bearing surface and head revision;  Surgeon: Gaynelle Arabian, MD;  Location: [facility];  Service: Orthopedics;  Laterality: Left;  . TOTAL KNEE ARTHROPLASTY Left 01/20/2018   Procedure: LEFT TOTAL KNEE ARTHROPLASTY;  Surgeon: Gaynelle Arabian, MD;  Location: [facility];  Service: Orthopedics;  Laterality: Left;  72min  . TRANSTHORACIC ECHOCARDIOGRAM  05/03/2016   ef 55-60%/  trivial MR/  mild TR  . TUBAL LIGATION      Family History  Problem Relation Age of Onset  . Diabetes Father   . Diabetes Sister   . Hypertension Sister   . Hypertension Mother   . Headache Mother    Social History:  reports that she has never smoked. She has never used smokeless tobacco. She reports that she does not drink alcohol and does not use drugs.  Allergies:  Allergies  Allergen Reactions  . 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 Estradiol Rash and Other (See Comments)    Local rash with estradiol patches    Medications Prior to Admission  Medication Sig Dispense Refill  . Adalimumab (HUMIRA PEN) 40 MG/0.8ML PNKT Inject one 40mg /0.8 ml pen subq qweek (Patient taking differently: Inject 40 mg into the skin every Monday. ) 4 each 5  . ALPRAZolam (XANAX) 1 MG tablet TAKE  1 TABLET BY MOUTH EVERY NIGHT AT BEDTIME (Patient taking differently: Take 1 mg by mouth at bedtime as needed for sleep. ) 90 tablet 0  . Calcium Carb-Cholecalciferol (CALCIUM + D3 PO) Take 1 tablet by mouth daily.    . cyanocobalamin (,VITAMIN B-12,) 1000 MCG/ML injection INJECT 1 ML INTRAMUSCULARLY EVERY MONTH (Patient taking differently: Inject 1,000 mcg into the muscle every 30 (thirty) days. INJECT 1 ML INTRAMUSCULARLY EVERY MONTH) 3 mL 11  . cyclobenzaprine (FLEXERIL) 10 MG tablet TAKE 1 TABLET BY MOUTH AT BEDTIME (Patient taking differently: Take 10 mg by mouth daily as needed for muscle spasms. ) 90 tablet 3  . DIVIGEL 1 MG/GM  GEL APPLY TO THE SKIN AT BEDTIME (Patient taking differently: Apply 1 application topically at bedtime. ) 90 g 4  . gabapentin (NEURONTIN) 600 MG tablet TAKE 1 TABLET BY MOUTH 3 TIMES DAILY. (Patient taking differently: Take 600 mg by mouth See admin instructions. Take 1 tablet (600 mg) by mouth scheduled every night, may take 2 additional doses during the day if needed for pain.) 90 tablet 2  . HYDROcodone-acetaminophen (NORCO) 5-325 MG tablet Take 1-2 tablets by mouth every 6 (six) hours as needed for moderate pain. (Patient taking differently: Take 1-2 tablets by mouth every 6 (six) hours as needed for moderate pain (knee pain.). ) 25 tablet 0  . levothyroxine (SYNTHROID) 112 MCG tablet TAKE 1 TABLET BY MOUTH ONCE A DAY (Patient taking differently: Take 112 mcg by mouth daily at 2 am. During nighttime bathroom break) 90 tablet 2  . pantoprazole (PROTONIX) 40 MG tablet TAKE 1 TABLET BY MOUTH TWICE DAILY--- PRN (Patient taking differently: Take 40 mg by mouth 2 (two) times daily as needed (indigestion/acid reflux.). )    . polyethylene glycol (MIRALAX / GLYCOLAX) packet Take 17 g by mouth daily as needed for mild constipation.    . promethazine (PHENERGAN) 25 MG tablet TAKE 1 TABLET BY MOUTH EVERY 8 HOURS AS NEEDED FOR NAUSEA AND VOMITING (Patient taking differently: Take 25  mg by mouth every 8 (eight) hours as needed for nausea or vomiting. ) 30 tablet 5  . Rimegepant Sulfate (NURTEC) 75 MG TBDP Take 75 mg by mouth daily as needed. For migraines. Take as close to onset of migraine as possible. One daily maximum. (Patient taking differently: Take 75 mg by mouth daily as needed (onset of migraine (Max 1 tablet/24hr)). For migraines. Take as close to onset of migraine as possible. One daily maximum.) 10 tablet 11  . traZODone (DESYREL) 50 MG tablet Take 1 tablet (50 mg total) by mouth at bedtime. (Patient taking differently: Take 50 mg by mouth at bedtime as needed for sleep. ) 30 tablet 1  . valACYclovir (VALTREX) 500 MG tablet TAKE 1 TABLET BY MOUTH ONCE DAILY (Patient taking differently: Take 500 mg by mouth daily as needed (outbreaks). ) 90 tablet 3  . estradiol (ESTRING) 2 MG vaginal ring Place 2 mg vaginally every 3 (three) months. follow package directions 1 each 3  . Multiple Vitamin (MULTIVITAMIN WITH MINERALS) TABS tablet Take 1 tablet by mouth daily.      Results for orders placed or performed during the hospital encounter of 12/09/19 (from the past 48 hour(s))  SARS CORONAVIRUS 2 (TAT 6-24 HRS) Nasopharyngeal Nasopharyngeal Swab     Status: None   Collection Time: 12/09/19  1:44 PM   Specimen: Nasopharyngeal Swab  Result Value Ref Range   SARS Coronavirus 2 NEGATIVE NEGATIVE    Comment: (NOTE) SARS-CoV-2 target nucleic acids are NOT DETECTED.  The SARS-CoV-2 RNA is generally detectable in upper and lower respiratory specimens during the acute phase of infection. Negative results do not preclude SARS-CoV-2 infection, do not rule out co-infections with other pathogens, and should not be used as the sole basis for treatment or other patient management decisions. Negative results must be combined with clinical observations, patient history, and epidemiological information. The expected result is Negative.  Fact Sheet for  Patients: SugarRoll.be  Fact Sheet for Healthcare Providers: https://www.woods-mathews.com/  This test is not yet approved or cleared by the [place] FDA and  has been authorized for detection and/or diagnosis of SARS-CoV-2 by FDA under an  Emergency Use Authorization (EUA). This EUA will remain  in effect (meaning this test can be used) for the duration of the COVID-19 declaration under Se ction 564(b)(1) of the Act, 21 U.S.C. section 360bbb-3(b)(1), unless the authorization is terminated or revoked sooner.  Performed at [facility], [place] [address], [place], [place] [address]    No results found.  Review of Systems  Musculoskeletal: Positive for gait problem and joint swelling.  All other systems reviewed and are negative.   There were no vitals taken for this visit. Physical Exam Vitals reviewed.  Constitutional:      Appearance: Normal appearance.  HENT:     Head: Normocephalic and atraumatic.  Eyes:     Extraocular Movements: Extraocular movements intact.     Pupils: Pupils are equal, round, and reactive to light.  Cardiovascular:     Rate and Rhythm: Normal rate and regular rhythm.     Pulses: Normal pulses.  Pulmonary:     Breath sounds: Normal breath sounds.  Abdominal:     Palpations: Abdomen is soft.  Musculoskeletal:     Cervical back: Normal range of motion.     Right knee: Swelling, effusion and bony tenderness present. Decreased range of motion. Tenderness present over the medial joint line. Abnormal meniscus.     Left knee: Swelling present. LCL laxity and MCL laxity present.  Neurological:     Mental Status: She is alert and oriented to person, place, and time.  Psychiatric:        Behavior: Behavior normal.      Assessment/Plan 1) right knee with medial meniscal tear 2) left knee with polyliner wear and instability  Our plan is to proceed to surgery today for a right knee arthroscopy with  partial medial meniscectomy and the left knee polyliner exchange with upsizing the poly-.  The risk and benefits of surgery been explained in detail.  Operative and nonoperative modalities have also been discussed.  Mcarthur Rossetti, MD 12/11/2019, 9:26 AM

## 2019-12-11 NOTE — Transfer of Care (Signed)
Immediate Anesthesia Transfer of Care Note  Patient: Victoria [name]  Procedure(s) Performed: LEFT KNEE POLY-LINER EXCHANGE (Left Knee) RIGHT KNEE ARTHROSCOPY WITH PARTIAL MEDIAL MENISCECTOMY (Right Knee)  Patient Location: PACU  Anesthesia Type:General and Regional  Level of Consciousness: awake and patient cooperative  Airway & Oxygen Therapy: Patient Spontanous Breathing  Post-op Assessment: Report given to RN and Post -op Vital signs reviewed and stable  Post vital signs: Reviewed and stable  Last Vitals:  Vitals Value Taken Time  BP 149/93 12/11/19 1449  Temp    Pulse 81 12/11/19 1451  Resp 11 12/11/19 1451  SpO2 97 % 12/11/19 1451  Vitals shown include unvalidated device data.  Last Pain:  Vitals:   12/11/19 1105  TempSrc:   PainSc: 5       Patients Stated Pain Goal: 5 (97/35/32 9924)  Complications: No complications documented.

## 2019-12-11 NOTE — Discharge Instructions (Signed)
Increase your activities as comfort allows. You may put all of your weight on both of your legs. Expect bilateral knee swelling -ice and elevation as needed. You can get your right knee incisions wet in the shower daily and then place Band-Aids over each incision daily.  You can get the dressing on your left knee wet in the shower daily but do leave the dressing on for at least 6 days and then you can change it once with a new similar dressing prior to being seen in follow-up.

## 2019-12-11 NOTE — Brief Op Note (Signed)
12/11/2019  2:23 PM  PATIENT:  Victoria [name]  59 y.o. female  PRE-OPERATIVE DIAGNOSIS:  right knee medial meniscal tear, left knee poly-liner wear  POST-OPERATIVE DIAGNOSIS:  right knee medial meniscal tear, left knee poly-liner wear  PROCEDURE:  Procedure(s): LEFT KNEE POLY-LINER EXCHANGE (Left) RIGHT KNEE ARTHROSCOPY WITH PARTIAL MEDIAL MENISCECTOMY (Right)  SURGEON:  Surgeon(s) and Role:    Mcarthur Rossetti, MD - Primary  PHYSICIAN ASSISTANT:  Benita Stabile, PA-C  ANESTHESIA:   general  COUNTS:  YES  TOURNIQUET:   Total Tourniquet Time Documented: Thigh (Left) - 49 minutes Total: Thigh (Left) - 49 minutes   DICTATION: .Other Dictation: Dictation Number [id number]  PLAN OF CARE: Admit for overnight observation  PATIENT DISPOSITION:  PACU - hemodynamically stable.   Delay start of Pharmacological VTE agent (>24hrs) due to surgical blood loss or risk of bleeding: no

## 2019-12-11 NOTE — Progress Notes (Signed)
Patient arrived to unit from PACU A/O/V, oriented to room, Skin clean/dry/intact. No complaints voiced at this time. Will continue to monitor.

## 2019-12-11 NOTE — Plan of Care (Signed)

## 2019-12-12 ENCOUNTER — Other Ambulatory Visit (HOSPITAL_COMMUNITY): Payer: Self-pay | Admitting: Orthopaedic Surgery

## 2019-12-12 DIAGNOSIS — S83241A Other tear of medial meniscus, current injury, right knee, initial encounter: Secondary | ICD-10-CM | POA: Diagnosis not present

## 2019-12-12 MED ORDER — ASPIRIN 81 MG PO CHEW
81.0000 mg | CHEWABLE_TABLET | Freq: Two times a day (BID) | ORAL | 0 refills | Status: DC
Start: 2019-12-12 — End: 2020-01-05

## 2019-12-12 MED ORDER — OXYCODONE HCL 5 MG PO TABS: 5.0000 mg | ORAL_TABLET | Freq: Four times a day (QID) | ORAL | 0 refills | Status: DC | PRN

## 2019-12-12 MED ORDER — OXYCODONE HCL 5 MG PO TABS
5.0000 mg | ORAL_TABLET | Freq: Four times a day (QID) | ORAL | 0 refills | Status: DC | PRN
Start: 2019-12-12 — End: 2020-01-05

## 2019-12-12 MED FILL — CYCLOBENZAPRINE HCL 10 MG T: 10 | 90 days supply | Qty: 90 | Fill #1

## 2019-12-12 MED FILL — oxyCODONE HCL 5 MG TABS: 5 | 4 days supply | Qty: 30 | Fill #0

## 2019-12-12 MED FILL — traZODone HCL 50 MG TABS: 50 | 30 days supply | Qty: 30 | Fill #1

## 2019-12-12 MED FILL — ASPIRIN LOW DOSE 81 MG CHEW: 81 | 30 days supply | Qty: 30 | Fill #0

## 2019-12-12 MED FILL — LEVOTHYROXINE SODIUM 112 MC: 112 | 90 days supply | Qty: 90 | Fill #1

## 2019-12-12 NOTE — Discharge Summary (Signed)
Patient ID: Victoria [name] MRN: [id number] DOB/AGE: January 10, 1961 59 y.o.  Admit date: 12/11/2019 Discharge date: 12/12/2019  Admission Diagnoses:  Principal Problem:   Acute medial meniscal tear, right, subsequent encounter Active Problems:   Status post arthroscopy of right knee   Discharge Diagnoses:  Same  Past Medical History:  Diagnosis Date  . Bursitis    right shoulder  . Chronic leukopenia    intermittant since 2010, followed by Dr. Renold Genta now  . Degenerative joint disease of low back 09/02/15    Dr. Maureen Ralphs  . Fibrocystic breast   . GERD (gastroesophageal reflux disease)   . Headache    per pt more stress/tension  . Heart palpitations    SEE EPIC ENCOUTNER , CARDIOLOGY DR. Tressia Miners TURNER 2018; reports on 05-16-17 " i haven't had any bouts of those lately"    . Hemorrhoids   . History of Clostridium difficile infection 12/2010  . History of exercise intolerance    ETT on 04-27-2016-- negative ([facility] treadmill score 7)  . History of Helicobacter pylori infection 2001 and 09/ 2012  . HSV-2 infection    genital  . Hx of adenomatous colonic polyps 10/17/2007  . Hydradenitis    per pt currently treated with Humira injection  . Hypothyroidism, postsurgical 1980  . Medial meniscus tear    right knee  . Osteoarthritis   . Pernicious anemia    b12 def  . PONV (postoperative nausea and vomiting)   . 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 Tendonitis    right shoulder  . Varicose veins   . Vitamin B 12 deficiency   . Vitamin D deficiency     Surgeries: Procedure(s): LEFT KNEE POLY-LINER EXCHANGE RIGHT KNEE ARTHROSCOPY WITH PARTIAL MEDIAL MENISCECTOMY on 12/11/2019   Consultants:   Discharged Condition: Improved  Hospital Course: Victoria [name] is an 59 y.o. female who was admitted 12/11/2019 for operative treatment ofAcute medial meniscal tear, right,  subsequent encounter. Patient has severe unremitting pain that affects sleep, daily activities, and work/hobbies. After pre-op clearance the patient was taken to the operating room on 12/11/2019 and underwent  Procedure(s): LEFT KNEE POLY-LINER EXCHANGE RIGHT KNEE ARTHROSCOPY WITH PARTIAL MEDIAL MENISCECTOMY.    Patient was given perioperative antibiotics:  Anti-infectives (From admission, onward)   Start     Dose/Rate Route Frequency Ordered Stop   12/11/19 1715  ceFAZolin (ANCEF) IVPB 2g/100 mL premix        2 g 200 mL/hr over 30 Minutes Intravenous Every 6 hours 12/11/19 1626 12/12/19 0617   12/11/19 0600  ceFAZolin (ANCEF) 3 g in dextrose 5 % 50 mL IVPB        3 g 100 mL/hr over 30 Minutes Intravenous On call to O.R. 12/10/19 0755 12/11/19 1243   12/10/19 0800  ceFAZolin (ANCEF) 3 g in dextrose 5 % 50 mL IVPB  Status:  Discontinued        3 g 100 mL/hr over 30 Minutes Intravenous On call to O.R. 12/10/19 6834 12/10/19 0755       Patient was given sequential compression devices, early ambulation, and chemoprophylaxis to prevent DVT.  Patient benefited maximally from hospital stay and there were no complications.    Recent vital signs:  Patient Vitals for the past 24 hrs:  BP Temp Temp src Pulse Resp SpO2  12/12/19 0750 110/65 98.3 F (36.8 C) Oral 73 16 99 %  12/12/19 0410  115/75 97.7 F (36.5 C) Oral 75 17 99 %  12/11/19 2021 132/89 97.6 F (36.4 C) Oral 90 16 100 %  12/11/19 1623 132/82 -- -- 80 18 97 %  12/11/19 1623 132/82 98 F (36.7 C) Oral 80 16 97 %  12/11/19 1608 (!) 143/80 (!) 97 F (36.1 C) -- 71 14 100 %  12/11/19 1555 (!) 141/83 -- -- 70 13 100 %  12/11/19 1539 (!) 152/91 -- -- 78 13 100 %  12/11/19 1534 -- -- -- 80 14 100 %  12/11/19 1521 (!) 155/107 -- -- 80 17 100 %  12/11/19 1504 (!) 158/104 -- -- 80 10 98 %  12/11/19 1449 (!) 149/93 (!) 97.5 F (36.4 C) -- 82 12 99 %  12/11/19 1110 126/75 -- -- 67 10 100 %  12/11/19 1105 122/78 -- -- 62 13 100 %   12/11/19 1100 (!) 114/99 -- -- 66 13 100 %  12/11/19 1055 130/78 -- -- 67 14 100 %  12/11/19 1051 (!) 112/55 -- -- 62 10 100 %  12/11/19 1050 -- -- -- 66 12 100 %  12/11/19 0936 135/80 98.3 F (36.8 C) Oral 76 17 100 %     Recent laboratory studies:  Recent Labs    12/11/19 0927 12/11/19 1743  WBC 3.6*  --   HGB 12.3  --   HCT 37.1  --   PLT 218  --   CREATININE  --  0.86     Discharge Medications:   Allergies as of 12/12/2019      Reactions   Other Other (See Comments)   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   Estradiol Rash, Other (See Comments)   Local rash with estradiol patches      Medication List    STOP taking these medications   HYDROcodone-acetaminophen 5-325 MG tablet Commonly known as: Norco     TAKE these medications   ALPRAZolam 1 MG tablet Commonly known as: XANAX TAKE 1 TABLET BY MOUTH EVERY NIGHT AT BEDTIME What changed:   when to take this  reasons to take this   aspirin 81 MG chewable tablet Chew 1 tablet (81 mg total) by mouth 2 (two) times daily after a meal.   CALCIUM + D3 PO Take 1 tablet by mouth daily.   cyanocobalamin 1000 MCG/ML injection Commonly known as: (VITAMIN B-12) INJECT 1 ML INTRAMUSCULARLY EVERY MONTH What changed:   how much to take  how to take this  when to take this   cyclobenzaprine 10 MG tablet Commonly known as: FLEXERIL TAKE 1 TABLET BY MOUTH AT BEDTIME What changed:   when to take this  reasons to take this   Divigel 1 MG/GM Gel Generic drug: Estradiol APPLY TO THE SKIN AT BEDTIME What changed: See the new instructions.   Estring 2 MG vaginal ring Generic drug: estradiol Place 2 mg vaginally every 3 (three) months. follow package directions   gabapentin 600 MG tablet Commonly known as: NEURONTIN TAKE 1 TABLET BY MOUTH 3 TIMES DAILY. What changed:   when to take this  additional instructions   Humira Pen 40 MG/0.8ML Pnkt Generic drug: Adalimumab Inject one  40mg /0.8 ml pen subq qweek What changed:   how much to take  how to take this  when to take this  additional instructions   levothyroxine 112 MCG tablet Commonly known as: SYNTHROID TAKE 1 TABLET BY MOUTH ONCE A DAY What changed:   when to take this  additional instructions   multivitamin with minerals Tabs tablet Take 1 tablet by mouth daily.   Nurtec 75 MG Tbdp Generic drug: Rimegepant Sulfate Take 75 mg by mouth daily as needed. For migraines. Take as close to onset of migraine as possible. One daily maximum. What changed: reasons to take this   oxyCODONE 5 MG immediate release tablet Commonly known as: Oxy IR/ROXICODONE Take 1-2 tablets (5-10 mg total) by mouth every 6 (six) hours as needed for moderate pain (pain score 4-6).   pantoprazole 40 MG tablet Commonly known as: PROTONIX TAKE 1 TABLET BY MOUTH TWICE DAILY--- PRN What changed:   how much to take  how to take this  when to take this  reasons to take this  additional instructions   polyethylene glycol 17 g packet Commonly known as: MIRALAX / GLYCOLAX Take 17 g by mouth daily as needed for mild constipation.   promethazine 25 MG tablet Commonly known as: PHENERGAN TAKE 1 TABLET BY MOUTH EVERY 8 HOURS AS NEEDED FOR NAUSEA AND VOMITING What changed: See the new instructions.   traZODone 50 MG tablet Commonly known as: DESYREL Take 1 tablet (50 mg total) by mouth at bedtime. What changed:   when to take this  reasons to take this   valACYclovir 500 MG tablet Commonly known as: VALTREX TAKE 1 TABLET BY MOUTH ONCE DAILY What changed:   how much to take  how to take this  when to take this  reasons to take this  additional instructions            Durable Medical Equipment  (From admission, onward)         Start     Ordered   12/11/19 1625  DME 3 n 1  Once        12/11/19 1626   12/11/19 1625  DME Walker rolling  Once       Comments: Also post left knee poly-liner  exchange from previous total knee; WBAT bil LE  Question Answer Comment  Walker: With [place]   Patient needs a walker to treat with the following condition Status post arthroscopy of right knee      12/11/19 1626          Diagnostic Studies: No results found.  Disposition: Discharge disposition: 01-Home or Self Care          Follow-up Information    Mcarthur Rossetti, MD Follow up in 2 week(s).   Specialty: Orthopedic Surgery Contact information: [address] [place] [address] [phone]                Signed: Mcarthur Rossetti 12/12/2019, 9:06 AM

## 2019-12-12 NOTE — Progress Notes (Signed)
Discharge summary packet provided to pt with instructions. Pt verbalized understanding of instructions with no complaints voiced. Pt alert/oriented in no apparent distress.D/C to home as ordered.All questions and concerns were answered.

## 2019-12-12 NOTE — Progress Notes (Signed)
Subjective: 1 Day Post-Op Procedure(s) (LRB): LEFT KNEE POLY-LINER EXCHANGE (Left) RIGHT KNEE ARTHROSCOPY WITH PARTIAL MEDIAL MENISCECTOMY (Right) Patient reports pain as mild.  Tolerated surgery well with an arthroscopy for her right knee with a partial medial meniscectomy and a polyliner exchange for the left knee.  She is awake and alert with normal vital signs.  She is in a knee immobilizer for the left knee but does not need the knee immobilizer so I did remove this.  Her calves are soft bilaterally.  Objective: Vital signs in last 24 hours: Temp:  [97 F (36.1 C)-98.3 F (36.8 C)] 98.3 F (36.8 C) (10/16 0750) Pulse Rate:  [62-90] 73 (10/16 0750) Resp:  [10-18] 16 (10/16 0750) BP: (110-158)/(55-107) 110/65 (10/16 0750) SpO2:  [97 %-100 %] 99 % (10/16 0750)  Intake/Output from previous day: 10/15 0701 - 10/16 0700 In: 2677.7 [P.O.:50; I.V.:2277.7; IV Piggyback:350] Out: 950 [Urine:900; Blood:50] Intake/Output this shift: No intake/output data recorded.  Recent Labs    12/11/19 0927  HGB 12.3   Recent Labs    12/11/19 0927  WBC 3.6*  RBC 3.70*  HCT 37.1  PLT 218   Recent Labs    12/11/19 1743  CREATININE 0.86   No results for input(s): LABPT, INR in the last 72 hours.  Sensation intact distally Intact pulses distally Dorsiflexion/Plantar flexion intact Incision: dressing C/D/I Compartment soft   Assessment/Plan: 1 Day Post-Op Procedure(s) (LRB): LEFT KNEE POLY-LINER EXCHANGE (Left) RIGHT KNEE ARTHROSCOPY WITH PARTIAL MEDIAL MENISCECTOMY (Right) Up with therapy - WBAT bil LE Discharge to home this afternoon.    Patient's anticipated LOS is less than 2 midnights, meeting these requirements: - Younger than 19 - Lives within 1 hour of care - Has a competent adult at home to recover with post-op recover - NO history of  - Chronic pain requiring opiods  - Diabetes  - Coronary Artery Disease  - Heart failure  - Heart attack  - Stroke  - DVT/VTE  -  Cardiac arrhythmia  - Respiratory Failure/COPD  - Renal failure  - Anemia  - Advanced Liver disease       Mcarthur Rossetti 12/12/2019, 9:02 AM

## 2019-12-12 NOTE — Progress Notes (Signed)
Patient stable no events Did not get any PT yesterday.  Pain is adequately controlled.  We will see how PT goes today in terms of hospital discharge. She has concern about getting her meds because she uses [facility].  Surgical dressings are intact. NVI.

## 2019-12-12 NOTE — Evaluation (Signed)
Physical Therapy Evaluation Patient Details Name: Victoria [name] MRN: [id number] DOB: Mar 21, 1960 Today's Date: 12/12/2019   History of Present Illness  The pt is a 59 yo female presenting s/p L knee poly-liner exchange and R knee arthroscopy with partial medial meniscectomy on 10/15. PMH includes: prior L TKA, L & R THA, and obesity.  Clinical Impression  Pt in bed upon arrival of PT, agreeable to evaluation at this time. Prior to admission the pt was independent with mobility, but limited and requiring increased time due to pain in bilateral knees. The pt lives at home with her spouse in a home with 4 steps to enter, but as he also had surgery last week and is now TDWB only, her daughter will be staying with her to assist as needed. The pt now presents with limitations in functional mobility, strength, power, ROM, and stability due to above dx, and will continue to benefit from skilled PT to address these deficits. The pt was able to complete OOB transfers with modA from elevated surface and cues for technique/hand positioning. We discussed increased difficulty with rising from low surfaces and how to adapt surfaces in her home to facilitate movement. The pt was able to demo good hallway ambulation and initial stair training (described below), and will be safe to return home with assist from family. The pt will benefit from HHPT to continue progressing HEP and mobility, in order to facilitate return to independent mobility without need for AD.      Follow Up Recommendations Home health PT;Supervision for mobility/OOB    Equipment Recommendations  None recommended by PT (pt has RW)    Recommendations for Other Services       Precautions / Restrictions Precautions Precautions: Fall Restrictions Weight Bearing Restrictions: Yes RLE Weight Bearing: Weight bearing as tolerated LLE Weight Bearing: Weight bearing as tolerated      Mobility  Bed Mobility Overal bed mobility:  Needs Assistance Bed Mobility: Supine to Sit     Supine to sit: Min guard     General bed mobility comments: HOB elevated, pt able to come to sit EOB with increased time  Transfers Overall transfer level: Needs assistance Equipment used: Rolling walker (2 wheeled) Transfers: Sit to/from Stand Sit to Stand: Mod assist;From elevated surface         General transfer comment: modA to power up from bed after elevation due to poor use of RLE and worse use of LLE due to pain.   Ambulation/Gait Ambulation/Gait assistance: Min guard Gait Distance (Feet): 200 Feet Assistive device: Rolling walker (2 wheeled) Gait Pattern/deviations: Step-through pattern;Antalgic;Trunk flexed Gait velocity: decreased Gait velocity interpretation: <1.31 ft/sec, indicative of household ambulator General Gait Details: slow, but steady with heavy use of RW. decreased heel toe pattern and poor active L knee flexion  Stairs Stairs: Yes Stairs assistance: Min assist Stair Management: No rails;Step to pattern;Backwards;Forwards;With walker Number of Stairs: 2 General stair comments: discussed backwards up 2 stairs with use of RW and how to instruct family to assist. backwards for navigation with RW. Also discussed forwards, step-to with RLE leading with use of rails       Balance Overall balance assessment: Mild deficits observed, not formally tested                                           Pertinent Vitals/Pain Pain Assessment: Faces Faces Pain Scale:  Hurts little more Pain Location: bilateral knees Pain Descriptors / Indicators: Sore;Grimacing Pain Intervention(s): Limited activity within patient's tolerance;Monitored during session;Repositioned;Patient requesting pain meds-RN notified    Home Living Family/patient expects to be discharged to:: Private residence Living Arrangements: Spouse/significant other Available Help at Discharge: Family;Available PRN/intermittently  (husband also had surgery last week, daughter and grandaughte) Type of Home: House Home Access: Stairs to enter Entrance Stairs-Rails: Can reach both (no rails in garage) Entrance Stairs-Number of Steps: 2 in garage, 4 with rails at front Home Layout: Two level Home Equipment: [place] - 2 wheels;Cane - quad;Shower seat;Grab bars - toilet;Grab bars - tub/shower      Prior Function Level of Independence: Needs assistance   Gait / Transfers Assistance Needed: walking using furniture, required increased time due to pain  ADL's / Homemaking Assistance Needed: able to complete, extra time        Hand Dominance   Dominant Hand: Right    Extremity/Trunk Assessment   Upper Extremity Assessment Upper Extremity Assessment: Overall WFL for tasks assessed    Lower Extremity Assessment Lower Extremity Assessment: LLE deficits/detail;RLE deficits/detail RLE Deficits / Details: limited ROM due to pain, able to use actively for transfers and stairs.  LLE Deficits / Details: very limited in flexion, 0 - 45 deg due to pain.     Cervical / Trunk Assessment Cervical / Trunk Assessment: Other exceptions Cervical / Trunk Exceptions: large body habitus  Communication   Communication: No difficulties  Cognition Arousal/Alertness: Awake/alert Behavior During Therapy: WFL for tasks assessed/performed Overall Cognitive Status: Within Functional Limits for tasks assessed                                 General Comments: pt pleasant and agreeable to all education, good awareness of safety      General Comments General comments (skin integrity, edema, etc.): ROM more limited in LLE than RLE vss. pt educated on home walking program    Exercises     Assessment/Plan    PT Assessment All further PT needs can be met in the next venue of care  PT Problem List Decreased strength;Decreased range of motion;Decreased activity tolerance;Decreased balance;Decreased knowledge of use of  DME;Obesity;Pain           PT Goals (Current goals can be found in the Care Plan section)  Acute Rehab PT Goals Patient Stated Goal: return home and reduce pain PT Goal Formulation: With patient Time For Goal Achievement: 12/26/19 Potential to Achieve Goals: Good     AM-PAC PT "6 Clicks" Mobility  Outcome Measure Help needed turning from your back to your side while in a flat bed without using bedrails?: A Little Help needed moving from lying on your back to sitting on the side of a flat bed without using bedrails?: A Little Help needed moving to and from a bed to a chair (including a wheelchair)?: A Little Help needed standing up from a chair using your arms (e.g., wheelchair or bedside chair)?: A Little Help needed to walk in hospital room?: A Little Help needed climbing 3-5 steps with a railing? : A Little 6 Click Score: 18    End of Session Equipment Utilized During Treatment: Gait belt Activity Tolerance: Patient tolerated treatment well Patient left: in bed;with call bell/phone within reach Nurse Communication: Mobility status;Patient requests pain meds (ready for d/c) PT Visit Diagnosis: Other abnormalities of gait and mobility (R26.89);Pain Pain - Right/Left: Right Pain -  part of body: Leg;Knee    Time: 1159-1230 PT Time Calculation (min) (ACUTE ONLY): 31 min   Charges:   PT Evaluation $PT Eval Low Complexity: 1 Low PT Treatments $Gait Training: 8-22 mins        Karma Ganja, PT, DPT   Acute Rehabilitation Department Pager #: [phone]  Otho Bellows 12/12/2019, 12:43 PM

## 2019-12-12 NOTE — TOC Transition Note (Signed)
Transition of Care [facility]) - CM/SW Discharge Note   Patient Details  Name: Victoria [name] MRN: [id number] Date of Birth: 09/03/1960  [facility] CM/SW Contact:  Bartholomew Crews, RN Phone Number: [phone] 12/12/2019, 11:14 AM   Clinical Narrative:     Spoke with patient at the bedside. PTA home with spouse. Discussed HH PT. Patient agreeable. Alvis Lemmings accepted referral with start of care for Monday. Discussed DME for RW and 3N1. Patient reports that she has a walker at home and declines 3N1 at this time. Spouse to provide transportation home. Patient states that she is waiting on acute PT to see her prior to discharge. Patient states that her daughter has picked up her discharge medications. No further TOC needs identified at this time.        Patient Goals and CMS Choice        Discharge Placement                       Discharge Plan and Services                                     Social Determinants of Health (SDOH) Interventions     Readmission Risk Interventions No flowsheet data found.

## 2019-12-14 NOTE — Anesthesia Postprocedure Evaluation (Signed)
Anesthesia Post Note  Patient: Victoria [name]  Procedure(s) Performed: LEFT KNEE POLY-LINER EXCHANGE (Left Knee) RIGHT KNEE ARTHROSCOPY WITH PARTIAL MEDIAL MENISCECTOMY (Right Knee)     Patient location during evaluation: PACU Anesthesia Type: General and Regional Level of consciousness: awake and alert Pain management: pain level controlled Vital Signs Assessment: post-procedure vital signs reviewed and stable Respiratory status: spontaneous breathing, nonlabored ventilation, respiratory function stable and patient connected to nasal cannula oxygen Cardiovascular status: blood pressure returned to baseline and stable Postop Assessment: no apparent nausea or vomiting Anesthetic complications: no   No complications documented.  Last Vitals:  Vitals:   12/12/19 0410 12/12/19 0750  BP: 115/75 110/65  Pulse: 75 73  Resp: 17 16  Temp: 36.5 C 36.8 C  SpO2: 99% 99%    Last Pain:  Vitals:   12/12/19 1232  TempSrc:   PainSc: 6                  Rooney Swails S

## 2019-12-15 ENCOUNTER — Encounter (HOSPITAL_COMMUNITY): Payer: Self-pay | Admitting: Orthopaedic Surgery

## 2019-12-17 ENCOUNTER — Other Ambulatory Visit: Payer: Self-pay | Admitting: Orthopaedic Surgery

## 2019-12-17 ENCOUNTER — Encounter: Payer: Self-pay | Admitting: Orthopaedic Surgery

## 2019-12-17 MED ORDER — CELECOXIB 200 MG PO CAPS: 200.0000 mg | ORAL_CAPSULE | Freq: Two times a day (BID) | ORAL | 1 refills | Status: DC | PRN

## 2019-12-17 MED ORDER — TIZANIDINE HCL 4 MG PO TABS: 4.0000 mg | ORAL_TABLET | Freq: Three times a day (TID) | ORAL | 1 refills | Status: DC | PRN

## 2019-12-17 MED FILL — tiZANidine HCL 4 MG TABS: 4 | 13 days supply | Qty: 40 | Fill #0

## 2019-12-17 MED FILL — CELECOXIB 200 MG CAP: 200 | 30 days supply | Qty: 60 | Fill #0

## 2019-12-21 ENCOUNTER — Encounter: Payer: Self-pay | Admitting: Pharmacist

## 2019-12-21 ENCOUNTER — Other Ambulatory Visit: Payer: Self-pay

## 2019-12-21 ENCOUNTER — Telehealth: Payer: Self-pay

## 2019-12-21 ENCOUNTER — Other Ambulatory Visit: Payer: Self-pay | Admitting: Obstetrics & Gynecology

## 2019-12-21 ENCOUNTER — Ambulatory Visit (HOSPITAL_BASED_OUTPATIENT_CLINIC_OR_DEPARTMENT_OTHER): Payer: No Typology Code available for payment source | Admitting: Pharmacist

## 2019-12-21 DIAGNOSIS — Z79899 Other long term (current) drug therapy: Secondary | ICD-10-CM

## 2019-12-21 MED ORDER — FLUCONAZOLE 150 MG PO TABS: ORAL_TABLET | ORAL | 0 refills | Status: DC

## 2019-12-21 MED FILL — FLUCONAZOLE 150 MG TABS: 150 | 3 days supply | Qty: 2 | Fill #0

## 2019-12-21 NOTE — Progress Notes (Signed)
   S: Patient presents for review of their specialty medication therapy.  Patient is currently taking Humira for hidradenitis suppurativa. Patient is managed by [facility]) for this.   Adherence: confirms  Efficacy: reports good control with Humira  Dosing:  Maintenance: 40 mg every week   Screening: TB test: completed per patient Hepatitis: completed per patient  Monitoring: S/sx of infection: denies CBC: see below, reports that she has history of low WBC and has been evaluated by heme and told that this was just normal for her. S/sx of hypersensitivity: denies  S/sx of malignancy: denies S/sx of heart failure: denies  O:   Lab Results  Component Value Date   WBC 3.6 (L) 12/11/2019   HGB 12.3 12/11/2019   HCT 37.1 12/11/2019   MCV 100.3 (H) 12/11/2019   PLT 218 12/11/2019      Chemistry      Component Value Date/Time   NA 142 04/09/2019 0927   K 4.7 04/09/2019 0927   CL 106 04/09/2019 0927   CO2 31 04/09/2019 0927   BUN 15 04/09/2019 0927   CREATININE 0.86 12/11/2019 1743   CREATININE 0.79 04/09/2019 0927      Component Value Date/Time   CALCIUM 9.2 04/09/2019 0927   ALKPHOS 59 10/16/2018 0903   AST 20 04/09/2019 0927   AST 26 10/16/2018 0903   ALT 21 04/09/2019 0927   ALT 26 10/16/2018 0903   BILITOT 0.5 04/09/2019 0927   BILITOT 0.5 10/16/2018 0903      A/P: 1. Medication review: Patient currently on Humira for hidradenitis suppurativa. Reviewed the medication with the patient, including the following: Humira is a TNF blocking agent indicated for ankylosing spondylitis, Crohn's disease, Hidradenitis suppurativa, psoriatic arthritis, plaque psoriasis, ulcerative colitis, and uveitis. Patient educated on purpose, proper use and potential adverse effects of Humira. The most common adverse effects are infections, headache, and injection site reactions. There is the possibility of an increased risk of malignancy but it is not well  understood if this increased risk is due to there medication or the disease state. There are rare cases of pancytopenia and aplastic anemia. No recommendations for any changes at this time.   Benard Halsted, PharmD, [place] [phone]

## 2019-12-21 NOTE — Telephone Encounter (Signed)
AEX 04/2019 H/o vaginal irritation, OV 11/05/19  Spoke with pt. Pt states having vaginal irritation and itching with discharge x 1 week. Pt states was seen in hospital on 12/11/19 for knee revision and knee meniscal repair and was given abx and foley catheter. Pt states then started having vaginal issues on 10/18. Pt denies any vaginal odor, vaginal bleeding or UTI sx.  Pt advised will review with Dr Sabra Heck and return call for OV. Pt agreeable.   Routing to Dr Sabra Heck.   Where to make OV for pt, no available appts until 10/29, please advise.

## 2019-12-21 NOTE — Telephone Encounter (Signed)
Patient has vaginal irritation and itching where she has started to bleed.

## 2019-12-21 NOTE — Telephone Encounter (Signed)
Spoke with pt. Pt given update per Dr Sabra Heck. Pt agreeable to take Diflucan Rx and return call for OV if sx don't resolve. Pt verbalized understanding and is thankful for Rx.  Encounter closed  Rx Diflucan sent to pharmacy on file #2, [id number].

## 2019-12-21 NOTE — Telephone Encounter (Signed)
I'm ok treating her with Diflucan 150mg  po x 1, repeat 72 hours if needed.  If doesn't resolve, then can have appt.  #2/0RF.

## 2019-12-24 ENCOUNTER — Other Ambulatory Visit: Payer: Self-pay | Admitting: Pharmacist

## 2019-12-24 ENCOUNTER — Encounter: Payer: Self-pay | Admitting: Orthopaedic Surgery

## 2019-12-24 ENCOUNTER — Ambulatory Visit: Payer: Self-pay

## 2019-12-24 ENCOUNTER — Ambulatory Visit (INDEPENDENT_AMBULATORY_CARE_PROVIDER_SITE_OTHER): Payer: No Typology Code available for payment source

## 2019-12-24 ENCOUNTER — Ambulatory Visit (INDEPENDENT_AMBULATORY_CARE_PROVIDER_SITE_OTHER): Payer: No Typology Code available for payment source | Admitting: Orthopaedic Surgery

## 2019-12-24 ENCOUNTER — Other Ambulatory Visit: Payer: Self-pay

## 2019-12-24 DIAGNOSIS — G8929 Other chronic pain: Secondary | ICD-10-CM

## 2019-12-24 DIAGNOSIS — M25562 Pain in left knee: Secondary | ICD-10-CM

## 2019-12-24 DIAGNOSIS — T84063D Wear of articular bearing surface of internal prosthetic left knee joint, subsequent encounter: Secondary | ICD-10-CM

## 2019-12-24 DIAGNOSIS — M25561 Pain in right knee: Secondary | ICD-10-CM

## 2019-12-24 DIAGNOSIS — Z9889 Other specified postprocedural states: Secondary | ICD-10-CM

## 2019-12-24 MED ORDER — HYDROMORPHONE HCL 4 MG PO TABS: 4.0000 mg | ORAL_TABLET | Freq: Four times a day (QID) | ORAL | 0 refills | Status: DC | PRN

## 2019-12-24 MED ORDER — HUMIRA PEN 40 MG/0.8ML ~~LOC~~ PNKT: PEN_INJECTOR | SUBCUTANEOUS | 5 refills | Status: DC

## 2019-12-24 MED FILL — HYDROmorphone HCL 4 MG TABS: 4 | 7 days supply | Qty: 30 | Fill #0

## 2019-12-24 NOTE — Progress Notes (Signed)
The patient is 2 weeks status post surgery on both knees.  We performed a right knee arthroscopy with a partial medial meniscectomy on the right knee.  We performed a polyethylene liner exchange with upsizing the polyethylene liner one size on the left knee.  She reports significant right knee pain.  I was able shoulder arthroscopy pictures.  She does have a some cartilage thinning on the medial weightbearing surface of the knee but no full-thickness cartilage deficit or defects at all.  There was a significant medial meniscal tear.  The lateral side was intact.  She points to the IT band the source of her pain although right knee.  The left knee we were able to upsize her poly-.  The sutures and incisions look good on both knees.  We are able to remove sutures in place Steri-Strips.  It is essential that we get her to physical therapy to strengthen both knees.  She would like to try some Dilaudid for pain control I told her to use this sparingly and I did send some in for her.  As far as physical therapy goes, this can be done in the [facility] system and any modalities that therapist can do to help decrease her pain with the appropriate.  She will likely eventually a candidate for hyaluronic acid for the right knee.  We will see her back in 4 weeks to see how she is doing overall.  All question concerns were answered and addressed.

## 2019-12-25 ENCOUNTER — Other Ambulatory Visit: Payer: Self-pay

## 2019-12-25 DIAGNOSIS — Z9889 Other specified postprocedural states: Secondary | ICD-10-CM

## 2019-12-25 DIAGNOSIS — M25562 Pain in left knee: Secondary | ICD-10-CM

## 2019-12-25 NOTE — Progress Notes (Signed)
   Subjective:    Patient ID: Gevena [name], female    DOB: [birth year], 59 y.o.   MRN: [id number]  HPI Patient is scheduled for acute right medial meniscal tear on October 15.  She has severe unremitting pain.  This affects her sleep and ability to work.  She is also scheduled to have a left knee polyliner exchange on the same day.  Surgery will be done by Dr. Ninfa Linden.  Patient is finding it difficult to be at work on her feet all day long.  This is upsetting to her and has gotten her down and depressed.  She would like to be out of work a few days.  She had left knee total arthroplasty in 2019.  She continues on Humira per [facility] dermatology for hidradenitis suppurativa.  Takes 40 mg every week.  This is worked well for her.  She takes Xanax 1 mg at night for sleep.  Has history of migraine headaches managed by Dr. Jaynee Eagles.  History of B12 deficiency and gives herself B12 injections monthly.  GYN physician has prescribed trazodone 50 mg at night to help with sleep as well.  She has history of hypothyroidism and is on levothyroxine 0.112 mg daily.  She is status post gastric bypass surgery.  Prior to her surgery she weighed 296 pounds.  She had gastric bypass surgery Roux-en-Y laparoscopic by Dr. Hassell Done in 2016.  She had reactive hypoglycemia thereafter and was seen at [facility].  She was able to get help to work through the reactive hypoglycemia and does not have this now.  She has a history of iron deficiency requiring iron infusions.  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  In May 2019 she had an acute medial meniscal tear and underwent left knee arthroscopic surgery with meniscal debridement.   Review of Systems see above     Objective:   Physical Exam  Blood pressure 110/80 pulse 78 regular pulse oximetry 96% weight 271 pounds BMI 43.74  Discussion regarding orthopedic issues.   These Ortho problems are affecting her ability to do her work on a daily basis.  She is not able to work sitting down at this point time and will be our GI.  She has a considerable amount of walking to do and it is very painful and tiresome for her.  This has her depressed. Assessment & Plan:  Situational stress with orthopedic issues  Chronic knee pain bilateral  Plan: Letter written by me personally for patient to be out of work October 5 and 6 due to severe knee pain.

## 2019-12-25 NOTE — Patient Instructions (Signed)
Letter written for patient to be out of work October 5 and October 6 due to severe knee pain.  She has follow-up visit here in November.

## 2019-12-28 ENCOUNTER — Encounter: Payer: Self-pay | Admitting: Physical Therapy

## 2019-12-28 ENCOUNTER — Ambulatory Visit (INDEPENDENT_AMBULATORY_CARE_PROVIDER_SITE_OTHER): Payer: No Typology Code available for payment source | Admitting: Physical Therapy

## 2019-12-28 ENCOUNTER — Other Ambulatory Visit: Payer: Self-pay

## 2019-12-28 DIAGNOSIS — R600 Localized edema: Secondary | ICD-10-CM

## 2019-12-28 DIAGNOSIS — M6281 Muscle weakness (generalized): Secondary | ICD-10-CM

## 2019-12-28 DIAGNOSIS — M25561 Pain in right knee: Secondary | ICD-10-CM

## 2019-12-28 DIAGNOSIS — M25562 Pain in left knee: Secondary | ICD-10-CM | POA: Diagnosis not present

## 2019-12-28 DIAGNOSIS — R262 Difficulty in walking, not elsewhere classified: Secondary | ICD-10-CM

## 2019-12-28 MED FILL — HUMIRA PEN 40 MG/0.8ML PNKT: 40 | 28 days supply | Qty: 4 | Fill #0

## 2019-12-28 NOTE — Patient Instructions (Signed)
Access Code: [email or web address] URL: [email or web address] Date: 12/28/2019 Prepared by: Kearney Hard  Exercises Seated Heel Slide - 3-4 x daily - 7 x weekly - 2 sets - 10 reps Supine Active Straight Leg Raise - 3-4 x daily - 7 x weekly - 2 sets - 10 reps Bent Knee Fallouts - 3-4 x daily - 7 x weekly - 2 sets - 10 reps Seated Hip Adduction Isometrics with Ball - 3-4 x daily - 7 x weekly - 2 sets - 10 reps - 5 seconds hold Sit to Stand with Counter Support - 3-4 x daily - 7 x weekly - 10 reps

## 2019-12-28 NOTE — Therapy (Signed)
[facility] [address] [place], [place], [address] Phone: [phone]   Fax:  [phone]  Physical Therapy Evaluation  Patient Details  Name: Victoria [name] MRN: [id number] Date of Birth: 1961-02-09 Referring Provider (PT): Jean Rosenthal, MD   Encounter Date: 12/28/2019   PT End of Session - 12/28/19 1038    Visit Number 1    Number of Visits 16    Date for PT Re-Evaluation 04/01/20    PT Start Time 0845    PT Stop Time 0928    PT Time Calculation (min) 43 min    Activity Tolerance Patient tolerated treatment well    Behavior During Therapy [facility] for tasks assessed/performed           Past Medical History:  Diagnosis Date  . Bursitis    right shoulder  . Chronic leukopenia    intermittant since 2010, followed by Dr. Renold Genta now  . Degenerative joint disease of low back 09/02/15    Dr. Maureen Ralphs  . Fibrocystic breast   . GERD (gastroesophageal reflux disease)   . Headache    per pt more stress/tension  . Heart palpitations    SEE EPIC ENCOUTNER , CARDIOLOGY DR. Tressia Miners TURNER 2018; reports on 05-16-17 " i haven't had any bouts of those lately"    . Hemorrhoids   . History of Clostridium difficile infection 12/2010  . History of exercise intolerance    ETT on 04-27-2016-- negative ([facility] treadmill score 7)  . History of Helicobacter pylori infection 2001 and 09/ 2012  . HSV-2 infection    genital  . Hx of adenomatous colonic polyps 10/17/2007  . Hydradenitis    per pt currently treated with Humira injection  . Hypothyroidism, postsurgical 1980  . Medial meniscus tear    right knee  . Osteoarthritis   . Pernicious anemia    b12 def  . PONV (postoperative nausea and vomiting)   . 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 Tendonitis    right shoulder  . Varicose veins   . Vitamin B 12 deficiency   . Vitamin D deficiency      Past Surgical History:  Procedure Laterality Date  . BREATH TEK H PYLORI N/A 05/03/2014   Procedure: BREATH TEK H PYLORI;  Surgeon: Kaylyn Lim, MD;  Location: [facility];  Service: General;  Laterality: N/A;  . CARDIOVASCULAR STRESS TEST  03/11/2013   dr Tressia Miners turner   Low risk nuclear study w/ a very small anterior perfusion defect that most likely represents breast attenuation artifact, less likely this could represent a true perfusion abnormality in the distribution of diagonal branch of the LAD/  normal LV function and wall motion , ef 66%  . CATARACT EXTRACTION W/ INTRAOCULAR LENS  IMPLANT, BILATERAL  10/2017  . CESAREAN SECTION  1985  . CHOLECYSTECTOMY OPEN  1987  . COLONOSCOPY  02/2017   Dr [name] ; per patient they found 7 polyps with polypectomy; on 5 year track   . FINE NEEDLE ASPIRATION Left 05/22/2017   Procedure: LEFT KNEE ASPIRATION;  Surgeon: Gaynelle Arabian, MD;  Location: [facility];  Service: Orthopedics;  Laterality: Left;  Marland Kitchen GASTRIC ROUX-EN-Y N/A 07/06/2014   Procedure: LAPAROSCOPIC ROUX-EN-Y GASTRIC BYPASS, ENTEROLYSIS OF ADHESIONS WITH UPPER ENDOSCOPY;  Surgeon: Johnathan Hausen, MD;  Location: [facility];  Service: General;  Laterality: N/A;  . HAMMER TOE SURGERY Bilateral 02/17/2008   bilateral foot digit's 2  and 5  . HIATAL HERNIA REPAIR N/A 07/06/2014   Procedure: LAPAROSCOPIC REPAIR OF HIATAL HERNIA;  Surgeon: Johnathan Hausen, MD;  Location: [facility];  Service: General;  Laterality: N/A;  . I & D KNEE WITH POLY EXCHANGE Left 12/11/2019   Procedure: LEFT KNEE POLY-LINER EXCHANGE;  Surgeon: Mcarthur Rossetti, MD;  Location: [place];  Service: Orthopedics;  Laterality: Left;  . KNEE ARTHROSCOPY Right 12/11/2019   Procedure: RIGHT KNEE ARTHROSCOPY WITH PARTIAL MEDIAL MENISCECTOMY;  Surgeon: Mcarthur Rossetti, MD;  Location: [place];  Service: Orthopedics;  Laterality: Right;  . KNEE ARTHROSCOPY WITH MEDIAL MENISECTOMY Left 07/17/2017   Procedure: LEFT KNEE ARTHROSCOPY WITH  MEDIAL LATERAL MENISECTOMY;  Surgeon: Gaynelle Arabian, MD;  [facility];  Service: Orthopedics;  Laterality: Left;  Marland Kitchen MASS EXCISION N/A 10/11/2016   Procedure: EXCISION OF PERIANAL MASS;  Surgeon: [name], MD;  Location: [place];  Service: General;  Laterality: N/A;  . REFRACTIVE SURGERY    . THYROIDECTOMY  1980  . TOTAL ABDOMINAL HYSTERECTOMY  08-25-2002   dr Margaretha Glassing   ovaries retained  . TOTAL HIP ARTHROPLASTY Right 05/21/2012   Procedure: TOTAL HIP ARTHROPLASTY;  Surgeon: Gearlean Alf, MD;  [facility];  Service: Orthopedics;  Laterality: Right;  . TOTAL HIP ARTHROPLASTY Left 01-31-2009  dr Wynelle Link  . TOTAL HIP REVISION Left 05/22/2017   Procedure: Left hip bearing surface and head revision;  Surgeon: Gaynelle Arabian, MD;  [facility];  Service: Orthopedics;  Laterality: Left;  . TOTAL KNEE ARTHROPLASTY Left 01/20/2018   Procedure: LEFT TOTAL KNEE ARTHROPLASTY;  Surgeon: Gaynelle Arabian, MD;  [facility];  Service: Orthopedics;  Laterality: Left;  19min  . TRANSTHORACIC ECHOCARDIOGRAM  05/03/2016   ef 55-60%/  trivial MR/  mild TR  . TUBAL LIGATION      There were no vitals filed for this visit.    Subjective Assessment - 12/28/19 0854    Subjective Pt arriving to PT for evaluation s/p R knee arthroscopy and partial medial meniscectomy on 12/11/2019 Pt also underwent L TKA revision on 12/11/2019. Pt's initial Left TKA was 12/2017. Pt reporting history of bialteral knee pain following her previous surgeries. Pt amb into clinic with rolling walker.    Pertinent History Bilateral THA, L TKA, tendonitis, R shoulder bursitis, DJD back 2017, GERD, heart palpitations, OA, PONV, vit D deficiency, bit B12 deficiency    Diagnostic tests MRI    Patient Stated Goals walk without walker, I want to get back to work    Currently in Pain? Yes    Pain Score 5     Pain Location Knee    Pain Orientation Right    Pain Descriptors / Indicators Aching     Pain Type Surgical pain;Acute pain    Pain Onset 1 to 4 weeks ago    Pain Frequency Intermittent    Aggravating Factors  walking, standing, being up on it    Pain Relieving Factors resting, change in positions    Effect of Pain on Daily Activities difficulty with ADL's, trouble standing while trying to cook              [facility] PT Assessment - 12/28/19 0001      Assessment   Medical Diagnosis R knee arthroscopy partial medial meniscectomy, L TKA revision     Referring Provider (PT) Jean Rosenthal, MD    Onset Date/Surgical Date 12/11/19    Hand Dominance Right    Prior Therapy yes,  HHPT a few visits      Precautions   Precautions None      Restrictions   Weight Bearing Restrictions No      Balance Screen   Has the patient fallen in the past 6 months No    Is the patient reluctant to leave their home because of a fear of falling?  No      Home Ecologist residence    Home Access Stairs to enter    Entrance Stairs-Number of Steps [place] - 2 wheels      Prior Function   Level of Independence Independent    Vocation Full time employment    Vocation Requirements Works at [employer] on her feet all day long      Cognition   Overall Cognitive Status Within Functional Limits for tasks assessed      Observation/Other Assessments   Focus on Therapeutic Outcomes (FOTO)  67% limitation      Observation/Other Assessments-Edema    Edema Circumferential      Posture/Postural Control   Posture/Postural Control Postural limitations    Postural Limitations Rounded Shoulders;Forward head;Increased lumbar lordosis      ROM / Strength   AROM / PROM / Strength AROM;PROM;Strength      AROM   AROM Assessment Site Knee    Right/Left Knee Right;Left    Right Knee Extension 0    Right Knee Flexion 95    Left Knee Extension 0    Left Knee Flexion 65      PROM   PROM Assessment  Site Knee    Right/Left Knee Right;Left    Right Knee Extension 0    Right Knee Flexion 105    Left Knee Extension 0    Left Knee Flexion 72      Strength   Overall Strength Deficits    Strength Assessment Site Hip;Knee    Right/Left Hip Right;Left    Right Hip Flexion 4/5    Right Hip ABduction 4-/5    Right Hip ADduction 4-/5    Left Hip Flexion 4-/5    Left Hip ABduction 4-/5    Left Hip ADduction 4-/5    Right/Left Knee Right;Left    Right Knee Flexion 4/5    Right Knee Extension 4/5    Left Knee Flexion 4-/5    Left Knee Extension 4-/5      Palpation   Patella mobility L: decreased medial/lateral movement due to swelling    Palpation comment TTP: R lateral joint line, L: anterior medial joint line      Transfers   Five time sit to stand comments  36.2 seconds with UE support and walker       Ambulation/Gait   Gait Comments amb with RW with step through gait pattern with decreased heel strike bilaterally, decreased hip and knee fleixon on the left and decreased step length bilaterally.                       Objective measurements completed on examination: See above findings.                    PT Long Term Goals - 12/28/19 0902      PT LONG TERM GOAL #1   Title Pt will be indpedent in her HEP and porgression.    Time 8    Period  Weeks    Status New    Target Date 02/26/20      PT LONG TERM GOAL #2   Title Pt will be able to amb without an assistive device on communtiy surfaces.    Time 8    Period Weeks    Status New    Target Date 02/26/20      PT LONG TERM GOAL #3   Title Pt will improve her bilateral LE strength to >/= +4/5 in order to improve fucntional mobility.    Time 8    Period Weeks    Status New    Target Date 02/26/20      PT LONG TERM GOAL #4   Title Pt will be able to perform sit to stand in </= 20 seconds.    Baseline 36.2 seconds using UE support and walker    Time 8    Period Weeks    Status New       PT LONG TERM GOAL #5   Title Pt will be able to navigate 5 steps with rail with step over step pattern.    Time 8    Period Weeks    Status New      Additional Long Term Goals   Additional Long Term Goals Yes      PT LONG TERM GOAL #6   Title Pt will improve her FOTO score from 67% limitation to </= 52% limitation.    Time 8    Period Weeks    Status New                  Plan - 12/28/19 1031    Clinical Impression Statement Pt arriving with complex history of bilateral hip replacements, L knee replacement, R shoulder bursitis/tendonitis who under went R knee arthroscopy and partial medial meniscectomy and L TKA revision on 12/11/2019. Pt presenting amb with RW with antalgic gait pattern. Pt with bilateral LE weakness and decreased bilateral knee flexion. Pt with swelling noted bilaterally with pain reported of 6/10 with flexion. Skilled PT needed to address pt's impairments with the below interventions.    Personal Factors and Comorbidities Comorbidity 3+    Comorbidities bilateral hip replacement, L TKA 2019, R shoulder bursitis, tendonitis, DJD back 2017, heart palpations, OA, vit B12-def, vit D def    Examination-Activity Limitations Transfers;Sit;Squat;Stairs;Other;Stand    Examination-Participation Restrictions Community Activity;Other;Laundry;Occupation    Stability/Clinical Decision Making Evolving/Moderate complexity    Rehab Potential Good    PT Frequency 2x / week    PT Duration 8 weeks    PT Treatment/Interventions ADLs/Self Care Home Management;Cryotherapy;Electrical Stimulation;Gait training;Stair training;Functional mobility training;Therapeutic activities;Therapeutic exercise;Balance training;Patient/family education;Manual techniques;Taping;Passive range of motion;DME Instruction;Moist Heat    PT Next Visit Plan Nustep, LE strengthening, stretching, sit to stand, gait training    PT Home Exercise Plan [email or web address]    Consulted and Agree with Plan of Care Patient            Patient will benefit from skilled therapeutic intervention in order to improve the following deficits and impairments:  Postural dysfunction, Decreased strength, Decreased mobility, Increased edema, Decreased balance, Impaired flexibility, Pain, Decreased activity tolerance, Obesity, Difficulty walking  Visit Diagnosis: Acute pain of right knee  Acute pain of left knee  Difficulty in walking, not elsewhere classified  Muscle weakness (generalized)  Localized edema     Problem List Patient Active Problem List   Diagnosis Date Noted  . Status post arthroscopy of right knee 12/11/2019  . 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 Acute medial meniscal tear, right, subsequent encounter 08/27/2019  . History of total left knee replacement 07/16/2019  . Peroneal neuropathy at knee, left 06/25/2019  . Lumbosacral radiculopathy 06/07/2019  . 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 IDA (iron deficiency anemia) 10/16/2018  . OA (osteoarthritis) of knee 01/20/2018  . Acute meniscal tear, medial 07/17/2017  . Failed total hip arthroplasty ([place]) 05/22/2017  . 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 Insomnia 11/08/2016  . Pernicious anemia 11/08/2016  . Leukopenia 07/09/2016  . Hypocupremia 07/09/2016  . Palpitations 04/12/2016  . 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 Lap gastric bypass May 2016 07/06/2014  . History of Clostridium difficile infection 08/12/2011  . Postoperative hypothyroidism 08/26/2010  . Gastroesophageal reflux disease 08/26/2010  . Vitamin D deficiency 08/26/2010  . Fibrocystic disease of breast 08/26/2010  . Cobalamin deficiency 08/26/2010  . Obesity 03/20/2010  . Backache 03/20/2010    Oretha Caprice, PT, MPT 12/28/2019, 10:39 AM  [facility] [address] [place], [place], [address] Phone: [phone]   Fax:  [phone]  Name:  Victoria [name] MRN: [id number] Date of Birth: Apr 03, 1960

## 2019-12-29 ENCOUNTER — Other Ambulatory Visit: Payer: Self-pay | Admitting: Internal Medicine

## 2019-12-29 MED FILL — ALPRAZOLAM 1 MG TABS: 1 | 90 days supply | Qty: 90 | Fill #0

## 2019-12-29 MED FILL — GABAPENTIN 600 MG TABLET: 600 | 30 days supply | Qty: 90 | Fill #0

## 2019-12-30 MED FILL — NURTEC 75 MG TBDP: 75 | 15 days supply | Qty: 8 | Fill #3

## 2019-12-31 ENCOUNTER — Other Ambulatory Visit: Payer: Self-pay

## 2019-12-31 ENCOUNTER — Ambulatory Visit (INDEPENDENT_AMBULATORY_CARE_PROVIDER_SITE_OTHER): Payer: No Typology Code available for payment source | Admitting: Physical Therapy

## 2019-12-31 ENCOUNTER — Encounter: Payer: Self-pay | Admitting: Physical Therapy

## 2019-12-31 DIAGNOSIS — R262 Difficulty in walking, not elsewhere classified: Secondary | ICD-10-CM

## 2019-12-31 DIAGNOSIS — M6281 Muscle weakness (generalized): Secondary | ICD-10-CM | POA: Diagnosis not present

## 2019-12-31 DIAGNOSIS — R600 Localized edema: Secondary | ICD-10-CM

## 2019-12-31 DIAGNOSIS — M25561 Pain in right knee: Secondary | ICD-10-CM | POA: Diagnosis not present

## 2019-12-31 DIAGNOSIS — M25562 Pain in left knee: Secondary | ICD-10-CM | POA: Diagnosis not present

## 2019-12-31 NOTE — Therapy (Signed)
[facility] [address] [place], [place], [address] Phone: [phone]   Fax:  [phone]  Physical Therapy Treatment  Patient Details  Name: Victoria [name] MRN: [id number] Date of Birth: 02-Mar-1960 Referring Provider (PT): Jean Rosenthal, MD   Encounter Date: 12/31/2019   PT End of Session - 12/31/19 0844    Visit Number 2    Number of Visits 16    Date for PT Re-Evaluation 04/01/20    PT Start Time 0845    PT Stop Time 0932    PT Time Calculation (min) 47 min    Activity Tolerance Patient tolerated treatment well    Behavior During Therapy [facility] for tasks assessed/performed           Past Medical History:  Diagnosis Date  . Bursitis    right shoulder  . Chronic leukopenia    intermittant since 2010, followed by Dr. Renold Genta now  . Degenerative joint disease of low back 09/02/15    Dr. Maureen Ralphs  . Fibrocystic breast   . GERD (gastroesophageal reflux disease)   . Headache    per pt more stress/tension  . Heart palpitations    SEE EPIC ENCOUTNER , CARDIOLOGY DR. Tressia Miners TURNER 2018; reports on 05-16-17 " i haven't had any bouts of those lately"    . Hemorrhoids   . History of Clostridium difficile infection 12/2010  . History of exercise intolerance    ETT on 04-27-2016-- negative ([facility] treadmill score 7)  . History of Helicobacter pylori infection 2001 and 09/ 2012  . HSV-2 infection    genital  . Hx of adenomatous colonic polyps 10/17/2007  . Hydradenitis    per pt currently treated with Humira injection  . Hypothyroidism, postsurgical 1980  . Medial meniscus tear    right knee  . Osteoarthritis   . Pernicious anemia    b12 def  . PONV (postoperative nausea and vomiting)   . 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 Tendonitis    right shoulder  . Varicose veins   . Vitamin B 12 deficiency   . Vitamin D deficiency     Past  Surgical History:  Procedure Laterality Date  . BREATH TEK H PYLORI N/A 05/03/2014   Procedure: BREATH TEK H PYLORI;  Surgeon: Kaylyn Lim, MD;  Location: [facility];  Service: General;  Laterality: N/A;  . CARDIOVASCULAR STRESS TEST  03/11/2013   dr Tressia Miners turner   Low risk nuclear study w/ a very small anterior perfusion defect that most likely represents breast attenuation artifact, less likely this could represent a true perfusion abnormality in the distribution of diagonal branch of the LAD/  normal LV function and wall motion , ef 66%  . CATARACT EXTRACTION W/ INTRAOCULAR LENS  IMPLANT, BILATERAL  10/2017  . CESAREAN SECTION  1985  . CHOLECYSTECTOMY OPEN  1987  . COLONOSCOPY  02/2017   Dr [name] ; per patient they found 7 polyps with polypectomy; on 5 year track   . FINE NEEDLE ASPIRATION Left 05/22/2017   Procedure: LEFT KNEE ASPIRATION;  Surgeon: Gaynelle Arabian, MD;  Location: [facility];  Service: Orthopedics;  Laterality: Left;  Marland Kitchen GASTRIC ROUX-EN-Y N/A 07/06/2014   Procedure: LAPAROSCOPIC ROUX-EN-Y GASTRIC BYPASS, ENTEROLYSIS OF ADHESIONS WITH UPPER ENDOSCOPY;  Surgeon: Johnathan Hausen, MD;  Location: [facility];  Service: General;  Laterality: N/A;  . HAMMER TOE SURGERY Bilateral 02/17/2008   bilateral foot digit's 2  and 5  . HIATAL HERNIA REPAIR N/A 07/06/2014   Procedure: LAPAROSCOPIC REPAIR OF HIATAL HERNIA;  Surgeon: Johnathan Hausen, MD;  Location: [facility];  Service: General;  Laterality: N/A;  . I & D KNEE WITH POLY EXCHANGE Left 12/11/2019   Procedure: LEFT KNEE POLY-LINER EXCHANGE;  Surgeon: Mcarthur Rossetti, MD;  Location: [place];  Service: Orthopedics;  Laterality: Left;  . KNEE ARTHROSCOPY Right 12/11/2019   Procedure: RIGHT KNEE ARTHROSCOPY WITH PARTIAL MEDIAL MENISCECTOMY;  Surgeon: Mcarthur Rossetti, MD;  Location: [place];  Service: Orthopedics;  Laterality: Right;  . KNEE ARTHROSCOPY WITH MEDIAL MENISECTOMY Left 07/17/2017   Procedure: LEFT KNEE ARTHROSCOPY WITH MEDIAL  LATERAL MENISECTOMY;  Surgeon: Gaynelle Arabian, MD;  Location[facility];  Service: Orthopedics;  Laterality: Left;  Marland Kitchen MASS EXCISION N/A 10/11/2016   Procedure: EXCISION OF PERIANAL MASS;  Surgeon: [name], MD;  Location: [place];  Service: General;  Laterality: N/A;  . REFRACTIVE SURGERY    . THYROIDECTOMY  1980  . TOTAL ABDOMINAL HYSTERECTOMY  08-25-2002   dr Margaretha Glassing   ovaries retained  . TOTAL HIP ARTHROPLASTY Right 05/21/2012   Procedure: TOTAL HIP ARTHROPLASTY;  Surgeon: Gearlean Alf, MD;  Location: [facility];  Service: Orthopedics;  Laterality: Right;  . TOTAL HIP ARTHROPLASTY Left 01-31-2009  dr Wynelle Link  . TOTAL HIP REVISION Left 05/22/2017   Procedure: Left hip bearing surface and head revision;  Surgeon: Gaynelle Arabian, MD;  Location: [facility];  Service: Orthopedics;  Laterality: Left;  . TOTAL KNEE ARTHROPLASTY Left 01/20/2018   Procedure: LEFT TOTAL KNEE ARTHROPLASTY;  Surgeon: Gaynelle Arabian, MD;  Location: [facility];  Service: Orthopedics;  Laterality: Left;  62min  . TRANSTHORACIC ECHOCARDIOGRAM  05/03/2016   ef 55-60%/  trivial MR/  mild TR  . TUBAL LIGATION      There were no vitals filed for this visit.   Subjective Assessment - 12/31/19 0844    Subjective She has been trying exercises that PT issued at eval.    Pertinent History Bilateral THA, L TKA, tendonitis, R shoulder bursitis, DJD back 2017, GERD, heart palpitations, OA, PONV, vit D deficiency, bit B12 deficiency    Diagnostic tests MRI    Patient Stated Goals walk without walker, I want to get back to work    Currently in Pain? Yes    Pain Score 7    right knee 7/10, left knee 5/10   Pain Location Knee    Pain Orientation Right;Left    Pain Descriptors / Indicators Pins and needles    Pain Type Chronic pain    Pain Onset 1 to 4 weeks ago    Pain Frequency Constant    Aggravating Factors  standing, in mornings stiffer & achy    Pain Relieving Factors tylenol, meds,                              [facility] Adult PT Treatment/Exercise - 12/31/19 0845      Knee/Hip Exercises: Aerobic   Nustep Seat 13, Level 5 with BUEs & BLEs 8 min           Access Code: [email or web address] URL: [email or web address] Date: 12/31/2019 Prepared by: Jamey Reas  Exercises Reclined in chair   Active Straight Leg Raise - 3-4 x daily - 7 x weekly - 2 sets - 10 reps - 5 seconds hold Reclined in chair Bent Knee Fallouts with red theraband - 1 x  daily - 7 x weekly - 2 sets - 10 reps - 5 seconds hold Seated Knee Flexion Extension AROM - 3-4 x daily - 7 x weekly - 2 sets - 10 reps - 5 seconds hold Seated Isometric Hip Adduction with Ball - 3-4 x daily - 7 x weekly - 2 sets - 10 reps - 5 seconds hold Sit to Stand with Counter Support - 3-4 x daily - 7 x weekly - 2 sets - 10 reps - 5 seconds hold Seated Hamstring Stretch - 2-3 x daily - 7 x weekly - 1 sets - 3 reps - 30 seconds hold Seated Gastroc Stretch with Strap - 2-3 x daily - 7 x weekly - 1 sets - 3 reps - 30 seconds hold Seated Hamstring Stretch with Strap - 2-3 x daily - 7 x weekly - 1 sets - 3 reps - 30 seconds hold         PT Education - 12/31/19 0918    Education Details Reviewed & updated HEP Medbridge Access Code: [facility]    PT instructed in need for ongoing exercise for flexibility, strength, muscle endurance & balance    Person(s) Educated Patient    Methods Explanation;Demonstration;Tactile cues;Verbal cues;Handout    Comprehension Verbalized understanding;Returned demonstration;Verbal cues required;Tactile cues required               PT Long Term Goals - 12/28/19 0902      PT LONG TERM GOAL #1   Title Pt will be indpedent in her HEP and porgression.    Time 8    Period Weeks    Status New    Target Date 02/26/20      PT LONG TERM GOAL #2   Title Pt will be able to amb without an assistive device on communtiy surfaces.    Time 8    Period Weeks    Status New    Target Date  02/26/20      PT LONG TERM GOAL #3   Title Pt will improve her bilateral LE strength to >/= +4/5 in order to improve fucntional mobility.    Time 8    Period Weeks    Status New    Target Date 02/26/20      PT LONG TERM GOAL #4   Title Pt will be able to perform sit to stand in </= 20 seconds.    Baseline 36.2 seconds using UE support and walker    Time 8    Period Weeks    Status New      PT LONG TERM GOAL #5   Title Pt will be able to navigate 5 steps with rail with step over step pattern.    Time 8    Period Weeks    Status New      Additional Long Term Goals   Additional Long Term Goals Yes      PT LONG TERM GOAL #6   Title Pt will improve her FOTO score from 67% limitation to </= 52% limitation.    Time 8    Period Weeks    Status New                 Plan - 12/31/19 0849    Clinical Impression Statement PT educated pt on benefits and need for routine ongoing exercise with arthritis. She verbalized understanding. PT reviewed HEP and added stretches. Pt appears to understand exercises.    Personal Factors and Comorbidities Comorbidity 3+    Comorbidities  bilateral hip replacement, L TKA 2019, R shoulder bursitis, tendonitis, DJD back 2017, heart palpations, OA, vit B12-def, vit D def    Examination-Activity Limitations Transfers;Sit;Squat;Stairs;Other;Stand    Examination-Participation Restrictions Community Activity;Other;Laundry;Occupation    Stability/Clinical Decision Making Evolving/Moderate complexity    Rehab Potential Good    PT Frequency 2x / week    PT Duration 8 weeks    PT Treatment/Interventions ADLs/Self Care Home Management;Cryotherapy;Electrical Stimulation;Gait training;Stair training;Functional mobility training;Therapeutic activities;Therapeutic exercise;Balance training;Patient/family education;Manual techniques;Taping;Passive range of motion;DME Instruction;Moist Heat    PT Next Visit Plan Check ITB due to lateral knee pain,     Nustep, LE  strengthening, stretching, sit to stand, gait training    PT Home Exercise Plan Access Code: [email or web address]    Consulted and Agree with Plan of Care Patient           Patient will benefit from skilled therapeutic intervention in order to improve the following deficits and impairments:  Postural dysfunction, Decreased strength, Decreased mobility, Increased edema, Decreased balance, Impaired flexibility, Pain, Decreased activity tolerance, Obesity, Difficulty walking  Visit Diagnosis: Acute pain of right knee  Acute pain of left knee  Difficulty in walking, not elsewhere classified  Muscle weakness (generalized)  Localized edema     Problem List Patient Active Problem List   Diagnosis Date Noted  . Status post arthroscopy of right knee 12/11/2019  . 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 Acute medial meniscal tear, right, subsequent encounter 08/27/2019  . History of total left knee replacement 07/16/2019  . Peroneal neuropathy at knee, left 06/25/2019  . Lumbosacral radiculopathy 06/07/2019  . 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 IDA (iron deficiency anemia) 10/16/2018  . OA (osteoarthritis) of knee 01/20/2018  . Acute meniscal tear, medial 07/17/2017  . 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 Insomnia 11/08/2016  . Pernicious anemia 11/08/2016  . Leukopenia 07/09/2016  . Hypocupremia 07/09/2016  . Palpitations 04/12/2016  . 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 Lap gastric bypass May 2016 07/06/2014  . History of Clostridium difficile infection 08/12/2011  . Postoperative hypothyroidism 08/26/2010  . Gastroesophageal reflux disease 08/26/2010  . Vitamin D deficiency 08/26/2010  . Fibrocystic disease of breast 08/26/2010  . Cobalamin deficiency 08/26/2010  . Obesity 03/20/2010  . Backache 03/20/2010    Jamey Reas, PT, DPT 12/31/2019,  9:40 AM  [facility] [address] [place], [place], [address] Phone: [phone]   Fax:  [phone]  Name: Victoria [name] MRN: [id number] Date of Birth: 1960/09/13

## 2019-12-31 NOTE — Patient Instructions (Signed)
Access Code: [email or web address] URL: [email or web address] Date: 12/31/2019 Prepared by: Jamey Reas  Exercises Supine Active Straight Leg Raise - 3-4 x daily - 7 x weekly - 2 sets - 10 reps - 5 seconds hold Bent Knee Fallouts - 1 x daily - 7 x weekly - 2 sets - 10 reps - 5 seconds hold Seated Knee Flexion Extension AROM - 3-4 x daily - 7 x weekly - 2 sets - 10 reps - 5 seconds hold Seated Isometric Hip Adduction with Ball - 3-4 x daily - 7 x weekly - 2 sets - 10 reps - 5 seconds hold Sit to Stand with Counter Support - 3-4 x daily - 7 x weekly - 2 sets - 10 reps - 5 seconds hold Seated Hamstring Stretch - 2-3 x daily - 7 x weekly - 1 sets - 3 reps - 30 seconds hold Seated Gastroc Stretch with Strap - 2-3 x daily - 7 x weekly - 1 sets - 3 reps - 30 seconds hold Seated Hamstring Stretch with Strap - 2-3 x daily - 7 x weekly - 1 sets - 3 reps - 30 seconds hold

## 2020-01-01 ENCOUNTER — Other Ambulatory Visit: Payer: Self-pay

## 2020-01-01 ENCOUNTER — Other Ambulatory Visit: Payer: No Typology Code available for payment source | Admitting: Internal Medicine

## 2020-01-01 DIAGNOSIS — M1711 Unilateral primary osteoarthritis, right knee: Secondary | ICD-10-CM

## 2020-01-01 DIAGNOSIS — E538 Deficiency of other specified B group vitamins: Secondary | ICD-10-CM

## 2020-01-01 DIAGNOSIS — D649 Anemia, unspecified: Secondary | ICD-10-CM

## 2020-01-01 DIAGNOSIS — R5383 Other fatigue: Secondary | ICD-10-CM

## 2020-01-02 LAB — CBC WITH DIFFERENTIAL/PLATELET
Absolute Monocytes: 296 cells/uL (ref 200–950)
Basophils Absolute: 20 cells/uL (ref 0–200)
Basophils Relative: 0.6 %
Eosinophils Absolute: 163 cells/uL (ref 15–500)
Eosinophils Relative: 4.8 %
HCT: 35.9 % (ref 35.0–45.0)
Hemoglobin: 11.8 g/dL (ref 11.7–15.5)
Lymphs Abs: 1289 cells/uL (ref 850–3900)
MCH: 32.7 pg (ref 27.0–33.0)
MCHC: 32.9 g/dL (ref 32.0–36.0)
MCV: 99.4 fL (ref 80.0–100.0)
MPV: 11.2 fL (ref 7.5–12.5)
Monocytes Relative: 8.7 %
Neutro Abs: 1632 cells/uL (ref 1500–7800)
Neutrophils Relative %: 48 %
Platelets: 268 10*3/uL (ref 140–400)
RBC: 3.61 10*6/uL — ABNORMAL LOW (ref 3.80–5.10)
RDW: 12.7 % (ref 11.0–15.0)
Total Lymphocyte: 37.9 %
WBC: 3.4 10*3/uL — ABNORMAL LOW (ref 3.8–10.8)

## 2020-01-02 LAB — FOLATE: Folate: 14.1 ng/mL

## 2020-01-02 LAB — IRON, TOTAL/TOTAL IRON BINDING CAP
%SAT: 24 % (calc) (ref 16–45)
Iron: 88 ug/dL (ref 45–160)
TIBC: 367 mcg/dL (calc) (ref 250–450)

## 2020-01-02 LAB — FERRITIN: Ferritin: 72 ng/mL (ref 16–232)

## 2020-01-02 LAB — VITAMIN B12: Vitamin B-12: 555 pg/mL (ref 200–1100)

## 2020-01-03 ENCOUNTER — Encounter: Payer: Self-pay | Admitting: Obstetrics & Gynecology

## 2020-01-03 ENCOUNTER — Encounter: Payer: Self-pay | Admitting: Orthopaedic Surgery

## 2020-01-04 ENCOUNTER — Telehealth: Payer: Self-pay

## 2020-01-04 NOTE — Telephone Encounter (Signed)
AEX 04/2019 H/o knee sx on 10/15  Spoke with pt. Pt states sent the following mychart message to Dr Sabra Heck on 11/7. Pt states having returned urinary burning intermittently, vaginal discharge that was white "clumps" yesterday and now green discharge with odor today after shower. Pt denies any vaginal itching, abd cramps or vaginal bleeding. Pt took Diflucan Rx on 10/25 x 2 as prescribed and sx did resolve.  Pt states feels "something not right after having knee surgery and had pure wick inserted for urine.  Pt advised to have OV for further evaluation. Pt aware Dr Sabra Heck out of office. Pt ok to see another provider. Pt scheduled with Dr Talbert Nan as work-in appt on 11/9 at 115 pm. Pt agreeable to date and time of appt.  Encounter closed.  Jayme Cloud Demetrio Lapping to Megan Salon, MD     01/03/20 8:46 PM  Hello Dr.Miller, I  want to seek your expertise, I took the Diflucan both pills as prescribe but started having some burning last couple of days and I went to the rest room and wiped tonight and noticed  clumps of white discharge on the tissue ,i feel that things have not been right  vaginally since I left the hospital,they used something diffrent called a pure wick vaginally,could that  have something to do with what I am experiencing, please advise.

## 2020-01-04 NOTE — Telephone Encounter (Signed)
Patient is still having burning and discharge. Noticed during shower discharge was green after being white and clumpy.

## 2020-01-05 ENCOUNTER — Encounter: Payer: Self-pay | Admitting: Obstetrics and Gynecology

## 2020-01-05 ENCOUNTER — Other Ambulatory Visit: Payer: Self-pay

## 2020-01-05 ENCOUNTER — Encounter: Payer: Self-pay | Admitting: Physical Therapy

## 2020-01-05 ENCOUNTER — Ambulatory Visit (INDEPENDENT_AMBULATORY_CARE_PROVIDER_SITE_OTHER): Payer: No Typology Code available for payment source | Admitting: Obstetrics and Gynecology

## 2020-01-05 ENCOUNTER — Ambulatory Visit (INDEPENDENT_AMBULATORY_CARE_PROVIDER_SITE_OTHER): Payer: No Typology Code available for payment source | Admitting: Physical Therapy

## 2020-01-05 ENCOUNTER — Other Ambulatory Visit: Payer: Self-pay | Admitting: Obstetrics and Gynecology

## 2020-01-05 VITALS — BP 112/74 | HR 58 | Ht 67.5 in | Wt 267.8 lb

## 2020-01-05 DIAGNOSIS — R262 Difficulty in walking, not elsewhere classified: Secondary | ICD-10-CM | POA: Diagnosis not present

## 2020-01-05 DIAGNOSIS — R600 Localized edema: Secondary | ICD-10-CM

## 2020-01-05 DIAGNOSIS — M6281 Muscle weakness (generalized): Secondary | ICD-10-CM

## 2020-01-05 DIAGNOSIS — M25561 Pain in right knee: Secondary | ICD-10-CM

## 2020-01-05 DIAGNOSIS — R300 Dysuria: Secondary | ICD-10-CM | POA: Diagnosis not present

## 2020-01-05 DIAGNOSIS — N760 Acute vaginitis: Secondary | ICD-10-CM | POA: Diagnosis not present

## 2020-01-05 DIAGNOSIS — M25562 Pain in left knee: Secondary | ICD-10-CM | POA: Diagnosis not present

## 2020-01-05 MED ORDER — BETAMETHASONE VALERATE 0.1 % EX OINT: 1.0000 "application " | TOPICAL_OINTMENT | Freq: Two times a day (BID) | CUTANEOUS | 0 refills | Status: DC

## 2020-01-05 MED ORDER — FLUCONAZOLE 150 MG PO TABS
150.0000 mg | ORAL_TABLET | Freq: Once | ORAL | 0 refills | Status: DC
Start: 2020-01-05 — End: 2020-01-05

## 2020-01-05 MED FILL — BETAMETHASONE VALER 0.1% OI: 0.1 | 7 days supply | Qty: 15 | Fill #0

## 2020-01-05 MED FILL — FLUCONAZOLE 150 MG TABS: 150 | 3 days supply | Qty: 2 | Fill #0

## 2020-01-05 NOTE — Therapy (Signed)
[facility] [address] [place], [place], [address] Phone: [phone]   Fax:  [phone]  Physical Therapy Treatment  Patient Details  Name: Victoria [name] MRN: [id number] Date of Birth: Oct 12, 1960 Referring Provider (PT): Jean Rosenthal, MD   Encounter Date: 01/05/2020   PT End of Session - 01/05/20 0941    Visit Number 3    Number of Visits 16    Date for PT Re-Evaluation 04/01/20    PT Start Time 0932    PT Stop Time 1013    PT Time Calculation (min) 41 min    Activity Tolerance Patient tolerated treatment well    Behavior During Therapy [facility] for tasks assessed/performed           Past Medical History:  Diagnosis Date  . Bursitis    right shoulder  . Chronic leukopenia    intermittant since 2010, followed by Dr. Renold Genta now  . Degenerative joint disease of low back 09/02/15    Dr. Maureen Ralphs  . Fibrocystic breast   . GERD (gastroesophageal reflux disease)   . Headache    per pt more stress/tension  . Heart palpitations    SEE EPIC ENCOUTNER , CARDIOLOGY DR. Tressia Miners TURNER 2018; reports on 05-16-17 " i haven't had any bouts of those lately"    . Hemorrhoids   . History of Clostridium difficile infection 12/2010  . History of exercise intolerance    ETT on 04-27-2016-- negative ([facility] treadmill score 7)  . History of Helicobacter pylori infection 2001 and 09/ 2012  . HSV-2 infection    genital  . Hx of adenomatous colonic polyps 10/17/2007  . Hydradenitis    per pt currently treated with Humira injection  . Hypothyroidism, postsurgical 1980  . Medial meniscus tear    right knee  . Osteoarthritis   . Pernicious anemia    b12 def  . PONV (postoperative nausea and vomiting)   . 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 Tendonitis    right shoulder  . Varicose veins   . Vitamin B 12 deficiency   . Vitamin D deficiency     Past  Surgical History:  Procedure Laterality Date  . BREATH TEK H PYLORI N/A 05/03/2014   Procedure: BREATH TEK H PYLORI;  Surgeon: Kaylyn Lim, MD;  Location: [facility];  Service: General;  Laterality: N/A;  . CARDIOVASCULAR STRESS TEST  03/11/2013   dr Tressia Miners turner   Low risk nuclear study w/ a very small anterior perfusion defect that most likely represents breast attenuation artifact, less likely this could represent a true perfusion abnormality in the distribution of diagonal branch of the LAD/  normal LV function and wall motion , ef 66%  . CATARACT EXTRACTION W/ INTRAOCULAR LENS  IMPLANT, BILATERAL  10/2017  . CESAREAN SECTION  1985  . CHOLECYSTECTOMY OPEN  1987  . COLONOSCOPY  02/2017   Dr [name] ; per patient they found 7 polyps with polypectomy; on 5 year track   . FINE NEEDLE ASPIRATION Left 05/22/2017   Procedure: LEFT KNEE ASPIRATION;  Surgeon: Gaynelle Arabian, MD;  Location: [facility];  Service: Orthopedics;  Laterality: Left;  Marland Kitchen GASTRIC ROUX-EN-Y N/A 07/06/2014   Procedure: LAPAROSCOPIC ROUX-EN-Y GASTRIC BYPASS, ENTEROLYSIS OF ADHESIONS WITH UPPER ENDOSCOPY;  Surgeon: Johnathan Hausen, MD;  Location: [facility];  Service: General;  Laterality: N/A;  . HAMMER TOE SURGERY Bilateral 02/17/2008   bilateral foot digit's 2  and 5  . HIATAL HERNIA REPAIR N/A 07/06/2014   Procedure: LAPAROSCOPIC REPAIR OF HIATAL HERNIA;  Surgeon: Johnathan Hausen, MD;  Location: [facility];  Service: General;  Laterality: N/A;  . I & D KNEE WITH POLY EXCHANGE Left 12/11/2019   Procedure: LEFT KNEE POLY-LINER EXCHANGE;  Surgeon: Mcarthur Rossetti, MD;  Location: [place];  Service: Orthopedics;  Laterality: Left;  . KNEE ARTHROSCOPY Right 12/11/2019   Procedure: RIGHT KNEE ARTHROSCOPY WITH PARTIAL MEDIAL MENISCECTOMY;  Surgeon: Mcarthur Rossetti, MD;  Location: [place];  Service: Orthopedics;  Laterality: Right;  . KNEE ARTHROSCOPY WITH MEDIAL MENISECTOMY Left 07/17/2017   Procedure: LEFT KNEE ARTHROSCOPY WITH MEDIAL  LATERAL MENISECTOMY;  Surgeon: Gaynelle Arabian, MD;  [facility]: [facility];  Service: Orthopedics;  Laterality: Left;  Marland Kitchen MASS EXCISION N/A 10/11/2016   Procedure: EXCISION OF PERIANAL MASS;  Surgeon: [name], MD;  Location: [place];  Service: General;  Laterality: N/A;  . REFRACTIVE SURGERY    . THYROIDECTOMY  1980  . TOTAL ABDOMINAL HYSTERECTOMY  08-25-2002   dr Margaretha Glassing   ovaries retained  . TOTAL HIP ARTHROPLASTY Right 05/21/2012   Procedure: TOTAL HIP ARTHROPLASTY;  Surgeon: Gearlean Alf, MD;  [facility]: [facility];  Service: Orthopedics;  Laterality: Right;  . TOTAL HIP ARTHROPLASTY Left 01-31-2009  dr Wynelle Link  . TOTAL HIP REVISION Left 05/22/2017   Procedure: Left hip bearing surface and head revision;  Surgeon: Gaynelle Arabian, MD;  Location: [facility];  Service: Orthopedics;  Laterality: Left;  . TOTAL KNEE ARTHROPLASTY Left 01/20/2018   Procedure: LEFT TOTAL KNEE ARTHROPLASTY;  Surgeon: Gaynelle Arabian, MD;  Location: [facility];  Service: Orthopedics;  Laterality: Left;  30min  . TRANSTHORACIC ECHOCARDIOGRAM  05/03/2016   ef 55-60%/  trivial MR/  mild TR  . TUBAL LIGATION      There were no vitals filed for this visit.   Subjective Assessment - 01/05/20 0938    Subjective Pt reprots she has been doing her HEP. Pt reporting soreness more on the R. Pt pin point medial and lateral knee joint on  R LE.    Pertinent History Bilateral THA, L TKA, tendonitis, R shoulder bursitis, DJD back 2017, GERD, heart palpitations, OA, PONV, vit D deficiency, bit B12 deficiency    Diagnostic tests MRI    Patient Stated Goals walk without walker, I want to get back to work    Currently in Pain? Yes    Pain Score 8     Pain Location Knee    Pain Orientation Right;Left    Pain Descriptors / Indicators Aching;Sore    Pain Type Chronic pain    Pain Onset 1 to 4 weeks ago    Pain Frequency Constant                             [facility] Adult PT Treatment/Exercise -  01/05/20 0001      Knee/Hip Exercises: Supine   Quad Sets Strengthening;Both;15 reps    Short Arc Quad Sets Strengthening;15 reps    Short Arc Quad Sets Limitations holding 5 seconds    Heel Slides AROM;Strengthening;Both;10 reps    Straight Leg Raises Strengthening;Both;2 sets;10 reps    Other Supine Knee/Hip Exercises ball squeezes x 20 holding 5 seconds      Modalities   Modalities Vasopneumatic      Vasopneumatic   Number Minutes Vasopneumatic  10 minutes    Vasopnuematic Location  Knee  Vasopneumatic Pressure Low    Vasopneumatic Temperature  34                  PT Education - 01/05/20 0940    Education Details Importance of consistency with stretching and exercises.    Person(s) Educated Patient    Methods Explanation    Comprehension Verbalized understanding               PT Long Term Goals - 01/05/20 0945      PT LONG TERM GOAL #1   Title Pt will be indpedent in her HEP and porgression.    Status On-going      PT LONG TERM GOAL #2   Title Pt will be able to amb without an assistive device on communtiy surfaces.    Status On-going      PT LONG TERM GOAL #3   Title Pt will improve her bilateral LE strength to >/= +4/5 in order to improve fucntional mobility.    Status On-going      PT LONG TERM GOAL #4   Title Pt will be able to perform sit to stand in </= 20 seconds.    Status On-going      PT LONG TERM GOAL #5   Title Pt will be able to navigate 5 steps with rail with step over step pattern.    Status On-going                 Plan - 01/05/20 0175    Clinical Impression Statement Pt tolerating exercises well, more supine and sitting exercises due to pain of 8/10. Pt edu on importance of stretching and exericise and "it takes time to regain function". Continue skilled PT to progress towrad LTG's set.    Personal Factors and Comorbidities Comorbidity 3+    Comorbidities bilateral hip replacement, L TKA 2019, R shoulder bursitis,  tendonitis, DJD back 2017, heart palpations, OA, vit B12-def, vit D def    Examination-Activity Limitations Transfers;Sit;Squat;Stairs;Other;Stand    Examination-Participation Restrictions Community Activity;Other;Laundry;Occupation    Stability/Clinical Decision Making Evolving/Moderate complexity    Rehab Potential Good    PT Frequency 2x / week    PT Duration 8 weeks    PT Treatment/Interventions ADLs/Self Care Home Management;Cryotherapy;Electrical Stimulation;Gait training;Stair training;Functional mobility training;Therapeutic activities;Therapeutic exercise;Balance training;Patient/family education;Manual techniques;Taping;Passive range of motion;DME Instruction;Moist Heat    PT Next Visit Plan Check ITB due to lateral knee pain,     Nustep, LE strengthening, stretching, sit to stand, gait training    PT Home Exercise Plan Access Code: [email or web address]    Consulted and Agree with Plan of Care Patient           Patient will benefit from skilled therapeutic intervention in order to improve the following deficits and impairments:  Postural dysfunction, Decreased strength, Decreased mobility, Increased edema, Decreased balance, Impaired flexibility, Pain, Decreased activity tolerance, Obesity, Difficulty walking  Visit Diagnosis: Acute pain of right knee  Acute pain of left knee  Difficulty in walking, not elsewhere classified  Muscle weakness (generalized)  Localized edema     Problem List Patient Active Problem List   Diagnosis Date Noted  . Status post arthroscopy of right knee 12/11/2019  . 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 Acute medial meniscal tear, right, subsequent encounter 08/27/2019  . History of total left knee replacement 07/16/2019  . Peroneal neuropathy at knee, left 06/25/2019  . Lumbosacral radiculopathy 06/07/2019  . 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 IDA (iron  deficiency anemia) 10/16/2018  . OA (osteoarthritis) of knee  01/20/2018  . Acute meniscal tear, medial 07/17/2017  . 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 Insomnia 11/08/2016  . Pernicious anemia 11/08/2016  . Leukopenia 07/09/2016  . Hypocupremia 07/09/2016  . Palpitations 04/12/2016  . 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 Lap gastric bypass May 2016 07/06/2014  . History of Clostridium difficile infection 08/12/2011  . Postoperative hypothyroidism 08/26/2010  . Gastroesophageal reflux disease 08/26/2010  . Vitamin D deficiency 08/26/2010  . Fibrocystic disease of breast 08/26/2010  . Cobalamin deficiency 08/26/2010  . Obesity 03/20/2010  . Backache 03/20/2010    Oretha Caprice, PT, MPT 01/05/2020, 10:10 AM  [facility] [address] [place], [place], [address] Phone: [phone]   Fax:  [phone]  Name: Victoria [name] MRN: [id number] Date of Birth: December 14, 1960

## 2020-01-05 NOTE — Patient Instructions (Signed)
Vaginitis Vaginitis is a condition in which the vaginal tissue swells and becomes red (inflamed). This condition is most often caused by a change in the normal balance of bacteria and yeast that live in the vagina. This change causes an overgrowth of certain bacteria or yeast, which causes the inflammation. There are different types of vaginitis, but the most common types are:  Bacterial vaginosis.  Yeast infection (candidiasis).  Trichomoniasis vaginitis. This is a sexually transmitted disease (STD).  Viral vaginitis.  Atrophic vaginitis.  Allergic vaginitis. What are the causes? The cause of this condition depends on the type of vaginitis. It can be caused by:  Bacteria (bacterial vaginosis).  Yeast, which is a fungus (yeast infection).  A parasite (trichomoniasis vaginitis).  A virus (viral vaginitis).  Low hormone levels (atrophic vaginitis). Low hormone levels can occur during pregnancy, breastfeeding, or after menopause.  Irritants, such as bubble baths, scented tampons, and feminine sprays (allergic vaginitis). Other factors can change the normal balance of the yeast and bacteria that live in the vagina. These include:  Antibiotic medicines.  Poor hygiene.  Diaphragms, vaginal sponges, spermicides, birth control pills, and intrauterine devices (IUD).  Sex.  Infection.  Uncontrolled diabetes.  A weakened defense (immune) system. What increases the risk? This condition is more likely to develop in women who:  Smoke.  Use vaginal douches, scented tampons, or scented sanitary pads.  Wear tight-fitting pants.  Wear thong underwear.  Use oral birth control pills or an IUD.  Have sex without a condom.  Have multiple sex partners.  Have an STD.  Frequently use the spermicide nonoxynol-9.  Eat lots of foods high in sugar.  Have uncontrolled diabetes.  Have low estrogen levels.  Have a weakened immune system from an immune disorder or medical  treatment.  Are pregnant or breastfeeding. What are the signs or symptoms? Symptoms vary depending on the cause of the vaginitis. Common symptoms include:  Abnormal vaginal discharge. ? The discharge is white, gray, or yellow with bacterial vaginosis. ? The discharge is thick, white, and cheesy with a yeast infection. ? The discharge is frothy and yellow or greenish with trichomoniasis.  A bad vaginal smell. The smell is fishy with bacterial vaginosis.  Vaginal itching, pain, or swelling.  Sex that is painful.  Pain or burning when urinating. Sometimes there are no symptoms. How is this diagnosed? This condition is diagnosed based on your symptoms and medical history. A physical exam, including a pelvic exam, will also be done. You may also have other tests, including:  Tests to determine the pH level (acidity or alkalinity) of your vagina.  A whiff test, to assess the odor that results when a sample of your vaginal discharge is mixed with a potassium hydroxide solution.  Tests of vaginal fluid. A sample will be examined under a microscope. How is this treated? Treatment varies depending on the type of vaginitis you have. Your treatment may include:  Antibiotic creams or pills to treat bacterial vaginosis and trichomoniasis.  Antifungal medicines, such as vaginal creams or suppositories, to treat a yeast infection.  Medicine to ease discomfort if you have viral vaginitis. Your sexual partner should also be treated.  Estrogen delivered in a cream, pill, suppository, or vaginal ring to treat atrophic vaginitis. If vaginal dryness occurs, lubricants and moisturizing creams may help. You may need to avoid scented soaps, sprays, or douches.  Stopping use of a product that is causing allergic vaginitis. Then using a vaginal cream to treat the symptoms. Follow   these instructions at home: Lifestyle  Keep your genital area clean and dry. Avoid soap, and only rinse the area with  water.  Do not douche or use tampons until your health care provider says it is okay to do so. Use sanitary pads, if needed.  Do not have sex until your health care provider approves. When you can return to sex, practice safe sex and use condoms.  Wipe from front to back. This avoids the spread of bacteria from the rectum to the vagina. General instructions  Take over-the-counter and prescription medicines only as told by your health care provider.  If you were prescribed an antibiotic medicine, take or use it as told by your health care provider. Do not stop taking or using the antibiotic even if you start to feel better.  Keep all follow-up visits as told by your health care provider. This is important. How is this prevented?  Use mild, non-scented products. Do not use things that can irritate the vagina, such as fabric softeners. Avoid the following products if they are scented: ? Feminine sprays. ? Detergents. ? Tampons. ? Feminine hygiene products. ? Soaps or bubble baths.  Let air reach your genital area. ? Wear cotton underwear to reduce moisture buildup. ? Avoid wearing underwear while you sleep. ? Avoid wearing tight pants and underwear or nylons without a cotton panel. ? Avoid wearing thong underwear.  Take off any wet clothing, such as bathing suits, as soon as possible.  Practice safe sex and use condoms. Contact a health care provider if:  You have abdominal pain.  You have a fever.  You have symptoms that last for more than 2-3 days. Get help right away if:  You have a fever and your symptoms suddenly get worse. Summary  Vaginitis is a condition in which the vaginal tissue becomes inflamed.This condition is most often caused by a change in the normal balance of bacteria and yeast that live in the vagina.  Treatment varies depending on the type of vaginitis you have.  Do not douche, use tampons , or have sex until your health care provider approves. When  you can return to sex, practice safe sex and use condoms. This information is not intended to replace advice given to you by your health care provider. Make sure you discuss any questions you have with your health care provider. Document Revised: 01/25/2017 Document Reviewed: 03/20/2016 Elsevier Patient Education  2020 [employer]  

## 2020-01-05 NOTE — Progress Notes (Signed)
GYNECOLOGY  VISIT   HPI: 59 y.o.   Married Black or [place] American Not Hispanic or Latino  female   [phone] with No LMP recorded. Patient has had a hysterectomy.   here for vaginal discharge with burning and itching.  She says that on Sunday her discharge was white and clumping and on Monday morning her discharge appeared to be green.   She has seen Dr Sabra Heck with vaginal discharge and a negative w/u previously. She has a script for a estring and is on divigel. H/O hysterectomy.   She had bilateral knee surgery last month. She was off of all of her ERT until restarting the divigel last night. She hasn't used vaginal estrogen for a long time.  After her surgery, Dr Sabra Heck called in diflucan for her. In the last week she has been having vaginal burning. She had some clumpy vaginal d/c 2 days ago. The d/c went from white to green.  No urinary frequency or urgency to void. She has some burning when she voids intermittently.   GYNECOLOGIC HISTORY: No LMP recorded. Patient has had a hysterectomy. Contraception:none  Menopausal hormone therapy: none         OB History    Gravida  2   Para  1   Term      Preterm  1   AB  1   Living  2     SAB      TAB  1   Ectopic      Multiple  1   Live Births  2              Patient Active Problem List   Diagnosis Date Noted  . Status post arthroscopy of right knee 12/11/2019  . 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 Acute medial meniscal tear, right, subsequent encounter 08/27/2019  . History of total left knee replacement 07/16/2019  . Peroneal neuropathy at knee, left 06/25/2019  . Lumbosacral radiculopathy 06/07/2019  . 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 IDA (iron deficiency anemia) 10/16/2018  . OA (osteoarthritis) of knee 01/20/2018  . Acute meniscal tear, medial 07/17/2017  . 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 Insomnia  11/08/2016  . Pernicious anemia 11/08/2016  . Leukopenia 07/09/2016  . Hypocupremia 07/09/2016  . Palpitations 04/12/2016  . 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 Lap gastric bypass May 2016 07/06/2014  . History of Clostridium difficile infection 08/12/2011  . Postoperative hypothyroidism 08/26/2010  . Gastroesophageal reflux disease 08/26/2010  . Vitamin D deficiency 08/26/2010  . Fibrocystic disease of breast 08/26/2010  . Cobalamin deficiency 08/26/2010  . Obesity 03/20/2010  . Backache 03/20/2010    Past Medical History:  Diagnosis Date  . Bursitis    right shoulder  . Chronic leukopenia    intermittant since 2010, followed by Dr. Renold Genta now  . Degenerative joint disease of low back 09/02/15    Dr. Maureen Ralphs  . Fibrocystic breast   . GERD (gastroesophageal reflux disease)   . Headache    per pt more stress/tension  . Heart palpitations    SEE EPIC ENCOUTNER , CARDIOLOGY DR. Tressia Miners TURNER 2018; reports on 05-16-17 " i haven't had any bouts of those lately"    . Hemorrhoids   . History of Clostridium difficile infection 12/2010  . History of exercise intolerance    ETT on 04-27-2016-- negative ([facility] treadmill score 7)  . History  of Helicobacter pylori infection 2001 and 09/ 2012  . HSV-2 infection    genital  . Hx of adenomatous colonic polyps 10/17/2007  . Hydradenitis    per pt currently treated with Humira injection  . Hypothyroidism, postsurgical 1980  . Medial meniscus tear    right knee  . Osteoarthritis   . Pernicious anemia    b12 def  . PONV (postoperative nausea and vomiting)   . 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 Tendonitis    right shoulder  . Varicose veins   . Vitamin B 12 deficiency   . Vitamin D deficiency     Past Surgical History:  Procedure Laterality Date  . BREATH TEK H PYLORI N/A 05/03/2014   Procedure: BREATH  TEK H PYLORI;  Surgeon: Kaylyn Lim, MD;  Location: [facility];  Service: General;  Laterality: N/A;  . CARDIOVASCULAR STRESS TEST  03/11/2013   dr Tressia Miners turner   Low risk nuclear study w/ a very small anterior perfusion defect that most likely represents breast attenuation artifact, less likely this could represent a true perfusion abnormality in the distribution of diagonal branch of the LAD/  normal LV function and wall motion , ef 66%  . CATARACT EXTRACTION W/ INTRAOCULAR LENS  IMPLANT, BILATERAL  10/2017  . CESAREAN SECTION  1985  . CHOLECYSTECTOMY OPEN  1987  . COLONOSCOPY  02/2017   Dr [name] ; per patient they found 7 polyps with polypectomy; on 5 year track   . FINE NEEDLE ASPIRATION Left 05/22/2017   Procedure: LEFT KNEE ASPIRATION;  Surgeon: Gaynelle Arabian, MD;  Location: [facility];  Service: Orthopedics;  Laterality: Left;  Marland Kitchen GASTRIC ROUX-EN-Y N/A 07/06/2014   Procedure: LAPAROSCOPIC ROUX-EN-Y GASTRIC BYPASS, ENTEROLYSIS OF ADHESIONS WITH UPPER ENDOSCOPY;  Surgeon: Johnathan Hausen, MD;  Location: [facility];  Service: General;  Laterality: N/A;  . HAMMER TOE SURGERY Bilateral 02/17/2008   bilateral foot digit's 2 and 5  . HIATAL HERNIA REPAIR N/A 07/06/2014   Procedure: LAPAROSCOPIC REPAIR OF HIATAL HERNIA;  Surgeon: Johnathan Hausen, MD;  Location: [facility];  Service: General;  Laterality: N/A;  . I & D KNEE WITH POLY EXCHANGE Left 12/11/2019   Procedure: LEFT KNEE POLY-LINER EXCHANGE;  Surgeon: Mcarthur Rossetti, MD;  Location: [place];  Service: Orthopedics;  Laterality: Left;  . KNEE ARTHROSCOPY Right 12/11/2019   Procedure: RIGHT KNEE ARTHROSCOPY WITH PARTIAL MEDIAL MENISCECTOMY;  Surgeon: Mcarthur Rossetti, MD;  Location: [place] [facility];  Service: Orthopedics;  Laterality: Right;  . KNEE ARTHROSCOPY WITH MEDIAL MENISECTOMY Left 07/17/2017   Procedure: LEFT KNEE ARTHROSCOPY WITH MEDIAL LATERAL MENISECTOMY;  Surgeon: Gaynelle Arabian, MD;  Location: [facility];  Service: Orthopedics;  Laterality:  Left;  Marland Kitchen MASS EXCISION N/A 10/11/2016   Procedure: EXCISION OF PERIANAL MASS;  Surgeon: [name], MD;  Location: [place];  Service: General;  Laterality: N/A;  . REFRACTIVE SURGERY    . THYROIDECTOMY  1980  . TOTAL ABDOMINAL HYSTERECTOMY  08-25-2002   dr Margaretha Glassing   ovaries retained  . TOTAL HIP ARTHROPLASTY Right 05/21/2012   Procedure: TOTAL HIP ARTHROPLASTY;  Surgeon: Gearlean Alf, MD;  Location: [facility];  Service: Orthopedics;  Laterality: Right;  . TOTAL HIP ARTHROPLASTY Left 01-31-2009  dr Wynelle Link  . TOTAL HIP REVISION Left 05/22/2017   Procedure: Left hip bearing surface and head revision;  Surgeon: Gaynelle Arabian, MD;  Location: [facility];  Service:  Orthopedics;  Laterality: Left;  . TOTAL KNEE ARTHROPLASTY Left 01/20/2018   Procedure: LEFT TOTAL KNEE ARTHROPLASTY;  Surgeon: Gaynelle Arabian, MD;  Location: [facility];  Service: Orthopedics;  Laterality: Left;  58min  . TRANSTHORACIC ECHOCARDIOGRAM  05/03/2016   ef 55-60%/  trivial MR/  mild TR  . TUBAL LIGATION      Current Outpatient Medications  Medication Sig Dispense Refill  . Adalimumab (HUMIRA PEN) 40 MG/0.8ML PNKT Inject one pen under the skin each week. 4 each 5  . ALPRAZolam (XANAX) 1 MG tablet TAKE 1 TABLET BY MOUTH EVERY NIGHT AT BEDTIME (Patient taking differently: Take 1 mg by mouth at bedtime as needed for sleep. ) 90 tablet 0  . Calcium Carb-Cholecalciferol (CALCIUM + D3 PO) Take 1 tablet by mouth daily.    . celecoxib (CELEBREX) 200 MG capsule Take 1 capsule (200 mg total) by mouth 2 (two) times daily between meals as needed. 60 capsule 1  . cyanocobalamin (,VITAMIN B-12,) 1000 MCG/ML injection INJECT 1 ML INTRAMUSCULARLY EVERY MONTH (Patient taking differently: Inject 1,000 mcg into the muscle every 30 (thirty) days. INJECT 1 ML INTRAMUSCULARLY EVERY MONTH) 3 mL 11  . DIVIGEL 1 MG/GM GEL APPLY TO THE SKIN AT BEDTIME (Patient taking differently: Apply 1 application topically at bedtime. ) 90 g 4  .  estradiol (ESTRING) 2 MG vaginal ring Place 2 mg vaginally every 3 (three) months. follow package directions 1 each 3  . gabapentin (NEURONTIN) 600 MG tablet TAKE 1 TABLET BY MOUTH 3 TIMES DAILY. 90 tablet 2  . HYDROmorphone (DILAUDID) 4 MG tablet Take 1 tablet (4 mg total) by mouth every 6 (six) hours as needed for severe pain. 30 tablet 0  . levothyroxine (SYNTHROID) 112 MCG tablet TAKE 1 TABLET BY MOUTH ONCE A DAY (Patient taking differently: Take 112 mcg by mouth daily at 2 am. During nighttime bathroom break) 90 tablet 2  . Multiple Vitamin (MULTIVITAMIN WITH MINERALS) TABS tablet Take 1 tablet by mouth daily.    . pantoprazole (PROTONIX) 40 MG tablet TAKE 1 TABLET BY MOUTH TWICE DAILY--- PRN (Patient taking differently: Take 40 mg by mouth 2 (two) times daily as needed (indigestion/acid reflux.). )    . polyethylene glycol (MIRALAX / GLYCOLAX) packet Take 17 g by mouth daily as needed for mild constipation.    . promethazine (PHENERGAN) 25 MG tablet TAKE 1 TABLET BY MOUTH EVERY 8 HOURS AS NEEDED FOR NAUSEA AND VOMITING (Patient taking differently: Take 25 mg by mouth every 8 (eight) hours as needed for nausea or vomiting. ) 30 tablet 5  . Rimegepant Sulfate (NURTEC) 75 MG TBDP Take 75 mg by mouth daily as needed. For migraines. Take as close to onset of migraine as possible. One daily maximum. (Patient taking differently: Take 75 mg by mouth daily as needed (onset of migraine (Max 1 tablet/24hr)). For migraines. Take as close to onset of migraine as possible. One daily maximum.) 10 tablet 11  . tiZANidine (ZANAFLEX) 4 MG tablet Take 1 tablet (4 mg total) by mouth every 8 (eight) hours as needed for muscle spasms. 40 tablet 1  . traZODone (DESYREL) 50 MG tablet Take 1 tablet (50 mg total) by mouth at bedtime. (Patient taking differently: Take 50 mg by mouth at bedtime as needed for sleep. ) 30 tablet 1  . valACYclovir (VALTREX) 500 MG tablet TAKE 1 TABLET BY MOUTH ONCE DAILY (Patient taking  differently: Take 500 mg by mouth daily as needed (outbreaks). ) 90 tablet 3  No current facility-administered medications for this visit.   Facility-Administered Medications Ordered in Other Visits  Medication Dose Route Frequency Provider Last Rate Last Admin  . gadobenate dimeglumine (MULTIHANCE) injection 20 mL  20 mL Intravenous Once PRN Melvenia Beam, MD         ALLERGIES: Other and Estradiol  Family History  Problem Relation Age of Onset  . Diabetes Father   . Diabetes Sister   . Hypertension Sister   . Hypertension Mother   . Headache Mother     Social History   Socioeconomic History  . Marital status: Married    Spouse name: Not on file  . Number of children: 2  . Years of education: Not on file  . Highest education level: Not on file  Occupational History  . Occupation: nurse  Tobacco Use  . Smoking status: Never Smoker  . Smokeless tobacco: Never Used  Vaping Use  . Vaping Use: Never used  Substance and Sexual Activity  . Alcohol use: No  . Drug use: No  . Sexual activity: Not Currently    Partners: Male    Birth control/protection: Surgical    Comment: hysterectomy  Other Topics Concern  . Not on file  Social History Narrative   Lives at home with spouse   Right handed   Caffeine: diet zero mtn dew, occasionally    Social Determinants of Health   Financial Resource Strain:   . Difficulty of Paying Living Expenses: Not on file  Food Insecurity:   . Worried About Charity fundraiser in the Last Year: Not on file  . Ran Out of Food in the Last Year: Not on file  Transportation Needs:   . Lack of Transportation (Medical): Not on file  . Lack of Transportation (Non-Medical): Not on file  Physical Activity:   . Days of Exercise per Week: Not on file  . Minutes of Exercise per Session: Not on file  Stress:   . Feeling of Stress : Not on file  Social Connections:   . Frequency of Communication with Friends and Family: Not on file  . Frequency  of Social Gatherings with Friends and Family: Not on file  . Attends Religious Services: Not on file  . Active Member of Clubs or Organizations: Not on file  . Attends Archivist Meetings: Not on file  . Marital Status: Not on file  Intimate Partner Violence:   . Fear of Current or Ex-Partner: Not on file  . Emotionally Abused: Not on file  . Physically Abused: Not on file  . Sexually Abused: Not on file    Review of Systems  All other systems reviewed and are negative.   PHYSICAL EXAMINATION:    BP 112/74   Pulse (!) 58   Ht 5' 7.5" (1.715 m)   Wt 267 lb 12.8 oz (121.5 kg)   SpO2 100%   BMI 41.32 kg/m     General appearance: alert, cooperative and appears stated age  Pelvic: External genitalia:  no lesions              Urethra:  normal appearing urethra with no masses, tenderness or lesions              Bartholins and Skenes: normal                 Vagina: mild erythema, large amount of thick, clumpy, green tinged vaginal discharge c/w a yeast infection  Cervix: absent               Chaperone was present for exam.  Wet prep: no clue, no trich, +++++ wbc KOH: no yeast PH: 5   ASSESSMENT Vaginitis, exam c/w yeast but no yeast seen on vaginal slides Intermittent dysuria    PLAN Send nuswab vaginitis panel Treat with diflucan. Iif nuswab is positive for yeast and her symptoms don't  resolve will call in terazol, she may have decreased absorption with her h/o gastric bypass. Steroid ointment  CCUA for ua, c&s

## 2020-01-06 ENCOUNTER — Telehealth: Payer: Self-pay

## 2020-01-06 LAB — URINALYSIS, MICROSCOPIC ONLY
Bacteria, UA: NONE SEEN
Casts: NONE SEEN /lpf
WBC, UA: NONE SEEN /hpf (ref 0–5)

## 2020-01-06 NOTE — Telephone Encounter (Signed)
Patient has a question regarding the medication that was prescribed on yesterday.

## 2020-01-06 NOTE — Telephone Encounter (Signed)
Left message for pt to return call to triage RN. 

## 2020-01-06 NOTE — Telephone Encounter (Signed)
Spoke with pt. Pt calling to clarify Valisone Rx instructions. Advised and reviewed with pt Rx instructions. Pt agreeable and verbalized understanding.  Encounter closed

## 2020-01-07 ENCOUNTER — Ambulatory Visit (INDEPENDENT_AMBULATORY_CARE_PROVIDER_SITE_OTHER): Payer: No Typology Code available for payment source | Admitting: Internal Medicine

## 2020-01-07 ENCOUNTER — Encounter: Payer: Self-pay | Admitting: Internal Medicine

## 2020-01-07 ENCOUNTER — Telehealth: Payer: Self-pay

## 2020-01-07 ENCOUNTER — Other Ambulatory Visit: Payer: Self-pay

## 2020-01-07 VITALS — BP 120/80 | HR 69 | Ht 67.5 in | Wt 259.0 lb

## 2020-01-07 DIAGNOSIS — Z9884 Bariatric surgery status: Secondary | ICD-10-CM | POA: Diagnosis not present

## 2020-01-07 DIAGNOSIS — L732 Hidradenitis suppurativa: Secondary | ICD-10-CM | POA: Diagnosis not present

## 2020-01-07 DIAGNOSIS — E039 Hypothyroidism, unspecified: Secondary | ICD-10-CM

## 2020-01-07 DIAGNOSIS — D708 Other neutropenia: Secondary | ICD-10-CM

## 2020-01-07 DIAGNOSIS — E538 Deficiency of other specified B group vitamins: Secondary | ICD-10-CM

## 2020-01-07 DIAGNOSIS — Z8669 Personal history of other diseases of the nervous system and sense organs: Secondary | ICD-10-CM

## 2020-01-07 DIAGNOSIS — Z79899 Other long term (current) drug therapy: Secondary | ICD-10-CM

## 2020-01-07 DIAGNOSIS — Z79620 Long term (current) use of immunosuppressive biologic: Secondary | ICD-10-CM

## 2020-01-07 LAB — URINE CULTURE

## 2020-01-07 LAB — TSH: TSH: 0.72 mIU/L (ref 0.40–4.50)

## 2020-01-07 NOTE — Progress Notes (Signed)
   Subjective:    Patient ID: Gevena [name], female    DOB: 1960/07/16, 59 y.o.   MRN: [id number]  HPI  59 year old Female seen for 6 month follow up.   Has had  vaginal discharge since hospitalized for recent bilateral knee surgery where a device was used by nursing for urinary incontinence and has seen GYN  for this.  Hx of B12 deficiency and gives herself IM B12 injections monthly. Level is 555. Hgb stable s/p recent knee surgery at 11.8 grams. Folate and iron levels are normal. 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    Ambulating with walker and doing well with Gabapentin for pain.  TSH drawn today on Levothyroxine 0.112 mg daily  Hx hidradenitis suppurativa treated with Humira.  Migraine headaches managed by Dr. Jaynee Eagles.  History of gastric by-pass surgery in 2016 with pre-surgery weight 296 pounds  and now 259 pounds today.  She has a history of iron deficiency requiring iron infusions.  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  In May 2019 she had an acute medial meniscal tear and underwent left knee arthroscopic surgery with meniscal debridement.   Review of Systems- knee pain is tolerable s/p surgery on October 15. Has repair right knee arthroscopy with partial medical menisectomy. Had left knee poly-liner exchange.     Objective:   Physical Exam BP 120/80 pulse 69 pulse ox 96% Weight 259 pounds.  Neck is supple with thyromegaly.  Chest is clear Cor: RRR without murmurs.  No thyromegaly.  Affect is normal.       Assessment & Plan:  History of iron deficiency-iron level is normal at 88  History of B12 deficiency-gives self monthly B12 injections and B12 level is within normal limits.  B12 level is 555.  Folate is normal.  Status post bilateral knee surgery per Dr. Ninfa Linden and recuperating on walker.  Pain tolerable with gabapentin.  Hypothyroidism-TSH is drawn  and pending  Hidradenitis-treated with Humira and stable  History of gastric bypass surgery  History of migraine headaches stable with treatment by Dr. Jaynee Eagles  Plan: She will return in 6 months or as needed.

## 2020-01-07 NOTE — Telephone Encounter (Signed)
Spoke with patient and reviewed results of urine culture. Patient states she is not having as much burning after 1 Diflucan tablet, but still having a little. She knows to take second Diflucan if symptoms not completely resolved.  Discussed results of urine culture and how Dr.Jertson wanted her to have ccua in 2 weeks to see if still positive for blood.  Made nurse visit for 01-18-20 at 10:15am.

## 2020-01-07 NOTE — Telephone Encounter (Signed)
-----   Message from Salvadore Dom, MD sent at 01/07/2020 12:37 PM EST ----- Please let the patient know that her urine culture was negative, but she did have blood in her urine. Her vaginitis panel is pending. See if she is feeling any better.  Please her come back for a nurse visit in a few weeks for a ccua, if dip + for blood then send it for a micro ua

## 2020-01-08 ENCOUNTER — Telehealth: Payer: Self-pay | Admitting: *Deleted

## 2020-01-08 ENCOUNTER — Ambulatory Visit: Payer: No Typology Code available for payment source | Attending: Internal Medicine

## 2020-01-08 ENCOUNTER — Encounter: Payer: Self-pay | Admitting: Rehabilitative and Restorative Service Providers"

## 2020-01-08 ENCOUNTER — Ambulatory Visit (INDEPENDENT_AMBULATORY_CARE_PROVIDER_SITE_OTHER): Payer: No Typology Code available for payment source | Admitting: Rehabilitative and Restorative Service Providers"

## 2020-01-08 DIAGNOSIS — M25562 Pain in left knee: Secondary | ICD-10-CM | POA: Diagnosis not present

## 2020-01-08 DIAGNOSIS — R600 Localized edema: Secondary | ICD-10-CM

## 2020-01-08 DIAGNOSIS — M6281 Muscle weakness (generalized): Secondary | ICD-10-CM

## 2020-01-08 DIAGNOSIS — M25561 Pain in right knee: Secondary | ICD-10-CM

## 2020-01-08 DIAGNOSIS — R262 Difficulty in walking, not elsewhere classified: Secondary | ICD-10-CM

## 2020-01-08 DIAGNOSIS — Z23 Encounter for immunization: Secondary | ICD-10-CM

## 2020-01-08 LAB — NUSWAB VAGINITIS (VG)
Candida albicans, NAA: NEGATIVE
Candida glabrata, NAA: NEGATIVE
Trich vag by NAA: NEGATIVE

## 2020-01-08 NOTE — Telephone Encounter (Signed)
Spoke with patient. Advised per Dr. Talbert Nan.  OV scheduled for 11/15 at 2:15pm.  Patient is agreeable to date and time.  Encounter closed.

## 2020-01-08 NOTE — Telephone Encounter (Signed)
Reviewed with Dr. Talbert Nan, agreeable to plan. Second diflucan not recommended, OV needed for further evaluation.   Call returned to patient, left detailed message, ok per dpr. Advised per Dr. Talbert Nan. Return call to office to schedule OV.

## 2020-01-08 NOTE — Therapy (Signed)
[facility] Physical Therapy [address] [place], [place], [address] Phone: [phone]   Fax:  [phone]  Physical Therapy Treatment  Patient Details  Name: Victoria [name] MRN: [id number] Date of Birth: April 21, 1960 Referring Provider (PT): Jean Rosenthal, MD   Encounter Date: 01/08/2020   PT End of Session - 01/08/20 0941    Visit Number 4    Number of Visits 16    Date for PT Re-Evaluation 04/01/20    PT Start Time 0846    PT Stop Time 0928    PT Time Calculation (min) 42 min    Activity Tolerance Patient limited by pain    Behavior During Therapy [facility] for tasks assessed/performed           Past Medical History:  Diagnosis Date  . Bursitis    right shoulder  . Chronic leukopenia    intermittant since 2010, followed by Dr. Renold Genta now  . Degenerative joint disease of low back 09/02/15    Dr. Maureen Ralphs  . Fibrocystic breast   . GERD (gastroesophageal reflux disease)   . Headache    per pt more stress/tension  . Heart palpitations    SEE EPIC ENCOUTNER , CARDIOLOGY DR. Tressia Miners TURNER 2018; reports on 05-16-17 " i haven't had any bouts of those lately"    . Hemorrhoids   . History of Clostridium difficile infection 12/2010  . History of exercise intolerance    ETT on 04-27-2016-- negative ([facility] treadmill score 7)  . History of Helicobacter pylori infection 2001 and 09/ 2012  . HSV-2 infection    genital  . Hx of adenomatous colonic polyps 10/17/2007  . Hydradenitis    per pt currently treated with Humira injection  . Hypothyroidism, postsurgical 1980  . Medial meniscus tear    right knee  . Osteoarthritis   . Pernicious anemia    b12 def  . PONV (postoperative nausea and vomiting)   . 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 Tendonitis    right shoulder  . Varicose veins   . Vitamin B 12 deficiency   . Vitamin D deficiency     Past  Surgical History:  Procedure Laterality Date  . BREATH TEK H PYLORI N/A 05/03/2014   Procedure: BREATH TEK H PYLORI;  Surgeon: Kaylyn Lim, MD;  [facility];  Service: General;  Laterality: N/A;  . CARDIOVASCULAR STRESS TEST  03/11/2013   dr Tressia Miners turner   Low risk nuclear study w/ a very small anterior perfusion defect that most likely represents breast attenuation artifact, less likely this could represent a true perfusion abnormality in the distribution of diagonal branch of the LAD/  normal LV function and wall motion , ef 66%  . CATARACT EXTRACTION W/ INTRAOCULAR LENS  IMPLANT, BILATERAL  10/2017  . CESAREAN SECTION  1985  . CHOLECYSTECTOMY OPEN  1987  . COLONOSCOPY  02/2017   Dr [name] ; per patient they found 7 polyps with polypectomy; on 5 year track   . FINE NEEDLE ASPIRATION Left 05/22/2017   Procedure: LEFT KNEE ASPIRATION;  Surgeon: Gaynelle Arabian, MD;  [facility];  Service: Orthopedics;  Laterality: Left;  Marland Kitchen GASTRIC ROUX-EN-Y N/A 07/06/2014   Procedure: LAPAROSCOPIC ROUX-EN-Y GASTRIC BYPASS, ENTEROLYSIS OF ADHESIONS WITH UPPER ENDOSCOPY;  Surgeon: Johnathan Hausen, MD;  [facility];  Service: General;  Laterality: N/A;  . HAMMER TOE SURGERY Bilateral 02/17/2008   bilateral foot digit's 2  and 5  . HIATAL HERNIA REPAIR N/A 07/06/2014   Procedure: LAPAROSCOPIC REPAIR OF HIATAL HERNIA;  Surgeon: Johnathan Hausen, MD;  [facility];  Service: General;  Laterality: N/A;  . I & D KNEE WITH POLY EXCHANGE Left 12/11/2019   Procedure: LEFT KNEE POLY-LINER EXCHANGE;  Surgeon: Mcarthur Rossetti, MD;  Location: [place];  Service: Orthopedics;  Laterality: Left;  . KNEE ARTHROSCOPY Right 12/11/2019   Procedure: RIGHT KNEE ARTHROSCOPY WITH PARTIAL MEDIAL MENISCECTOMY;  Surgeon: Mcarthur Rossetti, MD;  Location: [place];  Service: Orthopedics;  Laterality: Right;  . KNEE ARTHROSCOPY WITH MEDIAL MENISECTOMY Left 07/17/2017   Procedure: LEFT KNEE ARTHROSCOPY WITH MEDIAL  LATERAL MENISECTOMY;  Surgeon: Gaynelle Arabian, MD;  [facility];  Service: Orthopedics;  Laterality: Left;  Marland Kitchen MASS EXCISION N/A 10/11/2016   Procedure: EXCISION OF PERIANAL MASS;  Surgeon: [name], MD;  Location: [place];  Service: General;  Laterality: N/A;  . REFRACTIVE SURGERY    . THYROIDECTOMY  1980  . TOTAL ABDOMINAL HYSTERECTOMY  08-25-2002   dr Margaretha Glassing   ovaries retained  . TOTAL HIP ARTHROPLASTY Right 05/21/2012   Procedure: TOTAL HIP ARTHROPLASTY;  Surgeon: Gearlean Alf, MD;  [facility];  Service: Orthopedics;  Laterality: Right;  . TOTAL HIP ARTHROPLASTY Left 01-31-2009  dr Wynelle Link  . TOTAL HIP REVISION Left 05/22/2017   Procedure: Left hip bearing surface and head revision;  Surgeon: Gaynelle Arabian, MD;  [facility];  Service: Orthopedics;  Laterality: Left;  . TOTAL KNEE ARTHROPLASTY Left 01/20/2018   Procedure: LEFT TOTAL KNEE ARTHROPLASTY;  Surgeon: Gaynelle Arabian, MD;  [facility];  Service: Orthopedics;  Laterality: Left;  4min  . TRANSTHORACIC ECHOCARDIOGRAM  05/03/2016   ef 55-60%/  trivial MR/  mild TR  . TUBAL LIGATION      There were no vitals filed for this visit.   Subjective Assessment - 01/08/20 0851    Subjective Pt. stated 6-7/10 ache today.  Pt. stated Rt leg on outside has been giving her trouble.    Pertinent History Bilateral THA, L TKA, tendonitis, R shoulder bursitis, DJD back 2017, GERD, heart palpitations, OA, PONV, vit D deficiency, bit B12 deficiency    Diagnostic tests MRI    Patient Stated Goals walk without walker, I want to get back to work    Currently in Pain? Yes    Pain Score 7    Rt reported at 7/10   Pain Location Knee    Pain Orientation Right;Left    Pain Descriptors / Indicators Aching    Pain Type Chronic pain    Pain Onset 1 to 4 weeks ago    Pain Frequency Constant    Aggravating Factors  constant, standing, nighttime    Pain Relieving Factors medicine    Effect of Pain on  Daily Activities Limited in all ADLs                             [facility] Adult PT Treatment/Exercise - 01/08/20 0001      Neuro Re-ed    Neuro Re-ed Details  anterior/posterior DL weight sway 20 x (SBA/Min A), retro step x 10 Rt LE posterior, Lt 15x       Exercises   Exercises Other Exercises    Other Exercises  HEP cues for long duration flexion/extension stretching bilateral      Knee/Hip Exercises: Aerobic   Nustep Lvl 5 10  mins      Knee/Hip Exercises: Seated   Other Seated Knee/Hip Exercises slr 2 x 10 bilateral    Sit to Sand 10 reps;without UE support   24 inch chair, eccentric     Manual Therapy   Manual therapy comments seated MWM flexion, Ir, distraction bilateral knee, extension stretching overpressure holds                  PT Education - 01/08/20 0939    Education Details HEP education as documented    Person(s) Educated Patient    Methods Explanation    Comprehension Verbalized understanding               PT Long Term Goals - 01/05/20 0945      PT LONG TERM GOAL #1   Title Pt will be indpedent in her HEP and porgression.    Status On-going      PT LONG TERM GOAL #2   Title Pt will be able to amb without an assistive device on communtiy surfaces.    Status On-going      PT LONG TERM GOAL #3   Title Pt will improve her bilateral LE strength to >/= +4/5 in order to improve fucntional mobility.    Status On-going      PT LONG TERM GOAL #4   Title Pt will be able to perform sit to stand in </= 20 seconds.    Status On-going      PT LONG TERM GOAL #5   Title Pt will be able to navigate 5 steps with rail with step over step pattern.    Status On-going                 Plan - 01/08/20 0939    Clinical Impression Statement Fatigue and pain limiting intervention overall but able to perform fair on balance activity with occasional Min A to prevent LoB.  Pt. may continue to benefit from skilled PT services to improve  strength mobility and balance control for progress towards goals.    Personal Factors and Comorbidities Comorbidity 3+    Comorbidities bilateral hip replacement, L TKA 2019, R shoulder bursitis, tendonitis, DJD back 2017, heart palpations, OA, vit B12-def, vit D def    Examination-Activity Limitations Transfers;Sit;Squat;Stairs;Other;Stand    Examination-Participation Restrictions Community Activity;Other;Laundry;Occupation    Stability/Clinical Decision Making Evolving/Moderate complexity    Rehab Potential Good    PT Frequency 2x / week    PT Duration 8 weeks    PT Treatment/Interventions ADLs/Self Care Home Management;Cryotherapy;Electrical Stimulation;Gait training;Stair training;Functional mobility training;Therapeutic activities;Therapeutic exercise;Balance training;Patient/family education;Manual techniques;Taping;Passive range of motion;DME Instruction;Moist Heat    PT Next Visit Plan Progress mobility of knees, strengthening in [place], CKC, progress static and dynamic balance on non compliant surfaces.    PT Home Exercise Plan Access Code: [email or web address]    Consulted and Agree with Plan of Care Patient           Patient will benefit from skilled therapeutic intervention in order to improve the following deficits and impairments:  Postural dysfunction, Decreased strength, Decreased mobility, Increased edema, Decreased balance, Impaired flexibility, Pain, Decreased activity tolerance, Obesity, Difficulty walking  Visit Diagnosis: Acute pain of right knee  Acute pain of left knee  Difficulty in walking, not elsewhere classified  Muscle weakness (generalized)  Localized edema     Problem List Patient Active Problem List   Diagnosis Date Noted  . Status post arthroscopy of right knee 12/11/2019  . 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 Acute medial meniscal tear, right, subsequent encounter 08/27/2019  . History of total left knee replacement  07/16/2019  . Peroneal neuropathy at knee, left 06/25/2019  . Lumbosacral radiculopathy 06/07/2019  . 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 IDA (iron deficiency anemia) 10/16/2018  . OA (osteoarthritis) of knee 01/20/2018  . Acute meniscal tear, medial 07/17/2017  . 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 Insomnia 11/08/2016  . Pernicious anemia 11/08/2016  . Leukopenia 07/09/2016  . Hypocupremia 07/09/2016  . Palpitations 04/12/2016  . 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 Lap gastric bypass May 2016 07/06/2014  . History of Clostridium difficile infection 08/12/2011  . Postoperative hypothyroidism 08/26/2010  . Gastroesophageal reflux disease 08/26/2010  . Vitamin D deficiency 08/26/2010  . Fibrocystic disease of breast 08/26/2010  . Cobalamin deficiency 08/26/2010  . Obesity 03/20/2010  . Backache 03/20/2010    Scot Jun, PT, DPT, OCS, ATC 01/08/20  9:53 AM    [facility] Physical Therapy [address] [place], [place], [address] Phone: [phone]   Fax:  [phone]  Name: Shawonda Kerce MRN: [id number] Date of Birth: 05-Nov-1960

## 2020-01-08 NOTE — Telephone Encounter (Signed)
-----   Message from Victoria Dom, MD sent at 01/08/2020 10:10 AM EST ----- Please let the patient know that her vaginitis panel was completely negative. If her symptoms persist, please have her return for repeat evaluation.

## 2020-01-08 NOTE — Progress Notes (Signed)
   Covid-19 Vaccination Clinic  Name:  Victoria [name]    MRN: [id number] DOB: 13-Jun-1960  01/08/2020  [name] was observed post Covid-19 immunization for 15 minutes without incident. She was provided with Vaccine Information Sheet and instruction to access the V-Safe system.   [name] was instructed to call 911 with any severe reactions post vaccine: Marland Kitchen Difficulty breathing  . Swelling of face and throat  . A fast heartbeat  . A bad rash all over body  . Dizziness and weakness

## 2020-01-08 NOTE — Telephone Encounter (Signed)
Burnice Logan, RN  01/08/2020 10:56 AM EST Back to Top    Spoke with patient. Advised per Dr. Talbert Nan. Patient is scheduled for nurse visit on 11/22 for repeat UA. See telephone encounter dated 01/08/20 to review additional concerns with provider.    Patient reports she has taken diflucan 150 mg PO x1, vaginal burning has improved. Patient reports increase in white to green, thick vaginal d/c. Denies any additional symptoms. Patient is asking if ok to take second diflucan? Advised testing was negative for yeast, recommended not taking second diflucan, OV recommended for re-evaluation. Patient request to review with provider and return call.

## 2020-01-11 ENCOUNTER — Ambulatory Visit (INDEPENDENT_AMBULATORY_CARE_PROVIDER_SITE_OTHER): Payer: No Typology Code available for payment source | Admitting: Physical Therapy

## 2020-01-11 ENCOUNTER — Ambulatory Visit: Payer: Self-pay | Admitting: Obstetrics and Gynecology

## 2020-01-11 ENCOUNTER — Other Ambulatory Visit: Payer: Self-pay

## 2020-01-11 ENCOUNTER — Encounter: Payer: Self-pay | Admitting: Physical Therapy

## 2020-01-11 DIAGNOSIS — R262 Difficulty in walking, not elsewhere classified: Secondary | ICD-10-CM

## 2020-01-11 DIAGNOSIS — M6281 Muscle weakness (generalized): Secondary | ICD-10-CM

## 2020-01-11 DIAGNOSIS — M25562 Pain in left knee: Secondary | ICD-10-CM

## 2020-01-11 DIAGNOSIS — M25561 Pain in right knee: Secondary | ICD-10-CM

## 2020-01-11 DIAGNOSIS — R600 Localized edema: Secondary | ICD-10-CM

## 2020-01-11 NOTE — Therapy (Signed)
[facility] Physical Therapy [address] [place], [place], [address] Phone: [phone]   Fax:  [phone]  Physical Therapy Treatment  Patient Details  Name: Victoria [name] MRN: [id number] Date of Birth: 10-04-1960 Referring Provider (PT): Jean Rosenthal, MD   Encounter Date: 01/11/2020   PT End of Session - 01/11/20 1101    Visit Number 5    Number of Visits 16    Date for PT Re-Evaluation 04/01/20    PT Start Time 1016    PT Stop Time 1100    PT Time Calculation (min) 44 min    Activity Tolerance Patient limited by pain    Behavior During Therapy [facility] for tasks assessed/performed           Past Medical History:  Diagnosis Date  . Bursitis    right shoulder  . Chronic leukopenia    intermittant since 2010, followed by Dr. Renold Genta now  . Degenerative joint disease of low back 09/02/15    Dr. Maureen Ralphs  . Fibrocystic breast   . GERD (gastroesophageal reflux disease)   . Headache    per pt more stress/tension  . Heart palpitations    SEE EPIC ENCOUTNER , CARDIOLOGY DR. Tressia Miners TURNER 2018; reports on 05-16-17 " i haven't had any bouts of those lately"    . Hemorrhoids   . History of Clostridium difficile infection 12/2010  . History of exercise intolerance    ETT on 04-27-2016-- negative ([facility] treadmill score 7)  . History of Helicobacter pylori infection 2001 and 09/ 2012  . HSV-2 infection    genital  . Hx of adenomatous colonic polyps 10/17/2007  . Hydradenitis    per pt currently treated with Humira injection  . Hypothyroidism, postsurgical 1980  . Medial meniscus tear    right knee  . Osteoarthritis   . Pernicious anemia    b12 def  . PONV (postoperative nausea and vomiting)   . 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 Tendonitis    right shoulder  . Varicose veins   . Vitamin B 12 deficiency   . Vitamin D deficiency     Past  Surgical History:  Procedure Laterality Date  . BREATH TEK H PYLORI N/A 05/03/2014   Procedure: BREATH TEK H PYLORI;  Surgeon: Kaylyn Lim, MD;  [facility];  Service: General;  Laterality: N/A;  . CARDIOVASCULAR STRESS TEST  03/11/2013   dr Tressia Miners turner   Low risk nuclear study w/ a very small anterior perfusion defect that most likely represents breast attenuation artifact, less likely this could represent a true perfusion abnormality in the distribution of diagonal branch of the LAD/  normal LV function and wall motion , ef 66%  . CATARACT EXTRACTION W/ INTRAOCULAR LENS  IMPLANT, BILATERAL  10/2017  . CESAREAN SECTION  1985  . CHOLECYSTECTOMY OPEN  1987  . COLONOSCOPY  02/2017   Dr [name] ; per patient they found 7 polyps with polypectomy; on 5 year track   . FINE NEEDLE ASPIRATION Left 05/22/2017   Procedure: LEFT KNEE ASPIRATION;  Surgeon: Gaynelle Arabian, MD;  [facility];  Service: Orthopedics;  Laterality: Left;  Marland Kitchen GASTRIC ROUX-EN-Y N/A 07/06/2014   Procedure: LAPAROSCOPIC ROUX-EN-Y GASTRIC BYPASS, ENTEROLYSIS OF ADHESIONS WITH UPPER ENDOSCOPY;  Surgeon: Johnathan Hausen, MD;  [facility];  Service: General;  Laterality: N/A;  . HAMMER TOE SURGERY Bilateral 02/17/2008   bilateral foot digit's 2  and 5  . HIATAL HERNIA REPAIR N/A 07/06/2014   Procedure: LAPAROSCOPIC REPAIR OF HIATAL HERNIA;  Surgeon: Johnathan Hausen, MD;  [facility];  Service: General;  Laterality: N/A;  . I & D KNEE WITH POLY EXCHANGE Left 12/11/2019   Procedure: LEFT KNEE POLY-LINER EXCHANGE;  Surgeon: Mcarthur Rossetti, MD;  Location: [place];  Service: Orthopedics;  Laterality: Left;  . KNEE ARTHROSCOPY Right 12/11/2019   Procedure: RIGHT KNEE ARTHROSCOPY WITH PARTIAL MEDIAL MENISCECTOMY;  Surgeon: Mcarthur Rossetti, MD;  Location: [place];  Service: Orthopedics;  Laterality: Right;  . KNEE ARTHROSCOPY WITH MEDIAL MENISECTOMY Left 07/17/2017   Procedure: LEFT KNEE ARTHROSCOPY WITH MEDIAL  LATERAL MENISECTOMY;  Surgeon: Gaynelle Arabian, MD;  [facility];  Service: Orthopedics;  Laterality: Left;  Marland Kitchen MASS EXCISION N/A 10/11/2016   Procedure: EXCISION OF PERIANAL MASS;  Surgeon: [name], MD;  Location: [place];  Service: General;  Laterality: N/A;  . REFRACTIVE SURGERY    . THYROIDECTOMY  1980  . TOTAL ABDOMINAL HYSTERECTOMY  08-25-2002   dr Margaretha Glassing   ovaries retained  . TOTAL HIP ARTHROPLASTY Right 05/21/2012   Procedure: TOTAL HIP ARTHROPLASTY;  Surgeon: Gearlean Alf, MD;  [facility];  Service: Orthopedics;  Laterality: Right;  . TOTAL HIP ARTHROPLASTY Left 01-31-2009  dr Wynelle Link  . TOTAL HIP REVISION Left 05/22/2017   Procedure: Left hip bearing surface and head revision;  Surgeon: Gaynelle Arabian, MD;  [facility];  Service: Orthopedics;  Laterality: Left;  . TOTAL KNEE ARTHROPLASTY Left 01/20/2018   Procedure: LEFT TOTAL KNEE ARTHROPLASTY;  Surgeon: Gaynelle Arabian, MD;  [facility];  Service: Orthopedics;  Laterality: Left;  11min  . TRANSTHORACIC ECHOCARDIOGRAM  05/03/2016   ef 55-60%/  trivial MR/  mild TR  . TUBAL LIGATION      There were no vitals filed for this visit.   Subjective Assessment - 01/11/20 1023    Subjective Pt arriving today reporting, "I feel a little off today". Pt reporting 8/10 pain in bilateral knee reporting the right one is giving her more trouble.    Pertinent History Bilateral THA, L TKA, tendonitis, R shoulder bursitis, DJD back 2017, GERD, heart palpitations, OA, PONV, vit D deficiency, bit B12 deficiency    Diagnostic tests MRI    Patient Stated Goals walk without walker, I want to get back to work    Currently in Pain? Yes    Pain Location Knee    Pain Orientation Right;Left    Pain Descriptors / Indicators Aching;Tightness;Sore    Pain Onset 1 to 4 weeks ago              [facility] PT Assessment - 01/11/20 0001      Assessment   Medical Diagnosis R knee arthroscopy partial medial  meniscectomy, L TKA revision     Referring Provider (PT) Jean Rosenthal, MD    Onset Date/Surgical Date 12/11/19      AROM   Right Knee Extension 4    Left Knee Extension 0      PROM   Right Knee Extension 0    Right Knee Flexion 110    Left Knee Extension 0    Left Knee Flexion 98                         [facility] Adult PT Treatment/Exercise - 01/11/20 0001      Neuro Re-ed    Neuro Re-ed Details  side stepping around mat table both directions with UE support       Knee/Hip Exercises: Aerobic   Recumbent Bike rocking back and forth partial revolutions progressing to full revolutions 6 minutes      Knee/Hip Exercises: Seated   Other Seated Knee/Hip Exercises SLR 2 x 10 bilateral LE's     Sit to Sand 10 reps;without UE support   24 inch chair, eccentric     Knee/Hip Exercises: Supine   Straight Leg Raises Strengthening;2 sets;10 reps   bialteral LE's     Manual Therapy   Manual therapy comments seated MWM flexion, Ir, distraction bilateral knee, extension stretching overpressure holds                       PT Long Term Goals - 01/11/20 1057      PT LONG TERM GOAL #1   Title Pt will be indpedent in her HEP and porgression.    Status On-going      PT LONG TERM GOAL #2   Title Pt will be able to amb without an assistive device on communtiy surfaces.    Status On-going      PT LONG TERM GOAL #3   Title Pt will improve her bilateral LE strength to >/= +4/5 in order to improve fucntional mobility.    Status On-going      PT LONG TERM GOAL #4   Title Pt will be able to perform sit to stand in </= 20 seconds.    Status On-going      PT LONG TERM GOAL #5   Title Pt will be able to navigate 5 steps with rail with step over step pattern.    Status On-going      PT LONG TERM GOAL #6   Title Pt will improve her FOTO score from 67% limitation to </= 52% limitation.    Status On-going                 Plan - 01/11/20 1035    Clinical  Impression Statement Pt arriving today reporting 8/10 pain in bilateral knees. Pt stating the R is worse than the left at times. Pt progressing with LE strengthtening and functional mobility. R PROM knee flexion 110 degrees, L PROM knee flexion 98 degrees. Continue skilled PT to progress toward goals.    Personal Factors and Comorbidities Comorbidity 3+    Comorbidities bilateral hip replacement, L TKA 2019, R shoulder bursitis, tendonitis, DJD back 2017, heart palpations, OA, vit B12-def, vit D def    Examination-Activity Limitations Transfers;Sit;Squat;Stairs;Other;Stand    Examination-Participation Restrictions Community Activity;Other;Laundry;Occupation    Stability/Clinical Decision Making Evolving/Moderate complexity    Rehab Potential Good    PT Frequency 2x / week    PT Duration 8 weeks    PT Treatment/Interventions ADLs/Self Care Home Management;Cryotherapy;Electrical Stimulation;Gait training;Stair training;Functional mobility training;Therapeutic activities;Therapeutic exercise;Balance training;Patient/family education;Manual techniques;Taping;Passive range of motion;DME Instruction;Moist Heat    PT Next Visit Plan Progress mobility of knees, strengthening in [place], CKC, progress static and dynamic balance on non compliant surfaces.    PT Home Exercise Plan Access Code: [email or web address]    Consulted and Agree with Plan of Care Patient           Patient will benefit from skilled therapeutic intervention in order to improve the following deficits and impairments:  Postural dysfunction, Decreased strength, Decreased mobility, Increased edema, Decreased balance, Impaired flexibility, Pain, Decreased activity tolerance, Obesity, Difficulty walking  Visit Diagnosis: Acute pain of  right knee  Acute pain of left knee  Difficulty in walking, not elsewhere classified  Muscle weakness (generalized)  Localized edema     Problem List Patient Active Problem List   Diagnosis Date Noted  .  Status post arthroscopy of right knee 12/11/2019  . Polyethylene wear of left knee joint prosthesis ([place]) 09/09/2019  . Acute medial meniscal tear, right, subsequent encounter 08/27/2019  . History of total left knee replacement 07/16/2019  . Peroneal neuropathy at knee, left 06/25/2019  . Lumbosacral radiculopathy 06/07/2019  . 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 IDA (iron deficiency anemia) 10/16/2018  . OA (osteoarthritis) of knee 01/20/2018  . Acute meniscal tear, medial 07/17/2017  . 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 Insomnia 11/08/2016  . Pernicious anemia 11/08/2016  . Leukopenia 07/09/2016  . Hypocupremia 07/09/2016  . Palpitations 04/12/2016  . 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 Lap gastric bypass May 2016 07/06/2014  . History of Clostridium difficile infection 08/12/2011  . Postoperative hypothyroidism 08/26/2010  . Gastroesophageal reflux disease 08/26/2010  . Vitamin D deficiency 08/26/2010  . Fibrocystic disease of breast 08/26/2010  . Cobalamin deficiency 08/26/2010  . Obesity 03/20/2010  . Backache 03/20/2010    Oretha Caprice, PT, MPT 01/11/2020, 11:01 AM  [facility] Physical Therapy [address] [place], [place], [address] Phone: [phone]   Fax:  [phone]  Name: Victoria [name] MRN: [id number] Date of Birth: Aug 06, 1960

## 2020-01-13 ENCOUNTER — Ambulatory Visit (INDEPENDENT_AMBULATORY_CARE_PROVIDER_SITE_OTHER): Payer: No Typology Code available for payment source | Admitting: Physical Therapy

## 2020-01-13 ENCOUNTER — Encounter: Payer: Self-pay | Admitting: Physical Therapy

## 2020-01-13 ENCOUNTER — Other Ambulatory Visit: Payer: Self-pay

## 2020-01-13 DIAGNOSIS — R262 Difficulty in walking, not elsewhere classified: Secondary | ICD-10-CM

## 2020-01-13 DIAGNOSIS — M6281 Muscle weakness (generalized): Secondary | ICD-10-CM

## 2020-01-13 DIAGNOSIS — M25561 Pain in right knee: Secondary | ICD-10-CM

## 2020-01-13 DIAGNOSIS — R600 Localized edema: Secondary | ICD-10-CM

## 2020-01-13 DIAGNOSIS — M25562 Pain in left knee: Secondary | ICD-10-CM

## 2020-01-13 NOTE — Therapy (Signed)
[facility] [address] [place], [place], [address] Phone: [phone]   Fax:  [phone]  Physical Therapy Treatment  Patient Details  Name: Victoria [name] MRN: [id number] Date of Birth: 07-07-1960 Referring Provider (PT): Jean Rosenthal, MD   Encounter Date: 01/13/2020   PT End of Session - 01/13/20 1028    Visit Number 6    Number of Visits 16    Date for PT Re-Evaluation 04/01/20    PT Start Time 1017    PT Stop Time 1058    PT Time Calculation (min) 41 min    Activity Tolerance Patient limited by pain    Behavior During Therapy [facility] for tasks assessed/performed           Past Medical History:  Diagnosis Date  . Bursitis    right shoulder  . Chronic leukopenia    intermittant since 2010, followed by Dr. Renold Genta now  . Degenerative joint disease of low back 09/02/15    Dr. Maureen Ralphs  . Fibrocystic breast   . GERD (gastroesophageal reflux disease)   . Headache    per pt more stress/tension  . Heart palpitations    SEE EPIC ENCOUTNER , CARDIOLOGY DR. Tressia Miners TURNER 2018; reports on 05-16-17 " i haven't had any bouts of those lately"    . Hemorrhoids   . History of Clostridium difficile infection 12/2010  . History of exercise intolerance    ETT on 04-27-2016-- negative ([facility] treadmill score 7)  . History of Helicobacter pylori infection 2001 and 09/ 2012  . HSV-2 infection    genital  . Hx of adenomatous colonic polyps 10/17/2007  . Hydradenitis    per pt currently treated with Humira injection  . Hypothyroidism, postsurgical 1980  . Medial meniscus tear    right knee  . Osteoarthritis   . Pernicious anemia    b12 def  . PONV (postoperative nausea and vomiting)   . 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 Tendonitis    right shoulder  . Varicose veins   . Vitamin B 12 deficiency   . Vitamin D deficiency     Past  Surgical History:  Procedure Laterality Date  . BREATH TEK H PYLORI N/A 05/03/2014   Procedure: BREATH TEK H PYLORI;  Surgeon: Kaylyn Lim, MD;  Location: [facility];  Service: General;  Laterality: N/A;  . CARDIOVASCULAR STRESS TEST  03/11/2013   dr Tressia Miners turner   Low risk nuclear study w/ a very small anterior perfusion defect that most likely represents breast attenuation artifact, less likely this could represent a true perfusion abnormality in the distribution of diagonal branch of the LAD/  normal LV function and wall motion , ef 66%  . CATARACT EXTRACTION W/ INTRAOCULAR LENS  IMPLANT, BILATERAL  10/2017  . CESAREAN SECTION  1985  . CHOLECYSTECTOMY OPEN  1987  . COLONOSCOPY  02/2017   Dr [name] ; per patient they found 7 polyps with polypectomy; on 5 year track   . FINE NEEDLE ASPIRATION Left 05/22/2017   Procedure: LEFT KNEE ASPIRATION;  Surgeon: Gaynelle Arabian, MD;  Location: [facility];  Service: Orthopedics;  Laterality: Left;  Marland Kitchen GASTRIC ROUX-EN-Y N/A 07/06/2014   Procedure: LAPAROSCOPIC ROUX-EN-Y GASTRIC BYPASS, ENTEROLYSIS OF ADHESIONS WITH UPPER ENDOSCOPY;  Surgeon: Johnathan Hausen, MD;  Location: [facility];  Service: General;  Laterality: N/A;  . HAMMER TOE SURGERY Bilateral 02/17/2008   bilateral foot digit's 2  and 5  . HIATAL HERNIA REPAIR N/A 07/06/2014   Procedure: LAPAROSCOPIC REPAIR OF HIATAL HERNIA;  Surgeon: Johnathan Hausen, MD;  Location: [facility];  Service: General;  Laterality: N/A;  . I & D KNEE WITH POLY EXCHANGE Left 12/11/2019   Procedure: LEFT KNEE POLY-LINER EXCHANGE;  Surgeon: Mcarthur Rossetti, MD;  Location: [place];  Service: Orthopedics;  Laterality: Left;  . KNEE ARTHROSCOPY Right 12/11/2019   Procedure: RIGHT KNEE ARTHROSCOPY WITH PARTIAL MEDIAL MENISCECTOMY;  Surgeon: Mcarthur Rossetti, MD;  Location: [place];  Service: Orthopedics;  Laterality: Right;  . KNEE ARTHROSCOPY WITH MEDIAL MENISECTOMY Left 07/17/2017   Procedure: LEFT KNEE ARTHROSCOPY WITH MEDIAL  LATERAL MENISECTOMY;  Surgeon: Gaynelle Arabian, MD;  Location: [facility];  Service: Orthopedics;  Laterality: Left;  Marland Kitchen MASS EXCISION N/A 10/11/2016   Procedure: EXCISION OF PERIANAL MASS;  Surgeon: [name], MD;  Location: [place];  Service: General;  Laterality: N/A;  . REFRACTIVE SURGERY    . THYROIDECTOMY  1980  . TOTAL ABDOMINAL HYSTERECTOMY  08-25-2002   dr Margaretha Glassing   ovaries retained  . TOTAL HIP ARTHROPLASTY Right 05/21/2012   Procedure: TOTAL HIP ARTHROPLASTY;  Surgeon: Gearlean Alf, MD;  Location: [facility];  Service: Orthopedics;  Laterality: Right;  . TOTAL HIP ARTHROPLASTY Left 01-31-2009  dr Wynelle Link  . TOTAL HIP REVISION Left 05/22/2017   Procedure: Left hip bearing surface and head revision;  Surgeon: Gaynelle Arabian, MD;  Location: [facility];  Service: Orthopedics;  Laterality: Left;  . TOTAL KNEE ARTHROPLASTY Left 01/20/2018   Procedure: LEFT TOTAL KNEE ARTHROPLASTY;  Surgeon: Gaynelle Arabian, MD;  Location: [facility];  Service: Orthopedics;  Laterality: Left;  34min  . TRANSTHORACIC ECHOCARDIOGRAM  05/03/2016   ef 55-60%/  trivial MR/  mild TR  . TUBAL LIGATION      There were no vitals filed for this visit.   Subjective Assessment - 01/13/20 1022    Subjective Pt arriving today reporting 8/10 pain in her Left knee which is extending up to her thigh.    Pertinent History Bilateral THA, L TKA, tendonitis, R shoulder bursitis, DJD back 2017, GERD, heart palpitations, OA, PONV, vit D deficiency, bit B12 deficiency    Diagnostic tests MRI    Patient Stated Goals walk without walker, I want to get back to work    Currently in Pain? Yes    Pain Orientation Left;Right    Pain Descriptors / Indicators Aching;Tightness;Sore    Pain Type Chronic pain    Pain Frequency Constant                             [facility] Adult PT Treatment/Exercise - 01/13/20 0001      Ambulation/Gait   Gait Comments Amb with RW around gym, instructions on heel to toe  gait pattern      Knee/Hip Exercises: Aerobic   Recumbent Bike rocking back and forth partial revolutions progressing to full revolutions 6 minutes      Knee/Hip Exercises: Seated   Sit to Sand 2 sets;10 reps;with UE support   24 inch chair, eccentric     Knee/Hip Exercises: Supine   Bridges Strengthening;Both;15 reps    Straight Leg Raises Strengthening;2 sets;10 reps   bialteral LE's                      PT Long Term Goals - 01/11/20 1057  PT LONG TERM GOAL #1   Title Pt will be indpedent in her HEP and porgression.    Status On-going      PT LONG TERM GOAL #2   Title Pt will be able to amb without an assistive device on communtiy surfaces.    Status On-going      PT LONG TERM GOAL #3   Title Pt will improve her bilateral LE strength to >/= +4/5 in order to improve fucntional mobility.    Status On-going      PT LONG TERM GOAL #4   Title Pt will be able to perform sit to stand in </= 20 seconds.    Status On-going      PT LONG TERM GOAL #5   Title Pt will be able to navigate 5 steps with rail with step over step pattern.    Status On-going      PT LONG TERM GOAL #6   Title Pt will improve her FOTO score from 67% limitation to </= 52% limitation.    Status On-going                 Plan - 01/13/20 1033    Clinical Impression Statement Pt arriving today reporting 8/10 pain in left knee. Pt reporting difficulty with endurance when performing ADL's. Pt stating she wants to be able to cook without fatigue and pain. Pt is making progress with strengthening. Continue skilled PT progressing toward LTG's.    Personal Factors and Comorbidities Comorbidity 3+    Comorbidities bilateral hip replacement, L TKA 2019, R shoulder bursitis, tendonitis, DJD back 2017, heart palpations, OA, vit B12-def, vit D def    Examination-Activity Limitations Transfers;Sit;Squat;Stairs;Other;Stand    Examination-Participation Restrictions Community  Activity;Other;Laundry;Occupation    Stability/Clinical Decision Making Evolving/Moderate complexity    Rehab Potential Good    PT Frequency 2x / week    PT Duration 8 weeks    PT Treatment/Interventions ADLs/Self Care Home Management;Cryotherapy;Electrical Stimulation;Gait training;Stair training;Functional mobility training;Therapeutic activities;Therapeutic exercise;Balance training;Patient/family education;Manual techniques;Taping;Passive range of motion;DME Instruction;Moist Heat    PT Next Visit Plan Progress mobility of knees, strengthening in [place], [place], progress static and dynamic balance on non compliant surfaces.    PT Home Exercise Plan Access Code: [email or web address]    Consulted and Agree with Plan of Care Patient           Patient will benefit from skilled therapeutic intervention in order to improve the following deficits and impairments:  Postural dysfunction, Decreased strength, Decreased mobility, Increased edema, Decreased balance, Impaired flexibility, Pain, Decreased activity tolerance, Obesity, Difficulty walking  Visit Diagnosis: Acute pain of right knee  Acute pain of left knee  Difficulty in walking, not elsewhere classified  Muscle weakness (generalized)  Localized edema     Problem List Patient Active Problem List   Diagnosis Date Noted  . Status post arthroscopy of right knee 12/11/2019  . 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 Acute medial meniscal tear, right, subsequent encounter 08/27/2019  . History of total left knee replacement 07/16/2019  . Peroneal neuropathy at knee, left 06/25/2019  . Lumbosacral radiculopathy 06/07/2019  . 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 IDA (iron deficiency anemia) 10/16/2018  . OA (osteoarthritis) of knee 01/20/2018  . Acute meniscal tear, medial 07/17/2017  . 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 Insomnia 11/08/2016  . Pernicious  anemia 11/08/2016  . Leukopenia 07/09/2016  . Hypocupremia 07/09/2016  . Palpitations  04/12/2016  . 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 Lap gastric bypass May 2016 07/06/2014  . History of Clostridium difficile infection 08/12/2011  . Postoperative hypothyroidism 08/26/2010  . Gastroesophageal reflux disease 08/26/2010  . Vitamin D deficiency 08/26/2010  . Fibrocystic disease of breast 08/26/2010  . Cobalamin deficiency 08/26/2010  . Obesity 03/20/2010  . Backache 03/20/2010    Oretha Caprice, PT, MPT 01/13/2020, 11:01 AM  [facility] [address] [place], [place], [address] Phone: [phone]   Fax:  [phone]  Name: Victoria [name] MRN: [id number] Date of Birth: January 21, 1961

## 2020-01-14 ENCOUNTER — Ambulatory Visit (INDEPENDENT_AMBULATORY_CARE_PROVIDER_SITE_OTHER): Payer: No Typology Code available for payment source | Admitting: Obstetrics and Gynecology

## 2020-01-14 ENCOUNTER — Encounter: Payer: Self-pay | Admitting: Obstetrics and Gynecology

## 2020-01-14 ENCOUNTER — Other Ambulatory Visit: Payer: Self-pay | Admitting: Obstetrics and Gynecology

## 2020-01-14 ENCOUNTER — Telehealth: Payer: Self-pay

## 2020-01-14 VITALS — BP 110/74 | HR 70 | Resp 16 | Wt 274.0 lb

## 2020-01-14 DIAGNOSIS — N761 Subacute and chronic vaginitis: Secondary | ICD-10-CM | POA: Diagnosis not present

## 2020-01-14 DIAGNOSIS — N760 Acute vaginitis: Secondary | ICD-10-CM

## 2020-01-14 DIAGNOSIS — R3129 Other microscopic hematuria: Secondary | ICD-10-CM | POA: Diagnosis not present

## 2020-01-14 MED ORDER — HYDROCORTISONE ACETATE 25 MG RE SUPP: RECTAL | 0 refills | Status: DC

## 2020-01-14 MED FILL — HYDROCORTISONE AC 25 MG SUP: 25 | 30 days supply | Qty: 30 | Fill #0

## 2020-01-14 NOTE — Telephone Encounter (Signed)
Spoke with pt. Pt needing 6 week recheck per Dr Talbert Nan. Pt scheduled with Dr Talbert Nan on 12/28 at 1130 am. Pt agreeable to date and time of appt. Encounter closed

## 2020-01-14 NOTE — Telephone Encounter (Signed)
Patient need a six week recheck with Dr Talbert Nan. Sending to triage to assist with scheduling.

## 2020-01-14 NOTE — Progress Notes (Signed)
GYNECOLOGY  VISIT   HPI: 59 y.o.   Married Black or [place] American Not Hispanic or Latino  female   [phone] with No LMP recorded. Patient has had a hysterectomy.   here for greenish tint discharge & vaginal itching.  The patient was seen last week with thick, clumpy vaginal d/c with associated burning and itching. She has recently restarted ERT (off for knee surgery), h/o hysterectomy. Her exam last week was c/w yeast, but no yeast was seen on vaginal slides and her nuswab vaginitis panel was negative.  Not sexually active, husband s/p prostatectomy.   Since last week she continues to have a thick white d/c and some green d/c, clumping. Mild itching. The burning has subsided.  She hasn't started her estring yet, waiting to get this vaginal d/c cleared up.   GYNECOLOGIC HISTORY: No LMP recorded. Patient has had a hysterectomy. Contraception: hysterectomy Menopausal hormone therapy: none        OB History    Gravida  2   Para  1   Term      Preterm  1   AB  1   Living  2     SAB      TAB  1   Ectopic      Multiple  1   Live Births  2              Patient Active Problem List   Diagnosis Date Noted  . Status post arthroscopy of right knee 12/11/2019  . 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 Acute medial meniscal tear, right, subsequent encounter 08/27/2019  . History of total left knee replacement 07/16/2019  . Peroneal neuropathy at knee, left 06/25/2019  . Lumbosacral radiculopathy 06/07/2019  . 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 IDA (iron deficiency anemia) 10/16/2018  . OA (osteoarthritis) of knee 01/20/2018  . Acute meniscal tear, medial 07/17/2017  . 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 Insomnia 11/08/2016  . Pernicious anemia 11/08/2016  . Leukopenia 07/09/2016  . Hypocupremia 07/09/2016  . Palpitations 04/12/2016  . 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 Lap gastric bypass May 2016 07/06/2014  . History of Clostridium difficile infection 08/12/2011  . Postoperative hypothyroidism 08/26/2010  . Gastroesophageal reflux disease 08/26/2010  . Vitamin D deficiency 08/26/2010  . Fibrocystic disease of breast 08/26/2010  . Cobalamin deficiency 08/26/2010  . Obesity 03/20/2010  . Backache 03/20/2010    Past Medical History:  Diagnosis Date  . Bursitis    right shoulder  . Chronic leukopenia    intermittant since 2010, followed by Dr. Renold Genta now  . Degenerative joint disease of low back 09/02/15    Dr. Maureen Ralphs  . Fibrocystic breast   . GERD (gastroesophageal reflux disease)   . Headache    per pt more stress/tension  . Heart palpitations    SEE EPIC ENCOUTNER , CARDIOLOGY DR. Tressia Miners TURNER 2018; reports on 05-16-17 " i haven't had any bouts of those lately"    . Hemorrhoids   . History of Clostridium difficile infection 12/2010  . History of exercise intolerance    ETT on 04-27-2016-- negative ([facility] treadmill score 7)  . History of Helicobacter pylori infection 2001 and 09/ 2012  . HSV-2 infection    genital  . Hx of adenomatous colonic polyps 10/17/2007  . Hydradenitis    per pt currently treated with Humira injection  . Hypothyroidism,  postsurgical 1980  . Medial meniscus tear    right knee  . Osteoarthritis   . Pernicious anemia    b12 def  . PONV (postoperative nausea and vomiting)   . 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 Tendonitis    right shoulder  . Varicose veins   . Vitamin B 12 deficiency   . Vitamin D deficiency     Past Surgical History:  Procedure Laterality Date  . BREATH TEK H PYLORI N/A 05/03/2014   Procedure: BREATH TEK H PYLORI;  Surgeon: Kaylyn Lim, MD;  Location: [facility];  Service: General;  Laterality: N/A;  . CARDIOVASCULAR STRESS TEST  03/11/2013   dr Tressia Miners turner    Low risk nuclear study w/ a very small anterior perfusion defect that most likely represents breast attenuation artifact, less likely this could represent a true perfusion abnormality in the distribution of diagonal branch of the LAD/  normal LV function and wall motion , ef 66%  . CATARACT EXTRACTION W/ INTRAOCULAR LENS  IMPLANT, BILATERAL  10/2017  . CESAREAN SECTION  1985  . CHOLECYSTECTOMY OPEN  1987  . COLONOSCOPY  02/2017   Dr [name] ; per patient they found 7 polyps with polypectomy; on 5 year track   . FINE NEEDLE ASPIRATION Left 05/22/2017   Procedure: LEFT KNEE ASPIRATION;  Surgeon: Gaynelle Arabian, MD;  Location: [facility];  Service: Orthopedics;  Laterality: Left;  Marland Kitchen GASTRIC ROUX-EN-Y N/A 07/06/2014   Procedure: LAPAROSCOPIC ROUX-EN-Y GASTRIC BYPASS, ENTEROLYSIS OF ADHESIONS WITH UPPER ENDOSCOPY;  Surgeon: Johnathan Hausen, MD;  Location: [facility];  Service: General;  Laterality: N/A;  . HAMMER TOE SURGERY Bilateral 02/17/2008   bilateral foot digit's 2 and 5  . HIATAL HERNIA REPAIR N/A 07/06/2014   Procedure: LAPAROSCOPIC REPAIR OF HIATAL HERNIA;  Surgeon: Johnathan Hausen, MD;  Location: [facility];  Service: General;  Laterality: N/A;  . I & D KNEE WITH POLY EXCHANGE Left 12/11/2019   Procedure: LEFT KNEE POLY-LINER EXCHANGE;  Surgeon: Mcarthur Rossetti, MD;  Location: [facility];  Service: Orthopedics;  Laterality: Left;  . KNEE ARTHROSCOPY Right 12/11/2019   Procedure: RIGHT KNEE ARTHROSCOPY WITH PARTIAL MEDIAL MENISCECTOMY;  Surgeon: Mcarthur Rossetti, MD;  Location: [facility];  Service: Orthopedics;  Laterality: Right;  . KNEE ARTHROSCOPY WITH MEDIAL MENISECTOMY Left 07/17/2017   Procedure: LEFT KNEE ARTHROSCOPY WITH MEDIAL LATERAL MENISECTOMY;  Surgeon: Gaynelle Arabian, MD;  Location: [facility];  Service: Orthopedics;  Laterality: Left;  Marland Kitchen MASS EXCISION N/A 10/11/2016   Procedure: EXCISION OF PERIANAL MASS;  Surgeon: [name], MD;  Location: [facility];  Service: General;   Laterality: N/A;  . REFRACTIVE SURGERY    . THYROIDECTOMY  1980  . TOTAL ABDOMINAL HYSTERECTOMY  08-25-2002   dr Margaretha Glassing   ovaries retained  . TOTAL HIP ARTHROPLASTY Right 05/21/2012   Procedure: TOTAL HIP ARTHROPLASTY;  Surgeon: Gearlean Alf, MD;  Location: [facility];  Service: Orthopedics;  Laterality: Right;  . TOTAL HIP ARTHROPLASTY Left 01-31-2009  dr Wynelle Link  . TOTAL HIP REVISION Left 05/22/2017   Procedure: Left hip bearing surface and head revision;  Surgeon: Gaynelle Arabian, MD;  Location: [facility];  Service: Orthopedics;  Laterality: Left;  . TOTAL KNEE ARTHROPLASTY Left 01/20/2018   Procedure: LEFT TOTAL KNEE ARTHROPLASTY;  Surgeon: Gaynelle Arabian, MD;  Location: [facility];  Service: Orthopedics;  Laterality: Left;  59min  . TRANSTHORACIC ECHOCARDIOGRAM  05/03/2016   ef 55-60%/  trivial MR/  mild TR  . TUBAL LIGATION      Current Outpatient Medications  Medication Sig Dispense Refill  . Adalimumab (HUMIRA PEN) 40 MG/0.8ML PNKT Inject one pen under the skin each week. 4 each 5  . ALPRAZolam (XANAX) 1 MG tablet TAKE 1 TABLET BY MOUTH EVERY NIGHT AT BEDTIME (Patient taking differently: Take 1 mg by mouth at bedtime as needed for sleep. ) 90 tablet 0  . Calcium Carb-Cholecalciferol (CALCIUM + D3 PO) Take 1 tablet by mouth daily.    . celecoxib (CELEBREX) 200 MG capsule Take 1 capsule (200 mg total) by mouth 2 (two) times daily between meals as needed. 60 capsule 1  . cyanocobalamin (,VITAMIN B-12,) 1000 MCG/ML injection INJECT 1 ML INTRAMUSCULARLY EVERY MONTH (Patient taking differently: Inject 1,000 mcg into the muscle every 30 (thirty) days. INJECT 1 ML INTRAMUSCULARLY EVERY MONTH) 3 mL 11  . DIVIGEL 1 MG/GM GEL APPLY TO THE SKIN AT BEDTIME (Patient taking differently: Apply 1 application topically at bedtime. ) 90 g 4  . gabapentin (NEURONTIN) 600 MG tablet TAKE 1 TABLET BY MOUTH 3 TIMES DAILY. 90 tablet 2  . HYDROmorphone (DILAUDID) 4 MG tablet Take 1 tablet (4 mg total) by mouth  every 6 (six) hours as needed for severe pain. 30 tablet 0  . levothyroxine (SYNTHROID) 112 MCG tablet TAKE 1 TABLET BY MOUTH ONCE A DAY (Patient taking differently: Take 112 mcg by mouth daily at 2 am. During nighttime bathroom break) 90 tablet 2  . Multiple Vitamin (MULTIVITAMIN WITH MINERALS) TABS tablet Take 1 tablet by mouth daily.    . pantoprazole (PROTONIX) 40 MG tablet TAKE 1 TABLET BY MOUTH TWICE DAILY--- PRN (Patient taking differently: Take 40 mg by mouth 2 (two) times daily as needed (indigestion/acid reflux.). )    . polyethylene glycol (MIRALAX / GLYCOLAX) packet Take 17 g by mouth daily as needed for mild constipation.    . promethazine (PHENERGAN) 25 MG tablet TAKE 1 TABLET BY MOUTH EVERY 8 HOURS AS NEEDED FOR NAUSEA AND VOMITING (Patient taking differently: Take 25 mg by mouth every 8 (eight) hours as needed for nausea or vomiting. ) 30 tablet 5  . Rimegepant Sulfate (NURTEC) 75 MG TBDP Take 75 mg by mouth daily as needed. For migraines. Take as close to onset of migraine as possible. One daily maximum. (Patient taking differently: Take 75 mg by mouth daily as needed (onset of migraine (Max 1 tablet/24hr)). For migraines. Take as close to onset of migraine as possible. One daily maximum.) 10 tablet 11  . tiZANidine (ZANAFLEX) 4 MG tablet Take 1 tablet (4 mg total) by mouth every 8 (eight) hours as needed for muscle spasms. 40 tablet 1  . traZODone (DESYREL) 50 MG tablet Take 1 tablet (50 mg total) by mouth at bedtime. (Patient taking differently: Take 50 mg by mouth at bedtime as needed for sleep. ) 30 tablet 1  . valACYclovir (VALTREX) 500 MG tablet TAKE 1 TABLET BY MOUTH ONCE DAILY (Patient taking differently: Take 500 mg by mouth daily as needed (outbreaks). ) 90 tablet 3  . betamethasone valerate ointment (VALISONE) 0.1 % Apply 1 application topically 2 (two) times daily. Can use for up to 2 weeks as needed (Patient not taking: Reported on 01/14/2020) 15 g 0  . estradiol (ESTRING) 2  MG vaginal ring Place 2 mg vaginally every 3 (three) months. follow package directions (Patient not taking: Reported on 01/14/2020) 1 each 3  No current facility-administered medications for this visit.   Facility-Administered Medications Ordered in Other Visits  Medication Dose Route Frequency Provider Last Rate Last Admin  . gadobenate dimeglumine (MULTIHANCE) injection 20 mL  20 mL Intravenous Once PRN Melvenia Beam, MD         ALLERGIES: Other and Estradiol  Family History  Problem Relation Age of Onset  . Diabetes Father   . Diabetes Sister   . Hypertension Sister   . Hypertension Mother   . Headache Mother     Social History   Socioeconomic History  . Marital status: Married    Spouse name: Not on file  . Number of children: 2  . Years of education: Not on file  . Highest education level: Not on file  Occupational History  . Occupation: nurse  Tobacco Use  . Smoking status: Never Smoker  . Smokeless tobacco: Never Used  Vaping Use  . Vaping Use: Never used  Substance and Sexual Activity  . Alcohol use: No  . Drug use: No  . Sexual activity: Not Currently    Partners: Male    Birth control/protection: Surgical    Comment: hysterectomy  Other Topics Concern  . Not on file  Social History Narrative   Lives at home with spouse   Right handed   Caffeine: diet zero mtn dew, occasionally    Social Determinants of Health   Financial Resource Strain:   . Difficulty of Paying Living Expenses: Not on file  Food Insecurity:   . Worried About Charity fundraiser in the Last Year: Not on file  . Ran Out of Food in the Last Year: Not on file  Transportation Needs:   . Lack of Transportation (Medical): Not on file  . Lack of Transportation (Non-Medical): Not on file  Physical Activity:   . Days of Exercise per Week: Not on file  . Minutes of Exercise per Session: Not on file  Stress:   . Feeling of Stress : Not on file  Social Connections:   . Frequency of  Communication with Friends and Family: Not on file  . Frequency of Social Gatherings with Friends and Family: Not on file  . Attends Religious Services: Not on file  . Active Member of Clubs or Organizations: Not on file  . Attends Archivist Meetings: Not on file  . Marital Status: Not on file  Intimate Partner Violence:   . Fear of Current or Ex-Partner: Not on file  . Emotionally Abused: Not on file  . Physically Abused: Not on file  . Sexually Abused: Not on file    Review of Systems  Constitutional: Negative.   HENT: Negative.   Eyes: Negative.   Respiratory: Negative.   Cardiovascular: Negative.   Gastrointestinal: Negative.   Genitourinary:       Greenish tint discharge & vaginal itching  Musculoskeletal: Negative.   Skin: Negative.   Neurological: Negative.   Endo/Heme/Allergies: Negative.   Psychiatric/Behavioral: Negative.     PHYSICAL EXAMINATION:    BP 110/74   Pulse 70   Resp 16   Wt 274 lb (124.3 kg)   BMI 42.28 kg/m     General appearance: alert, cooperative and appears stated age   Pelvic: External genitalia:  no lesions, mild erythema              Urethra:  normal appearing urethra with no masses, tenderness or lesions  Bartholins and Skenes: normal                 Vagina: erythematous appearing vagina with now a predominantly watery gray vaginal d/c, few clumpy areas of discharge.            Cervix: absent               Chaperone was present for exam.  Wet prep: no clue, no trich, +++++ wbc, ratio of WBC to epithelial cells is much greater than 2:1 KOH: no yeast PH: 4.5-5   ASSESSMENT Vulvovaginitis, discharge has changed, picture c/w desquamative inflammatory vaginitis  Prior urine dip + blood, this happened in September and the patient returned for a st cath ua which was negative for blood.     PLAN Will send nuswab for vaginitis and std testing  Will treat with hydrocortisone 25 mg suppositories daily for one  month F/u in 6 weeks Will repeat her urine in 6 weeks.

## 2020-01-17 LAB — NUSWAB VAGINITIS PLUS (VG+)
Candida albicans, NAA: NEGATIVE
Candida glabrata, NAA: NEGATIVE
Chlamydia trachomatis, NAA: NEGATIVE
Neisseria gonorrhoeae, NAA: NEGATIVE
Trich vag by NAA: NEGATIVE

## 2020-01-18 ENCOUNTER — Other Ambulatory Visit: Payer: Self-pay

## 2020-01-18 ENCOUNTER — Encounter: Payer: Self-pay | Admitting: Physical Therapy

## 2020-01-18 ENCOUNTER — Ambulatory Visit: Payer: No Typology Code available for payment source

## 2020-01-18 ENCOUNTER — Ambulatory Visit (INDEPENDENT_AMBULATORY_CARE_PROVIDER_SITE_OTHER): Payer: No Typology Code available for payment source | Admitting: Physical Therapy

## 2020-01-18 DIAGNOSIS — M25561 Pain in right knee: Secondary | ICD-10-CM | POA: Diagnosis not present

## 2020-01-18 DIAGNOSIS — M6281 Muscle weakness (generalized): Secondary | ICD-10-CM

## 2020-01-18 DIAGNOSIS — R600 Localized edema: Secondary | ICD-10-CM

## 2020-01-18 DIAGNOSIS — R262 Difficulty in walking, not elsewhere classified: Secondary | ICD-10-CM

## 2020-01-18 DIAGNOSIS — M25562 Pain in left knee: Secondary | ICD-10-CM

## 2020-01-18 NOTE — Therapy (Signed)
[facility] Physical Therapy [address] [place], [place], [address] Phone: [phone]   Fax:  [phone]  Physical Therapy Treatment  Patient Details  Name: Victoria [name] MRN: [id number] Date of Birth: Sep 25, 1960 Referring Provider (PT): Jean Rosenthal, MD   Encounter Date: 01/18/2020   PT End of Session - 01/18/20 0944    Visit Number 7    Number of Visits 16    Date for PT Re-Evaluation 04/01/20    PT Start Time 0930    PT Stop Time 1012    PT Time Calculation (min) 42 min    Activity Tolerance Patient limited by pain    Behavior During Therapy [facility] for tasks assessed/performed           Past Medical History:  Diagnosis Date  . Bursitis    right shoulder  . Chronic leukopenia    intermittant since 2010, followed by Dr. Renold Genta now  . Degenerative joint disease of low back 09/02/15    Dr. Maureen Ralphs  . Fibrocystic breast   . GERD (gastroesophageal reflux disease)   . Headache    per pt more stress/tension  . Heart palpitations    SEE EPIC ENCOUTNER , CARDIOLOGY DR. Tressia Miners TURNER 2018; reports on 05-16-17 " i haven't had any bouts of those lately"    . Hemorrhoids   . History of Clostridium difficile infection 12/2010  . History of exercise intolerance    ETT on 04-27-2016-- negative ([facility] treadmill score 7)  . History of Helicobacter pylori infection 2001 and 09/ 2012  . HSV-2 infection    genital  . Hx of adenomatous colonic polyps 10/17/2007  . Hydradenitis    per pt currently treated with Humira injection  . Hypothyroidism, postsurgical 1980  . Medial meniscus tear    right knee  . Osteoarthritis   . Pernicious anemia    b12 def  . PONV (postoperative nausea and vomiting)   . 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 Tendonitis    right shoulder  . Varicose veins   . Vitamin B 12 deficiency   . Vitamin D deficiency     Past  Surgical History:  Procedure Laterality Date  . BREATH TEK H PYLORI N/A 05/03/2014   Procedure: BREATH TEK H PYLORI;  Surgeon: Kaylyn Lim, MD;  [facility];  Service: General;  Laterality: N/A;  . CARDIOVASCULAR STRESS TEST  03/11/2013   dr Tressia Miners turner   Low risk nuclear study w/ a very small anterior perfusion defect that most likely represents breast attenuation artifact, less likely this could represent a true perfusion abnormality in the distribution of diagonal branch of the LAD/  normal LV function and wall motion , ef 66%  . CATARACT EXTRACTION W/ INTRAOCULAR LENS  IMPLANT, BILATERAL  10/2017  . CESAREAN SECTION  1985  . CHOLECYSTECTOMY OPEN  1987  . COLONOSCOPY  02/2017   Dr [name] ; per patient they found 7 polyps with polypectomy; on 5 year track   . FINE NEEDLE ASPIRATION Left 05/22/2017   Procedure: LEFT KNEE ASPIRATION;  Surgeon: Gaynelle Arabian, MD;  [facility];  Service: Orthopedics;  Laterality: Left;  Marland Kitchen GASTRIC ROUX-EN-Y N/A 07/06/2014   Procedure: LAPAROSCOPIC ROUX-EN-Y GASTRIC BYPASS, ENTEROLYSIS OF ADHESIONS WITH UPPER ENDOSCOPY;  Surgeon: Johnathan Hausen, MD;  [facility];  Service: General;  Laterality: N/A;  . HAMMER TOE SURGERY Bilateral 02/17/2008   bilateral foot digit's 2  and 5  . HIATAL HERNIA REPAIR N/A 07/06/2014   Procedure: LAPAROSCOPIC REPAIR OF HIATAL HERNIA;  Surgeon: Johnathan Hausen, MD;  [facility];  Service: General;  Laterality: N/A;  . I & D KNEE WITH POLY EXCHANGE Left 12/11/2019   Procedure: LEFT KNEE POLY-LINER EXCHANGE;  Surgeon: Mcarthur Rossetti, MD;  Location: [place];  Service: Orthopedics;  Laterality: Left;  . KNEE ARTHROSCOPY Right 12/11/2019   Procedure: RIGHT KNEE ARTHROSCOPY WITH PARTIAL MEDIAL MENISCECTOMY;  Surgeon: Mcarthur Rossetti, MD;  Location: [place];  Service: Orthopedics;  Laterality: Right;  . KNEE ARTHROSCOPY WITH MEDIAL MENISECTOMY Left 07/17/2017   Procedure: LEFT KNEE ARTHROSCOPY WITH MEDIAL  LATERAL MENISECTOMY;  Surgeon: Gaynelle Arabian, MD;  [facility];  Service: Orthopedics;  Laterality: Left;  Marland Kitchen MASS EXCISION N/A 10/11/2016   Procedure: EXCISION OF PERIANAL MASS;  Surgeon: [name], MD;  Location: [place];  Service: General;  Laterality: N/A;  . REFRACTIVE SURGERY    . THYROIDECTOMY  1980  . TOTAL ABDOMINAL HYSTERECTOMY  08-25-2002   dr Margaretha Glassing   ovaries retained  . TOTAL HIP ARTHROPLASTY Right 05/21/2012   Procedure: TOTAL HIP ARTHROPLASTY;  Surgeon: Gearlean Alf, MD;  [facility];  Service: Orthopedics;  Laterality: Right;  . TOTAL HIP ARTHROPLASTY Left 01-31-2009  dr Wynelle Link  . TOTAL HIP REVISION Left 05/22/2017   Procedure: Left hip bearing surface and head revision;  Surgeon: Gaynelle Arabian, MD;  [facility];  Service: Orthopedics;  Laterality: Left;  . TOTAL KNEE ARTHROPLASTY Left 01/20/2018   Procedure: LEFT TOTAL KNEE ARTHROPLASTY;  Surgeon: Gaynelle Arabian, MD;  [facility];  Service: Orthopedics;  Laterality: Left;  63mn  . TRANSTHORACIC ECHOCARDIOGRAM  05/03/2016   ef 55-60%/  trivial MR/  mild TR  . TUBAL LIGATION      There were no vitals filed for this visit.   Subjective Assessment - 01/18/20 0942    Subjective Pt arriving today reporting shooting pain in left knee which is 7/10 and R knee is 5/10.    Pertinent History Bilateral THA, L TKA, tendonitis, R shoulder bursitis, DJD back 2017, GERD, heart palpitations, OA, PONV, vit D deficiency, bit B12 deficiency    Diagnostic tests MRI    Patient Stated Goals walk without walker, I want to get back to work    Currently in Pain? Yes    Pain Score 7     Pain Location Knee    Pain Orientation Left;Right    Pain Descriptors / Indicators Shooting    Pain Type Chronic pain    Pain Onset More than a month ago    Pain Frequency Intermittent    Multiple Pain Sites Yes    Pain Score 5    Pain Orientation Right    Pain Descriptors / Indicators Aching;Sore    Pain  Type Chronic pain    Pain Onset More than a month ago    Pain Frequency Constant                             [facility] Adult PT Treatment/Exercise - 01/18/20 0001      Neuro Re-ed    Neuro Re-ed Details  side stepping 30 feet using hand held support      Knee/Hip Exercises: Aerobic   Nustep L5 x 8 minutes      Knee/Hip Exercises: Standing   Heel Raises Both;15 reps    Heel  Raises Limitations UE support    Hip Flexion Stengthening;Both;15 reps    Hip Flexion Limitations UE support    Hip Abduction Stengthening;15 reps      Knee/Hip Exercises: Seated   Sit to Sand 2 sets;10 reps;with UE support   24 inch chair, eccentric     Knee/Hip Exercises: Supine   Bridges Strengthening;Both;15 reps    Straight Leg Raises Strengthening;2 sets;10 reps   bialteral LE's                      PT Long Term Goals - 01/18/20 0946      PT LONG TERM GOAL #1   Title Pt will be indpedent in her HEP and porgression.    Status On-going      PT LONG TERM GOAL #2   Title Pt will be able to amb without an assistive device on communtiy surfaces.    Status On-going      PT LONG TERM GOAL #3   Title Pt will improve her bilateral LE strength to >/= +4/5 in order to improve fucntional mobility.    Status On-going      PT LONG TERM GOAL #4   Title Pt will be able to perform sit to stand in </= 20 seconds.    Status On-going      PT LONG TERM GOAL #5   Title Pt will be able to navigate 5 steps with rail with step over step pattern.    Status On-going      PT LONG TERM GOAL #6   Title Pt will improve her FOTO score from 67% limitation to </= 52% limitation.    Status On-going                 Plan - 01/18/20 0945    Clinical Impression Statement Pt tolerating exercises well today focusing more on standing activities. No goals met this session. Continue to progress toward pt's LTG's.    Personal Factors and Comorbidities Comorbidity 3+    Comorbidities bilateral  hip replacement, L TKA 2019, R shoulder bursitis, tendonitis, DJD back 2017, heart palpations, OA, vit B12-def, vit D def    Examination-Activity Limitations Transfers;Sit;Squat;Stairs;Other;Stand    Examination-Participation Restrictions Community Activity;Other;Laundry;Occupation    Stability/Clinical Decision Making Evolving/Moderate complexity    Rehab Potential Good    PT Frequency 2x / week    PT Duration 8 weeks    PT Treatment/Interventions ADLs/Self Care Home Management;Cryotherapy;Electrical Stimulation;Gait training;Stair training;Functional mobility training;Therapeutic activities;Therapeutic exercise;Balance training;Patient/family education;Manual techniques;Taping;Passive range of motion;DME Instruction;Moist Heat    PT Next Visit Plan Progress mobility of knees, strengthening in [place], CKC, progress static and dynamic balance on non compliant surfaces.    PT Home Exercise Plan Access Code: [email or web address]    Consulted and Agree with Plan of Care Patient           Patient will benefit from skilled therapeutic intervention in order to improve the following deficits and impairments:  Postural dysfunction, Decreased strength, Decreased mobility, Increased edema, Decreased balance, Impaired flexibility, Pain, Decreased activity tolerance, Obesity, Difficulty walking  Visit Diagnosis: Acute pain of right knee  Acute pain of left knee  Difficulty in walking, not elsewhere classified  Muscle weakness (generalized)  Localized edema     Problem List Patient Active Problem List   Diagnosis Date Noted  . Status post arthroscopy of right knee 12/11/2019  . 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 Acute medial meniscal tear, right, subsequent  encounter 08/27/2019  . History of total left knee replacement 07/16/2019  . Peroneal neuropathy at knee, left 06/25/2019  . Lumbosacral radiculopathy 06/07/2019  . 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 IDA (iron  deficiency anemia) 10/16/2018  . OA (osteoarthritis) of knee 01/20/2018  . Acute meniscal tear, medial 07/17/2017  . 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 Insomnia 11/08/2016  . Pernicious anemia 11/08/2016  . Leukopenia 07/09/2016  . Hypocupremia 07/09/2016  . Palpitations 04/12/2016  . 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 Lap gastric bypass May 2016 07/06/2014  . History of Clostridium difficile infection 08/12/2011  . Postoperative hypothyroidism 08/26/2010  . Gastroesophageal reflux disease 08/26/2010  . Vitamin D deficiency 08/26/2010  . Fibrocystic disease of breast 08/26/2010  . Cobalamin deficiency 08/26/2010  . Obesity 03/20/2010  . Backache 03/20/2010    Oretha Caprice, PT, MPT 01/18/2020, 10:08 AM  [facility] Physical Therapy [address] [place], [place], [address] Phone: [phone]   Fax:  [phone]  Name: Denecia Brunette MRN: [id number] Date of Birth: 11-Apr-1960

## 2020-01-19 MED FILL — tiZANidine HCL 4 MG TABS: 4 | 13 days supply | Qty: 40 | Fill #1

## 2020-01-20 ENCOUNTER — Encounter: Payer: Self-pay | Admitting: Physical Therapy

## 2020-01-20 ENCOUNTER — Ambulatory Visit (INDEPENDENT_AMBULATORY_CARE_PROVIDER_SITE_OTHER): Payer: No Typology Code available for payment source | Admitting: Physical Therapy

## 2020-01-20 ENCOUNTER — Other Ambulatory Visit: Payer: Self-pay

## 2020-01-20 DIAGNOSIS — M6281 Muscle weakness (generalized): Secondary | ICD-10-CM

## 2020-01-20 DIAGNOSIS — R262 Difficulty in walking, not elsewhere classified: Secondary | ICD-10-CM

## 2020-01-20 DIAGNOSIS — M25561 Pain in right knee: Secondary | ICD-10-CM

## 2020-01-20 DIAGNOSIS — M25562 Pain in left knee: Secondary | ICD-10-CM | POA: Diagnosis not present

## 2020-01-20 DIAGNOSIS — R600 Localized edema: Secondary | ICD-10-CM

## 2020-01-20 NOTE — Therapy (Signed)
[facility] Physical Therapy [address] [place], [place], [address] Phone: [phone]   Fax:  [phone]  Physical Therapy Treatment  Patient Details  Name: Victoria [name] MRN: [id number] Date of Birth: November 19, 1960 Referring Provider (PT): Jean Rosenthal, MD   Encounter Date: 01/20/2020   PT End of Session - 01/20/20 0901    Visit Number 8    Number of Visits 16    Date for PT Re-Evaluation 04/01/20    PT Start Time 0845    PT Stop Time 0928    PT Time Calculation (min) 43 min    Activity Tolerance Patient limited by pain    Behavior During Therapy [facility] for tasks assessed/performed           Past Medical History:  Diagnosis Date  . Bursitis    right shoulder  . Chronic leukopenia    intermittant since 2010, followed by Dr. Renold Genta now  . Degenerative joint disease of low back 09/02/15    Dr. Maureen Ralphs  . Fibrocystic breast   . GERD (gastroesophageal reflux disease)   . Headache    per pt more stress/tension  . Heart palpitations    SEE EPIC ENCOUTNER , CARDIOLOGY DR. Tressia Miners TURNER 2018; reports on 05-16-17 " i haven't had any bouts of those lately"    . Hemorrhoids   . History of Clostridium difficile infection 12/2010  . History of exercise intolerance    ETT on 04-27-2016-- negative ([facility] treadmill score 7)  . History of Helicobacter pylori infection 2001 and 09/ 2012  . HSV-2 infection    genital  . Hx of adenomatous colonic polyps 10/17/2007  . Hydradenitis    per pt currently treated with Humira injection  . Hypothyroidism, postsurgical 1980  . Medial meniscus tear    right knee  . Osteoarthritis   . Pernicious anemia    b12 def  . PONV (postoperative nausea and vomiting)   . 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 Tendonitis    right shoulder  . Varicose veins   . Vitamin B 12 deficiency   . Vitamin D deficiency     Past  Surgical History:  Procedure Laterality Date  . BREATH TEK H PYLORI N/A 05/03/2014   Procedure: BREATH TEK H PYLORI;  Surgeon: Kaylyn Lim, MD;  [facility];  Service: General;  Laterality: N/A;  . CARDIOVASCULAR STRESS TEST  03/11/2013   dr Tressia Miners turner   Low risk nuclear study w/ a very small anterior perfusion defect that most likely represents breast attenuation artifact, less likely this could represent a true perfusion abnormality in the distribution of diagonal branch of the LAD/  normal LV function and wall motion , ef 66%  . CATARACT EXTRACTION W/ INTRAOCULAR LENS  IMPLANT, BILATERAL  10/2017  . CESAREAN SECTION  1985  . CHOLECYSTECTOMY OPEN  1987  . COLONOSCOPY  02/2017   Dr [name] ; per patient they found 7 polyps with polypectomy; on 5 year track   . FINE NEEDLE ASPIRATION Left 05/22/2017   Procedure: LEFT KNEE ASPIRATION;  Surgeon: Gaynelle Arabian, MD;  [facility];  Service: Orthopedics;  Laterality: Left;  Marland Kitchen GASTRIC ROUX-EN-Y N/A 07/06/2014   Procedure: LAPAROSCOPIC ROUX-EN-Y GASTRIC BYPASS, ENTEROLYSIS OF ADHESIONS WITH UPPER ENDOSCOPY;  Surgeon: Johnathan Hausen, MD;  [facility];  Service: General;  Laterality: N/A;  . HAMMER TOE SURGERY Bilateral 02/17/2008   bilateral foot digit's 2  and 5  . HIATAL HERNIA REPAIR N/A 07/06/2014   Procedure: LAPAROSCOPIC REPAIR OF HIATAL HERNIA;  Surgeon: Johnathan Hausen, MD;  [facility];  Service: General;  Laterality: N/A;  . I & D KNEE WITH POLY EXCHANGE Left 12/11/2019   Procedure: LEFT KNEE POLY-LINER EXCHANGE;  Surgeon: Mcarthur Rossetti, MD;  Location: [place];  Service: Orthopedics;  Laterality: Left;  . KNEE ARTHROSCOPY Right 12/11/2019   Procedure: RIGHT KNEE ARTHROSCOPY WITH PARTIAL MEDIAL MENISCECTOMY;  Surgeon: Mcarthur Rossetti, MD;  Location: [place];  Service: Orthopedics;  Laterality: Right;  . KNEE ARTHROSCOPY WITH MEDIAL MENISECTOMY Left 07/17/2017   Procedure: LEFT KNEE ARTHROSCOPY WITH MEDIAL  LATERAL MENISECTOMY;  Surgeon: Gaynelle Arabian, MD;  [facility];  Service: Orthopedics;  Laterality: Left;  Marland Kitchen MASS EXCISION N/A 10/11/2016   Procedure: EXCISION OF PERIANAL MASS;  Surgeon: [name], MD;  Location: [place];  Service: General;  Laterality: N/A;  . REFRACTIVE SURGERY    . THYROIDECTOMY  1980  . TOTAL ABDOMINAL HYSTERECTOMY  08-25-2002   dr Margaretha Glassing   ovaries retained  . TOTAL HIP ARTHROPLASTY Right 05/21/2012   Procedure: TOTAL HIP ARTHROPLASTY;  Surgeon: Gearlean Alf, MD;  [facility];  Service: Orthopedics;  Laterality: Right;  . TOTAL HIP ARTHROPLASTY Left 01-31-2009  dr Wynelle Link  . TOTAL HIP REVISION Left 05/22/2017   Procedure: Left hip bearing surface and head revision;  Surgeon: Gaynelle Arabian, MD;  [facility];  Service: Orthopedics;  Laterality: Left;  . TOTAL KNEE ARTHROPLASTY Left 01/20/2018   Procedure: LEFT TOTAL KNEE ARTHROPLASTY;  Surgeon: Gaynelle Arabian, MD;  [facility];  Service: Orthopedics;  Laterality: Left;  37min  . TRANSTHORACIC ECHOCARDIOGRAM  05/03/2016   ef 55-60%/  trivial MR/  mild TR  . TUBAL LIGATION      There were no vitals filed for this visit.   Subjective Assessment - 01/20/20 0852    Subjective Pt arriving today reporting 5/10 pain in bilateral knees.    Pertinent History Bilateral THA, L TKA, tendonitis, R shoulder bursitis, DJD back 2017, GERD, heart palpitations, OA, PONV, vit D deficiency, bit B12 deficiency    Diagnostic tests MRI    Patient Stated Goals walk without walker, I want to get back to work    Currently in Pain? Yes    Pain Score 5     Pain Location Knee    Pain Orientation Right;Left    Pain Descriptors / Indicators Aching;Sore    Pain Type Chronic pain    Pain Onset More than a month ago                             [facility] Adult PT Treatment/Exercise - 01/20/20 0001      Knee/Hip Exercises: Aerobic   Recumbent Bike rocking back and forth        Knee/Hip Exercises: Machines for Strengthening   Cybex Knee Flexion 20 # bilateral LE's     Total Gym Leg Press bilateral LE's 100#  2x 20       Knee/Hip Exercises: Standing   Heel Raises Both;15 reps    Heel Raises Limitations UE support    Hip Flexion Stengthening;Both;15 reps    Hip Flexion Limitations UE support    Hip Abduction Stengthening;15 reps      Knee/Hip Exercises: Seated   Sit to Sand 2 sets;10 reps;with UE support   24 inch chair, eccentric  Knee/Hip Exercises: Supine   Bridges Strengthening;Both;15 reps    Straight Leg Raises Strengthening;2 sets;10 reps   bialteral LE's                 PT Education - 01/20/20 0900    Education Details Exercise technique    Person(s) Educated Patient    Methods Explanation    Comprehension Verbalized understanding;Returned demonstration               PT Long Term Goals - 01/18/20 0946      PT LONG TERM GOAL #1   Title Pt will be indpedent in her HEP and porgression.    Status On-going      PT LONG TERM GOAL #2   Title Pt will be able to amb without an assistive device on communtiy surfaces.    Status On-going      PT LONG TERM GOAL #3   Title Pt will improve her bilateral LE strength to >/= +4/5 in order to improve fucntional mobility.    Status On-going      PT LONG TERM GOAL #4   Title Pt will be able to perform sit to stand in </= 20 seconds.    Status On-going      PT LONG TERM GOAL #5   Title Pt will be able to navigate 5 steps with rail with step over step pattern.    Status On-going      PT LONG TERM GOAL #6   Title Pt will improve her FOTO score from 67% limitation to </= 52% limitation.    Status On-going                 Plan - 01/20/20 0901    Clinical Impression Statement Pt tolreating all exericses well progressing with strengtheing and balance activiteis. Continue skilled PT.    Comorbidities bilateral hip replacement, L TKA 2019, R shoulder bursitis, tendonitis, DJD back  2017, heart palpations, OA, vit B12-def, vit D def    Examination-Activity Limitations Transfers;Sit;Squat;Stairs;Other;Stand    Examination-Participation Restrictions Community Activity;Other;Laundry;Occupation    Stability/Clinical Decision Making Evolving/Moderate complexity    Rehab Potential Good    PT Frequency 2x / week    PT Duration 8 weeks    PT Treatment/Interventions ADLs/Self Care Home Management;Cryotherapy;Electrical Stimulation;Gait training;Stair training;Functional mobility training;Therapeutic activities;Therapeutic exercise;Balance training;Patient/family education;Manual techniques;Taping;Passive range of motion;DME Instruction;Moist Heat    PT Next Visit Plan Progress mobility of knees, strengthening in [place], CKC, progress static and dynamic balance on non compliant surfaces.    PT Home Exercise Plan Access Code: [email or web address]    Consulted and Agree with Plan of Care Patient           Patient will benefit from skilled therapeutic intervention in order to improve the following deficits and impairments:  Postural dysfunction, Decreased strength, Decreased mobility, Increased edema, Decreased balance, Impaired flexibility, Pain, Decreased activity tolerance, Obesity, Difficulty walking  Visit Diagnosis: Acute pain of left knee  Acute pain of right knee  Difficulty in walking, not elsewhere classified  Muscle weakness (generalized)  Localized edema     Problem List Patient Active Problem List   Diagnosis Date Noted  . Status post arthroscopy of right knee 12/11/2019  . 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 Acute medial meniscal tear, right, subsequent encounter 08/27/2019  . History of total left knee replacement 07/16/2019  . Peroneal neuropathy at knee, left 06/25/2019  . Lumbosacral radiculopathy 06/07/2019  . 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  IDA (iron deficiency anemia) 10/16/2018  . OA (osteoarthritis) of knee 01/20/2018  .  Acute meniscal tear, medial 07/17/2017  . 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 Insomnia 11/08/2016  . Pernicious anemia 11/08/2016  . Leukopenia 07/09/2016  . Hypocupremia 07/09/2016  . Palpitations 04/12/2016  . 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 Lap gastric bypass May 2016 07/06/2014  . History of Clostridium difficile infection 08/12/2011  . Postoperative hypothyroidism 08/26/2010  . Gastroesophageal reflux disease 08/26/2010  . Vitamin D deficiency 08/26/2010  . Fibrocystic disease of breast 08/26/2010  . Cobalamin deficiency 08/26/2010  . Obesity 03/20/2010  . Backache 03/20/2010    Oretha Caprice, PT, MPT 01/20/2020, 9:30 AM  [facility] Physical Therapy [address] [place], [place], [address] Phone: [phone]   Fax:  [phone]  Name: Victoria [name] MRN: [id number] Date of Birth: 1960/05/07

## 2020-01-24 NOTE — Patient Instructions (Addendum)
Continue monthly B12 injections.  Continue current medications.  Iron level normal at 88.  Recuperating with walker from bilateral knee surgery per Dr. Ninfa Linden.  Pain is tolerable with gabapentin.  TSH is stable on thyroid replacement medication.  Return in May for health maintenance exam

## 2020-01-25 ENCOUNTER — Other Ambulatory Visit: Payer: Self-pay

## 2020-01-25 ENCOUNTER — Ambulatory Visit (INDEPENDENT_AMBULATORY_CARE_PROVIDER_SITE_OTHER): Payer: No Typology Code available for payment source | Admitting: Orthopaedic Surgery

## 2020-01-25 ENCOUNTER — Ambulatory Visit (INDEPENDENT_AMBULATORY_CARE_PROVIDER_SITE_OTHER): Payer: No Typology Code available for payment source | Admitting: Physical Therapy

## 2020-01-25 DIAGNOSIS — Z96652 Presence of left artificial knee joint: Secondary | ICD-10-CM

## 2020-01-25 DIAGNOSIS — M25561 Pain in right knee: Secondary | ICD-10-CM

## 2020-01-25 DIAGNOSIS — Z9889 Other specified postprocedural states: Secondary | ICD-10-CM

## 2020-01-25 DIAGNOSIS — M6281 Muscle weakness (generalized): Secondary | ICD-10-CM | POA: Diagnosis not present

## 2020-01-25 DIAGNOSIS — R262 Difficulty in walking, not elsewhere classified: Secondary | ICD-10-CM

## 2020-01-25 DIAGNOSIS — M25562 Pain in left knee: Secondary | ICD-10-CM | POA: Diagnosis not present

## 2020-01-25 DIAGNOSIS — R600 Localized edema: Secondary | ICD-10-CM

## 2020-01-25 MED FILL — GABAPENTIN 600 MG TABLET: 600 | 30 days supply | Qty: 90 | Fill #1

## 2020-01-25 NOTE — Therapy (Signed)
[facility] Physical Therapy [address] [place], [place], [address] Phone: [phone]   Fax:  [phone]  Physical Therapy Treatment  Patient Details  Name: Victoria [name] MRN: [id number] Date of Birth: 1960-05-11 Referring Provider (PT): Jean Rosenthal, MD   Encounter Date: 01/25/2020   PT End of Session - 01/25/20 1111    Visit Number 9    Number of Visits 16    Date for PT Re-Evaluation 04/01/20    PT Start Time 1104    PT Stop Time 1143    PT Time Calculation (min) 39 min    Activity Tolerance Patient limited by pain    Behavior During Therapy [facility] for tasks assessed/performed           Past Medical History:  Diagnosis Date  . Bursitis    right shoulder  . Chronic leukopenia    intermittant since 2010, followed by Dr. Renold Genta now  . Degenerative joint disease of low back 09/02/15    Dr. Maureen Ralphs  . Fibrocystic breast   . GERD (gastroesophageal reflux disease)   . Headache    per pt more stress/tension  . Heart palpitations    SEE EPIC ENCOUTNER , CARDIOLOGY DR. Tressia Miners TURNER 2018; reports on 05-16-17 " i haven't had any bouts of those lately"    . Hemorrhoids   . History of Clostridium difficile infection 12/2010  . History of exercise intolerance    ETT on 04-27-2016-- negative ([facility] treadmill score 7)  . History of Helicobacter pylori infection 2001 and 09/ 2012  . HSV-2 infection    genital  . Hx of adenomatous colonic polyps 10/17/2007  . Hydradenitis    per pt currently treated with Humira injection  . Hypothyroidism, postsurgical 1980  . Medial meniscus tear    right knee  . Osteoarthritis   . Pernicious anemia    b12 def  . PONV (postoperative nausea and vomiting)   . 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 Tendonitis    right shoulder  . Varicose veins   . Vitamin B 12 deficiency   . Vitamin D deficiency     Past  Surgical History:  Procedure Laterality Date  . BREATH TEK H PYLORI N/A 05/03/2014   Procedure: BREATH TEK H PYLORI;  Surgeon: Kaylyn Lim, MD;  Location: [facility];  Service: General;  Laterality: N/A;  . CARDIOVASCULAR STRESS TEST  03/11/2013   dr Tressia Miners turner   Low risk nuclear study w/ a very small anterior perfusion defect that most likely represents breast attenuation artifact, less likely this could represent a true perfusion abnormality in the distribution of diagonal branch of the LAD/  normal LV function and wall motion , ef 66%  . CATARACT EXTRACTION W/ INTRAOCULAR LENS  IMPLANT, BILATERAL  10/2017  . CESAREAN SECTION  1985  . CHOLECYSTECTOMY OPEN  1987  . COLONOSCOPY  02/2017   Dr [name] ; per patient they found 7 polyps with polypectomy; on 5 year track   . FINE NEEDLE ASPIRATION Left 05/22/2017   Procedure: LEFT KNEE ASPIRATION;  Surgeon: Gaynelle Arabian, MD;  Location: [facility];  Service: Orthopedics;  Laterality: Left;  Marland Kitchen GASTRIC ROUX-EN-Y N/A 07/06/2014   Procedure: LAPAROSCOPIC ROUX-EN-Y GASTRIC BYPASS, ENTEROLYSIS OF ADHESIONS WITH UPPER ENDOSCOPY;  Surgeon: Johnathan Hausen, MD;  Location: [facility];  Service: General;  Laterality: N/A;  . HAMMER TOE SURGERY Bilateral 02/17/2008   bilateral foot digit's 2  and 5  . HIATAL HERNIA REPAIR N/A 07/06/2014   Procedure: LAPAROSCOPIC REPAIR OF HIATAL HERNIA;  Surgeon: Johnathan Hausen, MD;  Location: [facility];  Service: General;  Laterality: N/A;  . I & D KNEE WITH POLY EXCHANGE Left 12/11/2019   Procedure: LEFT KNEE POLY-LINER EXCHANGE;  Surgeon: Mcarthur Rossetti, MD;  Location: [place];  Service: Orthopedics;  Laterality: Left;  . KNEE ARTHROSCOPY Right 12/11/2019   Procedure: RIGHT KNEE ARTHROSCOPY WITH PARTIAL MEDIAL MENISCECTOMY;  Surgeon: Mcarthur Rossetti, MD;  Location: [place];  Service: Orthopedics;  Laterality: Right;  . KNEE ARTHROSCOPY WITH MEDIAL MENISECTOMY Left 07/17/2017   Procedure: LEFT KNEE ARTHROSCOPY WITH MEDIAL  LATERAL MENISECTOMY;  Surgeon: Gaynelle Arabian, MD;  Location: [facility];  Service: Orthopedics;  Laterality: Left;  Marland Kitchen MASS EXCISION N/A 10/11/2016   Procedure: EXCISION OF PERIANAL MASS;  Surgeon: [name], MD;  Location: [place];  Service: General;  Laterality: N/A;  . REFRACTIVE SURGERY    . THYROIDECTOMY  1980  . TOTAL ABDOMINAL HYSTERECTOMY  08-25-2002   dr Margaretha Glassing   ovaries retained  . TOTAL HIP ARTHROPLASTY Right 05/21/2012   Procedure: TOTAL HIP ARTHROPLASTY;  Surgeon: Gearlean Alf, MD;  Location: [facility];  Service: Orthopedics;  Laterality: Right;  . TOTAL HIP ARTHROPLASTY Left 01-31-2009  dr Wynelle Link  . TOTAL HIP REVISION Left 05/22/2017   Procedure: Left hip bearing surface and head revision;  Surgeon: Gaynelle Arabian, MD;  Location: [facility];  Service: Orthopedics;  Laterality: Left;  . TOTAL KNEE ARTHROPLASTY Left 01/20/2018   Procedure: LEFT TOTAL KNEE ARTHROPLASTY;  Surgeon: Gaynelle Arabian, MD;  Location: [facility];  Service: Orthopedics;  Laterality: Left;  6min  . TRANSTHORACIC ECHOCARDIOGRAM  05/03/2016   ef 55-60%/  trivial MR/  mild TR  . TUBAL LIGATION      There were no vitals filed for this visit.   Subjective Assessment - 01/25/20 1125    Subjective Pt reporting today 8/10 pain after just recieving an injection in her  R knee by Dr. Ninfa Linden. Pt with follow up in 4 weeks.    Pertinent History Bilateral THA, L TKA, tendonitis, R shoulder bursitis, DJD back 2017, GERD, heart palpitations, OA, PONV, vit D deficiency, bit B12 deficiency    Diagnostic tests MRI    Patient Stated Goals walk without walker, I want to get back to work    Currently in Pain? Yes    Pain Score 8     Pain Location Knee    Pain Orientation Right;Left    Pain Descriptors / Indicators Aching;Sore;Burning    Pain Type Chronic pain    Pain Onset More than a month ago                             [facility] Adult PT Treatment/Exercise - 01/25/20 0001       Knee/Hip Exercises: Aerobic   Recumbent Bike rocking back and forth       Knee/Hip Exercises: Machines for Strengthening   Cybex Knee Flexion 20 # bilateral LE's     Total Gym Leg Press bilateral LE's 100#  2x 20       Knee/Hip Exercises: Standing   Heel Raises Both;15 reps    Heel Raises Limitations UE support    Hip Flexion Stengthening;Both;15 reps    Hip Flexion Limitations UE support    Hip Abduction Stengthening;15 reps    Forward Step Up Right;Left;10 reps  Forward Step Up Limitations UE support in parallel bars    Other Standing Knee Exercises step taps on cones x 20 reps      Knee/Hip Exercises: Seated   Sit to Sand 2 sets;10 reps;with UE support   24 inch chair, eccentric     Knee/Hip Exercises: Supine   Bridges Strengthening;Both;15 reps    Straight Leg Raises Strengthening;2 sets;10 reps   bialteral LE's                      PT Long Term Goals - 01/25/20 1119      PT LONG TERM GOAL #1   Title Pt will be indpedent in her HEP and porgression.    Status On-going      PT LONG TERM GOAL #2   Title Pt will be able to amb without an assistive device on communtiy surfaces.    Status On-going      PT LONG TERM GOAL #3   Title Pt will improve her bilateral LE strength to >/= +4/5 in order to improve fucntional mobility.    Status On-going      PT LONG TERM GOAL #4   Title Pt will be able to perform sit to stand in </= 20 seconds.    Status On-going      PT LONG TERM GOAL #5   Title Pt will be able to navigate 5 steps with rail with step over step pattern.      PT LONG TERM GOAL #6   Title Pt will improve her FOTO score from 67% limitation to </= 52% limitation.    Status On-going                 Plan - 01/25/20 1114    Clinical Impression Statement Pt reporting having a bad weekend after helping cook Thansgiving meal. Pt reporting being wiped out for 2 days. Pt reporting right  knee injection right before therapy this morning. Pt reporting  8/10 pain in R knee this morning. Pt able to perform all exercises with progression of strengthening and balance activiteis. Continue skilled PT.    Personal Factors and Comorbidities Comorbidity 3+    Comorbidities bilateral hip replacement, L TKA 2019, R shoulder bursitis, tendonitis, DJD back 2017, heart palpations, OA, vit B12-def, vit D def    Examination-Activity Limitations Transfers;Sit;Squat;Stairs;Other;Stand    Examination-Participation Restrictions Community Activity;Other;Laundry;Occupation    Stability/Clinical Decision Making Evolving/Moderate complexity    Rehab Potential Good    PT Frequency 2x / week    PT Duration 8 weeks    PT Treatment/Interventions ADLs/Self Care Home Management;Cryotherapy;Electrical Stimulation;Gait training;Stair training;Functional mobility training;Therapeutic activities;Therapeutic exercise;Balance training;Patient/family education;Manual techniques;Taping;Passive range of motion;DME Instruction;Moist Heat    PT Next Visit Plan Progress mobility of knees, strengthening in [place], CK[place], progress static and dynamic balance on non compliant surfaces.    PT Home Exercise Plan Access Code: [email or web address]    Consulted and Agree with Plan of Care Patient           Patient will benefit from skilled therapeutic intervention in order to improve the following deficits and impairments:  Postural dysfunction, Decreased strength, Decreased mobility, Increased edema, Decreased balance, Impaired flexibility, Pain, Decreased activity tolerance, Obesity, Difficulty walking  Visit Diagnosis: Acute pain of left knee  Acute pain of right knee  Difficulty in walking, not elsewhere classified  Muscle weakness (generalized)  Localized edema     Problem List Patient Active Problem List   Diagnosis Date Noted  .  Status post arthroscopy of right knee 12/11/2019  . 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 Acute medial meniscal tear, right,  subsequent encounter 08/27/2019  . History of total left knee replacement 07/16/2019  . Peroneal neuropathy at knee, left 06/25/2019  . Lumbosacral radiculopathy 06/07/2019  . 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 IDA (iron deficiency anemia) 10/16/2018  . OA (osteoarthritis) of knee 01/20/2018  . Acute meniscal tear, medial 07/17/2017  . 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 Insomnia 11/08/2016  . Pernicious anemia 11/08/2016  . Leukopenia 07/09/2016  . Hypocupremia 07/09/2016  . Palpitations 04/12/2016  . 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 Lap gastric bypass May 2016 07/06/2014  . History of Clostridium difficile infection 08/12/2011  . Postoperative hypothyroidism 08/26/2010  . Gastroesophageal reflux disease 08/26/2010  . Vitamin D deficiency 08/26/2010  . Fibrocystic disease of breast 08/26/2010  . Cobalamin deficiency 08/26/2010  . Obesity 03/20/2010  . Backache 03/20/2010    Oretha Caprice, PT, MPT 01/25/2020, 11:27 AM  [facility] Physical Therapy [address] [place], [place], [address] Phone: [phone]   Fax:  [phone]  Name: Victoria [name] MRN: [id number] Date of Birth: 21-Jul-1960

## 2020-01-25 NOTE — Progress Notes (Signed)
The patient is now 6 weeks status post a right knee arthroscopy with partial medial meniscectomy and at the same time left knee polyliner exchange with removal of scar tissue.  She is still ambulating with a walker.  She is going to physical therapy and having more problems with her right knee than her left knee.  There is no effusion with the right knee and there is medial joint line tenderness which is appropriate that is post arthroscopic intervention.  At 6 weeks I feel is reasonable to place a steroid injection in her right knee today to help with any acute pain and she agrees with this as well.  I did place a steroid injection in her right knee without difficulty.  She will continue physical therapy.  I like to see her back in 4 weeks to see if she is getting closer to being able to return to work.  I would have no issues with extending her out of work if she has not fully recovered from bilateral knee surgery.  All question concerns were answered and addressed.

## 2020-01-26 ENCOUNTER — Telehealth: Payer: Self-pay | Admitting: Orthopaedic Surgery

## 2020-01-26 NOTE — Telephone Encounter (Signed)
Victoria [name] with [place] insurance called asking for appt notes from 01/25/20; would like to have the notes faxed.  Victoria [name] CB# [phone] Victoria [name] Fax# [phone]  Claim # [id number]

## 2020-01-27 ENCOUNTER — Other Ambulatory Visit: Payer: Self-pay

## 2020-01-27 ENCOUNTER — Encounter: Payer: Self-pay | Admitting: Rehabilitative and Restorative Service Providers"

## 2020-01-27 ENCOUNTER — Ambulatory Visit (INDEPENDENT_AMBULATORY_CARE_PROVIDER_SITE_OTHER): Payer: No Typology Code available for payment source | Admitting: Rehabilitative and Restorative Service Providers"

## 2020-01-27 ENCOUNTER — Encounter: Payer: Self-pay | Admitting: Orthopaedic Surgery

## 2020-01-27 ENCOUNTER — Telehealth: Payer: Self-pay | Admitting: Orthopaedic Surgery

## 2020-01-27 DIAGNOSIS — M25561 Pain in right knee: Secondary | ICD-10-CM | POA: Diagnosis not present

## 2020-01-27 DIAGNOSIS — M25562 Pain in left knee: Secondary | ICD-10-CM | POA: Diagnosis not present

## 2020-01-27 DIAGNOSIS — R600 Localized edema: Secondary | ICD-10-CM

## 2020-01-27 DIAGNOSIS — M6281 Muscle weakness (generalized): Secondary | ICD-10-CM | POA: Diagnosis not present

## 2020-01-27 DIAGNOSIS — R262 Difficulty in walking, not elsewhere classified: Secondary | ICD-10-CM | POA: Diagnosis not present

## 2020-01-27 MED FILL — HUMIRA PEN 40 MG/0.8ML PNKT: 40 | 28 days supply | Qty: 4 | Fill #1

## 2020-01-27 NOTE — Telephone Encounter (Signed)
[place] forms received. Sent to [employer] 

## 2020-01-27 NOTE — Therapy (Signed)
[facility] [address] [place], [place], [address] Phone: [phone]   Fax:  [phone]  Physical Therapy Treatment/Progress Note  Patient Details  Name: Victoria [name] MRN: [id number] Date of Birth: [birth year] Referring Provider (PT): Jean Rosenthal, MD   Encounter Date: 01/27/2020   Progress Note Reporting Period 12/28/2019 to 01/27/2020  See note below for Objective Data and Assessment of Progress/Goals.        PT End of Session - 01/27/20 0805    Visit Number 10    Number of Visits 22    Date for PT Re-Evaluation 04/01/20    PT Start Time 0800    PT Stop Time 0843    PT Time Calculation (min) 43 min    Activity Tolerance Patient limited by pain    Behavior During Therapy [facility] for tasks assessed/performed           Past Medical History:  Diagnosis Date  . Bursitis    right shoulder  . Chronic leukopenia    intermittant since 2010, followed by Dr. Renold Genta now  . Degenerative joint disease of low back 09/02/15    Dr. Maureen Ralphs  . Fibrocystic breast   . GERD (gastroesophageal reflux disease)   . Headache    per pt more stress/tension  . Heart palpitations    SEE EPIC ENCOUTNER , CARDIOLOGY DR. Tressia Miners TURNER 2018; reports on 05-16-17 " i haven't had any bouts of those lately"    . Hemorrhoids   . History of Clostridium difficile infection 12/2010  . History of exercise intolerance    ETT on 04-27-2016-- negative ([facility] treadmill score 7)  . History of Helicobacter pylori infection 2001 and 09/ 2012  . HSV-2 infection    genital  . Hx of adenomatous colonic polyps 10/17/2007  . Hydradenitis    per pt currently treated with Humira injection  . Hypothyroidism, postsurgical 1980  . Medial meniscus tear    right knee  . Osteoarthritis   . Pernicious anemia    b12 def  . PONV (postoperative nausea and vomiting)   . 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 Reactive hypoglycemia followed by pcp   post  gastrectomy dumping syndrome  . Tendonitis    right shoulder  . Varicose veins   . Vitamin B 12 deficiency   . Vitamin D deficiency     Past Surgical History:  Procedure Laterality Date  . BREATH TEK H PYLORI N/A 05/03/2014   Procedure: BREATH TEK H PYLORI;  Surgeon: Kaylyn Lim, MD;  Location: [facility];  Service: General;  Laterality: N/A;  . CARDIOVASCULAR STRESS TEST  03/11/2013   dr Tressia Miners turner   Low risk nuclear study w/ a very small anterior perfusion defect that most likely represents breast attenuation artifact, less likely this could represent a true perfusion abnormality in the distribution of diagonal branch of the LAD/  normal LV function and wall motion , ef 66%  . CATARACT EXTRACTION W/ INTRAOCULAR LENS  IMPLANT, BILATERAL  10/2017  . CESAREAN SECTION  1985  . CHOLECYSTECTOMY OPEN  1987  . COLONOSCOPY  02/2017   Dr [name] ; per patient they found 7 polyps with polypectomy; on 5 year track   . FINE NEEDLE ASPIRATION Left 05/22/2017   Procedure: LEFT KNEE ASPIRATION;  Surgeon: Gaynelle Arabian, MD;  Location: [facility];  Service: Orthopedics;  Laterality: Left;  Marland Kitchen GASTRIC ROUX-EN-Y N/A 07/06/2014   Procedure: LAPAROSCOPIC ROUX-EN-Y GASTRIC BYPASS, ENTEROLYSIS OF ADHESIONS WITH UPPER ENDOSCOPY;  Surgeon:  Johnathan Hausen, MD;  Location: [facility];  Service: General;  Laterality: N/A;  . HAMMER TOE SURGERY Bilateral 02/17/2008   bilateral foot digit's 2 and 5  . HIATAL HERNIA REPAIR N/A 07/06/2014   Procedure: LAPAROSCOPIC REPAIR OF HIATAL HERNIA;  Surgeon: Johnathan Hausen, MD;  Location: [facility];  Service: General;  Laterality: N/A;  . I & D KNEE WITH POLY EXCHANGE Left 12/11/2019   Procedure: LEFT KNEE POLY-LINER EXCHANGE;  Surgeon: Mcarthur Rossetti, MD;  Location: [place];  Service: Orthopedics;  Laterality: Left;  . KNEE ARTHROSCOPY Right 12/11/2019   Procedure: RIGHT KNEE ARTHROSCOPY WITH PARTIAL MEDIAL MENISCECTOMY;  Surgeon: Mcarthur Rossetti, MD;  Location: [place];   Service: Orthopedics;  Laterality: Right;  . KNEE ARTHROSCOPY WITH MEDIAL MENISECTOMY Left 07/17/2017   Procedure: LEFT KNEE ARTHROSCOPY WITH MEDIAL LATERAL MENISECTOMY;  Surgeon: Gaynelle Arabian, MD;  Location: [facility];  Service: Orthopedics;  Laterality: Left;  Marland Kitchen MASS EXCISION N/A 10/11/2016   Procedure: EXCISION OF PERIANAL MASS;  Surgeon: [name], MD;  Location: [place];  Service: General;  Laterality: N/A;  . REFRACTIVE SURGERY    . THYROIDECTOMY  1980  . TOTAL ABDOMINAL HYSTERECTOMY  08-25-2002   dr Margaretha Glassing   ovaries retained  . TOTAL HIP ARTHROPLASTY Right 05/21/2012   Procedure: TOTAL HIP ARTHROPLASTY;  Surgeon: Gearlean Alf, MD;  Location: [facility];  Service: Orthopedics;  Laterality: Right;  . TOTAL HIP ARTHROPLASTY Left 01-31-2009  dr Wynelle Link  . TOTAL HIP REVISION Left 05/22/2017   Procedure: Left hip bearing surface and head revision;  Surgeon: Gaynelle Arabian, MD;  Location: [facility];  Service: Orthopedics;  Laterality: Left;  . TOTAL KNEE ARTHROPLASTY Left 01/20/2018   Procedure: LEFT TOTAL KNEE ARTHROPLASTY;  Surgeon: Gaynelle Arabian, MD;  Location: [facility];  Service: Orthopedics;  Laterality: Left;  46min  . TRANSTHORACIC ECHOCARDIOGRAM  05/03/2016   ef 55-60%/  trivial MR/  mild TR  . TUBAL LIGATION      There were no vitals filed for this visit.   Subjective Assessment - 01/27/20 0802    Subjective Pt. indicated having injection on Monday.  Pt. stated feeling pain yesterday when trying to walk around and do some things.  Noted swelling in both ankles today. GROC +5    Pertinent History Bilateral THA, L TKA, tendonitis, R shoulder bursitis, DJD back 2017, GERD, heart palpitations, OA, PONV, vit D deficiency, bit B12 deficiency    Diagnostic tests MRI    Patient Stated Goals walk without walker, I want to get back to work    Currently in Pain? Yes    Pain Score 5    moderate   Pain Orientation Left;Right    Pain Descriptors / Indicators Aching;Sore     Pain Type --    Pain Onset More than a month ago    Pain Frequency Intermittent    Aggravating Factors  walking, standing, increased activity.    Pain Relieving Factors medicine, rest              [facility] PT Assessment - 01/27/20 0001      Assessment   Medical Diagnosis R knee arthroscopy partial medial meniscectomy, L TKA revision     Referring Provider (PT) Jean Rosenthal, MD    Onset Date/Surgical Date 12/11/19      Observation/Other Assessments   Focus on Therapeutic Outcomes (FOTO)  status update 35% (65% limitation), predicted 48%      Functional Tests   Functional tests  Single leg stance      Single Leg Stance   Comments Lt SLS 8 seconds, Rt SLS < 3 seconds      AROM   Right Knee Extension 0    Right Knee Flexion 120    Left Knee Extension 0    Left Knee Flexion 90      Strength   Right Hip Flexion 5/5    Left Hip Flexion 5/5    Right Knee Flexion 4+/5    Right Knee Extension 4/5    Left Knee Flexion 4+/5    Left Knee Extension 4+/5      Transfers   Five time sit to stand comments  30   independent, 18 inch      Ambulation/Gait   Gait Comments FWW in clinic, intermittent use of FWW and furnature cruiser                         [facility] Adult PT Treatment/Exercise - 01/27/20 0001      Self-Care   Self-Care Other Self-Care Comments    Other Self-Care Comments  Self care instruction for after activity swelling elevation, adaptive activity planning for shortening length of time of activity to avoid exacerbations of symptoms when able.  Discussed pain monitoring model  of green, (1-2/10), yellow (3-4/10) and Red 5/10 or greater )      Neuro Re-ed    Neuro Re-ed Details  SLS 10 sec attempt x 1 bilateral, heel/toe raises 30x      Knee/Hip Exercises: Aerobic   Nustep Lvl 6 10 mins      Knee/Hip Exercises: Machines for Strengthening   Total Gym Leg Press SL 3 x 10, performed bilateral      Knee/Hip Exercises: Standing   Other Standing  Knee Exercises lateral stepping at table 8 ft x 5 each way      Knee/Hip Exercises: Seated   Other Seated Knee/Hip Exercises SLR 2 x 10 bilateral LE's     Sit to Sand without UE support;20 reps   18 inch table, no UE     Manual Therapy   Manual therapy comments seated MWM flexion, distraction, IR on Lt LE                    PT Short Term Goals - 01/27/20 0849      PT SHORT TERM GOAL #1   Title Pt. will demonstrate bilateral knee flexion AROM > 110 degreess for functional mobility in squats, walking, stairs.    Time 4    Period Weeks    Status New    Target Date 02/24/20             PT Long Term Goals - 01/27/20 0847      PT LONG TERM GOAL #1   Title Pt will be indpedent in her HEP and porgression.    Time 8    Period Weeks    Status Revised    Target Date 03/23/20      PT LONG TERM GOAL #2   Title Pt will be able to amb without an assistive device on communtiy surfaces.    Time 8    Status Revised    Target Date 03/23/20      PT LONG TERM GOAL #3   Title Pt will improve her bilateral LE strength to >/= +4/5 in order to improve fucntional mobility.    Time 8    Period Weeks  Status Revised    Target Date 03/23/20      PT LONG TERM GOAL #4   Title Pt will be able to perform sit to stand in </= 20 seconds.    Time 8    Status Revised    Target Date 03/23/20      PT LONG TERM GOAL #5   Title Pt will be able to navigate 5 steps with rail with step over step pattern.    Time 8    Period Weeks    Status Revised    Target Date 03/23/20      PT LONG TERM GOAL #6   Title Pt will improve her FOTO score from 67% limitation to </= 52% limitation.    Time 8    Period Weeks    Status Revised    Target Date 03/23/20                 Plan - 01/27/20 0805    Clinical Impression Statement Pt. has attended 10 visits overall during course of treatment. GROC rated at + 5 quite a bit better at this time, reporting feeling stronger.  See objective data  for updated information.  Pt. has continued complaints of moderate to severe bilateral knee pain c strength, mobility and movement coordination deficits that continue to impact daily activity.  Pt. may continue to benefit from skilled PT services at this time.    Personal Factors and Comorbidities Comorbidity 3+    Comorbidities bilateral hip replacement, L TKA 2019, R shoulder bursitis, tendonitis, DJD back 2017, heart palpations, OA, vit B12-def, vit D def    Examination-Activity Limitations Transfers;Sit;Squat;Stairs;Other;Stand    Examination-Participation Restrictions Community Activity;Other;Laundry;Occupation    Stability/Clinical Decision Making Evolving/Moderate complexity    Rehab Potential Good    PT Frequency Other (comment)   1-2x/week   PT Duration 8 weeks    PT Treatment/Interventions ADLs/Self Care Home Management;Cryotherapy;Electrical Stimulation;Gait training;Stair training;Functional mobility training;Therapeutic activities;Therapeutic exercise;Balance training;Patient/family education;Manual techniques;Taping;Passive range of motion;DME Instruction;Moist Heat    PT Next Visit Plan Progress mobility of knees, strengthening in [place], [facility], progress static and dynamic balance on non compliant surfaces.    PT Home Exercise Plan Access Code: [email or web address]    Consulted and Agree with Plan of Care Patient           Patient will benefit from skilled therapeutic intervention in order to improve the following deficits and impairments:  Postural dysfunction, Decreased strength, Decreased mobility, Increased edema, Decreased balance, Impaired flexibility, Pain, Decreased activity tolerance, Obesity, Difficulty walking  Visit Diagnosis: Acute pain of left knee  Acute pain of right knee  Difficulty in walking, not elsewhere classified  Muscle weakness (generalized)  Localized edema     Problem List Patient Active Problem List   Diagnosis Date Noted  . Status post arthroscopy of  right knee 12/11/2019  . 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 Acute medial meniscal tear, right, subsequent encounter 08/27/2019  . History of total left knee replacement 07/16/2019  . Peroneal neuropathy at knee, left 06/25/2019  . Lumbosacral radiculopathy 06/07/2019  . 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 IDA (iron deficiency anemia) 10/16/2018  . OA (osteoarthritis) of knee 01/20/2018  . Acute meniscal tear, medial 07/17/2017  . 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 Insomnia 11/08/2016  . Pernicious anemia 11/08/2016  . Leukopenia 07/09/2016  . Hypocupremia 07/09/2016  . Palpitations 04/12/2016  . 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 Lap gastric bypass May 2016 07/06/2014  . History of Clostridium difficile infection 08/12/2011  . Postoperative hypothyroidism 08/26/2010  . Gastroesophageal reflux disease 08/26/2010  . Vitamin D deficiency 08/26/2010  . Fibrocystic disease of breast 08/26/2010  . Cobalamin deficiency 08/26/2010  . Obesity 03/20/2010  . Backache 03/20/2010    Scot Jun, PT, DPT, OCS, ATC 01/27/20  8:51 AM    [facility] [address] [place], [place], [address] Phone: [phone]   Fax:  [phone]  Name: Victoria [name] MRN: [id number] Date of Birth: [birth year]

## 2020-01-27 NOTE — Telephone Encounter (Signed)
Faxed to [facility]

## 2020-01-28 ENCOUNTER — Encounter: Payer: Self-pay | Admitting: Orthopaedic Surgery

## 2020-01-29 ENCOUNTER — Other Ambulatory Visit (HOSPITAL_COMMUNITY): Payer: Self-pay | Admitting: Internal Medicine

## 2020-01-29 MED FILL — SHINGRIX 50 MCG SUS: 50 | 1 days supply | Qty: 1 | Fill #0

## 2020-02-01 ENCOUNTER — Other Ambulatory Visit: Payer: Self-pay

## 2020-02-01 ENCOUNTER — Ambulatory Visit (INDEPENDENT_AMBULATORY_CARE_PROVIDER_SITE_OTHER): Payer: No Typology Code available for payment source | Admitting: Rehabilitative and Restorative Service Providers"

## 2020-02-01 ENCOUNTER — Encounter: Payer: Self-pay | Admitting: Rehabilitative and Restorative Service Providers"

## 2020-02-01 DIAGNOSIS — M6281 Muscle weakness (generalized): Secondary | ICD-10-CM

## 2020-02-01 DIAGNOSIS — R262 Difficulty in walking, not elsewhere classified: Secondary | ICD-10-CM

## 2020-02-01 DIAGNOSIS — M25562 Pain in left knee: Secondary | ICD-10-CM

## 2020-02-01 DIAGNOSIS — R600 Localized edema: Secondary | ICD-10-CM

## 2020-02-01 DIAGNOSIS — M25561 Pain in right knee: Secondary | ICD-10-CM | POA: Diagnosis not present

## 2020-02-01 NOTE — Therapy (Signed)
[facility] [address] [place], [place], [address] Phone: [phone]   Fax:  [phone]  Physical Therapy Treatment  Patient Details  Name: Victoria [name] MRN: [id number] Date of Birth: [birth year] Referring Provider (PT): Jean Rosenthal, MD   Encounter Date: 02/01/2020   PT End of Session - 02/01/20 0808    Visit Number 11    Number of Visits 22    Date for PT Re-Evaluation 04/01/20    PT Start Time 0800    PT Stop Time 0840    PT Time Calculation (min) 40 min    Activity Tolerance Patient limited by pain    Behavior During Therapy [facility] for tasks assessed/performed           Past Medical History:  Diagnosis Date  . Bursitis    right shoulder  . Chronic leukopenia    intermittant since 2010, followed by Dr. Renold Genta now  . Degenerative joint disease of low back 09/02/15    Dr. Maureen Ralphs  . Fibrocystic breast   . GERD (gastroesophageal reflux disease)   . Headache    per pt more stress/tension  . Heart palpitations    SEE EPIC ENCOUTNER , CARDIOLOGY DR. Tressia Miners TURNER 2018; reports on 05-16-17 " i haven't had any bouts of those lately"    . Hemorrhoids   . History of Clostridium difficile infection 12/2010  . History of exercise intolerance    ETT on 04-27-2016-- negative ([facility] treadmill score 7)  . History of Helicobacter pylori infection 2001 and 09/ 2012  . HSV-2 infection    genital  . Hx of adenomatous colonic polyps 10/17/2007  . Hydradenitis    per pt currently treated with Humira injection  . Hypothyroidism, postsurgical 1980  . Medial meniscus tear    right knee  . Osteoarthritis   . Pernicious anemia    b12 def  . PONV (postoperative nausea and vomiting)   . 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 Tendonitis    right shoulder  . Varicose veins   . Vitamin B 12 deficiency   . Vitamin D deficiency     Past  Surgical History:  Procedure Laterality Date  . BREATH TEK H PYLORI N/A 05/03/2014   Procedure: BREATH TEK H PYLORI;  Surgeon: Kaylyn Lim, MD;  [facility];  Service: General;  Laterality: N/A;  . CARDIOVASCULAR STRESS TEST  03/11/2013   dr Tressia Miners turner   Low risk nuclear study w/ a very small anterior perfusion defect that most likely represents breast attenuation artifact, less likely this could represent a true perfusion abnormality in the distribution of diagonal branch of the LAD/  normal LV function and wall motion , ef 66%  . CATARACT EXTRACTION W/ INTRAOCULAR LENS  IMPLANT, BILATERAL  10/2017  . CESAREAN SECTION  1985  . CHOLECYSTECTOMY OPEN  1987  . COLONOSCOPY  02/2017   Dr [name] ; per patient they found 7 polyps with polypectomy; on 5 year track   . FINE NEEDLE ASPIRATION Left 05/22/2017   Procedure: LEFT KNEE ASPIRATION;  Surgeon: Gaynelle Arabian, MD;  [facility];  Service: Orthopedics;  Laterality: Left;  Marland Kitchen GASTRIC ROUX-EN-Y N/A 07/06/2014   Procedure: LAPAROSCOPIC ROUX-EN-Y GASTRIC BYPASS, ENTEROLYSIS OF ADHESIONS WITH UPPER ENDOSCOPY;  Surgeon: Johnathan Hausen, MD;  [facility];  Service: General;  Laterality: N/A;  . HAMMER TOE SURGERY Bilateral 02/17/2008   bilateral foot digit's 2  and 5  . HIATAL HERNIA REPAIR N/A 07/06/2014   Procedure: LAPAROSCOPIC REPAIR OF HIATAL HERNIA;  Surgeon: Johnathan Hausen, MD;  [facility];  Service: General;  Laterality: N/A;  . I & D KNEE WITH POLY EXCHANGE Left 12/11/2019   Procedure: LEFT KNEE POLY-LINER EXCHANGE;  Surgeon: Mcarthur Rossetti, MD;  Location: [place];  Service: Orthopedics;  Laterality: Left;  . KNEE ARTHROSCOPY Right 12/11/2019   Procedure: RIGHT KNEE ARTHROSCOPY WITH PARTIAL MEDIAL MENISCECTOMY;  Surgeon: Mcarthur Rossetti, MD;  Location: [place];  Service: Orthopedics;  Laterality: Right;  . KNEE ARTHROSCOPY WITH MEDIAL MENISECTOMY Left 07/17/2017   Procedure: LEFT KNEE ARTHROSCOPY WITH MEDIAL  LATERAL MENISECTOMY;  Surgeon: Gaynelle Arabian, MD;  [facility];  Service: Orthopedics;  Laterality: Left;  Marland Kitchen MASS EXCISION N/A 10/11/2016   Procedure: EXCISION OF PERIANAL MASS;  Surgeon: [name], MD;  Location: [place];  Service: General;  Laterality: N/A;  . REFRACTIVE SURGERY    . THYROIDECTOMY  1980  . TOTAL ABDOMINAL HYSTERECTOMY  08-25-2002   dr Margaretha Glassing   ovaries retained  . TOTAL HIP ARTHROPLASTY Right 05/21/2012   Procedure: TOTAL HIP ARTHROPLASTY;  Surgeon: Gearlean Alf, MD;  [facility];  Service: Orthopedics;  Laterality: Right;  . TOTAL HIP ARTHROPLASTY Left 01-31-2009  dr Wynelle Link  . TOTAL HIP REVISION Left 05/22/2017   Procedure: Left hip bearing surface and head revision;  Surgeon: Gaynelle Arabian, MD;  [facility];  Service: Orthopedics;  Laterality: Left;  . TOTAL KNEE ARTHROPLASTY Left 01/20/2018   Procedure: LEFT TOTAL KNEE ARTHROPLASTY;  Surgeon: Gaynelle Arabian, MD;  [facility];  Service: Orthopedics;  Laterality: Left;  107min  . TRANSTHORACIC ECHOCARDIOGRAM  05/03/2016   ef 55-60%/  trivial MR/  mild TR  . TUBAL LIGATION      There were no vitals filed for this visit.   Subjective Assessment - 02/01/20 0806    Subjective Pt. stated feeling aching in Rt upon arrival and some pulling in Lt.  Lt 6/10, Rt 5/10.  Pulling was felt while doing household stuff.    Pertinent History Bilateral THA, L TKA, tendonitis, R shoulder bursitis, DJD back 2017, GERD, heart palpitations, OA, PONV, vit D deficiency, bit B12 deficiency    Diagnostic tests MRI    Patient Stated Goals walk without walker, I want to get back to work    Currently in Pain? Yes    Pain Score --   5-6/10   Pain Location Knee    Pain Orientation Left;Right    Pain Descriptors / Indicators Sore;Aching    Pain Type Chronic pain    Pain Onset More than a month ago    Pain Frequency Intermittent    Aggravating Factors  housework                              [facility] Adult PT Treatment/Exercise - 02/01/20 0001      Neuro Re-ed    Neuro Re-ed Details  feet together stance on foam 1 min, on foam anterior/posterior weight shifts 30x      Knee/Hip Exercises: Stretches   Gastroc Stretch 30 seconds;3 reps;Both   incline board     Knee/Hip Exercises: Aerobic   Nustep Lvl 6 10 mins      Knee/Hip Exercises: Machines for Strengthening   Total Gym Leg Press SL 3 x 10 bilateral 37 lbs performed bilateral  Knee/Hip Exercises: Seated   Other Seated Knee/Hip Exercises blue band hamstring curl 3 x10 bilateral      Manual Therapy   Manual therapy comments seated MWM flexion, distraction, IR on Lt LE                    PT Short Term Goals - 01/27/20 0849      PT SHORT TERM GOAL #1   Title Pt. will demonstrate bilateral knee flexion AROM > 110 degreess for functional mobility in squats, walking, stairs.    Time 4    Period Weeks    Status New    Target Date 02/24/20             PT Long Term Goals - 01/27/20 0847      PT LONG TERM GOAL #1   Title Pt will be indpedent in her HEP and porgression.    Time 8    Period Weeks    Status Revised    Target Date 03/23/20      PT LONG TERM GOAL #2   Title Pt will be able to amb without an assistive device on communtiy surfaces.    Time 8    Status Revised    Target Date 03/23/20      PT LONG TERM GOAL #3   Title Pt will improve her bilateral LE strength to >/= +4/5 in order to improve fucntional mobility.    Time 8    Period Weeks    Status Revised    Target Date 03/23/20      PT LONG TERM GOAL #4   Title Pt will be able to perform sit to stand in </= 20 seconds.    Time 8    Status Revised    Target Date 03/23/20      PT LONG TERM GOAL #5   Title Pt will be able to navigate 5 steps with rail with step over step pattern.    Time 8    Period Weeks    Status Revised    Target Date 03/23/20      PT LONG TERM GOAL #6   Title Pt will  improve her FOTO score from 67% limitation to </= 52% limitation.    Time 8    Period Weeks    Status Revised    Target Date 03/23/20                 Plan - 02/01/20 0835    Clinical Impression Statement Lt knee flexion resistance still noted but progress noted in manual intervention c reduced end range symptoms.  Pt. continued to express complaints of bilateral knee pain with standing/WB pressure but improved in ability to tolerate in WB activity such as balance and step training.    Personal Factors and Comorbidities Comorbidity 3+    Comorbidities bilateral hip replacement, L TKA 2019, R shoulder bursitis, tendonitis, DJD back 2017, heart palpations, OA, vit B12-def, vit D def    Examination-Activity Limitations Transfers;Sit;Squat;Stairs;Other;Stand    Examination-Participation Restrictions Community Activity;Other;Laundry;Occupation    Stability/Clinical Decision Making Evolving/Moderate complexity    Rehab Potential Good    PT Frequency Other (comment)   1-2x/week   PT Duration 8 weeks    PT Treatment/Interventions ADLs/Self Care Home Management;Cryotherapy;Electrical Stimulation;Gait training;Stair training;Functional mobility training;Therapeutic activities;Therapeutic exercise;Balance training;Patient/family education;Manual techniques;Taping;Passive range of motion;DME Instruction;Moist Heat    PT Next Visit Plan Progress mobility of knees, strengthening in OKC, CKC, progress static and dynamic balance on non  compliant surfaces.    PT Home Exercise Plan Access Code: [email or web address]    Consulted and Agree with Plan of Care Patient           Patient will benefit from skilled therapeutic intervention in order to improve the following deficits and impairments:  Postural dysfunction, Decreased strength, Decreased mobility, Increased edema, Decreased balance, Impaired flexibility, Pain, Decreased activity tolerance, Obesity, Difficulty walking  Visit Diagnosis: Acute pain of left  knee  Acute pain of right knee  Difficulty in walking, not elsewhere classified  Muscle weakness (generalized)  Localized edema     Problem List Patient Active Problem List   Diagnosis Date Noted  . Status post arthroscopy of right knee 12/11/2019  . 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 Acute medial meniscal tear, right, subsequent encounter 08/27/2019  . History of total left knee replacement 07/16/2019  . Peroneal neuropathy at knee, left 06/25/2019  . Lumbosacral radiculopathy 06/07/2019  . 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 IDA (iron deficiency anemia) 10/16/2018  . OA (osteoarthritis) of knee 01/20/2018  . Acute meniscal tear, medial 07/17/2017  . 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 Insomnia 11/08/2016  . Pernicious anemia 11/08/2016  . Leukopenia 07/09/2016  . Hypocupremia 07/09/2016  . Palpitations 04/12/2016  . 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 Lap gastric bypass May 2016 07/06/2014  . History of Clostridium difficile infection 08/12/2011  . Postoperative hypothyroidism 08/26/2010  . Gastroesophageal reflux disease 08/26/2010  . Vitamin D deficiency 08/26/2010  . Fibrocystic disease of breast 08/26/2010  . Cobalamin deficiency 08/26/2010  . Obesity 03/20/2010  . Backache 03/20/2010    Scot Jun, PT, DPT, OCS, ATC 02/01/20  8:39 AM    [facility] Physical Therapy [address] [place], [place], [address] Phone: [phone]   Fax:  [phone]  Name: Victoria [name] MRN: [id number] Date of Birth: [birth year]

## 2020-02-03 ENCOUNTER — Other Ambulatory Visit: Payer: Self-pay

## 2020-02-03 ENCOUNTER — Encounter: Payer: Self-pay | Admitting: Physical Therapy

## 2020-02-03 ENCOUNTER — Ambulatory Visit (INDEPENDENT_AMBULATORY_CARE_PROVIDER_SITE_OTHER): Payer: No Typology Code available for payment source | Admitting: Physical Therapy

## 2020-02-03 DIAGNOSIS — M25562 Pain in left knee: Secondary | ICD-10-CM

## 2020-02-03 DIAGNOSIS — M25561 Pain in right knee: Secondary | ICD-10-CM

## 2020-02-03 DIAGNOSIS — M6281 Muscle weakness (generalized): Secondary | ICD-10-CM

## 2020-02-03 DIAGNOSIS — R262 Difficulty in walking, not elsewhere classified: Secondary | ICD-10-CM | POA: Diagnosis not present

## 2020-02-03 DIAGNOSIS — R600 Localized edema: Secondary | ICD-10-CM

## 2020-02-03 NOTE — Therapy (Signed)
[facility] Physical Therapy [address] [place], [place], [address] Phone: [phone]   Fax:  [phone]  Physical Therapy Treatment  Patient Details  Name: Victoria [name] MRN: [id number] Date of Birth: 1960-12-25 Referring Provider (PT): Jean Rosenthal, MD   Encounter Date: 02/03/2020   PT End of Session - 02/03/20 0806    Visit Number 12    Number of Visits 22    Date for PT Re-Evaluation 04/01/20    PT Start Time 0801    PT Stop Time 0842    PT Time Calculation (min) 41 min    Activity Tolerance Patient limited by pain    Behavior During Therapy [facility] for tasks assessed/performed           Past Medical History:  Diagnosis Date  . Bursitis    right shoulder  . Chronic leukopenia    intermittant since 2010, followed by Dr. Renold Genta now  . Degenerative joint disease of low back 09/02/15    Dr. Maureen Ralphs  . Fibrocystic breast   . GERD (gastroesophageal reflux disease)   . Headache    per pt more stress/tension  . Heart palpitations    SEE EPIC ENCOUTNER , CARDIOLOGY DR. Tressia Miners TURNER 2018; reports on 05-16-17 " i haven't had any bouts of those lately"    . Hemorrhoids   . History of Clostridium difficile infection 12/2010  . History of exercise intolerance    ETT on 04-27-2016-- negative ([facility] treadmill score 7)  . History of Helicobacter pylori infection 2001 and 09/ 2012  . HSV-2 infection    genital  . Hx of adenomatous colonic polyps 10/17/2007  . Hydradenitis    per pt currently treated with Humira injection  . Hypothyroidism, postsurgical 1980  . Medial meniscus tear    right knee  . Osteoarthritis   . Pernicious anemia    b12 def  . PONV (postoperative nausea and vomiting)   . 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 Tendonitis    right shoulder  . Varicose veins   . Vitamin B 12 deficiency   . Vitamin D deficiency     Past  Surgical History:  Procedure Laterality Date  . BREATH TEK H PYLORI N/A 05/03/2014   Procedure: BREATH TEK H PYLORI;  Surgeon: Kaylyn Lim, MD;  [facility];  Service: General;  Laterality: N/A;  . CARDIOVASCULAR STRESS TEST  03/11/2013   dr Tressia Miners turner   Low risk nuclear study w/ a very small anterior perfusion defect that most likely represents breast attenuation artifact, less likely this could represent a true perfusion abnormality in the distribution of diagonal branch of the LAD/  normal LV function and wall motion , ef 66%  . CATARACT EXTRACTION W/ INTRAOCULAR LENS  IMPLANT, BILATERAL  10/2017  . CESAREAN SECTION  1985  . CHOLECYSTECTOMY OPEN  1987  . COLONOSCOPY  02/2017   Dr [name] ; per patient they found 7 polyps with polypectomy; on 5 year track   . FINE NEEDLE ASPIRATION Left 05/22/2017   Procedure: LEFT KNEE ASPIRATION;  Surgeon: Gaynelle Arabian, MD;  [facility];  Service: Orthopedics;  Laterality: Left;  Marland Kitchen GASTRIC ROUX-EN-Y N/A 07/06/2014   Procedure: LAPAROSCOPIC ROUX-EN-Y GASTRIC BYPASS, ENTEROLYSIS OF ADHESIONS WITH UPPER ENDOSCOPY;  Surgeon: Johnathan Hausen, MD;  [facility];  Service: General;  Laterality: N/A;  . HAMMER TOE SURGERY Bilateral 02/17/2008   bilateral foot digit's 2  and 5  . HIATAL HERNIA REPAIR N/A 07/06/2014   Procedure: LAPAROSCOPIC REPAIR OF HIATAL HERNIA;  Surgeon: Johnathan Hausen, MD;  [facility];  Service: General;  Laterality: N/A;  . I & D KNEE WITH POLY EXCHANGE Left 12/11/2019   Procedure: LEFT KNEE POLY-LINER EXCHANGE;  Surgeon: Mcarthur Rossetti, MD;  Location: [place];  Service: Orthopedics;  Laterality: Left;  . KNEE ARTHROSCOPY Right 12/11/2019   Procedure: RIGHT KNEE ARTHROSCOPY WITH PARTIAL MEDIAL MENISCECTOMY;  Surgeon: Mcarthur Rossetti, MD;  Location: [place];  Service: Orthopedics;  Laterality: Right;  . KNEE ARTHROSCOPY WITH MEDIAL MENISECTOMY Left 07/17/2017   Procedure: LEFT KNEE ARTHROSCOPY WITH MEDIAL  LATERAL MENISECTOMY;  Surgeon: Gaynelle Arabian, MD;  [facility];  Service: Orthopedics;  Laterality: Left;  Marland Kitchen MASS EXCISION N/A 10/11/2016   Procedure: EXCISION OF PERIANAL MASS;  Surgeon: [name], MD;  Location: [place];  Service: General;  Laterality: N/A;  . REFRACTIVE SURGERY    . THYROIDECTOMY  1980  . TOTAL ABDOMINAL HYSTERECTOMY  08-25-2002   dr Margaretha Glassing   ovaries retained  . TOTAL HIP ARTHROPLASTY Right 05/21/2012   Procedure: TOTAL HIP ARTHROPLASTY;  Surgeon: Gearlean Alf, MD;  [facility];  Service: Orthopedics;  Laterality: Right;  . TOTAL HIP ARTHROPLASTY Left 01-31-2009  dr Wynelle Link  . TOTAL HIP REVISION Left 05/22/2017   Procedure: Left hip bearing surface and head revision;  Surgeon: Gaynelle Arabian, MD;  [facility];  Service: Orthopedics;  Laterality: Left;  . TOTAL KNEE ARTHROPLASTY Left 01/20/2018   Procedure: LEFT TOTAL KNEE ARTHROPLASTY;  Surgeon: Gaynelle Arabian, MD;  [facility];  Service: Orthopedics;  Laterality: Left;  71min  . TRANSTHORACIC ECHOCARDIOGRAM  05/03/2016   ef 55-60%/  trivial MR/  mild TR  . TUBAL LIGATION      There were no vitals filed for this visit.   Subjective Assessment - 02/03/20 0805    Subjective Pt arriving to therapy reporting waking up this morning with both knees hurting. Pt reporting 7/10 pain.    Pertinent History Bilateral THA, L TKA, tendonitis, R shoulder bursitis, DJD back 2017, GERD, heart palpitations, OA, PONV, vit D deficiency, bit B12 deficiency    Diagnostic tests MRI    Patient Stated Goals walk without walker, I want to get back to work    Currently in Pain? Yes    Pain Score 7     Pain Location Knee    Pain Orientation Left;Right    Pain Descriptors / Indicators Aching;Sore;Tightness    Pain Type Chronic pain              [facility] PT Assessment - 02/03/20 0001      Assessment   Medical Diagnosis R knee arthroscopy partial medial meniscectomy, L TKA revision      Referring Provider (PT) Jean Rosenthal, MD    Onset Date/Surgical Date 12/11/19      AROM   Right Knee Extension 0    Right Knee Flexion 120    Left Knee Extension 0    Left Knee Flexion 90      PROM   Right Knee Extension 0    Right Knee Flexion 124    Left Knee Extension 0    Left Knee Flexion 92      Transfers   Five time sit to stand comments  16.8 seconds with UE support from standard height chair with arm rests  [facility] Adult PT Treatment/Exercise - 02/03/20 0001      Ambulation/Gait   Gait Comments Rolling walker with antalgic gait pattern      Knee/Hip Exercises: Stretches   Gastroc Stretch 30 seconds;3 reps;Both   incline board     Knee/Hip Exercises: Aerobic   Recumbent Bike seat 9, 7 minutes L2      Knee/Hip Exercises: Machines for Strengthening   Total Gym Leg Press 37# single LE, 3x10      Knee/Hip Exercises: Standing   Forward Step Up 10 reps;Both;Step Height: 4"      Knee/Hip Exercises: Seated   Other Seated Knee/Hip Exercises blue band hamstring curl 3 x10 bilateral    Sit to Sand 10 reps;with UE support;Other (comment)      Manual Therapy   Manual therapy comments PROM flexion in tailgait position                    PT Short Term Goals - 01/27/20 0849      PT SHORT TERM GOAL #1   Title Pt. will demonstrate bilateral knee flexion AROM > 110 degreess for functional mobility in squats, walking, stairs.    Time 4    Period Weeks    Status New    Target Date 02/24/20             PT Long Term Goals - 02/03/20 0806      PT LONG TERM GOAL #1   Title Pt will be indpedent in her HEP and porgression.    Status On-going      PT LONG TERM GOAL #2   Title Pt will be able to amb without an assistive device on communtiy surfaces.    Status On-going      PT LONG TERM GOAL #3   Title Pt will improve her bilateral LE strength to >/= +4/5 in order to improve fucntional mobility.    Status On-going       PT LONG TERM GOAL #4   Title Pt will be able to perform sit to stand in </= 20 seconds.      PT LONG TERM GOAL #5   Title Pt will be able to navigate 5 steps with rail with step over step pattern.    Status On-going      PT LONG TERM GOAL #6   Title Pt will improve her FOTO score from 67% limitation to </= 52% limitation.    Status On-going                 Plan - 02/03/20 2683    Clinical Impression Statement Pt reporting increased pain today of 7/10 in bilateral knees. Pt with AROM of 90 degrees in L knee flexion, Pt with PROM of 92, limited by pain in Left knee. Pt reporting her R knee's pain is worse than left. Pt was edu in placing less weight on her UE's when amb using her RW. Tolerating 6inch step ups with single UE support today. Continue skilled PT    Personal Factors and Comorbidities Comorbidity 3+    Comorbidities bilateral hip replacement, L TKA 2019, R shoulder bursitis, tendonitis, DJD back 2017, heart palpations, OA, vit B12-def, vit D def    Examination-Activity Limitations Transfers;Sit;Squat;Stairs;Other;Stand    Examination-Participation Restrictions Community Activity;Other;Laundry;Occupation    Stability/Clinical Decision Making Evolving/Moderate complexity    Rehab Potential Good    PT Frequency Other (comment)    PT Duration 8 weeks    PT Treatment/Interventions ADLs/Self Care Home  Management;Cryotherapy;Electrical Stimulation;Gait training;Stair training;Functional mobility training;Therapeutic activities;Therapeutic exercise;Balance training;Patient/family education;Manual techniques;Taping;Passive range of motion;DME Instruction;Moist Heat    PT Next Visit Plan Progress mobility of knees, strengthening in OKC, CKC, progress static and dynamic balance on non compliant surfaces.    PT Home Exercise Plan Access Code: [email or web address]    Consulted and Agree with Plan of Care Patient           Patient will benefit from skilled therapeutic intervention in  order to improve the following deficits and impairments:  Postural dysfunction, Decreased strength, Decreased mobility, Increased edema, Decreased balance, Impaired flexibility, Pain, Decreased activity tolerance, Obesity, Difficulty walking  Visit Diagnosis: Acute pain of left knee  Acute pain of right knee  Difficulty in walking, not elsewhere classified  Muscle weakness (generalized)  Localized edema     Problem List Patient Active Problem List   Diagnosis Date Noted  . Status post arthroscopy of right knee 12/11/2019  . 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 Acute medial meniscal tear, right, subsequent encounter 08/27/2019  . History of total left knee replacement 07/16/2019  . Peroneal neuropathy at knee, left 06/25/2019  . Lumbosacral radiculopathy 06/07/2019  . 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 IDA (iron deficiency anemia) 10/16/2018  . OA (osteoarthritis) of knee 01/20/2018  . Acute meniscal tear, medial 07/17/2017  . 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 Insomnia 11/08/2016  . Pernicious anemia 11/08/2016  . Leukopenia 07/09/2016  . Hypocupremia 07/09/2016  . Palpitations 04/12/2016  . 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 Lap gastric bypass May 2016 07/06/2014  . History of Clostridium difficile infection 08/12/2011  . Postoperative hypothyroidism 08/26/2010  . Gastroesophageal reflux disease 08/26/2010  . Vitamin D deficiency 08/26/2010  . Fibrocystic disease of breast 08/26/2010  . Cobalamin deficiency 08/26/2010  . Obesity 03/20/2010  . Backache 03/20/2010    Oretha Caprice, PT, MPT 02/03/2020, 8:37 AM  [facility] Physical Therapy [address] [place], [place], [address] Phone: [phone]   Fax:  [phone]  Name: Victoria [name] MRN: [id number] Date of Birth: 05-Nov-1960

## 2020-02-08 ENCOUNTER — Encounter: Payer: No Typology Code available for payment source | Admitting: Rehabilitative and Restorative Service Providers"

## 2020-02-08 ENCOUNTER — Other Ambulatory Visit: Payer: Self-pay | Admitting: Orthopaedic Surgery

## 2020-02-08 ENCOUNTER — Ambulatory Visit (INDEPENDENT_AMBULATORY_CARE_PROVIDER_SITE_OTHER): Payer: No Typology Code available for payment source | Admitting: Rehabilitative and Restorative Service Providers"

## 2020-02-08 ENCOUNTER — Encounter: Payer: Self-pay | Admitting: Rehabilitative and Restorative Service Providers"

## 2020-02-08 ENCOUNTER — Other Ambulatory Visit: Payer: Self-pay

## 2020-02-08 DIAGNOSIS — M25562 Pain in left knee: Secondary | ICD-10-CM | POA: Diagnosis not present

## 2020-02-08 DIAGNOSIS — M25561 Pain in right knee: Secondary | ICD-10-CM | POA: Diagnosis not present

## 2020-02-08 DIAGNOSIS — R262 Difficulty in walking, not elsewhere classified: Secondary | ICD-10-CM | POA: Diagnosis not present

## 2020-02-08 DIAGNOSIS — R600 Localized edema: Secondary | ICD-10-CM

## 2020-02-08 DIAGNOSIS — M6281 Muscle weakness (generalized): Secondary | ICD-10-CM

## 2020-02-08 MED FILL — tiZANidine HCL 4 MG TABS: 4 | 13 days supply | Qty: 40 | Fill #0

## 2020-02-08 NOTE — Therapy (Signed)
[facility] [address] [place], [place], [address] Phone: [phone]   Fax:  [phone]  Physical Therapy Treatment  Patient Details  Name: Victoria [name] MRN: [id number] Date of Birth: December 11, 1960 Referring Provider (PT): Jean Rosenthal, MD   Encounter Date: 02/08/2020   PT End of Session - 02/08/20 1342    Visit Number 13    Number of Visits 22    Date for PT Re-Evaluation 04/01/20    PT Start Time 1344    PT Stop Time 1423    PT Time Calculation (min) 39 min    Activity Tolerance Patient tolerated treatment well    Behavior During Therapy [facility] for tasks assessed/performed           Past Medical History:  Diagnosis Date  . Bursitis    right shoulder  . Chronic leukopenia    intermittant since 2010, followed by Dr. Renold Genta now  . Degenerative joint disease of low back 09/02/15    Dr. Maureen Ralphs  . Fibrocystic breast   . GERD (gastroesophageal reflux disease)   . Headache    per pt more stress/tension  . Heart palpitations    SEE EPIC ENCOUTNER , CARDIOLOGY DR. Tressia Miners TURNER 2018; reports on 05-16-17 " i haven't had any bouts of those lately"    . Hemorrhoids   . History of Clostridium difficile infection 12/2010  . History of exercise intolerance    ETT on 04-27-2016-- negative ([facility] treadmill score 7)  . History of Helicobacter pylori infection 2001 and 09/ 2012  . HSV-2 infection    genital  . Hx of adenomatous colonic polyps 10/17/2007  . Hydradenitis    per pt currently treated with Humira injection  . Hypothyroidism, postsurgical 1980  . Medial meniscus tear    right knee  . Osteoarthritis   . Pernicious anemia    b12 def  . PONV (postoperative nausea and vomiting)   . 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 Tendonitis    right shoulder  . Varicose veins   . Vitamin B 12 deficiency   . Vitamin D deficiency      Past Surgical History:  Procedure Laterality Date  . BREATH TEK H PYLORI N/A 05/03/2014   Procedure: BREATH TEK H PYLORI;  Surgeon: Kaylyn Lim, MD;  [facility];  Service: General;  Laterality: N/A;  . CARDIOVASCULAR STRESS TEST  03/11/2013   dr Tressia Miners turner   Low risk nuclear study w/ a very small anterior perfusion defect that most likely represents breast attenuation artifact, less likely this could represent a true perfusion abnormality in the distribution of diagonal branch of the LAD/  normal LV function and wall motion , ef 66%  . CATARACT EXTRACTION W/ INTRAOCULAR LENS  IMPLANT, BILATERAL  10/2017  . CESAREAN SECTION  1985  . CHOLECYSTECTOMY OPEN  1987  . COLONOSCOPY  02/2017   Dr [name] ; per patient they found 7 polyps with polypectomy; on 5 year track   . FINE NEEDLE ASPIRATION Left 05/22/2017   Procedure: LEFT KNEE ASPIRATION;  Surgeon: Gaynelle Arabian, MD;  [facility];  Service: Orthopedics;  Laterality: Left;  Marland Kitchen GASTRIC ROUX-EN-Y N/A 07/06/2014   Procedure: LAPAROSCOPIC ROUX-EN-Y GASTRIC BYPASS, ENTEROLYSIS OF ADHESIONS WITH UPPER ENDOSCOPY;  Surgeon: Johnathan Hausen, MD;  [facility];  Service: General;  Laterality: N/A;  . HAMMER TOE SURGERY Bilateral 02/17/2008   bilateral foot digit's 2  and 5  . HIATAL HERNIA REPAIR N/A 07/06/2014   Procedure: LAPAROSCOPIC REPAIR OF HIATAL HERNIA;  Surgeon: Johnathan Hausen, MD;  [facility];  Service: General;  Laterality: N/A;  . I & D KNEE WITH POLY EXCHANGE Left 12/11/2019   Procedure: LEFT KNEE POLY-LINER EXCHANGE;  Surgeon: Mcarthur Rossetti, MD;  Location: [place];  Service: Orthopedics;  Laterality: Left;  . KNEE ARTHROSCOPY Right 12/11/2019   Procedure: RIGHT KNEE ARTHROSCOPY WITH PARTIAL MEDIAL MENISCECTOMY;  Surgeon: Mcarthur Rossetti, MD;  Location: [place];  Service: Orthopedics;  Laterality: Right;  . KNEE ARTHROSCOPY WITH MEDIAL MENISECTOMY Left 07/17/2017   Procedure: LEFT KNEE ARTHROSCOPY WITH  MEDIAL LATERAL MENISECTOMY;  Surgeon: Gaynelle Arabian, MD;  [facility];  Service: Orthopedics;  Laterality: Left;  Marland Kitchen MASS EXCISION N/A 10/11/2016   Procedure: EXCISION OF PERIANAL MASS;  Surgeon: [name], MD;  Location: [place];  Service: General;  Laterality: N/A;  . REFRACTIVE SURGERY    . THYROIDECTOMY  1980  . TOTAL ABDOMINAL HYSTERECTOMY  08-25-2002   dr Margaretha Glassing   ovaries retained  . TOTAL HIP ARTHROPLASTY Right 05/21/2012   Procedure: TOTAL HIP ARTHROPLASTY;  Surgeon: Gearlean Alf, MD;  [facility];  Service: Orthopedics;  Laterality: Right;  . TOTAL HIP ARTHROPLASTY Left 01-31-2009  dr Wynelle Link  . TOTAL HIP REVISION Left 05/22/2017   Procedure: Left hip bearing surface and head revision;  Surgeon: Gaynelle Arabian, MD;  [facility];  Service: Orthopedics;  Laterality: Left;  . TOTAL KNEE ARTHROPLASTY Left 01/20/2018   Procedure: LEFT TOTAL KNEE ARTHROPLASTY;  Surgeon: Gaynelle Arabian, MD;  [facility];  Service: Orthopedics;  Laterality: Left;  85min  . TRANSTHORACIC ECHOCARDIOGRAM  05/03/2016   ef 55-60%/  trivial MR/  mild TR  . TUBAL LIGATION      There were no vitals filed for this visit.                      [place] Adult PT Treatment/Exercise - 02/08/20 0001      Ambulation/Gait   Gait Comments SPC in Lt UE within clinic      Neuro Re-ed    Neuro Re-ed Details  retro step 20 x each LE      Knee/Hip Exercises: Aerobic   Recumbent Bike seat 8, lvl 2 6 mins      Knee/Hip Exercises: Machines for Strengthening   Total Gym Leg Press 37 lbs 3 x 10 SL, performed bilateral      Knee/Hip Exercises: Seated   Other Seated Knee/Hip Exercises blue band hamstring curl 3 x10 bilateral    Other Seated Knee/Hip Exercises SLR 3 x 10 bilateral    Sit to Sand without UE support;2 sets;10 reps   20 inch table     Manual Therapy   Manual therapy comments seated flexion, distraction, IR Lt knee c percussive device Lt quad                     PT Short Term Goals - 01/27/20 0849      PT SHORT TERM GOAL #1   Title Pt. will demonstrate bilateral knee flexion AROM > 110 degreess for functional mobility in squats, walking, stairs.    Time 4    Period Weeks    Status New    Target Date 02/24/20             PT Long Term Goals - 02/03/20 6759  PT LONG TERM GOAL #1   Title Pt will be indpedent in her HEP and porgression.    Status On-going      PT LONG TERM GOAL #2   Title Pt will be able to amb without an assistive device on communtiy surfaces.    Status On-going      PT LONG TERM GOAL #3   Title Pt will improve her bilateral LE strength to >/= +4/5 in order to improve fucntional mobility.    Status On-going      PT LONG TERM GOAL #4   Title Pt will be able to perform sit to stand in </= 20 seconds.      PT LONG TERM GOAL #5   Title Pt will be able to navigate 5 steps with rail with step over step pattern.    Status On-going      PT LONG TERM GOAL #6   Title Pt will improve her FOTO score from 67% limitation to </= 52% limitation.    Status On-going                 Plan - 02/08/20 1412    Clinical Impression Statement Lt knee flexion improved past 90 deg passive today c reduced end range complaints c intervention in clinic today. SPC use in clinic in [place] c supervision c good overall performance.    Personal Factors and Comorbidities Comorbidity 3+    Comorbidities bilateral hip replacement, L TKA 2019, R shoulder bursitis, tendonitis, DJD back 2017, heart palpations, OA, vit B12-def, vit D def    Examination-Activity Limitations Transfers;Sit;Squat;Stairs;Other;Stand    Examination-Participation Restrictions Community Activity;Other;Laundry;Occupation    Stability/Clinical Decision Making Evolving/Moderate complexity    Rehab Potential Good    PT Frequency Other (comment)    PT Duration 8 weeks    PT Treatment/Interventions ADLs/Self Care Home  Management;Cryotherapy;Electrical Stimulation;Gait training;Stair training;Functional mobility training;Therapeutic activities;Therapeutic exercise;Balance training;Patient/family education;Manual techniques;Taping;Passive range of motion;DME Instruction;Moist Heat    PT Next Visit Plan Progress mobility of knees, strengthening in [place], CKC, progress static and dynamic balance on non compliant surfaces.    PT Home Exercise Plan Access Code: [email or web address]    Consulted and Agree with Plan of Care Patient           Patient will benefit from skilled therapeutic intervention in order to improve the following deficits and impairments:  Postural dysfunction,Decreased strength,Decreased mobility,Increased edema,Decreased balance,Impaired flexibility,Pain,Decreased activity tolerance,Obesity,Difficulty walking  Visit Diagnosis: Acute pain of left knee  Acute pain of right knee  Difficulty in walking, not elsewhere classified  Muscle weakness (generalized)  Localized edema     Problem List Patient Active Problem List   Diagnosis Date Noted  . Status post arthroscopy of right knee 12/11/2019  . 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 Acute medial meniscal tear, right, subsequent encounter 08/27/2019  . History of total left knee replacement 07/16/2019  . Peroneal neuropathy at knee, left 06/25/2019  . Lumbosacral radiculopathy 06/07/2019  . 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 IDA (iron deficiency anemia) 10/16/2018  . OA (osteoarthritis) of knee 01/20/2018  . Acute meniscal tear, medial 07/17/2017  . 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 Insomnia 11/08/2016  . Pernicious anemia 11/08/2016  . Leukopenia 07/09/2016  . Hypocupremia 07/09/2016  . Palpitations 04/12/2016  . 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 Lap gastric bypass May 2016 07/06/2014  . History  of Clostridium  difficile infection 08/12/2011  . Postoperative hypothyroidism 08/26/2010  . Gastroesophageal reflux disease 08/26/2010  . Vitamin D deficiency 08/26/2010  . Fibrocystic disease of breast 08/26/2010  . Cobalamin deficiency 08/26/2010  . Obesity 03/20/2010  . Backache 03/20/2010   Scot Jun, PT, DPT, OCS, ATC 02/08/20  2:20 PM     [facility] [address] [place], [place], [address] Phone: [phone]   Fax:  [phone]  Name: Victoria [name] MRN: [id number] Date of Birth: [birth year]

## 2020-02-08 NOTE — Telephone Encounter (Signed)
Ok to refill 

## 2020-02-10 ENCOUNTER — Encounter: Payer: Self-pay | Admitting: Rehabilitative and Restorative Service Providers"

## 2020-02-10 ENCOUNTER — Ambulatory Visit (INDEPENDENT_AMBULATORY_CARE_PROVIDER_SITE_OTHER): Payer: No Typology Code available for payment source | Admitting: Rehabilitative and Restorative Service Providers"

## 2020-02-10 ENCOUNTER — Other Ambulatory Visit: Payer: Self-pay

## 2020-02-10 DIAGNOSIS — M25561 Pain in right knee: Secondary | ICD-10-CM | POA: Diagnosis not present

## 2020-02-10 DIAGNOSIS — R600 Localized edema: Secondary | ICD-10-CM

## 2020-02-10 DIAGNOSIS — R262 Difficulty in walking, not elsewhere classified: Secondary | ICD-10-CM | POA: Diagnosis not present

## 2020-02-10 DIAGNOSIS — M25562 Pain in left knee: Secondary | ICD-10-CM | POA: Diagnosis not present

## 2020-02-10 DIAGNOSIS — M6281 Muscle weakness (generalized): Secondary | ICD-10-CM

## 2020-02-10 NOTE — Therapy (Signed)
[facility] [address] [place], [place], [address] Phone: [phone]   Fax:  [phone]  Physical Therapy Treatment  Patient Details  Name: Victoria [name] MRN: [id number] Date of Birth: Feb 18, 1961 Referring Provider (PT): Jean Rosenthal, MD   Encounter Date: 02/10/2020   PT End of Session - 02/10/20 0808    Visit Number 14    Number of Visits 22    Date for PT Re-Evaluation 04/01/20    Progress Note Due on Visit 20    PT Start Time 0801    PT Stop Time 0843    PT Time Calculation (min) 42 min    Activity Tolerance Patient tolerated treatment well    Behavior During Therapy [facility] for tasks assessed/performed           Past Medical History:  Diagnosis Date  . Bursitis    right shoulder  . Chronic leukopenia    intermittant since 2010, followed by Dr. Renold Genta now  . Degenerative joint disease of low back 09/02/15    Dr. Maureen Ralphs  . Fibrocystic breast   . GERD (gastroesophageal reflux disease)   . Headache    per pt more stress/tension  . Heart palpitations    SEE EPIC ENCOUTNER , CARDIOLOGY DR. Tressia Miners TURNER 2018; reports on 05-16-17 " i haven't had any bouts of those lately"    . Hemorrhoids   . History of Clostridium difficile infection 12/2010  . History of exercise intolerance    ETT on 04-27-2016-- negative ([facility] treadmill score 7)  . History of Helicobacter pylori infection 2001 and 09/ 2012  . HSV-2 infection    genital  . Hx of adenomatous colonic polyps 10/17/2007  . Hydradenitis    per pt currently treated with Humira injection  . Hypothyroidism, postsurgical 1980  . Medial meniscus tear    right knee  . Osteoarthritis   . Pernicious anemia    b12 def  . PONV (postoperative nausea and vomiting)   . 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 Tendonitis    right shoulder  . Varicose veins   . Vitamin B 12 deficiency    . Vitamin D deficiency     Past Surgical History:  Procedure Laterality Date  . BREATH TEK H PYLORI N/A 05/03/2014   Procedure: BREATH TEK H PYLORI;  Surgeon: Kaylyn Lim, MD;  [facility];  Service: General;  Laterality: N/A;  . CARDIOVASCULAR STRESS TEST  03/11/2013   dr Tressia Miners turner   Low risk nuclear study w/ a very small anterior perfusion defect that most likely represents breast attenuation artifact, less likely this could represent a true perfusion abnormality in the distribution of diagonal branch of the LAD/  normal LV function and wall motion , ef 66%  . CATARACT EXTRACTION W/ INTRAOCULAR LENS  IMPLANT, BILATERAL  10/2017  . CESAREAN SECTION  1985  . CHOLECYSTECTOMY OPEN  1987  . COLONOSCOPY  02/2017   Dr [name] ; per patient they found 7 polyps with polypectomy; on 5 year track   . FINE NEEDLE ASPIRATION Left 05/22/2017   Procedure: LEFT KNEE ASPIRATION;  Surgeon: Gaynelle Arabian, MD;  [facility];  Service: Orthopedics;  Laterality: Left;  Marland Kitchen GASTRIC ROUX-EN-Y N/A 07/06/2014   Procedure: LAPAROSCOPIC ROUX-EN-Y GASTRIC BYPASS, ENTEROLYSIS OF ADHESIONS WITH UPPER ENDOSCOPY;  Surgeon: Johnathan Hausen, MD;  [facility];  Service: General;  Laterality: N/A;  . HAMMER TOE  SURGERY Bilateral 02/17/2008   bilateral foot digit's 2 and 5  . HIATAL HERNIA REPAIR N/A 07/06/2014   Procedure: LAPAROSCOPIC REPAIR OF HIATAL HERNIA;  Surgeon: Johnathan Hausen, MD;  [facility];  Service: General;  Laterality: N/A;  . I & D KNEE WITH POLY EXCHANGE Left 12/11/2019   Procedure: LEFT KNEE POLY-LINER EXCHANGE;  Surgeon: Mcarthur Rossetti, MD;  Location: [place];  Service: Orthopedics;  Laterality: Left;  . KNEE ARTHROSCOPY Right 12/11/2019   Procedure: RIGHT KNEE ARTHROSCOPY WITH PARTIAL MEDIAL MENISCECTOMY;  Surgeon: Mcarthur Rossetti, MD;  Location: [place];  Service: Orthopedics;  Laterality: Right;  . KNEE ARTHROSCOPY WITH MEDIAL MENISECTOMY Left 07/17/2017   Procedure:  LEFT KNEE ARTHROSCOPY WITH MEDIAL LATERAL MENISECTOMY;  Surgeon: Gaynelle Arabian, MD;  [facility];  Service: Orthopedics;  Laterality: Left;  Marland Kitchen MASS EXCISION N/A 10/11/2016   Procedure: EXCISION OF PERIANAL MASS;  Surgeon: [name], MD;  Location: [place];  Service: General;  Laterality: N/A;  . REFRACTIVE SURGERY    . THYROIDECTOMY  1980  . TOTAL ABDOMINAL HYSTERECTOMY  08-25-2002   dr Margaretha Glassing   ovaries retained  . TOTAL HIP ARTHROPLASTY Right 05/21/2012   Procedure: TOTAL HIP ARTHROPLASTY;  Surgeon: Gearlean Alf, MD;  [facility];  Service: Orthopedics;  Laterality: Right;  . TOTAL HIP ARTHROPLASTY Left 01-31-2009  dr Wynelle Link  . TOTAL HIP REVISION Left 05/22/2017   Procedure: Left hip bearing surface and head revision;  Surgeon: Gaynelle Arabian, MD;  [facility];  Service: Orthopedics;  Laterality: Left;  . TOTAL KNEE ARTHROPLASTY Left 01/20/2018   Procedure: LEFT TOTAL KNEE ARTHROPLASTY;  Surgeon: Gaynelle Arabian, MD;  [facility];  Service: Orthopedics;  Laterality: Left;  25min  . TRANSTHORACIC ECHOCARDIOGRAM  05/03/2016   ef 55-60%/  trivial MR/  mild TR  . TUBAL LIGATION      There were no vitals filed for this visit.   Subjective Assessment - 02/10/20 0806    Subjective Pt. indicated 6/10 on Lt knee today, Rt 3/10.  Pt. indicated feeling like stiffness in morning is troublesome for getting moving.    Pertinent History Bilateral THA, L TKA, tendonitis, R shoulder bursitis, DJD back 2017, GERD, heart palpitations, OA, PONV, vit D deficiency, bit B12 deficiency    Diagnostic tests MRI    Patient Stated Goals walk without walker, I want to get back to work    Currently in Pain? Yes    Pain Score --   Lt 6/10, Rt 3/10   Pain Location Knee    Pain Orientation Left;Right    Pain Descriptors / Indicators Tightness;Aching;Sore    Pain Type Surgical pain    Aggravating Factors  increased standing activity, stiffness in morning.                              [place] Adult PT Treatment/Exercise - 02/10/20 0001      Neuro Re-ed    Neuro Re-ed Details  SLS c contralateral cone touch 3 anterior/medial/lateral x 10 bilateral c finger tip bilateral UE on bar, lateral stepping 3 cones x 6 each bilateral c CGA, tandem ambulation in // bars 10 ft x 6 fwd, reverse ambulation in // bars for improved WB acceptance 10 ft x 5      Knee/Hip Exercises: Aerobic   Recumbent Bike Lvl 3 10 mins seat 9      Knee/Hip Exercises: Machines for  Strengthening   Total Gym Leg Press 37 lbs 3 x 12 bilateral SL performed      Knee/Hip Exercises: Standing   Forward Step Up Step Height: 4";10 reps;Both;2 sets                    PT Short Term Goals - 01/27/20 0849      PT SHORT TERM GOAL #1   Title Pt. will demonstrate bilateral knee flexion AROM > 110 degreess for functional mobility in squats, walking, stairs.    Time 4    Period Weeks    Status New    Target Date 02/24/20             PT Long Term Goals - 02/03/20 0806      PT LONG TERM GOAL #1   Title Pt will be indpedent in her HEP and porgression.    Status On-going      PT LONG TERM GOAL #2   Title Pt will be able to amb without an assistive device on communtiy surfaces.    Status On-going      PT LONG TERM GOAL #3   Title Pt will improve her bilateral LE strength to >/= +4/5 in order to improve fucntional mobility.    Status On-going      PT LONG TERM GOAL #4   Title Pt will be able to perform sit to stand in </= 20 seconds.      PT LONG TERM GOAL #5   Title Pt will be able to navigate 5 steps with rail with step over step pattern.    Status On-going      PT LONG TERM GOAL #6   Title Pt will improve her FOTO score from 67% limitation to </= 52% limitation.    Status On-going                 Plan - 02/10/20 0836    Clinical Impression Statement Spent good portion of intervention today on loading in standing c directional changes,  balance challenges to improve stability and control to help transition to [facility] use.  Fair performance overall on non compliant surfaces but showing progress.    Personal Factors and Comorbidities Comorbidity 3+    Comorbidities bilateral hip replacement, L TKA 2019, R shoulder bursitis, tendonitis, DJD back 2017, heart palpations, OA, vit B12-def, vit D def    Examination-Activity Limitations Transfers;Sit;Squat;Stairs;Other;Stand    Examination-Participation Restrictions Community Activity;Other;Laundry;Occupation    Stability/Clinical Decision Making Evolving/Moderate complexity    Rehab Potential Good    PT Frequency Other (comment)    PT Duration 8 weeks    PT Treatment/Interventions ADLs/Self Care Home Management;Cryotherapy;Electrical Stimulation;Gait training;Stair training;Functional mobility training;Therapeutic activities;Therapeutic exercise;Balance training;Patient/family education;Manual techniques;Taping;Passive range of motion;DME Instruction;Moist Heat    PT Next Visit Plan Progress mobility of knees, strengthening in [place], CKC, progress static and dynamic balance on non compliant surfaces, then compliant    PT Home Exercise Plan Access Code: [email or web address]    Consulted and Agree with Plan of Care Patient           Patient will benefit from skilled therapeutic intervention in order to improve the following deficits and impairments:  Postural dysfunction,Decreased strength,Decreased mobility,Increased edema,Decreased balance,Impaired flexibility,Pain,Decreased activity tolerance,Obesity,Difficulty walking  Visit Diagnosis: Acute pain of left knee  Acute pain of right knee  Difficulty in walking, not elsewhere classified  Muscle weakness (generalized)  Localized edema     Problem List Patient Active Problem List   Diagnosis Date  Noted  . Status post arthroscopy of right knee 12/11/2019  . 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 Acute medial  meniscal tear, right, subsequent encounter 08/27/2019  . History of total left knee replacement 07/16/2019  . Peroneal neuropathy at knee, left 06/25/2019  . Lumbosacral radiculopathy 06/07/2019  . 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 IDA (iron deficiency anemia) 10/16/2018  . OA (osteoarthritis) of knee 01/20/2018  . Acute meniscal tear, medial 07/17/2017  . 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 Insomnia 11/08/2016  . Pernicious anemia 11/08/2016  . Leukopenia 07/09/2016  . Hypocupremia 07/09/2016  . Palpitations 04/12/2016  . 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 Lap gastric bypass May 2016 07/06/2014  . History of Clostridium difficile infection 08/12/2011  . Postoperative hypothyroidism 08/26/2010  . Gastroesophageal reflux disease 08/26/2010  . Vitamin D deficiency 08/26/2010  . Fibrocystic disease of breast 08/26/2010  . Cobalamin deficiency 08/26/2010  . Obesity 03/20/2010  . Backache 03/20/2010    Scot Jun, PT, DPT, OCS, ATC 02/10/20  8:38 AM    [facility] [address] [place], [place], [address] Phone: [phone]   Fax:  [phone]  Name: Victoria [name] MRN: [id number] Date of Birth: [birth year]

## 2020-02-15 ENCOUNTER — Ambulatory Visit (INDEPENDENT_AMBULATORY_CARE_PROVIDER_SITE_OTHER): Payer: No Typology Code available for payment source | Admitting: Physical Therapy

## 2020-02-15 ENCOUNTER — Other Ambulatory Visit: Payer: Self-pay

## 2020-02-15 DIAGNOSIS — M25562 Pain in left knee: Secondary | ICD-10-CM | POA: Diagnosis not present

## 2020-02-15 DIAGNOSIS — R262 Difficulty in walking, not elsewhere classified: Secondary | ICD-10-CM

## 2020-02-15 DIAGNOSIS — R600 Localized edema: Secondary | ICD-10-CM

## 2020-02-15 DIAGNOSIS — M25561 Pain in right knee: Secondary | ICD-10-CM | POA: Diagnosis not present

## 2020-02-15 DIAGNOSIS — M6281 Muscle weakness (generalized): Secondary | ICD-10-CM | POA: Diagnosis not present

## 2020-02-15 NOTE — Therapy (Signed)
[facility] Physical Therapy [address] [place], [place], [address] Phone: [phone]   Fax:  [phone]  Physical Therapy Treatment  Patient Details  Name: Victoria [name] MRN: [id number] Date of Birth: [birth year] Referring Provider (PT): Jean Rosenthal, MD   Encounter Date: 02/15/2020   PT End of Session - 02/15/20 0810    Visit Number 15    Number of Visits 22    Date for PT Re-Evaluation 04/01/20    Progress Note Due on Visit 20    PT Start Time 0802    PT Stop Time 0843    PT Time Calculation (min) 41 min    Activity Tolerance Patient tolerated treatment well    Behavior During Therapy [facility] for tasks assessed/performed           Past Medical History:  Diagnosis Date  . Bursitis    right shoulder  . Chronic leukopenia    intermittant since 2010, followed by Dr. Renold Genta now  . Degenerative joint disease of low back 09/02/15    Dr. Maureen Ralphs  . Fibrocystic breast   . GERD (gastroesophageal reflux disease)   . Headache    per pt more stress/tension  . Heart palpitations    SEE EPIC ENCOUTNER , CARDIOLOGY DR. Tressia Miners TURNER 2018; reports on 05-16-17 " i haven't had any bouts of those lately"    . Hemorrhoids   . History of Clostridium difficile infection 12/2010  . History of exercise intolerance    ETT on 04-27-2016-- negative ([facility] treadmill score 7)  . History of Helicobacter pylori infection 2001 and 09/ 2012  . HSV-2 infection    genital  . Hx of adenomatous colonic polyps 10/17/2007  . Hydradenitis    per pt currently treated with Humira injection  . Hypothyroidism, postsurgical 1980  . Medial meniscus tear    right knee  . Osteoarthritis   . Pernicious anemia    b12 def  . PONV (postoperative nausea and vomiting)   . 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 Tendonitis    right shoulder  . Varicose veins   . Vitamin B 12 deficiency    . Vitamin D deficiency     Past Surgical History:  Procedure Laterality Date  . BREATH TEK H PYLORI N/A 05/03/2014   Procedure: BREATH TEK H PYLORI;  Surgeon: Kaylyn Lim, MD;  [facility];  Service: General;  Laterality: N/A;  . CARDIOVASCULAR STRESS TEST  03/11/2013   dr Tressia Miners turner   Low risk nuclear study w/ a very small anterior perfusion defect that most likely represents breast attenuation artifact, less likely this could represent a true perfusion abnormality in the distribution of diagonal branch of the LAD/  normal LV function and wall motion , ef 66%  . CATARACT EXTRACTION W/ INTRAOCULAR LENS  IMPLANT, BILATERAL  10/2017  . CESAREAN SECTION  1985  . CHOLECYSTECTOMY OPEN  1987  . COLONOSCOPY  02/2017   Dr [name] ; per patient they found 7 polyps with polypectomy; on 5 year track   . FINE NEEDLE ASPIRATION Left 05/22/2017   Procedure: LEFT KNEE ASPIRATION;  Surgeon: Gaynelle Arabian, MD;  [facility];  Service: Orthopedics;  Laterality: Left;  Marland Kitchen GASTRIC ROUX-EN-Y N/A 07/06/2014   Procedure: LAPAROSCOPIC ROUX-EN-Y GASTRIC BYPASS, ENTEROLYSIS OF ADHESIONS WITH UPPER ENDOSCOPY;  Surgeon: Johnathan Hausen, MD;  [facility];  Service: General;  Laterality: N/A;  . HAMMER TOE  SURGERY Bilateral 02/17/2008   bilateral foot digit's 2 and 5  . HIATAL HERNIA REPAIR N/A 07/06/2014   Procedure: LAPAROSCOPIC REPAIR OF HIATAL HERNIA;  Surgeon: Johnathan Hausen, MD;  [facility];  Service: General;  Laterality: N/A;  . I & D KNEE WITH POLY EXCHANGE Left 12/11/2019   Procedure: LEFT KNEE POLY-LINER EXCHANGE;  Surgeon: Mcarthur Rossetti, MD;  Location: [place];  Service: Orthopedics;  Laterality: Left;  . KNEE ARTHROSCOPY Right 12/11/2019   Procedure: RIGHT KNEE ARTHROSCOPY WITH PARTIAL MEDIAL MENISCECTOMY;  Surgeon: Mcarthur Rossetti, MD;  Location: [place];  Service: Orthopedics;  Laterality: Right;  . KNEE ARTHROSCOPY WITH MEDIAL MENISECTOMY Left 07/17/2017   Procedure:  LEFT KNEE ARTHROSCOPY WITH MEDIAL LATERAL MENISECTOMY;  Surgeon: Gaynelle Arabian, MD;  [facility];  Service: Orthopedics;  Laterality: Left;  Marland Kitchen MASS EXCISION N/A 10/11/2016   Procedure: EXCISION OF PERIANAL MASS;  Surgeon: [name], MD;  Location: [place];  Service: General;  Laterality: N/A;  . REFRACTIVE SURGERY    . THYROIDECTOMY  1980  . TOTAL ABDOMINAL HYSTERECTOMY  08-25-2002   dr Margaretha Glassing   ovaries retained  . TOTAL HIP ARTHROPLASTY Right 05/21/2012   Procedure: TOTAL HIP ARTHROPLASTY;  Surgeon: Gearlean Alf, MD;  [facility];  Service: Orthopedics;  Laterality: Right;  . TOTAL HIP ARTHROPLASTY Left 01-31-2009  dr Wynelle Link  . TOTAL HIP REVISION Left 05/22/2017   Procedure: Left hip bearing surface and head revision;  Surgeon: Gaynelle Arabian, MD;  [facility];  Service: Orthopedics;  Laterality: Left;  . TOTAL KNEE ARTHROPLASTY Left 01/20/2018   Procedure: LEFT TOTAL KNEE ARTHROPLASTY;  Surgeon: Gaynelle Arabian, MD;  [facility];  Service: Orthopedics;  Laterality: Left;  55min  . TRANSTHORACIC ECHOCARDIOGRAM  05/03/2016   ef 55-60%/  trivial MR/  mild TR  . TUBAL LIGATION      There were no vitals filed for this visit.   Subjective Assessment - 02/15/20 0805    Subjective Pt arriving reporting 5/10 pain on the R Knee and 6/10 pain on L knee.    Diagnostic tests MRI    Patient Stated Goals walk without walker, I want to get back to work    Pain Score 6    R knee 5/10 , L knee 6/10   Pain Orientation Right;Left    Pain Descriptors / Indicators Aching;Sore;Tightness    Pain Type Surgical pain    Pain Onset More than a month ago                             [facility] Adult PT Treatment/Exercise - 02/15/20 0001      Ambulation/Gait   Gait Comments SPC in L UE walking on level surfaces with turns.      Neuro Re-ed    Neuro Re-ed Details  SLS: cone tapping, tandum stance in parallel bars, with intermittent UE support,  Airex mat feet together x 30 seconds SBA, eyes closed feet apart with SBA.   all activities performed in parallel bars     Knee/Hip Exercises: Aerobic   Recumbent Bike Lvl 5 10 mins seat 9      Knee/Hip Exercises: Machines for Strengthening   Total Gym Leg Press 37 lbs 3 x 12 bilateral SL performed      Knee/Hip Exercises: Standing   Forward Step Up Step Height: 4";10 reps;Both;2 sets;Hand Hold: 0      Manual Therapy  Manual therapy comments seated flexion tailgait poasition with percussion                  PT Education - 02/15/20 0806    Education Details amb with straight cane    Person(s) Educated Patient    Methods Explanation    Comprehension Verbalized understanding            PT Short Term Goals - 01/27/20 0849      PT SHORT TERM GOAL #1   Title Pt. will demonstrate bilateral knee flexion AROM > 110 degreess for functional mobility in squats, walking, stairs.    Time 4    Period Weeks    Status New    Target Date 02/24/20             PT Long Term Goals - 02/15/20 6967      PT LONG TERM GOAL #1   Title Pt will be indpedent in her HEP and porgression.    Status On-going      PT LONG TERM GOAL #2   Title Pt will be able to amb without an assistive device on communtiy surfaces.    Status On-going      PT LONG TERM GOAL #3   Title Pt will improve her bilateral LE strength to >/= +4/5 in order to improve fucntional mobility.    Status On-going      PT LONG TERM GOAL #4   Title Pt will be able to perform sit to stand in </= 20 seconds.    Status On-going      PT LONG TERM GOAL #5   Title Pt will be able to navigate 5 steps with rail with step over step pattern.    Status On-going      PT LONG TERM GOAL #6   Title Pt will improve her FOTO score from 67% limitation to </= 52% limitation.    Status On-going                 Plan - 02/15/20 0846    Clinical Impression Statement Pt progressing with transitioning from walker to cane  working on dynamic balance over all endurace and strengthening to biltaeral knees.  I feel pt is currently not ready to return to work full time and feel that it would be a safety risk to work using an assistive device. Pt's ultimate goal and rehab goal is to be able to amb with no device and return to work full time working 10 hour shifts.    Personal Factors and Comorbidities Comorbidity 3+    Comorbidities bilateral hip replacement, L TKA 2019, R shoulder bursitis, tendonitis, DJD back 2017, heart palpations, OA, vit B12-def, vit D def    Examination-Activity Limitations Transfers;Sit;Squat;Stairs;Other;Stand    Examination-Participation Restrictions Community Activity;Other;Laundry;Occupation    Stability/Clinical Decision Making Evolving/Moderate complexity    Rehab Potential Good    PT Frequency Other (comment)    PT Duration 8 weeks    PT Treatment/Interventions ADLs/Self Care Home Management;Cryotherapy;Electrical Stimulation;Gait training;Stair training;Functional mobility training;Therapeutic activities;Therapeutic exercise;Balance training;Patient/family education;Manual techniques;Taping;Passive range of motion;DME Instruction;Moist Heat    PT Next Visit Plan Progress mobility of knees, strengthening in [place], CKC, progress static and dynamic balance on non compliant surfaces, then compliant    PT Home Exercise Plan Access Code: [email or web address] and Agree with Plan of Care Patient           Patient will benefit from skilled therapeutic intervention in order to improve  the following deficits and impairments:  Postural dysfunction,Decreased strength,Decreased mobility,Increased edema,Decreased balance,Impaired flexibility,Pain,Decreased activity tolerance,Obesity,Difficulty walking  Visit Diagnosis: Acute pain of right knee  Acute pain of left knee  Difficulty in walking, not elsewhere classified  Muscle weakness (generalized)  Localized edema     Problem List Patient  Active Problem List   Diagnosis Date Noted  . Status post arthroscopy of right knee 12/11/2019  . 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 Acute medial meniscal tear, right, subsequent encounter 08/27/2019  . History of total left knee replacement 07/16/2019  . Peroneal neuropathy at knee, left 06/25/2019  . Lumbosacral radiculopathy 06/07/2019  . 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 IDA (iron deficiency anemia) 10/16/2018  . OA (osteoarthritis) of knee 01/20/2018  . Acute meniscal tear, medial 07/17/2017  . 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 Insomnia 11/08/2016  . Pernicious anemia 11/08/2016  . Leukopenia 07/09/2016  . Hypocupremia 07/09/2016  . Palpitations 04/12/2016  . 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 Lap gastric bypass May 2016 07/06/2014  . History of Clostridium difficile infection 08/12/2011  . Postoperative hypothyroidism 08/26/2010  . Gastroesophageal reflux disease 08/26/2010  . Vitamin D deficiency 08/26/2010  . Fibrocystic disease of breast 08/26/2010  . Cobalamin deficiency 08/26/2010  . Obesity 03/20/2010  . Backache 03/20/2010    Oretha Caprice, PT, MPT 02/15/2020, 8:50 AM  [facility] Physical Therapy [address] [place], [place], [address] Phone: [phone]   Fax:  [phone]  Name: Serafina Topham MRN: [id number] Date of Birth: 07/20/1960

## 2020-02-17 ENCOUNTER — Other Ambulatory Visit: Payer: Self-pay | Admitting: Neurology

## 2020-02-17 ENCOUNTER — Other Ambulatory Visit: Payer: Self-pay

## 2020-02-17 ENCOUNTER — Encounter: Payer: Self-pay | Admitting: Rehabilitative and Restorative Service Providers"

## 2020-02-17 ENCOUNTER — Ambulatory Visit (INDEPENDENT_AMBULATORY_CARE_PROVIDER_SITE_OTHER): Payer: No Typology Code available for payment source | Admitting: Rehabilitative and Restorative Service Providers"

## 2020-02-17 DIAGNOSIS — M6281 Muscle weakness (generalized): Secondary | ICD-10-CM

## 2020-02-17 DIAGNOSIS — G8929 Other chronic pain: Secondary | ICD-10-CM

## 2020-02-17 DIAGNOSIS — G43009 Migraine without aura, not intractable, without status migrainosus: Secondary | ICD-10-CM

## 2020-02-17 DIAGNOSIS — R262 Difficulty in walking, not elsewhere classified: Secondary | ICD-10-CM | POA: Diagnosis not present

## 2020-02-17 DIAGNOSIS — M25661 Stiffness of right knee, not elsewhere classified: Secondary | ICD-10-CM | POA: Diagnosis not present

## 2020-02-17 DIAGNOSIS — M25562 Pain in left knee: Secondary | ICD-10-CM

## 2020-02-17 DIAGNOSIS — M25662 Stiffness of left knee, not elsewhere classified: Secondary | ICD-10-CM | POA: Diagnosis not present

## 2020-02-17 DIAGNOSIS — M25561 Pain in right knee: Secondary | ICD-10-CM

## 2020-02-17 MED FILL — NURTEC 75 MG TBDP: 75 | 30 days supply | Qty: 8 | Fill #0

## 2020-02-17 NOTE — Therapy (Signed)
[facility] Physical Therapy [address] [place], [place], [address] Phone: [phone]   Fax:  [phone]  Physical Therapy Treatment/Reassessment  Patient Details  Name: Victoria [name] MRN: [id number] Date of Birth: 04/06/1960 Referring Provider (PT): Jean Rosenthal, MD   Encounter Date: 02/17/2020   PT End of Session - 02/17/20 0900    Visit Number 16    Number of Visits 22    Date for PT Re-Evaluation 04/01/20    Progress Note Due on Visit 20    PT Start Time 0802    PT Stop Time 0858    PT Time Calculation (min) 56 min    Activity Tolerance Patient tolerated treatment well    Behavior During Therapy [facility] for tasks assessed/performed           Past Medical History:  Diagnosis Date  . Bursitis    right shoulder  . Chronic leukopenia    intermittant since 2010, followed by Dr. Renold Genta now  . Degenerative joint disease of low back 09/02/15    Dr. Maureen Ralphs  . Fibrocystic breast   . GERD (gastroesophageal reflux disease)   . Headache    per pt more stress/tension  . Heart palpitations    SEE EPIC ENCOUTNER , CARDIOLOGY DR. Tressia Miners TURNER 2018; reports on 05-16-17 " i haven't had any bouts of those lately"    . Hemorrhoids   . History of Clostridium difficile infection 12/2010  . History of exercise intolerance    ETT on 04-27-2016-- negative ([facility] treadmill score 7)  . History of Helicobacter pylori infection 2001 and 09/ 2012  . HSV-2 infection    genital  . Hx of adenomatous colonic polyps 10/17/2007  . Hydradenitis    per pt currently treated with Humira injection  . Hypothyroidism, postsurgical 1980  . Medial meniscus tear    right knee  . Osteoarthritis   . Pernicious anemia    b12 def  . PONV (postoperative nausea and vomiting)   . 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 Tendonitis    right shoulder  . Varicose veins   . Vitamin B 12  deficiency   . Vitamin D deficiency     Past Surgical History:  Procedure Laterality Date  . BREATH TEK H PYLORI N/A 05/03/2014   Procedure: BREATH TEK H PYLORI;  Surgeon: Kaylyn Lim, MD;  [facility];  Service: General;  Laterality: N/A;  . CARDIOVASCULAR STRESS TEST  03/11/2013   dr Tressia Miners turner   Low risk nuclear study w/ a very small anterior perfusion defect that most likely represents breast attenuation artifact, less likely this could represent a true perfusion abnormality in the distribution of diagonal branch of the LAD/  normal LV function and wall motion , ef 66%  . CATARACT EXTRACTION W/ INTRAOCULAR LENS  IMPLANT, BILATERAL  10/2017  . CESAREAN SECTION  1985  . CHOLECYSTECTOMY OPEN  1987  . COLONOSCOPY  02/2017   Dr [name] ; per patient they found 7 polyps with polypectomy; on 5 year track   . FINE NEEDLE ASPIRATION Left 05/22/2017   Procedure: LEFT KNEE ASPIRATION;  Surgeon: Gaynelle Arabian, MD;  [facility];  Service: Orthopedics;  Laterality: Left;  Marland Kitchen GASTRIC ROUX-EN-Y N/A 07/06/2014   Procedure: LAPAROSCOPIC ROUX-EN-Y GASTRIC BYPASS, ENTEROLYSIS OF ADHESIONS WITH UPPER ENDOSCOPY;  Surgeon: Johnathan Hausen, MD;  [facility];  Service: General;  Laterality: N/A;  . HAMMER TOE  SURGERY Bilateral 02/17/2008   bilateral foot digit's 2 and 5  . HIATAL HERNIA REPAIR N/A 07/06/2014   Procedure: LAPAROSCOPIC REPAIR OF HIATAL HERNIA;  Surgeon: Johnathan Hausen, MD;  [facility];  Service: General;  Laterality: N/A;  . I & D KNEE WITH POLY EXCHANGE Left 12/11/2019   Procedure: LEFT KNEE POLY-LINER EXCHANGE;  Surgeon: Mcarthur Rossetti, MD;  Location: [place];  Service: Orthopedics;  Laterality: Left;  . KNEE ARTHROSCOPY Right 12/11/2019   Procedure: RIGHT KNEE ARTHROSCOPY WITH PARTIAL MEDIAL MENISCECTOMY;  Surgeon: Mcarthur Rossetti, MD;  Location: [place];  Service: Orthopedics;  Laterality: Right;  . KNEE ARTHROSCOPY WITH MEDIAL MENISECTOMY Left 07/17/2017    Procedure: LEFT KNEE ARTHROSCOPY WITH MEDIAL LATERAL MENISECTOMY;  Surgeon: Gaynelle Arabian, MD;  [facility];  Service: Orthopedics;  Laterality: Left;  Marland Kitchen MASS EXCISION N/A 10/11/2016   Procedure: EXCISION OF PERIANAL MASS;  Surgeon: [name], MD;  Location: [place];  Service: General;  Laterality: N/A;  . REFRACTIVE SURGERY    . THYROIDECTOMY  1980  . TOTAL ABDOMINAL HYSTERECTOMY  08-25-2002   dr Margaretha Glassing   ovaries retained  . TOTAL HIP ARTHROPLASTY Right 05/21/2012   Procedure: TOTAL HIP ARTHROPLASTY;  Surgeon: Gearlean Alf, MD;  [facility];  Service: Orthopedics;  Laterality: Right;  . TOTAL HIP ARTHROPLASTY Left 01-31-2009  dr Wynelle Link  . TOTAL HIP REVISION Left 05/22/2017   Procedure: Left hip bearing surface and head revision;  Surgeon: Gaynelle Arabian, MD;  [facility];  Service: Orthopedics;  Laterality: Left;  . TOTAL KNEE ARTHROPLASTY Left 01/20/2018   Procedure: LEFT TOTAL KNEE ARTHROPLASTY;  Surgeon: Gaynelle Arabian, MD;  [facility];  Service: Orthopedics;  Laterality: Left;  23mn  . TRANSTHORACIC ECHOCARDIOGRAM  05/03/2016   ef 55-60%/  trivial MR/  mild TR  . TUBAL LIGATION      There were no vitals filed for this visit.   Subjective Assessment - 02/17/20 0802    Subjective SFreda Munronotes 3-4 hours of uninterruptes sleep with meds.  She is using the walker outside the house and < 50% of the time inside the house.    Pertinent History Bilateral THA, L TKA, tendonitis, R shoulder bursitis, DJD back 2017, GERD, heart palpitations, OA, PONV, vit D deficiency, bit B12 deficiency    Limitations Standing;Walking    How long can you stand comfortably? < 10 minutes    How long can you walk comfortably? < 10 minutes    Diagnostic tests MRI    Patient Stated Goals walk without walker, I want to get back to work    Currently in Pain? Yes    Pain Score 5    7/10 on Lt, 5/10 on Rt   Pain Location Knee    Pain Orientation Right;Left    Pain  Descriptors / Indicators Aching;Sore;Tightness    Pain Type Surgical pain    Pain Onset More than a month ago    Pain Frequency Intermittent    Aggravating Factors  Standing and walking    Pain Relieving Factors Meds and rest    Effect of Pain on Daily Activities Limits WB endurance and function    Multiple Pain Sites No              [facility] PT Assessment - 02/17/20 0001      ROM / Strength   AROM / PROM / Strength AROM;Strength      AROM   Overall AROM  Deficits    AROM Assessment Site Knee    Right/Left Knee Left;Right    Right Knee Extension 0    Right Knee Flexion 121    Left Knee Extension 0    Left Knee Flexion 104      Strength   Overall Strength Deficits    Right Knee Extension 3+/5    Left Knee Extension 3-/5                         [facility] Adult PT Treatment/Exercise - 02/17/20 0804      Therapeutic Activites    Therapeutic Activities ADL's    ADL's Step-up and over with slow eccentrics 10X each 6 inch step      Exercises   Exercises Knee/Hip      Knee/Hip Exercises: Aerobic   Recumbent Bike Seat 9, partial to full revolutions; L5 x 10 minutes      Knee/Hip Exercises: Machines for Strengthening   Cybex Knee Extension 15# 20X slow eccentrics    Total Gym Leg Press 100# & 112# 15X each double leg slow eccentrics and 1 set of 10 @ 50# each leg slow eccentrics      Knee/Hip Exercises: Seated   Other Seated Knee/Hip Exercises Tailgate knee flexion AROM 2X 1 minute    Sit to Sand 2 sets;without UE support;Other (comment)   5 reps slow eccentrics                 PT Education - 02/17/20 0857    Education Details Reviewed HEP.  Discussed focusing on quadriceps strength and secondarily knee flexion AROM at home.    Person(s) Educated Patient    Methods Explanation;Demonstration;Tactile cues;Verbal cues    Comprehension Verbalized understanding;Tactile cues required;Need further instruction;Returned demonstration;Verbal cues required             PT Short Term Goals - 02/17/20 0858      PT SHORT TERM GOAL #1   Title Pt. will demonstrate bilateral knee flexion AROM > 110 degreess for functional mobility in squats, walking, stairs.    Time 4    Period Weeks    Status Partially Met    Target Date 02/24/20             PT Long Term Goals - 02/17/20 0858      PT LONG TERM GOAL #1   Title Pt will be indpedent in her HEP and progression.    Status On-going      PT LONG TERM GOAL #2   Title Pt will be able to amb without an assistive device on communtiy surfaces.    Status On-going      PT LONG TERM GOAL #3   Title Pt will improve her bilateral LE strength to >/= +4/5 in order to improve fucntional mobility.    Status On-going      PT LONG TERM GOAL #4   Title Pt will be able to perform sit to stand in </= 20 seconds.    Status On-going      PT LONG TERM GOAL #5   Title Pt will be able to navigate 5 steps with rail with step over step pattern.    Status On-going      PT LONG TERM GOAL #6   Title Pt will improve her FOTO score from 67% limitation to </= 52% limitation.    Baseline 33 at evaluation, 44 today, 48 goal    Time 8  Period Weeks    Status On-going                 Plan - 02/17/20 0900    Clinical Impression Statement [name] has excellent B knee AROM (full extension bilateral and flexion 121/104 L/R).  Quadriceps strength and endurance is the current emphasis.  With continued strength work, remaining gait quality and endurance, pain and functional goals will be met.    Personal Factors and Comorbidities Comorbidity 3+    Comorbidities bilateral hip replacement, L TKA 2019, R shoulder bursitis, tendonitis, DJD back 2017, heart palpations, OA, vit B12-def, vit D def    Examination-Activity Limitations Transfers;Sit;Squat;Stairs;Other;Stand    Examination-Participation Restrictions Community Activity;Other;Laundry;Occupation    Stability/Clinical Decision Making Evolving/Moderate complexity     Rehab Potential Good    PT Frequency Other (comment)    PT Duration 8 weeks    PT Treatment/Interventions ADLs/Self Care Home Management;Cryotherapy;Electrical Stimulation;Gait training;Stair training;Functional mobility training;Therapeutic activities;Therapeutic exercise;Balance training;Patient/family education;Manual techniques;Taping;Passive range of motion;DME Instruction;Moist Heat    PT Next Visit Plan Quadriceps strengthening, balance and gait quality/endurance    PT Home Exercise Plan Access Code: [email or web address]    Consulted and Agree with Plan of Care Patient           Patient will benefit from skilled therapeutic intervention in order to improve the following deficits and impairments:  Postural dysfunction,Decreased strength,Decreased mobility,Increased edema,Decreased balance,Impaired flexibility,Pain,Decreased activity tolerance,Obesity,Difficulty walking  Visit Diagnosis: Difficulty in walking, not elsewhere classified  Muscle weakness (generalized)  Stiffness of left knee, not elsewhere classified  Stiffness of right knee, not elsewhere classified  Chronic pain of left knee  Chronic pain of right knee     Problem List Patient Active Problem List   Diagnosis Date Noted  . Status post arthroscopy of right knee 12/11/2019  . 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 Acute medial meniscal tear, right, subsequent encounter 08/27/2019  . History of total left knee replacement 07/16/2019  . Peroneal neuropathy at knee, left 06/25/2019  . Lumbosacral radiculopathy 06/07/2019  . 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 IDA (iron deficiency anemia) 10/16/2018  . OA (osteoarthritis) of knee 01/20/2018  . Acute meniscal tear, medial 07/17/2017  . 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 Insomnia 11/08/2016  . Pernicious anemia 11/08/2016  . Leukopenia 07/09/2016  . Hypocupremia 07/09/2016   . Palpitations 04/12/2016  . 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 Lap gastric bypass May 2016 07/06/2014  . History of Clostridium difficile infection 08/12/2011  . Postoperative hypothyroidism 08/26/2010  . Gastroesophageal reflux disease 08/26/2010  . Vitamin D deficiency 08/26/2010  . Fibrocystic disease of breast 08/26/2010  . Cobalamin deficiency 08/26/2010  . Obesity 03/20/2010  . Backache 03/20/2010    Farley Ly PT, MPT 02/17/2020, 9:03 AM  [facility] Physical Therapy [address] [place], [place], [address] Phone: [phone]   Fax:  [phone]  Name: Lexus Shampine MRN: [id number] Date of Birth: September 02, 1960

## 2020-02-22 ENCOUNTER — Ambulatory Visit (INDEPENDENT_AMBULATORY_CARE_PROVIDER_SITE_OTHER): Payer: No Typology Code available for payment source | Admitting: Orthopaedic Surgery

## 2020-02-22 ENCOUNTER — Encounter: Payer: Self-pay | Admitting: Orthopaedic Surgery

## 2020-02-22 DIAGNOSIS — Z96652 Presence of left artificial knee joint: Secondary | ICD-10-CM

## 2020-02-22 DIAGNOSIS — Z9889 Other specified postprocedural states: Secondary | ICD-10-CM

## 2020-02-22 NOTE — Progress Notes (Signed)
The patient is still having a difficult time with mobility given her right knee pain.  She is status post a left knee polyliner exchange and a right knee arthroscopy where we found mild to moderate arthritis in the on the right side and a medial meniscal tear.  She has been participating in physical therapy.  The left knee seems to be doing well but the right knee is still having a lot of pain.  At her last visit I did place a steroid injection in that knee and that helped some.  Both knees move well but the right knee is more painful.  The right knee does have slight valgus malalignment.  My next step would be recommending hyaluronic acid for the right knee to treat the pain from her osteoarthritis.  We will see if we can get that approved and see her back accordingly.  I need to continue to keep her out of work likely next 4 to 6 weeks as she is continuing to recover.

## 2020-02-23 ENCOUNTER — Other Ambulatory Visit: Payer: Self-pay

## 2020-02-23 ENCOUNTER — Ambulatory Visit (INDEPENDENT_AMBULATORY_CARE_PROVIDER_SITE_OTHER): Payer: No Typology Code available for payment source | Admitting: Obstetrics and Gynecology

## 2020-02-23 ENCOUNTER — Encounter: Payer: Self-pay | Admitting: Obstetrics and Gynecology

## 2020-02-23 VITALS — BP 134/82 | HR 49 | Ht 67.5 in | Wt 263.0 lb

## 2020-02-23 DIAGNOSIS — N898 Other specified noninflammatory disorders of vagina: Secondary | ICD-10-CM

## 2020-02-23 DIAGNOSIS — R3129 Other microscopic hematuria: Secondary | ICD-10-CM | POA: Diagnosis not present

## 2020-02-23 DIAGNOSIS — Z8742 Personal history of other diseases of the female genital tract: Secondary | ICD-10-CM

## 2020-02-23 LAB — POCT URINALYSIS DIPSTICK
Bilirubin, UA: NEGATIVE
Glucose, UA: NEGATIVE
Ketones, UA: NEGATIVE
Leukocytes, UA: NEGATIVE
Nitrite, UA: NEGATIVE
Protein, UA: NEGATIVE
Urobilinogen, UA: NEGATIVE E.U./dL — AB
pH, UA: 6 (ref 5.0–8.0)

## 2020-02-23 MED FILL — HUMIRA PEN 40 MG/0.8ML PNKT: 40 | 28 days supply | Qty: 4 | Fill #2

## 2020-02-23 NOTE — Telephone Encounter (Signed)
Faxed office notes from [facility] to Dr Gertie Exon. Called and schedule patient for an appointment.

## 2020-02-23 NOTE — Progress Notes (Signed)
GYNECOLOGY  VISIT   HPI: 59 y.o.   Married Black or [place] American Not Hispanic or Latino  female   [phone] with No LMP recorded. Patient has had a hysterectomy.   here for f/u of DIV. She was using hydrocortisone suppositories for one month. While on it her D/C stopped and her irritation went away. Since stopping the suppositories 5 days ago she has noticed an increase in yellow d/c. No irritation.  Also needs repeat urine to check for blood. On questioning she was seen by Urology in the past for microscopic hematuria, was told her evaluation was negative. This was many years ago.  Patient states that she just does not feel good today, she was light headed earlier today. Currently not light headed.    GYNECOLOGIC HISTORY: No LMP recorded. Patient has had a hysterectomy. Contraception:none  Menopausal hormone therapy: has rx for e-string but has not started yet.         OB History    Gravida  2   Para  1   Term      Preterm  1   AB  1   Living  2     SAB      IAB  1   Ectopic      Multiple  1   Live Births  2              Patient Active Problem List   Diagnosis Date Noted  . Status post arthroscopy of right knee 12/11/2019  . 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 Acute medial meniscal tear, right, subsequent encounter 08/27/2019  . History of total left knee replacement 07/16/2019  . Peroneal neuropathy at knee, left 06/25/2019  . Lumbosacral radiculopathy 06/07/2019  . 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 IDA (iron deficiency anemia) 10/16/2018  . OA (osteoarthritis) of knee 01/20/2018  . Acute meniscal tear, medial 07/17/2017  . 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 Insomnia 11/08/2016  . Pernicious anemia 11/08/2016  . Leukopenia 07/09/2016  . Hypocupremia 07/09/2016  . Palpitations 04/12/2016  . 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 Postgastric surgery syndrome  10/31/2015  . Lap gastric bypass May 2016 07/06/2014  . History of Clostridium difficile infection 08/12/2011  . Postoperative hypothyroidism 08/26/2010  . Gastroesophageal reflux disease 08/26/2010  . Vitamin D deficiency 08/26/2010  . Fibrocystic disease of breast 08/26/2010  . Cobalamin deficiency 08/26/2010  . Obesity 03/20/2010  . Backache 03/20/2010    Past Medical History:  Diagnosis Date  . Bursitis    right shoulder  . Chronic leukopenia    intermittant since 2010, followed by Dr. Renold Genta now  . Degenerative joint disease of low back 09/02/15    Dr. Maureen Ralphs  . Fibrocystic breast   . GERD (gastroesophageal reflux disease)   . Headache    per pt more stress/tension  . Heart palpitations    SEE EPIC ENCOUTNER , CARDIOLOGY DR. Tressia Miners TURNER 2018; reports on 05-16-17 " i haven't had any bouts of those lately"    . Hemorrhoids   . History of Clostridium difficile infection 12/2010  . History of exercise intolerance    ETT on 04-27-2016-- negative ([facility] treadmill score 7)  . History of Helicobacter pylori infection 2001 and 09/ 2012  . HSV-2 infection    genital  . Hx of adenomatous colonic polyps 10/17/2007  . Hydradenitis    per pt currently treated with Humira injection  .  Hypothyroidism, postsurgical 1980  . Medial meniscus tear    right knee  . Osteoarthritis   . Pernicious anemia    b12 def  . PONV (postoperative nausea and vomiting)   . 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 Tendonitis    right shoulder  . Varicose veins   . Vitamin B 12 deficiency   . Vitamin D deficiency     Past Surgical History:  Procedure Laterality Date  . BREATH TEK H PYLORI N/A 05/03/2014   Procedure: BREATH TEK H PYLORI;  Surgeon: Kaylyn Lim, MD;  Location: [facility];  Service: General;  Laterality: N/A;  . CARDIOVASCULAR STRESS TEST  03/11/2013   dr Tressia Miners turner   Low risk nuclear study w/ a  very small anterior perfusion defect that most likely represents breast attenuation artifact, less likely this could represent a true perfusion abnormality in the distribution of diagonal branch of the LAD/  normal LV function and wall motion , ef 66%  . CATARACT EXTRACTION W/ INTRAOCULAR LENS  IMPLANT, BILATERAL  10/2017  . CESAREAN SECTION  1985  . CHOLECYSTECTOMY OPEN  1987  . COLONOSCOPY  02/2017   Dr [name] ; per patient they found 7 polyps with polypectomy; on 5 year track   . FINE NEEDLE ASPIRATION Left 05/22/2017   Procedure: LEFT KNEE ASPIRATION;  Surgeon: Gaynelle Arabian, MD;  Location: [facility];  Service: Orthopedics;  Laterality: Left;  Marland Kitchen GASTRIC ROUX-EN-Y N/A 07/06/2014   Procedure: LAPAROSCOPIC ROUX-EN-Y GASTRIC BYPASS, ENTEROLYSIS OF ADHESIONS WITH UPPER ENDOSCOPY;  Surgeon: Johnathan Hausen, MD;  Location: [facility];  Service: General;  Laterality: N/A;  . HAMMER TOE SURGERY Bilateral 02/17/2008   bilateral foot digit's 2 and 5  . HIATAL HERNIA REPAIR N/A 07/06/2014   Procedure: LAPAROSCOPIC REPAIR OF HIATAL HERNIA;  Surgeon: Johnathan Hausen, MD;  Location: [facility];  Service: General;  Laterality: N/A;  . I & D KNEE WITH POLY EXCHANGE Left 12/11/2019   Procedure: LEFT KNEE POLY-LINER EXCHANGE;  Surgeon: Mcarthur Rossetti, MD;  Location: [facility];  Service: Orthopedics;  Laterality: Left;  . KNEE ARTHROSCOPY Right 12/11/2019   Procedure: RIGHT KNEE ARTHROSCOPY WITH PARTIAL MEDIAL MENISCECTOMY;  Surgeon: Mcarthur Rossetti, MD;  Location: [facility];  Service: Orthopedics;  Laterality: Right;  . KNEE ARTHROSCOPY WITH MEDIAL MENISECTOMY Left 07/17/2017   Procedure: LEFT KNEE ARTHROSCOPY WITH MEDIAL LATERAL MENISECTOMY;  Surgeon: Gaynelle Arabian, MD;  Location: [facility];  Service: Orthopedics;  Laterality: Left;  Marland Kitchen MASS EXCISION N/A 10/11/2016   Procedure: EXCISION OF PERIANAL MASS;  Surgeon: [name], MD;  Location: [facility];  Service: General;  Laterality: N/A;  .  REFRACTIVE SURGERY    . THYROIDECTOMY  1980  . TOTAL ABDOMINAL HYSTERECTOMY  08-25-2002   dr Margaretha Glassing   ovaries retained  . TOTAL HIP ARTHROPLASTY Right 05/21/2012   Procedure: TOTAL HIP ARTHROPLASTY;  Surgeon: Gearlean Alf, MD;  Location: [facility];  Service: Orthopedics;  Laterality: Right;  . TOTAL HIP ARTHROPLASTY Left 01-31-2009  dr Wynelle Link  . TOTAL HIP REVISION Left 05/22/2017   Procedure: Left hip bearing surface and head revision;  Surgeon: Gaynelle Arabian, MD;  Location: [facility];  Service: Orthopedics;  Laterality: Left;  . TOTAL KNEE ARTHROPLASTY Left 01/20/2018   Procedure: LEFT TOTAL KNEE ARTHROPLASTY;  Surgeon: Gaynelle Arabian, MD;  Location: [facility];  Service: Orthopedics;  Laterality: Left;  9min  . TRANSTHORACIC ECHOCARDIOGRAM  05/03/2016   ef 55-60%/  trivial MR/  mild TR  . TUBAL LIGATION      Current Outpatient Medications  Medication Sig Dispense Refill  . Adalimumab (HUMIRA PEN) 40 MG/0.8ML PNKT Inject one pen under the skin each week. 4 each 5  . ALPRAZolam (XANAX) 1 MG tablet TAKE 1 TABLET BY MOUTH EVERY NIGHT AT BEDTIME (Patient taking differently: Take 1 mg by mouth at bedtime as needed for sleep.) 90 tablet 0  . betamethasone valerate ointment (VALISONE) 0.1 % Apply 1 application topically 2 (two) times daily. Can use for up to 2 weeks as needed 15 g 0  . Calcium Carb-Cholecalciferol (CALCIUM + D3 PO) Take 1 tablet by mouth daily.    . celecoxib (CELEBREX) 200 MG capsule Take 1 capsule (200 mg total) by mouth 2 (two) times daily between meals as needed. 60 capsule 1  . cyanocobalamin (,VITAMIN B-12,) 1000 MCG/ML injection INJECT 1 ML INTRAMUSCULARLY EVERY MONTH (Patient taking differently: Inject 1,000 mcg into the muscle every 30 (thirty) days. INJECT 1 ML INTRAMUSCULARLY EVERY MONTH) 3 mL 11  . DIVIGEL 1 MG/GM GEL APPLY TO THE SKIN AT BEDTIME (Patient taking differently: Apply 1 application topically at bedtime.) 90 g 4  . gabapentin (NEURONTIN) 600 MG tablet TAKE 1  TABLET BY MOUTH 3 TIMES DAILY. 90 tablet 2  . HYDROmorphone (DILAUDID) 4 MG tablet Take 1 tablet (4 mg total) by mouth every 6 (six) hours as needed for severe pain. 30 tablet 0  . levothyroxine (SYNTHROID) 112 MCG tablet TAKE 1 TABLET BY MOUTH ONCE A DAY (Patient taking differently: Take 112 mcg by mouth daily at 2 am. During nighttime bathroom break) 90 tablet 2  . Multiple Vitamin (MULTIVITAMIN WITH MINERALS) TABS tablet Take 1 tablet by mouth daily.    . NURTEC 75 MG TBDP TAKE 75 MG BY MOUTH DAILY AS NEEDED FOR MIGRAINES. TAKE AS CLOSE TO ONSET OF MIGRAINE AS POSSIBLE. ONE DAILY MAXIMUM. 8 tablet 11  . pantoprazole (PROTONIX) 40 MG tablet TAKE 1 TABLET BY MOUTH TWICE DAILY--- PRN (Patient taking differently: Take 40 mg by mouth 2 (two) times daily as needed (indigestion/acid reflux.).)    . polyethylene glycol (MIRALAX / GLYCOLAX) packet Take 17 g by mouth daily as needed for mild constipation.    . promethazine (PHENERGAN) 25 MG tablet TAKE 1 TABLET BY MOUTH EVERY 8 HOURS AS NEEDED FOR NAUSEA AND VOMITING (Patient taking differently: Take 25 mg by mouth every 8 (eight) hours as needed for nausea or vomiting.) 30 tablet 5  . tiZANidine (ZANAFLEX) 4 MG tablet TAKE 1 TABLET BY MOUTH EVERY 8 HOURS AS NEEDED FOR MUSCLE SPASMS 40 tablet 1  . traZODone (DESYREL) 50 MG tablet Take 1 tablet (50 mg total) by mouth at bedtime. (Patient taking differently: Take 50 mg by mouth at bedtime as needed for sleep.) 30 tablet 1  . valACYclovir (VALTREX) 500 MG tablet TAKE 1 TABLET BY MOUTH ONCE DAILY (Patient taking differently: Take 500 mg by mouth daily as needed (outbreaks).) 90 tablet 3  . estradiol (ESTRING) 2 MG vaginal ring Place 2 mg vaginally every 3 (three) months. follow package directions (Patient not taking: No sig reported) 1 each 3   No current facility-administered medications for this visit.   Facility-Administered Medications Ordered in Other Visits  Medication Dose Route Frequency Provider Last  Rate Last Admin  . gadobenate dimeglumine (MULTIHANCE) injection 20 mL  20 mL Intravenous Once PRN Anson Fret, MD  ALLERGIES: Other and Estradiol  Family History  Problem Relation Age of Onset  . Diabetes Father   . Diabetes Sister   . Hypertension Sister   . Hypertension Mother   . Headache Mother     Social History   Socioeconomic History  . Marital status: Married    Spouse name: Not on file  . Number of children: 2  . Years of education: Not on file  . Highest education level: Not on file  Occupational History  . Occupation: nurse  Tobacco Use  . Smoking status: Never Smoker  . Smokeless tobacco: Never Used  Vaping Use  . Vaping Use: Never used  Substance and Sexual Activity  . Alcohol use: No  . Drug use: No  . Sexual activity: Not Currently    Partners: Male    Birth control/protection: Surgical    Comment: hysterectomy  Other Topics Concern  . Not on file  Social History Narrative   Lives at home with spouse   Right handed   Caffeine: diet zero mtn dew, occasionally    Social Determinants of Health   Financial Resource Strain: Not on file  Food Insecurity: Not on file  Transportation Needs: Not on file  Physical Activity: Not on file  Stress: Not on file  Social Connections: Not on file  Intimate Partner Violence: Not on file    Review of Systems  All other systems reviewed and are negative.   PHYSICAL EXAMINATION:    BP 134/82   Pulse (!) 49   Ht 5' 7.5" (1.715 m)   Wt 263 lb (119.3 kg)   SpO2 99%   BMI 40.58 kg/m     General appearance: alert, cooperative and appears stated age  Pelvic: External genitalia:  no lesions              Urethra:  normal appearing urethra with no masses, tenderness or lesions              Bartholins and Skenes: normal                 Vagina: normal appearing vagina with a slight increase in creamy, white vaginal d/c, no erythema (much improved). Mild atrophy              Cervix: absent                Chaperone was present for exam.  Wet prep: no clue, no trich, ++ wbc (no longer with sheets of WBC) KOH: no yeast PH: 4   ASSESSMENT H/O DIV, improved after 1 month treatment with steroid suppositories H/O vaginal atrophy, she has the estring to start Microscopic hematuria, she reports evaluation by Urology in the distant past    PLAN Patient will call with change in vaginal d/c Start the estring (she has it) Send urine for micro ua Will get a copy of her Urology notes

## 2020-02-24 LAB — URINALYSIS, MICROSCOPIC ONLY
Bacteria, UA: NONE SEEN
Casts: NONE SEEN /lpf
WBC, UA: NONE SEEN /hpf (ref 0–5)

## 2020-02-29 ENCOUNTER — Telehealth: Payer: Self-pay

## 2020-02-29 NOTE — Telephone Encounter (Signed)
Submitted for VOB for [facility]-Right knee  Need to contact focus plan as well to start prior auth

## 2020-03-01 ENCOUNTER — Encounter: Payer: No Typology Code available for payment source | Admitting: Physical Therapy

## 2020-03-02 ENCOUNTER — Encounter: Payer: Self-pay | Admitting: Orthopaedic Surgery

## 2020-03-03 ENCOUNTER — Ambulatory Visit (INDEPENDENT_AMBULATORY_CARE_PROVIDER_SITE_OTHER): Payer: No Typology Code available for payment source | Admitting: Internal Medicine

## 2020-03-03 ENCOUNTER — Encounter: Payer: No Typology Code available for payment source | Admitting: Rehabilitative and Restorative Service Providers"

## 2020-03-03 ENCOUNTER — Other Ambulatory Visit: Payer: Self-pay

## 2020-03-03 ENCOUNTER — Ambulatory Visit
Admission: RE | Admit: 2020-03-03 | Discharge: 2020-03-03 | Disposition: A | Discharge status: Home / Self Care | Payer: No Typology Code available for payment source | Source: Ambulatory Visit | Attending: Internal Medicine | Admitting: Internal Medicine

## 2020-03-03 ENCOUNTER — Encounter: Payer: Self-pay | Admitting: Internal Medicine

## 2020-03-03 VITALS — BP 120/80 | HR 81 | Temp 97.7°F | Ht 67.5 in | Wt 263.0 lb

## 2020-03-03 DIAGNOSIS — R3129 Other microscopic hematuria: Secondary | ICD-10-CM

## 2020-03-03 DIAGNOSIS — I951 Orthostatic hypotension: Secondary | ICD-10-CM

## 2020-03-03 DIAGNOSIS — R0781 Pleurodynia: Secondary | ICD-10-CM | POA: Diagnosis not present

## 2020-03-03 DIAGNOSIS — R42 Dizziness and giddiness: Secondary | ICD-10-CM | POA: Diagnosis not present

## 2020-03-03 DIAGNOSIS — R0789 Other chest pain: Secondary | ICD-10-CM

## 2020-03-03 LAB — POCT URINALYSIS DIPSTICK
Appearance: NEGATIVE
Bilirubin, UA: NEGATIVE
Glucose, UA: NEGATIVE
Ketones, UA: NEGATIVE
Leukocytes, UA: NEGATIVE
Nitrite, UA: NEGATIVE
Odor: NEGATIVE
Protein, UA: NEGATIVE
Spec Grav, UA: 1.01 (ref 1.010–1.025)
Urobilinogen, UA: 0.2 E.U./dL
pH, UA: 6.5 (ref 5.0–8.0)

## 2020-03-03 MED ORDER — HYDROCODONE-ACETAMINOPHEN 10-325 MG PO TABS: ORAL_TABLET | ORAL | 0 refills | Status: DC

## 2020-03-03 MED FILL — HYDROCODON-APAP 10-325: 10-325 | 5 days supply | Qty: 15 | Fill #0

## 2020-03-03 NOTE — Telephone Encounter (Signed)
I am working on it. She has the focus plan so I have to call her insurance to start the auth process. I was going to call tomorrow

## 2020-03-03 NOTE — Progress Notes (Signed)
   Subjective:    Patient ID: Victoria [name], female    DOB: September 19, 1960, 60 y.o.   MRN: [id number]  HPI 60 year old Female continues to not feel well s/p knee surgery. Has malaise, fatigue and dizziness. Also, has been found to have hematuria but old records indicate she had microscopic hematuria in 2011. We will refer to Urology for evaluation ( Dr. Arita Miss) .  History of gastric bypass surgery. Says blood sugar stable and she is eating. No dysuria. Hx hypothyroidism.  Labs drawn today to evaluate fatigue.  Has pain right posterior rib cage area.  Takes Humira for hx of hidradenitis suppurativa  Takes Desyrel 50 mg at bedtime which may be contributing to orthostasis. Also, has taken Gabapentin for pain which may be a factor in her symptoms as well.  Chest x-ray and rib films are negative.  No known Covid-19 exposure. Has had 3 vaccines last one was in November.  Review of Systems lightheadedness     Objective:   Physical Exam Blood pressure today 120/88 but when she stands blood pressure is 100/60.  Pulse increases from 70 sitting to 80 standing.  Pulse oximetry is 98 to 99%.  BMI is 40.58  Skin warm and dry.  Neck is supple without thyromegaly.  Chest clear.  Cardiac exam regular rate and rhythm.       Assessment & Plan:  Orthostasis- d/c gabapentin. Try Norco 10/325 one half tab every 8-12 hours if having pain (#15) with no refill  Right rib pain. Order CXR and rib detail-the studies were negative  Microscopic hematuria- to see Urologist- Dr. Arita Miss for evaluation. Records from 2011 prior to Epic indicate pt had benign microscopic hematuria  History of gastric bypass  History of right knee arthroscopy with partial medial meniscectomy and at the same time left knee polyliner exchange with removal of scar tissue.-Slowly improving.  Patient concerned because she is going to have to return to work soon and is worried she cannot do her job if her knees are bothering  her.  She is also worried about rib cage pain and dizziness.  Will be referred to urologist regarding microscopic hematuria which is likely benign.

## 2020-03-04 ENCOUNTER — Telehealth: Payer: Self-pay

## 2020-03-04 LAB — CBC WITH DIFFERENTIAL/PLATELET
Absolute Monocytes: 282 cells/uL (ref 200–950)
Basophils Absolute: 9 cells/uL (ref 0–200)
Basophils Relative: 0.3 %
Eosinophils Absolute: 99 cells/uL (ref 15–500)
Eosinophils Relative: 3.2 %
HCT: 39.4 % (ref 35.0–45.0)
Hemoglobin: 13.1 g/dL (ref 11.7–15.5)
Lymphs Abs: 1330 cells/uL (ref 850–3900)
MCH: 32.2 pg (ref 27.0–33.0)
MCHC: 33.2 g/dL (ref 32.0–36.0)
MCV: 96.8 fL (ref 80.0–100.0)
MPV: 12.2 fL (ref 7.5–12.5)
Monocytes Relative: 9.1 %
Neutro Abs: 1380 cells/uL — ABNORMAL LOW (ref 1500–7800)
Neutrophils Relative %: 44.5 %
Platelets: 209 10*3/uL (ref 140–400)
RBC: 4.07 10*6/uL (ref 3.80–5.10)
RDW: 11.9 % (ref 11.0–15.0)
Total Lymphocyte: 42.9 %
WBC: 3.1 10*3/uL — ABNORMAL LOW (ref 3.8–10.8)

## 2020-03-04 LAB — COMPLETE METABOLIC PANEL WITH GFR
AG Ratio: 1.2 (calc) (ref 1.0–2.5)
ALT: 17 U/L (ref 6–29)
AST: 22 U/L (ref 10–35)
Albumin: 4.2 g/dL (ref 3.6–5.1)
Alkaline phosphatase (APISO): 66 U/L (ref 37–153)
BUN: 10 mg/dL (ref 7–25)
CO2: 28 mmol/L (ref 20–32)
Calcium: 9.2 mg/dL (ref 8.6–10.4)
Chloride: 104 mmol/L (ref 98–110)
Creat: 0.87 mg/dL (ref 0.50–1.05)
GFR, Est African American: 85 mL/min/{1.73_m2} (ref >=60)
GFR, Est Non African American: 73 mL/min/{1.73_m2} (ref >=60)
Globulin: 3.4 g/dL (calc) (ref 1.9–3.7)
Glucose, Bld: 80 mg/dL (ref 65–99)
Potassium: 4.1 mmol/L (ref 3.5–5.3)
Sodium: 141 mmol/L (ref 135–146)
Total Bilirubin: 0.4 mg/dL (ref 0.2–1.2)
Total Protein: 7.6 g/dL (ref 6.1–8.1)

## 2020-03-04 LAB — T4, FREE: Free T4: 1 ng/dL (ref 0.8–1.8)

## 2020-03-04 LAB — URINALYSIS, MICROSCOPIC ONLY

## 2020-03-04 LAB — TSH: TSH: 1.01 mIU/L (ref 0.40–4.50)

## 2020-03-04 MED FILL — GABAPENTIN 600 MG TABLET: 600 | 30 days supply | Qty: 90 | Fill #2

## 2020-03-04 NOTE — Telephone Encounter (Signed)
Called insurance and started Prior auth. All notes faxed as well. Waiting on auth status

## 2020-03-05 ENCOUNTER — Encounter: Payer: Self-pay | Admitting: Obstetrics and Gynecology

## 2020-03-07 ENCOUNTER — Encounter: Payer: No Typology Code available for payment source | Admitting: Rehabilitative and Restorative Service Providers"

## 2020-03-07 NOTE — Telephone Encounter (Signed)
Patient would like to schedule appointment.

## 2020-03-08 MED FILL — tiZANidine HCL 4 MG TABS: 4 | 13 days supply | Qty: 40 | Fill #1

## 2020-03-08 MED FILL — CYANOCOBALAMIN 1,000 MCG/ML: 1000 | 90 days supply | Qty: 3 | Fill #1

## 2020-03-08 NOTE — Telephone Encounter (Signed)
Patient scheduled for January 12 at 12:00.

## 2020-03-09 ENCOUNTER — Encounter: Payer: Self-pay | Admitting: Obstetrics and Gynecology

## 2020-03-09 ENCOUNTER — Other Ambulatory Visit: Payer: Self-pay

## 2020-03-09 ENCOUNTER — Other Ambulatory Visit: Payer: Self-pay | Admitting: Obstetrics and Gynecology

## 2020-03-09 ENCOUNTER — Ambulatory Visit: Payer: No Typology Code available for payment source | Admitting: Obstetrics and Gynecology

## 2020-03-09 ENCOUNTER — Encounter: Payer: No Typology Code available for payment source | Admitting: Rehabilitative and Restorative Service Providers"

## 2020-03-09 VITALS — BP 118/66 | HR 72 | Resp 16 | Ht 67.5 in | Wt 263.0 lb

## 2020-03-09 DIAGNOSIS — R3129 Other microscopic hematuria: Secondary | ICD-10-CM

## 2020-03-09 DIAGNOSIS — N760 Acute vaginitis: Secondary | ICD-10-CM

## 2020-03-09 LAB — WET PREP FOR TRICH, YEAST, CLUE

## 2020-03-09 MED ORDER — HYDROCORTISONE ACETATE 25 MG RE SUPP: RECTAL | 0 refills | Status: DC

## 2020-03-09 MED FILL — HYDROCORTISONE AC 25 MG SUP: 25 | 14 days supply | Qty: 14 | Fill #0

## 2020-03-09 MED FILL — LEVOTHYROXINE SODIUM 112 MC: 112 | 90 days supply | Qty: 90 | Fill #2

## 2020-03-09 NOTE — Progress Notes (Signed)
GYNECOLOGY  VISIT   HPI: 60 y.o.   Married Black or [place] American Not Hispanic or Latino  female   [phone] with No LMP recorded. Patient has had a hysterectomy.   here for check infection.     The patient was treated for DIV last month with one month of steroid vaginal suppositories. Her symptoms resolved with this treatment.  Over the weekend she has started having some vaginal itching (not external) and a slight yellow vaginal d/c. No odor. She hasn't been sexually active for years. In 11/21 she had negative STD and vaginitis testing.   She was previously seen at Urology in 2011 for hematuria. She only had a cystoscopy, didn't have any evaluation of her kidneys. She continues to have hematuria. She would like referral back to Urology.   GYNECOLOGIC HISTORY: No LMP recorded. Patient has had a hysterectomy. Contraception:Hysterectomy Menopausal hormone therapy: Estring        OB History    Gravida  2   Para  1   Term      Preterm  1   AB  1   Living  2     SAB      IAB  1   Ectopic      Multiple  1   Live Births  2              Patient Active Problem List   Diagnosis Date Noted  . Status post arthroscopy of right knee 12/11/2019  . 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 Acute medial meniscal tear, right, subsequent encounter 08/27/2019  . History of total left knee replacement 07/16/2019  . Peroneal neuropathy at knee, left 06/25/2019  . Lumbosacral radiculopathy 06/07/2019  . 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 IDA (iron deficiency anemia) 10/16/2018  . OA (osteoarthritis) of knee 01/20/2018  . Acute meniscal tear, medial 07/17/2017  . 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 Insomnia 11/08/2016  . Pernicious anemia 11/08/2016  . Leukopenia 07/09/2016  . Hypocupremia 07/09/2016  . Palpitations 04/12/2016  . 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 Lap gastric bypass May 2016 07/06/2014  . History of Clostridium difficile infection 08/12/2011  . Postoperative hypothyroidism 08/26/2010  . Gastroesophageal reflux disease 08/26/2010  . Vitamin D deficiency 08/26/2010  . Fibrocystic disease of breast 08/26/2010  . Cobalamin deficiency 08/26/2010  . Obesity 03/20/2010  . Backache 03/20/2010    Past Medical History:  Diagnosis Date  . Bursitis    right shoulder  . Chronic leukopenia    intermittant since 2010, followed by Dr. Renold Genta now  . Degenerative joint disease of low back 09/02/15    Dr. Maureen Ralphs  . Fibrocystic breast   . GERD (gastroesophageal reflux disease)   . Headache    per pt more stress/tension  . Heart palpitations    SEE EPIC ENCOUTNER , CARDIOLOGY DR. Tressia Miners TURNER 2018; reports on 05-16-17 " i haven't had any bouts of those lately"    . Hemorrhoids   . History of Clostridium difficile infection 12/2010  . History of exercise intolerance    ETT on 04-27-2016-- negative ([facility] treadmill score 7)  . History of Helicobacter pylori infection 2001 and 09/ 2012  . HSV-2 infection    genital  . Hx of adenomatous colonic polyps 10/17/2007  . Hydradenitis    per pt currently treated with Humira injection  . Hypothyroidism, postsurgical 1980  .  Medial meniscus tear    right knee  . Osteoarthritis   . Pernicious anemia    b12 def  . PONV (postoperative nausea and vomiting)   . 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 Tendonitis    right shoulder  . Varicose veins   . Vitamin B 12 deficiency   . Vitamin D deficiency     Past Surgical History:  Procedure Laterality Date  . BREATH TEK H PYLORI N/A 05/03/2014   Procedure: BREATH TEK H PYLORI;  Surgeon: Kaylyn Lim, MD;  Location: [facility];  Service: General;  Laterality: N/A;  . CARDIOVASCULAR STRESS TEST  03/11/2013   dr Tressia Miners turner   Low risk nuclear  study w/ a very small anterior perfusion defect that most likely represents breast attenuation artifact, less likely this could represent a true perfusion abnormality in the distribution of diagonal branch of the LAD/  normal LV function and wall motion , ef 66%  . CATARACT EXTRACTION W/ INTRAOCULAR LENS  IMPLANT, BILATERAL  10/2017  . CESAREAN SECTION  1985  . CHOLECYSTECTOMY OPEN  1987  . COLONOSCOPY  02/2017   Dr [name] ; per patient they found 7 polyps with polypectomy; on 5 year track   . FINE NEEDLE ASPIRATION Left 05/22/2017   Procedure: LEFT KNEE ASPIRATION;  Surgeon: Gaynelle Arabian, MD;  Location: [facility];  Service: Orthopedics;  Laterality: Left;  Marland Kitchen GASTRIC ROUX-EN-Y N/A 07/06/2014   Procedure: LAPAROSCOPIC ROUX-EN-Y GASTRIC BYPASS, ENTEROLYSIS OF ADHESIONS WITH UPPER ENDOSCOPY;  Surgeon: Johnathan Hausen, MD;  Location: [facility];  Service: General;  Laterality: N/A;  . HAMMER TOE SURGERY Bilateral 02/17/2008   bilateral foot digit's 2 and 5  . HIATAL HERNIA REPAIR N/A 07/06/2014   Procedure: LAPAROSCOPIC REPAIR OF HIATAL HERNIA;  Surgeon: Johnathan Hausen, MD;  Location: [facility];  Service: General;  Laterality: N/A;  . I & D KNEE WITH POLY EXCHANGE Left 12/11/2019   Procedure: LEFT KNEE POLY-LINER EXCHANGE;  Surgeon: Mcarthur Rossetti, MD;  Location: [place];  Service: Orthopedics;  Laterality: Left;  . KNEE ARTHROSCOPY Right 12/11/2019   Procedure: RIGHT KNEE ARTHROSCOPY WITH PARTIAL MEDIAL MENISCECTOMY;  Surgeon: Mcarthur Rossetti, MD;  Location: [facility];  Service: Orthopedics;  Laterality: Right;  . KNEE ARTHROSCOPY WITH MEDIAL MENISECTOMY Left 07/17/2017   Procedure: LEFT KNEE ARTHROSCOPY WITH MEDIAL LATERAL MENISECTOMY;  Surgeon: Gaynelle Arabian, MD;  [facility];  Service: Orthopedics;  Laterality: Left;  Marland Kitchen MASS EXCISION N/A 10/11/2016   Procedure: EXCISION OF PERIANAL MASS;  Surgeon: [name], MD;  Location: [facility];  Service: General;  Laterality: N/A;   . REFRACTIVE SURGERY    . THYROIDECTOMY  1980  . TOTAL ABDOMINAL HYSTERECTOMY  08-25-2002   dr Margaretha Glassing   ovaries retained  . TOTAL HIP ARTHROPLASTY Right 05/21/2012   Procedure: TOTAL HIP ARTHROPLASTY;  Surgeon: Gearlean Alf, MD;  [facility];  Service: Orthopedics;  Laterality: Right;  . TOTAL HIP ARTHROPLASTY Left 01-31-2009  dr Wynelle Link  . TOTAL HIP REVISION Left 05/22/2017   Procedure: Left hip bearing surface and head revision;  Surgeon: Gaynelle Arabian, MD;  [facility];  Service: Orthopedics;  Laterality: Left;  . TOTAL KNEE ARTHROPLASTY Left 01/20/2018   Procedure: LEFT TOTAL KNEE ARTHROPLASTY;  Surgeon: Gaynelle Arabian, MD;  [facility];  Service: Orthopedics;  Laterality: Left;  35min  . TRANSTHORACIC ECHOCARDIOGRAM  05/03/2016   ef  55-60%/  trivial MR/  mild TR  . TUBAL LIGATION      Current Outpatient Medications  Medication Sig Dispense Refill  . Adalimumab (HUMIRA PEN) 40 MG/0.8ML PNKT Inject one pen under the skin each week. 4 each 5  . ALPRAZolam (XANAX) 1 MG tablet TAKE 1 TABLET BY MOUTH EVERY NIGHT AT BEDTIME (Patient taking differently: Take 1 mg by mouth at bedtime as needed for sleep.) 90 tablet 0  . betamethasone valerate ointment (VALISONE) 0.1 % Apply 1 application topically 2 (two) times daily. Can use for up to 2 weeks as needed 15 g 0  . Calcium Carb-Cholecalciferol (CALCIUM + D3 PO) Take 1 tablet by mouth daily.    . celecoxib (CELEBREX) 200 MG capsule Take 1 capsule (200 mg total) by mouth 2 (two) times daily between meals as needed. 60 capsule 1  . cyanocobalamin (,VITAMIN B-12,) 1000 MCG/ML injection INJECT 1 ML INTRAMUSCULARLY EVERY MONTH (Patient taking differently: Inject 1,000 mcg into the muscle every 30 (thirty) days. INJECT 1 ML INTRAMUSCULARLY EVERY MONTH) 3 mL 11  . DIVIGEL 1 MG/GM GEL APPLY TO THE SKIN AT BEDTIME (Patient taking differently: Apply 1 application topically at bedtime.) 90 g 4  . estradiol (ESTRING) 2 MG vaginal ring  Place 2 mg vaginally every 3 (three) months. follow package directions 1 each 3  . gabapentin (NEURONTIN) 600 MG tablet TAKE 1 TABLET BY MOUTH 3 TIMES DAILY. 90 tablet 2  . levothyroxine (SYNTHROID) 112 MCG tablet TAKE 1 TABLET BY MOUTH ONCE A DAY (Patient taking differently: Take 112 mcg by mouth daily at 2 am. During nighttime bathroom break) 90 tablet 2  . Multiple Vitamin (MULTIVITAMIN WITH MINERALS) TABS tablet Take 1 tablet by mouth daily.    . NURTEC 75 MG TBDP TAKE 75 MG BY MOUTH DAILY AS NEEDED FOR MIGRAINES. TAKE AS CLOSE TO ONSET OF MIGRAINE AS POSSIBLE. ONE DAILY MAXIMUM. 8 tablet 11  . pantoprazole (PROTONIX) 40 MG tablet TAKE 1 TABLET BY MOUTH TWICE DAILY--- PRN (Patient taking differently: Take 40 mg by mouth 2 (two) times daily as needed (indigestion/acid reflux.).)    . polyethylene glycol (MIRALAX / GLYCOLAX) packet Take 17 g by mouth daily as needed for mild constipation.    . promethazine (PHENERGAN) 25 MG tablet TAKE 1 TABLET BY MOUTH EVERY 8 HOURS AS NEEDED FOR NAUSEA AND VOMITING (Patient taking differently: Take 25 mg by mouth every 8 (eight) hours as needed for nausea or vomiting.) 30 tablet 5  . tiZANidine (ZANAFLEX) 4 MG tablet TAKE 1 TABLET BY MOUTH EVERY 8 HOURS AS NEEDED FOR MUSCLE SPASMS 40 tablet 1  . traZODone (DESYREL) 50 MG tablet Take 1 tablet (50 mg total) by mouth at bedtime. (Patient taking differently: Take 50 mg by mouth at bedtime as needed for sleep.) 30 tablet 1  . valACYclovir (VALTREX) 500 MG tablet TAKE 1 TABLET BY MOUTH ONCE DAILY (Patient taking differently: Take 500 mg by mouth daily as needed (outbreaks).) 90 tablet 3  . HYDROcodone-acetaminophen (NORCO) 10-325 MG tablet One tab every 8 hours as needed for knee pain (Patient not taking: Reported on 03/09/2020) 15 tablet 0   No current facility-administered medications for this visit.   Facility-Administered Medications Ordered in Other Visits  Medication Dose Route Frequency Provider Last Rate Last  Admin  . gadobenate dimeglumine (MULTIHANCE) injection 20 mL  20 mL Intravenous Once PRN Melvenia Beam, MD         ALLERGIES: Other and Estradiol  Family History  Problem  Relation Age of Onset  . Diabetes Father   . Diabetes Sister   . Hypertension Sister   . Hypertension Mother   . Headache Mother     Social History   Socioeconomic History  . Marital status: Married    Spouse name: Not on file  . Number of children: 2  . Years of education: Not on file  . Highest education level: Not on file  Occupational History  . Occupation: nurse  Tobacco Use  . Smoking status: Never Smoker  . Smokeless tobacco: Never Used  Vaping Use  . Vaping Use: Never used  Substance and Sexual Activity  . Alcohol use: No  . Drug use: No  . Sexual activity: Not Currently    Partners: Male    Birth control/protection: Surgical    Comment: hysterectomy  Other Topics Concern  . Not on file  Social History Narrative   Lives at home with spouse   Right handed   Caffeine: diet zero mtn dew, occasionally    Social Determinants of Health   Financial Resource Strain: Not on file  Food Insecurity: Not on file  Transportation Needs: Not on file  Physical Activity: Not on file  Stress: Not on file  Social Connections: Not on file  Intimate Partner Violence: Not on file    Review of Systems  Constitutional: Negative.   HENT: Negative.   Eyes: Negative.   Respiratory: Negative.   Cardiovascular: Negative.   Gastrointestinal: Negative.   Genitourinary:       Vaginal itching  Musculoskeletal: Negative.   Skin: Negative.   Neurological: Negative.   Endo/Heme/Allergies: Negative.   Psychiatric/Behavioral: Negative.     PHYSICAL EXAMINATION:    BP 118/66 (BP Location: Left Arm, Patient Position: Sitting, Cuff Size: Large)   Pulse 72   Resp 16   Ht 5' 7.5" (1.715 m)   Wt 263 lb (119.3 kg)   BMI 40.58 kg/m     General appearance: alert, cooperative and appears stated  age  Pelvic: External genitalia:  no lesions              Urethra:  normal appearing urethra with no masses, tenderness or lesions              Bartholins and Skenes: normal                 Vagina: mildly atrophic appearing vagina with mild erythema and an increase in yellow vaginal d/c with bubbles.               Cervix: no lesions              Chaperone was present for exam.  Wet prep: no clue, no trich, no yeast, there were a large # of WBC, not as many as previously. ~1:1 ratio of epithelial cells to WBC Vaginal pH: 4  1. Acute vaginitis Recently treated for DIV with excellent results. Symptoms have returned mildly. Recent negative nuswab for BV and Trich, not seen on wet prep today and not sexually active so she shouldn't have developed trich Doesn't quite reach diagnosis for DIV (pH at 4). Given her response before will treat with steroids for 2 weeks. - WET PREP FOR TRICH, YEAST, CLUE - hydrocortisone (ANUSOL-HC) 25 MG suppository; Place one suppository VAGINALLY daily for 14 days  Dispense: 14 suppository; Refill: 0 - Candida albicans, DNA -If she develops recurrent DIV will treat with Clindamycin  2. Microscopic hematuria  - Urinalysis,Complete w/RFL Culture - Ambulatory  referral to Urology

## 2020-03-10 ENCOUNTER — Telehealth: Payer: Self-pay

## 2020-03-10 ENCOUNTER — Other Ambulatory Visit: Payer: Self-pay

## 2020-03-10 ENCOUNTER — Encounter: Payer: Self-pay | Admitting: Obstetrics and Gynecology

## 2020-03-10 NOTE — Telephone Encounter (Signed)
Called and left a VM for Marcie Bal to return my call concerning office notes needed.

## 2020-03-10 NOTE — Telephone Encounter (Signed)
Faxed most recent office notes to Rachael Darby with [employer] at [phone] and [phone]. Called and left a VM  stating that clarification is needed for gel injection for patient.

## 2020-03-10 NOTE — Telephone Encounter (Signed)
Victoria [name] from [employer] called she stated she received old clinical notes but she needs new clinical notes, she is requesting office notes after 11/29 that addresses monovisc injection. [phone]

## 2020-03-10 NOTE — Telephone Encounter (Signed)
Victoria [name] called returning April's call and states she needs the following: Office notes where Dr.Duda addressed ordering the monovisc and it needs to be after 01/25/20 and she will also need the order for the monovisc.   Fax# [phone] Back UP Fax# [phone]

## 2020-03-11 LAB — URINALYSIS, COMPLETE W/RFL CULTURE
Bilirubin Urine: NEGATIVE
Glucose, UA: NEGATIVE
Hyaline Cast: NONE SEEN /LPF
Ketones, ur: NEGATIVE
Nitrites, Initial: NEGATIVE
Protein, ur: NEGATIVE
Specific Gravity, Urine: 1.02 (ref 1.001–1.03)
pH: 5.5 (ref 5.0–8.0)

## 2020-03-11 LAB — URINE CULTURE
MICRO NUMBER:: 11409525
Result:: NO GROWTH
SPECIMEN QUALITY:: ADEQUATE

## 2020-03-11 LAB — CULTURE INDICATED

## 2020-03-14 ENCOUNTER — Encounter: Payer: No Typology Code available for payment source | Admitting: Physical Therapy

## 2020-03-16 ENCOUNTER — Encounter: Payer: No Typology Code available for payment source | Admitting: Rehabilitative and Restorative Service Providers"

## 2020-03-17 LAB — C ALBICANS, DNA: C. albicans, DNA: NOT DETECTED

## 2020-03-18 ENCOUNTER — Encounter: Payer: Self-pay | Admitting: Orthopaedic Surgery

## 2020-03-18 ENCOUNTER — Other Ambulatory Visit: Payer: Self-pay | Admitting: Orthopaedic Surgery

## 2020-03-18 MED ORDER — TIZANIDINE HCL 4 MG PO TABS
ORAL_TABLET | ORAL | 1 refills | Status: DC
Start: 2020-03-18 — End: 2020-05-25

## 2020-03-18 MED FILL — tiZANidine HCL 4 MG TABS: 4 | 13 days supply | Qty: 40 | Fill #0

## 2020-03-19 NOTE — Patient Instructions (Addendum)
To have chest x-ray and rib detail films (results were negative).  She will be referred to Urologist regarding microscopic hematuria.  She will discontinue gabapentin as it may be causing dizziness and orthostasis.  She will try Norco 10/325 1/2 tablet very sparingly every 8-12 hours if having pain number 15 tablets given.  Her physical exam is due here in May.  She may call if she has questions and concerns in the meantime.

## 2020-03-21 ENCOUNTER — Telehealth: Payer: Self-pay | Admitting: Internal Medicine

## 2020-03-21 NOTE — Telephone Encounter (Signed)
Patient will also be contacting [employer] about upcoming appointments with Loreli Slot, NP with [facility] and Laverna Peace, NP with [facility] about referrals for 2022 year.

## 2020-03-21 NOTE — Telephone Encounter (Signed)
Referrals are placed.

## 2020-03-21 NOTE — Telephone Encounter (Signed)
Referrals have been placed for [facility] and To Dr Radford Pax and [facility]. Patient needs referrals documented for [facility] plan insurance purposes.

## 2020-03-21 NOTE — Telephone Encounter (Signed)
Monovisc  rep was unable to verify benefits. I will have to call insurance company to find out copay and how much they will cover. But did receive letter of approval

## 2020-03-21 NOTE — Telephone Encounter (Signed)
Received Approval letter.

## 2020-03-22 NOTE — Telephone Encounter (Signed)
Called pts insurance  Approved for Monovisc-Right knee Buy and Bill Dr. Ninfa Linden No copay Covered @ 100% Prior auth # [id number] Dates: 03/10/20-04/08/20

## 2020-03-23 MED FILL — HUMIRA PEN 40 MG/0.8ML PNKT: 40 | 28 days supply | Qty: 4 | Fill #3

## 2020-03-28 ENCOUNTER — Ambulatory Visit (INDEPENDENT_AMBULATORY_CARE_PROVIDER_SITE_OTHER): Payer: No Typology Code available for payment source | Admitting: Orthopaedic Surgery

## 2020-03-28 ENCOUNTER — Telehealth: Payer: Self-pay | Admitting: Orthopaedic Surgery

## 2020-03-28 ENCOUNTER — Other Ambulatory Visit: Payer: Self-pay

## 2020-03-28 ENCOUNTER — Encounter: Payer: Self-pay | Admitting: Orthopaedic Surgery

## 2020-03-28 ENCOUNTER — Other Ambulatory Visit: Payer: Self-pay | Admitting: Internal Medicine

## 2020-03-28 DIAGNOSIS — M1711 Unilateral primary osteoarthritis, right knee: Secondary | ICD-10-CM | POA: Diagnosis not present

## 2020-03-28 DIAGNOSIS — Z9889 Other specified postprocedural states: Secondary | ICD-10-CM

## 2020-03-28 DIAGNOSIS — M25561 Pain in right knee: Secondary | ICD-10-CM

## 2020-03-28 DIAGNOSIS — G8929 Other chronic pain: Secondary | ICD-10-CM

## 2020-03-28 MED ORDER — GABAPENTIN 600 MG PO TABS: 600.0000 mg | ORAL_TABLET | Freq: Three times a day (TID) | ORAL | 0 refills | Status: DC

## 2020-03-28 MED ORDER — HYALURONAN 88 MG/4ML IX SOSY
88.0000 mg | PREFILLED_SYRINGE | INTRA_ARTICULAR | Status: AC | PRN
  Administered 2020-03-28: 88 mg via INTRA_ARTICULAR

## 2020-03-28 MED FILL — GABAPENTIN 600 MG TABLET: 600 | 30 days supply | Qty: 90 | Fill #0

## 2020-03-28 NOTE — Telephone Encounter (Signed)
Received vm from [facility] w/ [place] [phone]. He stated he had question about "last ov note" . IC,lmvm advised to call back and leave more detailed message or he can fax his specific inquires to [place]

## 2020-03-28 NOTE — Progress Notes (Signed)
   Procedure Note  Patient: Victoria [name]             Date of Birth: [birth year]           MRN: [id number]             Visit Date: 03/28/2020  Procedures: Visit Diagnoses:  1. Status post arthroscopy of right knee   2. Chronic pain of right knee   3. Unilateral primary osteoarthritis, right knee     Large Joint Inj: L knee on 03/28/2020 8:28 AM Indications: diagnostic evaluation and pain Details: 22 G 1.5 in needle, superolateral approach  Arthrogram: No  Medications: 88 mg Hyaluronan 88 MG/4ML Outcome: tolerated well, no immediate complications Procedure, treatment alternatives, risks and benefits explained, specific risks discussed. Consent was given by the patient. Immediately prior to procedure a time out was called to verify the correct patient, procedure, equipment, support staff and site/side marked as required. Patient was prepped and draped in the usual sterile fashion.     The patient comes in today for scheduled hyaluronic acid injection with Monovisc in her right knee to treat the pain from osteoarthritis.  She has tried and failed other forms conservative treatment including knee arthroscopy and steroid injections.  She understands fully why we are trying this injection today.  There is no knee effusion today of her right knee.  There is mainly medial tenderness.  Her knee moves well.  I did place Monovisc in her right knee today without difficulty.  All questions and concerns were answered and addressed.  I will reevaluate her in 8 weeks.  I gave her a note to return to work starting April 12, 2020 but 1/2 days only for the first 8 weeks.

## 2020-03-30 ENCOUNTER — Other Ambulatory Visit: Payer: Self-pay | Admitting: Internal Medicine

## 2020-03-30 DIAGNOSIS — Z1231 Encounter for screening mammogram for malignant neoplasm of breast: Secondary | ICD-10-CM

## 2020-04-04 MED FILL — DIVIGEL 1 MG GEL PACKET: 1 | 90 days supply | Qty: 90 | Fill #2

## 2020-04-05 ENCOUNTER — Telehealth (INDEPENDENT_AMBULATORY_CARE_PROVIDER_SITE_OTHER): Payer: No Typology Code available for payment source | Admitting: Cardiology

## 2020-04-05 ENCOUNTER — Telehealth: Payer: Self-pay

## 2020-04-05 ENCOUNTER — Other Ambulatory Visit: Payer: Self-pay | Admitting: Cardiology

## 2020-04-05 ENCOUNTER — Other Ambulatory Visit: Payer: Self-pay

## 2020-04-05 ENCOUNTER — Encounter: Payer: Self-pay | Admitting: Cardiology

## 2020-04-05 VITALS — BP 136/96 | HR 94 | Ht 66.5 in | Wt 260.0 lb

## 2020-04-05 DIAGNOSIS — R072 Precordial pain: Secondary | ICD-10-CM

## 2020-04-05 DIAGNOSIS — R0609 Other forms of dyspnea: Secondary | ICD-10-CM

## 2020-04-05 DIAGNOSIS — R002 Palpitations: Secondary | ICD-10-CM | POA: Diagnosis not present

## 2020-04-05 DIAGNOSIS — R079 Chest pain, unspecified: Secondary | ICD-10-CM

## 2020-04-05 DIAGNOSIS — R0600 Dyspnea, unspecified: Secondary | ICD-10-CM

## 2020-04-05 MED ORDER — METOPROLOL TARTRATE 100 MG PO TABS: ORAL_TABLET | ORAL | 0 refills | Status: DC

## 2020-04-05 MED FILL — METOPROLOL TARTRATE 100 MG: 100 | 1 days supply | Qty: 1 | Fill #0

## 2020-04-05 NOTE — Patient Instructions (Addendum)
Medication Instructions:  Your physician recommends that you continue on your current medications as directed. Please refer to the Current Medication list given to you today.  *If you need a refill on your cardiac medications before your next appointment, please call your pharmacy*   Testing/Procedures: Your physician has requested that you have an echocardiogram. Echocardiography is a painless test that uses sound waves to create images of your heart. It provides your doctor with information about the size and shape of your heart and how well your heart's chambers and valves are working. This procedure takes approximately one hour. There are no restrictions for this procedure.  Your physician has recommended that you wear an event monitor. Event monitors are medical devices that record the heart's electrical activity. Doctors most often [place] these monitors to diagnose arrhythmias. Arrhythmias are problems with the speed or rhythm of the heartbeat. The monitor is a small, portable device. You can wear one while you do your normal daily activities. This is usually used to diagnose what is causing palpitations/syncope (passing out).  Your physician has recommended that you have a coronary CTA scan. Please see next page for further instructions.    Follow-Up: At [facility], you and your health needs are our priority.  As part of our continuing mission to provide you with exceptional heart care, we have created designated Provider Care Teams.  These Care Teams include your primary Cardiologist (physician) and Advanced Practice Providers (APPs -  Physician Assistants and Nurse Practitioners) who all work together to provide you with the care you need, when you need it.  Follow up with Dr. Radford Pax as needed based on results of testing.    Other Instructions Your cardiac CT will be scheduled at:   [facility] [address] [place], [place] [address] [phone]   Please arrive  at the [facility] main entrance of [facility] 30 minutes prior to test start time. Proceed to the [facility] Radiology Department (first floor) to check-in and test prep.  Please follow these instructions carefully (unless otherwise directed):   On the Night Before the Test: . Be sure to Drink plenty of water. . Do not consume any caffeinated/decaffeinated beverages or chocolate 12 hours prior to your test. . Do not take any antihistamines 12 hours prior to your test.  On the Day of the Test: . Drink plenty of water. Do not drink any water within one hour of the test. . Do not eat any food 4 hours prior to the test. . You may take your regular medications prior to the test.  . Take metoprolol (Lopressor) two hours prior to test. . FEMALES- please wear underwire-free bra if available      After the Test: . Drink plenty of water. . After receiving IV contrast, you may experience a mild flushed feeling. This is normal. . On occasion, you may experience a mild rash up to 24 hours after the test. This is not dangerous. If this occurs, you can take Benadryl 25 mg and increase your fluid intake. . If you experience trouble breathing, this can be serious. If it is severe call 911 IMMEDIATELY. If it is mild, please call our office. . If you take any of these medications: Glipizide/Metformin, Avandament, Glucavance, please do not take 48 hours after completing test unless otherwise instructed.   Once we have confirmed authorization from your insurance company, we will call you to set up a date and time for your test. Based on  how quickly your insurance processes prior authorizations requests, please allow up to 4 weeks to be contacted for scheduling your Cardiac CT appointment. Be advised that routine Cardiac CT appointments could be scheduled as many as 8 weeks after your provider has ordered it.  For non-scheduling related questions, please contact the cardiac imaging nurse navigator  should you have any questions/concerns: Marchia Bond, Cardiac Imaging Nurse Navigator Burley Saver, Interim Cardiac Imaging Nurse [place] [facility] Direct Office Dial: [phone]   For scheduling needs, including cancellations and rescheduling, please call [place], [phone].

## 2020-04-05 NOTE — Progress Notes (Signed)
Virtual Cardiology Consult via Video Note   This visit type was conducted due to national recommendations for restrictions regarding the COVID-19 Pandemic (e.g. social distancing) in an effort to limit this patient's exposure and mitigate transmission in our community.  Due to her co-morbid illnesses, this patient is at least at moderate risk for complications without adequate follow up.  This format is felt to be most appropriate for this patient at this time.  All issues noted in this document were discussed and addressed.  A limited physical exam was performed with this format.  Please refer to the patient's chart for her consent to telehealth for [facility]  Date:  04/05/2020   ID:  Victoria [name], DOB 03-Jul-1960, MRN [id number] The patient was identified using 2 identifiers.  Patient Location: Home Provider Location: Home Office  PCP:  Elby Showers, MD  Cardiologist:  Fransico Him, MD  Electrophysiologist:  None   Evaluation Performed:  Cardiology Consult  Chief Complaint:  Palpitations/chest pain  History of Present Illness:    Victoria [name] is a 60 y.o. female with with a history of GERD, hiatal hernia and chest pain.  She is referred today by Dr. Renold Genta for evaluation of palpitations, chest pain and SOB.    She was initially see by me several years ago for chest pain and nuclear stress test showed low risk study with a very small anterior perfusion defect that most likely represents breast attenuation artifact. Less likely this could represent a true perfusion abnormality in the distribution of a diagonal artery branch of the LAD. Her EF was normal. She was started on Protonix for possible GERD symptoms and CP resolved.  She last saw me in 2018 at which time she was having SOB.  She had a gastric bypass in 2016 and had not felt well since then.  Her DOE was with talking and walking and also was having some epigastric pain at night.  2D echo was  normal which was normal. She had an ETT done which showed no ischemia.    She recently was seen by her GYN MD and was noted to have a low HR in the 40's.  She has been monitoring her heart beat recently and has noted that she has been having some fluttering in her chest and this is followed by the need to cough.  Her PCP did some orthostatic BPs recently due to dizziness and her BP dropped from 120/80 to 100/71mmHg.  She tells me that she is noticing the fluttering a few times weekly and usually lasts until she coughs.  She usually has been laying down in the bed when she feels the palpitations.  She has had some chest tightness and some pain going down her neck on the left side in the past 2 weeks but they are not associated with each other.  She has also had some nausea but not necessarily when she gets the chest pain.  She has been having some DOE when moving around doing housework and is new for her but does not get chest pain with it.   The patient does not have symptoms concerning for COVID-19 infection (fever, chills, cough, or new shortness of breath).    Past Medical History:  Diagnosis Date  . Bursitis    right shoulder  . Chronic leukopenia    intermittant since 2010, followed by Dr. Renold Genta now  . Degenerative joint disease of low back 09/02/15    Dr. Maureen Ralphs  .  Fibrocystic breast   . GERD (gastroesophageal reflux disease)   . Headache    per pt more stress/tension  . Heart palpitations    SEE EPIC ENCOUTNER , CARDIOLOGY DR. Tressia Miners Shandell Jallow 2018; reports on 05-16-17 " i haven't had any bouts of those lately"    . Hemorrhoids   . History of Clostridium difficile infection 12/2010  . History of exercise intolerance    ETT on 04-27-2016-- negative ([facility] treadmill score 7)  . History of Helicobacter pylori infection 2001 and 09/ 2012  . HSV-2 infection    genital  . Hx of adenomatous colonic polyps 10/17/2007  . Hydradenitis    per pt currently treated with Humira injection  .  Hypothyroidism, postsurgical 1980  . Medial meniscus tear    right knee  . Osteoarthritis   . Pernicious anemia    b12 def  . PONV (postoperative nausea and vomiting)   . 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 Tendonitis    right shoulder  . Varicose veins   . Vitamin B 12 deficiency   . Vitamin D deficiency    Past Surgical History:  Procedure Laterality Date  . BREATH TEK H PYLORI N/A 05/03/2014   Procedure: BREATH TEK H PYLORI;  Surgeon: Kaylyn Lim, MD;  Location: [facility];  Service: General;  Laterality: N/A;  . CARDIOVASCULAR STRESS TEST  03/11/2013   dr Tressia Miners Ashan Cueva   Low risk nuclear study w/ a very small anterior perfusion defect that most likely represents breast attenuation artifact, less likely this could represent a true perfusion abnormality in the distribution of diagonal branch of the LAD/  normal LV function and wall motion , ef 66%  . CATARACT EXTRACTION W/ INTRAOCULAR LENS  IMPLANT, BILATERAL  10/2017  . CESAREAN SECTION  1985  . CHOLECYSTECTOMY OPEN  1987  . COLONOSCOPY  02/2017   Dr [name] ; per patient they found 7 polyps with polypectomy; on 5 year track   . FINE NEEDLE ASPIRATION Left 05/22/2017   Procedure: LEFT KNEE ASPIRATION;  Surgeon: Gaynelle Arabian, MD;  Location: [facility];  Service: Orthopedics;  Laterality: Left;  Marland Kitchen GASTRIC ROUX-EN-Y N/A 07/06/2014   Procedure: LAPAROSCOPIC ROUX-EN-Y GASTRIC BYPASS, ENTEROLYSIS OF ADHESIONS WITH UPPER ENDOSCOPY;  Surgeon: Johnathan Hausen, MD;  Location: [facility];  Service: General;  Laterality: N/A;  . HAMMER TOE SURGERY Bilateral 02/17/2008   bilateral foot digit's 2 and 5  . HIATAL HERNIA REPAIR N/A 07/06/2014   Procedure: LAPAROSCOPIC REPAIR OF HIATAL HERNIA;  Surgeon: Johnathan Hausen, MD;  Location: [facility];  Service: General;  Laterality: N/A;  . I & D KNEE WITH POLY EXCHANGE Left 12/11/2019   Procedure: LEFT KNEE POLY-LINER EXCHANGE;   Surgeon: Mcarthur Rossetti, MD;  Location: [place];  Service: Orthopedics;  Laterality: Left;  . KNEE ARTHROSCOPY Right 12/11/2019   Procedure: RIGHT KNEE ARTHROSCOPY WITH PARTIAL MEDIAL MENISCECTOMY;  Surgeon: Mcarthur Rossetti, MD;  Location: [place];  Service: Orthopedics;  Laterality: Right;  . KNEE ARTHROSCOPY WITH MEDIAL MENISECTOMY Left 07/17/2017   Procedure: LEFT KNEE ARTHROSCOPY WITH MEDIAL LATERAL MENISECTOMY;  Surgeon: Gaynelle Arabian, MD;  [facility];  Service: Orthopedics;  Laterality: Left;  Marland Kitchen MASS EXCISION N/A 10/11/2016   Procedure: EXCISION OF PERIANAL MASS;  Surgeon: [name], MD;  Location: [place];  Service: General;  Laterality: N/A;  . REFRACTIVE SURGERY    . THYROIDECTOMY  1980  .  TOTAL ABDOMINAL HYSTERECTOMY  08-25-2002   dr Margaretha Glassing   ovaries retained  . TOTAL HIP ARTHROPLASTY Right 05/21/2012   Procedure: TOTAL HIP ARTHROPLASTY;  Surgeon: Gearlean Alf, MD;  [facility];  Service: Orthopedics;  Laterality: Right;  . TOTAL HIP ARTHROPLASTY Left 01-31-2009  dr Wynelle Link  . TOTAL HIP REVISION Left 05/22/2017   Procedure: Left hip bearing surface and head revision;  Surgeon: Gaynelle Arabian, MD;  [facility];  Service: Orthopedics;  Laterality: Left;  . TOTAL KNEE ARTHROPLASTY Left 01/20/2018   Procedure: LEFT TOTAL KNEE ARTHROPLASTY;  Surgeon: Gaynelle Arabian, MD;  [facility];  Service: Orthopedics;  Laterality: Left;  48min  . TRANSTHORACIC ECHOCARDIOGRAM  05/03/2016   ef 55-60%/  trivial MR/  mild TR  . TUBAL LIGATION       Current Meds  Medication Sig  . Adalimumab (HUMIRA PEN) 40 MG/0.8ML PNKT Inject one pen under the skin each week.  . ALPRAZolam (XANAX) 1 MG tablet TAKE 1 TABLET BY MOUTH EVERY NIGHT AT BEDTIME (Patient taking differently: Take 1 mg by mouth at bedtime as needed for sleep.)  . Calcium Carb-Cholecalciferol (CALCIUM + D3 PO) Take 1 tablet by mouth daily.  . celecoxib (CELEBREX) 200 MG capsule  Take 1 capsule (200 mg total) by mouth 2 (two) times daily between meals as needed.  . cyanocobalamin (,VITAMIN B-12,) 1000 MCG/ML injection INJECT 1 ML INTRAMUSCULARLY EVERY MONTH (Patient taking differently: Inject 1,000 mcg into the muscle every 30 (thirty) days. INJECT 1 ML INTRAMUSCULARLY EVERY MONTH)  . DIVIGEL 1 MG/GM GEL APPLY TO THE SKIN AT BEDTIME (Patient taking differently: Apply 1 application topically at bedtime.)  . gabapentin (NEURONTIN) 600 MG tablet Take 1 tablet (600 mg total) by mouth 3 (three) times daily.  Marland Kitchen levothyroxine (SYNTHROID) 112 MCG tablet TAKE 1 TABLET BY MOUTH ONCE A DAY (Patient taking differently: Take 112 mcg by mouth daily at 2 am. During nighttime bathroom break)  . Multiple Vitamin (MULTIVITAMIN WITH MINERALS) TABS tablet Take 1 tablet by mouth daily.  . NURTEC 75 MG TBDP TAKE 75 MG BY MOUTH DAILY AS NEEDED FOR MIGRAINES. TAKE AS CLOSE TO ONSET OF MIGRAINE AS POSSIBLE. ONE DAILY MAXIMUM.  Marland Kitchen pantoprazole (PROTONIX) 40 MG tablet TAKE 1 TABLET BY MOUTH TWICE DAILY--- PRN (Patient taking differently: Take 40 mg by mouth 2 (two) times daily as needed (indigestion/acid reflux.).)  . polyethylene glycol (MIRALAX / GLYCOLAX) packet Take 17 g by mouth daily as needed for mild constipation.  . promethazine (PHENERGAN) 25 MG tablet TAKE 1 TABLET BY MOUTH EVERY 8 HOURS AS NEEDED FOR NAUSEA AND VOMITING (Patient taking differently: Take 25 mg by mouth every 8 (eight) hours as needed for nausea or vomiting.)  . tiZANidine (ZANAFLEX) 4 MG tablet TAKE 1 TABLET BY MOUTH EVERY 8 HOURS AS NEEDED FOR MUSCLE SPASMS  . traZODone (DESYREL) 50 MG tablet Take 1 tablet (50 mg total) by mouth at bedtime. (Patient taking differently: Take 50 mg by mouth at bedtime as needed for sleep.)  . valACYclovir (VALTREX) 500 MG tablet TAKE 1 TABLET BY MOUTH ONCE DAILY (Patient taking differently: Take 500 mg by mouth daily as needed (outbreaks).)     Allergies:   Other and Estradiol   Social  History   Tobacco Use  . Smoking status: Never Smoker  . Smokeless tobacco: Never Used  Vaping Use  . Vaping Use: Never used  Substance Use Topics  . Alcohol use: No  . Drug use: No  Family Hx: The patient's family history includes Diabetes in her father and sister; Headache in her mother; Hypertension in her mother and sister.  ROS:   Please see the history of present illness.     All other systems reviewed and are negative.   Prior CV studies:   The following studies were reviewed today:  2D echo 2018 Study Conclusions   - Left ventricle: The cavity size was normal. Wall thickness was  normal. Systolic function was normal. The estimated ejection  fraction was in the range of 55% to 60%. Left ventricular  diastolic function parameters were normal.  Labs/Other Tests and Data Reviewed:    EKG:  No ECG reviewed.  Recent Labs: 03/03/2020: ALT 17; BUN 10; Creat 0.87; Hemoglobin 13.1; Platelets 209; Potassium 4.1; Sodium 141; TSH 1.01   Recent Lipid Panel Lab Results  Component Value Date/Time   CHOL 204 (H) 04/09/2019 09:27 AM   TRIG 46 04/09/2019 09:27 AM   HDL 89 04/09/2019 09:27 AM   CHOLHDL 2.3 04/09/2019 09:27 AM   LDLCALC 101 (H) 04/09/2019 09:27 AM    Wt Readings from Last 3 Encounters:  04/05/20 260 lb (117.9 kg)  03/09/20 263 lb (119.3 kg)  03/03/20 263 lb (119.3 kg)     Risk Assessment/Calculations:      Objective:    Vital Signs:  BP (!) 136/96   Pulse 94   Ht 5' 6.5" (1.689 m)   Wt 260 lb (117.9 kg)   BMI 41.34 kg/m    VITAL SIGNS:  reviewed GEN:  no acute distress EYES:  sclerae anicteric, EOMI - Extraocular Movements Intact RESPIRATORY:  normal respiratory effort, symmetric expansion CARDIOVASCULAR:  no peripheral edema SKIN:  no rash, lesions or ulcers. MUSCULOSKELETAL:  no obvious deformities. NEURO:  alert and oriented x 3, no obvious focal deficit PSYCH:  normal affect  ASSESSMENT & PLAN:    1. Palpitations -this  sounds like she may be having PVCs -I will get a 30 day event monitor to assess  -encouraged her to cut back on caffeine  2.  Chest pain -unclear etiology and is nonexertional but is a tightness and achiness -I will get a coronary CTA to define coronary anatomy  3.  DOE -2D echo in 2018 was normal -repeat echo to make sure LVF is normal  COVID-19 Education: The signs and symptoms of COVID-19 were discussed with the patient and how to seek care for testing (follow up with PCP or arrange E-visit).  The importance of social distancing was discussed today.  Time:   Today, I have spent 20 minutes with the patient with telehealth technology discussing the above problems.     Medication Adjustments/Labs and Tests Ordered: Current medicines are reviewed at length with the patient today.  Concerns regarding medicines are outlined above.   Tests Ordered: No orders of the defined types were placed in this encounter.   Medication Changes: No orders of the defined types were placed in this encounter.   Follow Up:  prn  Signed, Fransico Him, MD  04/05/2020 8:50 AM    [place]

## 2020-04-05 NOTE — Telephone Encounter (Signed)
  Patient Consent for Virtual Visit         Victoria [name] has provided verbal consent on 04/05/2020 for a virtual visit (video or telephone).   CONSENT FOR VIRTUAL VISIT FOR:  Victoria [name]  By participating in this virtual visit I agree to the following:  I hereby voluntarily request, consent and authorize [facility] and its employed or contracted physicians, physician assistants, nurse practitioners or other licensed health care professionals (the Practitioner), to provide me with telemedicine health care services (the "Services") as deemed necessary by the treating Practitioner. I acknowledge and consent to receive the Services by the Practitioner via telemedicine. I understand that the telemedicine visit will involve communicating with the Practitioner through live audiovisual communication technology and the disclosure of certain medical information by electronic transmission. I acknowledge that I have been given the opportunity to request an in-person assessment or other available alternative prior to the telemedicine visit and am voluntarily participating in the telemedicine visit.  I understand that I have the right to withhold or withdraw my consent to the use of telemedicine in the course of my care at any time, without affecting my right to future care or treatment, and that the Practitioner or I may terminate the telemedicine visit at any time. I understand that I have the right to inspect all information obtained and/or recorded in the course of the telemedicine visit and may receive copies of available information for a reasonable fee.  I understand that some of the potential risks of receiving the Services via telemedicine include:  Marland Kitchen Delay or interruption in medical evaluation due to technological equipment failure or disruption; . Information transmitted may not be sufficient (e.g. poor resolution of images) to allow for appropriate medical decision making  by the Practitioner; and/or  . In rare instances, security protocols could fail, causing a breach of personal health information.  Furthermore, I acknowledge that it is my responsibility to provide information about my medical history, conditions and care that is complete and accurate to the best of my ability. I acknowledge that Practitioner's advice, recommendations, and/or decision may be based on factors not within their control, such as incomplete or inaccurate data provided by me or distortions of diagnostic images or specimens that may result from electronic transmissions. I understand that the practice of medicine is not an exact science and that Practitioner makes no warranties or guarantees regarding treatment outcomes. I acknowledge that a copy of this consent can be made available to me via my patient portal ([place]), or I can request a printed copy by calling the office of [place].    I understand that my insurance will be billed for this visit.   I have read or had this consent read to me. . I understand the contents of this consent, which adequately explains the benefits and risks of the Services being provided via telemedicine.  . I have been provided ample opportunity to ask questions regarding this consent and the Services and have had my questions answered to my satisfaction. . I give my informed consent for the services to be provided through the use of telemedicine in my medical care

## 2020-04-07 MED FILL — NURTEC 75 MG TBDP: 75 | 30 days supply | Qty: 8 | Fill #1

## 2020-04-07 MED FILL — SHINGRIX 50 MCG SUS: 50 | 1 days supply | Qty: 1 | Fill #1

## 2020-04-11 ENCOUNTER — Telehealth: Payer: Self-pay | Admitting: Cardiology

## 2020-04-11 ENCOUNTER — Telehealth: Payer: Self-pay

## 2020-04-11 NOTE — Telephone Encounter (Signed)
Patient said that her insurance does not cover the long term monitor. Patient wanted to know if the office worked with another company that may be covered by her insurance. She also sent a MyChart message as well

## 2020-04-11 NOTE — Telephone Encounter (Signed)
Disability form received from [facility] and sent to [employer]

## 2020-04-13 ENCOUNTER — Ambulatory Visit (INDEPENDENT_AMBULATORY_CARE_PROVIDER_SITE_OTHER): Payer: No Typology Code available for payment source

## 2020-04-13 ENCOUNTER — Other Ambulatory Visit: Payer: Self-pay

## 2020-04-13 ENCOUNTER — Telehealth (HOSPITAL_COMMUNITY): Payer: Self-pay | Admitting: Emergency Medicine

## 2020-04-13 ENCOUNTER — Telehealth (HOSPITAL_COMMUNITY): Payer: Self-pay | Admitting: *Deleted

## 2020-04-13 DIAGNOSIS — R002 Palpitations: Secondary | ICD-10-CM

## 2020-04-13 NOTE — Telephone Encounter (Signed)
Attempted to call patient regarding upcoming cardiac CT appointment. °Left message on voicemail with name and callback number °Chaunta Bejarano RN Navigator Cardiac Imaging °[facility] °[phone] Office °[phone] Cell ° °

## 2020-04-13 NOTE — Telephone Encounter (Signed)
[employer] enrollment to be cancelled.  Patient shipped monitor back to [employer]. New order for Long term monitor 14 day (ZIO XT) placed.  [employer]/ ZIO in network with [employer].  Patients copay will be $50.00 if she calls [employer] at [phone] or [phone] to initiate referral from Dr. Lauree Chandler office.  Instructions sent to patients My Chart messages.

## 2020-04-13 NOTE — Telephone Encounter (Signed)
Reaching out to patient to offer assistance regarding upcoming cardiac imaging study; pt verbalizes understanding of appt date/time, parking situation and where to check in, pre-test NPO status and medications ordered, and verified current allergies; name and call back number provided for further questions should they arise  Gordy Clement RN [place] [facility] [phone] office [phone] cell  Pt reports she is a difficult IV start.

## 2020-04-15 ENCOUNTER — Encounter: Payer: Self-pay | Admitting: *Deleted

## 2020-04-15 ENCOUNTER — Encounter: Payer: No Typology Code available for payment source | Admitting: *Deleted

## 2020-04-15 ENCOUNTER — Other Ambulatory Visit: Payer: Self-pay

## 2020-04-15 ENCOUNTER — Ambulatory Visit (HOSPITAL_COMMUNITY)
Admission: RE | Admit: 2020-04-15 | Discharge: 2020-04-15 | Disposition: A | Discharge status: Home / Self Care | Payer: No Typology Code available for payment source | Source: Ambulatory Visit | Attending: Cardiology | Admitting: Cardiology

## 2020-04-15 DIAGNOSIS — R072 Precordial pain: Secondary | ICD-10-CM | POA: Insufficient documentation

## 2020-04-15 DIAGNOSIS — Z006 Encounter for examination for normal comparison and control in clinical research program: Secondary | ICD-10-CM

## 2020-04-15 MED ORDER — NITROGLYCERIN 0.4 MG SL SUBL
0.8000 mg | SUBLINGUAL_TABLET | Freq: Once | SUBLINGUAL | Status: AC
  Administered 2020-04-15: 0.8 mg via SUBLINGUAL

## 2020-04-15 MED ORDER — IOHEXOL 350 MG/ML SOLN
80.0000 mL | Freq: Once | INTRAVENOUS | Status: AC | PRN
  Administered 2020-04-15: 80 mL via INTRAVENOUS

## 2020-04-15 MED ORDER — NITROGLYCERIN 0.4 MG SL SUBL
SUBLINGUAL_TABLET | SUBLINGUAL | Status: AC
  Filled 2020-04-15: qty 2

## 2020-04-15 NOTE — Research (Signed)
IDENTIFY Informed Consent                  Subject Name:   Victoria [name]   Subject met inclusion and exclusion criteria.  The informed consent form, study requirements and expectations were reviewed with the subject and questions and concerns were addressed prior to the signing of the consent form.  The subject verbalized understanding of the trial requirements.  The subject agreed to participate in the IDENTIFY trial and signed the informed consent.  The informed consent was obtained prior to performance of any protocol-specific procedures for the subject.  A copy of the signed informed consent was given to the subject and a copy was placed in the subject's medical record.   [place] Maleena Eddleman, Research Assistant  02/12/2021 13:33 p.m.

## 2020-04-15 NOTE — Research (Signed)
Research visit.

## 2020-04-20 MED FILL — HUMIRA PEN 40 MG/0.8ML PNKT: 40 | 28 days supply | Qty: 4 | Fill #4

## 2020-04-22 ENCOUNTER — Other Ambulatory Visit: Payer: Self-pay | Admitting: Internal Medicine

## 2020-04-22 MED FILL — PROMETHAZINE 25 MG TABLET: 25 | 10 days supply | Qty: 30 | Fill #1

## 2020-04-22 MED FILL — ALPRAZOLAM 1 MG TABS: 1 | 90 days supply | Qty: 90 | Fill #0

## 2020-04-27 ENCOUNTER — Telehealth: Payer: Self-pay | Admitting: Neurology

## 2020-04-27 ENCOUNTER — Telehealth: Payer: Self-pay | Admitting: Internal Medicine

## 2020-04-27 ENCOUNTER — Other Ambulatory Visit: Payer: Self-pay | Admitting: Neurology

## 2020-04-27 DIAGNOSIS — R202 Paresthesia of skin: Secondary | ICD-10-CM

## 2020-04-27 DIAGNOSIS — M5416 Radiculopathy, lumbar region: Secondary | ICD-10-CM

## 2020-04-27 DIAGNOSIS — R200 Anesthesia of skin: Secondary | ICD-10-CM

## 2020-04-27 NOTE — Telephone Encounter (Signed)
Azka called to let me know she was going to be going to Dr Jaynee Eagles this year and that Dr Jaynee Eagles was going to be referring her to [facility]. She is now going to contact her insurance comapny for her reference number [employer]

## 2020-04-27 NOTE — Telephone Encounter (Signed)
Mind calling patient and scheduling her for emg/ncs please? Thank you!

## 2020-04-28 ENCOUNTER — Inpatient Hospital Stay: Payer: No Typology Code available for payment source | Attending: Family | Admitting: Family

## 2020-04-28 ENCOUNTER — Telehealth: Payer: Self-pay

## 2020-04-28 ENCOUNTER — Inpatient Hospital Stay: Payer: No Typology Code available for payment source

## 2020-04-28 ENCOUNTER — Encounter: Payer: Self-pay | Admitting: Family

## 2020-04-28 ENCOUNTER — Other Ambulatory Visit: Payer: Self-pay

## 2020-04-28 ENCOUNTER — Ambulatory Visit (HOSPITAL_COMMUNITY): Payer: No Typology Code available for payment source | Attending: Cardiology

## 2020-04-28 VITALS — BP 112/65 | HR 66 | Temp 97.9°F | Resp 17 | Ht 67.0 in | Wt 262.0 lb

## 2020-04-28 DIAGNOSIS — R0600 Dyspnea, unspecified: Secondary | ICD-10-CM | POA: Diagnosis not present

## 2020-04-28 DIAGNOSIS — D508 Other iron deficiency anemias: Secondary | ICD-10-CM | POA: Diagnosis not present

## 2020-04-28 DIAGNOSIS — K909 Intestinal malabsorption, unspecified: Secondary | ICD-10-CM

## 2020-04-28 DIAGNOSIS — R002 Palpitations: Secondary | ICD-10-CM

## 2020-04-28 DIAGNOSIS — R0609 Other forms of dyspnea: Secondary | ICD-10-CM

## 2020-04-28 LAB — CBC WITH DIFFERENTIAL (CANCER CENTER ONLY)
Abs Immature Granulocytes: 0 10*3/uL (ref 0.00–0.07)
Basophils Absolute: 0 10*3/uL (ref 0.0–0.1)
Basophils Relative: 1 %
Eosinophils Absolute: 0.2 10*3/uL (ref 0.0–0.5)
Eosinophils Relative: 6 %
HCT: 38.4 % (ref 36.0–46.0)
Hemoglobin: 12.5 g/dL (ref 12.0–15.0)
Immature Granulocytes: 0 %
Lymphocytes Relative: 44 %
Lymphs Abs: 1.4 10*3/uL (ref 0.7–4.0)
MCH: 31 pg (ref 26.0–34.0)
MCHC: 32.6 g/dL (ref 30.0–36.0)
MCV: 95.3 fL (ref 80.0–100.0)
Monocytes Absolute: 0.4 10*3/uL (ref 0.1–1.0)
Monocytes Relative: 12 %
Neutro Abs: 1.2 10*3/uL — ABNORMAL LOW (ref 1.7–7.7)
Neutrophils Relative %: 37 %
Platelet Count: 217 10*3/uL (ref 150–400)
RBC: 4.03 MIL/uL (ref 3.87–5.11)
RDW: 14.3 % (ref 11.5–15.5)
WBC Count: 3.3 10*3/uL — ABNORMAL LOW (ref 4.0–10.5)
nRBC: 0 % (ref 0.0–0.2)

## 2020-04-28 LAB — FERRITIN: Ferritin: 42 ng/mL (ref 11–307)

## 2020-04-28 LAB — RETICULOCYTES
Immature Retic Fract: 11.5 % (ref 2.3–15.9)
RBC.: 4 MIL/uL (ref 3.87–5.11)
Retic Count, Absolute: 47.6 10*3/uL (ref 19.0–186.0)
Retic Ct Pct: 1.2 % (ref 0.4–3.1)

## 2020-04-28 LAB — ECHOCARDIOGRAM COMPLETE
Area-P 1/2: 5.23 cm2
Height: 67 in
S' Lateral: 2.1 cm
Weight: 4192.64 oz

## 2020-04-28 LAB — IRON AND TIBC
Iron: 124 ug/dL (ref 41–142)
Saturation Ratios: 42 % (ref 21–57)
TIBC: 293 ug/dL (ref 236–444)
UIBC: 169 ug/dL (ref 120–384)

## 2020-04-28 NOTE — Telephone Encounter (Signed)
Pt called to make appts per 04/28/20 [place]   [place]

## 2020-04-28 NOTE — Progress Notes (Signed)
Hematology and Oncology Follow Up Visit  Victoria [name] [id number] [birth year] 60 y.o. 04/28/2020   Principle Diagnosis:  Iron deficiency anemia secondary to malabsorption, s/p gastric bypass in 2016  Current Therapy: IV iron as indicated   Interim History:  Victoria [name] is here today for follow-up. She is doing fairly well but notes fatigue, mild SOB with exertion, palpitations and chewing more ice.  She states that she had a right meniscal tear repair and left knee replacement in October 2021. She states that her knees are still a little weak and her balance off. She is working on strengthening but walking and riding her bike.  No falls or syncope to report.  She states that she has had some heaviness in her chest but that so far her cardiac work up including heart cath have been negative. She has an ECHO scheduled for today and followed up with cardiology coming up again soon.  No fever, chills, cough, rash, dizziness, abdominal pain or changes in bladder habits.  She sees urology again on 3/17 for follow-up regarding blood in her urine. She is scheduled for cystoscopy and kidney US that same day.  She has history of GERD and noted nausea off and on. She has not been taking her Protonix regularly but notes that phenergan is helpful.  She had history of constipation but states that recent stools have been a little loose.  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  No swelling noted in her extremities.  She has a good appetite and is doing her best to stay well hydrated. Her weight is stable at  262 lbs.  No blood loss noted. No abnormal bruising, no petechiae.   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 If you have any questions, ask your nurse or doctor.        ALPRAZolam 1 MG tablet Commonly known as: XANAX TAKE 1 TABLET BY MOUTH AT BEDTIME   CALCIUM + D3 PO Take 1 tablet by mouth daily.   celecoxib 200 MG capsule Commonly known as: CeleBREX Take 1 capsule (200 mg total) by mouth 2 (two) times daily between meals as needed.   cyanocobalamin 1000 MCG/ML injection Commonly known as: (VITAMIN B-12) INJECT 1 ML INTRAMUSCULARLY EVERY MONTH What changed:   how much to take  how to take this  when to take this   Divigel 1 MG/GM Gel Generic drug: Estradiol APPLY TO THE SKIN AT BEDTIME What changed: See the new instructions.   Estring 2 MG vaginal ring Generic drug: estradiol Place 2 mg vaginally every 3 (three) months. follow package directions   gabapentin 600 MG tablet Commonly known as: NEURONTIN Take 1 tablet (600 mg total) by mouth 3 (three) times daily.   Humira Pen 40 MG/0.8ML Pnkt Generic drug: Adalimumab Inject one pen under the skin each week.   HYDROcodone-acetaminophen 10-325 MG tablet Commonly known as: NORCO One tab every 8 hours as needed for knee pain   levothyroxine 112 MCG tablet Commonly known as: SYNTHROID TAKE 1 TABLET BY MOUTH ONCE A DAY What changed:  when to take this  additional instructions   metoprolol tartrate 100 MG tablet Commonly known as: LOPRESSOR Take 1 tablet (100 mg total) two hours prior to CT scan.   multivitamin with minerals Tabs tablet Take 1 tablet by mouth daily.   Nurtec 75 MG Tbdp Generic drug: Rimegepant Sulfate TAKE 75 MG BY MOUTH DAILY AS NEEDED FOR MIGRAINES. TAKE AS CLOSE TO ONSET OF MIGRAINE AS POSSIBLE. ONE DAILY MAXIMUM.   pantoprazole 40 MG tablet Commonly known as: PROTONIX TAKE 1 TABLET BY MOUTH TWICE DAILY--- PRN What changed:   how much to take  how to take this  when to take this  reasons to take this  additional  instructions   polyethylene glycol 17 g packet Commonly known as: MIRALAX / GLYCOLAX Take 17 g by mouth daily as needed for mild constipation.   promethazine 25 MG tablet Commonly known as: PHENERGAN TAKE 1 TABLET BY MOUTH EVERY 8 HOURS AS NEEDED FOR NAUSEA AND VOMITING What changed: See the new instructions.   tiZANidine 4 MG tablet Commonly known as: ZANAFLEX TAKE 1 TABLET BY MOUTH EVERY 8 HOURS AS NEEDED FOR MUSCLE SPASMS   traZODone 50 MG tablet Commonly known as: DESYREL Take 1 tablet (50 mg total) by mouth at bedtime. What changed:   when to take this  reasons to take this   valACYclovir 500 MG tablet Commonly known as: VALTREX TAKE 1 TABLET BY MOUTH ONCE DAILY What changed:   how much to take  how to take this  when to take this  reasons to take this  additional instructions       Allergies:  Allergies  Allergen Reactions  . 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 Estradiol Rash and Other (See Comments)    Local rash with estradiol patches    Past Medical History, Surgical history, Social history, and Family History were reviewed and updated.  Review of Systems: All other 10 point review of systems is negative.   Physical Exam:  vitals were not taken for this visit.   Wt Readings from Last 3 Encounters:  04/05/20 260 lb (117.9 kg)  03/09/20 263 lb (119.3 kg)  03/03/20 263 lb (119.3 kg)    Ocular: Sclerae unicteric, pupils equal, round and reactive to light Ear-nose-throat: Oropharynx clear, dentition fair Lymphatic: No cervical or supraclavicular adenopathy Lungs no rales or rhonchi, good excursion bilaterally Heart regular rate and rhythm, no murmur appreciated Abd soft, nontender, positive bowel sounds MSK no focal spinal tenderness, no joint edema Neuro: non-focal, well-oriented, appropriate affect Breasts: Deferred   Lab Results  Component Value Date   WBC 3.3 (L) 04/28/2020    HGB 12.5 04/28/2020   HCT 38.4 04/28/2020   MCV 95.3 04/28/2020   PLT 217 04/28/2020   Lab Results  Component Value Date   FERRITIN 72 01/01/2020   IRON 88 01/01/2020   TIBC 367 01/01/2020   UIBC 177 10/30/2019   IRONPCTSAT 24 01/01/2020   Lab Results  Component Value Date   RETICCTPCT 1.2 04/28/2020   RBC 4.00 04/28/2020   RBC 4.03 04/28/2020   RETICCTABS 36,500 10/02/2018   No results found for: KPAFRELGTCHN, LAMBDASER, KAPLAMBRATIO No results found for: IGGSERUM, IGA, IGMSERUM No results found for: TOTALPROTELP, ALBUMINELP, A1GS, A2GS, BETS, BETA2SER, GAMS, MSPIKE, SPEI   Chemistry      Component Value Date/Time   NA 141 03/03/2020 1242   K 4.1 03/03/2020 1242   CL 104  03/03/2020 1242   CO2 28 03/03/2020 1242   BUN 10 03/03/2020 1242   CREATININE 0.87 03/03/2020 1242      Component Value Date/Time   CALCIUM 9.2 03/03/2020 1242   ALKPHOS 59 10/16/2018 0903   AST 22 03/03/2020 1242   AST 26 10/16/2018 0903   ALT 17 03/03/2020 1242   ALT 26 10/16/2018 0903   BILITOT 0.4 03/03/2020 1242   BILITOT 0.5 10/16/2018 2563       Impression and Plan: Victoria [name] is a very pleasant 60yo African American female with iron deficiency anemia secondary to malabsorptionsincelaparoscopic gastric bypass in 2016. Iron studies are pending. We will replace if needed.  Follow-up in 6 months with lab only in 3 months.  She can contact our office with any questions or concerns.   Laverna Peace, NP 3/3/20228:47 AM

## 2020-04-29 ENCOUNTER — Other Ambulatory Visit: Payer: Self-pay | Admitting: Pharmacist

## 2020-04-29 ENCOUNTER — Telehealth: Payer: Self-pay | Admitting: Orthopaedic Surgery

## 2020-04-29 MED ORDER — HUMIRA PEN 40 MG/0.8ML ~~LOC~~ PNKT: PEN_INJECTOR | SUBCUTANEOUS | 5 refills | Status: DC

## 2020-04-29 NOTE — Telephone Encounter (Signed)
[place] forms received. Sent to [employer] 

## 2020-05-03 NOTE — Telephone Encounter (Signed)
Noted thank you

## 2020-05-03 NOTE — Telephone Encounter (Signed)
Pt. called back & states that she is not able to come in this week as she has another appt. She states that she will keep her appt. on the 21st of this month.

## 2020-05-03 NOTE — Telephone Encounter (Signed)
I called the pt and LVM asking for call back as soon as possible to get her scheduled. Advised we do have an opening this Thurs AM or have others this month if that does not work. Left office number in message.

## 2020-05-04 ENCOUNTER — Encounter: Payer: Self-pay | Admitting: Orthopaedic Surgery

## 2020-05-05 ENCOUNTER — Encounter: Payer: Self-pay | Admitting: Internal Medicine

## 2020-05-05 LAB — HM DIABETES EYE EXAM

## 2020-05-06 ENCOUNTER — Encounter: Payer: Self-pay | Admitting: Internal Medicine

## 2020-05-12 NOTE — Telephone Encounter (Signed)
Marna is being referred to Dr [name] at [facility].

## 2020-05-13 ENCOUNTER — Telehealth: Payer: Self-pay | Admitting: Family

## 2020-05-13 NOTE — Telephone Encounter (Signed)
Appointments rescheduled due to provider pal.  Updated calendar & letter was mailed

## 2020-05-16 ENCOUNTER — Ambulatory Visit (INDEPENDENT_AMBULATORY_CARE_PROVIDER_SITE_OTHER): Payer: No Typology Code available for payment source | Admitting: Neurology

## 2020-05-16 ENCOUNTER — Ambulatory Visit: Payer: No Typology Code available for payment source | Admitting: Neurology

## 2020-05-16 ENCOUNTER — Encounter: Payer: Self-pay | Admitting: Neurology

## 2020-05-16 ENCOUNTER — Other Ambulatory Visit: Payer: Self-pay

## 2020-05-16 DIAGNOSIS — R200 Anesthesia of skin: Secondary | ICD-10-CM

## 2020-05-16 DIAGNOSIS — R202 Paresthesia of skin: Secondary | ICD-10-CM

## 2020-05-16 DIAGNOSIS — R739 Hyperglycemia, unspecified: Secondary | ICD-10-CM

## 2020-05-16 DIAGNOSIS — Z0289 Encounter for other administrative examinations: Secondary | ICD-10-CM

## 2020-05-16 NOTE — Progress Notes (Signed)
See procedure note.

## 2020-05-16 NOTE — Progress Notes (Signed)
Full Name: Victoria [name] Gender: Female MRN #: [id number] Date of Birth: 1960-03-19    Visit Date: 05/16/2020 12:26 Age: 60 Years Examining Physician: Sarina Ill, MD  Primary Care Physician: Cresenciano Lick. Baxley, MD    History: Here for emg/ncs for numbness and tingling in the hands left >> right. Ongoing for over 2 years with chronic neck pain. MRI cervical spine did not find any etiology for symptoms(01/2019).  Summary: Nerve conduction studies performed on the bilateral upper extremities. EMG was performed on the left arm. All nerves and muscles (as indicated in the following tables) were within normal limits.   Conclusion: This is a normal study. No suggestion of mononeuropathy, large-fiber polyneuropathy or cervical radiculopathy. A small fiber neuropathy could evade detection by this exam however sympathetic skin response conduction (for small-fiber neuropathy) was also normal.    ------------------------------- Sarina Ill, M.D.  [facility] [address] [place], [place] [address] Tel: [phone] Fax: [phone]  Verbal informed consent was obtained from the patient, patient was informed of potential risk of procedure, including bruising, bleeding, hematoma formation, infection, muscle weakness, muscle pain, numbness, among others.        Saline    Nerve / Sites Muscle Latency Ref. Amplitude Ref. Rel Amp Segments Distance Velocity Ref. Area    ms ms mV mV %  cm m/s m/s mVms  L Median - APB     Wrist APB 3.8 ?4.4 7.6 ?4.0 100 Wrist - APB 7   30.0     Upper arm APB 7.9  6.6  87.1 Upper arm - Wrist 23 56 ?49 29.5  R Median - APB     Wrist APB 3.4 ?4.4 8.5 ?4.0 100 Wrist - APB 7   32.6     Upper arm APB 7.9  8.8  103 Upper arm - Wrist 23 52 ?49 34.2  L Ulnar - ADM     Wrist ADM 2.8 ?3.3 9.2 ?6.0 100 Wrist - ADM 7   37.4     B.Elbow ADM 6.7  8.9  96.6 B.Elbow - Wrist 19 49 ?49 36.2     A.Elbow ADM 8.7  8.2  91.9 A.Elbow - B.Elbow 10 49 ?49  34.1  R Ulnar - ADM     Wrist ADM 2.9 ?3.3 11.7 ?6.0 100 Wrist - ADM 7   45.7     B.Elbow ADM 6.4  9.7  83.4 B.Elbow - Wrist 19 54 ?49 43.2     A.Elbow ADM 8.4  9.7  99.2 A.Elbow - B.Elbow 10 51 ?49 44.2  L Ulnar - FDI     Wrist FDI 3.6 ?4.5 13.9 ?7.0 100 Wrist - FDI 8   33.5     B.Elbow FDI 8.2  10.3  73.9 B.Elbow - Wrist   ?49 28.1     A.Elbow FDI 9.4  10.0  97.1 A.Elbow - B.Elbow   ?49 33.5     ADM FDI 1.8  10.4  104 ADM - A.Elbow    35.8         A.Elbow - Wrist                   SSR    Nerve / Sites Latency   s  L Sympathetic - Palm     Palm 2.55       SNC    Nerve / Sites Rec. Site Peak Lat Ref.  Amp Ref. Segments Distance Peak Diff Ref.  ms ms V V  cm ms ms  L Median, Ulnar - Transcarpal comparison     Median Palm Wrist 1.9 ?2.2 60 ?35 Median Palm - Wrist 8       Ulnar Palm Wrist 2.1 ?2.2 19 ?12 Ulnar Palm - Wrist 8          Median Palm - Ulnar Palm  -0.2 ?0.4  R Median, Ulnar - Transcarpal comparison     Median Palm Wrist 2.1 ?2.2 87 ?35 Median Palm - Wrist 8       Ulnar Palm Wrist 2.3 ?2.2 25 ?12 Ulnar Palm - Wrist 8          Median Palm - Ulnar Palm  -0.2 ?0.4  L Median - Orthodromic (Dig II, Mid palm)     Dig II Wrist 3.0 ?3.4 14 ?10 Dig II - Wrist 13    R Median - Orthodromic (Dig II, Mid palm)     Dig II Wrist 3.2 ?3.4 11 ?10 Dig II - Wrist 13    L Ulnar - Orthodromic, (Dig V, Mid palm)     Dig V Wrist 3.1 ?3.1 9 ?5 Dig V - Wrist 11    R Ulnar - Orthodromic, (Dig V, Mid palm)     Dig V Wrist 3.1 ?3.1 12 ?5 Dig V - Wrist 40                   F  Wave    Nerve F Lat Ref.   ms ms  L Ulnar - ADM 30.6 ?32.0  R Ulnar - ADM 31.3 ?32.0         EMG Summary Table    Spontaneous MUAP Recruitment  Muscle IA Fib PSW Fasc Other Amp Dur. Poly Pattern  L. Deltoid Normal None None None _______ Normal Normal Normal Normal  L. Triceps brachii Normal None None None _______ Normal Normal Normal Normal  L. Pronator teres Normal None None None _______ Normal Normal Normal  Normal  L. First dorsal interosseous Normal None None None _______ Normal Normal Normal Normal  L Opponens pollicis Normal None None None _______ Normal Normal Normal Normal  L. Cervical paraspinals (low) Normal None None None _______ Normal Normal Normal Normal  L. Flex Dig Prof(Ulnar) Normal None None None _______ Normal Normal Normal Normal  L ADM Normal None None None _______ Normal Normal Normal Normal

## 2020-05-17 NOTE — Progress Notes (Signed)
Left detailed message.   

## 2020-05-17 NOTE — Procedures (Signed)
Full Name: Victoria [name] Gender: Female MRN #: [id number] Date of Birth: 1960-03-19    Visit Date: 05/16/2020 12:26 Age: 60 Years Examining Physician: Sarina Ill, MD  Primary Care Physician: Cresenciano Lick. Baxley, MD    History: Here for emg/ncs for numbness and tingling in the hands left >> right. Ongoing for over 2 years with chronic neck pain. MRI cervical spine did not find any etiology for symptoms(01/2019).  Summary: Nerve conduction studies performed on the bilateral upper extremities. EMG was performed on the left arm. All nerves and muscles (as indicated in the following tables) were within normal limits.   Conclusion: This is a normal study. No suggestion of mononeuropathy, large-fiber polyneuropathy or cervical radiculopathy. A small fiber neuropathy could evade detection by this exam however sympathetic skin response conduction (for small-fiber neuropathy) was also normal.    ------------------------------- Sarina Ill, M.D.  [facility] [address] [place], [place] [address] Tel: [phone] Fax: [phone]  Verbal informed consent was obtained from the patient, patient was informed of potential risk of procedure, including bruising, bleeding, hematoma formation, infection, muscle weakness, muscle pain, numbness, among others.        Saline    Nerve / Sites Muscle Latency Ref. Amplitude Ref. Rel Amp Segments Distance Velocity Ref. Area    ms ms mV mV %  cm m/s m/s mVms  L Median - APB     Wrist APB 3.8 ?4.4 7.6 ?4.0 100 Wrist - APB 7   30.0     Upper arm APB 7.9  6.6  87.1 Upper arm - Wrist 23 56 ?49 29.5  R Median - APB     Wrist APB 3.4 ?4.4 8.5 ?4.0 100 Wrist - APB 7   32.6     Upper arm APB 7.9  8.8  103 Upper arm - Wrist 23 52 ?49 34.2  L Ulnar - ADM     Wrist ADM 2.8 ?3.3 9.2 ?6.0 100 Wrist - ADM 7   37.4     B.Elbow ADM 6.7  8.9  96.6 B.Elbow - Wrist 19 49 ?49 36.2     A.Elbow ADM 8.7  8.2  91.9 A.Elbow - B.Elbow 10 49 ?49  34.1  R Ulnar - ADM     Wrist ADM 2.9 ?3.3 11.7 ?6.0 100 Wrist - ADM 7   45.7     B.Elbow ADM 6.4  9.7  83.4 B.Elbow - Wrist 19 54 ?49 43.2     A.Elbow ADM 8.4  9.7  99.2 A.Elbow - B.Elbow 10 51 ?49 44.2  L Ulnar - FDI     Wrist FDI 3.6 ?4.5 13.9 ?7.0 100 Wrist - FDI 8   33.5     B.Elbow FDI 8.2  10.3  73.9 B.Elbow - Wrist   ?49 28.1     A.Elbow FDI 9.4  10.0  97.1 A.Elbow - B.Elbow   ?49 33.5     ADM FDI 1.8  10.4  104 ADM - A.Elbow    35.8         A.Elbow - Wrist                   SSR    Nerve / Sites Latency   s  L Sympathetic - Palm     Palm 2.55       SNC    Nerve / Sites Rec. Site Peak Lat Ref.  Amp Ref. Segments Distance Peak Diff Ref.  ms ms V V  cm ms ms  L Median, Ulnar - Transcarpal comparison     Median Palm Wrist 1.9 ?2.2 60 ?35 Median Palm - Wrist 8       Ulnar Palm Wrist 2.1 ?2.2 19 ?12 Ulnar Palm - Wrist 8          Median Palm - Ulnar Palm  -0.2 ?0.4  R Median, Ulnar - Transcarpal comparison     Median Palm Wrist 2.1 ?2.2 87 ?35 Median Palm - Wrist 8       Ulnar Palm Wrist 2.3 ?2.2 25 ?12 Ulnar Palm - Wrist 8          Median Palm - Ulnar Palm  -0.2 ?0.4  L Median - Orthodromic (Dig II, Mid palm)     Dig II Wrist 3.0 ?3.4 14 ?10 Dig II - Wrist 13    R Median - Orthodromic (Dig II, Mid palm)     Dig II Wrist 3.2 ?3.4 11 ?10 Dig II - Wrist 13    L Ulnar - Orthodromic, (Dig V, Mid palm)     Dig V Wrist 3.1 ?3.1 9 ?5 Dig V - Wrist 11    R Ulnar - Orthodromic, (Dig V, Mid palm)     Dig V Wrist 3.1 ?3.1 12 ?5 Dig V - Wrist 33                   F  Wave    Nerve F Lat Ref.   ms ms  L Ulnar - ADM 30.6 ?32.0  R Ulnar - ADM 31.3 ?32.0         EMG Summary Table    Spontaneous MUAP Recruitment  Muscle IA Fib PSW Fasc Other Amp Dur. Poly Pattern  R. Deltoid Normal None None None _______ Normal Normal Normal Normal  R. Triceps brachii Normal None None None _______ Normal Normal Normal Normal  R. Pronator teres Normal None None None _______ Normal Normal Normal  Normal  R. First dorsal interosseous Normal None None None _______ Normal Normal Normal Normal  R. Opponens pollicis Normal None None None _______ Normal Normal Normal Normal  R. Cervical paraspinals (low) Normal None None None _______ Normal Normal Normal Normal  R. Flex Dig Prof(Ulnar) Normal None None None _______ Normal Normal Normal Normal  R ADM Normal None None None _______ Normal Normal Normal Normal

## 2020-05-17 NOTE — Progress Notes (Signed)
Nerve conduction studies are normal. No disc or carpal tunnel syndrome.

## 2020-05-18 MED FILL — HUMIRA PEN 40 MG/0.8ML PNKT: 40 | 28 days supply | Qty: 4 | Fill #0

## 2020-05-20 ENCOUNTER — Other Ambulatory Visit (HOSPITAL_COMMUNITY): Payer: Self-pay

## 2020-05-20 ENCOUNTER — Other Ambulatory Visit (HOSPITAL_COMMUNITY): Payer: Self-pay | Admitting: Physician Assistant

## 2020-05-20 MED FILL — DOXYCYCLINE HYCLATE 100 MG: 100 | 14 days supply | Qty: 28 | Fill #0

## 2020-05-23 ENCOUNTER — Telehealth: Payer: Self-pay

## 2020-05-23 ENCOUNTER — Other Ambulatory Visit: Payer: Self-pay | Admitting: Cardiology

## 2020-05-23 MED ORDER — METOPROLOL SUCCINATE ER 25 MG PO TB24: 25.0000 mg | ORAL_TABLET | Freq: Every day | ORAL | 3 refills | Status: DC

## 2020-05-23 MED FILL — METOPROLOL SUCCINATE ER 25: 25 | 90 days supply | Qty: 90 | Fill #0

## 2020-05-23 NOTE — Telephone Encounter (Signed)
The patient has been notified of the result and verbalized understanding.  All questions (if any) were answered. Antonieta Iba, RN 05/23/2020 3:45 PM  Rx has been sent in for Toprol XL 25 mg daily.  She will call back later this week to schedule a follow up appointment once she has her schedule for May.

## 2020-05-23 NOTE — Telephone Encounter (Signed)
-----   Message from Sueanne Margarita, MD sent at 05/20/2020  3:46 PM EDT ----- Heart monitor showed a lot of extra heart beats from the top of the heart called PACs and atrial tachycardia.  There was also some extra heart beats from the bottom of the heart called PVCs and a 5 beat runs of NSVT.  2D echo with normal LVF.  Coronary CTA was normal.  Please start Toprol XL 25mg  daily.  Cut out any caffeine or ETOH.  Recent labs including thyroid were normal.  Followup with Melina Copa, PA in 4 weeks

## 2020-05-24 DIAGNOSIS — R3129 Other microscopic hematuria: Secondary | ICD-10-CM | POA: Diagnosis not present

## 2020-05-24 DIAGNOSIS — N281 Cyst of kidney, acquired: Secondary | ICD-10-CM | POA: Diagnosis not present

## 2020-05-24 DIAGNOSIS — Z9049 Acquired absence of other specified parts of digestive tract: Secondary | ICD-10-CM | POA: Diagnosis not present

## 2020-05-25 ENCOUNTER — Ambulatory Visit (INDEPENDENT_AMBULATORY_CARE_PROVIDER_SITE_OTHER): Payer: No Typology Code available for payment source | Admitting: Orthopaedic Surgery

## 2020-05-25 ENCOUNTER — Other Ambulatory Visit: Payer: Self-pay

## 2020-05-25 ENCOUNTER — Encounter: Payer: Self-pay | Admitting: Orthopaedic Surgery

## 2020-05-25 ENCOUNTER — Ambulatory Visit
Admission: RE | Admit: 2020-05-25 | Discharge: 2020-05-25 | Disposition: A | Discharge status: Home / Self Care | Payer: No Typology Code available for payment source | Source: Ambulatory Visit | Attending: Internal Medicine | Admitting: Internal Medicine

## 2020-05-25 DIAGNOSIS — G8929 Other chronic pain: Secondary | ICD-10-CM | POA: Diagnosis not present

## 2020-05-25 DIAGNOSIS — Z96652 Presence of left artificial knee joint: Secondary | ICD-10-CM

## 2020-05-25 DIAGNOSIS — M25561 Pain in right knee: Secondary | ICD-10-CM

## 2020-05-25 DIAGNOSIS — Z9889 Other specified postprocedural states: Secondary | ICD-10-CM

## 2020-05-25 DIAGNOSIS — Z1231 Encounter for screening mammogram for malignant neoplasm of breast: Secondary | ICD-10-CM

## 2020-05-25 LAB — HEAVY METALS, BLOOD
Arsenic: 2 ug/L (ref 0–9)
Lead, Blood: <1 ug/dL (ref 0–4)
Mercury: 2.4 ug/L (ref 0.0–14.9)

## 2020-05-25 LAB — MULTIPLE MYELOMA PANEL, SERUM
Albumin SerPl Elph-Mcnc: 4 g/dL (ref 2.9–4.4)
Albumin/Glob SerPl: 1.1 (ref 0.7–1.7)
Alpha 1: 0.2 g/dL (ref 0.0–0.4)
Alpha2 Glob SerPl Elph-Mcnc: 0.6 g/dL (ref 0.4–1.0)
B-Globulin SerPl Elph-Mcnc: 1.3 g/dL (ref 0.7–1.3)
Gamma Glob SerPl Elph-Mcnc: 1.5 g/dL (ref 0.4–1.8)
Globulin, Total: 3.7 g/dL (ref 2.2–3.9)
IgA/Immunoglobulin A, Serum: 479 mg/dL — ABNORMAL HIGH (ref 87–352)
IgG (Immunoglobin G), Serum: 1609 mg/dL — ABNORMAL HIGH (ref 586–1602)
IgM (Immunoglobulin M), Srm: 106 mg/dL (ref 26–217)
Total Protein: 7.7 g/dL (ref 6.0–8.5)

## 2020-05-25 LAB — ANTINUCLEAR ANTIBODIES, IFA: ANA Titer 1: NEGATIVE

## 2020-05-25 LAB — SJOGREN'S SYNDROME ANTIBODS(SSA + SSB)
ENA SSA (RO) Ab: <0.2 AI (ref 0.0–0.9)
ENA SSB (LA) Ab: <0.2 AI (ref 0.0–0.9)

## 2020-05-25 LAB — RHEUMATOID FACTOR: Rheumatoid fact SerPl-aCnc: 10.3 IU/mL (ref <14.0)

## 2020-05-25 LAB — HEMOGLOBIN A1C
Est. average glucose Bld gHb Est-mCnc: 117 mg/dL
Hgb A1c MFr Bld: 5.7 % — ABNORMAL HIGH (ref 4.8–5.6)

## 2020-05-25 LAB — VITAMIN B6: Vitamin B6: 3.9 ug/L (ref 2.0–32.8)

## 2020-05-25 LAB — VITAMIN B1: Thiamine: 86.7 nmol/L (ref 66.5–200.0)

## 2020-05-25 MED ORDER — TIZANIDINE HCL 4 MG PO TABS
ORAL_TABLET | ORAL | 1 refills | Status: DC
Start: 2020-05-25 — End: 2020-05-25

## 2020-05-25 MED FILL — tiZANidine HCL 4 MG TABS: 4 | 13 days supply | Qty: 40 | Fill #0

## 2020-05-25 NOTE — Progress Notes (Signed)
The patient is a 60 year old female well-known to me.  We performed surgery on both her knees back in October of last year.  Her left knee with a polyliner upsizing and exchange in her right knee with an arthroscopic intervention.  She has been developing worsening locking catching in the right knee recently.  We have tried conservative treatment including activity modification, outpatient physical therapy, weight loss, job restrictions, oral anti-inflammatories and prescription medications as well as steroid injections and hyaluronic acid injections.  She is having worsening lateral pain with locking catching.  At this point a MRI of that right knee is warranted to assess for a new lateral meniscal tear as well as assess the cartilage given the symptoms of her knee.  Her left knee has been hurting but is more stable.  On exam her left knee moves fluidly and smoothly.  The incisions healed nicely.  Her right knee has significant joint line tenderness and pain throughout the arc of motion in the posterior lateral aspect of the knee.  His ligaments are stable but has a positive McMurray's sign to the lateral aspect of the right knee.  I will keep her on her restricted work duties with no working over 5 hours per shift.  We will obtain an MRI of the right knee to assess the cartilage and to rule out a lateral meniscal tear.  We will see her back after that.

## 2020-05-26 ENCOUNTER — Other Ambulatory Visit (HOSPITAL_COMMUNITY): Payer: Self-pay | Admitting: Physician Assistant

## 2020-05-26 ENCOUNTER — Encounter: Payer: Self-pay | Admitting: Orthopaedic Surgery

## 2020-05-30 DIAGNOSIS — M542 Cervicalgia: Secondary | ICD-10-CM | POA: Insufficient documentation

## 2020-05-30 DIAGNOSIS — R200 Anesthesia of skin: Secondary | ICD-10-CM | POA: Insufficient documentation

## 2020-05-30 DIAGNOSIS — R030 Elevated blood-pressure reading, without diagnosis of hypertension: Secondary | ICD-10-CM | POA: Insufficient documentation

## 2020-05-30 DIAGNOSIS — G8929 Other chronic pain: Secondary | ICD-10-CM | POA: Insufficient documentation

## 2020-06-01 ENCOUNTER — Other Ambulatory Visit: Payer: Self-pay | Admitting: Internal Medicine

## 2020-06-01 ENCOUNTER — Other Ambulatory Visit (HOSPITAL_COMMUNITY): Payer: Self-pay

## 2020-06-01 MED ORDER — CYANOCOBALAMIN 1000 MCG/ML IJ SOLN
INTRAMUSCULAR | 11 refills | Status: DC
  Filled 2020-06-01: qty 3, 84d supply, fill #0
  Filled 2020-08-29: qty 3, 84d supply, fill #1
  Filled 2020-12-13: qty 3, 84d supply, fill #2
  Filled 2021-03-08: qty 3, 84d supply, fill #3
  Filled 2021-06-01: qty 3, 84d supply, fill #4

## 2020-06-01 MED FILL — Rimegepant Sulfate Tab Disint 75 MG: ORAL | 30 days supply | Qty: 8 | Fill #0 | Status: AC

## 2020-06-02 ENCOUNTER — Telehealth: Payer: Self-pay | Admitting: Neurology

## 2020-06-02 ENCOUNTER — Other Ambulatory Visit (HOSPITAL_COMMUNITY): Payer: Self-pay

## 2020-06-02 DIAGNOSIS — M48062 Spinal stenosis, lumbar region with neurogenic claudication: Secondary | ICD-10-CM | POA: Insufficient documentation

## 2020-06-02 DIAGNOSIS — M47816 Spondylosis without myelopathy or radiculopathy, lumbar region: Secondary | ICD-10-CM | POA: Insufficient documentation

## 2020-06-02 DIAGNOSIS — M5126 Other intervertebral disc displacement, lumbar region: Secondary | ICD-10-CM | POA: Insufficient documentation

## 2020-06-02 MED ORDER — SULFAMETHOXAZOLE-TRIMETHOPRIM 800-160 MG PO TABS
ORAL_TABLET | ORAL | 0 refills | Status: DC
  Filled 2020-06-02: qty 20, 10d supply, fill #0

## 2020-06-02 NOTE — Telephone Encounter (Signed)
[facility]) called, Dr. [name] requesting MRI of lumbar spine report done 06/09/19. Pt is here for pain management.   Fax: [phone] Contact info: [phone]

## 2020-06-02 NOTE — Telephone Encounter (Signed)
Report faxed today.To Dr [name] [phone]

## 2020-06-04 ENCOUNTER — Other Ambulatory Visit: Payer: Self-pay

## 2020-06-04 ENCOUNTER — Ambulatory Visit (HOSPITAL_BASED_OUTPATIENT_CLINIC_OR_DEPARTMENT_OTHER)
Admission: RE | Admit: 2020-06-04 | Discharge: 2020-06-04 | Disposition: A | Discharge status: Home / Self Care | Payer: No Typology Code available for payment source | Source: Ambulatory Visit | Attending: Orthopaedic Surgery | Admitting: Orthopaedic Surgery

## 2020-06-04 DIAGNOSIS — M25561 Pain in right knee: Secondary | ICD-10-CM | POA: Diagnosis present

## 2020-06-04 DIAGNOSIS — G8929 Other chronic pain: Secondary | ICD-10-CM | POA: Insufficient documentation

## 2020-06-04 DIAGNOSIS — Z9889 Other specified postprocedural states: Secondary | ICD-10-CM | POA: Insufficient documentation

## 2020-06-06 ENCOUNTER — Other Ambulatory Visit (HOSPITAL_COMMUNITY): Payer: Self-pay

## 2020-06-08 ENCOUNTER — Encounter: Payer: Self-pay | Admitting: Orthopaedic Surgery

## 2020-06-08 ENCOUNTER — Ambulatory Visit (INDEPENDENT_AMBULATORY_CARE_PROVIDER_SITE_OTHER): Payer: No Typology Code available for payment source | Admitting: Orthopaedic Surgery

## 2020-06-08 DIAGNOSIS — M1711 Unilateral primary osteoarthritis, right knee: Secondary | ICD-10-CM | POA: Insufficient documentation

## 2020-06-08 DIAGNOSIS — M25561 Pain in right knee: Secondary | ICD-10-CM

## 2020-06-08 DIAGNOSIS — G8929 Other chronic pain: Secondary | ICD-10-CM

## 2020-06-08 NOTE — Progress Notes (Signed)
The patient comes in today to go over an MRI of her right knee.  She has had an arthroscopic intervention of that knee due to meniscal tear.  She is 60 years old and active.  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  She is someone who is morbidly obese.  Most of her pain is globally around that right knee.  MRIs reviewed and it does show moderate cartilage thinning of the medial compartment of the knee but no areas of full-thickness cartilage loss.  This has progressed slightly since her last MRI.  There is no evidence of any meniscal tearing and the lateral compartment is intact.  The ligaments are also intact.  I continue to recommend weight loss and activity modification.  I would like to see her back in 8 weeks.  At that visit I would like a weight and BMI calculation.  She has had hyaluronic acid in the past.  We can likely order that after her next visit to be placed in her knee in July once this past 6 months from her first hyaluronic acid injection.  She agrees with this treatment plan.

## 2020-06-10 ENCOUNTER — Other Ambulatory Visit (HOSPITAL_COMMUNITY): Payer: Self-pay

## 2020-06-10 MED ORDER — SULFAMETHOXAZOLE-TRIMETHOPRIM 800-160 MG PO TABS
1.0000 | ORAL_TABLET | Freq: Two times a day (BID) | ORAL | 0 refills | Status: DC
  Filled 2020-06-10: qty 60, 30d supply, fill #0

## 2020-06-10 MED ORDER — FLUOCINOLONE ACETONIDE SCALP 0.01 % EX OIL
TOPICAL_OIL | CUTANEOUS | 2 refills | Status: DC
  Filled 2020-06-10: qty 118.28, 30d supply, fill #0

## 2020-06-13 ENCOUNTER — Other Ambulatory Visit: Payer: Self-pay | Admitting: Internal Medicine

## 2020-06-13 ENCOUNTER — Other Ambulatory Visit (HOSPITAL_COMMUNITY): Payer: Self-pay

## 2020-06-13 MED ORDER — LEVOTHYROXINE SODIUM 112 MCG PO TABS
ORAL_TABLET | Freq: Every day | ORAL | 1 refills | Status: DC
  Filled 2020-06-13: qty 90, 90d supply, fill #0
  Filled 2020-09-10: qty 90, 90d supply, fill #1

## 2020-06-13 MED FILL — Adalimumab Auto-injector Kit 40 MG/0.8ML: SUBCUTANEOUS | 28 days supply | Qty: 4 | Fill #0 | Status: AC

## 2020-06-16 ENCOUNTER — Other Ambulatory Visit (HOSPITAL_COMMUNITY): Payer: Self-pay

## 2020-06-21 ENCOUNTER — Encounter: Payer: Self-pay | Admitting: Orthopaedic Surgery

## 2020-06-22 ENCOUNTER — Other Ambulatory Visit (HOSPITAL_COMMUNITY): Payer: Self-pay

## 2020-06-22 ENCOUNTER — Encounter: Payer: Self-pay | Admitting: Obstetrics and Gynecology

## 2020-06-22 MED FILL — Tizanidine HCl Tab 4 MG (Base Equivalent): ORAL | 13 days supply | Qty: 40 | Fill #0 | Status: AC

## 2020-06-22 NOTE — Telephone Encounter (Signed)
Yes please refill for one month

## 2020-06-22 NOTE — Telephone Encounter (Signed)
Patient annual exam scheduled on 07/07/20. Mammogram was in 05/25/20. Okay to refill?

## 2020-06-23 ENCOUNTER — Other Ambulatory Visit (HOSPITAL_COMMUNITY): Payer: Self-pay

## 2020-06-23 MED ORDER — DIVIGEL 1 MG/GM TD GEL
TRANSDERMAL | 0 refills | Status: DC
  Filled 2020-06-23: qty 90, 30d supply, fill #0

## 2020-06-24 ENCOUNTER — Other Ambulatory Visit (HOSPITAL_COMMUNITY): Payer: Self-pay

## 2020-06-30 ENCOUNTER — Other Ambulatory Visit: Payer: No Typology Code available for payment source | Admitting: Internal Medicine

## 2020-06-30 ENCOUNTER — Other Ambulatory Visit: Payer: Self-pay

## 2020-06-30 DIAGNOSIS — E039 Hypothyroidism, unspecified: Secondary | ICD-10-CM

## 2020-06-30 DIAGNOSIS — Z0000 Encounter for general adult medical examination without abnormal findings: Secondary | ICD-10-CM

## 2020-06-30 DIAGNOSIS — G5732 Lesion of lateral popliteal nerve, left lower limb: Secondary | ICD-10-CM

## 2020-06-30 DIAGNOSIS — D708 Other neutropenia: Secondary | ICD-10-CM

## 2020-06-30 DIAGNOSIS — I951 Orthostatic hypotension: Secondary | ICD-10-CM

## 2020-06-30 DIAGNOSIS — M1711 Unilateral primary osteoarthritis, right knee: Secondary | ICD-10-CM

## 2020-06-30 DIAGNOSIS — R739 Hyperglycemia, unspecified: Secondary | ICD-10-CM

## 2020-06-30 DIAGNOSIS — Z9884 Bariatric surgery status: Secondary | ICD-10-CM

## 2020-06-30 DIAGNOSIS — D649 Anemia, unspecified: Secondary | ICD-10-CM

## 2020-07-01 LAB — CBC WITH DIFFERENTIAL/PLATELET
Absolute Monocytes: 321 cells/uL (ref 200–950)
Basophils Absolute: 9 cells/uL (ref 0–200)
Basophils Relative: 0.3 %
Eosinophils Absolute: 90 cells/uL (ref 15–500)
Eosinophils Relative: 3 %
HCT: 36.3 % (ref 35.0–45.0)
Hemoglobin: 11.7 g/dL (ref 11.7–15.5)
Lymphs Abs: 1410 cells/uL (ref 850–3900)
MCH: 31.9 pg (ref 27.0–33.0)
MCHC: 32.2 g/dL (ref 32.0–36.0)
MCV: 98.9 fL (ref 80.0–100.0)
MPV: 12.4 fL (ref 7.5–12.5)
Monocytes Relative: 10.7 %
Neutro Abs: 1170 cells/uL — ABNORMAL LOW (ref 1500–7800)
Neutrophils Relative %: 39 %
Platelets: 170 10*3/uL (ref 140–400)
RBC: 3.67 10*6/uL — ABNORMAL LOW (ref 3.80–5.10)
RDW: 12.5 % (ref 11.0–15.0)
Total Lymphocyte: 47 %
WBC: 3 10*3/uL — ABNORMAL LOW (ref 3.8–10.8)

## 2020-07-01 LAB — COMPLETE METABOLIC PANEL WITH GFR
AG Ratio: 1.3 (calc) (ref 1.0–2.5)
ALT: 18 U/L (ref 6–29)
AST: 18 U/L (ref 10–35)
Albumin: 3.9 g/dL (ref 3.6–5.1)
Alkaline phosphatase (APISO): 51 U/L (ref 37–153)
BUN: 14 mg/dL (ref 7–25)
CO2: 28 mmol/L (ref 20–32)
Calcium: 9 mg/dL (ref 8.6–10.4)
Chloride: 106 mmol/L (ref 98–110)
Creat: 0.71 mg/dL (ref 0.50–1.05)
GFR, Est African American: 108 mL/min/{1.73_m2} (ref >=60)
GFR, Est Non African American: 93 mL/min/{1.73_m2} (ref >=60)
Globulin: 3 g/dL (calc) (ref 1.9–3.7)
Glucose, Bld: 78 mg/dL (ref 65–99)
Potassium: 4.3 mmol/L (ref 3.5–5.3)
Sodium: 140 mmol/L (ref 135–146)
Total Bilirubin: 0.4 mg/dL (ref 0.2–1.2)
Total Protein: 6.9 g/dL (ref 6.1–8.1)

## 2020-07-01 LAB — LIPID PANEL
Cholesterol: 174 mg/dL (ref <200)
HDL: 65 mg/dL (ref >=50)
LDL Cholesterol (Calc): 95 mg/dL (calc)
Non-HDL Cholesterol (Calc): 109 mg/dL (calc) (ref <130)
Total CHOL/HDL Ratio: 2.7 (calc) (ref <5.0)
Triglycerides: 48 mg/dL (ref <150)

## 2020-07-01 LAB — HEMOGLOBIN A1C
Hgb A1c MFr Bld: 5.4 % of total Hgb (ref <5.7)
Mean Plasma Glucose: 108 mg/dL
eAG (mmol/L): 6 mmol/L

## 2020-07-01 LAB — TSH: TSH: 0.6 mIU/L (ref 0.40–4.50)

## 2020-07-04 ENCOUNTER — Ambulatory Visit (INDEPENDENT_AMBULATORY_CARE_PROVIDER_SITE_OTHER): Payer: No Typology Code available for payment source | Admitting: Internal Medicine

## 2020-07-04 ENCOUNTER — Encounter: Payer: Self-pay | Admitting: Internal Medicine

## 2020-07-04 ENCOUNTER — Other Ambulatory Visit: Payer: Self-pay

## 2020-07-04 ENCOUNTER — Other Ambulatory Visit (HOSPITAL_COMMUNITY): Payer: Self-pay

## 2020-07-04 VITALS — BP 110/70 | HR 58 | Ht 65.5 in | Wt 258.0 lb

## 2020-07-04 DIAGNOSIS — F419 Anxiety disorder, unspecified: Secondary | ICD-10-CM

## 2020-07-04 DIAGNOSIS — E538 Deficiency of other specified B group vitamins: Secondary | ICD-10-CM | POA: Diagnosis not present

## 2020-07-04 DIAGNOSIS — Z79899 Other long term (current) drug therapy: Secondary | ICD-10-CM

## 2020-07-04 DIAGNOSIS — Z8669 Personal history of other diseases of the nervous system and sense organs: Secondary | ICD-10-CM

## 2020-07-04 DIAGNOSIS — D708 Other neutropenia: Secondary | ICD-10-CM

## 2020-07-04 DIAGNOSIS — Z8679 Personal history of other diseases of the circulatory system: Secondary | ICD-10-CM

## 2020-07-04 DIAGNOSIS — L732 Hidradenitis suppurativa: Secondary | ICD-10-CM

## 2020-07-04 DIAGNOSIS — Z9884 Bariatric surgery status: Secondary | ICD-10-CM

## 2020-07-04 DIAGNOSIS — M25561 Pain in right knee: Secondary | ICD-10-CM

## 2020-07-04 DIAGNOSIS — M25562 Pain in left knee: Secondary | ICD-10-CM

## 2020-07-04 DIAGNOSIS — E039 Hypothyroidism, unspecified: Secondary | ICD-10-CM

## 2020-07-04 DIAGNOSIS — F5101 Primary insomnia: Secondary | ICD-10-CM

## 2020-07-04 DIAGNOSIS — E890 Postprocedural hypothyroidism: Secondary | ICD-10-CM

## 2020-07-04 DIAGNOSIS — I493 Ventricular premature depolarization: Secondary | ICD-10-CM

## 2020-07-04 DIAGNOSIS — G8929 Other chronic pain: Secondary | ICD-10-CM

## 2020-07-04 DIAGNOSIS — Z96652 Presence of left artificial knee joint: Secondary | ICD-10-CM

## 2020-07-04 DIAGNOSIS — Z0000 Encounter for general adult medical examination without abnormal findings: Secondary | ICD-10-CM | POA: Diagnosis not present

## 2020-07-04 DIAGNOSIS — Z79620 Long term (current) use of immunosuppressive biologic: Secondary | ICD-10-CM

## 2020-07-04 DIAGNOSIS — Z87898 Personal history of other specified conditions: Secondary | ICD-10-CM

## 2020-07-04 DIAGNOSIS — Z96642 Presence of left artificial hip joint: Secondary | ICD-10-CM

## 2020-07-04 DIAGNOSIS — Z96641 Presence of right artificial hip joint: Secondary | ICD-10-CM

## 2020-07-04 LAB — POCT URINALYSIS DIPSTICK
Bilirubin, UA: NEGATIVE
Glucose, UA: NEGATIVE
Ketones, UA: NEGATIVE
Nitrite, UA: NEGATIVE
Protein, UA: NEGATIVE
Spec Grav, UA: 1.015 (ref 1.010–1.025)
Urobilinogen, UA: 0.2 E.U./dL
pH, UA: 6 (ref 5.0–8.0)

## 2020-07-04 MED ORDER — TIZANIDINE HCL 4 MG PO CAPS
4.0000 mg | ORAL_CAPSULE | Freq: Three times a day (TID) | ORAL | 1 refills | Status: DC
Start: 2020-07-04 — End: 2020-09-10
  Filled 2020-07-04: qty 30, 10d supply, fill #0
  Filled 2020-07-30: qty 30, 10d supply, fill #1

## 2020-07-04 MED ORDER — TRIAMCINOLONE ACETONIDE 0.1 % EX CREA
1.0000 "application " | TOPICAL_CREAM | Freq: Four times a day (QID) | CUTANEOUS | 1 refills | Status: DC
  Filled 2020-07-04: qty 60, 15d supply, fill #0

## 2020-07-04 MED ORDER — GABAPENTIN 600 MG PO TABS
ORAL_TABLET | Freq: Three times a day (TID) | ORAL | 0 refills | Status: DC
  Filled 2020-07-04: qty 90, 30d supply, fill #0

## 2020-07-05 ENCOUNTER — Ambulatory Visit: Payer: No Typology Code available for payment source | Admitting: Cardiology

## 2020-07-05 ENCOUNTER — Other Ambulatory Visit (HOSPITAL_COMMUNITY): Payer: Self-pay

## 2020-07-05 ENCOUNTER — Encounter: Payer: Self-pay | Admitting: Cardiology

## 2020-07-05 VITALS — BP 114/80 | HR 51 | Ht 65.5 in | Wt 266.0 lb

## 2020-07-05 DIAGNOSIS — R0609 Other forms of dyspnea: Secondary | ICD-10-CM

## 2020-07-05 DIAGNOSIS — R002 Palpitations: Secondary | ICD-10-CM | POA: Diagnosis not present

## 2020-07-05 DIAGNOSIS — Z79899 Other long term (current) drug therapy: Secondary | ICD-10-CM | POA: Diagnosis not present

## 2020-07-05 DIAGNOSIS — R079 Chest pain, unspecified: Secondary | ICD-10-CM | POA: Diagnosis not present

## 2020-07-05 DIAGNOSIS — R0600 Dyspnea, unspecified: Secondary | ICD-10-CM

## 2020-07-05 MED ORDER — FLECAINIDE ACETATE 50 MG PO TABS
50.0000 mg | ORAL_TABLET | Freq: Two times a day (BID) | ORAL | 3 refills | Status: DC
  Filled 2020-07-05: qty 180, 90d supply, fill #0

## 2020-07-05 NOTE — Progress Notes (Signed)
60 y.o. G2P0112 Married Black or [place] American Not Hispanic or Latino female here for annual exam. Rash on leg. Has her e-string in.   H/o hysterectomy on divigel. Vasomotor symptoms are well controlled with the estrogen, miserable if she spaces it out. Not wanting to decrease her dose at the moment.   Not sexually active, not secondary to vaginal atrophy. Husband had prostate cancer.   H/O DIV, treated at the end of last year with steroids. Symptoms resumed after she stopped the suppositories. She continues to have vaginal d/c, no odor. She has some itching on the inside of her vagina.  She started the estring over a month ago, doesn't feel it's doing anything.   She did see the Urologist about hematuria, work up was negative. No f/u needed.   Since her last visit she has been started on medication for benign palpitations. Negative cardiac CT.   She has had a gastric bypass. She is starting the healthy weight and wellness program.   Rare HSV outbreaks.     No LMP recorded. Patient has had a hysterectomy.          Sexually active: No.  The current method of family planning is status post hysterectomy.    Exercising: Yes.    walking  Smoker:  no  Health Maintenance: Pap:  08/14/2018 Neg12/19/13 WNL History of abnormal Pap:  no MMG:  05/26/20 density  Bi-rads 1 neg  BMD:   03/24/18 normal  Colonoscopy:03/29/17 normal  TDaP:  2013 Gardasil: NA   reports that she has never smoked. She has never used smokeless tobacco. She reports that she does not drink alcohol and does not use drugs. 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 Twins daughters will be 34 this month, one local, one in [place]. 4 granddaughters. 18,11,10 and 50. 62 and 41 year old are local.   Past Medical History:  Diagnosis Date  . Back pain   . Bursitis    right shoulder  . Chronic leukopenia    intermittant since 2010, followed by Dr. Renold Genta now  . Constipation   . Degenerative joint disease of low back  09/02/15    Dr. Maureen Ralphs  . Edema of both lower legs   . Fibrocystic breast   . Gallbladder problem   . GERD (gastroesophageal reflux disease)   . Headache    per pt more stress/tension  . Heart palpitations    SEE EPIC ENCOUTNER , CARDIOLOGY DR. Tressia Miners TURNER 2018; reports on 05-16-17 " i haven't had any bouts of those lately"    . Hemorrhoids   . History of Clostridium difficile infection 12/2010  . History of exercise intolerance    ETT on 04-27-2016-- negative ([facility] treadmill score 7)  . History of Helicobacter pylori infection 2001 and 09/ 2012  . HSV-2 infection    genital  . Hx of adenomatous colonic polyps 10/17/2007  . Hydradenitis    per pt currently treated with Humira injection  . Hypothyroidism, postsurgical 1980  . IBS (irritable bowel syndrome)   . Joint pain   . Medial meniscus tear    right knee  . Osteoarthritis   . Palpitations   . Pernicious anemia    b12 def  . PONV (postoperative nausea and vomiting)   . 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 Prediabetes   . 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 SOB (shortness of breath)   . Tendonitis    right shoulder  .  Varicose veins   . Vitamin B 12 deficiency   . Vitamin D deficiency     Past Surgical History:  Procedure Laterality Date  . BREATH TEK H PYLORI N/A 05/03/2014   Procedure: BREATH TEK H PYLORI;  Surgeon: Kaylyn Lim, MD;  Location: [facility];  Service: General;  Laterality: N/A;  . CARDIOVASCULAR STRESS TEST  03/11/2013   dr Tressia Miners turner   Low risk nuclear study w/ a very small anterior perfusion defect that most likely represents breast attenuation artifact, less likely this could represent a true perfusion abnormality in the distribution of diagonal branch of the LAD/  normal LV function and wall motion , ef 66%  . CATARACT EXTRACTION W/ INTRAOCULAR LENS  IMPLANT, BILATERAL  10/2017  . CESAREAN SECTION  1985  . CHOLECYSTECTOMY OPEN  1987  .  COLONOSCOPY  02/2017   Dr [name] ; per patient they found 7 polyps with polypectomy; on 5 year track   . FINE NEEDLE ASPIRATION Left 05/22/2017   Procedure: LEFT KNEE ASPIRATION;  Surgeon: Gaynelle Arabian, MD;  Location: [facility];  Service: Orthopedics;  Laterality: Left;  Marland Kitchen GASTRIC ROUX-EN-Y N/A 07/06/2014   Procedure: LAPAROSCOPIC ROUX-EN-Y GASTRIC BYPASS, ENTEROLYSIS OF ADHESIONS WITH UPPER ENDOSCOPY;  Surgeon: Johnathan Hausen, MD;  Location: [facility];  Service: General;  Laterality: N/A;  . HAMMER TOE SURGERY Bilateral 02/17/2008   bilateral foot digit's 2 and 5  . HIATAL HERNIA REPAIR N/A 07/06/2014   Procedure: LAPAROSCOPIC REPAIR OF HIATAL HERNIA;  Surgeon: Johnathan Hausen, MD;  Location: [facility];  Service: General;  Laterality: N/A;  . I & D KNEE WITH POLY EXCHANGE Left 12/11/2019   Procedure: LEFT KNEE POLY-LINER EXCHANGE;  Surgeon: Mcarthur Rossetti, MD;  Location: [place];  Service: Orthopedics;  Laterality: Left;  . KNEE ARTHROSCOPY Right 12/11/2019   Procedure: RIGHT KNEE ARTHROSCOPY WITH PARTIAL MEDIAL MENISCECTOMY;  Surgeon: Mcarthur Rossetti, MD;  Location: [place];  Service: Orthopedics;  Laterality: Right;  . KNEE ARTHROSCOPY WITH MEDIAL MENISECTOMY Left 07/17/2017   Procedure: LEFT KNEE ARTHROSCOPY WITH MEDIAL LATERAL MENISECTOMY;  Surgeon: Gaynelle Arabian, MD;  Location: [facility];  Service: Orthopedics;  Laterality: Left;  Marland Kitchen MASS EXCISION N/A 10/11/2016   Procedure: EXCISION OF PERIANAL MASS;  Surgeon: [name], MD;  Location: [place];  Service: General;  Laterality: N/A;  . REFRACTIVE SURGERY    . THYROIDECTOMY  1980  . TOTAL ABDOMINAL HYSTERECTOMY  08-25-2002   dr Margaretha Glassing   ovaries retained  . TOTAL HIP ARTHROPLASTY Right 05/21/2012   Procedure: TOTAL HIP ARTHROPLASTY;  Surgeon: Gearlean Alf, MD;  Location: [facility];  Service: Orthopedics;  Laterality: Right;  . TOTAL HIP ARTHROPLASTY Left 01-31-2009  dr Wynelle Link  . TOTAL HIP REVISION Left 05/22/2017    Procedure: Left hip bearing surface and head revision;  Surgeon: Gaynelle Arabian, MD;  Location: [facility];  Service: Orthopedics;  Laterality: Left;  . TOTAL KNEE ARTHROPLASTY Left 01/20/2018   Procedure: LEFT TOTAL KNEE ARTHROPLASTY;  Surgeon: Gaynelle Arabian, MD;  Location: [facility];  Service: Orthopedics;  Laterality: Left;  11min  . TRANSTHORACIC ECHOCARDIOGRAM  05/03/2016   ef 55-60%/  trivial MR/  mild TR  . TUBAL LIGATION      Current Outpatient Medications  Medication Sig Dispense Refill  . Adalimumab 40 MG/0.8ML PNKT INJECT ONE 40MG /0.8ML PEN UNDER THE SKIN EACH WEEK. 4 each 5  . ALPRAZolam (XANAX) 1 MG tablet TAKE 1 TABLET BY MOUTH AT BEDTIME 90 tablet 0  .  Calcium Carb-Cholecalciferol (CALCIUM + D3 PO) Take 1 tablet by mouth daily.    . celecoxib (CELEBREX) 200 MG capsule TAKE 1 CAPSULE BY MOUTH 2 TIMES DAILY BETWEEN MEALS AS NEEDED. 60 capsule 1  . cyanocobalamin (,VITAMIN B-12,) 1000 MCG/ML injection INJECT 1 ML INTRAMUSCULARLY EVERY MONTH 3 mL 11  . Estradiol (DIVIGEL) 1 MG/GM GEL APPLY TO THE SKIN AT BEDTIME 90 g 0  . estradiol (ESTRING) 2 MG vaginal ring Place 2 mg vaginally every 3 (three) months. follow package directions    . flecainide (TAMBOCOR) 50 MG tablet Take 1 tablet (50 mg total) by mouth 2 (two) times daily. 180 tablet 3  . Fluocinolone Acetonide Scalp 0.01 % OIL Apply to scalp twice daily for 2 weeks, stop for 1 week then repeat 118.28 mL 2  . gabapentin (NEURONTIN) 600 MG tablet TAKE 1 TABLET BY MOUTH 3 TIMES DAILY 90 tablet 0  . HYDROcodone-acetaminophen (NORCO) 10-325 MG tablet TAKE 1 TABLET BY MOUTH EVERY 8 HOURS AS NEEDED FOR KNEE PAIN 15 tablet 0  . levothyroxine (SYNTHROID) 112 MCG tablet TAKE 1 TABLET BY MOUTH ONCE A DAY 90 tablet 1  . metoprolol succinate (TOPROL-XL) 25 MG 24 hr tablet TAKE 1 TABLET (25 MG TOTAL) BY MOUTH DAILY. 90 tablet 3  . Multiple Vitamin (MULTIVITAMIN WITH MINERALS) TABS tablet Take 1 tablet by mouth daily.    . NURTEC 75 MG TBDP DISSOLVE  1 TABLET BY MOUTH DAILY AS NEEDED FOR MIGRAINES. TAKE AS CLOSE TO ONSET OF MIGRAINE AS POSSIBLE. ONE DAILY MAXIMUM. 8 tablet 11  . pantoprazole (PROTONIX) 40 MG tablet TAKE 1 TABLET BY MOUTH TWICE DAILY--- PRN (Patient taking differently: Take 40 mg by mouth 2 (two) times daily as needed (indigestion/acid reflux.).)    . polyethylene glycol (MIRALAX / GLYCOLAX) packet Take 17 g by mouth daily as needed for mild constipation.    . promethazine (PHENERGAN) 25 MG tablet TAKE 1 TABLET BY MOUTH EVERY 8 HOURS AS NEEDED FOR NAUSEA AND/OR VOMITING (Patient taking differently: Take 25 mg by mouth every 8 (eight) hours as needed for nausea or vomiting.) 30 tablet 5  . sulfamethoxazole-trimethoprim (BACTRIM DS) 800-160 MG tablet Take 1 Tablet by mouth twice daily for 1 month 60 tablet 0  . tiZANidine (ZANAFLEX) 4 MG capsule Take 1 capsule (4 mg total) by mouth 3 (three) times daily. 30 capsule 1  . traZODone (DESYREL) 50 MG tablet TAKE 1 TABLET BY MOUTH AT BEDTIME (Patient taking differently: Take 50 mg by mouth at bedtime as needed for sleep.) 30 tablet 1  . triamcinolone cream (KENALOG) 0.1 % Apply topically 4 (four) times daily as directed. 60 g 1  . valACYclovir (VALTREX) 500 MG tablet TAKE 1 TABLET BY MOUTH ONCE DAILY (Patient taking differently: Take 500 mg by mouth daily as needed (outbreaks).) 90 tablet 3   No current facility-administered medications for this visit.   Facility-Administered Medications Ordered in Other Visits  Medication Dose Route Frequency Provider Last Rate Last Admin  . gadobenate dimeglumine (MULTIHANCE) injection 20 mL  20 mL Intravenous Once PRN Melvenia Beam, MD        Family History  Problem Relation Age of Onset  . Diabetes Father   . High blood pressure Father   . High Cholesterol Father   . Heart disease Father   . Obesity Father   . Diabetes Sister   . Hypertension Sister   . Hypertension Mother   . Headache Mother   . High Cholesterol Mother   .  Thyroid  disease Mother   . Obesity Mother     Review of Systems  Genitourinary: Positive for vaginal discharge.  All other systems reviewed and are negative.   Exam:   BP 122/64   Pulse (!) 53   Ht 5\' 6"  (1.676 m)   Wt 265 lb (120.2 kg)   SpO2 99%   BMI 42.77 kg/m   Weight change: @WEIGHTCHANGE @ Height:   Height: 5\' 6"  (167.6 cm)  Ht Readings from Last 3 Encounters:  07/07/20 5\' 6"  (1.676 m)  07/07/20 5\' 6"  (1.676 m)  07/05/20 5' 5.5" (1.664 m)    General appearance: alert, cooperative and appears stated age Head: Normocephalic, without obvious abnormality, atraumatic Neck: no adenopathy, supple, symmetrical, trachea midline and thyroid normal to inspection and palpation Lungs: clear to auscultation bilaterally Cardiovascular: regular rate and rhythm Breasts: normal appearance, no masses or tenderness Abdomen: soft, non-tender; non distended,  no masses,  no organomegaly Extremities: extremities normal, atraumatic, no cyanosis or edema Skin: Skin color, texture, turgor normal. No rashes or lesions Lymph nodes: Cervical, supraclavicular, and axillary nodes normal. No abnormal inguinal nodes palpated Neurologic: Grossly normal   Pelvic: External genitalia:  no lesions              Urethra:  normal appearing urethra with no masses, tenderness or lesions              Bartholins and Skenes: normal                 Vagina: erythematous appearing vagina with copious amounts of white watery/creamy vaginal discharge, PH 5.               Cervix: absent               Bimanual Exam:  Uterus:  uterus absent              Adnexa: no mass, fullness, tenderness               Rectovaginal: Confirms               Anus:  normal sphincter tone, no lesions  Gae Dry chaperoned for the exam.   1. Well woman exam Discussed breast self exam Discussed calcium and vit D intake Mammogram, colonoscopy and DEXA are UTD No pap needed  2. Vaginal discharge Copious amounts, suspect DIV on exam.   - WET PREP FOR TRICH, YEAST, CLUE: + clue, but also +++WBC's.  Negative testing for GC/CT in 11/21, not sexually active. -Will send vaginitis panel -If symptoms don't improve will treat for DIV   3. History of hysterectomy   4. Encounter for monitoring postmenopausal estrogen replacement therapy Discussed risks, she wants to continue - Estradiol (DIVIGEL) 1 MG/GM GEL; APPLY TO THE SKIN AT BEDTIME  Dispense: 90 g; Refill: 3  5. History of herpes genitalis Rare outbreaks - valACYclovir (VALTREX) 500 MG tablet; TAKE 1 TABLET BY MOUTH BID for 3 days as needed.  Dispense: 30 tablet; Refill: 1

## 2020-07-05 NOTE — Addendum Note (Signed)
Addended by: Antonieta Iba on: 07/05/2020 08:36 AM   Modules accepted: Orders

## 2020-07-05 NOTE — Progress Notes (Addendum)
Date:  07/05/2020   ID:  Victoria [name], DOB [birth year], MRN [id number] The patient was identified using 2 identifiers. PCP:  Elby Showers, MD  Cardiologist:  Fransico Him, MD  Electrophysiologist:  None   Evaluation Performed: Office Followup  Chief Complaint:  Palpitations/chest pain  History of Present Illness:    Victoria [name] is a 60 y.o. female with with a history of GERD, hiatal hernia and chest pain.  She was initially see by me several years ago for chest pain and nuclear stress test showed low risk study with a very small anterior perfusion defect that most likely represents breast attenuation artifact. Less likely this could represent a true perfusion abnormality in the distribution of a diagonal artery branch of the LAD. Her EF was normal. She was started on Protonix for possible GERD symptoms and CP resolved.  She last saw me in 2018 at which time she was having SOB.  She had a gastric bypass in 2016 and had not felt well since then.  Her DOE was with talking and walking and also was having some epigastric pain at night.  2D echo was normal which was normal. She had an ETT done which showed no ischemia.    She recently was seen by her GYN MD and was noted to have a low HR in the 40's.  She has been monitoring her heart beat recently and has noted that she has been having some fluttering in her chest and this is followed by the need to cough.  Her PCP did some orthostatic BPs recently due to dizziness and her BP dropped from 120/80 to 100/63mHg.   She also has had some chest tightness and some pain going down her neck on the left side but they are not associated with each other.  She has been having some DOE when moving around doing housework and is new for her but does not get chest pain with it.   We did a 2D echo which was normal and coronary CTA showed no CAD and Ca score of 0.  She wore an event monitor which showed PVCs, PACs, nonsustained atrial  tachycardia, NSVT and she was placed on Toprol XL 229mdaily.  She is now here for followup.  She is here today for followup and is doing well.  She denies any chest pain or pressure, SOB, DOE, PND, orthopnea, LE edema, dizziness, palpitations or syncope. She is compliant with her meds and is tolerating meds with no SE.    Past Medical History:  Diagnosis Date  . Bursitis    right shoulder  . Chronic leukopenia    intermittant since 2010, followed by Dr. BaRenold Gentaow  . Degenerative joint disease of low back 09/02/15    Dr. AlMaureen Ralphs. Fibrocystic breast   . GERD (gastroesophageal reflux disease)   . Headache    per pt more stress/tension  . Heart palpitations    SEE EPIC E[facility]NER , CARDIOLOGY DR. TRTressia MinersURNER 2018; reports on 05-16-17 " i haven't had any bouts of those lately"    . Hemorrhoids   . History of Clostridium difficile infection 12/2010  . History of exercise intolerance    ETT on 04-27-2016-- negative ([facility] treadmill score 7)  . History of Helicobacter pylori infection 2001 and 09/ 2012  . HSV-2 infection    genital  . Hx of adenomatous colonic polyps 10/17/2007  . Hydradenitis    per pt currently treated with Humira injection  .  Hypothyroidism, postsurgical 1980  . Medial meniscus tear    right knee  . Osteoarthritis   . Pernicious anemia    b12 def  . PONV (postoperative nausea and vomiting)   . 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 Tendonitis    right shoulder  . Varicose veins   . Vitamin B 12 deficiency   . Vitamin D deficiency    Past Surgical History:  Procedure Laterality Date  . BREATH TEK H PYLORI N/A 05/03/2014   Procedure: BREATH TEK H PYLORI;  Surgeon: Kaylyn Lim, MD;  Location: [facility];  Service: General;  Laterality: N/A;  . CARDIOVASCULAR STRESS TEST  03/11/2013   dr Tressia Miners [facility]   Low risk nuclear study w/ a very small anterior perfusion defect that most  likely represents breast attenuation artifact, less likely this could represent a true perfusion abnormality in the distribution of diagonal branch of the LAD/  normal LV function and wall motion , ef 66%  . CATARACT EXTRACTION W/ INTRAOCULAR LENS  IMPLANT, BILATERAL  10/2017  . CESAREAN SECTION  1985  . CHOLECYSTECTOMY OPEN  1987  . COLONOSCOPY  02/2017   Dr [name] ; per patient they found 7 polyps with polypectomy; on 5 year track   . FINE NEEDLE ASPIRATION Left 05/22/2017   Procedure: LEFT KNEE ASPIRATION;  Surgeon: Gaynelle Arabian, MD;  Location: [facility];  Service: Orthopedics;  Laterality: Left;  Marland Kitchen GASTRIC ROUX-EN-Y N/A 07/06/2014   Procedure: LAPAROSCOPIC ROUX-EN-Y GASTRIC BYPASS, ENTEROLYSIS OF ADHESIONS WITH UPPER ENDOSCOPY;  Surgeon: Johnathan Hausen, MD;  Location: [facility];  Service: General;  Laterality: N/A;  . HAMMER TOE SURGERY Bilateral 02/17/2008   bilateral foot digit's 2 and 5  . HIATAL HERNIA REPAIR N/A 07/06/2014   Procedure: LAPAROSCOPIC REPAIR OF HIATAL HERNIA;  Surgeon: Johnathan Hausen, MD;  Location: [facility];  Service: General;  Laterality: N/A;  . I & D KNEE WITH POLY EXCHANGE Left 12/11/2019   Procedure: LEFT KNEE POLY-LINER EXCHANGE;  Surgeon: Mcarthur Rossetti, MD;  Location: [place];  Service: Orthopedics;  Laterality: Left;  . KNEE ARTHROSCOPY Right 12/11/2019   Procedure: RIGHT KNEE ARTHROSCOPY WITH PARTIAL MEDIAL MENISCECTOMY;  Surgeon: Mcarthur Rossetti, MD;  Location: [place];  Service: Orthopedics;  Laterality: Right;  . KNEE ARTHROSCOPY WITH MEDIAL MENISECTOMY Left 07/17/2017   Procedure: LEFT KNEE ARTHROSCOPY WITH MEDIAL LATERAL MENISECTOMY;  Surgeon: Gaynelle Arabian, MD;  Location: [facility];  Service: Orthopedics;  Laterality: Left;  Marland Kitchen MASS EXCISION N/A 10/11/2016   Procedure: EXCISION OF PERIANAL MASS;  Surgeon: [name], MD;  Location: [place];  Service: General;  Laterality: N/A;  . REFRACTIVE SURGERY    . THYROIDECTOMY  1980  . TOTAL  ABDOMINAL HYSTERECTOMY  08-25-2002   dr Margaretha Glassing   ovaries retained  . TOTAL HIP ARTHROPLASTY Right 05/21/2012   Procedure: TOTAL HIP ARTHROPLASTY;  Surgeon: Gearlean Alf, MD;  Location: [facility];  Service: Orthopedics;  Laterality: Right;  . TOTAL HIP ARTHROPLASTY Left 01-31-2009  dr Wynelle Link  . TOTAL HIP REVISION Left 05/22/2017   Procedure: Left hip bearing surface and head revision;  Surgeon: Gaynelle Arabian, MD;  Location: [facility];  Service: Orthopedics;  Laterality: Left;  . TOTAL KNEE ARTHROPLASTY Left 01/20/2018   Procedure: LEFT TOTAL KNEE ARTHROPLASTY;  Surgeon: Gaynelle Arabian, MD;  Location: [facility];  Service: Orthopedics;  Laterality: Left;  35mn  . TRANSTHORACIC ECHOCARDIOGRAM  05/03/2016   ef 55-60%/  trivial MR/  mild TR  . TUBAL LIGATION       Current Meds  Medication Sig  . Adalimumab 40 MG/0.8ML PNKT INJECT ONE 40MG/0.8ML PEN UNDER THE SKIN EACH WEEK.  Marland Kitchen ALPRAZolam (XANAX) 1 MG tablet TAKE 1 TABLET BY MOUTH AT BEDTIME  . Calcium Carb-Cholecalciferol (CALCIUM + D3 PO) Take 1 tablet by mouth daily.  . celecoxib (CELEBREX) 200 MG capsule TAKE 1 CAPSULE BY MOUTH 2 TIMES DAILY BETWEEN MEALS AS NEEDED.  . cyanocobalamin (,VITAMIN B-12,) 1000 MCG/ML injection INJECT 1 ML INTRAMUSCULARLY EVERY MONTH  . doxycycline (VIBRA-TABS) 100 MG tablet TAKE 1 TABLET BY MOUTH 2 TIMES DAILY  . doxycycline (VIBRA-TABS) 100 MG tablet TAKE 1 TABLET BY MOUTH 2 TIMES DAILY FOR 14 DAYS  . Estradiol (DIVIGEL) 1 MG/GM GEL APPLY TO THE SKIN AT BEDTIME  . fluconazole (DIFLUCAN) 150 MG tablet TAKE 1 TABLET BY MOUTH ONCE FOR 1 DOSE, REPEAT IN 72 HOURS IF SYMPTOMS ARE NOT COMPLETELY RESOLVED  . Fluocinolone Acetonide Scalp 0.01 % OIL Apply to scalp twice daily for 2 weeks, stop for 1 week then repeat  . gabapentin (NEURONTIN) 600 MG tablet TAKE 1 TABLET BY MOUTH 3 TIMES DAILY  . HYDROcodone-acetaminophen (NORCO) 10-325 MG tablet TAKE 1 TABLET BY MOUTH EVERY 8 HOURS AS NEEDED FOR KNEE PAIN  . levothyroxine  (SYNTHROID) 112 MCG tablet TAKE 1 TABLET BY MOUTH ONCE A DAY  . metoprolol succinate (TOPROL-XL) 25 MG 24 hr tablet TAKE 1 TABLET (25 MG TOTAL) BY MOUTH DAILY.  . Multiple Vitamin (MULTIVITAMIN WITH MINERALS) TABS tablet Take 1 tablet by mouth daily.  . NURTEC 75 MG TBDP DISSOLVE 1 TABLET BY MOUTH DAILY AS NEEDED FOR MIGRAINES. TAKE AS CLOSE TO ONSET OF MIGRAINE AS POSSIBLE. ONE DAILY MAXIMUM.  Marland Kitchen pantoprazole (PROTONIX) 40 MG tablet TAKE 1 TABLET BY MOUTH TWICE DAILY--- PRN (Patient taking differently: Take 40 mg by mouth 2 (two) times daily as needed (indigestion/acid reflux.).)  . polyethylene glycol (MIRALAX / GLYCOLAX) packet Take 17 g by mouth daily as needed for mild constipation.  . promethazine (PHENERGAN) 25 MG tablet TAKE 1 TABLET BY MOUTH EVERY 8 HOURS AS NEEDED FOR NAUSEA AND/OR VOMITING (Patient taking differently: Take 25 mg by mouth every 8 (eight) hours as needed for nausea or vomiting.)  . sulfamethoxazole-trimethoprim (BACTRIM DS) 800-160 MG tablet Take 1 Tablet by mouth twice daily for 1 month  . tiZANidine (ZANAFLEX) 4 MG capsule Take 1 capsule (4 mg total) by mouth 3 (three) times daily.  . traZODone (DESYREL) 50 MG tablet TAKE 1 TABLET BY MOUTH AT BEDTIME (Patient taking differently: Take 50 mg by mouth at bedtime as needed for sleep.)  . triamcinolone cream (KENALOG) 0.1 % Apply topically 4 (four) times daily as directed.  . valACYclovir (VALTREX) 500 MG tablet TAKE 1 TABLET BY MOUTH ONCE DAILY (Patient taking differently: Take 500 mg by mouth daily as needed (outbreaks).)     Allergies:   Other and Estradiol   Social History   Tobacco Use  . Smoking status: Never Smoker  . Smokeless tobacco: Never Used  Vaping Use  . Vaping Use: Never used  Substance Use Topics  . Alcohol use: No  . Drug use: No     Family Hx: The patient's family history includes Diabetes in her father and sister; Headache in her mother; Hypertension in her mother and sister.  ROS:   Please  see the history of present illness.  All other systems reviewed and are negative.   Prior CV studies:   The following studies were reviewed today:  Event monitor 04/2020 Study Highlights    Predominant rhythm was normal sinus rhythm with average heart rate 65bpm. The heart rate ranged from 42 to 127bpm.  Frequent PACs, atrial couplets, atrial bigeminy  Nonsustained atrial tachycardia up to 24 beats in a row with fastest rate up to 200bpm.  Occasional PVCs and venctiular couplets.  Wide complex tachycardia up to 5 beats in a row.    2D echo 2022 IMPRESSIONS  1. Left ventricular ejection fraction, by estimation, is 65 to 70%. Left  ventricular ejection fraction by 3D volume is 66 %. The left ventricle has  normal function. The left ventricle has no regional wall motion  abnormalities. Left ventricular diastolic  parameters were normal. The average left ventricular global longitudinal  strain is -26.9 %. The global longitudinal strain is normal.  2. Right ventricular systolic function is normal. The right ventricular  size is normal.  3. The mitral valve is normal in structure. Trivial mitral valve  regurgitation. No evidence of mitral stenosis.  4. The aortic valve is tricuspid. Aortic valve regurgitation is not  visualized. Mild aortic valve sclerosis is present, with no evidence of  aortic valve stenosis.   Coronary CTA IMPRESSION: 1. Coronary calcium score of 0. This was 0 percentile for age and sex matched control.  2. Normal coronary origin with right dominance.  3. No evidence of CAD.  4. CAD-RADS 0. No evidence of CAD (0%). Consider non-atherosclerotic causes of chest pain.  Labs/Other Tests and Data Reviewed:    EKG:  Sinus bradycardia at 51bpm with low voltage QRS  Recent Labs: 06/30/2020: ALT 18; BUN 14; Creat 0.71; Hemoglobin 11.7; Platelets 170; Potassium 4.3; Sodium 140; TSH 0.60   Recent Lipid Panel Lab Results  Component Value  Date/Time   CHOL 174 06/30/2020 11:05 AM   TRIG 48 06/30/2020 11:05 AM   HDL 65 06/30/2020 11:05 AM   CHOLHDL 2.7 06/30/2020 11:05 AM   LDLCALC 95 06/30/2020 11:05 AM    Wt Readings from Last 3 Encounters:  07/05/20 266 lb (120.7 kg)  07/04/20 258 lb (117 kg)  04/28/20 262 lb 0.6 oz (118.9 kg)     Risk Assessment/Calculations:      Objective:    Vital Signs:  BP 114/80   Pulse (!) 51   Ht 5' 5.5" (1.664 m)   Wt 266 lb (120.7 kg)   SpO2 99%   BMI 43.59 kg/m   GEN: Well nourished, well developed in no acute distress HEENT: Normal NECK: No JVD; No carotid bruits LYMPHATICS: No lymphadenopathy CARDIAC:RRR, no murmurs, rubs, gallops RESPIRATORY:  Clear to auscultation without rales, wheezing or rhonchi  ABDOMEN: Soft, non-tender, non-distended MUSCULOSKELETAL:  No edema; No deformity  SKIN: Warm and dry NEUROLOGIC:  Alert and oriented x 3 PSYCHIATRIC:  Normal affect   ASSESSMENT & PLAN:    Palpitations -event monitor 04/2020 showed PVCs, PACs, nonsustained atrial tachycardia and nonsustained VT -coronary CTA showed no CAD and echo showed normal LVF -she is still having quite a bit of palpitations although improved on Toprol -cannot increase Toprol due to bradycardia with HRR 51bpm today -encouraged her to cut back on caffeine -continue Toprol XL 47m daily>>refilled today for 1 year -start Flecainide 568mBID (no CAD and nornal LVF) -will get an ETT in 2 weeks to rule out exercise induced arrhythmias -Shared Decision Making/Informed Consent The risks [chest pain, shortness  of breath, cardiac arrhythmias, dizziness, blood pressure fluctuations, myocardial infarction, stroke/transient ischemic attack, and life-threatening complications (estimated to be 1 in 10,000)], benefits (risk stratification, diagnosing coronary artery disease, treatment guidance) and alternatives of an exercise tolerance test were discussed in detail with Ms. Owens Shark and she agrees to proceed.  2.   Chest pain -unclear etiology and it was nonexertional but is a tightness and achiness -it has significantly improved -she does have a hx of GERD and has not been taking her Protonix recently so I encouraged her to restart her Protonix 85m daily for a month to settle things down -coronary CTA normal and 2D echo normal  3.  DOE -2Decho 04/2020 showed normal LVF  Medication Adjustments/Labs and Tests Ordered: Current medicines are reviewed at length with the patient today.  Concerns regarding medicines are outlined above.   Tests Ordered: Orders Placed This Encounter  Procedures  . EKG 12-Lead    Medication Changes: No orders of the defined types were placed in this encounter.   Follow Up: with me in 4 weeks  Signed, TFransico Him MD  07/05/2020 8:19 AM    [place]

## 2020-07-05 NOTE — Addendum Note (Signed)
Addended by: Sueanne Margarita on: 07/05/2020 04:49 PM   Modules accepted: Orders

## 2020-07-05 NOTE — Patient Instructions (Addendum)
Medication Instructions:  Your physician has recommended you make the following change in your medication:  1) START taking flecainide 50 mg twice daily   *If you need a refill on your cardiac medications before your next appointment, please call your pharmacy*   Testing/Procedures: Your physician has requested that you have an exercise tolerance test in two weeks. For further information please visit [email or web address]. Please also follow instruction sheet, as given.  Follow-Up: At [facility], you and your health needs are our priority.  As part of our continuing mission to provide you with exceptional heart care, we have created designated Provider Care Teams.  These Care Teams include your primary Cardiologist (physician) and Advanced Practice Providers (APPs -  Physician Assistants and Nurse Practitioners) who all work together to provide you with the care you need, when you need it.  Your next appointment:   4 week(s)  The format for your next appointment:   In Person  Provider:   You may see Fransico Him, MD or one of the following Advanced Practice Providers on your designated Care Team:    Melina Copa, PA-C  Ermalinda Barrios, PA-C

## 2020-07-06 ENCOUNTER — Telehealth: Payer: Self-pay

## 2020-07-06 NOTE — Telephone Encounter (Signed)
Pt called in and req to cancel her 07/28/20 lab only appt as she does not wish to come in in June and then again in [place].  She wants just labs and visit in [place].   Webb Silversmith

## 2020-07-07 ENCOUNTER — Encounter (INDEPENDENT_AMBULATORY_CARE_PROVIDER_SITE_OTHER): Payer: Self-pay | Admitting: Bariatrics

## 2020-07-07 ENCOUNTER — Encounter: Payer: Self-pay | Admitting: Obstetrics and Gynecology

## 2020-07-07 ENCOUNTER — Other Ambulatory Visit: Payer: Self-pay

## 2020-07-07 ENCOUNTER — Ambulatory Visit (INDEPENDENT_AMBULATORY_CARE_PROVIDER_SITE_OTHER): Payer: No Typology Code available for payment source | Admitting: Obstetrics and Gynecology

## 2020-07-07 ENCOUNTER — Other Ambulatory Visit (HOSPITAL_COMMUNITY): Payer: Self-pay

## 2020-07-07 ENCOUNTER — Ambulatory Visit (INDEPENDENT_AMBULATORY_CARE_PROVIDER_SITE_OTHER): Payer: No Typology Code available for payment source | Admitting: Bariatrics

## 2020-07-07 VITALS — BP 122/64 | HR 53 | Ht 66.0 in | Wt 265.0 lb

## 2020-07-07 VITALS — BP 118/71 | HR 52 | Temp 97.7°F | Ht 66.0 in | Wt 260.0 lb

## 2020-07-07 DIAGNOSIS — Z9884 Bariatric surgery status: Secondary | ICD-10-CM

## 2020-07-07 DIAGNOSIS — R5383 Other fatigue: Secondary | ICD-10-CM | POA: Diagnosis not present

## 2020-07-07 DIAGNOSIS — Z01419 Encounter for gynecological examination (general) (routine) without abnormal findings: Secondary | ICD-10-CM

## 2020-07-07 DIAGNOSIS — B9689 Other specified bacterial agents as the cause of diseases classified elsewhere: Secondary | ICD-10-CM | POA: Diagnosis not present

## 2020-07-07 DIAGNOSIS — R0602 Shortness of breath: Secondary | ICD-10-CM

## 2020-07-07 DIAGNOSIS — K219 Gastro-esophageal reflux disease without esophagitis: Secondary | ICD-10-CM | POA: Diagnosis not present

## 2020-07-07 DIAGNOSIS — Z8619 Personal history of other infectious and parasitic diseases: Secondary | ICD-10-CM | POA: Diagnosis not present

## 2020-07-07 DIAGNOSIS — E038 Other specified hypothyroidism: Secondary | ICD-10-CM

## 2020-07-07 DIAGNOSIS — Z9189 Other specified personal risk factors, not elsewhere classified: Secondary | ICD-10-CM | POA: Diagnosis not present

## 2020-07-07 DIAGNOSIS — Z5181 Encounter for therapeutic drug level monitoring: Secondary | ICD-10-CM | POA: Diagnosis not present

## 2020-07-07 DIAGNOSIS — N760 Acute vaginitis: Secondary | ICD-10-CM | POA: Diagnosis not present

## 2020-07-07 DIAGNOSIS — E538 Deficiency of other specified B group vitamins: Secondary | ICD-10-CM

## 2020-07-07 DIAGNOSIS — Z1331 Encounter for screening for depression: Secondary | ICD-10-CM

## 2020-07-07 DIAGNOSIS — Z0289 Encounter for other administrative examinations: Secondary | ICD-10-CM

## 2020-07-07 DIAGNOSIS — Z6841 Body Mass Index (BMI) 40.0 and over, adult: Secondary | ICD-10-CM

## 2020-07-07 DIAGNOSIS — E559 Vitamin D deficiency, unspecified: Secondary | ICD-10-CM | POA: Diagnosis not present

## 2020-07-07 DIAGNOSIS — E66813 Obesity, class 3: Secondary | ICD-10-CM

## 2020-07-07 DIAGNOSIS — N898 Other specified noninflammatory disorders of vagina: Secondary | ICD-10-CM | POA: Diagnosis not present

## 2020-07-07 DIAGNOSIS — Z79890 Hormone replacement therapy (postmenopausal): Secondary | ICD-10-CM

## 2020-07-07 DIAGNOSIS — M48061 Spinal stenosis, lumbar region without neurogenic claudication: Secondary | ICD-10-CM | POA: Insufficient documentation

## 2020-07-07 DIAGNOSIS — F509 Eating disorder, unspecified: Secondary | ICD-10-CM

## 2020-07-07 DIAGNOSIS — Z90710 Acquired absence of both cervix and uterus: Secondary | ICD-10-CM

## 2020-07-07 DIAGNOSIS — R7309 Other abnormal glucose: Secondary | ICD-10-CM

## 2020-07-07 DIAGNOSIS — K909 Intestinal malabsorption, unspecified: Secondary | ICD-10-CM

## 2020-07-07 LAB — WET PREP FOR TRICH, YEAST, CLUE

## 2020-07-07 MED ORDER — DIVIGEL 1 MG/GM TD GEL
TRANSDERMAL | 3 refills | Status: DC
  Filled 2020-07-07: qty 90, fill #0

## 2020-07-07 MED ORDER — METRONIDAZOLE 0.75 % VA GEL
1.0000 | Freq: Every day | VAGINAL | 0 refills | Status: DC
Start: 2020-07-07 — End: 2020-07-20
  Filled 2020-07-07: qty 70, 7d supply, fill #0

## 2020-07-07 MED ORDER — VALACYCLOVIR HCL 500 MG PO TABS
ORAL_TABLET | ORAL | 1 refills | Status: DC
  Filled 2020-07-07: qty 30, 15d supply, fill #0

## 2020-07-07 NOTE — Patient Instructions (Addendum)
EXERCISE   We recommended that you start or continue a regular exercise program for good health. Physical activity is anything that gets your body moving, some is better than none. The CDC recommends 150 minutes per week of Moderate-Intensity Aerobic Activity and 2 or more days of Muscle Strengthening Activity.  Benefits of exercise are limitless: helps weight loss/weight maintenance, improves mood and energy, helps with depression and anxiety, improves sleep, tones and strengthens muscles, improves balance, improves bone density, protects from chronic conditions such as heart disease, high blood pressure and diabetes and so much more. To learn more visit: https://www.cdc.gov/physicalactivity/index.html  DIET: Good nutrition starts with a healthy diet of fruits, vegetables, whole grains, and lean protein sources. Drink plenty of water for hydration. Minimize empty calories, sodium, sweets. For more information about dietary recommendations visit: https://health.gov/our-work/nutrition-physical-activity/dietary-guidelines and https://www.myplate.gov/  ALCOHOL:  Women should limit their alcohol intake to no more than 7 drinks/beers/glasses of wine (combined, not each!) per week. Moderation of alcohol intake to this level decreases your risk of breast cancer and liver damage.  If you are concerned that you may have a problem, or your friends have told you they are concerned about your drinking, there are many resources to help. A well-known program that is free, effective, and available to all people all over the nation is Alcoholics Anonymous.  Check out this site to learn more: https://www.aa.org/   CALCIUM AND VITAMIN D:  Adequate intake of calcium and Vitamin D are recommended for bone health.  You should be getting between 1000-1200 mg of calcium and 800 units of Vitamin D daily between diet and supplements  PAP SMEARS:  Pap smears, to check for cervical cancer or precancers,  have traditionally been  done yearly, scientific advances have shown that most women can have pap smears less often.  However, every woman still should have a physical exam from her gynecologist every year. It will include a breast check, inspection of the vulva and vagina to check for abnormal growths or skin changes, a visual exam of the cervix, and then an exam to evaluate the size and shape of the uterus and ovaries. We will also provide age appropriate advice regarding health maintenance, like when you should have certain vaccines, screening for sexually transmitted diseases, bone density testing, colonoscopy, mammograms, etc.   MAMMOGRAMS:  All women over 40 years old should have a routine mammogram.   COLON CANCER SCREENING: Now recommend starting at age 45. At this time colonoscopy is not covered for routine screening until 50. There are take home tests that can be done between 45-49.   COLONOSCOPY:  Colonoscopy to screen for colon cancer is recommended for all women at age 50.  We know, you hate the idea of the prep.  We agree, BUT, having colon cancer and not knowing it is worse!!  Colon cancer so often starts as a polyp that can be seen and removed at colonscopy, which can quite literally save your life!  And if your first colonoscopy is normal and you have no family history of colon cancer, most women don't have to have it again for 10 years.  Once every ten years, you can do something that may end up saving your life, right?  We will be happy to help you get it scheduled when you are ready.  Be sure to check your insurance coverage so you understand how much it will cost.  It may be covered as a preventative service at no cost, but you should check   your particular policy.      Breast Self-Awareness Breast self-awareness means being familiar with how your breasts look and feel. It involves checking your breasts regularly and reporting any changes to your health care provider. Practicing breast self-awareness is  important. A change in your breasts can be a sign of a serious medical problem. Being familiar with how your breasts look and feel allows you to find any problems early, when treatment is more likely to be successful. All women should practice breast self-awareness, including women who have had breast implants. How to do a breast self-exam One way to learn what is normal for your breasts and whether your breasts are changing is to do a breast self-exam. To do a breast self-exam: Look for Changes  1. Remove all the clothing above your waist. 2. Stand in front of a mirror in a room with good lighting. 3. Put your hands on your hips. 4. Push your hands firmly downward. 5. Compare your breasts in the mirror. Look for differences between them (asymmetry), such as: ? Differences in shape. ? Differences in size. ? Puckers, dips, and bumps in one breast and not the other. 6. Look at each breast for changes in your skin, such as: ? Redness. ? Scaly areas. 7. Look for changes in your nipples, such as: ? Discharge. ? Bleeding. ? Dimpling. ? Redness. ? A change in position. Feel for Changes Carefully feel your breasts for lumps and changes. It is best to do this while lying on your back on the floor and again while sitting or standing in the shower or tub with soapy water on your skin. Feel each breast in the following way:  Place the arm on the side of the breast you are examining above your head.  Feel your breast with the other hand.  Start in the nipple area and make  inch (2 cm) overlapping circles to feel your breast. Use the pads of your three middle fingers to do this. Apply light pressure, then medium pressure, then firm pressure. The light pressure will allow you to feel the tissue closest to the skin. The medium pressure will allow you to feel the tissue that is a little deeper. The firm pressure will allow you to feel the tissue close to the ribs.  Continue the overlapping circles,  moving downward over the breast until you feel your ribs below your breast.  Move one finger-width toward the center of the body. Continue to use the  inch (2 cm) overlapping circles to feel your breast as you move slowly up toward your collarbone.  Continue the up and down exam using all three pressures until you reach your armpit.  Write Down What You Find  Write down what is normal for each breast and any changes that you find. Keep a written record with breast changes or normal findings for each breast. By writing this information down, you do not need to depend only on memory for size, tenderness, or location. Write down where you are in your menstrual cycle, if you are still menstruating. If you are having trouble noticing differences in your breasts, do not get discouraged. With time you will become more familiar with the variations in your breasts and more comfortable with the exam. How often should I examine my breasts? Examine your breasts every month. If you are breastfeeding, the best time to examine your breasts is after a feeding or after using a breast pump. If you menstruate, the best time to   examine your breasts is 5-7 days after your period is over. During your period, your breasts are lumpier, and it may be more difficult to notice changes. When should I see my health care provider? See your health care provider if you notice:  A change in shape or size of your breasts or nipples.  A change in the skin of your breast or nipples, such as a reddened or scaly area.  Unusual discharge from your nipples.  A lump or thick area that was not there before.  Pain in your breasts.  Anything that concerns you.  Vaginitis  Vaginitis is a condition in which the vaginal tissue swells and becomes irritated. This condition is most often caused by a change in the normal balance of bacteria and yeast that live in the vagina. This change causes an overgrowth of certain bacteria or yeast,  which causes the inflammation. There are different types of vaginitis. What are the causes? The cause of this condition depends on the type of vaginitis. It can be caused by:  Bacteria (bacterial vaginosis).  Yeast, which is a fungus (candidiasis).  A parasite (trichomoniasis vaginitis).  A virus (viral vaginitis).  Low hormone levels (atrophic vaginitis). Low hormone levels can occur during pregnancy, breastfeeding, or after menopause.  Irritants, such as bubble baths, scented tampons, and feminine sprays (allergic vaginitis). Other factors can change the normal balance of the yeast and bacteria that live in the vagina. These include:  Antibiotic medicines.  Poor hygiene.  Diaphragms, vaginal sponges, spermicides, birth control pills, and intrauterine devices (IUDs).  Sex.  Infection.  Uncontrolled diabetes.  A weakened body defense system (immune system). What increases the risk? This condition is more likely to develop in women who:  Smoke or are exposed to secondhand smoke.  Use vaginal douches, scented tampons, or scented sanitary pads.  Wear tight-fitting pants or thong underwear.  Use oral birth control pills or an IUD.  Have sex without a condom or have multiple partners.  Have an STI.  Frequently use the spermicide nonoxynol-9.  Eat lots of foods high in sugar or who have uncontrolled diabetes.  Have low estrogen levels.  Have a weakened immune system from an immune disorder or medical treatment.  Are pregnant or breastfeeding. What are the signs or symptoms? Symptoms vary depending on the cause of the vaginitis. Common symptoms include:  Abnormal vaginal discharge. ? The discharge is white, gray, or yellow with bacterial vaginosis. ? The discharge is thick, white, and cheesy with a yeast infection. ? The discharge is frothy and yellow or greenish with trichomoniasis.  A bad vaginal smell. The smell is fishy with bacterial vaginosis.  Vaginal  itching, pain, or swelling.  Pain with sex.  Pain or burning when urinating. Sometimes there are no symptoms. How is this diagnosed? This condition is diagnosed based on your symptoms and medical history. A physical exam, including a pelvic exam, will also be done. You may also have other tests, including:  Tests to determine the pH level (acidity or alkalinity) of your vagina.  A whiff test to assess the odor that results when a sample of your vaginal discharge is mixed with a potassium hydroxide solution.  Tests of vaginal fluid. A sample will be examined under a microscope. How is this treated? Treatment varies depending on the type of vaginitis you have. Your treatment may include:  Antibiotic creams or pills to treat bacterial vaginosis and trichomoniasis.  Antifungal medicines, such as vaginal creams or suppositories, to treat a  yeast infection.  Medicine to ease discomfort if you have viral vaginitis. Your sexual partner should also be treated.  Estrogen delivered in a cream, pill, suppository, or vaginal ring to treat atrophic vaginitis. If vaginal dryness occurs, lubricants and moisturizing creams may help. You may need to avoid scented soaps, sprays, or douches.  Stopping use of a product that is causing allergic vaginitis and then using a vaginal cream to treat the symptoms. Follow these instructions at home: Lifestyle  Keep your genital area clean and dry. Avoid soap, and only rinse the area with water.  Do not douche or use tampons until your health care provider says it is okay. Use sanitary pads, if needed.  Do not have sex until your health care provider approves. When you can return to sex, practice safe sex and use condoms.  Wipe from front to back. This avoids the spread of bacteria from the rectum to the vagina. General instructions  Take over-the-counter and prescription medicines only as told by your health care provider.  If you were prescribed an  antibiotic medicine, take or use it as told by your health care provider. Do not stop taking or using the antibiotic even if you start to feel better.  Keep all follow-up visits. This is important. How is this prevented?  Use mild, unscented products. Do not use things that can irritate the vagina, such as fabric softeners. Avoid the following products if they are scented: ? Feminine sprays. ? Detergents. ? Tampons. ? Feminine hygiene products. ? Soaps or bubble baths.  Let air reach your genital area. To do this: ? Wear cotton underwear to reduce moisture buildup. ? Avoid wearing underwear while you sleep. ? Avoid wearing tight pants and underwear or nylons without a cotton panel. ? Avoid wearing thong underwear.  Take off any wet clothing, such as bathing suits, as soon as possible.  Practice safe sex and use condoms. Contact a health care provider if:  You have abdominal or pelvic pain.  You have a fever or chills.  You have symptoms that last for more than 2-3 days. Get help right away if:  You have a fever and your symptoms suddenly get worse. Summary  Vaginitis is a condition in which the vaginal tissue becomes inflamed.This condition is most often caused by a change in the normal balance of bacteria and yeast that live in the vagina.  Treatment varies depending on the type of vaginitis you have.  Do not douche, use tampons, or have sex until your health care provider approves. When you can return to sex, practice safe sex and use condoms. This information is not intended to replace advice given to you by your health care provider. Make sure you discuss any questions you have with your health care provider. Document Revised: 08/13/2019 Document Reviewed: 08/13/2019 Elsevier Patient Education  2021 [employer].  Menopause and Hormone Replacement Therapy Menopause is a normal time of life when menstrual periods stop completely and the ovaries stop producing the female  hormones estrogen and progesterone. Low levels of these hormones can affect your health and cause symptoms. Hormone replacement therapy (HRT) can relieve some of those symptoms. HRT is the use of artificial (synthetic) hormones to replace hormones that your body has stopped producing because you have reached menopause. Types of HRT HRT may consist of the synthetic hormones estrogen and progestin, or it may consist of estrogen-only therapy. You and your health care provider will decide which form of HRT is best for you.  If you choose to be on HRT and you have a uterus, estrogen and progestin are usually prescribed. Estrogen-only therapy is used for women who do not have a uterus. Possible options for taking HRT include:  Pills.  Patches.  Gels.  Sprays.  Vaginal cream.  Vaginal rings.  Vaginal inserts. The amount of hormones that you take and how long you take them varies according to your health. It is important to:  Begin HRT with the lowest possible dosage.  Stop HRT as soon as your health care provider tells you to stop.  Work with your health care provider so that you feel informed and comfortable with your decisions.   Tell a health care provider about:  Any allergies you have.  Whether you have had blood clots or know of any risk factors you may have for blood clots.  Whether you or family members have had cancer, especially cancer of the breasts, ovaries, or uterus.  Any surgeries you have had.  All medicines you are taking, including vitamins, herbs, eye drops, creams, and over-the-counter medicines.  Whether you are pregnant or may be pregnant.  Any medical conditions you have. What are the benefits? HRT can reduce the frequency and severity of menopausal symptoms. Benefits of HRT vary according to the kind of symptoms that you have, how severe they are, and your overall health. HRT may help to improve the following symptoms of menopause:  Hot flashes and night  sweats. These are sudden feelings of heat that spread over the face and body. The skin may turn red, like a blush. Night sweats are hot flashes that happen while you are sleeping or trying to sleep.  Bone loss (osteoporosis). The body loses calcium more quickly after menopause, causing the bones to become weaker. This can increase the risk for bone breaks (fractures).  Vaginal dryness. The lining of the vagina can become thin and dry, which can cause pain during sex or cause infection, burning, or itching.  Urinary tract infections.  Urinary incontinence. This is the inability to control when you urinate.  Irritability.  Short-term memory problems. What are the risks? Risks of HRT vary depending on your individual health and medical history. Risks of HRT also depend on whether you receive both estrogen and progestin or you receive estrogen only. HRT may increase the risk of:  Spotting. This is when a small amount of blood leaks from the vagina unexpectedly.  Endometrial cancer. This cancer is in the lining of the uterus (endometrium).  Breast cancer.  Increased density of breast tissue. This can make it harder to find breast cancer on a breast X-ray (mammogram).  Stroke.  Heart disease.  Blood clots.  Gallbladder disease or liver disease. Risks of HRT can increase if you have any of the following conditions:  Endometrial cancer.  Liver disease.  Heart disease.  Breast cancer.  History of blood clots.  History of stroke. Follow these instructions at home: Pap tests  Have Pap tests done as often as told by your health care provider. A Pap test is sometimes called a Pap smear. It is a screening test that is used to check for signs of cancer of the cervix and vagina. A Pap test can also identify the presence of infection or precancerous changes. Pap tests may be done: ? Every 3 years, starting at age 50. ? Every 5 years, starting after age 20, in combination with testing  for human papillomavirus (HPV). ? More often or less often depending on  other medical conditions you have, your age, and other risk factors.  It is up to you to get the results of your Pap test. Ask your health care provider, or the department that is doing the test, when your results will be ready. General instructions  Take over-the-counter and prescription medicines only as told by your health care provider.  Do not use any products that contain nicotine or tobacco. These products include cigarettes, chewing tobacco, and vaping devices, such as e-cigarettes. If you need help quitting, ask your health care provider.  Get mammograms, pelvic exams, and medical checkups as often as told by your health care provider.  Keep all follow-up visits. This is important. Contact a health care provider if you have:  Pain or swelling in your legs.  Lumps or changes in your breasts or armpits.  Pain, burning, or bleeding when you urinate.  Unusual vaginal bleeding.  Dizziness or headaches.  Pain in your abdomen. Get help right away if you have:  Shortness of breath.  Chest pain.  Slurred speech.  Weakness or numbness in any part of your arms or legs. These symptoms may represent a serious problem that is an emergency. Do not wait to see if the symptoms will go away. Get medical help right away. Call your local emergency services (911 in the [place] Do not drive yourself to the hospital. Summary  Menopause is a normal time of life when menstrual periods stop completely and the ovaries stop producing the female hormones estrogen and progesterone.  HRT can reduce the frequency and severity of menopausal symptoms.  Risks of HRT vary depending on your individual health and medical history. This information is not intended to replace advice given to you by your health care provider. Make sure you discuss any questions you have with your health care provider. Document Revised: 08/17/2019  Document Reviewed: 08/17/2019 Elsevier Patient Education  [place].
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
## 2020-07-08 ENCOUNTER — Other Ambulatory Visit (HOSPITAL_COMMUNITY): Payer: Self-pay

## 2020-07-08 LAB — VITAMIN D 25 HYDROXY (VIT D DEFICIENCY, FRACTURES): Vit D, 25-Hydroxy: 18.9 ng/mL — ABNORMAL LOW (ref 30.0–100.0)

## 2020-07-08 LAB — INSULIN, RANDOM: INSULIN: 6.2 u[IU]/mL (ref 2.6–24.9)

## 2020-07-08 LAB — VITAMIN B12: Vitamin B-12: 493 pg/mL (ref 232–1245)

## 2020-07-11 ENCOUNTER — Other Ambulatory Visit (HOSPITAL_COMMUNITY): Payer: Self-pay

## 2020-07-11 ENCOUNTER — Encounter (INDEPENDENT_AMBULATORY_CARE_PROVIDER_SITE_OTHER): Payer: Self-pay | Admitting: Bariatrics

## 2020-07-11 ENCOUNTER — Telehealth (INDEPENDENT_AMBULATORY_CARE_PROVIDER_SITE_OTHER): Payer: No Typology Code available for payment source | Admitting: Psychology

## 2020-07-11 DIAGNOSIS — Z634 Disappearance and death of family member: Secondary | ICD-10-CM | POA: Diagnosis not present

## 2020-07-11 DIAGNOSIS — F3289 Other specified depressive episodes: Secondary | ICD-10-CM | POA: Diagnosis not present

## 2020-07-11 DIAGNOSIS — F419 Anxiety disorder, unspecified: Secondary | ICD-10-CM

## 2020-07-11 MED FILL — Adalimumab Auto-injector Kit 40 MG/0.8ML: SUBCUTANEOUS | 28 days supply | Qty: 4 | Fill #1 | Status: AC

## 2020-07-11 NOTE — Progress Notes (Signed)
Dear Dr. Renold [name],   Thank you for referring Victoria [name] to our clinic. The following note includes my evaluation and treatment recommendations.  Chief Complaint:   OBESITY Victoria [name] (MR# [id number]) is a 60 y.o. female who presents for evaluation and treatment of obesity and related comorbidities. Current BMI is Body mass index is 41.97 kg/m. Victoria [name] has been struggling with her weight for many years and has been unsuccessful in either losing weight, maintaining weight loss, or reaching her healthy weight goal.  Victoria [name] is currently in the action stage of change and ready to dedicate time achieving and maintaining a healthier weight. Victoria [name] is interested in becoming our patient and working on intensive lifestyle modifications including (but not limited to) diet and exercise for weight loss.  Victoria [name] does not like to cook, but due to working and recovery from surgery. She craves salty, savory foods. She dislikes meats.  Victoria [name]'s habits were reviewed today and are as follows: Her family eats meals together, she thinks her family will eat healthier with her, she struggles with family and or coworkers weight loss sabotage, her desired weight loss is at least 61 lbs, she has been heavy most of her life, she started gaining weight after having twins in 1985, her heaviest weight ever was 292 pounds, she is a picky eater and doesn't like to eat healthier foods, she has significant food cravings issues, she snacks frequently in the evenings, she wakes up frequently in the middle of the night to eat, she skips meals frequently, she is frequently drinking liquids with calories, she frequently makes poor food choices, she has problems with excessive hunger, she frequently eats larger portions than normal, she has binge eating behaviors and she struggles with emotional eating.  Depression Screen Victoria [name]'s Food and Mood (modified PHQ-9) score was 20.  Depression screen [facility] 2/9  07/07/2020  Decreased Interest 3  Down, Depressed, Hopeless 3  PHQ - 2 Score 6  Altered sleeping 2  Tired, decreased energy 3  Change in appetite 3  Feeling bad or failure about yourself  3  Trouble concentrating 2  Moving slowly or fidgety/restless 1  Suicidal thoughts 0  PHQ-9 Score 20  Difficult doing work/chores Not difficult at all  Some recent data might be hidden   Subjective:   1. Other fatigue Victoria [name] admits to daytime somnolence and admits to waking up still tired. Patent has a history of symptoms of daytime fatigue, morning fatigue and morning headache. Victoria [name] generally gets 3 or 4 hours of sleep per night, and states that she has poor sleep quality. Snoring is present. Apneic episodes are not present. Epworth Sleepiness Score is 12.  2. SOB (shortness of breath) on exertion Victoria [name] notes increasing shortness of breath with exercising and seems to be worsening over time with weight gain. She notes getting out of breath sooner with activity than she used to. This has gotten worse recently. Victoria [name] denies shortness of breath at rest or orthopnea.  3. Gastroesophageal reflux disease without esophagitis Victoria [name] reports current reflux. She is taking Protonix.  4. Other specified hypothyroidism Symptoms are controlled.  5. 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 Hassell Done. Victoria [name]'s highest weight is 292 and lowest is 185.   6. Vitamin B 12 deficiency Victoria [name] has a history of pernicious anemia. She is taking B12 injections.  7. Vitamin D deficiency Victoria [name] was taking liquid calcium and Vit D3.   8. Iron malabsorption Victoria [name] has history of iron infusion about  2 years ago. History of pernicious anemia.   9. Elevated glucose Reactive hyperglycemia.  10. Disordered eating Victoria [name]'s PHQ-9 score is 20.  11. At risk for malnutrition Victoria [name] is at risk for malnutrition due to history of post gastric surgery syndrome.  Assessment/Plan:   1. Other fatigue Victoria [name] does  feel that her weight is causing her energy to be lower than it should be. Fatigue may be related to obesity, depression or many other causes. Labs will be ordered, and in the meanwhile, [name] will focus on self care including making healthy food choices, increasing physical activity and focusing on stress reduction. Check labs today.  - Vitamin B12 - Insulin, random - VITAMIN D 25 Hydroxy (Vit-D Deficiency, Fractures)  2. SOB (shortness of breath) on exertion Victoria [name] does feel that she gets out of breath more easily that she used to when she exercises. Victoria [name]'s shortness of breath appears to be obesity related and exercise induced. She has agreed to work on weight loss and gradually increase exercise to treat her exercise induced shortness of breath. Will continue to monitor closely.  3. Gastroesophageal reflux disease without esophagitis Intensive lifestyle modifications are the first line treatment for this issue. We discussed several lifestyle modifications today and she will continue to work on diet, exercise and weight loss efforts. Orders and follow up as documented in patient record. Continue Protonix.  Counseling . If a person has gastroesophageal reflux disease (GERD), food and stomach acid move back up into the esophagus and cause symptoms or problems such as damage to the esophagus. . Anti-reflux measures include: raising the head of the bed, avoiding tight clothing or belts, avoiding eating late at night, not lying down shortly after mealtime, and achieving weight loss. . Avoid ASA, NSAID's, caffeine, alcohol, and tobacco.  . OTC Pepcid and/or Tums are often very helpful for as needed use.  Marland Kitchen However, for persisting chronic or daily symptoms, stronger medications like Omeprazole may be needed. . You may need to avoid foods and drinks such as: ? Coffee and tea (with or without caffeine). ? Drinks that contain alcohol. ? Energy drinks and sports drinks. ? Bubbly (carbonated) drinks or  sodas. ? Chocolate and cocoa. ? Peppermint and mint flavorings. ? Garlic and onions. ? Horseradish. ? Spicy and acidic foods. These include peppers, chili powder, curry powder, vinegar, hot sauces, and BBQ sauce. ? Citrus fruit juices and citrus fruits, such as oranges, lemons, and limes. ? Tomato-based foods. These include red sauce, chili, salsa, and pizza with red sauce. ? Fried and fatty foods. These include donuts, french fries, potato chips, and high-fat dressings. ? High-fat meats. These include hot dogs, rib eye steak, sausage, ham, and bacon.  4. Other specified hypothyroidism Patient with long-standing hypothyroidism, on levothyroxine therapy. She appears euthyroid. Orders and follow up as documented in patient record. Continue current treatment plan.  Counseling . Good thyroid control is important for overall health. Supratherapeutic thyroid levels are dangerous and will not improve weight loss results. . The correct way to take levothyroxine is fasting, with water, separated by at least 30 minutes from breakfast, and separated by more than 4 hours from calcium, iron, multivitamins, acid reflux medications (PPIs).   5. H/O gastric bypass Will restart bariatric vitamins. Check labs today.  - Vitamin B12 - Insulin, random - VITAMIN D 25 Hydroxy (Vit-D Deficiency, Fractures) - Vitamin B1  6. Vitamin B 12 deficiency The diagnosis was reviewed with the patient. Counseling provided today, see below. We will continue to monitor.  Orders and follow up as documented in patient record. Check labs today.  Counseling . The body needs vitamin B12: to make red blood cells; to make DNA; and to help the nerves work properly so they can carry messages from the brain to the body.  . The main causes of vitamin B12 deficiency include dietary deficiency, digestive diseases, pernicious anemia, and having a surgery in which part of the stomach or small intestine is removed.  . Certain medicines  can make it harder for the body to absorb vitamin B12. These medicines include: heartburn medications; some antibiotics; some medications used to treat diabetes, gout, and high cholesterol.  . In some cases, there are no symptoms of this condition. If the condition leads to anemia or nerve damage, various symptoms can occur, such as weakness or fatigue, shortness of breath, and numbness or tingling in your hands and feet.   . Treatment:  o May include taking vitamin B12 supplements.  o Avoid alcohol.  o Eat lots of healthy foods that contain vitamin B12: - Beef, pork, chicken, [place], and organ meats, such as liver.  - Seafood: This includes clams, rainbow trout, salmon, tuna, and haddock. Eggs.  - Cereal and dairy products that are fortified: This means that vitamin B12 has been added to the food.  - Vitamin B12  7. Vitamin D deficiency Low Vitamin D level contributes to fatigue and are associated with obesity, breast, and colon cancer. She agrees to follow-up for routine testing of Vitamin D, at least 2-3 times per year to avoid over-replacement. Check labs today.  - VITAMIN D 25 Hydroxy (Vit-D Deficiency, Fractures)  8. Iron malabsorption Will follow labs, and she will continue the B 12.   9. Elevated glucose Fasting labs will be obtained and results with be discussed with Victoria [name] in 2 weeks at her follow up visit. In the meanwhile Victoria [name] was started on a lower simple carbohydrate diet and will work on weight loss efforts. Check labs today.  - Insulin, random  10. Disordered eating Behavior modification techniques were discussed today to help Victoria [name] deal with her emotional/non-hunger eating behaviors.  Orders and follow up as documented in patient record. Refer to Dr. Mallie Mussel.  33. Depression screen Victoria [name] had a positive depression screening. Depression is commonly associated with obesity and often results in emotional eating behaviors. We will monitor this closely and work on CBT to  help improve the non-hunger eating patterns. Referral to Psychology may be required if no improvement is seen as she continues in our clinic.  12. At risk for malnutrition Victoria [name] was given approximately 15 minutes of counseling today regarding prevention of malnutrition and ways to meet macronutrient goals..   13. Obesity, current BMI 42  Victoria [name] is currently in the action stage of change and her goal is to continue with weight loss efforts. I recommend Victoria [name] begin the structured treatment plan as follows:  She has agreed to the Category 2 Plan and the [place].  Meal plan 06/30/2020 labs reviewed with pt  Exercise goals: Increase exercise and walking.   Behavioral modification strategies: increasing lean protein intake, decreasing simple carbohydrates, increasing vegetables, increasing water intake, decreasing eating out, no skipping meals, meal planning and cooking strategies, keeping healthy foods in the home and planning for success.  She was informed of the importance of frequent follow-up visits to maximize her success with intensive lifestyle modifications for her multiple health conditions. She was informed we would discuss her lab results at her next visit unless  there is a critical issue that needs to be addressed sooner. Victoria [name] agreed to keep her next visit at the agreed upon time to discuss these results.  Objective:   Blood pressure 118/71, pulse (!) 52, temperature 97.7 F (36.5 C), height 5\' 6"  (1.676 m), weight 260 lb (117.9 kg), SpO2 100 %. Body mass index is 41.97 kg/m.  EKG: Normal sinus rhythm, rate 51.  Indirect Calorimeter completed today shows a VO2 of 240 and a REE of 1671.  Her calculated basal metabolic rate is [id number] thus her basal metabolic rate is worse than expected.  General: Cooperative, alert, well developed, in no acute distress. HEENT: Conjunctivae and lids unremarkable. Cardiovascular: Regular rhythm.  Lungs: Normal work of  breathing. Neurologic: No focal deficits.   Lab Results  Component Value Date   CREATININE 0.71 06/30/2020   BUN 14 06/30/2020   NA 140 06/30/2020   K 4.3 06/30/2020   CL 106 06/30/2020   CO2 28 06/30/2020   Lab Results  Component Value Date   ALT 18 06/30/2020   AST 18 06/30/2020   ALKPHOS 59 10/16/2018   BILITOT 0.4 06/30/2020   Lab Results  Component Value Date   HGBA1C 5.4 06/30/2020   HGBA1C 5.7 (H) 05/16/2020   HGBA1C 5.5 03/15/2017   HGBA1C 5.7 (H) 09/09/2015   HGBA1C 5.7 (H) 08/20/2013   Lab Results  Component Value Date   INSULIN 6.2 07/07/2020   INSULIN 4 09/16/2015   Lab Results  Component Value Date   TSH 0.60 06/30/2020   Lab Results  Component Value Date   CHOL 174 06/30/2020   HDL 65 06/30/2020   LDLCALC 95 06/30/2020   TRIG 48 06/30/2020   CHOLHDL 2.7 06/30/2020   Lab Results  Component Value Date   WBC 3.0 (L) 06/30/2020   HGB 11.7 06/30/2020   HCT 36.3 06/30/2020   MCV 98.9 06/30/2020   PLT 170 06/30/2020   Lab Results  Component Value Date   IRON 124 04/28/2020   TIBC 293 04/28/2020   FERRITIN 42 04/28/2020    Attestation Statements:   Reviewed by clinician on day of visit: allergies, medications, problem list, medical history, surgical history, family history, social history, and previous encounter notes.  Coral Ceo, CMA, am acting as Location manager for [employer], DO.  I have reviewed the above documentation for accuracy and completeness, and I agree with the above. Jearld Lesch, DO

## 2020-07-11 NOTE — Progress Notes (Signed)
Office: [phone]  /  Fax: [phone]    Date: Jul 18, 2020   Appointment Start Time: 3:01pm Duration: 24 minutes Provider: Glennie Isle, Psy.D. Type of Session: Individual Therapy  Location of Patient: Home Location of Provider: Provider's home (private office) Type of Contact: Telepsychological Visit via MyChart Video Visit  Session Content: Victoria [name] is a 60 y.o. female presenting for a follow-up appointment to address the previously established treatment goal of increasing coping skills. Today's appointment was a telepsychological visit due to COVID-19. Victoria [name] provided verbal consent for today's telepsychological appointment and she is aware she is responsible for securing confidentiality on her end of the session. Prior to proceeding with today's appointment, Victoria [name]'s physical location at the time of this appointment was obtained as well a phone number she could be reached at in the event of technical difficulties. Victoria [name] and this provider participated in today's telepsychological service. Of note, Victoria [name] provided verbal consent at 3:09pm to switch to a regular telephone due to an unstable connection, noting it is raining where she lives.   This provider conducted a brief check-in. Victoria [name] shared things are going well, noting she is focusing on following her meal plan. She described "feeling good" about giving her body what it needs. Notably, Victoria [name] stated she completed the new patient paperwork with [facility]; however, she declined to schedule when they called again. She stated she had "second thoughts," adding she is coping via praying and feels confident in her ability to continue coping. She expressed willingness to discuss longer term therapeutic services further in the future if deemed necessary.   Psychoeducation regarding triggers for emotional eating was provided. Victoria [name] was provided a handout, and encouraged to utilize the handout between now and the next  appointment to increase awareness of triggers and frequency. Victoria [name] agreed. This provider also discussed behavioral strategies for specific triggers, such as placing the utensil down when conversing to avoid mindless eating. Victoria [name] provided verbal consent during today's appointment for this provider to send a handout about triggers for emotional eating via e-mail. Overall, Victoria [name] was receptive to today's appointment as evidenced by openness to sharing, responsiveness to feedback, and willingness to explore triggers for emotional eating.  Mental Status Examination:  Appearance: well groomed and appropriate hygiene  Behavior: appropriate to circumstances Mood: euthymic Affect: mood congruent Speech: normal in rate, volume, and tone Eye Contact: appropriate Psychomotor Activity: unable to assess  Gait: unable to assess Thought Process: linear, logical, and goal directed  Thought Content/Perception: no hallucinations, delusions, bizarre thinking or behavior reported or observed and no evidence or endorsement of suicidal and homicidal ideation, plan, and intent Orientation: time, person, place, and purpose of appointment Memory/Concentration: memory, attention, language, and fund of knowledge intact  Insight/Judgment: fair  Interventions:  Conducted a brief chart review Provided empathic reflections and validation Employed supportive psychotherapy interventions to facilitate reduced distress and to improve coping skills with identified stressors Psychoeducation provided regarding triggers for emotional eating  DSM-5 Diagnosis(es): F32.89 Other Specified Depressive Disorder, Emotional Eating Behaviors, F41.9 Unspecified Anxiety Disorder and G25.4 Uncomplicated Bereavement  Treatment Goal & Progress: During the initial appointment with this provider, the following treatment goal was established: increase coping skills. Progress is limited, as Victoria [name] has just begun treatment with this provider;  however, she is receptive to the interaction and interventions and rapport is being established.   Plan: Victoria [name] declined future appointments with this provider at this time, noting she wants to see "how it goes with Dr. Owens Shark." She acknowledged understanding that  she may request a follow-up appointment with this provider in the future as long as she is still established with the clinic. No further follow-up planned by this provider.

## 2020-07-12 LAB — SURESWAB, VAGINOSIS/VAGINITIS PLUS
Atopobium vaginae: 7.8 Log (cells/mL)
BV CATEGORY: UNDETERMINED — AB
C. albicans, DNA: NOT DETECTED
C. glabrata, DNA: NOT DETECTED
C. parapsilosis, DNA: NOT DETECTED
C. trachomatis RNA, TMA: NOT DETECTED
C. tropicalis, DNA: NOT DETECTED
Gardnerella vaginalis: NOT DETECTED Log (cells/mL)
LACTOBACILLUS SPECIES: 6.3 Log (cells/mL)
MEGASPHAERA SPECIES: NOT DETECTED Log (cells/mL)
N. gonorrhoeae RNA, TMA: NOT DETECTED
Trichomonas vaginalis RNA: NOT DETECTED

## 2020-07-12 NOTE — Telephone Encounter (Signed)
Pt last seen by Dr. Tippen.  

## 2020-07-12 NOTE — Telephone Encounter (Signed)
Please review

## 2020-07-13 ENCOUNTER — Other Ambulatory Visit (HOSPITAL_COMMUNITY): Payer: Self-pay

## 2020-07-14 ENCOUNTER — Other Ambulatory Visit (HOSPITAL_COMMUNITY): Payer: Self-pay

## 2020-07-18 ENCOUNTER — Telehealth (INDEPENDENT_AMBULATORY_CARE_PROVIDER_SITE_OTHER): Payer: No Typology Code available for payment source | Admitting: Psychology

## 2020-07-18 DIAGNOSIS — F3289 Other specified depressive episodes: Secondary | ICD-10-CM

## 2020-07-18 DIAGNOSIS — Z634 Disappearance and death of family member: Secondary | ICD-10-CM | POA: Diagnosis not present

## 2020-07-18 DIAGNOSIS — F419 Anxiety disorder, unspecified: Secondary | ICD-10-CM | POA: Diagnosis not present

## 2020-07-19 ENCOUNTER — Encounter: Payer: Self-pay | Admitting: Obstetrics and Gynecology

## 2020-07-19 ENCOUNTER — Telehealth: Payer: Self-pay

## 2020-07-19 DIAGNOSIS — R079 Chest pain, unspecified: Secondary | ICD-10-CM

## 2020-07-19 NOTE — Telephone Encounter (Signed)
Attestation completed

## 2020-07-20 ENCOUNTER — Encounter: Payer: Self-pay | Admitting: *Deleted

## 2020-07-20 ENCOUNTER — Other Ambulatory Visit: Payer: Self-pay | Admitting: Obstetrics and Gynecology

## 2020-07-20 ENCOUNTER — Other Ambulatory Visit (HOSPITAL_COMMUNITY): Payer: Self-pay

## 2020-07-20 DIAGNOSIS — N761 Subacute and chronic vaginitis: Secondary | ICD-10-CM

## 2020-07-20 MED ORDER — CLINDAMYCIN PHOSPHATE 2 % VA CREA
1.0000 | TOPICAL_CREAM | Freq: Every day | VAGINAL | 1 refills | Status: DC
  Filled 2020-07-20: qty 120, 42d supply, fill #0

## 2020-07-20 NOTE — Telephone Encounter (Signed)
You told patient to follow up if symptoms continue.

## 2020-07-20 NOTE — Progress Notes (Signed)
The patient was treated for BV, also had signs of DIV. 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 She has been treated 2 x with steroid suppositories for a month at a time.  Will treat with 6 weeks of nightly cleocin cream, script sent.

## 2020-07-21 ENCOUNTER — Other Ambulatory Visit: Payer: Self-pay

## 2020-07-21 ENCOUNTER — Other Ambulatory Visit (HOSPITAL_COMMUNITY): Payer: Self-pay

## 2020-07-21 ENCOUNTER — Encounter (INDEPENDENT_AMBULATORY_CARE_PROVIDER_SITE_OTHER): Payer: Self-pay | Admitting: Bariatrics

## 2020-07-21 ENCOUNTER — Ambulatory Visit (INDEPENDENT_AMBULATORY_CARE_PROVIDER_SITE_OTHER): Payer: No Typology Code available for payment source | Admitting: Bariatrics

## 2020-07-21 ENCOUNTER — Telehealth: Payer: Self-pay | Admitting: Internal Medicine

## 2020-07-21 VITALS — BP 121/75 | HR 64 | Temp 97.8°F | Ht 66.0 in | Wt 246.0 lb

## 2020-07-21 DIAGNOSIS — E559 Vitamin D deficiency, unspecified: Secondary | ICD-10-CM

## 2020-07-21 DIAGNOSIS — Z6841 Body Mass Index (BMI) 40.0 and over, adult: Secondary | ICD-10-CM | POA: Diagnosis not present

## 2020-07-21 DIAGNOSIS — Z9189 Other specified personal risk factors, not elsewhere classified: Secondary | ICD-10-CM

## 2020-07-21 DIAGNOSIS — K219 Gastro-esophageal reflux disease without esophagitis: Secondary | ICD-10-CM | POA: Diagnosis not present

## 2020-07-21 MED ORDER — VITAMIN D (ERGOCALCIFEROL) 1.25 MG (50000 UNIT) PO CAPS
50000.0000 [IU] | ORAL_CAPSULE | ORAL | 0 refills | Status: DC
Start: 2020-07-21 — End: 2020-08-22
  Filled 2020-07-21: qty 4, 28d supply, fill #0

## 2020-07-21 NOTE — Telephone Encounter (Signed)
Completed [employer] renewal PA on Cover My Meds. Key: [id number]. Awaiting determination from [employer].

## 2020-07-21 NOTE — Telephone Encounter (Signed)
Peggie Hornak [phone]  [name] just called to say her husband has tested positive for COVID, at this time she is having no symptoms. He started getting sick on Monday. I let her know we would be out of office for Holiday until Tuesday, and to call us back at that time if she is having symptoms or go to urgent care if needs to before then.

## 2020-07-21 NOTE — Telephone Encounter (Signed)
The request has been approved. The authorization is effective for a maximum of 12 fills from 07/21/2020 to 07/20/2021, as long as the member is enrolled in their current health plan. This has been approved for a quantity limit of 18 with a day supply limit of 30. A written notification letter will follow with additional details.

## 2020-07-22 ENCOUNTER — Other Ambulatory Visit (HOSPITAL_COMMUNITY): Payer: Self-pay

## 2020-07-26 ENCOUNTER — Other Ambulatory Visit: Payer: Self-pay

## 2020-07-26 ENCOUNTER — Other Ambulatory Visit (HOSPITAL_COMMUNITY): Payer: Self-pay

## 2020-07-26 ENCOUNTER — Ambulatory Visit (INDEPENDENT_AMBULATORY_CARE_PROVIDER_SITE_OTHER): Payer: No Typology Code available for payment source

## 2020-07-26 DIAGNOSIS — R0600 Dyspnea, unspecified: Secondary | ICD-10-CM

## 2020-07-26 DIAGNOSIS — R079 Chest pain, unspecified: Secondary | ICD-10-CM

## 2020-07-26 DIAGNOSIS — Z79899 Other long term (current) drug therapy: Secondary | ICD-10-CM

## 2020-07-26 DIAGNOSIS — R0609 Other forms of dyspnea: Secondary | ICD-10-CM

## 2020-07-26 LAB — EXERCISE TOLERANCE TEST
Estimated workload: 6.5 METS
Exercise duration (min): 6 min
Exercise duration (sec): 0 s
MPHR: 161 {beats}/min
Peak HR: 125 {beats}/min
Percent HR: 77 %
RPE: 17
Rest HR: 52 {beats}/min

## 2020-07-26 MED FILL — Rimegepant Sulfate Tab Disint 75 MG: ORAL | 30 days supply | Qty: 8 | Fill #1 | Status: AC

## 2020-07-27 ENCOUNTER — Other Ambulatory Visit (HOSPITAL_COMMUNITY): Payer: Self-pay

## 2020-07-27 NOTE — Progress Notes (Signed)
Chief Complaint:   OBESITY Victoria [name] is here to discuss her progress with her obesity treatment plan along with follow-up of her obesity related diagnoses. Victoria [name] is on the Category 2 Plan or the [place] and states she is following her eating plan approximately 100% of the time. Victoria [name] states she is doing 0 minutes 0 times per week.  Today's visit was #: 2 Starting weight: 260 lbs Starting date: 07/07/2020 Today's weight: 246 lbs Today's date: 07/21/2020 Total lbs lost to date: 14 Total lbs lost since last in-office visit: 14  Interim History: Victoria [name] is down 14 lbs since her last visit. She states that as long as she has food available to eat, she can stick to it. She is working on getting adequate protein.   Subjective:   1. Vitamin D deficiency Victoria [name] is taking multivitamins, and her last Vit D level was 18.9.  2. Gastroesophageal reflux disease without esophagitis Victoria [name] is currently taking Protonix.  3. At risk for osteoporosis Victoria [name] is at higher risk of osteopenia and osteoporosis due to Vitamin D deficiency.   Assessment/Plan:   1. Vitamin D deficiency Low Vitamin D level contributes to fatigue and are associated with obesity, breast, and colon cancer. Victoria [name] agreed to start prescription Vitamin D 50,000 IU every week with no refills. She will follow-up for routine testing of Vitamin D, at least 2-3 times per year to avoid over-replacement.  - Vitamin D, Ergocalciferol, (DRISDOL) 1.25 MG (50000 UNIT) CAPS capsule; Take 1 capsule mouth every 7 days.  Dispense: 4 capsule; Refill: 0  2. Gastroesophageal reflux disease without esophagitis Intensive lifestyle modifications are the first line treatment for this issue. We discussed several lifestyle modifications today. Victoria [name] will continue Protonix, and will continue to work on diet, exercise and weight loss efforts. She is to avoid trigger foods that exhibate GERD symptoms. Orders and follow up as documented in patient  record.   Counseling . If a person has gastroesophageal reflux disease (GERD), food and stomach acid move back up into the esophagus and cause symptoms or problems such as damage to the esophagus. . Anti-reflux measures include: raising the head of the bed, avoiding tight clothing or belts, avoiding eating late at night, not lying down shortly after mealtime, and achieving weight loss. . Avoid ASA, NSAID's, caffeine, alcohol, and tobacco.  . OTC Pepcid and/or Tums are often very helpful for as needed use.  Marland Kitchen However, for persisting chronic or daily symptoms, stronger medications like Omeprazole may be needed. . You may need to avoid foods and drinks such as: ? Coffee and tea (with or without caffeine). ? Drinks that contain alcohol. ? Energy drinks and sports drinks. ? Bubbly (carbonated) drinks or sodas. ? Chocolate and cocoa. ? Peppermint and mint flavorings. ? Garlic and onions. ? Horseradish. ? Spicy and acidic foods. These include peppers, chili powder, curry powder, vinegar, hot sauces, and BBQ sauce. ? Citrus fruit juices and citrus fruits, such as oranges, lemons, and limes. ? Tomato-based foods. These include red sauce, chili, salsa, and pizza with red sauce. ? Fried and fatty foods. These include donuts, french fries, potato chips, and high-fat dressings. ? High-fat meats. These include hot dogs, rib eye steak, sausage, ham, and bacon.  3. At risk for osteoporosis Victoria [name] was given approximately 15 minutes of osteoporosis prevention counseling today. Victoria [name] is at risk for osteopenia and osteoporosis due to her Vitamin D deficiency. She was encouraged to take her Vitamin D and follow her higher calcium diet and  increase strengthening exercise to help strengthen her bones and decrease her risk of osteopenia and osteoporosis.  Repetitive spaced learning was employed today to elicit superior memory formation and behavioral change.  4. Obesity, current BMI 28 Victoria [name] is currently in  the action stage of change. As such, her goal is to continue with weight loss efforts. She has agreed to the Category 2 Plan.   Mindful eating was discussed. I reviewed labs from 07/07/2020 with the patient today.  Exercise goals: No exercise has been prescribed at this time.  Behavioral modification strategies: increasing lean protein intake, decreasing simple carbohydrates, increasing vegetables, increasing water intake, decreasing eating out, no skipping meals, meal planning and cooking strategies, keeping healthy foods in the home and planning for success.  Victoria [name] has agreed to follow-up with our clinic in 2 weeks. She was informed of the importance of frequent follow-up visits to maximize her success with intensive lifestyle modifications for her multiple health conditions.   Objective:   Blood pressure 121/75, pulse 64, temperature 97.8 F (36.6 C), height 5\' 6"  (1.676 m), weight 246 lb (111.6 kg), SpO2 97 %. Body mass index is 39.71 kg/m.  General: Cooperative, alert, well developed, in no acute distress. HEENT: Conjunctivae and lids unremarkable. Cardiovascular: Regular rhythm.  Lungs: Normal work of breathing. Neurologic: No focal deficits.   Lab Results  Component Value Date   CREATININE 0.71 06/30/2020   BUN 14 06/30/2020   NA 140 06/30/2020   K 4.3 06/30/2020   CL 106 06/30/2020   CO2 28 06/30/2020   Lab Results  Component Value Date   ALT 18 06/30/2020   AST 18 06/30/2020   ALKPHOS 59 10/16/2018   BILITOT 0.4 06/30/2020   Lab Results  Component Value Date   HGBA1C 5.4 06/30/2020   HGBA1C 5.7 (H) 05/16/2020   HGBA1C 5.5 03/15/2017   HGBA1C 5.7 (H) 09/09/2015   HGBA1C 5.7 (H) 08/20/2013   Lab Results  Component Value Date   INSULIN 6.2 07/07/2020   INSULIN 4 09/16/2015   Lab Results  Component Value Date   TSH 0.60 06/30/2020   Lab Results  Component Value Date   CHOL 174 06/30/2020   HDL 65 06/30/2020   LDLCALC 95 06/30/2020   TRIG 48 06/30/2020    CHOLHDL 2.7 06/30/2020   Lab Results  Component Value Date   WBC 3.0 (L) 06/30/2020   HGB 11.7 06/30/2020   HCT 36.3 06/30/2020   MCV 98.9 06/30/2020   PLT 170 06/30/2020   Lab Results  Component Value Date   IRON 124 04/28/2020   TIBC 293 04/28/2020   FERRITIN 42 04/28/2020   Attestation Statements:   Reviewed by clinician on day of visit: allergies, medications, problem list, medical history, surgical history, family history, social history, and previous encounter notes.   Wilhemena Durie, am acting as Location manager for [employer], DO.  I have reviewed the above documentation for accuracy and completeness, and I agree with the above. Jearld Lesch, DO

## 2020-07-28 ENCOUNTER — Encounter (INDEPENDENT_AMBULATORY_CARE_PROVIDER_SITE_OTHER): Payer: Self-pay | Admitting: Bariatrics

## 2020-07-28 ENCOUNTER — Telehealth: Payer: Self-pay | Admitting: *Deleted

## 2020-07-28 ENCOUNTER — Other Ambulatory Visit: Payer: No Typology Code available for payment source

## 2020-07-28 NOTE — Telephone Encounter (Signed)
Patient left voicemail on Triage line stating she thinks she may be having an allergic reaction to clindamycin cream that was prescribed.   RN returned call to patient. Patient states that she was treated for BV with flagyl mid May. States she broke out in a rash on her lower right leg, but had an appointment with PCP who looked at rash and prescribed triamcinolone for itching. Patient states she did finish 5 day course of flagyl, but symptoms returned once treatment was completed. Patient was then prescribed clindamycin gel. Started prescription on Monday night and Tuesday night woke up and her back was broken out in whelps and she was itchy. Took 50mg  of benadryl with little relief. Also tried triamcinolone cream, but states that wears off quickly. Patient also states she is using fluocinolone scalp oil 2 weeks on and 1 week off. Restarted that medication on Tuesday. Unsure if reaction is from clindamycin or fluocinolone, but patient concerned. Is not due to use clindamycin cream again until tonight and states she does not want to use anymore. Patient states she is available to come in after 1 pm if needed.  RN advised Dr. Talbert Nan is out of the office today, but would review with covering provider and return call with additional recommendations. Patient agreeable.   Routing to provider for review.

## 2020-07-28 NOTE — Telephone Encounter (Signed)
I recommend she discontinue the Clindamycin.  Please make a follow up appointment with Dr. Talbert Nan.

## 2020-07-28 NOTE — Telephone Encounter (Signed)
Call to patient. Message given to patient as seen below from Dr. Quincy Simmonds. Patient scheduled for follow up appointment on 08-04-20 at 0915. Patient agreeable to date and time of appointment.   Routing to provider and will close encounter.   Cc Dr. Talbert Nan

## 2020-07-30 ENCOUNTER — Other Ambulatory Visit (HOSPITAL_COMMUNITY): Payer: Self-pay

## 2020-08-01 ENCOUNTER — Other Ambulatory Visit (HOSPITAL_COMMUNITY): Payer: Self-pay

## 2020-08-02 ENCOUNTER — Other Ambulatory Visit (HOSPITAL_COMMUNITY): Payer: Self-pay

## 2020-08-02 MED ORDER — HYDROCORTISONE ACETATE 25 MG RE SUPP
RECTAL | 0 refills | Status: DC
  Filled 2020-08-02: qty 45, 45d supply, fill #0

## 2020-08-02 NOTE — Telephone Encounter (Addendum)
The patient has a h/o desquamative inflammatory vaginitis. She has previously improved with vaginal steroid treatment. She was tried on clindamycin, which is the other option for treating DIV. Since she isn't tolerating the clindamycin, please call in hydrocortisone suppositories, 25 mg. Place one suppository vaginal qhs. Please have her do this nightly for 6 weeks (call in 45 suppositories), then have her f/u.  Let her know that we may need to wean her off of the steroid or use a maintenance dose to try and prevent recurrence. I'm happy to see her on 08/04/20 if she still wants to be seen (otherwise cancel the appointment).

## 2020-08-02 NOTE — Telephone Encounter (Signed)
Message left to return call to Triage Nurse at [phone].   

## 2020-08-02 NOTE — Addendum Note (Signed)
Addended by: Thamas Jaegers on: 08/02/2020 02:19 PM   Modules accepted: Orders

## 2020-08-02 NOTE — Telephone Encounter (Signed)
Patient informed with all the below note, Rx sent. Patient wanted to cancel appointment.

## 2020-08-03 ENCOUNTER — Other Ambulatory Visit (HOSPITAL_COMMUNITY): Payer: Self-pay

## 2020-08-03 ENCOUNTER — Telehealth: Payer: Self-pay

## 2020-08-03 DIAGNOSIS — Z006 Encounter for examination for normal comparison and control in clinical research program: Secondary | ICD-10-CM

## 2020-08-03 NOTE — Telephone Encounter (Signed)
I have attempted without success to contact this patient by phone for her Identify 90 day follow up phone call. I left a message for patient to return my phone call with my name and callback number. An e-mail was also sent to patient.  

## 2020-08-04 ENCOUNTER — Ambulatory Visit: Payer: No Typology Code available for payment source | Admitting: Obstetrics and Gynecology

## 2020-08-04 ENCOUNTER — Other Ambulatory Visit: Payer: Self-pay

## 2020-08-04 ENCOUNTER — Other Ambulatory Visit (HOSPITAL_COMMUNITY): Payer: Self-pay

## 2020-08-04 ENCOUNTER — Ambulatory Visit (INDEPENDENT_AMBULATORY_CARE_PROVIDER_SITE_OTHER): Payer: No Typology Code available for payment source | Admitting: Bariatrics

## 2020-08-04 ENCOUNTER — Encounter: Payer: Self-pay | Admitting: Cardiology

## 2020-08-04 ENCOUNTER — Ambulatory Visit: Payer: No Typology Code available for payment source | Admitting: Cardiology

## 2020-08-04 ENCOUNTER — Encounter (INDEPENDENT_AMBULATORY_CARE_PROVIDER_SITE_OTHER): Payer: Self-pay | Admitting: Bariatrics

## 2020-08-04 ENCOUNTER — Encounter: Payer: Self-pay | Admitting: Orthopaedic Surgery

## 2020-08-04 ENCOUNTER — Ambulatory Visit: Payer: No Typology Code available for payment source | Admitting: Orthopaedic Surgery

## 2020-08-04 VITALS — BP 109/73 | HR 55 | Temp 97.5°F | Ht 66.0 in | Wt 246.0 lb

## 2020-08-04 VITALS — BP 100/56 | HR 61 | Ht 66.0 in | Wt 246.0 lb

## 2020-08-04 DIAGNOSIS — R0609 Other forms of dyspnea: Secondary | ICD-10-CM

## 2020-08-04 DIAGNOSIS — Z6841 Body Mass Index (BMI) 40.0 and over, adult: Secondary | ICD-10-CM

## 2020-08-04 DIAGNOSIS — G4709 Other insomnia: Secondary | ICD-10-CM

## 2020-08-04 DIAGNOSIS — K219 Gastro-esophageal reflux disease without esophagitis: Secondary | ICD-10-CM

## 2020-08-04 DIAGNOSIS — M1711 Unilateral primary osteoarthritis, right knee: Secondary | ICD-10-CM

## 2020-08-04 DIAGNOSIS — R079 Chest pain, unspecified: Secondary | ICD-10-CM

## 2020-08-04 DIAGNOSIS — G8929 Other chronic pain: Secondary | ICD-10-CM | POA: Diagnosis not present

## 2020-08-04 DIAGNOSIS — M25561 Pain in right knee: Secondary | ICD-10-CM

## 2020-08-04 DIAGNOSIS — R0600 Dyspnea, unspecified: Secondary | ICD-10-CM | POA: Diagnosis not present

## 2020-08-04 DIAGNOSIS — Z9189 Other specified personal risk factors, not elsewhere classified: Secondary | ICD-10-CM

## 2020-08-04 DIAGNOSIS — I471 Supraventricular tachycardia: Secondary | ICD-10-CM | POA: Diagnosis not present

## 2020-08-04 MED ORDER — PANTOPRAZOLE SODIUM 40 MG PO TBEC: DELAYED_RELEASE_TABLET | ORAL | Status: DC

## 2020-08-04 MED ORDER — METOPROLOL SUCCINATE ER 25 MG PO TB24
ORAL_TABLET | Freq: Every day | ORAL | 1 refills | Status: DC
  Filled 2020-08-04: qty 90, 90d supply, fill #0
  Filled 2020-11-14: qty 90, 90d supply, fill #1

## 2020-08-04 MED ORDER — DOXEPIN HCL 150 MG PO CAPS
150.0000 mg | ORAL_CAPSULE | Freq: Every day | ORAL | 0 refills | Status: DC
  Filled 2020-08-04: qty 30, 30d supply, fill #0

## 2020-08-04 MED ORDER — FLECAINIDE ACETATE 50 MG PO TABS
50.0000 mg | ORAL_TABLET | Freq: Two times a day (BID) | ORAL | 1 refills | Status: DC
  Filled 2020-08-04 – 2020-11-14 (×2): qty 180, 90d supply, fill #0

## 2020-08-04 NOTE — Progress Notes (Signed)
Date:  08/04/2020   ID:  Victoria [name], DOB 1961-02-23, MRN [id number] The patient was identified using 2 identifiers. PCP:  Elby Showers, MD  Cardiologist:  Fransico Him, MD  Electrophysiologist:  None   Evaluation Performed: Office Followup  Chief Complaint:  Palpitations/chest pain  History of Present Illness:    Victoria [name] is a 60 y.o. female with with a history of GERD, hiatal hernia and chest pain.  She was initially see by me several years ago for chest pain and nuclear stress test showed low risk study with a very small anterior perfusion defect that most likely represents breast attenuation artifact. Less likely this could represent a true perfusion abnormality in the distribution of a diagonal artery branch of the LAD.  Her EF was normal.  She was started on Protonix for possible GERD symptoms and CP resolved.   She last saw me in 2018 at which time she was having SOB.  She had a gastric bypass in 2016 and had not felt well since then.  Her DOE was with talking and walking and also was having some epigastric pain at night.  2D echo was normal which was normal. She had an ETT done which showed no ischemia.    She recently was seen by her GYN MD and was noted to have a low HR in the 40's.  She has been monitoring her heart beat recently and has noted that she has been having some fluttering in her chest and this is followed by the need to cough.  Her PCP did some orthostatic BPs recently due to dizziness and her BP dropped from 120/80 to 100/36mmHg.   She also has had some chest tightness and some pain going down her neck on the left side but they are not associated with each other.  She has been having some DOE when moving around doing housework and is new for her but does not get chest pain with it.   We did a 2D echo which was normal and coronary CTA showed no CAD and Ca score of 0.  She wore an event monitor which showed PVCs, PACs, nonsustained atrial  tachycardia, NSVT and she was placed on Toprol XL 25mg  daily.  When I last saw her she was still complaining of a lot of palpitations and I started her on Flecainide 50mg  BID as we could not increase BB due to resting bradycardia.   She is here today for followup and is doing well.  She denies any chest pain or pressure, SOB, DOE, PND, orthopnea, LE edema, dizziness, palpitations or syncope. She is compliant with her meds and is tolerating meds with no SE.     Past Medical History:  Diagnosis Date   Back pain    Bursitis    right shoulder   Chronic leukopenia    intermittant since 2010, followed by Dr. Renold Genta now   Constipation    Degenerative joint disease of low back 09/02/15    Dr. Maureen Ralphs   Edema of both lower legs    Fibrocystic breast    Gallbladder problem    GERD (gastroesophageal reflux disease)    Headache    per pt more stress/tension   Heart palpitations    SEE EPIC [facility] , CARDIOLOGY DR. Fransico Him 2018; reports on 05-16-17 " i haven't had any bouts of those lately"     Hemorrhoids    History of Clostridium difficile infection 12/2010   History of exercise  intolerance    ETT on 04-27-2016-- negative ([facility] treadmill score 7)   History of Helicobacter pylori infection 2001 and 09/ 2012   HSV-2 infection    genital   Hx of adenomatous colonic polyps 10/17/2007   Hydradenitis    per pt currently treated with Humira injection   Hypothyroidism, postsurgical 1980   IBS (irritable bowel syndrome)    Joint pain    Medial meniscus tear    right knee   Osteoarthritis    Palpitations    Pernicious anemia    b12 def   PONV (postoperative nausea and vomiting)    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   Prediabetes    Reactive hypoglycemia followed by pcp   post gastrectomy dumping syndrome   SOB (shortness of breath)    Tendonitis    right shoulder   Varicose veins    Vitamin B 12 deficiency    Vitamin D deficiency    Past Surgical History:  Procedure  Laterality Date   BREATH TEK H PYLORI N/A 05/03/2014   Procedure: BREATH TEK H PYLORI;  Surgeon: Kaylyn Lim, MD;  Location: [facility];  Service: General;  Laterality: N/A;   CARDIOVASCULAR STRESS TEST  03/11/2013   dr Tressia Miners Dyon Rotert   Low risk nuclear study w/ a very small anterior perfusion defect that most likely represents breast attenuation artifact, less likely this could represent a true perfusion abnormality in the distribution of diagonal branch of the LAD/  normal LV function and wall motion , ef 66%   CATARACT EXTRACTION W/ INTRAOCULAR LENS  IMPLANT, BILATERAL  10/2017   [place]   COLONOSCOPY  02/2017   Dr [name] ; per patient they found 7 polyps with polypectomy; on 5 year track    FINE NEEDLE ASPIRATION Left 05/22/2017   Procedure: LEFT KNEE ASPIRATION;  Surgeon: Gaynelle Arabian, MD;  Location: [facility];  Service: Orthopedics;  Laterality: Left;   GASTRIC ROUX-EN-Y N/A 07/06/2014   Procedure: LAPAROSCOPIC ROUX-EN-Y GASTRIC BYPASS, ENTEROLYSIS OF ADHESIONS WITH UPPER ENDOSCOPY;  Surgeon: Johnathan Hausen, MD;  Location: [facility];  Service: General;  Laterality: N/A;   HAMMER TOE SURGERY Bilateral 02/17/2008   bilateral foot digit's 2 and 5   HIATAL HERNIA REPAIR N/A 07/06/2014   Procedure: LAPAROSCOPIC REPAIR OF HIATAL HERNIA;  Surgeon: Johnathan Hausen, MD;  Location: [facility];  Service: General;  Laterality: N/A;   I & D KNEE WITH POLY EXCHANGE Left 12/11/2019   Procedure: LEFT KNEE POLY-LINER EXCHANGE;  Surgeon: Mcarthur Rossetti, MD;  Location: [place];  Service: Orthopedics;  Laterality: Left;   KNEE ARTHROSCOPY Right 12/11/2019   Procedure: RIGHT KNEE ARTHROSCOPY WITH PARTIAL MEDIAL MENISCECTOMY;  Surgeon: Mcarthur Rossetti, MD;  Location: [place];  Service: Orthopedics;  Laterality: Right;   KNEE ARTHROSCOPY WITH MEDIAL MENISECTOMY Left 07/17/2017   Procedure: LEFT KNEE ARTHROSCOPY WITH MEDIAL LATERAL MENISECTOMY;  Surgeon: Gaynelle Arabian,  MD;  Location: [facility];  Service: Orthopedics;  Laterality: Left;   MASS EXCISION N/A 10/11/2016   Procedure: EXCISION OF PERIANAL MASS;  Surgeon: [name], MD;  Location: [place];  Service: General;  Laterality: N/A;   [place]  08-25-2002   dr Margaretha Glassing   ovaries retained   TOTAL HIP ARTHROPLASTY Right 05/21/2012   Procedure: TOTAL HIP ARTHROPLASTY;  Surgeon: Gearlean Alf, MD;  Location: [facility];  Service: Orthopedics;  Laterality: Right;   TOTAL HIP ARTHROPLASTY Left 01-31-2009  dr Wynelle Link   TOTAL HIP REVISION Left 05/22/2017   Procedure: Left hip bearing surface and head revision;  Surgeon: Gaynelle Arabian, MD;  Location: [facility];  Service: Orthopedics;  Laterality: Left;   TOTAL KNEE ARTHROPLASTY Left 01/20/2018   Procedure: LEFT TOTAL KNEE ARTHROPLASTY;  Surgeon: Gaynelle Arabian, MD;  Location: [facility];  Service: Orthopedics;  Laterality: Left;  62min   TRANSTHORACIC ECHOCARDIOGRAM  05/03/2016   ef 55-60%/  trivial MR/  mild TR   TUBAL LIGATION       Current Meds  Medication Sig   Adalimumab 40 MG/0.8ML PNKT INJECT ONE 40MG /0.8ML PEN UNDER THE SKIN EACH WEEK.   ALPRAZolam (XANAX) 1 MG tablet TAKE 1 TABLET BY MOUTH AT BEDTIME   Calcium Carb-Cholecalciferol (CALCIUM + D3 PO) Take 1 tablet by mouth daily.   celecoxib (CELEBREX) 200 MG capsule TAKE 1 CAPSULE BY MOUTH 2 TIMES DAILY BETWEEN MEALS AS NEEDED.   clindamycin (CLEOCIN) 2 % vaginal cream Place 1 Applicatorful vaginally at bedtime. Use nightly x 6 weeks.   cyanocobalamin (,VITAMIN B-12,) 1000 MCG/ML injection INJECT 1 ML INTRAMUSCULARLY EVERY MONTH   doxepin (SINEQUAN) 150 MG capsule Take 1 capsule (150 mg total) by mouth at bedtime.   Estradiol (DIVIGEL) 1 MG/GM GEL APPLY TO THE SKIN AT BEDTIME   flecainide (TAMBOCOR) 50 MG tablet Take 1 tablet (50 mg total) by mouth 2 (two) times daily.   Fluocinolone Acetonide Scalp 0.01 % OIL Apply to  scalp twice daily for 2 weeks, stop for 1 week then repeat   gabapentin (NEURONTIN) 600 MG tablet TAKE 1 TABLET BY MOUTH 3 TIMES DAILY   HYDROcodone-acetaminophen (NORCO) 10-325 MG tablet TAKE 1 TABLET BY MOUTH EVERY 8 HOURS AS NEEDED FOR KNEE PAIN   hydrocortisone (ANUSOL-HC) 25 MG suppository Insert one suppository vaginal at bedtime nightly for 6 weeks.   levothyroxine (SYNTHROID) 112 MCG tablet TAKE 1 TABLET BY MOUTH ONCE A DAY   metoprolol succinate (TOPROL-XL) 25 MG 24 hr tablet TAKE 1 TABLET (25 MG TOTAL) BY MOUTH DAILY.   Multiple Vitamin (MULTIVITAMIN WITH MINERALS) TABS tablet Take 1 tablet by mouth daily.   NURTEC 75 MG TBDP DISSOLVE 1 TABLET BY MOUTH DAILY AS NEEDED FOR MIGRAINES. TAKE AS CLOSE TO ONSET OF MIGRAINE AS POSSIBLE. ONE DAILY MAXIMUM.   pantoprazole (PROTONIX) 40 MG tablet TAKE 1 TABLET BY MOUTH TWICE DAILY--- PRN   polyethylene glycol (MIRALAX / GLYCOLAX) packet Take 17 g by mouth daily as needed for mild constipation.   promethazine (PHENERGAN) 25 MG tablet TAKE 1 TABLET BY MOUTH EVERY 8 HOURS AS NEEDED FOR NAUSEA AND/OR VOMITING   sulfamethoxazole-trimethoprim (BACTRIM DS) 800-160 MG tablet Take 1 Tablet by mouth twice daily for 1 month   tiZANidine (ZANAFLEX) 4 MG capsule Take 1 capsule (4 mg total) by mouth 3 (three) times daily.   triamcinolone cream (KENALOG) 0.1 % Apply topically 4 (four) times daily as directed.   valACYclovir (VALTREX) 500 MG tablet TAKE 1 TABLET BY MOUTH TWICE DAILY AS NEEDED (Patient taking differently: TAKE 1 TABLET BY MOUTH TWICE DAILY AS NEEDED)   Vitamin D, Ergocalciferol, (DRISDOL) 1.25 MG (50000 UNIT) CAPS capsule Take 1 capsule mouth every 7 days.     Allergies:   Other and Estradiol   Social History   Tobacco Use   Smoking status: Never   Smokeless tobacco: Never  Vaping Use   Vaping Use: Never used  Substance Use Topics   Alcohol  use: No   Drug use: No     Family Hx: The patient's family history includes Diabetes in her  father and sister; Headache in her mother; Heart disease in her father; High Cholesterol in her father and mother; High blood pressure in her father; Hypertension in her mother and sister; Obesity in her father and mother; Thyroid disease in her mother.  ROS:   Please see the history of present illness.     All other systems reviewed and are negative.   Prior CV studies:   The following studies were reviewed today:  Event monitor 04/2020 Study Highlights    Predominant rhythm was normal sinus rhythm with average heart rate 65bpm. The heart rate ranged from 42 to 127bpm. Frequent PACs, atrial couplets, atrial bigeminy Nonsustained atrial tachycardia up to 24 beats in a row with fastest rate up to 200bpm. Occasional PVCs and venctiular couplets. Wide complex tachycardia up to 5 beats in a row.    2D echo 2022 IMPRESSIONS   1. Left ventricular ejection fraction, by estimation, is 65 to 70%. Left  ventricular ejection fraction by 3D volume is 66 %. The left ventricle has  normal function. The left ventricle has no regional wall motion  abnormalities. Left ventricular diastolic   parameters were normal. The average left ventricular global longitudinal  strain is -26.9 %. The global longitudinal strain is normal.   2. Right ventricular systolic function is normal. The right ventricular  size is normal.   3. The mitral valve is normal in structure. Trivial mitral valve  regurgitation. No evidence of mitral stenosis.   4. The aortic valve is tricuspid. Aortic valve regurgitation is not  visualized. Mild aortic valve sclerosis is present, with no evidence of  aortic valve stenosis.   Coronary CTA IMPRESSION: 1. Coronary calcium score of 0. This was 0 percentile for age and sex matched control.   2. Normal coronary origin with right dominance.   3. No evidence of CAD.   4. CAD-RADS 0. No evidence of CAD (0%). Consider non-atherosclerotic causes of chest pain.  Labs/Other Tests  and Data Reviewed:    EKG:  Sinus bradycardia at 51bpm with low voltage QRS  Recent Labs: 06/30/2020: ALT 18; BUN 14; Creat 0.71; Hemoglobin 11.7; Platelets 170; Potassium 4.3; Sodium 140; TSH 0.60   Recent Lipid Panel Lab Results  Component Value Date/Time   CHOL 174 06/30/2020 11:05 AM   TRIG 48 06/30/2020 11:05 AM   HDL 65 06/30/2020 11:05 AM   CHOLHDL 2.7 06/30/2020 11:05 AM   LDLCALC 95 06/30/2020 11:05 AM    Wt Readings from Last 3 Encounters:  08/04/20 246 lb (111.6 kg)  08/04/20 246 lb (111.6 kg)  07/21/20 246 lb (111.6 kg)     Risk Assessment/Calculations:      Objective:    Vital Signs:  BP (!) 100/56   Pulse 61   Ht 5\' 6"  (1.676 m)   Wt 246 lb (111.6 kg)   SpO2 99%   BMI 39.71 kg/m   GEN: Well nourished, well developed in no acute distress HEENT: Normal NECK: No JVD; No carotid bruits LYMPHATICS: No lymphadenopathy CARDIAC:RRR, no murmurs, rubs, gallops RESPIRATORY:  Clear to auscultation without rales, wheezing or rhonchi  ABDOMEN: Soft, non-tender, non-distended MUSCULOSKELETAL:  No edema; No deformity  SKIN: Warm and dry NEUROLOGIC:  Alert and oriented x 3 PSYCHIATRIC:  Normal affect   ASSESSMENT & PLAN:    Palpitations/Nonsustained atrial tachycardia -event monitor 04/2020 showed PVCs, PACs, nonsustained atrial tachycardia  and nonsustained VT -coronary CTA showed no CAD and echo showed normal LVF -at last OV she was having a lot of palpitations but could not increase BB due to bradycardia -we started Flecainide 50mg  BID and she underwent ETT 2 weeks later with no exercise induced arrhythmias -her palpitations have completely resolved with addition of Flecainide -Continue prescription drug management with Toprol XL 25mg  daily and Flecainide 50mg  BID >> refills sent in today  Chest pain -unclear etiology and it was nonexertional but is a tightness and achiness -improved on Protonix and likely related to GERD -coronary CTA normal and 2D echo  normal  DOE -2Decho 04/2020 showed normal LVF  Medication Adjustments/Labs and Tests Ordered: Current medicines are reviewed at length with the patient today.  Concerns regarding medicines are outlined above.   Tests Ordered: No orders of the defined types were placed in this encounter.   Medication Changes: No orders of the defined types were placed in this encounter.   Follow Up: with me in 4 weeks  Signed, Fransico Him, MD  08/04/2020 11:27 AM    [place]

## 2020-08-04 NOTE — Addendum Note (Signed)
Addended by: Antonieta Iba on: 08/04/2020 11:59 AM   Modules accepted: Orders

## 2020-08-04 NOTE — Progress Notes (Signed)
The patient is very well-known to me.  I have been following her with chronic knee pain for some time now.  Her most recent MRI of the right knee shows progression of her osteoarthritis of that knee.  She has tried activity modification, anti-inflammatories, numerous steroid injections for the right knee, hyaluronic acid injection for the right knee and even arthroscopic intervention for the right knee.  She has lost weight and her BMI today is down to 39.71.  She is still on a weight loss journey and wants to try to get any little stronger but she is gone to the point where her right knee pain is detriment affecting her mobility, her quality of life and actives daily living and she like to consider knee replacement surgery.  She also talked about the possibility of at least 1 more hyaluronic acid injection and I think both the total knee or even trying an hyaluronic acid injection more time would be worthwhile.  She has a left knee replaced so she understands what the surgery involves in detail.  She was seen by cardiology today.  She does not need to see them for 6 months but she has been dealing with small episodes of SVT.  She is on medication for this.  She denies any chest pain or shortness of breath.  She denies any fever, chills, nausea, vomiting.  Examination of her left knee shows good alignment.  That is had a total knee arthroplasty and a polyliner exchange with upsizing her poly-.  Examination of her right knee shows a mild effusion.  The knee does hyperextend and has valgus malalignment.  The medial lateral joint lines are painful as well as there is patellofemoral palpitation with flexion and extension.  At this point I agree with proceeding with either hyaluronic acid injection to try that 1 more time versus a total knee arthroplasty.  She is going to talk to her husband and talk to her job and let us know which she would like to try next.  All question concerns were answered and addressed.

## 2020-08-04 NOTE — Patient Instructions (Signed)

## 2020-08-05 ENCOUNTER — Other Ambulatory Visit (HOSPITAL_COMMUNITY): Payer: Self-pay

## 2020-08-05 MED ORDER — TRIAMCINOLONE ACETONIDE 0.1 % EX CREA
TOPICAL_CREAM | CUTANEOUS | 1 refills | Status: DC
  Filled 2020-08-05: qty 80, 30d supply, fill #0

## 2020-08-05 MED ORDER — LEVOCETIRIZINE DIHYDROCHLORIDE 5 MG PO TABS
5.0000 mg | ORAL_TABLET | Freq: Every evening | ORAL | 2 refills | Status: DC
  Filled 2020-08-05: qty 30, 30d supply, fill #0

## 2020-08-08 ENCOUNTER — Other Ambulatory Visit (HOSPITAL_COMMUNITY): Payer: Self-pay

## 2020-08-08 MED FILL — Adalimumab Auto-injector Kit 40 MG/0.8ML: SUBCUTANEOUS | 28 days supply | Qty: 4 | Fill #2 | Status: AC

## 2020-08-09 NOTE — Progress Notes (Signed)
Chief Complaint:   OBESITY Victoria [name] is here to discuss her progress with her obesity treatment plan along with follow-up of her obesity related diagnoses. Victoria [name] is on the Category 2 Plan and states she is following her eating plan approximately 20% of the time. Victoria [name] states she is not currently exercising.   Today's visit was #: 3 Starting weight: 260 lbs Starting date: 07/07/2020 Today's weight: 246 lbs Today's date: 08/04/2020 Total lbs lost to date: 14 Total lbs lost since last in-office visit: 0  Interim History: Victoria [name]'s weight remains the same. She states that she is hungry at night. She is trying maintain 90 grams of protein.  Subjective:   1. Gastroesophageal reflux disease without esophagitis Victoria [name] is taking Protonix.  2. Other insomnia Victoria [name] is taking Xanax occasionally (previously working at night). She sleeps 3-4 hours.  3. At risk for impaired metabolic function Victoria [name] is at increased risk for impaired metabolic function due to insomnia and obesity.  Assessment/Plan:   1. Gastroesophageal reflux disease without esophagitis Intensive lifestyle modifications are the first line treatment for this issue. We discussed several lifestyle modifications today and she will continue to work on diet, exercise and weight loss efforts. Orders and follow up as documented in patient record.   Counseling If a person has gastroesophageal reflux disease (GERD), food and stomach acid move back up into the esophagus and cause symptoms or problems such as damage to the esophagus. Anti-reflux measures include: raising the head of the bed, avoiding tight clothing or belts, avoiding eating late at night, not lying down shortly after mealtime, and achieving weight loss. Avoid ASA, NSAID's, caffeine, alcohol, and tobacco.  OTC Pepcid and/or Tums are often very helpful for as needed use.  However, for persisting chronic or daily symptoms, stronger medications like Omeprazole may be  needed. You may need to avoid foods and drinks such as: Coffee and tea (with or without caffeine). Drinks that contain alcohol. Energy drinks and sports drinks. Bubbly (carbonated) drinks or sodas. Chocolate and cocoa. Peppermint and mint flavorings. Garlic and onions. Horseradish. Spicy and acidic foods. These include peppers, chili powder, curry powder, vinegar, hot sauces, and BBQ sauce. Citrus fruit juices and citrus fruits, such as oranges, lemons, and limes. Tomato-based foods. These include red sauce, chili, salsa, and pizza with red sauce. Fried and fatty foods. These include donuts, french fries, potato chips, and high-fat dressings. High-fat meats. These include hot dogs, rib eye steak, sausage, ham, and bacon.  - pantoprazole (PROTONIX) 40 MG tablet; TAKE 1 TABLET BY MOUTH TWICE DAILY--- PRN  2. Other insomnia The problem of recurrent insomnia was discussed. Orders and follow up as documented in patient record. Counseling: Intensive lifestyle modifications are the first line treatment for this issue. We discussed several lifestyle modifications today and she will continue to work on diet, exercise and weight loss efforts.  Start doxepin 150 mg, as prescribed below.  Counseling Limit or avoid alcohol, caffeinated beverages, and cigarettes, especially close to bedtime.  Do not eat a large meal or eat spicy foods right before bedtime. This can lead to digestive discomfort that can make it hard for you to sleep. Keep a sleep diary to help you and your health care provider figure out what could be causing your insomnia.  Make your bedroom a dark, comfortable place where it is easy to fall asleep. Put up shades or blackout curtains to block light from outside. Use a white noise machine to block noise. Keep the temperature cool. Limit  screen use before bedtime. This includes: Watching TV. Using your smartphone, tablet, or computer. Stick to a routine that includes going to bed  and waking up at the same times every day and night. This can help you fall asleep faster. Consider making a quiet activity, such as reading, part of your nighttime routine. Try to avoid taking naps during the day so that you sleep better at night. Get out of bed if you are still awake after 15 minutes of trying to sleep. Keep the lights down, but try reading or doing a quiet activity. When you feel sleepy, go back to bed.  - doxepin (SINEQUAN) 150 MG capsule; Take 1 capsule (150 mg total) by mouth at bedtime.  Dispense: 30 capsule; Refill: 0  3. At risk for impaired metabolic function Victoria [name] was given approximately 15 minutes of impaired  metabolic function prevention counseling today. We discussed intensive lifestyle modifications today with an emphasis on specific nutrition and exercise instructions and strategies.   Repetitive spaced learning was employed today to elicit superior memory formation and behavioral change.  4. Obesity current BMI 37  Victoria [name] is currently in the action stage of change. As such, her goal is to continue with weight loss efforts. She has agreed to the Category 2 Plan.   Meal plan Will adhere closely to the diet.  Exercise goals:  Walking at work.  Behavioral modification strategies: increasing lean protein intake, decreasing simple carbohydrates, increasing vegetables, increasing water intake, decreasing eating out, no skipping meals, meal planning and cooking strategies, keeping healthy foods in the home, and planning for success.  Victoria [name] has agreed to follow-up with our clinic in 2 weeks. She was informed of the importance of frequent follow-up visits to maximize her success with intensive lifestyle modifications for her multiple health conditions.   Objective:   Blood pressure 109/73, pulse (!) 55, temperature (!) 97.5 F (36.4 C), height 5\' 6"  (1.676 m), weight 246 lb (111.6 kg), SpO2 100 %. Body mass index is 39.71 kg/m.  General: Cooperative, alert,  well developed, in no acute distress. HEENT: Conjunctivae and lids unremarkable. Cardiovascular: Regular rhythm.  Lungs: Normal work of breathing. Neurologic: No focal deficits.   Lab Results  Component Value Date   CREATININE 0.71 06/30/2020   BUN 14 06/30/2020   NA 140 06/30/2020   K 4.3 06/30/2020   CL 106 06/30/2020   CO2 28 06/30/2020   Lab Results  Component Value Date   ALT 18 06/30/2020   AST 18 06/30/2020   ALKPHOS 59 10/16/2018   BILITOT 0.4 06/30/2020   Lab Results  Component Value Date   HGBA1C 5.4 06/30/2020   HGBA1C 5.7 (H) 05/16/2020   HGBA1C 5.5 03/15/2017   HGBA1C 5.7 (H) 09/09/2015   HGBA1C 5.7 (H) 08/20/2013   Lab Results  Component Value Date   INSULIN 6.2 07/07/2020   INSULIN 4 09/16/2015   Lab Results  Component Value Date   TSH 0.60 06/30/2020   Lab Results  Component Value Date   CHOL 174 06/30/2020   HDL 65 06/30/2020   LDLCALC 95 06/30/2020   TRIG 48 06/30/2020   CHOLHDL 2.7 06/30/2020   Lab Results  Component Value Date   WBC 3.0 (L) 06/30/2020   HGB 11.7 06/30/2020   HCT 36.3 06/30/2020   MCV 98.9 06/30/2020   PLT 170 06/30/2020   Lab Results  Component Value Date   IRON 124 04/28/2020   TIBC 293 04/28/2020   FERRITIN 42 04/28/2020  Attestation Statements:   Reviewed by clinician on day of visit: allergies, medications, problem list, medical history, surgical history, family history, social history, and previous encounter notes.  Coral Ceo, CMA, am acting as Location manager for [employer], DO.  I have reviewed the above documentation for accuracy and completeness, and I agree with the above. Jearld Lesch, DO

## 2020-08-10 ENCOUNTER — Other Ambulatory Visit (HOSPITAL_COMMUNITY): Payer: Self-pay

## 2020-08-10 ENCOUNTER — Encounter: Payer: Self-pay | Admitting: Family

## 2020-08-11 ENCOUNTER — Other Ambulatory Visit (HOSPITAL_COMMUNITY): Payer: Self-pay

## 2020-08-11 MED ORDER — HYDROXYZINE HCL 10 MG PO TABS
ORAL_TABLET | ORAL | 1 refills | Status: DC
  Filled 2020-08-11: qty 90, 30d supply, fill #0

## 2020-08-11 MED ORDER — FAMOTIDINE 40 MG PO TABS
40.0000 mg | ORAL_TABLET | Freq: Two times a day (BID) | ORAL | 2 refills | Status: DC
  Filled 2020-08-11: qty 60, 30d supply, fill #0

## 2020-08-16 ENCOUNTER — Encounter (INDEPENDENT_AMBULATORY_CARE_PROVIDER_SITE_OTHER): Payer: Self-pay | Admitting: Bariatrics

## 2020-08-16 NOTE — Telephone Encounter (Signed)
Please review

## 2020-08-19 ENCOUNTER — Ambulatory Visit: Payer: No Typology Code available for payment source

## 2020-08-22 ENCOUNTER — Ambulatory Visit (INDEPENDENT_AMBULATORY_CARE_PROVIDER_SITE_OTHER): Payer: No Typology Code available for payment source | Admitting: Bariatrics

## 2020-08-22 ENCOUNTER — Other Ambulatory Visit: Payer: Self-pay

## 2020-08-22 ENCOUNTER — Other Ambulatory Visit (HOSPITAL_COMMUNITY): Payer: Self-pay

## 2020-08-22 ENCOUNTER — Encounter (INDEPENDENT_AMBULATORY_CARE_PROVIDER_SITE_OTHER): Payer: Self-pay | Admitting: Bariatrics

## 2020-08-22 VITALS — BP 117/76 | HR 57 | Temp 97.9°F | Ht 66.0 in | Wt 251.0 lb

## 2020-08-22 DIAGNOSIS — K219 Gastro-esophageal reflux disease without esophagitis: Secondary | ICD-10-CM | POA: Diagnosis not present

## 2020-08-22 DIAGNOSIS — E559 Vitamin D deficiency, unspecified: Secondary | ICD-10-CM | POA: Diagnosis not present

## 2020-08-22 DIAGNOSIS — Z6841 Body Mass Index (BMI) 40.0 and over, adult: Secondary | ICD-10-CM | POA: Diagnosis not present

## 2020-08-22 DIAGNOSIS — Z9189 Other specified personal risk factors, not elsewhere classified: Secondary | ICD-10-CM

## 2020-08-22 MED ORDER — VITAMIN D (ERGOCALCIFEROL) 1.25 MG (50000 UNIT) PO CAPS
50000.0000 [IU] | ORAL_CAPSULE | ORAL | 0 refills | Status: DC
  Filled 2020-08-22: qty 4, 28d supply, fill #0

## 2020-08-25 ENCOUNTER — Encounter (INDEPENDENT_AMBULATORY_CARE_PROVIDER_SITE_OTHER): Payer: Self-pay | Admitting: Bariatrics

## 2020-08-25 NOTE — Progress Notes (Signed)
Chief Complaint:   OBESITY Victoria [name] is here to discuss her progress with her obesity treatment plan along with follow-up of her obesity related diagnoses. Victoria [name] is on the Category 2 Plan and states she is following her eating plan approximately 0% of the time. Victoria [name] states she is doing 0 minutes 0 times per week.  Today's visit was #: 4 Starting weight: 260 lbs Starting date: 07/07/2020 Today's weight: 251 lbs Today's date: 08/22/2020 Total lbs lost to date: 9 Total lbs lost since last in-office visit: 0  Interim History: Victoria [name] is up 5 lbs since her last visit. She is on steroids still and she is eating out due to lack of a refrigerator.   Subjective:   1. Vitamin D deficiency Victoria [name] denies nausea, vomiting, or muscle weakness on Vit D.  2. Gastroesophageal reflux disease without esophagitis Victoria [name] is currently taking Protonix.  3. At risk for osteoporosis Victoria [name] is at higher risk of osteopenia and osteoporosis due to Vitamin D deficiency.   Assessment/Plan:   1. Vitamin D deficiency Low Vitamin D level contributes to fatigue and are associated with obesity, breast, and colon cancer. We will refill prescription Vitamin D for 1 month. Victoria [name] will follow-up for routine testing of Vitamin D, at least 2-3 times per year to avoid over-replacement.  - Vitamin D, Ergocalciferol, (DRISDOL) 1.25 MG (50000 UNIT) CAPS capsule; Take 1 capsule by mouth every 7 days.  Dispense: 4 capsule; Refill: 0  2. Gastroesophageal reflux disease without esophagitis Intensive lifestyle modifications are the first line treatment for this issue. We discussed several lifestyle modifications today. Victoria [name] will continue her medications, and will continue to work on diet, exercise and weight loss efforts. Orders and follow up as documented in patient record.   Counseling If a person has gastroesophageal reflux disease (GERD), food and stomach acid move back up into the esophagus and cause symptoms or  problems such as damage to the esophagus. Anti-reflux measures include: raising the head of the bed, avoiding tight clothing or belts, avoiding eating late at night, not lying down shortly after mealtime, and achieving weight loss. Avoid ASA, NSAID's, caffeine, alcohol, and tobacco.  OTC Pepcid and/or Tums are often very helpful for as needed use.  However, for persisting chronic or daily symptoms, stronger medications like Omeprazole may be needed. You may need to avoid foods and drinks such as: Coffee and tea (with or without caffeine). Drinks that contain alcohol. Energy drinks and sports drinks. Bubbly (carbonated) drinks or sodas. Chocolate and cocoa. Peppermint and mint flavorings. Garlic and onions. Horseradish. Spicy and acidic foods. These include peppers, chili powder, curry powder, vinegar, hot sauces, and BBQ sauce. Citrus fruit juices and citrus fruits, such as oranges, lemons, and limes. Tomato-based foods. These include red sauce, chili, salsa, and pizza with red sauce. Fried and fatty foods. These include donuts, french fries, potato chips, and high-fat dressings. High-fat meats. These include hot dogs, rib eye steak, sausage, ham, and bacon.  3. At risk for osteoporosis Victoria [name] was given approximately 15 minutes of osteoporosis prevention counseling today. Victoria [name] is at risk for osteopenia and osteoporosis due to her Vitamin D deficiency. She was encouraged to take her Vitamin D and follow her higher calcium diet and increase strengthening exercise to help strengthen her bones and decrease her risk of osteopenia and osteoporosis.  Repetitive spaced learning was employed today to elicit superior memory formation and behavioral change.  4. Obesity current BMI 40.6 Victoria [name] is currently in the action stage of change.  As such, her goal is to continue with weight loss efforts. She has agreed to the Category 2 Plan.   Exercise goals: No exercise has been prescribed at this  time.  Behavioral modification strategies: increasing lean protein intake, decreasing simple carbohydrates, increasing vegetables, increasing water intake, decreasing eating out, no skipping meals, meal planning and cooking strategies, keeping healthy foods in the home, and planning for success.  Victoria [name] has agreed to follow-up with our clinic in 2 weeks. She was informed of the importance of frequent follow-up visits to maximize her success with intensive lifestyle modifications for her multiple health conditions.   Objective:   Pulse (!) 57, temperature 97.9 F (36.6 C), height 5\' 6"  (1.676 m), weight 251 lb (113.9 kg), SpO2 98 %. Body mass index is 40.51 kg/m.  General: Cooperative, alert, well developed, in no acute distress. HEENT: Conjunctivae and lids unremarkable. Cardiovascular: Regular rhythm.  Lungs: Normal work of breathing. Neurologic: No focal deficits.   Lab Results  Component Value Date   CREATININE 0.71 06/30/2020   BUN 14 06/30/2020   NA 140 06/30/2020   K 4.3 06/30/2020   CL 106 06/30/2020   CO2 28 06/30/2020   Lab Results  Component Value Date   ALT 18 06/30/2020   AST 18 06/30/2020   ALKPHOS 59 10/16/2018   BILITOT 0.4 06/30/2020   Lab Results  Component Value Date   HGBA1C 5.4 06/30/2020   HGBA1C 5.7 (H) 05/16/2020   HGBA1C 5.5 03/15/2017   HGBA1C 5.7 (H) 09/09/2015   HGBA1C 5.7 (H) 08/20/2013   Lab Results  Component Value Date   INSULIN 6.2 07/07/2020   INSULIN 4 09/16/2015   Lab Results  Component Value Date   TSH 0.60 06/30/2020   Lab Results  Component Value Date   CHOL 174 06/30/2020   HDL 65 06/30/2020   LDLCALC 95 06/30/2020   TRIG 48 06/30/2020   CHOLHDL 2.7 06/30/2020   Lab Results  Component Value Date   VD25OH 18.9 (L) 07/07/2020   VD25OH 41 10/02/2018   VD25OH 27 (L) 03/17/2018   Lab Results  Component Value Date   WBC 3.0 (L) 06/30/2020   HGB 11.7 06/30/2020   HCT 36.3 06/30/2020   MCV 98.9 06/30/2020   PLT 170  06/30/2020   Lab Results  Component Value Date   IRON 124 04/28/2020   TIBC 293 04/28/2020   FERRITIN 42 04/28/2020   Attestation Statements:   Reviewed by clinician on day of visit: allergies, medications, problem list, medical history, surgical history, family history, social history, and previous encounter notes.   Wilhemena Durie, am acting as Location manager for [employer], DO.  I have reviewed the above documentation for accuracy and completeness, and I agree with the above. Jearld Lesch, DO

## 2020-08-29 ENCOUNTER — Other Ambulatory Visit (HOSPITAL_COMMUNITY): Payer: Self-pay

## 2020-08-29 NOTE — Progress Notes (Signed)
Subjective:    Patient ID: Gevena [name], female    DOB: 1960/10/17, 60 y.o.   MRN: [id number]  HPI 60 year old Female seen for health maintenance exam and evaluation of medical issues.  In October 2021 she had left knee polyliner exchange and right knee arthroscopy with partial medial meniscectomy by Dr. Ninfa Linden.  It has taken for some time to improve.  Continues to have right knee pain.  Has been to rehab for this.  Has had injection of right knee in January with Monovisc.  Had failed steroid injections.  Saw Dr. Jaynee Eagles in March 2022 with lumbar radiculopathy  Had issues with palpitations in February 2022.  In the remote past Dr. Radford Pax saw her for chest pain and nuclear stress test showed low risk study and a very small anterior perfusion defect that was thought to be due to breast attenuation artifact.  She had gastric bypass surgery in 2016.  In 2018 she saw Dr. Radford Pax for shortness of breath and dyspnea on exertion.  2D echocardiogram was normal and exercise tolerance test showed no ischemia.  GYN noticed that heartbeat was in the 40s in early 2022.  Monitor showed PACs and atrial tachycardia.  There were PVCs and 5 beat runs of nonsustained ventricular tachycardia.  Coronary CTA was normal.  Was started on Toprol-XL 25 mg daily.  Advised not to drink caffeine or alcohol.  Thyroid functions were normal.  Saw Dr. Jaynee Eagles for lumbar radiculopathy and had epidural steroids at [facility].  Attends [employer] [facility].  History of history of hidradenitis seen by [facility].  This is treated with Humira.  Dr. Herbert Deaner is ophthalmologist.  In 2019 she had an acute medial meniscal tear and underwent left knee arthroscopic surgery with meniscal debridement.  In November, she underwent total left knee arthroplasty.  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  In March 2019 she had MRI of the brain  for headaches.  Headaches could last up to 3 days per episode and associated with some vertigo.  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  Headaches improved with conservative treatment.  In December 2019 she presented to the emergency department with diarrhea and volume depletion.  This was treated with IV fluids and improved.  She had a carbuncle in perianal area excised by Dr. Marcello Moores in August 2018.  Pathology showed this to be ruptured and inflamed epidermal inclusion cyst.  Had colonoscopy by Dr. Leana Roe in February 2019 with 10-year follow-up recommended.  History of B12 deficiency and gives herself monthly B12 injections.  She had thyroidectomy in 1980 and is on thyroid replacement therapy.  Had cholecystectomy 1987.  Remote history of adenomatous colon polyp.  C-section for twin daughters in 79.  Bilateral tubal ligation in the 1980s.  Total abdominal hysterectomy 2004.  Left hip arthroplasty December 2010.  Right hip arthroplasty March 2014.  Endoscopy for epigastric pain 2012 by Dr. Olevia Perches.  Upper endoscopy in 2012 showed chronic duodenitis and she is treated with PPI.  History of GE reflux.  Has had bone density study ordered by GYN physician which was normal.  She saw Dr. Radford Pax in 2015 for chest pain.  2D echocardiogram and myocardial perfusion imaging were normal.  In March 2018 she had exercise tolerance test which was normal.  Social history: First marriage ended in divorce.  She remarried.  Husband has back issues and is disabled.  Patient does not smoke or consume alcohol.  She has 2 adult twin daughters.  1 daughter resides in [place].  Another daughter resides here locally and is a Education officer, museum.  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  Family history: Mother living with history of diabetes and hypertension.  Father deceased with history of diabetes.  2 brothers and 1 sister.  Sister has  hypothyroidism.    Review of Systems see above.  Generally does not feel very well with low energy and knee pain.     Objective:   Physical Exam Blood pressure 110/70 pulse 58 pulse oximetry 99% weight 258 pounds height 5 feet 5.5 inches BMI 42.28.  Weight in November 2020 was 244 pounds here at this office.  Skin: Warm and dry.  Nodes: None.  TMs clear.  Neck supple.  No thyromegaly.  Chest is clear to auscultation.  Cardiac exam: Bradycardia regular rate and rhythm.  Abdomen soft nondistended without hepatosplenomegaly masses or tenderness.  GYN exam deferred to Dr. Talbert Nan.  No lower extremity pitting edema.  Neuro: Intact without focal deficits.       Assessment & Plan:    History of hidradenitis suppurativa treated by dermatologist with DMARD  Hypothyroidism status post thyroidectomy.  Is on thyroid replacement and TSH is stable.  History of B12 deficiency-takes monthly B12 injections  History of headaches evaluated by Dr. Jaynee Eagles with partial empty sella syndrome noted on imaging  GE reflux treated with PPI.  Status post gastric bypass surgery by Dr. Hassell Done.  Musculoskeletal pain in knees.  Has currently been treated with gabapentin and small quantities of hydrocodone APAP.  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  Anxiety and insomnia treated with Xanax.  History of cardiac dysrhythmia treated with metoprolol by Dr. Radford Pax.  Had PVCs and runs of atrial tachycardia.  Plan: For now she will continue with current medications and return in 6 months.

## 2020-08-29 NOTE — Patient Instructions (Addendum)
Continue with conservative treatment for musculoskeletal pain with gabapentin and occasional hydrocodone APAP.  Continue close follow-up with Dr. Radford Pax for cardiac issues.  Continue DMARD therapy for hidradenitis.  Continue PPI for reflux.  Continue B12 injections monthly at home.  Return in 6 months.  Try Zanaflex for musculoskeletal pain.

## 2020-08-30 ENCOUNTER — Other Ambulatory Visit (HOSPITAL_COMMUNITY): Payer: Self-pay

## 2020-08-31 ENCOUNTER — Other Ambulatory Visit: Payer: Self-pay | Admitting: Internal Medicine

## 2020-08-31 MED FILL — Promethazine HCl Tab 25 MG: ORAL | 10 days supply | Qty: 30 | Fill #0 | Status: AC

## 2020-09-01 ENCOUNTER — Other Ambulatory Visit (HOSPITAL_COMMUNITY): Payer: Self-pay

## 2020-09-01 MED ORDER — ALPRAZOLAM 1 MG PO TABS
ORAL_TABLET | Freq: Every day | ORAL | 0 refills | Status: DC
  Filled 2020-09-01: qty 90, 90d supply, fill #0

## 2020-09-07 ENCOUNTER — Other Ambulatory Visit (HOSPITAL_COMMUNITY): Payer: Self-pay

## 2020-09-07 ENCOUNTER — Encounter: Payer: Self-pay | Admitting: Family

## 2020-09-07 MED FILL — Adalimumab Auto-injector Kit 40 MG/0.8ML: SUBCUTANEOUS | 28 days supply | Qty: 4 | Fill #3 | Status: AC

## 2020-09-10 ENCOUNTER — Other Ambulatory Visit (HOSPITAL_COMMUNITY): Payer: Self-pay

## 2020-09-10 ENCOUNTER — Other Ambulatory Visit: Payer: Self-pay | Admitting: Internal Medicine

## 2020-09-10 MED ORDER — TIZANIDINE HCL 4 MG PO CAPS
4.0000 mg | ORAL_CAPSULE | Freq: Three times a day (TID) | ORAL | 1 refills | Status: DC
  Filled 2020-09-10: qty 30, 10d supply, fill #0
  Filled 2020-10-27: qty 30, 10d supply, fill #1

## 2020-09-13 ENCOUNTER — Other Ambulatory Visit (HOSPITAL_COMMUNITY): Payer: Self-pay

## 2020-09-22 ENCOUNTER — Other Ambulatory Visit (HOSPITAL_COMMUNITY): Payer: Self-pay

## 2020-09-22 MED FILL — Rimegepant Sulfate Tab Disint 75 MG: ORAL | 30 days supply | Qty: 8 | Fill #2 | Status: AC

## 2020-09-27 ENCOUNTER — Telehealth: Payer: Self-pay | Admitting: *Deleted

## 2020-09-27 NOTE — Telephone Encounter (Signed)
Called patient to reschedule appointment with Judson Roch - confirmed - mailed updated calendar

## 2020-09-28 ENCOUNTER — Encounter: Payer: Self-pay | Admitting: Orthopaedic Surgery

## 2020-09-29 ENCOUNTER — Other Ambulatory Visit (HOSPITAL_COMMUNITY): Payer: Self-pay

## 2020-09-29 MED FILL — Adalimumab Auto-injector Kit 40 MG/0.8ML: SUBCUTANEOUS | 28 days supply | Qty: 4 | Fill #4 | Status: AC

## 2020-10-04 ENCOUNTER — Other Ambulatory Visit (HOSPITAL_COMMUNITY): Payer: Self-pay

## 2020-10-04 ENCOUNTER — Encounter: Payer: Self-pay | Admitting: Family

## 2020-10-04 ENCOUNTER — Telehealth: Payer: Self-pay

## 2020-10-04 NOTE — Telephone Encounter (Signed)
VOB submitted for Monovisc, right knee. Pending BV. 

## 2020-10-04 NOTE — Telephone Encounter (Signed)
Noted  

## 2020-10-05 ENCOUNTER — Telehealth: Payer: Self-pay

## 2020-10-05 NOTE — Telephone Encounter (Signed)
PA required for Monovisc, right knee. Started PA with Victoria [name] through [facility], per Victoria [name], office notes need to be faxed to [phone]. Reference#[email or web address] 08, [email or web address]  Faxed office notes to [facility], attn: CM Coordinator at [phone].

## 2020-10-11 ENCOUNTER — Encounter: Payer: Self-pay | Admitting: Orthopaedic Surgery

## 2020-10-24 ENCOUNTER — Telehealth: Payer: Self-pay | Admitting: *Deleted

## 2020-10-24 ENCOUNTER — Inpatient Hospital Stay: Payer: No Typology Code available for payment source | Attending: Hematology & Oncology

## 2020-10-24 ENCOUNTER — Encounter: Payer: Self-pay | Admitting: Family

## 2020-10-24 ENCOUNTER — Other Ambulatory Visit: Payer: Self-pay

## 2020-10-24 ENCOUNTER — Inpatient Hospital Stay: Payer: No Typology Code available for payment source | Admitting: Family

## 2020-10-24 VITALS — BP 141/77 | HR 54 | Temp 98.1°F | Resp 18 | Wt 270.0 lb

## 2020-10-24 DIAGNOSIS — D508 Other iron deficiency anemias: Secondary | ICD-10-CM | POA: Diagnosis not present

## 2020-10-24 DIAGNOSIS — K909 Intestinal malabsorption, unspecified: Secondary | ICD-10-CM

## 2020-10-24 DIAGNOSIS — D509 Iron deficiency anemia, unspecified: Secondary | ICD-10-CM | POA: Insufficient documentation

## 2020-10-24 LAB — CBC WITH DIFFERENTIAL (CANCER CENTER ONLY)
Abs Immature Granulocytes: 0.01 10*3/uL (ref 0.00–0.07)
Basophils Absolute: 0 10*3/uL (ref 0.0–0.1)
Basophils Relative: 1 %
Eosinophils Absolute: 0.1 10*3/uL (ref 0.0–0.5)
Eosinophils Relative: 4 %
HCT: 35.5 % — ABNORMAL LOW (ref 36.0–46.0)
Hemoglobin: 11.8 g/dL — ABNORMAL LOW (ref 12.0–15.0)
Immature Granulocytes: 0 %
Lymphocytes Relative: 45 %
Lymphs Abs: 1.5 10*3/uL (ref 0.7–4.0)
MCH: 32.9 pg (ref 26.0–34.0)
MCHC: 33.2 g/dL (ref 30.0–36.0)
MCV: 98.9 fL (ref 80.0–100.0)
Monocytes Absolute: 0.4 10*3/uL (ref 0.1–1.0)
Monocytes Relative: 11 %
Neutro Abs: 1.3 10*3/uL — ABNORMAL LOW (ref 1.7–7.7)
Neutrophils Relative %: 39 %
Platelet Count: 193 10*3/uL (ref 150–400)
RBC: 3.59 MIL/uL — ABNORMAL LOW (ref 3.87–5.11)
RDW: 13.6 % (ref 11.5–15.5)
WBC Count: 3.3 10*3/uL — ABNORMAL LOW (ref 4.0–10.5)
nRBC: 0 % (ref 0.0–0.2)

## 2020-10-24 LAB — RETICULOCYTES
Immature Retic Fract: 8.8 % (ref 2.3–15.9)
RBC.: 3.55 MIL/uL — ABNORMAL LOW (ref 3.87–5.11)
Retic Count, Absolute: 50.4 10*3/uL (ref 19.0–186.0)
Retic Ct Pct: 1.4 % (ref 0.4–3.1)

## 2020-10-24 NOTE — Progress Notes (Signed)
Hematology and Oncology Follow Up Visit  Victoria [name] YD:[id number] 1960/05/12 60 y.o. 10/24/2020   Principle Diagnosis:  Iron deficiency anemia secondary to malabsorption, s/p gastric bypass in 2016    Current Therapy:        IV iron as indicated    Interim History:  Victoria [name] is here today for follow-up. She is symptomatic with fatigue.  She has pain in her knees and states that she is wanting to avoid surgery right now. She states that she will have another gel injection in the right knee.  No falls or syncope to report. Pedal pulses are 2+. She has puffiness in her ankles at times.  Numbness and tingling in the hands and feet is unchanged from baseline.  She denies fever, chills, cough, SOB, chest pain, abdominal pain or changes in bowel or bladder habits.  No blood loss noted, no petechiae. She bruises easily but not in excess.  She has nausea occasionally after eating. No vomiting.  She had a rash effecting her left arm and leg that was treated effectively with Atarax. No other episodes since then.  She states that her palpitations have improved since starting Toprol-XL.  She has a good appetite and is doing her best to stay properly hydrated. Her weight is 270 lbs.   ECOG Performance Status: 1 - Symptomatic but completely ambulatory  Medications:  Allergies as of 10/24/2020       Reactions   Other Other (See Comments)   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   Estradiol Rash, Other (See Comments)   Local rash with estradiol patches        Medication List        Accurate as of October 24, 2020  1:14 PM. If you have any questions, ask your nurse or doctor.          ALPRAZolam 1 MG tablet Commonly known as: XANAX Take one tablet by mouth at bedtime.   CALCIUM + D3 PO Take 1 tablet by mouth daily.   celecoxib 200 MG capsule Commonly known as: CELEBREX TAKE 1 CAPSULE BY MOUTH 2 TIMES DAILY BETWEEN MEALS AS NEEDED.    cyanocobalamin 1000 MCG/ML injection Commonly known as: (VITAMIN B-12) INJECT 1 ML INTRAMUSCULARLY EVERY MONTH   Divigel 1 MG/GM Gel Generic drug: Estradiol APPLY TO THE SKIN AT BEDTIME   doxepin 150 MG capsule Commonly known as: SINEQUAN Take 1 capsule (150 mg total) by mouth at bedtime.   famotidine 40 MG tablet Commonly known as: PEPCID Take 1 tablet by mouth twice a day   flecainide 50 MG tablet Commonly known as: TAMBOCOR Take 1 tablet (50 mg total) by mouth 2 (two) times daily.   Fluocinolone Acetonide Scalp 0.01 % Oil Apply to scalp twice daily for 2 weeks, stop for 1 week then repeat   gabapentin 600 MG tablet Commonly known as: NEURONTIN TAKE 1 TABLET BY MOUTH 3 TIMES DAILY   Humira Pen 40 MG/0.8ML Pnkt Generic drug: Adalimumab INJECT ONE '40MG'$ /0.8ML PEN UNDER THE SKIN EACH WEEK.   hydrocortisone 25 MG suppository Commonly known as: ANUSOL-HC Insert one suppository vaginal at bedtime nightly for 6 weeks.   hydrOXYzine 10 MG tablet Commonly known as: ATARAX/VISTARIL Take 1 to 3 tablets by mouth at bedtime as needed for itching   levocetirizine 5 MG tablet Commonly known as: XYZAL Take 1 tablet by mouth every evening   levothyroxine 112 MCG tablet Commonly known as: SYNTHROID TAKE 1 TABLET BY MOUTH ONCE  A DAY   metoprolol succinate 25 MG 24 hr tablet Commonly known as: TOPROL-XL TAKE 1 TABLET (25 MG TOTAL) BY MOUTH DAILY.   multivitamin with minerals Tabs tablet Take 1 tablet by mouth daily.   Nurtec 75 MG Tbdp Generic drug: Rimegepant Sulfate DISSOLVE 1 TABLET BY MOUTH DAILY AS NEEDED FOR MIGRAINES. TAKE AS CLOSE TO ONSET OF MIGRAINE AS POSSIBLE. ONE DAILY MAXIMUM.   pantoprazole 40 MG tablet Commonly known as: PROTONIX TAKE 1 TABLET BY MOUTH TWICE DAILY--- PRN   polyethylene glycol 17 g packet Commonly known as: MIRALAX / GLYCOLAX Take 17 g by mouth daily as needed for mild constipation.   promethazine 25 MG tablet Commonly known as:  PHENERGAN TAKE 1 TABLET BY MOUTH EVERY 8 HOURS AS NEEDED FOR NAUSEA AND/OR VOMITING   sulfamethoxazole-trimethoprim 800-160 MG tablet Commonly known as: BACTRIM DS Take 1 Tablet by mouth twice daily for 1 month   tiZANidine 4 MG capsule Commonly known as: Zanaflex Take 1 capsule (4 mg total) by mouth 3 (three) times daily.   triamcinolone cream 0.1 % Commonly known as: KENALOG Apply topically 4 (four) times daily as directed.   triamcinolone cream 0.1 % Commonly known as: KENALOG Apply to affected areas twice daily for 2-3 weeks as needed for flare ups.   valACYclovir 500 MG tablet Commonly known as: VALTREX TAKE 1 TABLET BY MOUTH TWICE DAILY AS NEEDED   Vitamin D (Ergocalciferol) 1.25 MG (50000 UNIT) Caps capsule Commonly known as: DRISDOL Take 1 capsule by mouth every 7 days.        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  Review of Systems: All other 10 point review of systems is negative.   Physical Exam:  vitals were not taken for this visit.   Wt Readings from Last 3 Encounters:  08/22/20 251 lb (113.9 kg)  08/04/20 246 lb (111.6 kg)  08/04/20 246 lb (111.6 kg)    Ocular: Sclerae unicteric, pupils equal, round and reactive to light Ear-nose-throat: Oropharynx clear, dentition fair Lymphatic: No cervical or supraclavicular adenopathy Lungs no rales or rhonchi, good excursion bilaterally Heart regular rate and rhythm, no murmur appreciated Abd soft, nontender, positive bowel sounds MSK no focal spinal tenderness, no joint edema Neuro: non-focal, well-oriented, appropriate affect Breasts: Deferred   Lab Results  Component Value Date   WBC 3.0 (L) 06/30/2020   HGB 11.7  06/30/2020   HCT 36.3 06/30/2020   MCV 98.9 06/30/2020   PLT 170 06/30/2020   Lab Results  Component Value Date   FERRITIN 42 04/28/2020   IRON 124 04/28/2020   TIBC 293 04/28/2020   UIBC 169 04/28/2020   IRONPCTSAT 42 04/28/2020   Lab Results  Component Value Date   RETICCTPCT 1.2 04/28/2020   RBC 3.67 (L) 06/30/2020   RETICCTABS 36,500 10/02/2018   No results found for: KPAFRELGTCHN, LAMBDASER, KAPLAMBRATIO Lab Results  Component Value Date   IGGSERUM 1,609 (H) 05/16/2020   IGMSERUM 106 05/16/2020   No results found for: Ronnald Ramp, A1GS, A2GS, Violet Baldy, MSPIKE, SPEI   Chemistry      Component Value Date/Time   NA 140 06/30/2020 1105   K 4.3 06/30/2020 1105   CL 106 06/30/2020  1105   CO2 28 06/30/2020 1105   BUN 14 06/30/2020 1105   CREATININE 0.71 06/30/2020 1105      Component Value Date/Time   CALCIUM 9.0 06/30/2020 1105   ALKPHOS 59 10/16/2018 0903   AST 18 06/30/2020 1105   AST 26 10/16/2018 0903   ALT 18 06/30/2020 1105   ALT 26 10/16/2018 0903   BILITOT 0.4 06/30/2020 1105   BILITOT 0.5 10/16/2018 [id number]       Impression and Plan: Victoria [name] is a very pleasant 60 yo African American female with iron deficiency anemia secondary to malabsorption since laparoscopic gastric bypass in 2016.  Iron studies are pending. We will replace if needed.  Follow-up in 4 months.  She can contact our office with any questions or concerns.   Laverna Peace, NP 8/29/20221:14 PM

## 2020-10-24 NOTE — Telephone Encounter (Signed)
Per 10/24/20 los - gave upcoming appointments - confirmed

## 2020-10-25 ENCOUNTER — Ambulatory Visit: Payer: No Typology Code available for payment source | Admitting: Family

## 2020-10-25 ENCOUNTER — Other Ambulatory Visit: Payer: No Typology Code available for payment source

## 2020-10-25 LAB — IRON AND TIBC
Iron: 105 ug/dL (ref 41–142)
Saturation Ratios: 32 % (ref 21–57)
TIBC: 330 ug/dL (ref 236–444)
UIBC: 225 ug/dL (ref 120–384)

## 2020-10-25 LAB — FERRITIN: Ferritin: 19 ng/mL (ref 11–307)

## 2020-10-27 ENCOUNTER — Other Ambulatory Visit: Payer: Self-pay | Admitting: Internal Medicine

## 2020-10-27 ENCOUNTER — Other Ambulatory Visit (HOSPITAL_COMMUNITY): Payer: Self-pay

## 2020-10-27 ENCOUNTER — Ambulatory Visit: Payer: No Typology Code available for payment source | Admitting: Family

## 2020-10-27 ENCOUNTER — Other Ambulatory Visit: Payer: No Typology Code available for payment source

## 2020-10-27 MED FILL — Celecoxib Cap 200 MG: ORAL | 30 days supply | Qty: 60 | Fill #0 | Status: AC

## 2020-10-28 ENCOUNTER — Other Ambulatory Visit (HOSPITAL_COMMUNITY): Payer: Self-pay

## 2020-10-28 ENCOUNTER — Ambulatory Visit: Payer: No Typology Code available for payment source | Admitting: Family

## 2020-10-28 ENCOUNTER — Telehealth: Payer: Self-pay

## 2020-10-28 ENCOUNTER — Other Ambulatory Visit: Payer: No Typology Code available for payment source

## 2020-10-28 MED ORDER — HUMIRA PEN 40 MG/0.8ML ~~LOC~~ PNKT: PEN_INJECTOR | SUBCUTANEOUS | 5 refills | Status: DC

## 2020-10-28 MED ORDER — GABAPENTIN 600 MG PO TABS
ORAL_TABLET | Freq: Three times a day (TID) | ORAL | 5 refills | Status: DC
  Filled 2020-10-28: qty 90, 30d supply, fill #0
  Filled 2021-01-04: qty 90, 30d supply, fill #1
  Filled 2021-02-03: qty 90, 30d supply, fill #2
  Filled 2021-06-14: qty 90, 30d supply, fill #3
  Filled 2021-08-27: qty 90, 30d supply, fill #4

## 2020-10-28 NOTE — Telephone Encounter (Signed)
PA was denied for Monovisc, right knee. Patient also has [facility], which recommends that the gel injection is not medically necessary and currently not covering any gel injections at this time.  Patient may try TriVisc, which is a series of 3 and the patient will pay $250.00 OOP.

## 2020-11-01 ENCOUNTER — Other Ambulatory Visit (HOSPITAL_COMMUNITY): Payer: Self-pay

## 2020-11-03 ENCOUNTER — Other Ambulatory Visit: Payer: Self-pay | Admitting: Pharmacist

## 2020-11-03 ENCOUNTER — Other Ambulatory Visit (HOSPITAL_COMMUNITY): Payer: Self-pay

## 2020-11-03 MED ORDER — HUMIRA PEN 40 MG/0.8ML ~~LOC~~ PNKT
PEN_INJECTOR | SUBCUTANEOUS | 5 refills | Status: DC
  Filled 2020-11-03: qty 4, 28d supply, fill #0
  Filled 2020-11-28: qty 4, 28d supply, fill #1
  Filled 2020-12-29: qty 4, 28d supply, fill #2
  Filled 2021-01-23: qty 4, 28d supply, fill #3
  Filled 2021-02-16: qty 4, 28d supply, fill #4
  Filled 2021-03-20: qty 4, 28d supply, fill #5

## 2020-11-14 ENCOUNTER — Other Ambulatory Visit (HOSPITAL_COMMUNITY): Payer: Self-pay

## 2020-11-21 ENCOUNTER — Encounter: Payer: Self-pay | Admitting: Obstetrics and Gynecology

## 2020-11-22 ENCOUNTER — Other Ambulatory Visit (HOSPITAL_COMMUNITY): Payer: Self-pay

## 2020-11-22 ENCOUNTER — Other Ambulatory Visit: Payer: Self-pay | Admitting: Internal Medicine

## 2020-11-22 MED ORDER — TIZANIDINE HCL 4 MG PO CAPS
4.0000 mg | ORAL_CAPSULE | Freq: Three times a day (TID) | ORAL | 1 refills | Status: DC
  Filled 2020-11-22: qty 30, 10d supply, fill #0
  Filled 2021-01-04: qty 30, 10d supply, fill #1

## 2020-11-23 ENCOUNTER — Other Ambulatory Visit (HOSPITAL_COMMUNITY): Payer: Self-pay

## 2020-11-24 ENCOUNTER — Other Ambulatory Visit: Payer: Self-pay

## 2020-11-24 ENCOUNTER — Encounter: Payer: Self-pay | Admitting: Obstetrics and Gynecology

## 2020-11-24 ENCOUNTER — Other Ambulatory Visit (HOSPITAL_COMMUNITY): Payer: Self-pay

## 2020-11-24 ENCOUNTER — Ambulatory Visit: Payer: No Typology Code available for payment source | Admitting: Obstetrics and Gynecology

## 2020-11-24 VITALS — BP 116/72 | HR 88 | Ht 67.0 in | Wt 261.0 lb

## 2020-11-24 DIAGNOSIS — Z5181 Encounter for therapeutic drug level monitoring: Secondary | ICD-10-CM | POA: Diagnosis not present

## 2020-11-24 DIAGNOSIS — N951 Menopausal and female climacteric states: Secondary | ICD-10-CM | POA: Diagnosis not present

## 2020-11-24 DIAGNOSIS — B373 Candidiasis of vulva and vagina: Secondary | ICD-10-CM

## 2020-11-24 DIAGNOSIS — B3731 Acute candidiasis of vulva and vagina: Secondary | ICD-10-CM

## 2020-11-24 DIAGNOSIS — N898 Other specified noninflammatory disorders of vagina: Secondary | ICD-10-CM | POA: Diagnosis not present

## 2020-11-24 DIAGNOSIS — Z79890 Hormone replacement therapy (postmenopausal): Secondary | ICD-10-CM

## 2020-11-24 LAB — WET PREP FOR TRICH, YEAST, CLUE

## 2020-11-24 MED ORDER — FLUCONAZOLE 150 MG PO TABS
150.0000 mg | ORAL_TABLET | Freq: Once | ORAL | 0 refills | Status: AC
  Filled 2020-11-24: qty 2, 3d supply, fill #0

## 2020-11-24 MED ORDER — DIVIGEL 1.25 MG/1.25GM TD GEL
TRANSDERMAL | 2 refills | Status: DC
Start: 2020-11-24 — End: 2021-07-12
  Filled 2020-11-24: qty 112.5, 90d supply, fill #0
  Filled 2020-12-13 – 2020-12-27 (×2): qty 37.5, 30d supply, fill #0
  Filled 2020-12-28: qty 112.5, 90d supply, fill #0
  Filled 2021-04-20: qty 112.5, 90d supply, fill #1

## 2020-11-24 MED FILL — Rimegepant Sulfate Tab Disint 75 MG: ORAL | 30 days supply | Qty: 8 | Fill #3 | Status: AC

## 2020-11-24 NOTE — Progress Notes (Signed)
GYNECOLOGY  VISIT   HPI: 60 y.o.   Married Black or [place] American Not Hispanic or Latino  female   [phone] with No LMP recorded. Patient has had a hysterectomy.   here for vaginal discharge. She has a large amount of vaginal discharge, white, no odor. H/O DIV, has been treated with steroids and then clindamycin.  She is on divigel, hot flashes have gotten worse. She has added black cohosh which is helping more. The hot flashes are the worst in the am, but they occur all day. Night sweats aren't as bad as the hot flashes. She sleeps with a fan on her at night. She is waking up 4-5 x a night (multiple reasons she is waking up).  She developed a rash with the estrogen patch.    GYNECOLOGIC HISTORY: No LMP recorded. Patient has had a hysterectomy. Contraception:hysterectomy  Menopausal hormone therapy: estradiol gel        OB History     Gravida  2   Para  1   Term      Preterm  1   AB  1   Living  2      SAB      IAB  1   Ectopic      Multiple  1   Live Births  2              Patient Active Problem List   Diagnosis Date Noted   Spinal stenosis of lumbar region 07/07/2020   Unilateral primary osteoarthritis, right knee 06/08/2020   Displacement of lumbar intervertebral disc 06/02/2020   Lumbar spondylosis 06/02/2020   Lumbar stenosis with neurogenic claudication 06/02/2020   Arm numbness 05/30/2020   Chronic neck pain 05/30/2020   Other chronic pain 05/30/2020   Elevated blood-pressure reading, without diagnosis of hypertension 05/30/2020   Status post arthroscopy of right knee 12/11/2019   Polyethylene wear of left knee joint prosthesis ([place]) 09/09/2019   Acute medial meniscal tear, right, subsequent encounter 08/27/2019   History of total left knee replacement 07/16/2019   Peroneal neuropathy at knee, left 06/25/2019   Lumbosacral radiculopathy 06/07/2019   Iron malabsorption 10/16/2018   IDA (iron deficiency anemia) 10/16/2018   OA (osteoarthritis)  of knee 01/20/2018   Acute meniscal tear, medial 07/17/2017   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   Failed total hip arthroplasty, 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/2017   Insomnia 11/08/2016   Pernicious anemia 11/08/2016   Leukopenia 07/09/2016   Hypocupremia 07/09/2016   Palpitations 04/12/2016   Reactive hypoglycemia 10/31/2015   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 Renold Genta now   Constipation    Degenerative joint disease of low back 09/02/15    Dr. Maureen Ralphs   Edema of both lower legs    Fibrocystic breast    Gallbladder problem    GERD (gastroesophageal reflux disease)    Headache    per pt more stress/tension   Heart palpitations    SEE EPIC ENCOUTNER , CARDIOLOGY DR. Tressia Miners TURNER 2018; reports  on 05-16-17 " i haven't had any bouts of those lately"     Hemorrhoids    History of Clostridium difficile infection 12/2010   History of exercise intolerance    ETT on 04-27-2016-- negative ([facility] treadmill score 7)   History of Helicobacter pylori infection 2001 and 09/ 2012   HSV-2 infection    genital   Hx of adenomatous colonic polyps 10/17/2007   Hydradenitis    per pt currently treated with Humira injection   Hypothyroidism, postsurgical 1980   IBS (irritable bowel syndrome)    Joint pain    Medial meniscus tear    right knee   Osteoarthritis    Palpitations    Pernicious anemia    b12 def   PONV (postoperative nausea and vomiting)    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    Prediabetes    Reactive hypoglycemia followed by pcp   post gastrectomy dumping syndrome   SOB (shortness of breath)    Tendonitis    right shoulder   Varicose veins    Vitamin B 12 deficiency    Vitamin D deficiency     Past Surgical History:  Procedure Laterality Date   BREATH TEK H PYLORI N/A 05/03/2014   Procedure: BREATH TEK H PYLORI;  Surgeon: Kaylyn Lim, MD;  Location: [facility];  Service: General;  Laterality: N/A;   CARDIOVASCULAR STRESS TEST  03/11/2013   dr Tressia Miners turner   Low risk nuclear study w/ a very small anterior perfusion defect that most likely represents breast attenuation artifact, less likely this could represent a true perfusion abnormality in the distribution of diagonal branch of the LAD/  normal LV function and wall motion , ef 66%   CATARACT EXTRACTION W/ INTRAOCULAR LENS  IMPLANT, BILATERAL  10/2017   [place]   COLONOSCOPY  02/2017   Dr [name] ; per patient they found 7 polyps with polypectomy; on 5 year track    FINE NEEDLE ASPIRATION Left 05/22/2017   Procedure: LEFT KNEE ASPIRATION;  Surgeon: Gaynelle Arabian, MD;  Location: [facility];  Service: Orthopedics;  Laterality: Left;   GASTRIC ROUX-EN-Y N/A 07/06/2014   Procedure: LAPAROSCOPIC ROUX-EN-Y GASTRIC BYPASS, ENTEROLYSIS OF ADHESIONS WITH UPPER ENDOSCOPY;  Surgeon: Johnathan Hausen, MD;  Location: [facility];  Service: General;  Laterality: N/A;   HAMMER TOE SURGERY Bilateral 02/17/2008   bilateral foot digit's 2 and 5   HIATAL HERNIA REPAIR N/A 07/06/2014   Procedure: LAPAROSCOPIC REPAIR OF HIATAL HERNIA;  Surgeon: Johnathan Hausen, MD;  Location: [facility];  Service: General;  Laterality: N/A;   I & D KNEE WITH POLY EXCHANGE Left 12/11/2019   Procedure: LEFT KNEE POLY-LINER EXCHANGE;  Surgeon: Mcarthur Rossetti, MD;  Location: [facility];  Service: Orthopedics;  Laterality: Left;   KNEE ARTHROSCOPY Right 12/11/2019   Procedure: RIGHT KNEE ARTHROSCOPY WITH PARTIAL MEDIAL  MENISCECTOMY;  Surgeon: Mcarthur Rossetti, MD;  Location: [facility];  Service: Orthopedics;  Laterality: Right;   KNEE ARTHROSCOPY WITH MEDIAL MENISECTOMY Left 07/17/2017   Procedure: LEFT KNEE ARTHROSCOPY WITH MEDIAL LATERAL MENISECTOMY;  Surgeon: Gaynelle Arabian, MD;  Location: [facility];  Service: Orthopedics;  Laterality: Left;   MASS EXCISION N/A 10/11/2016   Procedure: EXCISION OF PERIANAL MASS;  Surgeon: [name], MD;  Location: [facility];  Service: General;  Laterality: N/A;   [place]  08-25-2002   dr Jeani Hawking  [place]   ovaries retained   TOTAL HIP ARTHROPLASTY Right 05/21/2012   Procedure: TOTAL HIP ARTHROPLASTY;  Surgeon: Gearlean Alf, MD;  Location: [facility];  Service: Orthopedics;  Laterality: Right;   TOTAL HIP ARTHROPLASTY Left 01-31-2009  dr Wynelle Link   TOTAL HIP REVISION Left 05/22/2017   Procedure: Left hip bearing surface and head revision;  Surgeon: Gaynelle Arabian, MD;  Location: [facility];  Service: Orthopedics;  Laterality: Left;   TOTAL KNEE ARTHROPLASTY Left 01/20/2018   Procedure: LEFT TOTAL KNEE ARTHROPLASTY;  Surgeon: Gaynelle Arabian, MD;  Location: [facility];  Service: Orthopedics;  Laterality: Left;  60min   TRANSTHORACIC ECHOCARDIOGRAM  05/03/2016   ef 55-60%/  trivial MR/  mild TR   TUBAL LIGATION      Current Outpatient Medications  Medication Sig Dispense Refill   Estradiol (DIVIGEL) 1.25 MG/1.25GM GEL Use one packet daily 112.5 g 2   fluconazole (DIFLUCAN) 150 MG tablet Take 1 tablet (150 mg total) by mouth once for 1 dose. May repeat in 72 hours if symptoms are not completely resolved. 2 tablet 0   Adalimumab (HUMIRA PEN) 40 MG/0.8ML PNKT Inject 1 pen subcutaneously every week 4 each 5   ALPRAZolam (XANAX) 1 MG tablet Take one tablet by mouth at bedtime. 90 tablet 0   Calcium Carb-Cholecalciferol (CALCIUM + D3 PO) Take 1 tablet by mouth daily.     celecoxib (CELEBREX) 200 MG capsule TAKE  1 CAPSULE BY MOUTH 2 TIMES DAILY BETWEEN MEALS AS NEEDED. 60 capsule 1   cyanocobalamin (,VITAMIN B-12,) 1000 MCG/ML injection INJECT 1 ML INTRAMUSCULARLY EVERY MONTH 3 mL 11   doxepin (SINEQUAN) 150 MG capsule Take 1 capsule (150 mg total) by mouth at bedtime. 30 capsule 0   famotidine (PEPCID) 40 MG tablet Take 1 tablet by mouth twice a day (Patient not taking: Reported on 10/24/2020) 60 tablet 2   flecainide (TAMBOCOR) 50 MG tablet Take 1 tablet (50 mg total) by mouth 2 (two) times daily. 180 tablet 1   Fluocinolone Acetonide Scalp 0.01 % OIL Apply to scalp twice daily for 2 weeks, stop for 1 week then repeat 118.28 mL 2   gabapentin (NEURONTIN) 600 MG tablet TAKE 1 TABLET BY MOUTH 3 TIMES DAILY 90 tablet 5   HYDROcodone-acetaminophen (NORCO) 10-325 MG tablet Take 1 tablet by mouth every 8 (eight) hours as needed.     hydrocortisone (ANUSOL-HC) 25 MG suppository Insert one suppository vaginal at bedtime nightly for 6 weeks. (Patient not taking: Reported on 10/24/2020) 45 suppository 0   HYDROmorphone (DILAUDID) 4 MG tablet Take by mouth every 8 (eight) hours as needed for severe pain.     hydrOXYzine (ATARAX/VISTARIL) 10 MG tablet Take 1 to 3 tablets by mouth at bedtime as needed for itching 90 tablet 1   levocetirizine (XYZAL) 5 MG tablet Take 1 tablet by mouth every evening (Patient not taking: Reported on 10/24/2020) 30 tablet 2   levothyroxine (SYNTHROID) 112 MCG tablet TAKE 1 TABLET BY MOUTH ONCE A DAY 90 tablet 1   metoprolol succinate (TOPROL-XL) 25 MG 24 hr tablet TAKE 1 TABLET (25 MG TOTAL) BY MOUTH DAILY. 90 tablet 1   Multiple Vitamin (MULTIVITAMIN WITH MINERALS) TABS tablet Take 1 tablet by mouth daily.     NURTEC 75 MG TBDP DISSOLVE 1 TABLET BY MOUTH DAILY AS NEEDED FOR MIGRAINES. TAKE AS CLOSE TO ONSET OF MIGRAINE AS POSSIBLE. ONE DAILY MAXIMUM. 8 tablet 11   OxyCODONE HCl, Abuse Deter, (OXAYDO) 5 MG TABA Take 1 tablet  by mouth. PRN for pain     pantoprazole (PROTONIX) 40 MG tablet  TAKE 1 TABLET BY MOUTH TWICE DAILY--- PRN     polyethylene glycol (MIRALAX / GLYCOLAX) packet Take 17 g by mouth daily as needed for mild constipation.     promethazine (PHENERGAN) 25 MG tablet TAKE 1 TABLET BY MOUTH EVERY 8 HOURS AS NEEDED FOR NAUSEA AND/OR VOMITING 30 tablet 5   sulfamethoxazole-trimethoprim (BACTRIM DS) 800-160 MG tablet Take 1 Tablet by mouth twice daily for 1 month 60 tablet 0   tiZANidine (ZANAFLEX) 4 MG capsule Take 1 capsule (4 mg total) by mouth 3 (three) times daily. 30 capsule 1   triamcinolone cream (KENALOG) 0.1 % Apply topically 4 (four) times daily as directed. (Patient not taking: Reported on 10/24/2020) 60 g 1   triamcinolone cream (KENALOG) 0.1 % Apply to affected areas twice daily for 2-3 weeks as needed for flare ups. 80 g 1   valACYclovir (VALTREX) 500 MG tablet TAKE 1 TABLET BY MOUTH TWICE DAILY AS NEEDED (Patient taking differently: TAKE 1 TABLET BY MOUTH TWICE DAILY AS NEEDED) 30 tablet 1   Vitamin D, Ergocalciferol, (DRISDOL) 1.25 MG (50000 UNIT) CAPS capsule Take 1 capsule by mouth every 7 days. 4 capsule 0   No current facility-administered medications for this visit.   Facility-Administered Medications Ordered in Other Visits  Medication Dose Route Frequency Provider Last Rate Last Admin   gadobenate dimeglumine (MULTIHANCE) injection 20 mL  20 mL Intravenous Once PRN Melvenia Beam, MD         ALLERGIES: Other and Estradiol  Family History  Problem Relation Age of Onset   Diabetes Father    High blood pressure Father    High Cholesterol Father    Heart disease Father    Obesity Father    Diabetes Sister    Hypertension Sister    Hypertension Mother    Headache Mother    High Cholesterol Mother    Thyroid disease Mother    Obesity Mother     Social History   Socioeconomic History   Marital status: Married    Spouse name: Barbaraann Rondo   Number of children: 2   Years of education: Not on file   Highest education level: Not on file   Occupational History   Occupation: Therapist, sports  Tobacco Use   Smoking status: Never   Smokeless tobacco: Never  Vaping Use   Vaping Use: Never used  Substance and Sexual Activity   Alcohol use: No   Drug use: No   Sexual activity: Not Currently    Partners: Male    Birth control/protection: Surgical    Comment: hysterectomy  Other Topics Concern   Not on file  Social History Narrative   Lives at home with spouse   Right handed   Caffeine: diet zero mtn dew, occasionally    Social Determinants of Health   Financial Resource Strain: Not on file  Food Insecurity: Not on file  Transportation Needs: Not on file  Physical Activity: Not on file  Stress: Not on file  Social Connections: Not on file  Intimate Partner Violence: Not on file    Review of Systems  All other systems reviewed and are negative.  PHYSICAL EXAMINATION:    BP 116/72   Pulse 88   Ht 5\' 7"  (1.702 m)   Wt 261 lb (118.4 kg)   SpO2 100%   BMI 40.88 kg/m     General appearance: alert, cooperative and appears stated age  Pelvic: External genitalia:  no lesions              Urethra:  normal appearing urethra with no masses, tenderness or lesions              Bartholins and Skenes: normal                 Vagina: erythematous appearing vagina with a slight increase in thin, white vaginal d/c              Cervix: absent               Chaperone was present for exam.  1. Vaginal discharge H/o DIV - WET PREP FOR TRICH, YEAST, CLUE: +yeast, no signs of DIV  2. Yeast vaginitis - fluconazole (DIFLUCAN) 150 MG tablet; Take 1 tablet (150 mg total) by mouth once for 1 dose. Take one tablet.  Repeat in 72 hours if symptoms are not completely resolved.  Dispense: 2 tablet; Refill: 0  3. Encounter for monitoring postmenopausal estrogen replacement therapy Not controlled with 1gm divigel, will increase to 1.25 gm - Estradiol (DIVIGEL) 1.25 MG/1.25GM GEL; Use one packet daily  Dispense: 112.5 g; Refill: 2  4. Vasomotor  symptoms due to menopause - Estradiol (DIVIGEL) 1.25 MG/1.25GM GEL; Use one packet daily  Dispense: 112.5 g; Refill: 2

## 2020-11-25 ENCOUNTER — Other Ambulatory Visit (HOSPITAL_COMMUNITY): Payer: Self-pay

## 2020-11-28 ENCOUNTER — Other Ambulatory Visit (HOSPITAL_COMMUNITY): Payer: Self-pay

## 2020-11-29 ENCOUNTER — Encounter: Payer: Self-pay | Admitting: Family

## 2020-11-29 ENCOUNTER — Other Ambulatory Visit (HOSPITAL_COMMUNITY): Payer: Self-pay

## 2020-12-13 ENCOUNTER — Telehealth: Payer: Self-pay | Admitting: Internal Medicine

## 2020-12-13 ENCOUNTER — Ambulatory Visit (INDEPENDENT_AMBULATORY_CARE_PROVIDER_SITE_OTHER): Payer: No Typology Code available for payment source | Admitting: Internal Medicine

## 2020-12-13 ENCOUNTER — Other Ambulatory Visit: Payer: Self-pay | Admitting: Internal Medicine

## 2020-12-13 ENCOUNTER — Other Ambulatory Visit (HOSPITAL_COMMUNITY): Payer: Self-pay

## 2020-12-13 ENCOUNTER — Other Ambulatory Visit: Payer: Self-pay

## 2020-12-13 VITALS — BP 134/78 | HR 55 | Temp 97.8°F | Resp 15 | Ht 67.0 in | Wt 260.0 lb

## 2020-12-13 DIAGNOSIS — B999 Unspecified infectious disease: Secondary | ICD-10-CM

## 2020-12-13 DIAGNOSIS — R519 Headache, unspecified: Secondary | ICD-10-CM

## 2020-12-13 DIAGNOSIS — J0100 Acute maxillary sinusitis, unspecified: Secondary | ICD-10-CM | POA: Diagnosis not present

## 2020-12-13 DIAGNOSIS — B3731 Acute candidiasis of vulva and vagina: Secondary | ICD-10-CM

## 2020-12-13 DIAGNOSIS — R0981 Nasal congestion: Secondary | ICD-10-CM

## 2020-12-13 DIAGNOSIS — E038 Other specified hypothyroidism: Secondary | ICD-10-CM

## 2020-12-13 DIAGNOSIS — E538 Deficiency of other specified B group vitamins: Secondary | ICD-10-CM

## 2020-12-13 MED ORDER — FLUCONAZOLE 150 MG PO TABS
150.0000 mg | ORAL_TABLET | Freq: Once | ORAL | 2 refills | Status: AC
Start: 2020-12-13 — End: 2020-12-14
  Filled 2020-12-13: qty 1, 1d supply, fill #0

## 2020-12-13 MED ORDER — AZITHROMYCIN 250 MG PO TABS
ORAL_TABLET | ORAL | 1 refills | Status: AC
Start: 2020-12-13 — End: 2020-12-18
  Filled 2020-12-13: qty 6, 5d supply, fill #0

## 2020-12-13 MED ORDER — HYDROCODONE-ACETAMINOPHEN 10-325 MG PO TABS
1.0000 | ORAL_TABLET | Freq: Four times a day (QID) | ORAL | 0 refills | Status: AC | PRN
Start: 2020-12-13 — End: 2020-12-18
  Filled 2020-12-13: qty 20, 5d supply, fill #0

## 2020-12-13 MED ORDER — PREDNISONE 10 MG (21) PO TBPK
ORAL_TABLET | ORAL | 0 refills | Status: DC
  Filled 2020-12-13: qty 21, 6d supply, fill #0

## 2020-12-13 MED ORDER — LEVOTHYROXINE SODIUM 112 MCG PO TABS
ORAL_TABLET | Freq: Every day | ORAL | 1 refills | Status: DC
  Filled 2020-12-13: qty 90, 90d supply, fill #0
  Filled 2021-03-08: qty 90, 90d supply, fill #1

## 2020-12-13 NOTE — Progress Notes (Signed)
      Subjective:    Patient ID: Victoria [name], female    DOB: [birth year], 60 y.o.   MRN: [id number]  HPI 60 year old Female complaining of headache, postnasal drip and hoarseness.  Denies sore throat.  Had COVID-19 test today that was negative.  Symptoms started this past weekend.    Review of Systems see above     Objective:   Physical Exam Temperature 97.8 degrees pulse 55 blood pressure 134/78 respiratory rate 15.  Pulse oximetry is 100% on room air.  BMI 40.72  Skin: Warm and dry.  No cervical adenopathy.  TMs are not red.  Neck is supple.  Chest clear to auscultation.  Pharynx is slightly injected.       Assessment & Plan:  Headache related to respiratory infection.  Prescribed Norco 10/325 to take every 6 hours sparingly if needed for pain/headache.  5-day supply.  Take prednisone in tapering course for headache and congestion.  Acute maxillary sinusitis.  Take Zithromax Z-PAK 2 tabs day 1 followed by 1 tab days 2 through 5.  May take Diflucan 150 mg tablet if needed for Candida vaginitis due to antibiotic therapy.  Have ordered respiratory virus panel and COVID-19 PCR test both of which proved to be negative  Hypothyroidism -refill levothyroxine 0.112 mg daily.  TSH will be checked in November.  History of B12 deficiency-gives self B12 injections monthly  Plan: Call if not improved in 7 to 10 days or sooner if worse.  Addendum: COVID-19 PCR and respiratory virus panel testing are both negative.

## 2020-12-13 NOTE — Telephone Encounter (Signed)
scheduled

## 2020-12-13 NOTE — Telephone Encounter (Signed)
Victoria [name] [phone]  [name] called to say she has a really bad headache and throat drainage for the last 2 days, has not done a COVID test. I let her know we would need a COVID test before we could give her an appointment, she said she was at work and could not do one today, let her know you were out of office tomorrow.   Also has back pain for sometime that she would also like to address.  When I let her know we would need COVID test she said she knows and then hung up.

## 2020-12-13 NOTE — Telephone Encounter (Signed)
Victoria [name] called back to say COVID test was Negative. Do you want me to schedule office visit for this afternoon?

## 2020-12-16 LAB — RESPIRATORY VIRUS PANEL

## 2020-12-16 LAB — TIQ-NTM

## 2020-12-16 LAB — SARS-COV-2 RNA,(COVID-19) QUALITATIVE NAAT: SARS CoV2 RNA: NOT DETECTED

## 2020-12-19 ENCOUNTER — Other Ambulatory Visit: Payer: Self-pay

## 2020-12-19 ENCOUNTER — Ambulatory Visit: Payer: No Typology Code available for payment source | Attending: Internal Medicine | Admitting: Pharmacist

## 2020-12-19 DIAGNOSIS — Z79899 Other long term (current) drug therapy: Secondary | ICD-10-CM

## 2020-12-19 NOTE — Progress Notes (Signed)
   S: Patient presents for review of their specialty medication therapy.  Patient is currently taking Humira for hidradenitis suppurativa. Patient is managed by Lennie Odor [facility]) for this.   Adherence: confirms  Efficacy: reports good control with Humira  Dosing:  Maintenance: 40 mg every week   Screening: TB test: completed per patient Hepatitis: completed per patient  Monitoring: S/sx of infection: denies CBC: see below. Monitored by heme/onc S/sx of hypersensitivity: denies  S/sx of malignancy: denies S/sx of heart failure: denies  O:   Lab Results  Component Value Date   WBC 3.3 (L) 10/24/2020   HGB 11.8 (L) 10/24/2020   HCT 35.5 (L) 10/24/2020   MCV 98.9 10/24/2020   PLT 193 10/24/2020      Chemistry      Component Value Date/Time   NA 140 06/30/2020 1105   K 4.3 06/30/2020 1105   CL 106 06/30/2020 1105   CO2 28 06/30/2020 1105   BUN 14 06/30/2020 1105   CREATININE 0.71 06/30/2020 1105      Component Value Date/Time   CALCIUM 9.0 06/30/2020 1105   ALKPHOS 59 10/16/2018 0903   AST 18 06/30/2020 1105   AST 26 10/16/2018 0903   ALT 18 06/30/2020 1105   ALT 26 10/16/2018 0903   BILITOT 0.4 06/30/2020 1105   BILITOT 0.5 10/16/2018 0903      A/P: 1. Medication review: Patient currently on Humira for hidradenitis suppurativa. Reviewed the medication with the patient, including the following: Humira is a TNF blocking agent indicated for ankylosing spondylitis, Crohn's disease, Hidradenitis suppurativa, psoriatic arthritis, plaque psoriasis, ulcerative colitis, and uveitis. Patient educated on purpose, proper use and potential adverse effects of Humira. The most common adverse effects are infections, headache, and injection site reactions. There is the possibility of an increased risk of malignancy but it is not well understood if this increased risk is due to there medication or the disease state. There are rare cases of pancytopenia and  aplastic anemia. No recommendations for any changes at this time.   Benard Halsted, PharmD, [facility] [place], [place] [phone]

## 2020-12-27 ENCOUNTER — Other Ambulatory Visit (HOSPITAL_COMMUNITY): Payer: Self-pay

## 2020-12-27 ENCOUNTER — Telehealth: Payer: Self-pay | Admitting: *Deleted

## 2020-12-27 NOTE — Telephone Encounter (Signed)
Patient called stating her Divigel 1.25 mg tablet needs PA, I called [place] to see and was told it appears everything looks fine and no PA needed. The brand Divigel is $31.59 for 1 month supply, I called patient and informed her of this as well.

## 2020-12-28 ENCOUNTER — Other Ambulatory Visit (HOSPITAL_COMMUNITY): Payer: Self-pay

## 2020-12-28 ENCOUNTER — Encounter: Payer: Self-pay | Admitting: Family

## 2020-12-29 ENCOUNTER — Other Ambulatory Visit: Payer: Self-pay

## 2020-12-29 ENCOUNTER — Encounter: Payer: Self-pay | Admitting: Family

## 2020-12-29 ENCOUNTER — Other Ambulatory Visit (HOSPITAL_COMMUNITY): Payer: Self-pay

## 2020-12-29 ENCOUNTER — Other Ambulatory Visit: Payer: No Typology Code available for payment source | Admitting: Internal Medicine

## 2020-12-29 DIAGNOSIS — Z9189 Other specified personal risk factors, not elsewhere classified: Secondary | ICD-10-CM

## 2020-12-29 DIAGNOSIS — F509 Eating disorder, unspecified: Secondary | ICD-10-CM

## 2020-12-29 DIAGNOSIS — E039 Hypothyroidism, unspecified: Secondary | ICD-10-CM

## 2020-12-30 ENCOUNTER — Other Ambulatory Visit (HOSPITAL_COMMUNITY): Payer: Self-pay

## 2020-12-30 LAB — CBC WITH DIFFERENTIAL/PLATELET
Absolute Monocytes: 284 cells/uL (ref 200–950)
Basophils Absolute: 21 cells/uL (ref 0–200)
Basophils Relative: 0.6 %
Eosinophils Absolute: 123 cells/uL (ref 15–500)
Eosinophils Relative: 3.5 %
HCT: 41 % (ref 35.0–45.0)
Hemoglobin: 13.3 g/dL (ref 11.7–15.5)
Lymphs Abs: 2006 cells/uL (ref 850–3900)
MCH: 32.5 pg (ref 27.0–33.0)
MCHC: 32.4 g/dL (ref 32.0–36.0)
MCV: 100.2 fL — ABNORMAL HIGH (ref 80.0–100.0)
MPV: 12.1 fL (ref 7.5–12.5)
Monocytes Relative: 8.1 %
Neutro Abs: 1068 cells/uL — ABNORMAL LOW (ref 1500–7800)
Neutrophils Relative %: 30.5 %
Platelets: 201 10*3/uL (ref 140–400)
RBC: 4.09 10*6/uL (ref 3.80–5.10)
RDW: 11.9 % (ref 11.0–15.0)
Total Lymphocyte: 57.3 %
WBC: 3.5 10*3/uL — ABNORMAL LOW (ref 3.8–10.8)

## 2020-12-30 LAB — COMPLETE METABOLIC PANEL WITH GFR
AG Ratio: 1.2 (calc) (ref 1.0–2.5)
ALT: 24 U/L (ref 6–29)
AST: 24 U/L (ref 10–35)
Albumin: 4.2 g/dL (ref 3.6–5.1)
Alkaline phosphatase (APISO): 60 U/L (ref 37–153)
BUN: 16 mg/dL (ref 7–25)
CO2: 28 mmol/L (ref 20–32)
Calcium: 9.2 mg/dL (ref 8.6–10.4)
Chloride: 105 mmol/L (ref 98–110)
Creat: 0.98 mg/dL (ref 0.50–1.05)
Globulin: 3.4 g/dL (calc) (ref 1.9–3.7)
Glucose, Bld: 83 mg/dL (ref 65–99)
Potassium: 4.7 mmol/L (ref 3.5–5.3)
Sodium: 141 mmol/L (ref 135–146)
Total Bilirubin: 0.5 mg/dL (ref 0.2–1.2)
Total Protein: 7.6 g/dL (ref 6.1–8.1)
eGFR: 66 mL/min/{1.73_m2} (ref >=60)

## 2020-12-30 LAB — TSH: TSH: 2.9 mIU/L (ref 0.40–4.50)

## 2020-12-30 LAB — LIPID PANEL
Cholesterol: 208 mg/dL — ABNORMAL HIGH (ref <200)
HDL: 86 mg/dL (ref >=50)
LDL Cholesterol (Calc): 108 mg/dL (calc) — ABNORMAL HIGH
Non-HDL Cholesterol (Calc): 122 mg/dL (calc) (ref <130)
Total CHOL/HDL Ratio: 2.4 (calc) (ref <5.0)
Triglycerides: 53 mg/dL (ref <150)

## 2021-01-02 ENCOUNTER — Other Ambulatory Visit (HOSPITAL_COMMUNITY): Payer: Self-pay

## 2021-01-03 ENCOUNTER — Encounter: Payer: Self-pay | Admitting: Family

## 2021-01-03 ENCOUNTER — Other Ambulatory Visit (HOSPITAL_COMMUNITY): Payer: Self-pay

## 2021-01-04 ENCOUNTER — Other Ambulatory Visit: Payer: Self-pay | Admitting: Internal Medicine

## 2021-01-04 MED FILL — Rimegepant Sulfate Tab Disint 75 MG: ORAL | 30 days supply | Qty: 8 | Fill #4 | Status: AC

## 2021-01-05 ENCOUNTER — Encounter: Payer: Self-pay | Admitting: Internal Medicine

## 2021-01-05 ENCOUNTER — Telehealth: Payer: Self-pay | Admitting: *Deleted

## 2021-01-05 ENCOUNTER — Ambulatory Visit (INDEPENDENT_AMBULATORY_CARE_PROVIDER_SITE_OTHER): Payer: No Typology Code available for payment source | Admitting: Internal Medicine

## 2021-01-05 ENCOUNTER — Other Ambulatory Visit (HOSPITAL_COMMUNITY): Payer: Self-pay

## 2021-01-05 ENCOUNTER — Other Ambulatory Visit: Payer: Self-pay

## 2021-01-05 VITALS — BP 108/68 | HR 63 | Temp 98.6°F | Resp 16 | Ht 67.0 in | Wt 257.0 lb

## 2021-01-05 DIAGNOSIS — Z9884 Bariatric surgery status: Secondary | ICD-10-CM

## 2021-01-05 DIAGNOSIS — D708 Other neutropenia: Secondary | ICD-10-CM

## 2021-01-05 DIAGNOSIS — K909 Intestinal malabsorption, unspecified: Secondary | ICD-10-CM

## 2021-01-05 DIAGNOSIS — E538 Deficiency of other specified B group vitamins: Secondary | ICD-10-CM

## 2021-01-05 DIAGNOSIS — G4709 Other insomnia: Secondary | ICD-10-CM

## 2021-01-05 DIAGNOSIS — G8929 Other chronic pain: Secondary | ICD-10-CM

## 2021-01-05 DIAGNOSIS — M545 Low back pain, unspecified: Secondary | ICD-10-CM

## 2021-01-05 MED ORDER — PROMETHAZINE HCL 25 MG PO TABS
ORAL_TABLET | Freq: Three times a day (TID) | ORAL | 5 refills | Status: DC | PRN
Start: 2021-01-05 — End: 2022-03-20
  Filled 2021-01-05: qty 30, 10d supply, fill #0
  Filled 2021-03-08: qty 30, 10d supply, fill #1
  Filled 2021-06-01: qty 30, 10d supply, fill #2
  Filled 2021-08-27: qty 30, 10d supply, fill #3

## 2021-01-05 MED ORDER — ALPRAZOLAM 1 MG PO TABS
ORAL_TABLET | Freq: Every day | ORAL | 0 refills | Status: DC
  Filled 2021-01-05: qty 90, 90d supply, fill #0

## 2021-01-05 NOTE — Progress Notes (Signed)
   Subjective:    Patient ID: Victoria [name], female    DOB: 11-Dec-1960, 60 y.o.   MRN: [id number]  HPI 60 year old Female with history of gastric bypass surgery, seen for 6 month recheck.  Went to see Dr. [name] recently for chronic back pain.  He reviewed her records.  She had a flareup of back pain over the Summer which had not improved and was affecting her quality of life and particularly standing on her feet a lot at work.  Would get relief with sitting or lying.  Goes to [facility] for follow-up  of iron deficiency related to Gastric Bypass surgery.  Recent CBC shows hemoglobin normal at 13.3 g.  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  Platelet count 201,000.  TSH is WNL on levothyroxine 0.112 mg daily.  Mild elevation of total cholesterol at 208 and LDL cholesterol of 108.  History of B12 deficiency and gives herself monthly B12 injections.  She is asking about colonoscopy.  Last seen by Dr. [name] in 2019.  Had 1 adenomatous colon polyp removed at that time.  Had several hyperplastic polyps.  Repeat study recommended in 5 years.  History of hidradentitis suppurativa treated with Humira  She will be able to sit down more at her job now.  Will be on her feet quite as much and that should help some of her back and knee pain.  Review of Systems no new complaints     Objective:   Physical Exam Neck supple.  Chest clear.  Cardiac exam regular rate and rhythm. Neck is supple. No LE edema.      Assessment & Plan:  Overall is feeling much better. New job should be  better for her ortho issues.  This should help her chronic back pain  andknee pain quite a bit.  Continue to watch diet.  Total cholesterol is 208 LDL cholesterol 108.  HDL is excellent at 86.  Triglycerides are 53.  With regard to iron deficiency her hemoglobin is normal at 13.3 g with an MCV of 100.25  C-Met including liver functions is normal.  TSH is  normal at 2.90 on thyroid replacement medication.  Return in 6 months for health maintenance exam or as needed.  Current dose is levothyroxine 0.112 mg daily.  History of anxiety and insomnia.  Xanax 1 mg refilled which she takes intermittently for sleep if needed.  Hx gastric bypass surgery. Reviewed date of last colonoscopy with her by Dr. [name] abdominal discomfort and change in bowel habits. Referral to Dr. [name] at Pt request.  Our office will call  for her.  Addendum: Jan 27, 2021. Has messaged me that she is having considerable issues with

## 2021-01-05 NOTE — Patient Instructions (Addendum)
It wqs a pleasure to see you today.  Xanax refilled for sleep as requested.  Continue current medications.  Return in 6 months or as needed.  Labs are stable.  Continue same dose of thyroid replacement medication. Referral to Dr. [name] for GI issues.

## 2021-01-05 NOTE — Telephone Encounter (Signed)
Patient asked if Dr.Jertson sent a new referral to urology. Patient reports [facility] called her again, patient reports she had a visit with urology earlier this year (2022) . I explained I didn't see anything recent about a referral. And the it may be a follow up with urology,patient said she was told it was not. Patient said she will reach out to them and cancel it.

## 2021-01-06 ENCOUNTER — Other Ambulatory Visit (HOSPITAL_COMMUNITY): Payer: Self-pay

## 2021-01-06 ENCOUNTER — Ambulatory Visit: Payer: No Typology Code available for payment source | Admitting: Internal Medicine

## 2021-01-21 ENCOUNTER — Encounter: Payer: Self-pay | Admitting: Internal Medicine

## 2021-01-21 NOTE — Patient Instructions (Addendum)
Take hydrocodone APAP 10/325 sparingly for headache.  Take Zithromax Z-PAK 2 tabs day 1 followed by 1 tab days 2 through 5.  May take Diflucan if needed for Candida vaginitis due to antibiotic therapy.  I have refilled levothyroxine 0.112 mg daily.  Continue B12 injections at home monthly.  Call if not improved in 7 to 10 days.  Take prednisone in tapering course for congestion and headache.

## 2021-01-23 ENCOUNTER — Other Ambulatory Visit (HOSPITAL_COMMUNITY): Payer: Self-pay

## 2021-01-25 ENCOUNTER — Encounter: Payer: Self-pay | Admitting: Family

## 2021-01-25 ENCOUNTER — Other Ambulatory Visit (HOSPITAL_COMMUNITY): Payer: Self-pay

## 2021-01-27 ENCOUNTER — Encounter: Payer: Self-pay | Admitting: Internal Medicine

## 2021-01-27 NOTE — Telephone Encounter (Signed)
Dr. [name] will see you next Tuesday. They will call you. I have sent your last note and labs to them today.

## 2021-02-01 ENCOUNTER — Encounter: Payer: Self-pay | Admitting: Internal Medicine

## 2021-02-01 ENCOUNTER — Telehealth: Payer: Self-pay | Admitting: Internal Medicine

## 2021-02-01 ENCOUNTER — Other Ambulatory Visit (HOSPITAL_COMMUNITY): Payer: Self-pay

## 2021-02-01 HISTORY — PX: ESOPHAGOGASTRODUODENOSCOPY: SHX1529

## 2021-02-01 MED ORDER — PANTOPRAZOLE SODIUM 40 MG PO TBEC
DELAYED_RELEASE_TABLET | ORAL | 3 refills | Status: DC
  Filled 2021-02-01: qty 90, 90d supply, fill #0
  Filled 2021-04-20: qty 90, 90d supply, fill #1
  Filled 2021-08-27: qty 90, 90d supply, fill #2
  Filled 2022-01-30: qty 90, 90d supply, fill #3

## 2021-02-01 NOTE — Telephone Encounter (Signed)
Patient was seen today by Dr. [name]. Will be going back on Protonix. Procedure performed and patient says everything was OK. MJB,MD

## 2021-02-02 ENCOUNTER — Encounter: Payer: Self-pay | Admitting: Internal Medicine

## 2021-02-03 ENCOUNTER — Other Ambulatory Visit (HOSPITAL_COMMUNITY): Payer: Self-pay

## 2021-02-03 ENCOUNTER — Ambulatory Visit: Payer: No Typology Code available for payment source | Admitting: Cardiology

## 2021-02-03 ENCOUNTER — Other Ambulatory Visit: Payer: Self-pay | Admitting: Internal Medicine

## 2021-02-04 ENCOUNTER — Other Ambulatory Visit (HOSPITAL_COMMUNITY): Payer: Self-pay

## 2021-02-04 MED ORDER — TIZANIDINE HCL 4 MG PO CAPS
4.0000 mg | ORAL_CAPSULE | Freq: Three times a day (TID) | ORAL | 1 refills | Status: DC
  Filled 2021-02-04: qty 30, 10d supply, fill #0
  Filled 2021-03-08: qty 30, 10d supply, fill #1

## 2021-02-07 ENCOUNTER — Other Ambulatory Visit (HOSPITAL_COMMUNITY): Payer: Self-pay

## 2021-02-14 ENCOUNTER — Other Ambulatory Visit (HOSPITAL_COMMUNITY): Payer: Self-pay

## 2021-02-15 ENCOUNTER — Other Ambulatory Visit (HOSPITAL_COMMUNITY): Payer: Self-pay

## 2021-02-16 ENCOUNTER — Other Ambulatory Visit (HOSPITAL_COMMUNITY): Payer: Self-pay

## 2021-02-22 ENCOUNTER — Other Ambulatory Visit (HOSPITAL_COMMUNITY): Payer: Self-pay

## 2021-02-22 ENCOUNTER — Encounter: Payer: Self-pay | Admitting: Family

## 2021-02-24 ENCOUNTER — Telehealth: Payer: Self-pay | Admitting: Hematology & Oncology

## 2021-02-24 ENCOUNTER — Other Ambulatory Visit: Payer: Self-pay

## 2021-02-24 ENCOUNTER — Inpatient Hospital Stay (HOSPITAL_BASED_OUTPATIENT_CLINIC_OR_DEPARTMENT_OTHER): Payer: No Typology Code available for payment source | Admitting: Family

## 2021-02-24 ENCOUNTER — Inpatient Hospital Stay: Payer: No Typology Code available for payment source | Attending: Hematology & Oncology

## 2021-02-24 VITALS — BP 123/76 | HR 72 | Temp 100.5°F | Resp 17 | Wt 262.0 lb

## 2021-02-24 DIAGNOSIS — K909 Intestinal malabsorption, unspecified: Secondary | ICD-10-CM | POA: Insufficient documentation

## 2021-02-24 DIAGNOSIS — Z9884 Bariatric surgery status: Secondary | ICD-10-CM | POA: Insufficient documentation

## 2021-02-24 DIAGNOSIS — D508 Other iron deficiency anemias: Secondary | ICD-10-CM | POA: Diagnosis not present

## 2021-02-24 DIAGNOSIS — D509 Iron deficiency anemia, unspecified: Secondary | ICD-10-CM | POA: Diagnosis present

## 2021-02-24 LAB — CBC WITH DIFFERENTIAL (CANCER CENTER ONLY)
Abs Immature Granulocytes: 0.01 10*3/uL (ref 0.00–0.07)
Basophils Absolute: 0 10*3/uL (ref 0.0–0.1)
Basophils Relative: 0 %
Eosinophils Absolute: 0 10*3/uL (ref 0.0–0.5)
Eosinophils Relative: 1 %
HCT: 37.7 % (ref 36.0–46.0)
Hemoglobin: 12.7 g/dL (ref 12.0–15.0)
Immature Granulocytes: 0 %
Lymphocytes Relative: 8 %
Lymphs Abs: 0.3 10*3/uL — ABNORMAL LOW (ref 0.7–4.0)
MCH: 33.4 pg (ref 26.0–34.0)
MCHC: 33.7 g/dL (ref 30.0–36.0)
MCV: 99.2 fL (ref 80.0–100.0)
Monocytes Absolute: 0.6 10*3/uL (ref 0.1–1.0)
Monocytes Relative: 17 %
Neutro Abs: 2.4 10*3/uL (ref 1.7–7.7)
Neutrophils Relative %: 74 %
Platelet Count: 165 10*3/uL (ref 150–400)
RBC: 3.8 MIL/uL — ABNORMAL LOW (ref 3.87–5.11)
RDW: 14.5 % (ref 11.5–15.5)
WBC Count: 3.3 10*3/uL — ABNORMAL LOW (ref 4.0–10.5)
nRBC: 0 % (ref 0.0–0.2)

## 2021-02-24 LAB — IRON AND IRON BINDING CAPACITY (CC-WL,HP ONLY)
Iron: 52 ug/dL (ref 28–170)
Saturation Ratios: 14 % (ref 10.4–31.8)
TIBC: 367 ug/dL (ref 250–450)
UIBC: 315 ug/dL (ref 148–442)

## 2021-02-24 LAB — FERRITIN: Ferritin: 38 ng/mL (ref 11–307)

## 2021-02-24 LAB — RETICULOCYTES
Immature Retic Fract: 10.1 % (ref 2.3–15.9)
RBC.: 3.82 MIL/uL — ABNORMAL LOW (ref 3.87–5.11)
Retic Count, Absolute: 48.9 10*3/uL (ref 19.0–186.0)
Retic Ct Pct: 1.3 % (ref 0.4–3.1)

## 2021-02-24 NOTE — Telephone Encounter (Signed)
Attempted to contact patient to schedule, per los 12/30. Left voicemail for patient to call back to proceed with scheduling.

## 2021-02-24 NOTE — Progress Notes (Signed)
Hematology and Oncology Follow Up Visit  Victoria [name] [id number] March 21, 1960 60 y.o. 02/24/2021   Principle Diagnosis:  Iron deficiency anemia secondary to malabsorption, s/p gastric bypass in 2016    Current Therapy:        IV iron as indicated    Interim History:  Victoria [name] is here today for follow-up. She is symptomatic with low grade fever, dry cough, fatigue and headache.  She tested for Covid at home this AM and was negative.  No chills, n/v, rash, dizziness, SOB, chest pain, palpitations, abdominal pain or changes in bowel or bladder habits.  No blood loss noted. No bruising or petechiae.  No swelling, tenderness, numbness or tingling in her extremities.  No falls or syncope to report.  Her appetite is gone right now but she  is doing her best to stay well hydrated.  Her weight is 262 lbs.   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 If you have any questions, ask your nurse or doctor.          STOP taking these medications    hydrocortisone 25 MG suppository Commonly known as: ANUSOL-HC Stopped by: Lottie Dawson, NP   valACYclovir 500 MG tablet Commonly known as: VALTREX Stopped by: Lottie Dawson, NP       TAKE these medications    ALPRAZolam 1 MG tablet Commonly known as: XANAX Take one tablet by mouth at bedtime.   CALCIUM + D3 PO Take 1 tablet by mouth daily.   cyanocobalamin 1000 MCG/ML injection Commonly known as: (VITAMIN B-12) INJECT 1 ML INTRAMUSCULARLY EVERY MONTH   Divigel 1.25 MG/1.25GM Gel Generic drug: Estradiol Use one packet daily as directed   doxepin 150 MG capsule Commonly known as:  SINEQUAN Take 1 capsule (150 mg total) by mouth at bedtime.   flecainide 50 MG tablet Commonly known as: TAMBOCOR Take 50 mg by mouth 2 (two) times daily.   gabapentin 600 MG tablet Commonly known as: NEURONTIN TAKE 1 TABLET BY MOUTH 3 TIMES DAILY   Humira Pen 40 MG/0.8ML Pnkt Generic drug: Adalimumab Inject 1 pen subcutaneously every week   levothyroxine 112 MCG tablet Commonly known as: SYNTHROID TAKE 1 TABLET BY MOUTH ONCE A DAY   metoprolol succinate 25 MG 24 hr tablet Commonly known as: TOPROL-XL TAKE 1 TABLET (25 MG TOTAL) BY MOUTH DAILY.   multivitamin with minerals Tabs tablet Take 1 tablet by mouth daily.   pantoprazole 40 MG tablet Commonly known as: PROTONIX Take 1 tablet by mouth daily 20 minutes before breakfast What changed: Another medication with the same name was removed. Continue taking this medication, and follow the directions you see here. Changed by: Lottie Dawson, NP   polyethylene glycol 17 g packet Commonly known as: MIRALAX / GLYCOLAX Take 17 g by mouth daily as needed for mild constipation.   promethazine 25 MG tablet Commonly known as: PHENERGAN TAKE 1 TABLET BY MOUTH EVERY 8 HOURS AS NEEDED FOR NAUSEA AND/OR VOMITING   sulfamethoxazole-trimethoprim 800-160 MG tablet Commonly known as: BACTRIM DS Take 1 Tablet by mouth twice daily for 1  month   tiZANidine 4 MG capsule Commonly known as: Zanaflex Take 1 capsule (4 mg total) by mouth 3 (three) times daily.        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  Review of Systems: All other 10 point review of systems is negative.   Physical Exam:  weight is 262 lb (118.8 kg). Her oral temperature is 100.5 F (38.1  C) (abnormal). Her blood pressure is 123/76 and her pulse is 72. Her respiration is 17 and oxygen saturation is 100%.   Wt Readings from Last 3 Encounters:  02/24/21 262 lb (118.8 kg)  01/05/21 257 lb (116.6 kg)  12/13/20 260 lb (117.9 kg)    Ocular: Sclerae unicteric, pupils equal, round and reactive to light Ear-nose-throat: Oropharynx clear, dentition fair Lymphatic: No cervical or supraclavicular adenopathy Lungs no rales or rhonchi, good excursion bilaterally Heart regular rate and rhythm, no murmur appreciated Abd soft, nontender, positive bowel sounds MSK no focal spinal tenderness, no joint edema Neuro: non-focal, well-oriented, appropriate affect Breasts: Deferred   Lab Results  Component Value Date   WBC 3.3 (L) 02/24/2021   HGB 12.7 02/24/2021   HCT 37.7 02/24/2021   MCV 99.2 02/24/2021   PLT 165 02/24/2021   Lab Results  Component Value Date   FERRITIN 19 10/24/2020   IRON 105 10/24/2020   TIBC 330 10/24/2020   UIBC 225 10/24/2020   IRONPCTSAT 32 10/24/2020   Lab Results  Component Value Date   RETICCTPCT 1.3 02/24/2021   RBC 3.80 (L) 02/24/2021   RBC 3.82 (L) 02/24/2021   RETICCTABS 36,500 10/02/2018   No results found for: KPAFRELGTCHN, LAMBDASER, [facility] Lab Results  Component Value Date   IGGSERUM 1,609 (H) 05/16/2020   IGMSERUM 106 05/16/2020   No results found for: Odetta Pink, SPEI   Chemistry      Component Value Date/Time   NA 141 12/29/2020 0943   K 4.7 12/29/2020 0943   CL 105 12/29/2020 0943   CO2 28 12/29/2020 0943   BUN 16 12/29/2020 0943   CREATININE 0.98 12/29/2020 0943      Component Value Date/Time   CALCIUM 9.2 12/29/2020 0943   ALKPHOS 59 10/16/2018 0903   AST 24 12/29/2020 0943   AST 26 10/16/2018 0903   ALT 24 12/29/2020 0943   ALT 26 10/16/2018 0903   BILITOT 0.5 12/29/2020 0943   BILITOT 0.5 10/16/2018 0903       Impression and Plan: Victoria [name] is a  very pleasant 60 yo African American female with iron deficiency anemia secondary to malabsorption since laparoscopic gastric bypass in 2016.  Iron studies are pending.  Follow-up in 6 months.   Lottie Dawson, NP 12/30/202210:41 AM

## 2021-02-26 ENCOUNTER — Encounter: Payer: Self-pay | Admitting: Internal Medicine

## 2021-03-02 ENCOUNTER — Telehealth: Payer: Self-pay | Admitting: *Deleted

## 2021-03-02 NOTE — Telephone Encounter (Signed)
Per 02/24/21 los - called and lvm of upcoming appointments - requested call back to confirm

## 2021-03-08 ENCOUNTER — Other Ambulatory Visit: Payer: Self-pay | Admitting: Cardiology

## 2021-03-08 ENCOUNTER — Other Ambulatory Visit (HOSPITAL_COMMUNITY): Payer: Self-pay

## 2021-03-08 MED ORDER — FLECAINIDE ACETATE 50 MG PO TABS
50.0000 mg | ORAL_TABLET | Freq: Two times a day (BID) | ORAL | 1 refills | Status: DC
  Filled 2021-03-08: qty 180, 90d supply, fill #0

## 2021-03-09 ENCOUNTER — Other Ambulatory Visit (HOSPITAL_COMMUNITY): Payer: Self-pay

## 2021-03-13 ENCOUNTER — Other Ambulatory Visit: Payer: Self-pay | Admitting: Internal Medicine

## 2021-03-13 ENCOUNTER — Other Ambulatory Visit (HOSPITAL_COMMUNITY): Payer: Self-pay

## 2021-03-13 MED ORDER — METOPROLOL SUCCINATE ER 25 MG PO TB24
ORAL_TABLET | Freq: Every day | ORAL | 1 refills | Status: DC
  Filled 2021-03-13: qty 90, 90d supply, fill #0

## 2021-03-16 ENCOUNTER — Other Ambulatory Visit (HOSPITAL_COMMUNITY): Payer: Self-pay

## 2021-03-17 ENCOUNTER — Other Ambulatory Visit (HOSPITAL_COMMUNITY): Payer: Self-pay

## 2021-03-17 ENCOUNTER — Other Ambulatory Visit: Payer: Self-pay | Admitting: Neurology

## 2021-03-17 DIAGNOSIS — G43009 Migraine without aura, not intractable, without status migrainosus: Secondary | ICD-10-CM

## 2021-03-20 ENCOUNTER — Other Ambulatory Visit (HOSPITAL_COMMUNITY): Payer: Self-pay

## 2021-03-20 ENCOUNTER — Encounter: Payer: Self-pay | Admitting: Family

## 2021-03-21 ENCOUNTER — Other Ambulatory Visit (HOSPITAL_COMMUNITY): Payer: Self-pay

## 2021-03-21 MED ORDER — NURTEC 75 MG PO TBDP
ORAL_TABLET | ORAL | 0 refills | Status: DC
  Filled 2021-03-21: qty 8, 30d supply, fill #0

## 2021-04-14 ENCOUNTER — Encounter: Payer: Self-pay | Admitting: Cardiology

## 2021-04-14 ENCOUNTER — Ambulatory Visit: Payer: No Typology Code available for payment source | Admitting: Cardiology

## 2021-04-14 ENCOUNTER — Other Ambulatory Visit (HOSPITAL_COMMUNITY): Payer: Self-pay

## 2021-04-14 ENCOUNTER — Other Ambulatory Visit: Payer: Self-pay

## 2021-04-14 VITALS — BP 124/80 | HR 76 | Ht 66.5 in | Wt 265.4 lb

## 2021-04-14 DIAGNOSIS — I471 Supraventricular tachycardia: Secondary | ICD-10-CM | POA: Diagnosis not present

## 2021-04-14 DIAGNOSIS — R5383 Other fatigue: Secondary | ICD-10-CM | POA: Diagnosis not present

## 2021-04-14 DIAGNOSIS — R0609 Other forms of dyspnea: Secondary | ICD-10-CM

## 2021-04-14 DIAGNOSIS — R079 Chest pain, unspecified: Secondary | ICD-10-CM | POA: Diagnosis not present

## 2021-04-14 MED ORDER — FLECAINIDE ACETATE 50 MG PO TABS
50.0000 mg | ORAL_TABLET | Freq: Two times a day (BID) | ORAL | 3 refills | Status: DC
  Filled 2021-04-14 – 2021-08-27 (×2): qty 180, 90d supply, fill #0
  Filled 2022-03-20: qty 180, 90d supply, fill #1

## 2021-04-14 MED ORDER — METOPROLOL SUCCINATE ER 25 MG PO TB24
ORAL_TABLET | Freq: Every day | ORAL | 3 refills | Status: DC
  Filled 2021-04-14: qty 90, fill #0
  Filled 2021-06-01: qty 90, 90d supply, fill #0
  Filled 2021-08-27: qty 90, 90d supply, fill #1
  Filled 2021-12-14: qty 90, 90d supply, fill #2
  Filled 2022-03-20: qty 90, 90d supply, fill #3

## 2021-04-14 NOTE — Patient Instructions (Addendum)
Medication Instructions:  Your physician recommends that you continue on your current medications as directed. Please refer to the Current Medication list given to you today.  *If you need a refill on your cardiac medications before your next appointment, please call your pharmacy*   Lab Work: None ordered  If you have labs (blood work) drawn today and your tests are completely normal, you will receive your results only by: [place] (if you have MyChart) OR A paper copy in the mail If you have any lab test that is abnormal or we need to change your treatment, we will call you to review the results.   Testing/Procedures: Your physician has recommended that you have a sleep study. This test records several body functions during sleep, including: brain activity, eye movement, oxygen and carbon dioxide blood levels, heart rate and rhythm, breathing rate and rhythm, the flow of air through your mouth and nose, snoring, body muscle movements, and chest and belly movement.    Follow-Up: At [facility], you and your health needs are our priority.  As part of our continuing mission to provide you with exceptional heart care, we have created designated Provider Care Teams.  These Care Teams include your primary Cardiologist (physician) and Advanced Practice Providers (APPs -  Physician Assistants and Nurse Practitioners) who all work together to provide you with the care you need, when you need it.  We recommend signing up for the patient portal called "MyChart".  Sign up information is provided on this After Visit Summary.  MyChart is used to connect with patients for Virtual Visits (Telemedicine).  Patients are able to view lab/test results, encounter notes, upcoming appointments, etc.  Non-urgent messages can be sent to your provider as well.   To learn more about what you can do with MyChart, go to [email or web address].    Your next appointment:   12 month(s)  The format for your  next appointment:   In Person  Provider:   Fransico Him, MD     Other Instructions

## 2021-04-14 NOTE — Progress Notes (Addendum)
Date:  04/14/2021   ID:  Victoria [name], DOB 08/06/1960, MRN [id number] The patient was identified using 2 identifiers. PCP:  Elby Showers, MD  Cardiologist:  Fransico Him, MD  Electrophysiologist:  None   Evaluation Performed: Office Followup  Chief Complaint:  Palpitations/chest pain  History of Present Illness:    Victoria [name] is a 61 y.o. female with with a history of GERD, hiatal hernia and chest pain.  She was initially see by me several years ago for chest pain and nuclear stress test showed low risk study with a very small anterior perfusion defect that most likely represents breast attenuation artifact. Less likely this could represent a true perfusion abnormality in the distribution of a diagonal artery branch of the LAD.  Her EF was normal.  She was started on Protonix for possible GERD symptoms and CP resolved.   She last saw me in 2018 at which time she was having SOB.  She had a gastric bypass in 2016 and had not felt well since then.  Her DOE was with talking and walking and also was having some epigastric pain at night.  2D echo was normal which was normal. She had an ETT done which showed no ischemia.    Repeat 2D echo 3/22 was normal and coronary CTA showed no CAD and Ca score of 0 on 2/22.  She wore an event monitor which showed PVCs, PACs, nonsustained atrial tachycardia, NSVT and she was placed on Toprol XL 25mg  daily and ultimately flecainide 50 mg twice daily was added as well for ongoing palpitations.    She she is here today for followup and is doing well.  She has chronic DOE with walking that she thinks has gotten a little worse but has not been exercising. She denies any chest pain or pressure, , PND, orthopnea or syncope. She occasionally has some mild LE edema.  She has not had very many episodes of palpitations and only takes flecainide 50mg  qam and the PM dose she only takes if she gets breakthrough palpitation.  She is compliant with  her meds and is tolerating meds with no SE.     Past Medical History:  Diagnosis Date   Back pain    Bursitis    right shoulder   Chronic leukopenia    intermittant since 2010, followed by Dr. Renold Genta now   Constipation    Degenerative joint disease of low back 09/02/15    Dr. Maureen Ralphs   Edema of both lower legs    Fibrocystic breast    Gallbladder problem    GERD (gastroesophageal reflux disease)    Headache    per pt more stress/tension   Heart palpitations    SEE EPIC ENCOUTNER , CARDIOLOGY DR. Tressia Miners Ezekeil Bethel 2018; reports on 05-16-17 " i haven't had any bouts of those lately"     Hemorrhoids    History of Clostridium difficile infection 12/2010   History of exercise intolerance    ETT on 04-27-2016-- negative ([facility] treadmill score 7)   History of Helicobacter pylori infection 2001 and 09/ 2012   HSV-2 infection    genital   Hx of adenomatous colonic polyps 10/17/2007   Hydradenitis    per pt currently treated with Humira injection   Hypothyroidism, postsurgical 1980   IBS (irritable bowel syndrome)    Joint pain    Medial meniscus tear    right knee   Osteoarthritis    Palpitations    Pernicious anemia  b12 def   PONV (postoperative nausea and vomiting)    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   Prediabetes    Reactive hypoglycemia followed by pcp   post gastrectomy dumping syndrome   SOB (shortness of breath)    Tendonitis    right shoulder   Varicose veins    Vitamin B 12 deficiency    Vitamin D deficiency    Past Surgical History:  Procedure Laterality Date   BREATH TEK H PYLORI N/A 05/03/2014   Procedure: BREATH TEK H PYLORI;  Surgeon: Kaylyn Lim, MD;  Location: [facility];  Service: General;  Laterality: N/A;   CARDIOVASCULAR STRESS TEST  03/11/2013   dr Tressia Miners Cira Deyoe   Low risk nuclear study w/ a very small anterior perfusion defect that most likely represents breast attenuation artifact, less likely this could represent a true perfusion abnormality  in the distribution of diagonal branch of the LAD/  normal LV function and wall motion , ef 66%   CATARACT EXTRACTION W/ INTRAOCULAR LENS  IMPLANT, BILATERAL  10/2017   [place]   COLONOSCOPY  02/2017   Dr [name] ; per patient they found 7 polyps with polypectomy; on 5 year track    ESOPHAGOGASTRODUODENOSCOPY  02/01/2021   FINE NEEDLE ASPIRATION Left 05/22/2017   Procedure: LEFT KNEE ASPIRATION;  Surgeon: Gaynelle Arabian, MD;  Location: [facility];  Service: Orthopedics;  Laterality: Left;   GASTRIC ROUX-EN-Y N/A 07/06/2014   Procedure: LAPAROSCOPIC ROUX-EN-Y GASTRIC BYPASS, ENTEROLYSIS OF ADHESIONS WITH UPPER ENDOSCOPY;  Surgeon: Johnathan Hausen, MD;  Location: [facility];  Service: General;  Laterality: N/A;   HAMMER TOE SURGERY Bilateral 02/17/2008   bilateral foot digit's 2 and 5   HIATAL HERNIA REPAIR N/A 07/06/2014   Procedure: LAPAROSCOPIC REPAIR OF HIATAL HERNIA;  Surgeon: Johnathan Hausen, MD;  Location: [facility];  Service: General;  Laterality: N/A;   I & D KNEE WITH POLY EXCHANGE Left 12/11/2019   Procedure: LEFT KNEE POLY-LINER EXCHANGE;  Surgeon: Mcarthur Rossetti, MD;  Location: [facility];  Service: Orthopedics;  Laterality: Left;   KNEE ARTHROSCOPY Right 12/11/2019   Procedure: RIGHT KNEE ARTHROSCOPY WITH PARTIAL MEDIAL MENISCECTOMY;  Surgeon: Mcarthur Rossetti, MD;  Location: [facility];  Service: Orthopedics;  Laterality: Right;   KNEE ARTHROSCOPY WITH MEDIAL MENISECTOMY Left 07/17/2017   Procedure: LEFT KNEE ARTHROSCOPY WITH MEDIAL LATERAL MENISECTOMY;  Surgeon: Gaynelle Arabian, MD;  Location: [facility];  Service: Orthopedics;  Laterality: Left;   MASS EXCISION N/A 10/11/2016   Procedure: EXCISION OF PERIANAL MASS;  Surgeon: [name], MD;  Location: [place];  Service: General;  Laterality: N/A;   [place]  08-25-2002   dr Margaretha Glassing   ovaries retained    TOTAL HIP ARTHROPLASTY Right 05/21/2012   Procedure: TOTAL HIP ARTHROPLASTY;  Surgeon: Gearlean Alf, MD;  Location: [facility];  Service: Orthopedics;  Laterality: Right;   TOTAL HIP ARTHROPLASTY Left 01-31-2009  dr Wynelle Link   TOTAL HIP REVISION Left 05/22/2017   Procedure: Left hip bearing surface and head revision;  Surgeon: Gaynelle Arabian, MD;  Location: [facility];  Service: Orthopedics;  Laterality: Left;   TOTAL KNEE ARTHROPLASTY Left 01/20/2018   Procedure: LEFT TOTAL KNEE ARTHROPLASTY;  Surgeon: Gaynelle Arabian, MD;  Location: [facility];  Service: Orthopedics;  Laterality: Left;  31min   TRANSTHORACIC ECHOCARDIOGRAM  05/03/2016   ef 55-60%/  trivial MR/  mild TR   TUBAL LIGATION       Current Meds  Medication Sig   Adalimumab (HUMIRA PEN) 40 MG/0.8ML PNKT Inject 1 pen subcutaneously every week   ALPRAZolam (XANAX) 1 MG tablet Take one tablet by mouth at bedtime.   Calcium Carb-Cholecalciferol (CALCIUM + D3 PO) Take 1 tablet by mouth daily.   cyanocobalamin (,VITAMIN B-12,) 1000 MCG/ML injection INJECT 1 ML INTRAMUSCULARLY EVERY MONTH   Estradiol (DIVIGEL) 1.25 MG/1.25GM GEL Use one packet daily as directed   flecainide (TAMBOCOR) 50 MG tablet Take 1 tablet (50 mg total) by mouth 2 (two) times daily.   gabapentin (NEURONTIN) 600 MG tablet TAKE 1 TABLET BY MOUTH 3 TIMES DAILY (Patient taking differently: Take by mouth as needed (PAIN).)   levothyroxine (SYNTHROID) 112 MCG tablet TAKE 1 TABLET BY MOUTH ONCE A DAY   metoprolol succinate (TOPROL-XL) 25 MG 24 hr tablet TAKE 1 TABLET BY MOUTH DAILY.   Multiple Vitamin (MULTIVITAMIN WITH MINERALS) TABS tablet Take 1 tablet by mouth daily.   NURTEC 75 MG TBDP DISSOLVE 1 TABLET BY MOUTH DAILY AS NEEDED FOR MIGRAINES. TAKE AS CLOSE TO ONSET OF MIGRAINE AS POSSIBLE. ONE DAILY MAXIMUM.   pantoprazole (PROTONIX) 40 MG tablet Take 1 tablet by mouth daily 20 minutes before breakfast   promethazine (PHENERGAN) 25 MG tablet TAKE 1 TABLET BY MOUTH EVERY 8 HOURS  AS NEEDED FOR NAUSEA AND/OR VOMITING   sulfamethoxazole-trimethoprim (BACTRIM DS) 800-160 MG tablet Take 1 Tablet by mouth twice daily for 1 month (Patient taking differently: Take 1 tablet by mouth as needed (FLARE UP).)   tiZANidine (ZANAFLEX) 4 MG capsule Take 1 capsule (4 mg total) by mouth 3 (three) times daily. (Patient taking differently: Take 4 mg by mouth as needed for muscle spasms.)     Allergies:   Other and Estradiol   Social History   Tobacco Use   Smoking status: Never   Smokeless tobacco: Never  Vaping Use   Vaping Use: Never used  Substance Use Topics   Alcohol use: No   Drug use: No     Family Hx: The patient's family history includes Diabetes in her father and sister; Headache in her mother; Heart disease in her father; High Cholesterol in her father and mother; High blood pressure in her father; Hypertension in her mother and sister; Obesity in her father and mother; Thyroid disease in her mother.  ROS:   Please see the history of present illness.     All other systems reviewed and are negative.   Prior CV studies:   The following studies were reviewed today:  Event monitor 04/2020 Study Highlights    Predominant rhythm was normal sinus rhythm with average heart rate 65bpm. The heart rate ranged from 42 to 127bpm. Frequent PACs, atrial couplets, atrial bigeminy Nonsustained atrial tachycardia up to 24 beats in a row with fastest rate up to 200bpm. Occasional PVCs and venctiular couplets. Wide complex tachycardia up to 5 beats in a row.    2D echo 2022 IMPRESSIONS   1. Left ventricular ejection fraction, by estimation, is 65 to 70%. Left  ventricular ejection fraction by 3D volume is 66 %. The left ventricle has  normal function. The left ventricle has no regional wall motion  abnormalities. Left ventricular diastolic   parameters were normal. The average left ventricular global longitudinal  strain is -26.9 %. The global longitudinal strain is  normal.   2. Right ventricular systolic function is normal. The right ventricular  size is  normal.   3. The mitral valve is normal in structure. Trivial mitral valve  regurgitation. No evidence of mitral stenosis.   4. The aortic valve is tricuspid. Aortic valve regurgitation is not  visualized. Mild aortic valve sclerosis is present, with no evidence of  aortic valve stenosis.   Coronary CTA IMPRESSION: 1. Coronary calcium score of 0. This was 0 percentile for age and sex matched control.   2. Normal coronary origin with right dominance.   3. No evidence of CAD.   4. CAD-RADS 0. No evidence of CAD (0%). Consider non-atherosclerotic causes of chest pain.  Labs/Other Tests and Data Reviewed:    EKG:  Not performed today  Recent Labs: 12/29/2020: ALT 24; BUN 16; Creat 0.98; Potassium 4.7; Sodium 141; TSH 2.90 02/24/2021: Hemoglobin 12.7; Platelet Count 165   Recent Lipid Panel Lab Results  Component Value Date/Time   CHOL 208 (H) 12/29/2020 09:43 AM   TRIG 53 12/29/2020 09:43 AM   HDL 86 12/29/2020 09:43 AM   CHOLHDL 2.4 12/29/2020 09:43 AM   LDLCALC 108 (H) 12/29/2020 09:43 AM    Wt Readings from Last 3 Encounters:  04/14/21 265 lb 6.4 oz (120.4 kg)  02/24/21 262 lb (118.8 kg)  01/05/21 257 lb (116.6 kg)        Objective:    Vital Signs:  BP 124/80    Pulse 76    Ht 5' 6.5" (1.689 m)    Wt 265 lb 6.4 oz (120.4 kg)    SpO2 95%    BMI 42.19 kg/m   GEN: Well nourished, well developed in no acute distress HEENT: Normal NECK: No JVD; No carotid bruits LYMPHATICS: No lymphadenopathy CARDIAC:RRR, no murmurs, rubs, gallops RESPIRATORY:  Clear to auscultation without rales, wheezing or rhonchi  ABDOMEN: Soft, non-tender, non-distended MUSCULOSKELETAL:  No edema; No deformity  SKIN: Warm and dry NEUROLOGIC:  Alert and oriented x 3 PSYCHIATRIC:  Normal affect   ASSESSMENT & PLAN:    Palpitations/Nonsustained atrial tachycardia -event monitor 04/2020 showed PVCs,  PACs, nonsustained atrial tachycardia and nonsustained VT -coronary CTA showed no CAD and echo showed normal LVF -Palpitations of seem to improve on beta-blocker and flecainide -Continue prescription drug management Toprol-XL 25 mg daily and flecainide 50 mg qam and extra dose for breakthrough when needed with as needed refills  Chest pain -Coronary CTA was completely normal in February 2022 and 2D echo was normal -Likely related to GERD as improved on Protonix>>CP has resolved  DOE -2Decho 04/2020 showed normal LVF and no CAD -likely related to morbid obesity and sedentary state -she also is feeling fatigued during the day and I suspect she may have OSA -I will get a split night sleep study to assess further  Medication Adjustments/Labs and Tests Ordered: Current medicines are reviewed at length with the patient today.  Concerns regarding medicines are outlined above.   Tests Ordered: No orders of the defined types were placed in this encounter.    Medication Changes: No orders of the defined types were placed in this encounter.    Follow Up: with me in 4 weeks  Signed, Fransico Him, MD  04/14/2021 9:33 AM    [place]

## 2021-04-14 NOTE — Addendum Note (Signed)
Addended by: Gaetano Net on: 04/14/2021 09:40 AM   Modules accepted: Orders

## 2021-04-20 ENCOUNTER — Other Ambulatory Visit (HOSPITAL_COMMUNITY): Payer: Self-pay

## 2021-04-20 ENCOUNTER — Other Ambulatory Visit: Payer: Self-pay | Admitting: Internal Medicine

## 2021-04-20 MED ORDER — TIZANIDINE HCL 4 MG PO CAPS
4.0000 mg | ORAL_CAPSULE | Freq: Three times a day (TID) | ORAL | 1 refills | Status: DC
  Filled 2021-04-20: qty 30, 10d supply, fill #0

## 2021-04-21 ENCOUNTER — Other Ambulatory Visit (HOSPITAL_COMMUNITY): Payer: Self-pay

## 2021-04-24 ENCOUNTER — Other Ambulatory Visit (HOSPITAL_COMMUNITY): Payer: Self-pay

## 2021-04-25 ENCOUNTER — Other Ambulatory Visit (HOSPITAL_COMMUNITY): Payer: Self-pay

## 2021-04-26 ENCOUNTER — Other Ambulatory Visit (HOSPITAL_COMMUNITY): Payer: Self-pay

## 2021-04-26 ENCOUNTER — Other Ambulatory Visit: Payer: Self-pay

## 2021-04-27 ENCOUNTER — Other Ambulatory Visit (HOSPITAL_COMMUNITY): Payer: Self-pay

## 2021-05-01 ENCOUNTER — Other Ambulatory Visit (HOSPITAL_COMMUNITY): Payer: Self-pay

## 2021-05-01 MED ORDER — HUMIRA PEN 40 MG/0.8ML ~~LOC~~ PNKT
PEN_INJECTOR | SUBCUTANEOUS | 5 refills | Status: DC
  Filled 2021-05-04: qty 4, 28d supply, fill #0

## 2021-05-04 ENCOUNTER — Encounter: Payer: Self-pay | Admitting: Family

## 2021-05-04 ENCOUNTER — Other Ambulatory Visit (HOSPITAL_COMMUNITY): Payer: Self-pay

## 2021-05-04 ENCOUNTER — Other Ambulatory Visit: Payer: Self-pay | Admitting: Pharmacist

## 2021-05-04 MED ORDER — HUMIRA PEN 40 MG/0.8ML ~~LOC~~ PNKT
PEN_INJECTOR | SUBCUTANEOUS | 5 refills | Status: DC
Start: 2021-05-04 — End: 2021-10-18
  Filled 2021-05-04: qty 4, 28d supply, fill #0
  Filled 2021-05-23 – 2021-05-29 (×2): qty 4, 28d supply, fill #1
  Filled 2021-06-15: qty 4, 28d supply, fill #2
  Filled 2021-07-20: qty 4, 28d supply, fill #3
  Filled 2021-08-14: qty 4, 28d supply, fill #4
  Filled 2021-09-13: qty 4, 28d supply, fill #5

## 2021-05-09 ENCOUNTER — Other Ambulatory Visit: Payer: Self-pay | Admitting: Internal Medicine

## 2021-05-09 DIAGNOSIS — Z1231 Encounter for screening mammogram for malignant neoplasm of breast: Secondary | ICD-10-CM

## 2021-05-16 ENCOUNTER — Other Ambulatory Visit (HOSPITAL_COMMUNITY): Payer: Self-pay

## 2021-05-23 ENCOUNTER — Other Ambulatory Visit (HOSPITAL_COMMUNITY): Payer: Self-pay

## 2021-05-25 ENCOUNTER — Other Ambulatory Visit (HOSPITAL_COMMUNITY): Payer: Self-pay

## 2021-05-26 ENCOUNTER — Telehealth: Payer: Self-pay | Admitting: *Deleted

## 2021-05-26 ENCOUNTER — Ambulatory Visit
Admission: RE | Admit: 2021-05-26 | Discharge: 2021-05-26 | Disposition: A | Discharge status: Home / Self Care | Payer: No Typology Code available for payment source | Source: Ambulatory Visit | Attending: Internal Medicine | Admitting: Internal Medicine

## 2021-05-26 DIAGNOSIS — Z1231 Encounter for screening mammogram for malignant neoplasm of breast: Secondary | ICD-10-CM

## 2021-05-26 NOTE — Telephone Encounter (Signed)
Prior Authorization for SPLIT NIGHT sent to [facility] via Phone.  ?OK TO SCHEDULE  TURNER TO READ  ?call ref# [id number] ?APPROVED ?AUTH# [id number] ?4/26/-23---07-21-21 ?

## 2021-05-27 ENCOUNTER — Other Ambulatory Visit (HOSPITAL_COMMUNITY): Payer: Self-pay

## 2021-05-29 ENCOUNTER — Other Ambulatory Visit (HOSPITAL_COMMUNITY): Payer: Self-pay

## 2021-05-29 ENCOUNTER — Encounter: Payer: Self-pay | Admitting: Family

## 2021-05-31 ENCOUNTER — Ambulatory Visit
Admission: RE | Admit: 2021-05-31 | Discharge: 2021-05-31 | Disposition: A | Discharge status: Home / Self Care | Payer: No Typology Code available for payment source | Source: Ambulatory Visit | Attending: Internal Medicine | Admitting: Internal Medicine

## 2021-05-31 ENCOUNTER — Other Ambulatory Visit (HOSPITAL_COMMUNITY): Payer: Self-pay

## 2021-05-31 ENCOUNTER — Ambulatory Visit (INDEPENDENT_AMBULATORY_CARE_PROVIDER_SITE_OTHER): Payer: No Typology Code available for payment source | Admitting: Internal Medicine

## 2021-05-31 ENCOUNTER — Telehealth: Payer: Self-pay | Admitting: Internal Medicine

## 2021-05-31 ENCOUNTER — Encounter: Payer: Self-pay | Admitting: Internal Medicine

## 2021-05-31 VITALS — BP 130/80 | HR 90 | Temp 97.0°F

## 2021-05-31 DIAGNOSIS — J22 Unspecified acute lower respiratory infection: Secondary | ICD-10-CM

## 2021-05-31 DIAGNOSIS — R0989 Other specified symptoms and signs involving the circulatory and respiratory systems: Secondary | ICD-10-CM | POA: Diagnosis not present

## 2021-05-31 MED ORDER — HYDROCODONE BIT-HOMATROP MBR 5-1.5 MG/5ML PO SOLN
5.0000 mL | Freq: Three times a day (TID) | ORAL | 0 refills | Status: DC | PRN
  Filled 2021-05-31: qty 120, 8d supply, fill #0

## 2021-05-31 MED ORDER — TIZANIDINE HCL 4 MG PO CAPS
4.0000 mg | ORAL_CAPSULE | Freq: Three times a day (TID) | ORAL | 1 refills | Status: DC | PRN
  Filled 2021-05-31: qty 90, 30d supply, fill #0
  Filled 2021-08-27: qty 90, 30d supply, fill #1

## 2021-05-31 MED ORDER — DOXYCYCLINE HYCLATE 100 MG PO TABS
100.0000 mg | ORAL_TABLET | Freq: Two times a day (BID) | ORAL | 0 refills | Status: DC
  Filled 2021-05-31: qty 20, 10d supply, fill #0

## 2021-05-31 MED ORDER — ALBUTEROL SULFATE HFA 108 (90 BASE) MCG/ACT IN AERS
2.0000 | INHALATION_SPRAY | Freq: Four times a day (QID) | RESPIRATORY_TRACT | 0 refills | Status: DC | PRN
Start: 2021-05-31 — End: 2021-07-12
  Filled 2021-05-31: qty 8.5, 25d supply, fill #0

## 2021-05-31 NOTE — Telephone Encounter (Signed)
Afifa Truax ?[phone] ? ?Aleisha called to say she is not feeling well, has chronic cough, runny nose, drainage, left ear ache that runs down to her neck, this all started over the weekend. She is going to do home COVID test and call me back. ?

## 2021-05-31 NOTE — Telephone Encounter (Signed)
COVID test was negative.

## 2021-05-31 NOTE — Progress Notes (Signed)
? ?  Subjective:  ? ? Patient ID: Gevena [name], female    DOB: 12/30/1960, 61 y.o.   MRN: [id number] ? ?HPI 61 year old Female seen with respiratory infection. Sounds hoarse when she speaks and has malaise and fatigue. Symptoms started yesterday. 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 Husband is not ill with respiratory infection. Left ear hurts. ? ?History of hidradenitis and is on DMARD for prevention.Recently prescribed an antibiotic for a flare up if needed. Has not started that yet. ? ?History of lumbar spinal stenosis seen by Dr. [name] for epidural steroid injections, history of B12 deficiency and administers monthly B12 injections to herself, history of gastric bypass surgery for weight loss.  History of PAT seen by Dr. Radford Pax and is being treated with Tambocor and metoprolol.  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  History of anxiety and insomnia treated with Xanax.  History of hypothyroidism treated with thyroid replacement medication.  She is on estradiol gel per GYN physician.  Dr. Jaynee Eagles has her on Nurtec for migraine headaches.  She has Zanaflex for musculoskeletal pain. ? ? ? ? ? ?Review of Systems no fever or shaking chills.  Has malaise and fatigue.  Sounds nasally congested and hoarse.  Has sore throat. ? ?   ?Objective:  ? Physical Exam ? VS reviewed. Skin warm and dry. RR 12-14 ?Left TM full and pink.  Right TM clear.  Pharynx slightly injected.  No exudate.  Neck is supple.  Chest: She has fine rales in the upper lobes right greater than left.  No rhonchi.  Occasional inspiratory wheeze. ? ? ?   ?Assessment & Plan:  ?Acute lower respiratory infection-rule out pneumonia.  Has rales in upper lobes right greater than left.  Chest x-ray ordered.  She will be treated with doxycycline 100 mg twice daily for 10 days.  Have refilled Ventolin inhaler and prescribed Hycodan 1 teaspoon every 8 hours  as needed for cough.  Rest and drink fluids.  Monitor pulse oximetry. ? ?Left otitis media-see above treated with doxycycline ? ?History of gastric bypass surgery ? ?Hypothyroidism-takes thyroid replacement medication ? ?History of B12 deficiency-gives herself monthly B12 injections ? ?History of hidradenitis suppurativa-is on DMARD and sometimes requires antibiotics ? ?History of chronic lumbar back pain-has seen Dr. [name] for epidural steroid injections ? ?History of iron deficiency secondary to gastric bypass surgery ? ?History of PAT seen by Dr. Radford Pax.  Treated with Tambocor and metoprolol. ? ?History of migraine headaches seen by Dr. Jaynee Eagles ? ?History of surgery for meniscal tear right knee 2021 ? ?GE reflux treated with Protonix ? ?Insomnia/anxiety treated with Xanax at bedtime ? ?Plan: She will take doxycycline 100 mg twice daily for 10 days.  Hycodan 1 teaspoon every 8 hours as needed for cough.  Rest and drink fluids.  Monitor pulse oximetry.  Albuterol inhaler prescribed 2 sprays by mouth 4 times daily as needed. ? ?Regarding musculoskeletal pain especially lumbar pain,Zanaflex refilled at her request. ? ?

## 2021-05-31 NOTE — Patient Instructions (Addendum)
Please have chest x-ray today.  Order has been placed.  Take doxycycline 100 mg twice daily for 10 days.  May take Hycodan 1 teaspoon every 8 hours as needed for cough.  Rest and drink fluids.  Albuterol inhaler 2 sprays by mouth 4 times daily as needed.  Monitor pulse oximetry.  Zanaflex refilled for musculoskeletal pain at patient request. ?

## 2021-05-31 NOTE — Telephone Encounter (Signed)
scheduled

## 2021-06-01 ENCOUNTER — Other Ambulatory Visit (HOSPITAL_COMMUNITY): Payer: Self-pay

## 2021-06-01 ENCOUNTER — Other Ambulatory Visit: Payer: Self-pay | Admitting: Internal Medicine

## 2021-06-01 MED ORDER — ALPRAZOLAM 1 MG PO TABS
ORAL_TABLET | Freq: Every day | ORAL | 0 refills | Status: DC
  Filled 2021-06-01: qty 90, 90d supply, fill #0

## 2021-06-01 MED ORDER — LEVOTHYROXINE SODIUM 112 MCG PO TABS
ORAL_TABLET | Freq: Every day | ORAL | 1 refills | Status: DC
  Filled 2021-06-01: qty 90, 90d supply, fill #0
  Filled 2021-08-27: qty 90, 90d supply, fill #1

## 2021-06-02 ENCOUNTER — Other Ambulatory Visit (HOSPITAL_COMMUNITY): Payer: Self-pay

## 2021-06-07 ENCOUNTER — Encounter: Payer: Self-pay | Admitting: Internal Medicine

## 2021-06-09 ENCOUNTER — Encounter: Payer: Self-pay | Admitting: Internal Medicine

## 2021-06-09 ENCOUNTER — Other Ambulatory Visit (HOSPITAL_COMMUNITY): Payer: Self-pay

## 2021-06-09 ENCOUNTER — Ambulatory Visit (INDEPENDENT_AMBULATORY_CARE_PROVIDER_SITE_OTHER): Payer: No Typology Code available for payment source | Admitting: Internal Medicine

## 2021-06-09 VITALS — BP 108/72 | HR 72 | Temp 97.8°F | Ht 66.5 in | Wt 273.0 lb

## 2021-06-09 DIAGNOSIS — Z6841 Body Mass Index (BMI) 40.0 and over, adult: Secondary | ICD-10-CM

## 2021-06-09 DIAGNOSIS — R635 Abnormal weight gain: Secondary | ICD-10-CM | POA: Diagnosis not present

## 2021-06-09 DIAGNOSIS — E538 Deficiency of other specified B group vitamins: Secondary | ICD-10-CM

## 2021-06-09 DIAGNOSIS — H6501 Acute serous otitis media, right ear: Secondary | ICD-10-CM | POA: Diagnosis not present

## 2021-06-09 DIAGNOSIS — J069 Acute upper respiratory infection, unspecified: Secondary | ICD-10-CM | POA: Diagnosis not present

## 2021-06-09 DIAGNOSIS — K219 Gastro-esophageal reflux disease without esophagitis: Secondary | ICD-10-CM

## 2021-06-09 DIAGNOSIS — R5383 Other fatigue: Secondary | ICD-10-CM | POA: Diagnosis not present

## 2021-06-09 DIAGNOSIS — Z9884 Bariatric surgery status: Secondary | ICD-10-CM

## 2021-06-09 MED ORDER — HYDROCODONE BIT-HOMATROP MBR 5-1.5 MG/5ML PO SOLN
5.0000 mL | Freq: Three times a day (TID) | ORAL | 0 refills | Status: DC | PRN
  Filled 2021-06-09: qty 120, 8d supply, fill #0

## 2021-06-09 MED ORDER — PREDNISONE 10 MG (21) PO TBPK
ORAL_TABLET | ORAL | 0 refills | Status: DC
  Filled 2021-06-09: qty 21, 6d supply, fill #0

## 2021-06-09 NOTE — Progress Notes (Signed)
? ?  Subjective:  ? ? Patient ID: Victoria [name], female    DOB: 03/18/1960, 61 y.o.   MRN: [id number] ? ?HPI 61 year old Female seen with protracted respiratory infection. Has  right ear ache and post nasal drainage. Draianage is thick and slightly discolored.  Was treated on April 5 with a 10-day course of doxycycline and prescribed an albuterol inhaler. ? ?Also has history of gastric bypass surgery. Went to [facility] in May 2022. Lost about 9 pounds in 6 weeks. Also was dx with Vitamin D deficiency. Did not feel like it helped her . ? ?She has to be careful because she gets reactive hypoglycemia s/p gastric bypass surgery. I do not know if Wegovy would be a weight loss option for her. She has had mild glucose intolerance. In May 2022, Hgb was 5.7 %. ? ?Has had iron deficiency with gastric bypass surgery.  Has been seen at the cancer center for this.  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 ? ? ?Review of Systems recently was prescribed doxycycline for  respiratory infection and it did not agree with GI tract.  She had reflux symptoms and gastritis symptoms. ? ?   ?Objective:  ? Physical Exam ?Blood pressure 108/72 pulse 72 regular temperature 97.8 degrees by tympanic thermometer pulse oximetry 98% on room air weight 273 pounds height 5 feet 6.5 inches BMI 43.40 ? ?Skin: Warm and dry.  No cervical adenopathy.  TMs and pharynx are clear.  Neck is supple.  Chest is clear to auscultation. ? ?   ?Assessment & Plan:  ?Protracted respiratory infection ? ?History of gastric bypass surgery complicated for a while by reactive hypoglycemia.  Iron studies in December were normal.  She is frustrated with weight loss efforts.  Referred to Endocrinology. ? ?Allergic rhinitis-patient would like to see allergist once again ? ?History of leukopenia-felt to be benign and dates back several years at least to 2018.  We can refer to Hematology if she  so desires. ? ?Infrequent classical migraine headaches-treated by Dr. Jaynee Eagles with Nurtec ? ?Insomnia-was prescribed Xanax 1 mg at bedtime recently on April 6 ? ?Hypothyroidism treated with thyroid replacement medication.  TSH in November was normal. ? ?Musculoskeletal pain treated with Zanaflex ? ?Plan: Hycodan 1 teaspoon every 8 hours as needed for cough.  Take prednisone in tapering course 10 mg tablets starting with 6 tablets day 1 and decreasing by 1 tablet daily i.e. 6-5-4-3-2-1.  Continue thyroid replacement medication.  May have DVT allergy evaluation if symptoms persist. ? ?

## 2021-06-09 NOTE — Patient Instructions (Addendum)
Take Prednisone as directed in tapering course for 6 days and repeat if needed. Take Hycodan sparingly for cough. We will ask Endocrine to see you about lack of weight loss after gastric bypass surgery. ?

## 2021-06-14 ENCOUNTER — Other Ambulatory Visit (HOSPITAL_COMMUNITY): Payer: Self-pay

## 2021-06-14 ENCOUNTER — Other Ambulatory Visit: Payer: Self-pay | Admitting: Neurology

## 2021-06-14 DIAGNOSIS — G43009 Migraine without aura, not intractable, without status migrainosus: Secondary | ICD-10-CM

## 2021-06-14 MED ORDER — NURTEC 75 MG PO TBDP
75.0000 mg | ORAL_TABLET | Freq: Every day | ORAL | 0 refills | Status: DC | PRN
  Filled 2021-06-14: qty 8, 30d supply, fill #0

## 2021-06-15 ENCOUNTER — Other Ambulatory Visit (HOSPITAL_COMMUNITY): Payer: Self-pay

## 2021-06-19 ENCOUNTER — Encounter: Payer: Self-pay | Admitting: Internal Medicine

## 2021-06-21 ENCOUNTER — Other Ambulatory Visit (HOSPITAL_COMMUNITY): Payer: Self-pay

## 2021-06-26 ENCOUNTER — Encounter: Payer: Self-pay | Admitting: Family

## 2021-06-26 ENCOUNTER — Other Ambulatory Visit (HOSPITAL_COMMUNITY): Payer: Self-pay

## 2021-06-29 ENCOUNTER — Other Ambulatory Visit: Payer: No Typology Code available for payment source

## 2021-06-29 DIAGNOSIS — Z136 Encounter for screening for cardiovascular disorders: Secondary | ICD-10-CM

## 2021-06-29 DIAGNOSIS — E890 Postprocedural hypothyroidism: Secondary | ICD-10-CM

## 2021-06-29 DIAGNOSIS — R7309 Other abnormal glucose: Secondary | ICD-10-CM

## 2021-06-29 DIAGNOSIS — K911 Postgastric surgery syndromes: Secondary | ICD-10-CM

## 2021-06-29 DIAGNOSIS — R030 Elevated blood-pressure reading, without diagnosis of hypertension: Secondary | ICD-10-CM

## 2021-06-29 DIAGNOSIS — D508 Other iron deficiency anemias: Secondary | ICD-10-CM

## 2021-06-29 DIAGNOSIS — R5383 Other fatigue: Secondary | ICD-10-CM

## 2021-07-04 NOTE — Addendum Note (Signed)
Addended by: Angus Seller on: 07/04/2021 02:08 PM ? ? Modules accepted: Orders ? ?

## 2021-07-05 NOTE — Progress Notes (Signed)
61 y.o. G2P0112 Married Black or [place] American Not Hispanic or Latino female here for annual exam.  H/O hysterectomy, on divigel for ERT. She was increased from 1 gm to 1.25 gm in 9/22 secondary to worsening vasomotor symptoms. Not sexually active, husband with h/o prostate cancer.  ?  ?H/O DIV, she still has d/c, but it is better than it was. No vaginal irritation.  ? ?She has had a gastric bypass. ? ?Husband needs surgery on his ankle, prior failed ankle fusion.  ? ?No LMP recorded. Patient has had a hysterectomy.          ?Sexually active: No.  ?The current method of family planning is status post hysterectomy.    ?Exercising: No.  The patient does not participate in regular exercise at present. ?Smoker:  no ? ?Health Maintenance: ?Pap:   08/14/2018 Neg 02/14/12 WNL ?History of abnormal Pap:  no ?MMG:  05/26/21 density B Bi-rads 1 neg  ?BMD:    03/24/18 normal  ?Colonoscopy: 03/29/17 normal, f/u in 10 years  ?TDaP:  01/10/12 ?Gardasil: n/a ? ? reports that she has never smoked. She has never used smokeless tobacco. She reports that she does not drink alcohol and does not use drugs. 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 Twins daughters are 60, one local, other one is 1.5 hours away. 4 granddaughters. 19,12,11 and 22. 69 and 61 year old are local.  ? ?Past Medical History:  ?Diagnosis Date  ? Back pain   ? Bursitis   ? right shoulder  ? Chronic leukopenia   ? intermittant since 2010, followed by Dr. Renold Genta now  ? Constipation   ? Degenerative joint disease of low back 09/02/15   ? Dr. Maureen Ralphs  ? Edema of both lower legs   ? Fibrocystic breast   ? Gallbladder problem   ? GERD (gastroesophageal reflux disease)   ? Headache   ? per pt more stress/tension  ? Heart palpitations   ? SEE EPIC ENCOUTNER , CARDIOLOGY DR. Tressia Miners TURNER 2018; reports on 05-16-17 " i haven't had any bouts of those lately"    ? Hemorrhoids   ? History of Clostridium difficile infection 12/2010  ? History of exercise intolerance   ? ETT on  04-27-2016-- negative ([facility] treadmill score 7)  ? History of Helicobacter pylori infection 2001 and 09/ 2012  ? 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 Hx of adenomatous colonic polyps 10/17/2007  ? Hydradenitis   ? per pt currently treated with Humira injection  ? Hypothyroidism, postsurgical 1980  ? IBS (irritable bowel syndrome)   ? Joint pain   ? Medial meniscus tear   ? right knee  ? Osteoarthritis   ? Palpitations   ? Pernicious anemia   ? b12 def  ? PONV (postoperative nausea and vomiting)   ? 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 Prediabetes   ? Reactive hypoglycemia followed by pcp  ? post gastrectomy dumping syndrome  ? SOB (shortness of breath)   ? Tendonitis   ? right shoulder  ? Varicose veins   ? Vitamin B 12 deficiency   ? Vitamin D deficiency   ? ? ?Past Surgical History:  ?Procedure Laterality Date  ? BREATH TEK H PYLORI N/A 05/03/2014  ? Procedure: BREATH TEK H PYLORI;  Surgeon: Kaylyn Lim, MD;  Location: [facility];  Service: General;  Laterality: N/A;  ? CARDIOVASCULAR STRESS TEST  03/11/2013   dr Tressia Miners turner  ? Low risk nuclear study w/  a very small anterior perfusion defect that most likely represents breast attenuation artifact, less likely this could represent a true perfusion abnormality in the distribution of diagonal branch of the LAD/  normal LV function and wall motion , ef 66%  ? CATARACT EXTRACTION W/ INTRAOCULAR LENS  IMPLANT, BILATERAL  10/2017  ? [place]  ? CHOLECYSTECTOMY OPEN  1987  ? COLONOSCOPY  02/2017  ? Dr [name] ; per patient they found 7 polyps with polypectomy; on 5 year track   ? ESOPHAGOGASTRODUODENOSCOPY  02/01/2021  ? FINE NEEDLE ASPIRATION Left 05/22/2017  ? Procedure: LEFT KNEE ASPIRATION;  Surgeon: Gaynelle Arabian, MD;  Location: [facility];  Service: Orthopedics;  Laterality: Left;  ? GASTRIC ROUX-EN-Y N/A 07/06/2014  ? Procedure: LAPAROSCOPIC ROUX-EN-Y GASTRIC BYPASS, ENTEROLYSIS OF ADHESIONS WITH UPPER ENDOSCOPY;  Surgeon: Johnathan Hausen, MD;  Location:  [facility];  Service: General;  Laterality: N/A;  ? HAMMER TOE SURGERY Bilateral 02/17/2008  ? bilateral foot digit's 2 and 5  ? HIATAL HERNIA REPAIR N/A 07/06/2014  ? Procedure: LAPAROSCOPIC REPAIR OF HIATAL HERNIA;  Surgeon: Johnathan Hausen, MD;  Location: [facility];  Service: General;  Laterality: N/A;  ? I & D KNEE WITH POLY EXCHANGE Left 12/11/2019  ? Procedure: LEFT KNEE POLY-LINER EXCHANGE;  Surgeon: Mcarthur Rossetti, MD;  Location: [place];  Service: Orthopedics;  Laterality: Left;  ? KNEE ARTHROSCOPY Right 12/11/2019  ? Procedure: RIGHT KNEE ARTHROSCOPY WITH PARTIAL MEDIAL MENISCECTOMY;  Surgeon: Mcarthur Rossetti, MD;  Location: [place];  Service: Orthopedics;  Laterality: Right;  ? KNEE ARTHROSCOPY WITH MEDIAL MENISECTOMY Left 07/17/2017  ? Procedure: LEFT KNEE ARTHROSCOPY WITH MEDIAL LATERAL MENISECTOMY;  Surgeon: Gaynelle Arabian, MD;  Location: [facility];  Service: Orthopedics;  Laterality: Left;  ? MASS EXCISION N/A 10/11/2016  ? Procedure: EXCISION OF PERIANAL MASS;  Surgeon: [name], MD;  Location: [facility];  Service: General;  Laterality: N/A;  ? REFRACTIVE SURGERY    ? THYROIDECTOMY  1980  ? TOTAL ABDOMINAL HYSTERECTOMY  08-25-2002   dr Margaretha Glassing  ? ovaries retained  ? TOTAL HIP ARTHROPLASTY Right 05/21/2012  ? Procedure: TOTAL HIP ARTHROPLASTY;  Surgeon: Gearlean Alf, MD;  Location: [facility];  Service: Orthopedics;  Laterality: Right;  ? TOTAL HIP ARTHROPLASTY Left 01-31-2009  dr Wynelle Link  ? TOTAL HIP REVISION Left 05/22/2017  ? Procedure: Left hip bearing surface and head revision;  Surgeon: Gaynelle Arabian, MD;  Location: [facility];  Service: Orthopedics;  Laterality: Left;  ? TOTAL KNEE ARTHROPLASTY Left 01/20/2018  ? Procedure: LEFT TOTAL KNEE ARTHROPLASTY;  Surgeon: Gaynelle Arabian, MD;  Location: [facility];  Service: Orthopedics;  Laterality: Left;  68mn  ? TRANSTHORACIC ECHOCARDIOGRAM  05/03/2016  ? ef 55-60%/  trivial MR/  mild TR  ? TUBAL LIGATION    ? ? ?Current  Outpatient Medications  ?Medication Sig Dispense Refill  ? Adalimumab (HUMIRA PEN) 40 MG/0.8ML PNKT Inject 1 pen subcutaneously every week 4 each 5  ? albuterol (VENTOLIN HFA) 108 (90 Base) MCG/ACT inhaler Inhale 2 puffs into the lungs every 6  hours as needed for wheezing or shortness of breath. 8.5 g 0  ? ALPRAZolam (XANAX) 1 MG tablet Take one tablet by mouth at bedtime. 90 tablet 0  ? Calcium Carb-Cholecalciferol (CALCIUM + D3 PO) Take 1 tablet by mouth daily.    ? cyanocobalamin (,VITAMIN B-12,) 1000 MCG/ML injection INJECT 1 ML INTRAMUSCULARLY EVERY MONTH 3 mL 11  ? doxycycline (VIBRA-TABS) 100 MG tablet Take  1 tablet by mouth 2 times daily. 20 tablet 0  ? Estradiol (DIVIGEL) 1.25 MG/1.25GM GEL Use one packet daily as directed 112.5 g 2  ? flecainide (TAMBOCOR) 50 MG tablet Take 1 tablet (50 mg total) by mouth 2 (two) times daily. 180 tablet 3  ? gabapentin (NEURONTIN) 600 MG tablet TAKE 1 TABLET BY MOUTH 3 TIMES DAILY (Patient taking differently: Take by mouth as needed (PAIN).) 90 tablet 5  ? HYDROcodone bit-homatropine (HYCODAN) 5-1.5 MG/5ML syrup Take 5 mLs by mouth every 8 hours as needed for cough. 120 mL 0  ? levothyroxine (SYNTHROID) 112 MCG tablet TAKE 1 TABLET BY MOUTH ONCE A DAY 90 tablet 1  ? metoprolol succinate (TOPROL-XL) 25 MG 24 hr tablet TAKE 1 TABLET BY MOUTH DAILY. 90 tablet 3  ? Multiple Vitamin (MULTIVITAMIN WITH MINERALS) TABS tablet Take 1 tablet by mouth daily.    ? NURTEC 75 MG TBDP Dissolve one tablet by mouth daily as needed for migraine. Take as close to onset of migraine as possible. ONE DAILY MAXIMUM.  Must be seen for further refills. 8 tablet 0  ? pantoprazole (PROTONIX) 40 MG tablet Take 1 tablet by mouth daily 20 minutes before breakfast 90 tablet 3  ? predniSONE (STERAPRED UNI-PAK 21 TAB) 10 MG (21) TBPK tablet Follow package instructions tapering over 6 days. ?6-5-4-3-2-1 21 tablet 0  ? promethazine (PHENERGAN) 25 MG tablet TAKE 1 TABLET BY MOUTH EVERY 8 HOURS AS NEEDED FOR  NAUSEA AND/OR VOMITING 30 tablet 5  ? tiZANidine (ZANAFLEX) 4 MG capsule Take 1 capsule by mouth 3 times daily as needed for muscle spasms. 90 capsule 1  ? ?No current facility-administered medications for t

## 2021-07-06 ENCOUNTER — Other Ambulatory Visit (HOSPITAL_COMMUNITY): Payer: Self-pay

## 2021-07-06 LAB — LIPID PANEL
Cholesterol: 220 mg/dL — ABNORMAL HIGH (ref <200)
HDL: 89 mg/dL (ref >=50)
LDL Cholesterol (Calc): 113 mg/dL (calc) — ABNORMAL HIGH
Non-HDL Cholesterol (Calc): 131 mg/dL (calc) — ABNORMAL HIGH (ref <130)
Total CHOL/HDL Ratio: 2.5 (calc) (ref <5.0)
Triglycerides: 79 mg/dL (ref <150)

## 2021-07-06 LAB — COMPLETE METABOLIC PANEL WITH GFR
AG Ratio: 1.2 (calc) (ref 1.0–2.5)
ALT: 15 U/L (ref 6–29)
AST: 16 U/L (ref 10–35)
Albumin: 3.7 g/dL (ref 3.6–5.1)
Alkaline phosphatase (APISO): 48 U/L (ref 37–153)
BUN: 11 mg/dL (ref 7–25)
CO2: 28 mmol/L (ref 20–32)
Calcium: 8.5 mg/dL — ABNORMAL LOW (ref 8.6–10.4)
Chloride: 106 mmol/L (ref 98–110)
Creat: 0.94 mg/dL (ref 0.50–1.05)
Globulin: 3 g/dL (calc) (ref 1.9–3.7)
Glucose, Bld: 81 mg/dL (ref 65–99)
Potassium: 4.1 mmol/L (ref 3.5–5.3)
Sodium: 140 mmol/L (ref 135–146)
Total Bilirubin: 0.4 mg/dL (ref 0.2–1.2)
Total Protein: 6.7 g/dL (ref 6.1–8.1)
eGFR: 69 mL/min/{1.73_m2} (ref >=60)

## 2021-07-06 LAB — CBC WITH DIFFERENTIAL/PLATELET
Absolute Monocytes: 287 cells/uL (ref 200–950)
Basophils Absolute: 9 cells/uL (ref 0–200)
Basophils Relative: 0.3 %
Eosinophils Absolute: 119 cells/uL (ref 15–500)
Eosinophils Relative: 4.1 %
HCT: 37.3 % (ref 35.0–45.0)
Hemoglobin: 12.1 g/dL (ref 11.7–15.5)
Lymphs Abs: 1331 cells/uL (ref 850–3900)
MCH: 32.8 pg (ref 27.0–33.0)
MCHC: 32.4 g/dL (ref 32.0–36.0)
MCV: 101.1 fL — ABNORMAL HIGH (ref 80.0–100.0)
MPV: 12 fL (ref 7.5–12.5)
Monocytes Relative: 9.9 %
Neutro Abs: 1154 cells/uL — ABNORMAL LOW (ref 1500–7800)
Neutrophils Relative %: 39.8 %
Platelets: 214 10*3/uL (ref 140–400)
RBC: 3.69 10*6/uL — ABNORMAL LOW (ref 3.80–5.10)
RDW: 11.9 % (ref 11.0–15.0)
Total Lymphocyte: 45.9 %
WBC: 2.9 10*3/uL — ABNORMAL LOW (ref 3.8–10.8)

## 2021-07-06 LAB — RETICULOCYTES

## 2021-07-06 LAB — B12 AND FOLATE PANEL
Folate: 8.8 ng/mL
Vitamin B-12: 432 pg/mL (ref 200–1100)

## 2021-07-06 LAB — IRON, TOTAL/TOTAL IRON BINDING CAP
%SAT: 40 % (calc) (ref 16–45)
Iron: 135 ug/dL (ref 45–160)
TIBC: 341 mcg/dL (calc) (ref 250–450)

## 2021-07-06 LAB — TSH: TSH: 1.91 mIU/L (ref 0.40–4.50)

## 2021-07-06 MED ORDER — MOXIFLOXACIN HCL 0.5 % OP SOLN
OPHTHALMIC | 0 refills | Status: DC
  Filled 2021-07-06: qty 3, 7d supply, fill #0

## 2021-07-07 ENCOUNTER — Encounter: Payer: Self-pay | Admitting: Internal Medicine

## 2021-07-07 ENCOUNTER — Ambulatory Visit (INDEPENDENT_AMBULATORY_CARE_PROVIDER_SITE_OTHER): Payer: No Typology Code available for payment source | Admitting: Internal Medicine

## 2021-07-07 VITALS — BP 108/80 | HR 68 | Temp 97.8°F | Ht 67.0 in | Wt 283.5 lb

## 2021-07-07 DIAGNOSIS — Z86010 Personal history of colonic polyps: Secondary | ICD-10-CM

## 2021-07-07 DIAGNOSIS — E890 Postprocedural hypothyroidism: Secondary | ICD-10-CM

## 2021-07-07 DIAGNOSIS — E538 Deficiency of other specified B group vitamins: Secondary | ICD-10-CM

## 2021-07-07 DIAGNOSIS — Z8669 Personal history of other diseases of the nervous system and sense organs: Secondary | ICD-10-CM

## 2021-07-07 DIAGNOSIS — K219 Gastro-esophageal reflux disease without esophagitis: Secondary | ICD-10-CM

## 2021-07-07 DIAGNOSIS — K909 Intestinal malabsorption, unspecified: Secondary | ICD-10-CM

## 2021-07-07 DIAGNOSIS — Z6841 Body Mass Index (BMI) 40.0 and over, adult: Secondary | ICD-10-CM | POA: Diagnosis not present

## 2021-07-07 DIAGNOSIS — Z9049 Acquired absence of other specified parts of digestive tract: Secondary | ICD-10-CM

## 2021-07-07 DIAGNOSIS — R82998 Other abnormal findings in urine: Secondary | ICD-10-CM | POA: Diagnosis not present

## 2021-07-07 DIAGNOSIS — Z9884 Bariatric surgery status: Secondary | ICD-10-CM | POA: Diagnosis not present

## 2021-07-07 DIAGNOSIS — Z0000 Encounter for general adult medical examination without abnormal findings: Secondary | ICD-10-CM

## 2021-07-07 DIAGNOSIS — R5383 Other fatigue: Secondary | ICD-10-CM

## 2021-07-07 DIAGNOSIS — R319 Hematuria, unspecified: Secondary | ICD-10-CM | POA: Diagnosis not present

## 2021-07-07 DIAGNOSIS — R112 Nausea with vomiting, unspecified: Secondary | ICD-10-CM | POA: Diagnosis not present

## 2021-07-07 DIAGNOSIS — D508 Other iron deficiency anemias: Secondary | ICD-10-CM

## 2021-07-07 DIAGNOSIS — L732 Hidradenitis suppurativa: Secondary | ICD-10-CM

## 2021-07-07 DIAGNOSIS — Z96643 Presence of artificial hip joint, bilateral: Secondary | ICD-10-CM

## 2021-07-07 DIAGNOSIS — K911 Postgastric surgery syndromes: Secondary | ICD-10-CM

## 2021-07-07 LAB — POCT URINALYSIS DIPSTICK
Bilirubin, UA: NEGATIVE
Glucose, UA: NEGATIVE
Ketones, UA: NEGATIVE
Nitrite, UA: NEGATIVE
Protein, UA: POSITIVE — AB
Spec Grav, UA: 1.02 (ref 1.010–1.025)
Urobilinogen, UA: 0.2 E.U./dL
pH, UA: 5 (ref 5.0–8.0)

## 2021-07-07 NOTE — Progress Notes (Signed)
Subjective:    Patient ID: Gevena [name], female    DOB: 1960/06/28, 61 y.o.   MRN: [id number]  HPI 61 year old Female seen for health maintenance exam and evaluation of medical issues. Working 30 hours  a week at [employer].  She complains of feeling very tired.  History of gastric bypass surgery  at [facility].  After gastric bypass surgery she had reactive hypoglycemia in 2017.  She saw Dr. Dwyane Dee, endocrinologist and improved.  He started her on acarbose before breakfast and lunch and she did fairly well.  It took a little bit of time for this to improve and this is no longer an issue.  History of gastric bypass Roux-en-Y laparoscopic surgery by Dr. Hassell Done in 2016.  Weight before surgery was 296 pounds.  History of PAT seen by Dr. Golden Hurter.  History of iron deficiency anemia seen at Oncology.  This seems to be related to her gastric bypass surgery.  Longstanding history of benign ethnic neutropenia.  History of hypothyroidism treated with levothyroxine.  Has chronic back pain and has seen Dr. [name].  Back pain is worse when standing on her feet a lot at work.  Gets relief with sitting or lying.  History of iron deficiency.  In March 2022, ferritin was normal at 42.  In August 2020 her ferritin level was 5.  Received iron infusions through oncology.  Iron level in November 2021 was 88.  Iron level was normal at 124 in March 2022.  Iron level in August 2022 was normal at 105.  In December 2022 her iron level was 52.  Currently iron level is normal at 135.  This was drawn Jun 29, 2021.   Dr. Lyman Speller is GYN physician History of anxiety and insomnia treated with Xanax.  In March 2019 she had MRI of the brain for headaches.  Headaches can last up to 3 days per episode and was associated with some vertigo.  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  Headaches improved with conservative treatment.  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  History of migraine headaches seen by Dr. Jaynee Eagles at [facility] neurology.  History of iron deficiency seen by Oncology.  History of lumbosacral radiculopathy seen by Dr. Jaynee Eagles.  MRI of the LS spine in 2021 was essentially normal.  History of left peroneal neuropathy by EMG/nerve conduction study by Dr. Her in 2021  Past medical history: Had thyroidectomy in 1980 and is on thyroid replacement medication.  Cholecystectomy 1987.  Remote history of adenomatous colon polyp.  C-section for twin daughters in 66.  Bilateral tubal ligation in the 1980s.  Total abdominal hysterectomy 2004.  Had left hip arthroplasty December 2010.  Right hip arthroplasty March 2014.  Endoscopy for epigastric pain in 2012 by Dr. Olevia Perches.  Upper endoscopy in 2012 showed chronic duodenitis which was treated with PPI.  History of GE reflux.  Has had recurrent vaginitis issues seen by Dr. Edwinna Areola, GYN physician.  She saw Dr. Radford Pax in 2015 for chest pain.  2D echocardiogram was performed along with myocardial perfusion imaging which was normal.  In March 2018 she had exercise tolerance test which was normal.  Social history: First marriage ended in divorce.  She has remarried.  Husband is disabled.  She does not smoke or consume alcohol.  2 adult twin daughters.  Patient works full-time with   endoscopy as a Equities trader.  Family history: Mother living with history of diabetes and hypertension.  Father deceased with history of diabetes.  2 brothers and 1 sister.  Sister has hypothyroidism.  Bone density ordered by GYN physician in 2021 showed normal scores.  Had colonoscopy by Dr. [name] in 2019 with 1 adenomatous polyp removed and had several hyperplastic polyps.  Repeat study recommended in 2024.  History of hidradenitis suppurativa treated with Humira.  Had arthroscopy of right knee in October 2021 for history of  acute medial meniscal tear.  History of left knee arthroplasty  History of B12 deficiency and gives herself monthly B12 injections.  Review of Systems getting winded a lot. Being treated with metoprolol and Flecainide. Can't walk far without being winded. No chest pain. Getting iron infusions     Objective:   Physical Exam  She looks fatigued.  Blood pressure is 108/80 pulse 68 pulse oximetry 99% weight 283 pounds BMI 44.40 Skin: Warm and dry.  No cervical adenopathy.  No carotid bruits.  Chest clear.  Cardiac exam: Regular rate and rhythm abdomen soft nondistended without hepatosplenomegaly masses or tenderness.  No pitting edema of the lower extremities.  Neurologically she is intact without gross focal deficits     Assessment & Plan:  Fatigue  History of gastric bypass surgery 2016.  She weighed 296 pounds before surgery.  History of B12 deficiency and gives self IM B12 monthly  History of vitamin D deficiency-level was 18.9 in May 2022.  Recommend refilling vitamin D supplement weekly  History of migraine headaches seen by Dr. Jaynee Eagles  History of musculoskeletal pain  Remote history of iron deficiency and received IV iron infusions but not recently  Hidradenitis suppurativa on DMARD therapy consisting of Humira  Hypothyroidism with stable TSH on thyroid replacement.  History of thyroidectomy in 1980.  Cholecystectomy 1987.  Total abdominal hysterectomy 2004.  C-section for twin daughters in 45.  Right hip arthroplasty March 2014.  History of chronic duodenitis in 2012.  History of GE reflux.  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  History of left knee arthroplasty by Dr. Maureen Ralphs November 2019  History of left knee arthroscopy with medial and lateral meniscal debridement May 2019  Acute medial meniscal tear right repaired by by Dr. Zollie Beckers.  October 2021 also had left knee polyethylene liner exchange at the same time.  Plan:  She generally does not feel well.  I think we should consider having general surgery evaluate her gastric bypass situation.  We will start vitamin D supplementation.  TSH is stable on thyroid replacement medication.  Migraines are a recurrent issue.  Joints hurt.  It is hard to do her job.  Recommend CT of abdomen and pelvis for evaluation with history of inability to lose weight malaise and fatigue in the setting of gastric bypass surgery.

## 2021-07-07 NOTE — Patient Instructions (Addendum)
Check on referral to Endocrine to see if she has any metabolic issues related to gastric bypass also, refer to Dr. Greer Pickerel, [facility]. Having issues with reactive hypoglycemia and is concerned about her gastric bypass surgery done in 2016.  Continue B12 injections monthly.  Recommend oral vitamin D weekly.

## 2021-07-08 LAB — RETICULOCYTES
ABS Retic: 56550 cells/uL (ref 20000–80000)
Retic Ct Pct: 1.5 %

## 2021-07-08 LAB — URINE CULTURE
MICRO NUMBER:: 13390155
Result:: NO GROWTH
SPECIMEN QUALITY:: ADEQUATE

## 2021-07-12 ENCOUNTER — Encounter: Payer: Self-pay | Admitting: Family

## 2021-07-12 ENCOUNTER — Ambulatory Visit (INDEPENDENT_AMBULATORY_CARE_PROVIDER_SITE_OTHER): Payer: No Typology Code available for payment source | Admitting: Obstetrics and Gynecology

## 2021-07-12 ENCOUNTER — Inpatient Hospital Stay: Payer: No Typology Code available for payment source | Admitting: Family

## 2021-07-12 ENCOUNTER — Telehealth: Payer: Self-pay

## 2021-07-12 ENCOUNTER — Telehealth: Payer: Self-pay | Admitting: *Deleted

## 2021-07-12 ENCOUNTER — Other Ambulatory Visit (HOSPITAL_COMMUNITY): Payer: Self-pay

## 2021-07-12 ENCOUNTER — Encounter: Payer: Self-pay | Admitting: Obstetrics and Gynecology

## 2021-07-12 ENCOUNTER — Inpatient Hospital Stay: Payer: No Typology Code available for payment source | Attending: Hematology & Oncology

## 2021-07-12 VITALS — BP 122/79 | HR 66 | Temp 98.4°F | Resp 18 | Wt 284.1 lb

## 2021-07-12 VITALS — BP 128/72 | HR 88 | Wt 280.4 lb

## 2021-07-12 DIAGNOSIS — Z79890 Hormone replacement therapy (postmenopausal): Secondary | ICD-10-CM

## 2021-07-12 DIAGNOSIS — B3731 Acute candidiasis of vulva and vagina: Secondary | ICD-10-CM

## 2021-07-12 DIAGNOSIS — Z23 Encounter for immunization: Secondary | ICD-10-CM

## 2021-07-12 DIAGNOSIS — N951 Menopausal and female climacteric states: Secondary | ICD-10-CM | POA: Diagnosis not present

## 2021-07-12 DIAGNOSIS — Z5181 Encounter for therapeutic drug level monitoring: Secondary | ICD-10-CM | POA: Diagnosis not present

## 2021-07-12 DIAGNOSIS — D508 Other iron deficiency anemias: Secondary | ICD-10-CM

## 2021-07-12 DIAGNOSIS — Z01419 Encounter for gynecological examination (general) (routine) without abnormal findings: Secondary | ICD-10-CM

## 2021-07-12 DIAGNOSIS — D509 Iron deficiency anemia, unspecified: Secondary | ICD-10-CM | POA: Diagnosis not present

## 2021-07-12 DIAGNOSIS — N898 Other specified noninflammatory disorders of vagina: Secondary | ICD-10-CM

## 2021-07-12 LAB — CBC WITH DIFFERENTIAL (CANCER CENTER ONLY)
Abs Immature Granulocytes: 0.01 10*3/uL (ref 0.00–0.07)
Basophils Absolute: 0 10*3/uL (ref 0.0–0.1)
Basophils Relative: 0 %
Eosinophils Absolute: 0.1 10*3/uL (ref 0.0–0.5)
Eosinophils Relative: 3 %
HCT: 35.7 % — ABNORMAL LOW (ref 36.0–46.0)
Hemoglobin: 11.8 g/dL — ABNORMAL LOW (ref 12.0–15.0)
Immature Granulocytes: 0 %
Lymphocytes Relative: 38 %
Lymphs Abs: 1.8 10*3/uL (ref 0.7–4.0)
MCH: 33.3 pg (ref 26.0–34.0)
MCHC: 33.1 g/dL (ref 30.0–36.0)
MCV: 100.8 fL — ABNORMAL HIGH (ref 80.0–100.0)
Monocytes Absolute: 0.4 10*3/uL (ref 0.1–1.0)
Monocytes Relative: 9 %
Neutro Abs: 2.3 10*3/uL (ref 1.7–7.7)
Neutrophils Relative %: 50 %
Platelet Count: 201 10*3/uL (ref 150–400)
RBC: 3.54 MIL/uL — ABNORMAL LOW (ref 3.87–5.11)
RDW: 13.6 % (ref 11.5–15.5)
WBC Count: 4.8 10*3/uL (ref 4.0–10.5)
nRBC: 0 % (ref 0.0–0.2)

## 2021-07-12 LAB — WET PREP FOR TRICH, YEAST, CLUE

## 2021-07-12 LAB — FERRITIN: Ferritin: 13 ng/mL (ref 11–307)

## 2021-07-12 MED ORDER — FLUCONAZOLE 150 MG PO TABS
150.0000 mg | ORAL_TABLET | Freq: Once | ORAL | 0 refills | Status: AC
  Filled 2021-07-12: qty 2, 4d supply, fill #0

## 2021-07-12 MED ORDER — ESTRADIOL 1.25 MG/1.25GM TD GEL
TRANSDERMAL | 3 refills | Status: DC
  Filled 2021-07-12: qty 112.5, 90d supply, fill #0
  Filled 2022-02-12 – 2022-02-15 (×3): qty 112.5, 90d supply, fill #1
  Filled 2022-05-31 (×2): qty 112.5, 90d supply, fill #2

## 2021-07-12 NOTE — Telephone Encounter (Signed)
The Nurtec authorization is effective for a maximum of 12 fill(s) from 07/12/2021 to 07/12/2022, as long as you are enrolled as a member of your current health plan. ? ?

## 2021-07-12 NOTE — Telephone Encounter (Signed)
Patient called and informed of her wet pep results. Informed that Diflucan was called to the pharmacy on file. Patient verbalized understanding.  ?

## 2021-07-12 NOTE — Progress Notes (Signed)
?Hematology and Oncology Follow Up Visit ? ?Victoria [name] ?[id number] ?1960/10/03 61 y.o. ?07/12/2021 ? ? ?Principle Diagnosis:  ?Iron deficiency anemia secondary to malabsorption, s/p gastric bypass in 2016  ?  ?Current Therapy:        ?IV iron as indicated  ?  ?Interim History:  Victoria [name] is here today for follow-up. She has sinus congestion and drainage. She states that she has an appointment with an allergist coming up.  ?She has mild SOB with exertion, occasional palpitations and notes fatigue.  ?No fever, chills, cough, rash, dizziness, chest pain, abdominal pain or changes in bowel or bladder habits.  ?No blood loss, bruising or petechiae.  ?She has mild swelling in her feet and ankles that waxes and wanes.  ?No numbness or tingling in her extremities.  ?No falls or syncope to report.  ?She has maintained a good appetite and is doing her best to stay well hydrated. Her weight is 284 lbs.  ? ?ECOG Performance Status: 1 - Symptomatic but completely ambulatory ? ?Medications:  ?Allergies as of 07/12/2021   ? ?   Reactions  ? Other Other (See Comments)  ? 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  ? Estradiol Rash, Other (See Comments)  ? Local rash with estradiol patches  ? ?  ? ?  ?Medication List  ?  ? ?  ? Accurate as of Jul 12, 2021  1:28 PM. If you have any questions, ask your nurse or doctor.  ?  ?  ? ?  ? ?STOP taking these medications   ? ?albuterol 108 (90 Base) MCG/ACT inhaler ?Commonly known as: VENTOLIN HFA ?Stopped by: Salvadore Dom, MD ?  ? ?  ? ?TAKE these medications   ? ?ALPRAZolam 1 MG tablet ?Commonly known as: Duanne Moron ?Take one tablet by mouth at bedtime. ?  ?CALCIUM + D3 PO ?Take 1 tablet by mouth daily. ?  ?Dodex 1000 MCG/ML injection ?Generic drug: cyanocobalamin ?INJECT 1 ML INTRAMUSCULARLY EVERY MONTH ?  ?Estradiol 1.25 MG/1.25GM Gel ?Commonly known as: Divigel ?Use one packet daily as directed ?  ?flecainide 50 MG tablet ?Commonly known as: TAMBOCOR ?Take 1  tablet (50 mg total) by mouth 2 (two) times daily. ?  ?gabapentin 600 MG tablet ?Commonly known as: NEURONTIN ?TAKE 1 TABLET BY MOUTH 3 TIMES DAILY ?What changed:  ?when to take this ?reasons to take this ?  ?Humira Pen 40 MG/0.8ML Pnkt ?Generic drug: Adalimumab ?Inject 1 pen subcutaneously every week ?  ?levothyroxine 112 MCG tablet ?Commonly known as: SYNTHROID ?TAKE 1 TABLET BY MOUTH ONCE A DAY ?  ?metoprolol succinate 25 MG 24 hr tablet ?Commonly known as: TOPROL-XL ?TAKE 1 TABLET BY MOUTH DAILY. ?  ?moxifloxacin 0.5 % ophthalmic solution ?Commonly known as: Vigamox ?Instill 1 drop into left eye twice a day ?  ?multivitamin with minerals Tabs tablet ?Take 1 tablet by mouth daily. ?  ?Nurtec 75 MG Tbdp ?Generic drug: Rimegepant Sulfate ?Dissolve one tablet by mouth daily as needed for migraine. Take as close to onset of migraine as possible. ONE DAILY MAXIMUM.  Must be seen for further refills. ?  ?pantoprazole 40 MG tablet ?Commonly known as: PROTONIX ?Take 1 tablet by mouth daily 20 minutes before breakfast ?  ?promethazine 25 MG tablet ?Commonly known as: PHENERGAN ?TAKE 1 TABLET BY MOUTH EVERY 8 HOURS AS NEEDED FOR NAUSEA AND/OR VOMITING ?  ?tiZANidine 4 MG capsule ?Commonly known as: Zanaflex ?Take 1 capsule by mouth 3 times daily as  needed for muscle spasms. ?  ? ?  ? ? ?Allergies:  ?Allergies  ?Allergen Reactions  ? Other Other (See Comments)  ?  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  ? Estradiol Rash and Other (See Comments)  ?  Local rash with estradiol patches  ? ? ?Past Medical History, Surgical history, Social history, and Family History were reviewed and updated. ? ?Review of Systems: ?All other 10 point review of systems is negative.  ? ?Physical Exam: ? vitals were not taken for this visit.  ? ?Wt Readings from Last 3 Encounters:  ?07/12/21 280 lb 6.4 oz (127.2 kg)  ?07/07/21 283 lb 8 oz (128.6 kg)  ?06/09/21 273 lb (123.8 kg)  ? ? ?Ocular: Sclerae unicteric, pupils equal, round  and reactive to light ?Ear-nose-throat: Oropharynx clear, dentition fair ?Lymphatic: No cervical or supraclavicular adenopathy ?Lungs no rales or rhonchi, good excursion bilaterally ?Heart regular rate and rhythm, no murmur appreciated ?Abd soft, nontender, positive bowel sounds ?MSK no focal spinal tenderness, no joint edema ?Neuro: non-focal, well-oriented, appropriate affect ?Breasts: Deferred  ? ?Lab Results  ?Component Value Date  ? WBC 4.8 07/12/2021  ? HGB 11.8 (L) 07/12/2021  ? HCT 35.7 (L) 07/12/2021  ? MCV 100.8 (H) 07/12/2021  ? PLT 201 07/12/2021  ? ?Lab Results  ?Component Value Date  ? FERRITIN 38 02/24/2021  ? IRON 135 06/29/2021  ? TIBC 341 06/29/2021  ? UIBC 315 02/24/2021  ? IRONPCTSAT 40 06/29/2021  ? ?Lab Results  ?Component Value Date  ? RETICCTPCT 1.5 07/07/2021  ? RBC 3.54 (L) 07/12/2021  ? RETICCTABS 56,550 07/07/2021  ? ?No results found for: KPAFRELGTCHN, LAMBDASER, KAPLAMBRATIO ?Lab Results  ?Component Value Date  ? IGGSERUM 1,609 (H) 05/16/2020  ? IGMSERUM 106 05/16/2020  ? ?No results found for: TOTALPROTELP, ALBUMINELP, A1GS, A2GS, BETS, BETA2SER, GAMS, MSPIKE, SPEI ?  Chemistry   ?   ?Component Value Date/Time  ? NA 140 06/29/2021 1105  ? K 4.1 06/29/2021 1105  ? CL 106 06/29/2021 1105  ? CO2 28 06/29/2021 1105  ? BUN 11 06/29/2021 1105  ? CREATININE 0.94 06/29/2021 1105  ?    ?Component Value Date/Time  ? CALCIUM 8.5 (L) 06/29/2021 1105  ? ALKPHOS 59 10/16/2018 0903  ? AST 16 06/29/2021 1105  ? AST 26 10/16/2018 0903  ? ALT 15 06/29/2021 1105  ? ALT 26 10/16/2018 0903  ? BILITOT 0.4 06/29/2021 1105  ? BILITOT 0.5 10/16/2018 1791  ?  ? ? ? ?Impression and Plan: Victoria [name] is a very pleasant 61 yo African American female with iron deficiency anemia secondary to malabsorption since laparoscopic gastric bypass in 2016.  ?Iron studies are pending.  ?Follow-up in 6 months.  ? ?Lottie Dawson, NP ?5/17/20231:28 PM  ?

## 2021-07-12 NOTE — Telephone Encounter (Signed)
[employer] [place], Key: [id number]. Your information has been sent to [place]. ?

## 2021-07-13 ENCOUNTER — Other Ambulatory Visit (HOSPITAL_COMMUNITY): Payer: Self-pay

## 2021-07-13 LAB — IRON AND IRON BINDING CAPACITY (CC-WL,HP ONLY)
Iron: 81 ug/dL (ref 28–170)
Saturation Ratios: 22 % (ref 10.4–31.8)
TIBC: 374 ug/dL (ref 250–450)
UIBC: 293 ug/dL (ref 148–442)

## 2021-07-19 ENCOUNTER — Other Ambulatory Visit (HOSPITAL_COMMUNITY): Payer: Self-pay

## 2021-07-20 ENCOUNTER — Other Ambulatory Visit (HOSPITAL_COMMUNITY): Payer: Self-pay

## 2021-07-20 ENCOUNTER — Telehealth: Payer: Self-pay | Admitting: Internal Medicine

## 2021-07-20 ENCOUNTER — Encounter: Payer: Self-pay | Admitting: Internal Medicine

## 2021-07-20 ENCOUNTER — Ambulatory Visit (INDEPENDENT_AMBULATORY_CARE_PROVIDER_SITE_OTHER): Payer: No Typology Code available for payment source | Admitting: Internal Medicine

## 2021-07-20 VITALS — BP 108/78 | HR 73 | Temp 100.3°F

## 2021-07-20 DIAGNOSIS — R059 Cough, unspecified: Secondary | ICD-10-CM

## 2021-07-20 DIAGNOSIS — R509 Fever, unspecified: Secondary | ICD-10-CM | POA: Diagnosis not present

## 2021-07-20 DIAGNOSIS — Z9884 Bariatric surgery status: Secondary | ICD-10-CM

## 2021-07-20 DIAGNOSIS — Z1383 Encounter for screening for respiratory disorder NEC: Secondary | ICD-10-CM | POA: Diagnosis not present

## 2021-07-20 DIAGNOSIS — J069 Acute upper respiratory infection, unspecified: Secondary | ICD-10-CM | POA: Diagnosis not present

## 2021-07-20 DIAGNOSIS — R5383 Other fatigue: Secondary | ICD-10-CM

## 2021-07-20 DIAGNOSIS — E538 Deficiency of other specified B group vitamins: Secondary | ICD-10-CM

## 2021-07-20 DIAGNOSIS — Z8639 Personal history of other endocrine, nutritional and metabolic disease: Secondary | ICD-10-CM

## 2021-07-20 MED ORDER — FLUCONAZOLE 150 MG PO TABS
ORAL_TABLET | ORAL | 1 refills | Status: DC
  Filled 2021-07-20: qty 1, 1d supply, fill #0

## 2021-07-20 MED ORDER — HYDROCODONE BIT-HOMATROP MBR 5-1.5 MG/5ML PO SOLN
5.0000 mL | Freq: Three times a day (TID) | ORAL | 0 refills | Status: DC | PRN
  Filled 2021-07-20: qty 120, 8d supply, fill #0

## 2021-07-20 MED ORDER — PREDNISONE 10 MG PO TABS
ORAL_TABLET | ORAL | 0 refills | Status: DC
Start: 2021-07-20 — End: 2021-07-26
  Filled 2021-07-20: qty 21, 6d supply, fill #0

## 2021-07-20 MED ORDER — AZITHROMYCIN 250 MG PO TABS
ORAL_TABLET | ORAL | 0 refills | Status: AC
  Filled 2021-07-20: qty 6, 5d supply, fill #0

## 2021-07-20 NOTE — Progress Notes (Signed)
   Subjective:    Patient ID: Victoria [name], female    DOB: [birth year], 61 y.o.   MRN: [id number]  HPI    Review of Systems     Objective:   Physical Exam        Assessment & Plan:

## 2021-07-20 NOTE — Telephone Encounter (Signed)
Victoria [name] [phone]  [name] called to say she started getting sick yesterday with Headache, Cough, chills, nasal drainage, yellow mucus, fatigue, she went ahead and done a COVID test it was negative. I scheduled her to come in at 4:30 today.

## 2021-07-20 NOTE — Progress Notes (Signed)
   Subjective:    Patient ID: Victoria [name], female    DOB: [birth year], 61 y.o.   MRN: [id number]  HPI 61 year old Female seen today for acute visit with sore throat, congestion and chills.  She does not know of anyone that she has been around that is ill.  She looks fatigued and simply does not feel well.  She saw hematologist in mid May for history of iron deficiency due to gastric bypass surgery in 2016.  She was having respiratory congestion at that time.  She has been referred to an allergist.  Appointment with allergist is not until late June.  Also would like for her to go back to [facility] for evaluation of gastric bypass.  She generally does not feel well.  Has iron deficiency anemia related to gastric bypass surgery in 2016.  Treated by nurse practitioner at [facility] in [facility].  Receives IV iron therapy.  History of paroxysmal atrial tachycardia seen by Dr. Radford Pax.  Sleep study was recommended in February.  Not sure that this has been completed yet.  In mid May, hemoglobin was 11.8 g.  This is stable.  MCV 100.8.  She has a history of B12 deficiency and gives herself B12 injections monthly.  Serum iron level in mid May was normal at 81.    Review of Systems see above-has respiratory symptoms malaise and fatigue     Objective:   Physical Exam Has  temp 100.3 degrees by ear thermometer, pulse oximetry 99% blood pressure 108/78 pulse 73 regular  Skin: Warm and dry.  TMs are clear.  Pharynx is clear.  Neck is supple.  Chest clear.       Assessment & Plan:  Acute respiratory infection-has febrile illness with respiratory congestion today.  Will be treated with Zithromax Z-PAK and Hycodan  History of gastric bypass surgery-referral to Dr. Greer Pickerel for review of prior surgery and make recommendations going forward.  History of iron deficiency-receives iron infusions at cancer center.  Iron level in early May was normal at  135  History of B12 deficiency-gives self B12 injections.  B12 level in early May was normal at 434.  Folate level was also normal.  Persistent respiratory symptoms-wonder if patient has allergic rhinitis and has upcoming allergy appointment in June as a new patient  Fatigue-etiology unclear  Hypothyroidism-TSH checked May 23 and is stable.  Pure hypercholesterolemia-total cholesterol 220 and LDL cholesterol 113.  Plan: Today I have given her a Zithromax Z-PAK 2 tabs day 1 followed by 1 tab days 2 through 5.  May take Diflucan 150 mg tablet if Candida vaginitis occurs while on antibiotics.  She will take prednisone 10 mg tablets in tapering course starting with 6 tablets day 1 and decreasing by 1 tablet daily i.e. 6-5-4-3-2-1.  Have prescribed Hycodan to take sparingly 1 teaspoon every 8 hours as needed for cough.  Rest and drink fluids.  Hypothyroidism

## 2021-07-24 NOTE — Patient Instructions (Addendum)
Patient will be treated with Zithromax Z-PAK today for respiratory infection 2 tabs day 1 followed by 1 tab days 2 through 5.  Will be prescribed Hycodan 1 teaspoon every 8 hours as needed for cough.  She will take prednisone in a tapering course as directed.  Suggest appointment with Dr. Greer Pickerel to review gastric bypass procedure that was done in the past and make recommendations regarding weight loss.  She is chronically fatigued.  B12 and iron levels are normal.  TSH recently checked and was stable on thyroid replacement medication.  Has pure hypercholesterolemia.  Has not wanted to take statin medication.  I have also asked her to check with [facility] for an appointment.  She will need to call [facility] for an appointment.  A referral has been placed to Endocrine to address metabolic issues with regard to gastric bypass surgery and hypothyroidism.  Allergy referral has been made  to Allergy and [place].

## 2021-07-25 ENCOUNTER — Encounter: Payer: Self-pay | Admitting: Family

## 2021-07-25 ENCOUNTER — Other Ambulatory Visit (HOSPITAL_COMMUNITY): Payer: Self-pay

## 2021-07-25 LAB — SARS-COV-2 RNA (COVID-19) RESP VIRAL PNL QL NAAT

## 2021-07-26 ENCOUNTER — Encounter: Payer: Self-pay | Admitting: Internal Medicine

## 2021-07-26 ENCOUNTER — Telehealth (INDEPENDENT_AMBULATORY_CARE_PROVIDER_SITE_OTHER): Payer: No Typology Code available for payment source | Admitting: Internal Medicine

## 2021-07-26 ENCOUNTER — Other Ambulatory Visit (HOSPITAL_COMMUNITY): Payer: Self-pay

## 2021-07-26 VITALS — BP 120/86 | HR 53 | Temp 97.5°F

## 2021-07-26 DIAGNOSIS — U071 COVID-19: Secondary | ICD-10-CM | POA: Diagnosis not present

## 2021-07-26 MED ORDER — NIRMATRELVIR/RITONAVIR (PAXLOVID)TABLET
3.0000 | ORAL_TABLET | Freq: Two times a day (BID) | ORAL | 0 refills | Status: AC
  Filled 2021-07-26: qty 30, 5d supply, fill #0

## 2021-07-26 MED ORDER — HYDROCODONE-ACETAMINOPHEN 5-325 MG PO TABS
1.0000 | ORAL_TABLET | Freq: Four times a day (QID) | ORAL | 0 refills | Status: DC | PRN
  Filled 2021-07-26: qty 20, 5d supply, fill #0

## 2021-07-26 MED ORDER — ERGOCALCIFEROL 1.25 MG (50000 UT) PO CAPS
50000.0000 [IU] | ORAL_CAPSULE | ORAL | 1 refills | Status: DC
  Filled 2021-07-26: qty 12, 84d supply, fill #0
  Filled 2021-11-01: qty 12, 84d supply, fill #1

## 2021-07-26 NOTE — Progress Notes (Signed)
   Subjective:    Patient ID: Victoria [name], female    DOB: 03/26/1960, 61 y.o.   MRN: [id number]  HPI 61 year old Female seen today by interactive audio and video telecommunications.  Patient is at her home and I am at my office.  She is identified using 2 identifiers as Victoria [name], a patient in this practice.  She is agreeable to visit in this format today.  Patient was seen here on Thursday, May 25.  At that time, she was not feeling well and was having malaise and fatigue.  She had some coryza and nasal congestion.  She had a headache, discolored sputum production and fatigue.  Symptoms had onset on Wednesday, May 24.  She also had chills.  She did a home COVID test and it was negative.  Records indicates she had 3 COVID vaccines in 2021.  Patient works as a Marine scientist at [employer].  During her in person visit on Thursday, May 25, she was felt to have a respiratory infection and was treated with Hycodan 1 teaspoon every 8 hours as needed for cough, Diflucan 150 mg tablet for Candida vaginitis while on antibiotics, and was prescribed a Zithromax Z-PAK.  Respiratory virus panel was obtained but results were not available until yesterday.  Results were positive for COVID-19 virus infection.  Patient says she is not feeling any better despite conservative treatment with above medications.  She is on DMARD for hidradenitis.  She has a history of hypothyroidism treated with thyroid replacement medication and history of gastric bypass surgery for weight loss.  History of PAT seen by Dr. Radford Pax.  History of migraine headaches treated by Dr. Jaynee Eagles with [facility].  Also has lumbar spinal stenosis and is treated with epidural steroid injections by [facility].  History of B12 deficiency and administers monthly B12 injections to herself.  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    Review of Systems see above-she has extreme fatigue.     Objective:    Physical Exam  Patient is seen virtually and appears to be fatigued.  She has nasal congestion.  Some cough.  Has had a headache that will not go away.  Does not appear to be in acute respiratory distress at this time.      Assessment & Plan:   Acute COVID-19 virus infection  Plan: Patient requesting Hydrocodone 5/325 APAP for headache.  She was given # 20 tablets to take 1 tablet every 6-8 hours as needed for headache.  She is to take Hycodan sparingly for cough.  She is to rest at home.  Monitor pulse oximetry.  Walk some to prevent atelectasis.  Try to stay well-hydrated.  She wants to try Paxlovid although symptoms started on Wednesday, May 24 and it may be a bit late to start Paxlovid we called in regular strength Paxlovid for her.  She will need to be out of work for minimum of 5 days.  She feels poorly and does not think she can go to work for the remainder of the week and will be provided a letter to be excused from work.

## 2021-07-26 NOTE — Patient Instructions (Addendum)
Paxlovid regular strength has been prescribed.  You may take hydrocodone APAP 5/325 sparingly for headache.  Walk some to prevent atelectasis.  Stay well-hydrated.  Monitor pulse oximetry.  Letter will be provided to remain out of work for the remainder of the week due to COVID-19 symptoms.

## 2021-07-26 NOTE — Telephone Encounter (Signed)
Victoria [name] called to say she got your message, she was at work yesterday. She is not feeling any better. She would also like to talk with you if possible.

## 2021-07-27 ENCOUNTER — Other Ambulatory Visit (HOSPITAL_COMMUNITY): Payer: Self-pay

## 2021-07-27 NOTE — Telephone Encounter (Signed)
Video visit yesterday

## 2021-07-31 ENCOUNTER — Encounter: Payer: Self-pay | Admitting: Internal Medicine

## 2021-07-31 ENCOUNTER — Encounter (HOSPITAL_COMMUNITY): Payer: Self-pay

## 2021-07-31 ENCOUNTER — Emergency Department (HOSPITAL_COMMUNITY)
Admission: EM | Admit: 2021-07-31 | Discharge: 2021-08-01 | Discharge status: Against Medical Advice | Payer: No Typology Code available for payment source | Attending: Emergency Medicine | Admitting: Emergency Medicine

## 2021-07-31 DIAGNOSIS — R531 Weakness: Secondary | ICD-10-CM | POA: Insufficient documentation

## 2021-07-31 DIAGNOSIS — Z8616 Personal history of COVID-19: Secondary | ICD-10-CM | POA: Diagnosis not present

## 2021-07-31 DIAGNOSIS — U071 COVID-19: Secondary | ICD-10-CM | POA: Insufficient documentation

## 2021-07-31 DIAGNOSIS — R109 Unspecified abdominal pain: Secondary | ICD-10-CM | POA: Insufficient documentation

## 2021-07-31 DIAGNOSIS — R0602 Shortness of breath: Secondary | ICD-10-CM | POA: Diagnosis not present

## 2021-07-31 DIAGNOSIS — R110 Nausea: Secondary | ICD-10-CM | POA: Diagnosis not present

## 2021-07-31 DIAGNOSIS — Z5321 Procedure and treatment not carried out due to patient leaving prior to being seen by health care provider: Secondary | ICD-10-CM | POA: Diagnosis not present

## 2021-07-31 DIAGNOSIS — R5381 Other malaise: Secondary | ICD-10-CM | POA: Diagnosis not present

## 2021-07-31 LAB — CBC WITH DIFFERENTIAL/PLATELET
Abs Immature Granulocytes: 0.02 10*3/uL (ref 0.00–0.07)
Basophils Absolute: 0 10*3/uL (ref 0.0–0.1)
Basophils Relative: 0 %
Eosinophils Absolute: 0.1 10*3/uL (ref 0.0–0.5)
Eosinophils Relative: 2 %
HCT: 44.1 % (ref 36.0–46.0)
Hemoglobin: 14.2 g/dL (ref 12.0–15.0)
Immature Granulocytes: 0 %
Lymphocytes Relative: 36 %
Lymphs Abs: 2 10*3/uL (ref 0.7–4.0)
MCH: 31.9 pg (ref 26.0–34.0)
MCHC: 32.2 g/dL (ref 30.0–36.0)
MCV: 99.1 fL (ref 80.0–100.0)
Monocytes Absolute: 0.7 10*3/uL (ref 0.1–1.0)
Monocytes Relative: 12 %
Neutro Abs: 2.8 10*3/uL (ref 1.7–7.7)
Neutrophils Relative %: 50 %
Platelets: 199 10*3/uL (ref 150–400)
RBC: 4.45 MIL/uL (ref 3.87–5.11)
RDW: 13.3 % (ref 11.5–15.5)
WBC: 5.6 10*3/uL (ref 4.0–10.5)
nRBC: 0 % (ref 0.0–0.2)

## 2021-07-31 NOTE — ED Triage Notes (Signed)
Pt arrived via POV, c/o states positive COVID test 5/30s cough, chills, abd pain (left side), no appetite, states sx started may 17th.

## 2021-07-31 NOTE — ED Provider Triage Note (Signed)
Emergency Medicine Provider Triage Evaluation Note  Victoria [name] , a 61 y.o. female  was evaluated in triage.  Pt complains of COVID-positive, abdominal pain.  Patient states that her symptoms began on 5/24.  Patient took home COVID test at this time however it was negative.  Patient had 3 COVID vaccines in 2021.  Patient was advised to begin treatment with Hycodan, Diflucan for Candida vaginitis, given Z-Pak.  Patient had positive COVID-19 result at this time.  Patient currently on DMARDs for hidradenitis suppurativa.  Patient states reason for visit is due to worsening left flank pain.  Review of Systems  Positive:  Negative:   Physical Exam  BP (!) 121/95 (BP Location: Left Arm)   Pulse 71   Temp 98.1 F (36.7 C) (Oral)   Resp 18   Ht '5\' 7"'$  (1.702 m)   Wt 119.7 kg   SpO2 100%   BMI 41.35 kg/m  Gen:   Awake, no distress   Resp:  Normal effort  MSK:   Moves extremities without difficulty  Other:    Medical Decision Making  Medically screening exam initiated at 4:21 PM.  Appropriate orders placed.  Shamekia Ranell Patrick was informed that the remainder of the evaluation will be completed by another provider, this initial triage assessment does not replace that evaluation, and the importance of remaining in the ED until their evaluation is complete.     Azucena Cecil, PA-C 07/31/21 1624

## 2021-08-01 ENCOUNTER — Emergency Department (HOSPITAL_COMMUNITY): Payer: No Typology Code available for payment source

## 2021-08-01 ENCOUNTER — Encounter (HOSPITAL_COMMUNITY): Payer: Self-pay

## 2021-08-01 ENCOUNTER — Emergency Department (HOSPITAL_COMMUNITY)
Admission: EM | Admit: 2021-08-01 | Discharge: 2021-08-01 | Disposition: A | Discharge status: Home / Self Care | Payer: No Typology Code available for payment source | Source: Home / Self Care | Attending: Emergency Medicine | Admitting: Emergency Medicine

## 2021-08-01 DIAGNOSIS — R531 Weakness: Secondary | ICD-10-CM | POA: Insufficient documentation

## 2021-08-01 DIAGNOSIS — R109 Unspecified abdominal pain: Secondary | ICD-10-CM | POA: Insufficient documentation

## 2021-08-01 DIAGNOSIS — R110 Nausea: Secondary | ICD-10-CM | POA: Insufficient documentation

## 2021-08-01 DIAGNOSIS — Z8616 Personal history of COVID-19: Secondary | ICD-10-CM | POA: Insufficient documentation

## 2021-08-01 DIAGNOSIS — R5381 Other malaise: Secondary | ICD-10-CM

## 2021-08-01 DIAGNOSIS — R0602 Shortness of breath: Secondary | ICD-10-CM | POA: Insufficient documentation

## 2021-08-01 LAB — CBC WITH DIFFERENTIAL/PLATELET
Abs Immature Granulocytes: 0.01 10*3/uL (ref 0.00–0.07)
Basophils Absolute: 0 10*3/uL (ref 0.0–0.1)
Basophils Relative: 1 %
Eosinophils Absolute: 0.1 10*3/uL (ref 0.0–0.5)
Eosinophils Relative: 2 %
HCT: 41.5 % (ref 36.0–46.0)
Hemoglobin: 13.6 g/dL (ref 12.0–15.0)
Immature Granulocytes: 0 %
Lymphocytes Relative: 31 %
Lymphs Abs: 1.3 10*3/uL (ref 0.7–4.0)
MCH: 32.5 pg (ref 26.0–34.0)
MCHC: 32.8 g/dL (ref 30.0–36.0)
MCV: 99 fL (ref 80.0–100.0)
Monocytes Absolute: 0.4 10*3/uL (ref 0.1–1.0)
Monocytes Relative: 10 %
Neutro Abs: 2.5 10*3/uL (ref 1.7–7.7)
Neutrophils Relative %: 56 %
Platelets: 217 10*3/uL (ref 150–400)
RBC: 4.19 MIL/uL (ref 3.87–5.11)
RDW: 13.2 % (ref 11.5–15.5)
WBC: 4.4 10*3/uL (ref 4.0–10.5)
nRBC: 0 % (ref 0.0–0.2)

## 2021-08-01 LAB — COMPREHENSIVE METABOLIC PANEL
ALT: 15 U/L (ref 0–44)
AST: 16 U/L (ref 15–41)
Albumin: 3.8 g/dL (ref 3.5–5.0)
Alkaline Phosphatase: 54 U/L (ref 38–126)
Anion gap: 9 (ref 5–15)
BUN: 11 mg/dL (ref 6–20)
CO2: 24 mmol/L (ref 22–32)
Calcium: 8.2 mg/dL — ABNORMAL LOW (ref 8.9–10.3)
Chloride: 107 mmol/L (ref 98–111)
Creatinine, Ser: 0.89 mg/dL (ref 0.44–1.00)
GFR, Estimated: >60 mL/min (ref >60)
Glucose, Bld: 97 mg/dL (ref 70–99)
Potassium: 3.7 mmol/L (ref 3.5–5.1)
Sodium: 140 mmol/L (ref 135–145)
Total Bilirubin: 0.7 mg/dL (ref 0.3–1.2)
Total Protein: 7.9 g/dL (ref 6.5–8.1)

## 2021-08-01 LAB — URINALYSIS, ROUTINE W REFLEX MICROSCOPIC
Bacteria, UA: NONE SEEN
Bilirubin Urine: NEGATIVE
Glucose, UA: NEGATIVE mg/dL
Ketones, ur: NEGATIVE mg/dL
Leukocytes,Ua: NEGATIVE
Nitrite: NEGATIVE
Protein, ur: NEGATIVE mg/dL
Specific Gravity, Urine: >1.046 — ABNORMAL HIGH (ref 1.005–1.030)
pH: 6 (ref 5.0–8.0)

## 2021-08-01 MED ORDER — SODIUM CHLORIDE 0.9 % IV SOLN: INTRAVENOUS | Status: DC

## 2021-08-01 MED ORDER — IOHEXOL 300 MG/ML  SOLN
100.0000 mL | Freq: Once | INTRAMUSCULAR | Status: AC | PRN
Start: 2021-08-01 — End: 2021-08-01
  Administered 2021-08-01: 100 mL via INTRAVENOUS

## 2021-08-01 MED ORDER — SODIUM CHLORIDE 0.9 % IV BOLUS
500.0000 mL | Freq: Once | INTRAVENOUS | Status: AC
  Administered 2021-08-01: 500 mL via INTRAVENOUS

## 2021-08-01 NOTE — Discharge Instructions (Signed)
The testing today does not show any complications from COVID.  It appears that you are recovering from it it is just taking a while.  Having had Paxlovid, you should do better as have most people who take it.  You may be mildly dehydrated.  Try to drink 3 to 4 glasses of water each day to improve your hydration and stamina.  Follow-up with your primary care doctor or return here as needed for problems.

## 2021-08-01 NOTE — ED Provider Triage Note (Signed)
Emergency Medicine Provider Triage Evaluation Note  Victoria [name] , a 61 y.o. female  was evaluated in triage.  Pt complains of abdominal pain.  Pt saw Dr. [name] today   Review of Systems  Positive: Abdominal pain  Negative: fever  Physical Exam  BP 111/85 (BP Location: Left Arm)   Pulse 70   Temp 98 F (36.7 C) (Oral)   Resp 16   SpO2 100%  Gen:   Awake, no distress   Resp:  Normal effort  MSK:   Moves extremities without difficulty  Other:    Medical Decision Making  Medically screening exam initiated at 2:05 PM.  Appropriate orders placed.  Victoria [name] was informed that the remainder of the evaluation will be completed by another provider, this initial triage assessment does not replace that evaluation, and the importance of remaining in the ED until their evaluation is complete.     Fransico Meadow, [place] 08/01/21 1406

## 2021-08-01 NOTE — ED Notes (Signed)
Dr. Wentz at the bedside.  

## 2021-08-01 NOTE — ED Provider Notes (Signed)
[facility] DEPT Provider Note   CSN: [id number] Arrival date & time: 08/01/21  1343     History  Chief Complaint  Patient presents with   Abdominal Pain    Victoria [name] is a 61 y.o. female.  HPI Patient presenting for multiple symptoms, after diagnosed with COVID, about 10 days ago.  She completed course of Paxlovid, yesterday.  She also has completed a course of antibiotics, initially prescribed about 10 days ago.  At this time she is having abdominal pain, nausea, shortness of breath and general weakness.  She came to the ED last night waited 10 hours but was not seen.  She decided come back today to be seen.  She is with her husband in the ED.  She has chronic illnesses which are multiple including thyroid disease, "multiple abscesses," treatment with immunosuppressive therapy, iron deficiency anemia, chronic back pain.    Home Medications Prior to Admission medications   Medication Sig Start Date End Date Taking? Authorizing Provider  Adalimumab (HUMIRA PEN) 40 MG/0.8ML PNKT Inject 1 pen subcutaneously every week 05/04/21   Tresa Garter, MD  ALPRAZolam Duanne Moron) 1 MG tablet Take one tablet by mouth at bedtime. 06/01/21 11/28/21  Elby Showers, MD  Calcium Carb-Cholecalciferol (CALCIUM + D3 PO) Take 1 tablet by mouth daily.    [provider]  cyanocobalamin (,VITAMIN B-12,) 1000 MCG/ML injection INJECT 1 ML INTRAMUSCULARLY EVERY MONTH 06/01/20 08/24/21  Elby Showers, MD  ergocalciferol (VITAMIN D2) 1.25 MG (50000 UT) capsule Take 1 capsule by mouth once a week. 07/26/21   Elby Showers, MD  Estradiol (DIVIGEL) 1.25 MG/1.25GM GEL Use one packet daily as directed 07/12/21   Salvadore Dom, MD  flecainide (TAMBOCOR) 50 MG tablet Take 1 tablet (50 mg total) by mouth 2 (two) times daily. 04/14/21   Sueanne Margarita, MD  fluconazole (DIFLUCAN) 150 MG tablet One 1 tablet by mouth for vaginal yeast. May refill this prescription one  time if needed. Patient not taking: Reported on 07/26/2021 07/20/21   Elby Showers, MD  gabapentin (NEURONTIN) 600 MG tablet TAKE 1 TABLET BY MOUTH 3 TIMES DAILY Patient taking differently: Take by mouth as needed (PAIN). 10/28/20 10/28/21  Elby Showers, MD  HYDROcodone bit-homatropine (HYCODAN) 5-1.5 MG/5ML syrup Take 5 mLs by mouth every 8 (eight) hours as needed for cough. 07/20/21   Elby Showers, MD  HYDROcodone-acetaminophen (NORCO/VICODIN) 5-325 MG tablet Take 1 tablet by mouth every 6 (six) hours as needed for moderate pain. 07/26/21   Elby Showers, MD  levothyroxine (SYNTHROID) 112 MCG tablet TAKE 1 TABLET BY MOUTH ONCE A DAY 06/01/21 06/01/22  Elby Showers, MD  metoprolol succinate (TOPROL-XL) 25 MG 24 hr tablet TAKE 1 TABLET BY MOUTH DAILY. 04/14/21   Sueanne Margarita, MD  moxifloxacin (VIGAMOX) 0.5 % ophthalmic solution Instill 1 drop into left eye twice a day 07/06/21     Multiple Vitamin (MULTIVITAMIN WITH MINERALS) TABS tablet Take 1 tablet by mouth daily.    [provider]  NURTEC 75 MG TBDP Dissolve one tablet by mouth daily as needed for migraine. Take as close to onset of migraine as possible. ONE DAILY MAXIMUM.  Must be seen for further refills. 06/14/21   Melvenia Beam, MD  pantoprazole (PROTONIX) 40 MG tablet Take 1 tablet by mouth daily 20 minutes before breakfast 02/01/21     promethazine (PHENERGAN) 25 MG tablet TAKE 1 TABLET BY MOUTH EVERY 8 HOURS  AS NEEDED FOR NAUSEA AND/OR VOMITING 01/05/21 01/05/22  Elby Showers, MD  tiZANidine (ZANAFLEX) 4 MG capsule Take 1 capsule by mouth 3 times daily as needed for muscle spasms. 05/31/21   Elby Showers, MD      Allergies    Other and Estradiol    Review of Systems   Review of Systems  Physical Exam Updated Vital Signs BP (!) 123/106 (BP Location: Right Arm)   Pulse 80   Temp 97.9 F (36.6 C) (Oral)   Resp 15   SpO2 94%  Physical Exam Vitals and nursing note reviewed.  Constitutional:      General: She is not  in acute distress.    Appearance: She is well-developed. She is obese. She is not ill-appearing or diaphoretic.  HENT:     Head: Normocephalic and atraumatic.     Right Ear: External ear normal.     Left Ear: External ear normal.  Eyes:     Conjunctiva/sclera: Conjunctivae normal.     Pupils: Pupils are equal, round, and reactive to light.  Neck:     Trachea: Phonation normal.  Cardiovascular:     Rate and Rhythm: Normal rate and regular rhythm.     Heart sounds: Normal heart sounds.  Pulmonary:     Effort: Pulmonary effort is normal. No respiratory distress.     Breath sounds: Normal breath sounds. No stridor. No wheezing or rhonchi.  Abdominal:     General: There is no distension.     Palpations: Abdomen is soft. There is no mass.     Tenderness: There is abdominal tenderness (Left upper and lower, mild). There is no guarding or rebound.  Musculoskeletal:        General: Normal range of motion.     Cervical back: Normal range of motion and neck supple.  Skin:    General: Skin is warm and dry.  Neurological:     Mental Status: She is alert and oriented to person, place, and time.     Cranial Nerves: No cranial nerve deficit.     Sensory: No sensory deficit.     Motor: No abnormal muscle tone.     Coordination: Coordination normal.  Psychiatric:        Mood and Affect: Mood normal.        Behavior: Behavior normal.        Thought Content: Thought content normal.        Judgment: Judgment normal.    ED Results / Procedures / Treatments   Labs (all labs ordered are listed, but only abnormal results are displayed) Labs Reviewed  COMPREHENSIVE METABOLIC PANEL - Abnormal; Notable for the following components:      Result Value   Calcium 8.2 (*)    All other components within normal limits  URINALYSIS, ROUTINE W REFLEX MICROSCOPIC - Abnormal; Notable for the following components:   Specific Gravity, Urine >1.046 (*)    Hgb urine dipstick MODERATE (*)    All other  components within normal limits  CBC WITH DIFFERENTIAL/PLATELET    EKG None  Radiology DG Chest 2 View  Result Date: 08/01/2021 CLINICAL DATA:  Cough and shortness of breath. EXAM: CHEST - 2 VIEW COMPARISON:  Chest x-ray dated May 31, 2021. FINDINGS: The heart size and mediastinal contours are within normal limits. Normal pulmonary vascularity. Mild bibasilar atelectasis. No focal consolidation, pleural effusion, or pneumothorax. No acute osseous abnormality. IMPRESSION: 1. Mild bibasilar atelectasis. Electronically Signed   By: Titus Dubin  M.D.   On: 08/01/2021 15:06   CT Abdomen Pelvis W Contrast  Result Date: 08/01/2021 CLINICAL DATA:  Pt presents with c/o abdominal pain that started over the weekend. Pt reports she has also had some nausea. EXAM: CT ABDOMEN AND PELVIS WITH CONTRAST TECHNIQUE: Multidetector CT imaging of the abdomen and pelvis was performed using the standard protocol following bolus administration of intravenous contrast. RADIATION DOSE REDUCTION: This exam was performed according to the departmental dose-optimization program which includes automated exposure control, adjustment of the mA and/or kV according to patient size and/or use of iterative reconstruction technique. CONTRAST:  162m OMNIPAQUE IOHEXOL 300 MG/ML  SOLN COMPARISON:  CT abdomen pelvis 05/24/2020 FINDINGS: Lower chest: No acute abnormality. Hepatobiliary: No focal liver abnormality. Status post cholecystectomy. No biliary dilatation. Pancreas: No focal lesion. Normal pancreatic contour. No surrounding inflammatory changes. No main pancreatic ductal dilatation. Spleen: Normal in size without focal abnormality. Adrenals/Urinary Tract: No adrenal nodule bilaterally. Bilateral kidneys enhance symmetrically. No hydronephrosis. No hydroureter. The urinary bladder is unremarkable. 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 Stomach is within normal limits. No evidence of bowel wall  thickening or dilatation. Appendix appears normal. Vascular/Lymphatic: No abdominal aorta or iliac aneurysm. No abdominal, pelvic, or inguinal lymphadenopathy. Reproductive: Limited evaluation due to streak artifact originating from bilateral femoral surgical hardware. Right pelvis likely tubal ligation clip noted. Status post hysterectomy. No adnexal masses. Other: No intraperitoneal free fluid. No intraperitoneal free gas. No organized fluid collection. Musculoskeletal: No abdominal wall hernia or abnormality. No suspicious lytic or blastic osseous lesions. No acute displaced fracture. Multilevel degenerative changes of the spine with endplate sclerosis at the T10-T11 and L5-S1 levels. Bilateral total hip arthroplasties partially visualized. IMPRESSION: 1. No acute intra-abdominal or intrapelvic abnormality in a patient status post Roux-en-Y gastric bypass, cholecystectomy, hysterectomy. 2. Slightly limited evaluation of the pelvis due to streak artifact originating from bilateral femoral surgical hardware. Electronically Signed   By: MIven FinnM.D.   On: 08/01/2021 18:55    Procedures Procedures    Medications Ordered in ED Medications  0.9 %  sodium chloride infusion (0 mLs Intravenous Hold 08/01/21 1952)  sodium chloride 0.9 % bolus 500 mL (500 mLs Intravenous New Bag/Given 08/01/21 1929)  iohexol (OMNIPAQUE) 300 MG/ML solution 100 mL (100 mLs Intravenous Contrast Given 08/01/21 1832)    ED Course/ Medical Decision Making/ A&P                           Medical Decision Making Patient with symptoms post-COVID infection, without distinct clinical entity.  Will screen for pneumonia, enteritis, UTI.  Problems Addressed: Malaise: complicated acute illness or injury    Details: Post COVID malaise, with nonspecific somatic complaints.  Screening evaluation is reassuring.  Amount and/or Complexity of Data Reviewed Independent Historian:     Details: She is a cogent historian Labs: ordered.     Details: CBC, metabolic panel, urinalysis-normal except calcium level, urine specific gravity high. Radiology: ordered and independent interpretation performed.    Details: CT abdomen pelvis-no acute abnormality  Risk Prescription drug management. Decision regarding hospitalization. Risk Details: Patient presenting with various complaints including weakness, abdominal pain and shortness of breath.  Screening evaluation is reassuring.  No sign of generalized bacterial infection, acute cardiac or pulmonary disorders or other signs of long COVID syndrome.  She appears to be mildly dehydrated likely from decreased oral intake.  She is encouraged to drink plenty of fluids, rest and follow-up with  her PCP for problems.           Final Clinical Impression(s) / ED Diagnoses Final diagnoses:  Malaise    Rx / DC Orders ED Discharge Orders     None         Daleen Bo, MD 08/01/21 2054

## 2021-08-01 NOTE — ED Notes (Signed)
Pt told registration staff that she was leaving.

## 2021-08-01 NOTE — ED Triage Notes (Signed)
Pt presents with c/o abdominal pain that started over the weekend. Pt reports she has also had some nausea. Pt was here yesterday for same but left because of the wait times.

## 2021-08-03 ENCOUNTER — Encounter: Payer: Self-pay | Admitting: Internal Medicine

## 2021-08-03 ENCOUNTER — Telehealth: Payer: Self-pay | Admitting: Internal Medicine

## 2021-08-03 NOTE — Telephone Encounter (Signed)
Called patient to follow up on recent ED visit and see how she is feeling. Got voice mail and left message for her to let us know how she is doing tomorrow. MJB, MD

## 2021-08-04 ENCOUNTER — Ambulatory Visit (HOSPITAL_BASED_OUTPATIENT_CLINIC_OR_DEPARTMENT_OTHER): Admission: RE | Admit: 2021-08-04 | Payer: No Typology Code available for payment source | Source: Ambulatory Visit

## 2021-08-14 ENCOUNTER — Other Ambulatory Visit (HOSPITAL_COMMUNITY): Payer: Self-pay

## 2021-08-16 ENCOUNTER — Telehealth: Payer: Self-pay | Admitting: *Deleted

## 2021-08-16 NOTE — Telephone Encounter (Signed)
Prior Authorization for [facility] sent to [place] via Phone.  NO PA REQ- REF# [id number].

## 2021-08-16 NOTE — Progress Notes (Signed)
GYNECOLOGY  VISIT   HPI: 61 y.o.   Married Black or [place] American Not Hispanic or Latino  female   [phone] with No LMP recorded. Patient has had a hysterectomy.   here for possible vaginitis. She is experiencing itching and burning, started ~4 days. She also has a discharge, thick.   She has a h/o hysterectomy. On divigel for ERT.   She was sick with Covid last month. Originally thought she had allergies, was treated with steroids and antibiotics.   H/O DIV was previously treated with vaginal steroids x 2  then clindamycin. The clindamycin helped, she finished that treatment ~1 year ago.   In 5/23 she was treated for yeast.   GYNECOLOGIC HISTORY: No LMP recorded. Patient has had a hysterectomy. Contraception:hysterectomy  Menopausal hormone therapy: yes Estradial         OB History     Gravida  2   Para  1   Term      Preterm  1   AB  1   Living  2      SAB      IAB  1   Ectopic      Multiple  1   Live Births  2              Patient Active Problem List   Diagnosis Date Noted   Spinal stenosis of lumbar region 07/07/2020   Unilateral primary osteoarthritis, right knee 06/08/2020   Displacement of lumbar intervertebral disc 06/02/2020   Lumbar spondylosis 06/02/2020   Lumbar stenosis with neurogenic claudication 06/02/2020   Arm numbness 05/30/2020   Chronic neck pain 05/30/2020   Other chronic pain 05/30/2020   Elevated blood-pressure reading, without diagnosis of hypertension 05/30/2020   Status post arthroscopy of right knee 12/11/2019   Polyethylene wear of left knee joint prosthesis ([place]) 09/09/2019   Acute medial meniscal tear, right, subsequent encounter 08/27/2019   History of total left knee replacement 07/16/2019   Peroneal neuropathy at knee, left 06/25/2019   Lumbosacral radiculopathy 06/07/2019   Iron malabsorption 10/16/2018   IDA (iron deficiency anemia) 10/16/2018   OA (osteoarthritis) of knee 01/20/2018   Acute meniscal tear,  medial 07/17/2017   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   Failed total hip arthroplasty, 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/2017   Pain in left knee 04/11/2017   Insomnia 11/08/2016   Pernicious anemia 11/08/2016   Leukopenia 07/09/2016   Hypocupremia 07/09/2016   Palpitations 04/12/2016   Reactive hypoglycemia 10/31/2015   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 Renold Genta now   Constipation    Degenerative joint disease of low back 09/02/15    Dr. Maureen Ralphs   Edema of both lower legs    Fibrocystic breast    Gallbladder problem    GERD (gastroesophageal reflux disease)    Headache    per pt more stress/tension   Heart palpitations    SEE EPIC ENCOUTNER , CARDIOLOGY DR. Tressia Miners TURNER 2018; reports on 05-16-17 " i haven't had any bouts  of those lately"     Hemorrhoids    History of Clostridium difficile infection 12/2010   History of exercise intolerance    ETT on 04-27-2016-- negative ([facility] treadmill score 7)   History of Helicobacter pylori infection 2001 and 09/ 2012   HSV-2 infection    genital   Hx of adenomatous colonic polyps 10/17/2007   Hydradenitis    per pt currently treated with Humira injection   Hypothyroidism, postsurgical 1980   IBS (irritable bowel syndrome)    Joint pain    Medial meniscus tear    right knee   Osteoarthritis    Palpitations    Pernicious anemia    b12 def   PONV (postoperative nausea and vomiting)    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   Prediabetes     Reactive hypoglycemia followed by pcp   post gastrectomy dumping syndrome   SOB (shortness of breath)    Tendonitis    right shoulder   Varicose veins    Vitamin B 12 deficiency    Vitamin D deficiency     Past Surgical History:  Procedure Laterality Date   BREATH TEK H PYLORI N/A 05/03/2014   Procedure: BREATH TEK H PYLORI;  Surgeon: Kaylyn Lim, MD;  Location: [facility];  Service: General;  Laterality: N/A;   CARDIOVASCULAR STRESS TEST  03/11/2013   dr Tressia Miners turner   Low risk nuclear study w/ a very small anterior perfusion defect that most likely represents breast attenuation artifact, less likely this could represent a true perfusion abnormality in the distribution of diagonal branch of the LAD/  normal LV function and wall motion , ef 66%   CATARACT EXTRACTION W/ INTRAOCULAR LENS  IMPLANT, BILATERAL  10/2017   [place]   COLONOSCOPY  02/2017   Dr [name] ; per patient they found 7 polyps with polypectomy; on 5 year track    ESOPHAGOGASTRODUODENOSCOPY  02/01/2021   FINE NEEDLE ASPIRATION Left 05/22/2017   Procedure: LEFT KNEE ASPIRATION;  Surgeon: Gaynelle Arabian, MD;  Location: [facility];  Service: Orthopedics;  Laterality: Left;   GASTRIC ROUX-EN-Y N/A 07/06/2014   Procedure: LAPAROSCOPIC ROUX-EN-Y GASTRIC BYPASS, ENTEROLYSIS OF ADHESIONS WITH UPPER ENDOSCOPY;  Surgeon: Johnathan Hausen, MD;  Location: [facility];  Service: General;  Laterality: N/A;   HAMMER TOE SURGERY Bilateral 02/17/2008   bilateral foot digit's 2 and 5   HIATAL HERNIA REPAIR N/A 07/06/2014   Procedure: LAPAROSCOPIC REPAIR OF HIATAL HERNIA;  Surgeon: Johnathan Hausen, MD;  Location: [facility];  Service: General;  Laterality: N/A;   I & D KNEE WITH POLY EXCHANGE Left 12/11/2019   Procedure: LEFT KNEE POLY-LINER EXCHANGE;  Surgeon: Mcarthur Rossetti, MD;  Location: [facility];  Service: Orthopedics;  Laterality: Left;   KNEE ARTHROSCOPY Right 12/11/2019   Procedure: RIGHT KNEE  ARTHROSCOPY WITH PARTIAL MEDIAL MENISCECTOMY;  Surgeon: Mcarthur Rossetti, MD;  Location: [facility];  Service: Orthopedics;  Laterality: Right;   KNEE ARTHROSCOPY WITH MEDIAL MENISECTOMY Left 07/17/2017   Procedure: LEFT KNEE ARTHROSCOPY WITH MEDIAL LATERAL MENISECTOMY;  Surgeon: Gaynelle Arabian, MD;  Location: [facility];  Service: Orthopedics;  Laterality: Left;   MASS EXCISION N/A 10/11/2016   Procedure: EXCISION OF PERIANAL MASS;  Surgeon: [name], MD;  Location: [facility];  Service: General;  Laterality: N/A;   [place]  08-25-2002   dr Margaretha Glassing  ovaries retained   TOTAL HIP ARTHROPLASTY Right 05/21/2012   Procedure: TOTAL HIP ARTHROPLASTY;  Surgeon: Gearlean Alf, MD;  Location: [facility];  Service: Orthopedics;  Laterality: Right;   TOTAL HIP ARTHROPLASTY Left 01-31-2009  dr Wynelle Link   TOTAL HIP REVISION Left 05/22/2017   Procedure: Left hip bearing surface and head revision;  Surgeon: Gaynelle Arabian, MD;  Location: [facility];  Service: Orthopedics;  Laterality: Left;   TOTAL KNEE ARTHROPLASTY Left 01/20/2018   Procedure: LEFT TOTAL KNEE ARTHROPLASTY;  Surgeon: Gaynelle Arabian, MD;  Location: [facility];  Service: Orthopedics;  Laterality: Left;  48mn   TRANSTHORACIC ECHOCARDIOGRAM  05/03/2016   ef 55-60%/  trivial MR/  mild TR   TUBAL LIGATION      Current Outpatient Medications  Medication Sig Dispense Refill   Adalimumab (HUMIRA PEN) 40 MG/0.8ML PNKT Inject 1 pen subcutaneously every week 4 each 5   ALPRAZolam (XANAX) 1 MG tablet Take one tablet by mouth at bedtime. 90 tablet 0   Calcium Carb-Cholecalciferol (CALCIUM + D3 PO) Take 1 tablet by mouth daily.     cyanocobalamin (,VITAMIN B-12,) 1000 MCG/ML injection INJECT 1 ML INTRAMUSCULARLY EVERY MONTH 3 mL 11   ergocalciferol (VITAMIN D2) 1.25 MG (50000 UT) capsule Take 1 capsule by mouth once a week. 12 capsule 1   Estradiol (DIVIGEL) 1.25 MG/1.25GM  GEL Use one packet daily as directed 112.5 g 3   flecainide (TAMBOCOR) 50 MG tablet Take 1 tablet (50 mg total) by mouth 2 (two) times daily. 180 tablet 3   gabapentin (NEURONTIN) 600 MG tablet TAKE 1 TABLET BY MOUTH 3 TIMES DAILY (Patient taking differently: Take by mouth as needed (PAIN).) 90 tablet 5   HYDROcodone bit-homatropine (HYCODAN) 5-1.5 MG/5ML syrup Take 5 mLs by mouth every 8 (eight) hours as needed for cough. 120 mL 0   HYDROcodone-acetaminophen (NORCO/VICODIN) 5-325 MG tablet Take 1 tablet by mouth every 6 (six) hours as needed for moderate pain. 20 tablet 0   levothyroxine (SYNTHROID) 112 MCG tablet TAKE 1 TABLET BY MOUTH ONCE A DAY 90 tablet 1   metoprolol succinate (TOPROL-XL) 25 MG 24 hr tablet TAKE 1 TABLET BY MOUTH DAILY. 90 tablet 3   Multiple Vitamin (MULTIVITAMIN WITH MINERALS) TABS tablet Take 1 tablet by mouth daily.     NURTEC 75 MG TBDP Dissolve one tablet by mouth daily as needed for migraine. Take as close to onset of migraine as possible. ONE DAILY MAXIMUM.  Must be seen for further refills. 8 tablet 0   pantoprazole (PROTONIX) 40 MG tablet Take 1 tablet by mouth daily 20 minutes before breakfast 90 tablet 3   promethazine (PHENERGAN) 25 MG tablet TAKE 1 TABLET BY MOUTH EVERY 8 HOURS AS NEEDED FOR NAUSEA AND/OR VOMITING 30 tablet 5   tiZANidine (ZANAFLEX) 4 MG capsule Take 1 capsule by mouth 3 times daily as needed for muscle spasms. 90 capsule 1   fluconazole (DIFLUCAN) 150 MG tablet One 1 tablet by mouth for vaginal yeast. May refill this prescription one time if needed. (Patient not taking: Reported on 07/26/2021) 1 tablet 1   No current facility-administered medications for this visit.   Facility-Administered Medications Ordered in Other Visits  Medication Dose Route Frequency Provider Last Rate Last Admin   gadobenate dimeglumine (MULTIHANCE) injection 20 mL  20 mL Intravenous Once PRN AMelvenia Beam MD         ALLERGIES: Other and Estradiol  Family  History  Problem Relation Age of Onset   Diabetes  Father    High blood pressure Father    High Cholesterol Father    Heart disease Father    Obesity Father    Diabetes Sister    Hypertension Sister    Hypertension Mother    Headache Mother    High Cholesterol Mother    Thyroid disease Mother    Obesity Mother     Social History   Socioeconomic History   Marital status: Married    Spouse name: Barbaraann Rondo   Number of children: 2   Years of education: Not on file   Highest education level: Not on file  Occupational History   Occupation: Therapist, sports  Tobacco Use   Smoking status: Never   Smokeless tobacco: Never  Vaping Use   Vaping Use: Never used  Substance and Sexual Activity   Alcohol use: No   Drug use: No   Sexual activity: Not Currently    Partners: Male    Birth control/protection: Surgical    Comment: hysterectomy  Other Topics Concern   Not on file  Social History Narrative   Lives at home with spouse   Right handed   Caffeine: diet zero mtn dew, occasionally    Social Determinants of Health   Financial Resource Strain: Not on file  Food Insecurity: Not on file  Transportation Needs: Not on file  Physical Activity: Not on file  Stress: Not on file  Social Connections: Not on file  Intimate Partner Violence: Not on file    Review of Systems  All other systems reviewed and are negative.   PHYSICAL EXAMINATION:    BP 110/70   Pulse 72   Ht '5\' 7"'$  (1.702 m)   Wt 280 lb (127 kg)   SpO2 100%   BMI 43.85 kg/m     General appearance: alert, cooperative and appears stated age  Pelvic: External genitalia:  no lesions              Urethra:  normal appearing urethra with no masses, tenderness or lesions              Bartholins and Skenes: normal                 Vagina: normal appearing vagina with an increase in thick, creamy, white vaginal d/c              Cervix: absent               Chaperone was present for exam.  1. Acute vaginitis - WET PREP FOR  TRICH, YEAST, CLUE  2. Yeast vaginitis - terconazole (TERAZOL 7) 0.4 % vaginal cream; Place 1 applicator vaginally at bedtime. One applicator full QHS for seven days of therapy  Dispense: 45 g; Refill: 0 - betamethasone valerate ointment (VALISONE) 0.1 %; Apply 1 Application topically 2 (two) times daily.  Dispense: 30 g; Refill: 0

## 2021-08-17 ENCOUNTER — Encounter: Payer: Self-pay | Admitting: Obstetrics and Gynecology

## 2021-08-17 ENCOUNTER — Ambulatory Visit (INDEPENDENT_AMBULATORY_CARE_PROVIDER_SITE_OTHER): Payer: No Typology Code available for payment source | Admitting: Obstetrics and Gynecology

## 2021-08-17 ENCOUNTER — Other Ambulatory Visit (HOSPITAL_COMMUNITY): Payer: Self-pay

## 2021-08-17 VITALS — BP 110/70 | HR 72 | Ht 67.0 in | Wt 280.0 lb

## 2021-08-17 DIAGNOSIS — B3731 Acute candidiasis of vulva and vagina: Secondary | ICD-10-CM

## 2021-08-17 DIAGNOSIS — N760 Acute vaginitis: Secondary | ICD-10-CM

## 2021-08-17 LAB — WET PREP FOR TRICH, YEAST, CLUE

## 2021-08-17 MED ORDER — TERCONAZOLE 0.4 % VA CREA
1.0000 | TOPICAL_CREAM | Freq: Every day | VAGINAL | 0 refills | Status: DC
  Filled 2021-08-17: qty 45, 7d supply, fill #0

## 2021-08-17 MED ORDER — BETAMETHASONE VALERATE 0.1 % EX OINT
1.0000 | TOPICAL_OINTMENT | Freq: Two times a day (BID) | CUTANEOUS | 0 refills | Status: DC
  Filled 2021-08-17: qty 30, 15d supply, fill #0

## 2021-08-18 ENCOUNTER — Other Ambulatory Visit (HOSPITAL_COMMUNITY): Payer: Self-pay

## 2021-08-21 ENCOUNTER — Other Ambulatory Visit (HOSPITAL_COMMUNITY): Payer: Self-pay

## 2021-08-21 NOTE — Progress Notes (Signed)
NEW PATIENT Date of Service/Encounter:  08/23/21 Referring provider: Elby Showers, MD Primary care provider: Elby Showers, MD  Subjective:  Victoria [name] is a 61 y.o. 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  She is having chronic rhinitis-losing voice certain times of the years, started this year in April, had similar issues in May but was diagnosed with Covid. Watery eyes, itchy eyes-eye drops help (systane complete) She also has drainage down back of her throat, raspy voice, coughing. Summer and Fall are worst seasons.  Xyzal makes her sleepy, zyrtec, claritin, and allegra do not seem to help. She hasn't done nasal sprays, but doesn't think she could tolerate these.  She does have reflux and takes lansoprazole 40 mg daily.  Cough is dry.  She feels drianage going down her throat and she feels this is triggering cough.  In April she was prescribed an albuterol inhaler when she was sick and wheezing and it helped.  She used it some during COVID when SOB but not outside of this. She feels coughing is worse when pollen counts are high. She did receive a round of antibiotics in May along with steroids and this did not help with the cough.  Helped some.  Of note, she is taking gabapentin 600 mg daily for neck and back pain.  Never been allergy tested.    Has a history of cough and nasal drainage. Seen by PCP 06/09/21-seen for allergic rhinitis, prescribed prednisone, hycodan.    History of metal contact dermatitis (hip replacement failed).  She reports getting toxic from her hip replacement and this was documented as  a metal allergy and she had a revision.  She denies any other symptoms with this, no overlying rash.  Revision in 2019 of one hip (bilateral  hips have been replaced) and then she had a total knee replacement and has had revision of left knee.    Other allergy screening: Food allergy: no Medication allergy: no; estardiol patch gave her a rash Hymenoptera allergy: no Eczema:no History of recurrent infections suggestive of immunodeficency: no Vaccinations are up to date.   Past Medical History: Past Medical History:  Diagnosis Date   Back pain    Bursitis    right shoulder   Chronic leukopenia    intermittant since 2010, followed by Dr. Renold Genta now   Constipation    Degenerative joint disease of low back 09/02/15    Dr. Maureen Ralphs   Edema of both lower legs    Fibrocystic breast    Gallbladder problem    GERD (gastroesophageal reflux disease)    Headache    per pt more stress/tension   Heart palpitations    SEE EPIC ENCOUTNER , CARDIOLOGY DR. Tressia Miners TURNER 2018; reports on 05-16-17 " i haven't had any bouts of those lately"     Hemorrhoids    History of Clostridium difficile infection 12/2010   History of exercise intolerance    ETT on 04-27-2016-- negative ([facility] treadmill score 7)   History of Helicobacter pylori infection 2001 and 09/ 2012   HSV-2 infection    genital   Hx of adenomatous colonic polyps 10/17/2007   Hydradenitis    per pt currently treated with Humira injection   Hypothyroidism, postsurgical 1980   IBS (irritable bowel syndrome)    Joint pain  Medial meniscus tear    right knee   Osteoarthritis    Palpitations    Pernicious anemia    b12 def   PONV (postoperative nausea and vomiting)    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 18 g 3   ALPRAZolam (XANAX) 1 MG tablet Take one tablet by mouth at bedtime. 90 tablet 0   betamethasone valerate ointment (VALISONE) 0.1 % Apply topically 2 times daily. 30 g 0   Calcium Carb-Cholecalciferol (CALCIUM + D3 PO) Take 1 tablet by mouth daily.     cyanocobalamin (,VITAMIN B-12,) 1000 MCG/ML injection INJECT 1 ML INTRAMUSCULARLY EVERY MONTH 3 mL 11   desloratadine (CLARINEX) 5 MG tablet Take 1 tablet (5 mg total) by mouth daily. 30 tablet 3   ergocalciferol (VITAMIN D2) 1.25 MG (50000 UT) capsule Take 1 capsule by mouth once a week. 12 capsule 1   Estradiol (DIVIGEL) 1.25 MG/1.25GM GEL Use one packet daily as directed 112.5 g 3   flecainide (TAMBOCOR) 50 MG tablet Take 1 tablet (50 mg total) by mouth 2 (two) times daily. 180 tablet 3   fluconazole (DIFLUCAN) 150 MG tablet One 1 tablet by mouth for vaginal yeast. May refill this prescription one time if needed. 1 tablet 1   gabapentin (NEURONTIN) 600 MG tablet TAKE 1 TABLET BY MOUTH 3 TIMES DAILY (Patient taking differently: Take by mouth as needed (PAIN).) 90 tablet 5   guaiFENesin (MUCINEX) 600 MG 12 hr tablet Take 1-2 tablets by mouth twice daily as needed for drainage.  Drink with lots of water 120 tablet 2   HYDROcodone bit-homatropine (HYCODAN) 5-1.5 MG/5ML syrup Take 5 mLs by mouth every 8 (eight) hours as needed for cough. 120 mL 0   HYDROcodone-acetaminophen (NORCO/VICODIN) 5-325 MG tablet Take 1 tablet by mouth every 6 (six) hours as needed for moderate pain. 20 tablet 0   levothyroxine (SYNTHROID) 112 MCG tablet TAKE 1 TABLET BY MOUTH ONCE A DAY 90 tablet 1   metoprolol succinate (TOPROL-XL) 25 MG 24 hr tablet TAKE 1 TABLET BY MOUTH DAILY. 90 tablet 3   montelukast (SINGULAIR) 10 MG tablet Take 1 tablet (10 mg total) by mouth at bedtime. 30  tablet 3   Multiple Vitamin (MULTIVITAMIN WITH MINERALS) TABS tablet Take 1 tablet by mouth daily.     NURTEC 75 MG TBDP Dissolve one tablet by mouth daily as needed for migraine. Take as close to onset of migraine as possible. ONE DAILY MAXIMUM.  Must be seen for further refills. 8 tablet 0   Olopatadine HCl 0.2 % SOLN Apply 1 drop to eye daily as needed. 2.5 mL 5   pantoprazole (PROTONIX) 40 MG tablet Take 1 tablet by mouth daily 20 minutes before breakfast 90 tablet 3   promethazine (PHENERGAN) 25 MG tablet TAKE 1 TABLET BY MOUTH EVERY 8 HOURS AS NEEDED FOR NAUSEA AND/OR VOMITING 30  tablet 5   terconazole (TERAZOL 7) 0.4 % vaginal cream Place 1 applicator vaginally at bedtime for 7 days of therapy 45 g 0   tiZANidine (ZANAFLEX) 4 MG capsule Take 1 capsule by mouth 3 times daily as needed for muscle spasms. 90 capsule 1   No current facility-administered medications for this visit.   Facility-Administered Medications Ordered in Other Visits  Medication Dose Route Frequency Provider Last Rate Last Admin   gadobenate dimeglumine (MULTIHANCE) injection 20 mL  20 mL Intravenous Once PRN Melvenia Beam, MD       Known Allergies:  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   Estradiol Rash and Other (See Comments)    Local rash with estradiol patches   Past Surgical History: Past Surgical History:  Procedure Laterality Date   BREATH TEK H PYLORI N/A 05/03/2014   Procedure: BREATH TEK H PYLORI;  Surgeon: Kaylyn Lim, MD;  Location: [facility];  Service: General;  Laterality: N/A;   CARDIOVASCULAR STRESS TEST  03/11/2013   dr Tressia Miners turner   Low risk nuclear study w/ a very small anterior perfusion defect that most likely represents breast attenuation artifact, less likely this could represent a true perfusion abnormality in the distribution of diagonal branch of the LAD/  normal LV function and wall motion , ef  66%   CATARACT EXTRACTION W/ INTRAOCULAR LENS  IMPLANT, BILATERAL  10/2017   [facility]   COLONOSCOPY  02/2017   Dr [name] ; per patient they found 7 polyps with polypectomy; on 5 year track    ESOPHAGOGASTRODUODENOSCOPY  02/01/2021   FINE NEEDLE ASPIRATION Left 05/22/2017   Procedure: LEFT KNEE ASPIRATION;  Surgeon: Gaynelle Arabian, MD;  Location: [facility];  Service: Orthopedics;  Laterality: Left;   GASTRIC ROUX-EN-Y N/A 07/06/2014   Procedure: LAPAROSCOPIC ROUX-EN-Y GASTRIC BYPASS, ENTEROLYSIS OF ADHESIONS WITH UPPER ENDOSCOPY;  Surgeon: Johnathan Hausen, MD;  Location: [facility];  Service: General;  Laterality: N/A;   HAMMER TOE SURGERY Bilateral 02/17/2008   bilateral foot digit's 2 and 5   HIATAL HERNIA REPAIR N/A 07/06/2014   Procedure: LAPAROSCOPIC REPAIR OF HIATAL HERNIA;  Surgeon: Johnathan Hausen, MD;  Location: [facility];  Service: General;  Laterality: N/A;   I & D KNEE WITH POLY EXCHANGE Left 12/11/2019   Procedure: LEFT KNEE POLY-LINER EXCHANGE;  Surgeon: Mcarthur Rossetti, MD;  Location: [facility];  Service: Orthopedics;  Laterality: Left;   KNEE ARTHROSCOPY Right 12/11/2019   Procedure: RIGHT KNEE ARTHROSCOPY WITH PARTIAL MEDIAL MENISCECTOMY;  Surgeon: Mcarthur Rossetti, MD;  Location: [facility];  Service: Orthopedics;  Laterality: Right;   KNEE ARTHROSCOPY WITH MEDIAL MENISECTOMY Left 07/17/2017   Procedure: LEFT KNEE ARTHROSCOPY WITH MEDIAL LATERAL MENISECTOMY;  Surgeon: Gaynelle Arabian, MD;  [facility];  Service: Orthopedics;  Laterality: Left;   MASS EXCISION N/A 10/11/2016   Procedure: EXCISION OF PERIANAL MASS;  Surgeon: [name], MD;  Location: [facility];  Service: General;  Laterality: N/A;   [place]  08-25-2002   dr Margaretha Glassing   ovaries retained   TOTAL HIP ARTHROPLASTY Right 05/21/2012   Procedure: TOTAL HIP ARTHROPLASTY;  Surgeon: Gearlean Alf, MD;  [facility];  Service: Orthopedics;  Laterality: Right;   TOTAL HIP ARTHROPLASTY Left 01-31-2009  dr Wynelle Link  TOTAL HIP REVISION Left 05/22/2017   Procedure: Left hip bearing surface and head revision;  Surgeon: Gaynelle Arabian, MD;  [facility];  Service: Orthopedics;  Laterality: Left;   TOTAL KNEE ARTHROPLASTY Left 01/20/2018   Procedure: LEFT TOTAL KNEE ARTHROPLASTY;  Surgeon: Gaynelle Arabian, MD;  [facility];  Service: Orthopedics;  Laterality: Left;  47mn   TRANSTHORACIC ECHOCARDIOGRAM  05/03/2016   ef 55-60%/  trivial MR/  mild TR   TUBAL LIGATION     Family History: Family History  Problem Relation Age of Onset   Diabetes Father    High blood pressure Father    High Cholesterol Father    Heart disease Father    Obesity Father    Diabetes Sister    Hypertension Sister    Hypertension Mother    Headache Mother    High Cholesterol Mother    Thyroid disease Mother    Obesity Mother    Social History: [place] house [place] 20 years ago, no water damage, vinyl plank floors, gas heating, central AC, no pets, no cockroaches, not using dust mite protection on bedding or pillows, no smoke exposure.  She is an RTherapist, [employer] department, exposed to dust and chemicals at work (automatic fragrances and bathrooms and patient perfumes), + HEPA filter[place]   ROS:  All other systems negative except as noted per HPI.  Objective:  Blood pressure 128/86, pulse 62, temperature 97.6 F (36.4 C), temperature source Temporal, height '5\' 7"'$  (1.702 m), weight 284 lb 6.4 oz (129 kg), SpO2 99 %. Body mass index is 44.54 kg/m. Physical Exam:  General Appearance:  Alert, cooperative, no distress, appears stated age  Head:  Normocephalic, without obvious abnormality, atraumatic  Eyes:  Conjunctiva clear, EOM's intact  Nose: Nares normal, hypertrophic turbinates, normal mucosa, and no visible anterior polyps  Throat: Lips, tongue  normal; teeth and gums normal, normal posterior oropharynx  Neck: Supple, symmetrical  Lungs:   clear to auscultation bilaterally, Respirations unlabored, no coughing  Heart:  regular rate and rhythm and no murmur, Appears well perfused  Extremities: No edema  Skin: Skin color, texture, turgor normal, no rashes or lesions on visualized portions of skin  Neurologic: No gross deficits   Diagnostics: Spirometry:  Tracings reviewed. Her effort: Good reproducible efforts. FVC: 2.30L  FEV1: 1.85L, 79% predicted FEV1/FVC ratio: 101%  Interpretation: Spirometry consistent with normal pattern   Skin Testing: Environmental allergy panel. Positive histamine control did not show up, patient did take promethazine yesterday.  Testing results nulled. Results discussed with patient/family.  Airborne Adult Perc - 08/23/21 1415     Time Antigen Placed 1420    Allergen Manufacturer GLavella Hammock   Location Back    Number of Test 59             Allergy testing results were read and interpreted by myself, documented by clinical staff.  Assessment and Plan   Chronic Rhinitis - suspect allergic: - allergy testing today was inaccurate, suspect due to recent use of promethazine - Start Singulair (Montelukast) '10mg'$  nightly.  Discontinue if having nightmares or behavior changes - Start Clarinex (desloratadine) 5 mg daily or daily as needed.   - Start mucinex 600 mg - 1200 mg (1 or 2 tablets) up to twice daily as needed for drainage. Drink with at least one glass of water when taking. Stay hydrated.   Chronic Conjunctivitis-suspect allergic:  - Consider Allergy Eye drops: great options include Pataday (Olopatadine)  or Zaditor (ketotifen) for eye symptoms daily as needed-both sold over the counter if not covered by insurance.   -Avoid eye drops that say red eye relief as they may contain medications that dry out your eyes.  Chronic cough:  - suspect secondary to post nasal drainage, reflux and possible  mild intermittent asthma - if cough continues, try albuterol 2 puffs and make note if helpful - pay attention to any reflux/heartburn symptoms to make sure this is also controlled - your breathing test today looked reassuring  Possible metal allergy: several failed joint replacements - consider metal patch testing in the future  Follow-up for repeat testing at your convenience.  Please stop all antihistamines including promethazine for at least 3 days prior to your testing.   This note in its entirety was forwarded to the Provider who requested this consultation.  Thank you for your kind referral. I appreciate the opportunity to take part in [facility] care. Please do not hesitate to contact me with questions.  Sincerely,  Sigurd Sos, MD [facility]

## 2021-08-23 ENCOUNTER — Encounter: Payer: Self-pay | Admitting: Family

## 2021-08-23 ENCOUNTER — Ambulatory Visit: Payer: No Typology Code available for payment source | Admitting: Family

## 2021-08-23 ENCOUNTER — Ambulatory Visit: Payer: No Typology Code available for payment source | Admitting: Internal Medicine

## 2021-08-23 ENCOUNTER — Other Ambulatory Visit: Payer: No Typology Code available for payment source

## 2021-08-23 ENCOUNTER — Other Ambulatory Visit (HOSPITAL_COMMUNITY): Payer: Self-pay

## 2021-08-23 ENCOUNTER — Encounter: Payer: Self-pay | Admitting: Internal Medicine

## 2021-08-23 VITALS — BP 128/86 | HR 62 | Temp 97.6°F | Ht 67.0 in | Wt 284.4 lb

## 2021-08-23 DIAGNOSIS — H1013 Acute atopic conjunctivitis, bilateral: Secondary | ICD-10-CM | POA: Diagnosis not present

## 2021-08-23 DIAGNOSIS — H10403 Unspecified chronic conjunctivitis, bilateral: Secondary | ICD-10-CM

## 2021-08-23 DIAGNOSIS — J310 Chronic rhinitis: Secondary | ICD-10-CM

## 2021-08-23 DIAGNOSIS — R053 Chronic cough: Secondary | ICD-10-CM

## 2021-08-23 DIAGNOSIS — K219 Gastro-esophageal reflux disease without esophagitis: Secondary | ICD-10-CM

## 2021-08-23 DIAGNOSIS — Z9109 Other allergy status, other than to drugs and biological substances: Secondary | ICD-10-CM

## 2021-08-23 MED ORDER — OLOPATADINE HCL 0.2 % OP SOLN
1.0000 [drp] | Freq: Every day | OPHTHALMIC | 5 refills | Status: DC | PRN
  Filled 2021-08-23: qty 2.5, 20d supply, fill #0
  Filled 2022-06-14: qty 2.5, 20d supply, fill #1

## 2021-08-23 MED ORDER — DESLORATADINE 5 MG PO TABS
5.0000 mg | ORAL_TABLET | Freq: Every day | ORAL | 3 refills | Status: DC
  Filled 2021-08-23: qty 30, 30d supply, fill #0

## 2021-08-23 MED ORDER — MONTELUKAST SODIUM 10 MG PO TABS
10.0000 mg | ORAL_TABLET | Freq: Every day | ORAL | 3 refills | Status: DC
  Filled 2021-08-23: qty 30, 30d supply, fill #0
  Filled 2021-12-14: qty 30, 30d supply, fill #1
  Filled 2022-01-30: qty 30, 30d supply, fill #2
  Filled 2022-03-20: qty 30, 30d supply, fill #3

## 2021-08-23 MED ORDER — ALBUTEROL SULFATE HFA 108 (90 BASE) MCG/ACT IN AERS
2.0000 | INHALATION_SPRAY | RESPIRATORY_TRACT | 3 refills | Status: DC | PRN
  Filled 2021-08-23: qty 18, 16d supply, fill #0

## 2021-08-23 MED ORDER — GUAIFENESIN ER 600 MG PO TB12
ORAL_TABLET | ORAL | 2 refills | Status: DC
  Filled 2021-08-23: qty 120, fill #0

## 2021-08-23 NOTE — Patient Instructions (Signed)
Chronic Rhinitis - suspect allergic: - allergy testing today was inaccurate, suspect due to recent use of promethazine - Start Singulair (Montelukast) '10mg'$  nightly.  Discontinue if having nightmares or behavior changes - Start Clarinex (desloratadine) 5 mg daily or daily as needed.   - Start mucinex 600 mg - 1200 mg (1 or 2 tablets) up to twice daily as needed for drainage. Drink with at least one glass of water when taking. Stay hydrated.   Chronic Conjunctivitis-suspect allergic:  - Consider Allergy Eye drops: great options include Pataday (Olopatadine) or Zaditor (ketotifen) for eye symptoms daily as needed-both sold over the counter if not covered by insurance.   -Avoid eye drops that say red eye relief as they may contain medications that dry out your eyes.  Chronic cough:  - suspect secondary to post nasal drainage, reflux and possible mild intermittent asthma - if cough continues, try albuterol 2 puffs and make note if helpful - pay attention to any reflux/heartburn symptoms to make sure this is also controlled - your breathing test today looked reassuring  Possible metal allergy:  - consider metal patch testing in the future  Follow-up for repeat testing at your convenience.  Please stop all antihistamines including promethazine for at least 3 days prior to your testing.

## 2021-08-25 ENCOUNTER — Other Ambulatory Visit (HOSPITAL_COMMUNITY): Payer: Self-pay

## 2021-08-25 ENCOUNTER — Ambulatory Visit: Payer: No Typology Code available for payment source | Admitting: Family

## 2021-08-25 ENCOUNTER — Other Ambulatory Visit: Payer: No Typology Code available for payment source

## 2021-08-27 ENCOUNTER — Other Ambulatory Visit: Payer: Self-pay | Admitting: Internal Medicine

## 2021-08-27 ENCOUNTER — Other Ambulatory Visit: Payer: Self-pay | Admitting: Neurology

## 2021-08-27 DIAGNOSIS — G43009 Migraine without aura, not intractable, without status migrainosus: Secondary | ICD-10-CM

## 2021-08-28 ENCOUNTER — Other Ambulatory Visit (HOSPITAL_COMMUNITY): Payer: Self-pay

## 2021-08-28 MED ORDER — ALPRAZOLAM 1 MG PO TABS
ORAL_TABLET | Freq: Every day | ORAL | 0 refills | Status: DC
Start: 2021-08-28 — End: 2021-11-01
  Filled 2021-08-28: qty 90, 90d supply, fill #0

## 2021-08-28 MED ORDER — CYANOCOBALAMIN 1000 MCG/ML IJ SOLN
INTRAMUSCULAR | 11 refills | Status: DC
  Filled 2021-08-28: qty 3, 84d supply, fill #0
  Filled 2021-12-14: qty 3, 84d supply, fill #1
  Filled 2022-03-20: qty 3, 84d supply, fill #2
  Filled 2022-05-31: qty 3, 84d supply, fill #3

## 2021-08-28 MED ORDER — NURTEC 75 MG PO TBDP
75.0000 mg | ORAL_TABLET | Freq: Every day | ORAL | 0 refills | Status: DC | PRN
  Filled 2021-08-28: qty 8, 30d supply, fill #0

## 2021-08-30 ENCOUNTER — Other Ambulatory Visit (HOSPITAL_COMMUNITY): Payer: Self-pay

## 2021-08-31 ENCOUNTER — Other Ambulatory Visit (HOSPITAL_COMMUNITY): Payer: Self-pay

## 2021-09-01 ENCOUNTER — Other Ambulatory Visit (HOSPITAL_COMMUNITY): Payer: Self-pay

## 2021-09-12 ENCOUNTER — Other Ambulatory Visit (HOSPITAL_COMMUNITY): Payer: Self-pay

## 2021-09-13 ENCOUNTER — Other Ambulatory Visit (HOSPITAL_COMMUNITY): Payer: Self-pay

## 2021-09-18 ENCOUNTER — Other Ambulatory Visit (HOSPITAL_COMMUNITY): Payer: Self-pay

## 2021-09-18 ENCOUNTER — Encounter: Payer: Self-pay | Admitting: Family

## 2021-09-22 ENCOUNTER — Telehealth: Payer: Self-pay | Admitting: Internal Medicine

## 2021-09-22 NOTE — Telephone Encounter (Signed)
Scheduled for Tuesday

## 2021-09-22 NOTE — Telephone Encounter (Signed)
Victoria [name] [phone]  She;ia called to say she would like to come in on Monday and talk with you, she is under a lot of stress at work now.

## 2021-09-26 ENCOUNTER — Encounter: Payer: Self-pay | Admitting: Internal Medicine

## 2021-09-26 ENCOUNTER — Ambulatory Visit (INDEPENDENT_AMBULATORY_CARE_PROVIDER_SITE_OTHER): Payer: No Typology Code available for payment source | Admitting: Internal Medicine

## 2021-09-26 VITALS — BP 116/60 | HR 57 | Temp 96.8°F | Wt 282.8 lb

## 2021-09-26 DIAGNOSIS — Z8669 Personal history of other diseases of the nervous system and sense organs: Secondary | ICD-10-CM

## 2021-09-26 DIAGNOSIS — G8929 Other chronic pain: Secondary | ICD-10-CM

## 2021-09-26 DIAGNOSIS — R5383 Other fatigue: Secondary | ICD-10-CM

## 2021-09-26 DIAGNOSIS — M7918 Myalgia, other site: Secondary | ICD-10-CM

## 2021-09-26 DIAGNOSIS — Z566 Other physical and mental strain related to work: Secondary | ICD-10-CM

## 2021-09-26 DIAGNOSIS — Z9884 Bariatric surgery status: Secondary | ICD-10-CM

## 2021-09-26 DIAGNOSIS — Z8616 Personal history of COVID-19: Secondary | ICD-10-CM | POA: Diagnosis not present

## 2021-09-26 DIAGNOSIS — E538 Deficiency of other specified B group vitamins: Secondary | ICD-10-CM | POA: Diagnosis not present

## 2021-09-26 DIAGNOSIS — K909 Intestinal malabsorption, unspecified: Secondary | ICD-10-CM

## 2021-09-26 DIAGNOSIS — Z8639 Personal history of other endocrine, nutritional and metabolic disease: Secondary | ICD-10-CM | POA: Diagnosis not present

## 2021-09-26 DIAGNOSIS — E890 Postprocedural hypothyroidism: Secondary | ICD-10-CM

## 2021-09-26 DIAGNOSIS — Z6841 Body Mass Index (BMI) 40.0 and over, adult: Secondary | ICD-10-CM

## 2021-09-27 NOTE — Progress Notes (Signed)
   Subjective:    Patient ID: Victoria [name], female    DOB: Mar 18, 1960, 61 y.o.   MRN: [id number]  HPI 61 year old Female had COVID-19 with volume depletion treated with IV fluids June 6.  History of Roux-en-Y gastric bypass surgery by Dr. Hassell Done in 2016 after which she developed reactive hypoglycemia.  Weight prior to weight loss surgery was 296 pounds.  History of PAT seen by Dr. Radford Pax.  History of iron deficiency anemia seen by oncology.  History of hypothyroidism treated with levothyroxine.  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  Left hip arthroplasty in December 2010.  Right hip arthroplasty March 2014.  History of chronic duodenitis seen with endoscopy in 2012.  History of GE reflux.  History of thyroidectomy in 1988 is on thyroid replacement medication.  Has chronic back pain and has seen Dr. [name].  Back pain is worse when standing on her feet a lot.  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  However, she is having some knee issues and will be seeing Dr. Ninfa Linden in the near future.  She also will be seeing Dr. Davy Pique for epidural steroid injection at [facility] as Dr. [name] has moved to another location apparently.  History of iron deficiency anemia seen in the oncology likely related to gastric bypass surgery.   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  Her husband has had to undergo surgery recently.  She recently had COVID and is still fatigued.  Many days she is tired.  TSH in May was normal at 1.91 on thyroid replacement medication.  Iron level was normal in May.  B12 level was normal.  She gives herself monthly B12 injections.   Review of Systems has fatigue.  Musculoskeletal  pain.  Situational stress with husband who has had some health issues.  Work is hard.     Objective:   Physical Exam  Blood pressure 116/60 pulse 57 regular pulse oximetry 99% weight 282 pounds 12.8 ounces  She seems mildly depressed and physically fatigued.  She discussed her orthopedic issues, situational stress with husband's health, and work stress.  She is having considerable musculoskeletal pain.  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      Assessment & Plan:  Musculoskeletal pain due to orthopedic issues-has consultation with orthopedist in the near future  Situational stress with husband's health  Work stress  B12 deficiency-takes monthly B12 injections  History of thyroidectomy-is on thyroid replacement medication.  TSH in May was normal  Lumbar back pain-to see Dr. Davy Pique soon.  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  She will be seeing Dr. Davy Pique soon hopefully for epidural steroid injection.  She will get FMLA forms sent here and we will complete them for intermittent FMLA.  25 minutes spent with patient interviewing her in addition to reviewing chart

## 2021-09-27 NOTE — Patient Instructions (Addendum)
Patient will be seeing Dr. Davy Pique soon regarding lumbar back pain.  She will be seeing orthopedist soon regarding knee pain.  Forms will be completed for intermittent FMLA due to chronic medical issues and stress.

## 2021-09-28 ENCOUNTER — Telehealth: Payer: Self-pay | Admitting: Internal Medicine

## 2021-09-28 DIAGNOSIS — Z0289 Encounter for other administrative examinations: Secondary | ICD-10-CM

## 2021-09-28 NOTE — Telephone Encounter (Signed)
This message was sent via [employer], a product from [employer] [email or web address]/                    -------Fax Transmission Report-------  To:               Recipient at [phone] Subject:          FW: Hp Scans Result:           The transmission was successful. Explanation:      All Pages Ok Pages Sent:       10 Connect Time:     4 minutes, 50 seconds Transmit Time:    09/28/2021 13:09 Transfer Rate:    14400 Status Code:      0000 Retry Count:      0 Job Id:           [id number] Unique Id:        [id number] Fax Line:         39 Fax Server:       [employer]

## 2021-09-28 NOTE — Telephone Encounter (Signed)
Faxed Completed FMLA forms to [employer] [phone]  Leave of Absence Intake Number [id number]

## 2021-09-29 NOTE — Telephone Encounter (Signed)
Patient called to check on the status of FMLA forms and was advised that forms had been received and faxed back.

## 2021-10-04 ENCOUNTER — Encounter (INDEPENDENT_AMBULATORY_CARE_PROVIDER_SITE_OTHER): Payer: Self-pay

## 2021-10-09 ENCOUNTER — Ambulatory Visit: Payer: No Typology Code available for payment source | Admitting: Obstetrics and Gynecology

## 2021-10-09 ENCOUNTER — Other Ambulatory Visit (HOSPITAL_COMMUNITY): Payer: Self-pay

## 2021-10-09 ENCOUNTER — Encounter: Payer: Self-pay | Admitting: Obstetrics and Gynecology

## 2021-10-09 VITALS — BP 122/72 | HR 73 | Wt 273.0 lb

## 2021-10-09 DIAGNOSIS — N760 Acute vaginitis: Secondary | ICD-10-CM | POA: Diagnosis not present

## 2021-10-09 LAB — WET PREP FOR TRICH, YEAST, CLUE

## 2021-10-09 MED ORDER — FLUCONAZOLE 150 MG PO TABS
ORAL_TABLET | ORAL | 1 refills | Status: DC
  Filled 2021-10-09: qty 15, 90d supply, fill #0

## 2021-10-09 NOTE — Patient Instructions (Signed)

## 2021-10-09 NOTE — Progress Notes (Signed)
GYNECOLOGY  VISIT   HPI: 61 y.o.   Married Black or [place] American Not Hispanic or Latino  female   [phone] with No LMP recorded. Patient has had a hysterectomy.   here for vaginal itching and discharge.    She states that on July 11 she had a knot come up on her groin and the knot took about 2 weeks to go away. After that she started to have increased discharge and itching.    H/O DIV was previously treated with vaginal steroids x 2  then clindamycin. The clindamycin helped, she finished that treatment ~1 year ago.    In 5/23 and 6/23 she was treated for yeast. No antibiotics  She has a h/o hysterectomy. On divigel for ERT.   GYNECOLOGIC HISTORY: No LMP recorded. Patient has had a hysterectomy. Contraception:hysterectomy  Menopausal hormone therapy: none         OB History     Gravida  2   Para  1   Term      Preterm  1   AB  1   Living  2      SAB      IAB  1   Ectopic      Multiple  1   Live Births  2              Patient Active Problem List   Diagnosis Date Noted   Spinal stenosis of lumbar region 07/07/2020   Unilateral primary osteoarthritis, right knee 06/08/2020   Displacement of lumbar intervertebral disc 06/02/2020   Lumbar spondylosis 06/02/2020   Lumbar stenosis with neurogenic claudication 06/02/2020   Arm numbness 05/30/2020   Chronic neck pain 05/30/2020   Other chronic pain 05/30/2020   Elevated blood-pressure reading, without diagnosis of hypertension 05/30/2020   Status post arthroscopy of right knee 12/11/2019   Polyethylene wear of left knee joint prosthesis ([place]) 09/09/2019   Acute medial meniscal tear, right, subsequent encounter 08/27/2019   History of total left knee replacement 07/16/2019   Peroneal neuropathy at knee, left 06/25/2019   Lumbosacral radiculopathy 06/07/2019   Iron malabsorption 10/16/2018   IDA (iron deficiency anemia) 10/16/2018   OA (osteoarthritis) of knee 01/20/2018   Acute meniscal tear, medial  07/17/2017   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   Failed total hip arthroplasty, 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/2017   Pain in left knee 04/11/2017   Insomnia 11/08/2016   Pernicious anemia 11/08/2016   Leukopenia 07/09/2016   Hypocupremia 07/09/2016   Palpitations 04/12/2016   Reactive hypoglycemia 10/31/2015   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 Renold Genta now   Constipation    Degenerative joint disease of low back 09/02/15    Dr. Maureen Ralphs   Edema of both lower legs    Fibrocystic breast    Gallbladder problem    GERD (gastroesophageal reflux disease)    Headache    per pt more stress/tension   Heart palpitations    SEE EPIC ENCOUTNER , CARDIOLOGY DR. Tressia Miners TURNER 2018;  reports on 05-16-17 " i haven't had any bouts of those lately"     Hemorrhoids    History of Clostridium difficile infection 12/2010   History of exercise intolerance    ETT on 04-27-2016-- negative ([facility] treadmill score 7)   History of Helicobacter pylori infection 2001 and 09/ 2012   HSV-2 infection    genital   Hx of adenomatous colonic polyps 10/17/2007   Hydradenitis    per pt currently treated with Humira injection   Hypothyroidism, postsurgical 1980   IBS (irritable bowel syndrome)    Joint pain    Medial meniscus tear    right knee   Osteoarthritis    Palpitations    Pernicious anemia    b12 def   PONV (postoperative nausea and vomiting)    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   Prediabetes    Reactive  hypoglycemia followed by pcp   post gastrectomy dumping syndrome   SOB (shortness of breath)    Tendonitis    right shoulder   Varicose veins    Vitamin B 12 deficiency    Vitamin D deficiency     Past Surgical History:  Procedure Laterality Date   BREATH TEK H PYLORI N/A 05/03/2014   Procedure: BREATH TEK H PYLORI;  Surgeon: Kaylyn Lim, MD;  [facility];  Service: General;  Laterality: N/A;   CARDIOVASCULAR STRESS TEST  03/11/2013   dr Tressia Miners turner   Low risk nuclear study w/ a very small anterior perfusion defect that most likely represents breast attenuation artifact, less likely this could represent a true perfusion abnormality in the distribution of diagonal branch of the LAD/  normal LV function and wall motion , ef 66%   CATARACT EXTRACTION W/ INTRAOCULAR LENS  IMPLANT, BILATERAL  10/2017   [place]   COLONOSCOPY  02/2017   Dr [name] ; per patient they found 7 polyps with polypectomy; on 5 year track    ESOPHAGOGASTRODUODENOSCOPY  02/01/2021   FINE NEEDLE ASPIRATION Left 05/22/2017   Procedure: LEFT KNEE ASPIRATION;  Surgeon: Gaynelle Arabian, MD;  [facility];  Service: Orthopedics;  Laterality: Left;   GASTRIC ROUX-EN-Y N/A 07/06/2014   Procedure: LAPAROSCOPIC ROUX-EN-Y GASTRIC BYPASS, ENTEROLYSIS OF ADHESIONS WITH UPPER ENDOSCOPY;  Surgeon: Johnathan Hausen, MD;  [facility];  Service: General;  Laterality: N/A;   HAMMER TOE SURGERY Bilateral 02/17/2008   bilateral foot digit's 2 and 5   HIATAL HERNIA REPAIR N/A 07/06/2014   Procedure: LAPAROSCOPIC REPAIR OF HIATAL HERNIA;  Surgeon: Johnathan Hausen, MD;  [facility];  Service: General;  Laterality: N/A;   I & D KNEE WITH POLY EXCHANGE Left 12/11/2019   Procedure: LEFT KNEE POLY-LINER EXCHANGE;  Surgeon: Mcarthur Rossetti, MD;  Location: [place];  Service: Orthopedics;  Laterality: Left;   KNEE ARTHROSCOPY Right 12/11/2019   Procedure: RIGHT KNEE ARTHROSCOPY  WITH PARTIAL MEDIAL MENISCECTOMY;  Surgeon: Mcarthur Rossetti, MD;  Location: [place];  Service: Orthopedics;  Laterality: Right;   KNEE ARTHROSCOPY WITH MEDIAL MENISECTOMY Left 07/17/2017   Procedure: LEFT KNEE ARTHROSCOPY WITH MEDIAL LATERAL MENISECTOMY;  Surgeon: Gaynelle Arabian, MD;  [facility];  Service: Orthopedics;  Laterality: Left;   MASS EXCISION N/A 10/11/2016   Procedure: EXCISION OF PERIANAL MASS;  Surgeon: [name], MD;  Location: [place];  Service: General;  Laterality: N/A;   [place]  08-25-2002   dr Margaretha Glassing   ovaries retained   TOTAL HIP ARTHROPLASTY Right 05/21/2012   Procedure: TOTAL HIP ARTHROPLASTY;  Surgeon: Gearlean Alf, MD;  [facility];  Service: Orthopedics;  Laterality: Right;   TOTAL HIP ARTHROPLASTY Left 01-31-2009  dr Wynelle Link   TOTAL HIP REVISION Left 05/22/2017   Procedure: Left hip bearing surface and head revision;  Surgeon: Gaynelle Arabian, MD;  [facility];  Service: Orthopedics;  Laterality: Left;   TOTAL KNEE ARTHROPLASTY Left 01/20/2018   Procedure: LEFT TOTAL KNEE ARTHROPLASTY;  Surgeon: Gaynelle Arabian, MD;  [facility];  Service: Orthopedics;  Laterality: Left;  10mn   TRANSTHORACIC ECHOCARDIOGRAM  05/03/2016   ef 55-60%/  trivial MR/  mild TR   TUBAL LIGATION      Current Outpatient Medications  Medication Sig Dispense Refill   Adalimumab (HUMIRA PEN) 40 MG/0.8ML PNKT Inject 1 pen subcutaneously every week 4 each 5   albuterol (VENTOLIN HFA) 108 (90 Base) MCG/ACT inhaler Inhale 2 puffs into the lungs every 4 hours as needed for wheezing or shortness of breath. 18 g 3   ALPRAZolam (XANAX) 1 MG tablet Take one tablet by mouth at bedtime. 90 tablet 0   Calcium Carb-Cholecalciferol (CALCIUM + D3 PO) Take 1 tablet by mouth daily.     cyanocobalamin (DODEX) 1000 MCG/ML injection INJECT 1 ML INTRAMUSCULARLY EVERY MONTH 3 mL 11    desloratadine (CLARINEX) 5 MG tablet Take 1 tablet by mouth daily. 30 tablet 3   ergocalciferol (VITAMIN D2) 1.25 MG (50000 UT) capsule Take 1 capsule by mouth once a week. 12 capsule 1   Estradiol (DIVIGEL) 1.25 MG/1.25GM GEL Use one packet daily as directed 112.5 g 3   flecainide (TAMBOCOR) 50 MG tablet Take 1 tablet by mouth 2 times daily. 180 tablet 3   gabapentin (NEURONTIN) 600 MG tablet TAKE 1 TABLET BY MOUTH 3 TIMES DAILY (Patient taking differently: Take by mouth as needed (PAIN).) 90 tablet 5   guaiFENesin (MUCINEX) 600 MG 12 hr tablet Take 1 - 2 tablets by mouth twice daily as needed for drainage.  Take with lots of water 120 tablet 2   HYDROcodone-acetaminophen (NORCO/VICODIN) 5-325 MG tablet Take 1 tablet by mouth every 6 (six) hours as needed for moderate pain. 20 tablet 0   levothyroxine (SYNTHROID) 112 MCG tablet TAKE 1 TABLET BY MOUTH ONCE A DAY 90 tablet 1   metoprolol succinate (TOPROL-XL) 25 MG 24 hr tablet TAKE 1 TABLET BY MOUTH DAILY. 90 tablet 3   montelukast (SINGULAIR) 10 MG tablet Take 1 tablet by mouth at bedtime. 30 tablet 3   Multiple Vitamin (MULTIVITAMIN WITH MINERALS) TABS tablet Take 1 tablet by mouth daily.     NURTEC 75 MG TBDP Dissolve one tablet by mouth daily as needed for migraine. Take as close to onset of migraine as possible. ONE DAILY MAXIMUM.  Must be seen for further refills. 8 tablet 0   Olopatadine HCl 0.2 % SOLN Apply 1 drop to eye daily as needed. 2.5 mL 5   pantoprazole (PROTONIX) 40 MG tablet Take 1 tablet by mouth daily 20 minutes before breakfast 90 tablet 3   promethazine (PHENERGAN) 25 MG tablet TAKE 1 TABLET BY MOUTH EVERY 8 HOURS AS NEEDED FOR NAUSEA AND/OR VOMITING 30 tablet 5   tiZANidine (ZANAFLEX) 4 MG capsule Take 1 capsule by mouth 3 times daily as needed for muscle spasms. 90 capsule 1   No current facility-administered medications for this visit.  Facility-Administered Medications Ordered in Other Visits  Medication Dose Route  Frequency Provider Last Rate Last Admin   gadobenate dimeglumine (MULTIHANCE) injection 20 mL  20 mL Intravenous Once PRN Melvenia Beam, MD         ALLERGIES: Other and Estradiol  Family History  Problem Relation Age of Onset   Diabetes Father    High blood pressure Father    High Cholesterol Father    Heart disease Father    Obesity Father    Diabetes Sister    Hypertension Sister    Hypertension Mother    Headache Mother    High Cholesterol Mother    Thyroid disease Mother    Obesity Mother     Social History   Socioeconomic History   Marital status: Married    Spouse name: Barbaraann Rondo   Number of children: 2   Years of education: Not on file   Highest education level: Not on file  Occupational History   Occupation: Therapist, sports  Tobacco Use   Smoking status: Never   Smokeless tobacco: Never  Vaping Use   Vaping Use: Never used  Substance and Sexual Activity   Alcohol use: No   Drug use: No   Sexual activity: Not Currently    Partners: Male    Birth control/protection: Surgical    Comment: hysterectomy  Other Topics Concern   Not on file  Social History Narrative   Lives at home with spouse   Right handed   Caffeine: diet zero mtn dew, occasionally    Social Determinants of Health   Financial Resource Strain: Not on file  Food Insecurity: Not on file  Transportation Needs: Not on file  Physical Activity: Not on file  Stress: Not on file  Social Connections: Not on file  Intimate Partner Violence: Not on file    Review of Systems  Genitourinary:        Vaginal itching     PHYSICAL EXAMINATION:    BP 122/72   Pulse 73   Wt 273 lb (123.8 kg)   SpO2 99%   BMI 42.76 kg/m     General appearance: alert, cooperative and appears stated age  Pelvic: External genitalia:  no lesions              Urethra:  normal appearing urethra with no masses, tenderness or lesions              Bartholins and Skenes: normal                 Vagina: normal appearing vagina  with normal color and discharge, no lesions              Cervix: absent               Chaperone was present for exam.  1. Acute vaginitis - WET PREP FOR TRICH, YEAST, CLUE  2. Recurrent vaginitis 3rd yeast infection since 5/23. No h/o DM, no antibiotic use - fluconazole (DIFLUCAN) 150 MG tablet; Take one tablet po q72 hours x 3 doses, then take one tablet a week  Dispense: 15 tablet; Refill: 1

## 2021-10-10 ENCOUNTER — Other Ambulatory Visit (HOSPITAL_COMMUNITY): Payer: Self-pay

## 2021-10-11 ENCOUNTER — Encounter: Payer: Self-pay | Admitting: Cardiology

## 2021-10-11 DIAGNOSIS — R0609 Other forms of dyspnea: Secondary | ICD-10-CM

## 2021-10-11 DIAGNOSIS — R5383 Other fatigue: Secondary | ICD-10-CM

## 2021-10-12 ENCOUNTER — Ambulatory Visit (HOSPITAL_COMMUNITY): Payer: No Typology Code available for payment source | Attending: Cardiology

## 2021-10-12 ENCOUNTER — Other Ambulatory Visit (HOSPITAL_COMMUNITY): Payer: Self-pay

## 2021-10-12 ENCOUNTER — Encounter: Payer: Self-pay | Admitting: Cardiology

## 2021-10-12 DIAGNOSIS — R0609 Other forms of dyspnea: Secondary | ICD-10-CM | POA: Diagnosis present

## 2021-10-12 DIAGNOSIS — R5383 Other fatigue: Secondary | ICD-10-CM | POA: Insufficient documentation

## 2021-10-12 LAB — ECHOCARDIOGRAM COMPLETE
Area-P 1/2: 3.37 cm2
S' Lateral: 2.7 cm

## 2021-10-16 ENCOUNTER — Telehealth: Payer: Self-pay | Admitting: Cardiology

## 2021-10-16 ENCOUNTER — Other Ambulatory Visit (HOSPITAL_COMMUNITY): Payer: Self-pay

## 2021-10-16 ENCOUNTER — Encounter: Payer: Self-pay | Admitting: Cardiology

## 2021-10-16 NOTE — Telephone Encounter (Signed)
Patient returned RN's call regarding test results.

## 2021-10-16 NOTE — Telephone Encounter (Signed)
Sueanne Margarita, MD  10/12/2021  5:36 PM EDT     Echo shows normal heart function with EF 58% With mildly thickened heart muscle, mild to moderate leakiness of the mitral valve-repeat echo in 1 year for mild to moderate MR   Left message for patient to call back.

## 2021-10-16 NOTE — Telephone Encounter (Signed)
See MyChart message

## 2021-10-17 ENCOUNTER — Other Ambulatory Visit (HOSPITAL_COMMUNITY): Payer: Self-pay

## 2021-10-18 ENCOUNTER — Ambulatory Visit (INDEPENDENT_AMBULATORY_CARE_PROVIDER_SITE_OTHER): Payer: No Typology Code available for payment source

## 2021-10-18 ENCOUNTER — Other Ambulatory Visit (HOSPITAL_COMMUNITY): Payer: Self-pay

## 2021-10-18 ENCOUNTER — Ambulatory Visit: Payer: No Typology Code available for payment source | Admitting: Orthopaedic Surgery

## 2021-10-18 ENCOUNTER — Encounter: Payer: Self-pay | Admitting: Family

## 2021-10-18 ENCOUNTER — Other Ambulatory Visit: Payer: Self-pay | Admitting: Pharmacist

## 2021-10-18 ENCOUNTER — Encounter: Payer: Self-pay | Admitting: Orthopaedic Surgery

## 2021-10-18 VITALS — Wt 279.0 lb

## 2021-10-18 DIAGNOSIS — M25561 Pain in right knee: Secondary | ICD-10-CM | POA: Diagnosis not present

## 2021-10-18 DIAGNOSIS — G8929 Other chronic pain: Secondary | ICD-10-CM

## 2021-10-18 DIAGNOSIS — M1711 Unilateral primary osteoarthritis, right knee: Secondary | ICD-10-CM | POA: Diagnosis not present

## 2021-10-18 MED ORDER — HUMIRA PEN 40 MG/0.8ML ~~LOC~~ PNKT: PEN_INJECTOR | SUBCUTANEOUS | 5 refills | Status: DC

## 2021-10-18 MED ORDER — METHYLPREDNISOLONE ACETATE 40 MG/ML IJ SUSP
40.0000 mg | INTRAMUSCULAR | Status: AC | PRN
  Administered 2021-10-18: 40 mg via INTRA_ARTICULAR

## 2021-10-18 MED ORDER — HUMIRA PEN 40 MG/0.8ML ~~LOC~~ PNKT
PEN_INJECTOR | SUBCUTANEOUS | 5 refills | Status: DC
  Filled 2021-10-18: qty 4, 28d supply, fill #0
  Filled 2021-11-07: qty 4, 28d supply, fill #1
  Filled 2021-12-05: qty 4, 28d supply, fill #2
  Filled 2022-01-02: qty 4, 28d supply, fill #3
  Filled 2022-01-29 – 2022-02-02 (×3): qty 4, 28d supply, fill #4
  Filled 2022-02-22: qty 4, 28d supply, fill #5

## 2021-10-18 MED ORDER — LIDOCAINE HCL 1 % IJ SOLN
3.0000 mL | INTRAMUSCULAR | Status: AC | PRN
  Administered 2021-10-18: 3 mL

## 2021-10-18 MED ORDER — CELECOXIB 200 MG PO CAPS
200.0000 mg | ORAL_CAPSULE | Freq: Two times a day (BID) | ORAL | 3 refills | Status: DC | PRN
  Filled 2021-10-18: qty 60, 30d supply, fill #0
  Filled 2021-11-13: qty 60, 30d supply, fill #1
  Filled 2022-01-30: qty 60, 30d supply, fill #2

## 2021-10-18 NOTE — Progress Notes (Signed)
Office Visit Note   Patient: Victoria [name]           Date of Birth: 07-28-1960           MRN: [id number] Visit Date: 10/18/2021              Requested by: Elby Showers, MD [address] [place],  [place] [address] PCP: Elby Showers, MD   Assessment & Plan: Visit Diagnoses:  1. Chronic pain of right knee   2. Unilateral primary osteoarthritis, right knee   3. Severe obesity (BMI >= 40) (HCC)     Plan: I did place a steroid injection in her right knee today to temporize the pain from her osteoarthritis.  She is a perfect candidate for hyaluronic acid for that right knee to treat the long-term pain from osteoarthritis.  I have advocated weight loss as well.  She agrees with this treatment plan.  I will send in some Celebrex as an anti-inflammatory and see her back in follow-up to hopefully place hyaluronic acid into the right knee.  Follow-Up Instructions: No follow-ups on file.   Orders:  Orders Placed This Encounter  Procedures   Large Joint Inj   XR Knee 1-2 Views Right   No orders of the defined types were placed in this encounter.     Procedures: Large Joint Inj: R knee on 10/18/2021 9:08 AM Indications: diagnostic evaluation and pain Details: 22 G 1.5 in needle, superolateral approach  Arthrogram: No  Medications: 3 mL lidocaine 1 %; 40 mg methylPREDNISolone acetate 40 MG/ML Outcome: tolerated well, no immediate complications Procedure, treatment alternatives, risks and benefits explained, specific risks discussed. Consent was given by the patient. Immediately prior to procedure a time out was called to verify the correct patient, procedure, equipment, support staff and site/side marked as required. Patient was prepped and draped in the usual sterile fashion.       Clinical Data: No additional findings.   Subjective: Chief Complaint  Patient presents with   Right Knee - Pain  Ms. Brathwaite comes in today with chronic right knee pain.  We  have seen her for this knee before.  It is slowly gotten worse with time.  She has had steroid injections in the past and remote history of hyaluronic acid.  Her pain is daily with that right knee and is detrimentally affecting her mobility, her quality of life, and her actives daily living.  The pain does wake her up at night.  She has tried and failed conservative treatment for well over a year now.  She has gained weight with a BMI of 43.7.  She is not on blood thinning medications and she is not a diabetic.  She has remote history of a right knee arthroscopy that were performed many years ago.  HPI  Review of Systems There is currently listed no headache, chest pain, shortness of breath, fever, chills, nausea, vomiting  Objective: Vital Signs: Wt 279 lb (126.6 kg)   BMI 43.70 kg/m   Physical Exam She is alert and orient x3 and in no acute distress Ortho Exam Examination right knee shows no effusion but global tenderness.  There is patellofemoral potation's of the arc of motion of the knee.  It looks like she is likely in some valgus malalignment. Specialty Comments:  No specialty comments available.  Imaging: XR Knee 1-2 Views Right  Result Date: 10/18/2021 2 views of the right knee show tricompartment arthritis with a varus malalignment, significant loss  of the joint space in the medial compartment and the and patellofemoral arthritic changes.    PMFS History: Patient Active Problem List   Diagnosis Date Noted   Spinal stenosis of lumbar region 07/07/2020   Unilateral primary osteoarthritis, right knee 06/08/2020   Displacement of lumbar intervertebral disc 06/02/2020   Lumbar spondylosis 06/02/2020   Lumbar stenosis with neurogenic claudication 06/02/2020   Arm numbness 05/30/2020   Chronic neck pain 05/30/2020   Other chronic pain 05/30/2020   Elevated blood-pressure reading, without diagnosis of hypertension 05/30/2020   Status post arthroscopy of right knee 12/11/2019    Polyethylene wear of left knee joint prosthesis ([place]) 09/09/2019   Acute medial meniscal tear, right, subsequent encounter 08/27/2019   History of total left knee replacement 07/16/2019   Peroneal neuropathy at knee, left 06/25/2019   Lumbosacral radiculopathy 06/07/2019   Iron malabsorption 10/16/2018   IDA (iron deficiency anemia) 10/16/2018   OA (osteoarthritis) of knee 01/20/2018   Acute meniscal tear, medial 07/17/2017   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   Failed total hip arthroplasty, 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/2017   Pain in left knee 04/11/2017   Insomnia 11/08/2016   Pernicious anemia 11/08/2016   Leukopenia 07/09/2016   Hypocupremia 07/09/2016   Palpitations 04/12/2016   Reactive hypoglycemia 10/31/2015   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/31/2015   Lap gastric bypass May 2016 07/06/2014   History of Clostridium difficile infection 08/12/2011   Postoperative hypothyroidism 08/26/2010   Gastroesophageal reflux disease 08/26/2010   Vitamin D deficiency 08/26/2010   Fibrocystic disease of breast 08/26/2010   Cobalamin deficiency 08/26/2010   Obesity 03/20/2010   Backache 03/20/2010   Past Medical History:  Diagnosis Date   Back pain    Bursitis    right shoulder   Chronic leukopenia    intermittant since 2010, followed by Dr. Renold Genta now   Constipation    Degenerative joint disease of low back 09/02/2015   Dr. Maureen Ralphs   Edema of both lower legs    Fibrocystic breast    Gallbladder problem    GERD (gastroesophageal reflux disease)    Headache    per pt more stress/tension   Heart palpitations    SEE EPIC ENCOUTNER , CARDIOLOGY DR. Tressia Miners TURNER 2018; reports on 05-16-17 " i haven't had any bouts of those lately"     Hemorrhoids    History of Clostridium difficile infection 12/2010   History of exercise intolerance    ETT on 04-27-2016-- negative ([facility] treadmill score 7)   History of Helicobacter pylori infection 2001 and 09/ 2012   HSV-2 infection    genital   Hx  of adenomatous colonic polyps 10/17/2007   Hydradenitis    per pt currently treated with Humira injection   Hypothyroidism, postsurgical 1980   IBS (irritable bowel syndrome)    Joint pain    Medial meniscus tear    right knee   Mitral regurgitation    Mild to moderate by echo 09/2021   Osteoarthritis    Palpitations    Pernicious anemia    b12 def   PONV (postoperative nausea and vomiting)    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   Prediabetes    Reactive hypoglycemia followed by pcp   post gastrectomy dumping syndrome   SOB (shortness of breath)    Tendonitis    right shoulder   Varicose veins    Vitamin B 12 deficiency    Vitamin D deficiency     Family History  Problem Relation Age of  Onset   Diabetes Father    High blood pressure Father    High Cholesterol Father    Heart disease Father    Obesity Father    Diabetes Sister    Hypertension Sister    Hypertension Mother    Headache Mother    High Cholesterol Mother    Thyroid disease Mother    Obesity Mother     Past Surgical History:  Procedure Laterality Date   BREATH TEK H PYLORI N/A 05/03/2014   Procedure: BREATH TEK H PYLORI;  Surgeon: Kaylyn Lim, MD;  [facility];  Service: General;  Laterality: N/A;   CARDIOVASCULAR STRESS TEST  03/11/2013   dr Tressia Miners turner   Low risk nuclear study w/ a very small anterior perfusion defect that most likely represents breast attenuation artifact, less likely this could represent a true perfusion abnormality in the distribution of diagonal branch of the LAD/  normal LV function and wall motion , ef 66%   CATARACT EXTRACTION W/ INTRAOCULAR LENS  IMPLANT, BILATERAL  10/2017   [place]   COLONOSCOPY  02/2017   Dr [name] ; per patient they found 7 polyps with polypectomy; on 5 year track    ESOPHAGOGASTRODUODENOSCOPY  02/01/2021   FINE NEEDLE ASPIRATION Left 05/22/2017   Procedure: LEFT KNEE ASPIRATION;  Surgeon:  Gaynelle Arabian, MD;  [facility];  Service: Orthopedics;  Laterality: Left;   GASTRIC ROUX-EN-Y N/A 07/06/2014   Procedure: LAPAROSCOPIC ROUX-EN-Y GASTRIC BYPASS, ENTEROLYSIS OF ADHESIONS WITH UPPER ENDOSCOPY;  Surgeon: Johnathan Hausen, MD;  [facility];  Service: General;  Laterality: N/A;   HAMMER TOE SURGERY Bilateral 02/17/2008   bilateral foot digit's 2 and 5   HIATAL HERNIA REPAIR N/A 07/06/2014   Procedure: LAPAROSCOPIC REPAIR OF HIATAL HERNIA;  Surgeon: Johnathan Hausen, MD;  [facility];  Service: General;  Laterality: N/A;   I & D KNEE WITH POLY EXCHANGE Left 12/11/2019   Procedure: LEFT KNEE POLY-LINER EXCHANGE;  Surgeon: Mcarthur Rossetti, MD;  Location: [facility];  Service: Orthopedics;  Laterality: Left;   KNEE ARTHROSCOPY Right 12/11/2019   Procedure: RIGHT KNEE ARTHROSCOPY WITH PARTIAL MEDIAL MENISCECTOMY;  Surgeon: Mcarthur Rossetti, MD;  Location: [facility];  Service: Orthopedics;  Laterality: Right;   KNEE ARTHROSCOPY WITH MEDIAL MENISECTOMY Left 07/17/2017   Procedure: LEFT KNEE ARTHROSCOPY WITH MEDIAL LATERAL MENISECTOMY;  Surgeon: Gaynelle Arabian, MD;  [facility];  Service: Orthopedics;  Laterality: Left;   MASS EXCISION N/A 10/11/2016   Procedure: EXCISION OF PERIANAL MASS;  Surgeon: [name], MD;  Location: [facility];  Service: General;  Laterality: N/A;   [place]  08-25-2002   dr Margaretha Glassing   ovaries retained   TOTAL HIP ARTHROPLASTY Right 05/21/2012   Procedure: TOTAL HIP ARTHROPLASTY;  Surgeon: Gearlean Alf, MD;  [facility];  Service: Orthopedics;  Laterality: Right;   TOTAL HIP ARTHROPLASTY Left 01-31-2009  dr Wynelle Link   TOTAL HIP REVISION Left 05/22/2017   Procedure: Left hip bearing surface and head revision;  Surgeon: Gaynelle Arabian, MD;  [facility];  Service: Orthopedics;  Laterality: Left;   TOTAL KNEE ARTHROPLASTY Left 01/20/2018    Procedure: LEFT TOTAL KNEE ARTHROPLASTY;  Surgeon: Gaynelle Arabian, MD;  [facility];  Service: Orthopedics;  Laterality: Left;  71mn   TRANSTHORACIC ECHOCARDIOGRAM  05/03/2016   ef 55-60%/  trivial MR/  mild TR   TUBAL LIGATION     Social History   Occupational History   Occupation: Therapist, sports  Tobacco Use   Smoking status: Never   Smokeless tobacco: Never  Vaping Use   Vaping Use: Never used  Substance and Sexual Activity   Alcohol use: No   Drug use: No   Sexual activity: Not Currently    Partners: Male    Birth control/protection: Surgical    Comment: hysterectomy

## 2021-10-19 ENCOUNTER — Other Ambulatory Visit (HOSPITAL_COMMUNITY): Payer: Self-pay

## 2021-10-19 ENCOUNTER — Telehealth: Payer: Self-pay

## 2021-10-19 NOTE — Telephone Encounter (Signed)
Right knee gel injection  

## 2021-10-23 ENCOUNTER — Ambulatory Visit (INDEPENDENT_AMBULATORY_CARE_PROVIDER_SITE_OTHER): Payer: No Typology Code available for payment source | Admitting: Nurse Practitioner

## 2021-10-23 ENCOUNTER — Encounter: Payer: Self-pay | Admitting: Nurse Practitioner

## 2021-10-23 VITALS — BP 112/60 | HR 55 | Temp 97.7°F | Ht 67.0 in | Wt 275.6 lb

## 2021-10-23 DIAGNOSIS — R0683 Snoring: Secondary | ICD-10-CM | POA: Diagnosis not present

## 2021-10-23 DIAGNOSIS — G4719 Other hypersomnia: Secondary | ICD-10-CM | POA: Diagnosis not present

## 2021-10-23 DIAGNOSIS — G4700 Insomnia, unspecified: Secondary | ICD-10-CM

## 2021-10-23 NOTE — Patient Instructions (Addendum)
Given your symptoms, I am concerned that you could have underlying sleep disordered breathing with obstructive sleep apnea. I am ordering a home sleep study for further evaluation. Someone will contact you to schedule this.   We discussed how untreated sleep apnea puts an individual at risk for cardiac arrhthymias, pulm HTN, DM, stroke and increases their risk for daytime accidents. We also briefly reviewed treatment options including weight loss, side sleeping position, oral appliance, CPAP therapy or referral to ENT for possible surgical options. Be aware of drowsy driving and pull over if you feel sleepy.   Referred to medical weight management   Follow up in 6-8 weeks with Katie Alexy Heldt,NP or as needed

## 2021-10-23 NOTE — Addendum Note (Signed)
Addended by: Clayton Bibles on: 10/23/2021 12:57 PM   Modules accepted: Orders

## 2021-10-23 NOTE — Assessment & Plan Note (Signed)
She has snoring, excessive daytime sleepiness, morning headaches, nocturia. BMI 43. History of cardiac arrhythmias.  Given this,  I am concerned she could have sleep disordered breathing with obstructive sleep apnea. She will need sleep study for further evaluation.    - discussed how weight can impact sleep and risk for sleep disordered breathing - discussed options to assist with weight loss: combination of diet modification, cardiovascular and strength training exercises   - had an extensive discussion regarding the adverse health consequences related to untreated sleep disordered breathing - specifically discussed the risks for hypertension, coronary artery disease, cardiac dysrhythmias, cerebrovascular disease, and diabetes - lifestyle modification discussed   - discussed how sleep disruption can increase risk of accidents, particularly when driving - safe driving practices were discussed  Patient Instructions  Given your symptoms, I am concerned that you could have underlying sleep disordered breathing with obstructive sleep apnea. I am ordering a home sleep study for further evaluation. Someone will contact you to schedule this.   We discussed how untreated sleep apnea puts an individual at risk for cardiac arrhthymias, pulm HTN, DM, stroke and increases their risk for daytime accidents. We also briefly reviewed treatment options including weight loss, side sleeping position, oral appliance, CPAP therapy or referral to ENT for possible surgical options. Be aware of drowsy driving and pull over if you feel sleepy.   Referred to medical weight management   Follow up in 6-8 weeks with Katie Shaquan Puerta,NP or as needed

## 2021-10-23 NOTE — Progress Notes (Signed)
$'@Patient'r$  ID: Victoria [name], female    DOB: 1960-04-23, 61 y.o.   MRN: [id number]  Chief Complaint  Patient presents with   Consult    She is not sleeping well or staying asleep. She feels tired during the day. She got Covid in may 23. She has not felt the same.     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 Past medical history significant for GERD, hypothyroidism, OA, anxiety, obesity s/p gastric bypass, pernicious anemia, insomnia, IDA.  TEST/EVENTS:   10/23/2021: Today - sleep consult Patient presents today for sleep consult referred by Dr. Radford Pax for excessive daytime fatigue symptoms. She has been struggling with her fatigue for many years now; however, she had COVID in May and feels like her symptoms have been worse since then. She's also having trouble staying asleep at night. She will wake up after about 3-4 hours and struggles to get back to sleep. Sometimes she just gets out of the bed to start her day. Her husband tells her that she snores loudly. She wakes up in the morning with a headache and dry mouth. She denies waking up gasping, witnessed apneas, sleep parasomnias/paralysis, drowsy driving, cataplexy.  She goes to bed around 6 PM.  Sometimes a few hours later.  Sleep onset varies; sometimes she has no difficulties and other times, it takes her a little while. She does take Xanax to help her fall asleep at night.  Does not necessarily feel anxious but she feels like her mind races.  Wakes up several times throughout the night; often to use the restroom.  Officially gets out of bed in the morning anytime between 1 AM and 5:15 AM. She has cut back on her caffeine intake throughout the day.  No previous sleep study Had gastric bypass in 2016, lost a significant amount of weight; however, she has slowly put weight back on over the past few years and is up about 40 pounds. She works as a Museum/gallery conservator GI. Never smoker. She does not drink alcohol. Lives at home with her husband. She has a history of atrial tachycardia and followed by cardiology. She also sees Dr. Simona Huh with asthma/allergy for chronic cough and rhinitis. Her sister has sleep apnea and sleeps with CPAP.   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   Estradiol Rash and Other (See Comments)    Local rash with estradiol patches    Immunization History  Administered Date(s) Administered   Influenza Split 11/22/2014   Influenza,inj,Quad PF,6+ Mos 11/12/2013, 12/16/2015   Influenza,inj,quad, With Preservative 10/27/2016   Influenza-Unspecified 11/29/2020   PFIZER(Purple Top)SARS-COV-2 Vaccination 03/19/2019, 04/08/2019, 01/08/2020   Tdap 01/10/2012, 07/12/2021   Zoster Recombinat (Shingrix) 02/01/2020, 04/07/2020    Past Medical History:  Diagnosis Date   Back pain    Bursitis    right shoulder   Chronic leukopenia    intermittant since 2010, followed by Dr. Renold Genta now   Constipation    Degenerative joint disease of low back 09/02/2015   Dr. Maureen Ralphs   Edema of both lower legs    Fibrocystic breast    Gallbladder problem    GERD (gastroesophageal reflux disease)    Headache    per pt more stress/tension   Heart palpitations    SEE EPIC ENCOUTNER , CARDIOLOGY DR. Fransico Him 2018; reports on  05-16-17 " i haven't had any bouts of those lately"     Hemorrhoids    History of Clostridium difficile infection 12/2010   History of exercise intolerance    ETT on 04-27-2016-- negative ([facility] treadmill score 7)   History of Helicobacter pylori infection 2001 and 09/ 2012   HSV-2 infection    genital   Hx of adenomatous colonic polyps 10/17/2007   Hydradenitis    per pt currently treated with Humira injection   Hypothyroidism, postsurgical 1980   IBS (irritable bowel syndrome)    Joint pain    Medial meniscus tear     right knee   Mitral regurgitation    Mild to moderate by echo 09/2021   Osteoarthritis    Palpitations    Pernicious anemia    b12 def   PONV (postoperative nausea and vomiting)    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   Prediabetes    Reactive hypoglycemia followed by pcp   post gastrectomy dumping syndrome   SOB (shortness of breath)    Tendonitis    right shoulder   Varicose veins    Vitamin B 12 deficiency    Vitamin D deficiency     Tobacco History: Social History   Tobacco Use  Smoking Status Never  Smokeless Tobacco Never   Counseling given: Not Answered   Outpatient Medications Prior to Visit  Medication Sig Dispense Refill   Adalimumab (HUMIRA PEN) 40 MG/0.8ML PNKT Inject 1 pen subcutaneously every week 4 each 5   albuterol (VENTOLIN HFA) 108 (90 Base) MCG/ACT inhaler Inhale 2 puffs into the lungs every 4 hours as needed for wheezing or shortness of breath. 18 g 3   ALPRAZolam (XANAX) 1 MG tablet Take one tablet by mouth at bedtime. 90 tablet 0   Calcium Carb-Cholecalciferol (CALCIUM + D3 PO) Take 1 tablet by mouth daily.     celecoxib (CELEBREX) 200 MG capsule Take 1 capsule (200 mg total) by mouth 2 (two) times daily as needed. 60 capsule 3   cyanocobalamin (DODEX) 1000 MCG/ML injection INJECT 1 ML INTRAMUSCULARLY EVERY MONTH 3 mL 11   desloratadine (CLARINEX) 5 MG tablet Take 1 tablet by mouth daily. 30 tablet 3   ergocalciferol (VITAMIN D2) 1.25 MG (50000 UT) capsule Take 1 capsule by mouth once a week. 12 capsule 1   Estradiol (DIVIGEL) 1.25 MG/1.25GM GEL Use one packet daily as directed 112.5 g 3   flecainide (TAMBOCOR) 50 MG tablet Take 1 tablet by mouth 2 times daily. 180 tablet 3   fluconazole (DIFLUCAN) 150 MG tablet Take one tablet by mouth every 72 hours x 3 doses, then take one tablet a week 15 tablet 1   gabapentin (NEURONTIN) 600 MG tablet TAKE 1 TABLET BY MOUTH 3 TIMES DAILY (Patient taking differently: Take by mouth as needed (PAIN).) 90 tablet  5   guaiFENesin (MUCINEX) 600 MG 12 hr tablet Take 1 - 2 tablets by mouth twice daily as needed for drainage.  Take with lots of water 120 tablet 2   levothyroxine (SYNTHROID) 112 MCG tablet TAKE 1 TABLET BY MOUTH ONCE A DAY 90 tablet 1   metoprolol succinate (TOPROL-XL) 25 MG 24 hr tablet TAKE 1 TABLET BY MOUTH DAILY. 90 tablet 3   montelukast (SINGULAIR) 10 MG tablet Take 1 tablet by mouth at bedtime. 30 tablet 3   Multiple Vitamin (MULTIVITAMIN WITH MINERALS) TABS tablet Take 1 tablet by mouth daily.     NURTEC 75 MG TBDP Dissolve one tablet by  mouth daily as needed for migraine. Take as close to onset of migraine as possible. ONE DAILY MAXIMUM.  Must be seen for further refills. 8 tablet 0   Olopatadine HCl 0.2 % SOLN Apply 1 drop to eye daily as needed. 2.5 mL 5   pantoprazole (PROTONIX) 40 MG tablet Take 1 tablet by mouth daily 20 minutes before breakfast 90 tablet 3   promethazine (PHENERGAN) 25 MG tablet TAKE 1 TABLET BY MOUTH EVERY 8 HOURS AS NEEDED FOR NAUSEA AND/OR VOMITING 30 tablet 5   tiZANidine (ZANAFLEX) 4 MG capsule Take 1 capsule by mouth 3 times daily as needed for muscle spasms. 90 capsule 1   HYDROcodone-acetaminophen (NORCO/VICODIN) 5-325 MG tablet Take 1 tablet by mouth every 6 (six) hours as needed for moderate pain. (Patient not taking: Reported on 10/23/2021) 20 tablet 0   Facility-Administered Medications Prior to Visit  Medication Dose Route Frequency Provider Last Rate Last Admin   gadobenate dimeglumine (MULTIHANCE) injection 20 mL  20 mL Intravenous Once PRN Melvenia Beam, MD         Review of Systems:   Constitutional: No night sweats, fevers, chills, or lassitude. +weight gain, excessive daytime sleepiness HEENT: No headaches, difficulty swallowing, tooth/dental problems, or sore throat. No sneezing, itching, ear ache. +nasal congestion (chronic) CV:  No chest pain, orthopnea, PND, swelling in lower extremities, anasarca, dizziness, palpitations,  syncope Resp: +snoring; shortness of breath with strenuous exertion (baseline); occasional dry cough. No excess mucus or change in color of mucus.  No hemoptysis. No wheezing.  No chest wall deformity GI:  No heartburn, indigestion, abdominal pain, nausea, vomiting, diarrhea, change in bowel habits, loss of appetite, bloody stools.  GU: +nocturia. No dysuria, change in color of urine, urgency or frequency.  No flank pain, no hematuria  Skin: No rash, lesions, ulcerations Neuro: No dizziness or lightheadedness.  Psych: No depression or anxiety. Mood stable. +sleep disturbance    Physical Exam:  BP 112/60 (BP Location: Left Arm, Cuff Size: Large)   Pulse (!) 55   Temp 97.7 F (36.5 C)   Ht '5\' 7"'$  (1.702 m)   Wt 275 lb 9.2 oz (125 kg)   SpO2 100%   BMI 43.16 kg/m   GEN: Pleasant, interactive, well-appearing; morbidly obese; in no acute distress. HEENT:  Normocephalic and atraumatic. PERRLA. Sclera white. Nasal turbinates pink, moist and patent bilaterally. Clear rhinorrhea present. Oropharynx pink and moist, without exudate or edema. No lesions, ulcerations, or postnasal drip. Mallampati II NECK:  Supple w/ fair ROM.  CV: RRR, no m/r/g, no peripheral edema. Pulses intact, +2 bilaterally. No cyanosis, pallor or clubbing. PULMONARY:  Unlabored, regular breathing. Clear bilaterally A&P w/o wheezes/rales/rhonchi. No accessory muscle use. No dullness to percussion. GI: BS present and normoactive. Soft, non-tender to palpation.  MSK: No erythema, warmth or tenderness.  Neuro: A/Ox3. No focal deficits noted.   Skin: Warm, no lesions or rashe Psych: Normal affect and behavior. Judgement and thought content appropriate.     Lab Results:  CBC    Component Value Date/Time   WBC 4.4 08/01/2021 1417   RBC 4.19 08/01/2021 1417   HGB 13.6 08/01/2021 1417   HGB 11.8 (L) 07/12/2021 1310   HGB 10.9 (L) 09/18/2018 0853   HGB 12.6 07/09/2016 1529   HCT 41.5 08/01/2021 1417   HCT 35.3  09/18/2018 0853   HCT 38.2 07/09/2016 1529   PLT 217 08/01/2021 1417   PLT 201 07/12/2021 1310   PLT 233 09/18/2018 0853   MCV  99.0 08/01/2021 1417   MCV 94 09/18/2018 0853   MCV 96.9 07/09/2016 1529   MCH 32.5 08/01/2021 1417   MCHC 32.8 08/01/2021 1417   RDW 13.2 08/01/2021 1417   RDW 14.8 09/18/2018 0853   RDW 13.5 07/09/2016 1529   LYMPHSABS 1.3 08/01/2021 1417   LYMPHSABS 1.3 09/18/2018 0853   LYMPHSABS 1.6 07/09/2016 1529   MONOABS 0.4 08/01/2021 1417   MONOABS 0.3 07/09/2016 1529   EOSABS 0.1 08/01/2021 1417   EOSABS 0.2 09/18/2018 0853   BASOSABS 0.0 08/01/2021 1417   BASOSABS 0.0 09/18/2018 0853   BASOSABS 0.0 07/09/2016 1529    BMET    Component Value Date/Time   NA 140 08/01/2021 1417   K 3.7 08/01/2021 1417   CL 107 08/01/2021 1417   CO2 24 08/01/2021 1417   GLUCOSE 97 08/01/2021 1417   BUN 11 08/01/2021 1417   CREATININE 0.89 08/01/2021 1417   CREATININE 0.94 06/29/2021 1105   CALCIUM 8.2 (L) 08/01/2021 1417   GFRNONAA >60 08/01/2021 1417   GFRNONAA 93 06/30/2020 1105   GFRAA 108 06/30/2020 1105    BNP No results found for: "BNP"   Imaging:  XR Knee 1-2 Views Right  Result Date: 10/18/2021 2 views of the right knee show tricompartment arthritis with a varus malalignment, significant loss of the joint space in the medial compartment and the and patellofemoral arthritic changes.  ECHOCARDIOGRAM COMPLETE  Result Date: 10/12/2021    ECHOCARDIOGRAM REPORT   Patient Name:   Victoria [name] Date of Exam: 10/12/2021 Medical Rec #:  [id number]                 Height:       67.0 in Accession #:    [id number]                Weight:       273.0 lb Date of Birth:  [birth year]                BSA:          2.308 m Patient Age:    44 years                  BP:           122/72 mmHg Patient Gender: F                         HR:           49 bpm. Exam Location:  [place] Procedure: 2D Echo, 3D Echo, Pediatric Echo and Color Doppler Indications:    R06.02  SOB  History:        Patient has prior history of Echocardiogram examinations, most                 recent 04/28/2020. Dyspnea on exertion. Other fatigue. Obesity.                 Palpitations.  Sonographer:    Diamond Nickel RCS Referring Phys: Amber  1. Left ventricular ejection fraction by 3D volume is 58 %. The left ventricle has normal function. The left ventricle has no regional wall motion abnormalities. There is mild asymmetric left ventricular hypertrophy of the septal segment. Left ventricular diastolic parameters were normal.  2. Right ventricular systolic function is normal. The right ventricular size is normal.  3. The mitral valve is normal in structure. Mild to moderate mitral  valve regurgitation. No evidence of mitral stenosis.  4. The aortic valve is normal in structure. Aortic valve regurgitation is not visualized. No aortic stenosis is present.  5. The inferior vena cava is normal in size with greater than 50% respiratory variability, suggesting right atrial pressure of 3 mmHg. FINDINGS  Left Ventricle: Left ventricular ejection fraction by 3D volume is 58 %. The left ventricle has normal function. The left ventricle has no regional wall motion abnormalities. The left ventricular internal cavity size was normal in size. There is mild asymmetric left ventricular hypertrophy of the septal segment. Left ventricular diastolic parameters were normal. Right Ventricle: The right ventricular size is normal. No increase in right ventricular wall thickness. Right ventricular systolic function is normal. Left Atrium: Left atrial size was normal in size. Right Atrium: Right atrial size was normal in size. Pericardium: There is no evidence of pericardial effusion. Mitral Valve: The mitral valve is normal in structure. Mild to moderate mitral valve regurgitation. No evidence of mitral valve stenosis. Tricuspid Valve: The tricuspid valve is normal in structure. Tricuspid valve  regurgitation is mild . No evidence of tricuspid stenosis. Aortic Valve: The aortic valve is normal in structure. Aortic valve regurgitation is not visualized. No aortic stenosis is present. Pulmonic Valve: The pulmonic valve was normal in structure. Pulmonic valve regurgitation is not visualized. No evidence of pulmonic stenosis. Aorta: The aortic root is normal in size and structure. Venous: The inferior vena cava is normal in size with greater than 50% respiratory variability, suggesting right atrial pressure of 3 mmHg. IAS/Shunts: No atrial level shunt detected by color flow Doppler.  LEFT VENTRICLE PLAX 2D LVIDd:         4.30 cm         Diastology LVIDs:         2.70 cm         LV e' medial:    13.80 cm/s LV PW:         0.80 cm         LV E/e' medial:  8.4 LV IVS:        1.10 cm         LV e' lateral:   11.30 cm/s LVOT diam:     2.00 cm         LV E/e' lateral: 10.3 LV SV:         89 LV SV Index:   39 LVOT Area:     3.14 cm        3D Volume EF                                LV 3D EF:    Left                                             ventricul                                             ar  ejection                                             fraction                                             by 3D                                             volume is                                             58 %.                                 3D Volume EF:                                3D EF:        58 %                                LV EDV:       151 ml                                LV ESV:       64 ml                                LV SV:        87 ml RIGHT VENTRICLE RV Basal diam:  3.80 cm RV S prime:     10.40 cm/s TAPSE (M-mode): 2.0 cm RVSP:           32.6 mmHg LEFT ATRIUM             Index        RIGHT ATRIUM           Index LA diam:        3.00 cm 1.30 cm/m   RA Pressure: 8.00 mmHg LA Vol (A2C):   48.1 ml 20.84 ml/m  RA Area:     17.90 cm LA Vol (A4C):   44.2 ml  19.15 ml/m  RA Volume:   54.80 ml  23.74 ml/m LA Biplane Vol: 47.5 ml 20.58 ml/m  AORTIC VALVE LVOT Vmax:   119.00 cm/s LVOT Vmean:  69.800 cm/s LVOT VTI:    0.284 m  AORTA Ao Root diam: 3.50 cm Ao Asc diam:  3.40 cm MITRAL VALVE                TRICUSPID VALVE MV Area (PHT): 3.37 cm     TR Peak grad:   24.6 mmHg MV Decel Time: 225 msec     TR Vmax:  248.00 cm/s MV E velocity: 116.00 cm/s  Estimated RAP:  8.00 mmHg MV A velocity: 59.20 cm/s   RVSP:           32.6 mmHg MV E/A ratio:  1.96                             SHUNTS                             Systemic VTI:  0.28 m                             Systemic Diam: 2.00 cm Kardie Tobb DO Electronically signed by Berniece Salines DO Signature Date/Time: 10/12/2021/3:24:44 PM    Final     lidocaine (XYLOCAINE) 1 % (with pres) injection 3 mL     Date Action Dose Route User   10/18/2021 0908 Given 3 mL Other (Right Knee) Mcarthur Rossetti, MD      methylPREDNISolone acetate (DEPO-MEDROL) injection 40 mg     Date Action Dose Route User   10/18/2021 0908 Given 40 mg Intra-articular (Right Knee) Mcarthur Rossetti, MD           No data to display          No results found for: "NITRICOXIDE"      Assessment & Plan:   Excessive daytime sleepiness She has snoring, excessive daytime sleepiness, morning headaches, nocturia. BMI 43. History of cardiac arrhythmias.  Given this,  I am concerned she could have sleep disordered breathing with obstructive sleep apnea. She will need sleep study for further evaluation.    - discussed how weight can impact sleep and risk for sleep disordered breathing - discussed options to assist with weight loss: combination of diet modification, cardiovascular and strength training exercises   - had an extensive discussion regarding the adverse health consequences related to untreated sleep disordered breathing - specifically discussed the risks for hypertension, coronary artery disease, cardiac  dysrhythmias, cerebrovascular disease, and diabetes - lifestyle modification discussed   - discussed how sleep disruption can increase risk of accidents, particularly when driving - safe driving practices were discussed  Patient Instructions  Given your symptoms, I am concerned that you could have underlying sleep disordered breathing with obstructive sleep apnea. I am ordering a home sleep study for further evaluation. Someone will contact you to schedule this.   We discussed how untreated sleep apnea puts an individual at risk for cardiac arrhthymias, pulm HTN, DM, stroke and increases their risk for daytime accidents. We also briefly reviewed treatment options including weight loss, side sleeping position, oral appliance, CPAP therapy or referral to ENT for possible surgical options. Be aware of drowsy driving and pull over if you feel sleepy.   Referred to medical weight management   Follow up in 6-8 weeks with Joellen Jersey Randolf Sansoucie,NP or as needed    Morbid obesity ([place]) Previously underwent gastric bypass surgery in 2016 with weight loss. She has gained around 40 lb since after reviewing her chart. Healthy weight loss measures encouraged. We discussed referral to medical weight management. She has been there one time previously but didn't follow up and has had trouble getting back in. Referral placed today.   Insomnia She struggles with sleep maintenance, which could be the result of untreated sleep disordered breathing. We will await HST results and then  determine if pharmacological therapy is appropriate.    I spent 42 minutes of dedicated to the care of this patient on the date of this encounter to include pre-visit review of records, face-to-face time with the patient discussing conditions above, post visit ordering of testing, clinical documentation with the electronic health record, making appropriate referrals as documented, and communicating necessary findings to members of the patients  care team.  Victoria Bibles, NP 10/23/2021  Pt aware and understands NP's role.

## 2021-10-23 NOTE — Assessment & Plan Note (Signed)
Previously underwent gastric bypass surgery in 2016 with weight loss. She has gained around 40 lb since after reviewing her chart. Healthy weight loss measures encouraged. We discussed referral to medical weight management. She has been there one time previously but didn't follow up and has had trouble getting back in. Referral placed today.

## 2021-10-23 NOTE — Progress Notes (Signed)
Reviewed and agree with assessment/plan.   Chesley Mires, MD [facility] 10/23/2021, 1:34 PM Pager:  [phone]

## 2021-10-23 NOTE — Assessment & Plan Note (Signed)
She struggles with sleep maintenance, which could be the result of untreated sleep disordered breathing. We will await HST results and then determine if pharmacological therapy is appropriate.

## 2021-11-01 ENCOUNTER — Other Ambulatory Visit: Payer: Self-pay | Admitting: Internal Medicine

## 2021-11-02 ENCOUNTER — Other Ambulatory Visit (HOSPITAL_COMMUNITY): Payer: Self-pay

## 2021-11-02 MED ORDER — TIZANIDINE HCL 4 MG PO CAPS
4.0000 mg | ORAL_CAPSULE | Freq: Three times a day (TID) | ORAL | 1 refills | Status: DC | PRN
  Filled 2021-11-02: qty 90, 30d supply, fill #0
  Filled 2021-12-26: qty 90, 30d supply, fill #1

## 2021-11-02 MED ORDER — ALPRAZOLAM 1 MG PO TABS
ORAL_TABLET | Freq: Every day | ORAL | 0 refills | Status: DC
  Filled 2021-11-02: qty 90, fill #0

## 2021-11-02 MED ORDER — GABAPENTIN 600 MG PO TABS
600.0000 mg | ORAL_TABLET | Freq: Three times a day (TID) | ORAL | 5 refills | Status: DC
  Filled 2021-11-02: qty 90, 30d supply, fill #0
  Filled 2022-03-20: qty 90, 30d supply, fill #1
  Filled 2022-05-01: qty 90, 30d supply, fill #2
  Filled 2022-05-31: qty 90, 30d supply, fill #3
  Filled 2022-07-02: qty 90, 30d supply, fill #4
  Filled 2022-07-26: qty 90, 30d supply, fill #5

## 2021-11-03 ENCOUNTER — Other Ambulatory Visit (HOSPITAL_COMMUNITY): Payer: Self-pay

## 2021-11-06 ENCOUNTER — Telehealth: Payer: Self-pay | Admitting: Internal Medicine

## 2021-11-06 NOTE — Telephone Encounter (Signed)
This patient did not see Dr. Dwyane Dee in 5 years.  She will be a new patient for the practice.  I am currently not taking new patients.  If she cannot wait, I would be able to see her next year, January-February.

## 2021-11-06 NOTE — Telephone Encounter (Signed)
Patient is calling to ask if she can switch providers from Dr. Dwyane Dee to Dr. Cruzita Lederer.  Patient states that her husband sees Dr. Cruzita Lederer and that she thinks she would feel more comfortable seeing  Dr. Cruzita Lederer.

## 2021-11-07 ENCOUNTER — Other Ambulatory Visit (HOSPITAL_COMMUNITY): Payer: Self-pay

## 2021-11-09 ENCOUNTER — Ambulatory Visit: Payer: No Typology Code available for payment source

## 2021-11-09 ENCOUNTER — Other Ambulatory Visit (HOSPITAL_COMMUNITY): Payer: Self-pay

## 2021-11-09 DIAGNOSIS — G4733 Obstructive sleep apnea (adult) (pediatric): Secondary | ICD-10-CM | POA: Diagnosis not present

## 2021-11-09 DIAGNOSIS — G4719 Other hypersomnia: Secondary | ICD-10-CM

## 2021-11-09 MED ORDER — SULFAMETHOXAZOLE-TRIMETHOPRIM 800-160 MG PO TABS
1.0000 | ORAL_TABLET | Freq: Two times a day (BID) | ORAL | 1 refills | Status: DC | PRN
  Filled 2021-11-09 – 2022-05-01 (×2): qty 60, 30d supply, fill #0

## 2021-11-10 DIAGNOSIS — G4733 Obstructive sleep apnea (adult) (pediatric): Secondary | ICD-10-CM | POA: Diagnosis not present

## 2021-11-13 ENCOUNTER — Encounter: Payer: Self-pay | Admitting: Family

## 2021-11-13 ENCOUNTER — Other Ambulatory Visit (HOSPITAL_COMMUNITY): Payer: Self-pay

## 2021-11-14 ENCOUNTER — Telehealth: Payer: Self-pay | Admitting: Nurse Practitioner

## 2021-11-14 ENCOUNTER — Other Ambulatory Visit (HOSPITAL_COMMUNITY): Payer: Self-pay

## 2021-11-14 IMAGING — MG MM DIGITAL SCREENING BILAT W/ TOMO AND CAD
8 series · 8 of 24 positions shown · non-contrast
Comparison: Previous exam(s).

CLINICAL DATA: Screening.

EXAM:
DIGITAL SCREENING BILATERAL MAMMOGRAM WITH TOMOSYNTHESIS AND CAD
TECHNIQUE: Bilateral screening digital craniocaudal and mediolateral oblique
mammograms were obtained. Bilateral screening digital breast
tomosynthesis was performed. The images were evaluated with
computer-aided detection.

[L MLO synth-2D]
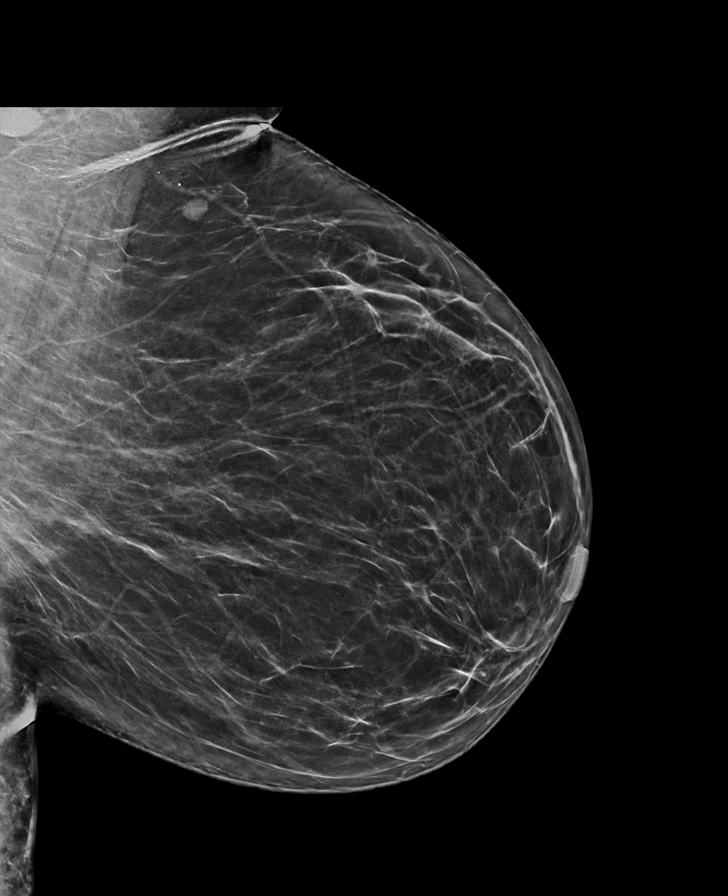

[L CC synth-2D]
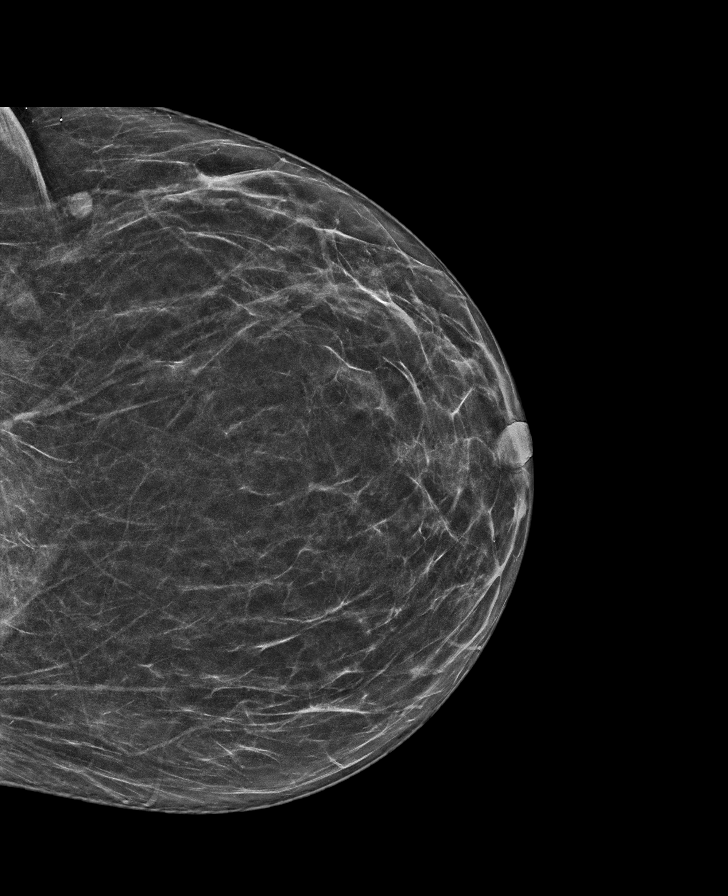

[R MLO synth-2D]
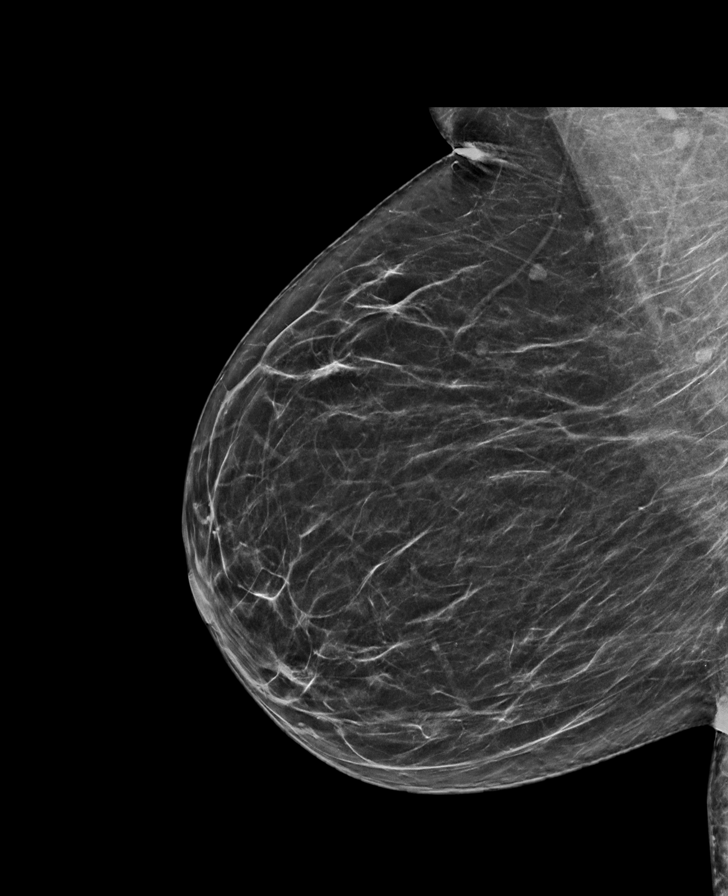

[R CC synth-2D]
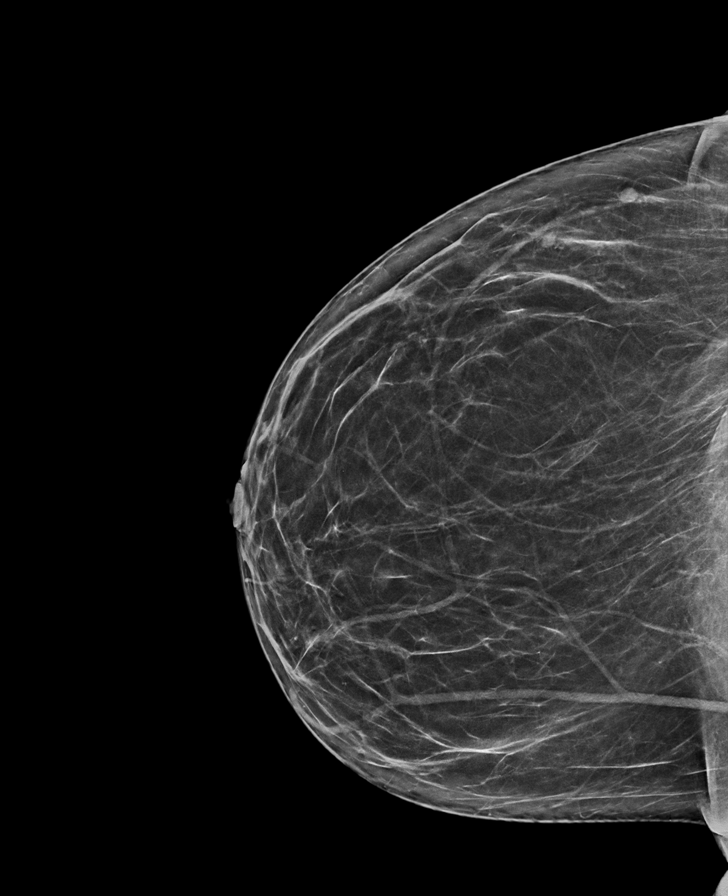

[L CC tomo · tomo slice 33/65.0]
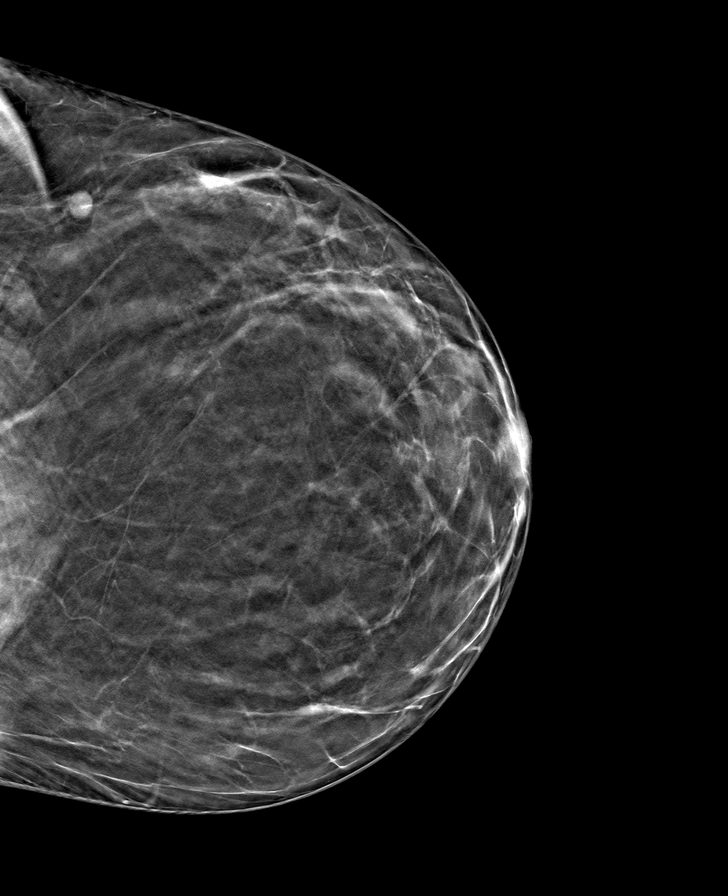

[L MLO tomo · tomo slice 38/75.0]
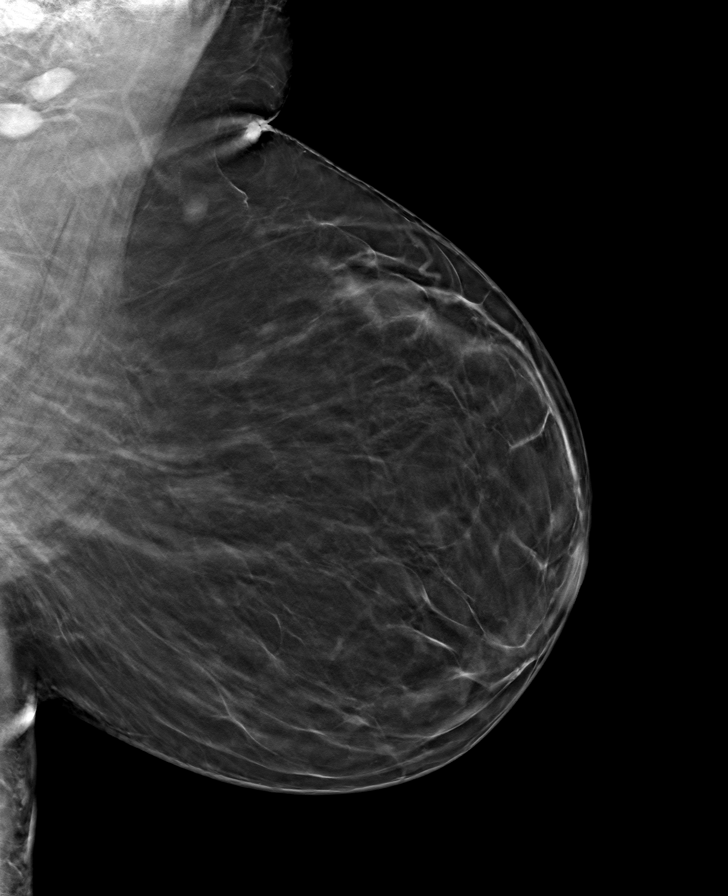

[R CC tomo · tomo slice 35/68.0]
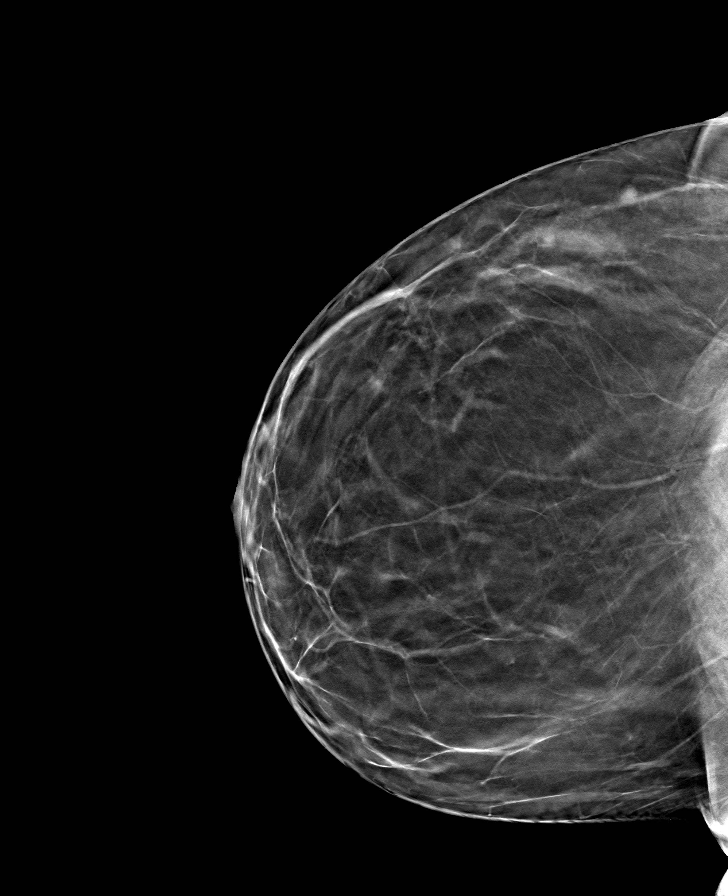

[R MLO tomo · tomo slice 35/69.0]
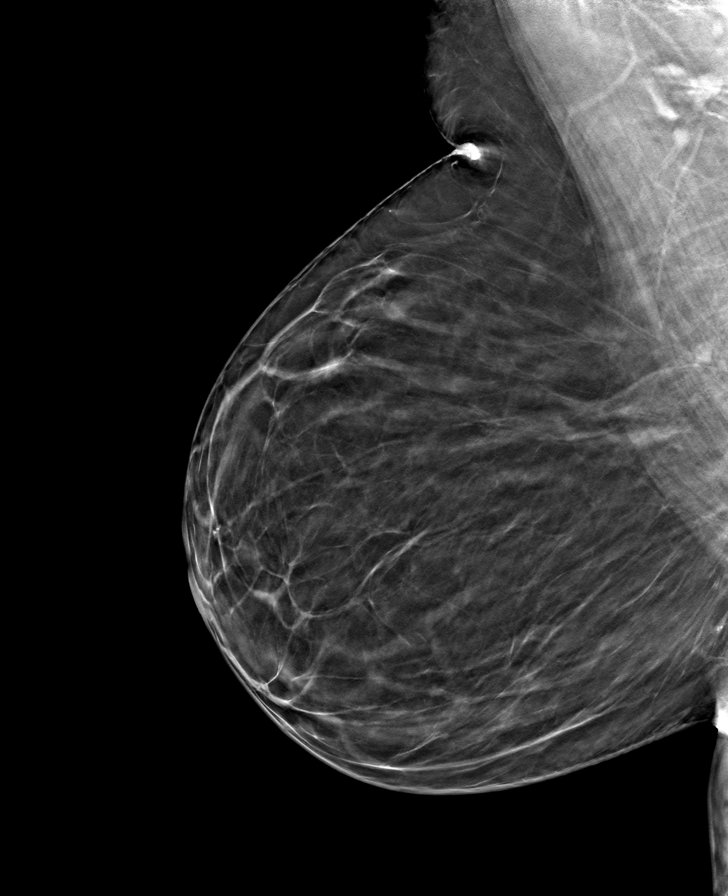

[8 of 24 positions shown; findings below may reference images not displayed]

ACR Breast Density Category b: There are scattered areas of
fibroglandular density.
FINDINGS: There are no findings suspicious for malignancy. The images were
evaluated with computer-aided detection.
IMPRESSION: No mammographic evidence of malignancy. A result letter of this
screening mammogram will be mailed directly to the patient.

RECOMMENDATION:
Screening mammogram in one year. (Code:[id number])

BI-RADS CATEGORY  1: Negative.

## 2021-11-14 NOTE — Telephone Encounter (Signed)
ATC x1, LVM to return call.

## 2021-11-16 ENCOUNTER — Encounter (INDEPENDENT_AMBULATORY_CARE_PROVIDER_SITE_OTHER): Payer: Self-pay | Admitting: Internal Medicine

## 2021-11-16 ENCOUNTER — Ambulatory Visit (INDEPENDENT_AMBULATORY_CARE_PROVIDER_SITE_OTHER): Payer: Self-pay | Admitting: Internal Medicine

## 2021-11-16 VITALS — BP 125/76 | HR 68 | Temp 97.7°F | Ht 66.0 in | Wt 275.0 lb

## 2021-11-16 DIAGNOSIS — Z6841 Body Mass Index (BMI) 40.0 and over, adult: Secondary | ICD-10-CM

## 2021-11-16 DIAGNOSIS — Z0289 Encounter for other administrative examinations: Secondary | ICD-10-CM

## 2021-11-16 DIAGNOSIS — R5383 Other fatigue: Secondary | ICD-10-CM

## 2021-11-16 DIAGNOSIS — Z9884 Bariatric surgery status: Secondary | ICD-10-CM

## 2021-11-16 NOTE — Progress Notes (Signed)
Office: [phone]  /  Fax: [phone]  Initial Visit  Victoria [name] was seen in clinic today to evaluate for obesity. She is interested in losing weight to improve overall health and reduce the risk of weight related complications. She is returning after a one year absence. Husband was ill and life got hectic. She has a history of Roux-en-Y with associated nutritional deficiencies (b12, D, Fe). She has noted progressive weight gain over the last year. Peak weight 292, lowest 197 today 275. Not following a nutritional plan and considering starting exercise program. She endorses fatigue particularly with physical exertion. She has received an iron infusion by hematology.   She presents today to review program treatment options, initial physical assessment, and evaluation.      Past medical history includes:   Past Medical History:  Diagnosis Date   Back pain    Bursitis    right shoulder   Chronic leukopenia    intermittant since 2010, followed by Dr. Renold Genta now   Constipation    Degenerative joint disease of low back 09/02/2015   Dr. Maureen Ralphs   Edema of both lower legs    Fibrocystic breast    Gallbladder problem    GERD (gastroesophageal reflux disease)    Headache    per pt more stress/tension   Heart palpitations    SEE EPIC ENCOUTNER , CARDIOLOGY DR. Tressia Miners TURNER 2018; reports on 05-16-17 " i haven't had any bouts of those lately"     Hemorrhoids    History of Clostridium difficile infection 12/2010   History of exercise intolerance    ETT on 04-27-2016-- negative ([facility] treadmill score 7)   History of Helicobacter pylori infection 2001 and 09/ 2012   HSV-2 infection    genital   Hx of adenomatous colonic polyps 10/17/2007   Hydradenitis    per pt currently treated with Humira injection   Hypothyroidism, postsurgical 1980   IBS (irritable bowel syndrome)    Joint pain    Medial meniscus tear    right knee   Mitral regurgitation    Mild to moderate by echo  09/2021   Osteoarthritis    Palpitations    Pernicious anemia    b12 def   PONV (postoperative nausea and vomiting)    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  General:  Alert, oriented and cooperative. Patient is in no acute distress.  Respiratory: Normal respiratory effort, no problems with respiration noted  Extremities: Normal range of motion.    Mental Status: Normal mood and affect. Normal behavior. Normal judgment and thought content.   Assessment and Plan:  1. Class 3 severe obesity with serious comorbidity and body mass index (BMI) of 40.0 to 44.9 in adult, unspecified obesity type (HCC)  2. Other fatigue - EKG 12-Lead  3. H/O gastric bypass  Check vitamin levels at second visit with initial  labs.      Obesity Treatment Plan:  She will work on garnering support from family and friends to begin weight loss journey. Work on eliminating or reducing the presence of highly processed, calorie dense foods in the home. Complete provided nutritional and psychosocial assessment questionnaire.    Victoria [name] will follow up in the next 1-2 weeks to review the above steps and continue evaluation and treatment.  Obesity Education Performed Today:  She was weighed on the bioimpedance scale and results were discussed and documented in the synopsis.  We discussed obesity as a disease and the importance of a more detailed evaluation of all the factors contributing to the disease.  We discussed the importance of long term  lifestyle changes which include nutrition, exercise and behavioral modifications as well as the importance of customizing this to her specific health and social needs.  We discussed the benefits of reaching a healthier weight to alleviate the symptoms of existing conditions and reduce the risks of the biomechanical, metabolic and psychological effects of obesity.  We discussed the goals of this program is to improve her overall health and not simply achieve a specific BMI.  Frequent visits are very important to patient success. I plan to see her every 2 weeks for the first 3 months and then evaluate the visit frequency after that time. I explained obesity is a life-long chronic disease and long term treatments would be required. Medications to help her follow his eating plan may be offered as appropriate but are not required. All medication decisions will be made together after the initial workup is done and benefits and side effects are discussed in depth.  The clinic rules were reviewed including the late policy, cancellation policy, no show and program fees.  Victoria [name] appears to be in the action stage of change and states they are ready to start intensive lifestyle modifications and behavioral modifications.  30 minutes was spent today on this visit including the above counseling, pre-visit chart review, and post-visit documentation.  Maudry Mayhew, MD

## 2021-11-17 ENCOUNTER — Encounter: Payer: Self-pay | Admitting: Nurse Practitioner

## 2021-11-17 ENCOUNTER — Ambulatory Visit: Payer: No Typology Code available for payment source | Admitting: Nurse Practitioner

## 2021-11-17 VITALS — BP 100/70 | HR 61 | Ht 66.0 in | Wt 280.2 lb

## 2021-11-17 DIAGNOSIS — R0602 Shortness of breath: Secondary | ICD-10-CM

## 2021-11-17 DIAGNOSIS — R42 Dizziness and giddiness: Secondary | ICD-10-CM

## 2021-11-17 DIAGNOSIS — G4733 Obstructive sleep apnea (adult) (pediatric): Secondary | ICD-10-CM | POA: Insufficient documentation

## 2021-11-17 DIAGNOSIS — G4700 Insomnia, unspecified: Secondary | ICD-10-CM | POA: Diagnosis not present

## 2021-11-17 NOTE — Assessment & Plan Note (Signed)
Healthy weight loss encouraged. Discussed this is likely contributing to her dyspnea. She is working on exercising more and diet measures.

## 2021-11-17 NOTE — Progress Notes (Signed)
$'@Patient'f$  ID: Victoria [name], female    DOB: [birth year], 61 y.o.   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 Past medical history significant for GERD, hypothyroidism, OA, anxiety, obesity s/p gastric bypass, pernicious anemia, insomnia, IDA.  TEST/EVENTS:  11/09/2021 HST: AHI 11.7, mild OSA  10/23/2021: OV with Guadalupe Kerekes NP for sleep consult referred by Dr. Radford Pax for excessive daytime fatigue symptoms. She has been struggling with her fatigue for many years now; however, she had COVID in May and feels like her symptoms have been worse since then. She's also having trouble staying asleep at night. She will wake up after about 3-4 hours and struggles to get back to sleep. Sometimes she just gets out of the bed to start her day. Her husband tells her that she snores loudly. She wakes up in the morning with a headache and dry mouth. She denies waking up gasping, witnessed apneas, sleep parasomnias/paralysis, drowsy driving, cataplexy.  She goes to bed around 6 PM.  Sometimes a few hours later.  Sleep onset varies; sometimes she has no difficulties and other times, it takes her a little while. She does take Xanax to help her fall asleep at night.  Does not necessarily feel anxious but she feels like her mind races.  Wakes up several times throughout the night; often to use the restroom.  Officially gets out of bed in the morning anytime between 1 AM and 5:15 AM. She has cut back on her caffeine intake throughout the day.  No previous sleep study Had gastric bypass in 2016, lost a significant amount of weight; however, she has slowly put weight back on over the past few years and is up about 40 pounds. She works as a Systems analyst GI. Never smoker. She does not drink alcohol. Lives at home with her husband. She has a history of atrial tachycardia and  followed by cardiology. She also sees Dr. Simona Huh with asthma/allergy for chronic cough and rhinitis. Her sister has sleep apnea and sleeps with CPAP. Home sleep study ordered.  11/17/2021: Today - follow up Patient presents today for follow-up to discuss home sleep study results.  Her sleep study revealed AHI of 11.7/h consistent with mild OSA.  She continues to struggle with daytime fatigue symptoms and restless sleep at night.  She has had trouble staying asleep for a long time now and usually wakes up after 3 to 4 hours.  She has also been told that she snores loudly.  Commonly wakes up in the morning with a headache or dry mouth.  She denies any sleep parasomnia/paralysis, drowsy driving.  No narcolepsy or cataplexy symptoms. She recently had some trouble with dizziness.  Feels like it may be vertigo.  It happened yesterday and has not happened since.  She denied any palpitations, syncope, headaches, vision changes. She also tells me that she has been struggling with shortness of breath over the past few months.  She had COVID in May and feels like her breathing has not been the greatest since.  She feels like she gets more winded with activity.  She does have an albuterol inhaler which she will use occasionally.  Last time she used it was yesterday.  She does have good response to this.  She denies any significant cough, wheezing, chest congestion, lower extremity swelling.  No significant pulmonary history.  No significant occupational or environmental exposures.  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   Estradiol Rash and Other (See Comments)    Local rash with estradiol patches    Immunization History  Administered Date(s) Administered   Influenza Split 11/22/2014   Influenza,inj,Quad PF,6+ Mos 11/12/2013, 12/16/2015   Influenza,inj,quad, With Preservative 10/27/2016   Influenza-Unspecified 11/29/2020    PFIZER(Purple Top)SARS-COV-2 Vaccination 03/19/2019, 04/08/2019, 01/08/2020   Tdap 01/10/2012, 07/12/2021   Zoster Recombinat (Shingrix) 02/01/2020, 04/07/2020    Past Medical History:  Diagnosis Date   Back pain    Bursitis    right shoulder   Chronic leukopenia    intermittant since 2010, followed by Dr. Renold Genta now   Constipation    Degenerative joint disease of low back 09/02/2015   Dr. Maureen Ralphs   Edema of both lower legs    Fibrocystic breast    Gallbladder problem    GERD (gastroesophageal reflux disease)    Headache    per pt more stress/tension   Heart palpitations    SEE EPIC ENCOUTNER , CARDIOLOGY DR. Tressia Miners TURNER 2018; reports on 05-16-17 " i haven't had any bouts of those lately"     Hemorrhoids    History of Clostridium difficile infection 12/2010   History of exercise intolerance    ETT on 04-27-2016-- negative ([facility] treadmill score 7)   History of Helicobacter pylori infection 2001 and 09/ 2012   HSV-2 infection    genital   Hx of adenomatous colonic polyps 10/17/2007   Hydradenitis    per pt currently treated with Humira injection   Hypothyroidism, postsurgical 1980   IBS (irritable bowel syndrome)    Joint pain    Medial meniscus tear    right knee   Mitral regurgitation    Mild to moderate by echo 09/2021   Osteoarthritis    Palpitations    Pernicious anemia    b12 def   PONV (postoperative nausea and vomiting)    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 18 g 3   guaiFENesin (MUCINEX) 600 MG 12 hr tablet Take 1 - 2 tablets by mouth twice daily as needed for drainage.  Take with lots of water 120 tablet 2   levocetirizine (XYZAL) 5 MG tablet Take 5 mg by mouth every evening.     montelukast (SINGULAIR) 10 MG tablet Take 1 tablet by mouth at bedtime. 30 tablet 3   Adalimumab (HUMIRA PEN) 40 MG/0.8ML PNKT Inject 1 pen subcutaneously every week 4 each 5   ALPRAZolam (XANAX) 1 MG tablet Take one tablet by mouth at bedtime. 90 tablet 0   Calcium Carb-Cholecalciferol (CALCIUM + D3 PO) Take 1 tablet by mouth daily.     celecoxib (CELEBREX) 200 MG capsule Take 1 capsule (  200 mg total) by mouth 2 (two) times daily as needed. 60 capsule 3   cyanocobalamin (DODEX) 1000 MCG/ML injection INJECT 1 ML INTRAMUSCULARLY EVERY MONTH 3 mL 11   desloratadine (CLARINEX) 5 MG tablet Take 1 tablet by mouth daily. (Patient not taking: Reported on 11/17/2021) 30 tablet 3   ergocalciferol (VITAMIN D2) 1.25 MG (50000 UT) capsule Take 1 capsule by mouth once a week. 12 capsule 1   Estradiol (DIVIGEL) 1.25 MG/1.25GM GEL Use one packet daily as directed 112.5 g 3   flecainide (TAMBOCOR) 50 MG tablet Take 1 tablet by mouth 2 times daily. 180 tablet 3   fluconazole (DIFLUCAN) 150 MG tablet Take one tablet by mouth every 72 hours x 3 doses, then take one tablet a week 15 tablet 1   gabapentin (NEURONTIN) 600 MG tablet Take 1 tablet (600 mg total) by mouth 3 (three) times daily. 90 tablet 5   levothyroxine (SYNTHROID) 112 MCG tablet TAKE 1 TABLET BY MOUTH ONCE A DAY 90 tablet 1   metoprolol succinate (TOPROL-XL) 25 MG 24 hr tablet TAKE 1 TABLET BY MOUTH DAILY. 90 tablet 3   Multiple Vitamin (MULTIVITAMIN WITH MINERALS) TABS tablet Take 1 tablet by mouth daily.     NURTEC 75 MG TBDP Dissolve one tablet by mouth daily as needed for migraine. Take as close to onset of migraine as possible. ONE DAILY  MAXIMUM.  Must be seen for further refills. 8 tablet 0   Olopatadine HCl 0.2 % SOLN Apply 1 drop to eye daily as needed. 2.5 mL 5   pantoprazole (PROTONIX) 40 MG tablet Take 1 tablet by mouth daily 20 minutes before breakfast 90 tablet 3   promethazine (PHENERGAN) 25 MG tablet TAKE 1 TABLET BY MOUTH EVERY 8 HOURS AS NEEDED FOR NAUSEA AND/OR VOMITING 30 tablet 5   sulfamethoxazole-trimethoprim (BACTRIM DS) 800-160 MG tablet Take 1 tablet twice daily as needed for flare. 60 tablet 1   tiZANidine (ZANAFLEX) 4 MG capsule Take 1 capsule (4 mg total) by mouth 3 (three) times daily as needed for muscle spasms. 90 capsule 1   Facility-Administered Medications Prior to Visit  Medication Dose Route Frequency Provider Last Rate Last Admin   gadobenate dimeglumine (MULTIHANCE) injection 20 mL  20 mL Intravenous Once PRN Melvenia Beam, MD         Review of Systems:   Constitutional: No night sweats, fevers, chills, or lassitude. +weight gain, excessive daytime sleepiness HEENT: No headaches, difficulty swallowing, tooth/dental problems, or sore throat. No sneezing, itching, ear ache. +nasal congestion (chronic) CV:  No chest pain, orthopnea, PND, swelling in lower extremities, anasarca, palpitations, syncope Resp: +snoring; shortness of breath with exertion (baseline). No excess mucus or change in color of mucus.  No cough.  Not no hemoptysis. No wheezing.  No chest wall deformity GI:  No heartburn, indigestion, abdominal pain, nausea, vomiting, diarrhea, change in bowel habits, loss of appetite, bloody stools.  GU: +nocturia. No dysuria, change in color of urine, urgency or frequency.  No flank pain, no hematuria  Skin: No rash, lesions, ulcerations Neuro: +dizziness. No gait abnormalities. No memory impairment.  Psych: No depression or anxiety. Mood stable. +sleep disturbance    Physical Exam:  BP 100/70 (BP Location: Right Arm)   Pulse 61   Ht '5\' 6"'$  (1.676 m)   Wt 280 lb 3.2 oz (127.1 kg)    SpO2 98%   BMI 45.23 kg/m   GEN: Pleasant, interactive, well-appearing; morbidly obese; in no acute distress. HEENT:  Normocephalic and atraumatic. PERRLA. Sclera white. Nasal turbinates pink, moist and patent bilaterally. Clear rhinorrhea present. Oropharynx pink and moist, without exudate or edema. No lesions, ulcerations, or postnasal drip. Mallampati II NECK:  Supple w/ fair ROM.  CV: RRR, no m/r/g, no peripheral edema. Pulses intact, +2 bilaterally. No cyanosis, pallor or clubbing. PULMONARY:  Unlabored, regular breathing. Clear bilaterally A&P w/o wheezes/rales/rhonchi. No accessory muscle use. No dullness to percussion. GI: BS present and normoactive. Soft, non-tender to palpation.  MSK: No erythema, warmth or tenderness.  Neuro: A/Ox3. No focal deficits noted.   Skin: Warm, no lesions or rashe Psych: Normal affect and behavior. Judgement and thought content appropriate.     Lab Results:  CBC    Component Value Date/Time   WBC 4.4 08/01/2021 1417   RBC 4.19 08/01/2021 1417   HGB 13.6 08/01/2021 1417   HGB 11.8 (L) 07/12/2021 1310   HGB 10.9 (L) 09/18/2018 0853   HGB 12.6 07/09/2016 1529   HCT 41.5 08/01/2021 1417   HCT 35.3 09/18/2018 0853   HCT 38.2 07/09/2016 1529   PLT 217 08/01/2021 1417   PLT 201 07/12/2021 1310   PLT 233 09/18/2018 0853   MCV 99.0 08/01/2021 1417   MCV 94 09/18/2018 0853   MCV 96.9 07/09/2016 1529   MCH 32.5 08/01/2021 1417   MCHC 32.8 08/01/2021 1417   RDW 13.2 08/01/2021 1417   RDW 14.8 09/18/2018 0853   RDW 13.5 07/09/2016 1529   LYMPHSABS 1.3 08/01/2021 1417   LYMPHSABS 1.3 09/18/2018 0853   LYMPHSABS 1.6 07/09/2016 1529   MONOABS 0.4 08/01/2021 1417   MONOABS 0.3 07/09/2016 1529   EOSABS 0.1 08/01/2021 1417   EOSABS 0.2 09/18/2018 0853   BASOSABS 0.0 08/01/2021 1417   BASOSABS 0.0 09/18/2018 0853   BASOSABS 0.0 07/09/2016 1529    BMET    Component Value Date/Time   NA 140 08/01/2021 1417   K 3.7 08/01/2021 1417   CL 107  08/01/2021 1417   CO2 24 08/01/2021 1417   GLUCOSE 97 08/01/2021 1417   BUN 11 08/01/2021 1417   CREATININE 0.89 08/01/2021 1417   CREATININE 0.94 06/29/2021 1105   CALCIUM 8.2 (L) 08/01/2021 1417   GFRNONAA >60 08/01/2021 1417   GFRNONAA 93 06/30/2020 1105   GFRAA 108 06/30/2020 1105    BNP No results found for: "BNP"   Imaging:  No results found.  lidocaine (XYLOCAINE) 1 % (with pres) injection 3 mL     Date Action Dose Route User   10/18/2021 0908 Given 3 mL Other (Right Knee) Mcarthur Rossetti, MD      methylPREDNISolone acetate (DEPO-MEDROL) injection 40 mg     Date Action Dose Route User   10/18/2021 0908 Given 40 mg Intra-articular (Right Knee) Mcarthur Rossetti, MD           No data to display          No results found for: "NITRICOXIDE"      Assessment & Plan:   Mild obstructive sleep apnea Mild OSA with AHI 11.7/h. We discussed potential treatment options including weight loss, side sleeping position, oral appliance, CPAP therapy. She would like to move forward with CPAP therapy. New start order sent for CPAP 5-15 cmH2O, nasal pillow mask, and heated humidification.   Patient Instructions  Start CPAP auto 5-15 cmH2O with nasal pillow mask every night, minimum of 4-6 hours a night.  Change equipment every 30 days or as directed by DME. Wash your tubing with warm  soap and water daily, hang to dry. Wash humidifier portion weekly.  Be aware of reduced alertness and do not drive or operate heavy machinery if experiencing this or drowsiness.  Exercise encouraged, as tolerated. Avoid or decrease alcohol consumption and medications that make you more sleepy, if possible.  Meclizine over the counter 12.5-25 mg every 6-12 hours as needed for vertigo Make sure you are staying well hydrated. Stand slowly  Monitor your blood pressure and heart rate, especially when you become dizzy. May need to adjust your metoprolol.  Follow up with Dr Renold Genta if  dizziness persists   Continue Albuterol inhaler 2 puffs every 6 hours as needed for shortness of breath or wheezing  Pulmonary function testing scheduled today   Follow up in 8-12 weeks with Dr. Halford Chessman (new patient slot) or Roxan Diesel NP after PFTs and starting on CPAP. If symptoms do not improve or worsen, please contact office for sooner follow up or seek emergency care.     Insomnia She struggles with sleep maintenance. Possible this is related to untreated OSA. If symptoms persist despite consistent use of CPAP, we will look at pharmacological therapy.   Shortness of breath Worsening after COVID in May. She does have good response to albuterol. Possible underlying asthma component vs ILD r/t COVID. We will order PFTs for further evaluation and then determine need for chest imaging.   Morbid obesity ([place]) Healthy weight loss encouraged. Discussed this is likely contributing to her dyspnea. She is working on exercising more and diet measures.   Vertigo Possible vertigo. Recommended she try meclizine. She is on a beta blocker, which may need to be adjusted. HR and BP nl today. Advised her to monitor her symptoms and VS. She will follow up with her PCP if problem persists.     I spent 42 minutes of dedicated to the care of this patient on the date of this encounter to include pre-visit review of records, face-to-face time with the patient discussing conditions above, post visit ordering of testing, clinical documentation with the electronic health record, making appropriate referrals as documented, and communicating necessary findings to members of the patients care team.  Clayton Bibles, NP 11/17/2021  Pt aware and understands NP's role.

## 2021-11-17 NOTE — Assessment & Plan Note (Signed)
She struggles with sleep maintenance. Possible this is related to untreated OSA. If symptoms persist despite consistent use of CPAP, we will look at pharmacological therapy.

## 2021-11-17 NOTE — Progress Notes (Signed)
Reviewed and agree with assessment/plan.   Chesley Mires, MD [name] Pulmonary/Critical Care 11/17/2021, 1:33 PM Pager:  [phone]

## 2021-11-17 NOTE — Assessment & Plan Note (Signed)
Possible vertigo. Recommended she try meclizine. She is on a beta blocker, which may need to be adjusted. HR and BP nl today. Advised her to monitor her symptoms and VS. She will follow up with her PCP if problem persists.

## 2021-11-17 NOTE — Assessment & Plan Note (Signed)
Worsening after COVID in May. She does have good response to albuterol. Possible underlying asthma component vs ILD r/t COVID. We will order PFTs for further evaluation and then determine need for chest imaging.

## 2021-11-17 NOTE — Patient Instructions (Addendum)
Start CPAP auto 5-15 cmH2O with nasal pillow mask every night, minimum of 4-6 hours a night.  Change equipment every 30 days or as directed by DME. Wash your tubing with warm soap and water daily, hang to dry. Wash humidifier portion weekly.  Be aware of reduced alertness and do not drive or operate heavy machinery if experiencing this or drowsiness.  Exercise encouraged, as tolerated. Avoid or decrease alcohol consumption and medications that make you more sleepy, if possible.  Meclizine over the counter 12.5-25 mg every 6-12 hours as needed for vertigo Make sure you are staying well hydrated. Stand slowly  Monitor your blood pressure and heart rate, especially when you become dizzy. May need to adjust your metoprolol.  Follow up with Dr Renold Genta if dizziness persists   Continue Albuterol inhaler 2 puffs every 6 hours as needed for shortness of breath or wheezing  Pulmonary function testing scheduled today   Follow up in 8-12 weeks with Dr. Halford Chessman (new patient slot) or Roxan Diesel NP after PFTs and starting on CPAP. If symptoms do not improve or worsen, please contact office for sooner follow up or seek emergency care.

## 2021-11-17 NOTE — Assessment & Plan Note (Signed)
Mild OSA with AHI 11.7/h. We discussed potential treatment options including weight loss, side sleeping position, oral appliance, CPAP therapy. She would like to move forward with CPAP therapy. New start order sent for CPAP 5-15 cmH2O, nasal pillow mask, and heated humidification.   Patient Instructions  Start CPAP auto 5-15 cmH2O with nasal pillow mask every night, minimum of 4-6 hours a night.  Change equipment every 30 days or as directed by DME. Wash your tubing with warm soap and water daily, hang to dry. Wash humidifier portion weekly.  Be aware of reduced alertness and do not drive or operate heavy machinery if experiencing this or drowsiness.  Exercise encouraged, as tolerated. Avoid or decrease alcohol consumption and medications that make you more sleepy, if possible.  Meclizine over the counter 12.5-25 mg every 6-12 hours as needed for vertigo Make sure you are staying well hydrated. Stand slowly  Monitor your blood pressure and heart rate, especially when you become dizzy. May need to adjust your metoprolol.  Follow up with Dr Renold Genta if dizziness persists   Continue Albuterol inhaler 2 puffs every 6 hours as needed for shortness of breath or wheezing  Pulmonary function testing scheduled today   Follow up in 8-12 weeks with Dr. Halford Chessman (new patient slot) or Roxan Diesel NP after PFTs and starting on CPAP. If symptoms do not improve or worsen, please contact office for sooner follow up or seek emergency care.

## 2021-11-20 ENCOUNTER — Encounter (INDEPENDENT_AMBULATORY_CARE_PROVIDER_SITE_OTHER): Payer: Self-pay | Admitting: Internal Medicine

## 2021-11-20 ENCOUNTER — Ambulatory Visit (INDEPENDENT_AMBULATORY_CARE_PROVIDER_SITE_OTHER): Payer: No Typology Code available for payment source | Admitting: Internal Medicine

## 2021-11-20 VITALS — BP 100/64 | HR 55 | Temp 97.5°F | Ht 66.0 in | Wt 275.0 lb

## 2021-11-20 DIAGNOSIS — E559 Vitamin D deficiency, unspecified: Secondary | ICD-10-CM | POA: Diagnosis not present

## 2021-11-20 DIAGNOSIS — E611 Iron deficiency: Secondary | ICD-10-CM

## 2021-11-20 DIAGNOSIS — E162 Hypoglycemia, unspecified: Secondary | ICD-10-CM | POA: Diagnosis not present

## 2021-11-20 DIAGNOSIS — Z1331 Encounter for screening for depression: Secondary | ICD-10-CM | POA: Diagnosis not present

## 2021-11-20 DIAGNOSIS — K912 Postsurgical malabsorption, not elsewhere classified: Secondary | ICD-10-CM

## 2021-11-20 DIAGNOSIS — R0602 Shortness of breath: Secondary | ICD-10-CM | POA: Diagnosis not present

## 2021-11-20 DIAGNOSIS — R5383 Other fatigue: Secondary | ICD-10-CM

## 2021-11-20 DIAGNOSIS — Z6841 Body Mass Index (BMI) 40.0 and over, adult: Secondary | ICD-10-CM

## 2021-11-20 DIAGNOSIS — Z9884 Bariatric surgery status: Secondary | ICD-10-CM

## 2021-11-20 MED ORDER — PRENATAL MULTIVITAMIN CH: 1.0000 | ORAL_TABLET | Freq: Two times a day (BID) | ORAL | Status: DC

## 2021-11-21 DIAGNOSIS — Z6841 Body Mass Index (BMI) 40.0 and over, adult: Secondary | ICD-10-CM | POA: Insufficient documentation

## 2021-11-21 DIAGNOSIS — E559 Vitamin D deficiency, unspecified: Secondary | ICD-10-CM | POA: Insufficient documentation

## 2021-11-21 DIAGNOSIS — R0602 Shortness of breath: Secondary | ICD-10-CM | POA: Insufficient documentation

## 2021-11-21 DIAGNOSIS — E611 Iron deficiency: Secondary | ICD-10-CM | POA: Insufficient documentation

## 2021-11-21 DIAGNOSIS — Z1331 Encounter for screening for depression: Secondary | ICD-10-CM | POA: Insufficient documentation

## 2021-11-21 DIAGNOSIS — R5383 Other fatigue: Secondary | ICD-10-CM | POA: Insufficient documentation

## 2021-11-21 DIAGNOSIS — K912 Postsurgical malabsorption, not elsewhere classified: Secondary | ICD-10-CM | POA: Insufficient documentation

## 2021-11-22 NOTE — Progress Notes (Signed)
GYNECOLOGY  VISIT   HPI: 61 y.o.   Married Black or [place] American Not Hispanic or Latino  female   [phone] with No LMP recorded. Patient has had a hysterectomy.   here for vaginal infection.  Complains of vaginal discharge, itching (took diflucan yesterday--taking once a week x's 6 months, since last ov 10/09/21).  Her discharge and itching have been for a few weeks. The d/c is tan and thick, no clumpy. The itching is bad.   She has had a gastric bypass.   H/O DIV was previously treated with vaginal steroids x 2  then clindamycin. The clindamycin helped, she finished that treatment ~1 year ago.    In 5/23, 6/23, and 10/09/21 she was treated for yeast, currently on suppression   She has a h/o hysterectomy. On divigel for ERT.   Urine has strong urine odor, but she is dehydrated. Urine is dark. Work is very busy and she doesn't have time to hydrate.  She has chronic back and knee pain. Needs a knee replacement, but has to loose weight first. Exercise is difficult because she is in pain.   GYNECOLOGIC HISTORY: No LMP recorded. Patient has had a hysterectomy. Contraception: hysterectomy. Menopausal hormone therapy: Divigel.        OB History     Gravida  2   Para  1   Term      Preterm  1   AB  1   Living  2      SAB      IAB  1   Ectopic      Multiple  1   Live Births  2              Patient Active Problem List   Diagnosis Date Noted   SOBOE (shortness of breath on exertion) 11/21/2021   Other fatigue 11/21/2021   Vitamin D deficiency 11/21/2021   Iron deficiency 11/21/2021   Hypoglycemia after bariatric surgery 11/21/2021   Depression screening 11/21/2021   Class 3 severe obesity with serious comorbidity and body mass index (BMI) of 40.0 to 44.9 in adult ([place]) 11/21/2021   Mild obstructive sleep apnea 11/17/2021   Vertigo 11/17/2021   Excessive daytime sleepiness 10/23/2021   Spinal stenosis of lumbar region 07/07/2020   Unilateral primary  osteoarthritis, right knee 06/08/2020   Displacement of lumbar intervertebral disc 06/02/2020   Lumbar spondylosis 06/02/2020   Lumbar stenosis with neurogenic claudication 06/02/2020   Arm numbness 05/30/2020   Chronic neck pain 05/30/2020   Other chronic pain 05/30/2020   Elevated blood-pressure reading, without diagnosis of hypertension 05/30/2020   Status post arthroscopy of right knee 12/11/2019   Polyethylene wear of left knee joint prosthesis ([place]) 09/09/2019   Acute medial meniscal tear, right, subsequent encounter 08/27/2019   History of total left knee replacement 07/16/2019   Peroneal neuropathy at knee, left 06/25/2019   Lumbosacral radiculopathy 06/07/2019   Iron malabsorption 10/16/2018   IDA (iron deficiency anemia) 10/16/2018   OA (osteoarthritis) of knee 01/20/2018   Acute meniscal tear, medial 07/17/2017   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   Failed total hip arthroplasty, 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/2017   Pain in left knee 04/11/2017   Insomnia 11/08/2016   Pernicious anemia 11/08/2016   Leukopenia 07/09/2016   Hypocupremia 07/09/2016   Palpitations 04/12/2016   Shortness of breath 04/12/2016   Reactive hypoglycemia 10/31/2015   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 Renold Genta now   Chronic neck pain    Constipation    Degenerative joint disease of low back 09/02/2015   Dr. Maureen Ralphs   Displacement of lumbar intervertebral disc    Edema of both lower legs     Fibrocystic breast    Gallbladder problem    GERD (gastroesophageal reflux disease)    Headache    per pt more stress/tension   Heart palpitations    SEE EPIC ENCOUTNER , CARDIOLOGY DR. Tressia Miners TURNER 2018; reports on 05-16-17 " i haven't had any bouts of those lately"     Hemorrhoids    Hidradenitis suppurativa    History of Clostridium difficile infection 12/2010   History of exercise intolerance    ETT on 04-27-2016-- negative ([facility] treadmill score 7)   History of Helicobacter pylori infection 2001 and 09/ 2012   HSV-2 infection    genital   Hx of adenomatous colonic polyps 10/17/2007   Hydradenitis    per pt currently treated with Humira injection   Hypocupremia    Hypothyroidism    Hypothyroidism, postsurgical 1980   IBS (irritable bowel syndrome)    Insomnia    Iron deficiency    Joint pain    Leukopenia    Lumbosacral radiculopathy    Medial meniscus tear    right knee   Mild obstructive sleep apnea    Mitral regurgitation    Mild to moderate by echo 09/2021   Numbness and tingling of right arm    Obesity    Osteoarthritis    Palpitations    Pernicious anemia    b12 def   Peroneal neuropathy    PONV (postoperative nausea and vomiting)    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   Prediabetes    Reactive hypoglycemia followed by pcp   post gastrectomy dumping syndrome   SOB (shortness of breath)    Spinal stenosis    Tendonitis    right shoulder   Varicose veins    Vertigo    Vitamin B 12 deficiency    Vitamin D deficiency     Past Surgical History:  Procedure Laterality Date   BREATH TEK H PYLORI N/A 05/03/2014   Procedure: BREATH TEK H PYLORI;  Surgeon: Kaylyn Lim, MD;  Location: [facility];  Service: General;  Laterality: N/A;   CARDIOVASCULAR STRESS TEST  03/11/2013   dr Tressia Miners turner   Low risk nuclear study w/ a very small anterior perfusion defect that most likely represents breast attenuation artifact, less likely this could represent a true  perfusion abnormality in the distribution of diagonal branch of the LAD/  normal LV function and wall motion , ef 66%   CATARACT EXTRACTION W/ INTRAOCULAR LENS  IMPLANT, BILATERAL  10/2017   [place]   COLONOSCOPY  02/2017   Dr [name] ; per patient they found 7 polyps with polypectomy; on 5 year track    ESOPHAGOGASTRODUODENOSCOPY  02/01/2021   FINE NEEDLE ASPIRATION Left 05/22/2017   Procedure: LEFT KNEE ASPIRATION;  Surgeon: Wynelle Link,  Pilar Plate, MD;  Location: [facility];  Service: Orthopedics;  Laterality: Left;   GASTRIC ROUX-EN-Y N/A 07/06/2014   Procedure: LAPAROSCOPIC ROUX-EN-Y GASTRIC BYPASS, ENTEROLYSIS OF ADHESIONS WITH UPPER ENDOSCOPY;  Surgeon: Johnathan Hausen, MD;  Location: [facility];  Service: General;  Laterality: N/A;   HAMMER TOE SURGERY Bilateral 02/17/2008   bilateral foot digit's 2 and 5   HIATAL HERNIA REPAIR N/A 07/06/2014   Procedure: LAPAROSCOPIC REPAIR OF HIATAL HERNIA;  Surgeon: Johnathan Hausen, MD;  Location: [facility];  Service: General;  Laterality: N/A;   I & D KNEE WITH POLY EXCHANGE Left 12/11/2019   Procedure: LEFT KNEE POLY-LINER EXCHANGE;  Surgeon: Mcarthur Rossetti, MD;  Location: [place];  Service: Orthopedics;  Laterality: Left;   KNEE ARTHROSCOPY Right 12/11/2019   Procedure: RIGHT KNEE ARTHROSCOPY WITH PARTIAL MEDIAL MENISCECTOMY;  Surgeon: Mcarthur Rossetti, MD;  Location: [place];  Service: Orthopedics;  Laterality: Right;   KNEE ARTHROSCOPY WITH MEDIAL MENISECTOMY Left 07/17/2017   Procedure: LEFT KNEE ARTHROSCOPY WITH MEDIAL LATERAL MENISECTOMY;  Surgeon: Gaynelle Arabian, MD;  Location: [facility];  Service: Orthopedics;  Laterality: Left;   MASS EXCISION N/A 10/11/2016   Procedure: EXCISION OF PERIANAL MASS;  Surgeon: [name], MD;  Location: [place];  Service: General;  Laterality: N/A;   [place]  08-25-2002   dr Margaretha Glassing    ovaries retained   TOTAL HIP ARTHROPLASTY Right 05/21/2012   Procedure: TOTAL HIP ARTHROPLASTY;  Surgeon: Gearlean Alf, MD;  Location: [facility];  Service: Orthopedics;  Laterality: Right;   TOTAL HIP ARTHROPLASTY Left 01-31-2009  dr Wynelle Link   TOTAL HIP REVISION Left 05/22/2017   Procedure: Left hip bearing surface and head revision;  Surgeon: Gaynelle Arabian, MD;  Location[facility];  Service: Orthopedics;  Laterality: Left;   TOTAL KNEE ARTHROPLASTY Left 01/20/2018   Procedure: LEFT TOTAL KNEE ARTHROPLASTY;  Surgeon: Gaynelle Arabian, MD;  [facility];  Service: Orthopedics;  Laterality: Left;  25mn   TRANSTHORACIC ECHOCARDIOGRAM  05/03/2016   ef 55-60%/  trivial MR/  mild TR   TUBAL LIGATION      Current Outpatient Medications  Medication Sig Dispense Refill   Adalimumab (HUMIRA PEN) 40 MG/0.8ML PNKT Inject 1 pen subcutaneously every week 4 each 5   albuterol (VENTOLIN HFA) 108 (90 Base) MCG/ACT inhaler Inhale 2 puffs into the lungs every 4 hours as needed for wheezing or shortness of breath. 18 g 3   ALPRAZolam (XANAX) 1 MG tablet Take one tablet by mouth at bedtime. 90 tablet 0   Calcium Carb-Cholecalciferol (CALCIUM + D3 PO) Take 1 tablet by mouth daily.     celecoxib (CELEBREX) 200 MG capsule Take 1 capsule (200 mg total) by mouth 2 (two) times daily as needed. 60 capsule 3   cyanocobalamin (DODEX) 1000 MCG/ML injection INJECT 1 ML INTRAMUSCULARLY EVERY MONTH 3 mL 11   desloratadine (CLARINEX) 5 MG tablet Take 1 tablet by mouth daily. (Patient not taking: Reported on 11/17/2021) 30 tablet 3   ergocalciferol (VITAMIN D2) 1.25 MG (50000 UT) capsule Take 1 capsule by mouth once a week. 12 capsule 1   Estradiol (DIVIGEL) 1.25 MG/1.25GM GEL Use one packet daily as directed 112.5 g 3   flecainide (TAMBOCOR) 50 MG tablet Take 1 tablet by mouth 2 times daily. 180 tablet 3   fluconazole (DIFLUCAN) 150 MG tablet Take one tablet by mouth every 72 hours x 3 doses, then  take one tablet a week 15  tablet 1   gabapentin (NEURONTIN) 600 MG tablet Take 1 tablet (600 mg total) by mouth 3 (three) times daily. 90 tablet 5   guaiFENesin (MUCINEX) 600 MG 12 hr tablet Take 1 - 2 tablets by mouth twice daily as needed for drainage.  Take with lots of water 120 tablet 2   levocetirizine (XYZAL) 5 MG tablet Take 5 mg by mouth every evening.     levothyroxine (SYNTHROID) 112 MCG tablet TAKE 1 TABLET BY MOUTH ONCE A DAY 90 tablet 1   metoprolol succinate (TOPROL-XL) 25 MG 24 hr tablet TAKE 1 TABLET BY MOUTH DAILY. 90 tablet 3   montelukast (SINGULAIR) 10 MG tablet Take 1 tablet by mouth at bedtime. 30 tablet 3   NURTEC 75 MG TBDP Dissolve one tablet by mouth daily as needed for migraine. Take as close to onset of migraine as possible. ONE DAILY MAXIMUM.  Must be seen for further refills. 8 tablet 0   Olopatadine HCl 0.2 % SOLN Apply 1 drop to eye daily as needed. 2.5 mL 5   pantoprazole (PROTONIX) 40 MG tablet Take 1 tablet by mouth daily 20 minutes before breakfast 90 tablet 3   Prenatal Vit-Fe Fumarate-FA (PRENATAL MULTIVITAMIN) TABS tablet Take 1 tablet by mouth 2 (two) times daily with a meal.     promethazine (PHENERGAN) 25 MG tablet TAKE 1 TABLET BY MOUTH EVERY 8 HOURS AS NEEDED FOR NAUSEA AND/OR VOMITING 30 tablet 5   sulfamethoxazole-trimethoprim (BACTRIM DS) 800-160 MG tablet Take 1 tablet twice daily as needed for flare. 60 tablet 1   tiZANidine (ZANAFLEX) 4 MG capsule Take 1 capsule (4 mg total) by mouth 3 (three) times daily as needed for muscle spasms. 90 capsule 1   No current facility-administered medications for this visit.   Facility-Administered Medications Ordered in Other Visits  Medication Dose Route Frequency Provider Last Rate Last Admin   gadobenate dimeglumine (MULTIHANCE) injection 20 mL  20 mL Intravenous Once PRN Melvenia Beam, MD         ALLERGIES: Other and Estradiol  Family History  Problem Relation Age of Onset   High blood pressure Mother    Hypertension Mother     Headache Mother    High Cholesterol Mother    Thyroid disease Mother    Obesity Mother    Kidney disease Mother    Diabetes Father    High blood pressure Father    High Cholesterol Father    Heart disease Father    Obesity Father    Diabetes Sister    Hypertension Sister     Social History   Socioeconomic History   Marital status: Married    Spouse name: Barbaraann Rondo   Number of children: 2   Years of education: Not on file   Highest education level: Not on file  Occupational History   Occupation: Therapist, sports  Tobacco Use   Smoking status: Never   Smokeless tobacco: Never  Vaping Use   Vaping Use: Never used  Substance and Sexual Activity   Alcohol use: No   Drug use: No   Sexual activity: Not Currently    Partners: Male    Birth control/protection: Surgical    Comment: hysterectomy  Other Topics Concern   Not on file  Social History Narrative   Lives at home with spouse   Right handed   Caffeine: diet zero mtn dew, occasionally    Social Determinants of Health   Financial Resource Strain:  Not on file  Food Insecurity: Not on file  Transportation Needs: Not on file  Physical Activity: Not on file  Stress: Not on file  Social Connections: Not on file  Intimate Partner Violence: Not on file    Review of Systems  All other systems reviewed and are negative.   PHYSICAL EXAMINATION:    There were no vitals taken for this visit.    General appearance: alert, cooperative and appears stated age  Pelvic: External genitalia:  no lesions, mild erythema              Urethra:  normal appearing urethra with no masses, tenderness or lesions              Bartholins and Skenes: normal                 Vagina: mildly atrophic appearing vagina with an increase in thin, yellow, slightly frothy vaginal discharge, mild erythema.               Cervix: absent               Chaperone was present for exam.  1. Acute vaginitis - WET PREP FOR TRICH, YEAST, CLUE  2. Recurrent  vaginitis She has developed yeast while on diflucan suppression. I suspect she isn't absorbing the diflucan well secondary to her gastric bypass. Will treat with vaginal cream.  - terconazole (TERAZOL 7) 0.4 % vaginal cream; Place 1 applicator vaginally at bedtime. One applicator full QHS for seven days of therapy, then use one applicator full a week for 6 months.  Dispense: 45 g; Refill: 4 - clobetasol ointment (TEMOVATE) 0.05 %; Apply 1 Application topically 2 (two) times daily. Apply as directed twice daily for up to 2 weeks as needed  Dispense: 60 g; Refill: 0

## 2021-11-24 ENCOUNTER — Encounter: Payer: Self-pay | Admitting: Obstetrics and Gynecology

## 2021-11-24 ENCOUNTER — Ambulatory Visit (INDEPENDENT_AMBULATORY_CARE_PROVIDER_SITE_OTHER): Payer: No Typology Code available for payment source | Admitting: Obstetrics and Gynecology

## 2021-11-24 ENCOUNTER — Other Ambulatory Visit (HOSPITAL_COMMUNITY): Payer: Self-pay

## 2021-11-24 VITALS — BP 116/78

## 2021-11-24 DIAGNOSIS — N760 Acute vaginitis: Secondary | ICD-10-CM | POA: Diagnosis not present

## 2021-11-24 LAB — WET PREP FOR TRICH, YEAST, CLUE

## 2021-11-24 MED ORDER — TERCONAZOLE 0.4 % VA CREA
1.0000 | TOPICAL_CREAM | Freq: Every day | VAGINAL | 4 refills | Status: DC
  Filled 2021-11-24: qty 45, 7d supply, fill #0
  Filled 2021-12-14: qty 45, 7d supply, fill #1
  Filled 2022-03-28: qty 45, 7d supply, fill #2
  Filled 2022-06-06: qty 45, 7d supply, fill #3

## 2021-11-24 MED ORDER — CLOBETASOL PROPIONATE 0.05 % EX OINT
1.0000 | TOPICAL_OINTMENT | Freq: Two times a day (BID) | CUTANEOUS | 0 refills | Status: DC
  Filled 2021-11-24: qty 60, 30d supply, fill #0

## 2021-11-24 NOTE — Patient Instructions (Signed)
Use the vaginal cream nightly for one week, then 1 x a week for 6 months.

## 2021-11-24 NOTE — Telephone Encounter (Signed)
Completed f/u on 9/22.  Nothing further needed.

## 2021-11-26 LAB — VITAMIN E
Vitamin E (Alpha Tocopherol): 11.2 mg/L (ref 9.0–29.0)
Vitamin E(Gamma Tocopherol): 1 mg/L (ref 0.5–4.9)

## 2021-11-26 LAB — COPPER, SERUM: Copper: 163 ug/dL — ABNORMAL HIGH (ref 80–158)

## 2021-11-26 LAB — MAGNESIUM: Magnesium: 2.5 mg/dL — ABNORMAL HIGH (ref 1.6–2.3)

## 2021-11-26 LAB — CMP14+EGFR
ALT: 16 IU/L (ref 0–32)
AST: 15 IU/L (ref 0–40)
Albumin/Globulin Ratio: 1.2 (ref 1.2–2.2)
Albumin: 4 g/dL (ref 3.8–4.9)
Alkaline Phosphatase: 57 IU/L (ref 44–121)
BUN/Creatinine Ratio: 15 (ref 12–28)
BUN: 14 mg/dL (ref 8–27)
Bilirubin Total: 0.2 mg/dL (ref 0.0–1.2)
CO2: 22 mmol/L (ref 20–29)
Calcium: 8.6 mg/dL — ABNORMAL LOW (ref 8.7–10.3)
Chloride: 104 mmol/L (ref 96–106)
Creatinine, Ser: 0.95 mg/dL (ref 0.57–1.00)
Globulin, Total: 3.3 g/dL (ref 1.5–4.5)
Glucose: 85 mg/dL (ref 70–99)
Potassium: 4.1 mmol/L (ref 3.5–5.2)
Sodium: 141 mmol/L (ref 134–144)
Total Protein: 7.3 g/dL (ref 6.0–8.5)
eGFR: 69 mL/min/{1.73_m2} (ref >59)

## 2021-11-26 LAB — HEMOGLOBIN A1C
Est. average glucose Bld gHb Est-mCnc: 117 mg/dL
Hgb A1c MFr Bld: 5.7 % — ABNORMAL HIGH (ref 4.8–5.6)

## 2021-11-26 LAB — VITAMIN B12: Vitamin B-12: 578 pg/mL (ref 232–1245)

## 2021-11-26 LAB — VITAMIN K1, SERUM: VITAMIN K1: 0.14 ng/mL (ref 0.10–2.20)

## 2021-11-26 LAB — FOLATE RBC
Folate, Hemolysate: 310 ng/mL
Folate, RBC: 726 ng/mL (ref >498)
Hematocrit: 42.7 % (ref 34.0–46.6)

## 2021-11-26 LAB — VITAMIN D 25 HYDROXY (VIT D DEFICIENCY, FRACTURES): Vit D, 25-Hydroxy: 29.4 ng/mL — ABNORMAL LOW (ref 30.0–100.0)

## 2021-11-26 LAB — VITAMIN A: Vitamin A: 28.1 ug/dL (ref 22.0–69.5)

## 2021-11-26 LAB — PARATHYROID HORMONE, INTACT (NO CA): PTH: 101 pg/mL — ABNORMAL HIGH (ref 15–65)

## 2021-11-26 LAB — INSULIN, RANDOM: INSULIN: 10 u[IU]/mL (ref 2.6–24.9)

## 2021-11-26 LAB — ZINC: Zinc: 83 ug/dL (ref 44–115)

## 2021-11-27 ENCOUNTER — Other Ambulatory Visit (HOSPITAL_COMMUNITY): Payer: Self-pay

## 2021-11-28 ENCOUNTER — Other Ambulatory Visit (HOSPITAL_COMMUNITY): Payer: Self-pay

## 2021-11-28 ENCOUNTER — Telehealth: Payer: Self-pay | Admitting: Internal Medicine

## 2021-11-28 ENCOUNTER — Encounter: Payer: Self-pay | Admitting: Internal Medicine

## 2021-11-28 ENCOUNTER — Ambulatory Visit (INDEPENDENT_AMBULATORY_CARE_PROVIDER_SITE_OTHER): Payer: No Typology Code available for payment source | Admitting: Internal Medicine

## 2021-11-28 VITALS — Temp 97.3°F | Ht 66.0 in | Wt 279.8 lb

## 2021-11-28 DIAGNOSIS — F411 Generalized anxiety disorder: Secondary | ICD-10-CM

## 2021-11-28 DIAGNOSIS — R5383 Other fatigue: Secondary | ICD-10-CM | POA: Diagnosis not present

## 2021-11-28 DIAGNOSIS — R7989 Other specified abnormal findings of blood chemistry: Secondary | ICD-10-CM

## 2021-11-28 DIAGNOSIS — F439 Reaction to severe stress, unspecified: Secondary | ICD-10-CM | POA: Diagnosis not present

## 2021-11-28 DIAGNOSIS — G4700 Insomnia, unspecified: Secondary | ICD-10-CM | POA: Diagnosis not present

## 2021-11-28 MED ORDER — LORAZEPAM 2 MG PO TABS
2.0000 mg | ORAL_TABLET | Freq: Every day | ORAL | 2 refills | Status: DC
  Filled 2021-11-28: qty 30, 30d supply, fill #0
  Filled 2021-12-26: qty 30, 30d supply, fill #1
  Filled 2022-01-30: qty 30, 30d supply, fill #2

## 2021-11-28 NOTE — Progress Notes (Signed)
   Subjective:    Patient ID: Victoria [name], female    DOB: 26-Apr-1960, 61 y.o.   MRN: [id number]  HPI  Has had episodes of dizziness and continues with unexplained fatigue.  Generally does not feel well.  Work is stressful.  Having more issues with migraine headaches.  Has persistent back pain and went to see Victoria [name].  He is to have MRI of C-spine and LS-spine in the near future.  Recently had considerable lab studies done at [facility] by Dr. Gerarda Fraction and has an elevated serum calcium.  Multiple other test were done including B12 which was normal.  She does take vitamin B-12 monthly injections.  Vitamin D was slightly low at 29.4.  Copper level was elevated at 163 but I am not sure that that is significantly elevated.  Magnesium was slightly high at 2.5.  Review of Systems has seen Dr. Jaynee Eagles and has had more migraines recently  Has had recurrent  vaginal Candida infections again.  Tired more than worried.Does not sleep well. Situational stress with work. Has orthopedic issues  and back issues contributing to fatigue.     Objective:   Physical Exam Vital signs are reviewed.  Chest clear.  Cardiac exam: Regular rate and rhythm.  No thyromegaly.  Spent some 30 minutes speaking with her about the symptoms and stress at work.  What stands out to me in the lab work is an elevated parathyroid hormone level.  This needs to be evaluated.       Assessment & Plan:  Persistent musculoskeletal pain neck and lumbar spine.  To have MRI by Victoria [name] in the near future  Fatigue  Elevated parathyroid hormone level of 101.  This will be repeated with further instructions to follow-up.  She may have hyperparathyroidism causing some of the symptoms.  Dizziness  Anxiety and depression  Persistent insomnia-try lorazepam 2 mg at bedtime.  Some of this may be due to situational stress and worry  Addendum: MRI of the LS spine done on October 10 shows mild disc  desiccation and bulging at L3-L4 and L5-S1 without significant stenosis.  Mild facet hypertrophy at L4-L5 worse on the right.  No focal stenosis to explain symptoms.  MRI of C-spine shows multilevel cervical spondylosis with mild spinal stenosis C3-C4 and C5-C6.  Mild left C5 foraminal narrowing.  Small left paracentral disc protrusion C3-C4 and probable subtle tiny left foraminal disc protrusion C7-through T1 which may be affecting left C8 nerve root.  Time spent evaluating patient in the office, reviewing lab work done at [facility], counseling patient regarding situational stress and fatigue, medical decision making regarding insomnia and further evaluation of elevated parathyroid hormone level is 40 minutes.

## 2021-11-28 NOTE — Telephone Encounter (Signed)
scheduled

## 2021-11-28 NOTE — Telephone Encounter (Signed)
Victoria [name] [phone]  Demaris for the last couple of weeks is having dizzy spells, she feels like she is falling, they first started when she was walking at the park, but now she is even having some when driving. She is off today and would like to come in. She seen pulmonologist and they said if she keep having them to follow up with PCP.

## 2021-11-28 NOTE — Progress Notes (Unsigned)
Chief Complaint:   OBESITY Victoria [name] (MR# [id number]) is a 61 y.o. female who presents for evaluation and treatment of obesity and related comorbidities. Current BMI is Body mass index is 44.39 kg/m. Victoria [name] has been struggling with her weight for many years and has been unsuccessful in either losing weight, maintaining weight loss, or reaching her healthy weight goal.  Victoria [name] is a former patient, establish care. 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  She has had progressive weight gain.  She acknowledges difficulty with appetite and nighttime eating, and frequent snacking.  She is not following a nutritional plan, and she is not taking bariatric vitamins.  She has a history of iron, B12, and vitamin D deficiency.  Victoria [name] is currently in the action stage of change and ready to dedicate time achieving and maintaining a healthier weight. Victoria [name] is interested in becoming our patient and working on intensive lifestyle modifications including (but not limited to) diet and exercise for weight loss.  Victoria [name]'s habits were reviewed today and are as follows: Her family eats meals together, she struggles with family and or coworkers weight loss sabotage, her desired weight loss is 95 lbs, she has been heavy most of her life, she started gaining weight after having her twins, her heaviest weight ever was 292 pounds, she is a picky eater and doesn't like to eat healthier foods, she has significant food cravings issues, she snacks frequently in the evenings, she wakes up frequently in the middle of the night to eat, she skips meals frequently, she is frequently drinking liquids with calories, she frequently makes poor food choices, she has problems with excessive hunger, she frequently eats larger portions than normal, she has binge eating behaviors, and she struggles with emotional eating.  Depression Screen Victoria [name] Food and Mood (modified PHQ-9) score  was 19.     11/20/2021    7:35 AM  Depression screen PHQ 2/9  Decreased Interest 3  Down, Depressed, Hopeless 2  PHQ - 2 Score 5  Altered sleeping 2  Tired, decreased energy 3  Change in appetite 3  Feeling bad or failure about yourself  2  Trouble concentrating 2  Moving slowly or fidgety/restless 2  Suicidal thoughts 0  PHQ-9 Score 19  Difficult doing work/chores Very difficult   Subjective:   1. Other fatigue Roe admits to daytime somnolence and admits to waking up still tired. Patient has a history of symptoms of daytime fatigue, morning fatigue, and morning headache. Victoria [name] generally gets 4 hours of sleep per night, and states that she has nightime awakenings. Snoring is present. Apneic episodes are present. Epworth Sleepiness Score is 4.   2. SOBOE (shortness of breath on exertion) Victoria [name] notes increasing shortness of breath with exercising and seems to be worsening over time with weight gain. She notes getting out of breath sooner with activity than she used to. This has not gotten worse recently. [place] denies shortness of breath at rest or orthopnea.  3. Vitamin D deficiency Victoria [name] has a history of vitamin D deficiency due to adiposity.  Asymptomatic.  History of RYGB.  4. 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  Followed by hematology oncology.  I discussed labs with the patient today.  5. 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  Reviewed pathophysiology.  6. 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  Not on bariatric vitamins.  Post bypass  weight gain, multifactorial.  May need to see surgeon.  Assessment/Plan:   1. Other fatigue Victoria [name] does feel that her weight is causing her energy to be lower than it should be. Fatigue may be related to obesity, depression or many other causes. Labs will be ordered, and in the meanwhile, Victoria [name] will focus on self care including making healthy  food choices, increasing physical activity and focusing on stress reduction.  - CMP14+EGFR - Hemoglobin A1c - Insulin, random - Magnesium - Parathyroid hormone, intact (no Ca) - Copper, serum - Zinc - Vitamin K1, Serum - Vitamin E - Vitamin A - Fe+TIBC+Fer  2. SOBOE (shortness of breath on exertion) Victoria [name] does feel that she gets out of breath more easily that she used to when she exercises. Victoria [name]'s shortness of breath appears to be obesity related and exercise induced. She has agreed to work on weight loss and gradually increase exercise to treat her exercise induced shortness of breath. Will continue to monitor closely.  3. Vitamin D deficiency We will check labs today and intact PTH.  - VITAMIN D 25 Hydroxy (Vit-D Deficiency, Fractures)  4. Iron deficiency We will check labs today.  Update endoscopic history if not completed in the past.  - Folate RBC - Vitamin B12  5. 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  6. 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 Victoria [name] will start prenatal vitamins and take 1 twice a day with food.  Begin her reduced calorie meal plan.  - CMP14+EGFR - Folate RBC - Hemoglobin A1c - Insulin, random - Magnesium - Vitamin B12 - VITAMIN D 25 Hydroxy (Vit-D Deficiency, Fractures) - Parathyroid hormone, intact (no Ca) - Copper, serum - Zinc - Vitamin K1, Serum - Vitamin E - Vitamin A - Prenatal Vit-Fe Fumarate-FA (PRENATAL MULTIVITAMIN) TABS tablet; Take 1 tablet by mouth 2 (two) times daily with a meal.  7. Depression screening Victoria [name] had a positive depression screening. Depression is commonly associated with obesity and often results in emotional eating behaviors. We will monitor this closely and work on CBT to help improve the non-hunger eating patterns. Referral to Psychology may be required if no improvement is  seen as she continues in our clinic.  8. Class 3 severe obesity with serious comorbidity and body mass index (BMI) of 40.0 to 44.9 in adult, unspecified obesity type (HCC) Victoria [name] is currently in the action stage of change and her goal is to continue with weight loss efforts. I recommend Victoria [name] begin the structured treatment plan as follows:  She has agreed to the Category 2 Plan.  Exercise goals: As is, 2 times a week of walking for 30 minutes.    Behavioral modification strategies: increasing lean protein intake, decreasing simple carbohydrates, increasing water intake, no skipping meals, keeping healthy foods in the home, and avoiding temptations.  She was informed of the importance of frequent follow-up visits to maximize her success with intensive lifestyle modifications for her multiple health conditions. She was informed we would discuss her lab results at her next visit unless there is a critical issue that needs to be addressed sooner. Victoria [name] agreed to keep her next visit at the agreed upon time to discuss these results.  Objective:   Blood pressure 100/64, pulse (!) 55, temperature (!) 97.5 F (36.4 C), height 5' 6"  (1.676 m), weight 275 lb (124.7 kg), SpO2 99 %. Body mass index is 44.39 kg/m.  EKG: Normal sinus rhythm, rate 54 BPM.  Indirect Calorimeter completed today shows a VO2 of 206 and a REE of 1426.  Her calculated basal metabolic rate is 2122 thus her basal metabolic rate is worse than expected.  General: Cooperative, alert, well developed, in no acute distress. HEENT: Conjunctivae and lids unremarkable. Cardiovascular: Regular rhythm.  Lungs: Normal work of breathing. Neurologic: No focal deficits.   Lab Results  Component Value Date   CREATININE 0.95 11/20/2021   BUN 14 11/20/2021   NA 141 11/20/2021   K 4.1 11/20/2021   CL 104 11/20/2021   CO2 22 11/20/2021   Lab Results  Component Value Date   ALT 16 11/20/2021   AST 15 11/20/2021   ALKPHOS 57  11/20/2021   BILITOT 0.2 11/20/2021   Lab Results  Component Value Date   HGBA1C 5.7 (H) 11/20/2021   HGBA1C 5.4 06/30/2020   HGBA1C 5.7 (H) 05/16/2020   HGBA1C 5.5 03/15/2017   HGBA1C 5.7 (H) 09/09/2015   Lab Results  Component Value Date   INSULIN 10.0 11/20/2021   INSULIN 6.2 07/07/2020   INSULIN 4 09/16/2015   Lab Results  Component Value Date   TSH 1.91 06/29/2021   Lab Results  Component Value Date   CHOL 220 (H) 06/29/2021   HDL 89 06/29/2021   LDLCALC 113 (H) 06/29/2021   TRIG 79 06/29/2021   CHOLHDL 2.5 06/29/2021   Lab Results  Component Value Date   WBC 4.4 08/01/2021   HGB 13.6 08/01/2021   HCT 42.7 11/20/2021   MCV 99.0 08/01/2021   PLT 217 08/01/2021   Lab Results  Component Value Date   IRON 81 07/12/2021   TIBC 374 07/12/2021   FERRITIN 13 07/12/2021   Attestation Statements:   Reviewed by clinician on day of visit: allergies, medications, problem list, medical history, surgical history, family history, social history, and previous encounter notes.  Time spent on visit including pre-visit chart review and post-visit charting and care was 40 minutes.   I, Trixie Dredge, am acting as transcriptionist for Thomes Dinning, MD.  I have reviewed the above documentation for accuracy and completeness, and I agree with the above. Thomes Dinning, MD

## 2021-11-29 ENCOUNTER — Other Ambulatory Visit (HOSPITAL_COMMUNITY): Payer: Self-pay | Admitting: Orthopedic Surgery

## 2021-11-29 DIAGNOSIS — M542 Cervicalgia: Secondary | ICD-10-CM

## 2021-11-29 DIAGNOSIS — M545 Low back pain, unspecified: Secondary | ICD-10-CM

## 2021-12-03 ENCOUNTER — Ambulatory Visit
Admission: RE | Admit: 2021-12-03 | Discharge: 2021-12-03 | Disposition: A | Discharge status: Home / Self Care | Payer: No Typology Code available for payment source | Source: Ambulatory Visit | Attending: Orthopedic Surgery | Admitting: Orthopedic Surgery

## 2021-12-03 DIAGNOSIS — M542 Cervicalgia: Secondary | ICD-10-CM | POA: Insufficient documentation

## 2021-12-03 DIAGNOSIS — M545 Low back pain, unspecified: Secondary | ICD-10-CM

## 2021-12-04 ENCOUNTER — Encounter (INDEPENDENT_AMBULATORY_CARE_PROVIDER_SITE_OTHER): Payer: Self-pay | Admitting: Internal Medicine

## 2021-12-04 ENCOUNTER — Telehealth: Payer: Self-pay | Admitting: *Deleted

## 2021-12-04 ENCOUNTER — Other Ambulatory Visit (HOSPITAL_COMMUNITY): Payer: Self-pay

## 2021-12-04 ENCOUNTER — Encounter: Payer: Self-pay | Admitting: *Deleted

## 2021-12-04 ENCOUNTER — Other Ambulatory Visit: Payer: No Typology Code available for payment source

## 2021-12-04 ENCOUNTER — Ambulatory Visit (INDEPENDENT_AMBULATORY_CARE_PROVIDER_SITE_OTHER): Payer: No Typology Code available for payment source | Admitting: Internal Medicine

## 2021-12-04 VITALS — BP 134/84 | HR 58 | Temp 97.8°F | Ht 66.0 in | Wt 269.8 lb

## 2021-12-04 DIAGNOSIS — E161 Other hypoglycemia: Secondary | ICD-10-CM

## 2021-12-04 DIAGNOSIS — N2581 Secondary hyperparathyroidism of renal origin: Secondary | ICD-10-CM | POA: Diagnosis not present

## 2021-12-04 DIAGNOSIS — E669 Obesity, unspecified: Secondary | ICD-10-CM

## 2021-12-04 DIAGNOSIS — Z72821 Inadequate sleep hygiene: Secondary | ICD-10-CM

## 2021-12-04 DIAGNOSIS — E559 Vitamin D deficiency, unspecified: Secondary | ICD-10-CM | POA: Diagnosis not present

## 2021-12-04 DIAGNOSIS — Z9884 Bariatric surgery status: Secondary | ICD-10-CM

## 2021-12-04 DIAGNOSIS — R638 Other symptoms and signs concerning food and fluid intake: Secondary | ICD-10-CM

## 2021-12-04 DIAGNOSIS — Z6841 Body Mass Index (BMI) 40.0 and over, adult: Secondary | ICD-10-CM

## 2021-12-04 MED ORDER — ERGOCALCIFEROL 1.25 MG (50000 UT) PO CAPS
ORAL_CAPSULE | ORAL | 1 refills | Status: DC
  Filled 2021-12-04: qty 8, 28d supply, fill #0

## 2021-12-04 NOTE — Telephone Encounter (Signed)
Letter has been sent to patient informing them that their sleep study has expired. Patient will need to call and schedule an office visit to re-evaluate the need for a sleep study.    

## 2021-12-05 ENCOUNTER — Other Ambulatory Visit (HOSPITAL_COMMUNITY): Payer: Self-pay

## 2021-12-05 DIAGNOSIS — E66813 Obesity, class 3: Secondary | ICD-10-CM | POA: Insufficient documentation

## 2021-12-05 DIAGNOSIS — N2581 Secondary hyperparathyroidism of renal origin: Secondary | ICD-10-CM | POA: Insufficient documentation

## 2021-12-05 DIAGNOSIS — Z9884 Bariatric surgery status: Secondary | ICD-10-CM | POA: Insufficient documentation

## 2021-12-05 DIAGNOSIS — R638 Other symptoms and signs concerning food and fluid intake: Secondary | ICD-10-CM | POA: Insufficient documentation

## 2021-12-05 DIAGNOSIS — E161 Other hypoglycemia: Secondary | ICD-10-CM | POA: Insufficient documentation

## 2021-12-05 DIAGNOSIS — Z72821 Inadequate sleep hygiene: Secondary | ICD-10-CM | POA: Insufficient documentation

## 2021-12-07 ENCOUNTER — Other Ambulatory Visit: Payer: No Typology Code available for payment source

## 2021-12-07 ENCOUNTER — Ambulatory Visit: Payer: No Typology Code available for payment source | Attending: Internal Medicine | Admitting: Pharmacist

## 2021-12-07 DIAGNOSIS — E890 Postprocedural hypothyroidism: Secondary | ICD-10-CM

## 2021-12-07 DIAGNOSIS — Z79899 Other long term (current) drug therapy: Secondary | ICD-10-CM

## 2021-12-07 DIAGNOSIS — N2581 Secondary hyperparathyroidism of renal origin: Secondary | ICD-10-CM

## 2021-12-07 NOTE — Progress Notes (Signed)
   S: Patient presents for review of their specialty medication therapy.  Patient is currently taking Humira for hidradenitis suppurativa. Patient is managed by Lennie Odor [facility]) for this.   Adherence: confirms  Efficacy: reports good control with Humira. Will have occasional breakouts and will be on antibiotics but tells these happen rarely.  Dosing:  Maintenance: 40 mg every week   Screening: TB test: completed per patient Hepatitis: completed per patient  Monitoring: S/sx of infection: denies CBC: see below. Monitored by heme/onc S/sx of hypersensitivity: denies  S/sx of malignancy: denies S/sx of heart failure: denies  O:   Lab Results  Component Value Date   WBC 4.4 08/01/2021   HGB 13.6 08/01/2021   HCT 42.7 11/20/2021   MCV 99.0 08/01/2021   PLT 217 08/01/2021      Chemistry      Component Value Date/Time   NA 141 11/20/2021 1104   K 4.1 11/20/2021 1104   CL 104 11/20/2021 1104   CO2 22 11/20/2021 1104   BUN 14 11/20/2021 1104   CREATININE 0.95 11/20/2021 1104   CREATININE 0.94 06/29/2021 1105      Component Value Date/Time   CALCIUM 8.6 (L) 11/20/2021 1104   ALKPHOS 57 11/20/2021 1104   AST 15 11/20/2021 1104   AST 26 10/16/2018 0903   ALT 16 11/20/2021 1104   ALT 26 10/16/2018 0903   BILITOT 0.2 11/20/2021 1104   BILITOT 0.5 10/16/2018 0903      A/P: 1. Medication review: Patient currently on Humira for hidradenitis suppurativa. Reviewed the medication with the patient, including the following: Humira is a TNF blocking agent indicated for ankylosing spondylitis, Crohn's disease, Hidradenitis suppurativa, psoriatic arthritis, plaque psoriasis, ulcerative colitis, and uveitis. Patient educated on purpose, proper use and potential adverse effects of Humira. The most common adverse effects are infections, headache, and injection site reactions. There is the possibility of an increased risk of malignancy but it is not well understood if  this increased risk is due to there medication or the disease state. There are rare cases of pancytopenia and aplastic anemia. No recommendations for any changes at this time.   Benard Halsted, PharmD, [facility], [place] [phone]

## 2021-12-08 ENCOUNTER — Telehealth: Payer: Self-pay | Admitting: Internal Medicine

## 2021-12-08 ENCOUNTER — Encounter: Payer: Self-pay | Admitting: Internal Medicine

## 2021-12-08 ENCOUNTER — Other Ambulatory Visit: Payer: Self-pay

## 2021-12-08 DIAGNOSIS — R7989 Other specified abnormal findings of blood chemistry: Secondary | ICD-10-CM

## 2021-12-08 LAB — PARATHYROID HORMONE, INTACT (NO CA): PTH: 157 pg/mL — ABNORMAL HIGH (ref 16–77)

## 2021-12-08 NOTE — Telephone Encounter (Signed)
Phone call to patient. She has an elevated serum calcium and an elevated PTH. We will see if insurance will approve parathyroid nuclear medicine scan

## 2021-12-10 NOTE — Patient Instructions (Addendum)
She will follow-up with Dr. Lynann Bologna regarding musculoskeletal pain with MRI studies.  Her serum parathyroid hormone level is elevated and will be repeated.  If she has hyperparathyroidism this could be causing some of her symptoms.  Lorazepam 2 mg at bedtime ordered for sleep.

## 2021-12-11 ENCOUNTER — Encounter: Payer: Self-pay | Admitting: Family

## 2021-12-11 ENCOUNTER — Other Ambulatory Visit (HOSPITAL_COMMUNITY): Payer: Self-pay

## 2021-12-12 NOTE — Progress Notes (Signed)
Chief Complaint:   OBESITY Victoria [name] is here to discuss her progress with her obesity treatment plan along with follow-up of her obesity related diagnoses. Victoria [name] is on the Category 2 Plan and states she is following her eating plan approximately 25% of the time. Victoria [name] states she is walking for 30-35 minutes 2 times per week.  Today's visit was #: 2 Starting weight: 275 lbs Starting date: 11/20/2021 Today's weight: 269 lbs Today's date: 12/04/2021 Total lbs lost to date: 6 Total lbs lost since last in-office visit: 6  Interim History: Victoria [name] has problems with the amount of food at lunch. She feels full and uncomfortable. She has been drinking protein shakes for breakfast due to convenience. She then later eats a yogurt. She did not tolerate prenatal due to constipation. She drinks only 16 oz of water. She has problems with falling asleep and staying sleep.   Subjective:   1. Other hypoglycemia-post bariatric Improved with no lows recently.   2. Vitamin D deficiency Victoria [name]'s Vit D level was 29.4. She is on high dose Vitamin D. Has low calcium and increased PTH. I discussed labs with the patient today.   3. Secondary hyperparathyroidism (Victoria [name]) Victoria [name]'s diagnosis is due to Vitamin D deficiency. I discussed labs with the patient today.   4. History of Roux-en-Y gastric bypass Reviewed test results with the patient and micronutrient status. She needs Vitamin D. All other labs were in acceptable ranges. I discussed labs with the patient today.   5. Inadequate fluid intake Victoria [name] is drinking 16 oz of water per day, but not enough. This is likely causing constipation.  6. 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 She may have psychophysiologic cause as well. This may contribute to weight gain.    Assessment/Plan:   1. Other hypoglycemia-post bariatric [place] is to continue her low carb meals with emphasis on complex carbs.  This  has improved her post bariatric hypoglycemia  2. Vitamin D deficiency Patsie agreed to increase egro to 50,000 IU twice a week for a goal level of 50-60. We will recheck her level in 8 weeks.   - ergocalciferol (VITAMIN D2) 1.25 MG (50000 UT) capsule; TAKE 1 CAPSULE BY MOUTH TWICE A WEEK FOR VITAMIN DEFICIENCY AND GASTRIC BYPASS  Dispense: 8 capsule; Refill: 1  3. Secondary hyperparathyroidism ([place]) Replenish Vitamin D, check PTH in 3-4 months.   4. History of Roux-en-Y gastric bypass Victoria [name] will decrease prenatal vitamins to once a day due to constipation, and increase Vitamin D to twice weekly.  I reviewed vitamin levels with patient today and all with the exception of vitamin D were at adequate levels.  5. Inadequate fluid intake Victoria [name] is to increase her water intake to >72 oz a day.   6. Inadequate sleep hygiene Victoria [name] was advised to review sleep hygiene and discuss with her PCP.   7. Obesity with current BMI of 43.5 Victoria [name] is currently in the action stage of change. As such, her goal is to continue with weight loss efforts. She has agreed to the Category 2 Plan. 2 oz substitution to reduce protein at lunch.    Portion lunch into two with 2 oz serving options. May use meal replacement and healthy snack for convenience in the morning.   Exercise goals: As is, resistance training on Youtube.   Behavioral modification strategies: increasing lean protein intake, increasing water intake, no skipping meals, and meal planning and cooking strategies.  Victoria [name] has  agreed to follow-up with our clinic in 2 weeks. She was informed of the importance of frequent follow-up visits to maximize her success with intensive lifestyle modifications for her multiple health conditions.   Objective:   Blood pressure 134/84, pulse (!) 58, temperature 97.8 F (36.6 C), height '5\' 6"'$  (1.676 m), weight 269 lb 12.8 oz (122.4 kg), SpO2 100 %. Body mass index is 43.55 kg/m.  General: Cooperative, alert, well  developed, in no acute distress. HEENT: Conjunctivae and lids unremarkable. Cardiovascular: Regular rhythm.  Lungs: Normal work of breathing. Neurologic: No focal deficits.   Lab Results  Component Value Date   CREATININE 0.95 11/20/2021   BUN 14 11/20/2021   NA 141 11/20/2021   K 4.1 11/20/2021   CL 104 11/20/2021   CO2 22 11/20/2021   Lab Results  Component Value Date   ALT 16 11/20/2021   AST 15 11/20/2021   ALKPHOS 57 11/20/2021   BILITOT 0.2 11/20/2021   Lab Results  Component Value Date   HGBA1C 5.7 (H) 11/20/2021   HGBA1C 5.4 06/30/2020   HGBA1C 5.7 (H) 05/16/2020   HGBA1C 5.5 03/15/2017   HGBA1C 5.7 (H) 09/09/2015   Lab Results  Component Value Date   INSULIN 10.0 11/20/2021   INSULIN 6.2 07/07/2020   INSULIN 4 09/16/2015   Lab Results  Component Value Date   TSH 1.91 06/29/2021   Lab Results  Component Value Date   CHOL 220 (H) 06/29/2021   HDL 89 06/29/2021   LDLCALC 113 (H) 06/29/2021   TRIG 79 06/29/2021   CHOLHDL 2.5 06/29/2021   Lab Results  Component Value Date   VD25OH 29.4 (L) 11/20/2021   VD25OH 18.9 (L) 07/07/2020   VD25OH 41 10/02/2018   Lab Results  Component Value Date   WBC 4.4 08/01/2021   HGB 13.6 08/01/2021   HCT 42.7 11/20/2021   MCV 99.0 08/01/2021   PLT 217 08/01/2021   Lab Results  Component Value Date   IRON 81 07/12/2021   TIBC 374 07/12/2021   FERRITIN 13 07/12/2021   Attestation Statements:   Reviewed by clinician on day of visit: allergies, medications, problem list, medical history, surgical history, family history, social history, and previous encounter notes.  Time spent on visit including pre-visit chart review and post-visit care and charting was 40 minutes.   Wilhemena Durie, am acting as transcriptionist for Thomes Dinning, MD.  I have reviewed the above documentation for accuracy and completeness, and I agree with the above. -Thomes Dinning, MD

## 2021-12-13 ENCOUNTER — Ambulatory Visit (HOSPITAL_COMMUNITY): Payer: No Typology Code available for payment source

## 2021-12-14 ENCOUNTER — Encounter (HOSPITAL_COMMUNITY)
Admission: RE | Admit: 2021-12-14 | Discharge: 2021-12-14 | Disposition: A | Discharge status: Home / Self Care | Payer: No Typology Code available for payment source | Source: Ambulatory Visit | Attending: Internal Medicine | Admitting: Internal Medicine

## 2021-12-14 ENCOUNTER — Other Ambulatory Visit (HOSPITAL_COMMUNITY): Payer: Self-pay

## 2021-12-14 ENCOUNTER — Other Ambulatory Visit: Payer: Self-pay | Admitting: Internal Medicine

## 2021-12-14 DIAGNOSIS — R7989 Other specified abnormal findings of blood chemistry: Secondary | ICD-10-CM | POA: Insufficient documentation

## 2021-12-14 MED ORDER — TECHNETIUM TC 99M SESTAMIBI - CARDIOLITE
25.4000 | Freq: Once | INTRAVENOUS | Status: AC | PRN
  Administered 2021-12-14: 25.4 via INTRAVENOUS

## 2021-12-14 MED ORDER — LEVOTHYROXINE SODIUM 112 MCG PO TABS
112.0000 ug | ORAL_TABLET | Freq: Every day | ORAL | 1 refills | Status: DC
  Filled 2021-12-14: qty 90, 90d supply, fill #0
  Filled 2022-03-20: qty 90, 90d supply, fill #1

## 2021-12-15 ENCOUNTER — Other Ambulatory Visit (HOSPITAL_COMMUNITY): Payer: Self-pay

## 2021-12-15 ENCOUNTER — Other Ambulatory Visit: Payer: Self-pay | Admitting: Neurology

## 2021-12-15 DIAGNOSIS — G43009 Migraine without aura, not intractable, without status migrainosus: Secondary | ICD-10-CM

## 2021-12-18 ENCOUNTER — Other Ambulatory Visit (HOSPITAL_COMMUNITY): Payer: Self-pay

## 2021-12-18 ENCOUNTER — Ambulatory Visit (INDEPENDENT_AMBULATORY_CARE_PROVIDER_SITE_OTHER): Payer: No Typology Code available for payment source | Admitting: Internal Medicine

## 2021-12-18 ENCOUNTER — Encounter (INDEPENDENT_AMBULATORY_CARE_PROVIDER_SITE_OTHER): Payer: Self-pay | Admitting: Internal Medicine

## 2021-12-18 VITALS — BP 126/82 | HR 62 | Temp 98.2°F | Ht 66.0 in | Wt 271.4 lb

## 2021-12-18 DIAGNOSIS — R4589 Other symptoms and signs involving emotional state: Secondary | ICD-10-CM

## 2021-12-18 DIAGNOSIS — E669 Obesity, unspecified: Secondary | ICD-10-CM

## 2021-12-18 DIAGNOSIS — Z6841 Body Mass Index (BMI) 40.0 and over, adult: Secondary | ICD-10-CM

## 2021-12-18 DIAGNOSIS — E559 Vitamin D deficiency, unspecified: Secondary | ICD-10-CM

## 2021-12-18 DIAGNOSIS — G4733 Obstructive sleep apnea (adult) (pediatric): Secondary | ICD-10-CM

## 2021-12-18 NOTE — Telephone Encounter (Signed)
Patient called back and said she called [facility] to see if she could be seen sooner and they said they couldn't. They said if she needs to be seen sooner she might need to be referred elsewhere. Patient wanted to know what Dr Renold Genta wants her to do

## 2021-12-19 DIAGNOSIS — R4589 Other symptoms and signs involving emotional state: Secondary | ICD-10-CM | POA: Insufficient documentation

## 2021-12-19 DIAGNOSIS — G4733 Obstructive sleep apnea (adult) (pediatric): Secondary | ICD-10-CM | POA: Insufficient documentation

## 2021-12-20 ENCOUNTER — Encounter (HOSPITAL_COMMUNITY): Payer: Self-pay | Admitting: Pharmacist

## 2021-12-20 ENCOUNTER — Other Ambulatory Visit (HOSPITAL_COMMUNITY): Payer: Self-pay

## 2021-12-21 ENCOUNTER — Other Ambulatory Visit (HOSPITAL_COMMUNITY): Payer: Self-pay

## 2021-12-26 ENCOUNTER — Ambulatory Visit (INDEPENDENT_AMBULATORY_CARE_PROVIDER_SITE_OTHER): Payer: No Typology Code available for payment source | Admitting: Neurology

## 2021-12-26 ENCOUNTER — Encounter: Payer: Self-pay | Admitting: Neurology

## 2021-12-26 VITALS — Ht 66.0 in | Wt 272.6 lb

## 2021-12-26 DIAGNOSIS — G43709 Chronic migraine without aura, not intractable, without status migrainosus: Secondary | ICD-10-CM | POA: Insufficient documentation

## 2021-12-26 DIAGNOSIS — E860 Dehydration: Secondary | ICD-10-CM | POA: Diagnosis not present

## 2021-12-26 DIAGNOSIS — G43009 Migraine without aura, not intractable, without status migrainosus: Secondary | ICD-10-CM | POA: Diagnosis not present

## 2021-12-26 MED ORDER — QULIPTA 60 MG PO TABS: 60.0000 mg | ORAL_TABLET | Freq: Every day | ORAL | 11 refills | Status: DC

## 2021-12-26 MED ORDER — UBRELVY 100 MG PO TABS: 100.0000 mg | ORAL_TABLET | ORAL | 11 refills | Status: DC | PRN

## 2021-12-26 NOTE — Progress Notes (Unsigned)
[facility]    Provider:  Dr Jaynee Eagles Requesting Provider: Renold Genta Cresenciano Lick, MD Primary Care Provider:  Elby Showers, MD  CC:  migraines, dizziness  12/26/2021: Patient with severe low back pain and radicular symptoms with concerning symptoms, weakness of the lower extremities, absent reflexes, MRI lumbar spine was essential normal as was the emg/ncs. . We have seen her in the past for migraines and also numbness/tingling in the feet. EMG/NCS normal 05/2020 of upper and lower. Today she is here for refill on her nurtec. Dizzy especially when walking. She has migraines about 6 migraine days a month and 10 total headache days a month. Nurtec doesn't help as much as it used to. She has been etting lightheaded with exercise more lightheadedness. Feeling like she may pass out. She doesn't drink enough fluids in a day. She has not been feeling well, more stress at work, not drinking enough fluids.   6 migraine days a month. < 10 total headache days a month. Failed: gabapentin, labetalol, nurtec, Ajovy, metoprolol, imitrex, maxalt, amitriptyline, topamax  Patient complains of symptoms per HPI as well as the following symptoms: dizziness . Pertinent negatives and positives per HPI. All others negative   HPI 06/04/2019:  Titianna Loomis is a 61 y.o. female here as requested by Elby Showers, MD for numbness in the feet. PMHx migraines, chiari malformation, vitamin B12 and vitamin D deficiency, post gastrectomy syndrome, pernicious anemia, osteoarthritis, hypothyroidism, C. difficile, palpitations, headache, degenerative disease of the low back, chronic leukopenia.  She has back pain and it radiates down the left leg. She has had injections in her back, she has been under the care of him for over a year, she has tried all sorts of conservative measures, se has been to PT. She had a hip revision and knee problems but this is not coming from hip or knee and xray of the knee is fine,  Starts in the low back raves behind the leg and down to the toes on the left. Now the right is giving her trouble and radiating to her buttocks and down the leg. She is having weakness in the legs. She can't exercise. She is having urinary changes,.pain is severe, shooting are severe, waking her up at night, she is trying analgesics, muscle relaxants, gabapentin. No other focal neurologic deficits, associated symptoms, inciting events or modifiable factors.  Reviewed notes, labs and imaging from outside physicians, which showed:  Personally reviewed images MRI cervical spine and agree with the following 2020: MRI cervical spine (without) demonstrating: - Mild degenerative changes from C3-4 down to C6-7. - No spinal stenosis or foraminal narrowing  MRI brain was unremarkable  B12 398 B1 9 Folate nml  Reviewed MRI report 03/06/2006 L1-2:  Mild facet overgrowth.  No central or foraminal stenosis. L2-3:  Mild bilateral facet overgrowth.  No significant stenosis.   L3-4:  Minimal facet overgrowth. L4-5:  Mild facet overgrowth is present.  A small synovial cyst is present posterior to the left facet.  This is not in a position to cause nerve impingement.   L5-S1:  Left excentric disc bulge noted.  Facet overgrowth noted.  There is mild left foraminal stenosis. IMPRESSION:   1.  Mild left foraminal stenosis at L5-S1, predominantly due to facet overgrowth as shown on image # 10 of series 4.   Review of Systems: Patient complains of symptoms per HPI as well as the following symptoms: severe leg pain, low back pain, weakness. Pertinent negatives and  positives per HPI. All others negative.   Social History   Socioeconomic History   Marital status: Married    Spouse name: Barbaraann Rondo   Number of children: 2   Years of education: Not on file   Highest education level: Not on file  Occupational History   Occupation: Therapist, sports  Tobacco Use   Smoking status: Never   Smokeless tobacco: Never  Vaping Use    Vaping Use: Never used  Substance and Sexual Activity   Alcohol use: No   Drug use: No   Sexual activity: Not Currently    Partners: Male    Birth control/protection: Surgical    Comment: hysterectomy  Other Topics Concern   Not on file  Social History Narrative   Lives at home with spouse   Right handed   Caffeine: diet zero mtn dew, occasionally    Social Determinants of Health   Financial Resource Strain: Not on file  Food Insecurity: Not on file  Transportation Needs: Not on file  Physical Activity: Not on file  Stress: Not on file  Social Connections: Not on file  Intimate Partner Violence: Not on file    Family History  Problem Relation Age of Onset   High blood pressure Mother    Hypertension Mother    Headache Mother    High Cholesterol Mother    Thyroid disease Mother    Obesity Mother    Kidney disease Mother    Diabetes Father    High blood pressure Father    High Cholesterol Father    Heart disease Father    Obesity Father    Diabetes Sister    Hypertension Sister    Migraines Neg Hx     Past Medical History:  Diagnosis Date   Back pain    Bursitis    right shoulder   Chronic leukopenia    intermittant since 2010, followed by Dr. Renold Genta now   Chronic neck pain    Constipation    Degenerative joint disease of low back 09/02/2015   Dr. Maureen Ralphs   Displacement of lumbar intervertebral disc    Edema of both lower legs    Fibrocystic breast    Gallbladder problem    GERD (gastroesophageal reflux disease)    Headache    per pt more stress/tension   Heart palpitations    SEE EPIC ENCOUTNER , CARDIOLOGY DR. Tressia Miners TURNER 2018; reports on 05-16-17 " i haven't had any bouts of those lately"     Hemorrhoids    Hidradenitis suppurativa    History of Clostridium difficile infection 12/2010   History of exercise intolerance    ETT on 04-27-2016-- negative ([facility] treadmill score 7)   History of Helicobacter pylori infection 2001 and 09/ 2012   HSV-2  infection    genital   Hx of adenomatous colonic polyps 10/17/2007   Hydradenitis    per pt currently treated with Humira injection   Hypocupremia    Hypothyroidism    Hypothyroidism, postsurgical 1980   IBS (irritable bowel syndrome)    Insomnia    Iron deficiency    Joint pain    Leukopenia    Lumbosacral radiculopathy    Medial meniscus tear    right knee   Mild obstructive sleep apnea    Mitral regurgitation    Mild to moderate by echo 09/2021   Numbness and tingling of right arm    Obesity    Osteoarthritis    Palpitations    Pernicious anemia  b12 def   Peroneal neuropathy    PONV (postoperative nausea and vomiting)    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   Prediabetes    Reactive hypoglycemia followed by pcp   post gastrectomy dumping syndrome   SOB (shortness of breath)    Spinal stenosis    Tendonitis    right shoulder   Varicose veins    Vertigo    Vitamin B 12 deficiency    Vitamin D deficiency     Patient Active Problem List   Diagnosis Date Noted   Migraine without aura and without status migrainosus, not intractable 12/26/2021   Depressed mood 12/19/2021   Obstructive sleep apnea 12/19/2021   Other hypoglycemia-post bariatric 12/05/2021   Secondary hyperparathyroidism ([place]) 12/05/2021   Inadequate fluid intake 12/05/2021   Inadequate sleep hygiene 12/05/2021   Class 3 severe obesity with body mass index (BMI) of 40.0 to 44.9 in adult ([place]) 12/05/2021   Hx of gastric bypass 12/05/2021   SOBOE (shortness of breath on exertion) 11/21/2021   Other fatigue 11/21/2021   Vitamin D deficiency 11/21/2021   Iron deficiency 11/21/2021   Hypoglycemia after bariatric surgery 11/21/2021   Depression screening 11/21/2021   Class 3 severe obesity with serious comorbidity and body mass index (BMI) of 40.0 to 44.9 in adult ([place]) 11/21/2021   Mild obstructive sleep apnea 11/17/2021   Vertigo 11/17/2021   Excessive daytime sleepiness 10/23/2021   Spinal  stenosis of lumbar region 07/07/2020   Unilateral primary osteoarthritis, right knee 06/08/2020   Displacement of lumbar intervertebral disc 06/02/2020   Lumbar spondylosis 06/02/2020   Lumbar stenosis with neurogenic claudication 06/02/2020   Arm numbness 05/30/2020   Chronic neck pain 05/30/2020   Other chronic pain 05/30/2020   Elevated blood-pressure reading, without diagnosis of hypertension 05/30/2020   Status post arthroscopy of right knee 12/11/2019   Polyethylene wear of left knee joint prosthesis ([place]) 09/09/2019   Acute medial meniscal tear, right, subsequent encounter 08/27/2019   History of total left knee replacement 07/16/2019   Peroneal neuropathy at knee, left 06/25/2019   Lumbosacral radiculopathy 06/07/2019   Iron malabsorption 10/16/2018   IDA (iron deficiency anemia) 10/16/2018   OA (osteoarthritis) of knee 01/20/2018   Acute meniscal tear, medial 07/17/2017   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   Failed total hip arthroplasty, 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/2017   Pain in left knee 04/11/2017   Insomnia 11/08/2016   Pernicious anemia 11/08/2016   Leukopenia 07/09/2016   Hypocupremia 07/09/2016   Palpitations 04/12/2016   Shortness of breath 04/12/2016   Reactive hypoglycemia 10/31/2015   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/31/2015   Lap gastric bypass May 2016 07/06/2014   History of Clostridium difficile infection 08/12/2011   Postoperative hypothyroidism 08/26/2010   Gastroesophageal reflux disease 08/26/2010   Vitamin D deficiency 08/26/2010   Fibrocystic disease of breast 08/26/2010   Cobalamin deficiency 08/26/2010   Morbid obesity ([place]) 03/20/2010   Backache 03/20/2010    Past Surgical History:  Procedure Laterality Date   BREATH TEK H PYLORI N/A 05/03/2014   Procedure: BREATH TEK H PYLORI;  Surgeon: Kaylyn Lim, MD;  Location: [facility];  Service: General;  Laterality: N/A;   CARDIOVASCULAR STRESS TEST  03/11/2013   dr Tressia Miners turner   Low risk  nuclear study w/ a very small anterior perfusion defect that most likely represents breast attenuation artifact, less likely this could represent a true perfusion abnormality in the distribution of diagonal branch of the LAD/  normal LV function and wall motion ,  ef 66%   CATARACT EXTRACTION W/ INTRAOCULAR LENS  IMPLANT, BILATERAL  10/2017   CESAREAN SECTION  [place]   COLONOSCOPY  02/2017   Dr [name] ; per patient they found 7 polyps with polypectomy; on 5 year track    ESOPHAGOGASTRODUODENOSCOPY  02/01/2021   FINE NEEDLE ASPIRATION Left 05/22/2017   Procedure: LEFT KNEE ASPIRATION;  Surgeon: Gaynelle Arabian, MD;  Location: [facility];  Service: Orthopedics;  Laterality: Left;   GASTRIC ROUX-EN-Y N/A 07/06/2014   Procedure: LAPAROSCOPIC ROUX-EN-Y GASTRIC BYPASS, ENTEROLYSIS OF ADHESIONS WITH UPPER ENDOSCOPY;  Surgeon: Johnathan Hausen, MD;  Location: [facility];  Service: General;  Laterality: N/A;   HAMMER TOE SURGERY Bilateral 02/17/2008   bilateral foot digit's 2 and 5   HIATAL HERNIA REPAIR N/A 07/06/2014   Procedure: LAPAROSCOPIC REPAIR OF HIATAL HERNIA;  Surgeon: Johnathan Hausen, MD;  Location: [facility];  Service: General;  Laterality: N/A;   I & D KNEE WITH POLY EXCHANGE Left 12/11/2019   Procedure: LEFT KNEE POLY-LINER EXCHANGE;  Surgeon: Mcarthur Rossetti, MD;  Location: [place];  Service: Orthopedics;  Laterality: Left;   KNEE ARTHROSCOPY Right 12/11/2019   Procedure: RIGHT KNEE ARTHROSCOPY WITH PARTIAL MEDIAL MENISCECTOMY;  Surgeon: Mcarthur Rossetti, MD;  Location: [place];  Service: Orthopedics;  Laterality: Right;   KNEE ARTHROSCOPY WITH MEDIAL MENISECTOMY Left 07/17/2017   Procedure: LEFT KNEE ARTHROSCOPY WITH MEDIAL LATERAL MENISECTOMY;  Surgeon: Gaynelle Arabian, MD;  Location: [facility];  Service: Orthopedics;  Laterality: Left;   MASS EXCISION N/A 10/11/2016   Procedure: EXCISION OF PERIANAL MASS;  Surgeon: [name], MD;  Location: [place];  Service: General;  Laterality: N/A;   [place]  08-25-2002   dr Margaretha Glassing   ovaries retained   TOTAL HIP ARTHROPLASTY Right 05/21/2012   Procedure: TOTAL HIP ARTHROPLASTY;  Surgeon: Gearlean Alf, MD;  Location: [facility];  Service: Orthopedics;  Laterality: Right;   TOTAL HIP ARTHROPLASTY Left 01-31-2009  dr Wynelle Link   TOTAL HIP REVISION Left 05/22/2017   Procedure: Left hip bearing surface and head revision;  Surgeon: Gaynelle Arabian, MD;  Location: [facility];  Service: Orthopedics;  Laterality: Left;   TOTAL KNEE ARTHROPLASTY Left 01/20/2018   Procedure: LEFT TOTAL KNEE ARTHROPLASTY;  Surgeon: Gaynelle Arabian, MD;  Location: [facility];  Service: Orthopedics;  Laterality: Left;  56mn   TRANSTHORACIC ECHOCARDIOGRAM  05/03/2016   ef 55-60%/  trivial MR/  mild TR   TUBAL LIGATION      Current Outpatient Medications  Medication Sig Dispense Refill   Adalimumab (HUMIRA PEN) 40 MG/0.8ML PNKT Inject 1 pen subcutaneously every week 4 each 5   albuterol (VENTOLIN HFA) 108 (90 Base) MCG/ACT inhaler Inhale 2 puffs into the lungs every 4 hours as needed for wheezing or shortness of breath. 18 g 3   Atogepant (QULIPTA) 60 MG TABS Take 60 mg by mouth daily. 30 tablet 11   celecoxib (CELEBREX) 200 MG capsule Take 1 capsule (200 mg total) by mouth 2 (two) times daily as needed. 60 capsule 3   clobetasol ointment (TEMOVATE) 0.05 % Apply to skin twice daily as directed for up to 2 weeks as needed 60 g 0   cyanocobalamin (DODEX) 1000 MCG/ML injection INJECT 1 ML INTRAMUSCULARLY EVERY MONTH 3 mL 11   Estradiol (DIVIGEL) 1.25 MG/1.25GM GEL Use one packet daily as directed 112.5 g 3   flecainide (  TAMBOCOR) 50 MG tablet Take 1 tablet by mouth 2 times daily. 180 tablet 3   gabapentin (NEURONTIN) 600 MG tablet Take 1 tablet (600 mg total) by mouth 3 (three) times daily. 90 tablet 5   guaiFENesin (MUCINEX) 600 MG 12 hr tablet Take 1 - 2 tablets by  mouth twice daily as needed for drainage.  Take with lots of water 120 tablet 2   levocetirizine (XYZAL) 5 MG tablet Take 5 mg by mouth every evening.     levothyroxine (SYNTHROID) 112 MCG tablet Take 1 tablet (112 mcg total) by mouth daily. 90 tablet 1   LORazepam (ATIVAN) 2 MG tablet Take 1 tablet (2 mg total) by mouth at bedtime. 30 tablet 2   metoprolol succinate (TOPROL-XL) 25 MG 24 hr tablet TAKE 1 TABLET BY MOUTH DAILY. 90 tablet 3   montelukast (SINGULAIR) 10 MG tablet Take 1 tablet by mouth at bedtime. 30 tablet 3   Olopatadine HCl 0.2 % SOLN Apply 1 drop to eye daily as needed. 2.5 mL 5   pantoprazole (PROTONIX) 40 MG tablet Take 1 tablet by mouth daily 20 minutes before breakfast 90 tablet 3   Prenatal Vit-Fe Fumarate-FA (PRENATAL MULTIVITAMIN) TABS tablet Take 1 tablet by mouth 2 (two) times daily with a meal.     promethazine (PHENERGAN) 25 MG tablet TAKE 1 TABLET BY MOUTH EVERY 8 HOURS AS NEEDED FOR NAUSEA AND/OR VOMITING 30 tablet 5   sulfamethoxazole-trimethoprim (BACTRIM DS) 800-160 MG tablet Take 1 tablet twice daily as needed for flare. 60 tablet 1   terconazole (TERAZOL 7) 0.4 % vaginal cream Place 1 applicatorful vaginally at bedtime for seven nights.   Then use one applicatorful once a week for 6 months. 45 g 4   tiZANidine (ZANAFLEX) 4 MG capsule Take 1 capsule (4 mg total) by mouth 3 (three) times daily as needed for muscle spasms. 90 capsule 1   Ubrogepant (UBRELVY) 100 MG TABS Take 100 mg by mouth every 2 (two) hours as needed. Maximum '200mg'$  a day. 16 tablet 11   Calcium Carb-Cholecalciferol (CALCIUM + D3 PO) Take 1 tablet by mouth daily.     ergocalciferol (VITAMIN D2) 1.25 MG (50000 UT) capsule Take 1 capsule (50,000 Units total) by mouth 2 (two) times a week for Vitamin deficiency and gastric bypass. 8 capsule 1   No current facility-administered medications for this visit.   Facility-Administered Medications Ordered in Other Visits  Medication Dose Route Frequency  Provider Last Rate Last Admin   gadobenate dimeglumine (MULTIHANCE) injection 20 mL  20 mL Intravenous Once PRN Melvenia Beam, MD        Allergies as of 12/26/2021 - Review Complete 12/26/2021  Allergen Reaction Noted   Other Other (See Comments) 02/11/2018   Estradiol Rash and Other (See Comments) 03/12/2016    Vitals: Ht '5\' 6"'$  (1.676 m)   Wt 272 lb 9.6 oz (123.7 kg)   BMI 44.00 kg/m  Last Weight:  Wt Readings from Last 1 Encounters:  12/26/21 272 lb 9.6 oz (123.7 kg)   Last Height:   Ht Readings from Last 1 Encounters:  12/26/21 '5\' 6"'$  (1.676 m)   Exam: NAD, pleasant                  Speech:    Speech is normal; fluent and spontaneous with normal comprehension.  Cognition:    The patient is oriented to person, place, and time;     recent and remote memory intact;  language fluent;    Cranial Nerves:    The pupils are equal, round, and reactive to light.Trigeminal sensation is intact and the muscles of mastication are normal. The face is symmetric. The palate elevates in the midline. Hearing intact. Voice is normal. Shoulder shrug is normal. The tongue has normal motion without fasciculations.   Coordination:  No dysmetria  Motor Observation:    No asymmetry, no atrophy, and no involuntary movements noted. Tone:    Normal muscle tone.     Strength:    Strength is V/V in the upper and lower limbs.      Sensation: intact to LT     Assessment/Plan:  Patient with migraines and dizziness  Not orthostatic: 123/83 55 140/81 50 148/83 55 Migraines: having more stress, more migraines. Nurtec not working as well. Start daily [place] with Roselyn Meier as needed Dizziness: dehydrated and stressed and migraines. Drink more fluids. Today will give a migraine cocktail 57m fluids, depacon, toradol, '250mg'$  steroids : improved   Meds ordered this encounter  Medications   Atogepant (QULIPTA) 60 MG TABS    Sig: Take 60 mg by mouth daily.    Dispense:  30 tablet    Refill:   11    6 migraine days a month. < 10 total headache days a month. Failed: gabapentin, labetalol, nurtec, Ajovy, metoprolol, imitrex, maxalt, amitriptyline, topamax   Ubrogepant (UBRELVY) 100 MG TABS    Sig: Take 100 mg by mouth every 2 (two) hours as needed. Maximum '200mg'$  a day.    Dispense:  16 tablet    Refill:  11    6 migraine days a month. < 10 total headache days a month. Failed: gabapentin, labetalol, nurtec, Ajovy, metoprolol, imitrex, maxalt, amitriptyline, topamax   I spent 40 minutes of face-to-face and non-face-to-face time with patient on the  1. Migraine without aura and without status migrainosus, not intractable   2. Dehydration    diagnosis.  This included previsit chart review, lab review, study review, order entry, electronic health record documentation, patient education on the different diagnostic and therapeutic options, counseling and coordination of care, risks and benefits of management, compliance, or risk factor reduction  Cc: [place], MCresenciano Lick MD,    ASarina Ill MD  [facility] [address]SP[address] ValleyG[place] [address] Phone [phone] [phone]

## 2021-12-26 NOTE — Patient Instructions (Addendum)
Migraines: having more stress, more migraines. Nurtec not working as well. Start daily Qulipta(atogepant) with Roselyn Meier as needed instead of nurtec Dizziness: dehydrated and stressed and migraines. Drink more fluids. Today will give a migraine cocktail 541m fluids, depacon, toradol, '160mg'$  steroids   Ubrogepant Tablets What is this medication? UBROGEPANT (ue BROE je pant) treats migraines. It works by blocking a substance in the body that causes migraines. It is not used to prevent migraines. This medicine may be used for other purposes; ask your health care provider or pharmacist if you have questions. COMMON BRAND NAME(S): URoselyn MeierWhat should I tell my care team before I take this medication? They need to know if you have any of these conditions: Kidney disease Liver disease An unusual or allergic reaction to ubrogepant, other medications, foods, dyes, or preservatives Pregnant or trying to get pregnant Breast-feeding How should I use this medication? Take this medication by mouth with a glass of water. Take it as directed on the prescription label. You can take it with or without food. If it upsets your stomach, take it with food. Keep taking it unless your care team tells you to stop. Talk to your care team about the use of this medication in children. Special care may be needed. Overdosage: If you think you have taken too much of this medicine contact a poison control center or emergency room at once. NOTE: This medicine is only for you. Do not share this medicine with others. What if I miss a dose? This does not apply. This medication is not for regular use. What may interact with this medication? Do not take this medication with any of the following: Adagrasib Ceritinib Certain antibiotics, such as chloramphenicol, clarithromycin, telithromycin Certain antivirals for HIV, such as atazanavir, cobicistat, darunavir, delavirdine, fosamprenavir, indinavir, ritonavir Certain medications  for fungal infections, such as itraconazole, ketoconazole, posaconazole, voriconazole Conivaptan Grapefruit Idelalisib Mifepristone Nefazodone Ribociclib This medication may also interact with the following: Carvedilol Certain medications for seizures, such as phenobarbital, phenytoin Ciprofloxacin Cyclosporine Eltrombopag Fluconazole Fluvoxamine Quinidine Rifampin St. John's wort Verapamil This list may not describe all possible interactions. Give your health care provider a list of all the medicines, herbs, non-prescription drugs, or dietary supplements you use. Also tell them if you smoke, drink alcohol, or use illegal drugs. Some items may interact with your medicine. What should I watch for while using this medication? Visit your care team for regular checks on your progress. Tell your care team if your symptoms do not start to get better or if they get worse. Your mouth may get dry. Chewing sugarless gum or sucking hard candy and drinking plenty of water may help. Contact your care team if the problem does not go away or is severe. What side effects may I notice from receiving this medication? Side effects that you should report to your care team as soon as possible: Allergic reactions--skin rash, itching, hives, swelling of the face, lips, tongue, or throat Side effects that usually do not require medical attention (report to your care team if they continue or are bothersome): Drowsiness Dry mouth Fatigue Nausea This list may not describe all possible side effects. Call your doctor for medical advice about side effects. You may report side effects to FDA at 1-800-FDA-1088. Where should I keep my medication? Keep out of the reach of children and pets. Store between 15 and 30 degrees C (59 and 86 degrees F). Get rid of any unused medication after the expiration date. To get rid of medications  that are no longer needed or have expired: Take the medication to a medication  take-back program. Check with your pharmacy or law enforcement to find a location. If you cannot return the medication, check the label or package insert to see if the medication should be thrown out in the garbage or flushed down the toilet. If you are not sure, ask your care team. If it is safe to put it in the trash, pour the medication out of the container. Mix the medication with cat litter, dirt, coffee grounds, or other unwanted substance. Seal the mixture in a bag or container. Put it in the trash. NOTE: This sheet is a summary. It may not cover all possible information. If you have questions about this medicine, talk to your doctor, pharmacist, or health care provider.  2023 Elsevier/Gold Standard (2021-04-05 00:00:00) Atogepant Tablets What is this medication? ATOGEPANT (a TOE je pant) prevents migraines. It works by blocking a substance in the body that causes migraines. This medicine may be used for other purposes; ask your health care provider or pharmacist if you have questions. COMMON BRAND NAME(S): QULIPTA What should I tell my care team before I take this medication? They need to know if you have any of these conditions: Kidney disease Liver disease An unusual or allergic reaction to atogepant, other medications, foods, dyes, or preservatives Pregnant or trying to get pregnant Breast-feeding How should I use this medication? Take this medication by mouth with water. Take it as directed on the prescription label at the same time every day. You can take it with or without food. If it upsets your stomach, take it with food. Keep taking it unless your care team tells you to stop. Talk to your care team about the use of this medication in children. Special care may be needed. Overdosage: If you think you have taken too much of this medicine contact a poison control center or emergency room at once. NOTE: This medicine is only for you. Do not share this medicine with others. What if  I miss a dose? If you miss a dose, take it as soon as you can. If it is almost time for your next dose, take only that dose. Do not take double or extra doses. What may interact with this medication? Carbamazepine Certain medications for fungal infections, such as itraconazole, ketoconazole Clarithromycin Cyclosporine Efavirenz Etravirine Phenytoin Rifampin St. John's wort This list may not describe all possible interactions. Give your health care provider a list of all the medicines, herbs, non-prescription drugs, or dietary supplements you use. Also tell them if you smoke, drink alcohol, or use illegal drugs. Some items may interact with your medicine. What should I watch for while using this medication? Visit your care team for regular checks on your progress. Tell your care team if your symptoms do not start to get better or if they get worse. What side effects may I notice from receiving this medication? Side effects that you should report to your care team as soon as possible: Allergic reactions--skin rash, itching, hives, swelling of the face, lips, tongue, or throat Side effects that usually do not require medical attention (report to your care team if they continue or are bothersome): Constipation Fatigue Loss of appetite with weight loss Nausea This list may not describe all possible side effects. Call your doctor for medical advice about side effects. You may report side effects to FDA at 1-800-FDA-1088. Where should I keep my medication? Keep out of the reach of  children and pets. Store at room temperature between 20 and 25 degrees C (68 and 77 degrees F). Get rid of any unused medication after the expiration date. To get rid of medications that are no longer needed or have expired: Take the medication to a medication take-back program. Check with your pharmacy or law enforcement to find a location. If you cannot return the medication, check the label or package insert to see  if the medication should be thrown out in the garbage or flushed down the toilet. If you are not sure, ask your care team. If it is safe to put it in the trash, take the medication out of the container. Mix the medication with cat litter, dirt, coffee grounds, or other unwanted substance. Seal the mixture in a bag or container. Put it in the trash. NOTE: This sheet is a summary. It may not cover all possible information. If you have questions about this medicine, talk to your doctor, pharmacist, or health care provider.  2023 Elsevier/Gold Standard (2019-11-30 00:00:00)

## 2021-12-27 ENCOUNTER — Other Ambulatory Visit (HOSPITAL_COMMUNITY): Payer: Self-pay

## 2021-12-27 NOTE — Progress Notes (Unsigned)
Chief Complaint:   OBESITY Victoria [name] is here to discuss her progress with her obesity treatment plan along with follow-up of her obesity related diagnoses. Victoria [name] is on the Category 2 Plan and states she is following her eating plan approximately 0% of the time. Victoria [name] states she is doing 0 minutes 0 times per week.  Today's visit was #: 3 Starting weight: 275 lbs Starting date: 11/20/2021 Today's weight: 271 lbs Today's date: 12/18/2021 Total lbs lost to date: 4 Total lbs lost since last in-office visit: 0  Interim History: Victoria [name] reports that she is not implementing the plan.  She does not feel ready and she does not have the "energy" to do it.  She has been using protein shakes to avoid skipping meals.  She feels stressed by work and her husband's health status.  She had Roux-en-y in 2016 and she reports problems with recurrent nausea.  She denies abdominal pain or vomiting.  Subjective:   1. Depressed mood Victoria [name] has a new diagnosis. Her PHQ-9 score was elevated.  She does not acknowledge feeling depressed.  She has a lot of vegetative symptoms.  I recommended EAP and provided a list of community providers for counseling.  2. Obstructive sleep apnea Victoria [name] notes her OSA is mild and she will be starting CPAP.  3. Vitamin D deficiency Victoria [name] is currently taking prescription vitamin D 50,000 IU each week. She denies nausea, vomiting or muscle weakness.  Assessment/Plan:   1. Depressed mood Victoria [name] will let us know or let her PCP know if she is interested in treatment, as this is currently affecting her weight management.  2. Obstructive sleep apnea Victoria [name] will continue her CPAP therapy and weight loss therapy when she is ready.  3. Vitamin D deficiency Victoria [name] will continue prescription Vitamin D 50,000 IU every week, and we will refill for 2 months. She will follow-up for routine testing of Vitamin D, at least 2-3 times per year to avoid over-replacement.  4. Obesity with  current BMI of 43.8 Victoria [name] is currently in the action stage of change but I feel that she may be experiencing depression or symptoms of burnout related to work environment.  As such, her goal is to continue with weight loss efforts. She is not ready to implement the plan but she will consider protein shakes to avoid skipping meals.  I also recommended that she see her surgeon due to persistent nausea.   Exercise goals: No exercise has been prescribed at this time.  Behavioral modification strategies: increasing lean protein intake, no skipping meals, meal planning and cooking strategies, and planning for success.  Victoria [name] has agreed to follow-up with our clinic in 2 weeks. She was informed of the importance of frequent follow-up visits to maximize her success with intensive lifestyle modifications for her multiple health conditions.   Objective:   Blood pressure 126/82, pulse 62, temperature 98.2 F (36.8 C), height '5\' 6"'$  (1.676 m), weight 271 lb 6.4 oz (123.1 kg), SpO2 (!) 62 %. Body mass index is 43.81 kg/m.  General: Cooperative, alert, well developed, in no acute distress. HEENT: Conjunctivae and lids unremarkable. Cardiovascular: Regular rhythm.  Lungs: Normal work of breathing. Neurologic: No focal deficits.   Lab Results  Component Value Date   CREATININE 0.95 11/20/2021   BUN 14 11/20/2021   NA 141 11/20/2021   K 4.1 11/20/2021   CL 104 11/20/2021   CO2 22 11/20/2021   Lab Results  Component Value Date   ALT 16 11/20/2021  AST 15 11/20/2021   ALKPHOS 57 11/20/2021   BILITOT 0.2 11/20/2021   Lab Results  Component Value Date   HGBA1C 5.7 (H) 11/20/2021   HGBA1C 5.4 06/30/2020   HGBA1C 5.7 (H) 05/16/2020   HGBA1C 5.5 03/15/2017   HGBA1C 5.7 (H) 09/09/2015   Lab Results  Component Value Date   INSULIN 10.0 11/20/2021   INSULIN 6.2 07/07/2020   INSULIN 4 09/16/2015   Lab Results  Component Value Date   TSH 1.91 06/29/2021   Lab Results  Component Value  Date   CHOL 220 (H) 06/29/2021   HDL 89 06/29/2021   LDLCALC 113 (H) 06/29/2021   TRIG 79 06/29/2021   CHOLHDL 2.5 06/29/2021   Lab Results  Component Value Date   VD25OH 29.4 (L) 11/20/2021   VD25OH 18.9 (L) 07/07/2020   VD25OH 41 10/02/2018   Lab Results  Component Value Date   WBC 4.4 08/01/2021   HGB 13.6 08/01/2021   HCT 42.7 11/20/2021   MCV 99.0 08/01/2021   PLT 217 08/01/2021   Lab Results  Component Value Date   IRON 81 07/12/2021   TIBC 374 07/12/2021   FERRITIN 13 07/12/2021   Attestation Statements:   Reviewed by clinician on day of visit: allergies, medications, problem list, medical history, surgical history, family history, social history, and previous encounter notes.  Time spent on visit including pre-visit chart review and post-visit care and charting was 20 minutes.   Wilhemena Durie, am acting as transcriptionist for Thomes Dinning, MD.  I have reviewed the above documentation for accuracy and completeness, and I agree with the above. -Thomes Dinning, MD

## 2021-12-28 ENCOUNTER — Other Ambulatory Visit (HOSPITAL_COMMUNITY): Payer: Self-pay

## 2021-12-28 MED ORDER — ERGOCALCIFEROL 1.25 MG (50000 UT) PO CAPS
50000.0000 [IU] | ORAL_CAPSULE | ORAL | 1 refills | Status: DC
  Filled 2021-12-28: qty 8, fill #0
  Filled 2022-01-03: qty 8, 28d supply, fill #0
  Filled 2022-01-12: qty 16, 56d supply, fill #0

## 2022-01-02 ENCOUNTER — Other Ambulatory Visit (HOSPITAL_COMMUNITY): Payer: Self-pay

## 2022-01-03 ENCOUNTER — Encounter (INDEPENDENT_AMBULATORY_CARE_PROVIDER_SITE_OTHER): Payer: Self-pay | Admitting: Internal Medicine

## 2022-01-03 ENCOUNTER — Other Ambulatory Visit (HOSPITAL_COMMUNITY): Payer: Self-pay

## 2022-01-03 ENCOUNTER — Telehealth: Payer: Self-pay | Admitting: Neurology

## 2022-01-03 ENCOUNTER — Ambulatory Visit (INDEPENDENT_AMBULATORY_CARE_PROVIDER_SITE_OTHER): Payer: No Typology Code available for payment source | Admitting: Internal Medicine

## 2022-01-03 VITALS — BP 117/74 | HR 59 | Ht 66.0 in | Wt 265.0 lb

## 2022-01-03 DIAGNOSIS — Z6841 Body Mass Index (BMI) 40.0 and over, adult: Secondary | ICD-10-CM

## 2022-01-03 DIAGNOSIS — E669 Obesity, unspecified: Secondary | ICD-10-CM

## 2022-01-03 DIAGNOSIS — R4589 Other symptoms and signs involving emotional state: Secondary | ICD-10-CM | POA: Diagnosis not present

## 2022-01-03 MED ORDER — ESCITALOPRAM OXALATE 10 MG PO TABS
10.0000 mg | ORAL_TABLET | Freq: Every day | ORAL | 0 refills | Status: DC
  Filled 2022-01-03: qty 30, 30d supply, fill #0

## 2022-01-03 NOTE — Telephone Encounter (Signed)
PA completed on [facility]/ [employer] KEY: [id number] Will await determination

## 2022-01-03 NOTE — Telephone Encounter (Signed)
Received fax from [facility] that [facility] approved 01/03/22-07/03/22. PA ref# [id number]

## 2022-01-04 ENCOUNTER — Other Ambulatory Visit: Payer: No Typology Code available for payment source

## 2022-01-04 DIAGNOSIS — K912 Postsurgical malabsorption, not elsewhere classified: Secondary | ICD-10-CM

## 2022-01-04 DIAGNOSIS — R7989 Other specified abnormal findings of blood chemistry: Secondary | ICD-10-CM

## 2022-01-04 DIAGNOSIS — E611 Iron deficiency: Secondary | ICD-10-CM

## 2022-01-04 DIAGNOSIS — K909 Intestinal malabsorption, unspecified: Secondary | ICD-10-CM

## 2022-01-04 DIAGNOSIS — E161 Other hypoglycemia: Secondary | ICD-10-CM

## 2022-01-04 DIAGNOSIS — F411 Generalized anxiety disorder: Secondary | ICD-10-CM

## 2022-01-04 DIAGNOSIS — D709 Neutropenia, unspecified: Secondary | ICD-10-CM

## 2022-01-04 LAB — LIPID PANEL
Cholesterol: 177 mg/dL (ref <200)
HDL: 64 mg/dL (ref >=50)
LDL Cholesterol (Calc): 98 mg/dL (calc)
Non-HDL Cholesterol (Calc): 113 mg/dL (calc) (ref <130)
Total CHOL/HDL Ratio: 2.8 (calc) (ref <5.0)
Triglycerides: 60 mg/dL (ref <150)

## 2022-01-04 NOTE — Addendum Note (Signed)
Addended by: Geradine Girt D on: 01/04/2022 09:09 AM   Modules accepted: Orders

## 2022-01-04 NOTE — Addendum Note (Signed)
Addended by: Geradine Girt D on: 01/04/2022 10:58 AM   Modules accepted: Orders

## 2022-01-04 NOTE — Addendum Note (Signed)
Addended by: Geradine Girt D on: 01/04/2022 11:26 AM   Modules accepted: Orders

## 2022-01-04 NOTE — Addendum Note (Signed)
Addended by: Geradine Girt D on: 01/04/2022 11:23 AM   Modules accepted: Orders

## 2022-01-08 ENCOUNTER — Other Ambulatory Visit (HOSPITAL_COMMUNITY): Payer: Self-pay

## 2022-01-11 ENCOUNTER — Encounter: Payer: Self-pay | Admitting: Physician Assistant

## 2022-01-11 ENCOUNTER — Ambulatory Visit (INDEPENDENT_AMBULATORY_CARE_PROVIDER_SITE_OTHER): Payer: No Typology Code available for payment source | Admitting: Physician Assistant

## 2022-01-11 ENCOUNTER — Telehealth: Payer: No Typology Code available for payment source

## 2022-01-11 ENCOUNTER — Ambulatory Visit (INDEPENDENT_AMBULATORY_CARE_PROVIDER_SITE_OTHER): Payer: No Typology Code available for payment source

## 2022-01-11 DIAGNOSIS — M25512 Pain in left shoulder: Secondary | ICD-10-CM | POA: Diagnosis not present

## 2022-01-11 DIAGNOSIS — G8929 Other chronic pain: Secondary | ICD-10-CM | POA: Diagnosis not present

## 2022-01-11 DIAGNOSIS — R200 Anesthesia of skin: Secondary | ICD-10-CM

## 2022-01-11 MED ORDER — METHYLPREDNISOLONE ACETATE 40 MG/ML IJ SUSP
40.0000 mg | INTRAMUSCULAR | Status: AC | PRN
  Administered 2022-01-11: 40 mg via INTRA_ARTICULAR

## 2022-01-11 MED ORDER — LIDOCAINE HCL 1 % IJ SOLN
3.0000 mL | INTRAMUSCULAR | Status: AC | PRN
  Administered 2022-01-11: 3 mL

## 2022-01-11 NOTE — Progress Notes (Addendum)
Office Visit Note   Patient: Victoria [name]           Date of Birth: 01-10-1961           MRN: [id number] Visit Date: 01/11/2022              Requested by: Elby Showers, MD [address] [place],   [id number] PCP: Elby Showers, MD   Assessment & Plan: Visit Diagnoses:  1. Acute pain of left shoulder   2. Hand numbness     Plan: Discussed with her performing a left shoulder injection for both therapeutic and diagnostic purposes.  She will be mindful of how much improvement she got with the injection.  She is shown Codman, wall crawls, pendulum, and forward flexion exercises of the shoulder.  We will obtain EMG nerve conduction studies due to the paresthesia involving the left hand.  Have her follow-up after the EMG nerve conduction studies to go over the results and discuss further treatment.  Follow-Up Instructions: Return After EMG nerve conduction studies.   Orders:  Orders Placed This Encounter  Procedures   Large Joint Inj   XR Shoulder Left   Ambulatory referral to Physical Medicine Rehab   No orders of the defined types were placed in this encounter.     Procedures: Large Joint Inj on 01/11/2022 4:39 PM Indications: pain Details: 22 G 1.5 in needle, superior approach  Arthrogram: No  Medications: 3 mL lidocaine 1 %; 40 mg methylPREDNISolone acetate 40 MG/ML Outcome: tolerated well, no immediate complications Procedure, treatment alternatives, risks and benefits explained, specific risks discussed. Consent was given by the patient. Immediately prior to procedure a time out was called to verify the correct patient, procedure, equipment, support staff and site/side marked as required. Patient was prepped and draped in the usual sterile fashion.       Clinical Data: No additional findings.   Subjective: Chief Complaint  Patient presents with   Left Shoulder - Pain   Neck - Pain    HPI Victoria [name] comes in today for left  shoulder pain has been ongoing for a few months.  No known injury.  She has decreased range of motion of the left shoulder.  She is also having numbness tingling involving her left forearm and hand but mainly the hand it involves the whole hand going to sleep.  She does have some neck pain she underwent an MRI of her cervical spine on 12/04/2021.  Which showed old left foraminal disc protrusion at C7-T1 that potentially could affect the left C8 nerve root.  Otherwise multilevel cervical spondylosis.  She does note some neck pain and shoulder pain posteriorly.  She has had no treatment for the shoulder and the hand numbness.  Notes the only treatment she is having Celebrex and Neurontin which she feels helps some but minimally with all 3 complaints..  Review of Systems  Constitutional:  Negative for chills and fever.  Musculoskeletal:  Positive for neck pain.     Objective: Vital Signs: There were no vitals taken for this visit.  Physical Exam Constitutional:      Appearance: She is not ill-appearing or diaphoretic.  Cardiovascular:     Pulses: Normal pulses.  Pulmonary:     Effort: Pulmonary effort is normal.  Neurological:     Mental Status: She is alert and oriented to person, place, and time.  Psychiatric:        Mood and Affect: Mood normal.  Ortho Exam Goal spine full range of motion without pain.  Tenderness along the medial border of the left scapula.  Bilateral shoulders: 5 out of 5 strength with external and internal rotation against resistance.  Empty can test is negative bilaterally.  Liftoff test negative bilaterally.  Limited forward flexion of the left shoulder to 140 degrees passively 120 degrees actively.  Right shoulder full range of motion without pain.  Impingement testing positive on the left.  Bilateral hands: Subjective decrease sensation at the tips of the fingers throughout the left hand.  Full motor of both hands.  Right wrist negative compression and Tinel's over  the median nerve.  Right wrist negative Tinel's over the median nerve positive compression test over the median nerve at the wrist.  Phalen's is negative on the right positive on the left.  Specialty Comments:  No specialty comments available.  Imaging: XR Shoulder Left  Result Date: 01/11/2022 Left shoulder 3 views: Shoulder is well located.  Glenohumeral joints well-maintained.  No acute fractures or bony abnormalities.    PMFS History: Patient Active Problem List   Diagnosis Date Noted   Migraine without aura and without status migrainosus, not intractable 12/26/2021   Depressed mood 12/19/2021   Obstructive sleep apnea 12/19/2021   Other hypoglycemia-post bariatric 12/05/2021   Secondary hyperparathyroidism ([place]) 12/05/2021   Inadequate fluid intake 12/05/2021   Inadequate sleep hygiene 12/05/2021   Class 3 severe obesity with body mass index (BMI) of 40.0 to 44.9 in adult ([place]) 12/05/2021   Hx of gastric bypass 12/05/2021   SOBOE (shortness of breath on exertion) 11/21/2021   Other fatigue 11/21/2021   Vitamin D deficiency 11/21/2021   Iron deficiency 11/21/2021   Hypoglycemia after bariatric surgery 11/21/2021   Depression screening 11/21/2021   Class 3 severe obesity with serious comorbidity and body mass index (BMI) of 40.0 to 44.9 in adult ([place]) 11/21/2021   Mild obstructive sleep apnea 11/17/2021   Vertigo 11/17/2021   Excessive daytime sleepiness 10/23/2021   Spinal stenosis of lumbar region 07/07/2020   Unilateral primary osteoarthritis, right knee 06/08/2020   Displacement of lumbar intervertebral disc 06/02/2020   Lumbar spondylosis 06/02/2020   Lumbar stenosis with neurogenic claudication 06/02/2020   Arm numbness 05/30/2020   Chronic neck pain 05/30/2020   Other chronic pain 05/30/2020   Elevated blood-pressure reading, without diagnosis of hypertension 05/30/2020   Status post arthroscopy of right knee 12/11/2019   Polyethylene wear of left knee joint  prosthesis ([place]) 09/09/2019   Acute medial meniscal tear, right, subsequent encounter 08/27/2019   History of total left knee replacement 07/16/2019   Peroneal neuropathy at knee, left 06/25/2019   Lumbosacral radiculopathy 06/07/2019   Iron malabsorption 10/16/2018   IDA (iron deficiency anemia) 10/16/2018   OA (osteoarthritis) of knee 01/20/2018   Acute meniscal tear, medial 07/17/2017   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   Failed total hip arthroplasty, 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/2017   Pain in left knee 04/11/2017   Insomnia 11/08/2016   Pernicious anemia 11/08/2016   Leukopenia 07/09/2016   Hypocupremia 07/09/2016   Palpitations 04/12/2016   Shortness of breath 04/12/2016   Reactive hypoglycemia 10/31/2015   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/31/2015   Lap gastric bypass May 2016 07/06/2014   History of Clostridium difficile infection 08/12/2011   Postoperative hypothyroidism 08/26/2010   Gastroesophageal reflux disease 08/26/2010   Vitamin D deficiency 08/26/2010   Fibrocystic disease of breast 08/26/2010   Cobalamin deficiency 08/26/2010   Morbid obesity ([place]) 03/20/2010   Backache 03/20/2010  Past Medical History:  Diagnosis Date   Back pain    Bursitis    right shoulder   Chronic leukopenia    intermittant since 2010, followed by Dr. Renold Genta now   Chronic neck pain    Constipation    Degenerative joint disease of low back 09/02/2015   Dr. Maureen Ralphs   Displacement of lumbar intervertebral disc    Edema of both lower legs    Fibrocystic breast    Gallbladder problem    GERD (gastroesophageal reflux disease)    Headache    per pt more stress/tension   Heart palpitations    SEE EPIC ENCOUTNER , CARDIOLOGY DR. Tressia Miners TURNER 2018; reports on 05-16-17 " i haven't had any bouts of those lately"     Hemorrhoids    Hidradenitis suppurativa    History of Clostridium difficile infection 12/2010   History of exercise intolerance    ETT on 04-27-2016-- negative ([facility] treadmill  score 7)   History of Helicobacter pylori infection 2001 and 09/ 2012   HSV-2 infection    genital   Hx of adenomatous colonic polyps 10/17/2007   Hydradenitis    per pt currently treated with Humira injection   Hypocupremia    Hypothyroidism    Hypothyroidism, postsurgical 1980   IBS (irritable bowel syndrome)    Insomnia    Iron deficiency    Joint pain    Leukopenia    Lumbosacral radiculopathy    Medial meniscus tear    right knee   Mild obstructive sleep apnea    Mitral regurgitation    Mild to moderate by echo 09/2021   Numbness and tingling of right arm    Obesity    Osteoarthritis    Palpitations    Pernicious anemia    b12 def   Peroneal neuropathy    PONV (postoperative nausea and vomiting)    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   Prediabetes    Reactive hypoglycemia followed by pcp   post gastrectomy dumping syndrome   SOB (shortness of breath)    Spinal stenosis    Tendonitis    right shoulder   Varicose veins    Vertigo    Vitamin B 12 deficiency    Vitamin D deficiency     Family History  Problem Relation Age of Onset   High blood pressure Mother    Hypertension Mother    Headache Mother    High Cholesterol Mother    Thyroid disease Mother    Obesity Mother    Kidney disease Mother    Diabetes Father    High blood pressure Father    High Cholesterol Father    Heart disease Father    Obesity Father    Diabetes Sister    Hypertension Sister    Migraines Neg Hx     Past Surgical History:  Procedure Laterality Date   BREATH TEK H PYLORI N/A 05/03/2014   Procedure: BREATH TEK H PYLORI;  Surgeon: Kaylyn Lim, MD;  Location: [facility];  Service: General;  Laterality: N/A;   CARDIOVASCULAR STRESS TEST  03/11/2013   dr Tressia Miners turner   Low risk nuclear study w/ a very small anterior perfusion defect that most likely represents breast attenuation artifact, less likely this could represent a true perfusion abnormality in the distribution of  diagonal branch of the LAD/  normal LV function and wall motion , ef 66%   CATARACT EXTRACTION W/ INTRAOCULAR LENS  IMPLANT, BILATERAL  10/2017   [facility]AN  [place]   COLONOSCOPY  02/2017   Dr [name] ; per patient they found 7 polyps with polypectomy; on 5 year track    ESOPHAGOGASTRODUODENOSCOPY  02/01/2021   FINE NEEDLE ASPIRATION Left 05/22/2017   Procedure: LEFT KNEE ASPIRATION;  Surgeon: Gaynelle Arabian, MD;  Location: [facility];  Service: Orthopedics;  Laterality: Left;   GASTRIC ROUX-EN-Y N/A 07/06/2014   Procedure: LAPAROSCOPIC ROUX-EN-Y GASTRIC BYPASS, ENTEROLYSIS OF ADHESIONS WITH UPPER ENDOSCOPY;  Surgeon: Johnathan Hausen, MD;  Location: [facility];  Service: General;  Laterality: N/A;   HAMMER TOE SURGERY Bilateral 02/17/2008   bilateral foot digit's 2 and 5   HIATAL HERNIA REPAIR N/A 07/06/2014   Procedure: LAPAROSCOPIC REPAIR OF HIATAL HERNIA;  Surgeon: Johnathan Hausen, MD;  Location: [facility];  Service: General;  Laterality: N/A;   I & D KNEE WITH POLY EXCHANGE Left 12/11/2019   Procedure: LEFT KNEE POLY-LINER EXCHANGE;  Surgeon: Mcarthur Rossetti, MD;  Location: [place] [facility];  Service: Orthopedics;  Laterality: Left;   KNEE ARTHROSCOPY Right 12/11/2019   Procedure: RIGHT KNEE ARTHROSCOPY WITH PARTIAL MEDIAL MENISCECTOMY;  Surgeon: Mcarthur Rossetti, MD;  Location: [place];  Service: Orthopedics;  Laterality: Right;   KNEE ARTHROSCOPY WITH MEDIAL MENISECTOMY Left 07/17/2017   Procedure: LEFT KNEE ARTHROSCOPY WITH MEDIAL LATERAL MENISECTOMY;  Surgeon: Gaynelle Arabian, MD;  Location: [facility];  Service: Orthopedics;  Laterality: Left;   MASS EXCISION N/A 10/11/2016   Procedure: EXCISION OF PERIANAL MASS;  Surgeon: [name], MD;  Location: [place] [facility];  Service: General;  Laterality: N/A;   [place]  08-25-2002   dr Margaretha Glassing   ovaries retained   TOTAL HIP ARTHROPLASTY  Right 05/21/2012   Procedure: TOTAL HIP ARTHROPLASTY;  Surgeon: Gearlean Alf, MD;  Location: [facility];  Service: Orthopedics;  Laterality: Right;   TOTAL HIP ARTHROPLASTY Left 01-31-2009  dr Wynelle Link   TOTAL HIP REVISION Left 05/22/2017   Procedure: Left hip bearing surface and head revision;  Surgeon: Gaynelle Arabian, MD;  [facility];  Service: Orthopedics;  Laterality: Left;   TOTAL KNEE ARTHROPLASTY Left 01/20/2018   Procedure: LEFT TOTAL KNEE ARTHROPLASTY;  Surgeon: Gaynelle Arabian, MD;  Location: [facility];  Service: Orthopedics;  Laterality: Left;  49mn   TRANSTHORACIC ECHOCARDIOGRAM  05/03/2016   ef 55-60%/  trivial MR/  mild TR   TUBAL LIGATION     Social History   Occupational History   Occupation: RTherapist, sports Tobacco Use   Smoking status: Never   Smokeless tobacco: Never  Vaping Use   Vaping Use: Never used  Substance and Sexual Activity   Alcohol use: No   Drug use: No   Sexual activity: Not Currently    Partners: Male    Birth control/protection: Surgical    Comment: hysterectomy

## 2022-01-11 NOTE — Telephone Encounter (Signed)
VOB submitted for Monovisc, right knee. 

## 2022-01-11 NOTE — Telephone Encounter (Signed)
This message was missed for aug. Please get auth for right knee gel injection-[facility] pt

## 2022-01-12 ENCOUNTER — Inpatient Hospital Stay: Payer: No Typology Code available for payment source | Attending: Family

## 2022-01-12 ENCOUNTER — Inpatient Hospital Stay (HOSPITAL_BASED_OUTPATIENT_CLINIC_OR_DEPARTMENT_OTHER): Payer: No Typology Code available for payment source | Admitting: Family

## 2022-01-12 ENCOUNTER — Other Ambulatory Visit (HOSPITAL_COMMUNITY): Payer: Self-pay

## 2022-01-12 ENCOUNTER — Ambulatory Visit (INDEPENDENT_AMBULATORY_CARE_PROVIDER_SITE_OTHER): Payer: No Typology Code available for payment source | Admitting: Internal Medicine

## 2022-01-12 ENCOUNTER — Encounter: Payer: Self-pay | Admitting: Internal Medicine

## 2022-01-12 ENCOUNTER — Other Ambulatory Visit: Payer: Self-pay

## 2022-01-12 ENCOUNTER — Encounter: Payer: Self-pay | Admitting: Family

## 2022-01-12 VITALS — BP 112/60 | HR 55 | Temp 97.7°F | Resp 18 | Ht 67.0 in | Wt 267.0 lb

## 2022-01-12 VITALS — BP 122/74 | HR 56 | Temp 98.7°F | Ht 67.25 in | Wt 267.8 lb

## 2022-01-12 DIAGNOSIS — Z72821 Inadequate sleep hygiene: Secondary | ICD-10-CM

## 2022-01-12 DIAGNOSIS — Z9884 Bariatric surgery status: Secondary | ICD-10-CM

## 2022-01-12 DIAGNOSIS — D508 Other iron deficiency anemias: Secondary | ICD-10-CM | POA: Diagnosis not present

## 2022-01-12 DIAGNOSIS — G4700 Insomnia, unspecified: Secondary | ICD-10-CM

## 2022-01-12 DIAGNOSIS — R5383 Other fatigue: Secondary | ICD-10-CM | POA: Diagnosis not present

## 2022-01-12 DIAGNOSIS — D509 Iron deficiency anemia, unspecified: Secondary | ICD-10-CM | POA: Insufficient documentation

## 2022-01-12 DIAGNOSIS — F321 Major depressive disorder, single episode, moderate: Secondary | ICD-10-CM | POA: Diagnosis not present

## 2022-01-12 DIAGNOSIS — F439 Reaction to severe stress, unspecified: Secondary | ICD-10-CM

## 2022-01-12 DIAGNOSIS — E7439 Other disorders of intestinal carbohydrate absorption: Secondary | ICD-10-CM

## 2022-01-12 DIAGNOSIS — R7989 Other specified abnormal findings of blood chemistry: Secondary | ICD-10-CM

## 2022-01-12 DIAGNOSIS — R4589 Other symptoms and signs involving emotional state: Secondary | ICD-10-CM

## 2022-01-12 DIAGNOSIS — Z8669 Personal history of other diseases of the nervous system and sense organs: Secondary | ICD-10-CM

## 2022-01-12 DIAGNOSIS — E890 Postprocedural hypothyroidism: Secondary | ICD-10-CM

## 2022-01-12 DIAGNOSIS — Z8639 Personal history of other endocrine, nutritional and metabolic disease: Secondary | ICD-10-CM

## 2022-01-12 DIAGNOSIS — G8929 Other chronic pain: Secondary | ICD-10-CM

## 2022-01-12 DIAGNOSIS — M7918 Myalgia, other site: Secondary | ICD-10-CM | POA: Diagnosis not present

## 2022-01-12 DIAGNOSIS — Z6841 Body Mass Index (BMI) 40.0 and over, adult: Secondary | ICD-10-CM

## 2022-01-12 LAB — CBC WITH DIFFERENTIAL (CANCER CENTER ONLY)
Abs Immature Granulocytes: 0.01 10*3/uL (ref 0.00–0.07)
Basophils Absolute: 0 10*3/uL (ref 0.0–0.1)
Basophils Relative: 0 %
Eosinophils Absolute: 0 10*3/uL (ref 0.0–0.5)
Eosinophils Relative: 0 %
HCT: 38.4 % (ref 36.0–46.0)
Hemoglobin: 12.8 g/dL (ref 12.0–15.0)
Immature Granulocytes: 0 %
Lymphocytes Relative: 25 %
Lymphs Abs: 1.3 10*3/uL (ref 0.7–4.0)
MCH: 32.7 pg (ref 26.0–34.0)
MCHC: 33.3 g/dL (ref 30.0–36.0)
MCV: 98.2 fL (ref 80.0–100.0)
Monocytes Absolute: 0.3 10*3/uL (ref 0.1–1.0)
Monocytes Relative: 6 %
Neutro Abs: 3.6 10*3/uL (ref 1.7–7.7)
Neutrophils Relative %: 69 %
Platelet Count: 219 10*3/uL (ref 150–400)
RBC: 3.91 MIL/uL (ref 3.87–5.11)
RDW: 13.6 % (ref 11.5–15.5)
WBC Count: 5.2 10*3/uL (ref 4.0–10.5)
nRBC: 0 % (ref 0.0–0.2)

## 2022-01-12 LAB — RETICULOCYTES
Immature Retic Fract: 6.3 % (ref 2.3–15.9)
RBC.: 3.9 MIL/uL (ref 3.87–5.11)
Retic Count, Absolute: 46.8 10*3/uL (ref 19.0–186.0)
Retic Ct Pct: 1.2 % (ref 0.4–3.1)

## 2022-01-12 LAB — IRON AND IRON BINDING CAPACITY (CC-WL,HP ONLY)
Iron: 123 ug/dL (ref 28–170)
Saturation Ratios: 36 % — ABNORMAL HIGH (ref 10.4–31.8)
TIBC: 343 ug/dL (ref 250–450)
UIBC: 220 ug/dL (ref 148–442)

## 2022-01-12 LAB — FERRITIN: Ferritin: 21 ng/mL (ref 11–307)

## 2022-01-12 MED ORDER — HYDROCODONE-ACETAMINOPHEN 10-325 MG PO TABS
1.0000 | ORAL_TABLET | Freq: Four times a day (QID) | ORAL | 0 refills | Status: AC | PRN
  Filled 2022-01-12: qty 15, 4d supply, fill #0

## 2022-01-12 MED ORDER — ESCITALOPRAM OXALATE 10 MG PO TABS
10.0000 mg | ORAL_TABLET | Freq: Every day | ORAL | 1 refills | Status: DC
  Filled 2022-01-12 – 2022-01-30 (×3): qty 90, 90d supply, fill #0
  Filled 2022-05-02: qty 90, 90d supply, fill #1

## 2022-01-12 NOTE — Patient Instructions (Addendum)
I think she is a candidate for pain management consultation with Dr. Letta Pate. Have refilled Norco '10mg'$  (#15 tabs). Increase Lexapro from 5 to 10 mg daily. Will be seeing Dr. Letta Median is January re possible hyperparathyroidism. Lipid panel is normal.

## 2022-01-12 NOTE — Progress Notes (Signed)
Hematology and Oncology Follow Up Visit  Victoria [name] [id number] 1961-01-20 61 y.o. 01/12/2022   Principle Diagnosis:  Iron deficiency anemia secondary to malabsorption, s/p gastric bypass in 2016    Current Therapy:        IV iron as indicated    Interim History:  Ms. Victoria [name] is here today for follow-up. She is feeling fatigued and states that she recently was diagnosed with sleep apnea and is now using a CPAP.  She has pain and mild swelling in her right knee. She is working to lose some weight prior to replacement.  She is currently going to the local weight loss clinic.  Tingling in left arm which she states is due to a bulging disc in her neck.  No falls or syncope reported.  No bleeding, bruising or petechiae.  She has mild SOB with exertion since having covid back in the spring. She uses her albuterol inhaler as needed.  She has occasional dizziness with vertigo.  No fever, chills, n/v, cough, rash, chest pain, abdominal pain or changes in bowel or bladder habits. Appetite comes and goes. She is doing her best to stay well hydrated. Her weight is stable at 267 lbs.   ECOG Performance Status: 1 - Symptomatic but completely ambulatory  Medications:  Allergies as of 01/12/2022       Reactions   Other Other (See Comments)   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   Estradiol Rash, Other (See Comments)   Local rash with estradiol patches        Medication List        Accurate as of January 12, 2022 12:55 PM. If you have any questions, ask your nurse or doctor.          albuterol 108 (90 Base) MCG/ACT inhaler Commonly known as: VENTOLIN HFA Inhale 2 puffs into the lungs every 4 hours as needed for wheezing or shortness of breath.   CALCIUM + D3 PO Take 1 tablet by mouth daily.   celecoxib 200 MG capsule Commonly known as: CELEBREX Take 1 capsule (200 mg total) by mouth 2 (two) times daily as needed.   clobetasol ointment 0.05  % Commonly known as: TEMOVATE Apply to skin twice daily as directed for up to 2 weeks as needed   cyanocobalamin 1000 MCG/ML injection Commonly known as: Dodex INJECT 1 ML INTRAMUSCULARLY EVERY MONTH   ergocalciferol 1.25 MG (50000 UT) capsule Commonly known as: VITAMIN D2 Take 1 capsule (50,000 Units total) by mouth 2 (two) times a week for Vitamin deficiency and gastric bypass.   escitalopram 10 MG tablet Commonly known as: Lexapro Take 1 tablet (10 mg total) by mouth daily.   Estradiol 1.25 MG/1.25GM Gel Commonly known as: Divigel Use one packet daily as directed   flecainide 50 MG tablet Commonly known as: TAMBOCOR Take 1 tablet by mouth 2 times daily.   gabapentin 600 MG tablet Commonly known as: NEURONTIN Take 1 tablet (600 mg total) by mouth 3 (three) times daily.   guaiFENesin 600 MG 12 hr tablet Commonly known as: Mucinex Take 1 - 2 tablets by mouth twice daily as needed for drainage.  Take with lots of water   Humira Pen 40 MG/0.8ML Pnkt Generic drug: Adalimumab Inject 1 pen subcutaneously every week   HYDROcodone-acetaminophen 10-325 MG tablet Commonly known as: NORCO Take 1 tablet by mouth every 6 (six) hours as needed for up to 5 days. Started by: Elby Showers, MD  levocetirizine 5 MG tablet Commonly known as: XYZAL Take 5 mg by mouth every evening.   levothyroxine 112 MCG tablet Commonly known as: SYNTHROID Take 1 tablet (112 mcg total) by mouth daily.   LORazepam 2 MG tablet Commonly known as: ATIVAN Take 1 tablet (2 mg total) by mouth at bedtime.   metoprolol succinate 25 MG 24 hr tablet Commonly known as: TOPROL-XL TAKE 1 TABLET BY MOUTH DAILY.   montelukast 10 MG tablet Commonly known as: Singulair Take 1 tablet by mouth at bedtime.   Olopatadine HCl 0.2 % Soln Apply 1 drop to eye daily as needed.   pantoprazole 40 MG tablet Commonly known as: PROTONIX Take 1 tablet by mouth daily 20 minutes before breakfast   prenatal  multivitamin Tabs tablet Take 1 tablet by mouth 2 (two) times daily with a meal.   promethazine 25 MG tablet Commonly known as: PHENERGAN TAKE 1 TABLET BY MOUTH EVERY 8 HOURS AS NEEDED FOR NAUSEA AND/OR VOMITING   Qulipta 60 MG Tabs Generic drug: Atogepant Take 60 mg by mouth daily.   sulfamethoxazole-trimethoprim 800-160 MG tablet Commonly known as: Bactrim DS Take 1 tablet twice daily as needed for flare.   terconazole 0.4 % vaginal cream Commonly known as: TERAZOL 7 Place 1 applicatorful vaginally at bedtime for seven nights.   Then use one applicatorful once a week for 6 months.   tiZANidine 4 MG capsule Commonly known as: Zanaflex Take 1 capsule (4 mg total) by mouth 3 (three) times daily as needed for muscle spasms.   Ubrelvy 100 MG Tabs Generic drug: Ubrogepant Take 100 mg by mouth every 2 (two) hours as needed. Maximum '200mg'$  a day.        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  Review of Systems: All other 10 point review of systems is negative.   Physical Exam:  vitals were not taken for this visit.   Wt Readings from Last 3 Encounters:  01/12/22 267 lb 12.8 oz (121.5 kg)  01/03/22 265 lb (120.2 kg)  12/26/21 272 lb 9.6 oz (123.7 kg)    Ocular: Sclerae unicteric, pupils equal, round and reactive to light Ear-nose-throat: Oropharynx clear, dentition fair Lymphatic: No cervical or supraclavicular adenopathy Lungs no rales or rhonchi, good excursion bilaterally Heart regular rate and rhythm, no murmur appreciated Abd soft, nontender, positive bowel sounds MSK no focal spinal tenderness, no joint edema Neuro: non-focal, well-oriented, appropriate affect Breasts: Deferred     Lab Results  Component Value Date   WBC 4.4 08/01/2021   HGB 13.6 08/01/2021   HCT 42.7 11/20/2021   MCV 99.0 08/01/2021   PLT 217 08/01/2021   Lab Results  Component Value Date   FERRITIN 13 07/12/2021   IRON 81 07/12/2021   TIBC 374 07/12/2021   UIBC 293 07/12/2021   IRONPCTSAT 22 07/12/2021   Lab Results  Component Value Date   RETICCTPCT 1.5 07/07/2021   RBC 4.19 08/01/2021   RETICCTABS 56,550 07/07/2021   No results found for: "KPAFRELGTCHN", "LAMBDASER", "KAPLAMBRATIO" Lab Results  Component Value Date   IGGSERUM 1,609 (H) 05/16/2020   IGMSERUM 106 05/16/2020   No results found for: "TOTALPROTELP", "ALBUMINELP", "A1GS", "A2GS", "BETS", "BETA2SER", "GAMS", "MSPIKE", "SPEI"  Chemistry      Component Value Date/Time   NA 141 11/20/2021 1104   K 4.1 11/20/2021 1104   CL 104 11/20/2021 1104   CO2 22 11/20/2021 1104   BUN 14 11/20/2021 1104   CREATININE 0.95 11/20/2021 1104   CREATININE 0.94 06/29/2021 1105      Component Value Date/Time   CALCIUM 8.6 (L) 11/20/2021 1104   ALKPHOS 57 11/20/2021 1104   AST 15 11/20/2021 1104   AST 26 10/16/2018 0903   ALT 16 11/20/2021 1104   ALT 26 10/16/2018 0903   BILITOT 0.2 11/20/2021 1104   BILITOT 0.5 10/16/2018 4481       Impression and Plan: Ms. Ciliberto is a very pleasant 61 yo African American female with iron deficiency anemia secondary to malabsorption since laparoscopic gastric bypass in 2016.  Iron studies are pending.  Follow-up in 1 year.    Lottie Dawson, NP 11/17/202312:55 PM

## 2022-01-16 ENCOUNTER — Telehealth: Payer: Self-pay

## 2022-01-16 NOTE — Telephone Encounter (Signed)
Talked with [name] with [facility] and started PA for Monovisc, right knee. Faxed office notes to [facility] at [phone].  Pending PA reference# [id number]

## 2022-01-22 ENCOUNTER — Telehealth: Payer: Self-pay | Admitting: Physical Medicine and Rehabilitation

## 2022-01-22 NOTE — Telephone Encounter (Signed)
I called patient to schedule EMG/NCV BUE. She did not have her calendar on her and asked that we call her back tomorrow to schedule. She is off, so we can call anytime.

## 2022-01-22 NOTE — Progress Notes (Unsigned)
Chief Complaint:   OBESITY Victoria [name] is here to discuss her progress with her obesity treatment plan along with follow-up of her obesity related diagnoses. Victoria [name] is not on a plan and not currently exercising.  Today's visit was #: 4 Starting weight: 275 lbs Starting date: 11/20/2021 Today's weight: 265 lbs Today's date: 01/03/2022 Total lbs lost to date: 10 Total lbs lost since last in-office visit: 6  Interim History: Victoria [name] has been getting most of her nutrition using protein shakes and acknowledges snacking throughout the day. She endorses fatigue and low levels of energy. Self-administered PHQ-9, moderate.   Subjective:   1. Depressed mood Self-administered PHQ-9, moderate. Pt provided with resources at last OV. Symptoms may be contributing to poor sleep and emotional eating.  Assessment/Plan:   1. Depressed mood Pt counseled on resources. She is not a candidate for bupropion due to problems with heart rhythm and palpitations. Start escitalopram '5mg'$  in the evening. Refill x 1.  2. Obesity with current BMI of 42.8 Victoria [name] is currently in the action stage of change. As such, her goal is to continue with weight loss efforts. She has agreed to start meal replacements vs protein supplement.   Exercise goals:  As is  Behavioral modification strategies: increasing lean protein intake, no skipping meals, better snacking choices, and planning for success.  Victoria [name] has agreed to follow-up with our clinic in 2-3 weeks. She was informed of the importance of frequent follow-up visits to maximize her success with intensive lifestyle modifications for her multiple health conditions.   Objective:   Blood pressure 117/74, pulse (!) 59, height '5\' 6"'$  (1.676 m), weight 265 lb (120.2 kg), SpO2 100 %. Body mass index is 42.77 kg/m.  General: Cooperative, alert, well developed, in no acute distress. HEENT: Conjunctivae and lids unremarkable. Cardiovascular: Regular rhythm.  Lungs: Normal  work of breathing. Neurologic: No focal deficits.   Lab Results  Component Value Date   CREATININE 0.95 11/20/2021   BUN 14 11/20/2021   NA 141 11/20/2021   K 4.1 11/20/2021   CL 104 11/20/2021   CO2 22 11/20/2021   Lab Results  Component Value Date   ALT 16 11/20/2021   AST 15 11/20/2021   ALKPHOS 57 11/20/2021   BILITOT 0.2 11/20/2021   Lab Results  Component Value Date   HGBA1C 5.7 (H) 11/20/2021   HGBA1C 5.4 06/30/2020   HGBA1C 5.7 (H) 05/16/2020   HGBA1C 5.5 03/15/2017   HGBA1C 5.7 (H) 09/09/2015   Lab Results  Component Value Date   INSULIN 10.0 11/20/2021   INSULIN 6.2 07/07/2020   INSULIN 4 09/16/2015   Lab Results  Component Value Date   TSH 1.91 06/29/2021   Lab Results  Component Value Date   CHOL 177 01/04/2022   HDL 64 01/04/2022   LDLCALC 98 01/04/2022   TRIG 60 01/04/2022   CHOLHDL 2.8 01/04/2022   Lab Results  Component Value Date   VD25OH 29.4 (L) 11/20/2021   VD25OH 18.9 (L) 07/07/2020   VD25OH 41 10/02/2018   Lab Results  Component Value Date   WBC 5.2 01/12/2022   HGB 12.8 01/12/2022   HCT 38.4 01/12/2022   MCV 98.2 01/12/2022   PLT 219 01/12/2022   Lab Results  Component Value Date   IRON 123 01/12/2022   TIBC 343 01/12/2022   FERRITIN 21 01/12/2022   Attestation Statements:   Reviewed by clinician on day of visit: allergies, medications, problem list, medical history, surgical history, family history, social history,  and previous encounter notes.  Time spent on visit including pre-visit chart review and post-visit care and charting was 20 minutes.   I, Kathlene November, BS, CMA, am acting as transcriptionist for Thomes Dinning, MD.  I have reviewed the above documentation for accuracy and completeness, and I agree with the above. -  ***

## 2022-01-22 NOTE — Telephone Encounter (Signed)
Pt states sh missed a call from [employer].

## 2022-01-23 ENCOUNTER — Ambulatory Visit (INDEPENDENT_AMBULATORY_CARE_PROVIDER_SITE_OTHER): Payer: No Typology Code available for payment source | Admitting: Physician Assistant

## 2022-01-23 ENCOUNTER — Encounter (INDEPENDENT_AMBULATORY_CARE_PROVIDER_SITE_OTHER): Payer: Self-pay | Admitting: Physician Assistant

## 2022-01-23 VITALS — BP 110/73 | HR 53 | Temp 97.8°F | Ht 67.0 in | Wt 262.0 lb

## 2022-01-23 DIAGNOSIS — G4733 Obstructive sleep apnea (adult) (pediatric): Secondary | ICD-10-CM | POA: Diagnosis not present

## 2022-01-23 DIAGNOSIS — E669 Obesity, unspecified: Secondary | ICD-10-CM | POA: Diagnosis not present

## 2022-01-23 DIAGNOSIS — Z6841 Body Mass Index (BMI) 40.0 and over, adult: Secondary | ICD-10-CM

## 2022-01-23 DIAGNOSIS — R4589 Other symptoms and signs involving emotional state: Secondary | ICD-10-CM | POA: Diagnosis not present

## 2022-01-23 DIAGNOSIS — E559 Vitamin D deficiency, unspecified: Secondary | ICD-10-CM | POA: Diagnosis not present

## 2022-01-23 NOTE — Telephone Encounter (Signed)
Spoke with patient and scheduled NCV for 02/02/22

## 2022-01-24 ENCOUNTER — Telehealth: Payer: Self-pay

## 2022-01-24 NOTE — Telephone Encounter (Signed)
FYI  Received fax from [facility] stating that Monovisc is not medically necessary for patient due to patient no having physical therapy, continued pain management, and cortisone injections to her knee if they continue to provide relief of symptoms.  Please advise on next option for patient.

## 2022-01-29 ENCOUNTER — Other Ambulatory Visit (HOSPITAL_COMMUNITY): Payer: Self-pay

## 2022-01-30 ENCOUNTER — Other Ambulatory Visit (HOSPITAL_BASED_OUTPATIENT_CLINIC_OR_DEPARTMENT_OTHER): Payer: Self-pay

## 2022-01-30 ENCOUNTER — Other Ambulatory Visit (HOSPITAL_COMMUNITY): Payer: Self-pay

## 2022-01-31 NOTE — Progress Notes (Signed)
Chief Complaint:   OBESITY Victoria [name] is here to discuss her progress with her obesity treatment plan along with follow-up of her obesity related diagnoses. Victoria [name] is on protein shakes and states she is following her eating plan approximately 0% of the time. [name] states she is exercising 0 minutes 0 times per week.  Today's visit was #: 5 Starting weight: 275 lbs Starting date: 11/20/2021 Today's weight: 262 lbs Today's date: 01/23/2022 Total lbs lost to date: 13 lbs Total lbs lost since last in-office visit: 3  Interim History: Victoria [name] reports little appetite--drinking protein shakes due to decreased appetite. Breakfast--shake, Lunch--yogurt, [place] and dressing, dinner--[employer] chili. We discussed the importance of eating real food vs. Protein shakes and how this impacts her metabolism.   Subjective:   1. Vitamin D deficiency [name] is currently taking prescription Vit D 50,000 IU twice a week. Denies any side effects. Some fatigue--last level of 29.4. 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  2. Obstructive sleep apnea [name] reports using CPAP consistently now. She notes fatigue.  3. Depressed mood [name] started Lexapro and PCP increased to 10 mg daily. Denies any side effects. She does not feel it has made much difference as of yet.Denies SI/HI.   Assessment/Plan:   1. Vitamin D deficiency Continue taking Vit D. Will follow up on level in 2-3 months. At risk for over supplementation.  2. Obstructive sleep apnea Continue using CPAP, healthy eating plan and exercise.  3. Depressed mood Will monitor at next visit. Continue Lexapro 10 mg daily. Continue healthy eating plan and exercise.  4. Obesity with current BMI of 42.4 Victoria [name] is currently in the action stage of change. As such, her goal is to continue with weight loss efforts. She has agreed to the Category 4 Plan.   Exercise goals: All adults should avoid inactivity. Some physical activity is  better than none, and adults who participate in any amount of physical activity gain some health benefits.  Behavioral modification strategies: increasing lean protein intake, no skipping meals, and meal planning and cooking strategies.  Victoria [name] has agreed to follow-up with our clinic in 3 weeks. She was informed of the importance of frequent follow-up visits to maximize her success with intensive lifestyle modifications for her multiple health conditions.   Objective:   Blood pressure 110/73, pulse (!) 53, temperature 97.8 F (36.6 C), height '5\' 7"'$  (1.702 m), weight 262 lb (118.8 kg), SpO2 97 %. Body mass index is 41.04 kg/m.  General: Cooperative, alert, well developed, in no acute distress. HEENT: Conjunctivae and lids unremarkable. Cardiovascular: Regular rhythm.  Lungs: Normal work of breathing. Neurologic: No focal deficits.   Lab Results  Component Value Date   CREATININE 0.95 11/20/2021   BUN 14 11/20/2021   NA 141 11/20/2021   K 4.1 11/20/2021   CL 104 11/20/2021   CO2 22 11/20/2021   Lab Results  Component Value Date   ALT 16 11/20/2021   AST 15 11/20/2021   ALKPHOS 57 11/20/2021   BILITOT 0.2 11/20/2021   Lab Results  Component Value Date   HGBA1C 5.7 (H) 11/20/2021   HGBA1C 5.4 06/30/2020   HGBA1C 5.7 (H) 05/16/2020   HGBA1C 5.5 03/15/2017   HGBA1C 5.7 (H) 09/09/2015   Lab Results  Component Value Date   INSULIN 10.0 11/20/2021   INSULIN 6.2 07/07/2020   INSULIN 4 09/16/2015   Lab Results  Component Value Date   TSH 1.91 06/29/2021   Lab Results  Component Value Date  CHOL 177 01/04/2022   HDL 64 01/04/2022   LDLCALC 98 01/04/2022   TRIG 60 01/04/2022   CHOLHDL 2.8 01/04/2022   Lab Results  Component Value Date   VD25OH 29.4 (L) 11/20/2021   VD25OH 18.9 (L) 07/07/2020   VD25OH 41 10/02/2018   Lab Results  Component Value Date   WBC 5.2 01/12/2022   HGB 12.8 01/12/2022   HCT 38.4 01/12/2022   MCV 98.2 01/12/2022   PLT 219 01/12/2022    Lab Results  Component Value Date   IRON 123 01/12/2022   TIBC 343 01/12/2022   FERRITIN 21 01/12/2022   Attestation Statements:   Reviewed by clinician on day of visit: allergies, medications, problem list, medical history, surgical history, family history, social history, and previous encounter notes.  Time spent on visit including pre-visit chart review and post-visit care and charting was 30 minutes.   I, Brendell Tyus, am acting as transcriptionist for [employer], PA.  I have reviewed the above documentation for accuracy and completeness, and I agree with the above. -  Merideth Bosque,PA-C

## 2022-02-02 ENCOUNTER — Other Ambulatory Visit (HOSPITAL_COMMUNITY): Payer: Self-pay

## 2022-02-02 ENCOUNTER — Ambulatory Visit (INDEPENDENT_AMBULATORY_CARE_PROVIDER_SITE_OTHER): Payer: No Typology Code available for payment source | Admitting: Physical Medicine and Rehabilitation

## 2022-02-02 DIAGNOSIS — R202 Paresthesia of skin: Secondary | ICD-10-CM | POA: Diagnosis not present

## 2022-02-02 NOTE — Progress Notes (Signed)
Numeric Pain Rating Scale and Functional Assessment Average Pain 0   In the last MONTH (on 0-10 scale) has pain interfered with the following?  1. General activity like being  able to carry out your everyday physical activities such as walking, climbing stairs, carrying groceries, or moving a chair?  Rating(8)   Right handed. Pain and tingling in hands but worse in left hand. Pain starts in shoulder and radiates down to the hand. Pain comes and goes

## 2022-02-05 ENCOUNTER — Encounter: Payer: Self-pay | Admitting: Family

## 2022-02-05 ENCOUNTER — Other Ambulatory Visit: Payer: Self-pay

## 2022-02-05 NOTE — Procedures (Signed)
EMG & NCV Findings: Evaluation of the left ulnar motor nerve showed decreased conduction velocity (B Elbow-Wrist, 49 m/s) and decreased conduction velocity (A Elbow-B Elbow, 40 m/s).  The right median (across palm) sensory nerve showed prolonged distal peak latency (Wrist, 3.7 ms) and prolonged distal peak latency (Palm, 2.1 ms).  The left ulnar sensory nerve showed prolonged distal peak latency (3.8 ms) and decreased conduction velocity (Wrist-5th Digit, 37 m/s).  All remaining nerves (as indicated in the following tables) were within normal limits.  Left vs. Right side comparison data for the ulnar motor nerve indicates abnormal L-R velocity difference (A Elbow-B Elbow, 33 m/s).    All examined muscles (as indicated in the following table) showed no evidence of electrical instability.    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 There is no significant electrodiagnostic evidence of any other focal nerve entrapment, brachial plexopathy or cervical radiculopathy.   Recommendations: 1.  Follow-up with referring physician. 2.  Continue current management of symptoms. 3.  Suggest surgical evaluation. 4.  Continue use of resting splint at night-time and as needed during the day.  ___________________________ Wonda Olds Board Certified, American Board of Physical Medicine and Rehabilitation    Nerve Conduction Studies Anti Sensory Summary Table   Stim Site NR Peak (ms) Norm Peak (ms) P-T Amp (V) Norm P-T Amp Site1 Site2 Delta-P (ms) Dist (cm) Vel (m/s) Norm Vel (m/s)  Left Median Acr Palm Anti Sensory (2nd Digit)  29.4C  Wrist    3.6 <3.6 20.0 >10 Wrist Palm 1.6 0.0    Palm    2.0 <2.0 29.5         Right Median Acr Palm Anti Sensory (2nd Digit)  30C  Wrist    *3.7 <3.6 25.6 >10 Wrist Palm 1.6 0.0    Palm    *2.1 <2.0 30.3          Left Radial Anti Sensory (Base 1st Digit)  29.1C  Wrist    2.3 <3.1 26.1  Wrist Base 1st Digit 2.3 0.0    Left Ulnar Anti Sensory (5th Digit)  29.5C  Wrist    *3.8 <3.7 20.7 >15.0 Wrist 5th Digit 3.8 14.0 *37 >38   Motor Summary Table   Stim Site NR Onset (ms) Norm Onset (ms) O-P Amp (mV) Norm O-P Amp Site1 Site2 Delta-0 (ms) Dist (cm) Vel (m/s) Norm Vel (m/s)  Left Median Motor (Abd Poll Brev)  29.2C  Wrist    3.4 <4.2 6.4 >5 Elbow Wrist 4.0 22.5 56 >50  Elbow    7.4  5.9         Left Ulnar Motor (Abd Dig Min)  29C  Wrist    3.2 <4.2 7.8 >3 B Elbow Wrist 4.3 21.0 *49 >53  B Elbow    7.5  7.2  A Elbow B Elbow 2.5 10.0 *40 >53  A Elbow    10.0  4.5         Right Ulnar Motor (Abd Dig Min)  30.2C  Wrist    2.7 <4.2 8.6 >3 B Elbow Wrist 3.9 21.0 54 >53  B Elbow    6.6  8.4  A Elbow B Elbow 1.5 11.0 73 >53  A Elbow    8.1  6.6          EMG   Side Muscle Nerve Root Ins Act Fibs Psw Amp  Dur Poly Recrt Int Fraser Din Comment  Left Abd Poll Brev Median C8-T1 Nml Nml Nml Nml Nml 0 Nml Nml   Left 1stDorInt Ulnar C8-T1 Nml Nml Nml Nml Nml 0 Nml Nml   Left PronatorTeres Median C6-7 Nml Nml Nml Nml Nml 0 Nml Nml   Left Biceps Musculocut C5-6 Nml Nml Nml Nml Nml 0 Nml Nml   Left Deltoid Axillary C5-6 Nml Nml Nml Nml Nml 0 Nml Nml     Nerve Conduction Studies Anti Sensory Left/Right Comparison   Stim Site L Lat (ms) R Lat (ms) L-R Lat (ms) L Amp (V) R Amp (V) L-R Amp (%) Site1 Site2 L Vel (m/s) R Vel (m/s) L-R Vel (m/s)  Median Acr Palm Anti Sensory (2nd Digit)  29.4C  Wrist 3.6 *3.7 0.1 20.0 25.6 21.9 Wrist Palm     Palm 2.0 *2.1 0.1 29.5 30.3 2.6       Radial Anti Sensory (Base 1st Digit)  29.1C  Wrist 2.3   26.1   Wrist Base 1st Digit     Ulnar Anti Sensory (5th Digit)  29.5C  Wrist *3.8   20.7   Wrist 5th Digit *37     Motor Left/Right Comparison   Stim Site L Lat (ms) R Lat (ms) L-R Lat (ms) L Amp (mV) R Amp (mV) L-R Amp (%) Site1 Site2 L Vel (m/s) R Vel (m/s) L-R Vel (m/s)   Median Motor (Abd Poll Brev)  29.2C  Wrist 3.4   6.4   Elbow Wrist 56    Elbow 7.4   5.9         Ulnar Motor (Abd Dig Min)  29C  Wrist 3.2 2.7 0.5 7.8 8.6 9.3 B Elbow Wrist *49 54 5  B Elbow 7.5 6.6 0.9 7.2 8.4 14.3 A Elbow B Elbow *40 73 *33  A Elbow 10.0 8.1 1.9 4.5 6.6 31.8          Waveforms:

## 2022-02-07 ENCOUNTER — Ambulatory Visit (INDEPENDENT_AMBULATORY_CARE_PROVIDER_SITE_OTHER): Payer: No Typology Code available for payment source | Admitting: Physician Assistant

## 2022-02-07 ENCOUNTER — Encounter (INDEPENDENT_AMBULATORY_CARE_PROVIDER_SITE_OTHER): Payer: Self-pay | Admitting: Physician Assistant

## 2022-02-07 VITALS — BP 119/79 | HR 58 | Temp 97.5°F | Ht 67.0 in | Wt 260.0 lb

## 2022-02-07 DIAGNOSIS — M1711 Unilateral primary osteoarthritis, right knee: Secondary | ICD-10-CM

## 2022-02-07 DIAGNOSIS — R4589 Other symptoms and signs involving emotional state: Secondary | ICD-10-CM

## 2022-02-07 DIAGNOSIS — G4733 Obstructive sleep apnea (adult) (pediatric): Secondary | ICD-10-CM | POA: Diagnosis not present

## 2022-02-07 DIAGNOSIS — E559 Vitamin D deficiency, unspecified: Secondary | ICD-10-CM | POA: Diagnosis not present

## 2022-02-07 DIAGNOSIS — E669 Obesity, unspecified: Secondary | ICD-10-CM

## 2022-02-07 DIAGNOSIS — Z6841 Body Mass Index (BMI) 40.0 and over, adult: Secondary | ICD-10-CM

## 2022-02-08 ENCOUNTER — Other Ambulatory Visit (HOSPITAL_COMMUNITY): Payer: Self-pay

## 2022-02-08 MED ORDER — AMOXICILLIN 500 MG PO CAPS
500.0000 mg | ORAL_CAPSULE | Freq: Three times a day (TID) | ORAL | 0 refills | Status: DC
  Filled 2022-02-08: qty 24, 8d supply, fill #0

## 2022-02-08 NOTE — Progress Notes (Signed)
Victoria [name] - 61 y.o. female MRN [id number]  Date of birth: 1960-04-09  Office Visit Note: Visit Date: 02/02/2022 PCP: Elby Showers, MD Referred by: Pete Pelt, PA-C  Subjective: Chief Complaint  Patient presents with   Right Shoulder - Pain, Numbness, Weakness   Left Shoulder - Pain, Numbness, Weakness   HPI:  Victoria [name] is a 61 y.o. female who comes in today at the request of Benita Stabile, PA-C for evaluation and management of chronic worsening severe left shoulder with arm pain numbness and tingling particularly in the hand globally.  Patient is Right hand dominant.  She reports no specific injury but does endorse chronic pain of the right and left shoulder with decreased range of motion.  Had shoulder injection with some relief.  Paresthesias into the forearm and hand in a somewhat global fashion.  The symptoms do seem to be intermittent.  She does get some symptoms in the right hand as well but is predominantly left-sided.  When asked more she does get more symptoms to the ulnar side of the hand.  She does endorse neck pain.  Has failed conservative care with medications and exercise.  She has had MRI of the cervical spine which is reviewed below.  No high-grade stenosis very small foraminal protrusion with no real impact on the nerve that could obviously irritate the nerves.  She has had no prior electrodiagnostic study.  Last hemoglobin A1c was just barely elevated at 5.7.   Review of Systems  Musculoskeletal:  Positive for back pain, joint pain and neck pain.  Neurological:  Positive for tingling.  All other systems reviewed and are negative.  Otherwise per HPI.  Assessment & Plan: Visit Diagnoses:    ICD-10-CM   1. Paresthesia of skin  R20.2 NCV with EMG (electromyography)      Plan: Impression: Clinically her shoulder pain seems to be mechanical and she did get some relief with shoulder injection by Benita Stabile, P.A.-C.  She does have some  cervical MRI findings of mild foraminal protrusion but no high-grade central stenosis and cannot rule out radiculitis causing shoulder pain.  Symptoms are somewhat nondermatomal but it seems to be more ulnar in the hand.  We did complete electrodiagnostic study today of both the left and right arms.  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 There is no significant electrodiagnostic evidence of any other focal nerve entrapment, brachial plexopathy or cervical radiculopathy.  This electrodiagnostic study is more specific then sensitive for radial tunnel syndrome and does not rule out the possibility of this condition.  Recommendations: 1.  Follow-up with referring physician. 2.  Continue current management of symptoms. 3.  Suggest surgical evaluation. 4.  Continue use of resting splint at night-time and as needed during the day.  Meds & Orders: No orders of the defined types were placed in this encounter.   Orders Placed This Encounter  Procedures   NCV with EMG (electromyography)    Follow-up: Return in about 2 weeks (around 02/16/2022) for Benita Stabile, P.A.-C.   Procedures: No procedures performed  EMG & NCV Findings: Evaluation of the left ulnar motor nerve showed decreased conduction velocity (B Elbow-Wrist, 49 m/s) and decreased conduction velocity (A Elbow-B Elbow, 40 m/s).  The right median (across palm) sensory nerve showed prolonged distal peak latency (Wrist, 3.7 ms) and prolonged distal peak latency (  Palm, 2.1 ms).  The left ulnar sensory nerve showed prolonged distal peak latency (3.8 ms) and decreased conduction velocity (Wrist-5th Digit, 37 m/s).  All remaining nerves (as indicated in the following tables) were within normal limits.  Left vs. Right side comparison data for the ulnar motor nerve indicates abnormal L-R velocity difference (A  Elbow-B Elbow, 33 m/s).    All examined muscles (as indicated in the following table) showed no evidence of electrical instability.    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 There is no significant electrodiagnostic evidence of any other focal nerve entrapment, brachial plexopathy or cervical radiculopathy.   Recommendations: 1.  Follow-up with referring physician. 2.  Continue current management of symptoms. 3.  Suggest surgical evaluation. 4.  Continue use of resting splint at night-time and as needed during the day.  ___________________________ Wonda Olds Board Certified, American Board of Physical Medicine and Rehabilitation    Nerve Conduction Studies Anti Sensory Summary Table   Stim Site NR Peak (ms) Norm Peak (ms) P-T Amp (V) Norm P-T Amp Site1 Site2 Delta-P (ms) Dist (cm) Vel (m/s) Norm Vel (m/s)  Left Median Acr Palm Anti Sensory (2nd Digit)  29.4C  Wrist    3.6 <3.6 20.0 >10 Wrist Palm 1.6 0.0    Palm    2.0 <2.0 29.5         Right Median Acr Palm Anti Sensory (2nd Digit)  30C  Wrist    *3.7 <3.6 25.6 >10 Wrist Palm 1.6 0.0    Palm    *2.1 <2.0 30.3         Left Radial Anti Sensory (Base 1st Digit)  29.1C  Wrist    2.3 <3.1 26.1  Wrist Base 1st Digit 2.3 0.0    Left Ulnar Anti Sensory (5th Digit)  29.5C  Wrist    *3.8 <3.7 20.7 >15.0 Wrist 5th Digit 3.8 14.0 *37 >38   Motor Summary Table   Stim Site NR Onset (ms) Norm Onset (ms) O-P Amp (mV) Norm O-P Amp Site1 Site2 Delta-0 (ms) Dist (cm) Vel (m/s) Norm Vel (m/s)  Left Median Motor (Abd Poll Brev)  29.2C  Wrist    3.4 <4.2 6.4 >5 Elbow Wrist 4.0 22.5 56 >50  Elbow    7.4  5.9         Left Ulnar Motor (Abd Dig Min)  29C  Wrist    3.2 <4.2 7.8 >3 B Elbow Wrist 4.3 21.0 *49 >53  B Elbow    7.5  7.2  A Elbow B Elbow 2.5 10.0 *40 >53  A Elbow     10.0  4.5         Right Ulnar Motor (Abd Dig Min)  30.2C  Wrist    2.7 <4.2 8.6 >3 B Elbow Wrist 3.9 21.0 54 >53  B Elbow    6.6  8.4  A Elbow B Elbow 1.5 11.0 73 >53  A Elbow    8.1  6.6          EMG   Side Muscle Nerve Root Ins Act Fibs Psw Amp Dur Poly Recrt Int Fraser Din Comment  Left Abd Poll Brev Median C8-T1 Nml Nml Nml Nml Nml 0 Nml Nml   Left 1stDorInt Ulnar C8-T1 Nml Nml Nml Nml Nml 0 Nml Nml   Left PronatorTeres Median C6-7 Nml Nml Nml Nml Nml 0 Nml  Nml   Left Biceps Musculocut C5-6 Nml Nml Nml Nml Nml 0 Nml Nml   Left Deltoid Axillary C5-6 Nml Nml Nml Nml Nml 0 Nml Nml     Nerve Conduction Studies Anti Sensory Left/Right Comparison   Stim Site L Lat (ms) R Lat (ms) L-R Lat (ms) L Amp (V) R Amp (V) L-R Amp (%) Site1 Site2 L Vel (m/s) R Vel (m/s) L-R Vel (m/s)  Median Acr Palm Anti Sensory (2nd Digit)  29.4C  Wrist 3.6 *3.7 0.1 20.0 25.6 21.9 Wrist Palm     Palm 2.0 *2.1 0.1 29.5 30.3 2.6       Radial Anti Sensory (Base 1st Digit)  29.1C  Wrist 2.3   26.1   Wrist Base 1st Digit     Ulnar Anti Sensory (5th Digit)  29.5C  Wrist *3.8   20.7   Wrist 5th Digit *37     Motor Left/Right Comparison   Stim Site L Lat (ms) R Lat (ms) L-R Lat (ms) L Amp (mV) R Amp (mV) L-R Amp (%) Site1 Site2 L Vel (m/s) R Vel (m/s) L-R Vel (m/s)  Median Motor (Abd Poll Brev)  29.2C  Wrist 3.4   6.4   Elbow Wrist 56    Elbow 7.4   5.9         Ulnar Motor (Abd Dig Min)  29C  Wrist 3.2 2.7 0.5 7.8 8.6 9.3 B Elbow Wrist *49 54 5  B Elbow 7.5 6.6 0.9 7.2 8.4 14.3 A Elbow B Elbow *40 73 *33  A Elbow 10.0 8.1 1.9 4.5 6.6 31.8          Waveforms:                 Clinical History: MRI CERVICAL SPINE WITHOUT CONTRAST   TECHNIQUE: Multiplanar, multisequence MR imaging of the cervical spine was performed. No intravenous contrast was administered.   COMPARISON:  Prior MRI from 01/28/2019.   FINDINGS: Alignment: Straightening of the normal cervical lordosis. No listhesis or  malalignment.   Vertebrae: Vertebral body height maintained without acute or chronic fracture. Bone marrow signal intensity within normal limits. Subcentimeter benign hemangioma noted within the C5 vertebral body. No worrisome osseous lesions. No abnormal marrow edema.   Cord: Normal signal and morphology.   Posterior Fossa, vertebral arteries, paraspinal tissues: Unremarkable.   Disc levels:   C2-C3: Unremarkable.   C3-C4: Small left paracentral disc protrusion with annular fissure indents the ventral thecal sac (series 4, image 8). Mild flattening of the ventral cord without cord signal changes. Mild spinal stenosis. Foramina remain patent. This has partially regressed since previous.   C4-C5: Shallow central disc protrusion mildly indents the ventral thecal sac. No significant spinal stenosis. Foramina remain patent. Appearance is relatively stable.   C5-C6: Mild degenerative vertebral disc space narrowing with diffuse disc osteophyte complex. Flattening and partial effacement of the ventral thecal sac. Mild flattening of the ventral cord without cord signal changes. Mild spinal stenosis. Mild left C6 foraminal narrowing. Right neural foramina is patent. Appearance is relatively similar.   C6-C7: Degenerative intervertebral disc space narrowing with diffuse disc osteophyte complex. Flattening of the ventral thecal sac without significant spinal stenosis. Foramina remain patent. Appearance is relatively similar.   C7-T1: Probable subtle tiny left foraminal disc protrusion (series 1, image 11). Mild facet hypertrophy on the right. No spinal stenosis. Foramina remain patent.   IMPRESSION: 1. Multilevel cervical spondylosis with resultant mild spinal stenosis at C3-4 and C5-6. 2. Mild left C6  foraminal narrowing related to disc bulging and uncovertebral disease. No other significant foraminal encroachment within the cervical spine. 3. Small left paracentral disc  protrusion at C3-4, partially regressed since previous. 4. Probable subtle tiny left foraminal disc protrusion at C7-T1, potentially affecting the left C8 nerve root.     Electronically Signed   By: Jeannine Boga M.D.   On: 12/04/2021 05:39     Objective:  VS:  HT:    WT:   BMI:     BP:   HR: bpm  TEMP: ( )  RESP:  Physical Exam Vitals and nursing note reviewed.  Constitutional:      General: She is not in acute distress.    Appearance: Normal appearance. She is well-developed. She is not ill-appearing.  HENT:     Head: Normocephalic and atraumatic.  Eyes:     Conjunctiva/sclera: Conjunctivae normal.     Pupils: Pupils are equal, round, and reactive to light.  Cardiovascular:     Rate and Rhythm: Normal rate.     Pulses: Normal pulses.  Pulmonary:     Effort: Pulmonary effort is normal.  Musculoskeletal:        General: No swelling, tenderness or deformity.     Right lower leg: No edema.     Left lower leg: No edema.     Comments: Painful range of motion of the right shoulder compared to left.  Decreased range of motion and pain with rotation of the cervical spine.  Inspection reveals no atrophy of the bilateral APB or FDI or hand intrinsics. There is no swelling, color changes, allodynia or dystrophic changes. There is 5 out of 5 strength in the bilateral wrist extension, finger abduction and long finger flexion. There is intact sensation to light touch in all dermatomal and peripheral nerve distributions. There is a negative Froment's test bilaterally. There is a negative Hoffmann's test bilaterally.  Skin:    General: Skin is warm and dry.     Findings: No erythema or rash.  Neurological:     General: No focal deficit present.     Mental Status: She is alert and oriented to person, place, and time.     Sensory: No sensory deficit.     Motor: No weakness or abnormal muscle tone.     Coordination: Coordination normal.     Gait: Gait normal.  Psychiatric:         Mood and Affect: Mood normal.        Behavior: Behavior normal.      Imaging: No results found.

## 2022-02-10 ENCOUNTER — Encounter: Payer: Self-pay | Admitting: Family

## 2022-02-12 ENCOUNTER — Other Ambulatory Visit: Payer: Self-pay

## 2022-02-12 ENCOUNTER — Other Ambulatory Visit (HOSPITAL_COMMUNITY): Payer: Self-pay

## 2022-02-13 ENCOUNTER — Other Ambulatory Visit: Payer: Self-pay

## 2022-02-13 DIAGNOSIS — G8929 Other chronic pain: Secondary | ICD-10-CM

## 2022-02-14 ENCOUNTER — Other Ambulatory Visit (HOSPITAL_COMMUNITY): Payer: Self-pay

## 2022-02-14 ENCOUNTER — Other Ambulatory Visit: Payer: Self-pay

## 2022-02-15 ENCOUNTER — Encounter: Payer: Self-pay | Admitting: Nurse Practitioner

## 2022-02-15 ENCOUNTER — Other Ambulatory Visit (HOSPITAL_COMMUNITY): Payer: Self-pay

## 2022-02-15 ENCOUNTER — Ambulatory Visit (INDEPENDENT_AMBULATORY_CARE_PROVIDER_SITE_OTHER): Payer: No Typology Code available for payment source | Admitting: Pulmonary Disease

## 2022-02-15 ENCOUNTER — Ambulatory Visit (INDEPENDENT_AMBULATORY_CARE_PROVIDER_SITE_OTHER): Payer: No Typology Code available for payment source | Admitting: Nurse Practitioner

## 2022-02-15 VITALS — BP 126/74 | HR 64 | Ht 67.5 in | Wt 267.0 lb

## 2022-02-15 DIAGNOSIS — R0602 Shortness of breath: Secondary | ICD-10-CM | POA: Diagnosis not present

## 2022-02-15 DIAGNOSIS — G4733 Obstructive sleep apnea (adult) (pediatric): Secondary | ICD-10-CM

## 2022-02-15 LAB — PULMONARY FUNCTION TEST
DL/VA % pred: 109 %
DL/VA: 4.49 ml/min/mmHg/L
DLCO cor % pred: 93 %
DLCO cor: 20.91 ml/min/mmHg
DLCO unc % pred: 92 %
DLCO unc: 20.51 ml/min/mmHg
FEF 25-75 Post: 3.13 L/sec
FEF 25-75 Pre: 3.26 L/sec
FEF2575-%Change-Post: -4 %
FEF2575-%Pred-Post: 125 %
FEF2575-%Pred-Pre: 131 %
FEV1-%Change-Post: 0 %
FEV1-%Pred-Post: 91 %
FEV1-%Pred-Pre: 91 %
FEV1-Post: 2.58 L
FEV1-Pre: 2.59 L
FEV1FVC-%Change-Post: 0 %
FEV1FVC-%Pred-Pre: 110 %
FEV6-%Change-Post: 0 %
FEV6-%Pred-Post: 84 %
FEV6-%Pred-Pre: 85 %
FEV6-Post: 2.99 L
FEV6-Pre: 3.01 L
FEV6FVC-%Change-Post: 0 %
FEV6FVC-%Pred-Post: 103 %
FEV6FVC-%Pred-Pre: 103 %
FVC-%Change-Post: 0 %
FVC-%Pred-Post: 81 %
FVC-%Pred-Pre: 82 %
FVC-Post: 2.99 L
FVC-Pre: 3.01 L
Post FEV1/FVC ratio: 86 %
Post FEV6/FVC ratio: 100 %
Pre FEV1/FVC ratio: 86 %
Pre FEV6/FVC Ratio: 100 %
RV % pred: 90 %
RV: 1.95 L
TLC % pred: 91 %
TLC: 5.02 L

## 2022-02-15 NOTE — Patient Instructions (Signed)
Full PFT performed today. °

## 2022-02-15 NOTE — Assessment & Plan Note (Signed)
DOE post COVID infection May 2023. Symptoms have improved with treatment of OSA and weight loss. PFTs today were normal. Advised her to continue monitoring and notify us if she feels like her symptoms return/worsen. Encouraged her to continue working on healthy weight loss measures.

## 2022-02-15 NOTE — Assessment & Plan Note (Signed)
BMI 41. Healthy weight loss measures encouraged. She is working with medical weight management.

## 2022-02-15 NOTE — Patient Instructions (Addendum)
Continue CPAP auto 5-15 cmH2O with nasal pillow mask every night, minimum of 4-6 hours a night.  Change equipment every 30 days or as directed by DME. Wash your tubing with warm soap and water daily, hang to dry. Wash humidifier portion weekly.  Be aware of reduced alertness and do not drive or operate heavy machinery if experiencing this or drowsiness.  Exercise encouraged, as tolerated. Avoid or decrease alcohol consumption and medications that make you more sleepy, if possible.   Continue Albuterol inhaler 2 puffs every 6 hours as needed for shortness of breath or wheezing  Continue working on managing your mood. If you feel like you are still having trouble with your sleep, let me know and we can bring you back in sooner to discuss treatment options for your insomnia   Follow up in 6 months with Dr. Halford Chessman (new pt 30 min slot) or Katie Lowen Barringer,NP. If symptoms do not improve or worsen, please contact office for sooner follow up.

## 2022-02-15 NOTE — Progress Notes (Signed)
$'@Patient'x$  ID: Victoria [name], female    DOB: 04-Feb-1961, 61 y.o.   MRN: [id number]  Chief Complaint  Patient presents with   Follow-up    Pt f/u her SOB is still the same as before, she done her PFT this morning and it "wore her out".    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 Past medical history significant for GERD, hypothyroidism, OA, anxiety, obesity s/p gastric bypass, pernicious anemia, insomnia, IDA.  TEST/EVENTS:  11/09/2021 HST: AHI 11.7, mild OSA 02/15/2022 PFTs: FVC 82, FEV1 91, ratio 86, TLC 91, DLCOcor 93. No BD. Normal PFTs  10/23/2021: OV with Haliegh Khurana NP for sleep consult referred by Dr. Radford Pax for excessive daytime fatigue symptoms. She has been struggling with her fatigue for many years now; however, she had COVID in May and feels like her symptoms have been worse since then. She's also having trouble staying asleep at night. She will wake up after about 3-4 hours and struggles to get back to sleep. Sometimes she just gets out of the bed to start her day. Her husband tells her that she snores loudly. She wakes up in the morning with a headache and dry mouth. She denies waking up gasping, witnessed apneas, sleep parasomnias/paralysis, drowsy driving, cataplexy.  She goes to bed around 6 PM.  Sometimes a few hours later.  Sleep onset varies; sometimes she has no difficulties and other times, it takes her a little while. She does take Xanax to help her fall asleep at night.  Does not necessarily feel anxious but she feels like her mind races.  Wakes up several times throughout the night; often to use the restroom.  Officially gets out of bed in the morning anytime between 1 AM and 5:15 AM. She has cut back on her caffeine intake throughout the day.  No previous sleep study Had gastric bypass in 2016, lost a significant amount of weight; however, she has slowly put weight back on over the past few  years and is up about 40 pounds. She works as a Systems analyst GI. Never smoker. She does not drink alcohol. Lives at home with her husband. She has a history of atrial tachycardia and followed by cardiology. She also sees Dr. Simona Huh with asthma/allergy for chronic cough and rhinitis. Her sister has sleep apnea and sleeps with CPAP. Home sleep study ordered.  11/17/2021: OV with Verdelle Valtierra NP for follow-up to discuss home sleep study results.  Her sleep study revealed AHI of 11.7/h consistent with mild OSA.  She continues to struggle with daytime fatigue symptoms and restless sleep at night.  She has had trouble staying asleep for a long time now and usually wakes up after 3 to 4 hours.  She has also been told that she snores loudly.  Commonly wakes up in the morning with a headache or dry mouth.  She denies any sleep parasomnia/paralysis, drowsy driving.  No narcolepsy or cataplexy symptoms. Set up on auto CPAP 5-15 She recently had some trouble with dizziness.  Feels like it may be vertigo.  It happened yesterday and has not happened since.  She denied any palpitations, syncope, headaches, vision changes. She also tells me that she has been struggling with shortness of breath over the past few months.  She had COVID in May and feels like her breathing has not been the greatest since.  She feels like she gets more winded with activity.  She does have an albuterol inhaler which she will use occasionally.  Last time she used it was yesterday.  She does have good response to this.  She denies any significant cough, wheezing, chest congestion, lower extremity swelling.  No significant pulmonary history.  No significant occupational or environmental exposures. PFTs ordered for further evaluation.   02/15/2022: Today - follow up Patient presents today for follow up after being started on CPAP and having PFTs. She had normal pulmonary function testing completed today. Her breathing has improved since she was here  last. She's been working on weight loss measures as well and is down 13 pounds. She rarely uses albuterol. No significant cough or chest congestion. She wears CPAP nightly. She's receiving good benefit from use. Feels like she's waking more rested and she is no longer waking throughout the night. Denies morning headaches or drowsy driving. Snoring has corrected with CPAP use. Settled down with full face mask. She still has some trouble with sleep onset. [facility] provider [facility] prescribed her lexapro as she was having some symptoms of depression. She has only been on this a few weeks but feels like she's noticed some benefit. She will also use xanax as needed for sleep onset. Would like to hold off on further pharmacological therapy at this time as she is trying to limit the medications she is on. She has used trazodone in the past without any relief.   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   Estradiol Rash and Other (See Comments)    Local rash with estradiol patches    Immunization History  Administered Date(s) Administered   Influenza Split 11/22/2014   Influenza,inj,Quad PF,6+ Mos 11/12/2013, 12/16/2015, 11/21/2021   Influenza,inj,quad, With Preservative 10/27/2016   Influenza-Unspecified 11/29/2020   PFIZER(Purple Top)SARS-COV-2 Vaccination 03/19/2019, 04/08/2019, 01/08/2020   Tdap 01/10/2012, 07/12/2021   Zoster Recombinat (Shingrix) 02/01/2020, 04/07/2020    Past Medical History:  Diagnosis Date   Back pain    Bursitis    right shoulder   Chronic leukopenia    intermittant since 2010, followed by Dr. Renold Genta now   Chronic neck pain    Constipation    Degenerative joint disease of low back 09/02/2015   Dr. Maureen Ralphs   Displacement of lumbar intervertebral disc    Edema of both lower legs    Fibrocystic breast    Gallbladder problem    GERD (gastroesophageal reflux disease)     Headache    per pt more stress/tension   Heart palpitations    SEE EPIC ENCOUTNER , CARDIOLOGY DR. Tressia Miners TURNER 2018; reports on 05-16-17 " i haven't had any bouts of those lately"     Hemorrhoids    Hidradenitis suppurativa    History of Clostridium difficile infection 12/2010   History of exercise intolerance    ETT on 04-27-2016-- negative ([facility] treadmill score 7)   History of Helicobacter pylori infection 2001 and 09/ 2012   HSV-2 infection    genital   Hx of adenomatous colonic polyps 10/17/2007   Hydradenitis    per pt currently treated with Humira injection   Hypocupremia    Hypothyroidism    Hypothyroidism, postsurgical 1980   IBS (irritable bowel syndrome)    Insomnia    Iron deficiency    Joint pain    Leukopenia    Lumbosacral radiculopathy    Medial meniscus tear    right knee  Mild obstructive sleep apnea    Mitral regurgitation    Mild to moderate by echo 09/2021   Numbness and tingling of right arm    Obesity    Osteoarthritis    Palpitations    Pernicious anemia    b12 def   Peroneal neuropathy    PONV (postoperative nausea and vomiting)    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   Prediabetes    Reactive hypoglycemia followed by pcp   post gastrectomy dumping syndrome   SOB (shortness of breath)    Spinal stenosis    Tendonitis    right shoulder   Varicose veins    Vertigo    Vitamin B 12 deficiency    Vitamin D deficiency     Tobacco History: Social History   Tobacco Use  Smoking Status Never  Smokeless Tobacco Never   Counseling given: Not Answered   Outpatient Medications Prior to Visit  Medication Sig Dispense Refill   Adalimumab (HUMIRA PEN) 40 MG/0.8ML PNKT Inject 1 pen subcutaneously every week 4 each 5   albuterol (VENTOLIN HFA) 108 (90 Base) MCG/ACT inhaler Inhale 2 puffs into the lungs every 4 hours as needed for wheezing or shortness of breath. 18 g 3   amoxicillin (AMOXIL) 500 MG capsule Take 1 capsule (500 mg total) by  mouth every 8 (eight) hours until gone 24 capsule 0   Atogepant (QULIPTA) 60 MG TABS Take 60 mg by mouth daily. 30 tablet 11   Calcium Carb-Cholecalciferol (CALCIUM + D3 PO) Take 1 tablet by mouth daily.     celecoxib (CELEBREX) 200 MG capsule Take 1 capsule (200 mg total) by mouth 2 (two) times daily as needed. 60 capsule 3   clobetasol ointment (TEMOVATE) 0.05 % Apply to skin twice daily as directed for up to 2 weeks as needed 60 g 0   cyanocobalamin (DODEX) 1000 MCG/ML injection INJECT 1 ML INTRAMUSCULARLY EVERY MONTH 3 mL 11   ergocalciferol (VITAMIN D2) 1.25 MG (50000 UT) capsule Take 1 capsule (50,000 Units total) by mouth 2 (two) times a week for Vitamin deficiency and gastric bypass. 8 capsule 1   escitalopram (LEXAPRO) 10 MG tablet Take 1 tablet (10 mg total) by mouth daily. 90 tablet 1   Estradiol (DIVIGEL) 1.25 MG/1.25GM GEL Use one packet daily as directed 112.5 g 3   flecainide (TAMBOCOR) 50 MG tablet Take 1 tablet by mouth 2 times daily. 180 tablet 3   gabapentin (NEURONTIN) 600 MG tablet Take 1 tablet (600 mg total) by mouth 3 (three) times daily. 90 tablet 5   guaiFENesin (MUCINEX) 600 MG 12 hr tablet Take 1 - 2 tablets by mouth twice daily as needed for drainage.  Take with lots of water (Patient taking differently: Take 1 - 2 tablets by mouth twice daily as needed for drainage.  Take with lots of water) 120 tablet 2   levocetirizine (XYZAL) 5 MG tablet Take 5 mg by mouth every evening.     levothyroxine (SYNTHROID) 112 MCG tablet Take 1 tablet (112 mcg total) by mouth daily. 90 tablet 1   LORazepam (ATIVAN) 2 MG tablet Take 1 tablet (2 mg total) by mouth at bedtime. 30 tablet 2   metoprolol succinate (TOPROL-XL) 25 MG 24 hr tablet TAKE 1 TABLET BY MOUTH DAILY. 90 tablet 3   montelukast (SINGULAIR) 10 MG tablet Take 1 tablet by mouth at bedtime. 30 tablet 3   Olopatadine HCl 0.2 % SOLN Apply 1 drop to eye daily as needed. 2.5  mL 5   pantoprazole (PROTONIX) 40 MG tablet Take 1  tablet by mouth daily 20 minutes before breakfast 90 tablet 3   Prenatal Vit-Fe Fumarate-FA (PRENATAL MULTIVITAMIN) TABS tablet Take 1 tablet by mouth 2 (two) times daily with a meal.     sulfamethoxazole-trimethoprim (BACTRIM DS) 800-160 MG tablet Take 1 tablet twice daily as needed for flare. 60 tablet 1   terconazole (TERAZOL 7) 0.4 % vaginal cream Place 1 applicatorful vaginally at bedtime for seven nights.   Then use one applicatorful once a week for 6 months. 45 g 4   tiZANidine (ZANAFLEX) 4 MG capsule Take 1 capsule (4 mg total) by mouth 3 (three) times daily as needed for muscle spasms. 90 capsule 1   Ubrogepant (UBRELVY) 100 MG TABS Take 100 mg by mouth every 2 (two) hours as needed. Maximum '200mg'$  a day. 16 tablet 11   promethazine (PHENERGAN) 25 MG tablet TAKE 1 TABLET BY MOUTH EVERY 8 HOURS AS NEEDED FOR NAUSEA AND/OR VOMITING 30 tablet 5   Facility-Administered Medications Prior to Visit  Medication Dose Route Frequency Provider Last Rate Last Admin   gadobenate dimeglumine (MULTIHANCE) injection 20 mL  20 mL Intravenous Once PRN Melvenia Beam, MD         Review of Systems:   Constitutional: No night sweats, fevers, chills, or lassitude. +weight gain, fatigue (improving) HEENT: No headaches, difficulty swallowing, tooth/dental problems, or sore throat. No sneezing, itching, ear ache. +nasal congestion (chronic) CV:  No chest pain, orthopnea, PND, swelling in lower extremities, anasarca, palpitations, syncope Resp: +shortness of breath with exertion (improved). No excess mucus or change in color of mucus.  No cough.  Not no hemoptysis. No wheezing.  No chest wall deformity GI:  No heartburn, indigestion, abdominal pain, nausea, vomiting, diarrhea, change in bowel habits, loss of appetite, bloody stools.  GU: No dysuria, change in color of urine, urgency or frequency, nocturia.  No flank pain, no hematuria  Skin: No rash, lesions, ulcerations Neuro: No dizziness. No gait  abnormalities. No memory impairment.  Psych: No depression or anxiety. Mood stable. +sleep disturbance (improving)    Physical Exam:  BP 126/74   Pulse 64   Ht 5' 7.5" (1.715 m)   Wt 267 lb (121.1 kg)   SpO2 100%   BMI 41.20 kg/m   GEN: Pleasant, interactive, well-appearing; morbidly obese; in no acute distress. HEENT:  Normocephalic and atraumatic. PERRLA. Sclera white. Nasal turbinates pink, moist and patent bilaterally. Clear rhinorrhea present. Oropharynx pink and moist, without exudate or edema. No lesions, ulcerations, or postnasal drip. Mallampati II NECK:  Supple w/ fair ROM.  CV: RRR, no m/r/g, no peripheral edema. Pulses intact, +2 bilaterally. No cyanosis, pallor or clubbing. PULMONARY:  Unlabored, regular breathing. Clear bilaterally A&P w/o wheezes/rales/rhonchi. No accessory muscle use. No dullness to percussion. GI: BS present and normoactive. Soft, non-tender to palpation.  MSK: No erythema, warmth or tenderness.  Neuro: A/Ox3. No focal deficits noted.   Skin: Warm, no lesions or rashe Psych: Normal affect and behavior. Judgement and thought content appropriate.     Lab Results:  CBC    Component Value Date/Time   WBC 5.2 01/12/2022 1245   WBC 4.4 08/01/2021 1417   RBC 3.90 01/12/2022 1247   RBC 3.91 01/12/2022 1245   HGB 12.8 01/12/2022 1245   HGB 10.9 (L) 09/18/2018 0853   HGB 12.6 07/09/2016 1529   HCT 38.4 01/12/2022 1245   HCT 42.7 11/20/2021 1104   HCT 38.2 07/09/2016 1529  PLT 219 01/12/2022 1245   PLT 233 09/18/2018 0853   MCV 98.2 01/12/2022 1245   MCV 94 09/18/2018 0853   MCV 96.9 07/09/2016 1529   MCH 32.7 01/12/2022 1245   MCHC 33.3 01/12/2022 1245   RDW 13.6 01/12/2022 1245   RDW 14.8 09/18/2018 0853   RDW 13.5 07/09/2016 1529   LYMPHSABS 1.3 01/12/2022 1245   LYMPHSABS 1.3 09/18/2018 0853   LYMPHSABS 1.6 07/09/2016 1529   MONOABS 0.3 01/12/2022 1245   MONOABS 0.3 07/09/2016 1529   EOSABS 0.0 01/12/2022 1245   EOSABS 0.2  09/18/2018 0853   BASOSABS 0.0 01/12/2022 1245   BASOSABS 0.0 09/18/2018 0853   BASOSABS 0.0 07/09/2016 1529    BMET    Component Value Date/Time   NA 141 11/20/2021 1104   K 4.1 11/20/2021 1104   CL 104 11/20/2021 1104   CO2 22 11/20/2021 1104   GLUCOSE 85 11/20/2021 1104   GLUCOSE 97 08/01/2021 1417   BUN 14 11/20/2021 1104   CREATININE 0.95 11/20/2021 1104   CREATININE 0.94 06/29/2021 1105   CALCIUM 8.6 (L) 11/20/2021 1104   GFRNONAA >60 08/01/2021 1417   GFRNONAA 93 06/30/2020 1105   GFRAA 108 06/30/2020 1105    BNP No results found for: "BNP"   Imaging:  NCV with EMG (electromyography)  Result Date: 02/02/2022 Magnus Sinning, MD     02/11/2022  6:15 PM EMG & NCV Findings: Evaluation of the left ulnar motor nerve showed decreased conduction velocity (B Elbow-Wrist, 49 m/s) and decreased conduction velocity (A Elbow-B Elbow, 40 m/s).  The right median (across palm) sensory nerve showed prolonged distal peak latency (Wrist, 3.7 ms) and prolonged distal peak latency (Palm, 2.1 ms).  The left ulnar sensory nerve showed prolonged distal peak latency (3.8 ms) and decreased conduction velocity (Wrist-5th Digit, 37 m/s).  All remaining nerves (as indicated in the following tables) were within normal limits.  Left vs. Right side comparison data for the ulnar motor nerve indicates abnormal L-R velocity difference (A Elbow-B Elbow, 33 m/s).  All examined muscles (as indicated in the following table) showed no evidence of electrical instability.  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 There is no significant electrodiagnostic evidence of any other focal nerve entrapment, brachial plexopathy or cervical radiculopathy. Recommendations: 1.  Follow-up with referring physician. 2.  Continue current management of symptoms. 3.  Suggest  surgical evaluation. 4.  Continue use of resting splint at night-time and as needed during the day. ___________________________ Wonda Olds Board Certified, American Board of Physical Medicine and Rehabilitation Nerve Conduction Studies Anti Sensory Summary Table  Stim Site NR Peak (ms) Norm Peak (ms) P-T Amp (V) Norm P-T Amp Site1 Site2 Delta-P (ms) Dist (cm) Vel (m/s) Norm Vel (m/s) Left Median Acr Palm Anti Sensory (2nd Digit)  29.4C Wrist    3.6 <3.6 20.0 >10 Wrist Palm 1.6 0.0   Palm    2.0 <2.0 29.5        Right Median Acr Palm Anti Sensory (2nd Digit)  30C Wrist    *3.7 <3.6 25.6 >10 Wrist Palm 1.6 0.0   Palm    *2.1 <2.0 30.3        Left Radial Anti Sensory (Base 1st Digit)  29.1C Wrist    2.3 <3.1 26.1  Wrist Base 1st Digit 2.3 0.0   Left Ulnar Anti Sensory (5th Digit)  29.5C Wrist    *3.8 <3.7 20.7 >15.0 Wrist 5th Digit 3.8 14.0 *37 >38 Motor Summary Table  Stim Site NR Onset (ms) Norm Onset (ms) O-P Amp (mV) Norm O-P Amp Site1 Site2 Delta-0 (ms) Dist (cm) Vel (m/s) Norm Vel (m/s) Left Median Motor (Abd Poll Brev)  29.2C Wrist    3.4 <4.2 6.4 >5 Elbow Wrist 4.0 22.5 56 >50 Elbow    7.4  5.9        Left Ulnar Motor (Abd Dig Min)  29C Wrist    3.2 <4.2 7.8 >3 B Elbow Wrist 4.3 21.0 *49 >53 B Elbow    7.5  7.2  A Elbow B Elbow 2.5 10.0 *40 >53 A Elbow    10.0  4.5        Right Ulnar Motor (Abd Dig Min)  30.2C Wrist    2.7 <4.2 8.6 >3 B Elbow Wrist 3.9 21.0 54 >53 B Elbow    6.6  8.4  A Elbow B Elbow 1.5 11.0 73 >53 A Elbow    8.1  6.6        EMG  Side Muscle Nerve Root Ins Act Fibs Psw Amp Dur Poly Recrt Int Fraser Din Comment Left Abd Poll Brev Median C8-T1 Nml Nml Nml Nml Nml 0 Nml Nml  Left 1stDorInt Ulnar C8-T1 Nml Nml Nml Nml Nml 0 Nml Nml  Left PronatorTeres Median C6-7 Nml Nml Nml Nml Nml 0 Nml Nml  Left Biceps Musculocut C5-6 Nml Nml Nml Nml Nml 0 Nml Nml  Left Deltoid Axillary C5-6 Nml Nml Nml Nml Nml 0 Nml Nml  Nerve Conduction Studies Anti Sensory Left/Right Comparison  Stim Site L Lat (ms)  R Lat (ms) L-R Lat (ms) L Amp (V) R Amp (V) L-R Amp (%) Site1 Site2 L Vel (m/s) R Vel (m/s) L-R Vel (m/s) Median Acr Palm Anti Sensory (2nd Digit)  29.4C Wrist 3.6 *3.7 0.1 20.0 25.6 21.9 Wrist Palm    Palm 2.0 *2.1 0.1 29.5 30.3 2.6      Radial Anti Sensory (Base 1st Digit)  29.1C Wrist 2.3   26.1   Wrist Base 1st Digit    Ulnar Anti Sensory (5th Digit)  29.5C Wrist *3.8   20.7   Wrist 5th Digit *37   Motor Left/Right Comparison  Stim Site L Lat (ms) R Lat (ms) L-R Lat (ms) L Amp (mV) R Amp (mV) L-R Amp (%) Site1 Site2 L Vel (m/s) R Vel (m/s) L-R Vel (m/s) Median Motor (Abd Poll Brev)  29.2C Wrist 3.4   6.4   Elbow Wrist 56   Elbow 7.4   5.9        Ulnar Motor (Abd Dig Min)  29C Wrist 3.2 2.7 0.5 7.8 8.6 9.3 B Elbow Wrist *49 54 5 B Elbow 7.5 6.6 0.9 7.2 8.4 14.3 A Elbow B Elbow *40 73 *33 A Elbow 10.0 8.1 1.9 4.5 6.6 31.8      Waveforms:           lidocaine (XYLOCAINE) 1 % (with pres) injection 3 mL     Date Action Dose Route User   01/11/2022 1639 Given 3 mL Other Erskine Emery W, PA-C      methylPREDNISolone acetate (DEPO-MEDROL) injection 40 mg     Date Action Dose Route User   01/11/2022 1639 Given 40 mg Intra-articular Pete Pelt, PA-C          Latest Ref Rng & Units 02/15/2022    9:05 AM  PFT Results  FVC-Pre L 3.01  P  FVC-Predicted Pre % 82  P  FVC-Post L 2.99  P  FVC-Predicted Post % 81  P  Pre FEV1/FVC % % 86  P  Post FEV1/FCV % % 86  P  FEV1-Pre L 2.59  P  FEV1-Predicted Pre % 91  P  FEV1-Post L 2.58  P  DLCO uncorrected ml/min/mmHg 20.51  P  DLCO UNC% % 92  P  DLCO corrected ml/min/mmHg 20.91  P  DLCO COR %Predicted % 93  P  DLVA Predicted % 109  P  TLC L 5.02  P  TLC % Predicted % 91  P  RV % Predicted % 90  P    P Preliminary result    No results found for: "NITRICOXIDE"      Assessment & Plan:   Mild obstructive sleep apnea Excellent compliance per her report. No download available today - we have contacted her DME, [employer], so we can  obtain this. She has received good benefit from use thus far. Sleep maintenance has improved as well. Cautioned on safe driving practices.  Patient Instructions  Continue CPAP auto 5-15 cmH2O with nasal pillow mask every night, minimum of 4-6 hours a night.  Change equipment every 30 days or as directed by DME. Wash your tubing with warm soap and water daily, hang to dry. Wash humidifier portion weekly.  Be aware of reduced alertness and do not drive or operate heavy machinery if experiencing this or drowsiness.  Exercise encouraged, as tolerated. Avoid or decrease alcohol consumption and medications that make you more sleepy, if possible.   Continue Albuterol inhaler 2 puffs every 6 hours as needed for shortness of breath or wheezing  Continue working on managing your mood. If you feel like you are still having trouble with your sleep, let me know and we can bring you back in sooner to discuss treatment options for your insomnia   Follow up in 6 months with Dr. Halford Chessman (new pt 30 min slot) or Katie Keilan Nichol,NP. If symptoms do not improve or worsen, please contact office for sooner follow up.    Shortness of breath DOE post COVID infection May 2023. Symptoms have improved with treatment of OSA and weight loss. PFTs today were normal. Advised her to continue monitoring and notify us if she feels like her symptoms return/worsen. Encouraged her to continue working on healthy weight loss measures.   Morbid obesity (HCC) BMI 41. Healthy weight loss measures encouraged. She is working with medical weight management.    I spent 32 minutes of dedicated to the care of this patient on the date of this encounter to include pre-visit review of records, face-to-face time with the patient discussing conditions above, post visit ordering of testing, clinical documentation with the electronic health record, making appropriate referrals as documented, and communicating necessary findings to members of the patients  care team.  Clayton Bibles, NP 02/15/2022  Pt aware and understands NP's role.

## 2022-02-15 NOTE — Assessment & Plan Note (Signed)
Excellent compliance per her report. No download available today - we have contacted her DME, [employer], so we can obtain this. She has received good benefit from use thus far. Sleep maintenance has improved as well. Cautioned on safe driving practices.  Patient Instructions  Continue CPAP auto 5-15 cmH2O with nasal pillow mask every night, minimum of 4-6 hours a night.  Change equipment every 30 days or as directed by DME. Wash your tubing with warm soap and water daily, hang to dry. Wash humidifier portion weekly.  Be aware of reduced alertness and do not drive or operate heavy machinery if experiencing this or drowsiness.  Exercise encouraged, as tolerated. Avoid or decrease alcohol consumption and medications that make you more sleepy, if possible.   Continue Albuterol inhaler 2 puffs every 6 hours as needed for shortness of breath or wheezing  Continue working on managing your mood. If you feel like you are still having trouble with your sleep, let me know and we can bring you back in sooner to discuss treatment options for your insomnia   Follow up in 6 months with Victoria [name] (new pt 30 min slot) or Victoria Haliegh Khurana,NP. If symptoms do not improve or worsen, please contact office for sooner follow up.

## 2022-02-15 NOTE — Progress Notes (Signed)
Full PFT performed today. °

## 2022-02-16 ENCOUNTER — Other Ambulatory Visit (HOSPITAL_COMMUNITY): Payer: Self-pay

## 2022-02-18 NOTE — Progress Notes (Signed)
Reviewed and agree with assessment/plan.   Chesley Mires, MD [facility] 02/18/2022, 7:18 PM Pager:  [phone]

## 2022-02-20 ENCOUNTER — Encounter (INDEPENDENT_AMBULATORY_CARE_PROVIDER_SITE_OTHER): Payer: Self-pay | Admitting: Physician Assistant

## 2022-02-20 ENCOUNTER — Ambulatory Visit (INDEPENDENT_AMBULATORY_CARE_PROVIDER_SITE_OTHER): Payer: No Typology Code available for payment source | Admitting: Physician Assistant

## 2022-02-20 VITALS — BP 95/67 | HR 61 | Temp 98.1°F | Ht 67.0 in | Wt 262.0 lb

## 2022-02-20 DIAGNOSIS — E538 Deficiency of other specified B group vitamins: Secondary | ICD-10-CM | POA: Diagnosis not present

## 2022-02-20 DIAGNOSIS — E559 Vitamin D deficiency, unspecified: Secondary | ICD-10-CM

## 2022-02-20 DIAGNOSIS — Z9884 Bariatric surgery status: Secondary | ICD-10-CM

## 2022-02-20 DIAGNOSIS — E161 Other hypoglycemia: Secondary | ICD-10-CM | POA: Diagnosis not present

## 2022-02-20 DIAGNOSIS — R5383 Other fatigue: Secondary | ICD-10-CM | POA: Diagnosis not present

## 2022-02-20 DIAGNOSIS — E669 Obesity, unspecified: Secondary | ICD-10-CM

## 2022-02-20 DIAGNOSIS — Z6841 Body Mass Index (BMI) 40.0 and over, adult: Secondary | ICD-10-CM

## 2022-02-21 ENCOUNTER — Other Ambulatory Visit (HOSPITAL_COMMUNITY): Payer: Self-pay

## 2022-02-21 LAB — CMP14+EGFR
ALT: 33 IU/L — ABNORMAL HIGH (ref 0–32)
AST: 21 IU/L (ref 0–40)
Albumin/Globulin Ratio: 1.4 (ref 1.2–2.2)
Albumin: 4 g/dL (ref 3.9–4.9)
Alkaline Phosphatase: 70 IU/L (ref 44–121)
BUN/Creatinine Ratio: 16 (ref 12–28)
BUN: 15 mg/dL (ref 8–27)
Bilirubin Total: 0.3 mg/dL (ref 0.0–1.2)
CO2: 22 mmol/L (ref 20–29)
Calcium: 9.2 mg/dL (ref 8.7–10.3)
Chloride: 105 mmol/L (ref 96–106)
Creatinine, Ser: 0.96 mg/dL (ref 0.57–1.00)
Globulin, Total: 2.9 g/dL (ref 1.5–4.5)
Glucose: 84 mg/dL (ref 70–99)
Potassium: 4.3 mmol/L (ref 3.5–5.2)
Sodium: 143 mmol/L (ref 134–144)
Total Protein: 6.9 g/dL (ref 6.0–8.5)
eGFR: 67 mL/min/{1.73_m2} (ref >59)

## 2022-02-21 LAB — INSULIN, RANDOM: INSULIN: 5.4 u[IU]/mL (ref 2.6–24.9)

## 2022-02-21 LAB — LIPID PANEL WITH LDL/HDL RATIO
Cholesterol, Total: 191 mg/dL (ref 100–199)
HDL: 84 mg/dL (ref >39)
LDL Chol Calc (NIH): 98 mg/dL (ref 0–99)
LDL/HDL Ratio: 1.2 ratio (ref 0.0–3.2)
Triglycerides: 43 mg/dL (ref 0–149)
VLDL Cholesterol Cal: 9 mg/dL (ref 5–40)

## 2022-02-21 LAB — HEMOGLOBIN A1C
Est. average glucose Bld gHb Est-mCnc: 120 mg/dL
Hgb A1c MFr Bld: 5.8 % — ABNORMAL HIGH (ref 4.8–5.6)

## 2022-02-21 LAB — VITAMIN B12: Vitamin B-12: 583 pg/mL (ref 232–1245)

## 2022-02-21 LAB — VITAMIN D 25 HYDROXY (VIT D DEFICIENCY, FRACTURES): Vit D, 25-Hydroxy: 50.1 ng/mL (ref 30.0–100.0)

## 2022-02-21 LAB — TSH: TSH: 1.04 u[IU]/mL (ref 0.450–4.500)

## 2022-02-22 ENCOUNTER — Other Ambulatory Visit (HOSPITAL_COMMUNITY): Payer: Self-pay

## 2022-02-22 NOTE — Progress Notes (Signed)
Chief Complaint:   OBESITY Victoria [name] is here to discuss her progress with her obesity treatment plan along with follow-up of her obesity related diagnoses. [name] is on the Category 2 Plan and states she is following her eating plan approximately 0% of the time. [name] states she is exercising 0 minutes 0 times per week.  Today's visit was #: 6 Starting weight: 275 lbs Starting date: 11/20/2021 Today's weight: 260 lbs Today's date: 02/07/2022 Total lbs lost to date: 15 lbs Total lbs lost since last in-office visit: 2  Interim History: Victoria [name] has done well with weight loss.  Has difficulty getting all calories in.  Breakfast - protein shake, lunch-yogurt, snack-peanut, dinner-hamburger helper, snack- Yazzo bar and ginger bread cookies.  Reports decreased appetite and hunger.   Note: S/P GB /2018 and has difficulty tolerating large amounts at one time.   Subjective:   1. Vitamin D deficiency [name] is taking ergocalciferol twice weekly. Denies any side effects.  2. Obstructive sleep apnea Darleny feels like CPAP has made a difference. Has pulmonary function test scheduled for next week per Pulmonary.  3. Osteoarthritis of right knee, unspecified osteoarthritis type Victoria [name] is working on healthy eating plan, decreasing simple carbs to promote weight loss and plans for R total knee arthroplasty per orthopedics in future.  4. Depressed mood [name] is taking Lexapro 10 mg daily. Notes mood is stable--Denies suicidal ideas, and homicidal ideas. May need to increase dose at next visit as reports not much response yet. Assessment/Plan:   1. Vitamin D deficiency Continue ergocalciferol twice a week. Check labs next visit.  2. Obstructive sleep apnea Continue CPAP-will follow up on PFT's next visit.  3. Osteoarthritis of right knee, unspecified osteoarthritis type Continue prescribed nutrition plan to decrease simple carbohydrates, decrease saturated fat, increase lean proteins and  exercise to promote weight loss.  4. Depressed mood Continue Lexapro 10 mg daily--Continue healthy eating plan and exercise.  5. Obesity with current BMI of 40.8 Tamie is currently in the action stage of change. As such, her goal is to continue with weight loss efforts. She has agreed to the Category 2 Plan.   Exercise goals: All adults should avoid inactivity. Some physical activity is better than none, and adults who participate in any amount of physical activity gain some health benefits.  Behavioral modification strategies: increasing lean protein intake, decreasing simple carbohydrates, better snacking choices, and holiday eating strategies .  Victoria [name] has agreed to follow-up with our clinic in 3 weeks. She was informed of the importance of frequent follow-up visits to maximize her success with intensive lifestyle modifications for her multiple health conditions.   Objective:   Blood pressure 119/79, pulse (!) 58, temperature (!) 97.5 F (36.4 C), height '5\' 7"'$  (1.702 m), weight 260 lb (117.9 kg), SpO2 99 %. Body mass index is 40.72 kg/m.  General: Cooperative, alert, well developed, in no acute distress. HEENT: Conjunctivae and lids unremarkable. Cardiovascular: Regular rhythm.  Lungs: Normal work of breathing. Neurologic: No focal deficits.   Lab Results  Component Value Date   CREATININE 0.96 02/20/2022   BUN 15 02/20/2022   NA 143 02/20/2022   K 4.3 02/20/2022   CL 105 02/20/2022   CO2 22 02/20/2022   Lab Results  Component Value Date   ALT 33 (H) 02/20/2022   AST 21 02/20/2022   ALKPHOS 70 02/20/2022   BILITOT 0.3 02/20/2022   Lab Results  Component Value Date   HGBA1C 5.8 (H) 02/20/2022   HGBA1C 5.7 (  H) 11/20/2021   HGBA1C 5.4 06/30/2020   HGBA1C 5.7 (H) 05/16/2020   HGBA1C 5.5 03/15/2017   Lab Results  Component Value Date   INSULIN 5.4 02/20/2022   INSULIN 10.0 11/20/2021   INSULIN 6.2 07/07/2020   INSULIN 4 09/16/2015   Lab Results  Component  Value Date   TSH 1.040 02/20/2022   Lab Results  Component Value Date   CHOL 191 02/20/2022   HDL 84 02/20/2022   LDLCALC 98 02/20/2022   TRIG 43 02/20/2022   CHOLHDL 2.8 01/04/2022   Lab Results  Component Value Date   VD25OH 50.1 02/20/2022   VD25OH 29.4 (L) 11/20/2021   VD25OH 18.9 (L) 07/07/2020   Lab Results  Component Value Date   WBC 5.2 01/12/2022   HGB 12.8 01/12/2022   HCT 38.4 01/12/2022   MCV 98.2 01/12/2022   PLT 219 01/12/2022   Lab Results  Component Value Date   IRON 123 01/12/2022   TIBC 343 01/12/2022   FERRITIN 21 01/12/2022   Attestation Statements:   Reviewed by clinician on day of visit: allergies, medications, problem list, medical history, surgical history, family history, social history, and previous encounter notes.  I, Brendell Tyus, am acting as transcriptionist for [employer], [employer].  I have reviewed the above documentation for accuracy and completeness, and I agree with the above. -  Glori Machnik,PA-C

## 2022-02-24 NOTE — Progress Notes (Signed)
   Subjective:    Patient ID: Victoria [name], female    DOB: 29-Jun-1960, 61 y.o.   MRN: [id number]  HPI Victoria [name] continues not to feel well.  She is having some issues with pain.  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  She has consultation with Victoria [name] in January regarding possible hyperparathyroidism.  Her lipid panel is normal.  I did give her 15 tablets of Norco 10 mg today to take sparingly for pain.  Also have increased Lexapro from 5 to 10 mg daily to see if that will help pain as well.  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  She now has more of a sedentary job but still having considerable pain and simply does not feel well.    Review of Systems see above- sometimes does not sleep well at night     Objective:   Physical Exam  Her affect is flat.  She looks fatigued.      Assessment & Plan:  Fatigue-etiology unclear-to be evaluated for hyperparathyroidism by Endocrinology  Chronic musculoskeletal pain-5-day supply Norco 10/325 to take sparingly for pain.  May need pain management consultation.  Depression-increase Lexapro from 5 to 10 mg daily  Hypothyroidism maintained on thyroid replacement medication and TSH is stable  Hyperlipidemia-improved total cholesterol from 2 20-1 77 and LDL improved from 113 to 98  History of B12 deficiency-gives herself B12 injections monthly and B12 level is normal.  Impaired glucose tolerance-mild  Plan: Increasing Lexapro to 10 mg daily.  Given 5-day supply of Norco 10/325 to take sparingly for pain.  Advised pain management consultation.  Seeing endocrinologist in January regarding possible hyperparathyroidism.  May need to consider whether she can continue with current job based on her medical issues.

## 2022-02-26 DIAGNOSIS — G4733 Obstructive sleep apnea (adult) (pediatric): Secondary | ICD-10-CM | POA: Diagnosis not present

## 2022-02-28 ENCOUNTER — Other Ambulatory Visit: Payer: Self-pay

## 2022-03-02 ENCOUNTER — Other Ambulatory Visit: Payer: Self-pay

## 2022-03-05 ENCOUNTER — Other Ambulatory Visit: Payer: Self-pay

## 2022-03-05 ENCOUNTER — Encounter: Payer: Self-pay | Admitting: Family

## 2022-03-05 ENCOUNTER — Other Ambulatory Visit (HOSPITAL_COMMUNITY): Payer: Self-pay

## 2022-03-05 NOTE — Telephone Encounter (Signed)
Needs follow up with Dr. Ninfa Linden prior to [facility] of shoulder.

## 2022-03-07 ENCOUNTER — Telehealth: Payer: Self-pay | Admitting: *Deleted

## 2022-03-07 NOTE — Telephone Encounter (Signed)
Victoria [name] (Key: [place]) PA Case ID #: [id number]  PA [employer] complete Waiting on approval

## 2022-03-07 NOTE — Telephone Encounter (Signed)
Approved today The request has been approved. The authorization is effective from 03/07/2022 to 09/04/2022, as long as the member is enrolled in their current health plan. The request was approved with a quantity restriction. This has been approved for a max daily dosage of 1. A written notification letter will follow with additional details.  [employer] approved

## 2022-03-08 NOTE — Progress Notes (Addendum)
Chief Complaint:   OBESITY Victoria [name] is here to discuss her progress with her obesity treatment plan along with follow-up of her obesity related diagnoses. [name] is on the Category 4 Plan and states she is following her eating plan approximately 0% of the time. [name] states she is exercising 0 minutes 0 times per week.  Today's visit was #: 7 Starting weight: 275 lbs Starting date: 11/20/2021 Today's weight: 262 lbs Today's date: 02/20/2022 Total lbs lost to date: 13 lbs Total lbs lost since last in-office visit: 0  Interim History: Victoria [name] reports she is having trouble tolerating large quantities of meat and she is interested in alternatives to meet her protein and overall calorie needs.  She has a history of a gastric bypass in 2018 and feels that this is contributing to her difficulty tolerating large volumes of meat at a time.  She reports she has been supplementing with protein shakes in order to get in her protein needs.  She also notes a couple of episodes of hypoglycemia following high carbohydrate foods. She is returning to work tomorrow and is concerned about her blood sugar fluctuations and hypoglycemia.  She inquired about trying a GLP-1 medication and we discussed that this might be difficult given her recent issues with hypoglycemia and already having difficulty getting insufficient protein.  We also discussed ways to substitute her protein, including vegetarian substitutions and again utilizing protein shakes if she is not meeting her protein goals.  Subjective:   1. Other hypoglycemia-post bariatric Jashae's blood sugars down to 58-usually notices when increase of carbs.  Discussed working on healthy eating plan to decrease simple carbs, increase lean protein protein to avoid episodes of hypoglycemia.  2. Vitamin D deficiency [name] is taking ergocalciferol twice weekly.  Denies side effects.  3. Vitamin B 12 deficiency [name] is on monthly vitamin B12 injections.  She  notes fatigue.  4. Other fatigue [name] notes fatigue. She is a recent studies for iron deficiency anemia on 01/12/2022 were much improved.  5. History of Roux-en-Y gastric bypass Victoria [name] is at risk for malabsorption following gastric bypass and difficulty eating large volumes at a time..  Assessment/Plan:   1. Other hypoglycemia-post bariatric We will obtain labs today.  Continue prescribed nutrition plan to decrease simple carbohydrates, increase lean protein and exercise to promote weight loss..  - CMP14+EGFR - Hemoglobin A1c - Insulin, random  2. Vitamin D deficiency We will obtain labs today. Continue ergocalciferol twice weekly.  3. Vitamin B 12 deficiency We will obtain labs today.  Continue monthly B12 injections and prescribed nutrition plan.  - Vitamin B12  4. Other fatigue We will obtain labs today. Continue healthy eating plan and exercise.  - TSH - VITAMIN D 25 Hydroxy (Vit-D Deficiency, Fractures)  5. History of Roux-en-Y gastric bypass We will obtain labs today.  Continue prescribed nutrition plan to decrease simple carbohydrates and increase lean protein and exercise to promote weight loss..  - Lipid Panel With LDL/HDL Ratio  6. Obesity with current BMI of 41.0 Victoria [name] is currently in the action stage of change. As such, her goal is to continue with weight loss efforts. She has agreed to the Category 1 Plan and the [place].   Exercise goals: All adults should avoid inactivity. Some physical activity is better than none, and adults who participate in any amount of physical activity gain some health benefits.  Behavioral modification strategies: increasing lean protein intake, decreasing simple carbohydrates, no skipping meals, meal planning and cooking strategies,  keeping healthy foods in the home, better snacking choices, and planning for success.  Selin has agreed to follow-up with our clinic in 3 weeks. She was informed of the importance of frequent  follow-up visits to maximize her success with intensive lifestyle modifications for her multiple health conditions.   [place] was informed we would discuss her lab results at her next visit unless there is a critical issue that needs to be addressed sooner. [name] agreed to keep her next visit at the agreed upon time to discuss these results.  Objective:   Blood pressure 95/67, pulse 61, temperature 98.1 F (36.7 C), height '5\' 7"'$  (1.702 m), weight 262 lb (118.8 kg), SpO2 100 %. Body mass index is 41.04 kg/m.  General: Cooperative, alert, well developed, in no acute distress. HEENT: Conjunctivae and lids unremarkable. Cardiovascular: Regular rhythm.  Lungs: Normal work of breathing. Neurologic: No focal deficits.   Lab Results  Component Value Date   CREATININE 0.96 02/20/2022   BUN 15 02/20/2022   NA 143 02/20/2022   K 4.3 02/20/2022   CL 105 02/20/2022   CO2 22 02/20/2022   Lab Results  Component Value Date   ALT 33 (H) 02/20/2022   AST 21 02/20/2022   ALKPHOS 70 02/20/2022   BILITOT 0.3 02/20/2022   Lab Results  Component Value Date   HGBA1C 5.8 (H) 02/20/2022   HGBA1C 5.7 (H) 11/20/2021   HGBA1C 5.4 06/30/2020   HGBA1C 5.7 (H) 05/16/2020   HGBA1C 5.5 03/15/2017   Lab Results  Component Value Date   INSULIN 5.4 02/20/2022   INSULIN 10.0 11/20/2021   INSULIN 6.2 07/07/2020   INSULIN 4 09/16/2015   Lab Results  Component Value Date   TSH 1.040 02/20/2022   Lab Results  Component Value Date   CHOL 191 02/20/2022   HDL 84 02/20/2022   LDLCALC 98 02/20/2022   TRIG 43 02/20/2022   CHOLHDL 2.8 01/04/2022   Lab Results  Component Value Date   VD25OH 50.1 02/20/2022   VD25OH 29.4 (L) 11/20/2021   VD25OH 18.9 (L) 07/07/2020   Lab Results  Component Value Date   WBC 5.2 01/12/2022   HGB 12.8 01/12/2022   HCT 38.4 01/12/2022   MCV 98.2 01/12/2022   PLT 219 01/12/2022   Lab Results  Component Value Date   IRON 123 01/12/2022   TIBC 343 01/12/2022    FERRITIN 21 01/12/2022   Attestation Statements:   Reviewed by clinician on day of visit: allergies, medications, problem list, medical history, surgical history, family history, social history, and previous encounter notes.  I, Brendell Tyus, am acting as transcriptionist for [employer], [employer].  I have reviewed the above documentation for accuracy and completeness, and I agree with the above. -  Leonardo Makris,PA-C

## 2022-03-10 ENCOUNTER — Ambulatory Visit
Admission: RE | Admit: 2022-03-10 | Discharge: 2022-03-10 | Disposition: A | Discharge status: Home / Self Care | Payer: 59 | Source: Ambulatory Visit | Attending: Physician Assistant | Admitting: Physician Assistant

## 2022-03-10 DIAGNOSIS — R600 Localized edema: Secondary | ICD-10-CM | POA: Diagnosis not present

## 2022-03-10 DIAGNOSIS — M7552 Bursitis of left shoulder: Secondary | ICD-10-CM | POA: Diagnosis not present

## 2022-03-10 DIAGNOSIS — G8929 Other chronic pain: Secondary | ICD-10-CM

## 2022-03-10 DIAGNOSIS — M25412 Effusion, left shoulder: Secondary | ICD-10-CM | POA: Diagnosis not present

## 2022-03-10 DIAGNOSIS — M19012 Primary osteoarthritis, left shoulder: Secondary | ICD-10-CM | POA: Diagnosis not present

## 2022-03-13 ENCOUNTER — Other Ambulatory Visit (HOSPITAL_COMMUNITY): Payer: Self-pay

## 2022-03-15 ENCOUNTER — Ambulatory Visit: Payer: 59 | Admitting: Internal Medicine

## 2022-03-15 ENCOUNTER — Encounter: Payer: Self-pay | Admitting: Internal Medicine

## 2022-03-15 VITALS — BP 128/76 | HR 65 | Ht 67.0 in | Wt 277.2 lb

## 2022-03-15 DIAGNOSIS — K912 Postsurgical malabsorption, not elsewhere classified: Secondary | ICD-10-CM | POA: Diagnosis not present

## 2022-03-15 DIAGNOSIS — E211 Secondary hyperparathyroidism, not elsewhere classified: Secondary | ICD-10-CM

## 2022-03-15 DIAGNOSIS — E890 Postprocedural hypothyroidism: Secondary | ICD-10-CM | POA: Diagnosis not present

## 2022-03-15 NOTE — Patient Instructions (Addendum)
Please stop at the lab.  Patient information (Up-to-Date): Collection of a 24-hour urine specimen  - You should collect every drop of urine during each 24-hour period. It does not matter how much or little urine is passed each time, as long as every drop is collected. - Begin the urine collection in the morning after you wake up, after you have emptied your bladder for the first time. - Urinate (empty the bladder) for the first time and flush it down the toilet. Note the exact time (eg, 6:15 AM). You will begin the urine collection at this time. - Collect every drop of urine during the day and night in an empty collection bottle. Store the bottle at room temperature or in the refrigerator. - If you need to have a bowel movement, any urine passed with the bowel movement should be collected. Try not to include feces with the urine collection. If feces does get mixed in, do not try to remove the feces from the urine collection bottle. - Finish by collecting the first urine passed the next morning, adding it to the collection bottle. This should be within ten minutes before or after the time of the first morning void on the first day (which was flushed). In this example, you would try to void between 6:05 and 6:25 on the second day. - If you need to urinate one hour before the final collection time, drink a full glass of water so that you can void again at the appropriate time. If you have to urinate 20 minutes before, try to hold the urine until the proper time. - Please note the exact time of the final collection, even if it is not the same time as when collection began on day 1. - The bottle(s) may be kept at room temperature for a day or two, but should be kept cool or refrigerated for longer periods of time.  Eat lower glycemic index/load foods: Www.glycemicindex.com Eat solids rather than liquids or semiliquid meals Have 3 meals a day + as needed snacks Eat as much fruit and veggies as you can.  Berries, pears, apples, peaches, apricots  - are the best Do not drink liquids with a meal, separate them by at least 30 min Start the meal with protein and fat and end with carbs Do not drink any sweet drinks Carry a snack with you everywhere. Best - to contain ~15 g of carbs  You should have an endocrinology follow-up appointment in 6 months.

## 2022-03-15 NOTE — Progress Notes (Unsigned)
Patient ID: Victoria [name], female   DOB: 1960-09-28, 62 y.o.   MRN: [id number]  HPI  Victoria [name] is a 62 y.o.-year-old female, referred by her PCP, Dr. Renold [name], for evaluation for hyperparathyroidism.  She was previously seen by Dr. 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 She is the wife of Victoria [name], also my pt.  Hyperparathyroidism: Pt was dx with hyperparathyroidism in 10/2021.  No history of hypercalcemia.  I reviewed pt's pertinent labs: Lab Results  Component Value Date   PTH 157 (H) 12/07/2021   PTH 101 (H) 11/20/2021   CALCIUM 9.2 02/20/2022   CALCIUM 8.6 (L) 11/20/2021   CALCIUM 8.2 (L) 08/01/2021   CALCIUM 8.5 (L) 06/29/2021   CALCIUM 9.2 12/29/2020   CALCIUM 9.0 06/30/2020   CALCIUM 9.2 03/03/2020   CALCIUM 9.2 04/09/2019   CALCIUM 8.6 (L) 10/16/2018   CALCIUM 9.3 10/02/2018   To investigate of hyperparathyroidism, she had a Technetium sestamibi scan (12/16/2021) showing decreased uptake in the thyroid gland, but no parathyroid adenoma evident.  Patient has a pertinent history of R en Y gastric bypass in 2016 >> lost 93 lbs and then gained ~all back.  She is currently seen in the weight management clinic.  She is currently on supplementation with: - no Calcium - not taking the recommended supplements; she was advised to take prenatal vitamins but not taking them due to constipation - Vitamin D - ergocalciferol 50,000 units 2x a week  No fractures or falls.   She had TAH 2002 -went through menopause in her 76s.  She has osteoarthritis.  No h/o kidney stones. She has a cyst >> saw urology.  No h/o CKD. Last BUN/Cr: Lab Results  Component Value Date   BUN 15 02/20/2022   BUN 14 11/20/2021   CREATININE 0.96 02/20/2022   CREATININE 0.95 11/20/2021   Pt is not on HCTZ.  She has a h/o vitamin D deficiency. Reviewed vit D levels: Lab Results  Component Value Date   VD25OH 50.1 02/20/2022    VD25OH 29.4 (L) 11/20/2021   VD25OH 18.9 (L) 07/07/2020   VD25OH 41 10/02/2018   VD25OH 27 (L) 03/17/2018   VD25OH 32 03/15/2017   VD25OH 34 03/12/2016   VD25OH 30 02/25/2015   VD25OH 38 08/17/2014   VD25OH 19 (L) 02/22/2014   Pt does not have a FH of hypercalcemia, pituitary tumors, thyroid cancer, or osteoporosis.   Pt. 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  Glucose levels were always normal on labs per review of the chart.  She recently had dental work >> drank smoothies >> sugars dropped to 34; more recently 70s is able to eat semisolid or solid meals.  Reviewed HbA1c levels -in the prediabetic range: Lab Results  Component Value Date   HGBA1C 5.8 (H) 02/20/2022   HGBA1C 5.7 (H) 11/20/2021   HGBA1C 5.4 06/30/2020   HGBA1C 5.7 (H) 05/16/2020   HGBA1C 5.5 03/15/2017   HGBA1C 5.7 (H) 09/09/2015   HGBA1C 5.7 (H) 08/20/2013   She was previously on acarbose prescribed by Dr. Dwyane [name].  She contemplated starting a GLP-1 receptor agonist, but was recommended against it due to her history of reactive hypoglycemia.  She has a history of diabetes in her sister.  She has a h/o Hyperthyroidism in 1970s >> had almost complete thyroidectomy (1980) >> postsurgical hypothyroidism:  Lab Results  Component Value Date   TSH 1.040 02/20/2022   TSH 1.91 06/29/2021  TSH 2.90 12/29/2020   TSH 0.60 06/30/2020   TSH 1.01 03/03/2020   TSH 0.72 01/07/2020   TSH 0.84 04/09/2019   TSH 3.600 09/18/2018   TSH 2.78 03/17/2018   TSH 2.66 09/09/2017   TSH 1.83 03/15/2017   TSH 1.40 09/03/2016   TSH 2.58 03/12/2016   TSH 2.70 09/09/2015   TSH 2.82 07/05/2015   TSH 11.38 (H) 05/26/2015   TSH 0.06 (L) 04/25/2015   TSH 0.012 (L) 02/25/2015   TSH 0.012 (L) 08/17/2014   TSH 0.266 (L) 02/22/2014   She is on Levothyroxine 112 mcg daily: - in am - fasting - at least 30 min from b'fast - no calcium - no iron - no multivitamins now, was on Prenatals -  constipation - no PPIs (has Protonix on hand) - not on Biotin  + FH of thyroid ds. In [place], [place] aunt, brother.  She has pernicious anemia - on B12 inj.  ROS: Constitutional: + weight gain, + fatigue, + heat intolerance, + hot flashes, + poor sleep Eyes: no blurry vision, no xerophthalmia ENT: no sore throat, no nodules palpated in throat, no dysphagia/odynophagia, + hoarseness, + tinnitus Cardiovascular: no CP/+ SOB/+ palpitations/+ leg swelling Respiratory: no cough/+ SOB Gastrointestinal: +  N/no V/+ D/no C Musculoskeletal: + both:  muscle/joint aches Skin: no rashes, + easy bruising, + stretch marks, + hair loss, + excessive hair growth on chin Neurological: no tremors/numbness/tingling/dizziness, + migraines + Low libido + Anxiety, + depression  Past Medical History:  Diagnosis Date   Back pain    Bursitis    right shoulder   Chronic leukopenia    intermittant since 2010, followed by Dr. Renold [name] now   Chronic neck pain    Constipation    Degenerative joint disease of low back 09/02/2015   Dr. Maureen [name]   Displacement of lumbar intervertebral disc    Edema of both lower legs    Fibrocystic breast    Gallbladder problem    GERD (gastroesophageal reflux disease)    Headache    per pt more stress/tension   Heart palpitations    SEE EPIC ENCOUTNER , CARDIOLOGY DR. Tressia Miners [name] 2018; reports on 05-16-17 " i haven't had any bouts of those lately"     Hemorrhoids    Hidradenitis suppurativa    History of Clostridium difficile infection 12/2010   History of exercise intolerance    ETT on 04-27-2016-- negative ([facility] treadmill score 7)   History of Helicobacter pylori infection 2001 and 09/ 2012   HSV-2 infection    genital   Hx of adenomatous colonic polyps 10/17/2007   Hydradenitis    per pt currently treated with Humira injection   Hypocupremia    Hypothyroidism    Hypothyroidism, postsurgical 1980   IBS (irritable bowel syndrome)    Insomnia    Iron deficiency    Joint  pain    Leukopenia    Lumbosacral radiculopathy    Medial meniscus tear    right knee   Mild obstructive sleep apnea    Mitral regurgitation    Mild to moderate by echo 09/2021   Numbness and tingling of right arm    Obesity    Osteoarthritis    Palpitations    Pernicious anemia    b12 def   Peroneal neuropathy    PONV (postoperative nausea and vomiting)    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   Prediabetes    Reactive hypoglycemia followed by pcp   post gastrectomy dumping syndrome  SOB (shortness of breath)    Spinal stenosis    Tendonitis    right shoulder   Varicose veins    Vertigo    Vitamin B 12 deficiency    Vitamin D deficiency    Past Surgical History:  Procedure Laterality Date   BREATH TEK H PYLORI N/A 05/03/2014   Procedure: BREATH TEK H PYLORI;  Surgeon: Kaylyn Lim, MD;  Location: [facility];  Service: General;  Laterality: N/A;   CARDIOVASCULAR STRESS TEST  03/11/2013   dr Victoria [name]   Low risk nuclear study w/ a very small anterior perfusion defect that most likely represents breast attenuation artifact, less likely this could represent a true perfusion abnormality in the distribution of diagonal branch of the LAD/  normal LV function and wall motion , ef 66%   CATARACT EXTRACTION W/ INTRAOCULAR LENS  IMPLANT, BILATERAL  10/2017   [place]   COLONOSCOPY  02/2017   Dr [name] ; per patient they found 7 polyps with polypectomy; on 5 year track    ESOPHAGOGASTRODUODENOSCOPY  02/01/2021   FINE NEEDLE ASPIRATION Left 05/22/2017   Procedure: LEFT KNEE ASPIRATION;  Surgeon: Gaynelle Arabian, MD;  Location: [facility];  Service: Orthopedics;  Laterality: Left;   GASTRIC ROUX-EN-Y N/A 07/06/2014   Procedure: LAPAROSCOPIC ROUX-EN-Y GASTRIC BYPASS, ENTEROLYSIS OF ADHESIONS WITH UPPER ENDOSCOPY;  Surgeon: Johnathan Hausen, MD;  Location: [facility];  Service: General;  Laterality: N/A;   HAMMER TOE SURGERY Bilateral 02/17/2008    bilateral foot digit's 2 and 5   HIATAL HERNIA REPAIR N/A 07/06/2014   Procedure: LAPAROSCOPIC REPAIR OF HIATAL HERNIA;  Surgeon: Johnathan Hausen, MD;  Location: [facility];  Service: General;  Laterality: N/A;   I & D KNEE WITH POLY EXCHANGE Left 12/11/2019   Procedure: LEFT KNEE POLY-LINER EXCHANGE;  Surgeon: Mcarthur Rossetti, MD;  Location: [place];  Service: Orthopedics;  Laterality: Left;   KNEE ARTHROSCOPY Right 12/11/2019   Procedure: RIGHT KNEE ARTHROSCOPY WITH PARTIAL MEDIAL MENISCECTOMY;  Surgeon: Mcarthur Rossetti, MD;  Location: [place];  Service: Orthopedics;  Laterality: Right;   KNEE ARTHROSCOPY WITH MEDIAL MENISECTOMY Left 07/17/2017   Procedure: LEFT KNEE ARTHROSCOPY WITH MEDIAL LATERAL MENISECTOMY;  Surgeon: Gaynelle Arabian, MD;  Location: [facility];  Service: Orthopedics;  Laterality: Left;   MASS EXCISION N/A 10/11/2016   Procedure: EXCISION OF PERIANAL MASS;  Surgeon: [name], MD;  Location: [place];  Service: General;  Laterality: N/A;   [place]  08-25-2002   dr Margaretha Glassing   ovaries retained   TOTAL HIP ARTHROPLASTY Right 05/21/2012   Procedure: TOTAL HIP ARTHROPLASTY;  Surgeon: Gearlean Alf, MD;  Location: [facility];  Service: Orthopedics;  Laterality: Right;   TOTAL HIP ARTHROPLASTY Left 01-31-2009  dr Wynelle Link   TOTAL HIP REVISION Left 05/22/2017   Procedure: Left hip bearing surface and head revision;  Surgeon: Gaynelle Arabian, MD;  Location: [facility];  Service: Orthopedics;  Laterality: Left;   TOTAL KNEE ARTHROPLASTY Left 01/20/2018   Procedure: LEFT TOTAL KNEE ARTHROPLASTY;  Surgeon: Gaynelle Arabian, MD;  Location: [facility];  Service: Orthopedics;  Laterality: Left;  5mn   TRANSTHORACIC ECHOCARDIOGRAM  05/03/2016   ef 55-60%/  trivial MR/  mild TR   TUBAL LIGATION     Social History   Socioeconomic History   Marital status: Married    Spouse name: RBarbaraann Rondo  Number of  children: 2   Years of education: Not on file   Highest education level: Not on file  Occupational History   Occupation: Economist GI    Employer: [employer]  Tobacco Use   Smoking status: Never   Smokeless tobacco: Never  Vaping Use   Vaping Use: Never used  Substance and Sexual Activity   Alcohol use: No   Drug use: No   Sexual activity: Not Currently    Partners: Male    Birth control/protection: Surgical    Comment: hysterectomy  Other Topics Concern   Not on file  Social History Narrative   Lives at home with spouse   Right handed   Caffeine: diet zero mtn dew, occasionally    Social Determinants of Health   Financial Resource Strain: Not on file  Food Insecurity: Not on file  Transportation Needs: Not on file  Physical Activity: Not on file  Stress: Not on file  Social Connections: Not on file  Intimate Partner Violence: Not on file   Current Outpatient Medications on File Prior to Visit  Medication Sig Dispense Refill   Adalimumab (HUMIRA PEN) 40 MG/0.8ML PNKT Inject 1 pen subcutaneously every week 4 each 5   albuterol (VENTOLIN HFA) 108 (90 Base) MCG/ACT inhaler Inhale 2 puffs into the lungs every 4 hours as needed for wheezing or shortness of breath. 18 g 3   Atogepant (QULIPTA) 60 MG TABS Take 60 mg by mouth daily. 30 tablet 11   Calcium Carb-Cholecalciferol (CALCIUM + D3 PO) Take 1 tablet by mouth daily.     celecoxib (CELEBREX) 200 MG capsule Take 1 capsule (200 mg total) by mouth 2 (two) times daily as needed. 60 capsule 3   clobetasol ointment (TEMOVATE) 0.05 % Apply to skin twice daily as directed for up to 2 weeks as needed 60 g 0   cyanocobalamin (DODEX) 1000 MCG/ML injection INJECT 1 ML INTRAMUSCULARLY EVERY MONTH 3 mL 11   ergocalciferol (VITAMIN D2) 1.25 MG (50000 UT) capsule Take 1 capsule (50,000 Units total) by mouth 2 (two) times a week for Vitamin deficiency and gastric bypass. 8 capsule 1   escitalopram (LEXAPRO) 10 MG tablet Take 1 tablet  (10 mg total) by mouth daily. 90 tablet 1   Estradiol (DIVIGEL) 1.25 MG/1.25GM GEL Use one packet daily as directed 112.5 g 3   flecainide (TAMBOCOR) 50 MG tablet Take 1 tablet by mouth 2 times daily. 180 tablet 3   gabapentin (NEURONTIN) 600 MG tablet Take 1 tablet (600 mg total) by mouth 3 (three) times daily. 90 tablet 5   levocetirizine (XYZAL) 5 MG tablet Take 5 mg by mouth every evening.     levothyroxine (SYNTHROID) 112 MCG tablet Take 1 tablet (112 mcg total) by mouth daily. 90 tablet 1   LORazepam (ATIVAN) 2 MG tablet Take 1 tablet (2 mg total) by mouth at bedtime. 30 tablet 2   metoprolol succinate (TOPROL-XL) 25 MG 24 hr tablet TAKE 1 TABLET BY MOUTH DAILY. 90 tablet 3   montelukast (SINGULAIR) 10 MG tablet Take 1 tablet by mouth at bedtime. 30 tablet 3   Olopatadine HCl 0.2 % SOLN Apply 1 drop to eye daily as needed. 2.5 mL 5   pantoprazole (PROTONIX) 40 MG tablet Take 1 tablet by mouth daily 20 minutes before breakfast 90 tablet 3   Prenatal Vit-Fe Fumarate-FA (PRENATAL MULTIVITAMIN) TABS tablet Take 1 tablet by mouth 2 (two) times daily with a meal.     promethazine (  PHENERGAN) 25 MG tablet TAKE 1 TABLET BY MOUTH EVERY 8 HOURS AS NEEDED FOR NAUSEA AND/OR VOMITING 30 tablet 5   sulfamethoxazole-trimethoprim (BACTRIM DS) 800-160 MG tablet Take 1 tablet twice daily as needed for flare. 60 tablet 1   terconazole (TERAZOL 7) 0.4 % vaginal cream Place 1 applicatorful vaginally at bedtime for seven nights.   Then use one applicatorful once a week for 6 months. 45 g 4   tiZANidine (ZANAFLEX) 4 MG capsule Take 1 capsule (4 mg total) by mouth 3 (three) times daily as needed for muscle spasms. 90 capsule 1   Ubrogepant (UBRELVY) 100 MG TABS Take 100 mg by mouth every 2 (two) hours as needed. Maximum '200mg'$  a day. 16 tablet 11   Current Facility-Administered Medications on File Prior to Visit  Medication Dose Route Frequency Provider Last Rate Last Admin   gadobenate dimeglumine (MULTIHANCE)  injection 20 mL  20 mL Intravenous Once PRN Melvenia Beam, MD       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   Estradiol Rash and Other (See Comments)    Local rash with estradiol patches   Family History  Problem Relation Age of Onset   High blood pressure Mother    Hypertension Mother    Headache Mother    High Cholesterol Mother    Thyroid disease Mother    Obesity Mother    Kidney disease Mother    Hypertension Father    Diabetes Father    High blood pressure Father    High Cholesterol Father    Heart disease Father    Obesity Father    Diabetes Sister    Hypertension Sister    Thyroid disease Brother    Thyroid disease Maternal Aunt    Migraines Neg Hx    PE: BP 128/76 (BP Location: Right Arm, Patient Position: Sitting, Cuff Size: Normal)   Pulse 65   Ht '5\' 7"'$  (1.702 m)   Wt 277 lb 3.2 oz (125.7 kg)   SpO2 94%   BMI 43.42 kg/m  Wt Readings from Last 3 Encounters:  03/15/22 277 lb 3.2 oz (125.7 kg)  02/20/22 262 lb (118.8 kg)  02/15/22 267 lb (121.1 kg)   Constitutional: overweight, in NAD Eyes: PERRLA, EOMI, no exophthalmos ENT: moist mucous membranes, no thyromegaly, no cervical lymphadenopathy Cardiovascular: RRR, No MRG Respiratory: CTA B Musculoskeletal: no deformities Skin: warm, no rashes Neurological: no tremor with outstretched hands  Assessment: 1. Hyperparathyroidism  2.  Reactive hypoglycemia  3.  Postsurgical hypothyroidism  Plan: Patient has a history of gastric bypass with malabsorption and subsequent low vitamin D and calcium and elevated PTH.  We discussed that this is indicative for secondary, rather than primary hyperparathyroidism.  Her technetium sestamibi scan confirms this diagnosis.  Latest PTH level was 157 in 11/2021.  Around that time, calcium was also low, at 8.6.  Vitamin D level was slightly low, at 29.4. - No apparent complications from  hypercalcemia: no h/o nephrolithiasis, no osteoporosis, no fractures. No abdominal pain, depression, bone pain. - I discussed with the patient about the physiology of calcium and parathyroid hormone, and possible side effects from increased PTH, including kidney stones, osteoporosis. these effects, however, occurred mostly in the setting of elevated calcium, also, which she did not have. - hyperparathyroidism can be primary (Familial hypercalcemic hypocalciuria or parathyroid adenoma) or secondary (to conditions like: vitamin D deficiency, calcium malabsorption, hypercalciuria,  renal insufficiency, etc.). - In her case, etiology appears to be secondary to calcium malabsorption +/- vitamin D deficiency - in this case, the treatment is making sure that she has sufficient calcium intake.  As of now, she is not taking any calcium supplements.  I strongly believe that she has to start taking them.  She is worried about constipation and she may need to address this problem differently.  We discussed about Gummies that she can use, if she wants to avoid tablets. - Latest vitamin D level was normal, at 50.1 last month and calcium was also normal at 9.2 at that time - At today's visit, we will check her ionized calcium, PTH, and 24-hour urine calcium to gauge the need for calcium supplementation - I will see the patient back in 6 months  2. Reactive hypoglycemia -Developed after gastric bypass -She previously had hypoglycemia with sugars as low as 35, but not recently -We discussed that malabsorptive gastric bypass surgeries carry a risk of hypoglycemia and developed after meals and dietary changes are necessary -Discussed about different techniques to avoid low blood sugars after meals to include: Eat lower glycemic index/load foods: [email or web address] Eat solids rather than liquids or semiliquid meals Have 3 meals a day + as needed snacks Eat as much fruit and veggies as you can. Berries, pears, apples,  peaches, apricots  - are the best Do not drink liquids with a meal, separate them by at least 30 min Start the meal with protein and fat and end with carbs Do not drink any sweet drinks Carry a snack with you everywhere. Best - to contain ~15 g of carbs -We also discussed about possibly using GLP-1 receptor agonists to help with weight loss.  I advised her that this class of medication is not contraindicated in the setting of reactive hypoglycemia and we occasionally use GLP-1 receptor agonist to help with this condition.  I definitely advised her to try this and see how she feels.  If sugars are dropping more afterwards, she may need to stop the medication.  She does have a history of nausea so this may be an impediment.  I recommended to see a nutritionist and design a meal plan that could work for her.  She tells me she was given a diet by the weight management clinic, but she cannot eat the amounts recommended.  3. Postsurgical hypothyroidism - latest thyroid labs reviewed with pt. >> normal: Lab Results  Component Value Date   TSH 1.040 02/20/2022  - she continues on LT4 112 mcg daily - pt feels good on this dose, but does have fatigue and weight gain - we discussed about taking the thyroid hormone every day, with water, >30 minutes before breakfast, separated by >4 hours from acid reflux medications, calcium, iron, multivitamins. Pt. is taking it correctly.  - Total time spent for the visit: 1 hour, in pre- and post-charting, obtaining medical information from the chart, reviewing her  previous labs, imaging evaluations, and treatments, reviewing her symptoms, counseling her about her endocrine conditions (please see the discussed topics above), and developing a plan to further investigate and treat them; she had a number of questions which I addressed.  Component     Latest Ref Rng 03/15/2022  PTH, Intact     15 - 65 pg/mL 51   Calcium Ionized     4.7 - 5.5 mg/dL 4.8   PTH is normal  normal, after normalization of her vitamin D level.   24-hour urine  calcium level pending. Component     Latest Ref Rng 03/19/2022  Creatinine, 24H Ur     0.50 - 2.15 g/24 h 1.11   Calcium, 24H Urine     mg/24 h 13 (L)     Urinary calcium is low.  In this case, despite the normal ionized calcium and PTH, we need calcium supplementation.  I will advise her to start daily either 300 or 500 mg of calcium citrate, which ever she can find (will try not to use calcium carbonate since she may use PPIs-has Protonix at hand).  Philemon Kingdom, MD PhD [facility] Endocrinology

## 2022-03-16 LAB — PARATHYROID HORMONE, INTACT (NO CA): PTH: 51 pg/mL (ref 15–65)

## 2022-03-16 LAB — CALCIUM, IONIZED: Calcium, Ion: 4.8 mg/dL (ref 4.7–5.5)

## 2022-03-16 NOTE — Telephone Encounter (Signed)
Victoria [name] called back and does not know what she is going to do yet about surgery, she will wait and decide after she talks with Dr Ninfa Linden on 03/28/2022

## 2022-03-16 NOTE — Telephone Encounter (Signed)
LVM to what DR Baxley's response was.

## 2022-03-19 ENCOUNTER — Other Ambulatory Visit (HOSPITAL_COMMUNITY): Payer: Self-pay

## 2022-03-19 ENCOUNTER — Ambulatory Visit (INDEPENDENT_AMBULATORY_CARE_PROVIDER_SITE_OTHER): Payer: 59 | Admitting: Internal Medicine

## 2022-03-19 ENCOUNTER — Encounter (INDEPENDENT_AMBULATORY_CARE_PROVIDER_SITE_OTHER): Payer: Self-pay | Admitting: Internal Medicine

## 2022-03-19 ENCOUNTER — Other Ambulatory Visit: Payer: Self-pay | Admitting: Family

## 2022-03-19 ENCOUNTER — Other Ambulatory Visit: Payer: 59

## 2022-03-19 VITALS — BP 116/72 | HR 60 | Temp 97.6°F | Ht 67.0 in | Wt 273.0 lb

## 2022-03-19 DIAGNOSIS — E161 Other hypoglycemia: Secondary | ICD-10-CM

## 2022-03-19 DIAGNOSIS — E669 Obesity, unspecified: Secondary | ICD-10-CM

## 2022-03-19 DIAGNOSIS — Z6841 Body Mass Index (BMI) 40.0 and over, adult: Secondary | ICD-10-CM | POA: Diagnosis not present

## 2022-03-19 DIAGNOSIS — D508 Other iron deficiency anemias: Secondary | ICD-10-CM

## 2022-03-19 DIAGNOSIS — E559 Vitamin D deficiency, unspecified: Secondary | ICD-10-CM

## 2022-03-19 DIAGNOSIS — Z9884 Bariatric surgery status: Secondary | ICD-10-CM | POA: Diagnosis not present

## 2022-03-19 DIAGNOSIS — E211 Secondary hyperparathyroidism, not elsewhere classified: Secondary | ICD-10-CM

## 2022-03-19 DIAGNOSIS — G4733 Obstructive sleep apnea (adult) (pediatric): Secondary | ICD-10-CM

## 2022-03-19 MED ORDER — ERGOCALCIFEROL 1.25 MG (50000 UT) PO CAPS
50000.0000 [IU] | ORAL_CAPSULE | ORAL | 1 refills | Status: DC
  Filled 2022-03-19: qty 8, 28d supply, fill #0
  Filled 2022-05-31: qty 8, 28d supply, fill #1

## 2022-03-19 NOTE — Progress Notes (Unsigned)
Chief Complaint:   OBESITY Victoria [name] is here to discuss her progress with her obesity treatment plan along with follow-up of her obesity related diagnoses. Victoria [name] is on the Category 1 Plan and states she is following her eating plan approximately 0% of the time. Elyssa states she is doing 0 minutes 0 times per week.  Today's visit was #: 8 Starting weight: 275 lbs Starting date: 11/20/2021 Today's weight: 273 lbs Today's date: 03/19/2022 Total lbs lost to date: 2 Total lbs lost since last in-office visit: 0  Interim History: Victoria [name] presents today for follow-up.  I have not seen her since November.  She has gained 11 pounds.  She states that losing weight is very difficult and sometimes she wants to give up.  She inquires about pharmacotherapy.  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  She tends to skip meals and has been relying on protein shakes but also acknowledges snacking on unhealthy foods throughout the day.  She also craves candies.  She reports having increased cravings for sweets.  She is also having side effects to Premier and fair life protein shakes.  The seem to upset her stomach.  She is also had problems with post bariatric hypoglycemia when she consumes simple carbs.  Subjective:   1. Vitamin D deficiency Most recent levels were 50.  Levels have been replenished on twice a week high-dose vitamin D.    2. Other hypoglycemia-post bariatric Symptoms seem to fluctuate it depends on what she eats.    3. Obstructive sleep apnea She reports good compliance with CPAP therapy.    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  Counseled on the pillars of weight management and the importance of working on nutrition, behavioral, physical activity before entertaining pharmacotherapy.    Assessment/Plan:   1. Vitamin D deficiency I recommend rechecking it in 3 months to avoid oversupplementation.  She denies any  adverse effects.  - ergocalciferol (VITAMIN D2) 1.25 MG (50000 UT) capsule; Take 1 capsule (50,000 Units total) by mouth 2 (two) times a week for Vitamin deficiency and gastric bypass.  Dispense: 8 capsule; Refill: 1  2. Other hypoglycemia-post bariatric We again counseled her on avoiding simple sugars which is what precipitates the postprandial hyperglycemia.  3. Obstructive sleep apnea Continue with weight loss therapy and PAP therapy.  4. History of Roux-en-Y gastric bypass She is interested in GLP-1 therapy which I do not think she would tolerate.  5. Obesity with current BMI of 42.9 Patient provided with a list of other protein shakes to try.  She is to make a concerted effort to either consume fruit or vegetables with protein shake as these are not meal replacements.  Because of her gastric bypass she has problems getting in prescribed nutrition via some solid foods.  Animal protein does not appeal to her.  We discussed other sources of protein.Counseled on the pillars of weight management and the importance of working on nutrition, behavioral, physical activity before entertaining pharmacotherapy.  She is interested in GLP-1 therapy so we reviewed the benefits and potential risk of medications.   [name] is currently in the action stage of change. As such, her goal is to continue with weight loss efforts. She has agreed to the Category 1 Plan.   Exercise goals: No exercise has been prescribed at this time.  Behavioral modification strategies: increasing lean protein intake, meal planning and cooking strategies, keeping healthy  foods in the home, and planning for success.  [name] has agreed to follow-up with our clinic in 3 weeks. She was informed of the importance of frequent follow-up visits to maximize her success with intensive lifestyle modifications for her multiple health conditions.   Objective:   Blood pressure 116/72, pulse 60, temperature 97.6 F (36.4 C), height '5\' 7"'$   (1.702 m), weight 273 lb (123.8 kg), SpO2 98 %. Body mass index is 42.76 kg/m.  General: Cooperative, alert, well developed, in no acute distress. HEENT: Conjunctivae and lids unremarkable. Cardiovascular: Regular rhythm.  Lungs: Normal work of breathing. Neurologic: No focal deficits.   Lab Results  Component Value Date   CREATININE 0.96 02/20/2022   BUN 15 02/20/2022   NA 143 02/20/2022   K 4.3 02/20/2022   CL 105 02/20/2022   CO2 22 02/20/2022   Lab Results  Component Value Date   ALT 33 (H) 02/20/2022   AST 21 02/20/2022   ALKPHOS 70 02/20/2022   BILITOT 0.3 02/20/2022   Lab Results  Component Value Date   HGBA1C 5.8 (H) 02/20/2022   HGBA1C 5.7 (H) 11/20/2021   HGBA1C 5.4 06/30/2020   HGBA1C 5.7 (H) 05/16/2020   HGBA1C 5.5 03/15/2017   Lab Results  Component Value Date   INSULIN 5.4 02/20/2022   INSULIN 10.0 11/20/2021   INSULIN 6.2 07/07/2020   INSULIN 4 09/16/2015   Lab Results  Component Value Date   TSH 1.040 02/20/2022   Lab Results  Component Value Date   CHOL 191 02/20/2022   HDL 84 02/20/2022   LDLCALC 98 02/20/2022   TRIG 43 02/20/2022   CHOLHDL 2.8 01/04/2022   Lab Results  Component Value Date   VD25OH 50.1 02/20/2022   VD25OH 29.4 (L) 11/20/2021   VD25OH 18.9 (L) 07/07/2020   Lab Results  Component Value Date   WBC 5.2 01/12/2022   HGB 12.8 01/12/2022   HCT 38.4 01/12/2022   MCV 98.2 01/12/2022   PLT 219 01/12/2022   Lab Results  Component Value Date   IRON 123 01/12/2022   TIBC 343 01/12/2022   FERRITIN 21 01/12/2022   Attestation Statements:   Reviewed by clinician on day of visit: allergies, medications, problem list, medical history, surgical history, family history, social history, and previous encounter notes.   Wilhemena Durie, am acting as transcriptionist for Thomes Dinning, MD.  I have reviewed the above documentation for accuracy and completeness, and I agree with the above. -Thomes Dinning, MD

## 2022-03-20 ENCOUNTER — Other Ambulatory Visit: Payer: Self-pay | Admitting: Internal Medicine

## 2022-03-20 LAB — CALCIUM, URINE, 24 HOUR: Calcium, 24H Urine: 13 mg/24 h — ABNORMAL LOW

## 2022-03-20 LAB — CREATININE, URINE, 24 HOUR: Creatinine, 24H Ur: 1.11 g/(24.h) (ref 0.50–2.15)

## 2022-03-21 ENCOUNTER — Other Ambulatory Visit (HOSPITAL_COMMUNITY): Payer: Self-pay

## 2022-03-21 ENCOUNTER — Other Ambulatory Visit: Payer: Self-pay

## 2022-03-21 MED ORDER — PROMETHAZINE HCL 25 MG PO TABS
25.0000 mg | ORAL_TABLET | Freq: Three times a day (TID) | ORAL | 5 refills | Status: DC | PRN
  Filled 2022-03-21: qty 30, 10d supply, fill #0
  Filled 2022-05-01: qty 30, 10d supply, fill #1
  Filled 2022-05-31: qty 30, 10d supply, fill #2
  Filled 2022-08-06: qty 30, 10d supply, fill #3

## 2022-03-26 ENCOUNTER — Ambulatory Visit: Payer: 59 | Admitting: Physician Assistant

## 2022-03-26 ENCOUNTER — Other Ambulatory Visit (HOSPITAL_COMMUNITY): Payer: Self-pay

## 2022-03-27 ENCOUNTER — Other Ambulatory Visit (HOSPITAL_COMMUNITY): Payer: Self-pay

## 2022-03-28 ENCOUNTER — Telehealth: Payer: Self-pay | Admitting: Neurology

## 2022-03-28 ENCOUNTER — Ambulatory Visit: Payer: 59 | Admitting: Orthopaedic Surgery

## 2022-03-28 ENCOUNTER — Encounter: Payer: Self-pay | Admitting: Family

## 2022-03-28 ENCOUNTER — Other Ambulatory Visit: Payer: Self-pay | Admitting: Pharmacist

## 2022-03-28 ENCOUNTER — Other Ambulatory Visit (HOSPITAL_COMMUNITY): Payer: Self-pay

## 2022-03-28 ENCOUNTER — Telehealth (INDEPENDENT_AMBULATORY_CARE_PROVIDER_SITE_OTHER): Payer: 59 | Admitting: Neurology

## 2022-03-28 ENCOUNTER — Encounter: Payer: Self-pay | Admitting: Orthopaedic Surgery

## 2022-03-28 DIAGNOSIS — G5622 Lesion of ulnar nerve, left upper limb: Secondary | ICD-10-CM | POA: Diagnosis not present

## 2022-03-28 DIAGNOSIS — G8929 Other chronic pain: Secondary | ICD-10-CM

## 2022-03-28 DIAGNOSIS — M25512 Pain in left shoulder: Secondary | ICD-10-CM | POA: Diagnosis not present

## 2022-03-28 DIAGNOSIS — G43709 Chronic migraine without aura, not intractable, without status migrainosus: Secondary | ICD-10-CM

## 2022-03-28 MED ORDER — EMGALITY 120 MG/ML ~~LOC~~ SOAJ
240.0000 mg | SUBCUTANEOUS | 0 refills | Status: DC
  Filled 2022-03-28: qty 2, fill #0
  Filled 2022-03-28: qty 2, 30d supply, fill #0

## 2022-03-28 MED ORDER — HUMIRA (2 PEN) 40 MG/0.8ML ~~LOC~~ AJKT
AUTO-INJECTOR | SUBCUTANEOUS | 5 refills | Status: DC
  Filled 2022-03-28: qty 4, 28d supply, fill #0
  Filled 2022-04-23: qty 4, 28d supply, fill #1
  Filled 2022-05-10: qty 4, 28d supply, fill #2
  Filled 2022-06-12: qty 4, 28d supply, fill #3
  Filled 2022-07-17: qty 4, 28d supply, fill #4
  Filled 2022-08-15: qty 4, 28d supply, fill #5

## 2022-03-28 MED ORDER — HUMIRA (2 PEN) 40 MG/0.8ML ~~LOC~~ AJKT: AUTO-INJECTOR | SUBCUTANEOUS | 5 refills | Status: DC

## 2022-03-28 MED ORDER — EMGALITY 120 MG/ML ~~LOC~~ SOAJ
120.0000 mg | SUBCUTANEOUS | 11 refills | Status: DC
  Filled 2022-03-28: qty 2, fill #0
  Filled 2022-04-30: qty 2, 60d supply, fill #0
  Filled 2022-05-08: qty 2, 30d supply, fill #0
  Filled 2022-05-31 – 2022-06-09 (×2): qty 2, 30d supply, fill #1
  Filled 2022-06-12: qty 1, 30d supply, fill #1

## 2022-03-28 NOTE — Progress Notes (Signed)
The patient comes in today to go over MRI of her left shoulder.  She been dealing with chronic left shoulder pain and issues and we did place a steroid injection in her left shoulder subacromial aloe which helped minimally.  She still feels a "pulling sensation" and fullness in that left shoulder.  There is been no weakness or injury.  We have been also following her for knee arthritis.  She also has known cubital tunnel syndrome that is moderate of her left upper extremity that was evaluated on clinical exam and EMG findings by Dr. Ernestina Patches.  The MRI of her left shoulder is reviewed with her and it shows some moderate arthropathy of the glenohumeral joint.  There is only minimal tendinosis of the rotator cuff and no tear.  There is no muscle atrophy.  There is minimal arthritis of the [facility] joint.  All of the remaining anatomy and the shoulder looks pretty normal other than the arthritic changes and a small effusion.  On exam there is no blocks or rotation of her left shoulder and it moves smoothly and fluidly with some pain deep in the shoulder itself.  The rotator cuff is strong.  I would like to send her to Dr. Elba Barman one of my partners for an intra-articular assessment of her left shoulder under ultrasound with potentially aspiration of effusion and placing a steroid injection in that joint.  While she is seeing him, I would like him to also evaluate her cubital tunnel to see if there is anything as he would recommend other than a release of the ulnar nerve.  Once he sees her, he can get her back to me 3 to 4 weeks later.  She agrees with this treatment plan.

## 2022-03-28 NOTE — Progress Notes (Signed)
[facility]    Provider:  Dr Jaynee Eagles Requesting Provider: Renold Genta Cresenciano Lick, MD Primary Care Provider:  Elby Showers, MD  CC:  migraines, dizziness  Virtual Visit via Video Note  I connected with [name] on 03/28/2022 at 11:00 AM EST by a video enabled telemedicine application and verified that I am speaking with the correct person using two identifiers.  Location: Patient: home Provider: office   I discussed the limitations of evaluation and management by telemedicine and the availability of in person appointments. The patient expressed understanding and agreed to proceed.  Follow Up Instructions:    I discussed the assessment and treatment plan with the patient. The patient was provided an opportunity to ask questions and all were answered. The patient agreed with the plan and demonstrated an understanding of the instructions.   The patient was advised to call back or seek an in-person evaluation if the symptoms worsen or if the condition fails to improve as anticipated.  I provided 30 minutes of non-face-to-face time during this encounter.   Melvenia Beam, MD   March 28, 2022: Patient is here for follow-up of migraines, at last appointment we started her on Qulipta which was approved. Qulipta upset her stomach couldn't tolerate it. Ubrelvy not working either she has to take 2. Will try nasal spray next zavapret if ubrelvy not working in combination with emgality. She has mild sleep apnea and wearing cpap. Start emgality first month 2 injections every month thereafter is one injection. She is having >8 migraine days a month and > 15 headache days a month for last 4 months.   Tried: gabapentin, labetalol, nurtec, Ajovy, qulipta, ubrelvy, metoprolol, imitrex, maxalt, amitriptyline, topamax  Patient complains of symptoms per HPI as well as the following symptoms: migraines . Pertinent negatives and positives per HPI. All others  negative   12/26/2021: Patient with severe low back pain and radicular symptoms with concerning symptoms, weakness of the lower extremities, absent reflexes, MRI lumbar spine was essential normal as was the emg/ncs. . We have seen her in the past for migraines and also numbness/tingling in the feet. EMG/NCS normal 05/2020 of upper and lower. Today she is here for refill on her nurtec. Dizzy especially when walking. She has migraines about 6 migraine days a month and 10 total headache days a month. Nurtec doesn't help as much as it used to. She has been etting lightheaded with exercise more lightheadedness. Feeling like she may pass out. She doesn't drink enough fluids in a day. She has not been feeling well, more stress at work, not drinking enough fluids.   Tried: gabapentin, labetalol, nurtec, Ajovy, qulipta, ubrelvy, metoprolol, imitrex, maxalt, amitriptyline, topamax  Patient complains of symptoms per HPI as well as the following symptoms: dizziness . Pertinent negatives and positives per HPI. All others negative   HPI 06/04/2019:  Kerriann Kamphuis is a 62 y.o. female here as requested by Elby Showers, MD for numbness in the feet. PMHx migraines, chiari malformation, vitamin B12 and vitamin D deficiency, post gastrectomy syndrome, pernicious anemia, osteoarthritis, hypothyroidism, C. difficile, palpitations, headache, degenerative disease of the low back, chronic leukopenia.  She has back pain and it radiates down the left leg. She has had injections in her back, she has been under the care of him for over a year, she has tried all sorts of conservative measures, se has been to PT. She had a hip revision and knee problems but this is not coming  from hip or knee and xray of the knee is fine, Starts in the low back raves behind the leg and down to the toes on the left. Now the right is giving her trouble and radiating to her buttocks and down the leg. She is having weakness in the legs. She  can't exercise. She is having urinary changes,.pain is severe, shooting are severe, waking her up at night, she is trying analgesics, muscle relaxants, gabapentin. No other focal neurologic deficits, associated symptoms, inciting events or modifiable factors.  Reviewed notes, labs and imaging from outside physicians, which showed:  Personally reviewed images MRI cervical spine and agree with the following 2020: MRI cervical spine (without) demonstrating: - Mild degenerative changes from C3-4 down to C6-7. - No spinal stenosis or foraminal narrowing  MRI brain was unremarkable  B12 398 B1 9 Folate nml  Reviewed MRI report 03/06/2006 L1-2:  Mild facet overgrowth.  No central or foraminal stenosis. L2-3:  Mild bilateral facet overgrowth.  No significant stenosis.   L3-4:  Minimal facet overgrowth. L4-5:  Mild facet overgrowth is present.  A small synovial cyst is present posterior to the left facet.  This is not in a position to cause nerve impingement.   L5-S1:  Left excentric disc bulge noted.  Facet overgrowth noted.  There is mild left foraminal stenosis. IMPRESSION:   1.  Mild left foraminal stenosis at L5-S1, predominantly due to facet overgrowth as shown on image # 10 of series 4.   Review of Systems: Patient complains of symptoms per HPI as well as the following symptoms: severe leg pain, low back pain, weakness. Pertinent negatives and positives per HPI. All others negative.   Social History   Socioeconomic History   Marital status: Married    Spouse name: Barbaraann Rondo   Number of children: 2   Years of education: Not on file   Highest education level: Not on file  Occupational History   Occupation: Economist GI    Employer: [employer]  Tobacco Use   Smoking status: Never   Smokeless tobacco: Never  Vaping Use   Vaping Use: Never used  Substance and Sexual Activity   Alcohol use: No   Drug use: No   Sexual activity: Not Currently    Partners: Male    Birth  control/protection: Surgical    Comment: hysterectomy  Other Topics Concern   Not on file  Social History Narrative   Lives at home with spouse   Right handed   Caffeine: diet zero mtn dew, occasionally    Social Determinants of Health   Financial Resource Strain: Not on file  Food Insecurity: Not on file  Transportation Needs: Not on file  Physical Activity: Not on file  Stress: Not on file  Social Connections: Not on file  Intimate Partner Violence: Not on file    Family History  Problem Relation Age of Onset   High blood pressure Mother    Hypertension Mother    Headache Mother    High Cholesterol Mother    Thyroid disease Mother    Obesity Mother    Kidney disease Mother    Hypertension Father    Diabetes Father    High blood pressure Father    High Cholesterol Father    Heart disease Father    Obesity Father    Diabetes Sister    Hypertension Sister    Thyroid disease Brother    Thyroid disease Maternal Aunt    Migraines Neg Hx  Past Medical History:  Diagnosis Date   Back pain    Bursitis    right shoulder   Chronic leukopenia    intermittant since 2010, followed by Dr. Renold Genta now   Chronic neck pain    Constipation    Degenerative joint disease of low back 09/02/2015   Dr. Maureen Ralphs   Displacement of lumbar intervertebral disc    Edema of both lower legs    Fibrocystic breast    Gallbladder problem    GERD (gastroesophageal reflux disease)    Headache    per pt more stress/tension   Heart palpitations    SEE EPIC ENCOUTNER , CARDIOLOGY DR. Tressia Miners TURNER 2018; reports on 05-16-17 " i haven't had any bouts of those lately"     Hemorrhoids    Hidradenitis suppurativa    History of Clostridium difficile infection 12/2010   History of exercise intolerance    ETT on 04-27-2016-- negative ([facility] treadmill score 7)   History of Helicobacter pylori infection 2001 and 09/ 2012   HSV-2 infection    genital   Hx of adenomatous colonic polyps 10/17/2007    Hydradenitis    per pt currently treated with Humira injection   Hyperthyroidism    Hypocupremia    Hypothyroidism    Hypothyroidism, postsurgical 1980   IBS (irritable bowel syndrome)    Insomnia    Iron deficiency    Joint pain    Leukopenia    Lumbosacral radiculopathy    Medial meniscus tear    right knee   Mild obstructive sleep apnea    Mitral regurgitation    Mild to moderate by echo 09/2021   Numbness and tingling of right arm    Obesity    Osteoarthritis    Palpitations    Pernicious anemia    b12 def   Peroneal neuropathy    PONV (postoperative nausea and vomiting)    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   Prediabetes    Reactive hypoglycemia followed by pcp   post gastrectomy dumping syndrome   SOB (shortness of breath)    Spinal stenosis    Tendonitis    right shoulder   Varicose veins    Vertigo    Vitamin B 12 deficiency    Vitamin D deficiency     Patient Active Problem List   Diagnosis Date Noted   Chronic migraine without aura without status migrainosus, not intractable 12/26/2021   Depressed mood 12/19/2021   Obstructive sleep apnea 12/19/2021   Other hypoglycemia-post bariatric 12/05/2021   Secondary hyperparathyroidism ([place]) 12/05/2021   Inadequate fluid intake 12/05/2021   Inadequate sleep hygiene 12/05/2021   Class 3 severe obesity with body mass index (BMI) of 40.0 to 44.9 in adult ([place]) 12/05/2021   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   SOBOE (shortness of breath on exertion) 11/21/2021   Other fatigue 11/21/2021   Vitamin D deficiency 11/21/2021   Iron deficiency 11/21/2021   Hypoglycemia after bariatric surgery 11/21/2021   Depression screening 11/21/2021   Class 3 severe obesity with serious comorbidity and body mass index (BMI) of 40.0 to 44.9 in adult ([place]) 11/21/2021   Mild obstructive sleep apnea 11/17/2021   Vertigo 11/17/2021   Excessive daytime sleepiness 10/23/2021   Spinal stenosis of lumbar region  07/07/2020   Unilateral primary osteoarthritis, right knee 06/08/2020   Displacement of lumbar intervertebral disc 06/02/2020   Lumbar spondylosis 06/02/2020   Lumbar stenosis with neurogenic claudication 06/02/2020   Arm numbness 05/30/2020   Chronic  neck pain 05/30/2020   Other chronic pain 05/30/2020   Elevated blood-pressure reading, without diagnosis of hypertension 05/30/2020   Status post arthroscopy of right knee 12/11/2019   Polyethylene wear of left knee joint prosthesis ([place]) 09/09/2019   Acute medial meniscal tear, right, subsequent encounter 08/27/2019   History of total left knee replacement 07/16/2019   Peroneal neuropathy at knee, left 06/25/2019   Lumbosacral radiculopathy 06/07/2019   Iron malabsorption 10/16/2018   IDA (iron deficiency anemia) 10/16/2018   OA (osteoarthritis) of knee 01/20/2018   Acute meniscal tear, medial 07/17/2017   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   Failed total hip arthroplasty, 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/2017   Pain in left knee 04/11/2017   Insomnia 11/08/2016   Pernicious anemia 11/08/2016   Leukopenia 07/09/2016   Hypocupremia 07/09/2016   Palpitations 04/12/2016   Shortness of breath 04/12/2016   Reactive hypoglycemia 10/31/2015   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/31/2015   Lap gastric bypass May 2016 07/06/2014   History of Clostridium difficile infection 08/12/2011   Post-surgical hypothyroidism 08/26/2010   Gastroesophageal reflux disease 08/26/2010   Vitamin D deficiency 08/26/2010   Fibrocystic disease of breast 08/26/2010   Cobalamin deficiency 08/26/2010   Morbid obesity ([place]) 03/20/2010   Backache 03/20/2010    Past Surgical History:  Procedure Laterality Date   BREATH TEK H PYLORI N/A 05/03/2014   Procedure: BREATH TEK H PYLORI;  Surgeon: Kaylyn Lim, MD;  Location: [facility];  Service: General;  Laterality: N/A;   CARDIOVASCULAR STRESS TEST  03/11/2013   dr Tressia Miners turner   Low risk nuclear study w/ a very small  anterior perfusion defect that most likely represents breast attenuation artifact, less likely this could represent a true perfusion abnormality in the distribution of diagonal branch of the LAD/  normal LV function and wall motion , ef 66%   CATARACT EXTRACTION W/ INTRAOCULAR LENS  IMPLANT, BILATERAL  10/2017   [place]   COLONOSCOPY  02/2017   Dr [name] ; per patient they found 7 polyps with polypectomy; on 5 year track    ESOPHAGOGASTRODUODENOSCOPY  02/01/2021   FINE NEEDLE ASPIRATION Left 05/22/2017   Procedure: LEFT KNEE ASPIRATION;  Surgeon: Gaynelle Arabian, MD;  Location: [facility];  Service: Orthopedics;  Laterality: Left;   GASTRIC ROUX-EN-Y N/A 07/06/2014   Procedure: LAPAROSCOPIC ROUX-EN-Y GASTRIC BYPASS, ENTEROLYSIS OF ADHESIONS WITH UPPER ENDOSCOPY;  Surgeon: Johnathan Hausen, MD;  Location: [facility];  Service: General;  Laterality: N/A;   HAMMER TOE SURGERY Bilateral 02/17/2008   bilateral foot digit's 2 and 5   HIATAL HERNIA REPAIR N/A 07/06/2014   Procedure: LAPAROSCOPIC REPAIR OF HIATAL HERNIA;  Surgeon: Johnathan Hausen, MD;  Location: [facility];  Service: General;  Laterality: N/A;   I & D KNEE WITH POLY EXCHANGE Left 12/11/2019   Procedure: LEFT KNEE POLY-LINER EXCHANGE;  Surgeon: Mcarthur Rossetti, MD;  Location: [place];  Service: Orthopedics;  Laterality: Left;   KNEE ARTHROSCOPY Right 12/11/2019   Procedure: RIGHT KNEE ARTHROSCOPY WITH PARTIAL MEDIAL MENISCECTOMY;  Surgeon: Mcarthur Rossetti, MD;  Location: [place];  Service: Orthopedics;  Laterality: Right;   KNEE ARTHROSCOPY WITH MEDIAL MENISECTOMY Left 07/17/2017   Procedure: LEFT KNEE ARTHROSCOPY WITH MEDIAL LATERAL MENISECTOMY;  Surgeon: Gaynelle Arabian, MD;  Location: [facility];  Service: Orthopedics;  Laterality: Left;   MASS EXCISION N/A 10/11/2016   Procedure: EXCISION OF PERIANAL MASS;  Surgeon: [name], MD;  [facility]  Service: General;  Laterality: N/A;   REFRACTIVE SURGERY     THYROIDECTOMY  1980   TOTAL ABDOMINAL HYSTERECTOMY  08-25-2002   dr Margaretha Glassing   ovaries retained   TOTAL HIP ARTHROPLASTY Right 05/21/2012   Procedure: TOTAL HIP ARTHROPLASTY;  Surgeon: Gearlean Alf, MD;  Location: [facility];  Service: Orthopedics;  Laterality: Right;   TOTAL HIP ARTHROPLASTY Left 01-31-2009  dr Wynelle Link   TOTAL HIP REVISION Left 05/22/2017   Procedure: Left hip bearing surface and head revision;  Surgeon: Gaynelle Arabian, MD;  Location: [facility];  Service: Orthopedics;  Laterality: Left;   TOTAL KNEE ARTHROPLASTY Left 01/20/2018   Procedure: LEFT TOTAL KNEE ARTHROPLASTY;  Surgeon: Gaynelle Arabian, MD;  Location: [facility];  Service: Orthopedics;  Laterality: Left;  6mn   TRANSTHORACIC ECHOCARDIOGRAM  05/03/2016   ef 55-60%/  trivial MR/  mild TR   TUBAL LIGATION      Current Outpatient Medications  Medication Sig Dispense Refill   Galcanezumab-gnlm (EMGALITY) 120 MG/ML SOAJ Inject 240 mg into the skin once for 1 dose. First month only 2 injections. Thereafter one injection monthly. 2 mL 0   Galcanezumab-gnlm (EMGALITY) 120 MG/ML SOAJ Inject 120 mg into the skin every 30 (thirty) days. 1.12 mL 11   Adalimumab (HUMIRA, 2 PEN,) 40 MG/0.8ML PNKT Inject 1 pen subcutaneously every week 4 each 5   albuterol (VENTOLIN HFA) 108 (90 Base) MCG/ACT inhaler Inhale 2 puffs into the lungs every 4 hours as needed for wheezing or shortness of breath. 18 g 3   Atogepant (QULIPTA) 60 MG TABS Take 60 mg by mouth daily. 30 tablet 11   Calcium Carb-Cholecalciferol (CALCIUM + D3 PO) Take 1 tablet by mouth daily.     celecoxib (CELEBREX) 200 MG capsule Take 1 capsule (200 mg total) by mouth 2 (two) times daily as needed. 60 capsule 3   clobetasol ointment (TEMOVATE) 0.05 % Apply to skin twice daily as directed for up to 2 weeks as needed 60 g 0   cyanocobalamin (DODEX) 1000 MCG/ML injection INJECT 1 ML INTRAMUSCULARLY EVERY MONTH 3 mL 11   ergocalciferol  (VITAMIN D2) 1.25 MG (50000 UT) capsule Take 1 capsule (50,000 Units total) by mouth 2 (two) times a week for Vitamin deficiency and gastric bypass. 8 capsule 1   escitalopram (LEXAPRO) 10 MG tablet Take 1 tablet (10 mg total) by mouth daily. 90 tablet 1   Estradiol (DIVIGEL) 1.25 MG/1.25GM GEL Use one packet daily as directed 112.5 g 3   flecainide (TAMBOCOR) 50 MG tablet Take 1 tablet by mouth 2 times daily. 180 tablet 3   gabapentin (NEURONTIN) 600 MG tablet Take 1 tablet (600 mg total) by mouth 3 (three) times daily. 90 tablet 5   levocetirizine (XYZAL) 5 MG tablet Take 5 mg by mouth every evening.     levothyroxine (SYNTHROID) 112 MCG tablet Take 1 tablet (112 mcg total) by mouth daily. 90 tablet 1   LORazepam (ATIVAN) 2 MG tablet Take 1 tablet (2 mg total) by mouth at bedtime. 30 tablet 2   metoprolol succinate (TOPROL-XL) 25 MG 24 hr tablet TAKE 1 TABLET BY MOUTH DAILY. 90 tablet 3   montelukast (SINGULAIR) 10 MG tablet Take 1 tablet by mouth at bedtime. 30 tablet 3   Olopatadine HCl 0.2 % SOLN Apply 1 drop to eye daily as needed. 2.5 mL 5   pantoprazole (PROTONIX) 40 MG tablet Take 1 tablet by mouth daily 20 minutes before breakfast 90 tablet 3   Prenatal Vit-Fe Fumarate-FA (PRENATAL  MULTIVITAMIN) TABS tablet Take 1 tablet by mouth 2 (two) times daily with a meal.     promethazine (PHENERGAN) 25 MG tablet Take 1 tablet (25 mg total) by mouth every 8 (eight) hours as needed for N/V. 30 tablet 5   sulfamethoxazole-trimethoprim (BACTRIM DS) 800-160 MG tablet Take 1 tablet twice daily as needed for flare. 60 tablet 1   terconazole (TERAZOL 7) 0.4 % vaginal cream Place 1 applicatorful vaginally at bedtime for seven nights.   Then use one applicatorful once a week for 6 months. 45 g 4   tiZANidine (ZANAFLEX) 4 MG capsule Take 1 capsule (4 mg total) by mouth 3 (three) times daily as needed for muscle spasms. 90 capsule 1   Ubrogepant (UBRELVY) 100 MG TABS Take 100 mg by mouth every 2 (two) hours as  needed. Maximum '200mg'$  a day. 16 tablet 11   No current facility-administered medications for this visit.   Facility-Administered Medications Ordered in Other Visits  Medication Dose Route Frequency Provider Last Rate Last Admin   gadobenate dimeglumine (MULTIHANCE) injection 20 mL  20 mL Intravenous Once PRN Melvenia Beam, MD        Allergies as of 03/28/2022 - Review Complete 03/28/2022  Allergen Reaction Noted   Other Other (See Comments) 02/11/2018   Estradiol Rash and Other (See Comments) 03/12/2016    Vitals: There were no vitals taken for this visit. Last Weight:  Wt Readings from Last 1 Encounters:  03/19/22 273 lb (123.8 kg)   Last Height:   Ht Readings from Last 1 Encounters:  03/19/22 '5\' 7"'$  (1.702 m)    Physical exam: Exam: Gen: NAD, conversant      CV: Could not perform over Web Video. Denies palpitations or chest pain or SOB. VS: Breathing at a normal rate. Not febrile. Eyes: Conjunctivae clear without exudates or hemorrhage  Neuro: Detailed Neurologic Exam  Speech:    Speech is normal; fluent and spontaneous with normal comprehension.  Cognition:    The patient is oriented to person, place, and time;     recent and remote memory intact;     language fluent;     normal attention, concentration,     fund of knowledge Cranial Nerves:    The pupils are equal, round, and reactive to light. Cannot perform fundoscopic exam. Visual fields are full. Extraocular movements are intact.  The face is symmetric with normal sensation. The palate elevates in the midline. Hearing intact. Voice is normal. Shoulder shrug is normal. The tongue has normal motion without fasciculations.  Motor Observation:   no involuntary movements noted. Tone:    Appears normal  Posture:    Posture is normal. normal erect    Strength:    Strength is anti-gravity and symmetric in the upper limbs.      Sensation: intact to LT per patient    Assessment/Plan:  Patient with migraines  and dizziness    -Not orthostatic: 123/83 55 140/81 50 148/83 55 at prior visits - Patient is here for follow-up of migraines, at last appointment we started her on Qulipta which was approved. Qulipta upset her stomach couldn't tolerate it. Ubrelvy not working either she has to take 2. Will try nasal spray next zavapret. She has mild sleep apnea and wearing cpap. Start emgality first month 2 injections every month thereafter is one injection. She is having >8 migraine days a month and > 15 headache days a month for last 4 months.   Start emgality After started can retry  Roselyn Meier or try Zavzpret acutely  Tried: gabapentin, labetalol, nurtec, Ajovy, qulipta, ubrelvy, metoprolol, imitrex, maxalt, amitriptyline, topamax, qulipta   Meds ordered this encounter  Medications   Galcanezumab-gnlm (EMGALITY) 120 MG/ML SOAJ    Sig: Inject 240 mg into the skin once for 1 dose. First month only 2 injections. Thereafter one injection monthly.    Dispense:  2 mL    Refill:  0    RX bin [id number] PCN [id number] Group [id number] ID [id number] expires 02/26/2023   Galcanezumab-gnlm (EMGALITY) 120 MG/ML SOAJ    Sig: Inject 120 mg into the skin every 30 (thirty) days.    Dispense:  1.12 mL    Refill:  11    Do not fill until next month, fill loading dose first ('240mg'$ ). RX bin [id number] PCN [id number] Group [id number] ID [id number] expires 02/26/2023.   I spent 40 minutes of face-to-face and non-face-to-face time with patient on the  1. Chronic migraine without aura without status migrainosus, not intractable     diagnosis.  This included previsit chart review, lab review, study review, order entry, electronic health record documentation, patient education on the different diagnostic and therapeutic options, counseling and coordination of care, risks and benefits of management, compliance, or risk factor reduction  Cc: Baxley, Cresenciano Lick, MD,    Sarina Ill, MD  [facility] [address] [place],  [place] [address]  Phone [phone] Fax [phone]

## 2022-03-28 NOTE — Telephone Encounter (Signed)
Scheduled VV for 07/02/22 at 2:00 pm.

## 2022-03-28 NOTE — Telephone Encounter (Signed)
Please call and schedule patient for video follow up 3-4 months with dr Jaynee Eagles

## 2022-03-29 DIAGNOSIS — G4733 Obstructive sleep apnea (adult) (pediatric): Secondary | ICD-10-CM | POA: Diagnosis not present

## 2022-03-30 ENCOUNTER — Encounter: Payer: Self-pay | Admitting: Family

## 2022-03-30 ENCOUNTER — Other Ambulatory Visit (HOSPITAL_COMMUNITY): Payer: Self-pay

## 2022-04-05 ENCOUNTER — Encounter: Payer: Self-pay | Admitting: Sports Medicine

## 2022-04-05 ENCOUNTER — Ambulatory Visit: Payer: 59 | Admitting: Sports Medicine

## 2022-04-05 ENCOUNTER — Ambulatory Visit: Payer: Self-pay

## 2022-04-05 ENCOUNTER — Ambulatory Visit: Payer: 59 | Admitting: Orthopaedic Surgery

## 2022-04-05 DIAGNOSIS — M19012 Primary osteoarthritis, left shoulder: Secondary | ICD-10-CM | POA: Diagnosis not present

## 2022-04-05 DIAGNOSIS — M25512 Pain in left shoulder: Secondary | ICD-10-CM | POA: Diagnosis not present

## 2022-04-05 DIAGNOSIS — G8929 Other chronic pain: Secondary | ICD-10-CM | POA: Diagnosis not present

## 2022-04-05 DIAGNOSIS — G5622 Lesion of ulnar nerve, left upper limb: Secondary | ICD-10-CM

## 2022-04-05 MED ORDER — BUPIVACAINE HCL 0.25 % IJ SOLN
2.0000 mL | INTRAMUSCULAR | Status: AC | PRN
  Administered 2022-04-05: 2 mL via INTRA_ARTICULAR

## 2022-04-05 MED ORDER — LIDOCAINE HCL 1 % IJ SOLN
2.0000 mL | INTRAMUSCULAR | Status: AC | PRN
  Administered 2022-04-05: 2 mL

## 2022-04-05 MED ORDER — METHYLPREDNISOLONE ACETATE 40 MG/ML IJ SUSP
80.0000 mg | INTRAMUSCULAR | Status: AC | PRN
  Administered 2022-04-05: 80 mg via INTRA_ARTICULAR

## 2022-04-05 NOTE — Progress Notes (Signed)
Victoria [name] - 62 y.o. female MRN [id number]  Date of birth: 10/31/1960  Office Visit Note: Visit Date: 04/05/2022 PCP: Elby Showers, MD Referred by: Elby Showers, MD  Subjective: Chief Complaint  Patient presents with   Left Shoulder - Pain   HPI: Victoria [name] is a pleasant 62 y.o. female who presents today for chronic left shoulder pain. Also with left cubital tunnel symptoms with N/T of 4th/5th digit.  She has a chronic left sided shoulder pain.  Did have an MRI with Dr. Ninfa Linden which showed some moderate arthropathy of the glenohumeral joint, the rotator cuff tendon appeared intact.  She did not get much relief from the subacromial joint injection.  He has a pulling sensation in the left shoulder.  She also has a known history of cubital tunnel syndrome on the left.  Has had EMG performed by Dr. Ernestina Patches which showed moderate left ulnar nerve entrapment at the elbow.  She is on gabapentin 600 mg at nighttime only, does think this helps to some extent.  Pertinent ROS were reviewed with the patient and found to be negative unless otherwise specified above in HPI.   Assessment & Plan: Visit Diagnoses:  1. Chronic left shoulder pain   2. Cubital tunnel syndrome on left   3. Primary osteoarthritis, left shoulder    Plan: Discussed with [name] with the limited examination of the left shoulder ultrasound, I do not think there is enough tendon perfusion to warrant aspiration.  Through shared decision-making, did elect to proceed with ultrasound-guided glenohumeral joint injection, patient tolerated well.  She may use over-the-counter anti-inflammatories for any postinjection pain.  In terms of her cubital tunnel, we discussed all treatment options such as avoid provocative flexion of the elbow, sleeping with arm extended in pillow case, nerve medications (gabapentin, lyrica), injection/hydrodissection therapy.  She will try these activity modifications.  She will  continue gabapentin 600 mg nightly, I did discuss increasing this to twice daily taking 300 mg during the day and 600 mg at night.  She is agreeable to trying this, especially on days that she is not working.  If this does not work, we may consider hydrodissection of the cubital tunnel with an injection to see if this improves her pain.  She will give this about a month to see how conservative measures help, and then may follow-up with me as needed.  See Dr. Ninfa Linden back in about 3-4 weeks to discuss shoulder.  Follow-up: F/u in about 1 month to reassess cubital tunnel; may see Dr. Ninfa Linden in next 3-4 weeks for right shoulder  Meds & Orders: No orders of the defined types were placed in this encounter.   Orders Placed This Encounter  Procedures   Large Joint Inj   [place] Extrem Up Left Ltd     Procedures: Large Joint Inj: L glenohumeral on 04/05/2022 4:23 PM Indications: pain Details: 22 G 3.5 in needle, ultrasound-guided posterior approach Medications: 2 mL lidocaine 1 %; 2 mL bupivacaine 0.25 %; 80 mg methylPREDNISolone acetate 40 MG/ML Outcome: tolerated well, no immediate complications  US-guided glenohumeral joint injection, left shoulder After discussion on risks/benefits/indications, informed verbal consent was obtained. A timeout was then performed. The patient was positioned lying lateral recumbent on examination table. The patient's shoulder was prepped with betadine and multiple alcohol swabs and utilizing ultrasound guidance, the patient's glenohumeral joint was identified on ultrasound. Using ultrasound guidance a 22-gauge, 3.5 inch needle with a mixture of 2:2:2 cc's lidocaine:bupivicaine:depomedrol was  directed from a lateral to medial direction via in-plane technique into the glenohumeral joint with visualization of appropriate spread of injectate into the joint. Patient tolerated the procedure well without immediate complications.      Procedure, treatment alternatives, risks  and benefits explained, specific risks discussed. Consent was given by the patient. Immediately prior to procedure a time out was called to verify the correct patient, procedure, equipment, support staff and site/side marked as required. Patient was prepped and draped in the usual sterile fashion.          Clinical History: MRI CERVICAL SPINE WITHOUT CONTRAST   TECHNIQUE: Multiplanar, multisequence MR imaging of the cervical spine was performed. No intravenous contrast was administered.   COMPARISON:  Prior MRI from 01/28/2019.   FINDINGS: Alignment: Straightening of the normal cervical lordosis. No listhesis or malalignment.   Vertebrae: Vertebral body height maintained without acute or chronic fracture. Bone marrow signal intensity within normal limits. Subcentimeter benign hemangioma noted within the C5 vertebral body. No worrisome osseous lesions. No abnormal marrow edema.   Cord: Normal signal and morphology.   Posterior Fossa, vertebral arteries, paraspinal tissues: Unremarkable.   Disc levels:   C2-C3: Unremarkable.   C3-C4: Small left paracentral disc protrusion with annular fissure indents the ventral thecal sac (series 4, image 8). Mild flattening of the ventral cord without cord signal changes. Mild spinal stenosis. Foramina remain patent. This has partially regressed since previous.   C4-C5: Shallow central disc protrusion mildly indents the ventral thecal sac. No significant spinal stenosis. Foramina remain patent. Appearance is relatively stable.   C5-C6: Mild degenerative vertebral disc space narrowing with diffuse disc osteophyte complex. Flattening and partial effacement of the ventral thecal sac. Mild flattening of the ventral cord without cord signal changes. Mild spinal stenosis. Mild left C6 foraminal narrowing. Right neural foramina is patent. Appearance is relatively similar.   C6-C7: Degenerative intervertebral disc space narrowing with  diffuse disc osteophyte complex. Flattening of the ventral thecal sac without significant spinal stenosis. Foramina remain patent. Appearance is relatively similar.   C7-T1: Probable subtle tiny left foraminal disc protrusion (series 1, image 11). Mild facet hypertrophy on the right. No spinal stenosis. Foramina remain patent.   IMPRESSION: 1. Multilevel cervical spondylosis with resultant mild spinal stenosis at C3-4 and C5-6. 2. Mild left C6 foraminal narrowing related to disc bulging and uncovertebral disease. No other significant foraminal encroachment within the cervical spine. 3. Small left paracentral disc protrusion at C3-4, partially regressed since previous. 4. Probable subtle tiny left foraminal disc protrusion at C7-T1, potentially affecting the left C8 nerve root.     Electronically Signed   By: Jeannine Boga M.D.   On: 12/04/2021 05:39  She reports that she has never smoked. She has never used smokeless tobacco.  Recent Labs    11/20/21 1104 02/20/22 0809  HGBA1C 5.7* 5.8*    Objective:     Physical Exam  Gen: Well-appearing, in no acute distress; non-toxic CV: Well-perfused. Warm.  Resp: Breathing unlabored on room air; no wheezing. Psych: Fluid speech in conversation; appropriate affect; normal thought process Neuro: Sensation intact throughout. No gross coordination deficits.   Ortho Exam - Left shoulder: There is relatively well-preserved range of motion in all directions, although some pain at endrange external rotation.  There is no gross weakness with rotator cuff testing.  Negative drop arm test.  - Left elbow: + Tinel's testing at the cubital tunnel.  Full range of motion with elbow flexion and extension.  No soft  tissue swelling.  Imaging: *Independent review of the left shoulder MRI on 03/10/2022 was interpreted by myself.  There is moderate glenohumeral joint arthropathy, with a small joint effusion.  There is some mild tendinopathy at  the supraspinatus/infraspinatus footprint, but no evidence of gross tearing.  MR Shoulder Left w/o contrast CLINICAL DATA:  Chronic left shoulder pain  EXAM: MRI OF THE LEFT SHOULDER WITHOUT CONTRAST  TECHNIQUE: Multiplanar, multisequence MR imaging of the shoulder was performed. No intravenous contrast was administered.  COMPARISON:  Radiographs 01/11/2022  FINDINGS: Rotator cuff: Mild supraspinatus and infraspinatus tendinopathy. No tear observed.  Muscles:  Unremarkable  Biceps long head:  Unremarkable  Acromioclavicular Joint: Mild spurring and subcortical marrow edema compatible with mild degenerative AC joint arthropathy. Type II acromion. Trace subacromial subdeltoid bursitis.  Glenohumeral Joint: Small to moderate glenohumeral joint effusion. Moderate degenerative chondral thinning. Minimal spurring of the humeral head.  Labrum:  Grossly intact  Bones: No significant extra-articular osseous abnormalities identified.  Other: No supplemental non-categorized findings.  IMPRESSION: 1. Mild supraspinatus and infraspinatus tendinopathy. 2. Moderate degenerative glenohumeral arthropathy with small to moderate glenohumeral joint effusion. 3. Mild degenerative AC joint arthropathy. 4. Trace subacromial subdeltoid bursitis.  Electronically Signed   By: Van Clines M.D.   On: 03/12/2022 11:02    Past Medical/Family/Surgical/Social History: Medications & Allergies reviewed per EMR, new medications updated. 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   SOBOE (shortness of breath on exertion) 11/21/2021   Other fatigue 11/21/2021   Vitamin D deficiency 11/21/2021   Iron deficiency 11/21/2021   Hypoglycemia after bariatric surgery 11/21/2021   Depression screening 11/21/2021   Class 3 severe obesity with serious comorbidity and body mass index (BMI) of 40.0 to 44.9 in adult ([place]) 11/21/2021   Mild obstructive sleep apnea 11/17/2021   Vertigo 11/17/2021   Excessive daytime sleepiness 10/23/2021   Spinal stenosis of lumbar region 07/07/2020   Unilateral primary osteoarthritis, right knee 06/08/2020   Displacement of lumbar intervertebral disc 06/02/2020   Lumbar spondylosis 06/02/2020   Lumbar stenosis with neurogenic claudication 06/02/2020   Arm numbness 05/30/2020   Chronic neck pain 05/30/2020   Other chronic pain 05/30/2020   Elevated blood-pressure reading, without diagnosis of hypertension 05/30/2020   Status post arthroscopy of right knee 12/11/2019   Polyethylene wear of left knee joint prosthesis ([place]) 09/09/2019   Acute medial meniscal tear, right, subsequent encounter 08/27/2019   History of total left knee replacement 07/16/2019   Peroneal neuropathy at knee, left 06/25/2019   Lumbosacral radiculopathy 06/07/2019   Iron malabsorption 10/16/2018   IDA (iron deficiency anemia) 10/16/2018   OA (osteoarthritis) of knee 01/20/2018   Acute meniscal tear, medial 07/17/2017   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   Failed total hip arthroplasty, 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/2017   Pain in left knee 04/11/2017   Insomnia 11/08/2016   Pernicious anemia 11/08/2016   Leukopenia 07/09/2016   Hypocupremia 07/09/2016   Palpitations 04/12/2016   Shortness of breath 04/12/2016   Reactive hypoglycemia 10/31/2015   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/31/2015   Lap gastric  bypass May 2016 07/06/2014   History of Clostridium difficile infection 08/12/2011   Post-surgical  hypothyroidism 08/26/2010   Gastroesophageal reflux disease 08/26/2010   Vitamin D deficiency 08/26/2010   Fibrocystic disease of breast 08/26/2010   Cobalamin deficiency 08/26/2010   Morbid obesity ([place]) 03/20/2010   Backache 03/20/2010   Past Medical History:  Diagnosis Date   Back pain    Bursitis    right shoulder   Chronic leukopenia    intermittant since 2010, followed by Dr. Renold Genta now   Chronic neck pain    Constipation    Degenerative joint disease of low back 09/02/2015   Dr. Maureen Ralphs   Displacement of lumbar intervertebral disc    Edema of both lower legs    Fibrocystic breast    Gallbladder problem    GERD (gastroesophageal reflux disease)    Headache    per pt more stress/tension   Heart palpitations    SEE EPIC ENCOUTNER , CARDIOLOGY DR. Tressia Miners TURNER 2018; reports on 05-16-17 " i haven't had any bouts of those lately"     Hemorrhoids    Hidradenitis suppurativa    History of Clostridium difficile infection 12/2010   History of exercise intolerance    ETT on 04-27-2016-- negative ([facility] treadmill score 7)   History of Helicobacter pylori infection 2001 and 09/ 2012   HSV-2 infection    genital   Hx of adenomatous colonic polyps 10/17/2007   Hydradenitis    per pt currently treated with Humira injection   Hyperthyroidism    Hypocupremia    Hypothyroidism    Hypothyroidism, postsurgical 1980   IBS (irritable bowel syndrome)    Insomnia    Iron deficiency    Joint pain    Leukopenia    Lumbosacral radiculopathy    Medial meniscus tear    right knee   Mild obstructive sleep apnea    Mitral regurgitation    Mild to moderate by echo 09/2021   Numbness and tingling of right arm    Obesity    Osteoarthritis    Palpitations    Pernicious anemia    b12 def   Peroneal neuropathy    PONV (postoperative nausea and vomiting)    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   Prediabetes    Reactive hypoglycemia followed by pcp   post gastrectomy dumping syndrome    SOB (shortness of breath)    Spinal stenosis    Tendonitis    right shoulder   Varicose veins    Vertigo    Vitamin B 12 deficiency    Vitamin D deficiency    Family History  Problem Relation Age of Onset   High blood pressure Mother    Hypertension Mother    Headache Mother    High Cholesterol Mother    Thyroid disease Mother    Obesity Mother    Kidney disease Mother    Hypertension Father    Diabetes Father    High blood pressure Father    High Cholesterol Father    Heart disease Father    Obesity Father    Diabetes Sister    Hypertension Sister    Thyroid disease Brother    Thyroid disease Maternal Aunt    Migraines Neg Hx    Past Surgical History:  Procedure Laterality Date   BREATH TEK H PYLORI N/A 05/03/2014   Procedure: BREATH TEK H PYLORI;  Surgeon: Kaylyn Lim, MD;  Location: [facility];  Service: General;  Laterality: N/A;  CARDIOVASCULAR STRESS TEST  03/11/2013   dr Tressia Miners turner   Low risk nuclear study w/ a very small anterior perfusion defect that most likely represents breast attenuation artifact, less likely this could represent a true perfusion abnormality in the distribution of diagonal branch of the LAD/  normal LV function and wall motion , ef 66%   CATARACT EXTRACTION W/ INTRAOCULAR LENS  IMPLANT, BILATERAL  10/2017   [facility]   COLONOSCOPY  02/2017   Dr [name] ; per patient they found 7 polyps with polypectomy; on 5 year track    ESOPHAGOGASTRODUODENOSCOPY  02/01/2021   FINE NEEDLE ASPIRATION Left 05/22/2017   Procedure: LEFT KNEE ASPIRATION;  Surgeon: Gaynelle Arabian, MD;  Location: [facility];  Service: Orthopedics;  Laterality: Left;   GASTRIC ROUX-EN-Y N/A 07/06/2014   Procedure: LAPAROSCOPIC ROUX-EN-Y GASTRIC BYPASS, ENTEROLYSIS OF ADHESIONS WITH UPPER ENDOSCOPY;  Surgeon: Johnathan Hausen, MD;  Location: [facility];  Service: General;  Laterality: N/A;   HAMMER TOE SURGERY Bilateral 02/17/2008   bilateral  foot digit's 2 and 5   HIATAL HERNIA REPAIR N/A 07/06/2014   Procedure: LAPAROSCOPIC REPAIR OF HIATAL HERNIA;  Surgeon: Johnathan Hausen, MD;  Location: [facility];  Service: General;  Laterality: N/A;   I & D KNEE WITH POLY EXCHANGE Left 12/11/2019   Procedure: LEFT KNEE POLY-LINER EXCHANGE;  Surgeon: Mcarthur Rossetti, MD;  Location: [facility];  Service: Orthopedics;  Laterality: Left;   KNEE ARTHROSCOPY Right 12/11/2019   Procedure: RIGHT KNEE ARTHROSCOPY WITH PARTIAL MEDIAL MENISCECTOMY;  Surgeon: Mcarthur Rossetti, MD;  Location: [place];  Service: Orthopedics;  Laterality: Right;   KNEE ARTHROSCOPY WITH MEDIAL MENISECTOMY Left 07/17/2017   Procedure: LEFT KNEE ARTHROSCOPY WITH MEDIAL LATERAL MENISECTOMY;  Surgeon: Gaynelle Arabian, MD;  Location: [facility];  Service: Orthopedics;  Laterality: Left;   MASS EXCISION N/A 10/11/2016   Procedure: EXCISION OF PERIANAL MASS;  Surgeon: [name], MD;  Location: [place];  Service: General;  Laterality: N/A;   [place]  08-25-2002   dr Margaretha Glassing   ovaries retained   TOTAL HIP ARTHROPLASTY Right 05/21/2012   Procedure: TOTAL HIP ARTHROPLASTY;  Surgeon: Gearlean Alf, MD;  Location: [facility];  Service: Orthopedics;  Laterality: Right;   TOTAL HIP ARTHROPLASTY Left 01-31-2009  dr Wynelle Link   TOTAL HIP REVISION Left 05/22/2017   Procedure: Left hip bearing surface and head revision;  Surgeon: Gaynelle Arabian, MD;  Location: [facility];  Service: Orthopedics;  Laterality: Left;   TOTAL KNEE ARTHROPLASTY Left 01/20/2018   Procedure: LEFT TOTAL KNEE ARTHROPLASTY;  Surgeon: Gaynelle Arabian, MD;  Location: [facility];  Service: Orthopedics;  Laterality: Left;  70mn   TRANSTHORACIC ECHOCARDIOGRAM  05/03/2016   ef 55-60%/  trivial MR/  mild TR   TUBAL LIGATION     Social History   Occupational History   Occupation: REconomistGI    Employer: [employer]  Tobacco Use    Smoking status: Never   Smokeless tobacco: Never  Vaping Use   Vaping Use: Never used  Substance and Sexual Activity   Alcohol use: No   Drug use: No   Sexual activity: Not Currently    Partners: Male    Birth control/protection: Surgical    Comment: hysterectomy

## 2022-04-11 ENCOUNTER — Encounter (INDEPENDENT_AMBULATORY_CARE_PROVIDER_SITE_OTHER): Payer: Self-pay | Admitting: Internal Medicine

## 2022-04-11 ENCOUNTER — Ambulatory Visit (INDEPENDENT_AMBULATORY_CARE_PROVIDER_SITE_OTHER): Payer: 59 | Admitting: Internal Medicine

## 2022-04-11 VITALS — BP 130/86 | HR 55 | Temp 97.6°F | Ht 67.0 in | Wt 266.0 lb

## 2022-04-11 DIAGNOSIS — Z9884 Bariatric surgery status: Secondary | ICD-10-CM | POA: Diagnosis not present

## 2022-04-11 DIAGNOSIS — E161 Other hypoglycemia: Secondary | ICD-10-CM | POA: Diagnosis not present

## 2022-04-11 DIAGNOSIS — Z6841 Body Mass Index (BMI) 40.0 and over, adult: Secondary | ICD-10-CM | POA: Diagnosis not present

## 2022-04-11 DIAGNOSIS — E669 Obesity, unspecified: Secondary | ICD-10-CM

## 2022-04-11 NOTE — Assessment & Plan Note (Signed)
Weight regain likely secondary to maladaptive behaviors following surgery.  Continue with nutrition therapy. No need for UGIS at this time do not suspect GGF.

## 2022-04-11 NOTE — Progress Notes (Signed)
Office: [phone]  /  Fax: [phone]  WEIGHT SUMMARY AND BIOMETRICS  Medical Weight Loss Height: 5' 7"$  (1.702 m) Weight: 266 lb (120.7 kg) Temp: 97.6 F (36.4 C) Pulse Rate: (!) 55 BP: 130/86 SpO2: 97 % Fasting: n Labs: n Today's Visit #: 9 Weight at Last VIsit: 273 lb Weight Lost Since Last Visit: 7 lb  Body Fat %: 52.9 % Fat Mass (lbs): 140.8 lbs Muscle Mass (lbs): 119 lbs Total Body Water (lbs): 94.8 lbs Visceral Fat Rating : 17 Peak Weight: 297 lb Starting Date: 11/20/21 Starting Weight: 273 LB Total Weight Loss (lbs): 9 lb (4.082 kg)    HPI  Chief Complaint: OBESITY  Victoria [name] is here to discuss her progress with her obesity treatment plan. She is on the the Category 1 Plan and states she is following her eating plan approximately 0 % of the time. She states she is  not exercising.   Interval History:  Since last office visit she has lost 7 pounds which is significant considerable difficulty has been for her to lose weight. She is not following structured reduced calorie plan but has increased the frequency of meals, has increased protein intake, has been eating healthier snacks and has decreased eating out.  She drinks a [employer] a day and does not like to drink water.  She is getting protein from lean meats as well as fermentable dairy.  She is eating more vegetables but not as much fruits.  She has not experienced postprandial hypoglycemia.  She occasionally uses a protein shake once to twice a day.   Pharmacotherapy: None  PHYSICAL EXAM:  Blood pressure 130/86, pulse (!) 55, temperature 97.6 F (36.4 C), height 5' 7"$  (1.702 m), weight 266 lb (120.7 kg), SpO2 97 %. Body mass index is 41.66 kg/m.  General: She is overweight, cooperative, alert, well developed, and in no acute distress. PSYCH: Has normal mood, affect and thought process.   HEENT: EOMI, sclerae are anicteric. Lungs: Normal breathing effort, no conversational dyspnea. Extremities:  No edema.  Neurologic: No gross sensory or motor deficits. No tremors or fasciculations noted.    DIAGNOSTIC DATA REVIEWED:  BMET    Component Value Date/Time   NA 143 02/20/2022 0809   K 4.3 02/20/2022 0809   CL 105 02/20/2022 0809   CO2 22 02/20/2022 0809   GLUCOSE 84 02/20/2022 0809   GLUCOSE 97 08/01/2021 1417   BUN 15 02/20/2022 0809   CREATININE 0.96 02/20/2022 0809   CREATININE 0.94 06/29/2021 1105   CALCIUM 9.2 02/20/2022 0809   GFRNONAA >60 08/01/2021 1417   GFRNONAA 93 06/30/2020 1105   GFRAA 108 06/30/2020 1105   Lab Results  Component Value Date   HGBA1C 5.8 (H) 02/20/2022   HGBA1C 5.7 (H) 08/20/2013   Lab Results  Component Value Date   INSULIN 5.4 02/20/2022   INSULIN 4 09/16/2015   Lab Results  Component Value Date   TSH 1.040 02/20/2022   CBC    Component Value Date/Time   WBC 5.2 01/12/2022 1245   WBC 4.4 08/01/2021 1417   RBC 3.90 01/12/2022 1247   RBC 3.91 01/12/2022 1245   HGB 12.8 01/12/2022 1245   HGB 10.9 (L) 09/18/2018 0853   HGB 12.6 07/09/2016 1529   HCT 38.4 01/12/2022 1245   HCT 42.7 11/20/2021 1104   HCT 38.2 07/09/2016 1529   PLT 219 01/12/2022 1245   PLT 233 09/18/2018 0853   MCV 98.2 01/12/2022 1245   MCV 94 09/18/2018 0853  MCV 96.9 07/09/2016 1529   MCH 32.7 01/12/2022 1245   MCHC 33.3 01/12/2022 1245   RDW 13.6 01/12/2022 1245   RDW 14.8 09/18/2018 0853   RDW 13.5 07/09/2016 1529   Iron Studies    Component Value Date/Time   IRON 123 01/12/2022 1245   IRON 49 09/18/2018 0853   TIBC 343 01/12/2022 1245   TIBC 415 09/18/2018 0853   FERRITIN 21 01/12/2022 1246   FERRITIN 11 (L) 09/18/2018 0853   IRONPCTSAT 36 (H) 01/12/2022 1245   IRONPCTSAT 40 06/29/2021 1105   Lipid Panel     Component Value Date/Time   CHOL 191 02/20/2022 0809   TRIG 43 02/20/2022 0809   HDL 84 02/20/2022 0809   CHOLHDL 2.8 01/04/2022 1152   VLDL 9 03/12/2016 1038   LDLCALC 98 02/20/2022 0809   LDLCALC 98 01/04/2022 1152   Hepatic  Function Panel     Component Value Date/Time   PROT 6.9 02/20/2022 0809   ALBUMIN 4.0 02/20/2022 0809   AST 21 02/20/2022 0809   AST 26 10/16/2018 0903   ALT 33 (H) 02/20/2022 0809   ALT 26 10/16/2018 0903   ALKPHOS 70 02/20/2022 0809   BILITOT 0.3 02/20/2022 0809   BILITOT 0.5 10/16/2018 0903   BILIDIR 0.1 03/16/2013 0740      Component Value Date/Time   TSH 1.040 02/20/2022 0809   Nutritional Lab Results  Component Value Date   VD25OH 50.1 02/20/2022   VD25OH 29.4 (L) 11/20/2021   VD25OH 18.9 (L) 07/07/2020     ASSESSMENT AND PLAN  TREATMENT PLAN FOR OBESITY:  Recommended Dietary Goals  Victoria [name] is currently in the action stage of change. As such, her goal is to continue weight management plan. She has agreed to change to practicing portion control and making smarter food choices, such as increasing vegetables and decreasing simple carbohydrates.  Behavioral Intervention  We discussed the following Behavioral Modification Strategies today: increasing lean protein intake, increasing vegetables, increasing lower sugar fruits, increasing fiber rich foods, avoid skipping meals, increase water intake, work on meal planning and easy cooking plans, and think about ways to increase physical activity.  Additional resources provided today: NA  Recommended Physical Activity Goals  Victoria [name] has been advised to work up to 150 minutes of moderate intensity aerobic activity a week and strengthening exercises 2-3 times per week for cardiovascular health, weight loss maintenance and preservation of muscle mass.   She has agreed to increase physical activity in their day and reduce sedentary time (increase NEAT).    Pharmacotherapy We discussed various medication options to help Victoria [name] with her weight loss efforts and we both agreed to continue with nutritional and behavioral strategies.  ASSOCIATED CONDITIONS ADDRESSED TODAY  History of Roux-en-Y gastric bypass Assessment &  Plan: Weight regain likely secondary to maladaptive behaviors following surgery.  Continue with nutrition therapy. No need for UGIS at this time do not suspect GGF.   Obesity with current BMI of 42.9  Other hypoglycemia-post bariatric Assessment & Plan: Improved.  She has reduced consumption of simple carbohydrates.  We talked about increasing fruits and complex carbs in her diet.       Return in about 2 weeks (around 04/25/2022) for For Weight Mangement with Dr. Gerarda Fraction.Marland Kitchen She was informed of the importance of frequent follow up visits to maximize her success with intensive lifestyle modifications for her multiple health conditions.   ATTESTASTION STATEMENTS:  Reviewed by clinician on day of visit: allergies, medications, problem list, medical history, surgical history,  family history, social history, and previous encounter notes.   Time spent on visit including pre-visit chart review and post-visit care and charting was 30 minutes.    Thomes Dinning, MD

## 2022-04-11 NOTE — Assessment & Plan Note (Signed)
Improved.  She has reduced consumption of simple carbohydrates.  We talked about increasing fruits and complex carbs in her diet.

## 2022-04-13 ENCOUNTER — Other Ambulatory Visit: Payer: Self-pay | Admitting: Internal Medicine

## 2022-04-13 DIAGNOSIS — Z1231 Encounter for screening mammogram for malignant neoplasm of breast: Secondary | ICD-10-CM

## 2022-04-17 ENCOUNTER — Other Ambulatory Visit: Payer: Self-pay

## 2022-04-19 ENCOUNTER — Other Ambulatory Visit (HOSPITAL_COMMUNITY): Payer: Self-pay

## 2022-04-20 ENCOUNTER — Other Ambulatory Visit (HOSPITAL_COMMUNITY): Payer: Self-pay

## 2022-04-23 ENCOUNTER — Other Ambulatory Visit: Payer: Self-pay

## 2022-04-23 ENCOUNTER — Other Ambulatory Visit (HOSPITAL_COMMUNITY): Payer: Self-pay

## 2022-04-24 ENCOUNTER — Other Ambulatory Visit (HOSPITAL_COMMUNITY): Payer: Self-pay

## 2022-04-24 ENCOUNTER — Encounter: Payer: Self-pay | Admitting: Family

## 2022-04-24 ENCOUNTER — Other Ambulatory Visit: Payer: Self-pay

## 2022-04-24 ENCOUNTER — Encounter: Payer: Self-pay | Admitting: Orthopaedic Surgery

## 2022-04-27 DIAGNOSIS — G4733 Obstructive sleep apnea (adult) (pediatric): Secondary | ICD-10-CM | POA: Diagnosis not present

## 2022-04-30 ENCOUNTER — Other Ambulatory Visit (HOSPITAL_COMMUNITY): Payer: Self-pay

## 2022-04-30 ENCOUNTER — Ambulatory Visit (INDEPENDENT_AMBULATORY_CARE_PROVIDER_SITE_OTHER): Payer: 59 | Admitting: Physician Assistant

## 2022-04-30 ENCOUNTER — Encounter: Payer: Self-pay | Admitting: Orthopaedic Surgery

## 2022-04-30 ENCOUNTER — Encounter: Payer: Self-pay | Admitting: Family

## 2022-04-30 ENCOUNTER — Ambulatory Visit: Payer: 59 | Admitting: Orthopaedic Surgery

## 2022-04-30 VITALS — Ht 67.0 in | Wt 280.0 lb

## 2022-04-30 DIAGNOSIS — M25561 Pain in right knee: Secondary | ICD-10-CM

## 2022-04-30 DIAGNOSIS — G8929 Other chronic pain: Secondary | ICD-10-CM | POA: Diagnosis not present

## 2022-04-30 NOTE — Progress Notes (Signed)
The patient is very well-known to me.  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  This has been seen on clinical exam and x-ray findings.  This is beginning worse over the last 3 years.  She has tried and failed all forms of conservative treatment.  She is currently on Celebrex.  She has worked on weight loss and activity modification.  She has been through therapy with quad strengthening exercises.  She has had steroid injections in that right knee as well as hyaluronic acid in the right knee.  She now wakes up with pain.  Her right knee pain is 10 out of 10 and it is on a daily basis.  It is detrimentally affecting her mobility, her quality of life, and her actives of daily living to the point she wished to proceed with a knee replacement on the right knee.  She has a history of a left total knee arthroplasty by one of my colleagues in town in 2019.  This had to be revised in 2021 to a thicker polyliner.  That knee has now done well for her.  On exam when she stands she does have valgus malalignment of the left knee but on exam it is more of a varus malalignment.  There is significant medial and lateral joint line tenderness and patellofemoral crepitation.  There is pain throughout the arc of motion of the right knee.  He is ligamentously stable.  His x-rays show tricompartment arthritis with bone-on-bone wear especially medially and patellofemoral joint.  There is complete loss of joint space medially when she stands.  There are osteophytes in all 3 compartments.  We had a long and thorough discussion again about knee replacement surgery.  Having had this before she is fully aware of the risks and benefits of the surgery and what to expect from intraoperative and postoperative course.  All question and concerns were answered and addressed.  We will work on getting this scheduled.

## 2022-05-01 ENCOUNTER — Other Ambulatory Visit (HOSPITAL_COMMUNITY): Payer: Self-pay

## 2022-05-01 ENCOUNTER — Other Ambulatory Visit: Payer: Self-pay | Admitting: Internal Medicine

## 2022-05-01 ENCOUNTER — Other Ambulatory Visit: Payer: Self-pay

## 2022-05-01 ENCOUNTER — Telehealth: Payer: Self-pay | Admitting: *Deleted

## 2022-05-01 MED ORDER — TIZANIDINE HCL 4 MG PO CAPS
4.0000 mg | ORAL_CAPSULE | Freq: Three times a day (TID) | ORAL | 1 refills | Status: DC | PRN
  Filled 2022-05-01: qty 90, 30d supply, fill #0
  Filled 2022-05-31: qty 90, 30d supply, fill #1

## 2022-05-01 MED ORDER — LORAZEPAM 2 MG PO TABS
2.0000 mg | ORAL_TABLET | Freq: Every day | ORAL | 2 refills | Status: DC
  Filled 2022-05-01: qty 30, 30d supply, fill #0

## 2022-05-01 NOTE — Telephone Encounter (Signed)
Completed [employer] PA on CMM. Key: [id number]. Awaiting determination from [place].

## 2022-05-02 ENCOUNTER — Other Ambulatory Visit (HOSPITAL_COMMUNITY): Payer: Self-pay

## 2022-05-02 MED ORDER — GOLYTELY 236 G PO SOLR
ORAL | 0 refills | Status: DC
  Filled 2022-05-02: qty 4000, 1d supply, fill #0

## 2022-05-03 ENCOUNTER — Other Ambulatory Visit (HOSPITAL_COMMUNITY): Payer: Self-pay

## 2022-05-03 ENCOUNTER — Encounter: Payer: Self-pay | Admitting: Radiology

## 2022-05-03 ENCOUNTER — Other Ambulatory Visit: Payer: Self-pay

## 2022-05-03 NOTE — Telephone Encounter (Signed)
The request has been approved. The authorization is effective from 05/24/2022 to 10/21/2022, as long as the member is enrolled in their current health plan.. Authorization Expiration Date: October 21, 2022.

## 2022-05-07 NOTE — Progress Notes (Unsigned)
FOLLOW UP Date of Service/Encounter:  05/09/22   Subjective:  Victoria [name] (DOB: [birth year]) is a 62 y.o. 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 History obtained from: chart review and patient.  For Review, LV was on 08/23/21  with Dr.Romell Cavanah seen for intial visit for chronic rhinitis and cough . See below for details. We started singulair, clarinex and mucinex.  Pertinent History/Diagnostics:  Chronic cough: Cough is dry, feels triggered by drainage. Albuterol prescribed during covid for cough and wheeze and it was helpful. Coughing worse when pollen counts high. On gabapentin 600 mg daily for neck and back pain.  - 08/23/21 spirometry : ratio 101%, 79% FEV1 - Echo 08.17/24: EF 58%, mild-to-mod mitral valve regurgitation Allergic Rhinitis:  -losing voice spring. Watery eyes, itchy eyes-eye drops help (systane complete) PND, raspy voice, coughing. Summer and Fall are worst seasons. Xyzal makes her sleepy, zyrtec, claritin, and allegra do not seem to help.  Reflux on lansoprazole 40 mg daily. 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  She reports getting toxic from her hip replacement and this was documented as  a metal allergy and she had a revision.  She denies any other symptoms with this, no overlying rash.  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 Her voice is currently hoarse. Started yesterday, this is cyclical for her. Isymptoms are intermittent. Her main concerns are increased drainage, hoarseness and migraines with  nausea. Also reports rhinorrhea when eating.  months. She doesn't feel like she is having reflux. She is taking xyzal and singulair.  She does wear a CPAP now, but she has been off for a few days which is when hoarseness developed. She has some mucinex, but hasn't taken any. She doesn't think she can do nasal sprays.  Cough also intermittent.  Not as chronic as it was when she first saw [place]. She has tried albuterol and it did help.  She has not had to use it for the past few months.   She does have an upcoming colonoscopy on Monday.   She is starting prep.  She is planning to get a knee replacement and is now interested in metal patch testing.   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 If you have any questions, ask your nurse or doctor.          STOP taking these medications    Qulipta 60 MG Tabs Generic drug: Atogepant Stopped by: Clemon Chambers, MD       TAKE these medications    albuterol 108 (90 Base) MCG/ACT inhaler Commonly known as: VENTOLIN HFA Inhale 2 puffs into the lungs every 4 hours as needed for wheezing or shortness of breath.   CALCIUM + D3 PO Take 1 tablet by mouth daily.   Carbinoxamine Maleate 4  MG Tabs Take 1-2 tablets at night as needed for drainage and allergies. May cause drowsiness. Started by: Clemon Chambers, MD   celecoxib 200 MG capsule Commonly known as: CELEBREX Take 1 capsule (200 mg total) by mouth 2 (two) times daily as needed.   clobetasol ointment 0.05 % Commonly known as: TEMOVATE Apply to skin twice daily as directed for up to 2 weeks as needed   cyanocobalamin 1000 MCG/ML injection Commonly known as: Dodex INJECT 1 ML INTRAMUSCULARLY EVERY MONTH   Emgality 120 MG/ML Soaj Generic  drug: Galcanezumab-gnlm Inject 240 mg into the skin once for 1 dose. First month only 2 injections. Thereafter one injection monthly.   Emgality 120 MG/ML Soaj Generic drug: Galcanezumab-gnlm Inject 120 mg into the skin every 30 (thirty) days.   escitalopram 10 MG tablet Commonly known as: Lexapro Take 1 tablet (10 mg total) by mouth daily.   Estradiol 1.25 MG/1.25GM Gel Commonly known as: Divigel Use one packet daily as directed   flecainide 50 MG tablet Commonly known as: TAMBOCOR Take 1 tablet by mouth 2 times daily.   gabapentin 600 MG tablet Commonly known as: NEURONTIN Take 1 tablet (600 mg total) by mouth 3 (three) times daily.   Humira (2 Pen) 40 MG/0.8ML Pnkt Generic drug: Adalimumab Inject 1 pen subcutaneously every week   ipratropium 0.03 % nasal spray Commonly known as: ATROVENT Take 1-2 sprays up to 3 times daily as needed for runny nose. Can take 15 minutes prior to meals. Started by: Clemon Chambers, MD   levocetirizine 5 MG tablet Commonly known as: XYZAL Take 1 tablet (5 mg total) by mouth every evening.   levothyroxine 112 MCG tablet Commonly known as: SYNTHROID Take 1 tablet (112 mcg total) by mouth daily.   LORazepam 2 MG tablet Commonly known as: ATIVAN Take 1 tablet (2 mg total) by mouth at bedtime.   metoprolol succinate 25 MG 24 hr tablet Commonly known as: TOPROL-XL TAKE 1 TABLET BY MOUTH DAILY.   montelukast 10 MG tablet Commonly known as: Singulair Take 1 tablet by mouth at bedtime.   Olopatadine HCl 0.2 % Soln Apply 1 drop to eye daily as needed.   pantoprazole 40 MG tablet Commonly known as: PROTONIX Take 1 tablet by mouth daily 20 minutes before breakfast   PEG-3350/Electrolytes 236 g Solr Mix and take as directed   prenatal multivitamin Tabs tablet Take 1 tablet by mouth 2 (two) times daily with a meal.   promethazine 25 MG tablet Commonly known as: PHENERGAN Take 1 tablet (25 mg total) by mouth every 8 (eight) hours as  needed for N/V.   sulfamethoxazole-trimethoprim 800-160 MG tablet Commonly known as: Bactrim DS Take 1 tablet by mouth 2 (two) times daily as needed for flare   terconazole 0.4 % vaginal cream Commonly known as: TERAZOL 7 Place 1 applicatorful vaginally at bedtime for seven nights.   Then use one applicatorful once a week for 6 months.   tiZANidine 4 MG capsule Commonly known as: Zanaflex Take 1 capsule (4 mg total) by mouth 3 (three) times daily as needed for muscle spasms.   Ubrelvy 100 MG Tabs Generic drug: Ubrogepant Take 100 mg by mouth every 2 (two) hours as needed. Maximum '200mg'$  a day.   Vitamin D (Ergocalciferol) 1.25 MG (50000 UNIT) Caps capsule Commonly known as: DRISDOL Take 1 capsule (50,000 Units total) by mouth 2 (two) times a week for Vitamin deficiency and gastric bypass.       Past Medical History:  Diagnosis Date   Back pain    Bursitis    right shoulder   Chronic leukopenia    intermittant since 2010, followed by Dr. Renold Genta now   Chronic neck pain    Constipation    Degenerative joint disease of low back 09/02/2015   Dr. Maureen Ralphs   Displacement of lumbar intervertebral disc    Edema of both lower legs    Fibrocystic breast    Gallbladder problem    GERD (gastroesophageal reflux disease)    Headache    per pt more stress/tension   Heart palpitations    SEE EPIC ENCOUTNER , CARDIOLOGY DR. Tressia Miners TURNER 2018; reports on 05-16-17 " i haven't had any bouts of those lately"     Hemorrhoids    Hidradenitis suppurativa    History of Clostridium difficile infection 12/2010   History of exercise intolerance    ETT on 04-27-2016-- negative ([facility] treadmill score 7)   History of Helicobacter pylori infection 2001 and 09/ 2012   HSV-2 infection    genital   Hx of adenomatous colonic polyps 10/17/2007   Hydradenitis    per pt currently treated with Humira injection   Hyperthyroidism    Hypocupremia    Hypothyroidism    Hypothyroidism, postsurgical 1980    IBS (irritable bowel syndrome)    Insomnia    Iron deficiency    Joint pain    Leukopenia    Lumbosacral radiculopathy    Medial meniscus tear    right knee   Mild obstructive sleep apnea    Mitral regurgitation    Mild to moderate by echo 09/2021   Numbness and tingling of right arm    Obesity    Osteoarthritis    Palpitations    Pernicious anemia    b12 def   Peroneal neuropathy    PONV (postoperative nausea and vomiting)    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   Prediabetes    Reactive hypoglycemia followed by pcp   post gastrectomy dumping syndrome   SOB (shortness of breath)    Spinal stenosis    Tendonitis    right shoulder   Varicose veins    Vertigo    Vitamin B 12 deficiency    Vitamin D deficiency    Past Surgical History:  Procedure Laterality Date   BREATH TEK H PYLORI N/A 05/03/2014   Procedure: BREATH TEK H PYLORI;  Surgeon: Kaylyn Lim, MD;  Location: [facility];  Service: General;  Laterality: N/A;   CARDIOVASCULAR STRESS TEST  03/11/2013   dr Tressia Miners turner   Low risk nuclear study w/ a very small anterior perfusion defect that most likely represents breast attenuation artifact, less likely this could represent a true perfusion abnormality in the distribution of diagonal branch of the LAD/  normal LV function and wall motion , ef 66%   CATARACT EXTRACTION W/ INTRAOCULAR LENS  IMPLANT, BILATERAL  10/2017   [place]   COLONOSCOPY  02/2017   Dr [name] ; per patient they found 7 polyps with polypectomy; on 5 year track    ESOPHAGOGASTRODUODENOSCOPY  02/01/2021   FINE NEEDLE ASPIRATION Left 05/22/2017   Procedure: LEFT KNEE ASPIRATION;  Surgeon: Gaynelle Arabian, MD;  Location: [facility];  Service: Orthopedics;  Laterality: Left;   GASTRIC ROUX-EN-Y N/A 07/06/2014   Procedure: LAPAROSCOPIC ROUX-EN-Y GASTRIC BYPASS, ENTEROLYSIS OF ADHESIONS WITH UPPER ENDOSCOPY;  Surgeon: Johnathan Hausen, MD;  Location: [facility];  Service:  General;  Laterality: N/A;   HAMMER TOE SURGERY Bilateral 02/17/2008   bilateral foot digit's 2 and 5   HIATAL HERNIA REPAIR N/A 07/06/2014   Procedure: LAPAROSCOPIC REPAIR OF HIATAL HERNIA;  Surgeon: Johnathan Hausen, MD;  Location: [facility];  Service: General;  Laterality: N/A;   I & D KNEE WITH POLY EXCHANGE Left 12/11/2019   Procedure: LEFT KNEE POLY-LINER EXCHANGE;  Surgeon: Mcarthur Rossetti, MD;  Location: [facility];  Service: Orthopedics;  Laterality: Left;   KNEE ARTHROSCOPY Right 12/11/2019   Procedure: RIGHT KNEE ARTHROSCOPY WITH PARTIAL MEDIAL MENISCECTOMY;  Surgeon: Mcarthur Rossetti, MD;  Location: [facility];  Service: Orthopedics;  Laterality: Right;   KNEE ARTHROSCOPY WITH MEDIAL MENISECTOMY Left 07/17/2017   Procedure: LEFT KNEE ARTHROSCOPY WITH MEDIAL LATERAL MENISECTOMY;  Surgeon: Gaynelle Arabian, MD;  Location: [facility];  Service: Orthopedics;  Laterality: Left;   MASS EXCISION N/A 10/11/2016   Procedure: EXCISION OF PERIANAL MASS;  Surgeon: [name], MD;  Location: [facility];  Service: General;  Laterality: N/A;   [place]  08-25-2002   dr Margaretha Glassing   ovaries retained   TOTAL HIP ARTHROPLASTY Right 05/21/2012   Procedure: TOTAL HIP ARTHROPLASTY;  Surgeon: Gearlean Alf, MD;  Location: [facility];  Service: Orthopedics;  Laterality: Right;   TOTAL HIP ARTHROPLASTY Left 01-31-2009  dr Wynelle Link   TOTAL HIP REVISION Left 05/22/2017   Procedure: Left hip bearing surface and head revision;  Surgeon: Gaynelle Arabian, MD;  Location: [facility];  Service: Orthopedics;  Laterality: Left;   TOTAL KNEE ARTHROPLASTY Left 01/20/2018   Procedure: LEFT TOTAL KNEE ARTHROPLASTY;  Surgeon: Gaynelle Arabian, MD;  Location: [facility];  Service: Orthopedics;  Laterality: Left;  52mn   TRANSTHORACIC ECHOCARDIOGRAM  05/03/2016   ef 55-60%/  trivial MR/  mild TR   TUBAL LIGATION     Otherwise, there have been no changes  to her past medical history, surgical history, family history, or social history.  ROS: All others negative except as noted per HPI.   Objective:  BP 110/82   Pulse 68   Temp 98.3 F (36.8 C)   Ht '5\' 7"'$  (1.702 m)   Wt 275 lb 3.2 oz (124.8 kg)   SpO2 99%   BMI 43.10 kg/m  Body mass index is 43.1 kg/m. Physical Exam: General Appearance:  Alert, cooperative, no distress, appears stated age  Head:  Normocephalic, without obvious abnormality, atraumatic  Eyes:  Conjunctiva clear, EOM's intact  Nose: Nares normal, hypertrophic turbinates and normal mucosa  Throat: Lips, tongue normal; teeth and gums normal, normal posterior oropharynx  Neck: Supple, symmetrical  Lungs:   clear to auscultation bilaterally, Respirations unlabored, no coughing  Heart:  regular rate and rhythm and no murmur, Appears well perfused  Extremities: No edema  Skin: Skin color, texture, turgor normal, no rashes or lesions on visualized portions of skin  Neurologic: No gross deficits   Assessment/Plan   Chronic Rhinitis - suspect allergic: - allergy testing via bloodwork today - Continue Singulair (Montelukast) '10mg'$  nightly.  Discontinue if having nightmares or behavior changes - Continue Xyzal in AM, add carbinoxamine 4 - 8 mg (1-2 tablets) nightly as needed -may cause drowsiness. - Continue mucinex 600 mg - 1200 mg (1 or 2 tablets) up to twice daily as needed for drainage. Drink with at least one glass of water when taking. Stay hydrated.   - wait until after upcoming procedure so  you don't get dehydrated.  Gustatory rhinitis: runny nose with eating - try atrovent nasal spray 1-2 sprays up to three times daily as needed for runny nose. Can take 15 minutes prior to meals.  Chronic Conjunctivitis-suspect allergic:  - Consider Allergy Eye drops: great options include Pataday (Olopatadine) or Zaditor (ketotifen) for eye symptoms daily as needed-both sold over the counter if not covered by insurance.   -Avoid  eye drops that say red eye relief as they may contain medications that dry out your eyes.  Chronic cough:  - suspect secondary to post nasal drainage, reflux and possible mild intermittent asthma - albuterol 2 puffs every 4 to 6 hours as needed -  let us know if needing more than twice per week.  Possible metal allergy:  - patch testing, come Monday the 18th at 11 AM for placement - Wednesday 11:45 AM Dr. Simona Huh for first read - Friday morning at 10 AM - following Monday-let us know  Follow up : patch testing next Monday as above. It was a pleasure seeing you again in clinic today! Thank you for allowing me to participate in your care.  Sigurd Sos, MD  [facility]

## 2022-05-08 ENCOUNTER — Other Ambulatory Visit (HOSPITAL_COMMUNITY): Payer: Self-pay

## 2022-05-09 ENCOUNTER — Encounter: Payer: Self-pay | Admitting: Internal Medicine

## 2022-05-09 ENCOUNTER — Other Ambulatory Visit: Payer: Self-pay

## 2022-05-09 ENCOUNTER — Other Ambulatory Visit (HOSPITAL_COMMUNITY): Payer: Self-pay

## 2022-05-09 ENCOUNTER — Ambulatory Visit: Payer: 59 | Admitting: Internal Medicine

## 2022-05-09 VITALS — BP 110/82 | HR 68 | Temp 98.3°F | Ht 67.0 in | Wt 275.2 lb

## 2022-05-09 DIAGNOSIS — R053 Chronic cough: Secondary | ICD-10-CM

## 2022-05-09 DIAGNOSIS — Z9109 Other allergy status, other than to drugs and biological substances: Secondary | ICD-10-CM | POA: Diagnosis not present

## 2022-05-09 DIAGNOSIS — J310 Chronic rhinitis: Secondary | ICD-10-CM

## 2022-05-09 MED ORDER — IPRATROPIUM BROMIDE 0.03 % NA SOLN
NASAL | 3 refills | Status: DC
  Filled 2022-05-09: qty 30, 28d supply, fill #0
  Filled 2022-10-17: qty 30, 28d supply, fill #1

## 2022-05-09 MED ORDER — CARBINOXAMINE MALEATE 4 MG PO TABS
ORAL_TABLET | ORAL | 5 refills | Status: DC
  Filled 2022-05-09: qty 28, 14d supply, fill #0
  Filled 2022-05-31: qty 28, 14d supply, fill #1
  Filled 2022-07-02: qty 28, 14d supply, fill #2

## 2022-05-09 MED ORDER — LEVOCETIRIZINE DIHYDROCHLORIDE 5 MG PO TABS
5.0000 mg | ORAL_TABLET | Freq: Every evening | ORAL | 5 refills | Status: DC
  Filled 2022-05-09: qty 30, 30d supply, fill #0
  Filled 2022-05-31: qty 30, 30d supply, fill #1
  Filled 2022-07-02: qty 30, 30d supply, fill #2

## 2022-05-09 NOTE — Patient Instructions (Addendum)
Chronic Rhinitis - suspect allergic: - allergy testing via bloodwork today - Continue Singulair (Montelukast) '10mg'$  nightly.  Discontinue if having nightmares or behavior changes - Continue Xyzal in AM, add carbinoxamine 4 - 8 mg (1-2 tablets) nightly as needed -may cause drowsiness. - Continue mucinex 600 mg - 1200 mg (1 or 2 tablets) up to twice daily as needed for drainage. Drink with at least one glass of water when taking. Stay hydrated.   - wait until after upcoming procedure so you don't get dehydrated.  Gustatory rhinitis: runny nose with eating - try atrovent nasal spray 1-2 sprays up to three times daily as needed for runny nose. Can take 15 minutes prior to meals.   Chronic Conjunctivitis-suspect allergic:  - Consider Allergy Eye drops: great options include Pataday (Olopatadine) or Zaditor (ketotifen) for eye symptoms daily as needed-both sold over the counter if not covered by insurance.   -Avoid eye drops that say red eye relief as they may contain medications that dry out your eyes.  Chronic cough:  - suspect secondary to post nasal drainage, reflux and possible mild intermittent asthma - if cough continues, try albuterol 2 puffs and make note if helpful - pay attention to any reflux/heartburn symptoms to make sure this is also controlled - your breathing test today looked reassuring  Possible metal allergy:  - patch testing, come Monday the 18th at 11 AM for placement - Wednesday 11:45 AM Dr. Simona Huh for first read - Friday morning at 10 AM - following Monday-let us know  Follow up : patch testing next Monday as above. It was a pleasure seeing you again in clinic today! Thank you for allowing me to participate in your care.  Sigurd Sos, MD [facility]

## 2022-05-10 ENCOUNTER — Other Ambulatory Visit (HOSPITAL_COMMUNITY): Payer: Self-pay

## 2022-05-10 DIAGNOSIS — H524 Presbyopia: Secondary | ICD-10-CM | POA: Diagnosis not present

## 2022-05-10 DIAGNOSIS — H40013 Open angle with borderline findings, low risk, bilateral: Secondary | ICD-10-CM | POA: Diagnosis not present

## 2022-05-10 DIAGNOSIS — R519 Headache, unspecified: Secondary | ICD-10-CM | POA: Diagnosis not present

## 2022-05-10 DIAGNOSIS — H04123 Dry eye syndrome of bilateral lacrimal glands: Secondary | ICD-10-CM | POA: Diagnosis not present

## 2022-05-10 DIAGNOSIS — H35363 Drusen (degenerative) of macula, bilateral: Secondary | ICD-10-CM | POA: Diagnosis not present

## 2022-05-11 ENCOUNTER — Encounter: Payer: Self-pay | Admitting: Internal Medicine

## 2022-05-11 ENCOUNTER — Encounter: Payer: Self-pay | Admitting: Orthopaedic Surgery

## 2022-05-11 ENCOUNTER — Ambulatory Visit: Payer: 59 | Admitting: Family Medicine

## 2022-05-11 ENCOUNTER — Telehealth: Payer: Self-pay | Admitting: Internal Medicine

## 2022-05-11 NOTE — Progress Notes (Unsigned)
Patient came in on the wrong day

## 2022-05-11 NOTE — Progress Notes (Deleted)
Patient Care Team: Elby Showers, MD as PCP - General Sueanne Margarita, MD as PCP - Cardiology (Cardiology) Kennith Center, RD as Dietitian (Family Medicine)  Visit Date: 05/11/22  Subjective:    Patient ID: Victoria [name] , Female   DOB: 1960-06-14, 62 y.o.    MRN: [id number]   62 y.o. Female presents today for possible memory problems. Patient has a past medical history of back pain, neck pain, degenerative joint disease of lower back, displacement of lumbar intervertebral disc, edema, fibrocystic breast disease, GERD, headaches, heart palpitations, hidradenitis suppurativa, adenomatous colonic polyps, hypothyroidism, IBS, insomnia, iron deficiency, leukopenia, peroneal neuropathy, prediabetes, reactive hypoglycemia, spinal stenosis, varicose veins, vertigo, Vitamin B12 deficiency, Vitamin D deficiency.  Reports forgetting things on her schedule, paying bills, where she left things sometimes.  Seen by neurologist, Dr. Jaynee Eagles, on 03/28/22 for migraines and dizziness. Does not tolerate Qulipta. At the time, patient stated Roselyn Meier was having diminished effects and she had to take 2 for relief of migraine headaches. Given Zavzpret nasal spray to try.  Seen by Allergist on 05/09/22 for chronic rhinitis, conjunctivitis, cough.  Is going to need additional orthopedic surgery per Dr. Ninfa Linden. She is giving this careful consideration as to the timing of the surgery and how long she will need to be out of work. Would like to cut back on work schedule. Has considered early retirement but may need to work some part time in order to afford insurance.  Is having issues with diarrhea and has seen Dr. [name]. Dr. [name] wants ptient to have a celiac panel which she will do soon.    Past Medical History:  Diagnosis Date   Back pain    Bursitis    right shoulder   Chronic leukopenia    intermittant since 2010, followed by Dr. Renold Genta now   Chronic neck pain    Constipation    Degenerative  joint disease of low back 09/02/2015   Dr. Maureen Ralphs   Displacement of lumbar intervertebral disc    Edema of both lower legs    Fibrocystic breast    Gallbladder problem    GERD (gastroesophageal reflux disease)    Headache    per pt more stress/tension   Heart palpitations    SEE EPIC ENCOUTNER , CARDIOLOGY DR. Tressia Miners TURNER 2018; reports on 05-16-17 " i haven't had any bouts of those lately"     Hemorrhoids    Hidradenitis suppurativa    History of Clostridium difficile infection 12/2010   History of exercise intolerance    ETT on 04-27-2016-- negative ([facility] treadmill score 7)   History of Helicobacter pylori infection 2001 and 09/ 2012   HSV-2 infection    genital   Hx of adenomatous colonic polyps 10/17/2007   Hydradenitis    per pt currently treated with Humira injection   Hyperthyroidism    Hypocupremia    Hypothyroidism    Hypothyroidism, postsurgical 1980   IBS (irritable bowel syndrome)    Insomnia    Iron deficiency    Joint pain    Leukopenia    Lumbosacral radiculopathy    Medial meniscus tear    right knee   Mild obstructive sleep apnea    Mitral regurgitation    Mild to moderate by echo 09/2021   Numbness and tingling of right arm    Obesity    Osteoarthritis    Palpitations    Pernicious anemia    b12 def   Peroneal neuropathy  PONV (postoperative nausea and vomiting)    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   Prediabetes    Reactive hypoglycemia followed by pcp   post gastrectomy dumping syndrome   SOB (shortness of breath)    Spinal stenosis    Tendonitis    right shoulder   Varicose veins    Vertigo    Vitamin B 12 deficiency    Vitamin D deficiency      Family History  Problem Relation Age of Onset   High blood pressure Mother    Hypertension Mother    Headache Mother    High Cholesterol Mother    Thyroid disease Mother    Obesity Mother    Kidney disease Mother    Hypertension Father    Diabetes Father    High blood pressure  Father    High Cholesterol Father    Heart disease Father    Obesity Father    Diabetes Sister    Hypertension Sister    Thyroid disease Brother    Thyroid disease Maternal Aunt    Migraines Neg Hx     Social History   Social History Narrative   Lives at home with spouse   Right handed   Caffeine: diet zero mtn dew, occasionally       ROS see above- chronic knee pain      Objective:   Vitals: BP 114/80, pulse 62 ,T 97.8 degrees ,pulse ox 98% ,BMI 43.04   Physical Exam She is alert and oriented x 3.  Cranial nerves II through XII are grossly intact.  Affect is normal.  She can remember 3 items after 5 minutes.  She can do serial sevens without issue.   Results:   Studies obtained and personally reviewed by me:  Imaging, colonoscopy, mammogram, bone density scan, echocardiogram, heart cath, stress test, CT calcium score, etc. I, Elby Showers, MD, have reviewed all documentation for this visit. The documentation on 05/18/22 for the exam, diagnosis, procedures, and orders are all accurate and complete.         Assessment & Plan:   Situational stress  Planning for upcoming orthopedic surgery right TKA  Anxiety  Plan: Patient will be given lorazepam 1 mg to take up to 3 times daily but preferably just twice daily if needed for anxiety.  I do not think she needs neurological evaluation of that at this time for memory issues.  I think a lot of it is distraction and worry about her current health situation and work status.  Her TSH is within normal limits on thyroid replacement medication.  She has been having some abdominal issues with diarrhea and Dr. [name] would like for her to have a celiac panel which she will do in the near future.  Regarding musculoskeletal pain, knee is bothering her and I have prescribed 5 days of hydrocodone APAP 10/325 to take sparingly.  She will take preferably 1/2 tablet every 8-12 hours if needed for knee pain.  Planning on right knee total  arthroplasty in the near future.  I,Alexander Ruley,acting as a Education administrator for Elby Showers, MD.,have documented all relevant documentation on the behalf of Elby Showers, MD,as directed by  Elby Showers, MD while in the presence of Elby Showers, MD.   I, Elby Showers, MD, have reviewed all documentation for this visit. The documentation on 05/18/22 for the exam, diagnosis, procedures, and orders are all accurate and complete.

## 2022-05-11 NOTE — Telephone Encounter (Signed)
scheduled

## 2022-05-11 NOTE — Telephone Encounter (Signed)
LVM to CB and schedule an appointment 

## 2022-05-11 NOTE — Telephone Encounter (Signed)
Victoria [name] [phone]  [name] called to say she is having some memory issues like forgetting things, doctors appointments, her phone, paying bills. This is beginning to happen more often and she is worried. Her husband noticed before she did. She called to see if she should see you first or if she needed a referral. I let her know she probably needed to see you first.

## 2022-05-13 LAB — ALLERGENS, ZONE 2

## 2022-05-14 ENCOUNTER — Ambulatory Visit: Payer: 59 | Admitting: Family

## 2022-05-14 ENCOUNTER — Encounter: Payer: 59 | Admitting: Family

## 2022-05-14 ENCOUNTER — Other Ambulatory Visit: Payer: Self-pay

## 2022-05-14 ENCOUNTER — Encounter: Payer: Self-pay | Admitting: Family

## 2022-05-14 ENCOUNTER — Encounter: Payer: Self-pay | Admitting: Internal Medicine

## 2022-05-14 VITALS — BP 124/82 | HR 54 | Temp 98.0°F | Ht 67.0 in | Wt 275.0 lb

## 2022-05-14 DIAGNOSIS — L230 Allergic contact dermatitis due to metals: Secondary | ICD-10-CM

## 2022-05-14 DIAGNOSIS — E039 Hypothyroidism, unspecified: Secondary | ICD-10-CM | POA: Diagnosis not present

## 2022-05-14 DIAGNOSIS — Z9109 Other allergy status, other than to drugs and biological substances: Secondary | ICD-10-CM

## 2022-05-14 DIAGNOSIS — K52832 Lymphocytic colitis: Secondary | ICD-10-CM

## 2022-05-14 DIAGNOSIS — Z1211 Encounter for screening for malignant neoplasm of colon: Secondary | ICD-10-CM | POA: Diagnosis not present

## 2022-05-14 DIAGNOSIS — K562 Volvulus: Secondary | ICD-10-CM | POA: Diagnosis not present

## 2022-05-14 DIAGNOSIS — K635 Polyp of colon: Secondary | ICD-10-CM | POA: Diagnosis not present

## 2022-05-14 DIAGNOSIS — D122 Benign neoplasm of ascending colon: Secondary | ICD-10-CM | POA: Diagnosis not present

## 2022-05-14 HISTORY — DX: Lymphocytic colitis: K52.832

## 2022-05-14 LAB — HM COLONOSCOPY

## 2022-05-14 NOTE — Progress Notes (Signed)
Please let Victoria [name] know that her allergy testing in the blood work is negative. When she is able, we can set her up for skin testing but she will need to be off antihistamines for 3 days prior to that visit.

## 2022-05-14 NOTE — Progress Notes (Signed)
Follow-up Note  RE: Vernon Grenda MRN: [id number] DOB: 1960/12/07 Date of Office Visit: 05/14/2022  Primary care provider: Elby Showers, MD Referring provider: Elby Showers, MD   [name] returns to the office today for the patch test placement, given suspected history of contact dermatitis.    Diagnostics: metal patches placed.  Metals Patch     Time Antigen Placed      Location back    Number of Test     Reading Interval  Day 0    Chromium chloride 1%  0     Cobalt chloride hexahydrate 1%  0     Molybdenum chloride 0.5%  0     Nickel sulfate hexahydrate 5%  0     Potassium dichromate 0.25%  0     Copper sulfate pentahydrate 2%  0     Titanium 0.1%  0     Manganese chloride 0.5%  0     Tantal 1%  0    Vanadium pentoxide 10%  0   Aluminum hydroxide 10%  0    Nickel sulfate hexahydrate 5%  0     Comments  husband was present for instructions due to recent colonoscopy    Plan:   Allergic contact dermatitis Discussed with patient that patch testing tests for contact dermatitis and sometimes it does not correlate to how one will react to metals in the body. Positive patch testing results can help in avoiding those items however it is possible to get false negative results.  Nevertheless, this is the most accessible test for metal sensitivity currently available.  Metal Patches placed today. Please avoid strenuous physical activities and do not get the patches on the back wet. No showering until final patch reading done. Okay to take antihistamines for itching but avoid placing any creams on the back where the patches are. We will remove the patches on Wednesday and will do our initial read. Then you will come back on Friday and next Monday for a final read  Althea Charon, [facility] [place]

## 2022-05-15 NOTE — Progress Notes (Unsigned)
   Follow Up Note  RE: Victoria [name] MRN: [id number] DOB: 1960-05-04 Date of Office Visit: 05/16/2022  Referring provider: Elby Showers, MD Primary care provider: Elby Showers, MD  History of Present Illness: I had the pleasure of seeing Victoria [name] for a follow up visit at the [facility] on 05/16/2022. She is a 62 y.o. female, who is being followed for gustatory rhinitis, metal contact allergy. Today she is here for initial patch test interpretation, given suspected history of contact dermatitis.   Diagnostics:   METAL PATCH TEST 48 hour reading:   Metals Patch - 05/16/22 1200     Location Back    Number of Test 11    Reading Interval Day 3    Aluminum Hydroxide 10% 0    Chromium chloride 1% 0    Cobalt chloride hexahydrate 1% 0    Molybdenum chloride 0.5% 0    Nickel sulfate hexahydrate 5% 0    Potassium dichromate 0.25% 0    Copper sulfate pentahydrate 2% 0    Tantal 1% 0    Titanium 0.1% 0    Manganese chloride 0.5% 0    Vanadium Pentoxide 10% 0              Assessment and Plan: Victoria [name] is a 63 y.o. female with: Concern for Contact Dermatitis:  Reviewed skin care while undergoing patch reads Patient to return in 48 hours  Return in about 2 days (around 05/18/2022).  It was my pleasure to see Victoria [name] today and participate in her care. Please feel free to contact me with any questions or concerns.  Sincerely,   Sigurd Sos, MD [facility]

## 2022-05-16 ENCOUNTER — Encounter: Payer: Self-pay | Admitting: Internal Medicine

## 2022-05-16 ENCOUNTER — Ambulatory Visit: Payer: 59 | Admitting: Internal Medicine

## 2022-05-16 DIAGNOSIS — L230 Allergic contact dermatitis due to metals: Secondary | ICD-10-CM

## 2022-05-18 ENCOUNTER — Other Ambulatory Visit: Payer: Self-pay

## 2022-05-18 ENCOUNTER — Ambulatory Visit (INDEPENDENT_AMBULATORY_CARE_PROVIDER_SITE_OTHER): Payer: 59 | Admitting: Internal Medicine

## 2022-05-18 ENCOUNTER — Other Ambulatory Visit (HOSPITAL_COMMUNITY): Payer: Self-pay

## 2022-05-18 ENCOUNTER — Ambulatory Visit: Payer: 59 | Admitting: Internal Medicine

## 2022-05-18 ENCOUNTER — Encounter: Payer: Self-pay | Admitting: Internal Medicine

## 2022-05-18 ENCOUNTER — Encounter: Payer: Self-pay | Admitting: Family

## 2022-05-18 VITALS — BP 114/80 | HR 62 | Temp 97.8°F | Ht 67.0 in | Wt 274.8 lb

## 2022-05-18 DIAGNOSIS — L230 Allergic contact dermatitis due to metals: Secondary | ICD-10-CM

## 2022-05-18 DIAGNOSIS — R197 Diarrhea, unspecified: Secondary | ICD-10-CM

## 2022-05-18 DIAGNOSIS — Z8669 Personal history of other diseases of the nervous system and sense organs: Secondary | ICD-10-CM

## 2022-05-18 DIAGNOSIS — F439 Reaction to severe stress, unspecified: Secondary | ICD-10-CM | POA: Diagnosis not present

## 2022-05-18 DIAGNOSIS — M1711 Unilateral primary osteoarthritis, right knee: Secondary | ICD-10-CM

## 2022-05-18 DIAGNOSIS — G8929 Other chronic pain: Secondary | ICD-10-CM | POA: Diagnosis not present

## 2022-05-18 DIAGNOSIS — M25561 Pain in right knee: Secondary | ICD-10-CM | POA: Diagnosis not present

## 2022-05-18 DIAGNOSIS — F411 Generalized anxiety disorder: Secondary | ICD-10-CM | POA: Diagnosis not present

## 2022-05-18 MED ORDER — LORAZEPAM 1 MG PO TABS
1.0000 mg | ORAL_TABLET | Freq: Three times a day (TID) | ORAL | 1 refills | Status: DC
  Filled 2022-05-18: qty 90, 30d supply, fill #0

## 2022-05-18 MED ORDER — HYDROCODONE-ACETAMINOPHEN 10-325 MG PO TABS
1.0000 | ORAL_TABLET | Freq: Three times a day (TID) | ORAL | 0 refills | Status: AC | PRN
  Filled 2022-05-18: qty 15, 5d supply, fill #0

## 2022-05-18 NOTE — Progress Notes (Signed)
   Follow Up Note  RE: Victoria [name] MRN: [id number] DOB: March 11, 1960 Date of Office Visit: 05/18/2022  Referring provider: Elby Showers, MD Primary care provider: Elby Showers, MD  History of Present Illness: I had the pleasure of seeing Victoria [name] for a follow up visit at the [facility] on 05/18/2022. She is a 62 y.o. female, who is being followed for contact dermatitis. Today she is here for  second patch test read , given suspected history of contact dermatitis.   Diagnostics:  TRUE TEST 96 hour reading:  Negative to all metals  Metals Patch     Time Antigen Placed      Location     Number of Test     Reading Interval  Day 5     Chromium chloride 1%  0     Cobalt chloride hexahydrate 1%  0     Molybdenum chloride 0.5%  0     Nickel sulfate hexahydrate 5%  0     Potassium dichromate 0.25%  0     Copper sulfate pentahydrate 2%  0     Titanium 0.1%  0     Manganese chloride 0.5%  0     Tantal 1%  0    Vanadium pentoxide 10%  0   Aluminum hydroxide 10%  0    Nickel sulfate hexahydrate 5%  0     Comments  none      Assessment and Plan: Victoria [name] is a 62 y.o. female with: Concern for Contact Dermatitis:  The patient has been provided detailed information regarding the substances she is sensitive to, as well as products containing the substances.  Meticulous avoidance of these substances is recommended. If avoidance is not possible, the use of barrier creams or lotions is recommended. If symptoms persist or progress despite meticulous avoidance of chemicals/substances above, dermatology evaluation may be warranted. Discussed with patient that patch testing tests for contact dermatitis- sometimes it does not correlate to how one will react to metals in the body. Positive patch testing results can help in avoiding those items; however, it is possible to get false negative results.  Nevertheless, this is the most accessible test for metal  sensitivity currently available.  Testing today was negative but we will do a final read on Monday as some metals can have very delayed reactions.   No follow-ups on file.  It was my pleasure to see Victoria [name] today and participate in her care. Please feel free to contact me with any questions or concerns.  Sincerely,   Harlon Flor, MD [facility]

## 2022-05-18 NOTE — Progress Notes (Signed)
Patient Care Team: Elby Showers, MD as PCP - General Sueanne Margarita, MD as PCP - Cardiology (Cardiology) Kennith Center, RD as Dietitian (Family Medicine)  Visit Date: 05/18/22  Subjective:    Patient ID: Victoria [name] , Female   DOB: Dec 10, 1960, 62 y.o.    MRN: [id number]   62 y.o. Female presents today for possible memory problems. Patient has a past medical history of back pain, neck pain, degenerative joint disease of lower back, displacement of lumbar intervertebral disc, edema, fibrocystic breast disease, GERD, headaches, heart palpitations, hidradenitis suppurativa, adenomatous colonic polyps, hypothyroidism, IBS, insomnia, iron deficiency, leukopenia, peroneal neuropathy, prediabetes, reactive hypoglycemia, spinal stenosis, varicose veins, vertigo, Vitamin B12 deficiency, Vitamin D deficiency.   Reports forgetting things on her schedule, paying bills, where she left things sometimes.   Seen by neurologist, Dr. Jaynee Eagles, on 03/28/22 for migraines and dizziness. Does not tolerate Qulipta. At the time, patient stated Roselyn Meier was having diminished effects and she had to take 2 for relief of migraine headaches. Given Zavzpret nasal spray to try.   Seen by Allergist on 05/09/22 for chronic rhinitis, conjunctivitis, cough.   Is going to need additional orthopedic surgery per Dr. Ninfa Linden. She is giving this careful consideration as to the timing of the surgery and how long she will need to be out of work. Would like to cut back on work schedule. Has considered early retirement but may need to work some part time in order to afford insurance.   Is having issues with diarrhea and has seen Dr. [name]. Dr. [name] wants ptient to have a celiac panel which she will do soon.   Past Medical History:  Diagnosis Date   Back pain    Bursitis    right shoulder   Chronic leukopenia    intermittant since 2010, followed by Dr. Renold Genta now   Chronic neck pain    Constipation     Degenerative joint disease of low back 09/02/2015   Dr. Maureen Ralphs   Displacement of lumbar intervertebral disc    Edema of both lower legs    Fibrocystic breast    Gallbladder problem    GERD (gastroesophageal reflux disease)    Headache    per pt more stress/tension   Heart palpitations    SEE EPIC ENCOUTNER , CARDIOLOGY DR. Tressia Miners TURNER 2018; reports on 05-16-17 " i haven't had any bouts of those lately"     Hemorrhoids    Hidradenitis suppurativa    History of Clostridium difficile infection 12/2010   History of exercise intolerance    ETT on 04-27-2016-- negative ([facility] treadmill score 7)   History of Helicobacter pylori infection 2001 and 09/ 2012   HSV-2 infection    genital   Hx of adenomatous colonic polyps 10/17/2007   Hydradenitis    per pt currently treated with Humira injection   Hyperthyroidism    Hypocupremia    Hypothyroidism    Hypothyroidism, postsurgical 1980   IBS (irritable bowel syndrome)    Insomnia    Iron deficiency    Joint pain    Leukopenia    Lumbosacral radiculopathy    Medial meniscus tear    right knee   Mild obstructive sleep apnea    Mitral regurgitation    Mild to moderate by echo 09/2021   Numbness and tingling of right arm    Obesity    Osteoarthritis    Palpitations    Pernicious anemia    b12 def  Peroneal neuropathy    PONV (postoperative nausea and vomiting)    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   Prediabetes    Reactive hypoglycemia followed by pcp   post gastrectomy dumping syndrome   SOB (shortness of breath)    Spinal stenosis    Tendonitis    right shoulder   Varicose veins    Vertigo    Vitamin B 12 deficiency    Vitamin D deficiency      Family History  Problem Relation Age of Onset   High blood pressure Mother    Hypertension Mother    Headache Mother    High Cholesterol Mother    Thyroid disease Mother    Obesity Mother    Kidney disease Mother    Hypertension Father    Diabetes Father    High  blood pressure Father    High Cholesterol Father    Heart disease Father    Obesity Father    Diabetes Sister    Hypertension Sister    Thyroid disease Brother    Thyroid disease Maternal Aunt    Migraines Neg Hx     Social History   Social History Narrative   Lives at home with spouse   Right handed   Caffeine: diet zero mtn dew, occasionally      ROS see above- chronic knee pain      Objective:   Vitals: BP 114/80   Pulse 62   Temp 97.8 F (36.6 C) (Tympanic)   Ht 5\' 7"  (1.702 m)   Wt 274 lb 12.8 oz (124.6 kg)   SpO2 98%   BMI 43.04 kg/m    Physical Exam She is alert and oriented x 3.  Cranial nerves II through XII are grossly intact.  Affect is normal.  She can remember 3 items after 5 minutes.  She can do serial sevens without issue.    Results:   Studies obtained and personally reviewed by me:   Labs:       Component Value Date/Time   NA 143 02/20/2022 0809   K 4.3 02/20/2022 0809   CL 105 02/20/2022 0809   CO2 22 02/20/2022 0809   GLUCOSE 84 02/20/2022 0809   GLUCOSE 97 08/01/2021 1417   BUN 15 02/20/2022 0809   CREATININE 0.96 02/20/2022 0809   CREATININE 0.94 06/29/2021 1105   CALCIUM 9.2 02/20/2022 0809   PROT 6.9 02/20/2022 0809   ALBUMIN 4.0 02/20/2022 0809   AST 21 02/20/2022 0809   AST 26 10/16/2018 0903   ALT 33 (H) 02/20/2022 0809   ALT 26 10/16/2018 0903   ALKPHOS 70 02/20/2022 0809   BILITOT 0.3 02/20/2022 0809   BILITOT 0.5 10/16/2018 0903   GFRNONAA >60 08/01/2021 1417   GFRNONAA 93 06/30/2020 1105   GFRAA 108 06/30/2020 1105     Lab Results  Component Value Date   WBC 5.2 01/12/2022   HGB 12.8 01/12/2022   HCT 38.4 01/12/2022   MCV 98.2 01/12/2022   PLT 219 01/12/2022    Lab Results  Component Value Date   CHOL 191 02/20/2022   HDL 84 02/20/2022   LDLCALC 98 02/20/2022   TRIG 43 02/20/2022   CHOLHDL 2.8 01/04/2022    Lab Results  Component Value Date   HGBA1C 5.8 (H) 02/20/2022     Lab Results   Component Value Date   TSH 1.040 02/20/2022      Assessment & Plan:   Situational stress- work issues, health issues, and upcoming  knee surgery   Planning for upcoming orthopedic surgery- right TKA   Anxiety state- considering when to retire and what is best for her   Patient has concerns about her memory but passes several quick memory tests today with flying colors-  I think maybe she is distracted with not feeling well and situational stress.  GI- issues to have celiac panel at Dr. Lorie Apley office suggested by Dr. [name] due to episodes of diarrhea   Plan: Patient will be given lorazepam 1 mg to take up to 3 times daily but preferably just twice daily if needed for anxiety.  I do not think she needs neurological evaluation of that at this time for memory issues.  I think a lot of it is distraction and worry about her current health situation and work status.  Her TSH is within normal limits on thyroid replacement medication.  She has been having some abdominal issues with diarrhea and Dr. [name] would like for her to have a celiac panel which she will do in the near future.   Regarding musculoskeletal pain, knee is bothering her and I have prescribed 5 days of hydrocodone APAP 10/325 to take sparingly.  She will take preferably 1/2 tablet every 8-12 hours if needed for knee pain.  Planning on right knee total arthroplasty in the near future.   I,Alexander Ruley,acting as a Education administrator for Elby Showers, MD.,have documented all relevant documentation on the behalf of Elby Showers, MD,as directed by  Elby Showers, MD while in the presence of Elby Showers, MD.     I, Elby Showers, MD, have reviewed all documentation for this visit. The documentation on 05/18/22 for the exam, diagnosis, procedures, and orders are all accurate and complete.  IElby Showers, MD, have reviewed all documentation for this visit. The documentation on 05/18/22 for the exam, diagnosis, procedures, and orders are all  accurate and complete.

## 2022-05-18 NOTE — Patient Instructions (Signed)
Concern for contact dermatitis Discussed with patient that patch testing tests for contact dermatitis- sometimes it does not correlate to how one will react to metals in the body. Positive patch testing results can help in avoiding those items; however, it is possible to get false negative results.  Nevertheless, this is the most accessible test for metal sensitivity currently available.

## 2022-05-18 NOTE — Patient Instructions (Addendum)
Patient will get celiac panel at Dr. Lorie Apley office. May take Ativan 1 mg up to 3 times daily sparingly as needed for anxiety and insomnia. Norco 10/325 (#15 tabs) to take 1/2 to one tab every 8 hours as needed for knee pain.

## 2022-05-21 ENCOUNTER — Encounter: Payer: 59 | Admitting: Family

## 2022-05-21 ENCOUNTER — Ambulatory Visit: Payer: 59 | Admitting: Family

## 2022-05-21 ENCOUNTER — Encounter: Payer: Self-pay | Admitting: Family

## 2022-05-21 ENCOUNTER — Other Ambulatory Visit: Payer: Self-pay

## 2022-05-21 DIAGNOSIS — Z9109 Other allergy status, other than to drugs and biological substances: Secondary | ICD-10-CM

## 2022-05-21 DIAGNOSIS — L230 Allergic contact dermatitis due to metals: Secondary | ICD-10-CM

## 2022-05-21 NOTE — Progress Notes (Signed)
Victoria [name] returns to the office today for the final patch test interpretation, given suspected history of contact dermatitis.    Diagnostics:   1 week metal patch test reading: all negative  Metals Patch     Time Antigen Placed      Location      Number of Test      Reading Interval  Day 7    Chromium chloride 1%  0     Cobalt chloride hexahydrate 1%  0     Molybdenum chloride 0.5%  0     Nickel sulfate hexahydrate 5%  0     Potassium dichromate 0.25%  0     Copper sulfate pentahydrate 2%  0     Titanium 0.1%  0     Manganese chloride 0.5%  0     Tantal 1%  0     Vanadium pentoxide 10%  0    Aluminum hydroxide 10%  0    Nickel sulfate hexahydrate 5%  0     Comments  none    Plan:   Allergic contact dermatitis - 1 week reading for metal patch test: negative  Althea Charon, FNP [facility]

## 2022-05-22 ENCOUNTER — Encounter: Payer: Self-pay | Admitting: Orthopaedic Surgery

## 2022-05-24 ENCOUNTER — Ambulatory Visit (INDEPENDENT_AMBULATORY_CARE_PROVIDER_SITE_OTHER): Payer: 59 | Admitting: Physician Assistant

## 2022-05-24 ENCOUNTER — Encounter (INDEPENDENT_AMBULATORY_CARE_PROVIDER_SITE_OTHER): Payer: Self-pay | Admitting: Physician Assistant

## 2022-05-24 VITALS — BP 109/73 | HR 64 | Temp 97.8°F | Ht 67.0 in | Wt 273.0 lb

## 2022-05-24 DIAGNOSIS — R1033 Periumbilical pain: Secondary | ICD-10-CM | POA: Diagnosis not present

## 2022-05-24 DIAGNOSIS — E559 Vitamin D deficiency, unspecified: Secondary | ICD-10-CM | POA: Diagnosis not present

## 2022-05-24 DIAGNOSIS — R1084 Generalized abdominal pain: Secondary | ICD-10-CM | POA: Insufficient documentation

## 2022-05-24 DIAGNOSIS — Z6841 Body Mass Index (BMI) 40.0 and over, adult: Secondary | ICD-10-CM

## 2022-05-24 DIAGNOSIS — K912 Postsurgical malabsorption, not elsewhere classified: Secondary | ICD-10-CM | POA: Diagnosis not present

## 2022-05-24 DIAGNOSIS — R194 Change in bowel habit: Secondary | ICD-10-CM | POA: Diagnosis not present

## 2022-05-24 NOTE — Progress Notes (Signed)
Office: [phone]  /  Fax: [phone]  WEIGHT SUMMARY AND BIOMETRICS  Vitals Temp: 97.8 F (36.6 C) BP: 109/73 Pulse Rate: 64 SpO2: 100 %   Anthropometric Measurements Height: 5\' 7"  (1.702 m) Weight: 273 lb (123.8 kg) BMI (Calculated): 42.75 Weight at Last Visit: 266 lb Weight Gained Since Last Visit: 7 lb Starting Weight: 273 lb Total Weight Loss (lbs): 2 lb (0.907 kg) Peak Weight: 297 lb   Body Composition  Body Fat %: 54.7 % Fat Mass (lbs): 149.8 lbs Muscle Mass (lbs): 117.8 lbs Visceral Fat Rating : 18   Other Clinical Data Fasting: Yes Labs: No Today's Visit #: 10 Starting Date: 11/20/21     HPI  Chief Complaint: OBESITY  Victoria [name] is here to discuss her progress with her obesity treatment plan. She is on the practicing portion control and making smarter food choices, such as increasing vegetables and decreasing simple carbohydrates and states she is following her eating plan approximately 50 % of the time. She states she is exercising 0 minutes 0 times per week.   Interval History:  Since last office visit she is up 7 pounds. She has been having difficulty with persistent diarrhea/abdominal pain and nausea over the past couple of months.  She was evaluated by Dr. 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 History is further complicated by history of hidradenitis suppurative and she takes Humira for this but has recently been on Bactrim DS for a flare over the past several weeks.  She does have a history of C. difficile in the past.  She is going in for celiac panel today and I asked her to also ask to be checked for C. difficile. She is trying to get ready for surgery in May for total knee arthroplasty and is concerned about how she is doing at this point. She has a history of a Roux-en-Y gastric bypass in 2016 ( Dr. Hassell Done at [place])  and supplements  with protein shakes and yogurt.  She has had difficulty tolerating larger volumes of food at a time.  She also reports some issues with recent ankle swelling.  She has had a 2D echo on 10/12/2021 which showed a left ventricular ejection fraction of 58%.   Pharmacotherapy: None for weight loss  PHYSICAL EXAM:  Blood pressure 109/73, pulse 64, temperature 97.8 F (36.6 C), height 5\' 7"  (1.702 m), weight 273 lb (123.8 kg), SpO2 100 %. Body mass index is 42.76 kg/m.  General: She is overweight, cooperative, alert, well developed, and in no acute distress. PSYCH: Has normal mood, affect and thought process.   ABD: soft, reports diffuse discomfort, no focal tenderness Lungs: Normal breathing effort, no conversational dyspnea. Ambulates with antalgic appearing gait. + bilateral ankle edema.   DIAGNOSTIC DATA REVIEWED:  BMET    Component Value Date/Time   NA 143 02/20/2022 0809   K 4.3 02/20/2022 0809   CL 105 02/20/2022 0809   CO2 22 02/20/2022 0809   GLUCOSE 84 02/20/2022 0809   GLUCOSE 97 08/01/2021 1417   BUN 15 02/20/2022 0809   CREATININE 0.96 02/20/2022 0809   CREATININE 0.94 06/29/2021 1105   CALCIUM 9.2 02/20/2022 0809   GFRNONAA >60 08/01/2021 1417   GFRNONAA 93 06/30/2020 1105   GFRAA 108 06/30/2020 1105   Lab Results  Component Value Date   HGBA1C 5.8 (H) 02/20/2022   HGBA1C 5.7 (H) 08/20/2013   Lab Results  Component  Value Date   INSULIN 5.4 02/20/2022   INSULIN 4 09/16/2015   Lab Results  Component Value Date   TSH 1.040 02/20/2022   CBC    Component Value Date/Time   WBC 5.2 01/12/2022 1245   WBC 4.4 08/01/2021 1417   RBC 3.90 01/12/2022 1247   RBC 3.91 01/12/2022 1245   HGB 12.8 01/12/2022 1245   HGB 10.9 (L) 09/18/2018 0853   HGB 12.6 07/09/2016 1529   HCT 38.4 01/12/2022 1245   HCT 42.7 11/20/2021 1104   HCT 38.2 07/09/2016 1529   PLT 219 01/12/2022 1245   PLT 233 09/18/2018 0853   MCV 98.2 01/12/2022 1245   MCV 94 09/18/2018 0853   MCV  96.9 07/09/2016 1529   MCH 32.7 01/12/2022 1245   MCHC 33.3 01/12/2022 1245   RDW 13.6 01/12/2022 1245   RDW 14.8 09/18/2018 0853   RDW 13.5 07/09/2016 1529   Iron Studies    Component Value Date/Time   IRON 123 01/12/2022 1245   IRON 49 09/18/2018 0853   TIBC 343 01/12/2022 1245   TIBC 415 09/18/2018 0853   FERRITIN 21 01/12/2022 1246   FERRITIN 11 (L) 09/18/2018 0853   IRONPCTSAT 36 (H) 01/12/2022 1245   IRONPCTSAT 40 06/29/2021 1105   Lipid Panel     Component Value Date/Time   CHOL 191 02/20/2022 0809   TRIG 43 02/20/2022 0809   HDL 84 02/20/2022 0809   CHOLHDL 2.8 01/04/2022 1152   VLDL 9 03/12/2016 1038   LDLCALC 98 02/20/2022 0809   LDLCALC 98 01/04/2022 1152   Hepatic Function Panel     Component Value Date/Time   PROT 6.9 02/20/2022 0809   ALBUMIN 4.0 02/20/2022 0809   AST 21 02/20/2022 0809   AST 26 10/16/2018 0903   ALT 33 (H) 02/20/2022 0809   ALT 26 10/16/2018 0903   ALKPHOS 70 02/20/2022 0809   BILITOT 0.3 02/20/2022 0809   BILITOT 0.5 10/16/2018 0903   BILIDIR 0.1 03/16/2013 0740      Component Value Date/Time   TSH 1.040 02/20/2022 0809   Nutritional Lab Results  Component Value Date   VD25OH 50.1 02/20/2022   VD25OH 29.4 (L) 11/20/2021   VD25OH 18.9 (L) 07/07/2020    ASSOCIATED CONDITIONS ADDRESSED TODAY  ASSESSMENT AND PLAN  Problem List Items Addressed This Visit     Morbid obesity ([place])   Vitamin D deficiency - Primary    Vitamin D Deficiency Vitamin D is at goal of 56.  Most recent vitamin D level was 50.1. She is on  prescription ergocalciferol 50,000 IU twice weekly. Lab Results  Component Value Date   VD25OH 50.1 02/20/2022   VD25OH 29.4 (L) 11/20/2021   VD25OH 18.9 (L) 07/07/2020   Plan: Continue prescription vitamin D 50,000 IU twice weekly. Recheck vit D level at least 2-3 times yearly to avoid over supplementation.        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   Plan: will check labs today as noted. May need to see general surgery for further evaluation as well.  Encouraged to continue to work on adequate protein and fluid intake.  Seeing Dr. [name] as well for work up for celiac dz with lymphocytic colitis per patient.       Relevant Orders   Hemoglobin A1c   Insulin, random   Class 3 severe obesity with body mass index (BMI) of 40.0 to 44.9 in adult [facility])  Generalized abdominal pain    She has been having difficulty with persistent diarrhea/abdominal pain and nausea over the past couple of months.  She was evaluated by Dr. 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 I do not have a copy of the path report. History is further complicated by history of hidradenitis suppurative, takes Humira for this but has recently been on Bactrim DS for a flare over the past several weeks.  She does have a history of C. difficile in the past.  She is going in for celiac panel today at Dr. Lorie Apley office and I asked her to also ask to be checked for C. difficile. Will also check labs, CMET, Amylase/Lipase/CBC today.  She is going to continue to work on her nutrition plan to get adequate protein and calories and maintain hydration with frequent diarrhea.   She may need further studies, follow up with general surgery to evaluate for post gastrectomy dumping syndrome as well.        Relevant Orders   CBC with Differential/Platelet   CMP14+EGFR   Amylase   Lipase      TREATMENT PLAN FOR OBESITY:  Recommended Dietary Goals  Jann is currently in the action stage of change. As such, her goal is to continue weight management plan. She has agreed to practicing portion control and making smarter food choices, such as increasing vegetables and decreasing simple carbohydrates.  Behavioral Intervention  We discussed the  following Behavioral Modification Strategies today: increasing lean protein intake, decreasing simple carbohydrates , increasing vegetables, avoiding skipping meals, increasing water intake, work on meal planning and easy cooking plans, planning for success, and keeping healthy foods at home.  Additional resources provided today: NA  Recommended Physical Activity Goals  Mollie has been advised to work up to 150 minutes of moderate intensity aerobic activity a week and strengthening exercises 2-3 times per week for cardiovascular health, weight loss maintenance and preservation of muscle mass.   She has agreed to Continue current level of physical activity     Return in about 3 weeks (around 06/14/2022).Marland Kitchen She was informed of the importance of frequent follow up visits to maximize her success with intensive lifestyle modifications for her multiple health conditions.   ATTESTASTION STATEMENTS:  Reviewed by clinician on day of visit: allergies, medications, problem list, medical history, surgical history, family history, social history, and previous encounter notes.   I have personally spent 50 minutes total time today in preparation, patient care, nutritional counseling and documentation for this visit, including the following: review of clinical lab tests; review of medical tests/procedures/services.      Riordan Walle, PA-C

## 2022-05-24 NOTE — Assessment & Plan Note (Signed)
Vitamin D Deficiency Vitamin D is at goal of 50.  Most recent vitamin D level was 50.1. She is on  prescription ergocalciferol 50,000 IU twice weekly. Lab Results  Component Value Date   VD25OH 50.1 02/20/2022   VD25OH 29.4 (L) 11/20/2021   VD25OH 18.9 (L) 07/07/2020    Plan: Continue prescription vitamin D 50,000 IU twice weekly. Recheck vit D level at least 2-3 times yearly to avoid over supplementation.

## 2022-05-24 NOTE — Assessment & Plan Note (Signed)
Continued difficulty with nausea, diarrhea and abdominal pain as noted.   Plan: will check labs today as noted. May need to see general surgery for further evaluation as well.  Encouraged to continue to work on adequate protein and fluid intake.  Seeing Dr. [name] as well for work up for celiac dz with lymphocytic colitis per patient.

## 2022-05-24 NOTE — Assessment & Plan Note (Addendum)
She has been having difficulty with persistent diarrhea/abdominal pain and nausea over the past couple of months.  She was evaluated by Dr. 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 I do not have a copy of the path report. History is further complicated by history of hidradenitis suppurative, takes Humira for this but has recently been on Bactrim DS for a flare over the past several weeks.  She does have a history of C. difficile in the past.  She is going in for celiac panel today at Dr. Lorie Apley office and I asked her to also ask to be checked for C. difficile. Will also check labs, CMET, Amylase/Lipase/CBC today.  She is going to continue to work on her nutrition plan to get adequate protein and calories and maintain hydration with frequent diarrhea.   She may need further studies, follow up with general surgery to evaluate for post gastrectomy dumping syndrome as well.

## 2022-05-26 LAB — CBC WITH DIFFERENTIAL/PLATELET
Basophils Absolute: 0 10*3/uL (ref 0.0–0.2)
Basos: 1 %
EOS (ABSOLUTE): 0.1 10*3/uL (ref 0.0–0.4)
Eos: 3 %
Hematocrit: 37.4 % (ref 34.0–46.6)
Hemoglobin: 12 g/dL (ref 11.1–15.9)
Immature Grans (Abs): 0 10*3/uL (ref 0.0–0.1)
Immature Granulocytes: 0 %
Lymphocytes Absolute: 1.5 10*3/uL (ref 0.7–3.1)
Lymphs: 42 %
MCH: 32.7 pg (ref 26.6–33.0)
MCHC: 32.1 g/dL (ref 31.5–35.7)
MCV: 102 fL — ABNORMAL HIGH (ref 79–97)
Monocytes Absolute: 0.4 10*3/uL (ref 0.1–0.9)
Monocytes: 11 %
Neutrophils Absolute: 1.5 10*3/uL (ref 1.4–7.0)
Neutrophils: 43 %
Platelets: 192 10*3/uL (ref 150–450)
RBC: 3.67 x10E6/uL — ABNORMAL LOW (ref 3.77–5.28)
RDW: 12.2 % (ref 11.7–15.4)
WBC: 3.5 10*3/uL (ref 3.4–10.8)

## 2022-05-26 LAB — CMP14+EGFR
ALT: 24 IU/L (ref 0–32)
AST: 18 IU/L (ref 0–40)
Albumin/Globulin Ratio: 1.4 (ref 1.2–2.2)
Albumin: 4.1 g/dL (ref 3.9–4.9)
Alkaline Phosphatase: 63 IU/L (ref 44–121)
BUN/Creatinine Ratio: 21 (ref 12–28)
BUN: 20 mg/dL (ref 8–27)
Bilirubin Total: <0.2 mg/dL (ref 0.0–1.2)
CO2: 20 mmol/L (ref 20–29)
Calcium: 8.6 mg/dL — ABNORMAL LOW (ref 8.7–10.3)
Chloride: 105 mmol/L (ref 96–106)
Creatinine, Ser: 0.94 mg/dL (ref 0.57–1.00)
Globulin, Total: 3 g/dL (ref 1.5–4.5)
Glucose: 82 mg/dL (ref 70–99)
Potassium: 4.3 mmol/L (ref 3.5–5.2)
Sodium: 141 mmol/L (ref 134–144)
Total Protein: 7.1 g/dL (ref 6.0–8.5)
eGFR: 69 mL/min/{1.73_m2} (ref >59)

## 2022-05-26 LAB — HEMOGLOBIN A1C
Est. average glucose Bld gHb Est-mCnc: 114 mg/dL
Hgb A1c MFr Bld: 5.6 % (ref 4.8–5.6)

## 2022-05-26 LAB — AMYLASE: Amylase: 62 U/L (ref 31–110)

## 2022-05-26 LAB — LIPASE: Lipase: 27 U/L (ref 14–72)

## 2022-05-26 LAB — INSULIN, RANDOM: INSULIN: 7.1 u[IU]/mL (ref 2.6–24.9)

## 2022-05-28 DIAGNOSIS — G4733 Obstructive sleep apnea (adult) (pediatric): Secondary | ICD-10-CM | POA: Diagnosis not present

## 2022-05-29 ENCOUNTER — Other Ambulatory Visit (HOSPITAL_COMMUNITY): Payer: Self-pay

## 2022-05-29 ENCOUNTER — Ambulatory Visit
Admission: RE | Admit: 2022-05-29 | Discharge: 2022-05-29 | Disposition: A | Discharge status: Home / Self Care | Payer: 59 | Source: Ambulatory Visit | Attending: Internal Medicine | Admitting: Internal Medicine

## 2022-05-29 DIAGNOSIS — L249 Irritant contact dermatitis, unspecified cause: Secondary | ICD-10-CM | POA: Diagnosis not present

## 2022-05-29 DIAGNOSIS — L732 Hidradenitis suppurativa: Secondary | ICD-10-CM | POA: Diagnosis not present

## 2022-05-29 DIAGNOSIS — Z1231 Encounter for screening mammogram for malignant neoplasm of breast: Secondary | ICD-10-CM

## 2022-05-29 MED ORDER — TRIAMCINOLONE ACETONIDE 0.1 % EX CREA
1.0000 | TOPICAL_CREAM | Freq: Two times a day (BID) | CUTANEOUS | 1 refills | Status: DC
  Filled 2022-05-29: qty 80, 30d supply, fill #0

## 2022-05-30 ENCOUNTER — Other Ambulatory Visit: Payer: Self-pay

## 2022-05-31 ENCOUNTER — Other Ambulatory Visit: Payer: Self-pay

## 2022-05-31 ENCOUNTER — Other Ambulatory Visit: Payer: Self-pay | Admitting: Obstetrics and Gynecology

## 2022-05-31 ENCOUNTER — Ambulatory Visit: Payer: 59 | Attending: Cardiology | Admitting: Cardiology

## 2022-05-31 ENCOUNTER — Encounter (HOSPITAL_COMMUNITY): Payer: Self-pay

## 2022-05-31 ENCOUNTER — Encounter: Payer: Self-pay | Admitting: Obstetrics and Gynecology

## 2022-05-31 ENCOUNTER — Other Ambulatory Visit (HOSPITAL_COMMUNITY): Payer: Self-pay

## 2022-05-31 ENCOUNTER — Other Ambulatory Visit: Payer: Self-pay | Admitting: Internal Medicine

## 2022-05-31 VITALS — BP 113/64 | HR 55 | Ht 67.0 in | Wt 273.0 lb

## 2022-05-31 DIAGNOSIS — R079 Chest pain, unspecified: Secondary | ICD-10-CM

## 2022-05-31 DIAGNOSIS — R002 Palpitations: Secondary | ICD-10-CM

## 2022-05-31 DIAGNOSIS — I4719 Other supraventricular tachycardia: Secondary | ICD-10-CM

## 2022-05-31 DIAGNOSIS — Z5181 Encounter for therapeutic drug level monitoring: Secondary | ICD-10-CM

## 2022-05-31 DIAGNOSIS — R0609 Other forms of dyspnea: Secondary | ICD-10-CM | POA: Diagnosis not present

## 2022-05-31 DIAGNOSIS — N951 Menopausal and female climacteric states: Secondary | ICD-10-CM

## 2022-05-31 MED ORDER — FLECAINIDE ACETATE 50 MG PO TABS
50.0000 mg | ORAL_TABLET | Freq: Every day | ORAL | 3 refills | Status: DC
  Filled 2022-05-31 – 2022-10-17 (×2): qty 90, 90d supply, fill #0

## 2022-05-31 MED ORDER — METOPROLOL SUCCINATE ER 25 MG PO TB24
25.0000 mg | ORAL_TABLET | Freq: Every day | ORAL | 3 refills | Status: DC
  Filled 2022-05-31: qty 90, fill #0
  Filled 2022-07-02: qty 90, 90d supply, fill #0
  Filled 2022-10-17: qty 90, 90d supply, fill #1

## 2022-05-31 MED ORDER — MONTELUKAST SODIUM 10 MG PO TABS
10.0000 mg | ORAL_TABLET | Freq: Every day | ORAL | 3 refills | Status: DC
  Filled 2022-05-31: qty 30, 30d supply, fill #0
  Filled 2022-07-02: qty 30, 30d supply, fill #1

## 2022-05-31 NOTE — Telephone Encounter (Signed)
Can you see if we can get approval from her insurance for the divigel? She has a h/o gastric bypass and doesn't absorb oral medication well and has had a reaction to the patch.  Thank you!

## 2022-05-31 NOTE — Patient Instructions (Signed)
Medication Instructions:  Your physician recommends that you continue on your current medications as directed. Please refer to the Current Medication list given to you today.  *If you need a refill on your cardiac medications before your next appointment, please call your pharmacy*   Lab Work: None.  If you have labs (blood work) drawn today and your tests are completely normal, you will receive your results only by: MyChart Message (if you have MyChart) OR A paper copy in the mail If you have any lab test that is abnormal or we need to change your treatment, we will call you to review the results.   Testing/Procedures: None.   Follow-Up:   Your next appointment:   1 year(s)  Provider:   Traci Turner, MD     

## 2022-05-31 NOTE — Progress Notes (Signed)
Virtual Visit via Video Note   Because of Victoria [name]'s co-morbid illnesses, she is at least at moderate risk for complications without adequate follow up.  This format is felt to be most appropriate for this patient at this time.  All issues noted in this document were discussed and addressed.  A limited physical exam was performed with this format.  Please refer to the patient's chart for her consent to telehealth for [facility].  Date:  05/31/2022   ID:  Victoria [name], DOB 06-Nov-1960, MRN [id number] The patient was identified using 2 identifiers.  Patient Location: Home Provider Location: Home Office   PCP:  Baxley, Cresenciano Lick, MD   [facility] Providers Cardiologist:  Fransico Him, MD     Evaluation Performed:  Follow-Up Visit  Chief Complaint:  Palpitations   History of Present Illness:    Victoria [name] is a 62 y.o. female with a history of GERD, hiatal hernia and chest pain.  She was initially see by me several years ago for chest pain and nuclear stress test showed low risk study with a very small anterior perfusion defect that most likely represents breast attenuation artifact. Less likely this could represent a true perfusion abnormality in the distribution of a diagonal artery branch of the LAD.  Her EF was normal.  She was started on Protonix for possible GERD symptoms and CP resolved.   She had a gastric bypass in 2016 and had not felt well since then.  Her DOE was with talking and walking and also was having some epigastric pain at night.  2D echo was normal which was normal. She had an ETT done which showed no ischemia.     Repeat 2D echo 3/22 was normal and coronary CTA showed no CAD and Ca score of 0 on 2/22.  She wore an event monitor which showed PVCs, PACs, nonsustained atrial tachycardia, NSVT and she was placed on Toprol XL 25mg  daily and ultimately flecainide 50 mg twice daily was added as well for ongoing  palpitations.    At last office visit she complained of excessive daytime a sleep sleepiness and given her morbid obesity I was concerned she may have sleep apnea.  Sleep Study showed mild OSA with AHI 11/hr and O2 sats as low as 60% and is now on CPAP and followup by Pulmonary.   She is here today for followup and is doing well.  She denies any chest pain or pressure, PND, orthopnea, dizziness, palpitations or syncope. She occasionally has some ankle edema at the end of the day that is gone by morning.  She has had some SOB that is related to allergy season and usually occurs each year.  She is compliant with her meds and is tolerating meds with no SE.    Past Medical History:  Diagnosis Date   Back pain    Bursitis    right shoulder   Chronic leukopenia    intermittant since 2010, followed by Dr. Renold Genta now   Chronic neck pain    Constipation    Degenerative joint disease of low back 09/02/2015   Dr. Maureen Ralphs   Displacement of lumbar intervertebral disc    Edema of both lower legs    Fibrocystic breast    Gallbladder problem    GERD (gastroesophageal reflux disease)    Headache    per pt more stress/tension   Heart palpitations    SEE EPIC [place] ,  CARDIOLOGY DR. Fransico Him 2018; reports on 05-16-17 " i haven't had any bouts of those lately"     Hemorrhoids    Hidradenitis suppurativa    History of Clostridium difficile infection 12/2010   History of exercise intolerance    ETT on 04-27-2016-- negative ([facility] treadmill score 7)   History of Helicobacter pylori infection 2001 and 09/ 2012   HSV-2 infection    genital   Hx of adenomatous colonic polyps 10/17/2007   Hydradenitis    per pt currently treated with Humira injection   Hyperthyroidism    Hypocupremia    Hypothyroidism    Hypothyroidism, postsurgical 1980   IBS (irritable bowel syndrome)    Insomnia    Iron deficiency    Joint pain    Leukopenia    Lumbosacral radiculopathy    Medial meniscus tear    right  knee   Mild obstructive sleep apnea    Mitral regurgitation    Mild to moderate by echo 09/2021   Numbness and tingling of right arm    Obesity    Osteoarthritis    Palpitations    Pernicious anemia    b12 def   Peroneal neuropathy    PONV (postoperative nausea and vomiting)    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   Prediabetes    Reactive hypoglycemia followed by pcp   post gastrectomy dumping syndrome   SOB (shortness of breath)    Spinal stenosis    Tendonitis    right shoulder   Varicose veins    Vertigo    Vitamin B 12 deficiency    Vitamin D deficiency    Past Surgical History:  Procedure Laterality Date   BREATH TEK H PYLORI N/A 05/03/2014   Procedure: BREATH TEK H PYLORI;  Surgeon: Kaylyn Lim, MD;  Location: [facility];  Service: General;  Laterality: N/A;   CARDIOVASCULAR STRESS TEST  03/11/2013   dr Tressia Miners Hillery Zachman   Low risk nuclear study w/ a very small anterior perfusion defect that most likely represents breast attenuation artifact, less likely this could represent a true perfusion abnormality in the distribution of diagonal branch of the LAD/  normal LV function and wall motion , ef 66%   CATARACT EXTRACTION W/ INTRAOCULAR LENS  IMPLANT, BILATERAL  10/2017   [place]   COLONOSCOPY  02/2017   Dr [name] ; per patient they found 7 polyps with polypectomy; on 5 year track    ESOPHAGOGASTRODUODENOSCOPY  02/01/2021   FINE NEEDLE ASPIRATION Left 05/22/2017   Procedure: LEFT KNEE ASPIRATION;  Surgeon: Gaynelle Arabian, MD;  Location: [facility];  Service: Orthopedics;  Laterality: Left;   GASTRIC ROUX-EN-Y N/A 07/06/2014   Procedure: LAPAROSCOPIC ROUX-EN-Y GASTRIC BYPASS, ENTEROLYSIS OF ADHESIONS WITH UPPER ENDOSCOPY;  Surgeon: Johnathan Hausen, MD;  Location: [facility];  Service: General;  Laterality: N/A;   HAMMER TOE SURGERY Bilateral 02/17/2008   bilateral foot digit's 2 and 5   HIATAL HERNIA REPAIR N/A 07/06/2014   Procedure:  LAPAROSCOPIC REPAIR OF HIATAL HERNIA;  Surgeon: Johnathan Hausen, MD;  Location: [facility];  Service: General;  Laterality: N/A;   I & D KNEE WITH POLY EXCHANGE Left 12/11/2019   Procedure: LEFT KNEE POLY-LINER EXCHANGE;  Surgeon: Mcarthur Rossetti, MD;  Location: [place];  Service: Orthopedics;  Laterality: Left;   KNEE ARTHROSCOPY Right 12/11/2019   Procedure: RIGHT KNEE ARTHROSCOPY WITH PARTIAL MEDIAL MENISCECTOMY;  Surgeon: Mcarthur Rossetti, MD;  Location: [place];  Service: Orthopedics;  Laterality: Right;   KNEE ARTHROSCOPY WITH MEDIAL MENISECTOMY Left 07/17/2017   Procedure: LEFT KNEE ARTHROSCOPY WITH MEDIAL LATERAL MENISECTOMY;  Surgeon: Gaynelle Arabian, MD;  [facility]: [facility];  Service: Orthopedics;  Laterality: Left;   MASS EXCISION N/A 10/11/2016   Procedure: EXCISION OF PERIANAL MASS;  Surgeon: [name], MD;  Location: [place];  Service: General;  Laterality: N/A;   [place]  08-25-2002   dr Margaretha Glassing   ovaries retained   TOTAL HIP ARTHROPLASTY Right 05/21/2012   Procedure: TOTAL HIP ARTHROPLASTY;  Surgeon: Gearlean Alf, MD;  [facility]: [facility];  Service: Orthopedics;  Laterality: Right;   TOTAL HIP ARTHROPLASTY Left 01-31-2009  dr Wynelle Link   TOTAL HIP REVISION Left 05/22/2017   Procedure: Left hip bearing surface and head revision;  Surgeon: Gaynelle Arabian, MD;  [facility]: [facility];  Service: Orthopedics;  Laterality: Left;   TOTAL KNEE ARTHROPLASTY Left 01/20/2018   Procedure: LEFT TOTAL KNEE ARTHROPLASTY;  Surgeon: Gaynelle Arabian, MD;  [facility];  Service: Orthopedics;  Laterality: Left;  82min   TRANSTHORACIC ECHOCARDIOGRAM  05/03/2016   ef 55-60%/  trivial MR/  mild TR   TUBAL LIGATION       Current Meds  Medication Sig   Adalimumab (HUMIRA, 2 PEN,) 40 MG/0.8ML PNKT Inject 1 pen subcutaneously every week (Patient taking differently: once a week.)   albuterol (VENTOLIN HFA) 108  (90 Base) MCG/ACT inhaler Inhale 2 puffs into the lungs every 4 hours as needed for wheezing or shortness of breath.   Carbinoxamine Maleate 4 MG TABS Take 1-2 tablets at night as needed for drainage and allergies. May cause drowsiness.   celecoxib (CELEBREX) 200 MG capsule Take 1 capsule (200 mg total) by mouth 2 (two) times daily as needed. (Patient taking differently: Take 200 mg by mouth as needed.)   clobetasol ointment (TEMOVATE) 0.05 % Apply to skin twice daily as directed for up to 2 weeks as needed (Patient taking differently: Apply 1 Application topically as needed.)   cyanocobalamin (DODEX) 1000 MCG/ML injection INJECT 1 ML INTRAMUSCULARLY EVERY MONTH   ergocalciferol (VITAMIN D2) 1.25 MG (50000 UT) capsule Take 1 capsule (50,000 Units total) by mouth 2 (two) times a week for Vitamin deficiency and gastric bypass.   escitalopram (LEXAPRO) 10 MG tablet Take 1 tablet (10 mg total) by mouth daily.   Estradiol (DIVIGEL) 1.25 MG/1.25GM GEL Use one packet daily as directed   flecainide (TAMBOCOR) 50 MG tablet Take 50 mg by mouth daily at 6 (six) AM.   gabapentin (NEURONTIN) 600 MG tablet Take 1 tablet (600 mg total) by mouth 3 (three) times daily.   Galcanezumab-gnlm (EMGALITY) 120 MG/ML SOAJ Inject 120 mg into the skin every 30 (thirty) days.   ipratropium (ATROVENT) 0.03 % nasal spray Use 1-2 sprays in each nostril up to 3 times daily as needed for runny nose. Can take 15 minutes prior to meals.   levocetirizine (XYZAL) 5 MG tablet Take 1 tablet (5 mg total) by mouth every evening.   levothyroxine (SYNTHROID) 112 MCG tablet Take 1 tablet (112 mcg total) by mouth daily.   LORazepam (ATIVAN) 1 MG tablet Take 1 tablet (1 mg total) by mouth every 8 (eight) hours.   metoprolol succinate (TOPROL-XL) 25 MG 24 hr tablet TAKE 1 TABLET BY MOUTH DAILY.   montelukast (SINGULAIR) 10 MG tablet Take 1 tablet by mouth at bedtime.   Olopatadine  HCl 0.2 % SOLN Apply 1 drop to eye daily as needed.    pantoprazole (PROTONIX) 40 MG tablet Take 1 tablet by mouth daily 20 minutes before breakfast (Patient taking differently: Take by mouth as needed.)   promethazine (PHENERGAN) 25 MG tablet Take 1 tablet (25 mg total) by mouth every 8 (eight) hours as needed for N/V.   sulfamethoxazole-trimethoprim (BACTRIM DS) 800-160 MG tablet Take 1 tablet by mouth 2 (two) times daily as needed for flare (Patient taking differently: Take 1 tablet by mouth as needed.)   terconazole (TERAZOL 7) 0.4 % vaginal cream Place 1 applicatorful vaginally at bedtime for seven nights.   Then use one applicatorful once a week for 6 months.   tiZANidine (ZANAFLEX) 4 MG capsule Take 1 capsule (4 mg total) by mouth 3 (three) times daily as needed for muscle spasms.   triamcinolone cream (KENALOG) 0.1 % Apply 1 Application topically 2 (two) times daily. Use for 2 weeks, and then off for 2 weeks as needed.   Ubrogepant (UBRELVY) 100 MG TABS Take 100 mg by mouth every 2 (two) hours as needed. Maximum 200mg  a day.     Allergies:   Other and Estradiol   Social History   Tobacco Use   Smoking status: Never   Smokeless tobacco: Never  Vaping Use   Vaping Use: Never used  Substance Use Topics   Alcohol use: No   Drug use: No     Family Hx: The patient's family history includes Diabetes in her father and sister; Headache in her mother; Heart disease in her father; High Cholesterol in her father and mother; High blood pressure in her father and mother; Hypertension in her father, mother, and sister; Kidney disease in her mother; Obesity in her father and mother; Thyroid disease in her brother, maternal aunt, and mother. There is no history of Migraines.  ROS:   Please see the history of present illness.     All other systems reviewed and are negative.   Prior Sleep studies:   The following studies were reviewed today:  none  Labs/Other Tests and Data Reviewed:     Recent Labs: 11/20/2021: Magnesium 2.5 02/20/2022:  TSH 1.040 05/24/2022: ALT 24; BUN 20; Creatinine, Ser 0.94; Hemoglobin 12.0; Platelets 192; Potassium 4.3; Sodium 141   Wt Readings from Last 3 Encounters:  05/31/22 273 lb (123.8 kg)  05/24/22 273 lb (123.8 kg)  05/18/22 274 lb 12.8 oz (124.6 kg)     Risk Assessment/Calculations:      Objective:    Vital Signs:  BP 113/64   Pulse (!) 55   Ht 5\' 7"  (1.702 m)   Wt 273 lb (123.8 kg)   SpO2 94%   BMI 42.76 kg/m    VITAL SIGNS:  reviewed GEN:  no acute distress EYES:  sclerae anicteric, EOMI - Extraocular Movements Intact RESPIRATORY:  normal respiratory effort, symmetric expansion CARDIOVASCULAR:  no peripheral edema SKIN:  no rash, lesions or ulcers. MUSCULOSKELETAL:  no obvious deformities. NEURO:  alert and oriented x 3, no obvious focal deficit PSYCH:  normal affect  ASSESSMENT & PLAN:     Palpitations/Nonsustained atrial tachycardia -event monitor 04/2020 showed PVCs, PACs, nonsustained atrial tachycardia and nonsustained VT -coronary CTA showed no CAD and echo showed normal LVF -She has not had any further palpitations on beta-blocker and flecainide -Intervals on EKG from 10/2021 are stable -Continue prescription drug management with Toprol-XL 25 mg daily and flecainide 50 mg every morning and an extra dose for breakthrough  when needed with as needed refills   Chest pain -Coronary CTA was completely normal in February 2022 and 2D echo was normal -Likely related to GERD as improved on Protonix>>CP has resolved -She has not had any further chest pain   DOE -2Decho 04/2020 showed normal LVF and no CAD -likely related to morbid obesity and sedentary state -likely related to allergies as it is seasona -she does have OSA and is on CPAP followup by Pulmonary  Preoperative Cardiac Clearance -she is having right total knee replacement -According to the Revised Cardiac Risk Index (RCRI), her Perioperative Risk of Major Cardiac Event is (%): 0.4 which is low and ok to  proceed with surgery  Her Functional Capacity in METs is: 5.07 according to the [facility] Activity Status Index (DASI).Therefore no further ischemic workup needed.   Total time of encounter: 20 minutes total time of encounter, including 15 minutes spent in face-to-face patient care on the date of this encounter. This time includes coordination of care and counseling regarding above mentioned problem list. Remainder of non-face-to-face time involved reviewing chart documents/testing relevant to the patient encounter and documentation in the medical record. I have independently reviewed documentation from referring provider.    Medication Adjustments/Labs and Tests Ordered: Current medicines are reviewed at length with the patient today.  Concerns regarding medicines are outlined above.   Tests Ordered: No orders of the defined types were placed in this encounter.   Medication Changes: No orders of the defined types were placed in this encounter.   Follow Up:  In Person in 1 year(s)  Signed, Fransico Him, MD  05/31/2022 9:20 AM    [place]

## 2022-05-31 NOTE — Addendum Note (Signed)
Addended by: Joni Reining on: 05/31/2022 10:19 AM   Modules accepted: Orders

## 2022-05-31 NOTE — Telephone Encounter (Addendum)
Erroneous encounter

## 2022-05-31 NOTE — Telephone Encounter (Signed)
Last AEX 07/12/2021--scheduled for 07/18/2022. Last mammo 05/29/2022-neg birads 1  FYI. I was able to pull up GoodRx coupon for pt for the generic of the divigel. Cheapest I was able to find for 3 month supply would be from [facility] @ $111.44.Marland Kitchen

## 2022-06-01 ENCOUNTER — Other Ambulatory Visit: Payer: Self-pay

## 2022-06-01 ENCOUNTER — Other Ambulatory Visit (HOSPITAL_COMMUNITY): Payer: Self-pay

## 2022-06-01 NOTE — Telephone Encounter (Signed)
PA initiated with CoverMyMeds Key: [id number].

## 2022-06-05 ENCOUNTER — Other Ambulatory Visit (HOSPITAL_COMMUNITY): Payer: Self-pay

## 2022-06-06 ENCOUNTER — Other Ambulatory Visit (HOSPITAL_COMMUNITY): Payer: Self-pay

## 2022-06-09 ENCOUNTER — Other Ambulatory Visit (HOSPITAL_COMMUNITY): Payer: Self-pay

## 2022-06-11 ENCOUNTER — Other Ambulatory Visit (HOSPITAL_COMMUNITY): Payer: Self-pay

## 2022-06-11 ENCOUNTER — Encounter (INDEPENDENT_AMBULATORY_CARE_PROVIDER_SITE_OTHER): Payer: Self-pay | Admitting: Internal Medicine

## 2022-06-12 ENCOUNTER — Encounter: Payer: Self-pay | Admitting: Family

## 2022-06-12 ENCOUNTER — Other Ambulatory Visit (HOSPITAL_COMMUNITY): Payer: Self-pay

## 2022-06-12 ENCOUNTER — Other Ambulatory Visit: Payer: Self-pay

## 2022-06-12 NOTE — Telephone Encounter (Signed)
Spoke with patient. She just wanted to let Dr. Rikki Spearing aware of cravings and that she was scheduled with Shawn after her last visit. She wants to see Dr. Dahlia Bailiff and will call to make an appt with him.

## 2022-06-14 ENCOUNTER — Telehealth: Payer: Self-pay | Admitting: Orthopaedic Surgery

## 2022-06-14 ENCOUNTER — Other Ambulatory Visit: Payer: Self-pay

## 2022-06-14 ENCOUNTER — Other Ambulatory Visit (HOSPITAL_COMMUNITY): Payer: Self-pay

## 2022-06-14 MED ORDER — BUDESONIDE 3 MG PO CPEP
9.0000 mg | ORAL_CAPSULE | Freq: Every morning | ORAL | 3 refills | Status: AC
  Filled 2022-06-14: qty 270, 90d supply, fill #0
  Filled 2022-10-17: qty 270, 90d supply, fill #1

## 2022-06-14 NOTE — Telephone Encounter (Signed)
[employer] forms received. To [employer]. 

## 2022-06-15 ENCOUNTER — Encounter: Payer: Self-pay | Admitting: Family

## 2022-06-15 ENCOUNTER — Other Ambulatory Visit (HOSPITAL_COMMUNITY): Payer: Self-pay

## 2022-06-15 MED ORDER — ESTRADIOL 0.75 MG/1.25 GM (0.06%) TD GEL
1.2500 g | Freq: Every day | TRANSDERMAL | 0 refills | Status: DC
  Filled 2022-06-15: qty 100, 80d supply, fill #0
  Filled 2022-08-14: qty 50, 30d supply, fill #0
  Filled 2022-08-14: qty 150, 90d supply, fill #0

## 2022-06-15 NOTE — Telephone Encounter (Signed)
PA for brand and generic was denied as of 06/04/2022: "Prior authorization requests requiring clinical review are completed by a licensed pharmacist or physician clinical reviewer. Based upon the information we received from you/your healthcare practitioner, we are unable to authorize ESTRADIOL 0.1% (1.25MG ) GEL PK, generic for DIVIGEL 1.25 MG GEL PACKET at this time. The reason(s) for this determination is (are): We were asked to perform a prior authorization for coverage of the drug or product listed at the top of this letter under your pharmacy benefit. We denied this request because your plan does not allow certain drugs or classes of drugs to be covered under your pharmacy benefit. You may contact your plan for a list of excluded drugs or products. We denied this request based on the General Exclusion section of the [employer] Formulary. Covered alternatives may include EstroGel transdermal (absorbed through the skin) gel, which may also require a prior authorization. Please work with your provider to discuss other treatment options."  There is an option to appeal the decision. However, they were made aware in the initial PA that "Patient has a history of gastric bypass surgery so she does not absorb oral medication well and has had a reaction to patch in the past."   Please advise.

## 2022-06-16 ENCOUNTER — Other Ambulatory Visit (HOSPITAL_COMMUNITY): Payer: Self-pay

## 2022-06-18 ENCOUNTER — Other Ambulatory Visit (HOSPITAL_COMMUNITY): Payer: Self-pay

## 2022-06-19 ENCOUNTER — Ambulatory Visit (INDEPENDENT_AMBULATORY_CARE_PROVIDER_SITE_OTHER): Payer: 59 | Admitting: Physician Assistant

## 2022-06-19 ENCOUNTER — Encounter: Payer: Self-pay | Admitting: Family

## 2022-06-19 ENCOUNTER — Other Ambulatory Visit (HOSPITAL_COMMUNITY): Payer: Self-pay

## 2022-06-20 ENCOUNTER — Other Ambulatory Visit: Payer: Self-pay | Admitting: Internal Medicine

## 2022-06-20 MED ORDER — LEVOTHYROXINE SODIUM 112 MCG PO TABS
112.0000 ug | ORAL_TABLET | Freq: Every day | ORAL | 1 refills | Status: DC
  Filled 2022-06-20: qty 90, 90d supply, fill #0
  Filled 2022-09-19: qty 90, 90d supply, fill #1

## 2022-06-21 ENCOUNTER — Other Ambulatory Visit (HOSPITAL_COMMUNITY): Payer: Self-pay

## 2022-06-21 ENCOUNTER — Other Ambulatory Visit: Payer: Self-pay

## 2022-06-21 NOTE — Progress Notes (Signed)
PA for Estrogel  0.75 was denied by they patients insurance. See patient message from 05/31/22.   Covermymeds  Key [id number]

## 2022-06-22 ENCOUNTER — Other Ambulatory Visit (HOSPITAL_COMMUNITY): Payer: Self-pay

## 2022-06-25 ENCOUNTER — Ambulatory Visit: Payer: 59 | Admitting: Family Medicine

## 2022-06-25 NOTE — Progress Notes (Signed)
Sent message, via epic in basket, requesting orders in epic from surgeon.  

## 2022-06-26 NOTE — Progress Notes (Signed)
Patient Care Team: Margaree Mackintosh, MD as PCP - General Quintella Reichert, MD as PCP - Cardiology (Cardiology) Linna Darner, RD as Dietitian (Family Medicine)  Visit Date: 07/09/22  Subjective:    Patient ID: Victoria [name] , Female   DOB: [birth year], 62 y.o.    MRN: [id number]   62 y.o. Female presents today for annual comprehensive physical exam. Patient has a past medical history of back pain, bursitis right shoulder, chronic leukopenia, neck pain, constipation, degenerative joint disease lower back, displacement lumbar intervertebral disc, lower extremity edema, fibrocystic breast disease, GERD, headaches, heart palpitations, hemorrhoids, hidradenitis suppurativa, C-diff infection 2012, H-pylori infection, hypothyroidism, hypocupremia, insomnia, iron deficiency, leukopenia, lumbosacral radiculopathy, medial meniscus tear, mild obstructive sleep apnea, mitral regurgitation, obesity, osteoarthritis, prediabetes, spinal stenosis, varicose veins, vertigo, Vitamin B12 deficiency, Vitamin D deficiency.  Pending right total knee arthroplasty with Dr. Magnus Ivan, 07/13/22. Cleared by cardiology on 05/31/2022. Had arthroscopy of right knee in October 2021 for history of acute medial meniscal tear. History of left knee arthroplasty.   History of musculoskeletal pain treated with celecoxib 200 mg as needed, gabapentin 600 mg three times daily, tizanidine 4 mg three times daily. Has chronic back pain and has seen Dr. Murray Hodgkins.   History of anxiety and depression treated with escitalopram 10 mg daily, lorazepam 1 mg daily as needed. She is not experiencing adequate relief with lorazepam. Would like to change back to alprazolam.  History of Vitamin D deficiency treated with ergocalciferol 50,000 units twice weekly.  History of palpitations, nonsustained atrial tachycardia treated with flecainide 50 mg daily at 6am, metoprolol succinate 25 mg daily. Blood pressure normal today at 118/72. Followed by  cardiologist, Dr. Armanda Magic. She saw Dr. Mayford Knife in 2015 for chest pain. 2D echocardiogram was performed along with myocardial perfusion imaging which was normal. In March 2018 she had exercise tolerance test which was normal.   History of migraine headaches. In March 2019 she had MRI of the brain for headaches. Headaches can last up to 3 days per episode and was associated with some vertigo. 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 Symptoms are stable with Emgality 120 mg every 30 days. Followed by Dr. Lucia Gaskins.   History of hidradenitis suppurativa treated with Humira 40 mg weekly.   History of hypothyroidism treated with levothyroxine 112 mcg daily. TSH at 3.17 on 07/05/22.  History of asthma treated with montelukast 10 mg daily at bedtime.  History of lymphocytic colitis treated with budesonide 9 mg daily in the morning. She is followed by Dr. Loreta Ave, gastroenterologist.  History of Vitamin B12 deficiency treated with Dodex 1 mL monthly.  History of iron deficiency. In March 2022, ferritin was normal at 42. In August 2020 her ferritin level was 5. Received iron infusions through oncology. Iron level in November 2021 was 88. Iron level was normal at 124 in March 2022. Iron level in August 2022 was normal at 105.   After gastric bypass surgery she had reactive hypoglycemia in 2017.  She saw Dr. Lucianne Muss, endocrinologist and improved.  He started her on acarbose before breakfast and lunch and she did fairly well.  It took a little bit of time for this to improve and this is no longer an issue.  History of gastric bypass Roux-en-Y laparoscopic surgery by Dr. Daphine Deutscher in 2016.  Weight before surgery was 296 pounds.    Had thyroidectomy in 1980 and is on thyroid replacement medication. Cholecystectomy 1987. Remote  history of adenomatous colon polyp. C-section for twin daughters in 37. Bilateral tubal ligation in the 1980s. Total abdominal hysterectomy 2004. Had left hip  arthroplasty December 2010. Right hip arthroplasty March 2014. Endoscopy for epigastric pain in 2012 by Dr. Juanda Chance. Upper endoscopy in 2012 showed chronic duodenitis which was treated with PPI. History of GE reflux.   Longstanding history of benign ethnic neutropenia.   Has had recurrent vaginitis issues seen by Dr. Leda Quail, GYN physician.   Glucose elevated at 106. Serum creatinine elevated at 1.08. Calcium low at 8.6. GFR low at 58. Electrolytes normal. Blood proteins normal. RBC low at 3.73. MCV elevated at 100.8. Lipid panel normal. HGBA1c elevated at 5.7% on 07/05/22, up from 5.6 % on 05/24/22.  Pap smear last completed 08/14/18. Negative for intraepithelial lesions or malignancy. Pelvic deferred to gynecologist. Dr. Annamaria Boots is GYN physician.  Mammogram last completed 05/29/22. No mammographic evidence of malignancy. Recommended repeat in 2025.  Colonoscopy last completed 05/14/22. Results showed one small sessile polyp in mid-ascending colon. Examination otherwise normal. Pathology showed diminutive tubular adenoma. Recommended repeat in 2031.  Bone density ordered by GYN physician in 2021 showed normal scores.   Social history: First marriage ended in divorce. She has remarried. Husband is disabled. She does not smoke or consume alcohol. 2 adult twin daughters. Patient works full-time with [employer] as a Designer, jewellery. She is planning to retire soon.  Family history: Mother living with history of diabetes and hypertension. Father deceased with history of diabetes. 2 brothers and 1 sister. Sister has hypothyroidism.   Past Medical History:  Diagnosis Date   Anxiety    Back pain    Bursitis    right shoulder   Chronic kidney disease    hematuria, has been worked up, her normal   Chronic leukopenia    intermittant since 2010, followed by Dr. Lenord Fellers now   Chronic neck pain    Constipation    Degenerative joint disease of low back 09/02/2015   Dr. Despina Hick    Displacement of lumbar intervertebral disc    Dysrhythmia    Tachycardia, PVCs, PACs   Edema of both lower legs    Fibrocystic breast    Gallbladder problem    GERD (gastroesophageal reflux disease)    Headache    per pt more stress/tension   Heart palpitations    SEE EPIC ENCOUTNER , CARDIOLOGY DR. Gloris Manchester TURNER 2018; reports on 05-16-17 " i haven't had any bouts of those lately"     Hemorrhoids    Hidradenitis suppurativa    History of Clostridium difficile infection 12/2010   History of exercise intolerance    ETT on 04-27-2016-- negative ([facility] treadmill score 7)   History of Helicobacter pylori infection 2001 and 09/ 2012   HSV-2 infection    genital   Hx of adenomatous colonic polyps 10/17/2007   Hydradenitis    per pt currently treated with Humira injection   Hypertension    Hyperthyroidism    Hypocupremia    Hypothyroidism    Hypothyroidism, postsurgical 1980   IBS (irritable bowel syndrome)    Insomnia    Iron deficiency    Joint pain    Leukopenia    Lumbosacral radiculopathy    Lymphocytic colitis 05/14/2022   dx from colonoscopy   Medial meniscus tear    right knee   Mild obstructive sleep apnea    Mitral regurgitation    Mild to moderate by echo 09/2021   Numbness and tingling  of right arm    Obesity    Osteoarthritis    Palpitations    Pernicious anemia    b12 def   Peroneal neuropathy    PONV (postoperative nausea and vomiting)    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   Prediabetes    Reactive hypoglycemia followed by pcp   post gastrectomy dumping syndrome   SOB (shortness of breath)    Spinal stenosis    Tendonitis    right shoulder   Varicose veins    Vertigo    Vitamin B 12 deficiency    Vitamin D deficiency      Family History  Problem Relation Age of Onset   High blood pressure Mother    Hypertension Mother    Headache Mother    High Cholesterol Mother    Thyroid disease Mother    Obesity Mother    Kidney disease Mother     Hypertension Father    Diabetes Father    High blood pressure Father    High Cholesterol Father    Heart disease Father    Obesity Father    Diabetes Sister    Hypertension Sister    Thyroid disease Brother    Thyroid disease Maternal Aunt    Migraines Neg Hx     Social History   Social History Narrative   Lives at home with spouse   Right handed   Caffeine: diet zero mtn dew, occasionally       Review of Systems  Constitutional:  Negative for chills, fever, malaise/fatigue and weight loss.  HENT:  Negative for hearing loss, sinus pain and sore throat.   Respiratory:  Negative for cough, hemoptysis and shortness of breath.   Cardiovascular:  Negative for chest pain, palpitations, leg swelling and PND.  Gastrointestinal:  Negative for abdominal pain, constipation, diarrhea, heartburn, nausea and vomiting.  Genitourinary:  Negative for dysuria, frequency and urgency.  Musculoskeletal:  Negative for back pain, myalgias and neck pain.  Skin:  Negative for itching and rash.  Neurological:  Negative for dizziness, tingling, seizures and headaches.  Endo/Heme/Allergies:  Negative for polydipsia.  Psychiatric/Behavioral:  Negative for depression. The patient is not nervous/anxious.         Objective:   Vitals: BP 118/72   Pulse 62   Temp 98.1 F (36.7 C) (Temporal)   Resp 16   Ht 5' 7.25" (1.708 m)   Wt 286 lb 6.4 oz (129.9 kg)   SpO2 98%   BMI 44.52 kg/m    Physical Exam Vitals and nursing note reviewed.  Constitutional:      General: She is not in acute distress.    Appearance: Normal appearance. She is not ill-appearing or toxic-appearing.  HENT:     Head: Normocephalic and atraumatic.     Right Ear: Hearing, tympanic membrane, ear canal and external ear normal.     Left Ear: Hearing, tympanic membrane, ear canal and external ear normal.     Mouth/Throat:     Pharynx: Oropharynx is clear.  Eyes:     Extraocular Movements: Extraocular movements intact.      Pupils: Pupils are equal, round, and reactive to light.  Neck:     Thyroid: No thyroid mass, thyromegaly or thyroid tenderness.     Vascular: No carotid bruit.  Cardiovascular:     Rate and Rhythm: Normal rate and regular rhythm. No extrasystoles are present.    Pulses:          Dorsalis pedis pulses  are 1+ on the right side and 1+ on the left side.     Heart sounds: Normal heart sounds. No murmur heard.    No friction rub. No gallop.  Pulmonary:     Effort: Pulmonary effort is normal.     Breath sounds: Normal breath sounds. No decreased breath sounds, wheezing, rhonchi or rales.  Chest:     Chest wall: No mass.  Abdominal:     Palpations: Abdomen is soft. There is no hepatomegaly, splenomegaly or mass.     Tenderness: There is no abdominal tenderness.     Hernia: No hernia is present.  Musculoskeletal:     Cervical back: Normal range of motion.     Right lower leg: No edema.     Left lower leg: No edema.  Lymphadenopathy:     Cervical: No cervical adenopathy.     Upper Body:     Right upper body: No supraclavicular adenopathy.     Left upper body: No supraclavicular adenopathy.  Skin:    General: Skin is warm and dry.  Neurological:     General: No focal deficit present.     Mental Status: She is alert and oriented to person, place, and time. Mental status is at baseline.     Sensory: Sensation is intact.     Motor: Motor function is intact. No weakness.     Deep Tendon Reflexes: Reflexes are normal and symmetric.  Psychiatric:        Attention and Perception: Attention normal.        Mood and Affect: Mood normal.        Speech: Speech normal.        Behavior: Behavior normal.        Thought Content: Thought content normal.        Cognition and Memory: Cognition normal.        Judgment: Judgment normal.       Results:   Studies obtained and personally reviewed by me:  Pap smear last completed 08/14/18. Negative for intraepithelial lesions or malignancy. Pelvic  deferred to gynecologist.  Mammogram last completed 05/29/22. No mammographic evidence of malignancy. Recommended repeat in 2025.  Colonoscopy last completed 05/14/22. Results showed one small sessile polyp in mid-ascending colon. Examination otherwise normal. Pathology showed diminutive tubular adenoma. Recommended repeat in 2031.  Bone density ordered by GYN physician in 2021 showed normal scores.   Labs:       Component Value Date/Time   NA 141 07/02/2022 1450   NA 141 05/24/2022 1122   K 4.2 07/02/2022 1450   CL 105 07/02/2022 1450   CO2 27 07/02/2022 1450   GLUCOSE 106 (H) 07/02/2022 1450   BUN 13 07/02/2022 1450   BUN 20 05/24/2022 1122   CREATININE 1.08 (H) 07/02/2022 1450   CREATININE 0.94 06/29/2021 1105   CALCIUM 8.6 (L) 07/02/2022 1450   PROT 8.0 07/02/2022 1450   PROT 7.1 05/24/2022 1122   ALBUMIN 4.1 07/02/2022 1450   ALBUMIN 4.1 05/24/2022 1122   AST 19 07/02/2022 1450   AST 26 10/16/2018 0903   ALT 28 07/02/2022 1450   ALT 26 10/16/2018 0903   ALKPHOS 52 07/02/2022 1450   BILITOT 0.4 07/02/2022 1450   BILITOT <0.2 05/24/2022 1122   BILITOT 0.5 10/16/2018 0903   GFRNONAA 58 (L) 07/02/2022 1450   GFRNONAA 93 06/30/2020 1105   GFRAA 108 06/30/2020 1105     Lab Results  Component Value Date   WBC 5.3 07/02/2022  HGB 12.2 07/02/2022   HCT 37.6 07/02/2022   MCV 100.8 (H) 07/02/2022   PLT 189 07/02/2022    Lab Results  Component Value Date   CHOL 197 07/05/2022   HDL 86 07/05/2022   LDLCALC 99 07/05/2022   TRIG 41 07/05/2022   CHOLHDL 2.3 07/05/2022    Lab Results  Component Value Date   HGBA1C 5.7 (H) 07/05/2022     Lab Results  Component Value Date   TSH 3.17 07/05/2022      Assessment & Plan:   Anxiety and depression: treated with escitalopram 10 mg daily, lorazepam 1 mg daily as needed. She was not happy with lorazepam and expressed a desire to go back to alprazolam. Prescribed alprazolam 0.5 mg once in the morning, one at noon and twice  at bedtime.  Musculoskeletal pain: treated with celecoxib 200 mg as needed, gabapentin 600 mg three times daily, tizanidine 4 mg three times daily.  Vitamin D deficiency: treated with ergocalciferol 50,000 units twice weekly.  Palpitations, nonsustained atrial tachycardia: treated with flecainide 50 mg daily at 6am, metoprolol succinate 25 mg daily. Blood pressure normal today at 118/72. Followed by cardiologist, Dr. Armanda Magic.   Migraine headaches: stable with Emgality 120 mg every 30 days. Followed by Dr. Lucia Gaskins.   Hidradenitis suppurativa: treated with Humira 40 mg weekly.   Hypothyroidism: treated with levothyroxine 112 mcg daily. TSH at 3.17 on 07/05/22.  Asthma: treated with montelukast 10 mg daily at bedtime.  Lymphocytic colitis: treated with budesonide 9 mg daily in the morning. She is followed by Dr. Loreta Ave, gastroenterologist.  Vitamin B12 deficiency: treated with Dodex 1 mL monthly.  Pap smear: last completed 08/14/18. Negative for intraepithelial lesions or malignancy. Pelvic deferred to gynecologist. Dr. Annamaria Boots is GYN physician.  Mammogram: last completed 05/29/22. No mammographic evidence of malignancy. Recommended repeat in 2025.  Colonoscopy: last completed 05/14/22. Results showed one small sessile polyp in mid-ascending colon. Examination otherwise normal. Pathology showed diminutive tubular adenoma. Recommended repeat in 2031.  Bone density ordered by GYN physician in 2021 showed normal scores.   Vaccine counseling: UTD on flu, tetanus, shingles vaccines.  Serum creatinine elevated at 1.08. Ordered serum creatinine.   Urinalysis showed trace leukocytes. Ordered urine culture.  Return in 1 year for health maintenance exam or as needed.    I,Alexander Ruley,acting as a Neurosurgeon for Margaree Mackintosh, MD.,have documented all relevant documentation on the behalf of Margaree Mackintosh, MD,as directed by  Margaree Mackintosh, MD while in the presence of Margaree Mackintosh,  MD.   I, Margaree Mackintosh, MD, have reviewed all documentation for this visit. The documentation on 07/15/22 for the exam, diagnosis, procedures, and orders are all accurate and complete.

## 2022-06-27 ENCOUNTER — Ambulatory Visit (INDEPENDENT_AMBULATORY_CARE_PROVIDER_SITE_OTHER): Payer: 59 | Admitting: Physician Assistant

## 2022-06-27 DIAGNOSIS — G4733 Obstructive sleep apnea (adult) (pediatric): Secondary | ICD-10-CM | POA: Diagnosis not present

## 2022-06-28 ENCOUNTER — Encounter: Payer: Self-pay | Admitting: Family Medicine

## 2022-06-28 ENCOUNTER — Ambulatory Visit: Payer: 59 | Admitting: Family Medicine

## 2022-06-28 ENCOUNTER — Encounter: Payer: Self-pay | Admitting: Orthopaedic Surgery

## 2022-06-28 ENCOUNTER — Other Ambulatory Visit: Payer: Self-pay

## 2022-06-28 VITALS — BP 118/78 | HR 57 | Temp 97.5°F | Resp 17

## 2022-06-28 DIAGNOSIS — H10403 Unspecified chronic conjunctivitis, bilateral: Secondary | ICD-10-CM | POA: Diagnosis not present

## 2022-06-28 DIAGNOSIS — J310 Chronic rhinitis: Secondary | ICD-10-CM

## 2022-06-28 NOTE — Progress Notes (Signed)
[address] [place] [address] Dept: [phone]  FOLLOW UP NOTE  Patient ID: Victoria [name], female    DOB: 13-Mar-1960  Age: 62 y.o. MRN: [id number] Date of Office Visit: 06/28/2022  Assessment  Chief Complaint: Allergy Testing and Allergic Rhinitis   HPI Victoria [name] is a 62 year old female who presents to the clinic for follow-up visit.  She was last seen in this clinic on 05/09/2022 by Dr. Maurine Minister for evaluation of chronic rhinitis, gustatory rhinitis, chronic conjunctivitis, and chronic cough.  Patch testing for metals was placed on 05/21/2022 and was negative at subsequent readings.  At today's visit, she reports that she has continued to experience symptoms of chronic rhinitis despite medications. She returns to the clinic for environmental allergy skin testing and reports that she has not taken any antihistamines for the last 3 days.  She is feeling relatively well overall with no cardiopulmonary, gastrointestinal, or integumentary symptoms at today's visit.  Her current medications are listed in the chart.  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  Constitutional:      Appearance: Normal appearance.  HENT:     Head: Normocephalic and atraumatic.     Right Ear: Tympanic membrane normal.     Left Ear: Tympanic membrane normal.     Nose:     Comments: Bilateral naris slightly erythematous with clear nasal drainage noted.  Pharynx normal.  Ears normal.  Eyes normal.    Mouth/Throat:     Pharynx: Oropharynx is clear.  Eyes:     Conjunctiva/sclera: Conjunctivae  normal.  Cardiovascular:     Rate and Rhythm: Normal rate and regular rhythm.     Heart sounds: Normal heart sounds. No murmur heard. Pulmonary:     Effort: Pulmonary effort is normal.     Breath sounds: Normal breath sounds.     Comments: Lungs clear to auscultation Musculoskeletal:        General: Normal range of motion.     Cervical back: Normal range of motion and neck supple.  Skin:    General: Skin is warm and dry.  Neurological:     Mental Status: She is alert and oriented to person, place, and time.  Psychiatric:        Mood and Affect: Mood normal.        Behavior: Behavior normal.        Thought Content: Thought content normal.        Judgment: Judgment normal.     Diagnostics: Percutaneous environmental allergy skin testing was negative to the adult environmental panel with adequate controls  Intradermal environmental allergy testing was negative to the panel with an adequate control  Assessment and Plan: 1. Chronic rhinitis   2. Chronic conjunctivitis of both eyes, unspecified chronic conjunctivitis type     Patient Instructions  Allergic rhinitis Your environmental allergy skin testing was negative at today's visit with adequate controls. Continue montelukast 10 mg once a day Continue Xyzal 5 mg once a day Continue carbinoxamine 4 mg tablets.  Take 1 to 2 tablets at bedtime Continue Flonase 2 sprays in each nostril once a day as needed for stuffy nose Continue Atrovent 2 sprays in each nostril twice a day as needed for runny nose A lab order has been placed to help [place] evaluate your environmental allergies.  We will call you when the result becomes available.  Allergic conjunctivitis Some over the counter eye drops include Pataday one drop in each eye once a day as needed for red, itchy eyes OR Zaditor one drop in each eye twice a day as needed for red itchy eyes. Avoid eye drops that say red eye relief as they may contain medications that dry out your eyes.    Call the clinic if this treatment plan is not working well for you.  Follow up in 2 months or sooner if needed.  No follow-ups on file.    Thank you for the opportunity to care for this patient.  Please do not hesitate to contact me with questions.  Thermon Leyland, FNP [facility]

## 2022-06-28 NOTE — Patient Instructions (Signed)
Allergic rhinitis Your environmental allergy skin testing was negative at today's visit with adequate controls. Continue montelukast 10 mg once a day Continue Xyzal 5 mg once a day Continue carbinoxamine 4 mg tablets.  Take 1 to 2 tablets at bedtime Continue Flonase 2 sprays in each nostril once a day as needed for stuffy nose Continue Atrovent 2 sprays in each nostril twice a day as needed for runny nose A lab order has been placed to help [place] evaluate your environmental allergies.  We will call you when the result becomes available.  Allergic conjunctivitis Some over the counter eye drops include Pataday one drop in each eye once a day as needed for red, itchy eyes OR Zaditor one drop in each eye twice a day as needed for red itchy eyes. Avoid eye drops that say red eye relief as they may contain medications that dry out your eyes.   Call the clinic if this treatment plan is not working well for you.  Follow up in 2 months or sooner if needed.

## 2022-06-29 NOTE — Addendum Note (Signed)
Addended by: Hetty Blend on: 06/29/2022 08:49 AM   Modules accepted: Orders

## 2022-07-01 NOTE — Patient Instructions (Signed)
SURGICAL WAITING ROOM VISITATION Patients having surgery or a procedure may have no more than 2 support people in the waiting area - these visitors may rotate in the visitor waiting room.   Due to an increase in RSV and influenza rates and associated hospitalizations, children ages 86 and under may not visit patients in [facility] hospitals. If the patient needs to stay at the hospital during part of their recovery, the visitor guidelines for inpatient rooms apply.  PRE-OP VISITATION  Pre-op nurse will coordinate an appropriate time for 1 support person to accompany the patient in pre-op.  This support person may not rotate.  This visitor will be contacted when the time is appropriate for the visitor to come back in the pre-op area.  Please refer to the [facility] website for the visitor guidelines for Inpatients (after your surgery is over and you are in a regular room).  You are not required to quarantine at this time prior to your surgery. However, you must do this: Hand Hygiene often Do NOT share personal items Notify your provider if you are in close contact with someone who has COVID or you develop fever 100.4 or greater, new onset of sneezing, cough, sore throat, shortness of breath or body aches.  If you test positive for Covid or have been in contact with anyone that has tested positive in the last 10 days please notify you surgeon.    Your procedure is scheduled on:  Friday  Jul 13, 2022  Report to [facility] Main Entrance: [address] entrance where the [employer] is available.   Report to admitting at: 09:15  AM  +++++Call this number if you have any questions or problems the morning of surgery [phone]  Do not eat food after Midnight the night prior to your surgery/procedure.  After Midnight you may have the following liquids until 08:45 AM DAY OF SURGERY  Clear Liquid Diet Water Black Coffee (sugar ok, NO MILK/CREAM OR CREAMERS)  Tea (sugar ok, NO  MILK/CREAM OR CREAMERS) regular and decaf                             Plain Jell-O  with no fruit (NO RED)                                           Fruit ices (not with fruit pulp, NO RED)                                     Popsicles (NO RED)                                                                  Juice: apple, WHITE grape, WHITE cranberry Sports drinks like Gatorade or Powerade (NO RED)                    The day of surgery:  Drink ONE (1) Pre-Surgery G2 at  08:45  AM the morning of surgery. Drink in one sitting. Do not sip.  This drink was given to you during your hospital pre-op appointment visit. Nothing else to drink after completing the Pre-Surgery  G2 : No candy, chewing gum or throat lozenges.    FOLLOW  ANY ADDITIONAL PRE OP INSTRUCTIONS YOU RECEIVED FROM YOUR SURGEON'S OFFICE!!!   Oral Hygiene is also important to reduce your risk of infection.        Remember - BRUSH YOUR TEETH THE MORNING OF SURGERY WITH YOUR REGULAR TOOTHPASTE  Do NOT smoke after Midnight the night before surgery.  Take ONLY these medicines the morning of surgery with A SIP OF WATER: levothyroxine (Synthroid), metoprolol, escitalopram (Lexapro)   You may not have any metal on your body including hair pins, jewelry, and body piercing  Do not wear make-up, lotions, powders, perfumes or deodorant  Do not wear nail polish including gel and S&S, artificial / acrylic nails, or any other type of covering on natural nails including finger and toenails. If you have artificial nails, gel coating, etc., that needs to be removed by a nail salon, Please have this removed prior to surgery. Not doing so may mean that your surgery could be cancelled or delayed if the Surgeon or anesthesia staff feels like they are unable to monitor you safely.   Do not shave 48 hours prior to surgery to avoid nicks in your skin which may contribute to postoperative infections.    You may bring a small overnight bag with  you on the day of surgery, only pack items that are not valuable. [facility] IS NOT RESPONSIBLE   FOR VALUABLES THAT ARE LOST OR STOLEN.   Do not bring your home medications to the hospital. The Pharmacy will dispense medications listed on your medication list to you during your admission in the Hospital.  Special Instructions: Bring a copy of your healthcare power of attorney and living will documents the day of surgery, if you wish to have them scanned into your  Medical Records- EPIC  Please read over the following fact sheets you were given: IF YOU HAVE QUESTIONS ABOUT YOUR PRE-OP INSTRUCTIONS, PLEASE CALL [phone].    +++++++ PLEASE FOLLOW THE ATTACHED INFORMATION REGARDING SHOWERING / BATHING SCHEDULE  PRIOR TO YOUR SURGERY. Start this schedule on :  Monday Jul 09, 2022    ON THE DAY OF SURGERY : Do not apply any lotions/deodorants the morning of surgery.  Please wear clean clothes to the hospital/surgery center.    FAILURE TO FOLLOW THESE INSTRUCTIONS MAY RESULT IN THE CANCELLATION OF YOUR SURGERY  PATIENT SIGNATURE_________________________________  NURSE SIGNATURE__________________________________  ________________________________________________________________________          Rogelia Mire    An incentive spirometer is a tool that can help keep your lungs clear and active. This tool measures how well you are filling your lungs with each breath. Taking long deep breaths may help reverse or decrease the chance of developing breathing (pulmonary) problems (especially infection) following: A long period of time when you are unable to move or be active. BEFORE THE PROCEDURE  If the spirometer includes an indicator to show your best effort, your nurse or respiratory therapist will set it to a desired goal. If possible, sit up straight or lean slightly forward. Try not to slouch. Hold the incentive spirometer in an upright position. INSTRUCTIONS  FOR USE  Sit on the edge of your bed if possible, or sit up as far as you can in bed or on a chair. Hold the incentive spirometer in an upright position. Breathe  out normally. Place the mouthpiece in your mouth and seal your lips tightly around it. Breathe in slowly and as deeply as possible, raising the piston or the ball toward the top of the column. Hold your breath for 3-5 seconds or for as long as possible. Allow the piston or ball to fall to the bottom of the column. Remove the mouthpiece from your mouth and breathe out normally. Rest for a few seconds and repeat Steps 1 through 7 at least 10 times every 1-2 hours when you are awake. Take your time and take a few normal breaths between deep breaths. The spirometer may include an indicator to show your best effort. Use the indicator as a goal to work toward during each repetition. After each set of 10 deep breaths, practice coughing to be sure your lungs are clear. If you have an incision (the cut made at the time of surgery), support your incision when coughing by placing a pillow or rolled up towels firmly against it. Once you are able to get out of bed, walk around indoors and cough well. You may stop using the incentive spirometer when instructed by your caregiver.  RISKS AND COMPLICATIONS Take your time so you do not get dizzy or light-headed. If you are in pain, you may need to take or ask for pain medication before doing incentive spirometry. It is harder to take a deep breath if you are having pain. AFTER USE Rest and breathe slowly and easily. It can be helpful to keep track of a log of your progress. Your caregiver can provide you with a simple table to help with this. If you are using the spirometer at home, follow these instructions: SEEK MEDICAL CARE IF:  You are having difficultly using the spirometer. You have trouble using the spirometer as often as instructed. Your pain medication is not giving enough relief while using  the spirometer. You develop fever of 100.5 F (38.1 C) or higher.                                                                                                    SEEK IMMEDIATE MEDICAL CARE IF:  You cough up bloody sputum that had not been present before. You develop fever of 102 F (38.9 C) or greater. You develop worsening pain at or near the incision site. MAKE SURE YOU:  Understand these instructions. Will watch your condition. Will get help right away if you are not doing well or get worse. Document Released: 06/25/2006 Document Revised: 05/07/2011 Document Reviewed: 08/26/2006 [facility] Patient Information 2014 [place], [place].

## 2022-07-01 NOTE — Progress Notes (Signed)
COVID Vaccine received:  []  No [x]  Yes Date of any COVID positive Test in last 90 days:  PCP - Sharlet Salina, MD Cardiologist - Armanda Magic, MD  Chest x-ray - 08-01-2021  2v  Epic EKG - 11-20-2021  Epic  Stress Test - 2015  Epic ECHO - 10-12-2021  Epic Cardiac Cath - none CTA calcium score: 0  in 04-15-2020  Epic  PCR screen: [x]  Ordered & Completed           []   No Order but Needs PROFEND           []   N/A for this surgery  Surgery Plan:  []  Ambulatory                            [x]  Outpatient in bed                            []  Admit  Anesthesia:    []  General  []  Spinal                           [x]   Choice []   MAC   Pacemaker / ICD device [x]  No []  Yes   Spinal Cord Stimulator:[x]  No []  Yes       History of Sleep Apnea? []  No [x]  Yes  mild CPAP used?- []  No []  Yes   ???  Does the patient monitor blood sugar?          []  No []  Yes  []  N/A  Patient has: []  NO Hx DM   [x]  Pre-DM                 []  DM1  []   DM2 Does patient have a [employer] or Dexacom? []  No []  Yes   Fasting Blood Sugar Ranges-  Checks Blood Sugar _____ times a day  Blood Thinner / Instructions: none  Aspirin Instructions: none  ERAS Protocol Ordered: []  No  [x]  Yes PRE-SURGERY [x]  ENSURE  []  G2  Patient is to be NPO after: 08:45  Patient was given the 5 CHG shower / bath instructions for THA surgery along with 2 bottles of the CHG soap. Patient will start this on: Monday 07-09-2022. Patient voiced understanding of this process.   Activity level: Patient is able / unable to climb a flight of stairs without difficulty; []  No CP  []  No SOB, but would have ___   Patient can / can not perform ADLs without assistance.   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  Patient verbalized understanding and agreement to the  Pre-Surgical Instructions that were given to them at this PAT appointment. Patient was also educated of the need to review these PAT instructions again prior to her surgery.I reviewed the appropriate phone numbers to call if they have any and questions or concerns.

## 2022-07-02 ENCOUNTER — Other Ambulatory Visit: Payer: Self-pay | Admitting: Internal Medicine

## 2022-07-02 ENCOUNTER — Other Ambulatory Visit (HOSPITAL_COMMUNITY): Payer: 59

## 2022-07-02 ENCOUNTER — Encounter (HOSPITAL_COMMUNITY): Payer: Self-pay

## 2022-07-02 ENCOUNTER — Telehealth (INDEPENDENT_AMBULATORY_CARE_PROVIDER_SITE_OTHER): Payer: 59 | Admitting: Neurology

## 2022-07-02 ENCOUNTER — Other Ambulatory Visit: Payer: Self-pay

## 2022-07-02 ENCOUNTER — Other Ambulatory Visit (HOSPITAL_COMMUNITY): Payer: Self-pay

## 2022-07-02 ENCOUNTER — Encounter (HOSPITAL_COMMUNITY)
Admission: RE | Admit: 2022-07-02 | Discharge: 2022-07-02 | Disposition: A | Discharge status: Home / Self Care | Payer: 59 | Source: Ambulatory Visit | Attending: Orthopaedic Surgery | Admitting: Orthopaedic Surgery

## 2022-07-02 ENCOUNTER — Telehealth: Payer: Self-pay | Admitting: Neurology

## 2022-07-02 ENCOUNTER — Encounter: Payer: Self-pay | Admitting: Neurology

## 2022-07-02 ENCOUNTER — Telehealth: Payer: 59 | Admitting: Neurology

## 2022-07-02 VITALS — BP 140/88 | HR 65 | Temp 98.6°F | Resp 20 | Ht 67.0 in | Wt 280.0 lb

## 2022-07-02 DIAGNOSIS — G4733 Obstructive sleep apnea (adult) (pediatric): Secondary | ICD-10-CM

## 2022-07-02 DIAGNOSIS — I129 Hypertensive chronic kidney disease with stage 1 through stage 4 chronic kidney disease, or unspecified chronic kidney disease: Secondary | ICD-10-CM | POA: Insufficient documentation

## 2022-07-02 DIAGNOSIS — G43709 Chronic migraine without aura, not intractable, without status migrainosus: Secondary | ICD-10-CM | POA: Diagnosis not present

## 2022-07-02 DIAGNOSIS — G43009 Migraine without aura, not intractable, without status migrainosus: Secondary | ICD-10-CM

## 2022-07-02 DIAGNOSIS — Z9884 Bariatric surgery status: Secondary | ICD-10-CM | POA: Diagnosis not present

## 2022-07-02 DIAGNOSIS — R7303 Prediabetes: Secondary | ICD-10-CM | POA: Insufficient documentation

## 2022-07-02 DIAGNOSIS — K219 Gastro-esophageal reflux disease without esophagitis: Secondary | ICD-10-CM | POA: Diagnosis not present

## 2022-07-02 DIAGNOSIS — N189 Chronic kidney disease, unspecified: Secondary | ICD-10-CM | POA: Diagnosis not present

## 2022-07-02 DIAGNOSIS — M1711 Unilateral primary osteoarthritis, right knee: Secondary | ICD-10-CM | POA: Insufficient documentation

## 2022-07-02 DIAGNOSIS — Z01812 Encounter for preprocedural laboratory examination: Secondary | ICD-10-CM | POA: Insufficient documentation

## 2022-07-02 DIAGNOSIS — Z01818 Encounter for other preprocedural examination: Secondary | ICD-10-CM

## 2022-07-02 HISTORY — DX: Chronic kidney disease, unspecified: N18.9

## 2022-07-02 HISTORY — DX: Essential (primary) hypertension: I10

## 2022-07-02 HISTORY — DX: Anxiety disorder, unspecified: F41.9

## 2022-07-02 HISTORY — DX: Cardiac arrhythmia, unspecified: I49.9

## 2022-07-02 LAB — ALLERGENS, ZONE 2

## 2022-07-02 LAB — COMPREHENSIVE METABOLIC PANEL
ALT: 28 U/L (ref 0–44)
AST: 19 U/L (ref 15–41)
Albumin: 4.1 g/dL (ref 3.5–5.0)
Alkaline Phosphatase: 52 U/L (ref 38–126)
Anion gap: 9 (ref 5–15)
BUN: 13 mg/dL (ref 8–23)
CO2: 27 mmol/L (ref 22–32)
Calcium: 8.6 mg/dL — ABNORMAL LOW (ref 8.9–10.3)
Chloride: 105 mmol/L (ref 98–111)
Creatinine, Ser: 1.08 mg/dL — ABNORMAL HIGH (ref 0.44–1.00)
GFR, Estimated: 58 mL/min — ABNORMAL LOW (ref >60)
Glucose, Bld: 106 mg/dL — ABNORMAL HIGH (ref 70–99)
Potassium: 4.2 mmol/L (ref 3.5–5.1)
Sodium: 141 mmol/L (ref 135–145)
Total Bilirubin: 0.4 mg/dL (ref 0.3–1.2)
Total Protein: 8 g/dL (ref 6.5–8.1)

## 2022-07-02 LAB — SURGICAL PCR SCREEN
MRSA, PCR: NEGATIVE
Staphylococcus aureus: NEGATIVE

## 2022-07-02 LAB — CBC
HCT: 37.6 % (ref 36.0–46.0)
Hemoglobin: 12.2 g/dL (ref 12.0–15.0)
MCH: 32.7 pg (ref 26.0–34.0)
MCHC: 32.4 g/dL (ref 30.0–36.0)
MCV: 100.8 fL — ABNORMAL HIGH (ref 80.0–100.0)
Platelets: 189 10*3/uL (ref 150–400)
RBC: 3.73 MIL/uL — ABNORMAL LOW (ref 3.87–5.11)
RDW: 14 % (ref 11.5–15.5)
WBC: 5.3 10*3/uL (ref 4.0–10.5)
nRBC: 0 % (ref 0.0–0.2)

## 2022-07-02 LAB — GLUCOSE, CAPILLARY: Glucose-Capillary: 109 mg/dL — ABNORMAL HIGH (ref 70–99)

## 2022-07-02 MED ORDER — TIZANIDINE HCL 4 MG PO CAPS
4.0000 mg | ORAL_CAPSULE | Freq: Three times a day (TID) | ORAL | 1 refills | Status: DC | PRN
  Filled 2022-07-02: qty 90, 30d supply, fill #0
  Filled 2022-07-26: qty 90, 30d supply, fill #1

## 2022-07-02 MED ORDER — UBRELVY 100 MG PO TABS
100.0000 mg | ORAL_TABLET | ORAL | 11 refills | Status: DC | PRN
Start: 2022-07-02 — End: 2023-07-04
  Filled 2022-07-02: qty 16, 2d supply, fill #0

## 2022-07-02 MED ORDER — EMGALITY 120 MG/ML ~~LOC~~ SOAJ
120.0000 mg | SUBCUTANEOUS | 11 refills | Status: DC
Start: 2022-07-02 — End: 2023-03-26
  Filled 2022-07-02: qty 2, 60d supply, fill #0
  Filled 2022-07-02 – 2022-07-06 (×2): qty 1, 30d supply, fill #0
  Filled 2022-08-06: qty 1, 30d supply, fill #1
  Filled 2022-09-03: qty 1, 30d supply, fill #2
  Filled 2022-10-02: qty 1, 30d supply, fill #3
  Filled 2022-11-01: qty 1, 30d supply, fill #4
  Filled 2022-11-30: qty 1, 30d supply, fill #5

## 2022-07-02 NOTE — Progress Notes (Signed)
[facility]    Provider:  Dr Lucia Gaskins Requesting Provider: Lenord Fellers Luanna Cole, MD Primary Care Provider:  Margaree Mackintosh, MD  CC:  migraines, dizziness  Virtual Visit via Video Note  I connected with Dyane Dustman on 07/02/2022 at  7:30 AM EDT by a video enabled telemedicine application and verified that I am speaking with the correct person using two identifiers.  Location: Patient: home Provider: office   I discussed the limitations of evaluation and management by telemedicine and the availability of in person appointments. The patient expressed understanding and agreed to proceed.  Follow Up Instructions:    I discussed the assessment and treatment plan with the patient. The patient was provided an opportunity to ask questions and all were answered. The patient agreed with the plan and demonstrated an understanding of the instructions.   The patient was advised to call back or seek an in-person evaluation if the symptoms worsen or if the condition fails to improve as anticipated.  I provided ver 30 minutes of non-face-to-face time during this encounter.   Anson Fret, MD   07/02/2022: She was placed on Emgality but having problems getting it finally it was cleared up just needed a new copay card and last injection last month, waiting on this month, she has had it every mont and she feels like since January she has been doing well. Migraines are at least >>50% improvement in frequency and severity feels like she is doing very well. Bernita Raisin helps when she does have it. Prior to emgality having at least 12-15 migraines a month and most of the month of headaches. And now 4 migraines a month with the emgality and Less than 6 total headache days a month and ubrelvy works.   Patient complains of symptoms per HPI as well as the following symptoms: migraines . Pertinent negatives and positives per HPI. All others negative   March 28, 2022: Patient is here  for follow-up of migraines, at last appointment we started her on Qulipta which was approved. Qulipta upset her stomach couldn't tolerate it. Ubrelvy not working either she has to take 2. Will try nasal spray next zavapret if ubrelvy not working in combination with emgality. She has mild sleep apnea and wearing cpap. Start emgality first month 2 injections every month thereafter is one injection. She is having >8 migraine days a month and > 15 headache days a month for last 4 months.   Tried: gabapentin, labetalol, nurtec, Ajovy, qulipta, ubrelvy, metoprolol, imitrex, maxalt, amitriptyline, topamax  Patient complains of symptoms per HPI as well as the following symptoms: migraines . Pertinent negatives and positives per HPI. All others negative   12/26/2021: Patient with severe low back pain and radicular symptoms with concerning symptoms, weakness of the lower extremities, absent reflexes, MRI lumbar spine was essential normal as was the emg/ncs. . We have seen her in the past for migraines and also numbness/tingling in the feet. EMG/NCS normal 05/2020 of upper and lower. Today she is here for refill on her nurtec. Dizzy especially when walking. She has migraines about 6 migraine days a month and 10 total headache days a month. Nurtec doesn't help as much as it used to. She has been etting lightheaded with exercise more lightheadedness. Feeling like she may pass out. She doesn't drink enough fluids in a day. She has not been feeling well, more stress at work, not drinking enough fluids.   Tried: gabapentin, labetalol, nurtec, Ajovy, qulipta, ubrelvy, metoprolol, imitrex, maxalt,  amitriptyline, topamax  Patient complains of symptoms per HPI as well as the following symptoms: dizziness . Pertinent negatives and positives per HPI. All others negative   HPI 06/04/2019:  Kennadi Recla is a 62 y.o. female here as requested by Margaree Mackintosh, MD for numbness in the feet. PMHx migraines, chiari  malformation, vitamin B12 and vitamin D deficiency, post gastrectomy syndrome, pernicious anemia, osteoarthritis, hypothyroidism, C. difficile, palpitations, headache, degenerative disease of the low back, chronic leukopenia.  She has back pain and it radiates down the left leg. She has had injections in her back, she has been under the care of him for over a year, she has tried all sorts of conservative measures, se has been to PT. She had a hip revision and knee problems but this is not coming from hip or knee and xray of the knee is fine, Starts in the low back raves behind the leg and down to the toes on the left. Now the right is giving her trouble and radiating to her buttocks and down the leg. She is having weakness in the legs. She can't exercise. She is having urinary changes,.pain is severe, shooting are severe, waking her up at night, she is trying analgesics, muscle relaxants, gabapentin. No other focal neurologic deficits, associated symptoms, inciting events or modifiable factors.  Reviewed notes, labs and imaging from outside physicians, which showed:  Personally reviewed images MRI cervical spine and agree with the following 2020: MRI cervical spine (without) demonstrating: - Mild degenerative changes from C3-4 down to C6-7. - No spinal stenosis or foraminal narrowing  MRI brain was unremarkable  B12 398 B1 9 Folate nml  Reviewed MRI report 03/06/2006 L1-2:  Mild facet overgrowth.  No central or foraminal stenosis. L2-3:  Mild bilateral facet overgrowth.  No significant stenosis.   L3-4:  Minimal facet overgrowth. L4-5:  Mild facet overgrowth is present.  A small synovial cyst is present posterior to the left facet.  This is not in a position to cause nerve impingement.   L5-S1:  Left excentric disc bulge noted.  Facet overgrowth noted.  There is mild left foraminal stenosis. IMPRESSION:   1.  Mild left foraminal stenosis at L5-S1, predominantly due to facet overgrowth as shown  on image # 10 of series 4.   Review of Systems: Patient complains of symptoms per HPI as well as the following symptoms: severe leg pain, low back pain, weakness. Pertinent negatives and positives per HPI. All others negative.   Social History   Socioeconomic History   Marital status: Married    Spouse name: Thereasa Distance   Number of children: 2   Years of education: Not on file   Highest education level: Not on file  Occupational History   Occupation: Charity fundraiser - Adult nurse GI    Employer: [employer]  Tobacco Use   Smoking status: Never   Smokeless tobacco: Never  Vaping Use   Vaping Use: Never used  Substance and Sexual Activity   Alcohol use: No   Drug use: No   Sexual activity: Not Currently    Partners: Male    Birth control/protection: Surgical    Comment: hysterectomy  Other Topics Concern   Not on file  Social History Narrative   Lives at home with spouse   Right handed   Caffeine: diet zero mtn dew, occasionally    Social Determinants of Health   Financial Resource Strain: Not on file  Food Insecurity: Not on file  Transportation Needs: Not  on file  Physical Activity: Not on file  Stress: Not on file  Social Connections: Not on file  Intimate Partner Violence: Not on file    Family History  Problem Relation Age of Onset   High blood pressure Mother    Hypertension Mother    Headache Mother    High Cholesterol Mother    Thyroid disease Mother    Obesity Mother    Kidney disease Mother    Hypertension Father    Diabetes Father    High blood pressure Father    High Cholesterol Father    Heart disease Father    Obesity Father    Diabetes Sister    Hypertension Sister    Thyroid disease Brother    Thyroid disease Maternal Aunt    Migraines Neg Hx     Past Medical History:  Diagnosis Date   Back pain    Bursitis    right shoulder   Chronic leukopenia    intermittant since 2010, followed by Dr. Lenord Fellers now   Chronic neck pain    Constipation     Degenerative joint disease of low back 09/02/2015   Dr. Despina Hick   Displacement of lumbar intervertebral disc    Edema of both lower legs    Fibrocystic breast    Gallbladder problem    GERD (gastroesophageal reflux disease)    Headache    per pt more stress/tension   Heart palpitations    SEE EPIC ENCOUTNER , CARDIOLOGY DR. Gloris Manchester TURNER 2018; reports on 05-16-17 " i haven't had any bouts of those lately"     Hemorrhoids    Hidradenitis suppurativa    History of Clostridium difficile infection 12/2010   History of exercise intolerance    ETT on 04-27-2016-- negative ([facility] treadmill score 7)   History of Helicobacter pylori infection 2001 and 09/ 2012   HSV-2 infection    genital   Hx of adenomatous colonic polyps 10/17/2007   Hydradenitis    per pt currently treated with Humira injection   Hyperthyroidism    Hypocupremia    Hypothyroidism    Hypothyroidism, postsurgical 1980   IBS (irritable bowel syndrome)    Insomnia    Iron deficiency    Joint pain    Leukopenia    Lumbosacral radiculopathy    Medial meniscus tear    right knee   Mild obstructive sleep apnea    Mitral regurgitation    Mild to moderate by echo 09/2021   Numbness and tingling of right arm    Obesity    Osteoarthritis    Palpitations    Pernicious anemia    b12 def   Peroneal neuropathy    PONV (postoperative nausea and vomiting)    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   Prediabetes    Reactive hypoglycemia followed by pcp   post gastrectomy dumping syndrome   SOB (shortness of breath)    Spinal stenosis    Tendonitis    right shoulder   Varicose veins    Vertigo    Vitamin B 12 deficiency    Vitamin D deficiency     Patient Active Problem List   Diagnosis Date Noted   Migraine without aura and without status migrainosus, not intractable 07/02/2022   Chronic conjunctivitis of both eyes 06/28/2022   Generalized abdominal pain 05/24/2022   Chronic rhinitis 05/09/2022   Chronic migraine  without aura without status migrainosus, not intractable 12/26/2021   Depressed mood 12/19/2021   Obstructive sleep  apnea 12/19/2021   Other hypoglycemia-post bariatric 12/05/2021   Secondary hyperparathyroidism (HCC) 12/05/2021   Inadequate fluid intake 12/05/2021   Inadequate sleep hygiene 12/05/2021   Class 3 severe obesity with body mass index (BMI) of 40.0 to 44.9 in adult [facility]) 12/05/2021   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   SOBOE (shortness of breath on exertion) 11/21/2021   Other fatigue 11/21/2021   Vitamin D deficiency 11/21/2021   Iron deficiency 11/21/2021   Hypoglycemia after bariatric surgery 11/21/2021   Depression screening 11/21/2021   Class 3 severe obesity with serious comorbidity and body mass index (BMI) of 40.0 to 44.9 in adult (HCC) 11/21/2021   Mild obstructive sleep apnea 11/17/2021   Vertigo 11/17/2021   Excessive daytime sleepiness 10/23/2021   Spinal stenosis of lumbar region 07/07/2020   Unilateral primary osteoarthritis, right knee 06/08/2020   Displacement of lumbar intervertebral disc 06/02/2020   Lumbar spondylosis 06/02/2020   Lumbar stenosis with neurogenic claudication 06/02/2020   Arm numbness 05/30/2020   Chronic neck pain 05/30/2020   Other chronic pain 05/30/2020   Elevated blood-pressure reading, without diagnosis of hypertension 05/30/2020   Status post arthroscopy of right knee 12/11/2019   Polyethylene wear of left knee joint prosthesis (HCC) 09/09/2019   Acute medial meniscal tear, right, subsequent encounter 08/27/2019   History of total left knee replacement 07/16/2019   Peroneal neuropathy at knee, left 06/25/2019   Lumbosacral radiculopathy 06/07/2019   Iron malabsorption 10/16/2018   IDA (iron deficiency anemia) 10/16/2018   OA (osteoarthritis) of knee 01/20/2018   Acute meniscal tear, medial 07/17/2017   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   Failed total hip arthroplasty, 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/2017   Pain in  left knee 04/11/2017   Insomnia 11/08/2016   Pernicious anemia 11/08/2016   Leukopenia 07/09/2016   Hypocupremia 07/09/2016   Palpitations 04/12/2016   Shortness of breath 04/12/2016   Reactive hypoglycemia 10/31/2015   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/31/2015   Lap gastric bypass May 2016 07/06/2014   History of Clostridium difficile infection 08/12/2011   Post-surgical hypothyroidism 08/26/2010   Gastroesophageal reflux disease 08/26/2010   Vitamin D deficiency 08/26/2010   Fibrocystic disease of breast 08/26/2010   Cobalamin deficiency 08/26/2010   Morbid obesity (HCC) 03/20/2010   Backache 03/20/2010    Past Surgical History:  Procedure Laterality Date   BREATH TEK H PYLORI N/A 05/03/2014   Procedure: BREATH TEK H PYLORI;  Surgeon: Wenda Low, MD;  Location: [facility];  Service: General;  Laterality: N/A;   CARDIOVASCULAR STRESS TEST  03/11/2013   dr Gloris Manchester turner   Low risk nuclear study w/ a very small anterior perfusion defect that most likely represents breast attenuation artifact, less likely this could represent a true perfusion abnormality in the distribution of diagonal branch of the LAD/  normal LV function and wall motion , ef 66%   CATARACT EXTRACTION W/ INTRAOCULAR LENS  IMPLANT, BILATERAL  10/2017   CESAREAN SECTION  1985   CHOLECYSTECTOMY OPEN  1987   COLONOSCOPY  02/2017   Dr Loreta Ave ; per patient they found 7 polyps with polypectomy; on 5 year track    ESOPHAGOGASTRODUODENOSCOPY  02/01/2021   FINE NEEDLE ASPIRATION Left 05/22/2017   Procedure: LEFT KNEE ASPIRATION;  Surgeon: Ollen Gross, MD;  Location: [facility];  Service: Orthopedics;  Laterality: Left;   GASTRIC ROUX-EN-Y N/A 07/06/2014   Procedure: LAPAROSCOPIC ROUX-EN-Y GASTRIC BYPASS, ENTEROLYSIS OF ADHESIONS WITH UPPER ENDOSCOPY;  Surgeon: Luretha Murphy, MD;  Location: [facility];  Service: General;  Laterality: N/A;   HAMMER TOE SURGERY Bilateral 02/17/2008   bilateral foot digit's 2 and 5   HIATAL HERNIA  REPAIR N/A 07/06/2014   Procedure: LAPAROSCOPIC REPAIR OF HIATAL HERNIA;  Surgeon: Luretha Murphy, MD;  Location: [facility];  Service: General;  Laterality: N/A;   I & D KNEE WITH POLY EXCHANGE Left 12/11/2019   Procedure: LEFT KNEE POLY-LINER EXCHANGE;  Surgeon: Kathryne Hitch, MD;  Location: [facility];  Service: Orthopedics;  Laterality: Left;   KNEE ARTHROSCOPY Right 12/11/2019   Procedure: RIGHT KNEE ARTHROSCOPY WITH PARTIAL MEDIAL MENISCECTOMY;  Surgeon: Kathryne Hitch, MD;  Location: [facility];  Service: Orthopedics;  Laterality: Right;   KNEE ARTHROSCOPY WITH MEDIAL MENISECTOMY Left 07/17/2017   Procedure: LEFT KNEE ARTHROSCOPY WITH MEDIAL LATERAL MENISECTOMY;  Surgeon: Ollen Gross, MD;  Location: [facility];  Service: Orthopedics;  Laterality: Left;   MASS EXCISION N/A 10/11/2016   Procedure: EXCISION OF PERIANAL MASS;  Surgeon: Romie Levee, MD;  Location: [facility];  Service: General;  Laterality: N/A;   REFRACTIVE SURGERY     THYROIDECTOMY  1980   TOTAL ABDOMINAL HYSTERECTOMY  08-25-2002   dr Myrlene Broker   ovaries retained   TOTAL HIP ARTHROPLASTY Right 05/21/2012   Procedure: TOTAL HIP ARTHROPLASTY;  Surgeon: Loanne Drilling, MD;  Location: [facility];  Service: Orthopedics;  Laterality: Right;   TOTAL HIP ARTHROPLASTY Left 01-31-2009  dr Lequita Halt   TOTAL HIP REVISION Left 05/22/2017   Procedure: Left hip bearing surface and head revision;  Surgeon: Ollen Gross, MD;  Location: [facility];  Service: Orthopedics;  Laterality: Left;   TOTAL KNEE ARTHROPLASTY Left 01/20/2018   Procedure: LEFT TOTAL KNEE ARTHROPLASTY;  Surgeon: Ollen Gross, MD;  Location: [facility];  Service: Orthopedics;  Laterality: Left;    TRANSTHORACIC ECHOCARDIOGRAM  05/03/2016   ef 55-60%/  trivial MR/  mild TR   TUBAL LIGATION      Current Outpatient Medications  Medication Sig Dispense Refill   Adalimumab (HUMIRA, 2 PEN,) 40 MG/0.8ML PNKT Inject 1 pen subcutaneously every week  (Patient taking differently: once a week.) 4 each 5   albuterol (VENTOLIN HFA) 108 (90 Base) MCG/ACT inhaler Inhale 2 puffs into the lungs every 4 hours as needed for wheezing or shortness of breath. 18 g 3   budesonide (ENTOCORT EC) 3 MG 24 hr capsule Take 3 capsules (9 mg total) by mouth in the morning. 270 capsule 3   Carbinoxamine Maleate 4 MG TABS Take 1-2 tablets at night as needed for drainage and allergies. May cause drowsiness. 28 tablet 5   celecoxib (CELEBREX) 200 MG capsule Take 1 capsule (200 mg total) by mouth 2 (two) times daily as needed. (Patient taking differently: Take 200 mg by mouth daily as needed (Arthritis pain).) 60 capsule 3   clobetasol ointment (TEMOVATE) 0.05 % Apply to skin twice daily as directed for up to 2 weeks as needed (Patient taking differently: Apply 1 Application topically 2 (two) times daily as needed (Rash).) 60 g 0   cyanocobalamin (DODEX) 1000 MCG/ML injection INJECT 1 ML INTRAMUSCULARLY EVERY MONTH 3 mL 11   ergocalciferol (VITAMIN D2) 1.25 MG (50000 UT) capsule Take 1 capsule (50,000 Units total) by mouth 2 (two) times a week for Vitamin deficiency and gastric bypass. 8 capsule 1   escitalopram (LEXAPRO) 10 MG tablet Take 1 tablet (10 mg total) by mouth daily. 90 tablet 1   Estradiol 0.75 MG/1.25 GM (0.06%) topical gel Place 1.25 g onto the skin  daily. (Patient taking differently: Place 1.25 g onto the skin every other day.) 150 g 0   flecainide (TAMBOCOR) 50 MG tablet Take 1 tablet (50 mg total) by mouth daily at 6 (six) AM. 90 tablet 3   gabapentin (NEURONTIN) 600 MG tablet Take 1 tablet (600 mg total) by mouth 3 (three) times daily. (Patient taking differently: Take 600 mg by mouth at bedtime. Additional 600 mg if needed for pain) 90 tablet 5   Galcanezumab-gnlm (EMGALITY) 120 MG/ML SOAJ Inject 120 mg into the skin every 30 (thirty) days. 1.12 mL 11   ipratropium (ATROVENT) 0.03 % nasal spray Use 1-2 sprays in each nostril up to 3 times daily as needed  for runny nose. Can take 15 minutes prior to meals. 30 mL 3   levocetirizine (XYZAL) 5 MG tablet Take 1 tablet (5 mg total) by mouth every evening. 30 tablet 5   levothyroxine (SYNTHROID) 112 MCG tablet Take 1 tablet (112 mcg total) by mouth daily. 90 tablet 1   LORazepam (ATIVAN) 1 MG tablet Take 1 tablet (1 mg total) by mouth every 8 (eight) hours. (Patient taking differently: Take 1 mg by mouth daily as needed for anxiety.) 90 tablet 1   LORazepam (ATIVAN) 2 MG tablet Take 2 mg by mouth at bedtime.     metoprolol succinate (TOPROL-XL) 25 MG 24 hr tablet Take 1 tablet (25 mg total) by mouth daily. 90 tablet 3   montelukast (SINGULAIR) 10 MG tablet Take 1 tablet (10 mg total) by mouth at bedtime. 30 tablet 3   Olopatadine HCl 0.2 % SOLN Apply 1 drop to eye daily as needed. (Patient taking differently: Apply 1 drop to eye daily as needed (allergies).) 2.5 mL 5   pantoprazole (PROTONIX) 40 MG tablet Take 1 tablet by mouth daily 20 minutes before breakfast (Patient taking differently: Take 40 mg by mouth daily as needed (Heartburn).) 90 tablet 3   promethazine (PHENERGAN) 25 MG tablet Take 1 tablet (25 mg total) by mouth every 8 (eight) hours as needed for N/V. 30 tablet 5   sulfamethoxazole-trimethoprim (BACTRIM DS) 800-160 MG tablet Take 1 tablet by mouth 2 (two) times daily as needed for flare (Patient taking differently: Take 1 tablet by mouth as needed (Breakthrough Flare-up).) 60 tablet 1   terconazole (TERAZOL 7) 0.4 % vaginal cream Place 1 applicatorful vaginally at bedtime for seven nights.   Then use one applicatorful once a week for 6 months. 45 g 4   tiZANidine (ZANAFLEX) 4 MG capsule Take 1 capsule (4 mg total) by mouth 3 (three) times daily as needed for muscle spasms. 90 capsule 1   triamcinolone cream (KENALOG) 0.1 % Apply 1 Application topically 2 (two) times daily. Use for 2 weeks, and then off for 2 weeks as needed. (Patient not taking: Reported on 06/28/2022) 80 g 1   Ubrogepant  (UBRELVY) 100 MG TABS Take 1 tablet (100 mg total) by mouth every 2 (two) hours as needed. Maximum 200mg  a day. 16 tablet 11   No current facility-administered medications for this visit.   Facility-Administered Medications Ordered in Other Visits  Medication Dose Route Frequency Provider Last Rate Last Admin   gadobenate dimeglumine (MULTIHANCE) injection 20 mL  20 mL Intravenous Once PRN Anson Fret, MD        Allergies as of 07/02/2022 - Review Complete 06/28/2022  Allergen Reaction Noted   Other Other (See Comments) 02/11/2018   Estradiol Rash and Other (See Comments) 03/12/2016    Vitals: There were  no vitals taken for this visit. Last Weight:  Wt Readings from Last 1 Encounters:  05/31/22 273 lb (123.8 kg)   Last Height:   Ht Readings from Last 1 Encounters:  05/31/22 5\' 7"  (1.702 m)   Physical exam: Exam: Gen: NAD, conversant      CV: Could not perform over Web Video. Denies palpitations or chest pain or SOB. VS: Breathing at a normal rate.. Not febrile. Eyes: Conjunctivae clear without exudates or hemorrhage  Neuro: Detailed Neurologic Exam  Speech:    Speech is normal; fluent and spontaneous with normal comprehension.  Cognition:    The patient is oriented to person, place, and time;     recent and remote memory intact;     language fluent;     normal attention, concentration,     fund of knowledge Cranial Nerves:    The pupils are equal, round, and reactive to light. Cannot perform fundoscopic exam. Visual fields are full to finger confrontation. Extraocular movements are intact.  The face is symmetric with normal sensation. The palate elevates in the midline. Hearing intact. Voice is normal. Shoulder shrug is normal. The tongue has normal motion without fasciculations.   Coordination:    Normal finger to nose  Gait:    Normal native gait  Motor Observation:   no involuntary movements noted. Tone:    Appears normal  Posture:    Posture is  normal. normal erect    Strength:    Strength is anti-gravity and symmetric in the upper and lower limbs.      Sensation: intact to LT      Assessment/Plan:  Patient with migraines and dizziness;  Chronic migraines: She was placed on Emgality Migraines are at least >>70% improvement in frequency and severity feels like she is doing very well. Prior to emgality having at least 12-15 migraines a month and most of the month of headaches. Continue emgality.   Migraines:  And now has 4 migraines a month with the emgality and Less than 6 total headache days a month and ubrelvy works. Continue ubrelvy.  She has mild sleep apnea and wearing cpap.   Tried: gabapentin, labetalol, nurtec, Ajovy, qulipta, ubrelvy, metoprolol, imitrex, maxalt, amitriptyline, topamax, qulipta, unrelvy, emgality   Cc: Baxley, Luanna Cole, MD,    Naomie Dean, MD  [facility] [address] [place], [place] [address]  Phone [phone] Fax [phone]

## 2022-07-02 NOTE — Progress Notes (Signed)
Can you please let this patient know that her environmental allergy testing was negative. She should continue to follow the treatment plan from her last visit. Thank you

## 2022-07-02 NOTE — Telephone Encounter (Signed)
Sent mychart msg informing pt of follow up made with Amy

## 2022-07-02 NOTE — Telephone Encounter (Signed)
Please schedule one year follow up with NP please, she can see an NP she is stable and tis is for med refill. NP please migraine follow up and med refill can be virtual if she like thanks

## 2022-07-03 ENCOUNTER — Other Ambulatory Visit (HOSPITAL_COMMUNITY): Payer: Self-pay

## 2022-07-03 ENCOUNTER — Other Ambulatory Visit: Payer: Self-pay

## 2022-07-04 NOTE — Anesthesia Preprocedure Evaluation (Addendum)
Anesthesia Evaluation  Patient identified by MRN, date of birth, ID band Patient awake    Reviewed: Allergy & Precautions, NPO status , Patient's Chart, lab work & pertinent test results  History of Anesthesia Complications (+) PONV and history of anesthetic complications  Airway Mallampati: III  TM Distance: >3 FB Neck ROM: Full    Dental  (+) Dental Advisory Given   Pulmonary sleep apnea    breath sounds clear to auscultation       Cardiovascular hypertension, Pt. on medications and Pt. on home beta blockers + dysrhythmias  Rhythm:Regular Rate:Normal     Neuro/Psych  Headaches  Neuromuscular disease    GI/Hepatic Neg liver ROS,GERD  ,,  Endo/Other  Hypothyroidism  Morbid obesity  Renal/GU CRFRenal disease     Musculoskeletal  (+) Arthritis ,    Abdominal   Peds  Hematology negative hematology ROS (+)   Anesthesia Other Findings   Reproductive/Obstetrics                             Lab Results  Component Value Date   WBC 5.3 07/02/2022   HGB 12.2 07/02/2022   HCT 37.6 07/02/2022   MCV 100.8 (H) 07/02/2022   PLT 189 07/02/2022   Lab Results  Component Value Date   CREATININE 1.23 (H) 07/09/2022   BUN 13 07/02/2022   NA 141 07/02/2022   K 4.2 07/02/2022   CL 105 07/02/2022   CO2 27 07/02/2022    Anesthesia Physical Anesthesia Plan  ASA: 3  Anesthesia Plan: General   Post-op Pain Management: Regional block*, Tylenol PO (pre-op)* and Ketamine IV*   Induction: Intravenous  PONV Risk Score and Plan: 4 or greater and Midazolam, Scopolamine patch - Pre-op, Dexamethasone, Ondansetron and Propofol infusion  Airway Management Planned: Oral ETT and LMA  Additional Equipment:   Intra-op Plan:   Post-operative Plan: Extubation in OR  Informed Consent: I have reviewed the patients History and Physical, chart, labs and discussed the procedure including the risks, benefits  and alternatives for the proposed anesthesia with the patient or authorized representative who has indicated his/her understanding and acceptance.     Dental advisory given  Plan Discussed with: CRNA  Anesthesia Plan Comments: (Pt declines SAB. )       Anesthesia Quick Evaluation

## 2022-07-04 NOTE — Progress Notes (Signed)
Anesthesia chart review   Case: [id number] Date/Time: 07/13/22 1130   Procedure: RIGHTTOTAL KNEE ARTHROPLASTY (Right: Knee)   Anesthesia type: Choice   Pre-op diagnosis: OSTEOARTHRITIS / DEGENERATIVE JOINT DISEASE RIGHT KNEE   Location: [facility]OM 10 / [facility]   Surgeons: Kathryne Hitch, MD       DISCUSSION: 62 year old never smoker with history of PONV, HTN, GERD, sleep apnea with CPAP, PVCs, CKD, gastric bypass 2016, right knee DJD scheduled for above procedure 07/13/2022 with Dr. Doneen Poisson.  Follows with neurology for migraines.  Last seen 07/02/2022.  Patient last seen by cardiology 05/31/2022.  Per office visit note, "-According to the Revised Cardiac Risk Index (RCRI), her Perioperative Risk of Major Cardiac Event is (%): 0.4 which is low and ok to proceed with surgery   Her Functional Capacity in METs is: 5.07 according to the [facility] Activity Status Index (DASI).Therefore no further ischemic workup needed."  Anticipate pt can proceed with planned procedure barring acute status change.   VS: BP (!) 140/88 Comment: right arm sitting  Pulse 65   Temp 37 C (Oral)   Resp 20   Ht 5\' 7"  (1.702 m)   Wt 127 kg   SpO2 98%   BMI 43.85 kg/m   PROVIDERS: Margaree Mackintosh, MD is PCP  Armanda Magic, MD is cardiologist LABS: Labs reviewed: Acceptable for surgery. (all labs ordered are listed, but only abnormal results are displayed)  Labs Reviewed  CBC - Abnormal; Notable for the following components:      Result Value   RBC 3.73 (*)    MCV 100.8 (*)    All other components within normal limits  COMPREHENSIVE METABOLIC PANEL - Abnormal; Notable for the following components:   Glucose, Bld 106 (*)    Creatinine, Ser 1.08 (*)    Calcium 8.6 (*)    GFR, Estimated 58 (*)    All other components within normal limits  GLUCOSE, CAPILLARY - Abnormal; Notable for the following components:   Glucose-Capillary 109 (*)    All other components within normal limits  SURGICAL PCR  SCREEN     IMAGES:   EKG:   CV: Echo 10/12/2021 1. Left ventricular ejection fraction by 3D volume is 58 %. The left  ventricle has normal function. The left ventricle has no regional wall  motion abnormalities. There is mild asymmetric left ventricular  hypertrophy of the septal segment. Left  ventricular diastolic parameters were normal.   2. Right ventricular systolic function is normal. The right ventricular  size is normal.   3. The mitral valve is normal in structure. Mild to moderate mitral valve  regurgitation. No evidence of mitral stenosis.   4. The aortic valve is normal in structure. Aortic valve regurgitation is  not visualized. No aortic stenosis is present.   5. The inferior vena cava is normal in size with greater than 50%  respiratory variability, suggesting right atrial pressure of 3 mmHg.   CT coronary 04/15/2020 IMPRESSION: 1. Coronary calcium score of 0. This was 0 percentile for age and sex matched control.   2. Normal coronary origin with right dominance.   3. No evidence of CAD.   4. CAD-RADS 0. No evidence of CAD (0%). Consider non-atherosclerotic causes of chest pain. Past Medical History:  Diagnosis Date   Anxiety    Back pain    Bursitis    right shoulder   Chronic kidney disease    hematuria, has been worked up, her normal   Chronic  leukopenia    intermittant since 2010, followed by Dr. Lenord Fellers now   Chronic neck pain    Constipation    Degenerative joint disease of low back 09/02/2015   Dr. Despina Hick   Displacement of lumbar intervertebral disc    Dysrhythmia    Tachycardia, PVCs, PACs   Edema of both lower legs    Fibrocystic breast    Gallbladder problem    GERD (gastroesophageal reflux disease)    Headache    per pt more stress/tension   Heart palpitations    SEE EPIC ENCOUTNER , CARDIOLOGY DR. Gloris Manchester TURNER 2018; reports on 05-16-17 " i haven't had any bouts of those lately"     Hemorrhoids    Hidradenitis suppurativa     History of Clostridium difficile infection 12/2010   History of exercise intolerance    ETT on 04-27-2016-- negative (Duke treadmill score 7)   History of Helicobacter pylori infection 2001 and 09/ 2012   HSV-2 infection    genital   Hx of adenomatous colonic polyps 10/17/2007   Hydradenitis    per pt currently treated with Humira injection   Hypertension    Hyperthyroidism    Hypocupremia    Hypothyroidism    Hypothyroidism, postsurgical 1980   IBS (irritable bowel syndrome)    Insomnia    Iron deficiency    Joint pain    Leukopenia    Lumbosacral radiculopathy    Lymphocytic colitis 05/14/2022   dx from colonoscopy   Medial meniscus tear    right knee   Mild obstructive sleep apnea    Mitral regurgitation    Mild to moderate by echo 09/2021   Numbness and tingling of right arm    Obesity    Osteoarthritis    Palpitations    Pernicious anemia    b12 def   Peroneal neuropathy    PONV (postoperative nausea and vomiting)    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   Prediabetes    Reactive hypoglycemia followed by pcp   post gastrectomy dumping syndrome   SOB (shortness of breath)    Spinal stenosis    Tendonitis    right shoulder   Varicose veins    Vertigo    Vitamin B 12 deficiency    Vitamin D deficiency     Past Surgical History:  Procedure Laterality Date   BREATH TEK H PYLORI N/A 05/03/2014   Procedure: BREATH TEK H PYLORI;  Surgeon: Wenda Low, MD;  Location: [facility];  Service: General;  Laterality: N/A;   CARDIOVASCULAR STRESS TEST  03/11/2013   dr Gloris Manchester turner   Low risk nuclear study w/ a very small anterior perfusion defect that most likely represents breast attenuation artifact, less likely this could represent a true perfusion abnormality in the distribution of diagonal branch of the LAD/  normal LV function and wall motion , ef 66%   CATARACT EXTRACTION W/ INTRAOCULAR LENS  IMPLANT, BILATERAL  10/2017   CESAREAN SECTION  1985    CHOLECYSTECTOMY OPEN  1987   COLONOSCOPY  02/2017   Dr Loreta Ave ; per patient they found 7 polyps with polypectomy; on 5 year track    ESOPHAGOGASTRODUODENOSCOPY  02/01/2021   FINE NEEDLE ASPIRATION Left 05/22/2017   Procedure: LEFT KNEE ASPIRATION;  Surgeon: Ollen Gross, MD;  Location: [facility];  Service: Orthopedics;  Laterality: Left;   GASTRIC ROUX-EN-Y N/A 07/06/2014   Procedure: LAPAROSCOPIC ROUX-EN-Y GASTRIC BYPASS, ENTEROLYSIS OF ADHESIONS WITH UPPER ENDOSCOPY;  Surgeon: Luretha Murphy, MD;  Location:  [facility];  Service: General;  Laterality: N/A;   HAMMER TOE SURGERY Bilateral 02/17/2008   bilateral foot digit's 2 and 5   HIATAL HERNIA REPAIR N/A 07/06/2014   Procedure: LAPAROSCOPIC REPAIR OF HIATAL HERNIA;  Surgeon: Luretha Murphy, MD;  Location: [facility];  Service: General;  Laterality: N/A;   I & D KNEE WITH POLY EXCHANGE Left 12/11/2019   Procedure: LEFT KNEE POLY-LINER EXCHANGE;  Surgeon: Kathryne Hitch, MD;  Location: [facility];  Service: Orthopedics;  Laterality: Left;   KNEE ARTHROSCOPY Right 12/11/2019   Procedure: RIGHT KNEE ARTHROSCOPY WITH PARTIAL MEDIAL MENISCECTOMY;  Surgeon: Kathryne Hitch, MD;  Location: [facility];  Service: Orthopedics;  Laterality: Right;   KNEE ARTHROSCOPY WITH MEDIAL MENISECTOMY Left 07/17/2017   Procedure: LEFT KNEE ARTHROSCOPY WITH MEDIAL LATERAL MENISECTOMY;  Surgeon: Ollen Gross, MD;  Location: [facility];  Service: Orthopedics;  Laterality: Left;   MASS EXCISION N/A 10/11/2016   Procedure: EXCISION OF PERIANAL MASS;  Surgeon: Romie Levee, MD;  Location: [facility];  Service: General;  Laterality: N/A;   REFRACTIVE SURGERY     THYROIDECTOMY  1980   TOTAL ABDOMINAL HYSTERECTOMY  08-25-2002   dr Myrlene Broker   ovaries retained   TOTAL HIP ARTHROPLASTY Right 05/21/2012   Procedure: TOTAL HIP ARTHROPLASTY;  Surgeon: Loanne Drilling, MD;  Location[facility];  Service: Orthopedics;  Laterality: Right;   TOTAL HIP ARTHROPLASTY Left  01-31-2009  dr Lequita Halt   TOTAL HIP REVISION Left 05/22/2017   Procedure: Left hip bearing surface and head revision;  Surgeon: Ollen Gross, MD;  Location: [facility];  Service: Orthopedics;  Laterality: Left;   TOTAL KNEE ARTHROPLASTY Left 01/20/2018   Procedure: LEFT TOTAL KNEE ARTHROPLASTY;  Surgeon: Ollen Gross, MD;  Location[facility];  Service: Orthopedics;  Laterality: Left;    TRANSTHORACIC ECHOCARDIOGRAM  05/03/2016   ef 55-60%/  trivial MR/  mild TR   TUBAL LIGATION      MEDICATIONS:  Adalimumab (HUMIRA, 2 PEN,) 40 MG/0.8ML PNKT   albuterol (VENTOLIN HFA) 108 (90 Base) MCG/ACT inhaler   budesonide (ENTOCORT EC) 3 MG 24 hr capsule   Carbinoxamine Maleate 4 MG TABS   celecoxib (CELEBREX) 200 MG capsule   clobetasol ointment (TEMOVATE) 0.05 %   cyanocobalamin (DODEX) 1000 MCG/ML injection   ergocalciferol (VITAMIN D2) 1.25 MG (50000 UT) capsule   escitalopram (LEXAPRO) 10 MG tablet   Estradiol 0.75 MG/1.25 GM (0.06%) topical gel   flecainide (TAMBOCOR) 50 MG tablet   gabapentin (NEURONTIN) 600 MG tablet   Galcanezumab-gnlm (EMGALITY) 120 MG/ML SOAJ   ipratropium (ATROVENT) 0.03 % nasal spray   levocetirizine (XYZAL) 5 MG tablet   levothyroxine (SYNTHROID) 112 MCG tablet   LORazepam (ATIVAN) 1 MG tablet   LORazepam (ATIVAN) 2 MG tablet   metoprolol succinate (TOPROL-XL) 25 MG 24 hr tablet   montelukast (SINGULAIR) 10 MG tablet   Olopatadine HCl 0.2 % SOLN   pantoprazole (PROTONIX) 40 MG tablet   promethazine (PHENERGAN) 25 MG tablet   sulfamethoxazole-trimethoprim (BACTRIM DS) 800-160 MG tablet   terconazole (TERAZOL 7) 0.4 % vaginal cream   tiZANidine (ZANAFLEX) 4 MG capsule   triamcinolone cream (KENALOG) 0.1 %   Ubrogepant (UBRELVY) 100 MG TABS   No current facility-administered medications for this encounter.    gadobenate dimeglumine (MULTIHANCE) injection 20 mL   [facility], PA-C [facility] [phone]

## 2022-07-05 ENCOUNTER — Ambulatory Visit (INDEPENDENT_AMBULATORY_CARE_PROVIDER_SITE_OTHER): Payer: 59 | Admitting: Internal Medicine

## 2022-07-05 ENCOUNTER — Other Ambulatory Visit: Payer: 59

## 2022-07-05 DIAGNOSIS — E7439 Other disorders of intestinal carbohydrate absorption: Secondary | ICD-10-CM | POA: Diagnosis not present

## 2022-07-05 DIAGNOSIS — Z13220 Encounter for screening for lipoid disorders: Secondary | ICD-10-CM | POA: Diagnosis not present

## 2022-07-05 DIAGNOSIS — F321 Major depressive disorder, single episode, moderate: Secondary | ICD-10-CM

## 2022-07-05 DIAGNOSIS — N2581 Secondary hyperparathyroidism of renal origin: Secondary | ICD-10-CM

## 2022-07-06 ENCOUNTER — Other Ambulatory Visit (HOSPITAL_COMMUNITY): Payer: Self-pay

## 2022-07-06 ENCOUNTER — Other Ambulatory Visit: Payer: Self-pay

## 2022-07-06 LAB — HEMOGLOBIN A1C
Hgb A1c MFr Bld: 5.7 % of total Hgb — ABNORMAL HIGH (ref <5.7)
Mean Plasma Glucose: 117 mg/dL
eAG (mmol/L): 6.5 mmol/L

## 2022-07-06 LAB — LIPID PANEL
Cholesterol: 197 mg/dL (ref <200)
HDL: 86 mg/dL (ref >=50)
LDL Cholesterol (Calc): 99 mg/dL (calc)
Non-HDL Cholesterol (Calc): 111 mg/dL (calc) (ref <130)
Total CHOL/HDL Ratio: 2.3 (calc) (ref <5.0)
Triglycerides: 41 mg/dL (ref <150)

## 2022-07-06 LAB — TSH: TSH: 3.17 mIU/L (ref 0.40–4.50)

## 2022-07-07 ENCOUNTER — Other Ambulatory Visit (HOSPITAL_COMMUNITY): Payer: Self-pay

## 2022-07-09 ENCOUNTER — Telehealth: Payer: Self-pay | Admitting: Family Medicine

## 2022-07-09 ENCOUNTER — Encounter: Payer: Self-pay | Admitting: Internal Medicine

## 2022-07-09 ENCOUNTER — Other Ambulatory Visit: Payer: Self-pay

## 2022-07-09 ENCOUNTER — Other Ambulatory Visit (HOSPITAL_COMMUNITY): Payer: Self-pay

## 2022-07-09 ENCOUNTER — Ambulatory Visit: Payer: 59 | Admitting: Internal Medicine

## 2022-07-09 VITALS — BP 118/72 | HR 62 | Temp 98.1°F | Resp 16 | Ht 67.25 in | Wt 286.4 lb

## 2022-07-09 DIAGNOSIS — M1711 Unilateral primary osteoarthritis, right knee: Secondary | ICD-10-CM

## 2022-07-09 DIAGNOSIS — Z8639 Personal history of other endocrine, nutritional and metabolic disease: Secondary | ICD-10-CM | POA: Diagnosis not present

## 2022-07-09 DIAGNOSIS — E538 Deficiency of other specified B group vitamins: Secondary | ICD-10-CM | POA: Diagnosis not present

## 2022-07-09 DIAGNOSIS — D708 Other neutropenia: Secondary | ICD-10-CM | POA: Diagnosis not present

## 2022-07-09 DIAGNOSIS — G8929 Other chronic pain: Secondary | ICD-10-CM

## 2022-07-09 DIAGNOSIS — E890 Postprocedural hypothyroidism: Secondary | ICD-10-CM | POA: Diagnosis not present

## 2022-07-09 DIAGNOSIS — G4700 Insomnia, unspecified: Secondary | ICD-10-CM

## 2022-07-09 DIAGNOSIS — R748 Abnormal levels of other serum enzymes: Secondary | ICD-10-CM

## 2022-07-09 DIAGNOSIS — F439 Reaction to severe stress, unspecified: Secondary | ICD-10-CM

## 2022-07-09 DIAGNOSIS — N2581 Secondary hyperparathyroidism of renal origin: Secondary | ICD-10-CM

## 2022-07-09 DIAGNOSIS — E7439 Other disorders of intestinal carbohydrate absorption: Secondary | ICD-10-CM

## 2022-07-09 DIAGNOSIS — R8290 Unspecified abnormal findings in urine: Secondary | ICD-10-CM

## 2022-07-09 DIAGNOSIS — Z8669 Personal history of other diseases of the nervous system and sense organs: Secondary | ICD-10-CM

## 2022-07-09 DIAGNOSIS — F411 Generalized anxiety disorder: Secondary | ICD-10-CM

## 2022-07-09 DIAGNOSIS — F419 Anxiety disorder, unspecified: Secondary | ICD-10-CM

## 2022-07-09 DIAGNOSIS — Z9884 Bariatric surgery status: Secondary | ICD-10-CM

## 2022-07-09 DIAGNOSIS — Z6841 Body Mass Index (BMI) 40.0 and over, adult: Secondary | ICD-10-CM

## 2022-07-09 DIAGNOSIS — D122 Benign neoplasm of ascending colon: Secondary | ICD-10-CM

## 2022-07-09 DIAGNOSIS — K912 Postsurgical malabsorption, not elsewhere classified: Secondary | ICD-10-CM

## 2022-07-09 DIAGNOSIS — E611 Iron deficiency: Secondary | ICD-10-CM

## 2022-07-09 DIAGNOSIS — Z0000 Encounter for general adult medical examination without abnormal findings: Secondary | ICD-10-CM

## 2022-07-09 DIAGNOSIS — E559 Vitamin D deficiency, unspecified: Secondary | ICD-10-CM

## 2022-07-09 DIAGNOSIS — G4733 Obstructive sleep apnea (adult) (pediatric): Secondary | ICD-10-CM

## 2022-07-09 DIAGNOSIS — K52832 Lymphocytic colitis: Secondary | ICD-10-CM

## 2022-07-09 DIAGNOSIS — Z96643 Presence of artificial hip joint, bilateral: Secondary | ICD-10-CM

## 2022-07-09 LAB — POCT URINALYSIS DIPSTICK
Bilirubin, UA: NEGATIVE
Glucose, UA: NEGATIVE
Ketones, UA: NEGATIVE
Nitrite, UA: NEGATIVE
Protein, UA: NEGATIVE
Spec Grav, UA: 1.015 (ref 1.010–1.025)
Urobilinogen, UA: 0.2 E.U./dL
pH, UA: 6 (ref 5.0–8.0)

## 2022-07-09 MED ORDER — ALPRAZOLAM 0.5 MG PO TABS
ORAL_TABLET | ORAL | 2 refills | Status: DC
  Filled 2022-07-09: qty 120, 30d supply, fill #0

## 2022-07-09 NOTE — Telephone Encounter (Signed)
LVM for patient to call the office back about lab results. Mychart message sent.

## 2022-07-09 NOTE — Telephone Encounter (Signed)
Patient called to get results about her labs she was calling back.

## 2022-07-09 NOTE — Patient Instructions (Addendum)
Defer pneumococcal 20 vaccine until after surgery. Change Ativan to Xanax 0.5 mg in the morning and at noon then 1 mg at bedtime for anxiety.  Try to watch diet.  Pelvic exam deferred to GYN.  Colonoscopy not due until 2031.  Continue vitamin D supplement weekly.  Continue escitalopram for depression.  Change lorazepam back to his aloe pram for sleep and anxiety.  Continue Celebrex and gabapentin as well as tizanidine for musculoskeletal pain.  Continue vitamin D weekly.  Continue follow-up with cardiologist, Dr. Mayford Knife for history of nonsustained atrial tachycardia.  Continue with Emgality per Dr. Lucia Gaskins for migraine headaches.  Continue Humira 40 mg weekly for hidradenitis suppurativa.  Continue thyroid replacement medication levothyroxine 0.112 mcg daily.  Dr. Loreta Ave follows her for lymphocytic colitis and she is on budesonide 9 mg daily in the morning.  Continue with monthly vitamin D injections by self.  Vaccines reviewed.  Return in 1 year or as needed.

## 2022-07-10 LAB — URINE CULTURE
MICRO NUMBER:: 14948663
SPECIMEN QUALITY:: ADEQUATE

## 2022-07-10 LAB — CREATININE, SERUM: Creat: 1.23 mg/dL — ABNORMAL HIGH (ref 0.50–1.05)

## 2022-07-10 NOTE — Telephone Encounter (Signed)
Can you please let this patient know that this could be a delayed reaction, however, it would not indicate that she is allergic to anything that was not positive at the visit. Skin testing is very immediate and anything that may pop up in a delayed fashion would not be a true positive result. Is the rash gone? Thank you

## 2022-07-12 ENCOUNTER — Ambulatory Visit (INDEPENDENT_AMBULATORY_CARE_PROVIDER_SITE_OTHER): Payer: 59 | Admitting: Internal Medicine

## 2022-07-12 ENCOUNTER — Encounter (INDEPENDENT_AMBULATORY_CARE_PROVIDER_SITE_OTHER): Payer: Self-pay | Admitting: Internal Medicine

## 2022-07-12 VITALS — BP 106/69 | HR 61 | Temp 97.6°F | Ht 67.0 in | Wt 280.0 lb

## 2022-07-12 DIAGNOSIS — R635 Abnormal weight gain: Secondary | ICD-10-CM | POA: Insufficient documentation

## 2022-07-12 DIAGNOSIS — E669 Obesity, unspecified: Secondary | ICD-10-CM | POA: Diagnosis not present

## 2022-07-12 DIAGNOSIS — K52832 Lymphocytic colitis: Secondary | ICD-10-CM

## 2022-07-12 DIAGNOSIS — Z9884 Bariatric surgery status: Secondary | ICD-10-CM | POA: Diagnosis not present

## 2022-07-12 DIAGNOSIS — Z6841 Body Mass Index (BMI) 40.0 and over, adult: Secondary | ICD-10-CM

## 2022-07-12 NOTE — Assessment & Plan Note (Signed)
Reviewed records as well as pathology she has evidence of lymphocytic colitis pattern on colonic mucosa.  She is followed by gastroenterology and is now on steroids.  She may benefit from discontinuation of PPI but I will defer this to prescribing physician.

## 2022-07-12 NOTE — H&P (Signed)
TOTAL KNEE ADMISSION H&P  Patient is being admitted for right total knee arthroplasty.  Subjective:  Chief Complaint:right knee pain.  HPI: Victoria [name], 62 y.o. female, has a history of pain and functional disability in the right knee due to arthritis and has failed non-surgical conservative treatments for greater than 12 weeks to includeNSAID's and/or analgesics, corticosteriod injections, viscosupplementation injections, flexibility and strengthening excercises, supervised PT with diminished ADL's post treatment, use of assistive devices, weight reduction as appropriate, and activity modification.  Onset of symptoms was gradual, starting 3 years ago with gradually worsening course since that time. The patient noted prior procedures on the knee to include  arthroscopy and menisectomy on the right knee(s).  Patient currently rates pain in the right knee(s) at 10 out of 10 with activity. Patient has night pain, worsening of pain with activity and weight bearing, pain that interferes with activities of daily living, pain with passive range of motion, crepitus, and joint swelling.  Patient has evidence of severe arthritis by imaging studies. There is no active infection.  Patient Active Problem List   Diagnosis Date Noted   Weight gain 07/12/2022   Lymphocytic colitis 07/12/2022   Migraine without aura and without status migrainosus, not intractable 07/02/2022   Chronic conjunctivitis of both eyes 06/28/2022   Generalized abdominal pain 05/24/2022   Chronic rhinitis 05/09/2022   Chronic migraine without aura without status migrainosus, not intractable 12/26/2021   Depressed mood 12/19/2021   Obstructive sleep apnea 12/19/2021   Other hypoglycemia-post bariatric 12/05/2021   Secondary hyperparathyroidism (HCC) 12/05/2021   Inadequate fluid intake 12/05/2021   Inadequate sleep hygiene 12/05/2021   Class 3 severe obesity with body mass index (BMI) of 40.0 to 44.9 in adult [facility])  12/05/2021   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   SOBOE (shortness of breath on exertion) 11/21/2021   Other fatigue 11/21/2021   Vitamin D deficiency 11/21/2021   Iron deficiency 11/21/2021   Hypoglycemia after bariatric surgery 11/21/2021   Depression screening 11/21/2021   Class 3 severe obesity with serious comorbidity and body mass index (BMI) of 40.0 to 44.9 in adult [facility]) 11/21/2021   Mild obstructive sleep apnea 11/17/2021   Vertigo 11/17/2021   Excessive daytime sleepiness 10/23/2021   Spinal stenosis of lumbar region 07/07/2020   Unilateral primary osteoarthritis, right knee 06/08/2020   Displacement of lumbar intervertebral disc 06/02/2020   Lumbar spondylosis 06/02/2020   Lumbar stenosis with neurogenic claudication 06/02/2020   Arm numbness 05/30/2020   Chronic neck pain 05/30/2020   Other chronic pain 05/30/2020   Elevated blood-pressure reading, without diagnosis of hypertension 05/30/2020   Status post arthroscopy of right knee 12/11/2019   Polyethylene wear of left knee joint prosthesis (HCC) 09/09/2019   Acute medial meniscal tear, right, subsequent encounter 08/27/2019   History of total left knee replacement 07/16/2019   Peroneal neuropathy at knee, left 06/25/2019   Lumbosacral radiculopathy 06/07/2019   Iron malabsorption 10/16/2018   IDA (iron deficiency anemia) 10/16/2018   OA (osteoarthritis) of knee 01/20/2018   Acute meniscal tear, medial 07/17/2017   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   Failed total hip arthroplasty, 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/2017   Pain in left knee 04/11/2017   Insomnia 11/08/2016   Pernicious anemia 11/08/2016   Leukopenia 07/09/2016   Hypocupremia 07/09/2016   Palpitations 04/12/2016   Shortness of breath 04/12/2016   Reactive hypoglycemia 10/31/2015   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/31/2015   Lap gastric bypass May 2016 07/06/2014   History of Clostridium  difficile infection 08/12/2011   Post-surgical  hypothyroidism 08/26/2010   Gastroesophageal reflux disease 08/26/2010   Vitamin D deficiency 08/26/2010   Fibrocystic disease of breast 08/26/2010   Cobalamin deficiency 08/26/2010   Morbid obesity (HCC) 03/20/2010   Backache 03/20/2010   Past Medical History:  Diagnosis Date   Anxiety    Back pain    Bursitis    right shoulder   Chronic kidney disease    hematuria, has been worked up, her normal   Chronic leukopenia    intermittant since 2010, followed by Dr. Lenord Fellers now   Chronic neck pain    Constipation    Degenerative joint disease of low back 09/02/2015   Dr. Despina Hick   Displacement of lumbar intervertebral disc    Dysrhythmia    Tachycardia, PVCs, PACs   Edema of both lower legs    Fibrocystic breast    Gallbladder problem    GERD (gastroesophageal reflux disease)    Headache    per pt more stress/tension   Heart palpitations    SEE EPIC ENCOUTNER , CARDIOLOGY DR. Gloris Manchester TURNER 2018; reports on 05-16-17 " i haven't had any bouts of those lately"     Hemorrhoids    Hidradenitis suppurativa    History of Clostridium difficile infection 12/2010   History of exercise intolerance    ETT on 04-27-2016-- negative ([facility] treadmill score 7)   History of Helicobacter pylori infection 2001 and 09/ 2012   HSV-2 infection    genital   Hx of adenomatous colonic polyps 10/17/2007   Hydradenitis    per pt currently treated with Humira injection   Hypertension    Hyperthyroidism    Hypocupremia    Hypothyroidism    Hypothyroidism, postsurgical 1980   IBS (irritable bowel syndrome)    Insomnia    Iron deficiency    Joint pain    Leukopenia    Lumbosacral radiculopathy    Lymphocytic colitis 05/14/2022   dx from colonoscopy   Medial meniscus tear    right knee   Mild obstructive sleep apnea    Mitral regurgitation    Mild to moderate by echo 09/2021   Numbness and tingling of right arm    Obesity    Osteoarthritis    Palpitations    Pernicious anemia    b12 def    Peroneal neuropathy    PONV (postoperative nausea and vomiting)    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   Prediabetes    Reactive hypoglycemia followed by pcp   post gastrectomy dumping syndrome   SOB (shortness of breath)    Spinal stenosis    Tendonitis    right shoulder   Varicose veins    Vertigo    Vitamin B 12 deficiency    Vitamin D deficiency     Past Surgical History:  Procedure Laterality Date   BREATH TEK H PYLORI N/A 05/03/2014   Procedure: BREATH TEK H PYLORI;  Surgeon: Wenda Low, MD;  Location: [facility];  Service: General;  Laterality: N/A;   CARDIOVASCULAR STRESS TEST  03/11/2013   dr Gloris Manchester turner   Low risk nuclear study w/ a very small anterior perfusion defect that most likely represents breast attenuation artifact, less likely this could represent a true perfusion abnormality in the distribution of diagonal branch of the LAD/  normal LV function and wall motion , ef 66%   CATARACT EXTRACTION W/ INTRAOCULAR LENS  IMPLANT, BILATERAL  10/2017   CESAREAN SECTION  1985   CHOLECYSTECTOMY  OPEN  1987   COLONOSCOPY  02/2017   Dr Loreta Ave ; per patient they found 7 polyps with polypectomy; on 5 year track    ESOPHAGOGASTRODUODENOSCOPY  02/01/2021   FINE NEEDLE ASPIRATION Left 05/22/2017   Procedure: LEFT KNEE ASPIRATION;  Surgeon: Ollen Gross, MD;  Location: [facility];  Service: Orthopedics;  Laterality: Left;   GASTRIC ROUX-EN-Y N/A 07/06/2014   Procedure: LAPAROSCOPIC ROUX-EN-Y GASTRIC BYPASS, ENTEROLYSIS OF ADHESIONS WITH UPPER ENDOSCOPY;  Surgeon: Luretha Murphy, MD;  Location: [facility];  Service: General;  Laterality: N/A;   HAMMER TOE SURGERY Bilateral 02/17/2008   bilateral foot digit's 2 and 5   HIATAL HERNIA REPAIR N/A 07/06/2014   Procedure: LAPAROSCOPIC REPAIR OF HIATAL HERNIA;  Surgeon: Luretha Murphy, MD;  Location: [facility];  Service: General;  Laterality: N/A;   I & D KNEE WITH POLY EXCHANGE Left 12/11/2019   Procedure: LEFT KNEE POLY-LINER EXCHANGE;   Surgeon: Kathryne Hitch, MD;  Location: [facility];  Service: Orthopedics;  Laterality: Left;   KNEE ARTHROSCOPY Right 12/11/2019   Procedure: RIGHT KNEE ARTHROSCOPY WITH PARTIAL MEDIAL MENISCECTOMY;  Surgeon: Kathryne Hitch, MD;  Location: [facility];  Service: Orthopedics;  Laterality: Right;   KNEE ARTHROSCOPY WITH MEDIAL MENISECTOMY Left 07/17/2017   Procedure: LEFT KNEE ARTHROSCOPY WITH MEDIAL LATERAL MENISECTOMY;  Surgeon: Ollen Gross, MD;  Location: [facility];  Service: Orthopedics;  Laterality: Left;   MASS EXCISION N/A 10/11/2016   Procedure: EXCISION OF PERIANAL MASS;  Surgeon: Romie Levee, MD;  Location: [facility];  Service: General;  Laterality: N/A;   PARTIAL HYSTERECTOMY  2006   has ovaries   REFRACTIVE SURGERY     THYROIDECTOMY  1980   TOTAL ABDOMINAL HYSTERECTOMY  08-25-2002   dr Myrlene Broker   ovaries retained   TOTAL HIP ARTHROPLASTY Right 05/21/2012   Procedure: TOTAL HIP ARTHROPLASTY;  Surgeon: Loanne Drilling, MD;  Location: [facility];  Service: Orthopedics;  Laterality: Right;   TOTAL HIP ARTHROPLASTY Left 01-31-2009  dr Lequita Halt   TOTAL HIP REVISION Left 05/22/2017   Procedure: Left hip bearing surface and head revision;  Surgeon: Ollen Gross, MD;  Location: [facility];  Service: Orthopedics;  Laterality: Left;   TOTAL KNEE ARTHROPLASTY Left 01/20/2018   Procedure: LEFT TOTAL KNEE ARTHROPLASTY;  Surgeon: Ollen Gross, MD;  Location: [facility];  Service: Orthopedics;  Laterality: Left;    TRANSTHORACIC ECHOCARDIOGRAM  05/03/2016   ef 55-60%/  trivial MR/  mild TR   TUBAL LIGATION      No current facility-administered medications for this encounter.   Current Outpatient Medications  Medication Sig Dispense Refill Last Dose   Adalimumab (HUMIRA, 2 PEN,) 40 MG/0.8ML PNKT Inject 1 pen subcutaneously every week (Patient taking differently: once a week.) 4 each 5    albuterol (VENTOLIN HFA) 108 (90 Base) MCG/ACT inhaler Inhale 2 puffs into the  lungs every 4 hours as needed for wheezing or shortness of breath. 18 g 3    budesonide (ENTOCORT EC) 3 MG 24 hr capsule Take 3 capsules (9 mg total) by mouth in the morning. 270 capsule 3    Carbinoxamine Maleate 4 MG TABS Take 1-2 tablets at night as needed for drainage and allergies. May cause drowsiness. 28 tablet 5    celecoxib (CELEBREX) 200 MG capsule Take 1 capsule (200 mg total) by mouth 2 (two) times daily as needed. 60 capsule 3    clobetasol ointment (TEMOVATE) 0.05 % Apply to skin twice daily as directed for up  to 2 weeks as needed 60 g 0    cyanocobalamin (DODEX) 1000 MCG/ML injection INJECT 1 ML INTRAMUSCULARLY EVERY MONTH 3 mL 11    ergocalciferol (VITAMIN D2) 1.25 MG (50000 UT) capsule Take 1 capsule (50,000 Units total) by mouth 2 (two) times a week for Vitamin deficiency and gastric bypass. 8 capsule 1    escitalopram (LEXAPRO) 10 MG tablet Take 1 tablet (10 mg total) by mouth daily. 90 tablet 1    Estradiol 0.75 MG/1.25 GM (0.06%) topical gel Place 1.25 g onto the skin daily. (Patient taking differently: Place 1.25 g onto the skin every other day.) 150 g 0    flecainide (TAMBOCOR) 50 MG tablet Take 1 tablet (50 mg total) by mouth daily at 6 (six) AM. 90 tablet 3    gabapentin (NEURONTIN) 600 MG tablet Take 1 tablet (600 mg total) by mouth 3 (three) times daily. (Patient taking differently: Take 600 mg by mouth at bedtime. Additional 600 mg if needed for pain) 90 tablet 5    ipratropium (ATROVENT) 0.03 % nasal spray Use 1-2 sprays in each nostril up to 3 times daily as needed for runny nose. Can take 15 minutes prior to meals. 30 mL 3    levocetirizine (XYZAL) 5 MG tablet Take 1 tablet (5 mg total) by mouth every evening. 30 tablet 5    levothyroxine (SYNTHROID) 112 MCG tablet Take 1 tablet (112 mcg total) by mouth daily. 90 tablet 1    metoprolol succinate (TOPROL-XL) 25 MG 24 hr tablet Take 1 tablet (25 mg total) by mouth daily. 90 tablet 3    montelukast (SINGULAIR) 10 MG tablet  Take 1 tablet (10 mg total) by mouth at bedtime. 30 tablet 3    Olopatadine HCl 0.2 % SOLN Apply 1 drop to eye daily as needed. (Patient taking differently: Apply 1 drop to eye daily as needed (allergies).) 2.5 mL 5    pantoprazole (PROTONIX) 40 MG tablet Take 1 tablet by mouth daily 20 minutes before breakfast (Patient taking differently: Take 40 mg by mouth daily as needed (Heartburn).) 90 tablet 3    promethazine (PHENERGAN) 25 MG tablet Take 1 tablet (25 mg total) by mouth every 8 (eight) hours as needed for N/V. 30 tablet 5    sulfamethoxazole-trimethoprim (BACTRIM DS) 800-160 MG tablet Take 1 tablet by mouth 2 (two) times daily as needed for flare 60 tablet 1    terconazole (TERAZOL 7) 0.4 % vaginal cream Place 1 applicatorful vaginally at bedtime for seven nights.   Then use one applicatorful once a week for 6 months. 45 g 4    ALPRAZolam (XANAX) 0.5 MG tablet Take 1 tablet (0.5 mg total) by mouth in the morning AND 1 tablet (0.5 mg total) daily at 12 noon AND 2 tablets (1 mg total) at bedtime. 120 tablet 2    Galcanezumab-gnlm (EMGALITY) 120 MG/ML SOAJ Inject 120 mg into the skin every 30 (thirty) days. 1.12 mL 11    tiZANidine (ZANAFLEX) 4 MG capsule Take 1 capsule (4 mg total) by mouth 3 (three) times daily as needed for muscle spasms. 90 capsule 1    triamcinolone cream (KENALOG) 0.1 % Apply 1 Application topically 2 (two) times daily. Use for 2 weeks, and then off for 2 weeks as needed. 80 g 1 Completed Course   Ubrogepant (UBRELVY) 100 MG TABS Take 1 tablet (100 mg total) by mouth every 2 (two) hours as needed. Maximum 200mg  a day. 16 tablet 11    Facility-Administered Medications  Ordered in Other Encounters  Medication Dose Route Frequency Provider Last Rate Last Admin   gadobenate dimeglumine (MULTIHANCE) injection 20 mL  20 mL Intravenous Once PRN Anson Fret, MD       Allergies  Allergen Reactions   Other Other (See Comments)    Developed metal toxicity from a hip replacement  gone awry   Estradiol Rash and Other (See Comments)    Local rash with estradiol patches    Social History   Tobacco Use   Smoking status: Never   Smokeless tobacco: Never  Substance Use Topics   Alcohol use: No    Family History  Problem Relation Age of Onset   High blood pressure Mother    Hypertension Mother    Headache Mother    High Cholesterol Mother    Thyroid disease Mother    Obesity Mother    Kidney disease Mother    Hypertension Father    Diabetes Father    High blood pressure Father    High Cholesterol Father    Heart disease Father    Obesity Father    Diabetes Sister    Hypertension Sister    Thyroid disease Brother    Thyroid disease Maternal Aunt    Migraines Neg Hx      Review of Systems  Objective:  Physical Exam Vitals reviewed.  Constitutional:      Appearance: Normal appearance. She is obese.  HENT:     Head: Normocephalic and atraumatic.  Eyes:     Extraocular Movements: Extraocular movements intact.     Pupils: Pupils are equal, round, and reactive to light.  Cardiovascular:     Rate and Rhythm: Normal rate.     Pulses: Normal pulses.  Pulmonary:     Effort: Pulmonary effort is normal.     Breath sounds: Normal breath sounds.  Abdominal:     Palpations: Abdomen is soft.  Musculoskeletal:     Cervical back: Normal range of motion and neck supple.     Right knee: Effusion, bony tenderness and crepitus present. Decreased range of motion. Tenderness present over the medial joint line, lateral joint line and patellar tendon. Abnormal alignment and abnormal meniscus.  Neurological:     Mental Status: She is alert and oriented to person, place, and time.  Psychiatric:        Behavior: Behavior normal.     Vital signs in last 24 hours: Temp:  [97.6 F (36.4 C)] 97.6 F (36.4 C) (05/16 1100) Pulse Rate:  [61] 61 (05/16 1100) BP: (106)/(69) 106/69 (05/16 1100) SpO2:  [96 %] 96 % (05/16 1100) Weight:  [454 kg] 127 kg (05/16  1100)  Labs:   Estimated body mass index is 43.85 kg/m as calculated from the following:   Height as of 07/12/22: 5\' 7"  (1.702 m).   Weight as of 07/12/22: 127 kg.   Imaging Review Plain radiographs demonstrate severe degenerative joint disease of the right knee(s). The overall alignment ismild varus. The bone quality appears to be excellent for age and reported activity level.      Assessment/Plan:  End stage arthritis, right knee   The patient history, physical examination, clinical judgment of the provider and imaging studies are consistent with end stage degenerative joint disease of the right knee(s) and total knee arthroplasty is deemed medically necessary. The treatment options including medical management, injection therapy arthroscopy and arthroplasty were discussed at length. The risks and benefits of total knee arthroplasty were presented and reviewed. The  risks due to aseptic loosening, infection, stiffness, patella tracking problems, thromboembolic complications and other imponderables were discussed. The patient acknowledged the explanation, agreed to proceed with the plan and consent was signed. Patient is being admitted for inpatient treatment for surgery, pain control, PT, OT, prophylactic antibiotics, VTE prophylaxis, progressive ambulation and ADL's and discharge planning. The patient is planning to be discharged home with home health services

## 2022-07-12 NOTE — Progress Notes (Signed)
Office: [phone]  /  Fax: [phone]  WEIGHT SUMMARY AND BIOMETRICS  Vitals Temp: 97.6 F (36.4 C) BP: 106/69 Pulse Rate: 61 SpO2: 96 %   Anthropometric Measurements Height: 5\' 7"  (1.702 m) Weight: 280 lb (127 kg) BMI (Calculated): 43.84 Weight at Last Visit: 273 lb Weight Lost Since Last Visit: 0 lb Weight Gained Since Last Visit: 7 lb Starting Weight: 273 lb   Body Composition  Body Fat %: 54.4 % Fat Mass (lbs): 152.6 lbs Muscle Mass (lbs): 121.4 lbs Visceral Fat Rating : 19    No data recorded Today's Visit #: 11  Starting Date: 11/20/21   HPI  Chief Complaint: OBESITY  Catessa is here to discuss her progress with her obesity treatment plan. She is on the practicing portion control and making smarter food choices, such as increasing vegetables and decreasing simple carbohydrates and states she is following her eating plan approximately 0 % of the time. She states she is exercising 0 minutes 0 times per week.  Interval History:  Velna Hatchet presents today after a period of absence for a follow-up.  She has gained 7 pounds.  Her adherence to reduced calorie nutrition plan is low.  She has multiple barriers that affect healthy eating.  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  She was recently diagnosed with lymphocytic colitis and is now on steroids.  She was advised to avoid processed foods which is difficult for her.  She is also on chronic PPI therapy which has been associated with condition.  She is frustrated about how difficult it is for her to lose weight.  She also has negative feelings associated with this as she underwent gastric bypass and has not been able to keep the weight off.  Incretin therapy is cost prohibitive.  She has not been evaluated by gastric bypass surgeon.   Barriers identified: having difficulty preparing healthy meals, having difficulty with meal prep and planning, having  difficulty focusing on healthy eating, exposure to enticing environments and or relationships, and difficulty implementing reduced calorie nutrition plan.   Pharmacotherapy for weight loss: She is currently taking no anti-obesity medication.    ASSESSMENT AND PLAN  TREATMENT PLAN FOR OBESITY:  Recommended Dietary Goals  Durga is currently in the action stage of change. As such, her goal is to continue weight management plan. She has agreed to:  She is not following recommended reduced calorie nutrition plan.  We will schedule an extended visit to reassess nutritional intake.  There is a predilection for convenience which makes it hard for her to incorporate healthy foods into her routine.  We will explore other options at the next office visit.  Behavioral Intervention  We discussed the following Behavioral Modification Strategies today: increasing lean protein intake, decreasing simple carbohydrates , increasing vegetables, increasing lower glycemic fruits, increasing fiber rich foods, avoiding skipping meals, increasing water intake, work on meal planning and preparation, and planning for success.  Additional resources provided today: None  Recommended Physical Activity Goals  Lelu has been advised to work up to 150 minutes of moderate intensity aerobic activity a week and strengthening exercises 2-3 times per week for cardiovascular health, weight loss maintenance and preservation of muscle mass.   She has agreed to :  I counseled her on the importance of physical activity.  I cannot really say that she is diet resistant due to variability but she may have unhealthy muscle fibers that compounds the problem  as well as response to nutritional changes.  I encouraged her to think of ways to increase physical activity.  Pharmacotherapy We discussed various medication options to help Floyd with her weight loss efforts and we both agreed to :  Incretin therapy is cost prohibitive.  I  would like to obtain an upper GI series to evaluate pouch size and stoma.  If her stoma has increased in size she may be a candidate for TORe procedure.  We will look at other pharmacotherapy at the next office visit.  ASSOCIATED CONDITIONS ADDRESSED TODAY  History of Roux-en-Y gastric bypass -     DG UGI W DOUBLE CM (HD BA); Future  Weight gain -     DG UGI W DOUBLE CM (HD BA); Future  Obesity with current BMI of 43  Lymphocytic colitis Assessment & Plan: Reviewed records as well as pathology she has evidence of lymphocytic colitis pattern on colonic mucosa.  She is followed by gastroenterology and is now on steroids.  She may benefit from discontinuation of PPI but I will defer this to prescribing physician.     PHYSICAL EXAM:  Blood pressure 106/69, pulse 61, temperature 97.6 F (36.4 C), height 5\' 7"  (1.702 m), weight 280 lb (127 kg), SpO2 96 %. Body mass index is 43.85 kg/m.  General: She is overweight, cooperative, alert, well developed, and in no acute distress. PSYCH: Has normal mood, affect and thought process.   HEENT: EOMI, sclerae are anicteric. Lungs: Normal breathing effort, no conversational dyspnea. Extremities: No edema.  Neurologic: No gross sensory or motor deficits. No tremors or fasciculations noted.    DIAGNOSTIC DATA REVIEWED:  BMET    Component Value Date/Time   NA 141 07/02/2022 1450   NA 141 05/24/2022 1122   K 4.2 07/02/2022 1450   CL 105 07/02/2022 1450   CO2 27 07/02/2022 1450   GLUCOSE 106 (H) 07/02/2022 1450   BUN 13 07/02/2022 1450   BUN 20 05/24/2022 1122   CREATININE 1.23 (H) 07/09/2022 1132   CALCIUM 8.6 (L) 07/02/2022 1450   GFRNONAA 58 (L) 07/02/2022 1450   GFRNONAA 93 06/30/2020 1105   GFRAA 108 06/30/2020 1105   Lab Results  Component Value Date   HGBA1C 5.7 (H) 07/05/2022   HGBA1C 5.7 (H) 08/20/2013   Lab Results  Component Value Date   INSULIN 7.1 05/24/2022   INSULIN 4 09/16/2015   Lab Results  Component Value  Date   TSH 3.17 07/05/2022   CBC    Component Value Date/Time   WBC 5.3 07/02/2022 1450   RBC 3.73 (L) 07/02/2022 1450   HGB 12.2 07/02/2022 1450   HGB 12.0 05/24/2022 1122   HGB 12.6 07/09/2016 1529   HCT 37.6 07/02/2022 1450   HCT 37.4 05/24/2022 1122   HCT 38.2 07/09/2016 1529   PLT 189 07/02/2022 1450   PLT 192 05/24/2022 1122   MCV 100.8 (H) 07/02/2022 1450   MCV 102 (H) 05/24/2022 1122   MCV 96.9 07/09/2016 1529   MCH 32.7 07/02/2022 1450   MCHC 32.4 07/02/2022 1450   RDW 14.0 07/02/2022 1450   RDW 12.2 05/24/2022 1122   RDW 13.5 07/09/2016 1529   Iron Studies    Component Value Date/Time   IRON 123 01/12/2022 1245   IRON 49 09/18/2018 0853   TIBC 343 01/12/2022 1245   TIBC 415 09/18/2018 0853   FERRITIN 21 01/12/2022 1246   FERRITIN 11 (L) 09/18/2018 0853   IRONPCTSAT 36 (H) 01/12/2022 1245   IRONPCTSAT  40 06/29/2021 1105   Lipid Panel     Component Value Date/Time   CHOL 197 07/05/2022 0921   CHOL 191 02/20/2022 0809   TRIG 41 07/05/2022 0921   HDL 86 07/05/2022 0921   HDL 84 02/20/2022 0809   CHOLHDL 2.3 07/05/2022 0921   VLDL 9 03/12/2016 1038   LDLCALC 99 07/05/2022 0921   Hepatic Function Panel     Component Value Date/Time   PROT 8.0 07/02/2022 1450   PROT 7.1 05/24/2022 1122   ALBUMIN 4.1 07/02/2022 1450   ALBUMIN 4.1 05/24/2022 1122   AST 19 07/02/2022 1450   AST 26 10/16/2018 0903   ALT 28 07/02/2022 1450   ALT 26 10/16/2018 0903   ALKPHOS 52 07/02/2022 1450   BILITOT 0.4 07/02/2022 1450   BILITOT <0.2 05/24/2022 1122   BILITOT 0.5 10/16/2018 0903   BILIDIR 0.1 03/16/2013 0740      Component Value Date/Time   TSH 3.17 07/05/2022 0921   Nutritional Lab Results  Component Value Date   VD25OH 50.1 02/20/2022   VD25OH 29.4 (L) 11/20/2021   VD25OH 18.9 (L) 07/07/2020     Return in about 4 weeks (around 08/09/2022) for For Weight Mangement with Dr. Rikki Spearing 40 minutes, complex obesity.. She was informed of the importance of  frequent follow up visits to maximize her success with intensive lifestyle modifications for her multiple health conditions.   ATTESTASTION STATEMENTS:  Reviewed by clinician on day of visit: allergies, medications, problem list, medical history, surgical history, family history, social history, and previous encounter notes.    I have spent 40 minutes in the care of the patient today including: preparing to see patient (e.g. review and interpretation of tests, old notes ), obtaining and/or reviewing separately obtained history, counseling and educating the patient, ordering medications, test and procedures, and documenting clinical information in the electronic or other health care record  Worthy Rancher, MD

## 2022-07-13 ENCOUNTER — Ambulatory Visit (HOSPITAL_COMMUNITY): Payer: 59 | Admitting: Physician Assistant

## 2022-07-13 ENCOUNTER — Inpatient Hospital Stay (HOSPITAL_COMMUNITY)
Admission: AD | Admit: 2022-07-13 | Discharge: 2022-07-16 | DRG: 470 | Disposition: A | Discharge status: Home / Self Care | Payer: 59 | Attending: Orthopaedic Surgery | Admitting: Orthopaedic Surgery

## 2022-07-13 ENCOUNTER — Encounter (HOSPITAL_COMMUNITY)
Admission: AD | Disposition: A | Discharge status: Home / Self Care | Payer: Self-pay | Source: Home / Self Care | Attending: Orthopaedic Surgery

## 2022-07-13 ENCOUNTER — Encounter (HOSPITAL_COMMUNITY): Payer: Self-pay | Admitting: Orthopaedic Surgery

## 2022-07-13 ENCOUNTER — Observation Stay (HOSPITAL_COMMUNITY): Payer: 59

## 2022-07-13 ENCOUNTER — Ambulatory Visit (HOSPITAL_BASED_OUTPATIENT_CLINIC_OR_DEPARTMENT_OTHER): Payer: 59 | Admitting: Registered Nurse

## 2022-07-13 ENCOUNTER — Other Ambulatory Visit: Payer: Self-pay

## 2022-07-13 DIAGNOSIS — G4733 Obstructive sleep apnea (adult) (pediatric): Secondary | ICD-10-CM | POA: Diagnosis present

## 2022-07-13 DIAGNOSIS — Z8349 Family history of other endocrine, nutritional and metabolic diseases: Secondary | ICD-10-CM

## 2022-07-13 DIAGNOSIS — G8918 Other acute postprocedural pain: Secondary | ICD-10-CM | POA: Diagnosis not present

## 2022-07-13 DIAGNOSIS — Z96651 Presence of right artificial knee joint: Secondary | ICD-10-CM

## 2022-07-13 DIAGNOSIS — Z6841 Body Mass Index (BMI) 40.0 and over, adult: Secondary | ICD-10-CM

## 2022-07-13 DIAGNOSIS — M25761 Osteophyte, right knee: Secondary | ICD-10-CM | POA: Diagnosis present

## 2022-07-13 DIAGNOSIS — Z9842 Cataract extraction status, left eye: Secondary | ICD-10-CM

## 2022-07-13 DIAGNOSIS — G4730 Sleep apnea, unspecified: Secondary | ICD-10-CM

## 2022-07-13 DIAGNOSIS — E890 Postprocedural hypothyroidism: Secondary | ICD-10-CM | POA: Diagnosis present

## 2022-07-13 DIAGNOSIS — Z888 Allergy status to other drugs, medicaments and biological substances status: Secondary | ICD-10-CM

## 2022-07-13 DIAGNOSIS — Z83438 Family history of other disorder of lipoprotein metabolism and other lipidemia: Secondary | ICD-10-CM

## 2022-07-13 DIAGNOSIS — Z833 Family history of diabetes mellitus: Secondary | ICD-10-CM

## 2022-07-13 DIAGNOSIS — G8929 Other chronic pain: Secondary | ICD-10-CM | POA: Diagnosis present

## 2022-07-13 DIAGNOSIS — I129 Hypertensive chronic kidney disease with stage 1 through stage 4 chronic kidney disease, or unspecified chronic kidney disease: Secondary | ICD-10-CM | POA: Diagnosis present

## 2022-07-13 DIAGNOSIS — Z841 Family history of disorders of kidney and ureter: Secondary | ICD-10-CM

## 2022-07-13 DIAGNOSIS — N189 Chronic kidney disease, unspecified: Secondary | ICD-10-CM | POA: Diagnosis not present

## 2022-07-13 DIAGNOSIS — Z79890 Hormone replacement therapy (postmenopausal): Secondary | ICD-10-CM

## 2022-07-13 DIAGNOSIS — Z8619 Personal history of other infectious and parasitic diseases: Secondary | ICD-10-CM

## 2022-07-13 DIAGNOSIS — Z9841 Cataract extraction status, right eye: Secondary | ICD-10-CM

## 2022-07-13 DIAGNOSIS — Z8249 Family history of ischemic heart disease and other diseases of the circulatory system: Secondary | ICD-10-CM

## 2022-07-13 DIAGNOSIS — R7303 Prediabetes: Secondary | ICD-10-CM | POA: Diagnosis present

## 2022-07-13 DIAGNOSIS — E0590 Thyrotoxicosis, unspecified without thyrotoxic crisis or storm: Secondary | ICD-10-CM | POA: Diagnosis present

## 2022-07-13 DIAGNOSIS — Z96643 Presence of artificial hip joint, bilateral: Secondary | ICD-10-CM | POA: Diagnosis present

## 2022-07-13 DIAGNOSIS — Z01818 Encounter for other preprocedural examination: Secondary | ICD-10-CM

## 2022-07-13 DIAGNOSIS — Z961 Presence of intraocular lens: Secondary | ICD-10-CM | POA: Diagnosis present

## 2022-07-13 DIAGNOSIS — Z79899 Other long term (current) drug therapy: Secondary | ICD-10-CM

## 2022-07-13 DIAGNOSIS — M1711 Unilateral primary osteoarthritis, right knee: Principal | ICD-10-CM | POA: Diagnosis present

## 2022-07-13 DIAGNOSIS — Z471 Aftercare following joint replacement surgery: Secondary | ICD-10-CM | POA: Diagnosis not present

## 2022-07-13 DIAGNOSIS — F419 Anxiety disorder, unspecified: Secondary | ICD-10-CM | POA: Diagnosis present

## 2022-07-13 DIAGNOSIS — Z96652 Presence of left artificial knee joint: Secondary | ICD-10-CM | POA: Diagnosis present

## 2022-07-13 DIAGNOSIS — Z9884 Bariatric surgery status: Secondary | ICD-10-CM

## 2022-07-13 DIAGNOSIS — Z86010 Personal history of colonic polyps: Secondary | ICD-10-CM

## 2022-07-13 DIAGNOSIS — Z90711 Acquired absence of uterus with remaining cervical stump: Secondary | ICD-10-CM

## 2022-07-13 DIAGNOSIS — Z9049 Acquired absence of other specified parts of digestive tract: Secondary | ICD-10-CM

## 2022-07-13 HISTORY — PX: TOTAL KNEE ARTHROPLASTY: SHX125

## 2022-07-13 SURGERY — ARTHROPLASTY, KNEE, TOTAL
Anesthesia: General | Site: Knee | Laterality: Right

## 2022-07-13 MED ORDER — ACETAMINOPHEN 500 MG PO TABS: 1000.0000 mg | ORAL_TABLET | Freq: Once | ORAL | Status: AC

## 2022-07-13 MED ORDER — ONDANSETRON HCL 4 MG PO TABS: 4.0000 mg | ORAL_TABLET | Freq: Four times a day (QID) | ORAL | Status: DC | PRN

## 2022-07-13 MED ORDER — ALBUTEROL SULFATE HFA 108 (90 BASE) MCG/ACT IN AERS: 2.0000 | INHALATION_SPRAY | RESPIRATORY_TRACT | Status: DC | PRN

## 2022-07-13 MED ORDER — CEFAZOLIN SODIUM-DEXTROSE 2-4 GM/100ML-% IV SOLN
INTRAVENOUS | Status: AC
  Filled 2022-07-13: qty 100

## 2022-07-13 MED ORDER — FLECAINIDE ACETATE 50 MG PO TABS
50.0000 mg | ORAL_TABLET | Freq: Every day | ORAL | Status: DC
  Administered 2022-07-14 – 2022-07-15 (×2): 50 mg via ORAL
  Filled 2022-07-13 (×3): qty 1

## 2022-07-13 MED ORDER — METOCLOPRAMIDE HCL 5 MG/ML IJ SOLN: 5.0000 mg | Freq: Three times a day (TID) | INTRAMUSCULAR | Status: DC | PRN

## 2022-07-13 MED ORDER — HYDROMORPHONE HCL 1 MG/ML IJ SOLN
INTRAMUSCULAR | Status: DC | PRN
  Administered 2022-07-13 (×3): .5 mg via INTRAVENOUS

## 2022-07-13 MED ORDER — PROMETHAZINE HCL 25 MG PO TABS: 25.0000 mg | ORAL_TABLET | Freq: Three times a day (TID) | ORAL | Status: DC | PRN

## 2022-07-13 MED ORDER — FENTANYL CITRATE (PF) 100 MCG/2ML IJ SOLN
INTRAMUSCULAR | Status: AC
  Filled 2022-07-13: qty 2

## 2022-07-13 MED ORDER — ONDANSETRON HCL 4 MG/2ML IJ SOLN
4.0000 mg | Freq: Four times a day (QID) | INTRAMUSCULAR | Status: DC | PRN
  Administered 2022-07-13: 4 mg via INTRAVENOUS
  Filled 2022-07-13: qty 2

## 2022-07-13 MED ORDER — VITAMIN D (ERGOCALCIFEROL) 1.25 MG (50000 UNIT) PO CAPS
50000.0000 [IU] | ORAL_CAPSULE | ORAL | Status: DC
  Administered 2022-07-16: 50000 [IU] via ORAL
  Filled 2022-07-13: qty 1

## 2022-07-13 MED ORDER — ACETAMINOPHEN 325 MG PO TABS
325.0000 mg | ORAL_TABLET | Freq: Four times a day (QID) | ORAL | Status: DC | PRN
  Administered 2022-07-14 – 2022-07-16 (×4): 650 mg via ORAL
  Filled 2022-07-13 (×4): qty 2

## 2022-07-13 MED ORDER — DOCUSATE SODIUM 100 MG PO CAPS
100.0000 mg | ORAL_CAPSULE | Freq: Two times a day (BID) | ORAL | Status: DC
  Administered 2022-07-13 – 2022-07-16 (×6): 100 mg via ORAL
  Filled 2022-07-13 (×6): qty 1

## 2022-07-13 MED ORDER — SODIUM CHLORIDE 0.9 % IV BOLUS
500.0000 mL | Freq: Once | INTRAVENOUS | Status: AC
  Administered 2022-07-13: 500 mL via INTRAVENOUS

## 2022-07-13 MED ORDER — CHLORHEXIDINE GLUCONATE 0.12 % MT SOLN
15.0000 mL | Freq: Once | OROMUCOSAL | Status: AC
  Administered 2022-07-13: 15 mL via OROMUCOSAL

## 2022-07-13 MED ORDER — POVIDONE-IODINE 10 % EX SWAB: 2.0000 | Freq: Once | CUTANEOUS | Status: DC

## 2022-07-13 MED ORDER — AMISULPRIDE (ANTIEMETIC) 5 MG/2ML IV SOLN: 10.0000 mg | Freq: Once | INTRAVENOUS | Status: DC | PRN

## 2022-07-13 MED ORDER — MONTELUKAST SODIUM 10 MG PO TABS
10.0000 mg | ORAL_TABLET | Freq: Every day | ORAL | Status: DC
  Administered 2022-07-13 – 2022-07-15 (×3): 10 mg via ORAL
  Filled 2022-07-13 (×3): qty 1

## 2022-07-13 MED ORDER — CEFAZOLIN SODIUM-DEXTROSE 2-4 GM/100ML-% IV SOLN
2.0000 g | Freq: Four times a day (QID) | INTRAVENOUS | Status: AC
  Administered 2022-07-13 (×2): 2 g via INTRAVENOUS
  Filled 2022-07-13 (×2): qty 100

## 2022-07-13 MED ORDER — PHENOL 1.4 % MT LIQD: 1.0000 | OROMUCOSAL | Status: DC | PRN

## 2022-07-13 MED ORDER — ALPRAZOLAM 0.5 MG PO TABS
0.5000 mg | ORAL_TABLET | Freq: Three times a day (TID) | ORAL | Status: DC | PRN
  Administered 2022-07-14 – 2022-07-15 (×3): 0.5 mg via ORAL
  Filled 2022-07-13 (×3): qty 1

## 2022-07-13 MED ORDER — ROPIVACAINE HCL 5 MG/ML IJ SOLN
INTRAMUSCULAR | Status: DC | PRN
  Administered 2022-07-13: 20 mL via PERINEURAL

## 2022-07-13 MED ORDER — ORAL CARE MOUTH RINSE: 15.0000 mL | Freq: Once | OROMUCOSAL | Status: AC

## 2022-07-13 MED ORDER — ROCURONIUM BROMIDE 10 MG/ML (PF) SYRINGE
PREFILLED_SYRINGE | INTRAVENOUS | Status: DC | PRN
  Administered 2022-07-13: 60 mg via INTRAVENOUS

## 2022-07-13 MED ORDER — BUDESONIDE 3 MG PO CPEP
9.0000 mg | ORAL_CAPSULE | Freq: Every day | ORAL | Status: DC
  Filled 2022-07-13 (×3): qty 3

## 2022-07-13 MED ORDER — FENTANYL CITRATE PF 50 MCG/ML IJ SOSY
25.0000 ug | PREFILLED_SYRINGE | INTRAMUSCULAR | Status: DC | PRN
  Administered 2022-07-13 (×3): 50 ug via INTRAVENOUS

## 2022-07-13 MED ORDER — SODIUM CHLORIDE 0.9 % IV SOLN: INTRAVENOUS | Status: DC

## 2022-07-13 MED ORDER — CEFAZOLIN IN SODIUM CHLORIDE 3-0.9 GM/100ML-% IV SOLN
3.0000 g | INTRAVENOUS | Status: AC
  Administered 2022-07-13: 3 g via INTRAVENOUS
  Filled 2022-07-13: qty 100

## 2022-07-13 MED ORDER — GABAPENTIN 300 MG PO CAPS
600.0000 mg | ORAL_CAPSULE | Freq: Every day | ORAL | Status: DC
  Administered 2022-07-13 – 2022-07-15 (×3): 600 mg via ORAL
  Filled 2022-07-13 (×3): qty 2

## 2022-07-13 MED ORDER — HYDROMORPHONE HCL 1 MG/ML IJ SOLN
0.2500 mg | INTRAMUSCULAR | Status: DC | PRN
  Administered 2022-07-13: 0.25 mg via INTRAVENOUS

## 2022-07-13 MED ORDER — PANTOPRAZOLE SODIUM 40 MG PO TBEC
40.0000 mg | DELAYED_RELEASE_TABLET | Freq: Every day | ORAL | Status: DC
  Administered 2022-07-13 – 2022-07-16 (×4): 40 mg via ORAL
  Filled 2022-07-13 (×4): qty 1

## 2022-07-13 MED ORDER — KETAMINE HCL 10 MG/ML IJ SOLN
INTRAMUSCULAR | Status: DC | PRN
  Administered 2022-07-13: 20 mg via INTRAVENOUS
  Administered 2022-07-13: 10 mg via INTRAVENOUS
  Administered 2022-07-13: 20 mg via INTRAVENOUS

## 2022-07-13 MED ORDER — BUPIVACAINE HCL (PF) 0.25 % IJ SOLN
INTRAMUSCULAR | Status: DC | PRN
  Administered 2022-07-13: 50 mL

## 2022-07-13 MED ORDER — POLYETHYLENE GLYCOL 3350 17 G PO PACK: 17.0000 g | PACK | Freq: Every day | ORAL | Status: DC | PRN

## 2022-07-13 MED ORDER — LEVOTHYROXINE SODIUM 112 MCG PO TABS
112.0000 ug | ORAL_TABLET | Freq: Every day | ORAL | Status: DC
  Administered 2022-07-14 – 2022-07-16 (×3): 112 ug via ORAL
  Filled 2022-07-13 (×3): qty 1

## 2022-07-13 MED ORDER — FENTANYL CITRATE (PF) 100 MCG/2ML IJ SOLN
INTRAMUSCULAR | Status: DC | PRN
  Administered 2022-07-13: 50 ug via INTRAVENOUS
  Administered 2022-07-13: 100 ug via INTRAVENOUS
  Administered 2022-07-13: 50 ug via INTRAVENOUS

## 2022-07-13 MED ORDER — APIXABAN 2.5 MG PO TABS
2.5000 mg | ORAL_TABLET | Freq: Two times a day (BID) | ORAL | Status: DC
  Administered 2022-07-14 – 2022-07-16 (×5): 2.5 mg via ORAL
  Filled 2022-07-13 (×5): qty 1

## 2022-07-13 MED ORDER — PROPOFOL 10 MG/ML IV BOLUS
INTRAVENOUS | Status: DC | PRN
  Administered 2022-07-13: 25 ug/kg/min via INTRAVENOUS
  Administered 2022-07-13: 200 mg via INTRAVENOUS

## 2022-07-13 MED ORDER — HYDROMORPHONE HCL 1 MG/ML IJ SOLN
0.5000 mg | INTRAMUSCULAR | Status: DC | PRN
  Administered 2022-07-14: 1 mg via INTRAVENOUS
  Administered 2022-07-14: 0.5 mg via INTRAVENOUS
  Administered 2022-07-14 – 2022-07-15 (×3): 1 mg via INTRAVENOUS
  Filled 2022-07-13 (×6): qty 1

## 2022-07-13 MED ORDER — TRANEXAMIC ACID-NACL 1000-0.7 MG/100ML-% IV SOLN
INTRAVENOUS | Status: AC
  Filled 2022-07-13: qty 100

## 2022-07-13 MED ORDER — ACETAMINOPHEN 500 MG PO TABS
ORAL_TABLET | ORAL | Status: AC
  Administered 2022-07-13: 1000 mg via ORAL
  Filled 2022-07-13: qty 2

## 2022-07-13 MED ORDER — ONDANSETRON HCL 4 MG/2ML IJ SOLN
INTRAMUSCULAR | Status: AC
  Filled 2022-07-13: qty 2

## 2022-07-13 MED ORDER — HYDROMORPHONE HCL 2 MG/ML IJ SOLN
INTRAMUSCULAR | Status: AC
  Filled 2022-07-13: qty 1

## 2022-07-13 MED ORDER — PROPOFOL 10 MG/ML IV BOLUS
INTRAVENOUS | Status: AC
  Filled 2022-07-13: qty 20

## 2022-07-13 MED ORDER — ALUM & MAG HYDROXIDE-SIMETH 200-200-20 MG/5ML PO SUSP: 30.0000 mL | ORAL | Status: DC | PRN

## 2022-07-13 MED ORDER — ONDANSETRON HCL 4 MG/2ML IJ SOLN
INTRAMUSCULAR | Status: DC | PRN
  Administered 2022-07-13: 4 mg via INTRAVENOUS

## 2022-07-13 MED ORDER — TRANEXAMIC ACID-NACL 1000-0.7 MG/100ML-% IV SOLN
1000.0000 mg | INTRAVENOUS | Status: AC
  Administered 2022-07-13: 1000 mg via INTRAVENOUS

## 2022-07-13 MED ORDER — SUGAMMADEX SODIUM 200 MG/2ML IV SOLN
INTRAVENOUS | Status: DC | PRN
  Administered 2022-07-13: 200 mg via INTRAVENOUS

## 2022-07-13 MED ORDER — KETAMINE HCL 50 MG/5ML IJ SOSY
PREFILLED_SYRINGE | INTRAMUSCULAR | Status: AC
  Filled 2022-07-13: qty 5

## 2022-07-13 MED ORDER — TIZANIDINE HCL 4 MG PO TABS
4.0000 mg | ORAL_TABLET | Freq: Four times a day (QID) | ORAL | Status: DC | PRN
  Administered 2022-07-13 – 2022-07-16 (×5): 4 mg via ORAL
  Filled 2022-07-13 (×5): qty 1

## 2022-07-13 MED ORDER — OXYCODONE HCL 5 MG PO TABS
5.0000 mg | ORAL_TABLET | ORAL | Status: DC | PRN
  Administered 2022-07-14 – 2022-07-16 (×5): 10 mg via ORAL
  Filled 2022-07-13 (×9): qty 2

## 2022-07-13 MED ORDER — OXYCODONE HCL 5 MG PO TABS
10.0000 mg | ORAL_TABLET | ORAL | Status: DC | PRN
  Administered 2022-07-13 – 2022-07-15 (×5): 10 mg via ORAL
  Administered 2022-07-15: 15 mg via ORAL
  Administered 2022-07-16: 10 mg via ORAL
  Filled 2022-07-13: qty 3
  Filled 2022-07-13 (×2): qty 2

## 2022-07-13 MED ORDER — FENTANYL CITRATE PF 50 MCG/ML IJ SOSY
PREFILLED_SYRINGE | INTRAMUSCULAR | Status: AC
  Filled 2022-07-13: qty 1

## 2022-07-13 MED ORDER — MIDAZOLAM HCL 2 MG/2ML IJ SOLN
1.0000 mg | INTRAMUSCULAR | Status: DC
  Administered 2022-07-13: 1 mg via INTRAVENOUS
  Filled 2022-07-13: qty 2

## 2022-07-13 MED ORDER — FENTANYL CITRATE PF 50 MCG/ML IJ SOSY
50.0000 ug | PREFILLED_SYRINGE | INTRAMUSCULAR | Status: DC
  Administered 2022-07-13: 50 ug via INTRAVENOUS
  Filled 2022-07-13: qty 2

## 2022-07-13 MED ORDER — METOCLOPRAMIDE HCL 5 MG PO TABS: 5.0000 mg | ORAL_TABLET | Freq: Three times a day (TID) | ORAL | Status: DC | PRN

## 2022-07-13 MED ORDER — ESCITALOPRAM OXALATE 10 MG PO TABS
10.0000 mg | ORAL_TABLET | Freq: Every day | ORAL | Status: DC
  Administered 2022-07-14 – 2022-07-16 (×3): 10 mg via ORAL
  Filled 2022-07-13 (×3): qty 1

## 2022-07-13 MED ORDER — PROPOFOL 1000 MG/100ML IV EMUL
INTRAVENOUS | Status: AC
  Filled 2022-07-13: qty 100

## 2022-07-13 MED ORDER — CLONIDINE HCL (ANALGESIA) 100 MCG/ML EP SOLN
EPIDURAL | Status: DC | PRN
  Administered 2022-07-13: 50 ug

## 2022-07-13 MED ORDER — DEXAMETHASONE SODIUM PHOSPHATE 10 MG/ML IJ SOLN
INTRAMUSCULAR | Status: AC
  Filled 2022-07-13: qty 1

## 2022-07-13 MED ORDER — DEXAMETHASONE SODIUM PHOSPHATE 10 MG/ML IJ SOLN
INTRAMUSCULAR | Status: DC | PRN
  Administered 2022-07-13: 8 mg via INTRAVENOUS

## 2022-07-13 MED ORDER — MENTHOL 3 MG MT LOZG: 1.0000 | LOZENGE | OROMUCOSAL | Status: DC | PRN

## 2022-07-13 MED ORDER — HYDROMORPHONE HCL 1 MG/ML IJ SOLN
INTRAMUSCULAR | Status: AC
  Filled 2022-07-13: qty 1

## 2022-07-13 MED ORDER — LACTATED RINGERS IV SOLN: INTRAVENOUS | Status: DC

## 2022-07-13 MED ORDER — FENTANYL CITRATE PF 50 MCG/ML IJ SOSY
PREFILLED_SYRINGE | INTRAMUSCULAR | Status: AC
  Filled 2022-07-13: qty 2

## 2022-07-13 MED ORDER — SODIUM CHLORIDE 0.9 % IR SOLN
Status: DC | PRN
  Administered 2022-07-13: 1000 mL

## 2022-07-13 MED ORDER — SCOPOLAMINE 1 MG/3DAYS TD PT72
MEDICATED_PATCH | TRANSDERMAL | Status: AC
  Administered 2022-07-13: 1.5 mg via TRANSDERMAL
  Filled 2022-07-13: qty 1

## 2022-07-13 MED ORDER — SCOPOLAMINE 1 MG/3DAYS TD PT72: 1.0000 | MEDICATED_PATCH | TRANSDERMAL | Status: DC

## 2022-07-13 MED ORDER — DIPHENHYDRAMINE HCL 12.5 MG/5ML PO ELIX
12.5000 mg | ORAL_SOLUTION | ORAL | Status: DC | PRN
  Administered 2022-07-14: 25 mg via ORAL
  Filled 2022-07-13 (×2): qty 10

## 2022-07-13 MED ORDER — METOPROLOL SUCCINATE ER 25 MG PO TB24
25.0000 mg | ORAL_TABLET | Freq: Every day | ORAL | Status: DC
  Administered 2022-07-14: 25 mg via ORAL
  Filled 2022-07-13 (×2): qty 1

## 2022-07-13 MED ORDER — BUPIVACAINE HCL 0.25 % IJ SOLN
INTRAMUSCULAR | Status: AC
  Filled 2022-07-13: qty 1

## 2022-07-13 MED ORDER — 0.9 % SODIUM CHLORIDE (POUR BTL) OPTIME
TOPICAL | Status: DC | PRN
  Administered 2022-07-13: 1000 mL

## 2022-07-13 SURGICAL SUPPLY — 63 items
APL SKNCLS STERI-STRIP NONHPOA (GAUZE/BANDAGES/DRESSINGS)
BAG COUNTER SPONGE SURGICOUNT (BAG) IMPLANT
BAG SPEC THK2 15X12 ZIP CLS (MISCELLANEOUS) ×1
BAG SPNG CNTER NS LX DISP (BAG)
BAG ZIPLOCK 12X15 (MISCELLANEOUS) ×1 IMPLANT
BASEPLATE TIB CMT FB PCKT SZ6 (Knees) IMPLANT
BENZOIN TINCTURE PRP APPL 2/3 (GAUZE/BANDAGES/DRESSINGS) IMPLANT
BLADE SAG 18X100X1.27 (BLADE) ×1 IMPLANT
BLADE SAW SGTL 13.0X1.19X90.0M (BLADE) IMPLANT
BLADE SURG SZ10 CARB STEEL (BLADE) ×2 IMPLANT
BNDG CMPR 5X62 HK CLSR LF (GAUZE/BANDAGES/DRESSINGS) ×2
BNDG CMPR 6"X 5 YARDS HK CLSR (GAUZE/BANDAGES/DRESSINGS) ×2
BNDG CMPR MED 10X6 ELC LF (GAUZE/BANDAGES/DRESSINGS) ×2
BNDG ELASTIC 6INX 5YD STR LF (GAUZE/BANDAGES/DRESSINGS) ×2 IMPLANT
BNDG ELASTIC 6X10 VLCR STRL LF (GAUZE/BANDAGES/DRESSINGS) IMPLANT
BOWL SMART MIX CTS (DISPOSABLE) IMPLANT
BSPLAT TIB 6 CMNT FXBRNG STRL (Knees) ×1 IMPLANT
CEMENT HV SMART SET (Cement) IMPLANT
COMP FEM CMT ATTUNE KNEE 5 RT (Joint) ×1 IMPLANT
COMPONENT FEM CMT ATTN KN 5 RT (Joint) IMPLANT
COOLER ICEMAN CLASSIC (MISCELLANEOUS) ×1 IMPLANT
COVER SURGICAL LIGHT HANDLE (MISCELLANEOUS) ×1 IMPLANT
CUFF TOURN SGL QUICK 34 (TOURNIQUET CUFF) ×1
CUFF TRNQT CYL 34X4.125X (TOURNIQUET CUFF) ×1 IMPLANT
DRAPE INCISE IOBAN 66X45 STRL (DRAPES) ×1 IMPLANT
DRAPE U-SHAPE 47X51 STRL (DRAPES) ×1 IMPLANT
DURAPREP 26ML APPLICATOR (WOUND CARE) ×1 IMPLANT
ELECT BLADE TIP CTD 4 INCH (ELECTRODE) ×1 IMPLANT
ELECT REM PT RETURN 15FT ADLT (MISCELLANEOUS) ×1 IMPLANT
GAUZE PAD ABD 8X10 STRL (GAUZE/BANDAGES/DRESSINGS) ×2 IMPLANT
GAUZE SPONGE 4X4 12PLY STRL (GAUZE/BANDAGES/DRESSINGS) ×1 IMPLANT
GAUZE XEROFORM 1X8 LF (GAUZE/BANDAGES/DRESSINGS) IMPLANT
GLOVE BIO SURGEON STRL SZ7.5 (GLOVE) ×1 IMPLANT
GLOVE BIOGEL PI IND STRL 8 (GLOVE) ×2 IMPLANT
GLOVE ECLIPSE 8.0 STRL XLNG CF (GLOVE) ×1 IMPLANT
GOWN STRL REUS W/ TWL XL LVL3 (GOWN DISPOSABLE) ×2 IMPLANT
GOWN STRL REUS W/TWL XL LVL3 (GOWN DISPOSABLE) ×2
HANDPIECE INTERPULSE COAX TIP (DISPOSABLE) ×1
HOLDER FOLEY CATH W/STRAP (MISCELLANEOUS) IMPLANT
IMMOBILIZER KNEE 20 (SOFTGOODS) ×1
IMMOBILIZER KNEE 20 THIGH 36 (SOFTGOODS) ×1 IMPLANT
INSERT TIB ATTUNE KNEE 5 8 RT (Insert) IMPLANT
KIT TURNOVER KIT A (KITS) IMPLANT
NS IRRIG 1000ML POUR BTL (IV SOLUTION) ×1 IMPLANT
PACK TOTAL KNEE CUSTOM (KITS) ×1 IMPLANT
PAD COLD SHLDR WRAP-ON (PAD) ×1 IMPLANT
PADDING CAST COTTON 6X4 STRL (CAST SUPPLIES) ×2 IMPLANT
PATELLA MEDIAL ATTUN 35MM KNEE (Knees) IMPLANT
PIN STEINMAN FIXATION KNEE (PIN) IMPLANT
PROTECTOR NERVE ULNAR (MISCELLANEOUS) ×1 IMPLANT
SET HNDPC FAN SPRY TIP SCT (DISPOSABLE) ×1 IMPLANT
SET PAD KNEE POSITIONER (MISCELLANEOUS) ×1 IMPLANT
SPIKE FLUID TRANSFER (MISCELLANEOUS) IMPLANT
STAPLER VISISTAT 35W (STAPLE) IMPLANT
STRIP CLOSURE SKIN 1/2X4 (GAUZE/BANDAGES/DRESSINGS) IMPLANT
SUT MNCRL AB 4-0 PS2 18 (SUTURE) IMPLANT
SUT VIC AB 0 CT1 27 (SUTURE) ×1
SUT VIC AB 0 CT1 27XBRD ANTBC (SUTURE) ×1 IMPLANT
SUT VIC AB 1 CT1 36 (SUTURE) ×2 IMPLANT
SUT VIC AB 2-0 CT1 27 (SUTURE) ×2
SUT VIC AB 2-0 CT1 TAPERPNT 27 (SUTURE) ×2 IMPLANT
TRAY FOLEY MTR SLVR 16FR STAT (SET/KITS/TRAYS/PACK) IMPLANT
WATER STERILE IRR 1000ML POUR (IV SOLUTION) ×2 IMPLANT

## 2022-07-13 NOTE — Anesthesia Postprocedure Evaluation (Signed)
Anesthesia Post Note  Patient: Victoria [name]  Procedure(s) Performed: Ladona Horns KNEE ARTHROPLASTY (Right: Knee)     Patient location during evaluation: PACU Anesthesia Type: General Level of consciousness: awake and alert Pain management: pain level controlled Vital Signs Assessment: post-procedure vital signs reviewed and stable Respiratory status: spontaneous breathing, nonlabored ventilation, respiratory function stable and patient connected to nasal cannula oxygen Cardiovascular status: blood pressure returned to baseline and stable Postop Assessment: no apparent nausea or vomiting Anesthetic complications: no  No notable events documented.  Last Vitals:  Vitals:   07/13/22 1615 07/13/22 1633  BP: (!) 151/90 (!) 158/97  Pulse: 74 73  Resp: 12 14  Temp: 36.6 C 36.6 C  SpO2: 100% 100%    Last Pain:  Vitals:   07/13/22 1646  TempSrc:   PainSc: 10-Worst pain ever                 Kennieth Rad

## 2022-07-13 NOTE — Discharge Instructions (Signed)
Information on my medicine - ELIQUIS® (apixaban) ° °This medication education was reviewed with me or my healthcare representative as part of my discharge preparation.  The pharmacist that spoke with me during my hospital stay was:  Robertson, Crystal Stillinger, RPH ° °Why was Eliquis® prescribed for you? °Eliquis® was prescribed for you to reduce the risk of blood clots forming after orthopedic surgery.   ° °What do You need to know about Eliquis®? °Take your Eliquis® TWICE DAILY - one tablet in the morning and one tablet in the evening with or without food.  It would be best to take the dose about the same time each day. ° °If you have difficulty swallowing the tablet whole please discuss with your pharmacist how to take the medication safely. ° °Take Eliquis® exactly as prescribed by your doctor and DO NOT stop taking Eliquis® without talking to the doctor who prescribed the medication.  Stopping without other medication to take the place of Eliquis® may increase your risk of developing a clot. ° °After discharge, you should have regular check-up appointments with your healthcare provider that is prescribing your Eliquis®. ° °What do you do if you miss a dose? °If a dose of ELIQUIS® is not taken at the scheduled time, take it as soon as possible on the same day and twice-daily administration should be resumed.  The dose should not be doubled to make up for a missed dose.  Do not take more than one tablet of ELIQUIS at the same time. ° °Important Safety Information °A possible side effect of Eliquis® is bleeding. You should call your healthcare provider right away if you experience any of the following: °? Bleeding from an injury or your nose that does not stop. °? Unusual colored urine (red or dark [place]) or unusual colored stools (red or black). °? Unusual bruising for unknown reasons. °? A serious fall or if you hit your head (even if there is no bleeding). ° °Some medicines may interact with Eliquis® and  might increase your risk of bleeding or clotting while on Eliquis®. To help avoid this, consult your healthcare provider or pharmacist prior to using any new prescription or non-prescription medications, including herbals, vitamins, non-steroidal anti-inflammatory drugs (NSAIDs) and supplements. ° °This website has more information on Eliquis® (apixaban): http://[email or web address].com/eliquis/home ° °

## 2022-07-13 NOTE — Op Note (Signed)
Operative Note  Date of operation: 07/13/2022 Preoperative diagnosis: Right knee primary osteoarthritis Postoperative diagnosis: Same  Procedure: Right cemented total knee arthroplasty  Implants: Implant Name Type Inv. Item Serial No. Manufacturer Lot No. LRB No. Used Action  CEMENT HV SMART SET - [id number] Cement CEMENT HV SMART SET  [employer] [id number] Right 2 Implanted  INSERT TIB ATTUNE KNEE 5 8 RT - [id number] Insert INSERT TIB ATTUNE KNEE 5 8 RT  [employer] [id number] Right 1 Implanted  PATELLA MEDIAL ATTUN KNEE - [id number] Knees PATELLA MEDIAL ATTUN KNEE  [employer]PU[employer] [id number] Right 1 Implanted  COMP FEM CMT ATTUNE KNEE 5 RT - [id number] Joint COMP FEM CMT ATTUNE KNEE 5 RT  [employer] [id number] Right 1 Implanted  BASEPLATE TIB CMT FB PCKT SZ6 - [id number] Knees BASEPLATE TIB CMT FB PCKT SZ6  [employer] [id number] Right 1 Implanted   Surgeon: Vanita Panda. Magnus Ivan, MD Assistant: Rexene Edison, PA-C  Anesthesia: #1 right lower extremity adductor canal block, #2 General, #3 local Tourniquet time: Under 1-1/2 hours Antibiotics: 3 g IV Ancef Complications: None  Indications: The patient is a 62 year old female with debilitating arthritis involving her right knee.  She has tried and failed conservative treatment for many years now.  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  At this point her right knee pain is daily and she has failed conservative treatment.  It is detrimentally affecting her mobility, her quality of life and her actives daily to the point she wished to proceed with a total knee arthroplasty on the right side.  Having had this before or other joint she is fully aware of the risk of acute blood loss anemia, nerve or vessel injury, fracture, infection, DVT, implant failure, and wound healing issues.  She understands her goals are hopefully decrease pain, improve mobility, and improve quality of  life.  Procedure description: After informed consent was obtained and the appropriate right knee was marked anesthesia obtained a right lower extremity adductor canal block in the holding room.  The patient was then brought to the operating room and supine on the operative table.  General anesthesia was obtained.  A nonsterile tourniquet is placed around her upper right thigh and her right thigh, knee, leg and ankle were prepped and draped with DuraPrep and sterile drapes.  A timeout was called and she was identified as the correct patient the correct right knee.  An Esmarch was then used to wrap out the leg and the tourniquet was inflated to 300 mm of pressure.  With the knee extended a direct midline incision was made over the patella and carried proximally distally.  Dissection was carried down the knee joint and a medial parapatellar arthrotomy was made finding a moderate joint effusion.  With the knee in a flexed position we removed the medial lateral meniscus as well as the ACL.  Osteophytes were removed from all 3 compartments.  We then used an extramedullary cutting guide for making her proximal tibia cut correction varus and valgus and a neutral slope which was back down to 3 degrees.  We made our cut to take 2 mm off the low side and we then backed this down to more millimeters.  We then went to the femur and used a intramedullary cutting guide for the femur setting this for a right knee at 5 degrees externally rotated for a 9 mm distal femoral cut.  We made that cut without difficulty and  brought the knee back down to full extension and achieve full extension with their extension block.  We then went back to the femur and chose a femoral sizing guide based off the epicondylar axis.  Based off of this we chose a size 5 femur.  We put a 4-in-1 cutting block for a size 5 femur and made her anterior posterior cuts followed our chamfer cuts.  We then backed the tibia and chose a size 6 tibial tray setting  the rotation off the tibial tubercle and the femur.  With a size 6 tray secured we did our drill punch and keel hole off of this.  We then trialed our size 6 tibial tray with our size 5 right CR femur.  We placed a 5 mm fixed-bearing medial congruent polythene insert and went up to an 8 mm thickness insert.  We are pleased with stability and range of motion of that insert.  We then made our patella cut and drilled 3 holes for size 35 down patella.  With all transportation the I was pleased with range of motion and stability.  We then removed all transportation of the knee.  We mixed our cement and then cemented our DePuy attune size 6 tibial tray which was fixed-bearing.  We cemented our size 5 right CR femur.  We placed our 8 mm medial stabilized right polythene insert and then cemented our size 35 patella.  We then held the knee fully extended and compressed while the cement hardened.  Once that it hardened we put the knee through several cycles of motion we are pleased again with range of motion and stability.  The tourniquet was let down and hemostasis was obtained with electrocautery.  The arthrotomy was then closed with interrupted #1 Vicryl suture followed by 0 Vicryl close the deep tissue and 2-0 Vicryl to close subcutaneous tissue.  The skin was closed with staples.  Well-padded sterile dressing was applied.  The patient was awakened, extubated and taken recovery in stable addition.  Rexene Edison, PA-C did assist during the entire case and beginning to end and his assistance was crucial medically necessary for soft tissue management and retraction, helping guide implant placement and a layered closure of the wound.

## 2022-07-13 NOTE — Anesthesia Procedure Notes (Signed)
Anesthesia Regional Block: Adductor canal block   Pre-Anesthetic Checklist: , timeout performed,  Correct Patient, Correct Site, Correct Laterality,  Correct Procedure, Correct Position, site marked,  Risks and benefits discussed,  Surgical consent,  Pre-op evaluation,  At surgeon's request and post-op pain management  Laterality: Right  Prep: chloraprep       Needles:  Injection technique: Single-shot  Needle Type: Echogenic Needle     Needle Length: 9cm  Needle Gauge: 21     Additional Needles:   Procedures:,,,, ultrasound used (permanent image in chart),,    Narrative:  Start time: 07/13/2022 11:35 AM End time: 07/13/2022 11:41 AM Injection made incrementally with aspirations every 5 mL.  Performed by: Personally  Anesthesiologist: Marcene Duos, MD

## 2022-07-13 NOTE — Anesthesia Procedure Notes (Signed)
Procedure Name: Intubation Date/Time: 07/13/2022 12:20 PM  Performed by: Elisabeth Cara, CRNAPre-anesthesia Checklist: Patient identified, Emergency Drugs available, Suction available, Patient being monitored and Timeout performed Patient Re-evaluated:Patient Re-evaluated prior to induction Oxygen Delivery Method: Circle system utilized Preoxygenation: Pre-oxygenation with 100% oxygen Induction Type: IV induction Ventilation: Mask ventilation without difficulty Laryngoscope Size: Mac and 4 Grade View: Grade I Tube type: Oral Tube size: 7.0 mm Number of attempts: 1 Airway Equipment and Method: Stylet Placement Confirmation: ETT inserted through vocal cords under direct vision, positive ETCO2 and breath sounds checked- equal and bilateral Secured at: 21 cm Tube secured with: Tape Dental Injury: Teeth and Oropharynx as per pre-operative assessment

## 2022-07-13 NOTE — Transfer of Care (Signed)
Immediate Anesthesia Transfer of Care Note  Patient: Victoria [name]  Procedure(s) Performed: Ladona Horns KNEE ARTHROPLASTY (Right: Knee)  Patient Location: PACU  Anesthesia Type:GA combined with regional for post-op pain  Level of Consciousness: awake, alert , oriented, and patient cooperative  Airway & Oxygen Therapy: Patient Spontanous Breathing and Patient connected to face mask oxygen  Post-op Assessment: Report given to RN and Post -op Vital signs reviewed and stable  Post vital signs: Reviewed and stable  Last Vitals:  Vitals Value Taken Time  BP 151/96 07/13/22 1450  Temp 36.9 C 07/13/22 1450  Pulse 69 07/13/22 1456  Resp 14 07/13/22 1456  SpO2 99 % 07/13/22 1456  Vitals shown include unvalidated device data.  Last Pain:  Vitals:   07/13/22 1450  TempSrc:   PainSc: Asleep         Complications: No notable events documented.

## 2022-07-13 NOTE — Progress Notes (Signed)
PT Cancellation Note  Patient Details Name: Gisella Steinmiller MRN: [id number] DOB: 03/23/1960   Cancelled Treatment:    Reason Eval/Treat Not Completed: Medical issues which prohibited therapy  Pt with surgery end time of 1457 with general anesthesia and block.  Pt groggy/lethargic but also significant pain and foot still with decreased sensation in R LE.  Will f/u later date. Anise Salvo, PT Acute Rehab [facility] [phone]  Rayetta Humphrey 07/13/2022, 4:46 PM

## 2022-07-13 NOTE — Interval H&P Note (Signed)
History and Physical Interval Note: The patient is here today for right total knee replacement to treat her severe right knee arthritis.  There is been no acute or interval change in medical status.  See H&P.  The risks and benefits of surgery been explained in detail and informed consent is obtained.  The right operative knee has been marked.  07/13/2022 10:42 AM  Victoria [name]  has presented today for surgery, with the diagnosis of OSTEOARTHRITIS / DEGENERATIVE JOINT DISEASE RIGHT KNEE.  The various methods of treatment have been discussed with the patient and family. After consideration of risks, benefits and other options for treatment, the patient has consented to  Procedure(s): RIGHTTOTAL KNEE ARTHROPLASTY (Right) as a surgical intervention.  The patient's history has been reviewed, patient examined, no change in status, stable for surgery.  I have reviewed the patient's chart and labs.  Questions were answered to the patient's satisfaction.     Kathryne Hitch

## 2022-07-14 ENCOUNTER — Other Ambulatory Visit (HOSPITAL_COMMUNITY): Payer: Self-pay

## 2022-07-14 ENCOUNTER — Encounter: Payer: Self-pay | Admitting: Family

## 2022-07-14 DIAGNOSIS — Z9841 Cataract extraction status, right eye: Secondary | ICD-10-CM | POA: Diagnosis not present

## 2022-07-14 DIAGNOSIS — E890 Postprocedural hypothyroidism: Secondary | ICD-10-CM | POA: Diagnosis not present

## 2022-07-14 DIAGNOSIS — Z841 Family history of disorders of kidney and ureter: Secondary | ICD-10-CM | POA: Diagnosis not present

## 2022-07-14 DIAGNOSIS — Z9884 Bariatric surgery status: Secondary | ICD-10-CM | POA: Diagnosis not present

## 2022-07-14 DIAGNOSIS — Z8619 Personal history of other infectious and parasitic diseases: Secondary | ICD-10-CM | POA: Diagnosis not present

## 2022-07-14 DIAGNOSIS — Z9049 Acquired absence of other specified parts of digestive tract: Secondary | ICD-10-CM | POA: Diagnosis not present

## 2022-07-14 DIAGNOSIS — Z86010 Personal history of colonic polyps: Secondary | ICD-10-CM | POA: Diagnosis not present

## 2022-07-14 DIAGNOSIS — Z888 Allergy status to other drugs, medicaments and biological substances status: Secondary | ICD-10-CM | POA: Diagnosis not present

## 2022-07-14 DIAGNOSIS — Z96652 Presence of left artificial knee joint: Secondary | ICD-10-CM | POA: Diagnosis present

## 2022-07-14 DIAGNOSIS — I129 Hypertensive chronic kidney disease with stage 1 through stage 4 chronic kidney disease, or unspecified chronic kidney disease: Secondary | ICD-10-CM | POA: Diagnosis not present

## 2022-07-14 DIAGNOSIS — E0590 Thyrotoxicosis, unspecified without thyrotoxic crisis or storm: Secondary | ICD-10-CM | POA: Diagnosis not present

## 2022-07-14 DIAGNOSIS — Z96643 Presence of artificial hip joint, bilateral: Secondary | ICD-10-CM | POA: Diagnosis present

## 2022-07-14 DIAGNOSIS — R7303 Prediabetes: Secondary | ICD-10-CM | POA: Diagnosis present

## 2022-07-14 DIAGNOSIS — Z79899 Other long term (current) drug therapy: Secondary | ICD-10-CM | POA: Diagnosis not present

## 2022-07-14 DIAGNOSIS — Z79890 Hormone replacement therapy (postmenopausal): Secondary | ICD-10-CM | POA: Diagnosis not present

## 2022-07-14 DIAGNOSIS — Z8249 Family history of ischemic heart disease and other diseases of the circulatory system: Secondary | ICD-10-CM | POA: Diagnosis not present

## 2022-07-14 DIAGNOSIS — Z8349 Family history of other endocrine, nutritional and metabolic diseases: Secondary | ICD-10-CM | POA: Diagnosis not present

## 2022-07-14 DIAGNOSIS — Z83438 Family history of other disorder of lipoprotein metabolism and other lipidemia: Secondary | ICD-10-CM | POA: Diagnosis not present

## 2022-07-14 DIAGNOSIS — Z961 Presence of intraocular lens: Secondary | ICD-10-CM | POA: Diagnosis present

## 2022-07-14 DIAGNOSIS — Z9842 Cataract extraction status, left eye: Secondary | ICD-10-CM | POA: Diagnosis not present

## 2022-07-14 DIAGNOSIS — G4733 Obstructive sleep apnea (adult) (pediatric): Secondary | ICD-10-CM | POA: Diagnosis present

## 2022-07-14 DIAGNOSIS — M1711 Unilateral primary osteoarthritis, right knee: Secondary | ICD-10-CM | POA: Diagnosis not present

## 2022-07-14 DIAGNOSIS — Z6841 Body Mass Index (BMI) 40.0 and over, adult: Secondary | ICD-10-CM | POA: Diagnosis not present

## 2022-07-14 LAB — BASIC METABOLIC PANEL
Anion gap: 8 (ref 5–15)
BUN: 14 mg/dL (ref 8–23)
CO2: 22 mmol/L (ref 22–32)
Calcium: 8 mg/dL — ABNORMAL LOW (ref 8.9–10.3)
Chloride: 105 mmol/L (ref 98–111)
Creatinine, Ser: 1.13 mg/dL — ABNORMAL HIGH (ref 0.44–1.00)
GFR, Estimated: 55 mL/min — ABNORMAL LOW (ref >60)
Glucose, Bld: 211 mg/dL — ABNORMAL HIGH (ref 70–99)
Potassium: 4.4 mmol/L (ref 3.5–5.1)
Sodium: 135 mmol/L (ref 135–145)

## 2022-07-14 LAB — CBC
HCT: 33.9 % — ABNORMAL LOW (ref 36.0–46.0)
Hemoglobin: 10.8 g/dL — ABNORMAL LOW (ref 12.0–15.0)
MCH: 32.7 pg (ref 26.0–34.0)
MCHC: 31.9 g/dL (ref 30.0–36.0)
MCV: 102.7 fL — ABNORMAL HIGH (ref 80.0–100.0)
Platelets: 189 10*3/uL (ref 150–400)
RBC: 3.3 MIL/uL — ABNORMAL LOW (ref 3.87–5.11)
RDW: 14 % (ref 11.5–15.5)
WBC: 8.5 10*3/uL (ref 4.0–10.5)
nRBC: 0 % (ref 0.0–0.2)

## 2022-07-14 MED ORDER — APIXABAN 2.5 MG PO TABS
2.5000 mg | ORAL_TABLET | Freq: Two times a day (BID) | ORAL | 0 refills | Status: DC
  Filled 2022-07-14 (×2): qty 30, 15d supply, fill #0

## 2022-07-14 MED ORDER — OXYCODONE HCL 5 MG PO TABS
5.0000 mg | ORAL_TABLET | ORAL | 0 refills | Status: DC | PRN
  Filled 2022-07-14 (×2): qty 30, 3d supply, fill #0

## 2022-07-14 NOTE — Evaluation (Signed)
Physical Therapy Evaluation Patient Details Name: Victoria [name] MRN: [id number] DOB: [birth year] Today's Date: 07/14/2022  History of Present Illness  62 y.o. female admitted 07/13/22 for R TKA. PMH includes: obesity, gastric bypass, L THA revision, L TKA, OSA, spinal stenosis.  Clinical Impression  Pt admitted with above diagnosis. R knee buckled in standing, so KI was applied. Pt was able to ambulate 6' with RW, distance limited by fatigue and "giving away" of R knee with KI donned. Initiated TKA HEP, reviewed knee precautions of no pillow under knee.  Pt currently with functional limitations due to the deficits listed below (see PT Problem List). Pt will benefit from acute skilled PT to increase their independence and safety with mobility to allow discharge.          Recommendations for follow up therapy are one component of a multi-disciplinary discharge planning process, led by the attending physician.  Recommendations may be updated based on patient status, additional functional criteria and insurance authorization.  Follow Up Recommendations       Assistance Recommended at Discharge Intermittent Supervision/Assistance  Patient can return home with the following  A little help with walking and/or transfers;A little help with bathing/dressing/bathroom;Assist for transportation;A lot of help with bathing/dressing/bathroom;Help with stairs or ramp for entrance    Equipment Recommendations None recommended by PT  Recommendations for Other Services       Functional Status Assessment Patient has had a recent decline in their functional status and demonstrates the ability to make significant improvements in function in a reasonable and predictable amount of time.     Precautions / Restrictions Precautions Precautions: Knee Precaution Booklet Issued: Yes (comment) Precaution Comments: reviewed no pillow under knee Restrictions Weight Bearing Restrictions: No RLE Weight  Bearing: Weight bearing as tolerated      Mobility  Bed Mobility Overal bed mobility: Needs Assistance Bed Mobility: Supine to Sit     Supine to sit: Min assist, HOB elevated     General bed mobility comments: gait belt used as leg lifter HOB up, used rail, increased time, assist to advance RLE    Transfers Overall transfer level: Needs assistance Equipment used: Rolling walker (2 wheels) Transfers: Sit to/from Stand Sit to Stand: Min assist, From elevated surface           General transfer comment: VCs hand placement, sit to stand x 3, increased time/effort, min A to power up    Ambulation/Gait Ambulation/Gait assistance: Min guard Gait Distance (Feet): 6 Feet Assistive device: Rolling walker (2 wheels) Gait Pattern/deviations: Step-to pattern, Decreased step length - right, Decreased step length - left Gait velocity: decr     General Gait Details: VCs sequencing, KI donned 2* buckling in standing, distance limited 2* buckling/fatigue, increased time/effort  Stairs            Wheelchair Mobility    Modified Rankin (Stroke Patients Only)       Balance Overall balance assessment: Needs assistance Sitting-balance support: Feet supported, No upper extremity supported Sitting balance-Leahy Scale: Good     Standing balance support: Bilateral upper extremity supported, During functional activity, Reliant on assistive device for balance Standing balance-Leahy Scale: Poor                               Pertinent Vitals/Pain Pain Assessment Pain Assessment: 0-10 Pain Score: 6  Pain Location: R knee Pain Descriptors / Indicators: Sore Pain Intervention(s): Limited activity within patient's tolerance,  Monitored during session, Premedicated before session    Home Living Family/patient expects to be discharged to:: Private residence Living Arrangements: Spouse/significant other;Other relatives   Type of Home: House Home Access: Stairs to  enter Entrance Stairs-Rails: Can reach both Entrance Stairs-Number of Steps: 5 Alternate Level Stairs-Number of Steps: flight, has stair lift Home Layout: Two level Home Equipment: Agricultural consultant (2 wheels);Cane - quad Additional Comments: works as a Engineer, civil (consulting) at [employer]    Prior Function Prior Level of Function : Independent/Modified Independent;Driving             Mobility Comments: used RW or quad cane prior to surgery, denies falls in past 6 months ADLs Comments: independent     Hand Dominance        Extremity/Trunk Assessment   Upper Extremity Assessment Upper Extremity Assessment: Overall WFL for tasks assessed    Lower Extremity Assessment Lower Extremity Assessment: RLE deficits/detail RLE Deficits / Details: SLR +2/5, knee AAROM 0-35* limited by pain; knee buckled in standing so applied KI RLE Sensation: WNL    Cervical / Trunk Assessment Cervical / Trunk Assessment: Normal  Communication   Communication: No difficulties  Cognition Arousal/Alertness: Awake/alert Behavior During Therapy: WFL for tasks assessed/performed Overall Cognitive Status: Within Functional Limits for tasks assessed                                          General Comments      Exercises Total Joint Exercises Ankle Circles/Pumps: AROM, Both, 10 reps, Supine Quad Sets: AROM, Both, 5 reps, Supine Heel Slides: AAROM, Right, 10 reps, Supine   Assessment/Plan    PT Assessment Patient needs continued PT services  PT Problem List Decreased range of motion;Decreased strength;Decreased activity tolerance;Pain;Decreased balance;Decreased mobility       PT Treatment Interventions DME instruction;Functional mobility training;Patient/family education;Balance training;Gait training;Therapeutic activities;Therapeutic exercise    PT Goals (Current goals can be found in the Care Plan section)  Acute Rehab PT Goals 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 at a park, retire from working  as a Engineer, civil (consulting) PT Goal Formulation: With patient Time For Goal Achievement: 07/21/22 Potential to Achieve Goals: Good    Frequency 7X/week     Co-evaluation               AM-PAC PT "6 Clicks" Mobility  Outcome Measure Help needed turning from your back to your side while in a flat bed without using bedrails?: A Little Help needed moving from lying on your back to sitting on the side of a flat bed without using bedrails?: A Little Help needed moving to and from a bed to a chair (including a wheelchair)?: A Little Help needed standing up from a chair using your arms (e.g., wheelchair or bedside chair)?: A Little Help needed to walk in hospital room?: A Little Help needed climbing 3-5 steps with a railing? : Total 6 Click Score: 16    End of Session Equipment Utilized During Treatment: Gait belt;Right knee immobilizer Activity Tolerance: Patient limited by fatigue Patient left: in chair;with chair alarm set;with call bell/phone within reach Nurse Communication: Mobility status PT Visit Diagnosis: Muscle weakness (generalized) (M62.81);Difficulty in walking, not elsewhere classified (R26.2);Pain;Other abnormalities of gait and mobility (R26.89) Pain - Right/Left: Right Pain - part of body: Knee    Time: 0922-1002 PT Time Calculation (min) (ACUTE ONLY): 40 min   Charges:   PT Evaluation $PT  Eval Moderate Complexity: 1 Mod PT Treatments $Gait Training: 8-22 mins $Therapeutic Exercise: 8-22 mins        Ralene Bathe Kistler PT 07/14/2022  Acute Rehabilitation Services  Office [phone]

## 2022-07-14 NOTE — TOC Transition Note (Signed)
Transition of Care [facility]) - CM/SW Discharge Note  Patient Details  Name: Victoria [name] MRN: [id number] Date of Birth: 1960/09/11  Transition of Care [facility]) CM/SW Contact:  Ewing Schlein, LCSW Phone Number: 07/14/2022, 11:29 AM  Clinical Narrative: Patient is expected to discharge home after passing with PT. CSW met with patient to review discharge plan and needs. Patient has a rolling walker at home, so there are no DME needs at this time. Patient will need HHPT and the referral was not accepted by [facility]. CSW made referrals to the following agencies:  [facility]: no response [facility]: no response [facility]: no response [facility]: no response [facility]: out of network [facility]: declined [facility]: out of [facility]: accepted  CSW updated patient regarding [facility] accepting the referral. [facility] to get authorization from insurance for HHPT. TOC signing off.  Final next level of care: Home w Home Health Services Barriers to Discharge: No Barriers Identified  Patient Goals and CMS Choice CMS Medicare.gov Compare Post Acute Care list provided to:: Patient Choice offered to / list presented to : Patient  Discharge Plan and Services Additional resources added to the After Visit Summary for        DME Arranged: N/A DME Agency: NA HH Arranged: PT HH Agency: [facility] Date [facility] Agency Contacted: 07/14/22 Time HH Agency Contacted: 1114 Representative spoke with at [facility] Agency: Cindie  Social Determinants of Health (SDOH) Interventions SDOH Screenings   Food Insecurity: No Food Insecurity (07/13/2022)  Housing: Low Risk  (07/13/2022)  Transportation Needs: No Transportation Needs (07/13/2022)  Utilities: Not At Risk (07/13/2022)  Depression (PHQ2-9): Low Risk  (07/09/2022)  Recent Concern: Depression (PHQ2-9) - Medium Risk (05/18/2022)  Tobacco Use: Low Risk  (07/13/2022)   Readmission Risk Interventions     No data to display

## 2022-07-14 NOTE — Progress Notes (Signed)
Subjective: 1 Day Post-Op Procedure(s) (LRB): RIGHTTOTAL KNEE ARTHROPLASTY (Right) Patient reports pain as moderate.  The patient has slow mobility with her therapy today.  Objective: Vital signs in last 24 hours: Temp:  [97.4 F (36.3 C)-99.5 F (37.5 C)] 99.5 F (37.5 C) (05/18 0833) Pulse Rate:  [49-82] 75 (05/18 0833) Resp:  [9-18] 18 (05/18 0833) BP: (84-165)/(54-103) 121/73 (05/18 0833) SpO2:  [91 %-100 %] 99 % (05/18 0833)  Intake/Output from previous day: 05/17 0701 - 05/18 0700 In: 3257.1 [P.O.:660; I.V.:2197.1; IV Piggyback:400] Out: 50 [Blood:50] Intake/Output this shift: Total I/O In: 360 [P.O.:360] Out: -   Recent Labs    07/14/22 0329  HGB 10.8*   Recent Labs    07/14/22 0329  WBC 8.5  RBC 3.30*  HCT 33.9*  PLT 189   Recent Labs    07/14/22 0329  NA 135  K 4.4  CL 105  CO2 22  BUN 14  CREATININE 1.13*  GLUCOSE 211*  CALCIUM 8.0*   No results for input(s): "LABPT", "INR" in the last 72 hours.  Sensation intact distally Intact pulses distally Dorsiflexion/Plantar flexion intact Incision: dressing C/D/I No cellulitis present Compartment soft   Assessment/Plan: 1 Day Post-Op Procedure(s) (LRB): RIGHTTOTAL KNEE ARTHROPLASTY (Right) Up with therapy Hopefully she will make progress with her therapy today and in later tomorrow for the potential to discharge to home tomorrow.  If the utilization review team feels that it is necessary to switch her to an inpatient admission, they have my permission to do this is a voice order.     Kathryne Hitch 07/14/2022, 11:59 AM

## 2022-07-14 NOTE — Progress Notes (Signed)
Physical Therapy Treatment Patient Details Name: Victoria [name] MRN: [id number] DOB: Oct 24, 1960 Today's Date: 07/14/2022   History of Present Illness 62 y.o. female admitted 07/13/22 for R TKA. PMH includes: obesity, gastric bypass, L THA revision, L TKA, OSA, spinal stenosis.    PT Comments    Pt ambulated 6' x 2, to bedside commode then to bed with verbal cues for sequencing and proximity to RW. Distance limited by "15/10" pain and by RLE feeling like it was "giving away" with knee immobilizer donned.  Pt quite fatigued at end of session.     Recommendations for follow up therapy are one component of a multi-disciplinary discharge planning process, led by the attending physician.  Recommendations may be updated based on patient status, additional functional criteria and insurance authorization.  Follow Up Recommendations       Assistance Recommended at Discharge Intermittent Supervision/Assistance  Patient can return home with the following A little help with walking and/or transfers;A little help with bathing/dressing/bathroom;Assist for transportation;A lot of help with bathing/dressing/bathroom;Help with stairs or ramp for entrance   Equipment Recommendations  None recommended by PT    Recommendations for Other Services       Precautions / Restrictions Precautions Precautions: Knee Precaution Booklet Issued: Yes (comment) Precaution Comments: reviewed no pillow under knee Restrictions Weight Bearing Restrictions: No RLE Weight Bearing: Weight bearing as tolerated     Mobility  Bed Mobility Overal bed mobility: Needs Assistance Bed Mobility: Sit to Supine     Supine to sit: Min assist, HOB elevated Sit to supine: Min assist   General bed mobility comments: min A for RLE into bed    Transfers Overall transfer level: Needs assistance Equipment used: Rolling walker (2 wheels) Transfers: Sit to/from Stand Sit to Stand: Min guard           General  transfer comment: VCs hand placement    Ambulation/Gait Ambulation/Gait assistance: Min Chemical engineer (Feet): 6 Feet Assistive device: Rolling walker (2 wheels) Gait Pattern/deviations: Step-to pattern, Decreased step length - right, Decreased step length - left Gait velocity: decr     General Gait Details: 6' x 2 with RW, distance limited by pain, pt ambulated to bedside commode, then to bed, distance limited by pain and by RLE "giving away"   Stairs             Wheelchair Mobility    Modified Rankin (Stroke Patients Only)       Balance Overall balance assessment: Needs assistance Sitting-balance support: Feet supported, No upper extremity supported Sitting balance-Leahy Scale: Good     Standing balance support: Bilateral upper extremity supported, During functional activity, Reliant on assistive device for balance Standing balance-Leahy Scale: Poor                              Cognition Arousal/Alertness: Awake/alert Behavior During Therapy: WFL for tasks assessed/performed Overall Cognitive Status: Within Functional Limits for tasks assessed                                          Exercises Total Joint Exercises Ankle Circles/Pumps: AROM, Both, 10 reps, Supine Quad Sets: AROM, Both, 5 reps, Supine Heel Slides: AAROM, Right, 10 reps, Supine    General Comments        Pertinent Vitals/Pain Pain Assessment Pain Assessment: 0-10 Pain Score:  10-Worst pain ever Pain Location: R knee Pain Descriptors / Indicators: Sore Pain Intervention(s): Limited activity within patient's tolerance, Monitored during session, Premedicated before session    Home Living                          Prior Function            PT Goals (current goals can now be found in the care plan section) Acute Rehab PT Goals 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 at a park, retire from working as a Engineer, civil (consulting) PT Goal Formulation: With patient Time For  Goal Achievement: 07/21/22 Potential to Achieve Goals: Good Progress towards PT goals: Progressing toward goals    Frequency    7X/week      PT Plan Current plan remains appropriate    Co-evaluation              AM-PAC PT "6 Clicks" Mobility   Outcome Measure  Help needed turning from your back to your side while in a flat bed without using bedrails?: A Little Help needed moving from lying on your back to sitting on the side of a flat bed without using bedrails?: A Little Help needed moving to and from a bed to a chair (including a wheelchair)?: A Little Help needed standing up from a chair using your arms (e.g., wheelchair or bedside chair)?: A Little Help needed to walk in hospital room?: A Little Help needed climbing 3-5 steps with a railing? : Total 6 Click Score: 16    End of Session Equipment Utilized During Treatment: Gait belt;Right knee immobilizer Activity Tolerance: Patient limited by fatigue Patient left: with call bell/phone within reach;in bed;with bed alarm set Nurse Communication: Mobility status PT Visit Diagnosis: Muscle weakness (generalized) (M62.81);Difficulty in walking, not elsewhere classified (R26.2);Pain;Other abnormalities of gait and mobility (R26.89) Pain - Right/Left: Right Pain - part of body: Knee     Time: 1610-9604 PT Time Calculation (min) (ACUTE ONLY): 20 min  Charges:  $Gait Training: 8-22 mins                    Ralene Bathe Kistler PT 07/14/2022  Acute Rehabilitation Services  Office [phone]

## 2022-07-15 NOTE — Progress Notes (Signed)
Patient ID: Victoria [name], female   DOB: [birth year], 62 y.o.   MRN: [id number] Patient is status post total knee arthroplasty.  She is  requesting IV Dilaudid due to increased pain.  Plan for therapy today possible discharge Monday.

## 2022-07-15 NOTE — Plan of Care (Signed)
  Problem: Clinical Measurements: Goal: Cardiovascular complication will be avoided Outcome: Progressing   Problem: Activity: Goal: Risk for activity intolerance will decrease Outcome: Progressing   Problem: Pain Managment: Goal: General experience of comfort will improve Outcome: Progressing   

## 2022-07-15 NOTE — Progress Notes (Addendum)
Physical Therapy Treatment Patient Details Name: Victoria [name] MRN: [id number] DOB: 08/21/1960 Today's Date: 07/15/2022   History of Present Illness 62 y.o. female admitted 07/13/22 for R TKA. PMH includes: obesity, gastric bypass, L THA revision, L TKA, OSA, spinal stenosis.    PT Comments    Pt seen for PT session after receiving IV Dilaudid with increased tolerance for activity despite still having 7/10 Right knee pain with mobility. Minimal tolerance for assisted R knee flexion in supine and unable to demonstrate SLR, therefor, KI applied prior to OOB activity. Pt able to transfer self to EOB with heavy reliance on bed rails. Sit to stand from bed and BSC with CGA. Improved tolerance for gait training with RW, CGA, 60'x1, 15'x1. Pt continues to struggle weight shifting to Right with heavy reliance on RW. No c/o dizziness or nausea. Will continue to progress next session.   Recommendations for follow up therapy are one component of a multi-disciplinary discharge planning process, led by the attending physician.  Recommendations may be updated based on patient status, additional functional criteria and insurance authorization.  Follow Up Recommendations       Assistance Recommended at Discharge Intermittent Supervision/Assistance  Patient can return home with the following A little help with walking and/or transfers;A little help with bathing/dressing/bathroom;Assist for transportation;A lot of help with bathing/dressing/bathroom;Help with stairs or ramp for entrance   Equipment Recommendations  None recommended by PT    Recommendations for Other Services       Precautions / Restrictions Precautions Precautions: Knee Precaution Booklet Issued: Yes (comment) Precaution Comments: reviewed no pillow under knee Required Braces or Orthoses: Knee Immobilizer - Right Knee Immobilizer - Right: Discontinue once straight leg raise with < 10 degree lag Restrictions Weight  Bearing Restrictions: No RLE Weight Bearing: Weight bearing as tolerated     Mobility  Bed Mobility Overal bed mobility: Needs Assistance Bed Mobility: Sit to Supine     Supine to sit: Modified independent (Device/Increase time), HOB elevated     General bed mobility comments:  (Increased time to complete)    Transfers Overall transfer level: Needs assistance Equipment used: Rolling walker (2 wheels) Transfers: Sit to/from Stand Sit to Stand: Min guard           General transfer comment:  (VC's for hand and R LE positioning)    Ambulation/Gait Ambulation/Gait assistance: Min guard Gait Distance (Feet): 60 Feet Assistive device: Rolling walker (2 wheels) Gait Pattern/deviations: Step-to pattern, Decreased step length - right, Decreased step length - left Gait velocity: decr     General Gait Details:  (60' x 1, 15' x 1 with RW)   Stairs             Wheelchair Mobility    Modified Rankin (Stroke Patients Only)       Balance Overall balance assessment: Needs assistance Sitting-balance support: Feet supported, No upper extremity supported Sitting balance-Leahy Scale: Good     Standing balance support: Bilateral upper extremity supported, During functional activity, Reliant on assistive device for balance Standing balance-Leahy Scale: Fair Standing balance comment:  (Heavy reliance on RW)                            Cognition Arousal/Alertness: Awake/alert Behavior During Therapy: WFL for tasks assessed/performed Overall Cognitive Status: Within Functional Limits for tasks assessed  General Comments:  (Pleasant and Cooperative)        Exercises Total Joint Exercises Ankle Circles/Pumps: AROM, Both, 10 reps, Supine Quad Sets: AROM, Both, 10 reps, Supine Gluteal Sets: AROM, Both, 10 reps, Supine Heel Slides: AAROM, Right, 10 reps, Supine (Very limited tolerance ~20 degrees) Straight Leg  Raises: AAROM, Right, 5 reps, Supine    General Comments General comments (skin integrity, edema, etc.): Pt educated on proper transfer technique and gait sequencing      Pertinent Vitals/Pain Pain Assessment Pain Assessment: 0-10 Pain Score: 7  Pain Location: R knee Pain Descriptors / Indicators: Sore Pain Intervention(s): Monitored during session, Premedicated before session (Pre medicated with IV Dilaudid)    Home Living                          Prior Function            PT Goals (current goals can now be found in the care plan section) Acute Rehab PT Goals 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 at a park, retire from working as a Engineer, civil (consulting) Progress towards PT goals: Progressing toward goals    Frequency    7X/week      PT Plan Current plan remains appropriate    Co-evaluation              AM-PAC PT "6 Clicks" Mobility   Outcome Measure  Help needed turning from your back to your side while in a flat bed without using bedrails?: A Little Help needed moving from lying on your back to sitting on the side of a flat bed without using bedrails?: A Little Help needed moving to and from a bed to a chair (including a wheelchair)?: A Little Help needed standing up from a chair using your arms (e.g., wheelchair or bedside chair)?: A Little Help needed to walk in hospital room?: A Little Help needed climbing 3-5 steps with a railing? : Total 6 Click Score: 16    End of Session Equipment Utilized During Treatment: Gait belt;Right knee immobilizer Activity Tolerance: Patient limited by pain Patient left: in chair;with call bell/phone within reach;with family/visitor present Nurse Communication: Mobility status PT Visit Diagnosis: Muscle weakness (generalized) (M62.81);Difficulty in walking, not elsewhere classified (R26.2);Pain;Other abnormalities of gait and mobility (R26.89) Pain - Right/Left: Right Pain - part of body: Knee     Time: 1610-9604 PT Time  Calculation (min) (ACUTE ONLY): 41 min  Charges:  $Gait Training: 8-22 mins $Therapeutic Exercise: 8-22 mins $Therapeutic Activity: 8-22 mins                    Zadie Cleverly, PTA  Jannet Askew 07/15/2022, 1:13 PM

## 2022-07-16 ENCOUNTER — Encounter (HOSPITAL_COMMUNITY): Payer: Self-pay | Admitting: Orthopaedic Surgery

## 2022-07-16 LAB — GLUCOSE, CAPILLARY: Glucose-Capillary: 251 mg/dL — ABNORMAL HIGH (ref 70–99)

## 2022-07-16 NOTE — Plan of Care (Signed)
Patient discharged via private vehicle with husband. Haydee Salter, RN 07/16/22 1:13 PM

## 2022-07-16 NOTE — Progress Notes (Signed)
Physical Therapy Treatment Patient Details Name: Victoria [name] MRN: [id number] DOB: 06-23-1960 Today's Date: 07/16/2022   History of Present Illness 62 y.o. female admitted 07/13/22 for R TKA. PMH includes: obesity, gastric bypass, L THA revision, L TKA, OSA, spinal stenosis.    PT Comments    Pt ambulated 3' +45' with RW, no buckling of RLE so did not use KI today. Reviewed HEP and completed stair training. Pt demonstrates good understanding of safe techniques for mobility. Good progress with ambulation distance. She is ready to DC home from a PT standpoint.    Recommendations for follow up therapy are one component of a multi-disciplinary discharge planning process, led by the attending physician.  Recommendations may be updated based on patient status, additional functional criteria and insurance authorization.  Follow Up Recommendations       Assistance Recommended at Discharge Intermittent Supervision/Assistance  Patient can return home with the following A little help with walking and/or transfers;A little help with bathing/dressing/bathroom;Assist for transportation;A lot of help with bathing/dressing/bathroom;Help with stairs or ramp for entrance   Equipment Recommendations  None recommended by PT    Recommendations for Other Services       Precautions / Restrictions Precautions Precautions: Knee Precaution Booklet Issued: Yes (comment) Precaution Comments: reviewed no pillow under knee Restrictions Weight Bearing Restrictions: No RLE Weight Bearing: Weight bearing as tolerated     Mobility  Bed Mobility               General bed mobility comments: up in recliner    Transfers Overall transfer level: Needs assistance Equipment used: Rolling walker (2 wheels) Transfers: Sit to/from Stand Sit to Stand: Supervision           General transfer comment: increased time/effort, good hand placement    Ambulation/Gait Ambulation/Gait assistance:  Supervision Gait Distance (Feet): 85 Feet Assistive device: Rolling walker (2 wheels) Gait Pattern/deviations: Step-to pattern, Decreased step length - right, Decreased step length - left Gait velocity: decr     General Gait Details: 23' +45' with seated rest, no buckling, pt did not wear KI   Stairs Stairs: Yes Stairs assistance: Min guard Stair Management: One rail Right, Sideways, Step to pattern Number of Stairs: 3 General stair comments: VCs sequencing, no loss of balance   Wheelchair Mobility    Modified Rankin (Stroke Patients Only)       Balance Overall balance assessment: Needs assistance Sitting-balance support: Feet supported, No upper extremity supported Sitting balance-Leahy Scale: Good     Standing balance support: Bilateral upper extremity supported, During functional activity, Reliant on assistive device for balance Standing balance-Leahy Scale: Poor                              Cognition Arousal/Alertness: Awake/alert Behavior During Therapy: WFL for tasks assessed/performed Overall Cognitive Status: Within Functional Limits for tasks assessed                                          Exercises Total Joint Exercises Ankle Circles/Pumps: AROM, Both, 10 reps, Supine Quad Sets: AROM, Both, Supine, 5 reps Short Arc Quad: AAROM, Right, 5 reps, Supine Heel Slides: AAROM, Right, 10 reps, Supine Hip ABduction/ADduction: AAROM, Right, 5 reps, Supine Straight Leg Raises: AAROM, Right, 5 reps, Supine Long Arc Quad: AAROM, Right, 5 reps, Seated Knee Flexion: AAROM, Right,  10 reps, Seated Goniometric ROM: 0-45* AAROM R knee    General Comments        Pertinent Vitals/Pain Pain Assessment Pain Score: 7  Pain Location: R knee Pain Descriptors / Indicators: Sore Pain Intervention(s): Limited activity within patient's tolerance, Monitored during session, Premedicated before session, Ice applied    Home Living                           Prior Function            PT Goals (current goals can now be found in the care plan section) Acute Rehab PT Goals 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 at a park, retire from working as a Engineer, civil (consulting) Time For Goal Achievement: 07/21/22 Potential to Achieve Goals: Good Progress towards PT goals: Progressing toward goals    Frequency    7X/week      PT Plan Current plan remains appropriate    Co-evaluation              AM-PAC PT "6 Clicks" Mobility   Outcome Measure  Help needed turning from your back to your side while in a flat bed without using bedrails?: A Little Help needed moving from lying on your back to sitting on the side of a flat bed without using bedrails?: A Little Help needed moving to and from a bed to a chair (including a wheelchair)?: None Help needed standing up from a chair using your arms (e.g., wheelchair or bedside chair)?: None Help needed to walk in hospital room?: None Help needed climbing 3-5 steps with a railing? : A Little 6 Click Score: 21    End of Session Equipment Utilized During Treatment: Gait belt Activity Tolerance: Patient limited by pain Patient left: in chair;with call bell/phone within reach Nurse Communication: Mobility status PT Visit Diagnosis: Muscle weakness (generalized) (M62.81);Difficulty in walking, not elsewhere classified (R26.2);Pain;Other abnormalities of gait and mobility (R26.89) Pain - Right/Left: Right Pain - part of body: Knee     Time: 0912-1010 PT Time Calculation (min) (ACUTE ONLY): 58 min  Charges:  $Gait Training: 23-37 mins $Therapeutic Exercise: 23-37 mins                     Ralene Bathe Kistler PT 07/16/2022  Acute Rehabilitation Services  Office [phone]

## 2022-07-16 NOTE — Plan of Care (Signed)
Problem: Education: Goal: Knowledge of the prescribed therapeutic regimen will improve Outcome: Progressing   Problem: Activity: Goal: Ability to avoid complications of mobility impairment will improve Outcome: Progressing   Problem: Clinical Measurements: Goal: Postoperative complications will be avoided or minimized Outcome: Progressing   Problem: Pain Management: Goal: Pain level will decrease with appropriate interventions Outcome: Progressing   Haydee Salter, RN 07/16/22 7:39 AM

## 2022-07-16 NOTE — Progress Notes (Deleted)
62 y.o. G2P0112 Married Black or [place] American Not Hispanic or Latino female here for annual exam.      No LMP recorded. Patient has had a hysterectomy.          Sexually active: {yes no:314532}  The current method of family planning is {contraception:315051}.    Exercising: {yes no:314532}  {types:19826} Smoker:  {YES [id number]  Health Maintenance: Pap: 08/14/2018 Neg 02/14/12 WNL History of abnormal Pap:  no MMG:  05/29/22 bi-rads 1 neg  BMD:   03/24/18 normal  Colonoscopy: 03/29/17 normal, f/u in 10 years  TDaP:  01/10/12 Gardasil: n/a   reports that she has never smoked. She has never used smokeless tobacco. She reports that she does not drink alcohol and does not use drugs.  Past Medical History:  Diagnosis Date   Anxiety    Back pain    Bursitis    right shoulder   Chronic kidney disease    hematuria, has been worked up, her normal   Chronic leukopenia    intermittant since 2010, followed by Dr. Lenord Fellers now   Chronic neck pain    Constipation    Degenerative joint disease of low back 09/02/2015   Dr. Despina Hick   Displacement of lumbar intervertebral disc    Dysrhythmia    Tachycardia, PVCs, PACs   Edema of both lower legs    Fibrocystic breast    Gallbladder problem    GERD (gastroesophageal reflux disease)    Headache    per pt more stress/tension   Heart palpitations    SEE EPIC ENCOUTNER , CARDIOLOGY DR. Gloris Manchester TURNER 2018; reports on 05-16-17 " i haven't had any bouts of those lately"     Hemorrhoids    Hidradenitis suppurativa    History of Clostridium difficile infection 12/2010   History of exercise intolerance    ETT on 04-27-2016-- negative ([facility] treadmill score 7)   History of Helicobacter pylori infection 2001 and 09/ 2012   HSV-2 infection    genital   Hx of adenomatous colonic polyps 10/17/2007   Hydradenitis    per pt currently treated with Humira injection   Hypertension    Hyperthyroidism    Hypocupremia    Hypothyroidism    Hypothyroidism,  postsurgical 1980   IBS (irritable bowel syndrome)    Insomnia    Iron deficiency    Joint pain    Leukopenia    Lumbosacral radiculopathy    Lymphocytic colitis 05/14/2022   dx from colonoscopy   Medial meniscus tear    right knee   Mild obstructive sleep apnea    Mitral regurgitation    Mild to moderate by echo 09/2021   Numbness and tingling of right arm    Obesity    Osteoarthritis    Palpitations    Pernicious anemia    b12 def   Peroneal neuropathy    PONV (postoperative nausea and vomiting)    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   Prediabetes    Reactive hypoglycemia followed by pcp   post gastrectomy dumping syndrome   SOB (shortness of breath)    Spinal stenosis    Tendonitis    right shoulder   Varicose veins    Vertigo    Vitamin B 12 deficiency    Vitamin D deficiency     Past Surgical History:  Procedure Laterality Date   BREATH TEK H PYLORI N/A 05/03/2014   Procedure: BREATH TEK H PYLORI;  Surgeon: Wenda Low, MD;  Location: [facility];  Service: General;  Laterality: N/A;   CARDIOVASCULAR STRESS TEST  03/11/2013   dr Gloris Manchester turner   Low risk nuclear study w/ a very small anterior perfusion defect that most likely represents breast attenuation artifact, less likely this could represent a true perfusion abnormality in the distribution of diagonal branch of the LAD/  normal LV function and wall motion , ef 66%   CATARACT EXTRACTION W/ INTRAOCULAR LENS  IMPLANT, BILATERAL  10/2017   CESAREAN SECTION  1985   CHOLECYSTECTOMY OPEN  1987   COLONOSCOPY  02/2017   Dr Loreta Ave ; per patient they found 7 polyps with polypectomy; on 5 year track    ESOPHAGOGASTRODUODENOSCOPY  02/01/2021   FINE NEEDLE ASPIRATION Left 05/22/2017   Procedure: LEFT KNEE ASPIRATION;  Surgeon: Ollen Gross, MD;  Location: [facility];  Service: Orthopedics;  Laterality: Left;   GASTRIC ROUX-EN-Y N/A 07/06/2014   Procedure: LAPAROSCOPIC ROUX-EN-Y GASTRIC BYPASS, ENTEROLYSIS OF ADHESIONS  WITH UPPER ENDOSCOPY;  Surgeon: Luretha Murphy, MD;  Location: [facility];  Service: General;  Laterality: N/A;   HAMMER TOE SURGERY Bilateral 02/17/2008   bilateral foot digit's 2 and 5   HIATAL HERNIA REPAIR N/A 07/06/2014   Procedure: LAPAROSCOPIC REPAIR OF HIATAL HERNIA;  Surgeon: Luretha Murphy, MD;  Location: [facility];  Service: General;  Laterality: N/A;   I & D KNEE WITH POLY EXCHANGE Left 12/11/2019   Procedure: LEFT KNEE POLY-LINER EXCHANGE;  Surgeon: Kathryne Hitch, MD;  Location: [facility];  Service: Orthopedics;  Laterality: Left;   KNEE ARTHROSCOPY Right 12/11/2019   Procedure: RIGHT KNEE ARTHROSCOPY WITH PARTIAL MEDIAL MENISCECTOMY;  Surgeon: Kathryne Hitch, MD;  Location: [facility];  Service: Orthopedics;  Laterality: Right;   KNEE ARTHROSCOPY WITH MEDIAL MENISECTOMY Left 07/17/2017   Procedure: LEFT KNEE ARTHROSCOPY WITH MEDIAL LATERAL MENISECTOMY;  Surgeon: Ollen Gross, MD;  Location: [facility];  Service: Orthopedics;  Laterality: Left;   MASS EXCISION N/A 10/11/2016   Procedure: EXCISION OF PERIANAL MASS;  Surgeon: Romie Levee, MD;  Location: [facility];  Service: General;  Laterality: N/A;   PARTIAL HYSTERECTOMY  2006   has ovaries   REFRACTIVE SURGERY     THYROIDECTOMY  1980   TOTAL ABDOMINAL HYSTERECTOMY  08-25-2002   dr Myrlene Broker   ovaries retained   TOTAL HIP ARTHROPLASTY Right 05/21/2012   Procedure: TOTAL HIP ARTHROPLASTY;  Surgeon: Loanne Drilling, MD;  Location: [facility];  Service: Orthopedics;  Laterality: Right;   TOTAL HIP ARTHROPLASTY Left 01-31-2009  dr Lequita Halt   TOTAL HIP REVISION Left 05/22/2017   Procedure: Left hip bearing surface and head revision;  Surgeon: Ollen Gross, MD;  Location: [facility];  Service: Orthopedics;  Laterality: Left;   TOTAL KNEE ARTHROPLASTY Left 01/20/2018   Procedure: LEFT TOTAL KNEE ARTHROPLASTY;  Surgeon: Ollen Gross, MD;  Location: [facility];  Service: Orthopedics;  Laterality: Left;     TRANSTHORACIC ECHOCARDIOGRAM  05/03/2016   ef 55-60%/  trivial MR/  mild TR   TUBAL LIGATION      No current facility-administered medications for this visit.   Current Outpatient Medications  Medication Sig Dispense Refill   apixaban (ELIQUIS) 2.5 MG TABS tablet Take 1 tablet (2.5 mg total) by mouth every 12 (twelve) hours. 30 tablet 0   oxyCODONE (OXY IR/ROXICODONE) 5 MG immediate release tablet Take 1-2 tablets (5-10 mg total) by mouth every 4 (four) hours as needed for moderate pain (pain score 4-6). 30 tablet 0   Facility-Administered Medications Ordered in Other  Visits  Medication Dose Route Frequency Provider Last Rate Last Admin   0.9 %  sodium chloride infusion   Intravenous Continuous Kathryne Hitch, MD 75 mL/hr at 07/16/22 0750 Infusion Verify at 07/16/22 0750   acetaminophen (TYLENOL) tablet 325-650 mg  325-650 mg Oral Q6H PRN Kathryne Hitch, MD   650 mg at 07/16/22 0435   albuterol (VENTOLIN HFA) 108 (90 Base) MCG/ACT inhaler 2 puff  2 puff Inhalation Q4H PRN Kathryne Hitch, MD       ALPRAZolam Prudy Feeler) tablet 0.5 mg  0.5 mg Oral TID PRN Kathryne Hitch, MD   0.5 mg at 07/15/22 2149   alum & mag hydroxide-simeth (MAALOX/MYLANTA) 200-200-20 MG/5ML suspension 30 mL  30 mL Oral Q4H PRN Kathryne Hitch, MD       apixaban Everlene Balls) tablet 2.5 mg  2.5 mg Oral Q12H Kathryne Hitch, MD   2.5 mg at 07/16/22 0816   budesonide (ENTOCORT EC) 24 hr capsule 9 mg  9 mg Oral Daily Kathryne Hitch, MD       diphenhydrAMINE (BENADRYL) 12.5 MG/5ML elixir 12.5-25 mg  12.5-25 mg Oral Q4H PRN Kathryne Hitch, MD   25 mg at 07/14/22 2340   docusate sodium (COLACE) capsule 100 mg  100 mg Oral BID Kathryne Hitch, MD   100 mg at 07/16/22 0816   escitalopram (LEXAPRO) tablet 10 mg  10 mg Oral Daily Kathryne Hitch, MD   10 mg at 07/16/22 0816   flecainide (TAMBOCOR) tablet 50 mg  50 mg Oral Q0600 Kathryne Hitch, MD    50 mg at 07/15/22 0557   gabapentin (NEURONTIN) capsule 600 mg  600 mg Oral QHS Kathryne Hitch, MD   600 mg at 07/15/22 2149   gadobenate dimeglumine (MULTIHANCE) injection 20 mL  20 mL Intravenous Once PRN Naomie Dean B, MD       HYDROmorphone (DILAUDID) injection 0.5-1 mg  0.5-1 mg Intravenous Q4H PRN Kathryne Hitch, MD   1 mg at 07/15/22 1624   levothyroxine (SYNTHROID) tablet 112 mcg  112 mcg Oral QAC breakfast Kathryne Hitch, MD   112 mcg at 07/16/22 0435   menthol-cetylpyridinium (CEPACOL) lozenge 3 mg  1 lozenge Oral PRN Kathryne Hitch, MD       Or   phenol (CHLORASEPTIC) mouth spray 1 spray  1 spray Mouth/Throat PRN Kathryne Hitch, MD       metoCLOPramide (REGLAN) tablet 5-10 mg  5-10 mg Oral Q8H PRN Kathryne Hitch, MD       Or   metoCLOPramide (REGLAN) injection 5-10 mg  5-10 mg Intravenous Q8H PRN Kathryne Hitch, MD       metoprolol succinate (TOPROL-XL) 24 hr tablet 25 mg  25 mg Oral Daily Kathryne Hitch, MD   25 mg at 07/14/22 1112   montelukast (SINGULAIR) tablet 10 mg  10 mg Oral QHS Kathryne Hitch, MD   10 mg at 07/15/22 2149   ondansetron (ZOFRAN) tablet 4 mg  4 mg Oral Q6H PRN Kathryne Hitch, MD       Or   ondansetron [facility]) injection 4 mg  4 mg Intravenous Q6H PRN Kathryne Hitch, MD   4 mg at 07/13/22 1650   oxyCODONE (Oxy IR/ROXICODONE) immediate release tablet 10-15 mg  10-15 mg Oral Q4H PRN Kathryne Hitch, MD   10 mg at 07/16/22 0435   oxyCODONE (Oxy IR/ROXICODONE) immediate release tablet 5-10 mg  5-10 mg Oral  Q4H PRN Kathryne Hitch, MD   10 mg at 07/16/22 0825   pantoprazole (PROTONIX) EC tablet 40 mg  40 mg Oral Daily Kathryne Hitch, MD   40 mg at 07/16/22 0816   polyethylene glycol (MIRALAX / GLYCOLAX) packet 17 g  17 g Oral Daily PRN Kathryne Hitch, MD       promethazine (PHENERGAN) tablet 25 mg  25 mg Oral Q8H PRN Kathryne Hitch, MD       tiZANidine (ZANAFLEX) tablet 4 mg  4 mg Oral QID PRN Kathryne Hitch, MD   4 mg at 07/15/22 1945   Vitamin D (Ergocalciferol) (DRISDOL) 1.25 MG (50000 UNIT) capsule 50,000 Units  50,000 Units Oral Once per day on Mon Thu Blackman, Christopher Y, MD   50,000 Units at 07/16/22 0815    Family History  Problem Relation Age of Onset   High blood pressure Mother    Hypertension Mother    Headache Mother    High Cholesterol Mother    Thyroid disease Mother    Obesity Mother    Kidney disease Mother    Hypertension Father    Diabetes Father    High blood pressure Father    High Cholesterol Father    Heart disease Father    Obesity Father    Diabetes Sister    Hypertension Sister    Thyroid disease Brother    Thyroid disease Maternal Aunt    Migraines Neg Hx     Review of Systems  Exam:   There were no vitals taken for this visit.  Weight change: @WEIGHTCHANGE @ Height:      Ht Readings from Last 3 Encounters:  07/13/22 5\' 7"  (1.702 m)  07/12/22 5\' 7"  (1.702 m)  07/09/22 5' 7.25" (1.708 m)    General appearance: alert, cooperative and appears stated age Head: Normocephalic, without obvious abnormality, atraumatic Neck: no adenopathy, supple, symmetrical, trachea midline and thyroid {CHL AMB PHY EX THYROID NORM DEFAULT:[phone]::"normal to inspection and palpation"} Lungs: clear to auscultation bilaterally Cardiovascular: regular rate and rhythm Breasts: {Exam; breast:13139::"normal appearance, no masses or tenderness"} Abdomen: soft, non-tender; non distended,  no masses,  no organomegaly Extremities: extremities normal, atraumatic, no cyanosis or edema Skin: Skin color, texture, turgor normal. No rashes or lesions Lymph nodes: Cervical, supraclavicular, and axillary nodes normal. No abnormal inguinal nodes palpated Neurologic: Grossly normal   Pelvic: External genitalia:  no lesions              Urethra:  normal appearing urethra with no  masses, tenderness or lesions              Bartholins and Skenes: normal                 Vagina: normal appearing vagina with normal color and discharge, no lesions              Cervix: {CHL AMB PHY EX CERVIX NORM [phone]::"no lesions"}               Bimanual Exam:  Uterus:  {CHL AMB PHY EX UTERUS NORM DEFAULT:[phone]::"normal size, contour, position, consistency, mobility, non-tender"}              Adnexa: {CHL AMB PHY EX ADNEXA NO MASS [phone]::"no mass, fullness, tenderness"}               Rectovaginal: Confirms               Anus:  normal sphincter tone, no lesions  ***  chaperoned for the exam.  A:  Well Woman with normal exam  P:

## 2022-07-16 NOTE — Progress Notes (Signed)
Subjective: 3 Days Post-Op Procedure(s) (LRB): RIGHTTOTAL KNEE ARTHROPLASTY (Right) Patient reports pain as moderate.  Working slowly with therapy, but hopes to go home today.  Objective: Vital signs in last 24 hours: Temp:  [97.9 F (36.6 C)-98.8 F (37.1 C)] 98.8 F (37.1 C) (05/20 0429) Pulse Rate:  [71-91] 89 (05/20 0429) Resp:  [16-17] 17 (05/20 0429) BP: (99-143)/(53-94) 136/77 (05/20 0429) SpO2:  [99 %-100 %] 100 % (05/20 0429)  Intake/Output from previous day: 05/19 0701 - 05/20 0700 In: 2312.2 [P.O.:960; I.V.:1352.2] Out: -  Intake/Output this shift: No intake/output data recorded.  Recent Labs    07/14/22 0329  HGB 10.8*   Recent Labs    07/14/22 0329  WBC 8.5  RBC 3.30*  HCT 33.9*  PLT 189   Recent Labs    07/14/22 0329  NA 135  K 4.4  CL 105  CO2 22  BUN 14  CREATININE 1.13*  GLUCOSE 211*  CALCIUM 8.0*   No results for input(s): "LABPT", "INR" in the last 72 hours.  Sensation intact distally Intact pulses distally Dorsiflexion/Plantar flexion intact Incision: dressing C/D/I No cellulitis present Compartment soft   Assessment/Plan: 3 Days Post-Op Procedure(s) (LRB): RIGHTTOTAL KNEE ARTHROPLASTY (Right) Up with therapy Discharge home with home health this afternoon      Kathryne Hitch 07/16/2022, 7:26 AM

## 2022-07-16 NOTE — Discharge Summary (Signed)
Patient ID: Victoria [name] MRN: [id number] DOB/AGE: Dec 12, 1960 62 y.o.  Admit date: 07/13/2022 Discharge date: 07/16/2022  Admission Diagnoses:  Principal Problem:   Status post total right knee replacement Active Problems:   Unilateral primary osteoarthritis, right knee   Discharge Diagnoses:  Same  Past Medical History:  Diagnosis Date   Anxiety    Back pain    Bursitis    right shoulder   Chronic kidney disease    hematuria, has been worked up, her normal   Chronic leukopenia    intermittant since 2010, followed by Dr. Lenord Fellers now   Chronic neck pain    Constipation    Degenerative joint disease of low back 09/02/2015   Dr. Despina Hick   Displacement of lumbar intervertebral disc    Dysrhythmia    Tachycardia, PVCs, PACs   Edema of both lower legs    Fibrocystic breast    Gallbladder problem    GERD (gastroesophageal reflux disease)    Headache    per pt more stress/tension   Heart palpitations    SEE EPIC ENCOUTNER , CARDIOLOGY DR. Gloris [place] TURNER 2018; reports on 05-16-17 " i haven't had any bouts of those lately"     Hemorrhoids    Hidradenitis suppurativa    History of Clostridium difficile infection 12/2010   History of exercise intolerance    ETT on 04-27-2016-- negative ([facility] treadmill score 7)   History of Helicobacter pylori infection 2001 and 09/ 2012   HSV-2 infection    genital   Hx of adenomatous colonic polyps 10/17/2007   Hydradenitis    per pt currently treated with Humira injection   Hypertension    Hyperthyroidism    Hypocupremia    Hypothyroidism    Hypothyroidism, postsurgical 1980   IBS (irritable bowel syndrome)    Insomnia    Iron deficiency    Joint pain    Leukopenia    Lumbosacral radiculopathy    Lymphocytic colitis 05/14/2022   dx from colonoscopy   Medial meniscus tear    right knee   Mild obstructive sleep apnea    Mitral regurgitation    Mild to moderate by echo 09/2021   Numbness and tingling of right arm     Obesity    Osteoarthritis    Palpitations    Pernicious anemia    b12 def   Peroneal neuropathy    PONV (postoperative nausea and vomiting)    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 female who was admitted 07/13/2022 for operative treatment ofStatus post total right knee replacement. Patient has severe unremitting pain that affects sleep, daily activities, and work/hobbies. After pre-op clearance the patient was taken to the operating room on 07/13/2022 and underwent  Procedure(s): RIGHTTOTAL KNEE ARTHROPLASTY.    Patient was given perioperative antibiotics:  Anti-infectives (From admission, onward)    Start     Dose/Rate Route Frequency Ordered Stop   07/13/22 1830  ceFAZolin (ANCEF) IVPB 2g/100 mL premix  2 g 200 mL/hr over 30 Minutes Intravenous Every 6 hours 07/13/22 1630 07/14/22 0025   07/13/22 0949  ceFAZolin (ANCEF) 2-4 GM/100ML-% IVPB  Status:  Discontinued       Note to Pharmacy: Richardean Sale M: cabinet override      07/13/22 0949 07/13/22 1011   07/13/22 0945  ceFAZolin (ANCEF) IVPB 3g/100 mL premix        3 g 200 mL/hr over 30 Minutes Intravenous On call to O.R. 07/13/22 0942 07/13/22 1251        Patient was given sequential compression devices, early ambulation, and chemoprophylaxis to prevent DVT.  Patient benefited maximally from hospital stay and there were no complications.    Recent vital signs: Patient Vitals for the past 24 hrs:  BP Temp Temp src Pulse Resp SpO2  07/16/22 1135 134/76 99.7 F (37.6 C) Oral 88 -- 99 %   07/16/22 0429 136/77 98.8 F (37.1 C) Oral 89 17 100 %  07/15/22 2357 (!) 99/53 97.9 F (36.6 C) Oral 71 16 99 %  07/15/22 1441 110/63 98.6 F (37 C) Oral 91 17 99 %     Recent laboratory studies:  Recent Labs    07/14/22 0329  WBC 8.5  HGB 10.8*  HCT 33.9*  PLT 189  NA 135  K 4.4  CL 105  CO2 22  BUN 14  CREATININE 1.13*  GLUCOSE 211*  CALCIUM 8.0*     Discharge Medications:   Allergies as of 07/16/2022       Reactions   Other Other (See Comments)   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   Estradiol Rash, Other (See Comments)   Local rash with estradiol patches        Medication List     TAKE these medications    albuterol 108 (90 Base) MCG/ACT inhaler Commonly known as: VENTOLIN HFA Inhale 2 puffs into the lungs every 4 hours as needed for wheezing or shortness of breath.   ALPRAZolam 0.5 MG tablet Commonly known as: XANAX Take 1 tablet (0.5 mg total) by mouth in the morning AND 1 tablet (0.5 mg total) daily at 12 noon AND 2 tablets (1 mg total) at bedtime.   budesonide 3 MG 24 hr capsule Commonly known as: ENTOCORT EC Take 3 capsules (9 mg total) by mouth in the morning.   Carbinoxamine Maleate 4 MG Tabs Take 1-2 tablets at night as needed for drainage and allergies. May cause drowsiness.   celecoxib 200 MG capsule Commonly known as: CELEBREX Take 1 capsule (200 mg total) by mouth 2 (two) times daily as needed.   clobetasol ointment 0.05 % Commonly known as: TEMOVATE Apply to skin twice daily as directed for up to 2 weeks as needed   cyanocobalamin 1000 MCG/ML injection Commonly known as: Dodex INJECT 1 ML INTRAMUSCULARLY EVERY MONTH   Eliquis 2.5 MG Tabs tablet Generic drug: apixaban Take 1 tablet (2.5 mg total) by mouth every 12 (twelve) hours.   Emgality 120 MG/ML Soaj Generic drug: Galcanezumab-gnlm Inject 120 mg into the skin every 30 (thirty) days.   escitalopram 10 MG tablet Commonly known as: Lexapro Take  1 tablet (10 mg total) by mouth daily.   Estradiol 0.75 MG/1.25 GM (0.06%) topical gel Place 1.25 g onto the skin daily. What changed: when to take this   flecainide 50 MG tablet Commonly known as: TAMBOCOR Take 1 tablet (50 mg total) by mouth daily at 6 (six) AM.   gabapentin 600 MG  tablet Commonly known as: NEURONTIN Take 1 tablet (600 mg total) by mouth 3 (three) times daily. What changed:  when to take this additional instructions   Humira (2 Pen) 40 MG/0.8ML Pnkt Generic drug: Adalimumab Inject 1 pen subcutaneously every week What changed: when to take this   ipratropium 0.03 % nasal spray Commonly known as: ATROVENT Use 1-2 sprays in each nostril up to 3 times daily as needed for runny nose. Can take 15 minutes prior to meals.   levocetirizine 5 MG tablet Commonly known as: XYZAL Take 1 tablet (5 mg total) by mouth every evening.   levothyroxine 112 MCG tablet Commonly known as: SYNTHROID Take 1 tablet (112 mcg total) by mouth daily.   metoprolol succinate 25 MG 24 hr tablet Commonly known as: TOPROL-XL Take 1 tablet (25 mg total) by mouth daily.   montelukast 10 MG tablet Commonly known as: Singulair Take 1 tablet (10 mg total) by mouth at bedtime.   Olopatadine HCl 0.2 % Soln Apply 1 drop to eye daily as needed. What changed: reasons to take this   oxyCODONE 5 MG immediate release tablet Commonly known as: Oxy IR/ROXICODONE Take 1-2 tablets (5-10 mg total) by mouth every 4 (four) hours as needed for moderate pain (pain score 4-6).   pantoprazole 40 MG tablet Commonly known as: PROTONIX Take 1 tablet by mouth daily 20 minutes before breakfast What changed:  how much to take when to take this reasons to take this   promethazine 25 MG tablet Commonly known as: PHENERGAN Take 1 tablet (25 mg total) by mouth every 8 (eight) hours as needed for N/V.   sulfamethoxazole-trimethoprim 800-160 MG tablet Commonly known as: Bactrim DS Take 1 tablet by mouth  2 (two) times daily as needed for flare   terconazole 0.4 % vaginal cream Commonly known as: TERAZOL 7 Place 1 applicatorful vaginally at bedtime for seven nights.   Then use one applicatorful once a week for 6 months.   tiZANidine 4 MG capsule Commonly known as: Zanaflex Take 1 capsule (4 mg total) by mouth 3 (three) times daily as needed for muscle spasms.   triamcinolone cream 0.1 % Commonly known as: KENALOG Apply 1 Application topically 2 (two) times daily. Use for 2 weeks, and then off for 2 weeks as needed.   Ubrelvy 100 MG Tabs Generic drug: Ubrogepant Take 1 tablet (100 mg total) by mouth every 2 (two) hours as needed. Maximum 200mg  a day.   Vitamin D (Ergocalciferol) 1.25 MG (50000 UNIT) Caps capsule Commonly known as: DRISDOL Take 1 capsule (50,000 Units total) by mouth 2 (two) times a week for Vitamin deficiency and gastric bypass.               Durable Medical Equipment  (From admission, onward)           Start     Ordered   07/13/22 1631  DME 3 n 1  Once        07/13/22 1630   07/13/22 1631  DME Walker rolling  Once       Question Answer Comment  Walker: With 5 Inch Wheels   Patient needs a walker to treat with the following condition Status post total right knee replacement      07/13/22 1630            Diagnostic Studies: DG Knee Right Port  Result Date: 07/13/2022 CLINICAL DATA:  161096 Post-operative state 252351 EXAM: PORTABLE RIGHT KNEE - 1-2 VIEW COMPARISON:  Radiograph  10/18/2021 FINDINGS: Postsurgical changes of right total knee arthroplasty. Normal alignment. No evidence of loosening or periprosthetic fracture. Expected soft tissue changes including a joint effusion and soft tissue gas. IMPRESSION: Postsurgical changes of right total knee arthroplasty. Normal alignment. No evidence of immediate hardware complication. Electronically Signed   By: Caprice Renshaw M.D.   On: 07/13/2022 16:17    Disposition: Discharge disposition: 01-Home or  Self Care          Follow-up Information     Kathryne Hitch, MD Follow up in 2 week(s).   Specialty: Orthopedic Surgery Contact information: [address] [place] [address] [phone]         Care, [facility] Follow up.   Specialty: Home Health Services Why: [facility] Furbish will provide PT in the home after discharge. Contact information: [address] [place] [address] [phone]                  Signed: Kathryne Hitch 07/16/2022, 1:33 PM

## 2022-07-17 ENCOUNTER — Telehealth: Payer: Self-pay | Admitting: Orthopaedic Surgery

## 2022-07-17 ENCOUNTER — Other Ambulatory Visit: Payer: Self-pay | Admitting: Orthopaedic Surgery

## 2022-07-17 ENCOUNTER — Other Ambulatory Visit (HOSPITAL_COMMUNITY): Payer: Self-pay

## 2022-07-17 DIAGNOSIS — Z96651 Presence of right artificial knee joint: Secondary | ICD-10-CM | POA: Diagnosis not present

## 2022-07-17 DIAGNOSIS — I129 Hypertensive chronic kidney disease with stage 1 through stage 4 chronic kidney disease, or unspecified chronic kidney disease: Secondary | ICD-10-CM | POA: Diagnosis not present

## 2022-07-17 DIAGNOSIS — Z471 Aftercare following joint replacement surgery: Secondary | ICD-10-CM | POA: Diagnosis not present

## 2022-07-17 DIAGNOSIS — I340 Nonrheumatic mitral (valve) insufficiency: Secondary | ICD-10-CM | POA: Diagnosis not present

## 2022-07-17 DIAGNOSIS — N189 Chronic kidney disease, unspecified: Secondary | ICD-10-CM | POA: Diagnosis not present

## 2022-07-17 MED ORDER — ONDANSETRON 4 MG PO TBDP
4.0000 mg | ORAL_TABLET | Freq: Three times a day (TID) | ORAL | 0 refills | Status: DC | PRN
  Filled 2022-07-17: qty 20, 7d supply, fill #0

## 2022-07-17 NOTE — Telephone Encounter (Signed)
Victoria [name] (PT) from [facility] called with several concerns and verbal orders for home health therapy. Asking for home health physical therapy 2wk 4. Also asking if dressing on incision needs to stay on until upcoming appt on 5/30. Concerns: 1.pt hallucinating 2. Fever 104 3. Pain 5 out 10 is past 10 when walking 4. Report slurred speech at hospital and at home ( possible from medication) 5. Nausea 6. Pain when swallowing 7. Not using compression hose but states to start tomorrow w/ wearing them 8. Number of medication taking PRN and holding off taking them due to dizziness, sitting & standing 9. BP standing 80/50, laying down BP 130/80. Waited til end of session BP sitting 120/80. Please call Rosanne Ashing on secure line at [phone]

## 2022-07-18 ENCOUNTER — Other Ambulatory Visit: Payer: Self-pay | Admitting: Orthopaedic Surgery

## 2022-07-18 ENCOUNTER — Telehealth: Payer: Self-pay | Admitting: Orthopaedic Surgery

## 2022-07-18 ENCOUNTER — Telehealth: Payer: Self-pay | Admitting: Internal Medicine

## 2022-07-18 ENCOUNTER — Other Ambulatory Visit (HOSPITAL_COMMUNITY): Payer: Self-pay

## 2022-07-18 ENCOUNTER — Other Ambulatory Visit: Payer: Self-pay

## 2022-07-18 ENCOUNTER — Emergency Department (HOSPITAL_BASED_OUTPATIENT_CLINIC_OR_DEPARTMENT_OTHER): Payer: 59

## 2022-07-18 ENCOUNTER — Emergency Department (HOSPITAL_BASED_OUTPATIENT_CLINIC_OR_DEPARTMENT_OTHER)
Admission: EM | Admit: 2022-07-18 | Discharge: 2022-07-18 | Disposition: A | Discharge status: Home / Self Care | Payer: 59 | Attending: Emergency Medicine | Admitting: Emergency Medicine

## 2022-07-18 ENCOUNTER — Ambulatory Visit: Payer: No Typology Code available for payment source | Admitting: Obstetrics and Gynecology

## 2022-07-18 ENCOUNTER — Encounter (HOSPITAL_BASED_OUTPATIENT_CLINIC_OR_DEPARTMENT_OTHER): Payer: Self-pay | Admitting: Emergency Medicine

## 2022-07-18 DIAGNOSIS — M25561 Pain in right knee: Secondary | ICD-10-CM | POA: Diagnosis not present

## 2022-07-18 DIAGNOSIS — T402X5A Adverse effect of other opioids, initial encounter: Secondary | ICD-10-CM | POA: Insufficient documentation

## 2022-07-18 DIAGNOSIS — R443 Hallucinations, unspecified: Secondary | ICD-10-CM | POA: Insufficient documentation

## 2022-07-18 DIAGNOSIS — R4781 Slurred speech: Secondary | ICD-10-CM | POA: Diagnosis not present

## 2022-07-18 DIAGNOSIS — Z7982 Long term (current) use of aspirin: Secondary | ICD-10-CM | POA: Insufficient documentation

## 2022-07-18 DIAGNOSIS — R4182 Altered mental status, unspecified: Secondary | ICD-10-CM | POA: Diagnosis not present

## 2022-07-18 DIAGNOSIS — Z96651 Presence of right artificial knee joint: Secondary | ICD-10-CM | POA: Insufficient documentation

## 2022-07-18 DIAGNOSIS — M7989 Other specified soft tissue disorders: Secondary | ICD-10-CM | POA: Diagnosis not present

## 2022-07-18 DIAGNOSIS — Z7901 Long term (current) use of anticoagulants: Secondary | ICD-10-CM | POA: Insufficient documentation

## 2022-07-18 DIAGNOSIS — R404 Transient alteration of awareness: Secondary | ICD-10-CM | POA: Diagnosis not present

## 2022-07-18 DIAGNOSIS — R600 Localized edema: Secondary | ICD-10-CM | POA: Diagnosis not present

## 2022-07-18 DIAGNOSIS — T887XXA Unspecified adverse effect of drug or medicament, initial encounter: Secondary | ICD-10-CM

## 2022-07-18 LAB — COMPREHENSIVE METABOLIC PANEL
ALT: 43 U/L (ref 0–44)
AST: 35 U/L (ref 15–41)
Albumin: 3.7 g/dL (ref 3.5–5.0)
Alkaline Phosphatase: 102 U/L (ref 38–126)
Anion gap: 12 (ref 5–15)
BUN: 14 mg/dL (ref 8–23)
CO2: 20 mmol/L — ABNORMAL LOW (ref 22–32)
Calcium: 8.5 mg/dL — ABNORMAL LOW (ref 8.9–10.3)
Chloride: 105 mmol/L (ref 98–111)
Creatinine, Ser: 1.11 mg/dL — ABNORMAL HIGH (ref 0.44–1.00)
GFR, Estimated: 57 mL/min — ABNORMAL LOW (ref >60)
Glucose, Bld: 85 mg/dL (ref 70–99)
Potassium: 4.1 mmol/L (ref 3.5–5.1)
Sodium: 137 mmol/L (ref 135–145)
Total Bilirubin: 1 mg/dL (ref 0.3–1.2)
Total Protein: 7.2 g/dL (ref 6.5–8.1)

## 2022-07-18 LAB — RAPID URINE DRUG SCREEN, HOSP PERFORMED
Amphetamines: NOT DETECTED
Barbiturates: NOT DETECTED
Benzodiazepines: POSITIVE — AB
Cocaine: NOT DETECTED
Opiates: POSITIVE — AB
Tetrahydrocannabinol: NOT DETECTED

## 2022-07-18 LAB — URINALYSIS, ROUTINE W REFLEX MICROSCOPIC
Bilirubin Urine: NEGATIVE
Glucose, UA: NEGATIVE mg/dL
Ketones, ur: NEGATIVE mg/dL
Nitrite: NEGATIVE
Protein, ur: NEGATIVE mg/dL
Specific Gravity, Urine: 1.006 (ref 1.005–1.030)
pH: 5.5 (ref 5.0–8.0)

## 2022-07-18 LAB — CBC WITH DIFFERENTIAL/PLATELET
Abs Immature Granulocytes: 0.02 10*3/uL (ref 0.00–0.07)
Basophils Absolute: 0 10*3/uL (ref 0.0–0.1)
Basophils Relative: 0 %
Eosinophils Absolute: 0.2 10*3/uL (ref 0.0–0.5)
Eosinophils Relative: 2 %
HCT: 29.6 % — ABNORMAL LOW (ref 36.0–46.0)
Hemoglobin: 9.9 g/dL — ABNORMAL LOW (ref 12.0–15.0)
Immature Granulocytes: 0 %
Lymphocytes Relative: 18 %
Lymphs Abs: 1.2 10*3/uL (ref 0.7–4.0)
MCH: 33.4 pg (ref 26.0–34.0)
MCHC: 33.4 g/dL (ref 30.0–36.0)
MCV: 100 fL (ref 80.0–100.0)
Monocytes Absolute: 0.6 10*3/uL (ref 0.1–1.0)
Monocytes Relative: 10 %
Neutro Abs: 4.4 10*3/uL (ref 1.7–7.7)
Neutrophils Relative %: 70 %
Platelets: 200 10*3/uL (ref 150–400)
RBC: 2.96 MIL/uL — ABNORMAL LOW (ref 3.87–5.11)
RDW: 13.9 % (ref 11.5–15.5)
WBC: 6.4 10*3/uL (ref 4.0–10.5)
nRBC: 0 % (ref 0.0–0.2)

## 2022-07-18 LAB — ETHANOL: Alcohol, Ethyl (B): <10 mg/dL (ref <10)

## 2022-07-18 MED ORDER — HYDROCODONE-ACETAMINOPHEN 5-325 MG PO TABS
1.0000 | ORAL_TABLET | Freq: Four times a day (QID) | ORAL | 0 refills | Status: DC | PRN
  Filled 2022-07-18 (×2): qty 30, 4d supply, fill #0

## 2022-07-18 NOTE — Telephone Encounter (Signed)
[facility] home care Threasa Alpha calling to advise level 2 medication interaction Celebrex and eliquis, and  Escitalopram and Fuecainide [phone] Just and FYI

## 2022-07-18 NOTE — ED Provider Notes (Signed)
[place] EMERGENCY DEPARTMENT AT [facility] Provider Note   CSN: [id number] Arrival date & time: 07/18/22  1136     History  Chief Complaint  Patient presents with   Post-op Problem    Victoria [name] is a 62 y.o. female.  62 year old female with a history of right knee replacement on 07/13/2022 for arthritis, secondary hyperparathyroidism, gastric bypass, and lymphocytic colitis who presents to the emergency department with multiple complaints including slurred speech, hallucinations, and knee pain.  Since the patient's surgery she was discharged home on oxycodone and reports that over the past several days has been having occasional slurred speech which appears to be consistent with when she takes her oxycodone.  Also was having hallucinations where she would see people in the room that were not there and would attempt to talk to them.  Oxycodone was discontinued.  She is lucid at this time.  Also reports that today her right knee appears more swollen and painful than before.  It is more red.  Has noticed some drainage on the dressing.  Is having difficulty bending it due to the pain but is unsure if this is because the oxycodone was stopped.  Yesterday did have a fever of 100.4.  Has been taking Tylenol at home for her pain.  No chest pain or shortness of breath.  No weakness or numbness of her arms or legs aside from some numbness of her toes on the side of her surgery which she thinks is due to the swelling.       Home Medications Prior to Admission medications   Medication Sig Start Date End Date Taking? Authorizing Provider  Adalimumab (HUMIRA, 2 PEN,) 40 MG/0.8ML PNKT Inject 1 pen subcutaneously every week Patient taking differently: once a week. 03/28/22   Quentin Angst, MD  albuterol (VENTOLIN HFA) 108 (90 Base) MCG/ACT inhaler Inhale 2 puffs into the lungs every 4 hours as needed for wheezing or shortness of breath. 08/23/21   Verlee Monte, MD   ALPRAZolam Prudy Feeler) 0.5 MG tablet Take 1 tablet (0.5 mg total) by mouth in the morning AND 1 tablet (0.5 mg total) daily at 12 noon AND 2 tablets (1 mg total) at bedtime. 07/09/22   Margaree Mackintosh, MD  apixaban (ELIQUIS) 2.5 MG TABS tablet Take 1 tablet (2.5 mg total) by mouth every 12 (twelve) hours. Patient not taking: Reported on 07/19/2022 07/14/22   Kathryne Hitch, MD  aspirin EC 81 MG tablet Take 81 mg by mouth 2 (two) times daily. Swallow whole.    [provider]  Carbinoxamine Maleate 4 MG TABS Take 1-2 tablets at night as needed for drainage and allergies. May cause drowsiness. 05/09/22   Verlee Monte, MD  celecoxib (CELEBREX) 200 MG capsule Take 1 capsule (200 mg total) by mouth 2 (two) times daily as needed. 10/18/21   Kathryne Hitch, MD  clobetasol ointment (TEMOVATE) 0.05 % Apply to skin twice daily as directed for up to 2 weeks as needed 11/24/21   Romualdo Bolk, MD  cyanocobalamin (DODEX) 1000 MCG/ML injection INJECT 1 ML INTRAMUSCULARLY EVERY MONTH 08/28/21 09/05/22  Margaree Mackintosh, MD  ergocalciferol (VITAMIN D2) 1.25 MG (50000 UT) capsule Take 1 capsule (50,000 Units total) by mouth 2 (two) times a week for Vitamin deficiency and gastric bypass. 03/19/22   Worthy Rancher, MD  escitalopram (LEXAPRO) 10 MG tablet Take 1 tablet (10 mg total) by mouth daily. 01/12/22   Margaree Mackintosh, MD  Estradiol 0.75 MG/1.25 GM (0.06%) topical gel Place 1.25 g onto the skin daily. Patient taking differently: Place 1.25 g onto the skin every other day. 06/15/22   Romualdo Bolk, MD  flecainide (TAMBOCOR) 50 MG tablet Take 1 tablet (50 mg total) by mouth daily at 6 (six) AM. 05/31/22   Turner, Cornelious Bryant, MD  gabapentin (NEURONTIN) 600 MG tablet Take 1 tablet (600 mg total) by mouth 3 (three) times daily. Patient taking differently: Take 600 mg by mouth at bedtime. Additional 600 mg if needed for pain 11/02/21   Margaree Mackintosh, MD  Galcanezumab-gnlm [facility]) 120 MG/ML  SOAJ Inject 120 mg into the skin every 30 (thirty) days. 07/02/22   Anson Fret, MD  HYDROcodone-acetaminophen (NORCO/VICODIN) 5-325 MG tablet Take 1-2 tablets by mouth every 6 (six) hours as needed for moderate pain. 07/18/22   Kathryne Hitch, MD  ipratropium (ATROVENT) 0.03 % nasal spray Use 1-2 sprays in each nostril up to 3 times daily as needed for runny nose. Can take 15 minutes prior to meals. 05/09/22   Verlee Monte, MD  levocetirizine (XYZAL) 5 MG tablet Take 1 tablet (5 mg total) by mouth every evening. 05/09/22   Verlee Monte, MD  levothyroxine (SYNTHROID) 112 MCG tablet Take 1 tablet (112 mcg total) by mouth daily. 06/20/22   Margaree Mackintosh, MD  magic mouthwash (nystatin, hydrocortisone, diphenhydrAMINE) suspension Swish and spit 30 mLs 4 (four) times daily. 07/19/22   Margaree Mackintosh, MD  metoprolol succinate (TOPROL-XL) 25 MG 24 hr tablet Take 1 tablet (25 mg total) by mouth daily. 05/31/22   Quintella Reichert, MD  montelukast (SINGULAIR) 10 MG tablet Take 1 tablet (10 mg total) by mouth at bedtime. 05/31/22   Verlee Monte, MD  Olopatadine HCl 0.2 % SOLN Apply 1 drop to eye daily as needed. Patient taking differently: Apply 1 drop to eye daily as needed (allergies). 08/23/21   Verlee Monte, MD  ondansetron (ZOFRAN-ODT) 4 MG disintegrating tablet Take 1 tablet (4 mg total) by mouth every 8 (eight) hours as needed for nausea or vomiting. 07/17/22   Kathryne Hitch, MD  oxyCODONE-acetaminophen (PERCOCET) 10-325 MG tablet Take 1 tablet by mouth every 8 (eight) hours as needed for up to 5 days for pain. 07/19/22 07/24/22  Margaree Mackintosh, MD  pantoprazole (PROTONIX) 40 MG tablet Take 1 tablet by mouth daily 20 minutes before breakfast Patient taking differently: Take 40 mg by mouth daily as needed (Heartburn). 02/01/21     promethazine (PHENERGAN) 25 MG tablet Take 1 tablet (25 mg total) by mouth every 8 (eight) hours as needed for N/V. 03/21/22   Margaree Mackintosh, MD   sulfamethoxazole-trimethoprim (BACTRIM DS) 800-160 MG tablet Take 1 tablet by mouth 2 (two) times daily as needed for flare 11/09/21     terconazole (TERAZOL 7) 0.4 % vaginal cream Place 1 applicatorful vaginally at bedtime for seven nights.   Then use one applicatorful once a week for 6 months. 11/24/21   Romualdo Bolk, MD  tiZANidine (ZANAFLEX) 4 MG capsule Take 1 capsule (4 mg total) by mouth 3 (three) times daily as needed for muscle spasms. 07/02/22   Margaree Mackintosh, MD  triamcinolone cream (KENALOG) 0.1 % Apply 1 Application topically 2 (two) times daily. Use for 2 weeks, and then off for 2 weeks as needed. 05/29/22     Ubrogepant (UBRELVY) 100 MG TABS Take 1 tablet (100 mg total) by mouth every 2 (two)  hours as needed. Maximum 200mg  a day. 07/02/22   Anson Fret, MD      Allergies    Other and Estradiol    Review of Systems   Review of Systems  Physical Exam Updated Vital Signs BP (!) 142/86 (BP Location: Right Arm)   Pulse 78   Temp 98.9 F (37.2 C) (Oral)   Resp 15   SpO2 99%  Physical Exam Vitals and nursing note reviewed.  Constitutional:      General: She is not in acute distress.    Appearance: She is well-developed.     Comments: Alert and oriented x 3 no slurred speech during exam  HENT:     Head: Normocephalic and atraumatic.     Right Ear: External ear normal.     Left Ear: External ear normal.     Nose: Nose normal.  Eyes:     Extraocular Movements: Extraocular movements intact.     Conjunctiva/sclera: Conjunctivae normal.     Pupils: Pupils are equal, round, and reactive to light.  Cardiovascular:     Rate and Rhythm: Normal rate and regular rhythm.     Heart sounds: No murmur heard. Pulmonary:     Effort: Pulmonary effort is normal. No respiratory distress.     Breath sounds: Normal breath sounds.  Musculoskeletal:     Cervical back: Normal range of motion and neck supple.     Right lower leg: Edema present.     Left lower leg: No edema.      Comments: Dopplerable pulse in right lower extremity.  2+ edema in right lower extremity.  Trace to 1+ left lower extremity.  Erythema and mild amount of warmth of the right knee.  Patient able to flex knee to 30 degrees.  Dressing removed and no discharge noted from the surgical incision site.  Skin:    General: Skin is warm and dry.  Neurological:     Mental Status: She is alert. Mental status is at baseline.     Comments: MENTAL STATUS: AAOx3 No aphasia CRANIAL NERVES: II: Pupils equal and reactive 4 mm BL, no RAPD, no VF deficits III, IV, VI: EOM intact, no gaze preference or deviation, no nystagmus. V: normal sensation to light touch in V1, V2, and V3 segments bilaterally VII: no facial weakness or asymmetry, no nasolabial fold flattening VIII: normal hearing to speech IX, X: normal palatal elevation, no uvular deviation XI: 5/5 head turn and 5/5 shoulder shrug bilaterally XII: midline tongue protrusion MOTOR: 5/5 strength in R shoulder flexion, elbow flexion and extension, and grip strength. 5/5 strength in L shoulder flexion, elbow flexion and extension, and grip strength.  4/5 strength in R hip and knee flexion, knee extension, ankle plantar and dorsiflexion limited due to pain from knee surgery knee surgery knee surgery. 5/5 strength in L hip and knee flexion, knee extension, ankle plantar and dorsiflexion. SENSORY: Normal sensation to light touch in all extremities COORD: Normal finger to nose and heel to shin, no tremor, no dysmetria other than difficulty with RLE heel to shin due to pain STATION: no truncal ataxia   Psychiatric:        Mood and Affect: Mood normal.       ED Results / Procedures / Treatments   Labs (all labs ordered are listed, but only abnormal results are displayed) Labs Reviewed  COMPREHENSIVE METABOLIC PANEL - Abnormal; Notable for the following components:      Result Value   CO2 20 (*)  Creatinine, Ser 1.11 (*)    Calcium 8.5 (*)    GFR,  Estimated 57 (*)    All other components within normal limits  RAPID URINE DRUG SCREEN, HOSP PERFORMED - Abnormal; Notable for the following components:   Opiates POSITIVE (*)    Benzodiazepines POSITIVE (*)    All other components within normal limits  CBC WITH DIFFERENTIAL/PLATELET - Abnormal; Notable for the following components:   RBC 2.96 (*)    Hemoglobin 9.9 (*)    HCT 29.6 (*)    All other components within normal limits  URINALYSIS, ROUTINE W REFLEX MICROSCOPIC - Abnormal; Notable for the following components:   Hgb urine dipstick MODERATE (*)    Leukocytes,Ua SMALL (*)    Bacteria, UA RARE (*)    All other components within normal limits  ETHANOL    EKG EKG Interpretation  Date/Time:  Wednesday Jul 18 2022 13:21:28 EDT Ventricular Rate:  74 PR Interval:  150 QRS Duration: 99 QT Interval:  392 QTC Calculation: 435 R Axis:   85 Text Interpretation: Sinus rhythm Borderline right axis deviation Confirmed by Vonita Moss [phone]) on 07/18/2022 3:16:26 PM  Radiology US Venous Img Lower Unilateral Right  Result Date: 07/18/2022 CLINICAL DATA:  Recent right knee surgery pain and edema EXAM: Right LOWER EXTREMITY VENOUS DOPPLER ULTRASOUND TECHNIQUE: Gray-scale sonography with compression, as well as color and duplex ultrasound, were performed to evaluate the deep venous system(s) from the level of the common femoral vein through the popliteal and proximal calf veins. COMPARISON:  None Available. FINDINGS: VENOUS Normal compressibility of the common femoral, superficial femoral, and popliteal veins, as well as the visualized calf veins. Visualized portions of profunda femoral vein and great saphenous vein unremarkable. No filling defects to suggest DVT on grayscale or color Doppler imaging. Doppler waveforms show normal direction of venous flow, normal respiratory plasticity and response to augmentation. Limited views of the contralateral common femoral vein are unremarkable.  OTHER None. Limitations: none IMPRESSION: Negative. Electronically Signed   By: Jasmine Pang M.D.   On: 07/18/2022 15:10   CT HEAD WO CONTRAST  Result Date: 07/18/2022 CLINICAL DATA:  Altered mental status.  Slurred speech. EXAM: CT HEAD WITHOUT CONTRAST TECHNIQUE: Contiguous axial images were obtained from the base of the skull through the vertex without intravenous contrast. RADIATION DOSE REDUCTION: This exam was performed according to the departmental dose-optimization program which includes automated exposure control, adjustment of the mA and/or kV according to patient size and/or use of iterative reconstruction technique. COMPARISON:  Head MRI 01/28/2019 FINDINGS: Brain: There is no evidence of an acute infarct, intracranial hemorrhage, mass, midline shift, or extra-axial fluid collection. The ventricles and sulci are normal. Vascular: No hyperdense vessel. Skull: No acute fracture or suspicious osseous lesion. Sinuses/Orbits: Paranasal sinuses and mastoid air cells are clear. Bilateral cataract extraction. Other: None. IMPRESSION: Negative head CT. Electronically Signed   By: Sebastian Ache M.D.   On: 07/18/2022 14:44    Procedures Procedures    Medications Ordered in ED Medications - No data to display  ED Course/ Medical Decision Making/ A&P                             Medical Decision Making Amount and/or Complexity of Data Reviewed Labs: ordered. Radiology: ordered.   Victoria [name] is a 62 y.o. female with comorbidities that complicate the patient evaluation including right knee replacement on 07/13/2022 for arthritis, secondary hyperparathyroidism, gastric bypass,  and lymphocytic colitis who presents to the emergency department with multiple complaints including slurred speech, hallucinations, and knee pain.   Initial Ddx:  Medication side effect, delirium, stroke, postoperative infection, DVT, normal postoperative swelling  MDM:  Feel that with the patient's  hallucinations and slurred speech could potentially be due to her oxycodone since it seems to be related to when she receives this medication.  Could also potentially be delirious due to the pain from her procedure or from a urinary tract infection.  Low concern for stroke at this time since she does not have any focal neurologic deficits.  With her age and the fact that she is currently on blood thinners will obtain a CT head.  With her swollen knee could be normal postoperative healing but infection is also on the differential so we will reach out to orthopedics.  Leg is swollen and given her recent surgery will obtain an ultrasound to rule out DVT but with her treatment with her blood thinner use feel that this is less likely.  Plan:  Labs Labs Urinalysis CT head DVT US Ortho consult  ED Summary/Re-evaluation:  Patient mains stable in the emergency department did not have any bouts of confusion or slurred speech at all.  CT head unremarkable and feel that her symptoms likely are due to her oxycodone use.  Discussed with orthopedics who will see her tomorrow but they feel that postop infection is less likely. No signs of DVT on her ultrasound.  And counseled the patient to follow-up with her primary doctor in 2 to 3 days regarding her symptoms and orthopedics tomorrow for her knee.  This patient presents to the ED for concern of complaints listed in HPI, this involves an extensive number of treatment options, and is a complaint that carries with it a high risk of complications and morbidity. Disposition including potential need for admission considered.   Dispo: DC Home. Return precautions discussed including, but not limited to, those listed in the AVS. Allowed pt time to ask questions which were answered fully prior to dc.  Records reviewed Outpatient Clinic Notes The following labs were independently interpreted: Chemistry and show no acute abnormality I independently reviewed the following  imaging with scope of interpretation limited to determining acute life threatening conditions related to emergency care: CT Head and agree with the radiologist interpretation with the following exceptions: none I personally reviewed and interpreted cardiac monitoring: normal sinus rhythm  I personally reviewed and interpreted the pt's EKG: see above for interpretation  I have reviewed the patients home medications and made adjustments as needed Consults: Orthopedics Social Determinants of health:  Elderly         Final Clinical Impression(s) / ED Diagnoses Final diagnoses:  Transient alteration of awareness  History of total right knee replacement  Acute pain of right knee  Medication side effect    Rx / DC Orders ED Discharge Orders     None         Rondel Baton, MD 07/20/22 1202

## 2022-07-18 NOTE — ED Triage Notes (Signed)
Pt arrives pov, to triage in wheelchair. Endorses recent knee repl surgery. Was referred by PCP to r/o stroke. Pt reports slured speech x 2 days with hallucinations. Pt speech clear. RT knee redness and swelling noted PT reports decreased sensation to RT foot. Cap refill <3. Pt denies shob

## 2022-07-18 NOTE — Telephone Encounter (Signed)
Hanae Mckissick [phone]  [name] called to say she is having hallucination, dizziness, slurred speech and she blacked out last night. She called her ortho doctor and they said for her to call [facility] and to be checked out by them. She just come home from hospital on Monday from surgery.

## 2022-07-18 NOTE — Telephone Encounter (Signed)
Patient aware this was sent in for her  

## 2022-07-18 NOTE — ED Notes (Signed)
Pt discharged home and given discharge paperwork. Opportunities given for questions. Pt verbalizes understanding. PIV removed x1. Ivone Licht R , RN 

## 2022-07-18 NOTE — Discharge Instructions (Signed)
You were seen for your knee pain and hallucinations in the emergency department. The hallucinations are likely due to the oxycodone.   At home, please continue your pain medications as prescribed.    Follow-up with Dr Magnus Ivan tomorrow. Follow-up with your primary doctor in 2-3 days regarding your visit.    Return immediately to the emergency department if you experience any of the following: slurred speech not related to the oxycodone, fevers, or any other concerning symptoms.    Thank you for visiting our Emergency Department. It was a pleasure taking care of you today.

## 2022-07-19 ENCOUNTER — Ambulatory Visit (INDEPENDENT_AMBULATORY_CARE_PROVIDER_SITE_OTHER): Payer: 59 | Admitting: Internal Medicine

## 2022-07-19 ENCOUNTER — Other Ambulatory Visit (HOSPITAL_COMMUNITY): Payer: Self-pay

## 2022-07-19 ENCOUNTER — Other Ambulatory Visit: Payer: Self-pay

## 2022-07-19 ENCOUNTER — Ambulatory Visit (INDEPENDENT_AMBULATORY_CARE_PROVIDER_SITE_OTHER): Payer: 59 | Admitting: Orthopaedic Surgery

## 2022-07-19 ENCOUNTER — Telehealth: Payer: Self-pay

## 2022-07-19 VITALS — BP 128/80 | HR 77 | Temp 98.0°F | Ht 67.0 in | Wt 279.0 lb

## 2022-07-19 DIAGNOSIS — K140 Glossitis: Secondary | ICD-10-CM

## 2022-07-19 DIAGNOSIS — G8918 Other acute postprocedural pain: Secondary | ICD-10-CM

## 2022-07-19 DIAGNOSIS — Z96651 Presence of right artificial knee joint: Secondary | ICD-10-CM

## 2022-07-19 DIAGNOSIS — F411 Generalized anxiety disorder: Secondary | ICD-10-CM | POA: Diagnosis not present

## 2022-07-19 MED ORDER — OXYCODONE-ACETAMINOPHEN 10-325 MG PO TABS
1.0000 | ORAL_TABLET | Freq: Three times a day (TID) | ORAL | 0 refills | Status: AC | PRN
  Filled 2022-07-19 (×2): qty 15, 5d supply, fill #0

## 2022-07-19 MED ORDER — NYSTATIN 100000 UNIT/ML MT SUSP
30.0000 mL | Freq: Four times a day (QID) | OROMUCOSAL | 0 refills | Status: DC
  Filled 2022-07-19: qty 480, 4d supply, fill #0

## 2022-07-19 NOTE — Progress Notes (Signed)
The patient is being seen today as a relates to her recent right total knee arthroplasty.  It will be a week tomorrow when we replaced her knee and we just discharged her on Monday of this week.  She then went to the emergency room yesterday out of concern of somnolence.  There was no evidence infection and the appropriate to discharge her since we did see in the office today.  She looks much better overall today.  She has been on Eliquis twice a day and I told her to stop that and start just a baby aspirin.  She can get back on oxycodone now since she is definitely feeling better and looking better.  Examination of her right knee she has no evidence of infection at all.  There is swelling to be expected and she is on Eliquis.  There is a hematoma but again no evidence infection at all.  I did place a new Aquacel dressing.  She will keep her regular follow-up on May 30.  Starting tomorrow she will go down to a baby aspirin twice daily and stop her Eliquis today.  She can continue her oxycodone as well.  We will see her at her regular follow-up for assessment of her incision and regular routine follow-up postop

## 2022-07-19 NOTE — Telephone Encounter (Signed)
You want her to come in today? Is that what you said yesterday?

## 2022-07-19 NOTE — Telephone Encounter (Signed)
LMOM for patient to call me back, we want her to come in and see Dr. Magnus Ivan this afternoon

## 2022-07-19 NOTE — Progress Notes (Signed)
Patient Care Team: Margaree Mackintosh, MD as PCP - General Quintella Reichert, MD as PCP - Cardiology (Cardiology) Linna Darner, RD as Dietitian (Family Medicine)  Visit Date: 07/19/22  Subjective:    Patient ID: Victoria [name] , Female   DOB: 10-Nov-1960, 61 y.o.    MRN: [id number]   62 y.o. Female presents today for hospital/ED follow-up. Seen in ED 07/18/22 for somnolence. No evidence of infection. Taken off of Eliquis and put on baby aspirin. Status post total right knee replacement 07/13/22. She has started home PT. Seen by Dr. Magnus Ivan, orthopedist, today who placed new Aquacel dressing. Follow-up on 07/26/22.   Having stinging pain on her tongue.   Past Medical History:  Diagnosis Date   Anxiety    Back pain    Bursitis    right shoulder   Chronic kidney disease    hematuria, has been worked up, her normal   Chronic leukopenia    intermittant since 2010, followed by Dr. Lenord Fellers now   Chronic neck pain    Constipation    Degenerative joint disease of low back 09/02/2015   Dr. Despina Hick   Displacement of lumbar intervertebral disc    Dysrhythmia    Tachycardia, PVCs, PACs   Edema of both lower legs    Fibrocystic breast    Gallbladder problem    GERD (gastroesophageal reflux disease)    Headache    per pt more stress/tension   Heart palpitations    SEE EPIC ENCOUTNER , CARDIOLOGY DR. Gloris [place] TURNER 2018; reports on 05-16-17 " i haven't had any bouts of those lately"     Hemorrhoids    Hidradenitis suppurativa    History of Clostridium difficile infection 12/2010   History of exercise intolerance    ETT on 04-27-2016-- negative ([facility] treadmill score 7)   History of Helicobacter pylori infection 2001 and 09/ 2012   HSV-2 infection    genital   Hx of adenomatous colonic polyps 10/17/2007   Hydradenitis    per pt currently treated with Humira injection   Hypertension    Hyperthyroidism    Hypocupremia    Hypothyroidism    Hypothyroidism, postsurgical 1980   IBS  (irritable bowel syndrome)    Insomnia    Iron deficiency    Joint pain    Leukopenia    Lumbosacral radiculopathy    Lymphocytic colitis 05/14/2022   dx from colonoscopy   Medial meniscus tear    right knee   Mild obstructive sleep apnea    Mitral regurgitation    Mild to moderate by echo 09/2021   Numbness and tingling of right arm    Obesity    Osteoarthritis    Palpitations    Pernicious anemia    b12 def   Peroneal neuropathy    PONV (postoperative nausea and vomiting)    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   Prediabetes    Reactive hypoglycemia followed by pcp   post gastrectomy dumping syndrome   SOB (shortness of breath)    Spinal stenosis    Tendonitis    right shoulder   Varicose veins    Vertigo    Vitamin B 12 deficiency    Vitamin D deficiency      Family History  Problem Relation Age of Onset   High blood pressure Mother    Hypertension Mother    Headache Mother    High Cholesterol Mother    Thyroid disease Mother  Obesity Mother    Kidney disease Mother    Hypertension Father    Diabetes Father    High blood pressure Father    High Cholesterol Father    Heart disease Father    Obesity Father    Diabetes Sister    Hypertension Sister    Thyroid disease Brother    Thyroid disease Maternal Aunt    Migraines Neg Hx     Social History   Social History Narrative   Lives at home with spouse   Right handed   Caffeine: diet zero mtn dew, occasionally       Review of Systems  Constitutional:  Negative for fever and malaise/fatigue.  HENT:  Negative for congestion.        (+) Tongue pain  Eyes:  Negative for blurred vision.  Respiratory:  Negative for cough and shortness of breath.   Cardiovascular:  Negative for chest pain, palpitations and leg swelling.  Gastrointestinal:  Negative for vomiting.  Musculoskeletal:  Positive for joint pain (Right knee). Negative for back pain.  Skin:  Negative for rash.  Neurological:  Negative  for loss of consciousness and headaches.        Objective:   Vitals: BP 128/80   Pulse 77   Temp 98 F (36.7 C) (Tympanic)   Ht 5\' 7"  (1.702 m)   Wt 279 lb (126.6 kg)   SpO2 98%   BMI 43.70 kg/m    Physical Exam Vitals and nursing note reviewed.  Constitutional:      General: She is not in acute distress.    Appearance: Normal appearance. She is not toxic-appearing.  HENT:     Head: Normocephalic and atraumatic.     Right Ear: Hearing, tympanic membrane, ear canal and external ear normal.     Left Ear: Hearing, tympanic membrane, ear canal and external ear normal.     Mouth/Throat:     Comments: Erythematous tongue with furrows- tender towards distal third of the tongue. No buccal lesions. Pulmonary:     Effort: Pulmonary effort is normal.  Skin:    General: Skin is warm and dry.  Neurological:     Mental Status: She is alert and oriented to person, place, and time. Mental status is at baseline.  Psychiatric:        Mood and Affect: Mood normal.        Behavior: Behavior normal.        Thought Content: Thought content normal.        Judgment: Judgment normal.       Results:   Studies obtained and personally reviewed by me:   Labs:       Component Value Date/Time   NA 137 07/18/2022 1300   NA 141 05/24/2022 1122   K 4.1 07/18/2022 1300   CL 105 07/18/2022 1300   CO2 20 (L) 07/18/2022 1300   GLUCOSE 85 07/18/2022 1300   BUN 14 07/18/2022 1300   BUN 20 05/24/2022 1122   CREATININE 1.11 (H) 07/18/2022 1300   CREATININE 1.23 (H) 07/09/2022 1132   CALCIUM 8.5 (L) 07/18/2022 1300   PROT 7.2 07/18/2022 1300   PROT 7.1 05/24/2022 1122   ALBUMIN 3.7 07/18/2022 1300   ALBUMIN 4.1 05/24/2022 1122   AST 35 07/18/2022 1300   AST 26 10/16/2018 0903   ALT 43 07/18/2022 1300   ALT 26 10/16/2018 0903   ALKPHOS 102 07/18/2022 1300   BILITOT 1.0 07/18/2022 1300   BILITOT <0.2 05/24/2022 1122  BILITOT 0.5 10/16/2018 0903   GFRNONAA 57 (L) 07/18/2022 1300    GFRNONAA 93 06/30/2020 1105   GFRAA 108 06/30/2020 1105     Lab Results  Component Value Date   WBC 6.4 07/18/2022   HGB 9.9 (L) 07/18/2022   HCT 29.6 (L) 07/18/2022   MCV 100.0 07/18/2022   PLT 200 07/18/2022    Lab Results  Component Value Date   CHOL 197 07/05/2022   HDL 86 07/05/2022   LDLCALC 99 07/05/2022   TRIG 41 07/05/2022   CHOLHDL 2.3 07/05/2022    Lab Results  Component Value Date   HGBA1C 5.7 (H) 07/05/2022     Lab Results  Component Value Date   TSH 3.17 07/05/2022      Assessment & Plan:   Post-operative right knee pain: prescribed Percocet 10-325 every 8 hours as needed. Status post total right knee replacement 07/13/22. She has started home PT. Seen by Dr. Magnus Ivan, orthopedist today who placed new Aquacel dressing. Has a follow-up with him on 07/26/22.   Glossitis: prescribed [facility] magic mouthwash four times daily.  S/p right TKA  I,Alexander Ruley,acting as a scribe for Margaree Mackintosh, MD.,have documented all relevant documentation on the behalf of Margaree Mackintosh, MD,as directed by  Margaree Mackintosh, MD while in the presence of Margaree Mackintosh, MD.   I, Margaree Mackintosh, MD, have reviewed all documentation for this visit. The documentation on 07/27/22 for the exam, diagnosis, procedures, and orders are all accurate and complete.

## 2022-07-20 ENCOUNTER — Other Ambulatory Visit (HOSPITAL_COMMUNITY): Payer: Self-pay

## 2022-07-20 DIAGNOSIS — N189 Chronic kidney disease, unspecified: Secondary | ICD-10-CM | POA: Diagnosis not present

## 2022-07-20 DIAGNOSIS — Z96651 Presence of right artificial knee joint: Secondary | ICD-10-CM | POA: Diagnosis not present

## 2022-07-20 DIAGNOSIS — I129 Hypertensive chronic kidney disease with stage 1 through stage 4 chronic kidney disease, or unspecified chronic kidney disease: Secondary | ICD-10-CM | POA: Diagnosis not present

## 2022-07-20 DIAGNOSIS — I340 Nonrheumatic mitral (valve) insufficiency: Secondary | ICD-10-CM | POA: Diagnosis not present

## 2022-07-20 DIAGNOSIS — Z471 Aftercare following joint replacement surgery: Secondary | ICD-10-CM | POA: Diagnosis not present

## 2022-07-22 ENCOUNTER — Telehealth: Payer: Self-pay | Admitting: Pharmacy Technician

## 2022-07-22 NOTE — Telephone Encounter (Signed)
Patient Advocate Encounter   Received notification that prior authorization for Ubrelvy 100MG  tablets is required.   PA submitted on 07/22/2022 [id number] ePA Form Status is pending

## 2022-07-24 DIAGNOSIS — I129 Hypertensive chronic kidney disease with stage 1 through stage 4 chronic kidney disease, or unspecified chronic kidney disease: Secondary | ICD-10-CM | POA: Diagnosis not present

## 2022-07-24 DIAGNOSIS — N189 Chronic kidney disease, unspecified: Secondary | ICD-10-CM | POA: Diagnosis not present

## 2022-07-24 DIAGNOSIS — I340 Nonrheumatic mitral (valve) insufficiency: Secondary | ICD-10-CM | POA: Diagnosis not present

## 2022-07-24 DIAGNOSIS — Z471 Aftercare following joint replacement surgery: Secondary | ICD-10-CM | POA: Diagnosis not present

## 2022-07-24 DIAGNOSIS — Z96651 Presence of right artificial knee joint: Secondary | ICD-10-CM | POA: Diagnosis not present

## 2022-07-26 ENCOUNTER — Other Ambulatory Visit (HOSPITAL_COMMUNITY): Payer: Self-pay

## 2022-07-26 ENCOUNTER — Ambulatory Visit (INDEPENDENT_AMBULATORY_CARE_PROVIDER_SITE_OTHER): Payer: 59 | Admitting: Orthopaedic Surgery

## 2022-07-26 ENCOUNTER — Encounter: Payer: Self-pay | Admitting: Orthopaedic Surgery

## 2022-07-26 ENCOUNTER — Other Ambulatory Visit: Payer: Self-pay

## 2022-07-26 DIAGNOSIS — Z96651 Presence of right artificial knee joint: Secondary | ICD-10-CM

## 2022-07-26 MED ORDER — HYDROCODONE-ACETAMINOPHEN 5-325 MG PO TABS
1.0000 | ORAL_TABLET | Freq: Four times a day (QID) | ORAL | 0 refills | Status: DC | PRN
  Filled 2022-07-26: qty 30, 4d supply, fill #0

## 2022-07-26 NOTE — Progress Notes (Signed)
The patient is here today for 2 weeks status post a left total knee arthroplasty.  She is ambulating with a rolling walker.  She has been compliant with wearing TED hose and taking a baby aspirin twice daily.  She does need a refill on her oxycodone.  She reports that therapy is making progress with her at home and she understands we need to proceed send outpatient physical therapy with her knee.  On exam the staple line looks good so the staples are removed and Steri-Strips applied.  Calf is soft.  Her extension is full and I can flex her to about 80 degrees.  Will work on setting her up for outpatient therapy soon.  I will see her back in 4 weeks to see how she is doing overall but no x-rays are needed.  I will refill her pain medicine as well.

## 2022-07-26 NOTE — Telephone Encounter (Signed)
Pharmacy Patient Advocate Encounter  Prior Authorization for Ubrelvy 100MG  tablets has been approved by [employer] (ins).    PA # PA Case ID #: [id number] Effective dates: 07/23/2022 through 07/22/2023  Copay is $0 per [facility] test claim.

## 2022-07-27 ENCOUNTER — Other Ambulatory Visit: Payer: Self-pay

## 2022-07-27 ENCOUNTER — Encounter: Payer: Self-pay | Admitting: Internal Medicine

## 2022-07-27 ENCOUNTER — Other Ambulatory Visit (HOSPITAL_COMMUNITY): Payer: Self-pay

## 2022-07-27 DIAGNOSIS — Z471 Aftercare following joint replacement surgery: Secondary | ICD-10-CM | POA: Diagnosis not present

## 2022-07-27 DIAGNOSIS — N189 Chronic kidney disease, unspecified: Secondary | ICD-10-CM | POA: Diagnosis not present

## 2022-07-27 DIAGNOSIS — Z96651 Presence of right artificial knee joint: Secondary | ICD-10-CM

## 2022-07-27 DIAGNOSIS — I340 Nonrheumatic mitral (valve) insufficiency: Secondary | ICD-10-CM | POA: Diagnosis not present

## 2022-07-27 DIAGNOSIS — I129 Hypertensive chronic kidney disease with stage 1 through stage 4 chronic kidney disease, or unspecified chronic kidney disease: Secondary | ICD-10-CM | POA: Diagnosis not present

## 2022-07-27 NOTE — Patient Instructions (Addendum)
Patient will oxycodone 10/325 sparingly for postoperative pain.  Has been prescribed Magic mouthwash for glossitis.  Dr. Magnus Ivan is aware of change in dose of narcotic pain medication.

## 2022-07-28 ENCOUNTER — Other Ambulatory Visit (HOSPITAL_COMMUNITY): Payer: Self-pay

## 2022-07-28 DIAGNOSIS — G4733 Obstructive sleep apnea (adult) (pediatric): Secondary | ICD-10-CM | POA: Diagnosis not present

## 2022-07-30 ENCOUNTER — Other Ambulatory Visit (HOSPITAL_BASED_OUTPATIENT_CLINIC_OR_DEPARTMENT_OTHER): Payer: Self-pay

## 2022-07-31 ENCOUNTER — Ambulatory Visit (INDEPENDENT_AMBULATORY_CARE_PROVIDER_SITE_OTHER): Payer: 59 | Admitting: Physical Therapy

## 2022-07-31 ENCOUNTER — Encounter: Payer: Self-pay | Admitting: Physical Therapy

## 2022-07-31 ENCOUNTER — Other Ambulatory Visit: Payer: Self-pay

## 2022-07-31 DIAGNOSIS — M25661 Stiffness of right knee, not elsewhere classified: Secondary | ICD-10-CM

## 2022-07-31 DIAGNOSIS — M25561 Pain in right knee: Secondary | ICD-10-CM

## 2022-07-31 DIAGNOSIS — R600 Localized edema: Secondary | ICD-10-CM

## 2022-07-31 DIAGNOSIS — R2689 Other abnormalities of gait and mobility: Secondary | ICD-10-CM

## 2022-07-31 DIAGNOSIS — M6281 Muscle weakness (generalized): Secondary | ICD-10-CM | POA: Diagnosis not present

## 2022-07-31 NOTE — Therapy (Signed)
OUTPATIENT PHYSICAL THERAPY EVALUATION   Patient Name: Victoria [name] MRN: [id number] DOB:January 16, 1961, 62 y.o., female Today's Date: 07/31/2022  END OF SESSION:  PT End of Session - 07/31/22 0918     Visit Number 1    Number of Visits 16    Date for PT Re-Evaluation 09/25/22    Authorization Type [facility] [employer] $40 copay    PT Start Time 0845    PT Stop Time 0918    PT Time Calculation (min) 33 min    Activity Tolerance Patient tolerated treatment well    Behavior During Therapy [facility] for tasks assessed/performed             Past Medical History:  Diagnosis Date   Anxiety    Back pain    Bursitis    right shoulder   Chronic kidney disease    hematuria, has been worked up, her normal   Chronic leukopenia    intermittant since 2010, followed by Dr. Lenord Fellers now   Chronic neck pain    Constipation    Degenerative joint disease of low back 09/02/2015   Dr. Despina Hick   Displacement of lumbar intervertebral disc    Dysrhythmia    Tachycardia, PVCs, PACs   Edema of both lower legs    Fibrocystic breast    Gallbladder problem    GERD (gastroesophageal reflux disease)    Headache    per pt more stress/tension   Heart palpitations    SEE EPIC ENCOUTNER , CARDIOLOGY DR. Gloris [place] TURNER 2018; reports on 05-16-17 " i haven't had any bouts of those lately"     Hemorrhoids    Hidradenitis suppurativa    History of Clostridium difficile infection 12/2010   History of exercise intolerance    ETT on 04-27-2016-- negative ([facility] treadmill score 7)   History of Helicobacter pylori infection 2001 and 09/ 2012   HSV-2 infection    genital   Hx of adenomatous colonic polyps 10/17/2007   Hydradenitis    per pt currently treated with Humira injection   Hypertension    Hyperthyroidism    Hypocupremia    Hypothyroidism    Hypothyroidism, postsurgical 1980   IBS (irritable bowel syndrome)    Insomnia    Iron deficiency    Joint pain    Leukopenia    Lumbosacral radiculopathy     Lymphocytic colitis 05/14/2022   dx from colonoscopy   Medial meniscus tear    right knee   Mild obstructive sleep apnea    Mitral regurgitation    Mild to moderate by echo 09/2021   Numbness and tingling of right arm    Obesity    Osteoarthritis    Palpitations    Pernicious anemia    b12 def   Peroneal neuropathy    PONV (postoperative nausea and vomiting)    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   Prediabetes    Reactive hypoglycemia followed by pcp   post gastrectomy dumping syndrome   SOB (shortness of breath)    Spinal stenosis    Tendonitis    right shoulder   Varicose veins    Vertigo    Vitamin B 12 deficiency    Vitamin D deficiency    Past Surgical History:  Procedure Laterality Date   BREATH TEK H PYLORI N/A 05/03/2014   Procedure: BREATH TEK H PYLORI;  Surgeon: Wenda Low, MD;  Location: [facility];  Service: General;  Laterality: N/A;   CARDIOVASCULAR STRESS TEST  03/11/2013  dr Gloris Manchester turner   Low risk nuclear study w/ a very small anterior perfusion defect that most likely represents breast attenuation artifact, less likely this could represent a true perfusion abnormality in the distribution of diagonal branch of the LAD/  normal LV function and wall motion , ef 66%   CATARACT EXTRACTION W/ INTRAOCULAR LENS  IMPLANT, BILATERAL  10/2017   CESAREAN SECTION  1985   CHOLECYSTECTOMY OPEN  1987   COLONOSCOPY  02/2017   Dr Loreta Ave ; per patient they found 7 polyps with polypectomy; on 5 year track    ESOPHAGOGASTRODUODENOSCOPY  02/01/2021   FINE NEEDLE ASPIRATION Left 05/22/2017   Procedure: LEFT KNEE ASPIRATION;  Surgeon: Ollen Gross, MD;  Location: [facility];  Service: Orthopedics;  Laterality: Left;   GASTRIC ROUX-EN-Y N/A 07/06/2014   Procedure: LAPAROSCOPIC ROUX-EN-Y GASTRIC BYPASS, ENTEROLYSIS OF ADHESIONS WITH UPPER ENDOSCOPY;  Surgeon: Luretha Murphy, MD;  Location: [facility];  Service: General;  Laterality: N/A;   HAMMER TOE SURGERY Bilateral 02/17/2008    bilateral foot digit's 2 and 5   HIATAL HERNIA REPAIR N/A 07/06/2014   Procedure: LAPAROSCOPIC REPAIR OF HIATAL HERNIA;  Surgeon: Luretha Murphy, MD;  Location: [facility];  Service: General;  Laterality: N/A;   I & D KNEE WITH POLY EXCHANGE Left 12/11/2019   Procedure: LEFT KNEE POLY-LINER EXCHANGE;  Surgeon: Kathryne Hitch, MD;  Location: [facility];  Service: Orthopedics;  Laterality: Left;   KNEE ARTHROSCOPY Right 12/11/2019   Procedure: RIGHT KNEE ARTHROSCOPY WITH PARTIAL MEDIAL MENISCECTOMY;  Surgeon: Kathryne Hitch, MD;  Location: [facility];  Service: Orthopedics;  Laterality: Right;   KNEE ARTHROSCOPY WITH MEDIAL MENISECTOMY Left 07/17/2017   Procedure: LEFT KNEE ARTHROSCOPY WITH MEDIAL LATERAL MENISECTOMY;  Surgeon: Ollen Gross, MD;  Location: [facility];  Service: Orthopedics;  Laterality: Left;   MASS EXCISION N/A 10/11/2016   Procedure: EXCISION OF PERIANAL MASS;  Surgeon: Romie Levee, MD;  Location: [facility];  Service: General;  Laterality: N/A;   PARTIAL HYSTERECTOMY  2006   has ovaries   REFRACTIVE SURGERY     THYROIDECTOMY  1980   TOTAL ABDOMINAL HYSTERECTOMY  08-25-2002   dr Myrlene Broker   ovaries retained   TOTAL HIP ARTHROPLASTY Right 05/21/2012   Procedure: TOTAL HIP ARTHROPLASTY;  Surgeon: Loanne Drilling, MD;  Location: [facility];  Service: Orthopedics;  Laterality: Right;   TOTAL HIP ARTHROPLASTY Left 01-31-2009  dr Lequita Halt   TOTAL HIP REVISION Left 05/22/2017   Procedure: Left hip bearing surface and head revision;  Surgeon: Ollen Gross, MD;  Location: [facility];  Service: Orthopedics;  Laterality: Left;   TOTAL KNEE ARTHROPLASTY Left 01/20/2018   Procedure: LEFT TOTAL KNEE ARTHROPLASTY;  Surgeon: Ollen Gross, MD;  Location: [facility];  Service: Orthopedics;  Laterality: Left;    TOTAL KNEE ARTHROPLASTY Right 07/13/2022   Procedure: RIGHTTOTAL KNEE ARTHROPLASTY;  Surgeon: Kathryne Hitch, MD;  Location: [facility];  Service:  Orthopedics;  Laterality: Right;   TRANSTHORACIC ECHOCARDIOGRAM  05/03/2016   ef 55-60%/  trivial MR/  mild TR   TUBAL LIGATION     Patient Active Problem List   Diagnosis Date Noted   Status post total right knee replacement 07/13/2022   Weight gain 07/12/2022   Lymphocytic colitis 07/12/2022   Migraine without aura and without status migrainosus, not intractable 07/02/2022   Chronic conjunctivitis of both eyes 06/28/2022   Generalized abdominal pain 05/24/2022   Chronic rhinitis 05/09/2022   Chronic migraine without aura without  status migrainosus, not intractable 12/26/2021   Depressed mood 12/19/2021   Obstructive sleep apnea 12/19/2021   Other hypoglycemia-post bariatric 12/05/2021   Secondary hyperparathyroidism (HCC) 12/05/2021   Inadequate fluid intake 12/05/2021   Inadequate sleep hygiene 12/05/2021   Class 3 severe obesity with body mass index (BMI) of 40.0 to 44.9 in adult [facility]) 12/05/2021   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   SOBOE (shortness of breath on exertion) 11/21/2021   Other fatigue 11/21/2021   Vitamin D deficiency 11/21/2021   Iron deficiency 11/21/2021   Hypoglycemia after bariatric surgery 11/21/2021   Depression screening 11/21/2021   Class 3 severe obesity with serious comorbidity and body mass index (BMI) of 40.0 to 44.9 in adult (HCC) 11/21/2021   Mild obstructive sleep apnea 11/17/2021   Vertigo 11/17/2021   Excessive daytime sleepiness 10/23/2021   Spinal stenosis of lumbar region 07/07/2020   Displacement of lumbar intervertebral disc 06/02/2020   Lumbar spondylosis 06/02/2020   Lumbar stenosis with neurogenic claudication 06/02/2020   Arm numbness 05/30/2020   Chronic neck pain 05/30/2020   Other chronic pain 05/30/2020   Elevated blood-pressure reading, without diagnosis of hypertension 05/30/2020   Status post arthroscopy of right knee 12/11/2019   Polyethylene wear of left knee joint prosthesis (HCC) 09/09/2019   Acute medial  meniscal tear, right, subsequent encounter 08/27/2019   History of total left knee replacement 07/16/2019   Peroneal neuropathy at knee, left 06/25/2019   Lumbosacral radiculopathy 06/07/2019   Iron malabsorption 10/16/2018   IDA (iron deficiency anemia) 10/16/2018   OA (osteoarthritis) of knee 01/20/2018   Acute meniscal tear, medial 07/17/2017   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   Failed total hip arthroplasty, 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/2017   Pain in left knee 04/11/2017   Insomnia 11/08/2016   Pernicious anemia 11/08/2016   Leukopenia 07/09/2016   Hypocupremia 07/09/2016   Palpitations 04/12/2016   Shortness of breath 04/12/2016   Reactive hypoglycemia 10/31/2015   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/31/2015   Lap gastric bypass May 2016 07/06/2014   History of Clostridium difficile infection 08/12/2011   Post-surgical hypothyroidism 08/26/2010   Gastroesophageal reflux disease 08/26/2010   Vitamin D deficiency 08/26/2010   Fibrocystic disease of breast 08/26/2010   Cobalamin deficiency 08/26/2010   Morbid obesity (HCC) 03/20/2010   Backache 03/20/2010    PCP: Margaree Mackintosh, MD  REFERRING PROVIDER: Kathryne Hitch, MD  REFERRING DIAG: [phone] (ICD-10-CM) - Status post total right knee replacement  Rationale for Evaluation and Treatment: Rehabilitation  THERAPY DIAG:  Acute pain of right knee - Plan: PT plan of care cert/re-cert  Stiffness of right knee, not elsewhere classified - Plan: PT plan of care cert/re-cert  Muscle weakness (generalized) - Plan: PT plan of care cert/re-cert  Other abnormalities of gait and mobility - Plan: PT plan of care cert/re-cert  Localized edema - Plan: PT plan of care cert/re-cert  ONSET DATE: 07/13/22 (DOS)   SUBJECTIVE:  SUBJECTIVE  STATEMENT: Pt is s/p Rt TKA on 07/13/22.  She had 4 HHPT visits and is using the RW for ambulation.  PERTINENT HISTORY:  PMH includes: obesity, gastric bypass, L THA revision, L TKA, OSA, spinal stenosis  PAIN:  Are you having pain? Yes: NPRS scale: 8 currently, at best 5, up to 10/10 Pain location: Rt knee Pain description: throbbing, aching, occasional shocks Aggravating factors: end ranges; increased activity Relieving factors: repositioning, icing  PRECAUTIONS:  None  WEIGHT BEARING RESTRICTIONS:  No  FALLS:  Has patient fallen in last 6 months? No  LIVING ENVIRONMENT: Lives with: lives with their spouse Lives in: House/apartment Stairs: Yes: Internal: flight steps; has stair lift and External: 5 steps; can reach both (2 steps in garage; no railing) Has following equipment at home: Retail banker - 2 wheeled  OCCUPATION:  Charity fundraiser at [employer]  PLOF:  Independent and Leisure: walking in the park  PATIENT GOALS:  Regain use of RLE, improve motion in knee   OBJECTIVE:   PATIENT SURVEYS:  07/31/22: FOTO 34 (predicted 51)  COGNITIVE STATUS: Within functional limits for tasks assessed   RED FLAGS: None    SENSATION: 07/31/22: Not tested  POSTURE:  rounded shoulders and forward head   GAIT: 07/31/22 Distance walked: 100' in clinic Assistive device utilized: Environmental consultant - 2 wheeled Level of assistance: Modified independence Comments: antalgic gait with decreased stance on Rt , decreased hip/knee flexion   Knee  LOWER EXTREMITY ROM:     ROM Right eval Left eval  Knee flexion A: 65 P:71 A: 100  Knee extension A: -2 (seated LAQ) P: 0 A: 4 (hyper)   (Blank rows = not tested)   LOWER EXTREMITY MMT:    MMT Right eval Left eval  Knee flexion 3-/5   Knee extension 3-/5    (Blank rows = not tested)   TREATMENT:                                                                                                                              DATE:   07/31/22  See HEP - demonstrated with trial reps PRN for comprehension   PATIENT EDUCATION:  Education details: HEP Person educated: Patient Education method: Explanation, Demonstration, and Handouts Education comprehension: verbalized understanding, returned demonstration, and needs further education  HOME EXERCISE PROGRAM: Access Code: [email or web address] URL: [email or web address] Date: 07/31/2022 Prepared by: Moshe Cipro  Exercises - Seated Knee Flexion AAROM  - 5-10 x daily - 7 x weekly - 1 sets - 5-10 reps - 5 sec hold - Supine Heel Slide with Strap  - 5-10 x daily - 7 x weekly - 1 sets - 5-10 reps - Quad Set  - 5-10 x daily - 7 x weekly - 1 sets - 5-10 reps - 5 sec hold - Seated Long Arc Quad  - 2-3 x daily - 7 x weekly - 1-2 sets - 10 reps - Seated Straight  Leg Raise  - 2-3 x daily - 7 x weekly - 1-2 sets - 10 reps   ASSESSMENT:  CLINICAL IMPRESSION: Patient is a 62 y.o. female who was seen today for physical therapy evaluation and treatment for Rt TKA on 07/13/22. She demonstrates decreased strength ROM and balance, as well as gait abnormalities and expected post op pain and swelling affecting functional mobility.  She will benefit from PT to address deficits listed.    OBJECTIVE IMPAIRMENTS: Abnormal gait, decreased activity tolerance, decreased balance, decreased knowledge of use of DME, decreased mobility, difficulty walking, decreased ROM, decreased strength, hypomobility, increased fascial restrictions, increased muscle spasms, and pain.   ACTIVITY LIMITATIONS: carrying, lifting, bending, sitting, standing, squatting, sleeping, stairs, transfers, and locomotion level  PARTICIPATION LIMITATIONS: meal prep, cleaning, laundry, driving, shopping, community activity, occupation, and yard work  PERSONAL FACTORS: 3+ comorbidities: obesity, gastric bypass, L THA revision, L TKA, OSA, spinal stenosis  are also affecting patient's functional outcome.   REHAB  POTENTIAL: Good  CLINICAL DECISION MAKING: Evolving/moderate complexity  EVALUATION COMPLEXITY: Moderate   GOALS: Goals reviewed with patient? Yes  SHORT TERM GOALS: Target date: 08/28/2022  Independent with initial HEP Goal status: INITIAL  2.  Rt knee AROM improved 0-90 deg for improved function Goal status: INITIAL   LONG TERM GOALS: Target date: 09/25/2022  Independent with final HEP Goal status: INITIAL  2.  FOTO score improved to 51 Goal status: INITIAL  3.  Rt knee AROM improved to 0-100 for improved function and mobility Goal status: INIITAL  4.  Report pain < 3/10 with amb and standing activities for improved function Goal status: INITIAL  5.  Amb with LRAD modified independently for improved function and mobility Goal status: INITIAL    PLAN:  PT FREQUENCY: 2x/week  PT DURATION: 8 weeks  PLANNED INTERVENTIONS: Therapeutic exercises, Therapeutic activity, Neuromuscular re-education, Balance training, Gait training, Patient/Family education, Self Care, Joint mobilization, Stair training, DME instructions, Aquatic Therapy, Dry Needling, Electrical stimulation, Cryotherapy, Moist heat, Taping, Vasopneumatic device, Manual therapy, and Re-evaluation.  PLAN FOR NEXT SESSION: review HEP, aggressive focus on flexion, continue quad activation   NEXT MD VISIT: 09/05/22  Clarita Crane, PT, DPT 07/31/22 9:24 AM

## 2022-08-02 NOTE — Therapy (Signed)
OUTPATIENT PHYSICAL THERAPY TREATMENT NOTE    Patient Name: Victoria [name] MRN: [id number] DOB:06-04-1960, 62 y.o., female Today's Date: 08/03/2022  END OF SESSION:  PT End of Session - 08/03/22 1258     Visit Number 2    Number of Visits 16    Date for PT Re-Evaluation 09/25/22    Authorization Type [facility] [employer] $40 copay    PT Start Time 1300    PT Stop Time 1343    PT Time Calculation (min) 43 min    Activity Tolerance Patient tolerated treatment well;No increased pain    Behavior During Therapy [facility] for tasks assessed/performed              Past Medical History:  Diagnosis Date   Anxiety    Back pain    Bursitis    right shoulder   Chronic kidney disease    hematuria, has been worked up, her normal   Chronic leukopenia    intermittant since 2010, followed by Dr. Lenord Fellers now   Chronic neck pain    Constipation    Degenerative joint disease of low back 09/02/2015   Dr. Despina Hick   Displacement of lumbar intervertebral disc    Dysrhythmia    Tachycardia, PVCs, PACs   Edema of both lower legs    Fibrocystic breast    Gallbladder problem    GERD (gastroesophageal reflux disease)    Headache    per pt more stress/tension   Heart palpitations    SEE EPIC ENCOUTNER , CARDIOLOGY DR. Gloris Manchester TURNER 2018; reports on 05-16-17 " i haven't had any bouts of those lately"     Hemorrhoids    Hidradenitis suppurativa    History of Clostridium difficile infection 12/2010   History of exercise intolerance    ETT on 04-27-2016-- negative ([facility] treadmill score 7)   History of Helicobacter pylori infection 2001 and 09/ 2012   HSV-2 infection    genital   Hx of adenomatous colonic polyps 10/17/2007   Hydradenitis    per pt currently treated with Humira injection   Hypertension    Hyperthyroidism    Hypocupremia    Hypothyroidism    Hypothyroidism, postsurgical 1980   IBS (irritable bowel syndrome)    Insomnia    Iron deficiency    Joint pain    Leukopenia     Lumbosacral radiculopathy    Lymphocytic colitis 05/14/2022   dx from colonoscopy   Medial meniscus tear    right knee   Mild obstructive sleep apnea    Mitral regurgitation    Mild to moderate by echo 09/2021   Numbness and tingling of right arm    Obesity    Osteoarthritis    Palpitations    Pernicious anemia    b12 def   Peroneal neuropathy    PONV (postoperative nausea and vomiting)    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   Prediabetes    Reactive hypoglycemia followed by pcp   post gastrectomy dumping syndrome   SOB (shortness of breath)    Spinal stenosis    Tendonitis    right shoulder   Varicose veins    Vertigo    Vitamin B 12 deficiency    Vitamin D deficiency    Past Surgical History:  Procedure Laterality Date   BREATH TEK H PYLORI N/A 05/03/2014   Procedure: BREATH TEK H PYLORI;  Surgeon: Wenda Low, MD;  Location: [facility];  Service: General;  Laterality: N/A;   CARDIOVASCULAR STRESS  TEST  03/11/2013   dr Gloris Manchester turner   Low risk nuclear study w/ a very small anterior perfusion defect that most likely represents breast attenuation artifact, less likely this could represent a true perfusion abnormality in the distribution of diagonal branch of the LAD/  normal LV function and wall motion , ef 66%   CATARACT EXTRACTION W/ INTRAOCULAR LENS  IMPLANT, BILATERAL  10/2017   CESAREAN SECTION  1985   CHOLECYSTECTOMY OPEN  1987   COLONOSCOPY  02/2017   Dr Loreta Ave ; per patient they found 7 polyps with polypectomy; on 5 year track    ESOPHAGOGASTRODUODENOSCOPY  02/01/2021   FINE NEEDLE ASPIRATION Left 05/22/2017   Procedure: LEFT KNEE ASPIRATION;  Surgeon: Ollen Gross, MD;  Location: [facility];  Service: Orthopedics;  Laterality: Left;   GASTRIC ROUX-EN-Y N/A 07/06/2014   Procedure: LAPAROSCOPIC ROUX-EN-Y GASTRIC BYPASS, ENTEROLYSIS OF ADHESIONS WITH UPPER ENDOSCOPY;  Surgeon: Luretha Murphy, MD;  Location: [facility];  Service: General;  Laterality: N/A;   HAMMER TOE  SURGERY Bilateral 02/17/2008   bilateral foot digit's 2 and 5   HIATAL HERNIA REPAIR N/A 07/06/2014   Procedure: LAPAROSCOPIC REPAIR OF HIATAL HERNIA;  Surgeon: Luretha Murphy, MD;  Location: [facility];  Service: General;  Laterality: N/A;   I & D KNEE WITH POLY EXCHANGE Left 12/11/2019   Procedure: LEFT KNEE POLY-LINER EXCHANGE;  Surgeon: Kathryne Hitch, MD;  Location: [facility];  Service: Orthopedics;  Laterality: Left;   KNEE ARTHROSCOPY Right 12/11/2019   Procedure: RIGHT KNEE ARTHROSCOPY WITH PARTIAL MEDIAL MENISCECTOMY;  Surgeon: Kathryne Hitch, MD;  Location: [facility];  Service: Orthopedics;  Laterality: Right;   KNEE ARTHROSCOPY WITH MEDIAL MENISECTOMY Left 07/17/2017   Procedure: LEFT KNEE ARTHROSCOPY WITH MEDIAL LATERAL MENISECTOMY;  Surgeon: Ollen Gross, MD;  Location: [facility];  Service: Orthopedics;  Laterality: Left;   MASS EXCISION N/A 10/11/2016   Procedure: EXCISION OF PERIANAL MASS;  Surgeon: Romie Levee, MD;  [facility]: [facility];  Service: General;  Laterality: N/A;   PARTIAL HYSTERECTOMY  2006   has ovaries   REFRACTIVE SURGERY     THYROIDECTOMY  1980   TOTAL ABDOMINAL HYSTERECTOMY  08-25-2002   dr Myrlene Broker   ovaries retained   TOTAL HIP ARTHROPLASTY Right 05/21/2012   Procedure: TOTAL HIP ARTHROPLASTY;  Surgeon: Loanne Drilling, MD;  Location: [facility];  Service: Orthopedics;  Laterality: Right;   TOTAL HIP ARTHROPLASTY Left 01-31-2009  dr Lequita Halt   TOTAL HIP REVISION Left 05/22/2017   Procedure: Left hip bearing surface and head revision;  Surgeon: Ollen Gross, MD;  Location: [facility];  Service: Orthopedics;  Laterality: Left;   TOTAL KNEE ARTHROPLASTY Left 01/20/2018   Procedure: LEFT TOTAL KNEE ARTHROPLASTY;  Surgeon: Ollen Gross, MD;  Location: [facility];  Service: Orthopedics;  Laterality: Left;    TOTAL KNEE ARTHROPLASTY Right 07/13/2022   Procedure: RIGHTTOTAL KNEE ARTHROPLASTY;  Surgeon: Kathryne Hitch, MD;  Location:  [facility];  Service: Orthopedics;  Laterality: Right;   TRANSTHORACIC ECHOCARDIOGRAM  05/03/2016   ef 55-60%/  trivial MR/  mild TR   TUBAL LIGATION     Patient Active Problem List   Diagnosis Date Noted   Status post total right knee replacement 07/13/2022   Weight gain 07/12/2022   Lymphocytic colitis 07/12/2022   Migraine without aura and without status migrainosus, not intractable 07/02/2022   Chronic conjunctivitis of both eyes 06/28/2022   Generalized abdominal pain 05/24/2022   Chronic rhinitis 05/09/2022  Chronic migraine without aura without status migrainosus, not intractable 12/26/2021   Depressed mood 12/19/2021   Obstructive sleep apnea 12/19/2021   Other hypoglycemia-post bariatric 12/05/2021   Secondary hyperparathyroidism (HCC) 12/05/2021   Inadequate fluid intake 12/05/2021   Inadequate sleep hygiene 12/05/2021   Class 3 severe obesity with body mass index (BMI) of 40.0 to 44.9 in adult [facility]) 12/05/2021   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   SOBOE (shortness of breath on exertion) 11/21/2021   Other fatigue 11/21/2021   Vitamin D deficiency 11/21/2021   Iron deficiency 11/21/2021   Hypoglycemia after bariatric surgery 11/21/2021   Depression screening 11/21/2021   Class 3 severe obesity with serious comorbidity and body mass index (BMI) of 40.0 to 44.9 in adult (HCC) 11/21/2021   Mild obstructive sleep apnea 11/17/2021   Vertigo 11/17/2021   Excessive daytime sleepiness 10/23/2021   Spinal stenosis of lumbar region 07/07/2020   Displacement of lumbar intervertebral disc 06/02/2020   Lumbar spondylosis 06/02/2020   Lumbar stenosis with neurogenic claudication 06/02/2020   Arm numbness 05/30/2020   Chronic neck pain 05/30/2020   Other chronic pain 05/30/2020   Elevated blood-pressure reading, without diagnosis of hypertension 05/30/2020   Status post arthroscopy of right knee 12/11/2019   Polyethylene wear of left knee joint prosthesis (HCC)  09/09/2019   Acute medial meniscal tear, right, subsequent encounter 08/27/2019   History of total left knee replacement 07/16/2019   Peroneal neuropathy at knee, left 06/25/2019   Lumbosacral radiculopathy 06/07/2019   Iron malabsorption 10/16/2018   IDA (iron deficiency anemia) 10/16/2018   OA (osteoarthritis) of knee 01/20/2018   Acute meniscal tear, medial 07/17/2017   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   Failed total hip arthroplasty, 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/2017   Pain in left knee 04/11/2017   Insomnia 11/08/2016   Pernicious anemia 11/08/2016   Leukopenia 07/09/2016   Hypocupremia 07/09/2016   Palpitations 04/12/2016   Shortness of breath 04/12/2016   Reactive hypoglycemia 10/31/2015   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/31/2015   Lap gastric bypass May 2016 07/06/2014   History of Clostridium difficile infection 08/12/2011   Post-surgical hypothyroidism 08/26/2010   Gastroesophageal reflux disease 08/26/2010   Vitamin D deficiency 08/26/2010   Fibrocystic disease of breast 08/26/2010   Cobalamin deficiency 08/26/2010   Morbid obesity (HCC) 03/20/2010   Backache 03/20/2010    PCP: Margaree Mackintosh, MD  REFERRING PROVIDER: Kathryne Hitch, MD  REFERRING DIAG: [phone] (ICD-10-CM) - Status post total right knee replacement  Rationale for Evaluation and Treatment: Rehabilitation  THERAPY DIAG:  Acute pain of right knee  Stiffness of right knee, not elsewhere classified  Muscle weakness (generalized)  Other abnormalities of gait and mobility  Localized edema  ONSET DATE: 07/13/22 (DOS)   SUBJECTIVE:  SUBJECTIVE STATEMENT: Endorses 8/10 pain at present, arrives w/ RW. States her symptoms were fairly aggravated after last session although she notes that she has been doing a  lot of walking out in community which she thinks may be contributing to aggravation  PERTINENT HISTORY:  PMH includes: obesity, gastric bypass, L THA revision, L TKA, OSA, spinal stenosis  PAIN:  Are you having pain? Yes: NPRS scale: 8 currently, at best 5, up to 10/10 Pain location: Rt knee Pain description: throbbing, aching, occasional shocks Aggravating factors: end ranges; increased activity Relieving factors: repositioning, icing  PRECAUTIONS:  None  WEIGHT BEARING RESTRICTIONS:  No  FALLS:  Has patient fallen in last 6 months? No  LIVING ENVIRONMENT: Lives with: lives with their spouse Lives in: House/apartment Stairs: Yes: Internal: flight steps; has stair lift and External: 5 steps; can reach both (2 steps in garage; no railing) Has following equipment at home: Retail banker - 2 wheeled  OCCUPATION:  Charity fundraiser at [employer]  PLOF:  Independent and Leisure: walking in the park  PATIENT GOALS:  Regain use of RLE, improve motion in knee   OBJECTIVE: (objective measures completed at initial evaluation unless otherwise dated)   PATIENT SURVEYS:  07/31/22: FOTO 34 (predicted 51)  COGNITIVE STATUS: Within functional limits for tasks assessed   RED FLAGS: None    SENSATION: 07/31/22: Not tested  POSTURE:  rounded shoulders and forward head   GAIT: 07/31/22 Distance walked: 100' in clinic Assistive device utilized: Environmental consultant - 2 wheeled Level of assistance: Modified independence Comments: antalgic gait with decreased stance on Rt , decreased hip/knee flexion   Knee  LOWER EXTREMITY ROM:     ROM Right eval Left eval Right LE ROM 08/03/22  Knee flexion A: 65 P:71 A: 100 Seated edge of mat, post manual  A: 79 deg P: 82 deg   Knee extension A: -2 (seated LAQ) P: 0 A: 4 (hyper) 0 deg A/PROM with mild pain at end range   (Blank rows = not tested)   LOWER EXTREMITY MMT:    MMT Right eval Left eval  Knee flexion 3-/5   Knee extension 3-/5     (Blank rows = not tested)   TREATMENT:                                                                                                                              DATE:  [facility] Adult PT Treatment:                                                DATE: 08/03/22 Therapeutic Exercise: Seated knee flexion AAROM strap and towel x12 w 5 sec hold, cues for appropriate ROM Seated LAQ 2x10 cues for full ROM and pacing  Standing RLE weight shifts with gentle knee bend, RW  support 2x8 Verbal HEP review + education on strategies to maximize adherence/tolerance, pacing of activities outside of sessions to improve exercise adherence/tolerance  Manual Therapy: Seated edge of mat; knee flex/ext passive physiological movement  with gentle over pressure to pt tolerance, gentle distraction for increased ROM into flexion. Gentle oscillations to reduce muscle guarding    PATIENT EDUCATION:  Education details: rationale for interventions, HEP  Person educated: Patient Education method: Explanation, Demonstration, and Handouts Education comprehension: verbalized understanding, returned demonstration, and needs further education  HOME EXERCISE PROGRAM: Access Code: [email or web address] URL: [email or web address] Date: 07/31/2022 Prepared by: Moshe Cipro  Exercises - Seated Knee Flexion AAROM  - 5-10 x daily - 7 x weekly - 1 sets - 5-10 reps - 5 sec hold - Supine Heel Slide with Strap  - 5-10 x daily - 7 x weekly - 1 sets - 5-10 reps - Quad Set  - 5-10 x daily - 7 x weekly - 1 sets - 5-10 reps - 5 sec hold - Seated Long Arc Quad  - 2-3 x daily - 7 x weekly - 1-2 sets - 10 reps - Seated Straight Leg Raise  - 2-3 x daily - 7 x weekly - 1-2 sets - 10 reps   ASSESSMENT:  CLINICAL IMPRESSION: 08/03/2022 Pt arrives w/ 8/10 pain on NPS, continues to endorse expected swelling/pain although notes that increased activity in community may be aggravating things. Today spending increased time with manual to  improve tissue extensibility and reduce muscle guarding, noted improvements in active/passive ROM compared to initial eval post manual. This is followed with exercises working on antigravity quad strength and knee flexion ROM in open and closed chain which pt tolerates well, mild discomfort as expected but non-worsening. No adverse events, pt departs with report of mild improvement in pain (6-7/10) and reduced stiffness, improved symmetry of weightbearing with gait and static stance. Increased time spent w/ education on HEP to maximize adherence as she reports some difficulty due to home setup and symptom irritability w/ community navigation. Continues with post op deficits as expected, recommend skilled PT to address. Pt departs today's session in no acute distress, all voiced questions/concerns addressed appropriately from PT perspective.      Per eval - Patient is a 62 y.o. female who was seen today for physical therapy evaluation and treatment for Rt TKA on 07/13/22. She demonstrates decreased strength ROM and balance, as well as gait abnormalities and expected post op pain and swelling affecting functional mobility.  She will benefit from PT to address deficits listed.    OBJECTIVE IMPAIRMENTS: Abnormal gait, decreased activity tolerance, decreased balance, decreased knowledge of use of DME, decreased mobility, difficulty walking, decreased ROM, decreased strength, hypomobility, increased fascial restrictions, increased muscle spasms, and pain.   ACTIVITY LIMITATIONS: carrying, lifting, bending, sitting, standing, squatting, sleeping, stairs, transfers, and locomotion level  PARTICIPATION LIMITATIONS: meal prep, cleaning, laundry, driving, shopping, community activity, occupation, and yard work  PERSONAL FACTORS: 3+ comorbidities: obesity, gastric bypass, L THA revision, L TKA, OSA, spinal stenosis  are also affecting patient's functional outcome.   REHAB POTENTIAL: Good  CLINICAL DECISION MAKING:  Evolving/moderate complexity  EVALUATION COMPLEXITY: Moderate   GOALS: Goals reviewed with patient? Yes  SHORT TERM GOALS: Target date: 08/28/2022  Independent with initial HEP Goal status: INITIAL  2.  Rt knee AROM improved 0-90 deg for improved function Goal status: INITIAL   LONG TERM GOALS: Target date: 09/25/2022  Independent with final HEP Goal status: INITIAL  2.  FOTO score improved  to 51 Goal status: INITIAL  3.  Rt knee AROM improved to 0-100 for improved function and mobility Goal status: INIITAL  4.  Report pain < 3/10 with amb and standing activities for improved function Goal status: INITIAL  5.  Amb with LRAD modified independently for improved function and mobility Goal status: INITIAL    PLAN:  PT FREQUENCY: 2x/week  PT DURATION: 8 weeks  PLANNED INTERVENTIONS: Therapeutic exercises, Therapeutic activity, Neuromuscular re-education, Balance training, Gait training, Patient/Family education, Self Care, Joint mobilization, Stair training, DME instructions, Aquatic Therapy, Dry Needling, Electrical stimulation, Cryotherapy, Moist heat, Taping, Vasopneumatic device, Manual therapy, and Re-evaluation.  PLAN FOR NEXT SESSION: review HEP, aggressive focus on flexion, continue quad activation. Gait mechanics and WB symmetry as appropriate     NEXT MD VISIT: 09/05/22  Ashley Murrain PT, DPT 08/03/2022 1:56 PM

## 2022-08-03 ENCOUNTER — Ambulatory Visit: Payer: 59 | Admitting: Physical Therapy

## 2022-08-03 ENCOUNTER — Encounter: Payer: Self-pay | Admitting: Physical Therapy

## 2022-08-03 DIAGNOSIS — R2689 Other abnormalities of gait and mobility: Secondary | ICD-10-CM | POA: Diagnosis not present

## 2022-08-03 DIAGNOSIS — M25561 Pain in right knee: Secondary | ICD-10-CM

## 2022-08-03 DIAGNOSIS — R600 Localized edema: Secondary | ICD-10-CM

## 2022-08-03 DIAGNOSIS — M25661 Stiffness of right knee, not elsewhere classified: Secondary | ICD-10-CM | POA: Diagnosis not present

## 2022-08-03 DIAGNOSIS — M6281 Muscle weakness (generalized): Secondary | ICD-10-CM | POA: Diagnosis not present

## 2022-08-06 ENCOUNTER — Ambulatory Visit: Payer: 59 | Admitting: Physical Therapy

## 2022-08-06 ENCOUNTER — Encounter: Payer: Self-pay | Admitting: Physical Therapy

## 2022-08-06 ENCOUNTER — Encounter: Payer: Self-pay | Admitting: Orthopaedic Surgery

## 2022-08-06 ENCOUNTER — Other Ambulatory Visit: Payer: Self-pay | Admitting: Orthopaedic Surgery

## 2022-08-06 ENCOUNTER — Other Ambulatory Visit (HOSPITAL_COMMUNITY): Payer: Self-pay

## 2022-08-06 ENCOUNTER — Other Ambulatory Visit: Payer: Self-pay

## 2022-08-06 DIAGNOSIS — R2689 Other abnormalities of gait and mobility: Secondary | ICD-10-CM

## 2022-08-06 DIAGNOSIS — M6281 Muscle weakness (generalized): Secondary | ICD-10-CM

## 2022-08-06 DIAGNOSIS — M25561 Pain in right knee: Secondary | ICD-10-CM

## 2022-08-06 DIAGNOSIS — R600 Localized edema: Secondary | ICD-10-CM

## 2022-08-06 DIAGNOSIS — M25661 Stiffness of right knee, not elsewhere classified: Secondary | ICD-10-CM | POA: Diagnosis not present

## 2022-08-06 MED ORDER — OXYCODONE HCL 5 MG PO TABS
5.0000 mg | ORAL_TABLET | Freq: Four times a day (QID) | ORAL | 0 refills | Status: DC | PRN
  Filled 2022-08-06: qty 30, 4d supply, fill #0

## 2022-08-06 NOTE — Progress Notes (Signed)
We need to go back to oxycodone from the hydrocodone in order to control the patient's pain status post a total knee arthroplasty.  This will help push her through physical therapy hopefully with better pain control.

## 2022-08-06 NOTE — Therapy (Signed)
OUTPATIENT PHYSICAL THERAPY TREATMENT NOTE    Patient Name: Victoria [name] MRN: [id number] DOB:1960/04/19, 62 y.o., female Today's Date: 08/06/2022  END OF SESSION:  PT End of Session - 08/06/22 1505     Visit Number 3    Number of Visits 16    Date for PT Re-Evaluation 09/25/22    Authorization Type [facility] [employer] $40 copay    PT Start Time 1500    PT Stop Time 1530    PT Time Calculation (min) 30 min    Activity Tolerance Patient tolerated treatment well;No increased pain    Behavior During Therapy [facility] for tasks assessed/performed               Past Medical History:  Diagnosis Date   Anxiety    Back pain    Bursitis    right shoulder   Chronic kidney disease    hematuria, has been worked up, her normal   Chronic leukopenia    intermittant since 2010, followed by Dr. Lenord Fellers now   Chronic neck pain    Constipation    Degenerative joint disease of low back 09/02/2015   Dr. Despina Hick   Displacement of lumbar intervertebral disc    Dysrhythmia    Tachycardia, PVCs, PACs   Edema of both lower legs    Fibrocystic breast    Gallbladder problem    GERD (gastroesophageal reflux disease)    Headache    per pt more stress/tension   Heart palpitations    SEE EPIC ENCOUTNER , CARDIOLOGY DR. Gloris Manchester TURNER 2018; reports on 05-16-17 " i haven't had any bouts of those lately"     Hemorrhoids    Hidradenitis suppurativa    History of Clostridium difficile infection 12/2010   History of exercise intolerance    ETT on 04-27-2016-- negative ([facility] treadmill score 7)   History of Helicobacter pylori infection 2001 and 09/ 2012   HSV-2 infection    genital   Hx of adenomatous colonic polyps 10/17/2007   Hydradenitis    per pt currently treated with Humira injection   Hypertension    Hyperthyroidism    Hypocupremia    Hypothyroidism    Hypothyroidism, postsurgical 1980   IBS (irritable bowel syndrome)    Insomnia    Iron deficiency    Joint pain    Leukopenia     Lumbosacral radiculopathy    Lymphocytic colitis 05/14/2022   dx from colonoscopy   Medial meniscus tear    right knee   Mild obstructive sleep apnea    Mitral regurgitation    Mild to moderate by echo 09/2021   Numbness and tingling of right arm    Obesity    Osteoarthritis    Palpitations    Pernicious anemia    b12 def   Peroneal neuropathy    PONV (postoperative nausea and vomiting)    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   Prediabetes    Reactive hypoglycemia followed by pcp   post gastrectomy dumping syndrome   SOB (shortness of breath)    Spinal stenosis    Tendonitis    right shoulder   Varicose veins    Vertigo    Vitamin B 12 deficiency    Vitamin D deficiency    Past Surgical History:  Procedure Laterality Date   BREATH TEK H PYLORI N/A 05/03/2014   Procedure: BREATH TEK H PYLORI;  Surgeon: Wenda Low, MD;  Location: [facility];  Service: General;  Laterality: N/A;   CARDIOVASCULAR  STRESS TEST  03/11/2013   dr Gloris Manchester turner   Low risk nuclear study w/ a very small anterior perfusion defect that most likely represents breast attenuation artifact, less likely this could represent a true perfusion abnormality in the distribution of diagonal branch of the LAD/  normal LV function and wall motion , ef 66%   CATARACT EXTRACTION W/ INTRAOCULAR LENS  IMPLANT, BILATERAL  10/2017   CESAREAN SECTION  1985   CHOLECYSTECTOMY OPEN  1987   COLONOSCOPY  02/2017   Dr Loreta Ave ; per patient they found 7 polyps with polypectomy; on 5 year track    ESOPHAGOGASTRODUODENOSCOPY  02/01/2021   FINE NEEDLE ASPIRATION Left 05/22/2017   Procedure: LEFT KNEE ASPIRATION;  Surgeon: Ollen Gross, MD;  Location: [facility];  Service: Orthopedics;  Laterality: Left;   GASTRIC ROUX-EN-Y N/A 07/06/2014   Procedure: LAPAROSCOPIC ROUX-EN-Y GASTRIC BYPASS, ENTEROLYSIS OF ADHESIONS WITH UPPER ENDOSCOPY;  Surgeon: Luretha Murphy, MD;  Location: [facility];  Service: General;  Laterality: N/A;   HAMMER TOE  SURGERY Bilateral 02/17/2008   bilateral foot digit's 2 and 5   HIATAL HERNIA REPAIR N/A 07/06/2014   Procedure: LAPAROSCOPIC REPAIR OF HIATAL HERNIA;  Surgeon: Luretha Murphy, MD;  Location: [facility];  Service: General;  Laterality: N/A;   I & D KNEE WITH POLY EXCHANGE Left 12/11/2019   Procedure: LEFT KNEE POLY-LINER EXCHANGE;  Surgeon: Kathryne Hitch, MD;  Location: [facility];  Service: Orthopedics;  Laterality: Left;   KNEE ARTHROSCOPY Right 12/11/2019   Procedure: RIGHT KNEE ARTHROSCOPY WITH PARTIAL MEDIAL MENISCECTOMY;  Surgeon: Kathryne Hitch, MD;  Location: [facility];  Service: Orthopedics;  Laterality: Right;   KNEE ARTHROSCOPY WITH MEDIAL MENISECTOMY Left 07/17/2017   Procedure: LEFT KNEE ARTHROSCOPY WITH MEDIAL LATERAL MENISECTOMY;  Surgeon: Ollen Gross, MD;  Location: [facility];  Service: Orthopedics;  Laterality: Left;   MASS EXCISION N/A 10/11/2016   Procedure: EXCISION OF PERIANAL MASS;  Surgeon: Romie Levee, MD;  Location: [facility];  Service: General;  Laterality: N/A;   PARTIAL HYSTERECTOMY  2006   has ovaries   REFRACTIVE SURGERY     THYROIDECTOMY  1980   TOTAL ABDOMINAL HYSTERECTOMY  08-25-2002   dr Myrlene Broker   ovaries retained   TOTAL HIP ARTHROPLASTY Right 05/21/2012   Procedure: TOTAL HIP ARTHROPLASTY;  Surgeon: Loanne Drilling, MD;  Location: [facility];  Service: Orthopedics;  Laterality: Right;   TOTAL HIP ARTHROPLASTY Left 01-31-2009  dr Lequita Halt   TOTAL HIP REVISION Left 05/22/2017   Procedure: Left hip bearing surface and head revision;  Surgeon: Ollen Gross, MD;  Location: [facility];  Service: Orthopedics;  Laterality: Left;   TOTAL KNEE ARTHROPLASTY Left 01/20/2018   Procedure: LEFT TOTAL KNEE ARTHROPLASTY;  Surgeon: Ollen Gross, MD;  Location: [facility];  Service: Orthopedics;  Laterality: Left;    TOTAL KNEE ARTHROPLASTY Right 07/13/2022   Procedure: RIGHTTOTAL KNEE ARTHROPLASTY;  Surgeon: Kathryne Hitch, MD;  Location:  [facility];  Service: Orthopedics;  Laterality: Right;   TRANSTHORACIC ECHOCARDIOGRAM  05/03/2016   ef 55-60%/  trivial MR/  mild TR   TUBAL LIGATION     Patient Active Problem List   Diagnosis Date Noted   Status post total right knee replacement 07/13/2022   Weight gain 07/12/2022   Lymphocytic colitis 07/12/2022   Migraine without aura and without status migrainosus, not intractable 07/02/2022   Chronic conjunctivitis of both eyes 06/28/2022   Generalized abdominal pain 05/24/2022   Chronic rhinitis 05/09/2022  Chronic migraine without aura without status migrainosus, not intractable 12/26/2021   Depressed mood 12/19/2021   Obstructive sleep apnea 12/19/2021   Other hypoglycemia-post bariatric 12/05/2021   Secondary hyperparathyroidism (HCC) 12/05/2021   Inadequate fluid intake 12/05/2021   Inadequate sleep hygiene 12/05/2021   Class 3 severe obesity with body mass index (BMI) of 40.0 to 44.9 in adult [facility]) 12/05/2021   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   SOBOE (shortness of breath on exertion) 11/21/2021   Other fatigue 11/21/2021   Vitamin D deficiency 11/21/2021   Iron deficiency 11/21/2021   Hypoglycemia after bariatric surgery 11/21/2021   Depression screening 11/21/2021   Class 3 severe obesity with serious comorbidity and body mass index (BMI) of 40.0 to 44.9 in adult (HCC) 11/21/2021   Mild obstructive sleep apnea 11/17/2021   Vertigo 11/17/2021   Excessive daytime sleepiness 10/23/2021   Spinal stenosis of lumbar region 07/07/2020   Displacement of lumbar intervertebral disc 06/02/2020   Lumbar spondylosis 06/02/2020   Lumbar stenosis with neurogenic claudication 06/02/2020   Arm numbness 05/30/2020   Chronic neck pain 05/30/2020   Other chronic pain 05/30/2020   Elevated blood-pressure reading, without diagnosis of hypertension 05/30/2020   Status post arthroscopy of right knee 12/11/2019   Polyethylene wear of left knee joint prosthesis (HCC)  09/09/2019   Acute medial meniscal tear, right, subsequent encounter 08/27/2019   History of total left knee replacement 07/16/2019   Peroneal neuropathy at knee, left 06/25/2019   Lumbosacral radiculopathy 06/07/2019   Iron malabsorption 10/16/2018   IDA (iron deficiency anemia) 10/16/2018   OA (osteoarthritis) of knee 01/20/2018   Acute meniscal tear, medial 07/17/2017   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   Failed total hip arthroplasty, 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/2017   Pain in left knee 04/11/2017   Insomnia 11/08/2016   Pernicious anemia 11/08/2016   Leukopenia 07/09/2016   Hypocupremia 07/09/2016   Palpitations 04/12/2016   Shortness of breath 04/12/2016   Reactive hypoglycemia 10/31/2015   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/31/2015   Lap gastric bypass May 2016 07/06/2014   History of Clostridium difficile infection 08/12/2011   Post-surgical hypothyroidism 08/26/2010   Gastroesophageal reflux disease 08/26/2010   Vitamin D deficiency 08/26/2010   Fibrocystic disease of breast 08/26/2010   Cobalamin deficiency 08/26/2010   Morbid obesity (HCC) 03/20/2010   Backache 03/20/2010    PCP: Margaree Mackintosh, MD  REFERRING PROVIDER: Kathryne Hitch, MD  REFERRING DIAG: [phone] (ICD-10-CM) - Status post total right knee replacement  Rationale for Evaluation and Treatment: Rehabilitation  THERAPY DIAG:  Acute pain of right knee  Stiffness of right knee, not elsewhere classified  Muscle weakness (generalized)  Other abnormalities of gait and mobility  Localized edema  ONSET DATE: 07/13/22 (DOS)   SUBJECTIVE:  SUBJECTIVE STATEMENT: Having a lot of pain today; wrong Rx called into pharmacy so had to go all weekend without pain meds.    PERTINENT HISTORY:  PMH includes: obesity,  gastric bypass, L THA revision, L TKA, OSA, spinal stenosis  PAIN:  Are you having pain? Yes: NPRS scale: "10 plus" currently, at best 5, up to 10/10 Pain location: Rt knee Pain description: throbbing, aching, occasional shocks Aggravating factors: end ranges; increased activity Relieving factors: repositioning, icing  PRECAUTIONS:  None  WEIGHT BEARING RESTRICTIONS:  No  FALLS:  Has patient fallen in last 6 months? No  LIVING ENVIRONMENT: Lives with: lives with their spouse Lives in: House/apartment Stairs: Yes: Internal: flight steps; has stair lift and External: 5 steps; can reach both (2 steps in garage; no railing) Has following equipment at home: Retail banker - 2 wheeled  OCCUPATION:  Charity fundraiser at [employer]  PLOF:  Independent and Leisure: walking in the park  PATIENT GOALS:  Regain use of RLE, improve motion in knee   OBJECTIVE: (objective measures completed at initial evaluation unless otherwise dated)   PATIENT SURVEYS:  07/31/22: FOTO 34 (predicted 51)  COGNITIVE STATUS: Within functional limits for tasks assessed   RED FLAGS: None    SENSATION: 07/31/22: Not tested  POSTURE:  rounded shoulders and forward head   GAIT: 07/31/22 Distance walked: 100' in clinic Assistive device utilized: Environmental consultant - 2 wheeled Level of assistance: Modified independence Comments: antalgic gait with decreased stance on Rt , decreased hip/knee flexion   Knee  LOWER EXTREMITY ROM:     ROM Right eval Left eval Right LE ROM 08/03/22 Right   Knee flexion A: 65 P:71 A: 100 Seated edge of mat, post manual  A: 79 deg P: 82 deg  A: 77 P: 86  Knee extension A: -2 (seated LAQ) P: 0 A: 4 (hyper) 0 deg A/PROM with mild pain at end range A: 0  (seated LAQ)   (Blank rows = not tested)   LOWER EXTREMITY MMT:    MMT Right eval Left eval  Knee flexion 3-/5   Knee extension 3-/5    (Blank rows = not tested)   TREATMENT:                                                                                                                               DATE:  08/06/22 TherEx SciFit bike seat 15; partial revolutions x 8 min Tailgate flexion x 3 min Seated LAQ 2x10 with 1#; 5 sec hold ROM measurements - see above for details Supine AA heel slides 2x10   08/03/22 Therapeutic Exercise: Seated knee flexion AAROM strap and towel x12 w 5 sec hold, cues for appropriate ROM Seated LAQ 2x10 cues for full ROM and pacing  Standing RLE weight shifts with gentle knee bend, RW support 2x8 Verbal HEP review + education on strategies to maximize adherence/tolerance, pacing of activities outside of sessions to improve  exercise adherence/tolerance  Manual Therapy: Seated edge of mat; knee flex/ext passive physiological movement  with gentle over pressure to pt tolerance, gentle distraction for increased ROM into flexion. Gentle oscillations to reduce muscle guarding    PATIENT EDUCATION:  Education details: rationale for interventions, HEP  Person educated: Patient Education method: Explanation, Demonstration, and Handouts Education comprehension: verbalized understanding, returned demonstration, and needs further education  HOME EXERCISE PROGRAM: Access Code: [email or web address] URL: [email or web address] Date: 07/31/2022 Prepared by: Moshe Cipro  Exercises - Seated Knee Flexion AAROM  - 5-10 x daily - 7 x weekly - 1 sets - 5-10 reps - 5 sec hold - Supine Heel Slide with Strap  - 5-10 x daily - 7 x weekly - 1 sets - 5-10 reps - Quad Set  - 5-10 x daily - 7 x weekly - 1 sets - 5-10 reps - 5 sec hold - Seated Long Arc Quad  - 2-3 x daily - 7 x weekly - 1-2 sets - 10 reps - Seated Straight Leg Raise  - 2-3 x daily - 7 x weekly - 1-2 sets - 10 reps   ASSESSMENT:  CLINICAL IMPRESSION: 08/06/2022 Limited session today due to high pain reports and limited tolerance to activity.  AROM appears stable despite elevated pain at this time.  Will continue to  monitor and progress as able.  Will continue to benefit from PT to maximize function.   Per eval - Patient is a 62 y.o. female who was seen today for physical therapy evaluation and treatment for Rt TKA on 07/13/22. She demonstrates decreased strength ROM and balance, as well as gait abnormalities and expected post op pain and swelling affecting functional mobility.  She will benefit from PT to address deficits listed.    OBJECTIVE IMPAIRMENTS: Abnormal gait, decreased activity tolerance, decreased balance, decreased knowledge of use of DME, decreased mobility, difficulty walking, decreased ROM, decreased strength, hypomobility, increased fascial restrictions, increased muscle spasms, and pain.   ACTIVITY LIMITATIONS: carrying, lifting, bending, sitting, standing, squatting, sleeping, stairs, transfers, and locomotion level  PARTICIPATION LIMITATIONS: meal prep, cleaning, laundry, driving, shopping, community activity, occupation, and yard work  PERSONAL FACTORS: 3+ comorbidities: obesity, gastric bypass, L THA revision, L TKA, OSA, spinal stenosis  are also affecting patient's functional outcome.   REHAB POTENTIAL: Good  CLINICAL DECISION MAKING: Evolving/moderate complexity  EVALUATION COMPLEXITY: Moderate   GOALS: Goals reviewed with patient? Yes  SHORT TERM GOALS: Target date: 08/28/2022  Independent with initial HEP Goal status: INITIAL  2.  Rt knee AROM improved 0-90 deg for improved function Goal status: INITIAL   LONG TERM GOALS: Target date: 09/25/2022  Independent with final HEP Goal status: INITIAL  2.  FOTO score improved to 51 Goal status: INITIAL  3.  Rt knee AROM improved to 0-100 for improved function and mobility Goal status: INIITAL  4.  Report pain < 3/10 with amb and standing activities for improved function Goal status: INITIAL  5.  Amb with LRAD modified independently for improved function and mobility Goal status: INITIAL    PLAN:  PT FREQUENCY:  2x/week  PT DURATION: 8 weeks  PLANNED INTERVENTIONS: Therapeutic exercises, Therapeutic activity, Neuromuscular re-education, Balance training, Gait training, Patient/Family education, Self Care, Joint mobilization, Stair training, DME instructions, Aquatic Therapy, Dry Needling, Electrical stimulation, Cryotherapy, Moist heat, Taping, Vasopneumatic device, Manual therapy, and Re-evaluation.  PLAN FOR NEXT SESSION: monitor pain levels, aggressive focus on flexion as pt can tolerate, continue quad activation. Gait mechanics and WB symmetry as appropriate  NEXT MD VISIT: 09/05/22  Ashley Murrain PT, DPT 08/06/2022 3:33 PM

## 2022-08-10 ENCOUNTER — Encounter: Payer: Self-pay | Admitting: Physical Therapy

## 2022-08-10 ENCOUNTER — Ambulatory Visit: Payer: 59 | Admitting: Physical Therapy

## 2022-08-10 DIAGNOSIS — R600 Localized edema: Secondary | ICD-10-CM

## 2022-08-10 DIAGNOSIS — M6281 Muscle weakness (generalized): Secondary | ICD-10-CM | POA: Diagnosis not present

## 2022-08-10 DIAGNOSIS — M25661 Stiffness of right knee, not elsewhere classified: Secondary | ICD-10-CM

## 2022-08-10 DIAGNOSIS — M25561 Pain in right knee: Secondary | ICD-10-CM | POA: Diagnosis not present

## 2022-08-10 DIAGNOSIS — R2689 Other abnormalities of gait and mobility: Secondary | ICD-10-CM | POA: Diagnosis not present

## 2022-08-10 NOTE — Therapy (Signed)
OUTPATIENT PHYSICAL THERAPY TREATMENT NOTE    Patient Name: Victoria [name] MRN: [id number] DOB:1960/06/16, 62 y.o., female Today's Date: 08/10/2022  END OF SESSION:  PT End of Session - 08/10/22 0925     Visit Number 4    Number of Visits 16    Date for PT Re-Evaluation 09/25/22    Authorization Type [facility] [employer] $40 copay    PT Start Time 0920    PT Stop Time 1010    PT Time Calculation (min) 50 min    Activity Tolerance Patient tolerated treatment well;No increased pain    Behavior During Therapy [facility] for tasks assessed/performed                Past Medical History:  Diagnosis Date   Anxiety    Back pain    Bursitis    right shoulder   Chronic kidney disease    hematuria, has been worked up, her normal   Chronic leukopenia    intermittant since 2010, followed by Dr. Lenord Fellers now   Chronic neck pain    Constipation    Degenerative joint disease of low back 09/02/2015   Dr. Despina Hick   Displacement of lumbar intervertebral disc    Dysrhythmia    Tachycardia, PVCs, PACs   Edema of both lower legs    Fibrocystic breast    Gallbladder problem    GERD (gastroesophageal reflux disease)    Headache    per pt more stress/tension   Heart palpitations    SEE EPIC ENCOUTNER , CARDIOLOGY DR. Gloris Manchester TURNER 2018; reports on 05-16-17 " i haven't had any bouts of those lately"     Hemorrhoids    Hidradenitis suppurativa    History of Clostridium difficile infection 12/2010   History of exercise intolerance    ETT on 04-27-2016-- negative ([facility] treadmill score 7)   History of Helicobacter pylori infection 2001 and 09/ 2012   HSV-2 infection    genital   Hx of adenomatous colonic polyps 10/17/2007   Hydradenitis    per pt currently treated with Humira injection   Hypertension    Hyperthyroidism    Hypocupremia    Hypothyroidism    Hypothyroidism, postsurgical 1980   IBS (irritable bowel syndrome)    Insomnia    Iron deficiency    Joint pain    Leukopenia     Lumbosacral radiculopathy    Lymphocytic colitis 05/14/2022   dx from colonoscopy   Medial meniscus tear    right knee   Mild obstructive sleep apnea    Mitral regurgitation    Mild to moderate by echo 09/2021   Numbness and tingling of right arm    Obesity    Osteoarthritis    Palpitations    Pernicious anemia    b12 def   Peroneal neuropathy    PONV (postoperative nausea and vomiting)    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   Prediabetes    Reactive hypoglycemia followed by pcp   post gastrectomy dumping syndrome   SOB (shortness of breath)    Spinal stenosis    Tendonitis    right shoulder   Varicose veins    Vertigo    Vitamin B 12 deficiency    Vitamin D deficiency    Past Surgical History:  Procedure Laterality Date   BREATH TEK H PYLORI N/A 05/03/2014   Procedure: BREATH TEK H PYLORI;  Surgeon: Wenda Low, MD;  Location: [facility];  Service: General;  Laterality: N/A;  CARDIOVASCULAR STRESS TEST  03/11/2013   dr Gloris Manchester turner   Low risk nuclear study w/ a very small anterior perfusion defect that most likely represents breast attenuation artifact, less likely this could represent a true perfusion abnormality in the distribution of diagonal branch of the LAD/  normal LV function and wall motion , ef 66%   CATARACT EXTRACTION W/ INTRAOCULAR LENS  IMPLANT, BILATERAL  10/2017   CESAREAN SECTION  1985   CHOLECYSTECTOMY OPEN  1987   COLONOSCOPY  02/2017   Dr Loreta Ave ; per patient they found 7 polyps with polypectomy; on 5 year track    ESOPHAGOGASTRODUODENOSCOPY  02/01/2021   FINE NEEDLE ASPIRATION Left 05/22/2017   Procedure: LEFT KNEE ASPIRATION;  Surgeon: Ollen Gross, MD;  Location: [facility];  Service: Orthopedics;  Laterality: Left;   GASTRIC ROUX-EN-Y N/A 07/06/2014   Procedure: LAPAROSCOPIC ROUX-EN-Y GASTRIC BYPASS, ENTEROLYSIS OF ADHESIONS WITH UPPER ENDOSCOPY;  Surgeon: Luretha Murphy, MD;  Location: [facility];  Service: General;  Laterality: N/A;   HAMMER TOE  SURGERY Bilateral 02/17/2008   bilateral foot digit's 2 and 5   HIATAL HERNIA REPAIR N/A 07/06/2014   Procedure: LAPAROSCOPIC REPAIR OF HIATAL HERNIA;  Surgeon: Luretha Murphy, MD;  Location: [facility];  Service: General;  Laterality: N/A;   I & D KNEE WITH POLY EXCHANGE Left 12/11/2019   Procedure: LEFT KNEE POLY-LINER EXCHANGE;  Surgeon: Kathryne Hitch, MD;  Location: [facility];  Service: Orthopedics;  Laterality: Left;   KNEE ARTHROSCOPY Right 12/11/2019   Procedure: RIGHT KNEE ARTHROSCOPY WITH PARTIAL MEDIAL MENISCECTOMY;  Surgeon: Kathryne Hitch, MD;  Location: [facility];  Service: Orthopedics;  Laterality: Right;   KNEE ARTHROSCOPY WITH MEDIAL MENISECTOMY Left 07/17/2017   Procedure: LEFT KNEE ARTHROSCOPY WITH MEDIAL LATERAL MENISECTOMY;  Surgeon: Ollen Gross, MD;  Location: [facility];  Service: Orthopedics;  Laterality: Left;   MASS EXCISION N/A 10/11/2016   Procedure: EXCISION OF PERIANAL MASS;  Surgeon: Romie Levee, MD;  [facility];  Service: General;  Laterality: N/A;   PARTIAL HYSTERECTOMY  2006   has ovaries   REFRACTIVE SURGERY     THYROIDECTOMY  1980   TOTAL ABDOMINAL HYSTERECTOMY  08-25-2002   dr Myrlene Broker   ovaries retained   TOTAL HIP ARTHROPLASTY Right 05/21/2012   Procedure: TOTAL HIP ARTHROPLASTY;  Surgeon: Loanne Drilling, MD;  Location: [facility];  Service: Orthopedics;  Laterality: Right;   TOTAL HIP ARTHROPLASTY Left 01-31-2009  dr Lequita Halt   TOTAL HIP REVISION Left 05/22/2017   Procedure: Left hip bearing surface and head revision;  Surgeon: Ollen Gross, MD;  Location: [facility];  Service: Orthopedics;  Laterality: Left;   TOTAL KNEE ARTHROPLASTY Left 01/20/2018   Procedure: LEFT TOTAL KNEE ARTHROPLASTY;  Surgeon: Ollen Gross, MD;  Location: [facility];  Service: Orthopedics;  Laterality: Left;    TOTAL KNEE ARTHROPLASTY Right 07/13/2022   Procedure: RIGHTTOTAL KNEE ARTHROPLASTY;  Surgeon: Kathryne Hitch, MD;  Location:  [facility];  Service: Orthopedics;  Laterality: Right;   TRANSTHORACIC ECHOCARDIOGRAM  05/03/2016   ef 55-60%/  trivial MR/  mild TR   TUBAL LIGATION     Patient Active Problem List   Diagnosis Date Noted   Status post total right knee replacement 07/13/2022   Weight gain 07/12/2022   Lymphocytic colitis 07/12/2022   Migraine without aura and without status migrainosus, not intractable 07/02/2022   Chronic conjunctivitis of both eyes 06/28/2022   Generalized abdominal pain 05/24/2022   Chronic rhinitis 05/09/2022  Chronic migraine without aura without status migrainosus, not intractable 12/26/2021   Depressed mood 12/19/2021   Obstructive sleep apnea 12/19/2021   Other hypoglycemia-post bariatric 12/05/2021   Secondary hyperparathyroidism (HCC) 12/05/2021   Inadequate fluid intake 12/05/2021   Inadequate sleep hygiene 12/05/2021   Class 3 severe obesity with body mass index (BMI) of 40.0 to 44.9 in adult [facility]) 12/05/2021   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   SOBOE (shortness of breath on exertion) 11/21/2021   Other fatigue 11/21/2021   Vitamin D deficiency 11/21/2021   Iron deficiency 11/21/2021   Hypoglycemia after bariatric surgery 11/21/2021   Depression screening 11/21/2021   Class 3 severe obesity with serious comorbidity and body mass index (BMI) of 40.0 to 44.9 in adult (HCC) 11/21/2021   Mild obstructive sleep apnea 11/17/2021   Vertigo 11/17/2021   Excessive daytime sleepiness 10/23/2021   Spinal stenosis of lumbar region 07/07/2020   Displacement of lumbar intervertebral disc 06/02/2020   Lumbar spondylosis 06/02/2020   Lumbar stenosis with neurogenic claudication 06/02/2020   Arm numbness 05/30/2020   Chronic neck pain 05/30/2020   Other chronic pain 05/30/2020   Elevated blood-pressure reading, without diagnosis of hypertension 05/30/2020   Status post arthroscopy of right knee 12/11/2019   Polyethylene wear of left knee joint prosthesis (HCC)  09/09/2019   Acute medial meniscal tear, right, subsequent encounter 08/27/2019   History of total left knee replacement 07/16/2019   Peroneal neuropathy at knee, left 06/25/2019   Lumbosacral radiculopathy 06/07/2019   Iron malabsorption 10/16/2018   IDA (iron deficiency anemia) 10/16/2018   OA (osteoarthritis) of knee 01/20/2018   Acute meniscal tear, medial 07/17/2017   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   Failed total hip arthroplasty, 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/2017   Pain in left knee 04/11/2017   Insomnia 11/08/2016   Pernicious anemia 11/08/2016   Leukopenia 07/09/2016   Hypocupremia 07/09/2016   Palpitations 04/12/2016   Shortness of breath 04/12/2016   Reactive hypoglycemia 10/31/2015   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/31/2015   Lap gastric bypass May 2016 07/06/2014   History of Clostridium difficile infection 08/12/2011   Post-surgical hypothyroidism 08/26/2010   Gastroesophageal reflux disease 08/26/2010   Vitamin D deficiency 08/26/2010   Fibrocystic disease of breast 08/26/2010   Cobalamin deficiency 08/26/2010   Morbid obesity (HCC) 03/20/2010   Backache 03/20/2010    PCP: Margaree Mackintosh, MD  REFERRING PROVIDER: Kathryne Hitch, MD  REFERRING DIAG: [phone] (ICD-10-CM) - Status post total right knee replacement  Rationale for Evaluation and Treatment: Rehabilitation  THERAPY DIAG:  Acute pain of right knee  Stiffness of right knee, not elsewhere classified  Muscle weakness (generalized)  Other abnormalities of gait and mobility  Localized edema  ONSET DATE: 07/13/22 (DOS)   SUBJECTIVE:  SUBJECTIVE STATEMENT: Took a pain pill this morning; pain is still high but better than last visit  PERTINENT HISTORY:  PMH includes: obesity, gastric bypass, L THA  revision, L TKA, OSA, spinal stenosis  PAIN:  Are you having pain? Yes: NPRS scale: "10 plus" currently, at best 5, up to 10/10 Pain location: Rt knee Pain description: throbbing, aching, occasional shocks Aggravating factors: end ranges; increased activity Relieving factors: repositioning, icing  PRECAUTIONS:  None  WEIGHT BEARING RESTRICTIONS:  No  FALLS:  Has patient fallen in last 6 months? No  LIVING ENVIRONMENT: Lives with: lives with their spouse Lives in: House/apartment Stairs: Yes: Internal: flight steps; has stair lift and External: 5 steps; can reach both (2 steps in garage; no railing) Has following equipment at home: Retail banker - 2 wheeled  OCCUPATION:  Charity fundraiser at [employer]  PLOF:  Independent and Leisure: walking in the park  PATIENT GOALS:  Regain use of RLE, improve motion in knee   OBJECTIVE: (objective measures completed at initial evaluation unless otherwise dated)   PATIENT SURVEYS:  07/31/22: FOTO 34 (predicted 51)  COGNITIVE STATUS: Within functional limits for tasks assessed   RED FLAGS: None    SENSATION: 07/31/22: Not tested  POSTURE:  rounded shoulders and forward head   GAIT: 07/31/22 Distance walked: 100' in clinic Assistive device utilized: Environmental consultant - 2 wheeled Level of assistance: Modified independence Comments: antalgic gait with decreased stance on Rt , decreased hip/knee flexion   Knee  LOWER EXTREMITY ROM:     ROM Right eval Left eval Right LE ROM 08/03/22 Right 08/06/22  Knee flexion A: 65 P:71 A: 100 Seated edge of mat, post manual  A: 79 deg P: 82 deg  A: 77 P: 86  Knee extension A: -2 (seated LAQ) P: 0 A: 4 (hyper) 0 deg A/PROM with mild pain at end range A: 0  (seated LAQ)   (Blank rows = not tested)   LOWER EXTREMITY MMT:    MMT Right eval Left eval  Knee flexion 3-/5   Knee extension 3-/5    (Blank rows = not tested)   TREATMENT:                                                                                                                               DATE:  08/10/22 TherEx SciFit bike seat 15; partial revolutions x 8 min Tailgate flexion x 3 min AA Rt knee flexion 10 x 10 sec hold; LLE providing the assist Sit to/from stand x 5 reps from elevated mat Seated LAQ 3# 3x10  Manual Seated Rt knee flexion PROM to tolerance x 8 min  Modalities Vaso x 10 min to Rt knee, mod pressure 34 deg  08/06/22 TherEx SciFit bike seat 15; partial revolutions x 8 min Tailgate flexion x 3 min Seated LAQ 2x10 with 1#; 5 sec hold ROM measurements - see above for details Supine AA heel slides  2x10   08/03/22 Therapeutic Exercise: Seated knee flexion AAROM strap and towel x12 w 5 sec hold, cues for appropriate ROM Seated LAQ 2x10 cues for full ROM and pacing  Standing RLE weight shifts with gentle knee bend, RW support 2x8 Verbal HEP review + education on strategies to maximize adherence/tolerance, pacing of activities outside of sessions to improve exercise adherence/tolerance  Manual Therapy: Seated edge of mat; knee flex/ext passive physiological movement  with gentle over pressure to pt tolerance, gentle distraction for increased ROM into flexion. Gentle oscillations to reduce muscle guarding    PATIENT EDUCATION:  Education details: rationale for interventions, HEP  Person educated: Patient Education method: Explanation, Demonstration, and Handouts Education comprehension: verbalized understanding, returned demonstration, and needs further education  HOME EXERCISE PROGRAM: Access Code: [email or web address] URL: [email or web address] Date: 07/31/2022 Prepared by: Moshe Cipro  Exercises - Seated Knee Flexion AAROM  - 5-10 x daily - 7 x weekly - 1 sets - 5-10 reps - 5 sec hold - Supine Heel Slide with Strap  - 5-10 x daily - 7 x weekly - 1 sets - 5-10 reps - Quad Set  - 5-10 x daily - 7 x weekly - 1 sets - 5-10 reps - 5 sec hold - Seated Long Arc Quad  - 2-3  x daily - 7 x weekly - 1-2 sets - 10 reps - Seated Straight Leg Raise  - 2-3 x daily - 7 x weekly - 1-2 sets - 10 reps   ASSESSMENT:  CLINICAL IMPRESSION: 08/10/2022  Pt tolerated session well today, and continues to have increased swelling in RLE affecting motion.  Will continue to benefit from PT to maximize function.  Anticipate working on transition to cane as pain allows. Will continue to benefit from PT to maximize function.   Per eval - Patient is a 62 y.o. female who was seen today for physical therapy evaluation and treatment for Rt TKA on 07/13/22. She demonstrates decreased strength ROM and balance, as well as gait abnormalities and expected post op pain and swelling affecting functional mobility.  She will benefit from PT to address deficits listed.    OBJECTIVE IMPAIRMENTS: Abnormal gait, decreased activity tolerance, decreased balance, decreased knowledge of use of DME, decreased mobility, difficulty walking, decreased ROM, decreased strength, hypomobility, increased fascial restrictions, increased muscle spasms, and pain.   ACTIVITY LIMITATIONS: carrying, lifting, bending, sitting, standing, squatting, sleeping, stairs, transfers, and locomotion level  PARTICIPATION LIMITATIONS: meal prep, cleaning, laundry, driving, shopping, community activity, occupation, and yard work  PERSONAL FACTORS: 3+ comorbidities: obesity, gastric bypass, L THA revision, L TKA, OSA, spinal stenosis  are also affecting patient's functional outcome.   REHAB POTENTIAL: Good  CLINICAL DECISION MAKING: Evolving/moderate complexity  EVALUATION COMPLEXITY: Moderate   GOALS: Goals reviewed with patient? Yes  SHORT TERM GOALS: Target date: 08/28/2022  Independent with initial HEP Goal status: INITIAL  2.  Rt knee AROM improved 0-90 deg for improved function Goal status: INITIAL   LONG TERM GOALS: Target date: 09/25/2022  Independent with final HEP Goal status: INITIAL  2.  FOTO score improved  to 51 Goal status: INITIAL  3.  Rt knee AROM improved to 0-100 for improved function and mobility Goal status: INIITAL  4.  Report pain < 3/10 with amb and standing activities for improved function Goal status: INITIAL  5.  Amb with LRAD modified independently for improved function and mobility Goal status: INITIAL    PLAN:  PT FREQUENCY: 2x/week  PT DURATION: 8 weeks  PLANNED INTERVENTIONS:  Therapeutic exercises, Therapeutic activity, Neuromuscular re-education, Balance training, Gait training, Patient/Family education, Self Care, Joint mobilization, Stair training, DME instructions, Aquatic Therapy, Dry Needling, Electrical stimulation, Cryotherapy, Moist heat, Taping, Vasopneumatic device, Manual therapy, and Re-evaluation.  PLAN FOR NEXT SESSION: trial of cane,  monitor pain levels, aggressive focus on flexion as pt can tolerate, continue quad activation.     NEXT MD VISIT: 09/05/22  Clarita Crane, PT, DPT 08/10/22 10:03 AM

## 2022-08-13 ENCOUNTER — Encounter (INDEPENDENT_AMBULATORY_CARE_PROVIDER_SITE_OTHER): Payer: Self-pay

## 2022-08-13 ENCOUNTER — Ambulatory Visit (INDEPENDENT_AMBULATORY_CARE_PROVIDER_SITE_OTHER): Payer: 59 | Admitting: Internal Medicine

## 2022-08-13 ENCOUNTER — Encounter (INDEPENDENT_AMBULATORY_CARE_PROVIDER_SITE_OTHER): Payer: Self-pay | Admitting: Internal Medicine

## 2022-08-13 VITALS — BP 138/82 | HR 77 | Temp 98.0°F | Ht 67.0 in | Wt 276.0 lb

## 2022-08-13 DIAGNOSIS — Z6841 Body Mass Index (BMI) 40.0 and over, adult: Secondary | ICD-10-CM | POA: Diagnosis not present

## 2022-08-13 DIAGNOSIS — E669 Obesity, unspecified: Secondary | ICD-10-CM

## 2022-08-13 DIAGNOSIS — Z9884 Bariatric surgery status: Secondary | ICD-10-CM

## 2022-08-13 DIAGNOSIS — Z79899 Other long term (current) drug therapy: Secondary | ICD-10-CM | POA: Diagnosis not present

## 2022-08-13 DIAGNOSIS — K52832 Lymphocytic colitis: Secondary | ICD-10-CM

## 2022-08-13 NOTE — Assessment & Plan Note (Signed)
Reviewed records as well as pathology she has evidence of lymphocytic colitis pattern on colonic mucosa.  She is followed by gastroenterology but has not been using Entocort. I provided her with a list of medications that may trigger condition.  She denies using PPI or taking her Lexapro.

## 2022-08-13 NOTE — Addendum Note (Signed)
Addended by: Paulla Fore A on: 08/13/2022 12:59 PM   Modules accepted: Orders

## 2022-08-13 NOTE — Assessment & Plan Note (Addendum)
Weight regain likely secondary to maladaptive behaviors following surgery.  Await upper GI series ordered last office visit to see if there is an increase in outlet size as she may be a candidate for TORE if that is the case.  Medical assistant will look into order.

## 2022-08-13 NOTE — Progress Notes (Signed)
Office: [phone]  /  Fax: [phone]  WEIGHT SUMMARY AND BIOMETRICS  Vitals Temp: 98 F (36.7 C) BP: 138/82 Pulse Rate: 77 SpO2: 99 %   Anthropometric Measurements Height: 5\' 7"  (1.702 m) Weight: 276 lb (125.2 kg) BMI (Calculated): 43.22 Weight at Last Visit: 280 lb Weight Lost Since Last Visit: 4 lb Starting Weight: 273 lb Total Weight Loss (lbs): 4 lb (1.814 kg) Peak Weight: 297 lb   Body Composition  Body Fat %: 55.8 % Fat Mass (lbs): 154.4 lbs Muscle Mass (lbs): 116 lbs Visceral Fat Rating : 19    No data recorded Today's Visit #: 12  Starting Date: 11/20/21   HPI  Chief Complaint: OBESITY  Victoria [name] is here to discuss her progress with her obesity treatment plan. She is not following a food plan and states she is following her eating plan approximately 0 % of the time. She states she is  not exercising.  Interval History:  Since last office visit she has lost 4 pounds. S/p right knee replacement.  She reports suboptimal adherence due to loss of appetite due to pain medication. Her BIA data suggest that she is losing muscle mass while increasing fat mass.  Orixegenic Control: Denies problems with appetite and hunger signals.  Denies problems with satiety and satiation.  Denies problems with eating patterns and portion control.  Denies abnormal cravings. Denies feeling deprived or restricted.   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   Pharmacotherapy for weight loss: She is currently taking no anti-obesity medication.    ASSESSMENT AND PLAN  TREATMENT PLAN FOR OBESITY:  Recommended Dietary Goals  Victoria [name] is currently in the action stage of change. As such, her goal is to continue weight management plan. She has agreed to: portion control, balanced plate and making smarter food choices, such as increasing vegetables,  protein intake and reducing simple carbohydrates and processed foods   Behavioral Intervention  We discussed the following Behavioral Modification Strategies today: increasing lean protein intake, decreasing simple carbohydrates , increasing vegetables, increasing lower glycemic fruits, increasing water intake, and planning for success.  Additional resources provided today:  Information on lymphocytic colitis  Recommended Physical Activity Goals  Victoria [name] has been advised to work up to 150 minutes of moderate intensity aerobic activity a week and strengthening exercises 2-3 times per week for cardiovascular health, weight loss maintenance and preservation of muscle mass.   She has agreed to :  Unable to participate in physical activity at present due to medical conditions   Pharmacotherapy We discussed various medication options to help Victoria [name] with her weight loss efforts and we both agreed to : continue with nutritional and behavioral strategies  ASSOCIATED CONDITIONS ADDRESSED TODAY  History of Roux-en-Y gastric bypass Assessment & Plan: Weight regain likely secondary to maladaptive behaviors following surgery.  Await upper GI series ordered last office visit to see if there is an increase in outlet size as she may be a candidate for Victoria [name] if that is the case.  Medical assistant will look into order.   Lymphocytic colitis Assessment & Plan: Reviewed records as well as pathology she has evidence of lymphocytic colitis pattern on colonic mucosa.  She is followed by gastroenterology but has not been using Entocort. I provided her with a list of medications that may trigger condition.  She denies using PPI or taking her Lexapro.   Obesity with current BMI of 43  Polypharmacy Assessment &  Plan: Patient has stopped several of her medications but she is also on at least 4 CNS depressing drugs including tizanidine, gabapentin, alprazolam, oxycodone.  Combine these medications may affect  respiratory rate, especially in a patient with untreated sleep apnea.  I recommend that she schedule an appointment with her PCP for medication reconciliation postoperatively.  I advised patient to avoid using alprazolam with oxycodone and also to hold gabapentin.     PHYSICAL EXAM:  Blood pressure 138/82, pulse 77, temperature 98 F (36.7 C), height 5\' 7"  (1.702 m), weight 276 lb (125.2 kg), SpO2 99 %. Body mass index is 43.23 kg/m.  General: She is overweight, cooperative, alert, well developed, and in no acute distress. PSYCH: Has normal mood, affect and thought process.   HEENT: EOMI, sclerae are anicteric. Lungs: Normal breathing effort, no conversational dyspnea. Extremities: No edema.  Neurologic: No gross sensory or motor deficits. No tremors or fasciculations noted.    DIAGNOSTIC DATA REVIEWED:  BMET    Component Value Date/Time   NA 137 07/18/2022 1300   NA 141 05/24/2022 1122   K 4.1 07/18/2022 1300   CL 105 07/18/2022 1300   CO2 20 (L) 07/18/2022 1300   GLUCOSE 85 07/18/2022 1300   BUN 14 07/18/2022 1300   BUN 20 05/24/2022 1122   CREATININE 1.11 (H) 07/18/2022 1300   CREATININE 1.23 (H) 07/09/2022 1132   CALCIUM 8.5 (L) 07/18/2022 1300   GFRNONAA 57 (L) 07/18/2022 1300   GFRNONAA 93 06/30/2020 1105   GFRAA 108 06/30/2020 1105   Lab Results  Component Value Date   HGBA1C 5.7 (H) 07/05/2022   HGBA1C 5.7 (H) 08/20/2013   Lab Results  Component Value Date   INSULIN 7.1 05/24/2022   INSULIN 4 09/16/2015   Lab Results  Component Value Date   TSH 3.17 07/05/2022   CBC    Component Value Date/Time   WBC 6.4 07/18/2022 1300   RBC 2.96 (L) 07/18/2022 1300   HGB 9.9 (L) 07/18/2022 1300   HGB 12.0 05/24/2022 1122   HGB 12.6 07/09/2016 1529   HCT 29.6 (L) 07/18/2022 1300   HCT 37.4 05/24/2022 1122   HCT 38.2 07/09/2016 1529   PLT 200 07/18/2022 1300   PLT 192 05/24/2022 1122   MCV 100.0 07/18/2022 1300   MCV 102 (H) 05/24/2022 1122   MCV 96.9  07/09/2016 1529   MCH 33.4 07/18/2022 1300   MCHC 33.4 07/18/2022 1300   RDW 13.9 07/18/2022 1300   RDW 12.2 05/24/2022 1122   RDW 13.5 07/09/2016 1529   Iron Studies    Component Value Date/Time   IRON 123 01/12/2022 1245   IRON 49 09/18/2018 0853   TIBC 343 01/12/2022 1245   TIBC 415 09/18/2018 0853   FERRITIN 21 01/12/2022 1246   FERRITIN 11 (L) 09/18/2018 0853   IRONPCTSAT 36 (H) 01/12/2022 1245   IRONPCTSAT 40 06/29/2021 1105   Lipid Panel     Component Value Date/Time   CHOL 197 07/05/2022 0921   CHOL 191 02/20/2022 0809   TRIG 41 07/05/2022 0921   HDL 86 07/05/2022 0921   HDL 84 02/20/2022 0809   CHOLHDL 2.3 07/05/2022 0921   VLDL 9 03/12/2016 1038   LDLCALC 99 07/05/2022 0921   Hepatic Function Panel     Component Value Date/Time   PROT 7.2 07/18/2022 1300   PROT 7.1 05/24/2022 1122   ALBUMIN 3.7 07/18/2022 1300   ALBUMIN 4.1 05/24/2022 1122   AST 35 07/18/2022 1300   AST 26  10/16/2018 0903   ALT 43 07/18/2022 1300   ALT 26 10/16/2018 0903   ALKPHOS 102 07/18/2022 1300   BILITOT 1.0 07/18/2022 1300   BILITOT <0.2 05/24/2022 1122   BILITOT 0.5 10/16/2018 0903   BILIDIR 0.1 03/16/2013 0740      Component Value Date/Time   TSH 3.17 07/05/2022 0921   Nutritional Lab Results  Component Value Date   VD25OH 50.1 02/20/2022   VD25OH 29.4 (L) 11/20/2021   VD25OH 18.9 (L) 07/07/2020     Return in about 4 weeks (around 09/10/2022) for For Weight Mangement with Dr. Rikki Spearing.Marland Kitchen She was informed of the importance of frequent follow up visits to maximize her success with intensive lifestyle modifications for her multiple health conditions.   ATTESTASTION STATEMENTS:  Reviewed by clinician on day of visit: allergies, medications, problem list, medical history, surgical history, family history, social history, and previous encounter notes.     Worthy Rancher, MD

## 2022-08-13 NOTE — Assessment & Plan Note (Signed)
Patient has stopped several of her medications but she is also on at least 4 CNS depressing drugs including tizanidine, gabapentin, alprazolam, oxycodone.  Combine these medications may affect respiratory rate, especially in a patient with untreated sleep apnea.  I recommend that she schedule an appointment with her PCP for medication reconciliation postoperatively.  I advised patient to avoid using alprazolam with oxycodone and also to hold gabapentin.

## 2022-08-14 ENCOUNTER — Other Ambulatory Visit: Payer: Self-pay | Admitting: Obstetrics and Gynecology

## 2022-08-14 ENCOUNTER — Other Ambulatory Visit (HOSPITAL_COMMUNITY): Payer: Self-pay

## 2022-08-14 ENCOUNTER — Encounter: Payer: Self-pay | Admitting: Family

## 2022-08-14 ENCOUNTER — Encounter: Payer: Self-pay | Admitting: Obstetrics and Gynecology

## 2022-08-14 ENCOUNTER — Encounter: Payer: Self-pay | Admitting: Physical Therapy

## 2022-08-14 ENCOUNTER — Ambulatory Visit: Payer: 59 | Admitting: Physical Therapy

## 2022-08-14 DIAGNOSIS — M25661 Stiffness of right knee, not elsewhere classified: Secondary | ICD-10-CM | POA: Diagnosis not present

## 2022-08-14 DIAGNOSIS — R2689 Other abnormalities of gait and mobility: Secondary | ICD-10-CM

## 2022-08-14 DIAGNOSIS — M6281 Muscle weakness (generalized): Secondary | ICD-10-CM | POA: Diagnosis not present

## 2022-08-14 DIAGNOSIS — R600 Localized edema: Secondary | ICD-10-CM | POA: Diagnosis not present

## 2022-08-14 DIAGNOSIS — M25561 Pain in right knee: Secondary | ICD-10-CM | POA: Diagnosis not present

## 2022-08-14 DIAGNOSIS — Z79890 Hormone replacement therapy (postmenopausal): Secondary | ICD-10-CM

## 2022-08-14 DIAGNOSIS — N951 Menopausal and female climacteric states: Secondary | ICD-10-CM

## 2022-08-14 MED ORDER — ESTRADIOL 1 MG/GM TD GEL
1.0000 | Freq: Every day | TRANSDERMAL | 1 refills | Status: DC
  Filled 2022-10-01: qty 90, 90d supply, fill #0
  Filled 2022-10-02: qty 30, 30d supply, fill #0

## 2022-08-14 NOTE — Therapy (Signed)
OUTPATIENT PHYSICAL THERAPY TREATMENT NOTE    Patient Name: Victoria [name] MRN: [id number] DOB:03/20/1960, 62 y.o., female Today's Date: 08/14/2022  END OF SESSION:  PT End of Session - 08/14/22 0930     Visit Number 5    Number of Visits 16    Date for PT Re-Evaluation 09/25/22    Authorization Type [facility] [employer] $40 copay    PT Start Time 0928    PT Stop Time 1017    PT Time Calculation (min) 49 min    Activity Tolerance Patient tolerated treatment well;No increased pain    Behavior During Therapy [facility] for tasks assessed/performed                 Past Medical History:  Diagnosis Date   Anxiety    Back pain    Bursitis    right shoulder   Chronic kidney disease    hematuria, has been worked up, her normal   Chronic leukopenia    intermittant since 2010, followed by Dr. Lenord Fellers now   Chronic neck pain    Constipation    Degenerative joint disease of low back 09/02/2015   Dr. Despina Hick   Displacement of lumbar intervertebral disc    Dysrhythmia    Tachycardia, PVCs, PACs   Edema of both lower legs    Fibrocystic breast    Gallbladder problem    GERD (gastroesophageal reflux disease)    Headache    per pt more stress/tension   Heart palpitations    SEE EPIC ENCOUTNER , CARDIOLOGY DR. Gloris Manchester TURNER 2018; reports on 05-16-17 " i haven't had any bouts of those lately"     Hemorrhoids    Hidradenitis suppurativa    History of Clostridium difficile infection 12/2010   History of exercise intolerance    ETT on 04-27-2016-- negative ([facility] treadmill score 7)   History of Helicobacter pylori infection 2001 and 09/ 2012   HSV-2 infection    genital   Hx of adenomatous colonic polyps 10/17/2007   Hydradenitis    per pt currently treated with Humira injection   Hypertension    Hyperthyroidism    Hypocupremia    Hypothyroidism    Hypothyroidism, postsurgical 1980   IBS (irritable bowel syndrome)    Insomnia    Iron deficiency    Joint pain    Leukopenia     Lumbosacral radiculopathy    Lymphocytic colitis 05/14/2022   dx from colonoscopy   Medial meniscus tear    right knee   Mild obstructive sleep apnea    Mitral regurgitation    Mild to moderate by echo 09/2021   Numbness and tingling of right arm    Obesity    Osteoarthritis    Palpitations    Pernicious anemia    b12 def   Peroneal neuropathy    PONV (postoperative nausea and vomiting)    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   Prediabetes    Reactive hypoglycemia followed by pcp   post gastrectomy dumping syndrome   SOB (shortness of breath)    Spinal stenosis    Tendonitis    right shoulder   Varicose veins    Vertigo    Vitamin B 12 deficiency    Vitamin D deficiency    Past Surgical History:  Procedure Laterality Date   BREATH TEK H PYLORI N/A 05/03/2014   Procedure: BREATH TEK H PYLORI;  Surgeon: Wenda Low, MD;  Location: [facility];  Service: General;  Laterality: N/A;  CARDIOVASCULAR STRESS TEST  03/11/2013   dr Gloris Manchester turner   Low risk nuclear study w/ a very small anterior perfusion defect that most likely represents breast attenuation artifact, less likely this could represent a true perfusion abnormality in the distribution of diagonal branch of the LAD/  normal LV function and wall motion , ef 66%   CATARACT EXTRACTION W/ INTRAOCULAR LENS  IMPLANT, BILATERAL  10/2017   CESAREAN SECTION  1985   CHOLECYSTECTOMY OPEN  1987   COLONOSCOPY  02/2017   Dr Loreta Ave ; per patient they found 7 polyps with polypectomy; on 5 year track    ESOPHAGOGASTRODUODENOSCOPY  02/01/2021   FINE NEEDLE ASPIRATION Left 05/22/2017   Procedure: LEFT KNEE ASPIRATION;  Surgeon: Ollen Gross, MD;  Location: [facility];  Service: Orthopedics;  Laterality: Left;   GASTRIC ROUX-EN-Y N/A 07/06/2014   Procedure: LAPAROSCOPIC ROUX-EN-Y GASTRIC BYPASS, ENTEROLYSIS OF ADHESIONS WITH UPPER ENDOSCOPY;  Surgeon: Luretha Murphy, MD;  Location: [facility];  Service: General;  Laterality: N/A;   HAMMER  TOE SURGERY Bilateral 02/17/2008   bilateral foot digit's 2 and 5   HIATAL HERNIA REPAIR N/A 07/06/2014   Procedure: LAPAROSCOPIC REPAIR OF HIATAL HERNIA;  Surgeon: Luretha Murphy, MD;  Location: [facility];  Service: General;  Laterality: N/A;   I & D KNEE WITH POLY EXCHANGE Left 12/11/2019   Procedure: LEFT KNEE POLY-LINER EXCHANGE;  Surgeon: Kathryne Hitch, MD;  Location: [facility];  Service: Orthopedics;  Laterality: Left;   KNEE ARTHROSCOPY Right 12/11/2019   Procedure: RIGHT KNEE ARTHROSCOPY WITH PARTIAL MEDIAL MENISCECTOMY;  Surgeon: Kathryne Hitch, MD;  Location: [facility];  Service: Orthopedics;  Laterality: Right;   KNEE ARTHROSCOPY WITH MEDIAL MENISECTOMY Left 07/17/2017   Procedure: LEFT KNEE ARTHROSCOPY WITH MEDIAL LATERAL MENISECTOMY;  Surgeon: Ollen Gross, MD;  Location: [facility];  Service: Orthopedics;  Laterality: Left;   MASS EXCISION N/A 10/11/2016   Procedure: EXCISION OF PERIANAL MASS;  Surgeon: Romie Levee, MD;  [facility];  Service: General;  Laterality: N/A;   PARTIAL HYSTERECTOMY  2006   has ovaries   REFRACTIVE SURGERY     THYROIDECTOMY  1980   TOTAL ABDOMINAL HYSTERECTOMY  08-25-2002   dr Myrlene Broker   ovaries retained   TOTAL HIP ARTHROPLASTY Right 05/21/2012   Procedure: TOTAL HIP ARTHROPLASTY;  Surgeon: Loanne Drilling, MD;  Location: [facility];  Service: Orthopedics;  Laterality: Right;   TOTAL HIP ARTHROPLASTY Left 01-31-2009  dr Lequita Halt   TOTAL HIP REVISION Left 05/22/2017   Procedure: Left hip bearing surface and head revision;  Surgeon: Ollen Gross, MD;  Location: [facility];  Service: Orthopedics;  Laterality: Left;   TOTAL KNEE ARTHROPLASTY Left 01/20/2018   Procedure: LEFT TOTAL KNEE ARTHROPLASTY;  Surgeon: Ollen Gross, MD;  Location: [facility];  Service: Orthopedics;  Laterality: Left;    TOTAL KNEE ARTHROPLASTY Right 07/13/2022   Procedure: RIGHTTOTAL KNEE ARTHROPLASTY;  Surgeon: Kathryne Hitch, MD;   Location: [facility];  Service: Orthopedics;  Laterality: Right;   TRANSTHORACIC ECHOCARDIOGRAM  05/03/2016   ef 55-60%/  trivial MR/  mild TR   TUBAL LIGATION     Patient Active Problem List   Diagnosis Date Noted   Polypharmacy 08/13/2022   Status post total right knee replacement 07/13/2022   Weight gain 07/12/2022   Lymphocytic colitis 07/12/2022   Migraine without aura and without status migrainosus, not intractable 07/02/2022   Chronic conjunctivitis of both eyes 06/28/2022   Generalized abdominal pain 05/24/2022  Chronic rhinitis 05/09/2022   Chronic migraine without aura without status migrainosus, not intractable 12/26/2021   Depressed mood 12/19/2021   Obstructive sleep apnea 12/19/2021   Other hypoglycemia-post bariatric 12/05/2021   Secondary hyperparathyroidism (HCC) 12/05/2021   Inadequate fluid intake 12/05/2021   Inadequate sleep hygiene 12/05/2021   Class 3 severe obesity with body mass index (BMI) of 40.0 to 44.9 in adult [facility]) 12/05/2021   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   SOBOE (shortness of breath on exertion) 11/21/2021   Other fatigue 11/21/2021   Vitamin D deficiency 11/21/2021   Iron deficiency 11/21/2021   Hypoglycemia after bariatric surgery 11/21/2021   Depression screening 11/21/2021   Class 3 severe obesity with serious comorbidity and body mass index (BMI) of 40.0 to 44.9 in adult (HCC) 11/21/2021   Mild obstructive sleep apnea 11/17/2021   Vertigo 11/17/2021   Excessive daytime sleepiness 10/23/2021   Spinal stenosis of lumbar region 07/07/2020   Displacement of lumbar intervertebral disc 06/02/2020   Lumbar spondylosis 06/02/2020   Lumbar stenosis with neurogenic claudication 06/02/2020   Arm numbness 05/30/2020   Chronic neck pain 05/30/2020   Other chronic pain 05/30/2020   Elevated blood-pressure reading, without diagnosis of hypertension 05/30/2020   Status post arthroscopy of right knee 12/11/2019   Polyethylene wear of left  knee joint prosthesis (HCC) 09/09/2019   Acute medial meniscal tear, right, subsequent encounter 08/27/2019   History of total left knee replacement 07/16/2019   Peroneal neuropathy at knee, left 06/25/2019   Lumbosacral radiculopathy 06/07/2019   Iron malabsorption 10/16/2018   IDA (iron deficiency anemia) 10/16/2018   OA (osteoarthritis) of knee 01/20/2018   Acute meniscal tear, medial 07/17/2017   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   Failed total hip arthroplasty, 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/2017   Pain in left knee 04/11/2017   Insomnia 11/08/2016   Pernicious anemia 11/08/2016   Leukopenia 07/09/2016   Hypocupremia 07/09/2016   Palpitations 04/12/2016   Shortness of breath 04/12/2016   Reactive hypoglycemia 10/31/2015   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/31/2015   Lap gastric bypass May 2016 07/06/2014   History of Clostridium difficile infection 08/12/2011   Post-surgical hypothyroidism 08/26/2010   Gastroesophageal reflux disease 08/26/2010   Vitamin D deficiency 08/26/2010   Fibrocystic disease of breast 08/26/2010   Cobalamin deficiency 08/26/2010   Morbid obesity (HCC) 03/20/2010   Backache 03/20/2010    PCP: Margaree Mackintosh, MD  REFERRING PROVIDER: Kathryne Hitch, MD  REFERRING DIAG: [phone] (ICD-10-CM) - Status post total right knee replacement  Rationale for Evaluation and Treatment: Rehabilitation  THERAPY DIAG:  Acute pain of right knee  Stiffness of right knee, not elsewhere classified  Muscle weakness (generalized)  Other abnormalities of gait and mobility  Localized edema  ONSET DATE: 07/13/22 (DOS)   SUBJECTIVE:  SUBJECTIVE STATEMENT: Knee continues to be painful; swelling is still present.  Pain is a little better  PERTINENT HISTORY:  PMH includes:  obesity, gastric bypass, L THA revision, L TKA, OSA, spinal stenosis  PAIN:  Are you having pain? Yes: NPRS scale: 6 currently, at best 5, up to 10/10 Pain location: Rt knee Pain description: throbbing, aching, occasional shocks Aggravating factors: end ranges; increased activity Relieving factors: repositioning, icing  PRECAUTIONS:  None  WEIGHT BEARING RESTRICTIONS:  No  FALLS:  Has patient fallen in last 6 months? No  LIVING ENVIRONMENT: Lives with: lives with their spouse Lives in: House/apartment Stairs: Yes: Internal: flight steps; has stair lift and External: 5 steps; can reach both (2 steps in garage; no railing) Has following equipment at home: Retail banker - 2 wheeled  OCCUPATION:  Charity fundraiser at [employer]  PLOF:  Independent and Leisure: walking in the park  PATIENT GOALS:  Regain use of RLE, improve motion in knee   OBJECTIVE: (objective measures completed at initial evaluation unless otherwise dated)   PATIENT SURVEYS:  07/31/22: FOTO 34 (predicted 51)  COGNITIVE STATUS: Within functional limits for tasks assessed   RED FLAGS: None    SENSATION: 07/31/22: Not tested  POSTURE:  rounded shoulders and forward head   GAIT: 07/31/22 Distance walked: 100' in clinic Assistive device utilized: Environmental consultant - 2 wheeled Level of assistance: Modified independence Comments: antalgic gait with decreased stance on Rt , decreased hip/knee flexion   Knee  LOWER EXTREMITY ROM:     ROM Right eval Left eval Right LE ROM 08/03/22 Right 08/06/22 Right 08/14/22  Knee flexion A: 65 P:71 A: 100 Seated edge of mat, post manual  A: 79 deg P: 82 deg  A: 77 P: 86 A:  P:   Knee extension A: -2 (seated LAQ) P: 0 A: 4 (hyper) 0 deg A/PROM with mild pain at end range A: 0  (seated LAQ)    (Blank rows = not tested)   LOWER EXTREMITY MMT:    MMT Right eval Left eval  Knee flexion 3-/5   Knee extension 3-/5    (Blank rows = not tested)   TREATMENT:                                                                                                                               DATE:  08/14/22 TherEx SciFit bike seat 15; partial revolutions x 8 min Seated LAQ 3# 3x10; 3 sec hold at end ranges on Rt Sit to/from stand x 10 reps; from elevated  Heel slides x 10 reps with strap  Gait Training Amb with SPC with supervision and min cues for sequencing - safe with household and limited community ambulation  Manual Seated Rt knee flexion PROM to tolerance x 8 min  Modalities Vaso x 10 min to Rt knee, mod pressure 34 deg  08/10/22 TherEx SciFit bike seat 15; partial revolutions x 8 min  Tailgate flexion x 3 min AA Rt knee flexion 10 x 10 sec hold; LLE providing the assist Sit to/from stand x 5 reps from elevated mat Seated LAQ 3# 3x10  Manual Seated Rt knee flexion PROM to tolerance x 8 min  Modalities Vaso x 10 min to Rt knee, mod pressure 34 deg  08/06/22 TherEx SciFit bike seat 15; partial revolutions x 8 min Tailgate flexion x 3 min Seated LAQ 2x10 with 1#; 5 sec hold ROM measurements - see above for details Supine AA heel slides 2x10   08/03/22 Therapeutic Exercise: Seated knee flexion AAROM strap and towel x12 w 5 sec hold, cues for appropriate ROM Seated LAQ 2x10 cues for full ROM and pacing  Standing RLE weight shifts with gentle knee bend, RW support 2x8 Verbal HEP review + education on strategies to maximize adherence/tolerance, pacing of activities outside of sessions to improve exercise adherence/tolerance  Manual Therapy: Seated edge of mat; knee flex/ext passive physiological movement  with gentle over pressure to pt tolerance, gentle distraction for increased ROM into flexion. Gentle oscillations to reduce muscle guarding    PATIENT EDUCATION:  Education details: rationale for interventions, HEP  Person educated: Patient Education method: Explanation, Demonstration, and Handouts Education comprehension: verbalized  understanding, returned demonstration, and needs further education  HOME EXERCISE PROGRAM: Access Code: [email or web address] URL: [email or web address] Date: 07/31/2022 Prepared by: Moshe Cipro  Exercises - Seated Knee Flexion AAROM  - 5-10 x daily - 7 x weekly - 1 sets - 5-10 reps - 5 sec hold - Supine Heel Slide with Strap  - 5-10 x daily - 7 x weekly - 1 sets - 5-10 reps - Quad Set  - 5-10 x daily - 7 x weekly - 1 sets - 5-10 reps - 5 sec hold - Seated Long Arc Quad  - 2-3 x daily - 7 x weekly - 1-2 sets - 10 reps - Seated Straight Leg Raise  - 2-3 x daily - 7 x weekly - 1-2 sets - 10 reps   ASSESSMENT:  CLINICAL IMPRESSION: 08/14/2022  Improvement in AROM noted today; still limited by swelling but improved.  Safe for household and limited community ambulation with SPC at this time as well.  Will continue to benefit from PT to maximize function.  Per eval - Patient is a 62 y.o. female who was seen today for physical therapy evaluation and treatment for Rt TKA on 07/13/22. She demonstrates decreased strength ROM and balance, as well as gait abnormalities and expected post op pain and swelling affecting functional mobility.  She will benefit from PT to address deficits listed.    OBJECTIVE IMPAIRMENTS: Abnormal gait, decreased activity tolerance, decreased balance, decreased knowledge of use of DME, decreased mobility, difficulty walking, decreased ROM, decreased strength, hypomobility, increased fascial restrictions, increased muscle spasms, and pain.   ACTIVITY LIMITATIONS: carrying, lifting, bending, sitting, standing, squatting, sleeping, stairs, transfers, and locomotion level  PARTICIPATION LIMITATIONS: meal prep, cleaning, laundry, driving, shopping, community activity, occupation, and yard work  PERSONAL FACTORS: 3+ comorbidities: obesity, gastric bypass, L THA revision, L TKA, OSA, spinal stenosis  are also affecting patient's functional outcome.   REHAB POTENTIAL:  Good  CLINICAL DECISION MAKING: Evolving/moderate complexity  EVALUATION COMPLEXITY: Moderate   GOALS: Goals reviewed with patient? Yes  SHORT TERM GOALS: Target date: 08/28/2022  Independent with initial HEP Goal status: INITIAL  2.  Rt knee AROM improved 0-90 deg for improved function Goal status: INITIAL   LONG TERM GOALS: Target date: 09/25/2022  Independent with  final HEP Goal status: INITIAL  2.  FOTO score improved to 51 Goal status: INITIAL  3.  Rt knee AROM improved to 0-100 for improved function and mobility Goal status: INIITAL  4.  Report pain < 3/10 with amb and standing activities for improved function Goal status: INITIAL  5.  Amb with LRAD modified independently for improved function and mobility Goal status: INITIAL    PLAN:  PT FREQUENCY: 2x/week  PT DURATION: 8 weeks  PLANNED INTERVENTIONS: Therapeutic exercises, Therapeutic activity, Neuromuscular re-education, Balance training, Gait training, Patient/Family education, Self Care, Joint mobilization, Stair training, DME instructions, Aquatic Therapy, Dry Needling, Electrical stimulation, Cryotherapy, Moist heat, Taping, Vasopneumatic device, Manual therapy, and Re-evaluation.  PLAN FOR NEXT SESSION: check STGs, amb with cane,  monitor pain levels, aggressive focus on flexion as pt can tolerate, continue quad activation.     NEXT MD VISIT: 09/05/22  Clarita Crane, PT, DPT 08/14/22 10:12 AM

## 2022-08-14 NOTE — Telephone Encounter (Signed)
Last AEX 07/12/2021--was scheduled for 07/18/2022, appt was cancelled, has not yet r/s.  Last mammo 05/29/2022-neg birads 1  CC: Carolynn Serve, CMA, for PA info if needed.

## 2022-08-15 ENCOUNTER — Other Ambulatory Visit (HOSPITAL_COMMUNITY): Payer: Self-pay

## 2022-08-16 ENCOUNTER — Encounter: Payer: Self-pay | Admitting: Physical Therapy

## 2022-08-16 ENCOUNTER — Other Ambulatory Visit (HOSPITAL_COMMUNITY): Payer: Self-pay

## 2022-08-16 ENCOUNTER — Ambulatory Visit (INDEPENDENT_AMBULATORY_CARE_PROVIDER_SITE_OTHER): Payer: 59 | Admitting: Physical Therapy

## 2022-08-16 DIAGNOSIS — M25561 Pain in right knee: Secondary | ICD-10-CM

## 2022-08-16 DIAGNOSIS — M6281 Muscle weakness (generalized): Secondary | ICD-10-CM | POA: Diagnosis not present

## 2022-08-16 DIAGNOSIS — R600 Localized edema: Secondary | ICD-10-CM | POA: Diagnosis not present

## 2022-08-16 DIAGNOSIS — M25661 Stiffness of right knee, not elsewhere classified: Secondary | ICD-10-CM

## 2022-08-16 DIAGNOSIS — R2689 Other abnormalities of gait and mobility: Secondary | ICD-10-CM

## 2022-08-16 NOTE — Therapy (Signed)
OUTPATIENT PHYSICAL THERAPY TREATMENT NOTE    Patient Name: Victoria [name] MRN: [id number] DOB:07/18/1960, 62 y.o., female Today's Date: 08/16/2022  END OF SESSION:  PT End of Session - 08/16/22 1145     Visit Number 6    Number of Visits 16    Date for PT Re-Evaluation 09/25/22    Authorization Type [facility] [employer] $40 copay    PT Start Time 1100    PT Stop Time 1145    PT Time Calculation (min) 45 min    Activity Tolerance Patient tolerated treatment well;No increased pain    Behavior During Therapy [facility] for tasks assessed/performed                 Past Medical History:  Diagnosis Date   Anxiety    Back pain    Bursitis    right shoulder   Chronic kidney disease    hematuria, has been worked up, her normal   Chronic leukopenia    intermittant since 2010, followed by Dr. Lenord Fellers now   Chronic neck pain    Constipation    Degenerative joint disease of low back 09/02/2015   Dr. Despina Hick   Displacement of lumbar intervertebral disc    Dysrhythmia    Tachycardia, PVCs, PACs   Edema of both lower legs    Fibrocystic breast    Gallbladder problem    GERD (gastroesophageal reflux disease)    Headache    per pt more stress/tension   Heart palpitations    SEE EPIC ENCOUTNER , CARDIOLOGY DR. Gloris Manchester TURNER 2018; reports on 05-16-17 " i haven't had any bouts of those lately"     Hemorrhoids    Hidradenitis suppurativa    History of Clostridium difficile infection 12/2010   History of exercise intolerance    ETT on 04-27-2016-- negative ([facility] treadmill score 7)   History of Helicobacter pylori infection 2001 and 09/ 2012   HSV-2 infection    genital   Hx of adenomatous colonic polyps 10/17/2007   Hydradenitis    per pt currently treated with Humira injection   Hypertension    Hyperthyroidism    Hypocupremia    Hypothyroidism    Hypothyroidism, postsurgical 1980   IBS (irritable bowel syndrome)    Insomnia    Iron deficiency    Joint pain    Leukopenia     Lumbosacral radiculopathy    Lymphocytic colitis 05/14/2022   dx from colonoscopy   Medial meniscus tear    right knee   Mild obstructive sleep apnea    Mitral regurgitation    Mild to moderate by echo 09/2021   Numbness and tingling of right arm    Obesity    Osteoarthritis    Palpitations    Pernicious anemia    b12 def   Peroneal neuropathy    PONV (postoperative nausea and vomiting)    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   Prediabetes    Reactive hypoglycemia followed by pcp   post gastrectomy dumping syndrome   SOB (shortness of breath)    Spinal stenosis    Tendonitis    right shoulder   Varicose veins    Vertigo    Vitamin B 12 deficiency    Vitamin D deficiency    Past Surgical History:  Procedure Laterality Date   BREATH TEK H PYLORI N/A 05/03/2014   Procedure: BREATH TEK H PYLORI;  Surgeon: Wenda Low, MD;  Location: [facility];  Service: General;  Laterality: N/A;  CARDIOVASCULAR STRESS TEST  03/11/2013   dr Gloris Manchester turner   Low risk nuclear study w/ a very small anterior perfusion defect that most likely represents breast attenuation artifact, less likely this could represent a true perfusion abnormality in the distribution of diagonal branch of the LAD/  normal LV function and wall motion , ef 66%   CATARACT EXTRACTION W/ INTRAOCULAR LENS  IMPLANT, BILATERAL  10/2017   CESAREAN SECTION  1985   CHOLECYSTECTOMY OPEN  1987   COLONOSCOPY  02/2017   Dr Loreta Ave ; per patient they found 7 polyps with polypectomy; on 5 year track    ESOPHAGOGASTRODUODENOSCOPY  02/01/2021   FINE NEEDLE ASPIRATION Left 05/22/2017   Procedure: LEFT KNEE ASPIRATION;  Surgeon: Ollen Gross, MD;  Location: [facility];  Service: Orthopedics;  Laterality: Left;   GASTRIC ROUX-EN-Y N/A 07/06/2014   Procedure: LAPAROSCOPIC ROUX-EN-Y GASTRIC BYPASS, ENTEROLYSIS OF ADHESIONS WITH UPPER ENDOSCOPY;  Surgeon: Luretha Murphy, MD;  Location: [facility];  Service: General;  Laterality: N/A;   HAMMER  TOE SURGERY Bilateral 02/17/2008   bilateral foot digit's 2 and 5   HIATAL HERNIA REPAIR N/A 07/06/2014   Procedure: LAPAROSCOPIC REPAIR OF HIATAL HERNIA;  Surgeon: Luretha Murphy, MD;  Location: [facility];  Service: General;  Laterality: N/A;   I & D KNEE WITH POLY EXCHANGE Left 12/11/2019   Procedure: LEFT KNEE POLY-LINER EXCHANGE;  Surgeon: Kathryne Hitch, MD;  Location: [facility];  Service: Orthopedics;  Laterality: Left;   KNEE ARTHROSCOPY Right 12/11/2019   Procedure: RIGHT KNEE ARTHROSCOPY WITH PARTIAL MEDIAL MENISCECTOMY;  Surgeon: Kathryne Hitch, MD;  Location: [facility];  Service: Orthopedics;  Laterality: Right;   KNEE ARTHROSCOPY WITH MEDIAL MENISECTOMY Left 07/17/2017   Procedure: LEFT KNEE ARTHROSCOPY WITH MEDIAL LATERAL MENISECTOMY;  Surgeon: Ollen Gross, MD;  Location: [facility];  Service: Orthopedics;  Laterality: Left;   MASS EXCISION N/A 10/11/2016   Procedure: EXCISION OF PERIANAL MASS;  Surgeon: Romie Levee, MD;  Location: [facility];  Service: General;  Laterality: N/A;   PARTIAL HYSTERECTOMY  2006   has ovaries   REFRACTIVE SURGERY     THYROIDECTOMY  1980   TOTAL ABDOMINAL HYSTERECTOMY  08-25-2002   dr Myrlene Broker   ovaries retained   TOTAL HIP ARTHROPLASTY Right 05/21/2012   Procedure: TOTAL HIP ARTHROPLASTY;  Surgeon: Loanne Drilling, MD;  Location: [facility];  Service: Orthopedics;  Laterality: Right;   TOTAL HIP ARTHROPLASTY Left 01-31-2009  dr Lequita Halt   TOTAL HIP REVISION Left 05/22/2017   Procedure: Left hip bearing surface and head revision;  Surgeon: Ollen Gross, MD;  Location: [facility];  Service: Orthopedics;  Laterality: Left;   TOTAL KNEE ARTHROPLASTY Left 01/20/2018   Procedure: LEFT TOTAL KNEE ARTHROPLASTY;  Surgeon: Ollen Gross, MD;  Location: [facility];  Service: Orthopedics;  Laterality: Left;    TOTAL KNEE ARTHROPLASTY Right 07/13/2022   Procedure: RIGHTTOTAL KNEE ARTHROPLASTY;  Surgeon: Kathryne Hitch, MD;   Location: [facility];  Service: Orthopedics;  Laterality: Right;   TRANSTHORACIC ECHOCARDIOGRAM  05/03/2016   ef 55-60%/  trivial MR/  mild TR   TUBAL LIGATION     Patient Active Problem List   Diagnosis Date Noted   Polypharmacy 08/13/2022   Status post total right knee replacement 07/13/2022   Weight gain 07/12/2022   Lymphocytic colitis 07/12/2022   Migraine without aura and without status migrainosus, not intractable 07/02/2022   Chronic conjunctivitis of both eyes 06/28/2022   Generalized abdominal pain 05/24/2022  Chronic rhinitis 05/09/2022   Chronic migraine without aura without status migrainosus, not intractable 12/26/2021   Depressed mood 12/19/2021   Obstructive sleep apnea 12/19/2021   Other hypoglycemia-post bariatric 12/05/2021   Secondary hyperparathyroidism (HCC) 12/05/2021   Inadequate fluid intake 12/05/2021   Inadequate sleep hygiene 12/05/2021   Class 3 severe obesity with body mass index (BMI) of 40.0 to 44.9 in adult [facility]) 12/05/2021   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   SOBOE (shortness of breath on exertion) 11/21/2021   Other fatigue 11/21/2021   Vitamin D deficiency 11/21/2021   Iron deficiency 11/21/2021   Hypoglycemia after bariatric surgery 11/21/2021   Depression screening 11/21/2021   Class 3 severe obesity with serious comorbidity and body mass index (BMI) of 40.0 to 44.9 in adult (HCC) 11/21/2021   Mild obstructive sleep apnea 11/17/2021   Vertigo 11/17/2021   Excessive daytime sleepiness 10/23/2021   Spinal stenosis of lumbar region 07/07/2020   Displacement of lumbar intervertebral disc 06/02/2020   Lumbar spondylosis 06/02/2020   Lumbar stenosis with neurogenic claudication 06/02/2020   Arm numbness 05/30/2020   Chronic neck pain 05/30/2020   Other chronic pain 05/30/2020   Elevated blood-pressure reading, without diagnosis of hypertension 05/30/2020   Status post arthroscopy of right knee 12/11/2019   Polyethylene wear of left  knee joint prosthesis (HCC) 09/09/2019   Acute medial meniscal tear, right, subsequent encounter 08/27/2019   History of total left knee replacement 07/16/2019   Peroneal neuropathy at knee, left 06/25/2019   Lumbosacral radiculopathy 06/07/2019   Iron malabsorption 10/16/2018   IDA (iron deficiency anemia) 10/16/2018   OA (osteoarthritis) of knee 01/20/2018   Acute meniscal tear, medial 07/17/2017   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   Failed total hip arthroplasty, 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/2017   Pain in left knee 04/11/2017   Insomnia 11/08/2016   Pernicious anemia 11/08/2016   Leukopenia 07/09/2016   Hypocupremia 07/09/2016   Palpitations 04/12/2016   Shortness of breath 04/12/2016   Reactive hypoglycemia 10/31/2015   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/31/2015   Lap gastric bypass May 2016 07/06/2014   History of Clostridium difficile infection 08/12/2011   Post-surgical hypothyroidism 08/26/2010   Gastroesophageal reflux disease 08/26/2010   Vitamin D deficiency 08/26/2010   Fibrocystic disease of breast 08/26/2010   Cobalamin deficiency 08/26/2010   Morbid obesity (HCC) 03/20/2010   Backache 03/20/2010    PCP: Margaree Mackintosh, MD  REFERRING PROVIDER: Kathryne Hitch, MD  REFERRING DIAG: [phone] (ICD-10-CM) - Status post total right knee replacement  Rationale for Evaluation and Treatment: Rehabilitation  THERAPY DIAG:  Acute pain of right knee  Stiffness of right knee, not elsewhere classified  Muscle weakness (generalized)  Other abnormalities of gait and mobility  Localized edema  ONSET DATE: 07/13/22 (DOS)   SUBJECTIVE:  SUBJECTIVE STATEMENT: Knee continues to hurt but she is no longer taking the pain meds, she has not had any since Sunday.   PERTINENT HISTORY:   PMH includes: obesity, gastric bypass, L THA revision, L TKA, OSA, spinal stenosis  PAIN:  Are you having pain? Yes: NPRS scale: 6 currently, /10 Pain location: Rt knee Pain description: throbbing, aching, occasional shocks Aggravating factors: end ranges; increased activity Relieving factors: repositioning, icing  PRECAUTIONS:  None  WEIGHT BEARING RESTRICTIONS:  No  FALLS:  Has patient fallen in last 6 months? No  LIVING ENVIRONMENT: Lives with: lives with their spouse Lives in: House/apartment Stairs: Yes: Internal: flight steps; has stair lift and External: 5 steps; can reach both (2 steps in garage; no railing) Has following equipment at home: Retail banker - 2 wheeled  OCCUPATION:  Charity fundraiser at [employer]  PLOF:  Independent and Leisure: walking in the park  PATIENT GOALS:  Regain use of RLE, improve motion in knee   OBJECTIVE: (objective measures completed at initial evaluation unless otherwise dated)   PATIENT SURVEYS:  07/31/22: FOTO 34 (predicted 51)  COGNITIVE STATUS: Within functional limits for tasks assessed   RED FLAGS: None    SENSATION: 07/31/22: Not tested  POSTURE:  rounded shoulders and forward head   GAIT: 07/31/22 Distance walked: 100' in clinic Assistive device utilized: Environmental consultant - 2 wheeled Level of assistance: Modified independence Comments: antalgic gait with decreased stance on Rt , decreased hip/knee flexion   Knee  LOWER EXTREMITY ROM:     ROM Right eval Left eval Right LE ROM 08/03/22 Right 08/06/22 Right 08/14/22  Knee flexion A: 65 P:71 A: 100 Seated edge of mat, post manual  A: 79 deg P: 82 deg  A: 77 P: 86 A:  P:   Knee extension A: -2 (seated LAQ) P: 0 A: 4 (hyper) 0 deg A/PROM with mild pain at end range A: 0  (seated LAQ)    (Blank rows = not tested)   LOWER EXTREMITY MMT:    MMT Right eval Left eval  Knee flexion 3-/5   Knee extension 3-/5    (Blank rows = not tested)   TREATMENT:                                                                                                                               DATE:  08/16/22 TherEx SciFit bike seat 15; able to progress to full revolutions x 8 min Seated LAQ 3# 3x10; 3 sec hold at end ranges on Rt Sit to/from stand x 10 reps; from elevated  Seated SLR 2X10 on Rt Seated knee flexion AAROM stretch 5 sec  x 10 reps Leg press DL 29# 5A21, then Rt leg only 25# X10, stretching into max flexion 2-3 sec each rep.   Gait Training Amb with SPC with supervision throughout clinic durin session - safe with household and limited community ambulation  Manual Seated Rt  knee flexion PROM to tolerance x 8 min  08/14/22 TherEx SciFit bike seat 15; partial revolutions x 8 min Seated LAQ 3# 3x10; 3 sec hold at end ranges on Rt Sit to/from stand x 10 reps; from elevated  Heel slides x 10 reps with strap  Gait Training Amb with SPC with supervision and min cues for sequencing - safe with household and limited community ambulation  Manual Seated Rt knee flexion PROM to tolerance x 8 min  Modalities Vaso x 10 min to Rt knee, mod pressure 34 deg  08/10/22 TherEx SciFit bike seat 15; partial revolutions x 8 min Tailgate flexion x 3 min AA Rt knee flexion 10 x 10 sec hold; LLE providing the assist Sit to/from stand x 5 reps from elevated mat Seated LAQ 3# 3x10  Manual Seated Rt knee flexion PROM to tolerance x 8 min  Modalities Vaso x 10 min to Rt knee, mod pressure 34 deg    PATIENT EDUCATION:  Education details: rationale for interventions, HEP  Person educated: Patient Education method: Explanation, Demonstration, and Handouts Education comprehension: verbalized understanding, returned demonstration, and needs further education  HOME EXERCISE PROGRAM: Access Code: [email or web address] URL: [email or web address] Date: 07/31/2022 Prepared by: Moshe Cipro  Exercises - Seated Knee Flexion AAROM  - 5-10 x daily - 7 x  weekly - 1 sets - 5-10 reps - 5 sec hold - Supine Heel Slide with Strap  - 5-10 x daily - 7 x weekly - 1 sets - 5-10 reps - Quad Set  - 5-10 x daily - 7 x weekly - 1 sets - 5-10 reps - 5 sec hold - Seated Long Arc Quad  - 2-3 x daily - 7 x weekly - 1-2 sets - 10 reps - Seated Straight Leg Raise  - 2-3 x daily - 7 x weekly - 1-2 sets - 10 reps   ASSESSMENT:  CLINICAL IMPRESSION: We progressed her overall Rt knee strength and stretching program with good overall tolerance. She his making progress and was able to do full revolutions on sci fit bike today with her knee. Continue current plan of care.  Per eval - Patient is a 62 y.o. female who was seen today for physical therapy evaluation and treatment for Rt TKA on 07/13/22. She demonstrates decreased strength ROM and balance, as well as gait abnormalities and expected post op pain and swelling affecting functional mobility.  She will benefit from PT to address deficits listed.    OBJECTIVE IMPAIRMENTS: Abnormal gait, decreased activity tolerance, decreased balance, decreased knowledge of use of DME, decreased mobility, difficulty walking, decreased ROM, decreased strength, hypomobility, increased fascial restrictions, increased muscle spasms, and pain.   ACTIVITY LIMITATIONS: carrying, lifting, bending, sitting, standing, squatting, sleeping, stairs, transfers, and locomotion level  PARTICIPATION LIMITATIONS: meal prep, cleaning, laundry, driving, shopping, community activity, occupation, and yard work  PERSONAL FACTORS: 3+ comorbidities: obesity, gastric bypass, L THA revision, L TKA, OSA, spinal stenosis  are also affecting patient's functional outcome.   REHAB POTENTIAL: Good  CLINICAL DECISION MAKING: Evolving/moderate complexity  EVALUATION COMPLEXITY: Moderate   GOALS: Goals reviewed with patient? Yes  SHORT TERM GOALS: Target date: 08/28/2022  Independent with initial HEP Goal status: INITIAL  2.  Rt knee AROM improved 0-90  deg for improved function Goal status: INITIAL   LONG TERM GOALS: Target date: 09/25/2022  Independent with final HEP Goal status: INITIAL  2.  FOTO score improved to 51 Goal status: INITIAL  3.  Rt knee AROM improved to 0-100 for  improved function and mobility Goal status: INIITAL  4.  Report pain < 3/10 with amb and standing activities for improved function Goal status: INITIAL  5.  Amb with LRAD modified independently for improved function and mobility Goal status: INITIAL    PLAN:  PT FREQUENCY: 2x/week  PT DURATION: 8 weeks  PLANNED INTERVENTIONS: Therapeutic exercises, Therapeutic activity, Neuromuscular re-education, Balance training, Gait training, Patient/Family education, Self Care, Joint mobilization, Stair training, DME instructions, Aquatic Therapy, Dry Needling, Electrical stimulation, Cryotherapy, Moist heat, Taping, Vasopneumatic device, Manual therapy, and Re-evaluation.  PLAN FOR NEXT SESSION: check STGs, amb with cane,  monitor pain levels, aggressive focus on flexion as pt can tolerate, continue quad activation.     NEXT MD VISIT: 09/05/22  Ivery Quale, PT, DPT 08/16/22 11:57 AM

## 2022-08-17 ENCOUNTER — Inpatient Hospital Stay (HOSPITAL_BASED_OUTPATIENT_CLINIC_OR_DEPARTMENT_OTHER): Payer: 59 | Admitting: Family

## 2022-08-17 ENCOUNTER — Other Ambulatory Visit (HOSPITAL_COMMUNITY): Payer: Self-pay

## 2022-08-17 ENCOUNTER — Other Ambulatory Visit: Payer: Self-pay

## 2022-08-17 ENCOUNTER — Inpatient Hospital Stay: Payer: 59 | Attending: Hematology & Oncology

## 2022-08-17 ENCOUNTER — Encounter: Payer: Self-pay | Admitting: Family

## 2022-08-17 ENCOUNTER — Ambulatory Visit: Payer: 59 | Admitting: Nurse Practitioner

## 2022-08-17 VITALS — BP 153/99 | HR 68 | Temp 98.1°F | Resp 18 | Ht 67.0 in | Wt 275.0 lb

## 2022-08-17 DIAGNOSIS — K912 Postsurgical malabsorption, not elsewhere classified: Secondary | ICD-10-CM | POA: Insufficient documentation

## 2022-08-17 DIAGNOSIS — G4733 Obstructive sleep apnea (adult) (pediatric): Secondary | ICD-10-CM | POA: Diagnosis not present

## 2022-08-17 DIAGNOSIS — Z9884 Bariatric surgery status: Secondary | ICD-10-CM | POA: Diagnosis not present

## 2022-08-17 DIAGNOSIS — D508 Other iron deficiency anemias: Secondary | ICD-10-CM | POA: Insufficient documentation

## 2022-08-17 LAB — CBC WITH DIFFERENTIAL (CANCER CENTER ONLY)
Abs Immature Granulocytes: 0.01 10*3/uL (ref 0.00–0.07)
Basophils Absolute: 0 10*3/uL (ref 0.0–0.1)
Basophils Relative: 0 %
Eosinophils Absolute: 0 10*3/uL (ref 0.0–0.5)
Eosinophils Relative: 0 %
HCT: 38.9 % (ref 36.0–46.0)
Hemoglobin: 12.4 g/dL (ref 12.0–15.0)
Immature Granulocytes: 0 %
Lymphocytes Relative: 26 %
Lymphs Abs: 1.2 10*3/uL (ref 0.7–4.0)
MCH: 32.1 pg (ref 26.0–34.0)
MCHC: 31.9 g/dL (ref 30.0–36.0)
MCV: 100.8 fL — ABNORMAL HIGH (ref 80.0–100.0)
Monocytes Absolute: 0.3 10*3/uL (ref 0.1–1.0)
Monocytes Relative: 7 %
Neutro Abs: 3 10*3/uL (ref 1.7–7.7)
Neutrophils Relative %: 67 %
Platelet Count: 147 10*3/uL — ABNORMAL LOW (ref 150–400)
RBC: 3.86 MIL/uL — ABNORMAL LOW (ref 3.87–5.11)
RDW: 14 % (ref 11.5–15.5)
WBC Count: 4.6 10*3/uL (ref 4.0–10.5)
nRBC: 0 % (ref 0.0–0.2)

## 2022-08-17 LAB — RETICULOCYTES
Immature Retic Fract: 15.8 % (ref 2.3–15.9)
RBC.: 3.8 MIL/uL — ABNORMAL LOW (ref 3.87–5.11)
Retic Count, Absolute: 79 10*3/uL (ref 19.0–186.0)
Retic Ct Pct: 2.1 % (ref 0.4–3.1)

## 2022-08-17 LAB — FERRITIN: Ferritin: 58 ng/mL (ref 11–307)

## 2022-08-17 NOTE — Progress Notes (Signed)
Hematology and Oncology Follow Up Visit  Victoria [name] [id number] [birth year] 61 y.o. 08/17/2022   Principle Diagnosis:  Iron deficiency anemia secondary to malabsorption, s/p gastric bypass in 2016    Current Therapy:        IV iron as indicated    Interim History:  Victoria [name] is here today for follow-up. 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  She has had issues with pain control and has discussed with ortho.  She notes mild swelling in the right knee and ankle post surgery.  No falls or syncope reported.  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  No fever, chills, n/v, cough, rash, dizziness, SOB, chest pain, palpitations, abdominal pain or changes in bowel or bladder habits.  Appetite is down. She is doing her best to stay well hydrated. Her weight is 275 lbs.   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 If you have any questions, ask your nurse or doctor.          albuterol 108 (90 Base) MCG/ACT inhaler Commonly known as: VENTOLIN HFA Inhale 2 puffs into the lungs every 4 hours as needed for wheezing or shortness of breath.   ALPRAZolam 0.5 MG tablet Commonly known as: XANAX Take 1 tablet (0.5 mg total) by mouth in the morning AND 1 tablet (0.5 mg total) daily at 12 noon AND 2 tablets (1 mg total) at bedtime.   aspirin EC 81 MG tablet Take 81 mg by mouth 2 (two) times daily. Swallow whole.   Carbinoxamine Maleate 4 MG Tabs Take 1-2 tablets at night as needed for drainage and allergies. May cause drowsiness.   celecoxib 200 MG capsule Commonly known as:  CELEBREX Take 1 capsule (200 mg total) by mouth 2 (two) times daily as needed.   clobetasol ointment 0.05 % Commonly known as: TEMOVATE Apply to skin twice daily as directed for up to 2 weeks as needed   cyanocobalamin 1000 MCG/ML injection Commonly known as: Dodex INJECT 1 ML INTRAMUSCULARLY EVERY MONTH   Eliquis 2.5 MG Tabs tablet Generic drug: apixaban Take 1 tablet (2.5 mg total) by mouth every 12 (twelve) hours.   Emgality 120 MG/ML Soaj Generic drug: Galcanezumab-gnlm Inject 120 mg into the skin every 30 (thirty) days.   escitalopram 10 MG tablet Commonly known as: Lexapro Take 1 tablet (10 mg total) by mouth daily.   Estradiol 1 MG/GM Gel Commonly known as: Divigel Place 1 Package onto the skin daily.   flecainide 50 MG tablet Commonly known as: TAMBOCOR Take 1 tablet (50 mg total) by mouth daily at 6 (six) AM.   gabapentin 600 MG tablet Commonly known as: NEURONTIN Take 1 tablet (600 mg total) by mouth 3 (three) times daily. What changed:  when to take this additional instructions   Humira (2 Pen) 40 MG/0.8ML Pnkt pen Generic drug: adalimumab Inject 1 pen subcutaneously every week What changed: when to take this   ipratropium 0.03 % nasal spray Commonly known as: ATROVENT Use 1-2 sprays in each nostril  up to 3 times daily as needed for runny nose. Can take 15 minutes prior to meals.   levocetirizine 5 MG tablet Commonly known as: XYZAL Take 1 tablet (5 mg total) by mouth every evening.   levothyroxine 112 MCG tablet Commonly known as: SYNTHROID Take 1 tablet (112 mcg total) by mouth daily.   magic mouthwash (nystatin, hydrocortisone, diphenhydrAMINE) suspension Swish and spit 30 mLs 4 (four) times daily.   metoprolol succinate 25 MG 24 hr tablet Commonly known as: TOPROL-XL Take 1 tablet (25 mg total) by mouth daily.   montelukast 10 MG tablet Commonly known as: Singulair Take 1 tablet (10 mg total) by mouth at bedtime.   nystatin 100000  UNIT/ML suspension Commonly known as: MYCOSTATIN Take by mouth.   Olopatadine HCl 0.2 % Soln Apply 1 drop to eye daily as needed. What changed: reasons to take this   ondansetron 4 MG disintegrating tablet Commonly known as: ZOFRAN-ODT Take 1 tablet (4 mg total) by mouth every 8 (eight) hours as needed for nausea or vomiting.   oxyCODONE 5 MG immediate release tablet Commonly known as: Roxicodone Take 1-2 tablets (5-10 mg total) by mouth every 6 (six) hours as needed for severe pain.   pantoprazole 40 MG tablet Commonly known as: PROTONIX Take 1 tablet by mouth daily 20 minutes before breakfast What changed:  how much to take when to take this reasons to take this   promethazine 25 MG tablet Commonly known as: PHENERGAN Take 1 tablet (25 mg total) by mouth every 8 (eight) hours as needed for N/V.   sulfamethoxazole-trimethoprim 800-160 MG tablet Commonly known as: Bactrim DS Take 1 tablet by mouth 2 (two) times daily as needed for flare   terconazole 0.4 % vaginal cream Commonly known as: TERAZOL 7 Place 1 applicatorful vaginally at bedtime for seven nights.   Then use one applicatorful once a week for 6 months.   tiZANidine 4 MG capsule Commonly known as: Zanaflex Take 1 capsule (4 mg total) by mouth 3 (three) times daily as needed for muscle spasms.   triamcinolone cream 0.1 % Commonly known as: KENALOG Apply 1 Application topically 2 (two) times daily. Use for 2 weeks, and then off for 2 weeks as needed.   Ubrelvy 100 MG Tabs Generic drug: Ubrogepant Take 1 tablet (100 mg total) by mouth every 2 (two) hours as needed. Maximum 200mg  a day.   Vitamin D (Ergocalciferol) 1.25 MG (50000 UNIT) Caps capsule Commonly known as: DRISDOL Take 1 capsule (50,000 Units total) by mouth 2 (two) times a week for Vitamin deficiency and gastric bypass.        Allergies:  Allergies  Allergen Reactions   Other Other (See Comments)    Developed metal toxicity from a hip  replacement gone awry   Estradiol Rash and Other (See Comments)    Local rash with estradiol patches    Past Medical History, Surgical history, Social history, and Family History were reviewed and updated.  Review of Systems: All other 10 point review of systems is negative.   Physical Exam:  height is 5\' 7"  (1.702 m) and weight is 275 lb (124.7 kg). Her oral temperature is 98.1 F (36.7 C). Her blood pressure is 153/99 (abnormal) and her pulse is 68. Her respiration is 18 and oxygen saturation is 100%.   Wt Readings from Last 3 Encounters:  08/17/22 275 lb (124.7 kg)  08/13/22 276 lb (125.2 kg)  07/19/22 279 lb (126.6 kg)    Ocular: Sclerae unicteric,  pupils equal, round and reactive to light Ear-nose-throat: Oropharynx clear, dentition fair Lymphatic: No cervical or supraclavicular adenopathy Lungs no rales or rhonchi, good excursion bilaterally Heart regular rate and rhythm, no murmur appreciated Abd soft, nontender, positive bowel sounds MSK no focal spinal tenderness, no joint edema Neuro: non-focal, well-oriented, appropriate affect Breasts: Deferred   Lab Results  Component Value Date   WBC 4.6 08/17/2022   HGB 12.4 08/17/2022   HCT 38.9 08/17/2022   MCV 100.8 (H) 08/17/2022   PLT 147 (L) 08/17/2022   Lab Results  Component Value Date   FERRITIN 21 01/12/2022   IRON 123 01/12/2022   TIBC 343 01/12/2022   UIBC 220 01/12/2022   IRONPCTSAT 36 (H) 01/12/2022   Lab Results  Component Value Date   RETICCTPCT 2.1 08/17/2022   RBC 3.80 (L) 08/17/2022   RETICCTABS 56,550 07/07/2021   No results found for: "KPAFRELGTCHN", "LAMBDASER", "KAPLAMBRATIO" Lab Results  Component Value Date   IGGSERUM 1,609 (H) 05/16/2020   IGMSERUM 106 05/16/2020   No results found for: "TOTALPROTELP", "ALBUMINELP", "A1GS", "A2GS", "BETS", "BETA2SER", "GAMS", "MSPIKE", "SPEI"   Chemistry      Component Value Date/Time   NA 137 07/18/2022 1300   NA 141 05/24/2022 1122   K 4.1  07/18/2022 1300   CL 105 07/18/2022 1300   CO2 20 (L) 07/18/2022 1300   BUN 14 07/18/2022 1300   BUN 20 05/24/2022 1122   CREATININE 1.11 (H) 07/18/2022 1300   CREATININE 1.23 (H) 07/09/2022 1132      Component Value Date/Time   CALCIUM 8.5 (L) 07/18/2022 1300   ALKPHOS 102 07/18/2022 1300   AST 35 07/18/2022 1300   AST 26 10/16/2018 0903   ALT 43 07/18/2022 1300   ALT 26 10/16/2018 0903   BILITOT 1.0 07/18/2022 1300   BILITOT <0.2 05/24/2022 1122   BILITOT 0.5 10/16/2018 2841       Impression and Plan: Ms. Defibaugh is a very pleasant 62 yo African American female with iron deficiency anemia secondary to malabsorption since laparoscopic gastric bypass in 2016.  Iron studies are pending.  Follow-up in 1 year.    Eileen Stanford, NP 6/21/20242:39 PM

## 2022-08-20 ENCOUNTER — Encounter: Payer: Self-pay | Admitting: Physical Therapy

## 2022-08-20 ENCOUNTER — Ambulatory Visit (INDEPENDENT_AMBULATORY_CARE_PROVIDER_SITE_OTHER): Payer: 59 | Admitting: Physical Therapy

## 2022-08-20 DIAGNOSIS — M25661 Stiffness of right knee, not elsewhere classified: Secondary | ICD-10-CM

## 2022-08-20 DIAGNOSIS — R600 Localized edema: Secondary | ICD-10-CM | POA: Diagnosis not present

## 2022-08-20 DIAGNOSIS — R2689 Other abnormalities of gait and mobility: Secondary | ICD-10-CM

## 2022-08-20 DIAGNOSIS — M25561 Pain in right knee: Secondary | ICD-10-CM | POA: Diagnosis not present

## 2022-08-20 DIAGNOSIS — M6281 Muscle weakness (generalized): Secondary | ICD-10-CM | POA: Diagnosis not present

## 2022-08-20 LAB — IRON AND IRON BINDING CAPACITY (CC-WL,HP ONLY)
Iron: 84 ug/dL (ref 28–170)
Saturation Ratios: 19 % (ref 10.4–31.8)
TIBC: 444 ug/dL (ref 250–450)
UIBC: 360 ug/dL (ref 148–442)

## 2022-08-20 NOTE — Therapy (Signed)
OUTPATIENT PHYSICAL THERAPY TREATMENT NOTE    Patient Name: Victoria [name] MRN: [id number] DOB:07-25-1960, 62 y.o., female Today's Date: 08/20/2022  END OF SESSION:  PT End of Session - 08/20/22 1013     Visit Number 7    Number of Visits 16    Date for PT Re-Evaluation 09/25/22    Authorization Type [facility] [employer] $40 copay    PT Start Time 1010    PT Stop Time 1058    PT Time Calculation (min) 48 min    Activity Tolerance Patient tolerated treatment well;No increased pain    Behavior During Therapy [facility] for tasks assessed/performed                  Past Medical History:  Diagnosis Date   Anxiety    Back pain    Bursitis    right shoulder   Chronic kidney disease    hematuria, has been worked up, her normal   Chronic leukopenia    intermittant since 2010, followed by Dr. Lenord Fellers now   Chronic neck pain    Constipation    Degenerative joint disease of low back 09/02/2015   Dr. Despina Hick   Displacement of lumbar intervertebral disc    Dysrhythmia    Tachycardia, PVCs, PACs   Edema of both lower legs    Fibrocystic breast    Gallbladder problem    GERD (gastroesophageal reflux disease)    Headache    per pt more stress/tension   Heart palpitations    SEE EPIC ENCOUTNER , CARDIOLOGY DR. Gloris Manchester TURNER 2018; reports on 05-16-17 " i haven't had any bouts of those lately"     Hemorrhoids    Hidradenitis suppurativa    History of Clostridium difficile infection 12/2010   History of exercise intolerance    ETT on 04-27-2016-- negative ([facility] treadmill score 7)   History of Helicobacter pylori infection 2001 and 09/ 2012   HSV-2 infection    genital   Hx of adenomatous colonic polyps 10/17/2007   Hydradenitis    per pt currently treated with Humira injection   Hypertension    Hyperthyroidism    Hypocupremia    Hypothyroidism    Hypothyroidism, postsurgical 1980   IBS (irritable bowel syndrome)    Insomnia    Iron deficiency    Joint pain    Leukopenia     Lumbosacral radiculopathy    Lymphocytic colitis 05/14/2022   dx from colonoscopy   Medial meniscus tear    right knee   Mild obstructive sleep apnea    Mitral regurgitation    Mild to moderate by echo 09/2021   Numbness and tingling of right arm    Obesity    Osteoarthritis    Palpitations    Pernicious anemia    b12 def   Peroneal neuropathy    PONV (postoperative nausea and vomiting)    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   Prediabetes    Reactive hypoglycemia followed by pcp   post gastrectomy dumping syndrome   SOB (shortness of breath)    Spinal stenosis    Tendonitis    right shoulder   Varicose veins    Vertigo    Vitamin B 12 deficiency    Vitamin D deficiency    Past Surgical History:  Procedure Laterality Date   BREATH TEK H PYLORI N/A 05/03/2014   Procedure: BREATH TEK H PYLORI;  Surgeon: Wenda Low, MD;  Location: [facility];  Service: General;  Laterality: N/A;  CARDIOVASCULAR STRESS TEST  03/11/2013   dr Gloris Manchester turner   Low risk nuclear study w/ a very small anterior perfusion defect that most likely represents breast attenuation artifact, less likely this could represent a true perfusion abnormality in the distribution of diagonal branch of the LAD/  normal LV function and wall motion , ef 66%   CATARACT EXTRACTION W/ INTRAOCULAR LENS  IMPLANT, BILATERAL  10/2017   CESAREAN SECTION  1985   CHOLECYSTECTOMY OPEN  1987   COLONOSCOPY  02/2017   Dr Loreta Ave ; per patient they found 7 polyps with polypectomy; on 5 year track    ESOPHAGOGASTRODUODENOSCOPY  02/01/2021   FINE NEEDLE ASPIRATION Left 05/22/2017   Procedure: LEFT KNEE ASPIRATION;  Surgeon: Ollen Gross, MD;  Location: [facility];  Service: Orthopedics;  Laterality: Left;   GASTRIC ROUX-EN-Y N/A 07/06/2014   Procedure: LAPAROSCOPIC ROUX-EN-Y GASTRIC BYPASS, ENTEROLYSIS OF ADHESIONS WITH UPPER ENDOSCOPY;  Surgeon: Luretha Murphy, MD;  Location: [facility];  Service: General;  Laterality: N/A;   HAMMER  TOE SURGERY Bilateral 02/17/2008   bilateral foot digit's 2 and 5   HIATAL HERNIA REPAIR N/A 07/06/2014   Procedure: LAPAROSCOPIC REPAIR OF HIATAL HERNIA;  Surgeon: Luretha Murphy, MD;  Location: [facility];  Service: General;  Laterality: N/A;   I & D KNEE WITH POLY EXCHANGE Left 12/11/2019   Procedure: LEFT KNEE POLY-LINER EXCHANGE;  Surgeon: Kathryne Hitch, MD;  Location: [facility];  Service: Orthopedics;  Laterality: Left;   KNEE ARTHROSCOPY Right 12/11/2019   Procedure: RIGHT KNEE ARTHROSCOPY WITH PARTIAL MEDIAL MENISCECTOMY;  Surgeon: Kathryne Hitch, MD;  Location: [facility];  Service: Orthopedics;  Laterality: Right;   KNEE ARTHROSCOPY WITH MEDIAL MENISECTOMY Left 07/17/2017   Procedure: LEFT KNEE ARTHROSCOPY WITH MEDIAL LATERAL MENISECTOMY;  Surgeon: Ollen Gross, MD;  Location: [facility];  Service: Orthopedics;  Laterality: Left;   MASS EXCISION N/A 10/11/2016   Procedure: EXCISION OF PERIANAL MASS;  Surgeon: Romie Levee, MD;  Location: [facility];  Service: General;  Laterality: N/A;   PARTIAL HYSTERECTOMY  2006   has ovaries   REFRACTIVE SURGERY     THYROIDECTOMY  1980   TOTAL ABDOMINAL HYSTERECTOMY  08-25-2002   dr Myrlene Broker   ovaries retained   TOTAL HIP ARTHROPLASTY Right 05/21/2012   Procedure: TOTAL HIP ARTHROPLASTY;  Surgeon: Loanne Drilling, MD;  Location: [facility];  Service: Orthopedics;  Laterality: Right;   TOTAL HIP ARTHROPLASTY Left 01-31-2009  dr Lequita Halt   TOTAL HIP REVISION Left 05/22/2017   Procedure: Left hip bearing surface and head revision;  Surgeon: Ollen Gross, MD;  Location: [facility];  Service: Orthopedics;  Laterality: Left;   TOTAL KNEE ARTHROPLASTY Left 01/20/2018   Procedure: LEFT TOTAL KNEE ARTHROPLASTY;  Surgeon: Ollen Gross, MD;  Location: [facility];  Service: Orthopedics;  Laterality: Left;    TOTAL KNEE ARTHROPLASTY Right 07/13/2022   Procedure: RIGHTTOTAL KNEE ARTHROPLASTY;  Surgeon: Kathryne Hitch, MD;   Location: [facility];  Service: Orthopedics;  Laterality: Right;   TRANSTHORACIC ECHOCARDIOGRAM  05/03/2016   ef 55-60%/  trivial MR/  mild TR   TUBAL LIGATION     Patient Active Problem List   Diagnosis Date Noted   Polypharmacy 08/13/2022   Status post total right knee replacement 07/13/2022   Weight gain 07/12/2022   Lymphocytic colitis 07/12/2022   Migraine without aura and without status migrainosus, not intractable 07/02/2022   Chronic conjunctivitis of both eyes 06/28/2022   Generalized abdominal pain 05/24/2022  Chronic rhinitis 05/09/2022   Chronic migraine without aura without status migrainosus, not intractable 12/26/2021   Depressed mood 12/19/2021   Obstructive sleep apnea 12/19/2021   Other hypoglycemia-post bariatric 12/05/2021   Secondary hyperparathyroidism (HCC) 12/05/2021   Inadequate fluid intake 12/05/2021   Inadequate sleep hygiene 12/05/2021   Class 3 severe obesity with body mass index (BMI) of 40.0 to 44.9 in adult [facility]) 12/05/2021   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   SOBOE (shortness of breath on exertion) 11/21/2021   Other fatigue 11/21/2021   Vitamin D deficiency 11/21/2021   Iron deficiency 11/21/2021   Hypoglycemia after bariatric surgery 11/21/2021   Depression screening 11/21/2021   Class 3 severe obesity with serious comorbidity and body mass index (BMI) of 40.0 to 44.9 in adult (HCC) 11/21/2021   Mild obstructive sleep apnea 11/17/2021   Vertigo 11/17/2021   Excessive daytime sleepiness 10/23/2021   Spinal stenosis of lumbar region 07/07/2020   Displacement of lumbar intervertebral disc 06/02/2020   Lumbar spondylosis 06/02/2020   Lumbar stenosis with neurogenic claudication 06/02/2020   Arm numbness 05/30/2020   Chronic neck pain 05/30/2020   Other chronic pain 05/30/2020   Elevated blood-pressure reading, without diagnosis of hypertension 05/30/2020   Status post arthroscopy of right knee 12/11/2019   Polyethylene wear of left  knee joint prosthesis (HCC) 09/09/2019   Acute medial meniscal tear, right, subsequent encounter 08/27/2019   History of total left knee replacement 07/16/2019   Peroneal neuropathy at knee, left 06/25/2019   Lumbosacral radiculopathy 06/07/2019   Iron malabsorption 10/16/2018   IDA (iron deficiency anemia) 10/16/2018   OA (osteoarthritis) of knee 01/20/2018   Acute meniscal tear, medial 07/17/2017   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   Failed total hip arthroplasty, 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/2017   Pain in left knee 04/11/2017   Insomnia 11/08/2016   Pernicious anemia 11/08/2016   Leukopenia 07/09/2016   Hypocupremia 07/09/2016   Palpitations 04/12/2016   Shortness of breath 04/12/2016   Reactive hypoglycemia 10/31/2015   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/31/2015   Lap gastric bypass May 2016 07/06/2014   History of Clostridium difficile infection 08/12/2011   Post-surgical hypothyroidism 08/26/2010   Gastroesophageal reflux disease 08/26/2010   Vitamin D deficiency 08/26/2010   Fibrocystic disease of breast 08/26/2010   Cobalamin deficiency 08/26/2010   Morbid obesity (HCC) 03/20/2010   Backache 03/20/2010    PCP: Margaree Mackintosh, MD  REFERRING PROVIDER: Kathryne Hitch, MD  REFERRING DIAG: [phone] (ICD-10-CM) - Status post total right knee replacement  Rationale for Evaluation and Treatment: Rehabilitation  THERAPY DIAG:  Acute pain of right knee  Stiffness of right knee, not elsewhere classified  Muscle weakness (generalized)  Other abnormalities of gait and mobility  Localized edema  ONSET DATE: 07/13/22 (DOS)   SUBJECTIVE:  SUBJECTIVE STATEMENT: Has knee high stockings on which seems to help with swelling; pain slowly improving.  PERTINENT HISTORY:  PMH includes:  obesity, gastric bypass, L THA revision, L TKA, OSA, spinal stenosis  PAIN:  Are you having pain? Yes: NPRS scale: 5 currently, /10 Pain location: Rt knee Pain description: throbbing, aching, occasional shocks Aggravating factors: end ranges; increased activity Relieving factors: repositioning, icing  PRECAUTIONS:  None  WEIGHT BEARING RESTRICTIONS:  No  FALLS:  Has patient fallen in last 6 months? No  LIVING ENVIRONMENT: Lives with: lives with their spouse Lives in: House/apartment Stairs: Yes: Internal: flight steps; has stair lift and External: 5 steps; can reach both (2 steps in garage; no railing) Has following equipment at home: Retail banker - 2 wheeled  OCCUPATION:  Charity fundraiser at [employer]  PLOF:  Independent and Leisure: walking in the park  PATIENT GOALS:  Regain use of RLE, improve motion in knee   OBJECTIVE: (objective measures completed at initial evaluation unless otherwise dated)   PATIENT SURVEYS:  07/31/22: FOTO 34 (predicted 51)  COGNITIVE STATUS: Within functional limits for tasks assessed   RED FLAGS: None    SENSATION: 07/31/22: Not tested  POSTURE:  rounded shoulders and forward head   GAIT: 07/31/22 Distance walked: 100' in clinic Assistive device utilized: Environmental consultant - 2 wheeled Level of assistance: Modified independence Comments: antalgic gait with decreased stance on Rt , decreased hip/knee flexion   Knee  LOWER EXTREMITY ROM:     ROM Right eval Left eval Right LE ROM 08/03/22 Right 08/06/22 Right 08/20/22  Knee flexion A: 65 P:71 A: 100 Seated edge of mat, post manual  A: 79 deg P: 82 deg  A: 77 P: 86 A: 99 P: 100  Knee extension A: -2 (seated LAQ) P: 0 A: 4 (hyper) 0 deg A/PROM with mild pain at end range A: 0  (seated LAQ) A: 0  (seated LAQ)   (Blank rows = not tested)   LOWER EXTREMITY MMT:    MMT Right eval Left eval  Knee flexion 3-/5   Knee extension 3-/5    (Blank rows = not tested)   TREATMENT:                                                                                                                               DATE:  08/20/22 TherEx SciFit bike seat 15; able to progress to full revolutions x 8 min Leg press DL 25# [id number], then Rt leg only 31# 3X10 Forward step up onto 6" step 2x10 leading with RLE Lateral step ups onto 6" step 2x10 leading with RLE Seated LAQ 4# 3x10; 3 sec hold at end ranges on Rt AA heel slides x 10 reps ROM measurements - see above for details Standing hip extension and abduction 2x10 each; L3 band   08/16/22 TherEx SciFit bike seat 15; able to progress to full revolutions x 8 min Seated LAQ  3# 3x10; 3 sec hold at end ranges on Rt Sit to/from stand x 10 reps; from elevated  Seated SLR 2X10 on Rt Seated knee flexion AAROM stretch 5 sec  x 10 reps Leg press DL 96# 2X52, then Rt leg only 25# X10, stretching into max flexion 2-3 sec each rep.   Gait Training Amb with SPC with supervision throughout clinic durin session - safe with household and limited community ambulation  Manual Seated Rt knee flexion PROM to tolerance x 8 min  08/14/22 TherEx SciFit bike seat 15; partial revolutions x 8 min Seated LAQ 3# 3x10; 3 sec hold at end ranges on Rt Sit to/from stand x 10 reps; from elevated  Heel slides x 10 reps with strap  Gait Training Amb with SPC with supervision and min cues for sequencing - safe with household and limited community ambulation  Manual Seated Rt knee flexion PROM to tolerance x 8 min  Modalities Vaso x 10 min to Rt knee, mod pressure 34 deg    PATIENT EDUCATION:  Education details: rationale for interventions, HEP  Person educated: Patient Education method: Programmer, multimedia, Demonstration, and Handouts Education comprehension: verbalized understanding, returned demonstration, and needs further education  HOME EXERCISE PROGRAM: Access Code: [email or web address] URL: [email or web address] Date: 08/20/2022 Prepared by:  Moshe Cipro  Exercises - Seated Knee Flexion AAROM  - 5-10 x daily - 7 x weekly - 1 sets - 5-10 reps - 5 sec hold - Supine Heel Slide with Strap  - 5-10 x daily - 7 x weekly - 1 sets - 5-10 reps - Quad Set  - 5-10 x daily - 7 x weekly - 1 sets - 5-10 reps - 5 sec hold - Seated Straight Leg Raise  - 2-3 x daily - 7 x weekly - 1-2 sets - 10 reps - Sit to Stand  - 2 x daily - 7 x weekly - 1 sets - 10 reps - Standing Hip Abduction with Resistance at Ankles and Counter Support  - 1 x daily - 7 x weekly - 2 sets - 10 reps - Standing Hip Extension with Resistance at Ankles and Counter Support  - 1 x daily - 7 x weekly - 2 sets - 10 reps   ASSESSMENT:  CLINICAL IMPRESSION: Pt has met both STGs and overall doing well with PT s/p TKA.  She will continue to benefit from PT to maximize function.    Per eval - Patient is a 62 y.o. female who was seen today for physical therapy evaluation and treatment for Rt TKA on 07/13/22. She demonstrates decreased strength ROM and balance, as well as gait abnormalities and expected post op pain and swelling affecting functional mobility.  She will benefit from PT to address deficits listed.    OBJECTIVE IMPAIRMENTS: Abnormal gait, decreased activity tolerance, decreased balance, decreased knowledge of use of DME, decreased mobility, difficulty walking, decreased ROM, decreased strength, hypomobility, increased fascial restrictions, increased muscle spasms, and pain.   ACTIVITY LIMITATIONS: carrying, lifting, bending, sitting, standing, squatting, sleeping, stairs, transfers, and locomotion level  PARTICIPATION LIMITATIONS: meal prep, cleaning, laundry, driving, shopping, community activity, occupation, and yard work  PERSONAL FACTORS: 3+ comorbidities: obesity, gastric bypass, L THA revision, L TKA, OSA, spinal stenosis  are also affecting patient's functional outcome.   REHAB POTENTIAL: Good  CLINICAL DECISION MAKING: Evolving/moderate  complexity  EVALUATION COMPLEXITY: Moderate   GOALS: Goals reviewed with patient? Yes  SHORT TERM GOALS: Target date: 08/28/2022  Independent with initial HEP Goal status: MET 08/20/22  2.  Rt knee AROM improved 0-90 deg for improved function Goal status: MET 08/20/22   LONG TERM GOALS: Target date: 09/25/2022  Independent with final HEP Goal status: INITIAL  2.  FOTO score improved to 51 Goal status: INITIAL  3.  Rt knee AROM improved to 0-100 for improved function and mobility Goal status: INIITAL  4.  Report pain < 3/10 with amb and standing activities for improved function Goal status: INITIAL  5.  Amb with LRAD modified independently for improved function and mobility Goal status: INITIAL    PLAN:  PT FREQUENCY: 2x/week  PT DURATION: 8 weeks  PLANNED INTERVENTIONS: Therapeutic exercises, Therapeutic activity, Neuromuscular re-education, Balance training, Gait training, Patient/Family education, Self Care, Joint mobilization, Stair training, DME instructions, Aquatic Therapy, Dry Needling, Electrical stimulation, Cryotherapy, Moist heat, Taping, Vasopneumatic device, Manual therapy, and Re-evaluation.  PLAN FOR NEXT SESSION: amb with cane,  monitor pain levels, aggressive focus on flexion as pt can tolerate, continue quad activation.     NEXT MD VISIT: 09/05/22    Clarita Crane, PT, DPT 08/20/22 11:08 AM

## 2022-08-21 ENCOUNTER — Telehealth: Payer: Self-pay | Admitting: *Deleted

## 2022-08-21 ENCOUNTER — Other Ambulatory Visit (HOSPITAL_COMMUNITY): Payer: Self-pay

## 2022-08-21 ENCOUNTER — Telehealth (INDEPENDENT_AMBULATORY_CARE_PROVIDER_SITE_OTHER): Payer: 59 | Admitting: Nurse Practitioner

## 2022-08-21 DIAGNOSIS — F5101 Primary insomnia: Secondary | ICD-10-CM | POA: Diagnosis not present

## 2022-08-21 DIAGNOSIS — G4733 Obstructive sleep apnea (adult) (pediatric): Secondary | ICD-10-CM

## 2022-08-21 MED ORDER — ZOLPIDEM TARTRATE 5 MG PO TABS
5.0000 mg | ORAL_TABLET | Freq: Every evening | ORAL | 0 refills | Status: DC | PRN
Start: 2022-08-21 — End: 2022-08-24
  Filled 2022-08-21 (×2): qty 7, 7d supply, fill #0

## 2022-08-21 NOTE — Progress Notes (Signed)
Patient ID: Victoria [name], female     DOB: February 21, 1961, 62 y.o.      MRN: [id number]  Chief Complaint  Patient presents with   Follow-up    Pt has not been using cpap. Pt just had surgery. She did not like the mask she had. She started the f120 face mask. Pt did not like the soft touch mask but it was not sealing. Her new mask will be in by this week     Virtual Visit via Video Note  I connected with Victoria [name] on 08/29/22 at  8:30 AM EDT by a video enabled telemedicine application and verified that I am speaking with the correct person using two identifiers.  Location: Patient: Home Provider: Office   I discussed the limitations of evaluation and management by telemedicine and the availability of in person appointments. The patient expressed understanding and agreed to proceed.  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 Past medical history significant for GERD, hypothyroidism, OA, anxiety, obesity s/p gastric bypass, pernicious anemia, insomnia, IDA.   TEST/EVENTS:  11/09/2021 HST: AHI 11.7, mild OSA 02/15/2022 PFTs: FVC 82, FEV1 91, ratio 86, TLC 91, DLCOcor 93. No BD. Normal PFTs   10/23/2021: OV with Myrakle Wingler NP for sleep consult referred by Dr. Mayford Knife for excessive daytime fatigue symptoms. She has been struggling with her fatigue for many years now; however, she had COVID in May and feels like her symptoms have been worse since then. She's also having trouble staying asleep at night. She will wake up after about 3-4 hours and struggles to get back to sleep. Sometimes she just gets out of the bed to start her day. Her husband tells her that she snores loudly. She wakes up in the morning with a headache and dry mouth. She denies waking up gasping, witnessed apneas, sleep parasomnias/paralysis, drowsy driving, cataplexy.  She goes to bed around 6 PM.  Sometimes a few hours later.  Sleep onset varies;  sometimes she has no difficulties and other times, it takes her a little while. She does take Xanax to help her fall asleep at night.  Does not necessarily feel anxious but she feels like her mind races.  Wakes up several times throughout the night; often to use the restroom.  Officially gets out of bed in the morning anytime between 1 AM and 5:15 AM. She has cut back on her caffeine intake throughout the day.  No previous sleep study Had gastric bypass in 2016, lost a significant amount of weight; however, she has slowly put weight back on over the past few years and is up about 40 pounds. 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 Never smoker. She does not drink alcohol. Lives at home with her husband. She has a history of atrial tachycardia and followed by cardiology. She also sees Dr. Maurine Minister with asthma/allergy for chronic cough and rhinitis. Her sister has sleep apnea and sleeps with CPAP. Home sleep study ordered.   11/17/2021: OV with Maceo Hernan NP for follow-up to discuss home sleep study results.  Her sleep study revealed AHI of 11.7/h consistent with mild OSA.  She continues to struggle with daytime fatigue symptoms and restless sleep at night.  She has had trouble staying asleep for a long time now and usually wakes up after 3 to 4 hours.  She has also been told that she snores loudly.  Commonly wakes up in the morning with a  headache or dry mouth.  She denies any sleep parasomnia/paralysis, drowsy driving.  No narcolepsy or cataplexy symptoms. Set up on auto CPAP 5-15 She recently had some trouble with dizziness.  Feels like it may be vertigo.  It happened yesterday and has not happened since.  She denied any palpitations, syncope, headaches, vision changes. She also tells me that she has been struggling with shortness of breath over the past few months.  She had COVID in May and feels like her breathing has not been the greatest since.  She feels like she gets more winded with activity.  She does have an  albuterol inhaler which she will use occasionally.  Last time she used it was yesterday.  She does have good response to this.  She denies any significant cough, wheezing, chest congestion, lower extremity swelling.  No significant pulmonary history.  No significant occupational or environmental exposures. PFTs ordered for further evaluation.    02/15/2022: OV with Laiza Veenstra NP for follow up after being started on CPAP and having PFTs. She had normal pulmonary function testing completed today. Her breathing has improved since she was here last. She's been working on weight loss measures as well and is down 13 pounds. She rarely uses albuterol. No significant cough or chest congestion. She wears CPAP nightly. She's receiving good benefit from use. Feels like she's waking more rested and she is no longer waking throughout the night. Denies morning headaches or drowsy driving. Snoring has corrected with CPAP use. Settled down with full face mask. She still has some trouble with sleep onset. [facility] prescribed her lexapro as she was having some symptoms of depression. She has only been on this a few weeks but feels like she's noticed some benefit. She will also use xanax as needed for sleep onset. Would like to hold off on further pharmacological therapy at this time as she is trying to limit the medications she is on. She has used trazodone in the past without any relief  08/21/2022: Today - follow up Patient presents today via video visit for follow up. Since she was here last, she had knee surgery in May. She went to ED a few days after her surgery due to AMS. She had been taking gabapentin, oxycodone, lexapro and xanax at home post-operatively. She has since all of these but the gabapentin. Her last dose of oxycodone was over a week ago. She has not had any further issues since. Feels like she is recovering well. She's still having a lot of trouble with her sleep, which has been an  ongoing problem for her since our first visit. She was doing better with the lexapro and occasional xanax but she prefers not to take these now. She doesn't feel like her mood is any different; no depression or anxiety coming off of these. She just felt like they helped her sleep better. She denies any sleep parasomnias/paralysis. No aberrant behavior.  She has been having trouble with her CPAP mask for the last month. She had a full face mask, which was working well, but then she got a new one and it kept leaking. They gave her a soft foam one, which seemed to do worse after she would clean it. She ordered another F20 mask, which she is going to try tonight. She denies any drowsy driving. She did feel better when she was on her CPAP.   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   Estradiol Rash and Other (See Comments)    Local rash with estradiol patches   Immunization History  Administered Date(s) Administered   Influenza Split 11/22/2014   Influenza,inj,Quad PF,6+ Mos 11/12/2013, 12/16/2015, 11/21/2021   Influenza,inj,quad, With Preservative 10/27/2016   Influenza-Unspecified 11/29/2020   PFIZER(Purple Top)SARS-COV-2 Vaccination 03/19/2019, 04/08/2019, 01/08/2020   Tdap 01/10/2012, 07/12/2021   Zoster Recombinant(Shingrix) 02/01/2020, 04/07/2020   Past Medical History:  Diagnosis Date   Anxiety    Back pain    Bursitis    right shoulder   Chronic kidney disease    hematuria, has been worked up, her normal   Chronic leukopenia    intermittant since 2010, followed by Dr. Lenord Fellers now   Chronic neck pain    Constipation    Degenerative joint disease of low back 09/02/2015   Dr. Despina Hick   Displacement of lumbar intervertebral disc    Dysrhythmia    Tachycardia, PVCs, PACs   Edema of both lower legs    Fibrocystic breast    Gallbladder problem    GERD (gastroesophageal reflux disease)    Headache    per  pt more stress/tension   Heart palpitations    SEE EPIC ENCOUTNER , CARDIOLOGY DR. Gloris [place] TURNER 2018; reports on 05-16-17 " i haven't had any bouts of those lately"     Hemorrhoids    Hidradenitis suppurativa    History of Clostridium difficile infection 12/2010   History of exercise intolerance    ETT on 04-27-2016-- negative ([facility] treadmill score 7)   History of Helicobacter pylori infection 2001 and 09/ 2012   HSV-2 infection    genital   Hx of adenomatous colonic polyps 10/17/2007   Hydradenitis    per pt currently treated with Humira injection   Hypertension    Hyperthyroidism    Hypocupremia    Hypothyroidism    Hypothyroidism, postsurgical 1980   IBS (irritable bowel syndrome)    Insomnia    Iron deficiency    Joint pain    Leukopenia    Lumbosacral radiculopathy    Lymphocytic colitis 05/14/2022   dx from colonoscopy   Medial meniscus tear    right knee   Mild obstructive sleep apnea    Mitral regurgitation    Mild to moderate by echo 09/2021   Numbness and tingling of right arm    Obesity    Osteoarthritis    Palpitations    Pernicious anemia    b12 def   Peroneal neuropathy    PONV (postoperative nausea and vomiting)    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   Prediabetes    Reactive hypoglycemia followed by pcp   post gastrectomy dumping syndrome   SOB (shortness of breath)    Spinal stenosis    Tendonitis    right shoulder   Varicose veins    Vertigo    Vitamin B 12 deficiency    Vitamin D deficiency     Tobacco History: Social History   Tobacco Use  Smoking Status Never  Smokeless Tobacco Never   Counseling given: Not Answered   Outpatient Medications Prior to Visit  Medication Sig Dispense Refill   adalimumab (HUMIRA, 2 PEN,) 40 MG/0.8ML PNKT pen Inject 1 pen subcutaneously every week (Patient taking differently: once a week.) 4 each 5   albuterol (VENTOLIN HFA) 108 (90 Base) MCG/ACT inhaler Inhale 2 puffs into the lungs every 4 hours  as needed for wheezing or shortness of breath.  18 g 3   apixaban (ELIQUIS) 2.5 MG TABS tablet Take 1 tablet (2.5 mg total) by mouth every 12 (twelve) hours. 30 tablet 0   aspirin EC 81 MG tablet Take 81 mg by mouth 2 (two) times daily. Swallow whole.     Carbinoxamine Maleate 4 MG TABS Take 1-2 tablets at night as needed for drainage and allergies. May cause drowsiness. 28 tablet 5   celecoxib (CELEBREX) 200 MG capsule Take 1 capsule (200 mg total) by mouth 2 (two) times daily as needed. 60 capsule 3   clobetasol ointment (TEMOVATE) 0.05 % Apply to skin twice daily as directed for up to 2 weeks as needed 60 g 0   cyanocobalamin (DODEX) 1000 MCG/ML injection INJECT 1 ML INTRAMUSCULARLY EVERY MONTH 3 mL 11   ergocalciferol (VITAMIN D2) 1.25 MG (50000 UT) capsule Take 1 capsule (50,000 Units total) by mouth 2 (two) times a week for Vitamin deficiency and gastric bypass. 8 capsule 1   escitalopram (LEXAPRO) 10 MG tablet Take 1 tablet (10 mg total) by mouth daily. 90 tablet 1   Estradiol (DIVIGEL) 1 MG/GM GEL Place 1 Package onto the skin daily. 90 g 1   flecainide (TAMBOCOR) 50 MG tablet Take 1 tablet (50 mg total) by mouth daily at 6 (six) AM. 90 tablet 3   gabapentin (NEURONTIN) 600 MG tablet Take 1 tablet (600 mg total) by mouth 3 (three) times daily. (Patient taking differently: Take 600 mg by mouth at bedtime. Additional 600 mg if needed for pain) 90 tablet 5   Galcanezumab-gnlm (EMGALITY) 120 MG/ML SOAJ Inject 120 mg into the skin every 30 (thirty) days. 1.12 mL 11   ipratropium (ATROVENT) 0.03 % nasal spray Use 1-2 sprays in each nostril up to 3 times daily as needed for runny nose. Can take 15 minutes prior to meals. 30 mL 3   levocetirizine (XYZAL) 5 MG tablet Take 1 tablet (5 mg total) by mouth every evening. 30 tablet 5   levothyroxine (SYNTHROID) 112 MCG tablet Take 1 tablet (112 mcg total) by mouth daily. 90 tablet 1   magic mouthwash (nystatin, hydrocortisone, diphenhydrAMINE) suspension  Swish and spit 30 mLs 4 (four) times daily. 480 mL 0   metoprolol succinate (TOPROL-XL) 25 MG 24 hr tablet Take 1 tablet (25 mg total) by mouth daily. 90 tablet 3   montelukast (SINGULAIR) 10 MG tablet Take 1 tablet (10 mg total) by mouth at bedtime. 30 tablet 3   nystatin (MYCOSTATIN) 100000 UNIT/ML suspension Take by mouth.     Olopatadine HCl 0.2 % SOLN Apply 1 drop to eye daily as needed. (Patient taking differently: Apply 1 drop to eye daily as needed (allergies).) 2.5 mL 5   ondansetron (ZOFRAN-ODT) 4 MG disintegrating tablet Take 1 tablet (4 mg total) by mouth every 8 (eight) hours as needed for nausea or vomiting. 20 tablet 0   pantoprazole (PROTONIX) 40 MG tablet Take 1 tablet by mouth daily 20 minutes before breakfast (Patient taking differently: Take 40 mg by mouth daily as needed (Heartburn).) 90 tablet 3   sulfamethoxazole-trimethoprim (BACTRIM DS) 800-160 MG tablet Take 1 tablet by mouth 2 (two) times daily as needed for flare 60 tablet 1   terconazole (TERAZOL 7) 0.4 % vaginal cream Place 1 applicatorful vaginally at bedtime for seven nights.   Then use one applicatorful once a week for 6 months. 45 g 4   tiZANidine (ZANAFLEX) 4 MG capsule Take 1 capsule (4 mg total) by mouth 3 (three) times daily as  needed for muscle spasms. 90 capsule 1   triamcinolone cream (KENALOG) 0.1 % Apply 1 Application topically 2 (two) times daily. Use for 2 weeks, and then off for 2 weeks as needed. 80 g 1   Ubrogepant (UBRELVY) 100 MG TABS Take 1 tablet (100 mg total) by mouth every 2 (two) hours as needed. Maximum 200mg  a day. 16 tablet 11   ALPRAZolam (XANAX) 0.5 MG tablet Take 1 tablet (0.5 mg total) by mouth in the morning AND 1 tablet (0.5 mg total) daily at 12 noon AND 2 tablets (1 mg total) at bedtime. 120 tablet 2   oxyCODONE (ROXICODONE) 5 MG immediate release tablet Take 1-2 tablets (5-10 mg total) by mouth every 6 (six) hours as needed for severe pain. 30 tablet 0   promethazine (PHENERGAN) 25 MG  tablet Take 1 tablet (25 mg total) by mouth every 8 (eight) hours as needed for N/V. 30 tablet 5   Facility-Administered Medications Prior to Visit  Medication Dose Route Frequency Provider Last Rate Last Admin   gadobenate dimeglumine (MULTIHANCE) injection 20 mL  20 mL Intravenous Once PRN Anson Fret, MD         Review of Systems:   Constitutional: No weight gain or loss, night sweats, fevers, chills, or lassitude. + fatigue HEENT: No headaches, difficulty swallowing, tooth/dental problems, or sore throat. No sneezing, itching, ear ache. +nasal congestion (chronic) CV:  No chest pain, orthopnea, PND, swelling in lower extremities, anasarca, palpitations, syncope Resp: +shortness of breath with exertion (improved). No excess mucus or change in color of mucus.  No cough.  Not no hemoptysis. No wheezing.  No chest wall deformity GI:  No heartburn, indigestion, abdominal pain, nausea, vomiting, diarrhea, change in bowel habits, loss of appetite, bloody stools.  GU: No dysuria, change in color of urine, urgency or frequency, nocturia.  Skin: No rash, lesions, ulcerations Neuro: No dizziness. No gait abnormalities. No memory impairment.  Psych: No depression or anxiety. Mood stable. +sleep disturbance   Observations/Objective: Patient is well-developed, well-nourished in no acute distress. A&Ox3. Resting comfortably at home. Unlabored breathing. Speech is clear and coherent with logical content.   Assessment and Plan: Obstructive sleep apnea Supoptimal compliance. Difficulties tolerating mask. We will send her for mask desensitization. She is aware of risks of untreated OSA. She would like to continue to try CPAP. She did feel like it was working well for her previously and was receiving benefit from use. Aware of safe driving practices.  Patient Instructions  Increase use of CPAP every night, minimum of 4-6 hours a night.  Change equipment every 30 days or as directed by DME. Wash your  tubing with warm soap and water daily, hang to dry. Wash humidifier portion weekly. Use bottled, distilled water and change daily Be aware of reduced alertness and do not drive or operate heavy machinery if experiencing this or drowsiness.  Notify if persistent daytime sleepiness occurs even with consistent use of CPAP.  Attend mask fitting   Start ambien 5 mg At bedtime as needed for sleep. Do not drive or operate any heavy machinery after taking. Be aware of changes in sleep habits. Use caution when taking with other medications, such as your gabapentin, as this can cause increased sedative effects. Do not take without using your CPAP as this can make sleep apnea worse  Follow up in 6 weeks with Dr. Wynona Neat or Florentina Addison Raylyn Speckman,NP to see how new sleep medicine is working. If symptoms do not improve or worsen, please  contact office for sooner follow up or seek emergency care.    Insomnia Longstanding history of insomnia. Improved briefly with previous use of xanax and lexapro. She has since discontinued both of these. She is having more difficulties with sleep latency and maintenance, which is likely multifactorial. Sleep hygiene reviewed. She has tried [place] in the past. Posey Rea if it worked for her or not. She is open to retrial. Medication education and side effect profile reviewed. She understands she needs to wear her CPAP when taking this.     I discussed the assessment and treatment plan with the patient. The patient was provided an opportunity to ask questions and all were answered. The patient agreed with the plan and demonstrated an understanding of the instructions.   The patient was advised to call back or seek an in-person evaluation if the symptoms worsen or if the condition fails to improve as anticipated.  I provided 32 minutes of non-face-to-face time during this encounter.   Noemi Chapel, NP

## 2022-08-21 NOTE — Telephone Encounter (Signed)
Per 08/17/22 LOS - called patient and lvm of upcoming appointments - requested callback to confirm.

## 2022-08-22 ENCOUNTER — Ambulatory Visit (INDEPENDENT_AMBULATORY_CARE_PROVIDER_SITE_OTHER): Payer: 59 | Admitting: Physical Therapy

## 2022-08-22 ENCOUNTER — Encounter: Payer: Self-pay | Admitting: Physical Therapy

## 2022-08-22 DIAGNOSIS — M25661 Stiffness of right knee, not elsewhere classified: Secondary | ICD-10-CM | POA: Diagnosis not present

## 2022-08-22 DIAGNOSIS — R600 Localized edema: Secondary | ICD-10-CM | POA: Diagnosis not present

## 2022-08-22 DIAGNOSIS — R2689 Other abnormalities of gait and mobility: Secondary | ICD-10-CM | POA: Diagnosis not present

## 2022-08-22 DIAGNOSIS — M6281 Muscle weakness (generalized): Secondary | ICD-10-CM

## 2022-08-22 DIAGNOSIS — M25561 Pain in right knee: Secondary | ICD-10-CM

## 2022-08-22 DIAGNOSIS — G4700 Insomnia, unspecified: Secondary | ICD-10-CM

## 2022-08-22 NOTE — Therapy (Signed)
OUTPATIENT PHYSICAL THERAPY TREATMENT NOTE    Patient Name: Victoria [name] MRN: [id number] DOB:06/23/1960, 62 y.o., female Today's Date: 08/22/2022  END OF SESSION:  PT End of Session - 08/22/22 1017     Visit Number 8    Number of Visits 16    Date for PT Re-Evaluation 09/25/22    Authorization Type [facility] [employer] $40 copay    PT Start Time 1012    PT Stop Time 1040   limited due to pain and fatigue   PT Time Calculation (min) 28 min    Activity Tolerance Patient limited by fatigue;Patient limited by pain    Behavior During Therapy [facility] for tasks assessed/performed                   Past Medical History:  Diagnosis Date   Anxiety    Back pain    Bursitis    right shoulder   Chronic kidney disease    hematuria, has been worked up, her normal   Chronic leukopenia    intermittant since 2010, followed by Dr. Lenord Fellers now   Chronic neck pain    Constipation    Degenerative joint disease of low back 09/02/2015   Dr. Despina Hick   Displacement of lumbar intervertebral disc    Dysrhythmia    Tachycardia, PVCs, PACs   Edema of both lower legs    Fibrocystic breast    Gallbladder problem    GERD (gastroesophageal reflux disease)    Headache    per pt more stress/tension   Heart palpitations    SEE EPIC ENCOUTNER , CARDIOLOGY DR. Gloris Manchester TURNER 2018; reports on 05-16-17 " i haven't had any bouts of those lately"     Hemorrhoids    Hidradenitis suppurativa    History of Clostridium difficile infection 12/2010   History of exercise intolerance    ETT on 04-27-2016-- negative ([facility] treadmill score 7)   History of Helicobacter pylori infection 2001 and 09/ 2012   HSV-2 infection    genital   Hx of adenomatous colonic polyps 10/17/2007   Hydradenitis    per pt currently treated with Humira injection   Hypertension    Hyperthyroidism    Hypocupremia    Hypothyroidism    Hypothyroidism, postsurgical 1980   IBS (irritable bowel syndrome)    Insomnia    Iron  deficiency    Joint pain    Leukopenia    Lumbosacral radiculopathy    Lymphocytic colitis 05/14/2022   dx from colonoscopy   Medial meniscus tear    right knee   Mild obstructive sleep apnea    Mitral regurgitation    Mild to moderate by echo 09/2021   Numbness and tingling of right arm    Obesity    Osteoarthritis    Palpitations    Pernicious anemia    b12 def   Peroneal neuropathy    PONV (postoperative nausea and vomiting)    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   Prediabetes    Reactive hypoglycemia followed by pcp   post gastrectomy dumping syndrome   SOB (shortness of breath)    Spinal stenosis    Tendonitis    right shoulder   Varicose veins    Vertigo    Vitamin B 12 deficiency    Vitamin D deficiency    Past Surgical History:  Procedure Laterality Date   BREATH TEK H PYLORI N/A 05/03/2014   Procedure: BREATH TEK H PYLORI;  Surgeon: Wenda Low, MD;  Location: [facility];  Service: General;  Laterality: N/A;   CARDIOVASCULAR STRESS TEST  03/11/2013   dr Gloris Manchester turner   Low risk nuclear study w/ a very small anterior perfusion defect that most likely represents breast attenuation artifact, less likely this could represent a true perfusion abnormality in the distribution of diagonal branch of the LAD/  normal LV function and wall motion , ef 66%   CATARACT EXTRACTION W/ INTRAOCULAR LENS  IMPLANT, BILATERAL  10/2017   CESAREAN SECTION  1985   CHOLECYSTECTOMY OPEN  1987   COLONOSCOPY  02/2017   Dr Loreta Ave ; per patient they found 7 polyps with polypectomy; on 5 year track    ESOPHAGOGASTRODUODENOSCOPY  02/01/2021   FINE NEEDLE ASPIRATION Left 05/22/2017   Procedure: LEFT KNEE ASPIRATION;  Surgeon: Ollen Gross, MD;  Location: [facility];  Service: Orthopedics;  Laterality: Left;   GASTRIC ROUX-EN-Y N/A 07/06/2014   Procedure: LAPAROSCOPIC ROUX-EN-Y GASTRIC BYPASS, ENTEROLYSIS OF ADHESIONS WITH UPPER ENDOSCOPY;  Surgeon: Luretha Murphy, MD;  Location: [facility];   Service: General;  Laterality: N/A;   HAMMER TOE SURGERY Bilateral 02/17/2008   bilateral foot digit's 2 and 5   HIATAL HERNIA REPAIR N/A 07/06/2014   Procedure: LAPAROSCOPIC REPAIR OF HIATAL HERNIA;  Surgeon: Luretha Murphy, MD;  Location: [facility];  Service: General;  Laterality: N/A;   I & D KNEE WITH POLY EXCHANGE Left 12/11/2019   Procedure: LEFT KNEE POLY-LINER EXCHANGE;  Surgeon: Kathryne Hitch, MD;  Location: [facility];  Service: Orthopedics;  Laterality: Left;   KNEE ARTHROSCOPY Right 12/11/2019   Procedure: RIGHT KNEE ARTHROSCOPY WITH PARTIAL MEDIAL MENISCECTOMY;  Surgeon: Kathryne Hitch, MD;  Location: [facility];  Service: Orthopedics;  Laterality: Right;   KNEE ARTHROSCOPY WITH MEDIAL MENISECTOMY Left 07/17/2017   Procedure: LEFT KNEE ARTHROSCOPY WITH MEDIAL LATERAL MENISECTOMY;  Surgeon: Ollen Gross, MD;  [facility]: [facility];  Service: Orthopedics;  Laterality: Left;   MASS EXCISION N/A 10/11/2016   Procedure: EXCISION OF PERIANAL MASS;  Surgeon: Romie Levee, MD;  Location: [facility];  Service: General;  Laterality: N/A;   PARTIAL HYSTERECTOMY  2006   has ovaries   REFRACTIVE SURGERY     THYROIDECTOMY  1980   TOTAL ABDOMINAL HYSTERECTOMY  08-25-2002   dr Myrlene Broker   ovaries retained   TOTAL HIP ARTHROPLASTY Right 05/21/2012   Procedure: TOTAL HIP ARTHROPLASTY;  Surgeon: Loanne Drilling, MD;  Location: [facility];  Service: Orthopedics;  Laterality: Right;   TOTAL HIP ARTHROPLASTY Left 01-31-2009  dr Lequita Halt   TOTAL HIP REVISION Left 05/22/2017   Procedure: Left hip bearing surface and head revision;  Surgeon: Ollen Gross, MD;  [facility]: [facility];  Service: Orthopedics;  Laterality: Left;   TOTAL KNEE ARTHROPLASTY Left 01/20/2018   Procedure: LEFT TOTAL KNEE ARTHROPLASTY;  Surgeon: Ollen Gross, MD;  [facility]: [facility];  Service: Orthopedics;  Laterality: Left;    TOTAL KNEE ARTHROPLASTY Right 07/13/2022   Procedure: RIGHTTOTAL KNEE ARTHROPLASTY;   Surgeon: Kathryne Hitch, MD;  Location: [facility];  Service: Orthopedics;  Laterality: Right;   TRANSTHORACIC ECHOCARDIOGRAM  05/03/2016   ef 55-60%/  trivial MR/  mild TR   TUBAL LIGATION     Patient Active Problem List   Diagnosis Date Noted   Polypharmacy 08/13/2022   Status post total right knee replacement 07/13/2022   Weight gain 07/12/2022   Lymphocytic colitis 07/12/2022   Migraine without aura and without status migrainosus, not intractable 07/02/2022   Chronic conjunctivitis  of both eyes 06/28/2022   Generalized abdominal pain 05/24/2022   Chronic rhinitis 05/09/2022   Chronic migraine without aura without status migrainosus, not intractable 12/26/2021   Depressed mood 12/19/2021   Obstructive sleep apnea 12/19/2021   Other hypoglycemia-post bariatric 12/05/2021   Secondary hyperparathyroidism (HCC) 12/05/2021   Inadequate fluid intake 12/05/2021   Inadequate sleep hygiene 12/05/2021   Class 3 severe obesity with body mass index (BMI) of 40.0 to 44.9 in adult [facility]) 12/05/2021   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   SOBOE (shortness of breath on exertion) 11/21/2021   Other fatigue 11/21/2021   Vitamin D deficiency 11/21/2021   Iron deficiency 11/21/2021   Hypoglycemia after bariatric surgery 11/21/2021   Depression screening 11/21/2021   Class 3 severe obesity with serious comorbidity and body mass index (BMI) of 40.0 to 44.9 in adult (HCC) 11/21/2021   Mild obstructive sleep apnea 11/17/2021   Vertigo 11/17/2021   Excessive daytime sleepiness 10/23/2021   Spinal stenosis of lumbar region 07/07/2020   Displacement of lumbar intervertebral disc 06/02/2020   Lumbar spondylosis 06/02/2020   Lumbar stenosis with neurogenic claudication 06/02/2020   Arm numbness 05/30/2020   Chronic neck pain 05/30/2020   Other chronic pain 05/30/2020   Elevated blood-pressure reading, without diagnosis of hypertension 05/30/2020   Status post arthroscopy of right knee  12/11/2019   Polyethylene wear of left knee joint prosthesis (HCC) 09/09/2019   Acute medial meniscal tear, right, subsequent encounter 08/27/2019   History of total left knee replacement 07/16/2019   Peroneal neuropathy at knee, left 06/25/2019   Lumbosacral radiculopathy 06/07/2019   Iron malabsorption 10/16/2018   IDA (iron deficiency anemia) 10/16/2018   OA (osteoarthritis) of knee 01/20/2018   Acute meniscal tear, medial 07/17/2017   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   Failed total hip arthroplasty, 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/2017   Pain in left knee 04/11/2017   Insomnia 11/08/2016   Pernicious anemia 11/08/2016   Leukopenia 07/09/2016   Hypocupremia 07/09/2016   Palpitations 04/12/2016   Shortness of breath 04/12/2016   Reactive hypoglycemia 10/31/2015   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/31/2015   Lap gastric bypass May 2016 07/06/2014   History of Clostridium difficile infection 08/12/2011   Post-surgical hypothyroidism 08/26/2010   Gastroesophageal reflux disease 08/26/2010   Vitamin D deficiency 08/26/2010   Fibrocystic disease of breast 08/26/2010   Cobalamin deficiency 08/26/2010   Morbid obesity (HCC) 03/20/2010   Backache 03/20/2010    PCP: Margaree Mackintosh, MD  REFERRING PROVIDER: Kathryne Hitch, MD  REFERRING DIAG: [phone] (ICD-10-CM) - Status post total right knee replacement  Rationale for Evaluation and Treatment: Rehabilitation  THERAPY DIAG:  Acute pain of right knee  Stiffness of right knee, not elsewhere classified  Muscle weakness (generalized)  Other abnormalities of gait and mobility  Localized edema  ONSET DATE: 07/13/22 (DOS)   SUBJECTIVE:  SUBJECTIVE STATEMENT: Having more nerve pain the past few days,  tried a sleeping pill but was unable to  sleep  PERTINENT HISTORY:  PMH includes: obesity, gastric bypass, L THA revision, L TKA, OSA, spinal stenosis  PAIN:  Are you having pain? Yes: NPRS scale: 5 currently, /10 Pain location: Rt knee Pain description: throbbing, aching, occasional shocks Aggravating factors: end ranges; increased activity Relieving factors: repositioning, icing  PRECAUTIONS:  None  WEIGHT BEARING RESTRICTIONS:  No  FALLS:  Has patient fallen in last 6 months? No  LIVING ENVIRONMENT: Lives with: lives with their spouse Lives in: House/apartment Stairs: Yes: Internal: flight steps; has stair lift and External: 5 steps; can reach both (2 steps in garage; no railing) Has following equipment at home: Retail banker - 2 wheeled  OCCUPATION:  Charity fundraiser at [employer]  PLOF:  Independent and Leisure: walking in the park  PATIENT GOALS:  Regain use of RLE, improve motion in knee   OBJECTIVE: (objective measures completed at initial evaluation unless otherwise dated)   PATIENT SURVEYS:  07/31/22: FOTO 34 (predicted 51)  COGNITIVE STATUS: Within functional limits for tasks assessed   RED FLAGS: None    SENSATION: 07/31/22: Not tested  POSTURE:  rounded shoulders and forward head   GAIT: 07/31/22 Distance walked: 100' in clinic Assistive device utilized: Environmental consultant - 2 wheeled Level of assistance: Modified independence Comments: antalgic gait with decreased stance on Rt , decreased hip/knee flexion   Knee  LOWER EXTREMITY ROM:     ROM Right eval Left eval Right LE ROM 08/03/22 Right 08/06/22 Right 08/20/22  Knee flexion A: 65 P:71 A: 100 Seated edge of mat, post manual  A: 79 deg P: 82 deg  A: 77 P: 86 A: 99 P: 100  Knee extension A: -2 (seated LAQ) P: 0 A: 4 (hyper) 0 deg A/PROM with mild pain at end range A: 0  (seated LAQ) A: 0  (seated LAQ)   (Blank rows = not tested)   LOWER EXTREMITY MMT:    MMT Right eval Left eval  Knee flexion 3-/5   Knee extension 3-/5     (Blank rows = not tested)   TREATMENT:                                                                                                                              DATE:  08/22/22 TherEx SciFit bike seat 15; L3 x 8 min Seated LAQ 4# 3x10; 3 sec hold at end ranges on Rt Seated SLR 2x10; working on maintaining knee extension Seated hamstring stretch 3x30 sec on Rt  08/20/22 TherEx SciFit bike seat 15; able to progress to full revolutions x 8 min Leg press DL 16# 1W96, then Rt leg only 31# 3X10 Forward step up onto 6" step 2x10 leading with RLE Lateral step ups onto 6" step 2x10 leading with RLE Seated LAQ 4# 3x10; 3 sec hold at end ranges on  Rt AA heel slides x 10 reps ROM measurements - see above for details Standing hip extension and abduction 2x10 each; L3 band   08/16/22 TherEx SciFit bike seat 15; able to progress to full revolutions x 8 min Seated LAQ 3# 3x10; 3 sec hold at end ranges on Rt Sit to/from stand x 10 reps; from elevated  Seated SLR 2X10 on Rt Seated knee flexion AAROM stretch 5 sec  x 10 reps Leg press DL 16# 1W96, then Rt leg only 25# X10, stretching into max flexion 2-3 sec each rep.   Gait Training Amb with SPC with supervision throughout clinic durin session - safe with household and limited community ambulation  Manual Seated Rt knee flexion PROM to tolerance x 8 min  08/14/22 TherEx SciFit bike seat 15; partial revolutions x 8 min Seated LAQ 3# 3x10; 3 sec hold at end ranges on Rt Sit to/from stand x 10 reps; from elevated  Heel slides x 10 reps with strap  Gait Training Amb with SPC with supervision and min cues for sequencing - safe with household and limited community ambulation  Manual Seated Rt knee flexion PROM to tolerance x 8 min  Modalities Vaso x 10 min to Rt knee, mod pressure 34 deg    PATIENT EDUCATION:  Education details: rationale for interventions, HEP  Person educated: Patient Education method: Programmer, multimedia,  Demonstration, and Handouts Education comprehension: verbalized understanding, returned demonstration, and needs further education  HOME EXERCISE PROGRAM: Access Code: [email or web address] URL: [email or web address] Date: 08/20/2022 Prepared by: Moshe Cipro  Exercises - Seated Knee Flexion AAROM  - 5-10 x daily - 7 x weekly - 1 sets - 5-10 reps - 5 sec hold - Supine Heel Slide with Strap  - 5-10 x daily - 7 x weekly - 1 sets - 5-10 reps - Quad Set  - 5-10 x daily - 7 x weekly - 1 sets - 5-10 reps - 5 sec hold - Seated Straight Leg Raise  - 2-3 x daily - 7 x weekly - 1-2 sets - 10 reps - Sit to Stand  - 2 x daily - 7 x weekly - 1 sets - 10 reps - Standing Hip Abduction with Resistance at Ankles and Counter Support  - 1 x daily - 7 x weekly - 2 sets - 10 reps - Standing Hip Extension with Resistance at Ankles and Counter Support  - 1 x daily - 7 x weekly - 2 sets - 10 reps   ASSESSMENT:  CLINICAL IMPRESSION: Session limited today by pain and fatigue with pt requesting to end session early.  Overall knee is progressing well, but pt struggling with difficulty sleeping, elevated pain and decreased appetite which is limiting her progress.  She will continue to benefit from PT to maximize function.    Per eval - Patient is a 62 y.o. female who was seen today for physical therapy evaluation and treatment for Rt TKA on 07/13/22. She demonstrates decreased strength ROM and balance, as well as gait abnormalities and expected post op pain and swelling affecting functional mobility.  She will benefit from PT to address deficits listed.    OBJECTIVE IMPAIRMENTS: Abnormal gait, decreased activity tolerance, decreased balance, decreased knowledge of use of DME, decreased mobility, difficulty walking, decreased ROM, decreased strength, hypomobility, increased fascial restrictions, increased muscle spasms, and pain.   ACTIVITY LIMITATIONS: carrying, lifting, bending, sitting, standing, squatting,  sleeping, stairs, transfers, and locomotion level  PARTICIPATION LIMITATIONS: meal prep, cleaning, laundry, driving, shopping, community activity, occupation, and  yard work  PERSONAL FACTORS: 3+ comorbidities: obesity, gastric bypass, L THA revision, L TKA, OSA, spinal stenosis  are also affecting patient's functional outcome.   REHAB POTENTIAL: Good  CLINICAL DECISION MAKING: Evolving/moderate complexity  EVALUATION COMPLEXITY: Moderate   GOALS: Goals reviewed with patient? Yes  SHORT TERM GOALS: Target date: 08/28/2022  Independent with initial HEP Goal status: MET 08/20/22  2.  Rt knee AROM improved 0-90 deg for improved function Goal status: MET 08/20/22   LONG TERM GOALS: Target date: 09/25/2022  Independent with final HEP Goal status: INITIAL  2.  FOTO score improved to 51 Goal status: INITIAL  3.  Rt knee AROM improved to 0-100 for improved function and mobility Goal status: INIITAL  4.  Report pain < 3/10 with amb and standing activities for improved function Goal status: INITIAL  5.  Amb with LRAD modified independently for improved function and mobility Goal status: INITIAL    PLAN:  PT FREQUENCY: 2x/week  PT DURATION: 8 weeks  PLANNED INTERVENTIONS: Therapeutic exercises, Therapeutic activity, Neuromuscular re-education, Balance training, Gait training, Patient/Family education, Self Care, Joint mobilization, Stair training, DME instructions, Aquatic Therapy, Dry Needling, Electrical stimulation, Cryotherapy, Moist heat, Taping, Vasopneumatic device, Manual therapy, and Re-evaluation.  PLAN FOR NEXT SESSION: monitor pain levels, focus on flexion as pt can tolerate, closed chain strengthening and balance as pt can tolerate   NEXT MD VISIT: 09/05/22    Clarita Crane, PT, DPT 08/22/22 10:54 AM

## 2022-08-24 ENCOUNTER — Other Ambulatory Visit (HOSPITAL_COMMUNITY): Payer: Self-pay

## 2022-08-24 MED ORDER — ESZOPICLONE 1 MG PO TABS
1.0000 mg | ORAL_TABLET | Freq: Every evening | ORAL | 0 refills | Status: DC | PRN
Start: 2022-08-24 — End: 2022-09-20
  Filled 2022-08-24: qty 10, 10d supply, fill #0

## 2022-08-24 NOTE — Telephone Encounter (Signed)
I am going to send in [place]. She should discontinue the [place]. She will start with 1 mg At bedtime and can increase to 3 mg as needed on subsequent nights for sleep. Do not drive after taking. Monitor for any mood changes or odd sleep habits. If no improvement with this, please let us know. Thanks.

## 2022-08-27 ENCOUNTER — Other Ambulatory Visit: Payer: 59

## 2022-08-27 ENCOUNTER — Other Ambulatory Visit: Payer: Self-pay

## 2022-08-27 DIAGNOSIS — G4733 Obstructive sleep apnea (adult) (pediatric): Secondary | ICD-10-CM | POA: Diagnosis not present

## 2022-08-27 NOTE — Telephone Encounter (Signed)
It can increase sedative effects but is not a contraindication to use. She should use caution, especially with increased dosing, and monitor her response. Thanks.

## 2022-08-28 ENCOUNTER — Ambulatory Visit (INDEPENDENT_AMBULATORY_CARE_PROVIDER_SITE_OTHER): Payer: 59 | Admitting: Physical Therapy

## 2022-08-28 ENCOUNTER — Encounter: Payer: Self-pay | Admitting: Physical Therapy

## 2022-08-28 DIAGNOSIS — M25561 Pain in right knee: Secondary | ICD-10-CM

## 2022-08-28 DIAGNOSIS — M6281 Muscle weakness (generalized): Secondary | ICD-10-CM | POA: Diagnosis not present

## 2022-08-28 DIAGNOSIS — R2689 Other abnormalities of gait and mobility: Secondary | ICD-10-CM | POA: Diagnosis not present

## 2022-08-28 DIAGNOSIS — R600 Localized edema: Secondary | ICD-10-CM

## 2022-08-28 DIAGNOSIS — M25661 Stiffness of right knee, not elsewhere classified: Secondary | ICD-10-CM | POA: Diagnosis not present

## 2022-08-28 NOTE — Therapy (Signed)
OUTPATIENT PHYSICAL THERAPY TREATMENT NOTE    Patient Name: Victoria [name] MRN: [id number] DOB:02-13-1961, 62 y.o., female Today's Date: 08/28/2022  END OF SESSION:  PT End of Session - 08/28/22 1011     Visit Number 9    Number of Visits 16    Date for PT Re-Evaluation 09/25/22    Authorization Type [facility] [employer] $40 copay    PT Start Time 1012    PT Stop Time 1050    PT Time Calculation (min) 38 min    Activity Tolerance Patient limited by fatigue;Patient limited by pain    Behavior During Therapy [facility] for tasks assessed/performed                    Past Medical History:  Diagnosis Date   Anxiety    Back pain    Bursitis    right shoulder   Chronic kidney disease    hematuria, has been worked up, her normal   Chronic leukopenia    intermittant since 2010, followed by Dr. Lenord Fellers now   Chronic neck pain    Constipation    Degenerative joint disease of low back 09/02/2015   Dr. Despina Hick   Displacement of lumbar intervertebral disc    Dysrhythmia    Tachycardia, PVCs, PACs   Edema of both lower legs    Fibrocystic breast    Gallbladder problem    GERD (gastroesophageal reflux disease)    Headache    per pt more stress/tension   Heart palpitations    SEE EPIC ENCOUTNER , CARDIOLOGY DR. Gloris Manchester TURNER 2018; reports on 05-16-17 " i haven't had any bouts of those lately"     Hemorrhoids    Hidradenitis suppurativa    History of Clostridium difficile infection 12/2010   History of exercise intolerance    ETT on 04-27-2016-- negative ([facility] treadmill score 7)   History of Helicobacter pylori infection 2001 and 09/ 2012   HSV-2 infection    genital   Hx of adenomatous colonic polyps 10/17/2007   Hydradenitis    per pt currently treated with Humira injection   Hypertension    Hyperthyroidism    Hypocupremia    Hypothyroidism    Hypothyroidism, postsurgical 1980   IBS (irritable bowel syndrome)    Insomnia    Iron deficiency    Joint pain     Leukopenia    Lumbosacral radiculopathy    Lymphocytic colitis 05/14/2022   dx from colonoscopy   Medial meniscus tear    right knee   Mild obstructive sleep apnea    Mitral regurgitation    Mild to moderate by echo 09/2021   Numbness and tingling of right arm    Obesity    Osteoarthritis    Palpitations    Pernicious anemia    b12 def   Peroneal neuropathy    PONV (postoperative nausea and vomiting)    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   Prediabetes    Reactive hypoglycemia followed by pcp   post gastrectomy dumping syndrome   SOB (shortness of breath)    Spinal stenosis    Tendonitis    right shoulder   Varicose veins    Vertigo    Vitamin B 12 deficiency    Vitamin D deficiency    Past Surgical History:  Procedure Laterality Date   BREATH TEK H PYLORI N/A 05/03/2014   Procedure: BREATH TEK H PYLORI;  Surgeon: Wenda Low, MD;  Location: [facility];  Service: General;  Laterality: N/A;   CARDIOVASCULAR STRESS TEST  03/11/2013   dr Gloris Manchester turner   Low risk nuclear study w/ a very small anterior perfusion defect that most likely represents breast attenuation artifact, less likely this could represent a true perfusion abnormality in the distribution of diagonal branch of the LAD/  normal LV function and wall motion , ef 66%   CATARACT EXTRACTION W/ INTRAOCULAR LENS  IMPLANT, BILATERAL  10/2017   CESAREAN SECTION  1985   CHOLECYSTECTOMY OPEN  1987   COLONOSCOPY  02/2017   Dr Loreta Ave ; per patient they found 7 polyps with polypectomy; on 5 year track    ESOPHAGOGASTRODUODENOSCOPY  02/01/2021   FINE NEEDLE ASPIRATION Left 05/22/2017   Procedure: LEFT KNEE ASPIRATION;  Surgeon: Ollen Gross, MD;  Location: [facility];  Service: Orthopedics;  Laterality: Left;   GASTRIC ROUX-EN-Y N/A 07/06/2014   Procedure: LAPAROSCOPIC ROUX-EN-Y GASTRIC BYPASS, ENTEROLYSIS OF ADHESIONS WITH UPPER ENDOSCOPY;  Surgeon: Luretha Murphy, MD;  Location: [facility];  Service: General;  Laterality:  N/A;   HAMMER TOE SURGERY Bilateral 02/17/2008   bilateral foot digit's 2 and 5   HIATAL HERNIA REPAIR N/A 07/06/2014   Procedure: LAPAROSCOPIC REPAIR OF HIATAL HERNIA;  Surgeon: Luretha Murphy, MD;  Location: [facility];  Service: General;  Laterality: N/A;   I & D KNEE WITH POLY EXCHANGE Left 12/11/2019   Procedure: LEFT KNEE POLY-LINER EXCHANGE;  Surgeon: Kathryne Hitch, MD;  Location: [facility];  Service: Orthopedics;  Laterality: Left;   KNEE ARTHROSCOPY Right 12/11/2019   Procedure: RIGHT KNEE ARTHROSCOPY WITH PARTIAL MEDIAL MENISCECTOMY;  Surgeon: Kathryne Hitch, MD;  Location: [facility];  Service: Orthopedics;  Laterality: Right;   KNEE ARTHROSCOPY WITH MEDIAL MENISECTOMY Left 07/17/2017   Procedure: LEFT KNEE ARTHROSCOPY WITH MEDIAL LATERAL MENISECTOMY;  Surgeon: Ollen Gross, MD;  Location: [facility];  Service: Orthopedics;  Laterality: Left;   MASS EXCISION N/A 10/11/2016   Procedure: EXCISION OF PERIANAL MASS;  Surgeon: Romie Levee, MD;  Location[facility];  Service: General;  Laterality: N/A;   PARTIAL HYSTERECTOMY  2006   has ovaries   REFRACTIVE SURGERY     THYROIDECTOMY  1980   TOTAL ABDOMINAL HYSTERECTOMY  08-25-2002   dr Myrlene Broker   ovaries retained   TOTAL HIP ARTHROPLASTY Right 05/21/2012   Procedure: TOTAL HIP ARTHROPLASTY;  Surgeon: Loanne Drilling, MD;  Location: [facility];  Service: Orthopedics;  Laterality: Right;   TOTAL HIP ARTHROPLASTY Left 01-31-2009  dr Lequita Halt   TOTAL HIP REVISION Left 05/22/2017   Procedure: Left hip bearing surface and head revision;  Surgeon: Ollen Gross, MD;  Location: [facility];  Service: Orthopedics;  Laterality: Left;   TOTAL KNEE ARTHROPLASTY Left 01/20/2018   Procedure: LEFT TOTAL KNEE ARTHROPLASTY;  Surgeon: Ollen Gross, MD;  Location: [facility];  Service: Orthopedics;  Laterality: Left;    TOTAL KNEE ARTHROPLASTY Right 07/13/2022   Procedure: RIGHTTOTAL KNEE ARTHROPLASTY;  Surgeon: Kathryne Hitch, MD;  Location: [facility];  Service: Orthopedics;  Laterality: Right;   TRANSTHORACIC ECHOCARDIOGRAM  05/03/2016   ef 55-60%/  trivial MR/  mild TR   TUBAL LIGATION     Patient Active Problem List   Diagnosis Date Noted   Polypharmacy 08/13/2022   Status post total right knee replacement 07/13/2022   Weight gain 07/12/2022   Lymphocytic colitis 07/12/2022   Migraine without aura and without status migrainosus, not intractable 07/02/2022   Chronic conjunctivitis of both eyes 06/28/2022   Generalized  abdominal pain 05/24/2022   Chronic rhinitis 05/09/2022   Chronic migraine without aura without status migrainosus, not intractable 12/26/2021   Depressed mood 12/19/2021   Obstructive sleep apnea 12/19/2021   Other hypoglycemia-post bariatric 12/05/2021   Secondary hyperparathyroidism (HCC) 12/05/2021   Inadequate fluid intake 12/05/2021   Inadequate sleep hygiene 12/05/2021   Class 3 severe obesity with body mass index (BMI) of 40.0 to 44.9 in adult [facility]) 12/05/2021   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   SOBOE (shortness of breath on exertion) 11/21/2021   Other fatigue 11/21/2021   Vitamin D deficiency 11/21/2021   Iron deficiency 11/21/2021   Hypoglycemia after bariatric surgery 11/21/2021   Depression screening 11/21/2021   Class 3 severe obesity with serious comorbidity and body mass index (BMI) of 40.0 to 44.9 in adult (HCC) 11/21/2021   Mild obstructive sleep apnea 11/17/2021   Vertigo 11/17/2021   Excessive daytime sleepiness 10/23/2021   Spinal stenosis of lumbar region 07/07/2020   Displacement of lumbar intervertebral disc 06/02/2020   Lumbar spondylosis 06/02/2020   Lumbar stenosis with neurogenic claudication 06/02/2020   Arm numbness 05/30/2020   Chronic neck pain 05/30/2020   Other chronic pain 05/30/2020   Elevated blood-pressure reading, without diagnosis of hypertension 05/30/2020   Status post arthroscopy of right knee 12/11/2019   Polyethylene wear  of left knee joint prosthesis (HCC) 09/09/2019   Acute medial meniscal tear, right, subsequent encounter 08/27/2019   History of total left knee replacement 07/16/2019   Peroneal neuropathy at knee, left 06/25/2019   Lumbosacral radiculopathy 06/07/2019   Iron malabsorption 10/16/2018   IDA (iron deficiency anemia) 10/16/2018   OA (osteoarthritis) of knee 01/20/2018   Acute meniscal tear, medial 07/17/2017   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   Failed total hip arthroplasty, 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/2017   Pain in left knee 04/11/2017   Insomnia 11/08/2016   Pernicious anemia 11/08/2016   Leukopenia 07/09/2016   Hypocupremia 07/09/2016   Palpitations 04/12/2016   Shortness of breath 04/12/2016   Reactive hypoglycemia 10/31/2015   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/31/2015   Lap gastric bypass May 2016 07/06/2014   History of Clostridium difficile infection 08/12/2011   Post-surgical hypothyroidism 08/26/2010   Gastroesophageal reflux disease 08/26/2010   Vitamin D deficiency 08/26/2010   Fibrocystic disease of breast 08/26/2010   Cobalamin deficiency 08/26/2010   Morbid obesity (HCC) 03/20/2010   Backache 03/20/2010    PCP: Margaree Mackintosh, MD  REFERRING PROVIDER: Kathryne Hitch, MD  REFERRING DIAG: [phone] (ICD-10-CM) - Status post total right knee replacement  Rationale for Evaluation and Treatment: Rehabilitation  THERAPY DIAG:  Acute pain of right knee  Stiffness of right knee, not elsewhere classified  Muscle weakness (generalized)  Other abnormalities of gait and mobility  Localized edema  ONSET DATE: 07/13/22 (DOS)   SUBJECTIVE:  SUBJECTIVE STATEMENT: Doing much better  PERTINENT HISTORY:  PMH includes: obesity, gastric bypass, L THA revision, L TKA, OSA, spinal  stenosis  PAIN:  Are you having pain? Yes: NPRS scale: 5 currently, /10 Pain location: Rt knee Pain description: throbbing, aching, occasional shocks Aggravating factors: end ranges; increased activity Relieving factors: repositioning, icing  PRECAUTIONS:  None  WEIGHT BEARING RESTRICTIONS:  No  FALLS:  Has patient fallen in last 6 months? No  LIVING ENVIRONMENT: Lives with: lives with their spouse Lives in: House/apartment Stairs: Yes: Internal: flight steps; has stair lift and External: 5 steps; can reach both (2 steps in garage; no railing) Has following equipment at home: Retail banker - 2 wheeled  OCCUPATION:  Charity fundraiser at [employer]  PLOF:  Independent and Leisure: walking in the park  PATIENT GOALS:  Regain use of RLE, improve motion in knee   OBJECTIVE: (objective measures completed at initial evaluation unless otherwise dated)   PATIENT SURVEYS:  07/31/22: FOTO 34 (predicted 51)  COGNITIVE STATUS: Within functional limits for tasks assessed   RED FLAGS: None    SENSATION: 07/31/22: Not tested  POSTURE:  rounded shoulders and forward head   GAIT: 07/31/22 Distance walked: 100' in clinic Assistive device utilized: Environmental consultant - 2 wheeled Level of assistance: Modified independence Comments: antalgic gait with decreased stance on Rt , decreased hip/knee flexion   Knee  LOWER EXTREMITY ROM:     ROM Right eval Left eval Right LE ROM 08/03/22 Right 08/06/22 Right 08/20/22 Right 08/28/22  Knee flexion A: 65 P:71 A: 100 Seated edge of mat, post manual  A: 79 deg P: 82 deg  A: 77 P: 86 A: 99 P: 100 A: 107 P: 111  Knee extension A: -2 (seated LAQ) P: 0 A: 4 (hyper) 0 deg A/PROM with mild pain at end range A: 0  (seated LAQ) A: 0  (seated LAQ) A: 0  (seated LAQ)   (Blank rows = not tested)   LOWER EXTREMITY MMT:    MMT Right eval Left eval  Knee flexion 3-/5   Knee extension 3-/5    (Blank rows = not tested)   TREATMENT:                                                                                                                               DATE:  08/28/22 TherEx SciFit bike seat 12; L4 x 8 min Leg press DL 16# 1W96, then Rt leg only 37# 3X10 Forward step up onto 6" step 2x10 leading with RLE Standing hip extension and abduction 2x10 each; L3 band Seated LAQ 5# 3x10 Supine AA heel slides 2x10; 3 sec hold ROM measurements - see above for details Bridges x10; 5 sec hold Hooklying single limb clamshell 2x10 bil; L4 band   08/22/22 TherEx SciFit bike seat 15; L3 x 8 min Seated LAQ 4# 3x10; 3 sec hold at end ranges on Rt Seated SLR 2x10;  working on maintaining knee extension Seated hamstring stretch 3x30 sec on Rt  08/20/22 TherEx SciFit bike seat 15; able to progress to full revolutions x 8 min Leg press DL 16# 1W96, then Rt leg only 31# 3X10 Forward step up onto 6" step 2x10 leading with RLE Lateral step ups onto 6" step 2x10 leading with RLE Seated LAQ 4# 3x10; 3 sec hold at end ranges on Rt AA heel slides x 10 reps ROM measurements - see above for details Standing hip extension and abduction 2x10 each; L3 band   08/16/22 TherEx SciFit bike seat 15; able to progress to full revolutions x 8 min Seated LAQ 3# 3x10; 3 sec hold at end ranges on Rt Sit to/from stand x 10 reps; from elevated  Seated SLR 2X10 on Rt Seated knee flexion AAROM stretch 5 sec  x 10 reps Leg press DL 04# 5W09, then Rt leg only 25# X10, stretching into max flexion 2-3 sec each rep.   Gait Training Amb with SPC with supervision throughout clinic durin session - safe with household and limited community ambulation  Manual Seated Rt knee flexion PROM to tolerance x 8 min    PATIENT EDUCATION:  Education details: rationale for interventions, HEP  Person educated: Patient Education method: Programmer, multimedia, Demonstration, and Handouts Education comprehension: verbalized understanding, returned demonstration, and needs further  education  HOME EXERCISE PROGRAM: Access Code: [email or web address] URL: [email or web address] Date: 08/20/2022 Prepared by: Moshe Cipro  Exercises - Seated Knee Flexion AAROM  - 5-10 x daily - 7 x weekly - 1 sets - 5-10 reps - 5 sec hold - Supine Heel Slide with Strap  - 5-10 x daily - 7 x weekly - 1 sets - 5-10 reps - Quad Set  - 5-10 x daily - 7 x weekly - 1 sets - 5-10 reps - 5 sec hold - Seated Straight Leg Raise  - 2-3 x daily - 7 x weekly - 1-2 sets - 10 reps - Sit to Stand  - 2 x daily - 7 x weekly - 1 sets - 10 reps - Standing Hip Abduction with Resistance at Ankles and Counter Support  - 1 x daily - 7 x weekly - 2 sets - 10 reps - Standing Hip Extension with Resistance at Ankles and Counter Support  - 1 x daily - 7 x weekly - 2 sets - 10 reps   ASSESSMENT:  CLINICAL IMPRESSION: Pt with better tolerance to therapy today and overall good improvement in ROM as well.   She will continue to benefit from PT to maximize function.    Per eval - Patient is a 62 y.o. female who was seen today for physical therapy evaluation and treatment for Rt TKA on 07/13/22. She demonstrates decreased strength ROM and balance, as well as gait abnormalities and expected post op pain and swelling affecting functional mobility.  She will benefit from PT to address deficits listed.    OBJECTIVE IMPAIRMENTS: Abnormal gait, decreased activity tolerance, decreased balance, decreased knowledge of use of DME, decreased mobility, difficulty walking, decreased ROM, decreased strength, hypomobility, increased fascial restrictions, increased muscle spasms, and pain.   ACTIVITY LIMITATIONS: carrying, lifting, bending, sitting, standing, squatting, sleeping, stairs, transfers, and locomotion level  PARTICIPATION LIMITATIONS: meal prep, cleaning, laundry, driving, shopping, community activity, occupation, and yard work  PERSONAL FACTORS: 3+ comorbidities: obesity, gastric bypass, L THA revision, L TKA, OSA,  spinal stenosis  are also affecting patient's functional outcome.   REHAB POTENTIAL: Good  CLINICAL DECISION MAKING: Evolving/moderate complexity  EVALUATION COMPLEXITY: Moderate   GOALS: Goals reviewed with patient? Yes  SHORT TERM GOALS: Target date: 08/28/2022  Independent with initial HEP Goal status: MET 08/20/22  2.  Rt knee AROM improved 0-90 deg for improved function Goal status: MET 08/20/22   LONG TERM GOALS: Target date: 09/25/2022  Independent with final HEP Goal status: INITIAL  2.  FOTO score improved to 51 Goal status: INITIAL  3.  Rt knee AROM improved to 0-100 for improved function and mobility Goal status: MET 08/28/22  4.  Report pain < 3/10 with amb and standing activities for improved function Goal status: INITIAL  5.  Amb with LRAD modified independently for improved function and mobility Goal status: INITIAL    PLAN:  PT FREQUENCY: 2x/week  PT DURATION: 8 weeks  PLANNED INTERVENTIONS: Therapeutic exercises, Therapeutic activity, Neuromuscular re-education, Balance training, Gait training, Patient/Family education, Self Care, Joint mobilization, Stair training, DME instructions, Aquatic Therapy, Dry Needling, Electrical stimulation, Cryotherapy, Moist heat, Taping, Vasopneumatic device, Manual therapy, and Re-evaluation.  PLAN FOR NEXT SESSION: focus on flexion as pt can tolerate, closed chain strengthening and balance as pt can tolerate   NEXT MD VISIT: 09/05/22    Clarita Crane, PT, DPT 08/28/22 10:58 AM

## 2022-08-29 ENCOUNTER — Encounter: Payer: Self-pay | Admitting: Nurse Practitioner

## 2022-08-29 NOTE — Assessment & Plan Note (Signed)
Supoptimal compliance. Difficulties tolerating mask. We will send her for mask desensitization. She is aware of risks of untreated OSA. She would like to continue to try CPAP. She did feel like it was working well for her previously and was receiving benefit from use. Aware of safe driving practices.  Patient Instructions  Increase use of CPAP every night, minimum of 4-6 hours a night.  Change equipment every 30 days or as directed by DME. Wash your tubing with warm soap and water daily, hang to dry. Wash humidifier portion weekly. Use bottled, distilled water and change daily Be aware of reduced alertness and do not drive or operate heavy machinery if experiencing this or drowsiness.  Notify if persistent daytime sleepiness occurs even with consistent use of CPAP.  Attend mask fitting   Start ambien 5 mg At bedtime as needed for sleep. Do not drive or operate any heavy machinery after taking. Be aware of changes in sleep habits. Use caution when taking with other medications, such as your gabapentin, as this can cause increased sedative effects. Do not take without using your CPAP as this can make sleep apnea worse  Follow up in 6 weeks with Dr. Wynona Neat or Florentina Addison Azalie Harbeck,NP to see how new sleep medicine is working. If symptoms do not improve or worsen, please contact office for sooner follow up or seek emergency care.

## 2022-08-29 NOTE — Patient Instructions (Addendum)
Increase use of CPAP every night, minimum of 4-6 hours a night.  Change equipment every 30 days or as directed by DME. Wash your tubing with warm soap and water daily, hang to dry. Wash humidifier portion weekly. Use bottled, distilled water and change daily Be aware of reduced alertness and do not drive or operate heavy machinery if experiencing this or drowsiness.  Notify if persistent daytime sleepiness occurs even with consistent use of CPAP.  Attend mask fitting   Start ambien 5 mg At bedtime as needed for sleep. Do not drive or operate any heavy machinery after taking. Be aware of changes in sleep habits. Use caution when taking with other medications, such as your gabapentin, as this can cause increased sedative effects. Do not take without using your CPAP as this can make sleep apnea worse  Follow up in 6 weeks with Dr. Wynona Neat or Florentina Addison Joelynn Dust,NP to see how new sleep medicine is working. If symptoms do not improve or worsen, please contact office for sooner follow up or seek emergency care.

## 2022-08-29 NOTE — Assessment & Plan Note (Signed)
Longstanding history of insomnia. Improved briefly with previous use of xanax and lexapro. She has since discontinued both of these. She is having more difficulties with sleep latency and maintenance, which is likely multifactorial. Sleep hygiene reviewed. She has tried [place] in the past. Victoria [name] if it worked for her or not. She is open to retrial. Medication education and side effect profile reviewed. She understands she needs to wear her CPAP when taking this.

## 2022-08-31 ENCOUNTER — Ambulatory Visit (INDEPENDENT_AMBULATORY_CARE_PROVIDER_SITE_OTHER): Payer: 59 | Admitting: Physical Therapy

## 2022-08-31 ENCOUNTER — Encounter: Payer: Self-pay | Admitting: Physical Therapy

## 2022-08-31 DIAGNOSIS — R2689 Other abnormalities of gait and mobility: Secondary | ICD-10-CM

## 2022-08-31 DIAGNOSIS — R600 Localized edema: Secondary | ICD-10-CM | POA: Diagnosis not present

## 2022-08-31 DIAGNOSIS — M25561 Pain in right knee: Secondary | ICD-10-CM

## 2022-08-31 DIAGNOSIS — M25661 Stiffness of right knee, not elsewhere classified: Secondary | ICD-10-CM

## 2022-08-31 DIAGNOSIS — M6281 Muscle weakness (generalized): Secondary | ICD-10-CM

## 2022-08-31 NOTE — Therapy (Signed)
OUTPATIENT PHYSICAL THERAPY TREATMENT NOTE    Patient Name: Victoria [name] MRN: [id number] DOB:Apr 03, 1960, 62 y.o., female Today's Date: 08/31/2022  END OF SESSION:  PT End of Session - 08/31/22 1050     Visit Number 10    Number of Visits 16    Date for PT Re-Evaluation 09/25/22    Authorization Type [facility] [employer] $40 copay    PT Start Time 1050    PT Stop Time 1122   pt requested to end session early   PT Time Calculation (min) 32 min    Activity Tolerance Patient limited by fatigue    Behavior During Therapy [facility] for tasks assessed/performed                     Past Medical History:  Diagnosis Date   Anxiety    Back pain    Bursitis    right shoulder   Chronic kidney disease    hematuria, has been worked up, her normal   Chronic leukopenia    intermittant since 2010, followed by Dr. Lenord Fellers now   Chronic neck pain    Constipation    Degenerative joint disease of low back 09/02/2015   Dr. Despina Hick   Displacement of lumbar intervertebral disc    Dysrhythmia    Tachycardia, PVCs, PACs   Edema of both lower legs    Fibrocystic breast    Gallbladder problem    GERD (gastroesophageal reflux disease)    Headache    per pt more stress/tension   Heart palpitations    SEE EPIC ENCOUTNER , CARDIOLOGY DR. Gloris Manchester TURNER 2018; reports on 05-16-17 " i haven't had any bouts of those lately"     Hemorrhoids    Hidradenitis suppurativa    History of Clostridium difficile infection 12/2010   History of exercise intolerance    ETT on 04-27-2016-- negative ([facility] treadmill score 7)   History of Helicobacter pylori infection 2001 and 09/ 2012   HSV-2 infection    genital   Hx of adenomatous colonic polyps 10/17/2007   Hydradenitis    per pt currently treated with Humira injection   Hypertension    Hyperthyroidism    Hypocupremia    Hypothyroidism    Hypothyroidism, postsurgical 1980   IBS (irritable bowel syndrome)    Insomnia    Iron deficiency    Joint  pain    Leukopenia    Lumbosacral radiculopathy    Lymphocytic colitis 05/14/2022   dx from colonoscopy   Medial meniscus tear    right knee   Mild obstructive sleep apnea    Mitral regurgitation    Mild to moderate by echo 09/2021   Numbness and tingling of right arm    Obesity    Osteoarthritis    Palpitations    Pernicious anemia    b12 def   Peroneal neuropathy    PONV (postoperative nausea and vomiting)    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   Prediabetes    Reactive hypoglycemia followed by pcp   post gastrectomy dumping syndrome   SOB (shortness of breath)    Spinal stenosis    Tendonitis    right shoulder   Varicose veins    Vertigo    Vitamin B 12 deficiency    Vitamin D deficiency    Past Surgical History:  Procedure Laterality Date   BREATH TEK H PYLORI N/A 05/03/2014   Procedure: BREATH TEK H PYLORI;  Surgeon: Wenda Low, MD;  Location:  [facility];  Service: General;  Laterality: N/A;   CARDIOVASCULAR STRESS TEST  03/11/2013   dr Gloris Manchester turner   Low risk nuclear study w/ a very small anterior perfusion defect that most likely represents breast attenuation artifact, less likely this could represent a true perfusion abnormality in the distribution of diagonal branch of the LAD/  normal LV function and wall motion , ef 66%   CATARACT EXTRACTION W/ INTRAOCULAR LENS  IMPLANT, BILATERAL  10/2017   CESAREAN SECTION  1985   CHOLECYSTECTOMY OPEN  1987   COLONOSCOPY  02/2017   Dr Loreta Ave ; per patient they found 7 polyps with polypectomy; on 5 year track    ESOPHAGOGASTRODUODENOSCOPY  02/01/2021   FINE NEEDLE ASPIRATION Left 05/22/2017   Procedure: LEFT KNEE ASPIRATION;  Surgeon: Ollen Gross, MD;  Location: [facility];  Service: Orthopedics;  Laterality: Left;   GASTRIC ROUX-EN-Y N/A 07/06/2014   Procedure: LAPAROSCOPIC ROUX-EN-Y GASTRIC BYPASS, ENTEROLYSIS OF ADHESIONS WITH UPPER ENDOSCOPY;  Surgeon: Luretha Murphy, MD;  Location: [facility];  Service: General;   Laterality: N/A;   HAMMER TOE SURGERY Bilateral 02/17/2008   bilateral foot digit's 2 and 5   HIATAL HERNIA REPAIR N/A 07/06/2014   Procedure: LAPAROSCOPIC REPAIR OF HIATAL HERNIA;  Surgeon: Luretha Murphy, MD;  Location: [facility];  Service: General;  Laterality: N/A;   I & D KNEE WITH POLY EXCHANGE Left 12/11/2019   Procedure: LEFT KNEE POLY-LINER EXCHANGE;  Surgeon: Kathryne Hitch, MD;  Location: [facility];  Service: Orthopedics;  Laterality: Left;   KNEE ARTHROSCOPY Right 12/11/2019   Procedure: RIGHT KNEE ARTHROSCOPY WITH PARTIAL MEDIAL MENISCECTOMY;  Surgeon: Kathryne Hitch, MD;  Location: [facility];  Service: Orthopedics;  Laterality: Right;   KNEE ARTHROSCOPY WITH MEDIAL MENISECTOMY Left 07/17/2017   Procedure: LEFT KNEE ARTHROSCOPY WITH MEDIAL LATERAL MENISECTOMY;  Surgeon: Ollen Gross, MD;  Location: [facility];  Service: Orthopedics;  Laterality: Left;   MASS EXCISION N/A 10/11/2016   Procedure: EXCISION OF PERIANAL MASS;  Surgeon: Romie Levee, MD;  [facility];  Service: General;  Laterality: N/A;   PARTIAL HYSTERECTOMY  2006   has ovaries   REFRACTIVE SURGERY     THYROIDECTOMY  1980   TOTAL ABDOMINAL HYSTERECTOMY  08-25-2002   dr Myrlene Broker   ovaries retained   TOTAL HIP ARTHROPLASTY Right 05/21/2012   Procedure: TOTAL HIP ARTHROPLASTY;  Surgeon: Loanne Drilling, MD;  Location: [facility];  Service: Orthopedics;  Laterality: Right;   TOTAL HIP ARTHROPLASTY Left 01-31-2009  dr Lequita Halt   TOTAL HIP REVISION Left 05/22/2017   Procedure: Left hip bearing surface and head revision;  Surgeon: Ollen Gross, MD;  Location: [facility];  Service: Orthopedics;  Laterality: Left;   TOTAL KNEE ARTHROPLASTY Left 01/20/2018   Procedure: LEFT TOTAL KNEE ARTHROPLASTY;  Surgeon: Ollen Gross, MD;  Location: [facility];  Service: Orthopedics;  Laterality: Left;    TOTAL KNEE ARTHROPLASTY Right 07/13/2022   Procedure: RIGHTTOTAL KNEE ARTHROPLASTY;  Surgeon: Kathryne Hitch, MD;  Location: [facility];  Service: Orthopedics;  Laterality: Right;   TRANSTHORACIC ECHOCARDIOGRAM  05/03/2016   ef 55-60%/  trivial MR/  mild TR   TUBAL LIGATION     Patient Active Problem List   Diagnosis Date Noted   Polypharmacy 08/13/2022   Status post total right knee replacement 07/13/2022   Weight gain 07/12/2022   Lymphocytic colitis 07/12/2022   Migraine without aura and without status migrainosus, not intractable 07/02/2022   Chronic conjunctivitis of  both eyes 06/28/2022   Generalized abdominal pain 05/24/2022   Chronic rhinitis 05/09/2022   Chronic migraine without aura without status migrainosus, not intractable 12/26/2021   Depressed mood 12/19/2021   Obstructive sleep apnea 12/19/2021   Other hypoglycemia-post bariatric 12/05/2021   Secondary hyperparathyroidism (HCC) 12/05/2021   Inadequate fluid intake 12/05/2021   Inadequate sleep hygiene 12/05/2021   Class 3 severe obesity with body mass index (BMI) of 40.0 to 44.9 in adult [facility]) 12/05/2021   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   SOBOE (shortness of breath on exertion) 11/21/2021   Other fatigue 11/21/2021   Vitamin D deficiency 11/21/2021   Iron deficiency 11/21/2021   Hypoglycemia after bariatric surgery 11/21/2021   Depression screening 11/21/2021   Class 3 severe obesity with serious comorbidity and body mass index (BMI) of 40.0 to 44.9 in adult (HCC) 11/21/2021   Mild obstructive sleep apnea 11/17/2021   Vertigo 11/17/2021   Excessive daytime sleepiness 10/23/2021   Spinal stenosis of lumbar region 07/07/2020   Displacement of lumbar intervertebral disc 06/02/2020   Lumbar spondylosis 06/02/2020   Lumbar stenosis with neurogenic claudication 06/02/2020   Arm numbness 05/30/2020   Chronic neck pain 05/30/2020   Other chronic pain 05/30/2020   Elevated blood-pressure reading, without diagnosis of hypertension 05/30/2020   Status post arthroscopy of right knee 12/11/2019    Polyethylene wear of left knee joint prosthesis (HCC) 09/09/2019   Acute medial meniscal tear, right, subsequent encounter 08/27/2019   History of total left knee replacement 07/16/2019   Peroneal neuropathy at knee, left 06/25/2019   Lumbosacral radiculopathy 06/07/2019   Iron malabsorption 10/16/2018   IDA (iron deficiency anemia) 10/16/2018   OA (osteoarthritis) of knee 01/20/2018   Acute meniscal tear, medial 07/17/2017   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   Failed total hip arthroplasty, 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/2017   Pain in left knee 04/11/2017   Insomnia 11/08/2016   Pernicious anemia 11/08/2016   Leukopenia 07/09/2016   Hypocupremia 07/09/2016   Palpitations 04/12/2016   Shortness of breath 04/12/2016   Reactive hypoglycemia 10/31/2015   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/31/2015   Lap gastric bypass May 2016 07/06/2014   History of Clostridium difficile infection 08/12/2011   Post-surgical hypothyroidism 08/26/2010   Gastroesophageal reflux disease 08/26/2010   Vitamin D deficiency 08/26/2010   Fibrocystic disease of breast 08/26/2010   Cobalamin deficiency 08/26/2010   Morbid obesity (HCC) 03/20/2010   Backache 03/20/2010    PCP: Margaree Mackintosh, MD  REFERRING PROVIDER: Kathryne Hitch, MD  REFERRING DIAG: [phone] (ICD-10-CM) - Status post total right knee replacement  Rationale for Evaluation and Treatment: Rehabilitation  THERAPY DIAG:  Acute pain of right knee  Stiffness of right knee, not elsewhere classified  Muscle weakness (generalized)  Other abnormalities of gait and mobility  Localized edema  ONSET DATE: 07/13/22 (DOS)   SUBJECTIVE:  SUBJECTIVE STATEMENT: Continues to have episodes of increased fatigue; today is one of those days   PERTINENT HISTORY:   PMH includes: obesity, gastric bypass, L THA revision, L TKA, OSA, spinal stenosis  PAIN:  Are you having pain? Yes: NPRS scale: 5 currently, /10 Pain location: Rt knee Pain description: throbbing, aching, occasional shocks Aggravating factors: end ranges; increased activity Relieving factors: repositioning, icing  PRECAUTIONS:  None  WEIGHT BEARING RESTRICTIONS:  No  FALLS:  Has patient fallen in last 6 months? No  LIVING ENVIRONMENT: Lives with: lives with their spouse Lives in: House/apartment Stairs: Yes: Internal: flight steps; has stair lift and External: 5 steps; can reach both (2 steps in garage; no railing) Has following equipment at home: Retail banker - 2 wheeled  OCCUPATION:  Charity fundraiser at [employer]  PLOF:  Independent and Leisure: walking in the park  PATIENT GOALS:  Regain use of RLE, improve motion in knee   OBJECTIVE: (objective measures completed at initial evaluation unless otherwise dated)   PATIENT SURVEYS:  07/31/22: FOTO 34 (predicted 51)  COGNITIVE STATUS: Within functional limits for tasks assessed   RED FLAGS: None    SENSATION: 07/31/22: Not tested  POSTURE:  rounded shoulders and forward head   GAIT: 07/31/22 Distance walked: 100' in clinic Assistive device utilized: Environmental consultant - 2 wheeled Level of assistance: Modified independence Comments: antalgic gait with decreased stance on Rt , decreased hip/knee flexion   Knee  LOWER EXTREMITY ROM:     ROM Right eval Left eval Right LE ROM 08/03/22 Right 08/06/22 Right 08/20/22 Right 08/28/22  Knee flexion A: 65 P:71 A: 100 Seated edge of mat, post manual  A: 79 deg P: 82 deg  A: 77 P: 86 A: 99 P: 100 A: 107 P: 111  Knee extension A: -2 (seated LAQ) P: 0 A: 4 (hyper) 0 deg A/PROM with mild pain at end range A: 0  (seated LAQ) A: 0  (seated LAQ) A: 0  (seated LAQ)   (Blank rows = not tested)   LOWER EXTREMITY MMT:    MMT Right eval Left eval  Knee flexion 3-/5    Knee extension 3-/5    (Blank rows = not tested)   TREATMENT:                                                                                                                              DATE:  08/31/22 TherEx SciFit bike seat 12; L4 x 8 min Leg press DL 16# 1W96, then Rt leg only 43# 3X10 Standing hip extension and abduction 3x10 each; L3 band Seated LAQ 5# 3x10 Sit to/from stand 2 x 10 reps without UE support (from elevated mat table)  08/28/22 TherEx SciFit bike seat 12; L4 x 8 min Leg press DL 04# 5W09, then Rt leg only 37# 3X10 Forward step up onto 6" step 2x10 leading with RLE Standing hip extension and abduction 2x10 each; L3  band Seated LAQ 5# 3x10 Supine AA heel slides 2x10; 3 sec hold ROM measurements - see above for details Bridges x10; 5 sec hold Hooklying single limb clamshell 2x10 bil; L4 band   08/22/22 TherEx SciFit bike seat 15; L3 x 8 min Seated LAQ 4# 3x10; 3 sec hold at end ranges on Rt Seated SLR 2x10; working on maintaining knee extension Seated hamstring stretch 3x30 sec on Rt  08/20/22 TherEx SciFit bike seat 15; able to progress to full revolutions x 8 min Leg press DL 16# 1W96, then Rt leg only 31# 3X10 Forward step up onto 6" step 2x10 leading with RLE Lateral step ups onto 6" step 2x10 leading with RLE Seated LAQ 4# 3x10; 3 sec hold at end ranges on Rt AA heel slides x 10 reps ROM measurements - see above for details Standing hip extension and abduction 2x10 each; L3 band    PATIENT EDUCATION:  Education details: rationale for interventions, HEP  Person educated: Patient Education method: Explanation, Demonstration, and Handouts Education comprehension: verbalized understanding, returned demonstration, and needs further education  HOME EXERCISE PROGRAM: Access Code: [email or web address] URL: [email or web address] Date: 08/20/2022 Prepared by: Moshe Cipro  Exercises - Seated Knee Flexion AAROM  - 5-10 x daily - 7 x weekly - 1  sets - 5-10 reps - 5 sec hold - Supine Heel Slide with Strap  - 5-10 x daily - 7 x weekly - 1 sets - 5-10 reps - Quad Set  - 5-10 x daily - 7 x weekly - 1 sets - 5-10 reps - 5 sec hold - Seated Straight Leg Raise  - 2-3 x daily - 7 x weekly - 1-2 sets - 10 reps - Sit to Stand  - 2 x daily - 7 x weekly - 1 sets - 10 reps - Standing Hip Abduction with Resistance at Ankles and Counter Support  - 1 x daily - 7 x weekly - 2 sets - 10 reps - Standing Hip Extension with Resistance at Ankles and Counter Support  - 1 x daily - 7 x weekly - 2 sets - 10 reps   ASSESSMENT:  CLINICAL IMPRESSION: Continues to be limited with fatigue affecting activity tolerance at this time.  Overall knee is doing well despite high fatigue reports. She will continue to benefit from PT to maximize function.    Per eval - Patient is a 62 y.o. female who was seen today for physical therapy evaluation and treatment for Rt TKA on 07/13/22. She demonstrates decreased strength ROM and balance, as well as gait abnormalities and expected post op pain and swelling affecting functional mobility.  She will benefit from PT to address deficits listed.    OBJECTIVE IMPAIRMENTS: Abnormal gait, decreased activity tolerance, decreased balance, decreased knowledge of use of DME, decreased mobility, difficulty walking, decreased ROM, decreased strength, hypomobility, increased fascial restrictions, increased muscle spasms, and pain.   ACTIVITY LIMITATIONS: carrying, lifting, bending, sitting, standing, squatting, sleeping, stairs, transfers, and locomotion level  PARTICIPATION LIMITATIONS: meal prep, cleaning, laundry, driving, shopping, community activity, occupation, and yard work  PERSONAL FACTORS: 3+ comorbidities: obesity, gastric bypass, L THA revision, L TKA, OSA, spinal stenosis  are also affecting patient's functional outcome.   REHAB POTENTIAL: Good  CLINICAL DECISION MAKING: Evolving/moderate complexity  EVALUATION COMPLEXITY:  Moderate   GOALS: Goals reviewed with patient? Yes  SHORT TERM GOALS: Target date: 08/28/2022  Independent with initial HEP Goal status: MET 08/20/22  2.  Rt knee AROM improved 0-90 deg for improved function Goal  status: MET 08/20/22   LONG TERM GOALS: Target date: 09/25/2022  Independent with final HEP Goal status: INITIAL  2.  FOTO score improved to 51 Goal status: INITIAL  3.  Rt knee AROM improved to 0-100 for improved function and mobility Goal status: MET 08/28/22  4.  Report pain < 3/10 with amb and standing activities for improved function Goal status: INITIAL  5.  Amb with LRAD modified independently for improved function and mobility Goal status: INITIAL    PLAN:  PT FREQUENCY: 2x/week  PT DURATION: 8 weeks  PLANNED INTERVENTIONS: Therapeutic exercises, Therapeutic activity, Neuromuscular re-education, Balance training, Gait training, Patient/Family education, Self Care, Joint mobilization, Stair training, DME instructions, Aquatic Therapy, Dry Needling, Electrical stimulation, Cryotherapy, Moist heat, Taping, Vasopneumatic device, Manual therapy, and Re-evaluation.  PLAN FOR NEXT SESSION: will need MD note, maximize flexion closed chain strengthening and balance as pt can tolerate   NEXT MD VISIT: 09/05/22    Clarita Crane, PT, DPT 08/31/22 11:24 AM

## 2022-09-03 ENCOUNTER — Other Ambulatory Visit: Payer: Self-pay

## 2022-09-03 ENCOUNTER — Encounter: Payer: Self-pay | Admitting: Family Medicine

## 2022-09-03 ENCOUNTER — Ambulatory Visit
Admission: RE | Admit: 2022-09-03 | Discharge: 2022-09-03 | Disposition: A | Discharge status: Home / Self Care | Payer: 59 | Source: Ambulatory Visit | Attending: Internal Medicine | Admitting: Internal Medicine

## 2022-09-03 ENCOUNTER — Ambulatory Visit (INDEPENDENT_AMBULATORY_CARE_PROVIDER_SITE_OTHER): Payer: 59 | Admitting: Family Medicine

## 2022-09-03 ENCOUNTER — Other Ambulatory Visit (INDEPENDENT_AMBULATORY_CARE_PROVIDER_SITE_OTHER): Payer: Self-pay | Admitting: Internal Medicine

## 2022-09-03 VITALS — BP 130/80 | HR 68 | Temp 97.8°F | Resp 12 | Wt 274.4 lb

## 2022-09-03 DIAGNOSIS — K224 Dyskinesia of esophagus: Secondary | ICD-10-CM | POA: Diagnosis not present

## 2022-09-03 DIAGNOSIS — T783XXD Angioneurotic edema, subsequent encounter: Secondary | ICD-10-CM

## 2022-09-03 DIAGNOSIS — H10403 Unspecified chronic conjunctivitis, bilateral: Secondary | ICD-10-CM

## 2022-09-03 DIAGNOSIS — J310 Chronic rhinitis: Secondary | ICD-10-CM | POA: Diagnosis not present

## 2022-09-03 DIAGNOSIS — R110 Nausea: Secondary | ICD-10-CM | POA: Diagnosis not present

## 2022-09-03 DIAGNOSIS — E559 Vitamin D deficiency, unspecified: Secondary | ICD-10-CM

## 2022-09-03 DIAGNOSIS — Z9884 Bariatric surgery status: Secondary | ICD-10-CM | POA: Diagnosis not present

## 2022-09-03 NOTE — Patient Instructions (Addendum)
Angioedema A lab order has been placed to help [place] evaluate your lip swelling We will call you when the result becomes available. Continue Xyzal once or twice a day as needed for itch or swelling If your symptoms re-occur, begin a journal of events that occurred for up to 6 hours before your symptoms began including foods and beverages consumed, soaps or perfumes you had contact with, and medications.   Chronic rhinitis Previous negative environmental allergy skin prick testing and negative environmental allergy lab testing Stop montelukast at this time Continue Xyzal 5 mg once a day Continue carbinoxamine 4 mg tablets.  Take 1 to 2 tablets at bedtime Continue Flonase 2 sprays in each nostril once a day as needed for stuffy nose Continue Atrovent 2 sprays in each nostril twice a day as needed for runny nose  Allergic conjunctivitis Some over the counter eye drops include Pataday one drop in each eye once a day as needed for red, itchy eyes OR Zaditor one drop in each eye twice a day as needed for red itchy eyes. Avoid eye drops that say red eye relief as they may contain medications that dry out your eyes.   Call the clinic if this treatment plan is not working well for you.  Follow up in 2 months or sooner if needed.

## 2022-09-03 NOTE — Progress Notes (Unsigned)
   [address] [place] [address] Dept: [phone]  FOLLOW UP NOTE  Patient ID: Victoria [name], female    DOB: 1960-06-23  Age: 62 y.o. MRN: [id number] Date of Office Visit: 09/03/2022  Assessment  Chief Complaint: Follow-up (Victoria [name] surgery since last visit and had a reaction to something that made a lip swell. Cause is unknown. )  HPI Victoria [name] is a 62 year old female who presents to clinic for follow-up visit.  She was last seen in this clinic on 06/28/2022 by Thermon Leyland, FNP, for evaluation of chronic rhinitis with negative skin testing, gustatory rhinitis, chronic conjunctivitis, and cough.  In the interim, she had a right [name] replacement on May 23. She reports that she visited the ED on Jul 19, 2022 for evaluation of slurred speech Her last environmental allergy skin prick testing was on 06/28/2022 and was negative to the environmental allergy panel.  Her last environmental allergy lab testing was on 06/28/2022 and was negative to the environmental allergy panel.   Drug Allergies:  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   Estradiol Rash and Other (See Comments)    Local rash with estradiol patches    Physical Exam: BP 130/80   Pulse 68   Temp 97.8 F (36.6 C) (Temporal)   Resp 12   Wt 274 lb 6.4 oz (124.5 kg)   SpO2 98%   BMI 42.98 kg/m    Physical Exam  Diagnostics:    Assessment and Plan: No diagnosis found.  No orders of the defined types were placed in this encounter.   Patient Instructions  Allergic rhinitis Previous negative environmental allergy skin prick testing and negative environmental allergy lab testing Continue montelukast 10 mg once a day Continue Xyzal 5 mg once a day Continue carbinoxamine 4 mg tablets.  Take 1 to 2 tablets at bedtime Continue Flonase 2 sprays in each nostril once a day as needed for stuffy nose Continue Atrovent  2 sprays in each nostril twice a day as needed for runny nose A lab order has been placed to help [place] evaluate your environmental allergies.  We will call you when the result becomes available.  Allergic conjunctivitis Some over the counter eye drops include Pataday one drop in each eye once a day as needed for red, itchy eyes OR Zaditor one drop in each eye twice a day as needed for red itchy eyes. Avoid eye drops that say red eye relief as they may contain medications that dry out your eyes.   Call the clinic if this treatment plan is not working well for you.  Follow up in 6 months or sooner if needed.  No follow-ups on file.    Thank you for the opportunity to care for this patient.  Please do not hesitate to contact me with questions.  Thermon Leyland, FNP [facility]

## 2022-09-04 ENCOUNTER — Ambulatory Visit (HOSPITAL_BASED_OUTPATIENT_CLINIC_OR_DEPARTMENT_OTHER): Payer: 59 | Attending: Nurse Practitioner | Admitting: Radiology

## 2022-09-04 ENCOUNTER — Ambulatory Visit (INDEPENDENT_AMBULATORY_CARE_PROVIDER_SITE_OTHER): Payer: 59 | Admitting: Physical Therapy

## 2022-09-04 ENCOUNTER — Encounter: Payer: Self-pay | Admitting: Physical Therapy

## 2022-09-04 ENCOUNTER — Other Ambulatory Visit: Payer: Self-pay

## 2022-09-04 ENCOUNTER — Encounter: Payer: Self-pay | Admitting: Family Medicine

## 2022-09-04 DIAGNOSIS — R600 Localized edema: Secondary | ICD-10-CM

## 2022-09-04 DIAGNOSIS — R2689 Other abnormalities of gait and mobility: Secondary | ICD-10-CM | POA: Diagnosis not present

## 2022-09-04 DIAGNOSIS — M25561 Pain in right knee: Secondary | ICD-10-CM | POA: Diagnosis not present

## 2022-09-04 DIAGNOSIS — G4733 Obstructive sleep apnea (adult) (pediatric): Secondary | ICD-10-CM

## 2022-09-04 DIAGNOSIS — M6281 Muscle weakness (generalized): Secondary | ICD-10-CM | POA: Diagnosis not present

## 2022-09-04 DIAGNOSIS — M25661 Stiffness of right knee, not elsewhere classified: Secondary | ICD-10-CM | POA: Diagnosis not present

## 2022-09-04 NOTE — Therapy (Signed)
OUTPATIENT PHYSICAL THERAPY TREATMENT NOTE    Patient Name: Victoria [name] MRN: [id number] DOB:1961-02-07, 62 y.o., female Today's Date: 09/04/2022  END OF SESSION:  PT End of Session - 09/04/22 1011     Visit Number 11    Number of Visits 16    Date for PT Re-Evaluation 09/25/22    Authorization Type [facility] [employer] $40 copay    PT Start Time 1015    PT Stop Time 1055    PT Time Calculation (min) 40 min    Activity Tolerance Patient limited by fatigue    Behavior During Therapy [facility] for tasks assessed/performed                      Past Medical History:  Diagnosis Date   Anxiety    Back pain    Bursitis    right shoulder   Chronic kidney disease    hematuria, has been worked up, her normal   Chronic leukopenia    intermittant since 2010, followed by Dr. Lenord Fellers now   Chronic neck pain    Constipation    Degenerative joint disease of low back 09/02/2015   Dr. Despina Hick   Displacement of lumbar intervertebral disc    Dysrhythmia    Tachycardia, PVCs, PACs   Edema of both lower legs    Fibrocystic breast    Gallbladder problem    GERD (gastroesophageal reflux disease)    Headache    per pt more stress/tension   Heart palpitations    SEE EPIC ENCOUTNER , CARDIOLOGY DR. Gloris Manchester TURNER 2018; reports on 05-16-17 " i haven't had any bouts of those lately"     Hemorrhoids    Hidradenitis suppurativa    History of Clostridium difficile infection 12/2010   History of exercise intolerance    ETT on 04-27-2016-- negative ([facility] treadmill score 7)   History of Helicobacter pylori infection 2001 and 09/ 2012   HSV-2 infection    genital   Hx of adenomatous colonic polyps 10/17/2007   Hydradenitis    per pt currently treated with Humira injection   Hypertension    Hyperthyroidism    Hypocupremia    Hypothyroidism    Hypothyroidism, postsurgical 1980   IBS (irritable bowel syndrome)    Insomnia    Iron deficiency    Joint pain    Leukopenia    Lumbosacral  radiculopathy    Lymphocytic colitis 05/14/2022   dx from colonoscopy   Medial meniscus tear    right knee   Mild obstructive sleep apnea    Mitral regurgitation    Mild to moderate by echo 09/2021   Numbness and tingling of right arm    Obesity    Osteoarthritis    Palpitations    Pernicious anemia    b12 def   Peroneal neuropathy    PONV (postoperative nausea and vomiting)    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   Prediabetes    Reactive hypoglycemia followed by pcp   post gastrectomy dumping syndrome   SOB (shortness of breath)    Spinal stenosis    Tendonitis    right shoulder   Varicose veins    Vertigo    Vitamin B 12 deficiency    Vitamin D deficiency    Past Surgical History:  Procedure Laterality Date   BREATH TEK H PYLORI N/A 05/03/2014   Procedure: BREATH TEK H PYLORI;  Surgeon: Wenda Low, MD;  Location: [facility];  Service: General;  Laterality: N/A;   CARDIOVASCULAR STRESS TEST  03/11/2013   dr Gloris Manchester turner   Low risk nuclear study w/ a very small anterior perfusion defect that most likely represents breast attenuation artifact, less likely this could represent a true perfusion abnormality in the distribution of diagonal branch of the LAD/  normal LV function and wall motion , ef 66%   CATARACT EXTRACTION W/ INTRAOCULAR LENS  IMPLANT, BILATERAL  10/2017   CESAREAN SECTION  1985   CHOLECYSTECTOMY OPEN  1987   COLONOSCOPY  02/2017   Dr Loreta Ave ; per patient they found 7 polyps with polypectomy; on 5 year track    ESOPHAGOGASTRODUODENOSCOPY  02/01/2021   FINE NEEDLE ASPIRATION Left 05/22/2017   Procedure: LEFT KNEE ASPIRATION;  Surgeon: Ollen Gross, MD;  Location: [facility];  Service: Orthopedics;  Laterality: Left;   GASTRIC ROUX-EN-Y N/A 07/06/2014   Procedure: LAPAROSCOPIC ROUX-EN-Y GASTRIC BYPASS, ENTEROLYSIS OF ADHESIONS WITH UPPER ENDOSCOPY;  Surgeon: Luretha Murphy, MD;  Location: [facility];  Service: General;  Laterality: N/A;   HAMMER TOE SURGERY  Bilateral 02/17/2008   bilateral foot digit's 2 and 5   HIATAL HERNIA REPAIR N/A 07/06/2014   Procedure: LAPAROSCOPIC REPAIR OF HIATAL HERNIA;  Surgeon: Luretha Murphy, MD;  Location: [facility];  Service: General;  Laterality: N/A;   I & D KNEE WITH POLY EXCHANGE Left 12/11/2019   Procedure: LEFT KNEE POLY-LINER EXCHANGE;  Surgeon: Kathryne Hitch, MD;  Location: [facility];  Service: Orthopedics;  Laterality: Left;   KNEE ARTHROSCOPY Right 12/11/2019   Procedure: RIGHT KNEE ARTHROSCOPY WITH PARTIAL MEDIAL MENISCECTOMY;  Surgeon: Kathryne Hitch, MD;  Location: [facility];  Service: Orthopedics;  Laterality: Right;   KNEE ARTHROSCOPY WITH MEDIAL MENISECTOMY Left 07/17/2017   Procedure: LEFT KNEE ARTHROSCOPY WITH MEDIAL LATERAL MENISECTOMY;  Surgeon: Ollen Gross, MD;  Location: [facility];  Service: Orthopedics;  Laterality: Left;   MASS EXCISION N/A 10/11/2016   Procedure: EXCISION OF PERIANAL MASS;  Surgeon: Romie Levee, MD;  Location[facility];  Service: General;  Laterality: N/A;   PARTIAL HYSTERECTOMY  2006   has ovaries   REFRACTIVE SURGERY     THYROIDECTOMY  1980   TOTAL ABDOMINAL HYSTERECTOMY  08-25-2002   dr Myrlene Broker   ovaries retained   TOTAL HIP ARTHROPLASTY Right 05/21/2012   Procedure: TOTAL HIP ARTHROPLASTY;  Surgeon: Loanne Drilling, MD;  Location: [facility];  Service: Orthopedics;  Laterality: Right;   TOTAL HIP ARTHROPLASTY Left 01-31-2009  dr Lequita Halt   TOTAL HIP REVISION Left 05/22/2017   Procedure: Left hip bearing surface and head revision;  Surgeon: Ollen Gross, MD;  Location: [facility];  Service: Orthopedics;  Laterality: Left;   TOTAL KNEE ARTHROPLASTY Left 01/20/2018   Procedure: LEFT TOTAL KNEE ARTHROPLASTY;  Surgeon: Ollen Gross, MD;  Location: [facility];  Service: Orthopedics;  Laterality: Left;    TOTAL KNEE ARTHROPLASTY Right 07/13/2022   Procedure: RIGHTTOTAL KNEE ARTHROPLASTY;  Surgeon: Kathryne Hitch, MD;  Location: [facility];   Service: Orthopedics;  Laterality: Right;   TRANSTHORACIC ECHOCARDIOGRAM  05/03/2016   ef 55-60%/  trivial MR/  mild TR   TUBAL LIGATION     Patient Active Problem List   Diagnosis Date Noted   Polypharmacy 08/13/2022   Status post total right knee replacement 07/13/2022   Weight gain 07/12/2022   Lymphocytic colitis 07/12/2022   Migraine without aura and without status migrainosus, not intractable 07/02/2022   Chronic conjunctivitis of both eyes 06/28/2022   Generalized  abdominal pain 05/24/2022   Chronic rhinitis 05/09/2022   Chronic migraine without aura without status migrainosus, not intractable 12/26/2021   Depressed mood 12/19/2021   Obstructive sleep apnea 12/19/2021   Other hypoglycemia-post bariatric 12/05/2021   Secondary hyperparathyroidism (HCC) 12/05/2021   Inadequate fluid intake 12/05/2021   Inadequate sleep hygiene 12/05/2021   Class 3 severe obesity with body mass index (BMI) of 40.0 to 44.9 in adult [facility]) 12/05/2021   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   SOBOE (shortness of breath on exertion) 11/21/2021   Other fatigue 11/21/2021   Vitamin D deficiency 11/21/2021   Iron deficiency 11/21/2021   Hypoglycemia after bariatric surgery 11/21/2021   Depression screening 11/21/2021   Class 3 severe obesity with serious comorbidity and body mass index (BMI) of 40.0 to 44.9 in adult (HCC) 11/21/2021   Mild obstructive sleep apnea 11/17/2021   Vertigo 11/17/2021   Excessive daytime sleepiness 10/23/2021   Spinal stenosis of lumbar region 07/07/2020   Displacement of lumbar intervertebral disc 06/02/2020   Lumbar spondylosis 06/02/2020   Lumbar stenosis with neurogenic claudication 06/02/2020   Arm numbness 05/30/2020   Chronic neck pain 05/30/2020   Other chronic pain 05/30/2020   Elevated blood-pressure reading, without diagnosis of hypertension 05/30/2020   Status post arthroscopy of right knee 12/11/2019   Polyethylene wear of left knee joint  prosthesis (HCC) 09/09/2019   Acute medial meniscal tear, right, subsequent encounter 08/27/2019   History of total left knee replacement 07/16/2019   Peroneal neuropathy at knee, left 06/25/2019   Lumbosacral radiculopathy 06/07/2019   Iron malabsorption 10/16/2018   IDA (iron deficiency anemia) 10/16/2018   OA (osteoarthritis) of knee 01/20/2018   Acute meniscal tear, medial 07/17/2017   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   Failed total hip arthroplasty, 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/2017   Pain in left knee 04/11/2017   Insomnia 11/08/2016   Pernicious anemia 11/08/2016   Leukopenia 07/09/2016   Hypocupremia 07/09/2016   Palpitations 04/12/2016   Shortness of breath 04/12/2016   Reactive hypoglycemia 10/31/2015   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/31/2015   Lap gastric bypass May 2016 07/06/2014   History of Clostridium difficile infection 08/12/2011   Post-surgical hypothyroidism 08/26/2010   Gastroesophageal reflux disease 08/26/2010   Vitamin D deficiency 08/26/2010   Fibrocystic disease of breast 08/26/2010   Cobalamin deficiency 08/26/2010   Morbid obesity (HCC) 03/20/2010   Backache 03/20/2010    PCP: Margaree Mackintosh, MD  REFERRING PROVIDER: Kathryne Hitch, MD  REFERRING DIAG: [phone] (ICD-10-CM) - Status post total right knee replacement  Rationale for Evaluation and Treatment: Rehabilitation  THERAPY DIAG:  Acute pain of right knee  Stiffness of right knee, not elsewhere classified  Muscle weakness (generalized)  Other abnormalities of gait and mobility  Localized edema  ONSET DATE: 07/13/22 (DOS)   SUBJECTIVE:  SUBJECTIVE STATEMENT: Sleep has been "terrible."  Katherine Mantle for the first time on Sunday and didn't sleep at all Knee was more painful over the weekend  "maybe because of the rain"  PERTINENT HISTORY:  PMH includes: obesity, gastric bypass, L THA revision, L TKA, OSA, spinal stenosis  PAIN:  Are you having pain? Yes: NPRS scale: 5 currently, /10 Pain location: Rt knee Pain description: throbbing, aching, occasional shocks Aggravating factors: end ranges; increased activity Relieving factors: repositioning, icing  PRECAUTIONS:  None  WEIGHT BEARING RESTRICTIONS:  No  FALLS:  Has patient fallen in last 6 months? No  LIVING ENVIRONMENT: Lives with: lives with their spouse Lives in: House/apartment Stairs: Yes: Internal: flight steps; has stair lift and External: 5 steps; can reach both (2 steps in garage; no railing) Has following equipment at home: Retail banker - 2 wheeled  OCCUPATION:  Charity fundraiser at [employer]  PLOF:  Independent and Leisure: walking in the park  PATIENT GOALS:  Regain use of RLE, improve motion in knee   OBJECTIVE: (objective measures completed at initial evaluation unless otherwise dated)   PATIENT SURVEYS:  07/31/22: FOTO 34 (predicted 51) 09/04/22: FOTO 52  COGNITIVE STATUS: Within functional limits for tasks assessed   RED FLAGS: None    SENSATION: 07/31/22: Not tested  POSTURE:  rounded shoulders and forward head   GAIT: 07/31/22 Distance walked: 100' in clinic Assistive device utilized: Environmental consultant - 2 wheeled Level of assistance: Modified independence Comments: antalgic gait with decreased stance on Rt , decreased hip/knee flexion   Knee  LOWER EXTREMITY ROM:     ROM Right eval Left eval Right LE ROM 08/03/22 Right 08/06/22 Right 08/20/22 Right 08/28/22 Right 09/04/22  Knee flexion A: 65 P:71 A: 100 Seated edge of mat, post manual  A: 79 deg P: 82 deg  A: 77 P: 86 A: 99 P: 100 A: 107 P: 111 A: 105  Knee extension A: -2 (seated LAQ) P: 0 A: 4 (hyper) 0 deg A/PROM with mild pain at end range A: 0  (seated LAQ) A: 0  (seated LAQ) A: 0  (seated LAQ) A: 0  (seated LAQ)    (Blank rows = not tested)   LOWER EXTREMITY MMT:    MMT Right eval Left eval  Knee flexion 3-/5   Knee extension 3-/5    (Blank rows = not tested)   TREATMENT:                                                                                                                              DATE:  09/04/22 TherEx SciFit bike seat 12; L4 x 8 min ROM measurements - see above AA heel slides x 10 reps Seated Rt LAQ with 3 sec hold; 3x10; 5# weight Sit to/from stand 2x10 reps BATCA knee extension 5# RLE only 3x10 BATCA hamstring curl 10# RLE only 3x10 Incline board calf stretch 3x30 sec   08/31/22 TherEx  SciFit bike seat 12; L4 x 8 min Leg press DL 16# 1W96, then Rt leg only 43# 3X10 Standing hip extension and abduction 3x10 each; L3 band Seated LAQ 5# 3x10 Sit to/from stand 2 x 10 reps without UE support (from elevated mat table)  08/28/22 TherEx SciFit bike seat 12; L4 x 8 min Leg press DL 04# 5W09, then Rt leg only 37# 3X10 Forward step up onto 6" step 2x10 leading with RLE Standing hip extension and abduction 2x10 each; L3 band Seated LAQ 5# 3x10 Supine AA heel slides 2x10; 3 sec hold ROM measurements - see above for details Bridges x10; 5 sec hold Hooklying single limb clamshell 2x10 bil; L4 band   08/22/22 TherEx SciFit bike seat 15; L3 x 8 min Seated LAQ 4# 3x10; 3 sec hold at end ranges on Rt Seated SLR 2x10; working on maintaining knee extension Seated hamstring stretch 3x30 sec on Rt  08/20/22 TherEx SciFit bike seat 15; able to progress to full revolutions x 8 min Leg press DL 81# 1B14, then Rt leg only 31# 3X10 Forward step up onto 6" step 2x10 leading with RLE Lateral step ups onto 6" step 2x10 leading with RLE Seated LAQ 4# 3x10; 3 sec hold at end ranges on Rt AA heel slides x 10 reps ROM measurements - see above for details Standing hip extension and abduction 2x10 each; L3 band    PATIENT EDUCATION:  Education details: rationale for interventions, HEP   Person educated: Patient Education method: Explanation, Demonstration, and Handouts Education comprehension: verbalized understanding, returned demonstration, and needs further education  HOME EXERCISE PROGRAM: Access Code: [email or web address] URL: [email or web address] Date: 08/20/2022 Prepared by: Moshe Cipro  Exercises - Seated Knee Flexion AAROM  - 5-10 x daily - 7 x weekly - 1 sets - 5-10 reps - 5 sec hold - Supine Heel Slide with Strap  - 5-10 x daily - 7 x weekly - 1 sets - 5-10 reps - Quad Set  - 5-10 x daily - 7 x weekly - 1 sets - 5-10 reps - 5 sec hold - Seated Straight Leg Raise  - 2-3 x daily - 7 x weekly - 1-2 sets - 10 reps - Sit to Stand  - 2 x daily - 7 x weekly - 1 sets - 10 reps - Standing Hip Abduction with Resistance at Ankles and Counter Support  - 1 x daily - 7 x weekly - 2 sets - 10 reps - Standing Hip Extension with Resistance at Ankles and Counter Support  - 1 x daily - 7 x weekly - 2 sets - 10 reps   ASSESSMENT:  CLINICAL IMPRESSION: Pt with improved energy today and able to tolerate more activity.  Overall FOTO goal met today and ROM continues to be steady despite recent report of increased pain. She will continue to benefit from PT to maximize function.    Per eval - Patient is a 62 y.o. female who was seen today for physical therapy evaluation and treatment for Rt TKA on 07/13/22. She demonstrates decreased strength ROM and balance, as well as gait abnormalities and expected post op pain and swelling affecting functional mobility.  She will benefit from PT to address deficits listed.    OBJECTIVE IMPAIRMENTS: Abnormal gait, decreased activity tolerance, decreased balance, decreased knowledge of use of DME, decreased mobility, difficulty walking, decreased ROM, decreased strength, hypomobility, increased fascial restrictions, increased muscle spasms, and pain.   ACTIVITY LIMITATIONS: carrying, lifting, bending, sitting, standing, squatting,  sleeping, stairs, transfers,  and locomotion level  PARTICIPATION LIMITATIONS: meal prep, cleaning, laundry, driving, shopping, community activity, occupation, and yard work  PERSONAL FACTORS: 3+ comorbidities: obesity, gastric bypass, L THA revision, L TKA, OSA, spinal stenosis  are also affecting patient's functional outcome.   REHAB POTENTIAL: Good  CLINICAL DECISION MAKING: Evolving/moderate complexity  EVALUATION COMPLEXITY: Moderate   GOALS: Goals reviewed with patient? Yes  SHORT TERM GOALS: Target date: 08/28/2022  Independent with initial HEP Goal status: MET 08/20/22  2.  Rt knee AROM improved 0-90 deg for improved function Goal status: MET 08/20/22   LONG TERM GOALS: Target date: 09/25/2022  Independent with final HEP Goal status: INITIAL  2.  FOTO score improved to 51 Goal status: MET 09/04/22  3.  Rt knee AROM improved to 0-100 for improved function and mobility Goal status: MET 08/28/22  4.  Report pain < 3/10 with amb and standing activities for improved function Goal status: INITIAL  5.  Amb with LRAD modified independently for improved function and mobility Goal status: INITIAL    PLAN:  PT FREQUENCY: 2x/week  PT DURATION: 8 weeks  PLANNED INTERVENTIONS: Therapeutic exercises, Therapeutic activity, Neuromuscular re-education, Balance training, Gait training, Patient/Family education, Self Care, Joint mobilization, Stair training, DME instructions, Aquatic Therapy, Dry Needling, Electrical stimulation, Cryotherapy, Moist heat, Taping, Vasopneumatic device, Manual therapy, and Re-evaluation.  PLAN FOR NEXT SESSION: maximize flexion closed chain strengthening and balance as pt can tolerate   NEXT MD VISIT: 09/05/22    Clarita Crane, PT, DPT 09/04/22 10:58 AM

## 2022-09-05 ENCOUNTER — Encounter: Payer: Self-pay | Admitting: Orthopaedic Surgery

## 2022-09-05 ENCOUNTER — Ambulatory Visit (INDEPENDENT_AMBULATORY_CARE_PROVIDER_SITE_OTHER): Payer: 59 | Admitting: Orthopaedic Surgery

## 2022-09-05 DIAGNOSIS — Z96651 Presence of right artificial knee joint: Secondary | ICD-10-CM

## 2022-09-05 NOTE — Therapy (Signed)
OUTPATIENT PHYSICAL THERAPY TREATMENT NOTE    Patient Name: Victoria [name] MRN: [id number] DOB:1960-04-26, 62 y.o., female Today's Date: 09/06/2022  END OF SESSION:  PT End of Session - 09/06/22 1022     Visit Number 12    Number of Visits 16    Date for PT Re-Evaluation 09/25/22    Authorization Type [facility] [employer] $40 copay    PT Start Time 1015    PT Stop Time 1053    PT Time Calculation (min) 38 min    Activity Tolerance Patient limited by fatigue;No increased pain    Behavior During Therapy [facility] for tasks assessed/performed                       Past Medical History:  Diagnosis Date   Anxiety    Back pain    Bursitis    right shoulder   Chronic kidney disease    hematuria, has been worked up, her normal   Chronic leukopenia    intermittant since 2010, followed by Dr. Lenord Fellers now   Chronic neck pain    Constipation    Degenerative joint disease of low back 09/02/2015   Dr. Despina Hick   Displacement of lumbar intervertebral disc    Dysrhythmia    Tachycardia, PVCs, PACs   Edema of both lower legs    Fibrocystic breast    Gallbladder problem    GERD (gastroesophageal reflux disease)    Headache    per pt more stress/tension   Heart palpitations    SEE EPIC ENCOUTNER , CARDIOLOGY DR. Gloris Manchester TURNER 2018; reports on 05-16-17 " i haven't had any bouts of those lately"     Hemorrhoids    Hidradenitis suppurativa    History of Clostridium difficile infection 12/2010   History of exercise intolerance    ETT on 04-27-2016-- negative ([facility] treadmill score 7)   History of Helicobacter pylori infection 2001 and 09/ 2012   HSV-2 infection    genital   Hx of adenomatous colonic polyps 10/17/2007   Hydradenitis    per pt currently treated with Humira injection   Hypertension    Hyperthyroidism    Hypocupremia    Hypothyroidism    Hypothyroidism, postsurgical 1980   IBS (irritable bowel syndrome)    Insomnia    Iron deficiency    Joint pain     Leukopenia    Lumbosacral radiculopathy    Lymphocytic colitis 05/14/2022   dx from colonoscopy   Medial meniscus tear    right knee   Mild obstructive sleep apnea    Mitral regurgitation    Mild to moderate by echo 09/2021   Numbness and tingling of right arm    Obesity    Osteoarthritis    Palpitations    Pernicious anemia    b12 def   Peroneal neuropathy    PONV (postoperative nausea and vomiting)    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   Prediabetes    Reactive hypoglycemia followed by pcp   post gastrectomy dumping syndrome   SOB (shortness of breath)    Spinal stenosis    Tendonitis    right shoulder   Varicose veins    Vertigo    Vitamin B 12 deficiency    Vitamin D deficiency    Past Surgical History:  Procedure Laterality Date   BREATH TEK H PYLORI N/A 05/03/2014   Procedure: BREATH TEK H PYLORI;  Surgeon: Wenda Low, MD;  Location: [facility];  Service: General;  Laterality: N/A;   CARDIOVASCULAR STRESS TEST  03/11/2013   dr Gloris Manchester turner   Low risk nuclear study w/ a very small anterior perfusion defect that most likely represents breast attenuation artifact, less likely this could represent a true perfusion abnormality in the distribution of diagonal branch of the LAD/  normal LV function and wall motion , ef 66%   CATARACT EXTRACTION W/ INTRAOCULAR LENS  IMPLANT, BILATERAL  10/2017   CESAREAN SECTION  1985   CHOLECYSTECTOMY OPEN  1987   COLONOSCOPY  02/2017   Dr Loreta Ave ; per patient they found 7 polyps with polypectomy; on 5 year track    ESOPHAGOGASTRODUODENOSCOPY  02/01/2021   FINE NEEDLE ASPIRATION Left 05/22/2017   Procedure: LEFT KNEE ASPIRATION;  Surgeon: Ollen Gross, MD;  Location: [facility];  Service: Orthopedics;  Laterality: Left;   GASTRIC ROUX-EN-Y N/A 07/06/2014   Procedure: LAPAROSCOPIC ROUX-EN-Y GASTRIC BYPASS, ENTEROLYSIS OF ADHESIONS WITH UPPER ENDOSCOPY;  Surgeon: Luretha Murphy, MD;  Location: [facility];  Service: General;  Laterality:  N/A;   HAMMER TOE SURGERY Bilateral 02/17/2008   bilateral foot digit's 2 and 5   HIATAL HERNIA REPAIR N/A 07/06/2014   Procedure: LAPAROSCOPIC REPAIR OF HIATAL HERNIA;  Surgeon: Luretha Murphy, MD;  Location: [facility];  Service: General;  Laterality: N/A;   I & D KNEE WITH POLY EXCHANGE Left 12/11/2019   Procedure: LEFT KNEE POLY-LINER EXCHANGE;  Surgeon: Kathryne Hitch, MD;  Location: [facility];  Service: Orthopedics;  Laterality: Left;   KNEE ARTHROSCOPY Right 12/11/2019   Procedure: RIGHT KNEE ARTHROSCOPY WITH PARTIAL MEDIAL MENISCECTOMY;  Surgeon: Kathryne Hitch, MD;  Location: [facility];  Service: Orthopedics;  Laterality: Right;   KNEE ARTHROSCOPY WITH MEDIAL MENISECTOMY Left 07/17/2017   Procedure: LEFT KNEE ARTHROSCOPY WITH MEDIAL LATERAL MENISECTOMY;  Surgeon: Ollen Gross, MD;  Location: [facility];  Service: Orthopedics;  Laterality: Left;   MASS EXCISION N/A 10/11/2016   Procedure: EXCISION OF PERIANAL MASS;  Surgeon: Romie Levee, MD;  Location: [facility];  Service: General;  Laterality: N/A;   PARTIAL HYSTERECTOMY  2006   has ovaries   REFRACTIVE SURGERY     THYROIDECTOMY  1980   TOTAL ABDOMINAL HYSTERECTOMY  08-25-2002   dr Myrlene Broker   ovaries retained   TOTAL HIP ARTHROPLASTY Right 05/21/2012   Procedure: TOTAL HIP ARTHROPLASTY;  Surgeon: Loanne Drilling, MD;  Location: [facility];  Service: Orthopedics;  Laterality: Right;   TOTAL HIP ARTHROPLASTY Left 01-31-2009  dr Lequita Halt   TOTAL HIP REVISION Left 05/22/2017   Procedure: Left hip bearing surface and head revision;  Surgeon: Ollen Gross, MD;  Location: [facility];  Service: Orthopedics;  Laterality: Left;   TOTAL KNEE ARTHROPLASTY Left 01/20/2018   Procedure: LEFT TOTAL KNEE ARTHROPLASTY;  Surgeon: Ollen Gross, MD;  [facility];  Service: Orthopedics;  Laterality: Left;    TOTAL KNEE ARTHROPLASTY Right 07/13/2022   Procedure: RIGHTTOTAL KNEE ARTHROPLASTY;  Surgeon: Kathryne Hitch, MD;  Location: [facility];  Service: Orthopedics;  Laterality: Right;   TRANSTHORACIC ECHOCARDIOGRAM  05/03/2016   ef 55-60%/  trivial MR/  mild TR   TUBAL LIGATION     Patient Active Problem List   Diagnosis Date Noted   Polypharmacy 08/13/2022   Status post total right knee replacement 07/13/2022   Weight gain 07/12/2022   Lymphocytic colitis 07/12/2022   Migraine without aura and without status migrainosus, not intractable 07/02/2022   Chronic conjunctivitis of both eyes 06/28/2022  Generalized abdominal pain 05/24/2022   Chronic rhinitis 05/09/2022   Chronic migraine without aura without status migrainosus, not intractable 12/26/2021   Depressed mood 12/19/2021   Obstructive sleep apnea 12/19/2021   Other hypoglycemia-post bariatric 12/05/2021   Secondary hyperparathyroidism (HCC) 12/05/2021   Inadequate fluid intake 12/05/2021   Inadequate sleep hygiene 12/05/2021   Class 3 severe obesity with body mass index (BMI) of 40.0 to 44.9 in adult [facility]) 12/05/2021   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   SOBOE (shortness of breath on exertion) 11/21/2021   Other fatigue 11/21/2021   Vitamin D deficiency 11/21/2021   Iron deficiency 11/21/2021   Hypoglycemia after bariatric surgery 11/21/2021   Depression screening 11/21/2021   Class 3 severe obesity with serious comorbidity and body mass index (BMI) of 40.0 to 44.9 in adult (HCC) 11/21/2021   Mild obstructive sleep apnea 11/17/2021   Vertigo 11/17/2021   Excessive daytime sleepiness 10/23/2021   Spinal stenosis of lumbar region 07/07/2020   Displacement of lumbar intervertebral disc 06/02/2020   Lumbar spondylosis 06/02/2020   Lumbar stenosis with neurogenic claudication 06/02/2020   Arm numbness 05/30/2020   Chronic neck pain 05/30/2020   Other chronic pain 05/30/2020   Elevated blood-pressure reading, without diagnosis of hypertension 05/30/2020   Status post arthroscopy of right knee 12/11/2019   Polyethylene wear  of left knee joint prosthesis (HCC) 09/09/2019   Acute medial meniscal tear, right, subsequent encounter 08/27/2019   History of total left knee replacement 07/16/2019   Peroneal neuropathy at knee, left 06/25/2019   Lumbosacral radiculopathy 06/07/2019   Iron malabsorption 10/16/2018   IDA (iron deficiency anemia) 10/16/2018   OA (osteoarthritis) of knee 01/20/2018   Acute meniscal tear, medial 07/17/2017   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   Failed total hip arthroplasty, 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/2017   Pain in left knee 04/11/2017   Insomnia 11/08/2016   Pernicious anemia 11/08/2016   Leukopenia 07/09/2016   Hypocupremia 07/09/2016   Palpitations 04/12/2016   Shortness of breath 04/12/2016   Reactive hypoglycemia 10/31/2015   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/31/2015   Lap gastric bypass May 2016 07/06/2014   History of Clostridium difficile infection 08/12/2011   Post-surgical hypothyroidism 08/26/2010   Gastroesophageal reflux disease 08/26/2010   Vitamin D deficiency 08/26/2010   Fibrocystic disease of breast 08/26/2010   Cobalamin deficiency 08/26/2010   Morbid obesity (HCC) 03/20/2010   Backache 03/20/2010    PCP: Margaree Mackintosh, MD  REFERRING PROVIDER: Kathryne Hitch, MD  REFERRING DIAG: [phone] (ICD-10-CM) - Status post total right knee replacement  Rationale for Evaluation and Treatment: Rehabilitation  THERAPY DIAG:  Acute pain of right knee  Stiffness of right knee, not elsewhere classified  Muscle weakness (generalized)  Other abnormalities of gait and mobility  Localized edema  ONSET DATE: 07/13/22 (DOS)   SUBJECTIVE:  SUBJECTIVE STATEMENT: Pt states that she is a little sore after putting some furniture together this morning. Had her MD appointment  yesterday and everything looked good.   PERTINENT HISTORY:  PMH includes: obesity, gastric bypass, L THA revision, L TKA, OSA, spinal stenosis  PAIN:  Are you having pain? Yes: NPRS scale: 6 currently, /10 Pain location: Rt knee Pain description: throbbing, aching, occasional shocks Aggravating factors: end ranges; increased activity Relieving factors: repositioning, icing  PRECAUTIONS:  None  WEIGHT BEARING RESTRICTIONS:  No  FALLS:  Has patient fallen in last 6 months? No  LIVING ENVIRONMENT: Lives with: lives with their spouse Lives in: House/apartment Stairs: Yes: Internal: flight steps; has stair lift and External: 5 steps; can reach both (2 steps in garage; no railing) Has following equipment at home: Retail banker - 2 wheeled  OCCUPATION:  Charity fundraiser at [employer]  PLOF:  Independent and Leisure: walking in the park  PATIENT GOALS:  Regain use of RLE, improve motion in knee   OBJECTIVE: (objective measures completed at initial evaluation unless otherwise dated)   PATIENT SURVEYS:  07/31/22: FOTO 34 (predicted 51) 09/04/22: FOTO 52  COGNITIVE STATUS: Within functional limits for tasks assessed   RED FLAGS: None    SENSATION: 07/31/22: Not tested  POSTURE:  rounded shoulders and forward head   GAIT: 07/31/22 Distance walked: 100' in clinic Assistive device utilized: Environmental consultant - 2 wheeled Level of assistance: Modified independence Comments: antalgic gait with decreased stance on Rt , decreased hip/knee flexion   Knee  LOWER EXTREMITY ROM:     ROM Right eval Left eval Right LE ROM 08/03/22 Right 08/06/22 Right 08/20/22 Right 08/28/22 Right 09/04/22 Right 09/06/22  Knee flexion A: 65 P:71 A: 100 Seated edge of mat, post manual  A: 79 deg P: 82 deg  A: 77 P: 86 A: 99 P: 100 A: 107 P: 111 A: 105 A:107 P:110  Knee extension A: -2 (seated LAQ) P: 0 A: 4 (hyper) 0 deg A/PROM with mild pain at end range A: 0  (seated LAQ) A: 0  (seated LAQ) A:  0  (seated LAQ) A: 0  (seated LAQ)    (Blank rows = not tested)   LOWER EXTREMITY MMT:    MMT Right eval Left eval  Knee flexion 3-/5   Knee extension 3-/5    (Blank rows = not tested)   TREATMENT:                                                                                                                              DATE:   09/06/22 Recumbent bike Seat 11: x5 min partial to full revolutions Tandem on foam pad, 5x10 sec each LE forward Heel raises weight shifted Rt x15 reps Leg press  BLE #87 3x10, Rt only #62 2x10 Supine Rt SLR 2x10 reps Supine AA heel slides x10 reps Seated Rt knee flexion AA 3x10 sec hold  Scar massage  at home  09/04/22 TherEx SciFit bike seat 12; L4 x 8 min ROM measurements - see above AA heel slides x 10 reps Seated Rt LAQ with 3 sec hold; 3x10; 5# weight Sit to/from stand 2x10 reps BATCA knee extension 5# RLE only 3x10 BATCA hamstring curl 10# RLE only 3x10 Incline board calf stretch 3x30 sec   08/31/22 TherEx SciFit bike seat 12; L4 x 8 min Leg press DL 16# 1W96, then Rt leg only 43# 3X10 Standing hip extension and abduction 3x10 each; L3 band Seated LAQ 5# 3x10 Sit to/from stand 2 x 10 reps without UE support (from elevated mat table)  08/28/22 TherEx SciFit bike seat 12; L4 x 8 min Leg press DL 04# 5W09, then Rt leg only 37# 3X10 Forward step up onto 6" step 2x10 leading with RLE Standing hip extension and abduction 2x10 each; L3 band Seated LAQ 5# 3x10 Supine AA heel slides 2x10; 3 sec hold ROM measurements - see above for details Bridges x10; 5 sec hold Hooklying single limb clamshell 2x10 bil; L4 band   08/22/22 TherEx SciFit bike seat 15; L3 x 8 min Seated LAQ 4# 3x10; 3 sec hold at end ranges on Rt Seated SLR 2x10; working on maintaining knee extension Seated hamstring stretch 3x30 sec on Rt  08/20/22 TherEx SciFit bike seat 15; able to progress to full revolutions x 8 min Leg press DL 81# 1B14, then Rt leg only 31#  3X10 Forward step up onto 6" step 2x10 leading with RLE Lateral step ups onto 6" step 2x10 leading with RLE Seated LAQ 4# 3x10; 3 sec hold at end ranges on Rt AA heel slides x 10 reps ROM measurements - see above for details Standing hip extension and abduction 2x10 each; L3 band    PATIENT EDUCATION:  Education details: technique with therex; scar mobilization at home Person educated: Patient Education method: Explanation, Demonstration, and Handouts Education comprehension: verbalized understanding, returned demonstration, and needs further education  HOME EXERCISE PROGRAM: Access Code: [email or web address] URL: [email or web address] Date: 08/20/2022 Prepared by: Moshe Cipro  Exercises - Seated Knee Flexion AAROM  - 5-10 x daily - 7 x weekly - 1 sets - 5-10 reps - 5 sec hold - Supine Heel Slide with Strap  - 5-10 x daily - 7 x weekly - 1 sets - 5-10 reps - Quad Set  - 5-10 x daily - 7 x weekly - 1 sets - 5-10 reps - 5 sec hold - Seated Straight Leg Raise  - 2-3 x daily - 7 x weekly - 1-2 sets - 10 reps - Sit to Stand  - 2 x daily - 7 x weekly - 1 sets - 10 reps - Standing Hip Abduction with Resistance at Ankles and Counter Support  - 1 x daily - 7 x weekly - 2 sets - 10 reps - Standing Hip Extension with Resistance at Ankles and Counter Support  - 1 x daily - 7 x weekly - 2 sets - 10 reps   ASSESSMENT:  CLINICAL IMPRESSION: Pt had some increased Rt LE swelling following heavy activity around her home this morning. Pt was able to complete recumbent bike for several full revolutions at the start of today's session. Also increased single leg press resistance without pain or significant difficulty. Focused on progressing LE strength and ROM. Pt's knee flexion ROM is around 110 degrees and PT reviewed AA knee flexion stretch for better understanding and completion at home. Pt denied overall increase in knee pain.   Per  eval - Patient is a 62 y.o. female who was seen today  for physical therapy evaluation and treatment for Rt TKA on 07/13/22. She demonstrates decreased strength ROM and balance, as well as gait abnormalities and expected post op pain and swelling affecting functional mobility.  She will benefit from PT to address deficits listed.    OBJECTIVE IMPAIRMENTS: Abnormal gait, decreased activity tolerance, decreased balance, decreased knowledge of use of DME, decreased mobility, difficulty walking, decreased ROM, decreased strength, hypomobility, increased fascial restrictions, increased muscle spasms, and pain.   ACTIVITY LIMITATIONS: carrying, lifting, bending, sitting, standing, squatting, sleeping, stairs, transfers, and locomotion level  PARTICIPATION LIMITATIONS: meal prep, cleaning, laundry, driving, shopping, community activity, occupation, and yard work  PERSONAL FACTORS: 3+ comorbidities: obesity, gastric bypass, L THA revision, L TKA, OSA, spinal stenosis  are also affecting patient's functional outcome.   REHAB POTENTIAL: Good  CLINICAL DECISION MAKING: Evolving/moderate complexity  EVALUATION COMPLEXITY: Moderate   GOALS: Goals reviewed with patient? Yes  SHORT TERM GOALS: Target date: 08/28/2022  Independent with initial HEP Goal status: MET 08/20/22  2.  Rt knee AROM improved 0-90 deg for improved function Goal status: MET 08/20/22   LONG TERM GOALS: Target date: 09/25/2022  Independent with final HEP Goal status: INITIAL  2.  FOTO score improved to 51 Goal status: MET 09/04/22  3.  Rt knee AROM improved to 0-100 for improved function and mobility Goal status: MET 08/28/22  4.  Report pain < 3/10 with amb and standing activities for improved function Goal status: INITIAL  5.  Amb with LRAD modified independently for improved function and mobility Goal status: INITIAL    PLAN:  PT FREQUENCY: 2x/week  PT DURATION: 8 weeks  PLANNED INTERVENTIONS: Therapeutic exercises, Therapeutic activity, Neuromuscular re-education,  Balance training, Gait training, Patient/Family education, Self Care, Joint mobilization, Stair training, DME instructions, Aquatic Therapy, Dry Needling, Electrical stimulation, Cryotherapy, Moist heat, Taping, Vasopneumatic device, Manual therapy, and Re-evaluation.  PLAN FOR NEXT SESSION: maximize flexion closed chain strengthening and balance as pt can tolerate   NEXT MD VISIT: 09/05/22   10:58 AM,09/06/22 Donita Brooks PT, DPT [facility]  [phone]

## 2022-09-05 NOTE — Progress Notes (Signed)
The patient is now 7 weeks status post a right total knee arthroplasty.  She says she has good days and bad days but overall is doing well.  She has been pushing herself or through physical therapy.  She states they have been able to flex her past 95 degrees to least 101.  Her extension is also excellent.  She is walking without assistive ice.  She is an active 62 year old female.  On exam her extension is full and her flexion is to past 90 degrees today.  Incision looks good overall.  Her calf is soft and her swelling is minimal.  The knee feels ligamentously stable.  Will see her back for another visit in 4 weeks from now but no x-rays are needed.  At that visit we can give her a note in terms of return to work.  Likely she will need to only start at half days when she returns for at least the first 2 to 4 weeks depending on how she is doing overall.  We can see what she thinks about that in 4 weeks as well.

## 2022-09-06 ENCOUNTER — Ambulatory Visit (INDEPENDENT_AMBULATORY_CARE_PROVIDER_SITE_OTHER): Payer: 59 | Admitting: Physical Therapy

## 2022-09-06 ENCOUNTER — Encounter: Payer: Self-pay | Admitting: Physical Therapy

## 2022-09-06 DIAGNOSIS — M25561 Pain in right knee: Secondary | ICD-10-CM

## 2022-09-06 DIAGNOSIS — R600 Localized edema: Secondary | ICD-10-CM | POA: Diagnosis not present

## 2022-09-06 DIAGNOSIS — M25661 Stiffness of right knee, not elsewhere classified: Secondary | ICD-10-CM | POA: Diagnosis not present

## 2022-09-06 DIAGNOSIS — R2689 Other abnormalities of gait and mobility: Secondary | ICD-10-CM

## 2022-09-06 DIAGNOSIS — M6281 Muscle weakness (generalized): Secondary | ICD-10-CM

## 2022-09-07 ENCOUNTER — Other Ambulatory Visit (HOSPITAL_COMMUNITY): Payer: Self-pay

## 2022-09-10 ENCOUNTER — Encounter: Payer: Self-pay | Admitting: Physical Therapy

## 2022-09-10 ENCOUNTER — Ambulatory Visit (INDEPENDENT_AMBULATORY_CARE_PROVIDER_SITE_OTHER): Payer: 59 | Admitting: Physical Therapy

## 2022-09-10 DIAGNOSIS — M25661 Stiffness of right knee, not elsewhere classified: Secondary | ICD-10-CM | POA: Diagnosis not present

## 2022-09-10 DIAGNOSIS — M25561 Pain in right knee: Secondary | ICD-10-CM | POA: Diagnosis not present

## 2022-09-10 DIAGNOSIS — M6281 Muscle weakness (generalized): Secondary | ICD-10-CM | POA: Diagnosis not present

## 2022-09-10 DIAGNOSIS — R600 Localized edema: Secondary | ICD-10-CM

## 2022-09-10 DIAGNOSIS — R2689 Other abnormalities of gait and mobility: Secondary | ICD-10-CM | POA: Diagnosis not present

## 2022-09-10 NOTE — Therapy (Signed)
OUTPATIENT PHYSICAL THERAPY TREATMENT NOTE    Patient Name: Victoria [name] MRN: [id number] DOB:[birth year], 62 y.o., female Today's Date: 09/10/2022  END OF SESSION:  PT End of Session - 09/10/22 0931     Visit Number 13    Number of Visits 16    Date for PT Re-Evaluation 09/25/22    Authorization Type [facility] [employer] $40 copay    PT Start Time 0927    PT Stop Time 1005    PT Time Calculation (min) 38 min    Activity Tolerance Patient limited by fatigue;No increased pain    Behavior During Therapy [facility] for tasks assessed/performed                        Past Medical History:  Diagnosis Date   Anxiety    Back pain    Bursitis    right shoulder   Chronic kidney disease    hematuria, has been worked up, her normal   Chronic leukopenia    intermittant since 2010, followed by Dr. Lenord Fellers now   Chronic neck pain    Constipation    Degenerative joint disease of low back 09/02/2015   Dr. Despina Hick   Displacement of lumbar intervertebral disc    Dysrhythmia    Tachycardia, PVCs, PACs   Edema of both lower legs    Fibrocystic breast    Gallbladder problem    GERD (gastroesophageal reflux disease)    Headache    per pt more stress/tension   Heart palpitations    SEE EPIC ENCOUTNER , CARDIOLOGY DR. Gloris Manchester TURNER 2018; reports on 05-16-17 " i haven't had any bouts of those lately"     Hemorrhoids    Hidradenitis suppurativa    History of Clostridium difficile infection 12/2010   History of exercise intolerance    ETT on 04-27-2016-- negative ([facility] treadmill score 7)   History of Helicobacter pylori infection 2001 and 09/ 2012   HSV-2 infection    genital   Hx of adenomatous colonic polyps 10/17/2007   Hydradenitis    per pt currently treated with Humira injection   Hypertension    Hyperthyroidism    Hypocupremia    Hypothyroidism    Hypothyroidism, postsurgical 1980   IBS (irritable bowel syndrome)    Insomnia    Iron deficiency    Joint pain     Leukopenia    Lumbosacral radiculopathy    Lymphocytic colitis 05/14/2022   dx from colonoscopy   Medial meniscus tear    right knee   Mild obstructive sleep apnea    Mitral regurgitation    Mild to moderate by echo 09/2021   Numbness and tingling of right arm    Obesity    Osteoarthritis    Palpitations    Pernicious anemia    b12 def   Peroneal neuropathy    PONV (postoperative nausea and vomiting)    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   Prediabetes    Reactive hypoglycemia followed by pcp   post gastrectomy dumping syndrome   SOB (shortness of breath)    Spinal stenosis    Tendonitis    right shoulder   Varicose veins    Vertigo    Vitamin B 12 deficiency    Vitamin D deficiency    Past Surgical History:  Procedure Laterality Date   BREATH TEK H PYLORI N/A 05/03/2014   Procedure: BREATH TEK H PYLORI;  Surgeon: Wenda Low, MD;  Location: [facility];  Service: General;  Laterality: N/A;   CARDIOVASCULAR STRESS TEST  03/11/2013   dr Gloris Manchester turner   Low risk nuclear study w/ a very small anterior perfusion defect that most likely represents breast attenuation artifact, less likely this could represent a true perfusion abnormality in the distribution of diagonal branch of the LAD/  normal LV function and wall motion , ef 66%   CATARACT EXTRACTION W/ INTRAOCULAR LENS  IMPLANT, BILATERAL  10/2017   CESAREAN SECTION  1985   CHOLECYSTECTOMY OPEN  1987   COLONOSCOPY  02/2017   Dr Loreta Ave ; per patient they found 7 polyps with polypectomy; on 5 year track    ESOPHAGOGASTRODUODENOSCOPY  02/01/2021   FINE NEEDLE ASPIRATION Left 05/22/2017   Procedure: LEFT KNEE ASPIRATION;  Surgeon: Ollen Gross, MD;  Location: [facility];  Service: Orthopedics;  Laterality: Left;   GASTRIC ROUX-EN-Y N/A 07/06/2014   Procedure: LAPAROSCOPIC ROUX-EN-Y GASTRIC BYPASS, ENTEROLYSIS OF ADHESIONS WITH UPPER ENDOSCOPY;  Surgeon: Luretha Murphy, MD;  Location: [facility];  Service: General;  Laterality:  N/A;   HAMMER TOE SURGERY Bilateral 02/17/2008   bilateral foot digit's 2 and 5   HIATAL HERNIA REPAIR N/A 07/06/2014   Procedure: LAPAROSCOPIC REPAIR OF HIATAL HERNIA;  Surgeon: Luretha Murphy, MD;  Location: [facility];  Service: General;  Laterality: N/A;   I & D KNEE WITH POLY EXCHANGE Left 12/11/2019   Procedure: LEFT KNEE POLY-LINER EXCHANGE;  Surgeon: Kathryne Hitch, MD;  Location: [facility];  Service: Orthopedics;  Laterality: Left;   KNEE ARTHROSCOPY Right 12/11/2019   Procedure: RIGHT KNEE ARTHROSCOPY WITH PARTIAL MEDIAL MENISCECTOMY;  Surgeon: Kathryne Hitch, MD;  Location: [facility];  Service: Orthopedics;  Laterality: Right;   KNEE ARTHROSCOPY WITH MEDIAL MENISECTOMY Left 07/17/2017   Procedure: LEFT KNEE ARTHROSCOPY WITH MEDIAL LATERAL MENISECTOMY;  Surgeon: Ollen Gross, MD;  Location: [facility];  Service: Orthopedics;  Laterality: Left;   MASS EXCISION N/A 10/11/2016   Procedure: EXCISION OF PERIANAL MASS;  Surgeon: Romie Levee, MD;  Location[facility];  Service: General;  Laterality: N/A;   PARTIAL HYSTERECTOMY  2006   has ovaries   REFRACTIVE SURGERY     THYROIDECTOMY  1980   TOTAL ABDOMINAL HYSTERECTOMY  08-25-2002   dr Myrlene Broker   ovaries retained   TOTAL HIP ARTHROPLASTY Right 05/21/2012   Procedure: TOTAL HIP ARTHROPLASTY;  Surgeon: Loanne Drilling, MD;  Location: [facility];  Service: Orthopedics;  Laterality: Right;   TOTAL HIP ARTHROPLASTY Left 01-31-2009  dr Lequita Halt   TOTAL HIP REVISION Left 05/22/2017   Procedure: Left hip bearing surface and head revision;  Surgeon: Ollen Gross, MD;  Location: [facility];  Service: Orthopedics;  Laterality: Left;   TOTAL KNEE ARTHROPLASTY Left 01/20/2018   Procedure: LEFT TOTAL KNEE ARTHROPLASTY;  Surgeon: Ollen Gross, MD;  Location: [facility];  Service: Orthopedics;  Laterality: Left;    TOTAL KNEE ARTHROPLASTY Right 07/13/2022   Procedure: RIGHTTOTAL KNEE ARTHROPLASTY;  Surgeon: Kathryne Hitch, MD;  Location: [facility];  Service: Orthopedics;  Laterality: Right;   TRANSTHORACIC ECHOCARDIOGRAM  05/03/2016   ef 55-60%/  trivial MR/  mild TR   TUBAL LIGATION     Patient Active Problem List   Diagnosis Date Noted   Polypharmacy 08/13/2022   Status post total right knee replacement 07/13/2022   Weight gain 07/12/2022   Lymphocytic colitis 07/12/2022   Migraine without aura and without status migrainosus, not intractable 07/02/2022   Chronic conjunctivitis of both eyes 06/28/2022  Generalized abdominal pain 05/24/2022   Chronic rhinitis 05/09/2022   Chronic migraine without aura without status migrainosus, not intractable 12/26/2021   Depressed mood 12/19/2021   Obstructive sleep apnea 12/19/2021   Other hypoglycemia-post bariatric 12/05/2021   Secondary hyperparathyroidism (HCC) 12/05/2021   Inadequate fluid intake 12/05/2021   Inadequate sleep hygiene 12/05/2021   Class 3 severe obesity with body mass index (BMI) of 40.0 to 44.9 in adult [facility]) 12/05/2021   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   SOBOE (shortness of breath on exertion) 11/21/2021   Other fatigue 11/21/2021   Vitamin D deficiency 11/21/2021   Iron deficiency 11/21/2021   Hypoglycemia after bariatric surgery 11/21/2021   Depression screening 11/21/2021   Class 3 severe obesity with serious comorbidity and body mass index (BMI) of 40.0 to 44.9 in adult (HCC) 11/21/2021   Mild obstructive sleep apnea 11/17/2021   Vertigo 11/17/2021   Excessive daytime sleepiness 10/23/2021   Spinal stenosis of lumbar region 07/07/2020   Displacement of lumbar intervertebral disc 06/02/2020   Lumbar spondylosis 06/02/2020   Lumbar stenosis with neurogenic claudication 06/02/2020   Arm numbness 05/30/2020   Chronic neck pain 05/30/2020   Other chronic pain 05/30/2020   Elevated blood-pressure reading, without diagnosis of hypertension 05/30/2020   Status post arthroscopy of right knee 12/11/2019   Polyethylene wear  of left knee joint prosthesis (HCC) 09/09/2019   Acute medial meniscal tear, right, subsequent encounter 08/27/2019   History of total left knee replacement 07/16/2019   Peroneal neuropathy at knee, left 06/25/2019   Lumbosacral radiculopathy 06/07/2019   Iron malabsorption 10/16/2018   IDA (iron deficiency anemia) 10/16/2018   OA (osteoarthritis) of knee 01/20/2018   Acute meniscal tear, medial 07/17/2017   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   Failed total hip arthroplasty, 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/2017   Pain in left knee 04/11/2017   Insomnia 11/08/2016   Pernicious anemia 11/08/2016   Leukopenia 07/09/2016   Hypocupremia 07/09/2016   Palpitations 04/12/2016   Shortness of breath 04/12/2016   Reactive hypoglycemia 10/31/2015   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/31/2015   Lap gastric bypass May 2016 07/06/2014   History of Clostridium difficile infection 08/12/2011   Post-surgical hypothyroidism 08/26/2010   Gastroesophageal reflux disease 08/26/2010   Vitamin D deficiency 08/26/2010   Fibrocystic disease of breast 08/26/2010   Cobalamin deficiency 08/26/2010   Morbid obesity (HCC) 03/20/2010   Backache 03/20/2010    PCP: Margaree Mackintosh, MD  REFERRING PROVIDER: Kathryne Hitch, MD  REFERRING DIAG: [phone] (ICD-10-CM) - Status post total right knee replacement  Rationale for Evaluation and Treatment: Rehabilitation  THERAPY DIAG:  Acute pain of right knee  Stiffness of right knee, not elsewhere classified  Muscle weakness (generalized)  Other abnormalities of gait and mobility  Localized edema  ONSET DATE: 07/13/22 (DOS)   SUBJECTIVE:  SUBJECTIVE STATEMENT: Having more pain recently; trying to be more active.  Thinks her ankle is more swollen  PERTINENT HISTORY:  PMH  includes: obesity, gastric bypass, L THA revision, L TKA, OSA, spinal stenosis  PAIN:  Are you having pain? Yes: NPRS scale: 5 currently /10 Pain location: Rt knee Pain description: throbbing, aching, occasional shocks Aggravating factors: end ranges; increased activity Relieving factors: repositioning, icing  PRECAUTIONS:  None  WEIGHT BEARING RESTRICTIONS:  No  FALLS:  Has patient fallen in last 6 months? No  LIVING ENVIRONMENT: Lives with: lives with their spouse Lives in: House/apartment Stairs: Yes: Internal: flight steps; has stair lift and External: 5 steps; can reach both (2 steps in garage; no railing) Has following equipment at home: Retail banker - 2 wheeled  OCCUPATION:  Charity fundraiser at [employer]  PLOF:  Independent and Leisure: walking in the park  PATIENT GOALS:  Regain use of RLE, improve motion in knee   OBJECTIVE: (objective measures completed at initial evaluation unless otherwise dated)   PATIENT SURVEYS:  07/31/22: FOTO 34 (predicted 51) 09/04/22: FOTO 52  COGNITIVE STATUS: Within functional limits for tasks assessed   RED FLAGS: None    SENSATION: 07/31/22: Not tested  POSTURE:  rounded shoulders and forward head   GAIT: 07/31/22 Distance walked: 100' in clinic Assistive device utilized: Environmental consultant - 2 wheeled Level of assistance: Modified independence Comments: antalgic gait with decreased stance on Rt , decreased hip/knee flexion   Knee  LOWER EXTREMITY ROM:     ROM Right eval Left eval Right LE ROM 08/03/22 Right 08/06/22 Right 08/20/22 Right 08/28/22 Right 09/04/22 Right 09/06/22  Knee flexion A: 65 P:71 A: 100 Seated edge of mat, post manual  A: 79 deg P: 82 deg  A: 77 P: 86 A: 99 P: 100 A: 107 P: 111 A: 105 A:107 P:110  Knee extension A: -2 (seated LAQ) P: 0 A: 4 (hyper) 0 deg A/PROM with mild pain at end range A: 0  (seated LAQ) A: 0  (seated LAQ) A: 0  (seated LAQ) A: 0  (seated LAQ)    (Blank rows = not tested)    LOWER EXTREMITY MMT:    MMT Right eval Left eval  Knee flexion 3-/5   Knee extension 3-/5    (Blank rows = not tested)   TREATMENT:                                                                                                                              DATE:  09/10/22 TherEx SciFit bike seat 11; L4 x 8 min BATCA knee extension 5# RLE only 3x10 BATCA hamstring curl 10# RLE only 3x10 Leg press  BLE #87 4x10, Rt only #62 3x10  Neuro Re-Ed Sidestepping on foam beam in // bars x 5 laps; intermittent UE support needed Tandem walking with light UE support x 4 laps in // bars; UE support needed   09/06/22  Recumbent bike Seat 11: x5 min partial to full revolutions Tandem on foam pad, 5x10 sec each LE forward Heel raises weight shifted Rt x15 reps Leg press  BLE #87 3x10, Rt only #62 2x10 Supine Rt SLR 2x10 reps Supine AA heel slides x10 reps Seated Rt knee flexion AA 3x10 sec hold  Scar massage at home    09/04/22 TherEx SciFit bike seat 12; L4 x 8 min ROM measurements - see above AA heel slides x 10 reps Seated Rt LAQ with 3 sec hold; 3x10; 5# weight Sit to/from stand 2x10 reps BATCA knee extension 5# RLE only 3x10 BATCA hamstring curl 10# RLE only 3x10 Incline board calf stretch 3x30 sec   08/31/22 TherEx SciFit bike seat 12; L4 x 8 min Leg press DL 32# [id number], then Rt leg only 43# 3X10 Standing hip extension and abduction 3x10 each; L3 band Seated LAQ 5# 3x10 Sit to/from stand 2 x 10 reps without UE support (from elevated mat table) PATIENT EDUCATION:  Education details: technique with therex; scar mobilization at home Person educated: Patient Education method: Explanation, Demonstration, and Handouts Education comprehension: verbalized understanding, returned demonstration, and needs further education  HOME EXERCISE PROGRAM: Access Code: [email or web address] URL: [email or web address] Date: 08/20/2022 Prepared by: Moshe Cipro  Exercises - Seated  Knee Flexion AAROM  - 5-10 x daily - 7 x weekly - 1 sets - 5-10 reps - 5 sec hold - Supine Heel Slide with Strap  - 5-10 x daily - 7 x weekly - 1 sets - 5-10 reps - Quad Set  - 5-10 x daily - 7 x weekly - 1 sets - 5-10 reps - 5 sec hold - Seated Straight Leg Raise  - 2-3 x daily - 7 x weekly - 1-2 sets - 10 reps - Sit to Stand  - 2 x daily - 7 x weekly - 1 sets - 10 reps - Standing Hip Abduction with Resistance at Ankles and Counter Support  - 1 x daily - 7 x weekly - 2 sets - 10 reps - Standing Hip Extension with Resistance at Ankles and Counter Support  - 1 x daily - 7 x weekly - 2 sets - 10 reps   ASSESSMENT:  CLINICAL IMPRESSION: Pt tolerated session well today but does have difficulty with balance activities especially on compliant surface.  Will continue to benefit from PT to maximize function.  Per eval - Patient is a 62 y.o. female who was seen today for physical therapy evaluation and treatment for Rt TKA on 07/13/22. She demonstrates decreased strength ROM and balance, as well as gait abnormalities and expected post op pain and swelling affecting functional mobility.  She will benefit from PT to address deficits listed.    OBJECTIVE IMPAIRMENTS: Abnormal gait, decreased activity tolerance, decreased balance, decreased knowledge of use of DME, decreased mobility, difficulty walking, decreased ROM, decreased strength, hypomobility, increased fascial restrictions, increased muscle spasms, and pain.   ACTIVITY LIMITATIONS: carrying, lifting, bending, sitting, standing, squatting, sleeping, stairs, transfers, and locomotion level  PARTICIPATION LIMITATIONS: meal prep, cleaning, laundry, driving, shopping, community activity, occupation, and yard work  PERSONAL FACTORS: 3+ comorbidities: obesity, gastric bypass, L THA revision, L TKA, OSA, spinal stenosis  are also affecting patient's functional outcome.   REHAB POTENTIAL: Good  CLINICAL DECISION MAKING: Evolving/moderate  complexity  EVALUATION COMPLEXITY: Moderate   GOALS: Goals reviewed with patient? Yes  SHORT TERM GOALS: Target date: 08/28/2022  Independent with initial HEP Goal status: MET 08/20/22  2.  Rt knee AROM  improved 0-90 deg for improved function Goal status: MET 08/20/22   LONG TERM GOALS: Target date: 09/25/2022  Independent with final HEP Goal status: INITIAL  2.  FOTO score improved to 51 Goal status: MET 09/04/22  3.  Rt knee AROM improved to 0-100 for improved function and mobility Goal status: MET 08/28/22  4.  Report pain < 3/10 with amb and standing activities for improved function Goal status: INITIAL  5.  Amb with LRAD modified independently for improved function and mobility Goal status: INITIAL    PLAN:  PT FREQUENCY: 2x/week  PT DURATION: 8 weeks  PLANNED INTERVENTIONS: Therapeutic exercises, Therapeutic activity, Neuromuscular re-education, Balance training, Gait training, Patient/Family education, Self Care, Joint mobilization, Stair training, DME instructions, Aquatic Therapy, Dry Needling, Electrical stimulation, Cryotherapy, Moist heat, Taping, Vasopneumatic device, Manual therapy, and Re-evaluation.  PLAN FOR NEXT SESSION: continue to maximize flexion closed chain strengthening and balance as pt can tolerate   NEXT MD VISIT: 10/03/22   Clarita Crane, PT, DPT 09/10/22 10:09 AM

## 2022-09-12 ENCOUNTER — Ambulatory Visit (INDEPENDENT_AMBULATORY_CARE_PROVIDER_SITE_OTHER): Payer: 59 | Admitting: Physical Therapy

## 2022-09-12 ENCOUNTER — Encounter: Payer: Self-pay | Admitting: Physical Therapy

## 2022-09-12 ENCOUNTER — Other Ambulatory Visit (HOSPITAL_COMMUNITY): Payer: Self-pay

## 2022-09-12 DIAGNOSIS — R2689 Other abnormalities of gait and mobility: Secondary | ICD-10-CM | POA: Diagnosis not present

## 2022-09-12 DIAGNOSIS — M6281 Muscle weakness (generalized): Secondary | ICD-10-CM | POA: Diagnosis not present

## 2022-09-12 DIAGNOSIS — M25661 Stiffness of right knee, not elsewhere classified: Secondary | ICD-10-CM | POA: Diagnosis not present

## 2022-09-12 DIAGNOSIS — R600 Localized edema: Secondary | ICD-10-CM

## 2022-09-12 DIAGNOSIS — M25561 Pain in right knee: Secondary | ICD-10-CM

## 2022-09-12 DIAGNOSIS — F5101 Primary insomnia: Secondary | ICD-10-CM

## 2022-09-12 LAB — CBC WITH DIFFERENTIAL/PLATELET
Basophils Absolute: 0 10*3/uL (ref 0.0–0.2)
Basos: 1 %
EOS (ABSOLUTE): 0.1 10*3/uL (ref 0.0–0.4)
Eos: 2 %
Hematocrit: 37.2 % (ref 34.0–46.6)
Hemoglobin: 12.2 g/dL (ref 11.1–15.9)
Immature Grans (Abs): 0 10*3/uL (ref 0.0–0.1)
Immature Granulocytes: 0 %
Lymphocytes Absolute: 1.3 10*3/uL (ref 0.7–3.1)
Lymphs: 30 %
MCH: 31.9 pg (ref 26.6–33.0)
MCHC: 32.8 g/dL (ref 31.5–35.7)
MCV: 97 fL (ref 79–97)
Monocytes Absolute: 0.3 10*3/uL (ref 0.1–0.9)
Monocytes: 7 %
Neutrophils Absolute: 2.8 10*3/uL (ref 1.4–7.0)
Neutrophils: 60 %
Platelets: 209 10*3/uL (ref 150–450)
RBC: 3.82 x10E6/uL (ref 3.77–5.28)
RDW: 13 % (ref 11.7–15.4)
WBC: 4.5 10*3/uL (ref 3.4–10.8)

## 2022-09-12 LAB — COMPREHENSIVE METABOLIC PANEL
ALT: 17 IU/L (ref 0–32)
AST: 15 IU/L (ref 0–40)
Albumin: 4.2 g/dL (ref 3.9–4.9)
Alkaline Phosphatase: 67 IU/L (ref 44–121)
BUN/Creatinine Ratio: 9 — ABNORMAL LOW (ref 12–28)
BUN: 9 mg/dL (ref 8–27)
Bilirubin Total: 0.2 mg/dL (ref 0.0–1.2)
CO2: 24 mmol/L (ref 20–29)
Calcium: 9.2 mg/dL (ref 8.7–10.3)
Chloride: 104 mmol/L (ref 96–106)
Creatinine, Ser: 0.95 mg/dL (ref 0.57–1.00)
Globulin, Total: 3.6 g/dL (ref 1.5–4.5)
Glucose: 91 mg/dL (ref 70–99)
Potassium: 3.7 mmol/L (ref 3.5–5.2)
Sodium: 144 mmol/L (ref 134–144)
Total Protein: 7.8 g/dL (ref 6.0–8.5)
eGFR: 68 mL/min/{1.73_m2} (ref >59)

## 2022-09-12 LAB — C1 ESTERASE INHIBITOR, FUNCTIONAL: C1INH Functional/C1INH Total MFr SerPl: 106 %mean normal

## 2022-09-12 LAB — ALPHA-GAL PANEL
Allergen Lamb IgE: <0.1 kU/L
Beef IgE: <0.1 kU/L
IgE (Immunoglobulin E), Serum: 77 IU/mL (ref 6–495)
O215-IgE Alpha-Gal: <0.1 kU/L
Pork IgE: <0.1 kU/L

## 2022-09-12 LAB — COMPLEMENT COMPONENT C1Q: Complement C1Q: 12.5 mg/dL (ref 10.3–20.5)

## 2022-09-12 LAB — TRYPTASE: Tryptase: 6.9 ug/L (ref 2.2–13.2)

## 2022-09-12 LAB — C3 AND C4
Complement C3, Serum: 156 mg/dL (ref 82–167)
Complement C4, Serum: 17 mg/dL (ref 12–38)

## 2022-09-12 LAB — C1 ESTERASE INHIBITOR: C1INH SerPl-mCnc: 35 mg/dL (ref 21–39)

## 2022-09-12 NOTE — Therapy (Signed)
OUTPATIENT PHYSICAL THERAPY TREATMENT NOTE  DISCHARGE SUMMARY   Patient Name: Victoria [name] MRN: [id number] DOB:[birth year], 62 y.o., female Today's Date: 09/12/2022  END OF SESSION:  PT End of Session - 09/12/22 0927     Visit Number 14    Number of Visits --    Date for PT Re-Evaluation --    Authorization Type [facility] [employer] $40 copay    PT Start Time [phone]    PT Stop Time 0945   pt requested to end session early   PT Time Calculation (min) 20 min    Activity Tolerance Patient limited by fatigue;No increased pain    Behavior During Therapy [facility] for tasks assessed/performed                         Past Medical History:  Diagnosis Date   Anxiety    Back pain    Bursitis    right shoulder   Chronic kidney disease    hematuria, has been worked up, her normal   Chronic leukopenia    intermittant since 2010, followed by Dr. Lenord Fellers now   Chronic neck pain    Constipation    Degenerative joint disease of low back 09/02/2015   Dr. Despina Hick   Displacement of lumbar intervertebral disc    Dysrhythmia    Tachycardia, PVCs, PACs   Edema of both lower legs    Fibrocystic breast    Gallbladder problem    GERD (gastroesophageal reflux disease)    Headache    per pt more stress/tension   Heart palpitations    SEE EPIC ENCOUTNER , CARDIOLOGY DR. Gloris Manchester TURNER 2018; reports on 05-16-17 " i haven't had any bouts of those lately"     Hemorrhoids    Hidradenitis suppurativa    History of Clostridium difficile infection 12/2010   History of exercise intolerance    ETT on 04-27-2016-- negative ([facility] treadmill score 7)   History of Helicobacter pylori infection 2001 and 09/ 2012   HSV-2 infection    genital   Hx of adenomatous colonic polyps 10/17/2007   Hydradenitis    per pt currently treated with Humira injection   Hypertension    Hyperthyroidism    Hypocupremia    Hypothyroidism    Hypothyroidism, postsurgical 1980   IBS (irritable bowel syndrome)     Insomnia    Iron deficiency    Joint pain    Leukopenia    Lumbosacral radiculopathy    Lymphocytic colitis 05/14/2022   dx from colonoscopy   Medial meniscus tear    right knee   Mild obstructive sleep apnea    Mitral regurgitation    Mild to moderate by echo 09/2021   Numbness and tingling of right arm    Obesity    Osteoarthritis    Palpitations    Pernicious anemia    b12 def   Peroneal neuropathy    PONV (postoperative nausea and vomiting)    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   Prediabetes    Reactive hypoglycemia followed by pcp   post gastrectomy dumping syndrome   SOB (shortness of breath)    Spinal stenosis    Tendonitis    right shoulder   Varicose veins    Vertigo    Vitamin B 12 deficiency    Vitamin D deficiency    Past Surgical History:  Procedure Laterality Date   BREATH TEK H PYLORI N/A 05/03/2014   Procedure: BREATH TEK H  PYLORI;  Surgeon: Wenda Low, MD;  Location: [facility];  Service: General;  Laterality: N/A;   CARDIOVASCULAR STRESS TEST  03/11/2013   dr Gloris Manchester turner   Low risk nuclear study w/ a very small anterior perfusion defect that most likely represents breast attenuation artifact, less likely this could represent a true perfusion abnormality in the distribution of diagonal branch of the LAD/  normal LV function and wall motion , ef 66%   CATARACT EXTRACTION W/ INTRAOCULAR LENS  IMPLANT, BILATERAL  10/2017   CESAREAN SECTION  1985   CHOLECYSTECTOMY OPEN  1987   COLONOSCOPY  02/2017   Dr Loreta Ave ; per patient they found 7 polyps with polypectomy; on 5 year track    ESOPHAGOGASTRODUODENOSCOPY  02/01/2021   FINE NEEDLE ASPIRATION Left 05/22/2017   Procedure: LEFT KNEE ASPIRATION;  Surgeon: Ollen Gross, MD;  Location: [facility];  Service: Orthopedics;  Laterality: Left;   GASTRIC ROUX-EN-Y N/A 07/06/2014   Procedure: LAPAROSCOPIC ROUX-EN-Y GASTRIC BYPASS, ENTEROLYSIS OF ADHESIONS WITH UPPER ENDOSCOPY;  Surgeon: Luretha Murphy, MD;   Location: [facility];  Service: General;  Laterality: N/A;   HAMMER TOE SURGERY Bilateral 02/17/2008   bilateral foot digit's 2 and 5   HIATAL HERNIA REPAIR N/A 07/06/2014   Procedure: LAPAROSCOPIC REPAIR OF HIATAL HERNIA;  Surgeon: Luretha Murphy, MD;  Location: [facility];  Service: General;  Laterality: N/A;   I & D KNEE WITH POLY EXCHANGE Left 12/11/2019   Procedure: LEFT KNEE POLY-LINER EXCHANGE;  Surgeon: Kathryne Hitch, MD;  Location: [facility];  Service: Orthopedics;  Laterality: Left;   KNEE ARTHROSCOPY Right 12/11/2019   Procedure: RIGHT KNEE ARTHROSCOPY WITH PARTIAL MEDIAL MENISCECTOMY;  Surgeon: Kathryne Hitch, MD;  Location: [facility];  Service: Orthopedics;  Laterality: Right;   KNEE ARTHROSCOPY WITH MEDIAL MENISECTOMY Left 07/17/2017   Procedure: LEFT KNEE ARTHROSCOPY WITH MEDIAL LATERAL MENISECTOMY;  Surgeon: Ollen Gross, MD;  Location: [facility];  Service: Orthopedics;  Laterality: Left;   MASS EXCISION N/A 10/11/2016   Procedure: EXCISION OF PERIANAL MASS;  Surgeon: Romie Levee, MD;  [facility];  Service: General;  Laterality: N/A;   PARTIAL HYSTERECTOMY  2006   has ovaries   REFRACTIVE SURGERY     THYROIDECTOMY  1980   TOTAL ABDOMINAL HYSTERECTOMY  08-25-2002   dr Myrlene Broker   ovaries retained   TOTAL HIP ARTHROPLASTY Right 05/21/2012   Procedure: TOTAL HIP ARTHROPLASTY;  Surgeon: Loanne Drilling, MD;  Location: [facility];  Service: Orthopedics;  Laterality: Right;   TOTAL HIP ARTHROPLASTY Left 01-31-2009  dr Lequita Halt   TOTAL HIP REVISION Left 05/22/2017   Procedure: Left hip bearing surface and head revision;  Surgeon: Ollen Gross, MD;  Location: [facility];  Service: Orthopedics;  Laterality: Left;   TOTAL KNEE ARTHROPLASTY Left 01/20/2018   Procedure: LEFT TOTAL KNEE ARTHROPLASTY;  Surgeon: Ollen Gross, MD;  Location: [facility];  Service: Orthopedics;  Laterality: Left;    TOTAL KNEE ARTHROPLASTY Right 07/13/2022   Procedure: RIGHTTOTAL  KNEE ARTHROPLASTY;  Surgeon: Kathryne Hitch, MD;  Location: [facility];  Service: Orthopedics;  Laterality: Right;   TRANSTHORACIC ECHOCARDIOGRAM  05/03/2016   ef 55-60%/  trivial MR/  mild TR   TUBAL LIGATION     Patient Active Problem List   Diagnosis Date Noted   Polypharmacy 08/13/2022   Status post total right knee replacement 07/13/2022   Weight gain 07/12/2022   Lymphocytic colitis 07/12/2022   Migraine without aura and without status migrainosus,  not intractable 07/02/2022   Chronic conjunctivitis of both eyes 06/28/2022   Generalized abdominal pain 05/24/2022   Chronic rhinitis 05/09/2022   Chronic migraine without aura without status migrainosus, not intractable 12/26/2021   Depressed mood 12/19/2021   Obstructive sleep apnea 12/19/2021   Other hypoglycemia-post bariatric 12/05/2021   Secondary hyperparathyroidism (HCC) 12/05/2021   Inadequate fluid intake 12/05/2021   Inadequate sleep hygiene 12/05/2021   Class 3 severe obesity with body mass index (BMI) of 40.0 to 44.9 in adult [facility]) 12/05/2021   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   SOBOE (shortness of breath on exertion) 11/21/2021   Other fatigue 11/21/2021   Vitamin D deficiency 11/21/2021   Iron deficiency 11/21/2021   Hypoglycemia after bariatric surgery 11/21/2021   Depression screening 11/21/2021   Class 3 severe obesity with serious comorbidity and body mass index (BMI) of 40.0 to 44.9 in adult (HCC) 11/21/2021   Mild obstructive sleep apnea 11/17/2021   Vertigo 11/17/2021   Excessive daytime sleepiness 10/23/2021   Spinal stenosis of lumbar region 07/07/2020   Displacement of lumbar intervertebral disc 06/02/2020   Lumbar spondylosis 06/02/2020   Lumbar stenosis with neurogenic claudication 06/02/2020   Arm numbness 05/30/2020   Chronic neck pain 05/30/2020   Other chronic pain 05/30/2020   Elevated blood-pressure reading, without diagnosis of hypertension 05/30/2020   Status post  arthroscopy of right knee 12/11/2019   Polyethylene wear of left knee joint prosthesis (HCC) 09/09/2019   Acute medial meniscal tear, right, subsequent encounter 08/27/2019   History of total left knee replacement 07/16/2019   Peroneal neuropathy at knee, left 06/25/2019   Lumbosacral radiculopathy 06/07/2019   Iron malabsorption 10/16/2018   IDA (iron deficiency anemia) 10/16/2018   OA (osteoarthritis) of knee 01/20/2018   Acute meniscal tear, medial 07/17/2017   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   Failed total hip arthroplasty, 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/2017   Pain in left knee 04/11/2017   Insomnia 11/08/2016   Pernicious anemia 11/08/2016   Leukopenia 07/09/2016   Hypocupremia 07/09/2016   Palpitations 04/12/2016   Shortness of breath 04/12/2016   Reactive hypoglycemia 10/31/2015   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/31/2015   Lap gastric bypass May 2016 07/06/2014   History of Clostridium difficile infection 08/12/2011   Post-surgical hypothyroidism 08/26/2010   Gastroesophageal reflux disease 08/26/2010   Vitamin D deficiency 08/26/2010   Fibrocystic disease of breast 08/26/2010   Cobalamin deficiency 08/26/2010   Morbid obesity (HCC) 03/20/2010   Backache 03/20/2010    PCP: Margaree Mackintosh, MD  REFERRING PROVIDER: Kathryne Hitch, MD  REFERRING DIAG: [id number] (ICD-10-CM) - Status post total right knee replacement  Rationale for Evaluation and Treatment: Rehabilitation  THERAPY DIAG:  Acute pain of right knee  Stiffness of right knee, not elsewhere classified  Muscle weakness (generalized)  Other abnormalities of gait and mobility  Localized edema  ONSET DATE: 07/13/22 (DOS)   SUBJECTIVE:  SUBJECTIVE STATEMENT: Knee is hurting today; "but it's alright"  PERTINENT  HISTORY:  PMH includes: obesity, gastric bypass, L THA revision, L TKA, OSA, spinal stenosis  PAIN:  Are you having pain? Yes: NPRS scale: 5 currently /10 Pain location: Rt knee Pain description: throbbing, aching, occasional shocks Aggravating factors: end ranges; increased activity Relieving factors: repositioning, icing  PRECAUTIONS:  None  WEIGHT BEARING RESTRICTIONS:  No  FALLS:  Has patient fallen in last 6 months? No  LIVING ENVIRONMENT: Lives with: lives with their spouse Lives in: House/apartment Stairs: Yes: Internal: flight steps; has stair lift and External: 5 steps; can reach both (2 steps in garage; no railing) Has following equipment at home: Retail banker - 2 wheeled  OCCUPATION:  Charity fundraiser at [employer]  PLOF:  Independent and Leisure: walking in the park  PATIENT GOALS:  Regain use of RLE, improve motion in knee   OBJECTIVE: (objective measures completed at initial evaluation unless otherwise dated)   PATIENT SURVEYS:  07/31/22: FOTO 34 (predicted 51) 09/04/22: FOTO 52  COGNITIVE STATUS: Within functional limits for tasks assessed   RED FLAGS: None    SENSATION: 07/31/22: Not tested  POSTURE:  rounded shoulders and forward head   GAIT: 07/31/22 Distance walked: 100' in clinic Assistive device utilized: Environmental consultant - 2 wheeled Level of assistance: Modified independence Comments: antalgic gait with decreased stance on Rt , decreased hip/knee flexion   Knee  LOWER EXTREMITY ROM:     ROM Right eval Left eval Right LE ROM 08/03/22 Right 08/06/22 Right 08/20/22 Right 08/28/22 Right 09/04/22 Right 09/06/22  Knee flexion A: 65 P:71 A: 100 Seated edge of mat, post manual  A: 79 deg P: 82 deg  A: 77 P: 86 A: 99 P: 100 A: 107 P: 111 A: 105 A:107 P:110  Knee extension A: -2 (seated LAQ) P: 0 A: 4 (hyper) 0 deg A/PROM with mild pain at end range A: 0  (seated LAQ) A: 0  (seated LAQ) A: 0  (seated LAQ) A: 0  (seated LAQ)    (Blank rows  = not tested)   LOWER EXTREMITY MMT:    MMT Right eval Left eval  Knee flexion 3-/5   Knee extension 3-/5    (Blank rows = not tested)   TREATMENT:                                                                                                                              DATE:  09/12/22 TherEx SciFit bike seat 12; L4 x 8 min Squats at counter 2x10 Calf raises 2x10 Half dead lift with 10# KB 2x10 Leg press  BLE #87 4x10, Rt only #62 3x10   09/10/22 TherEx SciFit bike seat 11; L4 x 8 min BATCA knee extension 5# RLE only 3x10 BATCA hamstring curl 10# RLE only 3x10 Leg press  BLE #87 4x10, Rt only #62 3x10  Neuro Re-Ed Sidestepping on foam beam in //  bars x 5 laps; intermittent UE support needed Tandem walking with light UE support x 4 laps in // bars; UE support needed   09/06/22 Recumbent bike Seat 11: x5 min partial to full revolutions Tandem on foam pad, 5x10 sec each LE forward Heel raises weight shifted Rt x15 reps Leg press  BLE #87 3x10, Rt only #62 2x10 Supine Rt SLR 2x10 reps Supine AA heel slides x10 reps Seated Rt knee flexion AA 3x10 sec hold  Scar massage at home    09/04/22 TherEx SciFit bike seat 12; L4 x 8 min ROM measurements - see above AA heel slides x 10 reps Seated Rt LAQ with 3 sec hold; 3x10; 5# weight Sit to/from stand 2x10 reps BATCA knee extension 5# RLE only 3x10 BATCA hamstring curl 10# RLE only 3x10 Incline board calf stretch 3x30 sec   08/31/22 TherEx SciFit bike seat 12; L4 x 8 min Leg press DL 27# [id number], then Rt leg only 43# 3X10 Standing hip extension and abduction 3x10 each; L3 band Seated LAQ 5# 3x10 Sit to/from stand 2 x 10 reps without UE support (from elevated mat table)  PATIENT EDUCATION:  Education details: technique with therex; scar mobilization at home Person educated: Patient Education method: Explanation, Demonstration, and Handouts Education comprehension: verbalized understanding, returned demonstration, and  needs further education  HOME EXERCISE PROGRAM: Access Code: [email or web address] URL: [email or web address] Date: 09/12/2022 Prepared by: Moshe Cipro  Exercises - Seated Knee Flexion AAROM  - 5-10 x daily - 7 x weekly - 1 sets - 5-10 reps - 5 sec hold - Supine Heel Slide with Strap  - 5-10 x daily - 7 x weekly - 1 sets - 5-10 reps - Quad Set  - 5-10 x daily - 7 x weekly - 1 sets - 5-10 reps - 5 sec hold - Seated Straight Leg Raise  - 2-3 x daily - 7 x weekly - 1-2 sets - 10 reps - Sit to Stand  - 2 x daily - 7 x weekly - 1 sets - 10 reps - Standing Hip Abduction with Resistance at Ankles and Counter Support  - 1 x daily - 7 x weekly - 2 sets - 10 reps - Standing Hip Extension with Resistance at Ankles and Counter Support  - 1 x daily - 7 x weekly - 2 sets - 10 reps - Mini Squat with Counter Support  - 1 x daily - 7 x weekly - 2-3 sets - 10 reps - Half Deadlift with Kettlebell  - 1 x daily - 7 x weekly - 2-3 sets - 10 reps - Heel Raises with Counter Support  - 1 x daily - 7 x weekly - 2-3 sets - 10 reps   ASSESSMENT:  CLINICAL IMPRESSION: Pt has met/partially met all goals at this time and is pleased with her progress.  She is ready for d/c from PT services.     Per eval - Patient is a 62 y.o. female who was seen today for physical therapy evaluation and treatment for Rt TKA on 07/13/22. She demonstrates decreased strength ROM and balance, as well as gait abnormalities and expected post op pain and swelling affecting functional mobility.  She will benefit from PT to address deficits listed.    OBJECTIVE IMPAIRMENTS: Abnormal gait, decreased activity tolerance, decreased balance, decreased knowledge of use of DME, decreased mobility, difficulty walking, decreased ROM, decreased strength, hypomobility, increased fascial restrictions, increased muscle spasms, and pain.   ACTIVITY LIMITATIONS: carrying, lifting, bending, sitting, standing, squatting,  sleeping, stairs, transfers,  and locomotion level  PARTICIPATION LIMITATIONS: meal prep, cleaning, laundry, driving, shopping, community activity, occupation, and yard work  PERSONAL FACTORS: 3+ comorbidities: obesity, gastric bypass, L THA revision, L TKA, OSA, spinal stenosis  are also affecting patient's functional outcome.   REHAB POTENTIAL: Good  CLINICAL DECISION MAKING: Evolving/moderate complexity  EVALUATION COMPLEXITY: Moderate   GOALS: Goals reviewed with patient? Yes  SHORT TERM GOALS: Target date: 08/28/2022  Independent with initial HEP Goal status: MET 08/20/22  2.  Rt knee AROM improved 0-90 deg for improved function Goal status: MET 08/20/22   LONG TERM GOALS: Target date: 09/25/2022  Independent with final HEP Goal status:  MET 09/12/22  2.  FOTO score improved to 51 Goal status: MET 09/04/22  3.  Rt knee AROM improved to 0-100 for improved function and mobility Goal status: MET 08/28/22  4.  Report pain < 3/10 with amb and standing activities for improved function Goal status: PARTIALLY MET 09/12/22 (increases with more activity, on average 2-3/10)  5.  Amb with LRAD modified independently for improved function and mobility Goal status: MET 09/12/22    PLAN:  PT FREQUENCY: 2x/week  PT DURATION: 8 weeks  PLANNED INTERVENTIONS: Therapeutic exercises, Therapeutic activity, Neuromuscular re-education, Balance training, Gait training, Patient/Family education, Self Care, Joint mobilization, Stair training, DME instructions, Aquatic Therapy, Dry Needling, Electrical stimulation, Cryotherapy, Moist heat, Taping, Vasopneumatic device, Manual therapy, and Re-evaluation.  PLAN FOR NEXT SESSION: d/c PT today   NEXT MD VISIT: 10/03/22   Clarita Crane, PT, DPT 09/12/22 9:52 AM    PHYSICAL THERAPY DISCHARGE SUMMARY  Visits from Start of Care: 14  Current functional level related to goals / functional outcomes: See above   Remaining deficits: See above   Education /  Equipment: HEP   Patient agrees to discharge. Patient goals were partially met. Patient is being discharged due to being pleased with the current functional level.   Clarita Crane, PT, DPT 09/12/22 9:54 AM  [facility] [address] [place], [place], [address] Phone: [phone]   Fax:  [phone]

## 2022-09-13 ENCOUNTER — Ambulatory Visit (INDEPENDENT_AMBULATORY_CARE_PROVIDER_SITE_OTHER): Payer: 59 | Admitting: Internal Medicine

## 2022-09-13 ENCOUNTER — Other Ambulatory Visit (HOSPITAL_COMMUNITY): Payer: Self-pay

## 2022-09-13 ENCOUNTER — Encounter (INDEPENDENT_AMBULATORY_CARE_PROVIDER_SITE_OTHER): Payer: Self-pay | Admitting: Internal Medicine

## 2022-09-13 ENCOUNTER — Encounter: Payer: Self-pay | Admitting: Internal Medicine

## 2022-09-13 VITALS — BP 122/80 | HR 58 | Ht 67.0 in | Wt 274.6 lb

## 2022-09-13 VITALS — BP 122/76 | HR 59 | Temp 98.2°F | Ht 67.0 in | Wt 270.0 lb

## 2022-09-13 DIAGNOSIS — Z6841 Body Mass Index (BMI) 40.0 and over, adult: Secondary | ICD-10-CM

## 2022-09-13 DIAGNOSIS — E890 Postprocedural hypothyroidism: Secondary | ICD-10-CM

## 2022-09-13 DIAGNOSIS — R948 Abnormal results of function studies of other organs and systems: Secondary | ICD-10-CM | POA: Diagnosis not present

## 2022-09-13 DIAGNOSIS — E559 Vitamin D deficiency, unspecified: Secondary | ICD-10-CM | POA: Diagnosis not present

## 2022-09-13 DIAGNOSIS — G4733 Obstructive sleep apnea (adult) (pediatric): Secondary | ICD-10-CM

## 2022-09-13 DIAGNOSIS — E211 Secondary hyperparathyroidism, not elsewhere classified: Secondary | ICD-10-CM | POA: Diagnosis not present

## 2022-09-13 DIAGNOSIS — Z9884 Bariatric surgery status: Secondary | ICD-10-CM

## 2022-09-13 DIAGNOSIS — R7303 Prediabetes: Secondary | ICD-10-CM | POA: Insufficient documentation

## 2022-09-13 DIAGNOSIS — K912 Postsurgical malabsorption, not elsewhere classified: Secondary | ICD-10-CM

## 2022-09-13 DIAGNOSIS — E669 Obesity, unspecified: Secondary | ICD-10-CM | POA: Diagnosis not present

## 2022-09-13 NOTE — Assessment & Plan Note (Signed)
Patient has a slower than predicted metabolism. IC 1402 vs. calculated 1804.Likely secondary to metabolic adaptations associated with gastric bypass and loss of muscle mass.  This may contribute to weight gain, chronic fatigue and difficulty losing weight. We reviewed measures to improve metabolism including progressive strengthening exercises, increasing protein intake at every meal and maintaining adequate hydration and sleep.

## 2022-09-13 NOTE — Assessment & Plan Note (Signed)
Most recent A1c is  Lab Results  Component Value Date   HGBA1C 5.7 (H) 07/05/2022   HGBA1C 5.7 (H) 08/20/2013    Patient aware of disease state and risk of progression. This may contribute to abnormal cravings, fatigue and diabetic complications without having diabetes.   We reviewed treatment options which includes losing 7 to 10% of body weight, increasing physical activity to a goal of 150 minutes a week at moderate intensity. She may also be a candidate for pharmacoprophylaxis with metformin, we will consider at the next office visit.

## 2022-09-13 NOTE — Progress Notes (Signed)
Office: [phone]  /  Fax: [phone]  WEIGHT SUMMARY AND BIOMETRICS  Vitals Temp: 98.2 F (36.8 C) BP: 122/76 Pulse Rate: (!) 59 SpO2: 97 %   Anthropometric Measurements Height: 5\' 7"  (1.702 m) Weight: 270 lb (122.5 kg) BMI (Calculated): 42.28 Weight at Last Visit: 276 lb Weight Lost Since Last Visit: 6 lb Weight Gained Since Last Visit: 0 Starting Weight: 260 lb Total Weight Loss (lbs): 0 lb (0 kg) Peak Weight: 292 lb   Body Composition  Body Fat %: 54.1 % Fat Mass (lbs): 146.2 lbs Muscle Mass (lbs): 117.8 lbs Visceral Fat Rating : 19    No data recorded Today's Visit #: 13  Starting Date: 07/07/20   HPI  Chief Complaint: OBESITY  Victoria [name] is here to discuss her progress with her obesity treatment plan. She is on the practicing portion control and making smarter food choices, such as increasing vegetables and decreasing simple carbohydrates and states she is following her eating plan approximately 0 % of the time. She states she is exercising physical therapy 45 minutes 2 times per week.  Interval History:  Since last office visit she has lost 6 lbs. This time around she did not lose muscle. Getting PT as physical activity but has completed. Not sleeping well, working with sleep specialist and trying different hypnotics.  She has mild OSA but has issues with her mask some of the problems involved leaks.  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  We had ordered an upper GI series to evaluate pouch and outlet expansion upper GI did not suggest this.  She has been working on not skipping meals and drinking more water.  She has adequate appetite suppression from her surgery her primary problem is a slow metabolism likely due to chronic skipping and loss of muscle over time.  Patient has been advised to avoid protein shakes by gastroenterology because of her colitis.  48 hr recall: Breakfast skipped, dinner: [employer] fish  sandwich, watermelon as snack BF- boiled eggs Lunch: chic fila 3 strips. 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  Denies problems with satiety and satiation.  Denies problems with eating patterns and portion control.  Denies abnormal cravings. Denies feeling deprived or restricted.   Barriers identified: having difficulty with meal prep and planning, physical inactivity, predilection for convenience or prepackaged foods, medical comorbidities, difficulty implementing reduced calorie nutrition plan, and presence of obesogenic drugs.   Pharmacotherapy for weight loss: She is currently taking no anti-obesity medication.    ASSESSMENT AND PLAN  TREATMENT PLAN FOR OBESITY:  Recommended Dietary Goals  [name] is currently in the action stage of change. As such, her goal is to continue weight management plan. She has agreed to: portion control, balanced plate and making smarter food choices, such as increasing vegetables, protein intake and reducing simple carbohydrates and processed foods   Behavioral Intervention  We discussed the following Behavioral Modification Strategies today: increasing lean protein intake, decreasing simple carbohydrates , increasing vegetables, increasing lower glycemic fruits, increasing fiber rich foods, avoiding skipping meals, increasing water intake, work on meal planning and preparation, decreasing eating out or consumption of processed foods, and making healthy choices when eating convenient foods, planning for success, and better snacking choices.  Additional resources provided today: Handout on how to make protein smoothies  Recommended Physical Activity Goals  [name] has been advised to work up to 150 minutes of moderate  intensity aerobic activity a week and strengthening exercises 2-3 times per week for cardiovascular health, weight loss maintenance and preservation of muscle  mass.   She has agreed to :  Think about ways to increase daily physical activity and overcoming barriers to exercise and she has completed physical therapy but because of postoperative knee pain she still has difficulty with pain.  This is being managed by orthopedic surgery  Pharmacotherapy We discussed various medication options to help [facility] with her weight loss efforts and we both agreed to : She is not a good candidate for antiobesity medications due to restrictive eating patterns.  ASSOCIATED CONDITIONS ADDRESSED TODAY  Abnormal metabolism Assessment & Plan: Patient has a slower than predicted metabolism. IC 1402 vs. calculated 1804.Likely secondary to metabolic adaptations associated with gastric bypass and loss of muscle mass.  This may contribute to weight gain, chronic fatigue and difficulty losing weight. We reviewed measures to improve metabolism including progressive strengthening exercises, increasing protein intake at every meal and maintaining adequate hydration and sleep.     History of Roux-en-Y gastric bypass Assessment & Plan: Weight regain likely secondary to maladaptive behaviors following surgery.  Reviewed upper GI series ordered this shows normal postoperative anatomy.  She is not candidate for TORE.  She is not taking prenatal vitamin has had been recommended.  Fortunately she did not have nutritional deficiencies when we screened her at initial visit.  I recommend checking vitamin levels again at the 16-month mark.  She will try a multivitamin and a gummy form.   Obesity with current BMI of 43  Obstructive sleep apnea Assessment & Plan: Patient reports she has mild OSA.  She is working with sleep medicine and using hypnotics.  It seems that she is having problems with mask.  Losing 15% of body weight may improve AHI continue with weight management.   Prediabetes Assessment & Plan: Most recent A1c is  Lab Results  Component Value Date   HGBA1C 5.7 (H)  07/05/2022   HGBA1C 5.7 (H) 08/20/2013    Patient aware of disease state and risk of progression. This may contribute to abnormal cravings, fatigue and diabetic complications without having diabetes.   We reviewed treatment options which includes losing 7 to 10% of body weight, increasing physical activity to a goal of 150 minutes a week at moderate intensity. She may also be a candidate for pharmacoprophylaxis with metformin, we will consider at the next office visit.      PHYSICAL EXAM:  Blood pressure 122/76, pulse (!) 59, temperature 98.2 F (36.8 C), height 5\' 7"  (1.702 m), weight 270 lb (122.5 kg), SpO2 97%. Body mass index is 42.29 kg/m.  General: She is overweight, cooperative, alert, well developed, and in no acute distress. PSYCH: Has normal mood, affect and thought process.   HEENT: EOMI, sclerae are anicteric. Lungs: Normal breathing effort, no conversational dyspnea. Extremities: No edema.  Neurologic: No gross sensory or motor deficits. No tremors or fasciculations noted.    DIAGNOSTIC DATA REVIEWED:  BMET    Component Value Date/Time   NA 144 09/03/2022 1425   K 3.7 09/03/2022 1425   CL 104 09/03/2022 1425   CO2 24 09/03/2022 1425   GLUCOSE 91 09/03/2022 1425   GLUCOSE 85 07/18/2022 1300   BUN 9 09/03/2022 1425   CREATININE 0.95 09/03/2022 1425   CREATININE 1.23 (H) 07/09/2022 1132   CALCIUM 9.2 09/03/2022 1425   GFRNONAA 57 (L) 07/18/2022 1300   GFRNONAA 93 06/30/2020 1105   GFRAA 108  06/30/2020 1105   Lab Results  Component Value Date   HGBA1C 5.7 (H) 07/05/2022   HGBA1C 5.7 (H) 08/20/2013   Lab Results  Component Value Date   INSULIN 7.1 05/24/2022   INSULIN 4 09/16/2015   Lab Results  Component Value Date   TSH 3.17 07/05/2022   CBC    Component Value Date/Time   WBC 4.5 09/03/2022 1425   WBC 4.6 08/17/2022 1402   WBC 6.4 07/18/2022 1300   RBC 3.82 09/03/2022 1425   RBC 3.80 (L) 08/17/2022 1403   RBC 3.86 (L) 08/17/2022 1402    HGB 12.2 09/03/2022 1425   HGB 12.6 07/09/2016 1529   HCT 37.2 09/03/2022 1425   HCT 38.2 07/09/2016 1529   PLT 209 09/03/2022 1425   MCV 97 09/03/2022 1425   MCV 96.9 07/09/2016 1529   MCH 31.9 09/03/2022 1425   MCH 32.1 08/17/2022 1402   MCHC 32.8 09/03/2022 1425   MCHC 31.9 08/17/2022 1402   RDW 13.0 09/03/2022 1425   RDW 13.5 07/09/2016 1529   Iron Studies    Component Value Date/Time   IRON 84 08/17/2022 1402   IRON 49 09/18/2018 0853   TIBC 444 08/17/2022 1402   TIBC 415 09/18/2018 0853   FERRITIN 58 08/17/2022 1402   FERRITIN 11 (L) 09/18/2018 0853   IRONPCTSAT 19 08/17/2022 1402   IRONPCTSAT 40 06/29/2021 1105   Lipid Panel     Component Value Date/Time   CHOL 197 07/05/2022 0921   CHOL 191 02/20/2022 0809   TRIG 41 07/05/2022 0921   HDL 86 07/05/2022 0921   HDL 84 02/20/2022 0809   CHOLHDL 2.3 07/05/2022 0921   VLDL 9 03/12/2016 1038   LDLCALC 99 07/05/2022 0921   Hepatic Function Panel     Component Value Date/Time   PROT 7.8 09/03/2022 1425   ALBUMIN 4.2 09/03/2022 1425   AST 15 09/03/2022 1425   AST 26 10/16/2018 0903   ALT 17 09/03/2022 1425   ALT 26 10/16/2018 0903   ALKPHOS 67 09/03/2022 1425   BILITOT 0.2 09/03/2022 1425   BILITOT 0.5 10/16/2018 0903   BILIDIR 0.1 03/16/2013 0740      Component Value Date/Time   TSH 3.17 07/05/2022 0921   Nutritional Lab Results  Component Value Date   VD25OH 50.1 02/20/2022   VD25OH 29.4 (L) 11/20/2021   VD25OH 18.9 (L) 07/07/2020     Return in about 3 weeks (around 10/04/2022) for For Weight Mangement with Dr. Rikki Spearing may be moved to week I'm here.Marland Kitchen She was informed of the importance of frequent follow up visits to maximize her success with intensive lifestyle modifications for her multiple health conditions.   ATTESTASTION STATEMENTS:  Reviewed by clinician on day of visit: allergies, medications, problem list, medical history, surgical history, family history, social history, and previous  encounter notes.   I have spent 40 minutes in the care of the patient today including: preparing to see patient (e.g. review and interpretation of tests, old notes ), obtaining and/or reviewing separately obtained history, counseling and educating the patient, documenting clinical information in the electronic or other health care record, and independently interpreting results and communicating results to the patient,family, or caregiver   Worthy Rancher, MD

## 2022-09-13 NOTE — Patient Instructions (Addendum)
Please: Eat lower glycemic index/load foods: [email or web address].[email or web address] Eat solids rather than liquids or semiliquid meals Have 3 meals a day + as needed snacks Eat as much fruit and veggies as you can. Berries, pears, apples, peaches, apricots  - are the best Do not drink liquids with a meal, separate them by at least 30 min Start the meal with protein and fat and end with carbs Do not drink any sweet drinks Carry a snack with you everywhere. Best - to contain ~15 g of carbs  Continue Levothyroxine 112 mcg daily:  Take the thyroid hormone every day, with water, at least 30 minutes before breakfast, separated by at least 4 hours from: - acid reflux medications - calcium - iron - multivitamins  Take 1 tbspoon calcium citrate a day.  Please return in 6 months.

## 2022-09-13 NOTE — Assessment & Plan Note (Signed)
Patient reports she has mild OSA.  She is working with sleep medicine and using hypnotics.  It seems that she is having problems with mask.  Losing 15% of body weight may improve AHI continue with weight management.

## 2022-09-13 NOTE — Progress Notes (Signed)
Patient ID: Victoria [name], female   DOB: [birth year], 62 y.o.   MRN: [id number]  HPI  Victoria [name] is a 62 y.o.-year-old female, initially referred by her PCP, Dr. Lenord Fellers, returning for follow-up for secondary hyperparathyroidism and vitamin D deficiency.  She was previously seen by Dr. 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 She is the wife of Victoria [name], also my pt.  Interim hx: She had R knee TKR 07/13/2022. She still has some pain.  She just finished physical therapy.  She will return to work next month.  However, she plans to retire soon. Since last visit, she started liquid calcium + vit D3 + Mg - 2 tbsp (1000 mg Calcium citrate + 1000 units vit D).  She can only take this every 2 days due to constipation. She has intermittent diarrhea from her lymphocytic colitis.  Now on Entocort.  Hyperparathyroidism: Pt was dx with hyperparathyroidism in 10/2021.  No history of hypercalcemia.    I reviewed pt's pertinent labs: Component     Latest Ref Rng 03/15/2022  Calcium Ionized     4.7 - 5.5 mg/dL 4.8    Lab Results  Component Value Date   PTH 51 03/15/2022   PTH 157 (H) 12/07/2021   PTH 101 (H) 11/20/2021   CALCIUM 9.2 09/03/2022   CALCIUM 8.5 (L) 07/18/2022   CALCIUM 8.0 (L) 07/14/2022   CALCIUM 8.6 (L) 07/02/2022   CALCIUM 8.6 (L) 05/24/2022   CALCIUM 9.2 02/20/2022   CALCIUM 8.6 (L) 11/20/2021   CALCIUM 8.2 (L) 08/01/2021   CALCIUM 8.5 (L) 06/29/2021   CALCIUM 9.2 12/29/2020   To investigate of hyperparathyroidism, she had a Technetium sestamibi scan (12/16/2021) showing decreased uptake in the thyroid gland, but no parathyroid adenoma evident.  Of note, at last visit we checked a 24-hour urine calcium and this was low: Component     Latest Ref Rng 03/19/2022  Creatinine, 24H Ur     0.50 - 2.15 g/24 h 1.11   Calcium, 24H Urine     mg/24 h 13 (L)   I advised her to start calcium supplementation-Gummies 300  or 500 mg of calcium citrate, which ever she can find (will try not to use calcium carbonate since she may use PPIs-has Protonix at hand).  She previously had constipation with calcium tablets.  Patient has a pertinent history of R en Y gastric bypass in 2016 >> lost 93 lbs and then gained ~all back.  She is seen in the weight management clinic.  She is currently on supplementation with: - no Calcium - Vitamin D - ergocalciferol 50,000 units 2x a week - off for 3 weeks  No fractures or falls.   She had TAH 2002 -went through menopause in her 79s.  She has osteoarthritis.  No h/o kidney stones. She has a cyst >> saw urology.  No h/o CKD. Last BUN/Cr: Lab Results  Component Value Date   BUN 9 09/03/2022   BUN 14 07/18/2022   CREATININE 0.95 09/03/2022   CREATININE 1.11 (H) 07/18/2022   Pt is not on HCTZ.  She has a h/o vitamin D deficiency. Reviewed vit D levels: Lab Results  Component Value Date   VD25OH 50.1 02/20/2022   VD25OH 29.4 (L) 11/20/2021   VD25OH 18.9 (L) 07/07/2020   VD25OH 41 10/02/2018   VD25OH 27 (L) 03/17/2018   VD25OH 32 03/15/2017   VD25OH 34 03/12/2016   VD25OH 30 02/25/2015   VD25OH 38  08/17/2014   VD25OH 19 (L) 02/22/2014   Pt does not have a FH of hypercalcemia, pituitary tumors, thyroid cancer, or osteoporosis.   Pt. 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  Glucose levels were always normal on labs per review of the chart.  She recently had dental work >> drank smoothies >> sugars dropped to 34; no recent lows.  Reviewed HbA1c levels -in the prediabetic range: Lab Results  Component Value Date   HGBA1C 5.7 (H) 07/05/2022   HGBA1C 5.6 05/24/2022   HGBA1C 5.8 (H) 02/20/2022   HGBA1C 5.7 (H) 11/20/2021   HGBA1C 5.4 06/30/2020   HGBA1C 5.7 (H) 05/16/2020   HGBA1C 5.5 03/15/2017   HGBA1C 5.7 (H) 09/09/2015   HGBA1C 5.7 (H) 08/20/2013   She was previously on acarbose prescribed by Dr. Lucianne Muss.  She  contemplated starting a GLP-1 receptor agonist, but was recommended against it due to her history of reactive hypoglycemia.  At last visit I advised her that she could definitely try this.  She has a history of diabetes in her sister.  She has a h/o Hyperthyroidism in 1970s >> had almost complete thyroidectomy (1980) >> postsurgical hypothyroidism:  Lab Results  Component Value Date   TSH 3.17 07/05/2022   TSH 1.040 02/20/2022   TSH 1.91 06/29/2021   TSH 2.90 12/29/2020   TSH 0.60 06/30/2020   TSH 1.01 03/03/2020   TSH 0.72 01/07/2020   TSH 0.84 04/09/2019   TSH 3.600 09/18/2018   TSH 2.78 03/17/2018   TSH 2.66 09/09/2017   TSH 1.83 03/15/2017   TSH 1.40 09/03/2016   TSH 2.58 03/12/2016   TSH 2.70 09/09/2015   TSH 2.82 07/05/2015   TSH 11.38 (H) 05/26/2015   TSH 0.06 (L) 04/25/2015   TSH 0.012 (L) 02/25/2015   TSH 0.012 (L) 08/17/2014   She is on Levothyroxine 112 mcg daily: - in am - fasting - at least 30 min from b'fast - + calcium in the evening - no iron - no multivitamins now, was on Prenatals - constipation - no PPIs (has Protonix on hand) - not on Biotin  + FH of thyroid ds. In [place], [place] aunt, brother.  She has pernicious anemia - on B12 inj. She is on Entocort for lymphocytic colitis. She has hot flushes  - estrogen gel not covered. She allergic to the patch.  ROS:  + fatigue, + heat intolerance, + hot flashes  Past Medical History:  Diagnosis Date   Anxiety    Back pain    Bursitis    right shoulder   Chronic kidney disease    hematuria, has been worked up, her normal   Chronic leukopenia    intermittant since 2010, followed by Dr. Lenord Fellers now   Chronic neck pain    Constipation    Degenerative joint disease of low back 09/02/2015   Dr. Despina Hick   Displacement of lumbar intervertebral disc    Dysrhythmia    Tachycardia, PVCs, PACs   Edema of both lower legs    Fibrocystic breast    Gallbladder problem    GERD (gastroesophageal reflux disease)     Headache    per pt more stress/tension   Heart palpitations    SEE EPIC ENCOUTNER , CARDIOLOGY DR. Gloris Manchester TURNER 2018; reports on 05-16-17 " i haven't had any bouts of those lately"     Hemorrhoids    Hidradenitis suppurativa    History of Clostridium difficile infection 12/2010   History of exercise  intolerance    ETT on 04-27-2016-- negative ([facility] treadmill score 7)   History of Helicobacter pylori infection 2001 and 09/ 2012   HSV-2 infection    genital   Hx of adenomatous colonic polyps 10/17/2007   Hydradenitis    per pt currently treated with Humira injection   Hypertension    Hyperthyroidism    Hypocupremia    Hypothyroidism    Hypothyroidism, postsurgical 1980   IBS (irritable bowel syndrome)    Insomnia    Iron deficiency    Joint pain    Leukopenia    Lumbosacral radiculopathy    Lymphocytic colitis 05/14/2022   dx from colonoscopy   Medial meniscus tear    right knee   Mild obstructive sleep apnea    Mitral regurgitation    Mild to moderate by echo 09/2021   Numbness and tingling of right arm    Obesity    Osteoarthritis    Palpitations    Pernicious anemia    b12 def   Peroneal neuropathy    PONV (postoperative nausea and vomiting)    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   Prediabetes    Reactive hypoglycemia followed by pcp   post gastrectomy dumping syndrome   SOB (shortness of breath)    Spinal stenosis    Tendonitis    right shoulder   Varicose veins    Vertigo    Vitamin B 12 deficiency    Vitamin D deficiency    Past Surgical History:  Procedure Laterality Date   BREATH TEK H PYLORI N/A 05/03/2014   Procedure: BREATH TEK H PYLORI;  Surgeon: Wenda Low, MD;  Location: [facility];  Service: General;  Laterality: N/A;   CARDIOVASCULAR STRESS TEST  03/11/2013   dr Gloris Manchester turner   Low risk nuclear study w/ a very small anterior perfusion defect that most likely represents breast attenuation artifact, less likely this could represent a true  perfusion abnormality in the distribution of diagonal branch of the LAD/  normal LV function and wall motion , ef 66%   CATARACT EXTRACTION W/ INTRAOCULAR LENS  IMPLANT, BILATERAL  10/2017   CESAREAN SECTION  1985   CHOLECYSTECTOMY OPEN  1987   COLONOSCOPY  02/2017   Dr Loreta Ave ; per patient they found 7 polyps with polypectomy; on 5 year track    ESOPHAGOGASTRODUODENOSCOPY  02/01/2021   FINE NEEDLE ASPIRATION Left 05/22/2017   Procedure: LEFT KNEE ASPIRATION;  Surgeon: Ollen Gross, MD;  Location: [facility];  Service: Orthopedics;  Laterality: Left;   GASTRIC ROUX-EN-Y N/A 07/06/2014   Procedure: LAPAROSCOPIC ROUX-EN-Y GASTRIC BYPASS, ENTEROLYSIS OF ADHESIONS WITH UPPER ENDOSCOPY;  Surgeon: Luretha Murphy, MD;  Location: [facility];  Service: General;  Laterality: N/A;   HAMMER TOE SURGERY Bilateral 02/17/2008   bilateral foot digit's 2 and 5   HIATAL HERNIA REPAIR N/A 07/06/2014   Procedure: LAPAROSCOPIC REPAIR OF HIATAL HERNIA;  Surgeon: Luretha Murphy, MD;  Location: [facility];  Service: General;  Laterality: N/A;   I & D KNEE WITH POLY EXCHANGE Left 12/11/2019   Procedure: LEFT KNEE POLY-LINER EXCHANGE;  Surgeon: Kathryne Hitch, MD;  Location: [facility];  Service: Orthopedics;  Laterality: Left;   KNEE ARTHROSCOPY Right 12/11/2019   Procedure: RIGHT KNEE ARTHROSCOPY WITH PARTIAL MEDIAL MENISCECTOMY;  Surgeon: Kathryne Hitch, MD;  Location: [facility];  Service: Orthopedics;  Laterality: Right;   KNEE ARTHROSCOPY WITH MEDIAL MENISECTOMY Left 07/17/2017   Procedure: LEFT KNEE ARTHROSCOPY WITH MEDIAL LATERAL MENISECTOMY;  Surgeon: Lequita Halt,  Homero Fellers, MD;  Location: [facility];  Service: Orthopedics;  Laterality: Left;   MASS EXCISION N/A 10/11/2016   Procedure: EXCISION OF PERIANAL MASS;  Surgeon: Romie Levee, MD;  Location: [facility];  Service: General;  Laterality: N/A;   PARTIAL HYSTERECTOMY  2006   has ovaries   REFRACTIVE SURGERY     THYROIDECTOMY  1980   TOTAL ABDOMINAL  HYSTERECTOMY  08-25-2002   dr Myrlene Broker   ovaries retained   TOTAL HIP ARTHROPLASTY Right 05/21/2012   Procedure: TOTAL HIP ARTHROPLASTY;  Surgeon: Loanne Drilling, MD;  Location: [facility];  Service: Orthopedics;  Laterality: Right;   TOTAL HIP ARTHROPLASTY Left 01-31-2009  dr Lequita Halt   TOTAL HIP REVISION Left 05/22/2017   Procedure: Left hip bearing surface and head revision;  Surgeon: Ollen Gross, MD;  Location: [facility];  Service: Orthopedics;  Laterality: Left;   TOTAL KNEE ARTHROPLASTY Left 01/20/2018   Procedure: LEFT TOTAL KNEE ARTHROPLASTY;  Surgeon: Ollen Gross, MD;  Location: [facility];  Service: Orthopedics;  Laterality: Left;    TOTAL KNEE ARTHROPLASTY Right 07/13/2022   Procedure: RIGHTTOTAL KNEE ARTHROPLASTY;  Surgeon: Kathryne Hitch, MD;  Location: [facility];  Service: Orthopedics;  Laterality: Right;   TRANSTHORACIC ECHOCARDIOGRAM  05/03/2016   ef 55-60%/  trivial MR/  mild TR   TUBAL LIGATION     Social History   Socioeconomic History   Marital status: Married    Spouse name: Thereasa Distance   Number of children: 2   Years of education: Not on file   Highest education level: Not on file  Occupational History   Occupation: Armed forces operational officer GI    Employer: [employer]  Tobacco Use   Smoking status: Never   Smokeless tobacco: Never  Vaping Use   Vaping status: Never Used  Substance and Sexual Activity   Alcohol use: No   Drug use: No   Sexual activity: Not Currently    Partners: Male    Birth control/protection: Surgical    Comment: hysterectomy  Other Topics Concern   Not on file  Social History Narrative   Lives at home with spouse   Right handed   Caffeine: diet zero mtn dew, occasionally    Social Determinants of Health   Financial Resource Strain: Not on file  Food Insecurity: No Food Insecurity (07/13/2022)   Hunger Vital Sign    Worried About Running Out of Food in the Last Year: Never true    Ran Out of Food in the Last Year: Never true   Transportation Needs: No Transportation Needs (07/13/2022)   PRAPARE - Administrator, Civil Service (Medical): No    Lack of Transportation (Non-Medical): No  Physical Activity: Not on file  Stress: Not on file  Social Connections: Not on file  Intimate Partner Violence: Not At Risk (07/13/2022)   Humiliation, Afraid, Rape, and Kick questionnaire    Fear of Current or Ex-Partner: No    Emotionally Abused: No    Physically Abused: No    Sexually Abused: No   Current Outpatient Medications on File Prior to Visit  Medication Sig Dispense Refill   adalimumab (HUMIRA, 2 PEN,) 40 MG/0.8ML PNKT pen Inject 1 pen subcutaneously every week (Patient taking differently: once a week.) 4 each 5   albuterol (VENTOLIN HFA) 108 (90 Base) MCG/ACT inhaler Inhale 2 puffs into the lungs every 4 hours as needed for wheezing or shortness of breath. 18 g 3   apixaban (ELIQUIS) 2.5  MG TABS tablet Take 1 tablet (2.5 mg total) by mouth every 12 (twelve) hours. 30 tablet 0   aspirin EC 81 MG tablet Take 81 mg by mouth 2 (two) times daily. Swallow whole.     Carbinoxamine Maleate 4 MG TABS Take 1-2 tablets at night as needed for drainage and allergies. May cause drowsiness. 28 tablet 5   celecoxib (CELEBREX) 200 MG capsule Take 1 capsule (200 mg total) by mouth 2 (two) times daily as needed. 60 capsule 3   clobetasol ointment (TEMOVATE) 0.05 % Apply to skin twice daily as directed for up to 2 weeks as needed 60 g 0   ergocalciferol (VITAMIN D2) 1.25 MG (50000 UT) capsule Take 1 capsule (50,000 Units total) by mouth 2 (two) times a week for Vitamin deficiency and gastric bypass. 8 capsule 1   escitalopram (LEXAPRO) 10 MG tablet Take 1 tablet (10 mg total) by mouth daily. 90 tablet 1   Estradiol (DIVIGEL) 1 MG/GM GEL Place 1 Package onto the skin daily. 90 g 1   eszopiclone (LUNESTA) 1 MG TABS tablet Take 1 tablet (1 mg total) by mouth at bedtime as needed for sleep. Take immediately before bedtime. Can  increase dose up to 3 tablets (3 mg) in 1 mg increments on subsequent nights. 10 tablet 0   flecainide (TAMBOCOR) 50 MG tablet Take 1 tablet (50 mg total) by mouth daily at 6 (six) AM. 90 tablet 3   gabapentin (NEURONTIN) 600 MG tablet Take 1 tablet (600 mg total) by mouth 3 (three) times daily. (Patient taking differently: Take 600 mg by mouth at bedtime. Additional 600 mg if needed for pain) 90 tablet 5   Galcanezumab-gnlm (EMGALITY) 120 MG/ML SOAJ Inject 120 mg into the skin every 30 (thirty) days. 1.12 mL 11   ipratropium (ATROVENT) 0.03 % nasal spray Use 1-2 sprays in each nostril up to 3 times daily as needed for runny nose. Can take 15 minutes prior to meals. 30 mL 3   levocetirizine (XYZAL) 5 MG tablet Take 1 tablet (5 mg total) by mouth every evening. (Patient not taking: Reported on 09/03/2022) 30 tablet 5   levothyroxine (SYNTHROID) 112 MCG tablet Take 1 tablet (112 mcg total) by mouth daily. 90 tablet 1   magic mouthwash (nystatin, hydrocortisone, diphenhydrAMINE) suspension Swish and spit 30 mLs 4 (four) times daily. 480 mL 0   metoprolol succinate (TOPROL-XL) 25 MG 24 hr tablet Take 1 tablet (25 mg total) by mouth daily. 90 tablet 3   montelukast (SINGULAIR) 10 MG tablet Take 1 tablet (10 mg total) by mouth at bedtime. (Patient not taking: Reported on 09/03/2022) 30 tablet 3   nystatin (MYCOSTATIN) 100000 UNIT/ML suspension Take by mouth.     Olopatadine HCl 0.2 % SOLN Apply 1 drop to eye daily as needed. (Patient taking differently: Apply 1 drop to eye daily as needed (allergies).) 2.5 mL 5   ondansetron (ZOFRAN-ODT) 4 MG disintegrating tablet Take 1 tablet (4 mg total) by mouth every 8 (eight) hours as needed for nausea or vomiting. 20 tablet 0   pantoprazole (PROTONIX) 40 MG tablet Take 1 tablet by mouth daily 20 minutes before breakfast (Patient taking differently: Take 40 mg by mouth daily as needed (Heartburn).) 90 tablet 3   sulfamethoxazole-trimethoprim (BACTRIM DS) 800-160 MG tablet  Take 1 tablet by mouth 2 (two) times daily as needed for flare 60 tablet 1   terconazole (TERAZOL 7) 0.4 % vaginal cream Place 1 applicatorful vaginally at bedtime for seven nights.  Then use one applicatorful once a week for 6 months. 45 g 4   tiZANidine (ZANAFLEX) 4 MG capsule Take 1 capsule (4 mg total) by mouth 3 (three) times daily as needed for muscle spasms. 90 capsule 1   triamcinolone cream (KENALOG) 0.1 % Apply 1 Application topically 2 (two) times daily. Use for 2 weeks, and then off for 2 weeks as needed. 80 g 1   Ubrogepant (UBRELVY) 100 MG TABS Take 1 tablet (100 mg total) by mouth every 2 (two) hours as needed. Maximum 200mg  a day. 16 tablet 11   Current Facility-Administered Medications on File Prior to Visit  Medication Dose Route Frequency Provider Last Rate Last Admin   gadobenate dimeglumine (MULTIHANCE) injection 20 mL  20 mL Intravenous Once PRN Anson Fret, MD       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   Estradiol Rash and Other (See Comments)    Local rash with estradiol patches   Family History  Problem Relation Age of Onset   High blood pressure Mother    Hypertension Mother    Headache Mother    High Cholesterol Mother    Thyroid disease Mother    Obesity Mother    Kidney disease Mother    Hypertension Father    Diabetes Father    High blood pressure Father    High Cholesterol Father    Heart disease Father    Obesity Father    Diabetes Sister    Hypertension Sister    Thyroid disease Brother    Thyroid disease Maternal Aunt    Migraines Neg Hx    PE: BP 122/80   Pulse (!) 58   Ht 5\' 7"  (1.702 m)   Wt 274 lb 9.6 oz (124.6 kg)   SpO2 99%   BMI 43.01 kg/m  Wt Readings from Last 3 Encounters:  09/13/22 274 lb 9.6 oz (124.6 kg)  09/04/22 274 lb (124.3 kg)  09/03/22 274 lb 6.4 oz (124.5 kg)   Constitutional: overweight, in NAD Eyes: EOMI, no  exophthalmos ENT: no thyromegaly, no cervical lymphadenopathy Cardiovascular: RRR, No MRG Respiratory: CTA B Musculoskeletal: no deformities Skin: warm, no rashes Neurological: no tremor with outstretched hands  Assessment: 1. Hyperparathyroidism  2.  Vitamin D deficiency  3.  Reactive hypoglycemia  4.  Postsurgical hypothyroidism  Plan: 1.  Secondary hyperparathyroidism and vitamin D deficiency  - patient has a history of gastric bypass with malabsorption and subsequent low vitamin D and calcium along with elevated PTH.  We discussed that this is indicative of secondary, rather than primary hyperparathyroidism.  Her technetium sestamibi scan confirmed this diagnosis.  The highest PTH was 157 in 11/2021 for a low calcium of 8.6 and a borderline low vitamin D of 29.4.  She is currently on ergocalciferol 50,000 units twice a week. -She has no complications from hyperparathyroidism: No nephrolithiasis, osteoporosis, fractures -at last visit, vitamin D level was normal, at 50.  Ionized calcium was normal.  We checked the 24-hour urine calcium and this was low, at 17.  I recommended to add calcium Gummies.  She tried calcium tablets in the past but could not continue due to constipation.  Since last visit, however, she started liquid calcium.  2 tablespoon contain 1000 mg of calcium and 1000 units of vitamin D.  She is not able to take this daily, with every other day, due to constipation.  At today's visit, I advised her to take 1 tablespoon daily, containing 500 mg of calcium.  She agrees to try this.  We discussed that if constipation returns, she can try MiraLAX and titrate to effect. -Latest calcium level was normal 09/03/2022.  We will not repeat this today.  I was planning to check a vitamin D level today, but she has been out of her ergocalciferol for 3 weeks.  She has an appointment with the prescribing provider this afternoon.  Plan to repeat her vitamin D at next visit.  Will also repeat  her 24-hour urine for calcium at that time  2. Reactive hypoglycemia -Developed after gastric bypass -She previously had hypoglycemia with sugars as low as 35, but no recent levels -She also has a history of prediabetes, with the latest HbA1c borderline at 5.7% 2 months ago -We discussed that malabsorptive gastric bypass surgeries carry the risk of hypoglycemia developed after meals and usually dietary changes are necessary.  She is paying attention and trying to follow the recommended advise given at last visit. At that time, and again today, we discussed about different techniques to avoid low blood sugars: Eat lower glycemic index/load foods-gave her references Eat solids rather than liquids or semiliquid meals Have 3 meals a day +/- snacks as needed Eat as much fruit and veggies as you can.  Berries, pears, apples, peaches, apricots - are the best Do not drink liquids with a meal, separating them by at least 30 minutes Starts the meal with protein and fat and end with carbs Not drink any sweet drinks Carry a snack with you everywhere.  Best to contain ~ 15 g of carbs. -We previously also discussed about possibly using GLP-1 receptor agonist to help with weight loss.  This class of medication is not contraindicated in the setting of reactive hypoglycemia and we occasionally use GLP-1 receptor agonist to help with this condition.  I advised her to try this and see how she felt.  If sugars were to drop, she would need to stop.  She does have a history of nausea so this may be an impediment.  He did not start this since last visit. -At last visit I recommended to see a nutritionist and he signed a meal plan that could work for her.  She was previously given a diet by the weight management clinic, but she was not able to eat the amount as recommended.  3. Postsurgical hypothyroidism - latest thyroid labs reviewed with pt. >> normal: Lab Results  Component Value Date   TSH 3.17 07/05/2022  - she  continues on LT4 112 mcg daily - pt feels good on this dose.  He did have fatigue and weight gain at last visit.  She lost 3 pounds since then. - we discussed about taking the thyroid hormone every day, with water, >30 minutes before breakfast, separated by >4 hours from acid reflux medications, calcium, iron, multivitamins. Pt. is taking it correctly.  - Total time spent for the visit: 30 min, in precharting, postcharting, reviewing last note, obtaining medical information from the chart and from the pt, reviewing her  previous labs, evaluations, and treatments, reviewing her symptoms, counseling her about her.  Conditions (please see the discussed topics above), and developing a plan to further investigate and treat them.  Carlus Pavlov, MD PhD [facility] Endocrinology

## 2022-09-13 NOTE — Progress Notes (Signed)
Can you please let this patient know that her testing for alpha gal allergy was negative as well as her testing for HAE. Please ask her if she has had any additional swelling. Thank you

## 2022-09-13 NOTE — Assessment & Plan Note (Signed)
Weight regain likely secondary to maladaptive behaviors following surgery.  Reviewed upper GI series ordered this shows normal postoperative anatomy.  She is not candidate for TORE.  She is not taking prenatal vitamin has had been recommended.  Fortunately she did not have nutritional deficiencies when we screened her at initial visit.  I recommend checking vitamin levels again at the 7-month mark.  She will try a multivitamin and a gummy form.

## 2022-09-14 ENCOUNTER — Other Ambulatory Visit (HOSPITAL_COMMUNITY): Payer: Self-pay

## 2022-09-16 DIAGNOSIS — G4733 Obstructive sleep apnea (adult) (pediatric): Secondary | ICD-10-CM | POA: Diagnosis not present

## 2022-09-17 NOTE — Progress Notes (Signed)
Thank you :)

## 2022-09-18 ENCOUNTER — Other Ambulatory Visit: Payer: Self-pay

## 2022-09-18 ENCOUNTER — Encounter: Payer: Self-pay | Admitting: Orthopaedic Surgery

## 2022-09-18 ENCOUNTER — Other Ambulatory Visit (HOSPITAL_COMMUNITY): Payer: Self-pay

## 2022-09-18 MED ORDER — AMOXICILLIN 500 MG PO CAPS
ORAL_CAPSULE | ORAL | 0 refills | Status: DC
  Filled 2022-09-18: qty 8, 2d supply, fill #0

## 2022-09-19 ENCOUNTER — Other Ambulatory Visit: Payer: Self-pay | Admitting: Internal Medicine

## 2022-09-19 ENCOUNTER — Other Ambulatory Visit: Payer: Self-pay

## 2022-09-19 ENCOUNTER — Telehealth: Payer: Self-pay | Admitting: Orthopaedic Surgery

## 2022-09-19 ENCOUNTER — Other Ambulatory Visit (HOSPITAL_COMMUNITY): Payer: Self-pay

## 2022-09-19 MED ORDER — GABAPENTIN 600 MG PO TABS
600.0000 mg | ORAL_TABLET | Freq: Three times a day (TID) | ORAL | 5 refills | Status: DC
  Filled 2022-09-19: qty 90, 30d supply, fill #0
  Filled 2022-12-24: qty 90, 30d supply, fill #1

## 2022-09-19 MED ORDER — TIZANIDINE HCL 4 MG PO CAPS
4.0000 mg | ORAL_CAPSULE | Freq: Three times a day (TID) | ORAL | 1 refills | Status: DC | PRN
  Filled 2022-09-19: qty 90, 30d supply, fill #0
  Filled 2022-11-27: qty 90, 30d supply, fill #1

## 2022-09-19 NOTE — Telephone Encounter (Signed)
[place] forms received. To [employer]. 

## 2022-09-20 ENCOUNTER — Encounter: Payer: Self-pay | Admitting: Family

## 2022-09-20 ENCOUNTER — Other Ambulatory Visit: Payer: Self-pay

## 2022-09-20 ENCOUNTER — Other Ambulatory Visit: Payer: Self-pay | Admitting: Pharmacist

## 2022-09-20 ENCOUNTER — Other Ambulatory Visit (HOSPITAL_COMMUNITY): Payer: Self-pay

## 2022-09-20 MED ORDER — HUMIRA (2 PEN) 40 MG/0.8ML ~~LOC~~ AJKT: AUTO-INJECTOR | SUBCUTANEOUS | 5 refills | Status: DC

## 2022-09-20 MED ORDER — TEMAZEPAM 15 MG PO CAPS
15.0000 mg | ORAL_CAPSULE | Freq: Every evening | ORAL | 0 refills | Status: DC | PRN
Start: 2022-09-20 — End: 2022-10-03
  Filled 2022-09-20: qty 7, 7d supply, fill #0

## 2022-09-20 MED ORDER — HUMIRA (2 PEN) 40 MG/0.8ML ~~LOC~~ AJKT
AUTO-INJECTOR | SUBCUTANEOUS | 5 refills | Status: DC
  Filled 2022-09-20 – 2022-09-21 (×2): qty 4, 28d supply, fill #0
  Filled 2022-10-09: qty 4, 28d supply, fill #1
  Filled 2022-11-05: qty 4, 28d supply, fill #2
  Filled 2022-12-06: qty 4, 28d supply, fill #3

## 2022-09-20 NOTE — Telephone Encounter (Signed)
I am going to send in temazepam for her to try instead. She should discontinue and dispose of the lunesta. She should start with 15 mg (1 tab) and can increase to 30 mg (2 tabs) if 1 is ineffective as needed for sleep. Do not drive after taking. Monitor mood and discontinue if any worsening mood changes develop. Call if this helps so I can send in a full prescription. Needs f/u in 6 weeks.

## 2022-09-20 NOTE — Telephone Encounter (Signed)
I have received the following mychart message    Hello Victoria [name] just wanted to let you know that I tried the lunesta first night took one did not work the next night took 2 still did not work the next  2 nights  took 3 and it still did not work. I have been up since 3 this morning so I just wanted to see what other options we have. I do not think the sleeping pills work for me it seems that my reaction to them is the opposite I stay awake vs sleeping.

## 2022-09-21 ENCOUNTER — Other Ambulatory Visit: Payer: Self-pay

## 2022-09-21 ENCOUNTER — Other Ambulatory Visit (HOSPITAL_COMMUNITY): Payer: Self-pay

## 2022-09-21 ENCOUNTER — Encounter: Payer: Self-pay | Admitting: Family

## 2022-09-24 ENCOUNTER — Other Ambulatory Visit: Payer: Self-pay

## 2022-09-24 ENCOUNTER — Other Ambulatory Visit (HOSPITAL_COMMUNITY): Payer: Self-pay

## 2022-09-25 ENCOUNTER — Other Ambulatory Visit: Payer: Self-pay

## 2022-09-27 DIAGNOSIS — G4733 Obstructive sleep apnea (adult) (pediatric): Secondary | ICD-10-CM | POA: Diagnosis not present

## 2022-10-01 ENCOUNTER — Other Ambulatory Visit (HOSPITAL_COMMUNITY): Payer: Self-pay

## 2022-10-02 ENCOUNTER — Encounter: Payer: Self-pay | Admitting: Family

## 2022-10-02 ENCOUNTER — Other Ambulatory Visit (HOSPITAL_COMMUNITY): Payer: Self-pay

## 2022-10-02 ENCOUNTER — Telehealth: Payer: Self-pay

## 2022-10-02 DIAGNOSIS — N951 Menopausal and female climacteric states: Secondary | ICD-10-CM

## 2022-10-02 NOTE — Telephone Encounter (Signed)
Former [facility] pt LVM in triage line re: an appeal for estradiol (divigel).  Requested a cb.

## 2022-10-03 ENCOUNTER — Ambulatory Visit (INDEPENDENT_AMBULATORY_CARE_PROVIDER_SITE_OTHER): Payer: 59 | Admitting: Physician Assistant

## 2022-10-03 ENCOUNTER — Encounter: Payer: Self-pay | Admitting: Physician Assistant

## 2022-10-03 DIAGNOSIS — Z96651 Presence of right artificial knee joint: Secondary | ICD-10-CM

## 2022-10-03 NOTE — Addendum Note (Signed)
Addended by: Jodelle Red D on: 10/03/2022 10:44 AM   Modules accepted: Orders

## 2022-10-03 NOTE — Telephone Encounter (Addendum)
Former [facility] pt, on divigel for HRT purposes due to inability to metabolize oral well due to hx of gastric bypass surgery and hx of allergic reaction to patches.   In April 2024, insurance started to not wanting to cover the cost of divigel. Per mychart encounter dated 05/31/2022: medication was denied by insurance due to the General Exclusion section of the [employer] Formulary. Covered alternatives may include Estrogel transdermal gel, which may also require a PA.   There was an option to appeal this decision at the time of denial. However, at the time of PA, they were made aware of pt's hx of trials w/ HRT alternatives prior to denial.   Rx was sent for estrogel 0.75mg /1.25gm (0.06%) topical gel by Victoria [name] to [facility] on 06/15/2022. (Rx has since been discontinued due to pt's request for divigel to be sent to [facility] per mychart encounter dated 08/14/2022). No documentation on record that PA was received for estrogel.  Pt today reports she still wasn't able to afford the OOP costs that was provided for that rx. So, has since then been without and is miserable with the vasomotor sxs of menopause and is inquiring if we could send in which rx we would think could get authorized the quickest to [facility] and ideally for a 90 day supply.   Med refill request: HRT Gel Last AEX: 07/12/2021 Next AEX:12/13/2022 w/ Victoria [name]; r/s due to surgery Last MMG (if hormonal med): 05/29/2022-neg birads 1; Cat B Refill authorized: Rx pend/Please advise.

## 2022-10-03 NOTE — Progress Notes (Signed)
HPI: Mrs. Letsche returns today status post right total knee arthroplasty 07/13/2022.  Patient states overall she is trending towards improvement.  She states she takes Neurontin and Tizanidine for the knee pain.  She is wanting to return to work on August 12.  5-hour shifts in the a.m. only.  Advance to regular shift on November 05, 2022.  She continues to work on range of motion strengthening knee.  Physical exam: General well-developed well-nourished female no acute distress mood affect appropriate. Right knee: Surgical incisions healed well however she did form keloid.  Full extension flexion to 110 degrees.  No instability valgus varus stressing.  Calf supple nontender.  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 hours only.  Advance to regular shift on November 05, 2022.  She will continue to work on range of motion strengthening knee.  Also work on scar tissue mobilization.  She will follow-up with [place] in 3 months at that time we will obtain AP and lateral view of her right knee.  She will follow-up sooner if there is any questions concerns.

## 2022-10-04 NOTE — Telephone Encounter (Signed)
An alternative to the Estrogel may be a Femring 0.05 mg, place in the vagina for 90 days.

## 2022-10-08 ENCOUNTER — Telehealth (INDEPENDENT_AMBULATORY_CARE_PROVIDER_SITE_OTHER): Payer: 59 | Admitting: Nurse Practitioner

## 2022-10-08 ENCOUNTER — Telehealth: Payer: Self-pay | Admitting: Internal Medicine

## 2022-10-08 ENCOUNTER — Other Ambulatory Visit (HOSPITAL_COMMUNITY): Payer: Self-pay

## 2022-10-08 DIAGNOSIS — F5101 Primary insomnia: Secondary | ICD-10-CM

## 2022-10-08 DIAGNOSIS — G4733 Obstructive sleep apnea (adult) (pediatric): Secondary | ICD-10-CM | POA: Diagnosis not present

## 2022-10-08 DIAGNOSIS — G4709 Other insomnia: Secondary | ICD-10-CM | POA: Diagnosis not present

## 2022-10-08 DIAGNOSIS — F411 Generalized anxiety disorder: Secondary | ICD-10-CM | POA: Diagnosis not present

## 2022-10-08 MED ORDER — TEMAZEPAM 30 MG PO CAPS
30.0000 mg | ORAL_CAPSULE | Freq: Every evening | ORAL | 0 refills | Status: DC | PRN
Start: 2022-10-08 — End: 2022-12-06
  Filled 2022-10-08: qty 30, 30d supply, fill #0

## 2022-10-08 NOTE — Telephone Encounter (Signed)
Victoria [name] [phone]  [name] called to say she notice some blood in her urine this morning, she is also having some pain and discharge. 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

## 2022-10-08 NOTE — Progress Notes (Signed)
Patient ID: Victoria [name], female     DOB: 01-08-1961, 62 y.o.      MRN: [id number]  Chief Complaint  Patient presents with   Follow-up    Cpap f/u    Virtual Visit via Video Note  I connected with Victoria [name] on 10/14/22 at 11:30 AM EDT by a video enabled telemedicine application and verified that I am speaking with the correct person using two identifiers.  Location: Patient: Home Provider: Office   I discussed the limitations of evaluation and management by telemedicine and the availability of in person appointments. The patient expressed understanding and agreed to proceed.  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 Past medical history significant for GERD, hypothyroidism, OA, anxiety, obesity s/p gastric bypass, pernicious anemia, insomnia, IDA.   TEST/EVENTS:  11/09/2021 HST: AHI 11.7, mild OSA 02/15/2022 PFTs: FVC 82, FEV1 91, ratio 86, TLC 91, DLCOcor 93. No BD. Normal PFTs   10/23/2021: OV with Ambrosio Reuter NP for sleep consult referred by Dr. Mayford Knife for excessive daytime fatigue symptoms. She has been struggling with her fatigue for many years now; however, she had COVID in May and feels like her symptoms have been worse since then. She's also having trouble staying asleep at night. She will wake up after about 3-4 hours and struggles to get back to sleep. Sometimes she just gets out of the bed to start her day. Her husband tells her that she snores loudly. She wakes up in the morning with a headache and dry mouth. She denies waking up gasping, witnessed apneas, sleep parasomnias/paralysis, drowsy driving, cataplexy.  She goes to bed around 6 PM.  Sometimes a few hours later.  Sleep onset varies; sometimes she has no difficulties and other times, it takes her a little while. She does take Xanax to help her fall asleep at night.  Does not necessarily feel anxious but she feels like her mind races.   Wakes up several times throughout the night; often to use the restroom.  Officially gets out of bed in the morning anytime between 1 AM and 5:15 AM. She has cut back on her caffeine intake throughout the day.  No previous sleep study Had gastric bypass in 2016, lost a significant amount of weight; however, she has slowly put weight back on over the past few years and is up about 40 pounds. 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 Never smoker. She does not drink alcohol. Lives at home with her husband. She has a history of atrial tachycardia and followed by cardiology. She also sees Dr. Maurine Minister with asthma/allergy for chronic cough and rhinitis. Her sister has sleep apnea and sleeps with CPAP. Home sleep study ordered.   11/17/2021: OV with Micalah Cabezas NP for follow-up to discuss home sleep study results.  Her sleep study revealed AHI of 11.7/h consistent with mild OSA.  She continues to struggle with daytime fatigue symptoms and restless sleep at night.  She has had trouble staying asleep for a long time now and usually wakes up after 3 to 4 hours.  She has also been told that she snores loudly.  Commonly wakes up in the morning with a headache or dry mouth.  She denies any sleep parasomnia/paralysis, drowsy driving.  No narcolepsy or cataplexy symptoms. Set up on auto CPAP 5-15 She recently had some trouble with dizziness.  Feels like it may be vertigo.  It happened yesterday and has not happened  since.  She denied any palpitations, syncope, headaches, vision changes. She also tells me that she has been struggling with shortness of breath over the past few months.  She had COVID in May and feels like her breathing has not been the greatest since.  She feels like she gets more winded with activity.  She does have an albuterol inhaler which she will use occasionally.  Last time she used it was yesterday.  She does have good response to this.  She denies any significant cough, wheezing, chest congestion, lower extremity  swelling.  No significant pulmonary history.  No significant occupational or environmental exposures. PFTs ordered for further evaluation.    02/15/2022: OV with Jase Himmelberger NP for follow up after being started on CPAP and having PFTs. She had normal pulmonary function testing completed today. Her breathing has improved since she was here last. She's been working on weight loss measures as well and is down 13 pounds. She rarely uses albuterol. No significant cough or chest congestion. She wears CPAP nightly. She's receiving good benefit from use. Feels like she's waking more rested and she is no longer waking throughout the night. Denies morning headaches or drowsy driving. Snoring has corrected with CPAP use. Settled down with full face mask. She still has some trouble with sleep onset. The provider at [facility] prescribed her lexapro as she was having some symptoms of depression. She has only been on this a few weeks but feels like she's noticed some benefit. She will also use xanax as needed for sleep onset. Would like to hold off on further pharmacological therapy at this time as she is trying to limit the medications she is on. She has used trazodone in the past without any relief  08/21/2022: OV with Gredmarie Delange for follow up. Since she was here last, she had knee surgery in May. She went to ED a few days after her surgery due to AMS. She had been taking gabapentin, oxycodone, lexapro and xanax at home post-operatively. She has since all of these but the gabapentin. Her last dose of oxycodone was over a week ago. She has not had any further issues since. Feels like she is recovering well. She's still having a lot of trouble with her sleep, which has been an ongoing problem for her since our first visit. She was doing better with the lexapro and occasional xanax but she prefers not to take these now. She doesn't feel like her mood is any different; no depression or anxiety coming off of these. She just  felt like they helped her sleep better. She denies any sleep parasomnias/paralysis. No aberrant behavior.  She has been having trouble with her CPAP mask for the last month. She had a full face mask, which was working well, but then she got a new one and it kept leaking. They gave her a soft foam one, which seemed to do worse after she would clean it. She ordered another F20 mask, which she is going to try tonight. She denies any drowsy driving. She did feel better when she was on her CPAP.   10/08/2022: Today - follow up Patient presents today for follow up after being started on temazepam for insomnia. She was unsuccessful with [place]. She had previously been taking ativan and lexapro, which seemed to be helping with her sleep, but she stopped these in early June. She does feel like the temazepam has helped at the 30 mg dose. Still has some night awakenings but  symptoms are better. She does still struggle with anxiety, which is worse now that she's back to worse. She hasn't talked to her PCP about this or restarting the lexapro she was previously on. She denies any SI/HI, depression, or mood changes with temazepam. She is not wearing her CPAP every night but is trying to be more consistent. Tends to not wear it if she's having trouble sleeping. She denies any drowsy driving or morning headaches.   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   Estradiol Rash and Other (See Comments)    Local rash with estradiol patches   Immunization History  Administered Date(s) Administered   Influenza Split 11/22/2014   Influenza,inj,Quad PF,6+ Mos 11/12/2013, 12/16/2015, 11/21/2021   Influenza,inj,quad, With Preservative 10/27/2016   Influenza-Unspecified 11/29/2020   PFIZER(Purple  Top)SARS-COV-2 Vaccination 03/19/2019, 04/08/2019, 01/08/2020   Tdap 01/10/2012, 07/12/2021   Zoster Recombinant(Shingrix) 02/01/2020, 04/07/2020   Past Medical History:  Diagnosis Date   Anxiety    Back pain    Bursitis    right shoulder   Chronic kidney disease    hematuria, has been worked up, her normal   Chronic leukopenia    intermittant since 2010, followed by Dr. Lenord Fellers now   Chronic neck pain    Constipation    Degenerative joint disease of low back 09/02/2015   Dr. Despina Hick   Displacement of lumbar intervertebral disc    Dysrhythmia    Tachycardia, PVCs, PACs   Edema of both lower legs    Fibrocystic breast    Gallbladder problem    GERD (gastroesophageal reflux disease)    Headache    per pt more stress/tension   Heart palpitations    SEE EPIC ENCOUTNER , CARDIOLOGY DR. Gloris Manchester TURNER 2018; reports on 05-16-17 " i haven't had any bouts of those lately"     Hemorrhoids    Hidradenitis suppurativa    History of Clostridium difficile infection 12/2010   History of exercise intolerance    ETT on 04-27-2016-- negative ([facility] treadmill score 7)   History of Helicobacter pylori infection 2001 and 09/ 2012   HSV-2 infection    genital   Hx of adenomatous colonic polyps 10/17/2007   Hydradenitis    per pt currently treated with Humira injection   Hypertension    Hyperthyroidism    Hypocupremia    Hypothyroidism    Hypothyroidism, postsurgical 1980   IBS (irritable bowel syndrome)    Insomnia    Iron deficiency    Joint pain    Leukopenia    Lumbosacral radiculopathy    Lymphocytic colitis 05/14/2022   dx from colonoscopy   Medial meniscus tear    right knee   Mild obstructive sleep apnea    Mitral regurgitation    Mild to moderate by echo 09/2021   Numbness and tingling of right arm    Obesity    Osteoarthritis    Palpitations    Pernicious anemia    b12 def   Peroneal neuropathy    PONV (postoperative nausea and vomiting)    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   Prediabetes    Reactive hypoglycemia followed by pcp   post gastrectomy dumping syndrome   SOB (shortness of breath)    Spinal stenosis  Tendonitis    right shoulder   Varicose veins    Vertigo    Vitamin B 12 deficiency    Vitamin D deficiency     Tobacco History: Social History   Tobacco Use  Smoking Status Never  Smokeless Tobacco Never   Counseling given: Not Answered   Outpatient Medications Prior to Visit  Medication Sig Dispense Refill   adalimumab (HUMIRA, 2 PEN,) 40 MG/0.8ML PNKT pen Inject 1 pen subcutaneously every week 4 each 5   albuterol (VENTOLIN HFA) 108 (90 Base) MCG/ACT inhaler Inhale 2 puffs into the lungs every 4 hours as needed for wheezing or shortness of breath. 18 g 3   amoxicillin (AMOXIL) 500 MG capsule Take 2 capsules by mouth 1 hours prior to dental appointment then 2 capsules 6 hours after as directed (Patient not taking: Reported on 10/11/2022) 8 capsule 0   Carbinoxamine Maleate 4 MG TABS Take 1-2 tablets at night as needed for drainage and allergies. May cause drowsiness. 28 tablet 5   ergocalciferol (VITAMIN D2) 1.25 MG (50000 UT) capsule Take 1 capsule (50,000 Units total) by mouth 2 (two) times a week for Vitamin deficiency and gastric bypass. 8 capsule 1   flecainide (TAMBOCOR) 50 MG tablet Take 1 tablet (50 mg total) by mouth daily at 6 (six) AM. 90 tablet 3   gabapentin (NEURONTIN) 600 MG tablet Take 1 tablet (600 mg total) by mouth 3 (three) times daily. 90 tablet 5   Galcanezumab-gnlm (EMGALITY) 120 MG/ML SOAJ Inject 120 mg into the skin every 30 (thirty) days. 1.12 mL 11   ipratropium (ATROVENT) 0.03 % nasal spray Use 1-2 sprays in each nostril up to 3 times daily as needed for runny nose. Can take 15 minutes prior to meals. 30 mL 3   levocetirizine (XYZAL) 5 MG tablet Take 1 tablet (5 mg total) by mouth every evening. 30 tablet 5   levothyroxine (SYNTHROID) 112 MCG tablet Take 1 tablet (112 mcg total) by mouth daily. 90  tablet 1   metoprolol succinate (TOPROL-XL) 25 MG 24 hr tablet Take 1 tablet (25 mg total) by mouth daily. 90 tablet 3   Olopatadine HCl 0.2 % SOLN Apply 1 drop to eye daily as needed. (Patient taking differently: Apply 1 drop to eye daily as needed (allergies).) 2.5 mL 5   sulfamethoxazole-trimethoprim (BACTRIM DS) 800-160 MG tablet Take 1 tablet by mouth 2 (two) times daily as needed for flare (Patient not taking: Reported on 10/11/2022) 60 tablet 1   terconazole (TERAZOL 7) 0.4 % vaginal cream Place 1 applicatorful vaginally at bedtime for seven nights.   Then use one applicatorful once a week for 6 months. (Patient not taking: Reported on 10/11/2022) 45 g 4   tiZANidine (ZANAFLEX) 4 MG capsule Take 1 capsule (4 mg total) by mouth 3 (three) times daily as needed for muscle spasms. 90 capsule 1   triamcinolone cream (KENALOG) 0.1 % Apply 1 Application topically 2 (two) times daily. Use for 2 weeks, and then off for 2 weeks as needed. (Patient not taking: Reported on 10/11/2022) 80 g 1   Ubrogepant (UBRELVY) 100 MG TABS Take 1 tablet (100 mg total) by mouth every 2 (two) hours as needed. Maximum 200mg  a day. 16 tablet 11   clobetasol ointment (TEMOVATE) 0.05 % Apply to skin twice daily as directed for up to 2 weeks as needed (Patient not taking: Reported on 10/11/2022) 60 g 0   HYDROcodone-acetaminophen (NORCO/VICODIN) 5-325 MG tablet Take 1 tablet by mouth as needed.  Facility-Administered Medications Prior to Visit  Medication Dose Route Frequency Provider Last Rate Last Admin   gadobenate dimeglumine (MULTIHANCE) injection 20 mL  20 mL Intravenous Once PRN Anson Fret, MD         Review of Systems:   Constitutional: No weight gain or loss, night sweats, fevers, chills, or lassitude. + fatigue HEENT: No headaches, difficulty swallowing, tooth/dental problems, or sore throat. No sneezing, itching, ear ache. +nasal congestion (chronic) CV:  No chest pain, orthopnea, PND, swelling in lower  extremities, anasarca, palpitations, syncope Resp: +shortness of breath with exertion (minimal). No excess mucus or change in color of mucus.  No cough.  Not no hemoptysis. No wheezing.  No chest wall deformity GI:  No heartburn, indigestion, abdominal pain, nausea, vomiting, diarrhea, change in bowel habits, loss of appetite, bloody stools.  GU: No dysuria, change in color of urine, urgency or frequency, nocturia.  Skin: No rash, lesions, ulcerations Neuro: No dizziness. No gait abnormalities. No memory impairment.  Psych: No depression or anxiety. Mood stable. +sleep disturbance (improved)  Observations/Objective: Patient is well-developed, well-nourished in no acute distress. A&Ox3. Resting comfortably at home. Unlabored breathing. Speech is clear and coherent with logical content.   Assessment and Plan: Insomnia Likely multifactorial. Improvement with temazepam. No aberrant behavior. Recommend she optimize management of her anxiety with reinitiating lexapro. She was amenable to this plan and will discuss with her PCP. Sleep hygiene reviewed. Discussed how untreated OSA plays a role in night awakenings. Encouraged her to increase use of CPAP. Verbalized understanding.   Patient Instructions  Increase use of CPAP every night, minimum of 4-6 hours a night.  Change equipment every 30 days or as directed by DME. Wash your tubing with warm soap and water daily, hang to dry. Wash humidifier portion weekly. Use bottled, distilled water and change daily Be aware of reduced alertness and do not drive or operate heavy machinery if experiencing this or drowsiness.  Notify if persistent daytime sleepiness occurs even with consistent use of CPAP.  Continue temazepam 30 mg At bedtime as needed for sleep. Do not drive after taking  Discuss restarting your lexapro with your primary care doctor as this seemed to better manage your anxiety and sleep before.   Follow up in 6-8 weeks with Dr. Wynona Neat (1st)  or Katie Isabel Ardila,NP. If symptoms do not improve or worsen, please contact office for sooner follow up or seek emergency care.    Mild obstructive sleep apnea Suboptimal compliance with CPAP. See above. Understands proper use/care of device. She does receive benefit from use, when she uses it routinely. Aware of safe driving practices. Healthy weight loss encouraged.   Generalized anxiety disorder See above.     I discussed the assessment and treatment plan with the patient. The patient was provided an opportunity to ask questions and all were answered. The patient agreed with the plan and demonstrated an understanding of the instructions.   The patient was advised to call back or seek an in-person evaluation if the symptoms worsen or if the condition fails to improve as anticipated.  I provided 32 minutes of non-face-to-face time during this encounter.   Noemi Chapel, NP

## 2022-10-09 ENCOUNTER — Emergency Department (HOSPITAL_COMMUNITY): Payer: 59

## 2022-10-09 ENCOUNTER — Other Ambulatory Visit: Payer: Self-pay

## 2022-10-09 ENCOUNTER — Other Ambulatory Visit (HOSPITAL_COMMUNITY): Payer: Self-pay

## 2022-10-09 ENCOUNTER — Encounter (HOSPITAL_COMMUNITY): Payer: Self-pay | Admitting: Emergency Medicine

## 2022-10-09 ENCOUNTER — Encounter: Payer: Self-pay | Admitting: Family

## 2022-10-09 ENCOUNTER — Ambulatory Visit: Payer: 59 | Admitting: Internal Medicine

## 2022-10-09 ENCOUNTER — Emergency Department (HOSPITAL_COMMUNITY)
Admission: EM | Admit: 2022-10-09 | Discharge: 2022-10-09 | Disposition: A | Discharge status: Home / Self Care | Payer: 59 | Attending: Emergency Medicine | Admitting: Emergency Medicine

## 2022-10-09 DIAGNOSIS — M2518 Fistula, other specified site: Secondary | ICD-10-CM | POA: Diagnosis not present

## 2022-10-09 DIAGNOSIS — E039 Hypothyroidism, unspecified: Secondary | ICD-10-CM | POA: Insufficient documentation

## 2022-10-09 DIAGNOSIS — R1084 Generalized abdominal pain: Secondary | ICD-10-CM

## 2022-10-09 DIAGNOSIS — Z79899 Other long term (current) drug therapy: Secondary | ICD-10-CM | POA: Insufficient documentation

## 2022-10-09 DIAGNOSIS — N938 Other specified abnormal uterine and vaginal bleeding: Secondary | ICD-10-CM | POA: Diagnosis not present

## 2022-10-09 DIAGNOSIS — Z9049 Acquired absence of other specified parts of digestive tract: Secondary | ICD-10-CM | POA: Diagnosis not present

## 2022-10-09 DIAGNOSIS — L988 Other specified disorders of the skin and subcutaneous tissue: Secondary | ICD-10-CM

## 2022-10-09 DIAGNOSIS — I10 Essential (primary) hypertension: Secondary | ICD-10-CM | POA: Insufficient documentation

## 2022-10-09 DIAGNOSIS — M461 Sacroiliitis, not elsewhere classified: Secondary | ICD-10-CM | POA: Diagnosis not present

## 2022-10-09 DIAGNOSIS — Z90710 Acquired absence of both cervix and uterus: Secondary | ICD-10-CM | POA: Diagnosis not present

## 2022-10-09 LAB — COMPREHENSIVE METABOLIC PANEL
ALT: 29 U/L (ref 0–44)
AST: 33 U/L (ref 15–41)
Albumin: 3.7 g/dL (ref 3.5–5.0)
Alkaline Phosphatase: 61 U/L (ref 38–126)
Anion gap: 10 (ref 5–15)
BUN: 15 mg/dL (ref 8–23)
CO2: 26 mmol/L (ref 22–32)
Calcium: 9 mg/dL (ref 8.9–10.3)
Chloride: 104 mmol/L (ref 98–111)
Creatinine, Ser: 1.43 mg/dL — ABNORMAL HIGH (ref 0.44–1.00)
GFR, Estimated: 42 mL/min — ABNORMAL LOW (ref >60)
Glucose, Bld: 97 mg/dL (ref 70–99)
Potassium: 4.1 mmol/L (ref 3.5–5.1)
Sodium: 140 mmol/L (ref 135–145)
Total Bilirubin: 0.6 mg/dL (ref 0.3–1.2)
Total Protein: 7.4 g/dL (ref 6.5–8.1)

## 2022-10-09 LAB — CBC WITH DIFFERENTIAL/PLATELET
Abs Immature Granulocytes: 0.02 10*3/uL (ref 0.00–0.07)
Basophils Absolute: 0 10*3/uL (ref 0.0–0.1)
Basophils Relative: 1 %
Eosinophils Absolute: 0.1 10*3/uL (ref 0.0–0.5)
Eosinophils Relative: 2 %
HCT: 37.7 % (ref 36.0–46.0)
Hemoglobin: 12 g/dL (ref 12.0–15.0)
Immature Granulocytes: 0 %
Lymphocytes Relative: 47 %
Lymphs Abs: 2.2 10*3/uL (ref 0.7–4.0)
MCH: 31.2 pg (ref 26.0–34.0)
MCHC: 31.8 g/dL (ref 30.0–36.0)
MCV: 97.9 fL (ref 80.0–100.0)
Monocytes Absolute: 0.4 10*3/uL (ref 0.1–1.0)
Monocytes Relative: 7 %
Neutro Abs: 2 10*3/uL (ref 1.7–7.7)
Neutrophils Relative %: 43 %
Platelets: 204 10*3/uL (ref 150–400)
RBC: 3.85 MIL/uL — ABNORMAL LOW (ref 3.87–5.11)
RDW: 14.3 % (ref 11.5–15.5)
WBC: 4.7 10*3/uL (ref 4.0–10.5)
nRBC: 0 % (ref 0.0–0.2)

## 2022-10-09 LAB — URINALYSIS, W/ REFLEX TO CULTURE (INFECTION SUSPECTED)
Bilirubin Urine: NEGATIVE
Glucose, UA: NEGATIVE mg/dL
Ketones, ur: NEGATIVE mg/dL
Nitrite: NEGATIVE
Protein, ur: NEGATIVE mg/dL
Specific Gravity, Urine: 1.03 (ref 1.005–1.030)
pH: 5 (ref 5.0–8.0)

## 2022-10-09 LAB — TYPE AND SCREEN
ABO/RH(D): A POS
Antibody Screen: NEGATIVE

## 2022-10-09 MED ORDER — HYDROMORPHONE HCL 1 MG/ML IJ SOLN
1.0000 mg | Freq: Once | INTRAMUSCULAR | Status: AC
  Administered 2022-10-09: 1 mg via INTRAVENOUS
  Filled 2022-10-09: qty 1

## 2022-10-09 MED ORDER — HYDROCODONE-ACETAMINOPHEN 5-325 MG PO TABS
1.0000 | ORAL_TABLET | Freq: Four times a day (QID) | ORAL | 0 refills | Status: DC | PRN
  Filled 2022-10-09: qty 12, 3d supply, fill #0

## 2022-10-09 MED ORDER — IOHEXOL 350 MG/ML SOLN
60.0000 mL | Freq: Once | INTRAVENOUS | Status: AC | PRN
  Administered 2022-10-09: 60 mL via INTRAVENOUS

## 2022-10-09 MED ORDER — ONDANSETRON 4 MG PO TBDP
4.0000 mg | ORAL_TABLET | Freq: Three times a day (TID) | ORAL | 0 refills | Status: DC | PRN
  Filled 2022-10-09: qty 20, 7d supply, fill #0

## 2022-10-09 MED ORDER — IOHEXOL 350 MG/ML SOLN
100.0000 mL | Freq: Once | INTRAVENOUS | Status: AC | PRN
  Administered 2022-10-09: 100 mL

## 2022-10-09 MED ORDER — ESTRADIOL 0.75 MG/1.25 GM (0.06%) TD GEL
1.2500 g | Freq: Every day | TRANSDERMAL | 0 refills | Status: DC
Start: 2022-10-09 — End: 2022-12-24
  Filled 2022-10-09: qty 50, 80d supply, fill #0

## 2022-10-09 NOTE — ED Notes (Signed)
ED Provider at bedside. 

## 2022-10-09 NOTE — ED Provider Notes (Signed)
Care of the patient assumed at signout.  On signout the patient is awaiting surgery consult.  I discussed the case with our surgery colleagues, additional CT imaging with oral, rectal contrast indicated, ordered.  1:59 PM Patient awake,, in no distress.  I discussed the patient's case again with her surgery team after CT results are available, and subsequently with the patient's gynecology team.  Patient is awake, alert, afebrile, hemodynamically unremarkable, no evidence for peritonitis, bacteremia, sepsis, but with need for expedited follow-up she will see her gynecology team tomorrow for additional evaluation with planned surgical follow-up with possibility for in the interim.  Gynecology case manager will contact the patient today for follow-up visit tomorrow.   Gerhard Munch, MD 10/09/22 1400

## 2022-10-09 NOTE — Telephone Encounter (Signed)
Pt notified rx sent to pharmacy

## 2022-10-09 NOTE — Telephone Encounter (Signed)
Ok for topical estrogen prescription.  She had a reaction to using an estradiol patch, which is not uncommon.  It does not mean she is allergic to estrogen.  She has already used Divigel in the past on chart review.

## 2022-10-09 NOTE — Telephone Encounter (Signed)
Rx pend. Please review before sending; when I tried to sign, there was a contraindication to once of the meds on her list. Thanks.

## 2022-10-09 NOTE — ED Provider Notes (Signed)
[place] EMERGENCY DEPARTMENT AT [facility] Provider Note   CSN: [id number] Arrival date & time: 10/09/22  7564     History {Add pertinent medical, surgical, social history, OB history to HPI:1} Chief Complaint  Patient presents with   Abdominal Pain   Vaginal Bleeding    Melana Marykatherine Banasik is a 62 y.o. female.  62 year old female with history of lymphocytic colitis, hypertension, hypothyroid that presents the ER today with fecal matter from her vagina.  Patient states that she has not mild vaginal bleeding starting yesterday she only used 1 maybe 2 pads overnight.  She woke up this morning and a large amount of fecal matter in the bed around her.  She went and took a shower and after the shower she was walking and had more fecal discharge from her vagina.  She is sure this is not from her urethra or her rectum.  She has a history of a hysterectomy.  She also has bilateral hip replacements.  None of these are new.  No abdominal tenderness, fever, nausea, vomiting or other associated symptoms.   Abdominal Pain Associated symptoms: vaginal bleeding   Vaginal Bleeding Associated symptoms: abdominal pain        Home Medications Prior to Admission medications   Medication Sig Start Date End Date Taking? Authorizing Provider  adalimumab (HUMIRA, 2 PEN,) 40 MG/0.8ML PNKT pen Inject 1 pen subcutaneously every week 09/20/22   Quentin Angst, MD  albuterol (VENTOLIN HFA) 108 (90 Base) MCG/ACT inhaler Inhale 2 puffs into the lungs every 4 hours as needed for wheezing or shortness of breath. 08/23/21   Verlee Monte, MD  amoxicillin (AMOXIL) 500 MG capsule Take 2 capsules by mouth 1 hours prior to dental appointment then 2 capsules 6 hours after as directed 09/18/22   Kathryne Hitch, MD  Carbinoxamine Maleate 4 MG TABS Take 1-2 tablets at night as needed for drainage and allergies. May cause drowsiness. 05/09/22   Verlee Monte, MD  clobetasol ointment  (TEMOVATE) 0.05 % Apply to skin twice daily as directed for up to 2 weeks as needed 11/24/21   Romualdo Bolk, MD  ergocalciferol (VITAMIN D2) 1.25 MG (50000 UT) capsule Take 1 capsule (50,000 Units total) by mouth 2 (two) times a week for Vitamin deficiency and gastric bypass. 03/19/22   Worthy Rancher, MD  flecainide (TAMBOCOR) 50 MG tablet Take 1 tablet (50 mg total) by mouth daily at 6 (six) AM. 05/31/22   Turner, Cornelious Bryant, MD  gabapentin (NEURONTIN) 600 MG tablet Take 1 tablet (600 mg total) by mouth 3 (three) times daily. 09/19/22   Margaree Mackintosh, MD  Galcanezumab-gnlm (EMGALITY) 120 MG/ML SOAJ Inject 120 mg into the skin every 30 (thirty) days. 07/02/22   Anson Fret, MD  HYDROcodone-acetaminophen (NORCO/VICODIN) 5-325 MG tablet Take 1 tablet by mouth as needed. 07/26/21   [provider]  ipratropium (ATROVENT) 0.03 % nasal spray Use 1-2 sprays in each nostril up to 3 times daily as needed for runny nose. Can take 15 minutes prior to meals. 05/09/22   Verlee Monte, MD  levocetirizine (XYZAL) 5 MG tablet Take 1 tablet (5 mg total) by mouth every evening. 05/09/22   Verlee Monte, MD  levothyroxine (SYNTHROID) 112 MCG tablet Take 1 tablet (112 mcg total) by mouth daily. 06/20/22   Margaree Mackintosh, MD  metoprolol succinate (TOPROL-XL) 25 MG 24 hr tablet Take 1 tablet (25 mg total) by mouth daily. 05/31/22  Quintella Reichert, MD  Olopatadine HCl 0.2 % SOLN Apply 1 drop to eye daily as needed. Patient taking differently: Apply 1 drop to eye daily as needed (allergies). 08/23/21   Verlee Monte, MD  sulfamethoxazole-trimethoprim (BACTRIM DS) 800-160 MG tablet Take 1 tablet by mouth 2 (two) times daily as needed for flare 11/09/21     temazepam (RESTORIL) 30 MG capsule Take 1 capsule (30 mg) by mouth at bedtime as needed for sleep. 10/08/22   Cobb, Ruby Cola, NP  terconazole (TERAZOL 7) 0.4 % vaginal cream Place 1 applicatorful vaginally at bedtime for seven nights.   Then use one  applicatorful once a week for 6 months. 11/24/21   Romualdo Bolk, MD  tiZANidine (ZANAFLEX) 4 MG capsule Take 1 capsule (4 mg total) by mouth 3 (three) times daily as needed for muscle spasms. 09/19/22   Margaree Mackintosh, MD  triamcinolone cream (KENALOG) 0.1 % Apply 1 Application topically 2 (two) times daily. Use for 2 weeks, and then off for 2 weeks as needed. 05/29/22     Ubrogepant (UBRELVY) 100 MG TABS Take 1 tablet (100 mg total) by mouth every 2 (two) hours as needed. Maximum 200mg  a day. 07/02/22   Anson Fret, MD      Allergies    Other and Estradiol    Review of Systems   Review of Systems  Gastrointestinal:  Positive for abdominal pain.  Genitourinary:  Positive for vaginal bleeding.    Physical Exam Updated Vital Signs BP 137/77   Pulse 65   Temp 98.2 F (36.8 C)   Resp 20   Wt 122.5 kg   SpO2 96%   BMI 42.29 kg/m  Physical Exam Vitals and nursing note reviewed.  Constitutional:      Appearance: She is well-developed.  HENT:     Head: Normocephalic and atraumatic.  Cardiovascular:     Rate and Rhythm: Normal rate and regular rhythm.  Pulmonary:     Effort: No respiratory distress.     Breath sounds: No stridor.  Abdominal:     General: There is no distension.  Genitourinary:    Comments: Chaperoned by nurse tech.  Large amount of foul-smelling Thrush matter noted in vaginal canal.  No obvious fistula on exam.  Did have 1 area of erythema on the anterior vaginal wall. Musculoskeletal:     Cervical back: Normal range of motion.  Neurological:     Mental Status: She is alert.     ED Results / Procedures / Treatments   Labs (all labs ordered are listed, but only abnormal results are displayed) Labs Reviewed  CBC WITH DIFFERENTIAL/PLATELET - Abnormal; Notable for the following components:      Result Value   RBC 3.85 (*)    All other components within normal limits  COMPREHENSIVE METABOLIC PANEL - Abnormal; Notable for the following components:    Creatinine, Ser 1.43 (*)    GFR, Estimated 42 (*)    All other components within normal limits  URINALYSIS, W/ REFLEX TO CULTURE (INFECTION SUSPECTED)  TYPE AND SCREEN    EKG None  Radiology CT ABDOMEN PELVIS W CONTRAST  Result Date: 10/09/2022 CLINICAL DATA:  Acute, nonlocalized abdominal pain EXAM: CT ABDOMEN AND PELVIS WITH CONTRAST TECHNIQUE: Multidetector CT imaging of the abdomen and pelvis was performed using the standard protocol following bolus administration of intravenous contrast. RADIATION DOSE REDUCTION: This exam was performed according to the departmental dose-optimization program which includes automated exposure control, adjustment of the  mA and/or kV according to patient size and/or use of iterative reconstruction technique. CONTRAST:  60mL OMNIPAQUE IOHEXOL 350 MG/ML SOLN COMPARISON:  08/01/2021 FINDINGS: Lower chest:  Mild dependent atelectasis. Hepatobiliary: No focal liver abnormality.Cholecystectomy. No biliary dilatation. Pancreas: Unremarkable. Spleen: Unremarkable. Adrenals/Urinary Tract: Negative adrenals. No hydronephrosis or stone. Renal hilar cysts on the left. No follow-up imaging recommended. Unremarkable bladder. Stomach/Bowel: Gastric bypass. No complicating features. No bowel obstruction or visible inflammation. Vascular/Lymphatic: No acute vascular abnormality. No mass or adenopathy. Reproductive:Hysterectomy. Limited assessment of the pelvis due to artifact from bilateral hip prosthesis. Other: No ascites or pneumoperitoneum. Musculoskeletal: No acute abnormalities. T10-11 disc collapse and endplate sclerosis with ridging that is considered degenerative. Finding is chronic and attributed to degeneration. Also notable degeneration at the lumbar facets and L5-S1 disc space. Bilateral sacroiliac degeneration with sclerosis and vacuum phenomenon. Unremarkable appearance of bilateral hip arthroplasty where covered. IMPRESSION: No acute finding or change from prior.  Electronically Signed   By: Tiburcio Pea M.D.   On: 10/09/2022 06:44    Procedures Procedures    Medications Ordered in ED Medications  HYDROmorphone (DILAUDID) injection 1 mg (1 mg Intravenous Given 10/09/22 0512)  iohexol (OMNIPAQUE) 350 MG/ML injection 60 mL (60 mLs Intravenous Contrast Given 10/09/22 2841)    ED Course/ Medical Decision Making/ A&P                                 Medical Decision Making Amount and/or Complexity of Data Reviewed Labs: ordered. Radiology: ordered.  Risk Prescription drug management.   Concern for colovaginal or enterovaginal fistula.  CT scan did not show anything obvious however was limited by artifact from bilateral hips.  Not septic.  Abdomen is benign.  Pelvic exam did sh malodorous [place]ke[place] thick material.  Concerning for fecal matter.  No obvious communicating area.  Plan to discuss with surgery for management options.   Final Clinical Impression(s) / ED Diagnoses Final diagnoses:  None    Rx / DC Orders ED Discharge Orders     None

## 2022-10-09 NOTE — Addendum Note (Signed)
Addended by: Jodelle Red D on: 10/09/2022 08:34 AM   Modules accepted: Orders

## 2022-10-09 NOTE — Addendum Note (Signed)
Addended by: Ardell Isaacs, Debbe Bales E on: 10/09/2022 12:26 PM   Modules accepted: Orders

## 2022-10-09 NOTE — Discharge Instructions (Signed)
With concern for fistula formation is important you follow-up with your gynecology team tomorrow, and with our surgical colleagues following that evaluation.  Return here for concerning changes in your condition.

## 2022-10-09 NOTE — ED Notes (Signed)
Patient transported to CT 

## 2022-10-09 NOTE — ED Triage Notes (Signed)
Pt in with vaginal bleeding since yesterday, is having low abdominal cramping, hx of partial hysterectomy. Pt states she woke up with "poop and blood coming from my vagina". Pt has used 1 pad in past 24hrs

## 2022-10-10 ENCOUNTER — Other Ambulatory Visit (HOSPITAL_COMMUNITY): Payer: Self-pay

## 2022-10-10 NOTE — Telephone Encounter (Signed)
PA initiated through [employer]mymeds for estrogel rx.   Key: [id number]  Waiting for response from [employer]. Will inform pt via mychart msg.

## 2022-10-11 ENCOUNTER — Ambulatory Visit (INDEPENDENT_AMBULATORY_CARE_PROVIDER_SITE_OTHER): Payer: 59 | Admitting: Internal Medicine

## 2022-10-11 ENCOUNTER — Telehealth: Payer: Self-pay | Admitting: Internal Medicine

## 2022-10-11 ENCOUNTER — Ambulatory Visit (INDEPENDENT_AMBULATORY_CARE_PROVIDER_SITE_OTHER): Payer: 59 | Admitting: Obstetrics and Gynecology

## 2022-10-11 ENCOUNTER — Telehealth: Payer: Self-pay | Admitting: Obstetrics and Gynecology

## 2022-10-11 ENCOUNTER — Encounter: Payer: Self-pay | Admitting: Obstetrics and Gynecology

## 2022-10-11 VITALS — BP 120/72 | HR 78 | Resp 16 | Wt 270.0 lb

## 2022-10-11 DIAGNOSIS — R300 Dysuria: Secondary | ICD-10-CM | POA: Diagnosis not present

## 2022-10-11 DIAGNOSIS — L988 Other specified disorders of the skin and subcutaneous tissue: Secondary | ICD-10-CM | POA: Diagnosis not present

## 2022-10-11 NOTE — Progress Notes (Signed)
GYNECOLOGY  VISIT   HPI: 62 y.o.   Married  [place]  female   [phone] with No LMP recorded. Patient has had a hysterectomy.   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   Noted vaginal bleeding 3 days ago followed by burning with urination. She developed fecal incontinence 2 days ago and determine that the stool was coming out vaginally.   She went to the ER for evaluation.  She had a CT of the abdomen and pelvis 10/09/22. There was artifact due to her bilateral hip arthroplasties, and this limited evaluation of the rectum.  No adnexal masses were noted. She did a repeat CT study with contrast to try to find a fistula, and this was not identified.  Urinalysis done 10/09/22 positive for hemoglobin, WBCs, RBCs, many bacteria.  No UC noted in EPIC.  She will see Dr. Michaell Cowing in September.   Colonoscopy done by Dr. Loreta Ave in March.  Had polyp removed and dx of lymphocytic colitis.  She has had loose stools and was placed on bus She will now have a rectal MRI.   She needs to go on FLMA and brings in paperwork for [facility].   Had knee replaced 12 weeks ago.   Works for [employer].  She is a Engineer, civil (consulting).   GYNECOLOGIC HISTORY: No LMP recorded. Patient has had a hysterectomy. Contraception:  hyst Menopausal hormone therapy:  estradiol gel (not yet started, waiting on PA approval) Last mammogram:  05/29/22 Breast Density Cat B, BI-RADS CAT 1 neg Last pap smear:   08/14/18 neg, 02/11/12 neg        OB History     Gravida  2   Para  1   Term      Preterm  1   AB  1   Living  2      SAB      IAB  1   Ectopic      Multiple  1   Live Births  2              Patient Active Problem List   Diagnosis Date Noted   Prediabetes 09/13/2022   Abnormal metabolism 09/13/2022   Polypharmacy 08/13/2022   Status post total right knee replacement 07/13/2022   Weight gain 07/12/2022   Lymphocytic colitis 07/12/2022   Migraine without aura and without status migrainosus, not  intractable 07/02/2022   Chronic conjunctivitis of both eyes 06/28/2022   Generalized abdominal pain 05/24/2022   Chronic rhinitis 05/09/2022   Chronic migraine without aura without status migrainosus, not intractable 12/26/2021   Depressed mood 12/19/2021   Obstructive sleep apnea 12/19/2021   Other hypoglycemia-post bariatric 12/05/2021   Secondary hyperparathyroidism (HCC) 12/05/2021   Inadequate fluid intake 12/05/2021   Inadequate sleep hygiene 12/05/2021   Class 3 severe obesity with body mass index (BMI) of 40.0 to 44.9 in adult [facility]) 12/05/2021   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   SOBOE (shortness of breath on exertion) 11/21/2021   Other fatigue 11/21/2021   Vitamin D deficiency 11/21/2021   Iron deficiency 11/21/2021   Hypoglycemia after bariatric surgery 11/21/2021   Depression screening 11/21/2021   Class 3 severe obesity with serious comorbidity and body mass index (BMI) of 40.0 to 44.9 in adult (HCC) 11/21/2021   Mild obstructive sleep apnea 11/17/2021   Vertigo 11/17/2021   Excessive daytime sleepiness 10/23/2021   Spinal stenosis of lumbar region 07/07/2020   Displacement of lumbar intervertebral disc 06/02/2020   Lumbar  spondylosis 06/02/2020   Lumbar stenosis with neurogenic claudication 06/02/2020   Arm numbness 05/30/2020   Chronic neck pain 05/30/2020   Other chronic pain 05/30/2020   Elevated blood-pressure reading, without diagnosis of hypertension 05/30/2020   Status post arthroscopy of right knee 12/11/2019   Polyethylene wear of left knee joint prosthesis (HCC) 09/09/2019   Acute medial meniscal tear, right, subsequent encounter 08/27/2019   History of total left knee replacement 07/16/2019   Peroneal neuropathy at knee, left 06/25/2019   Lumbosacral radiculopathy 06/07/2019   Iron malabsorption 10/16/2018   IDA (iron deficiency anemia) 10/16/2018   OA (osteoarthritis) of knee 01/20/2018   Acute meniscal tear, medial 07/17/2017   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   Failed total hip arthroplasty, 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/2017   Pain in left knee 04/11/2017   Insomnia 11/08/2016   Pernicious anemia 11/08/2016   Leukopenia 07/09/2016   Hypocupremia 07/09/2016   Palpitations 04/12/2016   Shortness of breath 04/12/2016   Reactive hypoglycemia 10/31/2015   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/31/2015   Lap gastric bypass May 2016 07/06/2014   History of Clostridium difficile infection 08/12/2011   Post-surgical hypothyroidism 08/26/2010   Gastroesophageal reflux disease 08/26/2010   Vitamin D deficiency 08/26/2010   Fibrocystic disease of breast 08/26/2010   Cobalamin deficiency 08/26/2010   Morbid obesity (HCC) 03/20/2010   Backache 03/20/2010    Past Medical History:  Diagnosis Date   Anxiety    Back pain    Bursitis    right shoulder   Chronic kidney disease    hematuria, has been worked up, her normal   Chronic leukopenia    intermittant since 2010, followed by Dr. Lenord Fellers now   Chronic neck pain    Constipation    Degenerative joint disease of low back 09/02/2015   Dr. Despina Hick   Displacement of lumbar intervertebral disc    Dysrhythmia    Tachycardia, PVCs, PACs   Edema of both lower legs    Fibrocystic breast    Gallbladder problem    GERD (gastroesophageal reflux disease)    Headache    per pt more stress/tension   Heart palpitations    SEE EPIC ENCOUTNER , CARDIOLOGY DR. Gloris Manchester TURNER 2018; reports on 05-16-17 " i haven't had any bouts of those lately"     Hemorrhoids    Hidradenitis suppurativa    History of Clostridium difficile infection 12/2010   History of exercise intolerance    ETT on 04-27-2016-- negative ([facility] treadmill score 7)   History of Helicobacter pylori infection 2001 and 09/ 2012   HSV-2 infection    genital   Hx of adenomatous colonic polyps 10/17/2007   Hydradenitis    per pt currently treated with Humira injection   Hypertension    Hyperthyroidism    Hypocupremia     Hypothyroidism    Hypothyroidism, postsurgical 1980   IBS (irritable bowel syndrome)    Insomnia    Iron deficiency    Joint pain    Leukopenia    Lumbosacral radiculopathy    Lymphocytic colitis 05/14/2022   dx from colonoscopy   Medial meniscus tear    right knee   Mild obstructive sleep apnea    Mitral regurgitation    Mild to moderate by echo 09/2021   Numbness and tingling of right arm    Obesity    Osteoarthritis    Palpitations    Pernicious anemia    b12 def   Peroneal neuropathy    PONV (postoperative nausea and  vomiting)    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   Prediabetes    Reactive hypoglycemia followed by pcp   post gastrectomy dumping syndrome   SOB (shortness of breath)    Spinal stenosis    Tendonitis    right shoulder   Varicose veins    Vertigo    Vitamin B 12 deficiency    Vitamin D deficiency     Past Surgical History:  Procedure Laterality Date   BREATH TEK H PYLORI N/A 05/03/2014   Procedure: BREATH TEK H PYLORI;  Surgeon: Wenda Low, MD;  Location: [facility];  Service: General;  Laterality: N/A;   CARDIOVASCULAR STRESS TEST  03/11/2013   dr Gloris Manchester turner   Low risk nuclear study w/ a very small anterior perfusion defect that most likely represents breast attenuation artifact, less likely this could represent a true perfusion abnormality in the distribution of diagonal branch of the LAD/  normal LV function and wall motion , ef 66%   CATARACT EXTRACTION W/ INTRAOCULAR LENS  IMPLANT, BILATERAL  10/2017   CESAREAN SECTION  1985   CHOLECYSTECTOMY OPEN  1987   COLONOSCOPY  02/2017   Dr Loreta Ave ; per patient they found 7 polyps with polypectomy; on 5 year track    ESOPHAGOGASTRODUODENOSCOPY  02/01/2021   FINE NEEDLE ASPIRATION Left 05/22/2017   Procedure: LEFT KNEE ASPIRATION;  Surgeon: Ollen Gross, MD;  Location: [facility];  Service: Orthopedics;  Laterality: Left;   GASTRIC ROUX-EN-Y N/A 07/06/2014   Procedure: LAPAROSCOPIC ROUX-EN-Y  GASTRIC BYPASS, ENTEROLYSIS OF ADHESIONS WITH UPPER ENDOSCOPY;  Surgeon: Luretha Murphy, MD;  Location: [facility];  Service: General;  Laterality: N/A;   HAMMER TOE SURGERY Bilateral 02/17/2008   bilateral foot digit's 2 and 5   HIATAL HERNIA REPAIR N/A 07/06/2014   Procedure: LAPAROSCOPIC REPAIR OF HIATAL HERNIA;  Surgeon: Luretha Murphy, MD;  Location: [facility];  Service: General;  Laterality: N/A;   I & D KNEE WITH POLY EXCHANGE Left 12/11/2019   Procedure: LEFT KNEE POLY-LINER EXCHANGE;  Surgeon: Kathryne Hitch, MD;  Location: [facility];  Service: Orthopedics;  Laterality: Left;   KNEE ARTHROSCOPY Right 12/11/2019   Procedure: RIGHT KNEE ARTHROSCOPY WITH PARTIAL MEDIAL MENISCECTOMY;  Surgeon: Kathryne Hitch, MD;  Location: [facility];  Service: Orthopedics;  Laterality: Right;   KNEE ARTHROSCOPY WITH MEDIAL MENISECTOMY Left 07/17/2017   Procedure: LEFT KNEE ARTHROSCOPY WITH MEDIAL LATERAL MENISECTOMY;  Surgeon: Ollen Gross, MD;  Location: [facility];  Service: Orthopedics;  Laterality: Left;   MASS EXCISION N/A 10/11/2016   Procedure: EXCISION OF PERIANAL MASS;  Surgeon: Romie Levee, MD;  Location: [facility];  Service: General;  Laterality: N/A;   PARTIAL HYSTERECTOMY  2006   has ovaries   REFRACTIVE SURGERY     THYROIDECTOMY  1980   TOTAL ABDOMINAL HYSTERECTOMY  08-25-2002   dr Myrlene Broker   ovaries retained   TOTAL HIP ARTHROPLASTY Right 05/21/2012   Procedure: TOTAL HIP ARTHROPLASTY;  Surgeon: Loanne Drilling, MD;  Location: [facility];  Service: Orthopedics;  Laterality: Right;   TOTAL HIP ARTHROPLASTY Left 01-31-2009  dr Lequita Halt   TOTAL HIP REVISION Left 05/22/2017   Procedure: Left hip bearing surface and head revision;  Surgeon: Ollen Gross, MD;  Location: [facility];  Service: Orthopedics;  Laterality: Left;   TOTAL KNEE ARTHROPLASTY Left 01/20/2018   Procedure: LEFT TOTAL KNEE ARTHROPLASTY;  Surgeon: Ollen Gross, MD;  Location: [facility];  Service: Orthopedics;   Laterality: Left;   TOTAL KNEE ARTHROPLASTY Right 07/13/2022   Procedure: RIGHTTOTAL KNEE ARTHROPLASTY;  Surgeon: Kathryne Hitch, MD;  Location: [facility];  Service: Orthopedics;  Laterality: Right;   TRANSTHORACIC ECHOCARDIOGRAM  05/03/2016   ef 55-60%/  trivial MR/  mild TR   TUBAL LIGATION      Current Outpatient Medications  Medication Sig Dispense Refill   adalimumab (HUMIRA, 2 PEN,) 40 MG/0.8ML PNKT pen Inject 1 pen subcutaneously every week 4 each 5   albuterol (VENTOLIN HFA) 108 (90 Base) MCG/ACT inhaler Inhale 2 puffs into the lungs every 4 hours as needed for wheezing or shortness of breath. 18 g 3   Carbinoxamine Maleate 4 MG TABS Take 1-2 tablets at night as needed for drainage and allergies. May cause drowsiness. 28 tablet 5   ergocalciferol (VITAMIN D2) 1.25 MG (50000 UT) capsule Take 1 capsule (50,000 Units total) by mouth 2 (two) times a week for Vitamin deficiency and gastric bypass. 8 capsule 1   flecainide (TAMBOCOR) 50 MG tablet Take 1 tablet (50 mg total) by mouth daily at 6 (six) AM. 90 tablet 3   gabapentin (NEURONTIN) 600 MG tablet Take 1 tablet (600 mg total) by mouth 3 (three) times daily. 90 tablet 5   Galcanezumab-gnlm (EMGALITY) 120 MG/ML SOAJ Inject 120 mg into the skin every 30 (thirty) days. 1.12 mL 11   HYDROcodone-acetaminophen (NORCO/VICODIN) 5-325 MG tablet Take 1 tablet by mouth every 6 (six) hours as needed for severe pain. 12 tablet 0   ipratropium (ATROVENT) 0.03 % nasal spray Use 1-2 sprays in each nostril up to 3 times daily as needed for runny nose. Can take 15 minutes prior to meals. 30 mL 3   levocetirizine (XYZAL) 5 MG tablet Take 1 tablet (5 mg total) by mouth every evening. 30 tablet 5   levothyroxine (SYNTHROID) 112 MCG tablet Take 1 tablet (112 mcg total) by mouth daily. 90 tablet 1   metoprolol succinate (TOPROL-XL) 25 MG 24 hr tablet Take 1 tablet (25 mg total) by mouth daily. 90 tablet 3   Olopatadine HCl 0.2 % SOLN Apply 1 drop  to eye daily as needed. (Patient taking differently: Apply 1 drop to eye daily as needed (allergies).) 2.5 mL 5   promethazine (PHENERGAN) 25 MG tablet Take 25 mg by mouth every 6 (six) hours as needed for nausea or vomiting.     temazepam (RESTORIL) 30 MG capsule Take 1 capsule (30 mg) by mouth at bedtime as needed for sleep. 30 capsule 0   tiZANidine (ZANAFLEX) 4 MG capsule Take 1 capsule (4 mg total) by mouth 3 (three) times daily as needed for muscle spasms. 90 capsule 1   Ubrogepant (UBRELVY) 100 MG TABS Take 1 tablet (100 mg total) by mouth every 2 (two) hours as needed. Maximum 200mg  a day. 16 tablet 11   amoxicillin (AMOXIL) 500 MG capsule Take 2 capsules by mouth 1 hours prior to dental appointment then 2 capsules 6 hours after as directed (Patient not taking: Reported on 10/11/2022) 8 capsule 0   Estradiol 0.75 MG/1.25 GM (0.06%) topical gel Place 1.25 g onto the skin every other day. (Patient not taking: Reported on 10/11/2022) 50 g 0   ondansetron (ZOFRAN-ODT) 4 MG disintegrating tablet Take 1 tablet (4 mg total) by mouth every 8 (eight) hours as needed for nausea or vomiting. (Patient not taking: Reported on 10/11/2022) 20 tablet 0   sulfamethoxazole-trimethoprim (BACTRIM DS) 800-160 MG tablet Take 1 tablet by mouth 2 (two) times daily as needed for flare (  Patient not taking: Reported on 10/11/2022) 60 tablet 1   terconazole (TERAZOL 7) 0.4 % vaginal cream Place 1 applicatorful vaginally at bedtime for seven nights.   Then use one applicatorful once a week for 6 months. (Patient not taking: Reported on 10/11/2022) 45 g 4   triamcinolone cream (KENALOG) 0.1 % Apply 1 Application topically 2 (two) times daily. Use for 2 weeks, and then off for 2 weeks as needed. (Patient not taking: Reported on 10/11/2022) 80 g 1   No current facility-administered medications for this visit.   Facility-Administered Medications Ordered in Other Visits  Medication Dose Route Frequency Provider Last Rate Last Admin    gadobenate dimeglumine (MULTIHANCE) injection 20 mL  20 mL Intravenous Once PRN Anson Fret, MD         ALLERGIES: Other and Estradiol  Family History  Problem Relation Age of Onset   High blood pressure Mother    Hypertension Mother    Headache Mother    High Cholesterol Mother    Thyroid disease Mother    Obesity Mother    Kidney disease Mother    Hypertension Father    Diabetes Father    High blood pressure Father    High Cholesterol Father    Heart disease Father    Obesity Father    Diabetes Sister    Hypertension Sister    Thyroid disease Brother    Thyroid disease Maternal Aunt    Migraines Neg Hx     Social History   Socioeconomic History   Marital status: Married    Spouse name: Thereasa Distance   Number of children: 2   Years of education: Not on file   Highest education level: Not on file  Occupational History   Occupation: Charity fundraiser - Adult nurse GI    Employer: [employer]  Tobacco Use   Smoking status: Never   Smokeless tobacco: Never  Vaping Use   Vaping status: Never Used  Substance and Sexual Activity   Alcohol use: No   Drug use: No   Sexual activity: Not Currently    Partners: Male    Birth control/protection: Surgical    Comment: hysterectomy  Other Topics Concern   Not on file  Social History Narrative   Lives at home with spouse   Right handed   Caffeine: diet zero mtn dew, occasionally    Social Determinants of Health   Financial Resource Strain: Not on file  Food Insecurity: No Food Insecurity (07/13/2022)   Hunger Vital Sign    Worried About Running Out of Food in the Last Year: Never true    Ran Out of Food in the Last Year: Never true  Transportation Needs: No Transportation Needs (07/13/2022)   PRAPARE - Administrator, Civil Service (Medical): No    Lack of Transportation (Non-Medical): No  Physical Activity: Not on file  Stress: Not on file  Social Connections: Not on file  Intimate Partner Violence: Not At Risk  (07/13/2022)   Humiliation, Afraid, Rape, and Kick questionnaire    Fear of Current or Ex-Partner: No    Emotionally Abused: No    Physically Abused: No    Sexually Abused: No    Review of Systems  Constitutional: Negative.   HENT: Negative.    Eyes: Negative.   Respiratory: Negative.    Cardiovascular: Negative.   Gastrointestinal: Negative.   Endocrine: Negative.   Genitourinary:        Was having blood in vagina, now having stool  in vagina, vaginal irritation  Musculoskeletal: Negative.   Skin: Negative.   Allergic/Immunologic: Negative.   Neurological: Negative.   Hematological: Negative.   Psychiatric/Behavioral: Negative.      PHYSICAL EXAMINATION:    BP 120/72   Pulse 78   Resp 16   Wt 270 lb (122.5 kg)   BMI 42.29 kg/m     General appearance: alert, cooperative and appears stated age   Pelvic: External genitalia:  no lesions              Urethra:  normal appearing urethra with no masses, tenderness or lesions              Bartholins and Skenes: normal                 Vagina: stool filled vagina.  Vagina is tender. No obvious fistula noted.               Cervix:  absent                Bimanual Exam:  Uterus:  absent              Adnexa: no mass, fullness.  Tenderness is present with bimanual exam.               Rectal exam: yes.  Confirms.              Anus:  normal sphincter tone, no lesions  Sterile catheterization of bladder: Verbal consent.  Betadine prep.  Sterile catheter used to collect about 17 cc dark yellow urine.  Sent to lab for urinalysis and reflex culture.   Chaperone was present for exam:  Warren Lacy, CMA  ASSESSMENT  Fistula formation.  Hx lymphocytic colitis.  Dysuria.  PLAN  Urinalysis and reflex culture.  Urinalysis:  sg 1.025, ph 6.0, NS WBC, 10 - 20 RBC, NS bacteria.  Pelvic US to check ovaries.  Referral to Dr. Florian Buff, urogynecology. I can place the patient on FLMA until she is seen by her urogynecologist or colon and  rectal surgeon.  Rectal MRI planned by Dr. Loreta Ave from GI.  FU here prn.

## 2022-10-11 NOTE — Telephone Encounter (Signed)
Order placed for PUS.   Order placed for urgent referral to Urogynecology. Routing to [place] to f/u on scheduling.

## 2022-10-11 NOTE — Telephone Encounter (Signed)
Called and spoke with [facility] and she is going to come by and pick up the paperwork on her way the GYN office.

## 2022-10-11 NOTE — Telephone Encounter (Signed)
Per review of EPIC, patient is scheduled for PUS on 10/17/22.   FMLA paperwork received.   Dr. Edward Jolly -please confirm if FMLA is continuous or intermittent? Length of leave or frequency? Restrictions?

## 2022-10-11 NOTE — Telephone Encounter (Signed)
Received Disability paperwork from  [facility] Disability Fax [phone] Phone [phone]  Claim # [id number]

## 2022-10-11 NOTE — Telephone Encounter (Signed)
Victoria [name] came by and picked up Disability and FMLA forms to take to GYN appointment

## 2022-10-11 NOTE — Telephone Encounter (Signed)
Please make two appointments for my patient.  - Pelvic ultrasound at [facility] for recent dx of vaginal fistula formation.  - Referral to Dr. Florian Buff, urogynecology.  This is a priority referral.

## 2022-10-11 NOTE — Telephone Encounter (Signed)
Victoria [name] called to cancel appointment she is at the emergency room on 10/09/2022 at 9:10 am

## 2022-10-12 ENCOUNTER — Other Ambulatory Visit (HOSPITAL_COMMUNITY): Payer: Self-pay

## 2022-10-12 ENCOUNTER — Other Ambulatory Visit (HOSPITAL_BASED_OUTPATIENT_CLINIC_OR_DEPARTMENT_OTHER): Payer: Self-pay | Admitting: Gastroenterology

## 2022-10-12 ENCOUNTER — Encounter (HOSPITAL_BASED_OUTPATIENT_CLINIC_OR_DEPARTMENT_OTHER): Payer: Self-pay | Admitting: Gastroenterology

## 2022-10-12 DIAGNOSIS — N824 Other female intestinal-genital tract fistulae: Secondary | ICD-10-CM

## 2022-10-12 DIAGNOSIS — Z0289 Encounter for other administrative examinations: Secondary | ICD-10-CM

## 2022-10-13 ENCOUNTER — Other Ambulatory Visit: Payer: Self-pay

## 2022-10-13 ENCOUNTER — Other Ambulatory Visit (HOSPITAL_COMMUNITY): Payer: Self-pay

## 2022-10-13 LAB — URINALYSIS, COMPLETE W/RFL CULTURE
Bacteria, UA: NONE SEEN /HPF
Glucose, UA: NEGATIVE
Hyaline Cast: NONE SEEN /LPF
Leukocyte Esterase: NEGATIVE
Nitrites, Initial: NEGATIVE
Specific Gravity, Urine: 1.025 (ref 1.001–1.035)
WBC, UA: NONE SEEN /HPF (ref 0–5)
pH: 6 (ref 5.0–8.0)

## 2022-10-13 LAB — CULTURE INDICATED

## 2022-10-13 LAB — URINE CULTURE
MICRO NUMBER:: 15335810
Result:: NO GROWTH
SPECIMEN QUALITY:: ADEQUATE

## 2022-10-14 ENCOUNTER — Ambulatory Visit (HOSPITAL_BASED_OUTPATIENT_CLINIC_OR_DEPARTMENT_OTHER)
Admission: RE | Admit: 2022-10-14 | Discharge: 2022-10-14 | Disposition: A | Discharge status: Home / Self Care | Payer: 59 | Source: Ambulatory Visit | Attending: Gastroenterology | Admitting: Gastroenterology

## 2022-10-14 ENCOUNTER — Encounter: Payer: Self-pay | Admitting: Nurse Practitioner

## 2022-10-14 DIAGNOSIS — F411 Generalized anxiety disorder: Secondary | ICD-10-CM | POA: Insufficient documentation

## 2022-10-14 DIAGNOSIS — N824 Other female intestinal-genital tract fistulae: Secondary | ICD-10-CM | POA: Insufficient documentation

## 2022-10-14 DIAGNOSIS — Z90710 Acquired absence of both cervix and uterus: Secondary | ICD-10-CM | POA: Diagnosis not present

## 2022-10-14 DIAGNOSIS — N3289 Other specified disorders of bladder: Secondary | ICD-10-CM | POA: Diagnosis not present

## 2022-10-14 MED ORDER — GADOBUTROL 1 MMOL/ML IV SOLN
10.0000 mL | Freq: Once | INTRAVENOUS | Status: AC | PRN
  Administered 2022-10-14: 10 mL via INTRAVENOUS

## 2022-10-14 NOTE — Assessment & Plan Note (Signed)
Likely multifactorial. Improvement with temazepam. No aberrant behavior. Recommend she optimize management of her anxiety with reinitiating lexapro. She was amenable to this plan and will discuss with her PCP. Sleep hygiene reviewed. Discussed how untreated OSA plays a role in night awakenings. Encouraged her to increase use of CPAP. Verbalized understanding.   Patient Instructions  Increase use of CPAP every night, minimum of 4-6 hours a night.  Change equipment every 30 days or as directed by DME. Wash your tubing with warm soap and water daily, hang to dry. Wash humidifier portion weekly. Use bottled, distilled water and change daily Be aware of reduced alertness and do not drive or operate heavy machinery if experiencing this or drowsiness.  Notify if persistent daytime sleepiness occurs even with consistent use of CPAP.  Continue temazepam 30 mg At bedtime as needed for sleep. Do not drive after taking  Discuss restarting your lexapro with your primary care doctor as this seemed to better manage your anxiety and sleep before.   Follow up in 6-8 weeks with Dr. Wynona Neat (1st) or Katie Raidon Swanner,NP. If symptoms do not improve or worsen, please contact office for sooner follow up or seek emergency care.

## 2022-10-14 NOTE — Assessment & Plan Note (Addendum)
Suboptimal compliance with CPAP. See above. Understands proper use/care of device. She does receive benefit from use, when she uses it routinely. Aware of safe driving practices. Healthy weight loss encouraged.

## 2022-10-14 NOTE — Assessment & Plan Note (Signed)
See above

## 2022-10-14 NOTE — Telephone Encounter (Signed)
FMLA will be continuous at this time.  She is not able to work at all.  I told her I can to this FMLA until she sees general surgeon or urogynecology.  These specialists will be directing her care once they provide consultation. They will then take over her FMLA.

## 2022-10-14 NOTE — Telephone Encounter (Signed)
Please monitor the referral to to urogynecology.  This was requested as an urgent referral due to the diagnosis of vaginal fistula.  Patient has large fecal staining from the vagina.

## 2022-10-14 NOTE — Patient Instructions (Addendum)
Increase use of CPAP every night, minimum of 4-6 hours a night.  Change equipment every 30 days or as directed by DME. Wash your tubing with warm soap and water daily, hang to dry. Wash humidifier portion weekly. Use bottled, distilled water and change daily Be aware of reduced alertness and do not drive or operate heavy machinery if experiencing this or drowsiness.  Notify if persistent daytime sleepiness occurs even with consistent use of CPAP.  Continue temazepam 30 mg At bedtime as needed for sleep. Do not drive after taking  Discuss restarting your lexapro with your primary care doctor as this seemed to better manage your anxiety and sleep before.   Follow up in 6-8 weeks with Dr. Wynona [name] (1st) or Victoria Latandra Loureiro,NP. If symptoms do not improve or worsen, please contact office for sooner follow up or seek emergency care.

## 2022-10-15 ENCOUNTER — Other Ambulatory Visit (HOSPITAL_COMMUNITY): Payer: Self-pay

## 2022-10-15 NOTE — Telephone Encounter (Signed)
Please note that the patient had a pelvic MRI done on 10/14/22.  Results are pending.  Based on the result, she may or may not need the pelvic ultrasound this week.

## 2022-10-15 NOTE — Telephone Encounter (Signed)
Routing to provider for final review.

## 2022-10-15 NOTE — Telephone Encounter (Signed)
Spoke with patient. Advised of appt with Urogynecology. Patient states she is also scheduled for consult with general surgeon, Dr. Michaell Cowing, on 10/31/22. Advised FMLA to Dr. Edward Jolly to be signed and faxed. Advised to return call to office if any additional assistance needed in the meantime.   FMLA Forms completed and to Dr. Edward Jolly to review and sign.

## 2022-10-15 NOTE — Telephone Encounter (Signed)
Left message to call Noreene Larsson, RN at [facility], [phone], option 5.  Spoke with Amy at Urogynecology, scheduled for OV with Dr. Florian Buff on 10/26/22 at 1020. Will send new patient forms through MyChart, will place on waitlist for earlier appt.

## 2022-10-15 NOTE — Telephone Encounter (Signed)
Message to Urogynecology to f/u on scheduling.

## 2022-10-16 ENCOUNTER — Other Ambulatory Visit (HOSPITAL_COMMUNITY): Payer: Self-pay

## 2022-10-16 NOTE — Telephone Encounter (Signed)
Fax receipt confirmed for both.

## 2022-10-16 NOTE — Telephone Encounter (Signed)
Disability and FMLA forms signed and updated to reflect the appointment with urogynecology on 10/17/22.

## 2022-10-16 NOTE — Telephone Encounter (Signed)
FMLA and Disability forms completed and faxed.

## 2022-10-17 ENCOUNTER — Other Ambulatory Visit: Payer: Self-pay | Admitting: Internal Medicine

## 2022-10-17 ENCOUNTER — Ambulatory Visit
Admission: RE | Admit: 2022-10-17 | Discharge: 2022-10-17 | Disposition: A | Discharge status: Home / Self Care | Payer: 59 | Source: Ambulatory Visit | Attending: Obstetrics and Gynecology | Admitting: Obstetrics and Gynecology

## 2022-10-17 ENCOUNTER — Ambulatory Visit (INDEPENDENT_AMBULATORY_CARE_PROVIDER_SITE_OTHER): Payer: 59 | Admitting: Obstetrics and Gynecology

## 2022-10-17 ENCOUNTER — Other Ambulatory Visit: Payer: Self-pay

## 2022-10-17 ENCOUNTER — Encounter: Payer: Self-pay | Admitting: Obstetrics and Gynecology

## 2022-10-17 ENCOUNTER — Other Ambulatory Visit (HOSPITAL_COMMUNITY): Payer: Self-pay

## 2022-10-17 VITALS — BP 134/95 | HR 66 | Ht 65.5 in | Wt 273.0 lb

## 2022-10-17 DIAGNOSIS — Z90710 Acquired absence of both cervix and uterus: Secondary | ICD-10-CM | POA: Diagnosis not present

## 2022-10-17 DIAGNOSIS — N828 Other female genital tract fistulae: Secondary | ICD-10-CM | POA: Diagnosis not present

## 2022-10-17 DIAGNOSIS — R350 Frequency of micturition: Secondary | ICD-10-CM

## 2022-10-17 DIAGNOSIS — L988 Other specified disorders of the skin and subcutaneous tissue: Secondary | ICD-10-CM

## 2022-10-17 DIAGNOSIS — G4733 Obstructive sleep apnea (adult) (pediatric): Secondary | ICD-10-CM | POA: Diagnosis not present

## 2022-10-17 LAB — POCT URINALYSIS DIPSTICK
Bilirubin, UA: NEGATIVE
Blood, UA: NEGATIVE
Glucose, UA: NEGATIVE
Ketones, UA: NEGATIVE
Leukocytes, UA: NEGATIVE
Nitrite, UA: NEGATIVE
Protein, UA: NEGATIVE
Spec Grav, UA: 1.01 (ref 1.010–1.025)
Urobilinogen, UA: 0.2 E.U./dL
pH, UA: 6 (ref 5.0–8.0)

## 2022-10-17 MED ORDER — ALBUTEROL SULFATE HFA 108 (90 BASE) MCG/ACT IN AERS
2.0000 | INHALATION_SPRAY | RESPIRATORY_TRACT | 3 refills | Status: AC | PRN
  Filled 2022-10-17: qty 18, 16d supply, fill #0
  Filled 2022-12-24: qty 6.7, 25d supply, fill #1

## 2022-10-17 MED ORDER — ESCITALOPRAM OXALATE 10 MG PO TABS
10.0000 mg | ORAL_TABLET | Freq: Every day | ORAL | 1 refills | Status: DC
  Filled 2022-10-17 (×2): qty 90, 90d supply, fill #0

## 2022-10-17 NOTE — Progress Notes (Signed)
Urogynecology New Patient Evaluation and Consultation  Referring Provider: Janean Sark* PCP: Margaree Mackintosh, MD Date of Service: 10/17/2022  SUBJECTIVE Chief Complaint: New Patient (Initial Visit) [facility] Victoria [name] is a 62 y.o. female here for a consult for a possible fistula.)  History of Present Illness: Victoria [name] is a 62 y.o. Black or African-American female seen in consultation at the request of Dr. Ardell Isaacs for evaluation of fistula.    She is an Charity fundraiser with [employer]  Review of records significant for: Presented to ER on 8/13 after noting feces in vagina. Had also noted vaginal bleeding. Seen by Dr Edward Jolly- stool noted in vagina. Unclear location of fistula.  MRI performed 8/18  Had colonoscopy 04/2022 with Dr Loreta Ave- inadequate prep noted, although . One polyp removed which was benign. Has dx of lymphocytic colitis. Has been taking budesonide  Pt reports that she woke up last Monday morning and had some vaginal discomfort. Noticed Tuesday morning that she was full of stool. Noticed it was coming from the vagina. She then went to the emergency room. Since that time, has seen less feces but still present. Does notice a foul odor and discharge.   Has not had intercourse in years. Denies putting anything in the vagina or rectum.   Bowel Symptom: Bowel movements: 1 time(s) every few days then will go multiple times Stool consistency: solid, more recently in the last week has been loose  Straining: no.  Splinting: no.  Incomplete evacuation: no.   Denies accidental bowel leakage / fecal incontinence prior to this episode Bowel regimen: miralax, esp after knee surgery  Sexual Function Sexually active: no.     Past Medical History:  Past Medical History:  Diagnosis Date   Anxiety    Back pain    Bursitis    right shoulder   Chronic kidney disease    hematuria, has been worked up, her normal   Chronic leukopenia     intermittant since 2010, followed by Dr. Lenord Fellers now   Chronic neck pain    Constipation    Degenerative joint disease of low back 09/02/2015   Dr. Despina Hick   Displacement of lumbar intervertebral disc    Dysrhythmia    Tachycardia, PVCs, PACs   Edema of both lower legs    Fibrocystic breast    Gallbladder problem    GERD (gastroesophageal reflux disease)    Headache    per pt more stress/tension   Heart palpitations    SEE EPIC ENCOUTNER , CARDIOLOGY DR. Gloris Manchester TURNER 2018; reports on 05-16-17 " i haven't had any bouts of those lately"     Hemorrhoids    Hidradenitis suppurativa    History of Clostridium difficile infection 12/2010   History of exercise intolerance    ETT on 04-27-2016-- negative ([facility] treadmill score 7)   History of Helicobacter pylori infection 2001 and 09/ 2012   HSV-2 infection    genital   Hx of adenomatous colonic polyps 10/17/2007   Hydradenitis    per pt currently treated with Humira injection   Hypertension    Hyperthyroidism    Hypocupremia    Hypothyroidism    Hypothyroidism, postsurgical 1980   IBS (irritable bowel syndrome)    Insomnia    Iron deficiency    Joint pain    Leukopenia    Lumbosacral radiculopathy    Lymphocytic colitis 05/14/2022   dx from colonoscopy   Medial meniscus tear  right knee   Mild obstructive sleep apnea    Mitral regurgitation    Mild to moderate by echo 09/2021   Numbness and tingling of right arm    Obesity    Osteoarthritis    Palpitations    Pernicious anemia    b12 def   Peroneal neuropathy    PONV (postoperative nausea and vomiting)    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   Prediabetes    Reactive hypoglycemia followed by pcp   post gastrectomy dumping syndrome   SOB (shortness of breath)    Spinal stenosis    Tendonitis    right shoulder   Varicose veins    Vertigo    Vitamin B 12 deficiency    Vitamin D deficiency      Past Surgical History:   Past Surgical History:  Procedure  Laterality Date   BREATH TEK H PYLORI N/A 05/03/2014   Procedure: BREATH TEK H PYLORI;  Surgeon: Wenda Low, MD;  Location: [facility];  Service: General;  Laterality: N/A;   CARDIOVASCULAR STRESS TEST  03/11/2013   dr Gloris Manchester turner   Low risk nuclear study w/ a very small anterior perfusion defect that most likely represents breast attenuation artifact, less likely this could represent a true perfusion abnormality in the distribution of diagonal branch of the LAD/  normal LV function and wall motion , ef 66%   CATARACT EXTRACTION W/ INTRAOCULAR LENS  IMPLANT, BILATERAL  10/2017   CESAREAN SECTION  1985   CHOLECYSTECTOMY OPEN  1987   COLONOSCOPY  02/2017   Dr Loreta Ave ; per patient they found 7 polyps with polypectomy; on 5 year track    ESOPHAGOGASTRODUODENOSCOPY  02/01/2021   FINE NEEDLE ASPIRATION Left 05/22/2017   Procedure: LEFT KNEE ASPIRATION;  Surgeon: Ollen Gross, MD;  Location: [facility];  Service: Orthopedics;  Laterality: Left;   GASTRIC ROUX-EN-Y N/A 07/06/2014   Procedure: LAPAROSCOPIC ROUX-EN-Y GASTRIC BYPASS, ENTEROLYSIS OF ADHESIONS WITH UPPER ENDOSCOPY;  Surgeon: Luretha Murphy, MD;  Location: [facility];  Service: General;  Laterality: N/A;   HAMMER TOE SURGERY Bilateral 02/17/2008   bilateral foot digit's 2 and 5   HIATAL HERNIA REPAIR N/A 07/06/2014   Procedure: LAPAROSCOPIC REPAIR OF HIATAL HERNIA;  Surgeon: Luretha Murphy, MD;  Location: [facility];  Service: General;  Laterality: N/A;   I & D KNEE WITH POLY EXCHANGE Left 12/11/2019   Procedure: LEFT KNEE POLY-LINER EXCHANGE;  Surgeon: Kathryne Hitch, MD;  Location: [facility];  Service: Orthopedics;  Laterality: Left;   KNEE ARTHROSCOPY Right 12/11/2019   Procedure: RIGHT KNEE ARTHROSCOPY WITH PARTIAL MEDIAL MENISCECTOMY;  Surgeon: Kathryne Hitch, MD;  Location: [facility];  Service: Orthopedics;  Laterality: Right;   KNEE ARTHROSCOPY WITH MEDIAL MENISECTOMY Left 07/17/2017   Procedure: LEFT KNEE ARTHROSCOPY WITH MEDIAL  LATERAL MENISECTOMY;  Surgeon: Ollen Gross, MD;  Location: [facility];  Service: Orthopedics;  Laterality: Left;   MASS EXCISION N/A 10/11/2016   Procedure: EXCISION OF PERIANAL MASS;  Surgeon: Romie Levee, MD;  Location: [facility];  Service: General;  Laterality: N/A;   PARTIAL HYSTERECTOMY  2006   has ovaries   REFRACTIVE SURGERY     THYROIDECTOMY  1980   TOTAL ABDOMINAL HYSTERECTOMY  08-25-2002   dr Myrlene Broker   ovaries retained   TOTAL HIP ARTHROPLASTY Right 05/21/2012   Procedure: TOTAL HIP ARTHROPLASTY;  Surgeon: Loanne Drilling, MD;  Location: [facility];  Service: Orthopedics;  Laterality: Right;   TOTAL HIP ARTHROPLASTY  Left 01-31-2009  dr Lequita Halt   TOTAL HIP REVISION Left 05/22/2017   Procedure: Left hip bearing surface and head revision;  Surgeon: Ollen Gross, MD;  Location: [facility];  Service: Orthopedics;  Laterality: Left;   TOTAL KNEE ARTHROPLASTY Left 01/20/2018   Procedure: LEFT TOTAL KNEE ARTHROPLASTY;  Surgeon: Ollen Gross, MD;  Location: [facility];  Service: Orthopedics;  Laterality: Left;    TOTAL KNEE ARTHROPLASTY Right 07/13/2022   Procedure: RIGHTTOTAL KNEE ARTHROPLASTY;  Surgeon: Kathryne Hitch, MD;  Location: [facility];  Service: Orthopedics;  Laterality: Right;   TRANSTHORACIC ECHOCARDIOGRAM  05/03/2016   ef 55-60%/  trivial MR/  mild TR   TUBAL LIGATION       Past OB/GYN History: OB History  Gravida Para Term Preterm AB Living  2 1   1 1 2   SAB IAB Ectopic Multiple Live Births    1   1 2     # Outcome Date GA Lbr Len/2nd Weight Sex Type Anes PTL Lv  2A Preterm 07/15/83    F CS-LTranv   LIV  2B Preterm 07/15/83    F    LIV  1 IAB             S/p hysterectomy   Medications: She has a current medication list which includes the following prescription(s): humira (2 pen), albuterol, budesonide, carbinoxamine maleate, ergocalciferol, escitalopram, estradiol, flecainide, gabapentin, emgality, hydrocodone-acetaminophen, ipratropium,  levocetirizine, levothyroxine, metoprolol succinate, olopatadine hcl, ondansetron, promethazine, sulfamethoxazole-trimethoprim, temazepam, terconazole, tizanidine, triamcinolone cream, ubrelvy, and amoxicillin, and the following Facility-Administered Medications: gadobenate dimeglumine.   Allergies: Patient is allergic to other and estradiol.   Social History:  Social History   Tobacco Use   Smoking status: Never   Smokeless tobacco: Never  Vaping Use   Vaping status: Never Used  Substance Use Topics   Alcohol use: No   Drug use: No    Family History:   Family History  Problem Relation Age of Onset   High blood pressure Mother    Hypertension Mother    Headache Mother    High Cholesterol Mother    Thyroid disease Mother    Obesity Mother    Kidney disease Mother    Hypertension Father    Diabetes Father    High blood pressure Father    High Cholesterol Father    Heart disease Father    Obesity Father    Diabetes Sister    Hypertension Sister    Thyroid disease Brother    Thyroid disease Maternal Aunt    Migraines Neg Hx      Review of Systems: Review of Systems  Constitutional:  Negative for fever, malaise/fatigue and weight loss.  Respiratory:  Negative for cough, shortness of breath and wheezing.   Cardiovascular:  Positive for palpitations. Negative for chest pain and leg swelling.  Gastrointestinal:  Positive for abdominal pain. Negative for blood in stool.  Genitourinary:  Negative for dysuria.  Musculoskeletal:  Negative for myalgias.  Skin:  Negative for rash.  Neurological:  Negative for dizziness and headaches.  Endo/Heme/Allergies:  Does not bruise/bleed easily.  Psychiatric/Behavioral:  Negative for depression. The patient is not nervous/anxious.      OBJECTIVE Physical Exam: Vitals:   10/17/22 1523  BP: (!) 134/95  Pulse: 66  Weight: 273 lb (123.8 kg)  Height: 5' 5.5" (1.664 m)    Physical Exam Constitutional:      General: She is not in  acute distress. Pulmonary:     Effort: Pulmonary effort  is normal.  Abdominal:     General: There is no distension.     Palpations: Abdomen is soft.     Tenderness: There is no abdominal tenderness. There is no rebound.  Musculoskeletal:        General: No swelling. Normal range of motion.  Skin:    General: Skin is warm and dry.     Findings: No rash.  Neurological:     Mental Status: She is alert and oriented to person, place, and time.  Psychiatric:        Mood and Affect: Mood normal.        Behavior: Behavior normal.      GU / Detailed Urogynecologic Evaluation:  Pelvic Exam: Normal external female genitalia; Bartholin's and Skene's glands normal in appearance; urethral meatus normal in appearance, no urethral masses or discharge.   CST: negative   Small abrasion at the introitus, no bleeding present. s/p hysterectomy: Speculum exam reveals normal vaginal mucosa with  atrophy and normal vaginal cuff. No fistula or stool visualized.  Adnexa no mass, fullness, tenderness.    POP-Q:  Deferred, no prolapse   Rectal Exam:  Normal sphincter tone- sphincter intact, No rectal fistula noted. No stool noted from vagina on DRE.    Laboratory Results: MRI results from 8/18 pending. Reviewed MRI images and there appears to be bowel adjacent to the vaginal apex. In some frames, there is clear delineation and in other frames, there may  be evidence of fistula.   ASSESSMENT AND PLAN Victoria [name] is a 62 y.o. with:  1. Vaginal fistula   2. Urinary frequency    - No fistula noted on exam today. Did not see stool in the vagina but this was present on prior exams.  - Suspect if there is a fistula that it may be colovaginal, will need to wait for final MRI read. Contact [facility] and they state they will have a read by tomorrow.  - We discussed that if colovaginal fistula is present, then she should see colorectal surgery for further treatment.    Marguerita Beards,  MD

## 2022-10-17 NOTE — Telephone Encounter (Signed)
Disability Forms and FMLA forms scanned into [employer].   Routing to provider FYI.   Encounter closed.

## 2022-10-18 ENCOUNTER — Other Ambulatory Visit (HOSPITAL_COMMUNITY): Payer: Self-pay

## 2022-10-19 ENCOUNTER — Other Ambulatory Visit (HOSPITAL_COMMUNITY): Payer: Self-pay

## 2022-10-19 ENCOUNTER — Other Ambulatory Visit: Payer: Self-pay | Admitting: Internal Medicine

## 2022-10-22 ENCOUNTER — Other Ambulatory Visit: Payer: Self-pay | Admitting: Internal Medicine

## 2022-10-22 ENCOUNTER — Other Ambulatory Visit (HOSPITAL_COMMUNITY): Payer: Self-pay

## 2022-10-22 ENCOUNTER — Other Ambulatory Visit: Payer: Self-pay | Admitting: Gastroenterology

## 2022-10-22 DIAGNOSIS — N824 Other female intestinal-genital tract fistulae: Secondary | ICD-10-CM

## 2022-10-23 ENCOUNTER — Telehealth: Payer: Self-pay

## 2022-10-23 ENCOUNTER — Other Ambulatory Visit (HOSPITAL_COMMUNITY): Payer: Self-pay

## 2022-10-23 ENCOUNTER — Encounter: Payer: Self-pay | Admitting: Family

## 2022-10-23 MED ORDER — PROMETHAZINE HCL 25 MG PO TABS
25.0000 mg | ORAL_TABLET | Freq: Three times a day (TID) | ORAL | 5 refills | Status: DC | PRN
  Filled 2022-10-23: qty 30, 10d supply, fill #0
  Filled 2022-11-18: qty 30, 10d supply, fill #1
  Filled 2022-12-19: qty 30, 10d supply, fill #2

## 2022-10-23 NOTE — Telephone Encounter (Signed)
Pharmacy Patient Advocate Encounter   Received notification from CoverMyMeds that prior authorization for Emgality 120MG /ML auto-injectors (migraine) is required/requested.   Insurance verification completed.   The patient is insured through [facility] .   Per test claim: PA required; PA submitted to [facility] via CoverMyMeds Key/confirmation #/EOC [facility] Status is pending

## 2022-10-26 ENCOUNTER — Ambulatory Visit: Payer: 59 | Admitting: Obstetrics and Gynecology

## 2022-10-26 ENCOUNTER — Other Ambulatory Visit (HOSPITAL_COMMUNITY): Payer: Self-pay

## 2022-10-26 ENCOUNTER — Ambulatory Visit
Admission: RE | Admit: 2022-10-26 | Discharge: 2022-10-26 | Disposition: A | Discharge status: Home / Self Care | Payer: 59 | Source: Ambulatory Visit | Attending: Gastroenterology | Admitting: Gastroenterology

## 2022-10-26 DIAGNOSIS — N824 Other female intestinal-genital tract fistulae: Secondary | ICD-10-CM

## 2022-10-26 DIAGNOSIS — K6289 Other specified diseases of anus and rectum: Secondary | ICD-10-CM | POA: Diagnosis not present

## 2022-10-26 NOTE — Telephone Encounter (Signed)
Pharmacy Patient Advocate Encounter  Received notification from [employer] that Prior Authorization for Emgality 120MG /ML auto-injectors (migraine) has been APPROVED from 10-24-2022 to 10-23-2023   PA #/Case ID/Reference #: Monica Martinez

## 2022-10-28 DIAGNOSIS — G4733 Obstructive sleep apnea (adult) (pediatric): Secondary | ICD-10-CM | POA: Diagnosis not present

## 2022-10-31 ENCOUNTER — Other Ambulatory Visit (HOSPITAL_COMMUNITY): Payer: Self-pay

## 2022-10-31 ENCOUNTER — Other Ambulatory Visit: Payer: Self-pay | Admitting: Internal Medicine

## 2022-10-31 DIAGNOSIS — R194 Change in bowel habit: Secondary | ICD-10-CM | POA: Diagnosis not present

## 2022-10-31 DIAGNOSIS — D84821 Immunodeficiency due to drugs: Secondary | ICD-10-CM | POA: Diagnosis not present

## 2022-10-31 DIAGNOSIS — Z6841 Body Mass Index (BMI) 40.0 and over, adult: Secondary | ICD-10-CM | POA: Diagnosis not present

## 2022-10-31 DIAGNOSIS — K52832 Lymphocytic colitis: Secondary | ICD-10-CM | POA: Diagnosis not present

## 2022-10-31 DIAGNOSIS — N898 Other specified noninflammatory disorders of vagina: Secondary | ICD-10-CM | POA: Diagnosis not present

## 2022-11-01 ENCOUNTER — Telehealth: Payer: Self-pay | Admitting: Internal Medicine

## 2022-11-01 ENCOUNTER — Encounter: Payer: Self-pay | Admitting: Internal Medicine

## 2022-11-01 ENCOUNTER — Other Ambulatory Visit (HOSPITAL_COMMUNITY): Payer: Self-pay

## 2022-11-01 MED ORDER — LORAZEPAM 2 MG PO TABS
2.0000 mg | ORAL_TABLET | Freq: Every day | ORAL | 2 refills | Status: DC
Start: 2022-11-01 — End: 2022-12-04
  Filled 2022-11-01: qty 30, 30d supply, fill #0
  Filled 2022-11-27: qty 30, 30d supply, fill #1

## 2022-11-01 NOTE — Telephone Encounter (Signed)
Sent in lorazepam 2 mg (#30) to take at bedtime as needed for insomnia. Please take sparingly. This refill was requested by pt through My Chart.

## 2022-11-02 ENCOUNTER — Other Ambulatory Visit: Payer: Self-pay

## 2022-11-05 ENCOUNTER — Other Ambulatory Visit: Payer: Self-pay

## 2022-11-05 ENCOUNTER — Other Ambulatory Visit (HOSPITAL_COMMUNITY): Payer: Self-pay

## 2022-11-05 ENCOUNTER — Ambulatory Visit (INDEPENDENT_AMBULATORY_CARE_PROVIDER_SITE_OTHER): Payer: 59 | Admitting: Family Medicine

## 2022-11-05 ENCOUNTER — Encounter: Payer: Self-pay | Admitting: Family Medicine

## 2022-11-05 VITALS — BP 130/70 | HR 60 | Temp 97.3°F | Resp 16 | Wt 285.9 lb

## 2022-11-05 DIAGNOSIS — T783XXD Angioneurotic edema, subsequent encounter: Secondary | ICD-10-CM | POA: Diagnosis not present

## 2022-11-05 DIAGNOSIS — H10403 Unspecified chronic conjunctivitis, bilateral: Secondary | ICD-10-CM

## 2022-11-05 DIAGNOSIS — J310 Chronic rhinitis: Secondary | ICD-10-CM

## 2022-11-05 NOTE — Progress Notes (Addendum)
[address] [place] [address] Dept: [phone]  FOLLOW UP NOTE  Patient ID: Victoria [name], female    DOB: February 25, 1961  Age: 62 y.o. MRN: [id number] Date of Office Visit: 11/05/2022  Assessment  Chief Complaint: Follow-up  HPI Victoria [name] is a 62 year old female who presents to the clinic for a visit.  She was last seen in this clinic on 09/03/2022 by Thermon Leyland, FNP, for evaluation of angioedema, chronic rhinitis, and conjunctivitis.    At today's visit, she reports her allergic rhinitis has been poorly controlled with symptoms beginning a few days ago including intermittent sore throat followed by voice hoarseness.  She also reports symptoms including clear rhinorrhea, occasional sneezing, and copious postnasal drainage.  She reports the symptoms are the worst during the fall and the spring.  She continues azelastine about 3 days a week and is not currently using Flonase or nasal saline rinses.  She continues Xyzal 5 mg once a day, however, reports this is relieving her symptoms.  She occasionally takes carbinoxamine 4 mg once at night.  Her last environmental allergy skin testing on 06/28/2022 was negative to the environmental panel.    Allergic conjunctivitis is reported as moderately well-controlled with only occasional red and itchy eyes for which she uses over-the-counter allergy eyedrops with relief of symptoms.  She denies any episodes of angioedema since her last visit to this clinic.  She had a comprehensive lab workup for angioedema which was negative on 09/03/2022.  Her current medications are listed in the chart.  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  Constitutional:      Appearance: Normal appearance.  HENT:     Head: Normocephalic and atraumatic.     Right Ear: Tympanic membrane normal.     Left Ear: Tympanic membrane normal.     Nose:     Comments: Bilateral nares edematous and pale with thin clear nasal drainage noted.  Pharynx normal.  Ears normal.  Eyes normal.    Mouth/Throat:     Pharynx: Oropharynx is clear.  Eyes:     Conjunctiva/sclera: Conjunctivae normal.  Cardiovascular:     Rate and Rhythm: Normal rate and regular rhythm.     Heart sounds: Normal heart sounds. No murmur heard. Pulmonary:     Effort: Pulmonary effort is normal.     Breath sounds: Normal breath sounds.     Comments: Lungs clear to auscultation Musculoskeletal:        General: Normal range of motion.     Cervical back: Normal range of motion and neck supple.  Skin:    General: Skin is warm and dry.  Neurological:     Mental Status: She is alert and oriented to person, place, and time.  Psychiatric:        Mood and Affect: Mood normal.        Behavior: Behavior normal.        Thought Content: Thought content normal.  Judgment: Judgment normal.     Assessment and Plan: 1. Angioedema, subsequent encounter   2. Chronic rhinitis   3. Chronic conjunctivitis of both eyes, unspecified chronic conjunctivitis type     Patient Instructions  Angioedema Stable Continue carbinoxamine once or twice a day if needed for swelling If your symptoms re-occur, begin a journal of events that occurred for up to 6 hours before your symptoms began including foods and beverages consumed, soaps or perfumes you had contact with, and medications.   Chronic rhinitis Poorly controlled Previous negative environmental allergy skin prick testing and negative environmental allergy lab testing Begin nasal saline rinses. Use this before Flonase for best result Continue Flonase 2 sprays  in each nostril once a day as needed for stuffy nose.  In the right nostril, point the applicator out toward the right ear. In the left nostril, point the applicator out toward the left ear If needed, use the following: Continue carbinoxamine 4 mg tablets.  Take 1 to 2 tablets at bedtime if needed for a runny nose Continue Atrovent 2 sprays in each nostril twice a day as needed for runny nose  Allergic conjunctivitis Moderately well-controlled Some over the counter eye drops include Pataday one drop in each eye once a day as needed for red, itchy eyes OR Zaditor one drop in each eye twice a day as needed for red itchy eyes. Avoid eye drops that say red eye relief as they may contain medications that dry out your eyes.   Call the clinic if this treatment plan is not working well for you.  Follow up in 3 months or sooner if needed.  Return in about 3 months (around 02/04/2023), or if symptoms worsen or fail to improve.    Thank you for the opportunity to care for this patient.  Please do not hesitate to contact me with questions.  Thermon Leyland, FNP [facility]

## 2022-11-05 NOTE — Patient Instructions (Addendum)
Angioedema Stable Continue carbinoxamine once or twice a day if needed for swelling If your symptoms re-occur, begin a journal of events that occurred for up to 6 hours before your symptoms began including foods and beverages consumed, soaps or perfumes you had contact with, and medications.   Chronic rhinitis Poorly controlled Previous negative environmental allergy skin prick testing and negative environmental allergy lab testing Begin nasal saline rinses. Use this before Flonase for best result Continue Flonase 2 sprays in each nostril once a day as needed for stuffy nose.  In the right nostril, point the applicator out toward the right ear. In the left nostril, point the applicator out toward the left ear If needed, use the following: Continue carbinoxamine 4 mg tablets.  Take 1 to 2 tablets at bedtime if needed for a runny nose Continue Atrovent 2 sprays in each nostril twice a day as needed for runny nose  Allergic conjunctivitis Moderately well-controlled Some over the counter eye drops include Pataday one drop in each eye once a day as needed for red, itchy eyes OR Zaditor one drop in each eye twice a day as needed for red itchy eyes. Avoid eye drops that say red eye relief as they may contain medications that dry out your eyes.   Call the clinic if this treatment plan is not working well for you.  Follow up in 3 months or sooner if needed.

## 2022-11-17 ENCOUNTER — Encounter (HOSPITAL_COMMUNITY): Payer: Self-pay

## 2022-11-19 ENCOUNTER — Other Ambulatory Visit (HOSPITAL_COMMUNITY): Payer: Self-pay

## 2022-11-27 DIAGNOSIS — G4733 Obstructive sleep apnea (adult) (pediatric): Secondary | ICD-10-CM | POA: Diagnosis not present

## 2022-11-28 ENCOUNTER — Other Ambulatory Visit: Payer: Self-pay

## 2022-11-28 ENCOUNTER — Other Ambulatory Visit (HOSPITAL_COMMUNITY): Payer: Self-pay

## 2022-11-30 ENCOUNTER — Other Ambulatory Visit: Payer: Self-pay

## 2022-12-04 ENCOUNTER — Telehealth: Payer: Self-pay | Admitting: Internal Medicine

## 2022-12-04 ENCOUNTER — Other Ambulatory Visit (HOSPITAL_COMMUNITY): Payer: Self-pay

## 2022-12-04 ENCOUNTER — Ambulatory Visit (INDEPENDENT_AMBULATORY_CARE_PROVIDER_SITE_OTHER): Payer: 59 | Admitting: Internal Medicine

## 2022-12-04 ENCOUNTER — Encounter: Payer: Self-pay | Admitting: Internal Medicine

## 2022-12-04 VITALS — BP 100/70 | HR 70 | Ht 65.5 in | Wt 280.0 lb

## 2022-12-04 DIAGNOSIS — Z8669 Personal history of other diseases of the nervous system and sense organs: Secondary | ICD-10-CM

## 2022-12-04 DIAGNOSIS — G4709 Other insomnia: Secondary | ICD-10-CM

## 2022-12-04 DIAGNOSIS — F439 Reaction to severe stress, unspecified: Secondary | ICD-10-CM | POA: Diagnosis not present

## 2022-12-04 MED ORDER — ERGOCALCIFEROL 1.25 MG (50000 UT) PO CAPS
50000.0000 [IU] | ORAL_CAPSULE | ORAL | 1 refills | Status: DC
  Filled 2022-12-04: qty 12, 84d supply, fill #0

## 2022-12-04 MED ORDER — HYDROCODONE-ACETAMINOPHEN 5-325 MG PO TABS
1.0000 | ORAL_TABLET | Freq: Four times a day (QID) | ORAL | 0 refills | Status: DC | PRN
  Filled 2022-12-04: qty 12, 3d supply, fill #0

## 2022-12-04 MED ORDER — DOXEPIN HCL 50 MG PO CAPS
50.0000 mg | ORAL_CAPSULE | Freq: Every day | ORAL | 0 refills | Status: DC
  Filled 2022-12-04: qty 90, 90d supply, fill #0

## 2022-12-04 MED ORDER — LORAZEPAM 2 MG PO TABS
2.0000 mg | ORAL_TABLET | Freq: Every day | ORAL | 1 refills | Status: DC
  Filled 2022-12-04: qty 90, 90d supply, fill #0

## 2022-12-04 NOTE — Telephone Encounter (Signed)
Victoria [name] [phone]  [name] called to say she is still having issues with anxiety and wanted to come in and talk to you about it. I went ahead and scheduled her for this afternoon.

## 2022-12-04 NOTE — Progress Notes (Addendum)
Patient Care Team: Margaree Mackintosh, MD as PCP - General Quintella Reichert, MD as PCP - Cardiology (Cardiology) Linna Darner, RD as Dietitian (Family Medicine)  Visit Date: 12/04/22  Subjective:    Patient ID: Victoria [name] , Female   DOB: 09-05-1960, 62 y.o.    MRN: [id number]   62 y.o. Female presents today for insomnia, anxiety and depression. History of anxiety and depression treated with escitalopram 10 mg daily. Has taken doxepin in the past. History of vaginal fistula summer 2024. She is worried about this as well as the possibility of retirement. She is feeling unmotivated at work. She has seen a Warehouse manager.   History of IBS. She has an upcoming appointment with her gastroenterologist, Dr. Loreta Ave.  She has been to [facility] in the past but recently has not been motivated to lose weight. She has lost 6 pounds since 07/09/22.  Past Medical History:  Diagnosis Date   Anxiety    Back pain    Bursitis    right shoulder   Chronic kidney disease    hematuria, has been worked up, her normal   Chronic leukopenia    intermittant since 2010, followed by Dr. Lenord Fellers now   Chronic neck pain    Constipation    Degenerative joint disease of low back 09/02/2015   Dr. Despina Hick   Displacement of lumbar intervertebral disc    Dysrhythmia    Tachycardia, PVCs, PACs   Edema of both lower legs    Fibrocystic breast    Gallbladder problem    GERD (gastroesophageal reflux disease)    Headache    per pt more stress/tension   Heart palpitations    SEE EPIC ENCOUTNER , CARDIOLOGY DR. Gloris Manchester TURNER 2018; reports on 05-16-17 " i haven't had any bouts of those lately"     Hemorrhoids    Hidradenitis suppurativa    History of Clostridium difficile infection 12/2010   History of exercise intolerance    ETT on 04-27-2016-- negative ([facility] treadmill score 7)   History of Helicobacter pylori infection 2001 and 09/ 2012   HSV-2 infection    genital   Hx of adenomatous  colonic polyps 10/17/2007   Hydradenitis    per pt currently treated with Humira injection   Hypertension    Hyperthyroidism    Hypocupremia    Hypothyroidism    Hypothyroidism, postsurgical 1980   IBS (irritable bowel syndrome)    Insomnia    Iron deficiency    Joint pain    Leukopenia    Lumbosacral radiculopathy    Lymphocytic colitis 05/14/2022   dx from colonoscopy   Medial meniscus tear    right knee   Mild obstructive sleep apnea    Mitral regurgitation    Mild to moderate by echo 09/2021   Numbness and tingling of right arm    Obesity    Osteoarthritis    Palpitations    Pernicious anemia    b12 def   Peroneal neuropathy    PONV (postoperative nausea and vomiting)    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   Prediabetes    Reactive hypoglycemia followed by pcp   post gastrectomy dumping syndrome   SOB (shortness of breath)    Spinal stenosis    Tendonitis    right shoulder   Varicose veins    Vertigo    Vitamin B 12 deficiency    Vitamin D deficiency      Family History  Problem Relation Age of Onset   High blood pressure Mother    Hypertension Mother    Headache Mother    High Cholesterol Mother    Thyroid disease Mother    Obesity Mother    Kidney disease Mother    Hypertension Father    Diabetes Father    High blood pressure Father    High Cholesterol Father    Heart disease Father    Obesity Father    Diabetes Sister    Hypertension Sister    Thyroid disease Brother    Thyroid disease Maternal Aunt    Migraines Neg Hx     Social History   Social History Narrative   Lives at home with spouse   Right handed   Caffeine: diet zero mtn dew, occasionally       Review of Systems  Constitutional:  Negative for fever and malaise/fatigue.  HENT:  Negative for congestion.   Eyes:  Negative for blurred vision.  Respiratory:  Negative for cough and shortness of breath.   Cardiovascular:  Negative for chest pain, palpitations and leg  swelling.  Gastrointestinal:  Negative for vomiting.  Musculoskeletal:  Negative for back pain.  Skin:  Negative for rash.  Neurological:  Negative for loss of consciousness and headaches.  Psychiatric/Behavioral:  Positive for depression. The patient is nervous/anxious and has insomnia.         Objective:   Vitals: BP 100/70   Pulse 70   Ht 5' 5.5" (1.664 m)   Wt 280 lb (127 kg)   SpO2 98%   BMI 45.89 kg/m    Physical Exam Vitals and nursing note reviewed.  Constitutional:      General: She is not in acute distress.    Appearance: Normal appearance. She is not toxic-appearing.  HENT:     Head: Normocephalic and atraumatic.  Pulmonary:     Effort: Pulmonary effort is normal.  Skin:    General: Skin is warm and dry.  Neurological:     Mental Status: She is alert and oriented to person, place, and time. Mental status is at baseline.  Psychiatric:        Mood and Affect: Mood normal.        Behavior: Behavior normal.        Thought Content: Thought content normal.        Judgment: Judgment normal.       Results:   Studies obtained and personally reviewed by me:   Labs:       Component Value Date/Time   NA 140 10/09/2022 0436   NA 144 09/03/2022 1425   K 4.1 10/09/2022 0436   CL 104 10/09/2022 0436   CO2 26 10/09/2022 0436   GLUCOSE 97 10/09/2022 0436   BUN 15 10/09/2022 0436   BUN 9 09/03/2022 1425   CREATININE 1.43 (H) 10/09/2022 0436   CREATININE 1.23 (H) 07/09/2022 1132   CALCIUM 9.0 10/09/2022 0436   PROT 7.4 10/09/2022 0436   PROT 7.8 09/03/2022 1425   ALBUMIN 3.7 10/09/2022 0436   ALBUMIN 4.2 09/03/2022 1425   AST 33 10/09/2022 0436   AST 26 10/16/2018 0903   ALT 29 10/09/2022 0436   ALT 26 10/16/2018 0903   ALKPHOS 61 10/09/2022 0436   BILITOT 0.6 10/09/2022 0436   BILITOT 0.2 09/03/2022 1425   BILITOT 0.5 10/16/2018 0903   GFRNONAA 42 (L) 10/09/2022 0436   GFRNONAA 93 06/30/2020 1105   GFRAA 108 06/30/2020 1105  Lab Results   Component Value Date   WBC 4.7 10/09/2022   HGB 12.0 10/09/2022   HCT 37.7 10/09/2022   MCV 97.9 10/09/2022   PLT 204 10/09/2022    Lab Results  Component Value Date   CHOL 197 07/05/2022   HDL 86 07/05/2022   LDLCALC 99 07/05/2022   TRIG 41 07/05/2022   CHOLHDL 2.3 07/05/2022    Lab Results  Component Value Date   HGBA1C 5.7 (H) 07/05/2022     Lab Results  Component Value Date   TSH 3.17 07/05/2022      Assessment & Plan:   Insomnia: prescribed doxepin 50 mg at bedtime, lorazepam 2 mg at bedtime. Situational stress discussed at length including work issues.  Musculoskeletal pain: refilled Norco 5/325  q 6 hours as needed #12 tabs dispensed for musculoskeletal pain  Vitamin D deficiency: refilled high-dose Vitamin D supplement to be taken weekly    I,Alexander Ruley,acting as a scribe for Margaree Mackintosh, MD.,have documented all relevant documentation on the behalf of Margaree Mackintosh, MD,as directed by  Margaree Mackintosh, MD while in the presence of Margaree Mackintosh, MD.   I, Margaree Mackintosh, MD, have reviewed all documentation for this visit. The documentation on 12/17/22 for the exam, diagnosis, procedures, and orders are all accurate and complete.

## 2022-12-05 ENCOUNTER — Ambulatory Visit: Payer: 59 | Admitting: Obstetrics and Gynecology

## 2022-12-05 ENCOUNTER — Other Ambulatory Visit (HOSPITAL_COMMUNITY): Payer: Self-pay

## 2022-12-06 ENCOUNTER — Other Ambulatory Visit: Payer: Self-pay

## 2022-12-06 ENCOUNTER — Telehealth: Payer: Self-pay | Admitting: Internal Medicine

## 2022-12-06 ENCOUNTER — Encounter (HOSPITAL_COMMUNITY): Payer: Self-pay

## 2022-12-06 ENCOUNTER — Other Ambulatory Visit (HOSPITAL_COMMUNITY): Payer: Self-pay

## 2022-12-06 DIAGNOSIS — K219 Gastro-esophageal reflux disease without esophagitis: Secondary | ICD-10-CM | POA: Diagnosis not present

## 2022-12-06 DIAGNOSIS — K9089 Other intestinal malabsorption: Secondary | ICD-10-CM | POA: Diagnosis not present

## 2022-12-06 DIAGNOSIS — K52832 Lymphocytic colitis: Secondary | ICD-10-CM | POA: Diagnosis not present

## 2022-12-06 MED ORDER — LORAZEPAM 1 MG PO TABS
1.0000 mg | ORAL_TABLET | Freq: Two times a day (BID) | ORAL | 1 refills | Status: DC | PRN
Start: 2022-12-06 — End: 2023-03-18
  Filled 2022-12-06: qty 60, 30d supply, fill #0

## 2022-12-06 NOTE — Progress Notes (Signed)
Specialty Pharmacy Refill Coordination Note  Victoria [name] is a 62 y.o. female contacted today regarding refills of specialty medication(s) Adalimumab   Patient requested Delivery   Delivery date: 12/12/22   Verified address: [address]   Medication will be filled on 12/11/22.

## 2022-12-06 NOTE — Progress Notes (Signed)
Specialty Pharmacy Ongoing Clinical Assessment Note  Victoria [name] is a 62 y.o. female who is being followed by the specialty pharmacy service for RxSp Hidradenitis Suppurativa   Patient's specialty medication(s) reviewed today: Adalimumab   Missed doses in the last 4 weeks: 0   Patient/Caregiver did not have any additional questions or concerns.   Therapeutic benefit summary: Patient is achieving benefit   Adverse events/side effects summary: No adverse events/side effects   Patient's therapy is appropriate to: Continue    Goals Addressed             This Visit's Progress    Minimize recurrence of flares       Patient is on track. Patient will maintain adherence         Follow up:  6 months  Bobette Mo Specialty Pharmacist

## 2022-12-06 NOTE — Telephone Encounter (Signed)
Victoria [name] [phone]  [name] called to say she has not got her below medication yet, because the prescription that went in was the old instructions and the pharmacy needs the news ones for the 90 days, since she is going to be taking it different for anxiety.   LORazepam (ATIVAN) 2 MG tablet

## 2022-12-07 ENCOUNTER — Other Ambulatory Visit (HOSPITAL_COMMUNITY): Payer: Self-pay

## 2022-12-10 NOTE — Progress Notes (Addendum)
Patient Care Team: Margaree Mackintosh, MD as PCP - General Quintella Reichert, MD as PCP - Cardiology (Cardiology) Linna Darner, RD as Dietitian (Family Medicine)  Visit Date: 12/24/22  Subjective:    Patient ID: Victoria [name] , Female   DOB: 1960/04/27, 62 y.o.    MRN: [id number]   63 y.o. Female presents today for a 4-month follow-up. Patient has a past medical history of back pain, bursitis right shoulder, chronic leukopenia, neck pain, constipation, degenerative joint disease lower back, displacement lumbar intervertebral disc, lower extremity edema, fibrocystic breast disease, GERD, headaches, heart palpitations, hemorrhoids, hidradenitis suppurativa, C-diff infection 2012, H-pylori infection, hypothyroidism, hypocupremia, insomnia, iron deficiency, leukopenia, lumbosacral radiculopathy, medial meniscus tear, mild obstructive sleep apnea, mitral regurgitation, obesity, osteoarthritis, prediabetes, spinal stenosis, varicose veins, vertigo, Vitamin B12 deficiency, Vitamin D deficiency.  She is working prn currently and considering full retirement. Stress has improved with this new schedule.  History of impaired glucose tolerance. HGBA1c at 6.1% on 12/20/22, up from 5.7% on 07/05/22. She has been more sedentary recently due to back pain. Family history of diabetes in father and sister.  History of mild elevated cholesterol that is currently diet and exercise controlled. CHOL elevated at 203, LDL elevated at 106 on 12/20/22, up from 197 and 99 on 07/05/22. Liver functions normal.   She was seen in ED on 10/09/22 for vaginal fistula. Seen by gynecologist, Dr. Edward Jolly, urogynecologist, Dr. Florian Buff, and general surgeon, Dr. Michaell Cowing for further workup. Fistula has not been located on exam. 10/14/22 MRI pelvis showed vaginal cuff closely abuts and is inseparable from the distal sigmoid colon superiorly, however unfortunately imaging in this vicinity is substantially limited by susceptibility  artifact. Within this limitation, there is no obvious fistula. Assessment for secondary evidence of colovaginal fistula such as air within the vaginal cuff very limited on images submitted for review. 10/17/22 US pelvis was unremarkable. 10/26/22 barium enema X-ray negative for rectovaginal fistula or colovaginal fistula. Reexamination of 10/14/22 MRI showed the possibility of a small 12 o'clock perianal fistula.   Status post right total knee arthroplasty with Dr. Magnus Ivan, 07/13/22. Had arthroscopy of right knee in October 2021 for history of acute medial meniscal tear. History of left knee arthroplasty.    History of musculoskeletal pain treated with gabapentin 600 mg three times daily, tizanidine 4 mg three times daily. Has chronic back pain and has seen Dr. Murray Hodgkins. She is having increased back pain recently. Gabapentin helps with pain at night but cannot take during the day due to drowsiness.   History of anxiety and depression treated with escitalopram 10 mg daily, lorazepam 1 mg twice daily as needed, doxepin 50 mg at bedtime. She is taking escitalopram as needed. She does not believe doxepin is helping symptoms.  History of Vitamin D deficiency treated with ergocalciferol 50,000 units weekly.   History of palpitations, nonsustained atrial tachycardia treated with flecainide 50 mg daily at 6am, metoprolol succinate 25 mg daily. Blood pressure normal today at 118/72. Followed by cardiologist, Dr. Armanda Magic. She saw Dr. Mayford Knife in 2015 for chest pain. 2D echocardiogram was performed along with myocardial perfusion imaging which was normal. In March 2018 she had exercise tolerance test which was normal.    History of migraine headaches. In March 2019 she had MRI of the brain for headaches. Headaches can last up to 3 days per episode and was associated with some vertigo. 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 Treated  with Ubrelvy 100 mg as needed, Emgality 120 mg  monthly. Followed by Dr. Lucia Gaskins.    History of hidradenitis suppurativa treated with Humira 40 mg weekly.    History of hypothyroidism treated with levothyroxine 112 mcg daily. TSH at 1.68.   History of asthma treated with montelukast 10 mg daily at bedtime.   History of lymphocytic colitis treated with budesonide 9 mg daily in the morning. She is followed by Dr. Loreta Ave, gastroenterologist.   History of Vitamin B12 deficiency.   History of iron deficiency. In March 2022, ferritin was normal at 42. In August 2020 her ferritin level was 5. Received iron infusions through oncology. Iron level in November 2021 was 88. Iron level was normal at 124 in March 2022. Iron level in August 2022 was normal at 105.   After gastric bypass surgery she had reactive hypoglycemia in 2017.  She saw Dr. Lucianne Muss, endocrinologist and improved.  He started her on acarbose before breakfast and lunch and she did fairly well.  It took a little bit of time for this to improve and this is no longer an issue.  History of gastric bypass Roux-en-Y laparoscopic surgery by Dr. Daphine Deutscher in 2016.  Weight before surgery was 296 pounds.    Had thyroidectomy in 1980 and is on thyroid replacement medication. Cholecystectomy 1987. Remote history of adenomatous colon polyp. C-section for twin daughters in 53. Bilateral tubal ligation in the 1980s. Total abdominal hysterectomy 2004. Had left hip arthroplasty December 2010. Right hip arthroplasty March 2014. Endoscopy for epigastric pain in 2012 by Dr. Juanda Chance. Upper endoscopy in 2012 showed chronic duodenitis which was treated with PPI. History of GE reflux.    Longstanding history of benign ethnic neutropenia.    Has had recurrent vaginitis issues seen by Dr. Leda Quail, GYN physician.    Pap smear last completed 08/14/18. Negative for intraepithelial lesions or malignancy. Dr. Annamaria Boots is GYN physician.   Mammogram last completed 05/29/22. No mammographic evidence of  malignancy. Recommended repeat in 2025.   Colonoscopy last completed 05/14/22. Results showed one small sessile polyp in mid-ascending colon. Examination otherwise normal. Pathology showed diminutive tubular adenoma. Recommended repeat in 2031.   Bone density ordered by GYN physician in 2021 showed normal scores.    Social history: First marriage ended in divorce. She has remarried. Husband is disabled. She does not smoke or consume alcohol. 2 adult twin daughters. Patient works full-time with [employer] as a Designer, jewellery. She is planning to retire soon.  Family history: Mother living with history of diabetes and hypertension. Father deceased with history of diabetes. 2 brothers and 1 sister. Sister has hypothyroidism.   Past Medical History:  Diagnosis Date   Anxiety    Back pain    Bursitis    right shoulder   Chronic kidney disease    hematuria, has been worked up, her normal   Chronic leukopenia    intermittant since 2010, followed by Dr. Lenord Fellers now   Chronic neck pain    Constipation    Degenerative joint disease of low back 09/02/2015   Dr. Despina Hick   Displacement of lumbar intervertebral disc    Dysrhythmia    Tachycardia, PVCs, PACs   Edema of both lower legs    Fibrocystic breast    Gallbladder problem    GERD (gastroesophageal reflux disease)    Headache    per pt more stress/tension   Heart palpitations    SEE EPIC ENCOUTNER , CARDIOLOGY DR. Gloris Manchester TURNER 2018; reports  on 05-16-17 " i haven't had any bouts of those lately"     Hemorrhoids    Hidradenitis suppurativa    History of Clostridium difficile infection 12/2010   History of exercise intolerance    ETT on 04-27-2016-- negative ([facility] treadmill score 7)   History of Helicobacter pylori infection 2001 and 09/ 2012   HSV-2 infection    genital   Hx of adenomatous colonic polyps 10/17/2007   Hydradenitis    per pt currently treated with Humira injection   Hypertension    Hyperthyroidism     Hypocupremia    Hypothyroidism    Hypothyroidism, postsurgical 1980   IBS (irritable bowel syndrome)    Insomnia    Iron deficiency    Joint pain    Leukopenia    Lumbosacral radiculopathy    Lymphocytic colitis 05/14/2022   dx from colonoscopy   Medial meniscus tear    right knee   Mild obstructive sleep apnea    Mitral regurgitation    Mild to moderate by echo 09/2021   Numbness and tingling of right arm    Obesity    Osteoarthritis    Palpitations    Pernicious anemia    b12 def   Peroneal neuropathy    PONV (postoperative nausea and vomiting)    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   Prediabetes    Reactive hypoglycemia followed by pcp   post gastrectomy dumping syndrome   SOB (shortness of breath)    Spinal stenosis    Tendonitis    right shoulder   Varicose veins    Vertigo    Vitamin B 12 deficiency    Vitamin D deficiency      Family History  Problem Relation Age of Onset   High blood pressure Mother    Hypertension Mother    Headache Mother    High Cholesterol Mother    Thyroid disease Mother    Obesity Mother    Kidney disease Mother    Hypertension Father    Diabetes Father    High blood pressure Father    High Cholesterol Father    Heart disease Father    Obesity Father    Diabetes Sister    Hypertension Sister    Thyroid disease Brother    Thyroid disease Maternal Aunt    Migraines Neg Hx     Social History   Social History Narrative   Lives at home with spouse   Right handed   Caffeine: diet zero mtn dew, occasionally       Review of Systems  Constitutional:  Negative for fever and malaise/fatigue.  HENT:  Negative for congestion.   Eyes:  Negative for blurred vision.  Respiratory:  Negative for cough and shortness of breath.   Cardiovascular:  Negative for chest pain, palpitations and leg swelling.  Gastrointestinal:  Negative for vomiting.  Musculoskeletal:  Positive for back pain.  Skin:  Negative for rash.   Neurological:  Negative for loss of consciousness and headaches.        Objective:   Vitals: BP 110/80   Pulse 61   Ht 5' 5.5" (1.664 m)   Wt 295 lb (133.8 kg)   SpO2 98%   BMI 48.34 kg/m    Physical Exam Vitals and nursing note reviewed.  Constitutional:      General: She is not in acute distress.    Appearance: Normal appearance. She is not toxic-appearing.  HENT:     Head: Normocephalic and atraumatic.  Cardiovascular:     Rate and Rhythm: Normal rate and regular rhythm. No extrasystoles are present.    Pulses: Normal pulses.     Heart sounds: Normal heart sounds. No murmur heard.    No friction rub. No gallop.  Pulmonary:     Effort: Pulmonary effort is normal. No respiratory distress.     Breath sounds: Normal breath sounds. No wheezing or rales.  Skin:    General: Skin is warm and dry.  Neurological:     Mental Status: She is alert and oriented to person, place, and time. Mental status is at baseline.  Psychiatric:        Mood and Affect: Mood normal.        Behavior: Behavior normal.        Thought Content: Thought content normal.        Judgment: Judgment normal.       Results:   Studies obtained and personally reviewed by me:  Pap smear last completed 08/14/18. Negative for intraepithelial lesions or malignancy. Dr. Annamaria Boots is GYN physician.   Mammogram last completed 05/29/22. No mammographic evidence of malignancy. Recommended repeat in 2025.   Colonoscopy last completed 05/14/22. Results showed one small sessile polyp in mid-ascending colon. Examination otherwise normal. Pathology showed diminutive tubular adenoma. Recommended repeat in 2031.   Bone density ordered by GYN physician in 2021 showed normal scores.   Labs:       Component Value Date/Time   NA 140 10/09/2022 0436   NA 144 09/03/2022 1425   K 4.1 10/09/2022 0436   CL 104 10/09/2022 0436   CO2 26 10/09/2022 0436   GLUCOSE 97 10/09/2022 0436   BUN 15 10/09/2022 0436    BUN 9 09/03/2022 1425   CREATININE 1.43 (H) 10/09/2022 0436   CREATININE 1.23 (H) 07/09/2022 1132   CALCIUM 9.0 10/09/2022 0436   PROT 7.0 12/20/2022 0925   PROT 7.8 09/03/2022 1425   ALBUMIN 3.7 10/09/2022 0436   ALBUMIN 4.2 09/03/2022 1425   AST 15 12/20/2022 0925   AST 26 10/16/2018 0903   ALT 16 12/20/2022 0925   ALT 26 10/16/2018 0903   ALKPHOS 61 10/09/2022 0436   BILITOT 0.4 12/20/2022 0925   BILITOT 0.2 09/03/2022 1425   BILITOT 0.5 10/16/2018 0903   GFRNONAA 42 (L) 10/09/2022 0436   GFRNONAA 93 06/30/2020 1105   GFRAA 108 06/30/2020 1105     Lab Results  Component Value Date   WBC 4.7 10/09/2022   HGB 12.0 10/09/2022   HCT 37.7 10/09/2022   MCV 97.9 10/09/2022   PLT 204 10/09/2022    Lab Results  Component Value Date   CHOL 203 (H) 12/20/2022   HDL 83 12/20/2022   LDLCALC 106 (H) 12/20/2022   TRIG 62 12/20/2022   CHOLHDL 2.4 12/20/2022    Lab Results  Component Value Date   HGBA1C 6.1 (H) 12/20/2022     Lab Results  Component Value Date   TSH 1.68 12/20/2022      Assessment & Plan:   Impaired glucose tolerance: she will work on controlling with diet and exercise. HGBA1c at 6.1% on 12/20/22, up from 5.7% on 07/05/22.   Fatigue: she has been experiencing lack of motivation recently. Prescribed Welbutrin XL 150 mg daily.  Mild elevated cholesterol: currently diet and exercise controlled. CHOL elevated at 203, LDL elevated at 106 on 12/20/22, up from 197 and 99 on 07/05/22. Liver functions normal.   Back pain: this has been bothering her  recently. She will follow-up with [facility]. Treated with gabapentin 600 mg three times daily, tizanidine 4 mg three times daily.   Anxiety and depression: stable with escitalopram 10 mg that she takes as needed, lorazepam 1 mg twice daily as needed. Stop doxepin. Start Welbutrin XL 150 mg daily. Try this for 4-6 weeks. Can be increased to 300 mg daily if energy level is improving.  Vitamin D deficiency:  treated with ergocalciferol 50,000 units twice weekly.   Palpitations, nonsustained atrial tachycardia: treated with flecainide 50 mg daily at 6am, metoprolol succinate 25 mg daily. Blood pressure normal today at 118/72. Followed by cardiologist, Dr. Armanda Magic.   Hidradenitis suppurativa: treated with Humira 40 mg weekly.    Hypothyroidism: treated with levothyroxine 112 mcg daily. TSH at 1.68.   Asthma: treated with montelukast 10 mg daily at bedtime.   Lymphocytic colitis: stable with budesonide 9 mg daily in the morning. She is followed by Dr. Loreta Ave, gastroenterologist.  Pap smear last completed 08/14/18. Negative for intraepithelial lesions or malignancy. Dr. Annamaria Boots is GYN physician.   Mammogram last completed 05/29/22. No mammographic evidence of malignancy. Recommended repeat in 2025.   Colonoscopy last completed 05/14/22. Results showed one small sessile polyp in mid-ascending colon. Examination otherwise normal. Pathology showed diminutive tubular adenoma. Recommended repeat in 2031.   Bone density ordered by GYN physician in 2021 showed normal scores.   Vaccine counseling: UTD on flu, tetanus, shingles vaccines. Administered flu vaccine.  Return in 3 months for follow-up on fatigue.    I,Alexander Ruley,acting as a Neurosurgeon for Margaree Mackintosh, MD.,have documented all relevant documentation on the behalf of Margaree Mackintosh, MD,as directed by  Margaree Mackintosh, MD while in the presence of Margaree Mackintosh, MD.   I, Margaree Mackintosh, MD, have reviewed all documentation for this visit. The documentation on 12/24/22 for the exam, diagnosis, procedures, and orders are all accurate and complete.

## 2022-12-11 ENCOUNTER — Other Ambulatory Visit: Payer: Self-pay

## 2022-12-12 ENCOUNTER — Other Ambulatory Visit: Payer: Self-pay

## 2022-12-13 ENCOUNTER — Ambulatory Visit: Payer: 59 | Admitting: Obstetrics and Gynecology

## 2022-12-14 ENCOUNTER — Telehealth: Payer: Self-pay | Admitting: Pharmacist

## 2022-12-14 NOTE — Telephone Encounter (Signed)
Called patient to schedule an appointment for the [employer] [facility]. I was unable to reach the patient so I left a HIPAA-compliant message requesting that the patient return my call.   Luke Van Ausdall, PharmD, BCACP, CPP Clinical Pharmacist [facility] [phone]  

## 2022-12-19 ENCOUNTER — Other Ambulatory Visit: Payer: Self-pay

## 2022-12-19 ENCOUNTER — Other Ambulatory Visit: Payer: Self-pay | Admitting: Internal Medicine

## 2022-12-19 ENCOUNTER — Other Ambulatory Visit (HOSPITAL_COMMUNITY): Payer: Self-pay

## 2022-12-19 DIAGNOSIS — L732 Hidradenitis suppurativa: Secondary | ICD-10-CM | POA: Diagnosis not present

## 2022-12-19 MED ORDER — LEVOTHYROXINE SODIUM 112 MCG PO TABS
112.0000 ug | ORAL_TABLET | Freq: Every day | ORAL | 1 refills | Status: DC
  Filled 2022-12-19: qty 90, 90d supply, fill #0

## 2022-12-19 MED ORDER — HUMIRA (2 PEN) 40 MG/0.8ML ~~LOC~~ AJKT
AUTO-INJECTOR | SUBCUTANEOUS | 5 refills | Status: DC
  Filled 2023-01-04: qty 4, 28d supply, fill #0

## 2022-12-20 ENCOUNTER — Other Ambulatory Visit (HOSPITAL_COMMUNITY): Payer: Self-pay

## 2022-12-20 ENCOUNTER — Other Ambulatory Visit: Payer: 59

## 2022-12-20 ENCOUNTER — Other Ambulatory Visit (INDEPENDENT_AMBULATORY_CARE_PROVIDER_SITE_OTHER): Payer: 59

## 2022-12-20 ENCOUNTER — Encounter: Payer: Self-pay | Admitting: Orthopaedic Surgery

## 2022-12-20 ENCOUNTER — Ambulatory Visit: Payer: 59 | Admitting: Orthopaedic Surgery

## 2022-12-20 DIAGNOSIS — R7303 Prediabetes: Secondary | ICD-10-CM | POA: Diagnosis not present

## 2022-12-20 DIAGNOSIS — Z96651 Presence of right artificial knee joint: Secondary | ICD-10-CM

## 2022-12-20 DIAGNOSIS — Z13220 Encounter for screening for lipoid disorders: Secondary | ICD-10-CM | POA: Diagnosis not present

## 2022-12-20 DIAGNOSIS — E211 Secondary hyperparathyroidism, not elsewhere classified: Secondary | ICD-10-CM | POA: Diagnosis not present

## 2022-12-20 NOTE — Progress Notes (Signed)
The patient is now just past 5 months status post a right total knee arthroplasty.  She reports that she is doing okay and making progress.  She is an active 62 year old female.  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  She reports that she is doing well and has good range of motion and strength.  She has a little bit of stiffness but overall she states she is happy.  Her more recent right operative knee has full flexion and extension.  It feels stable on my exam as well.  Her left knee also feels stable.  An AP standing that shows both knees as well as a lateral the right knee shows a well-seated right total knee arthroplasty with no complicating features.  The left knee AP view also looks good.  On my standpoint we will see her back at the 1 year visit with a final AP and lateral of her right knee.  If there is any issues before then she will let us know.  All questions and concerns were addressed and answered.

## 2022-12-21 LAB — HEPATIC FUNCTION PANEL
AG Ratio: 1.2 (calc) (ref 1.0–2.5)
ALT: 16 U/L (ref 6–29)
AST: 15 U/L (ref 10–35)
Albumin: 3.8 g/dL (ref 3.6–5.1)
Alkaline phosphatase (APISO): 72 U/L (ref 37–153)
Bilirubin, Direct: 0.1 mg/dL (ref 0.0–0.2)
Globulin: 3.2 g/dL (ref 1.9–3.7)
Indirect Bilirubin: 0.3 mg/dL (ref 0.2–1.2)
Total Bilirubin: 0.4 mg/dL (ref 0.2–1.2)
Total Protein: 7 g/dL (ref 6.1–8.1)

## 2022-12-21 LAB — LIPID PANEL
Cholesterol: 203 mg/dL — ABNORMAL HIGH (ref <200)
HDL: 83 mg/dL (ref >=50)
LDL Cholesterol (Calc): 106 mg/dL — ABNORMAL HIGH
Non-HDL Cholesterol (Calc): 120 mg/dL (ref <130)
Total CHOL/HDL Ratio: 2.4 (calc) (ref <5.0)
Triglycerides: 62 mg/dL (ref <150)

## 2022-12-21 LAB — HEMOGLOBIN A1C
Hgb A1c MFr Bld: 6.1 %{Hb} — ABNORMAL HIGH (ref <5.7)
Mean Plasma Glucose: 128 mg/dL
eAG (mmol/L): 7.1 mmol/L

## 2022-12-21 LAB — TSH: TSH: 1.68 m[IU]/L (ref 0.40–4.50)

## 2022-12-22 NOTE — Patient Instructions (Addendum)
Spent 25 minutes discussing situational stress, insomnia and chronic musculoskeletal pain issues.  Prescribed Norco 5/325 to take sparingly every 6 hours (number 12 tablets dispensed) take lorazepam 2 mg at bedtime along with doxepin 50 mg at bedtime.  Continue high-dose vitamin D 50,000 units weekly.

## 2022-12-24 ENCOUNTER — Encounter: Payer: Self-pay | Admitting: Obstetrics and Gynecology

## 2022-12-24 ENCOUNTER — Other Ambulatory Visit (HOSPITAL_COMMUNITY): Payer: Self-pay

## 2022-12-24 ENCOUNTER — Ambulatory Visit (INDEPENDENT_AMBULATORY_CARE_PROVIDER_SITE_OTHER): Payer: 59 | Admitting: Internal Medicine

## 2022-12-24 ENCOUNTER — Encounter (HOSPITAL_COMMUNITY): Payer: Self-pay | Admitting: Pharmacist

## 2022-12-24 ENCOUNTER — Ambulatory Visit (INDEPENDENT_AMBULATORY_CARE_PROVIDER_SITE_OTHER): Payer: 59 | Admitting: Obstetrics and Gynecology

## 2022-12-24 ENCOUNTER — Other Ambulatory Visit: Payer: Self-pay | Admitting: Internal Medicine

## 2022-12-24 ENCOUNTER — Encounter: Payer: Self-pay | Admitting: Internal Medicine

## 2022-12-24 VITALS — BP 128/86 | HR 68 | Ht 65.5 in | Wt 295.0 lb

## 2022-12-24 VITALS — BP 110/80 | HR 61 | Ht 65.5 in | Wt 295.0 lb

## 2022-12-24 DIAGNOSIS — E7439 Other disorders of intestinal carbohydrate absorption: Secondary | ICD-10-CM | POA: Diagnosis not present

## 2022-12-24 DIAGNOSIS — E538 Deficiency of other specified B group vitamins: Secondary | ICD-10-CM

## 2022-12-24 DIAGNOSIS — M545 Low back pain, unspecified: Secondary | ICD-10-CM

## 2022-12-24 DIAGNOSIS — G4709 Other insomnia: Secondary | ICD-10-CM

## 2022-12-24 DIAGNOSIS — N760 Acute vaginitis: Secondary | ICD-10-CM | POA: Diagnosis not present

## 2022-12-24 DIAGNOSIS — E559 Vitamin D deficiency, unspecified: Secondary | ICD-10-CM | POA: Diagnosis not present

## 2022-12-24 DIAGNOSIS — M1711 Unilateral primary osteoarthritis, right knee: Secondary | ICD-10-CM | POA: Diagnosis not present

## 2022-12-24 DIAGNOSIS — G8929 Other chronic pain: Secondary | ICD-10-CM

## 2022-12-24 DIAGNOSIS — N898 Other specified noninflammatory disorders of vagina: Secondary | ICD-10-CM | POA: Diagnosis not present

## 2022-12-24 DIAGNOSIS — R5383 Other fatigue: Secondary | ICD-10-CM

## 2022-12-24 DIAGNOSIS — F439 Reaction to severe stress, unspecified: Secondary | ICD-10-CM | POA: Diagnosis not present

## 2022-12-24 DIAGNOSIS — E890 Postprocedural hypothyroidism: Secondary | ICD-10-CM | POA: Diagnosis not present

## 2022-12-24 DIAGNOSIS — Z8639 Personal history of other endocrine, nutritional and metabolic disease: Secondary | ICD-10-CM

## 2022-12-24 DIAGNOSIS — N951 Menopausal and female climacteric states: Secondary | ICD-10-CM

## 2022-12-24 DIAGNOSIS — B9689 Other specified bacterial agents as the cause of diseases classified elsewhere: Secondary | ICD-10-CM

## 2022-12-24 DIAGNOSIS — Z23 Encounter for immunization: Secondary | ICD-10-CM

## 2022-12-24 DIAGNOSIS — Z96643 Presence of artificial hip joint, bilateral: Secondary | ICD-10-CM

## 2022-12-24 DIAGNOSIS — Z6841 Body Mass Index (BMI) 40.0 and over, adult: Secondary | ICD-10-CM

## 2022-12-24 LAB — WET PREP FOR TRICH, YEAST, CLUE

## 2022-12-24 MED ORDER — BUPROPION HCL ER (XL) 150 MG PO TB24
150.0000 mg | ORAL_TABLET | Freq: Every day | ORAL | 0 refills | Status: DC
  Filled 2022-12-24: qty 90, 90d supply, fill #0

## 2022-12-24 MED ORDER — METRONIDAZOLE 0.75 % VA GEL
1.0000 | Freq: Every day | VAGINAL | 0 refills | Status: DC
  Filled 2022-12-24: qty 70, 7d supply, fill #0

## 2022-12-24 MED ORDER — ESTRADIOL 0.75 MG/1.25 GM (0.06%) TD GEL
1.2500 g | Freq: Every day | TRANSDERMAL | 0 refills | Status: DC
  Filled 2022-12-24: qty 37.5, 60d supply, fill #0

## 2022-12-24 NOTE — Patient Instructions (Addendum)
Patient is having issues with maintaining energy level.  Hemoglobin A1c has increased a bit from 5.7% to 6.1%.  Unable to be as active due to back pain issues.  She plans to retire from [facility] [employer] in the near future.  Will try Wellbutrin to see if we can improve energy level.  Start with 150 mg of XL preparation daily.  She has plans to be seen at [facility] regarding her back pain.  She will stop doxepin.  Continue with vitamin D supplementation.  Continue thyroid replacement.  TSH is stable.  Flu vaccine given today.  Follow-up here in 3 months.

## 2022-12-24 NOTE — Patient Instructions (Signed)
Bacterial Vaginosis  Bacterial vaginosis is an infection that occurs when the normal balance of bacteria in the vagina changes. This change is caused by an overgrowth of certain bacteria in the vagina. Bacterial vaginosis is the most common vaginal infection among females aged 62 to 72 years. This condition increases the risk of sexually transmitted infections (STIs). Treatment can help reduce this risk. Treatment is very important for pregnant women because this condition can cause babies to be born early (prematurely) or at a low birth weight. What are the causes? This condition is caused by an increase in harmful bacteria that are normally present in small amounts in the vagina. However, the exact reason this condition develops is not known. You cannot get bacterial vaginosis from toilet seats, bedding, swimming pools, or contact with objects around you. What increases the risk? The following factors may make you more likely to develop this condition: Having a new sexual partner or multiple sexual partners, or having unprotected sex. Douching. Having an intrauterine device (IUD). Smoking. Abusing drugs and alcohol. This may lead to riskier sexual behavior. Taking certain antibiotic medicines. Being pregnant. What are the signs or symptoms? Some women with this condition have no symptoms. Symptoms may include: Wallace Cullens or white vaginal discharge. The discharge can be watery or foamy. A fish-like odor with discharge, especially after sex or during menstruation. Itching in and around the vagina. Burning or pain with urination. How is this diagnosed? This condition is diagnosed based on: Your medical history. A physical exam of the vagina. Checking a sample of vaginal fluid for harmful bacteria or abnormal cells. How is this treated? This condition is treated with antibiotic medicines. These may be given as a pill, a vaginal cream, or a medicine that is put into the vagina (suppository). If  the condition comes back after treatment, a second round of antibiotics may be needed. Follow these instructions at home: Medicines Take or apply over-the-counter and prescription medicines only as told by your health care provider. Take or apply your antibiotic medicine as told by your health care provider. Do not stop using the antibiotic even if you start to feel better. General instructions If you have a female sexual partner, tell her that you have a vaginal infection. She should follow up with her health care provider. If you have a female sexual partner, he does not need treatment. Avoid sexual activity until you finish treatment. Drink enough fluid to keep your urine pale yellow. Keep the area around your vagina and rectum clean. Wash the area daily with warm water. Wipe yourself from front to back after using the toilet. If you are breastfeeding, talk to your health care provider about continuing breastfeeding during treatment. Keep all follow-up visits. This is important. How is this prevented? Self-care Do not douche. Wash the outside of your vagina with warm water only. Wear cotton or cotton-lined underwear. Avoid wearing tight pants and pantyhose, especially during the summer. Safe sex Use protection when having sex. This includes: Using condoms. Using dental dams. This is a thin layer of a material made of latex or polyurethane that protects the mouth during oral sex. Limit the number of sexual partners. To help prevent bacterial vaginosis, it is best to have sex with just one partner (monogamous relationship). Make sure you and your sexual partner are tested for STIs. Drugs and alcohol Do not use any products that contain nicotine or tobacco. These products include cigarettes, chewing tobacco, and vaping devices, such as e-cigarettes. If you need help  quitting, ask your health care provider. Do not use drugs. Do not drink alcohol if: Your health care provider tells you not  to do this. You are pregnant, may be pregnant, or are planning to become pregnant. If you drink alcohol: Limit how much you have to 0-1 drink a day. Be aware of how much alcohol is in your drink. In the [place] one drink equals one 12 oz bottle of beer (355 mL), one 5 oz glass of wine (148 mL), or one 1 oz glass of hard liquor (44 mL). Where to find more information Centers for Disease Control and Prevention: FootballExhibition.com.br American Sexual Health Association (ASHA): www.ashastd.org [place] Department of Health and Health and safety inspector, Office on Women's Health: http://hoffman.com/ Contact a health care provider if: Your symptoms do not improve, even after treatment. You have more discharge or pain when urinating. You have a fever or chills. You have pain in your abdomen or pelvis. You have pain during sex. You have vaginal bleeding between menstrual periods. Summary Bacterial vaginosis is a vaginal infection that occurs when the normal balance of bacteria in the vagina changes. It results from an overgrowth of certain bacteria. This condition increases the risk of sexually transmitted infections (STIs). Getting treated can help reduce this risk. Treatment is very important for pregnant women because this condition can cause babies to be born early (prematurely) or at low birth weight. This condition is treated with antibiotic medicines. These may be given as a pill, a vaginal cream, or a medicine that is put into the vagina (suppository). This information is not intended to replace advice given to you by your health care provider. Make sure you discuss any questions you have with your health care provider. Document Revised: 08/13/2019 Document Reviewed: 08/13/2019 Elsevier Patient Education  2024 [employer].

## 2022-12-24 NOTE — Progress Notes (Unsigned)
GYNECOLOGY  VISIT   HPI: 62 y.o.   Married  [place]  female   [phone] with No LMP recorded. Patient has had a hysterectomy.   here for   discharge and odor. Burning, not with urination. Pt noticed this started a few weeks ago. Was previously using a cream prescribed by Dr. Oscar La and felt this helped alleviate, but is unsure what it was.  She believes it was Metrogel.   Discharge is tan in color.    Burning if she washes the area.   Increased sweating.   Wears a pad constantly.   Used Bactrim DS for hidradenitis breakthrough symptoms this month.   Taking Humira.   No self treatment with OTC meds.   Using [employer].  Not sexually active for years.   Seen earlier this year for stool in the vagina and concern for a colovaginal fistula.  Her work up was negative, and this resolved.  She did not have surgery.   She is asking about refill of her transdermal estrogens.  GYNECOLOGIC HISTORY: No LMP recorded. Patient has had a hysterectomy. Contraception:  hyst Menopausal hormone therapy:  n/a Last mammogram:  05/29/22 Breast Density Cat B, BI-RADS CAT 1 neg Last pap smear:   08/14/18 neg        OB History     Gravida  2   Para  1   Term      Preterm  1   AB  1   Living  2      SAB      IAB  1   Ectopic      Multiple  1   Live Births  2              Patient Active Problem List   Diagnosis Date Noted   Generalized anxiety disorder 10/14/2022   Prediabetes 09/13/2022   Abnormal metabolism 09/13/2022   Polypharmacy 08/13/2022   Status post total right knee replacement 07/13/2022   Weight gain 07/12/2022   Lymphocytic colitis 07/12/2022   Migraine without aura and without status migrainosus, not intractable 07/02/2022   Chronic conjunctivitis of both eyes 06/28/2022   Generalized abdominal pain 05/24/2022   Chronic rhinitis 05/09/2022   Chronic migraine without aura without status migrainosus, not intractable 12/26/2021   Depressed mood  12/19/2021   Obstructive sleep apnea 12/19/2021   Other hypoglycemia-post bariatric 12/05/2021   Secondary hyperparathyroidism (HCC) 12/05/2021   Inadequate fluid intake 12/05/2021   Inadequate sleep hygiene 12/05/2021   Class 3 severe obesity with body mass index (BMI) of 40.0 to 44.9 in adult [facility]) 12/05/2021   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   SOBOE (shortness of breath on exertion) 11/21/2021   Other fatigue 11/21/2021   Vitamin D deficiency 11/21/2021   Iron deficiency 11/21/2021   Hypoglycemia after bariatric surgery 11/21/2021   Depression screening 11/21/2021   Class 3 severe obesity with serious comorbidity and body mass index (BMI) of 40.0 to 44.9 in adult (HCC) 11/21/2021   Mild obstructive sleep apnea 11/17/2021   Vertigo 11/17/2021   Excessive daytime sleepiness 10/23/2021   Spinal stenosis of lumbar region 07/07/2020   Displacement of lumbar intervertebral disc 06/02/2020   Lumbar spondylosis 06/02/2020   Lumbar stenosis with neurogenic claudication 06/02/2020   Arm numbness 05/30/2020   Chronic neck pain 05/30/2020   Other chronic pain 05/30/2020   Elevated blood-pressure reading, without diagnosis of hypertension 05/30/2020   Status post arthroscopy of right knee 12/11/2019   Polyethylene wear  of left knee joint prosthesis (HCC) 09/09/2019   Acute medial meniscal tear, right, subsequent encounter 08/27/2019   History of total left knee replacement 07/16/2019   Peroneal neuropathy at knee, left 06/25/2019   Lumbosacral radiculopathy 06/07/2019   Iron malabsorption 10/16/2018   IDA (iron deficiency anemia) 10/16/2018   OA (osteoarthritis) of knee 01/20/2018   Acute meniscal tear, medial 07/17/2017   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   Failed total hip arthroplasty, 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/2017   Pain in left knee 04/11/2017   Insomnia 11/08/2016   Pernicious anemia 11/08/2016   Leukopenia 07/09/2016   Hypocupremia 07/09/2016   Palpitations  04/12/2016   Shortness of breath 04/12/2016   Reactive hypoglycemia 10/31/2015   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/31/2015   Lap gastric bypass May 2016 07/06/2014   History of Clostridium difficile infection 08/12/2011   Post-surgical hypothyroidism 08/26/2010   Gastroesophageal reflux disease 08/26/2010   Vitamin D deficiency 08/26/2010   Fibrocystic disease of breast 08/26/2010   Cobalamin deficiency 08/26/2010   Morbid obesity (HCC) 03/20/2010   Backache 03/20/2010    Past Medical History:  Diagnosis Date   Anxiety    Back pain    Bursitis    right shoulder   Chronic kidney disease    hematuria, has been worked up, her normal   Chronic leukopenia    intermittant since 2010, followed by Dr. Lenord Fellers now   Chronic neck pain    Constipation    Degenerative joint disease of low back 09/02/2015   Dr. Despina Hick   Displacement of lumbar intervertebral disc    Dysrhythmia    Tachycardia, PVCs, PACs   Edema of both lower legs    Fibrocystic breast    Gallbladder problem    GERD (gastroesophageal reflux disease)    Headache    per pt more stress/tension   Heart palpitations    SEE EPIC ENCOUTNER , CARDIOLOGY DR. Gloris Manchester TURNER 2018; reports on 05-16-17 " i haven't had any bouts of those lately"     Hemorrhoids    Hidradenitis suppurativa    History of Clostridium difficile infection 12/2010   History of exercise intolerance    ETT on 04-27-2016-- negative ([facility] treadmill score 7)   History of Helicobacter pylori infection 2001 and 09/ 2012   HSV-2 infection    genital   Hx of adenomatous colonic polyps 10/17/2007   Hydradenitis    per pt currently treated with Humira injection   Hypertension    Hyperthyroidism    Hypocupremia    Hypothyroidism    Hypothyroidism, postsurgical 1980   IBS (irritable bowel syndrome)    Insomnia    Iron deficiency    Joint pain    Leukopenia    Lumbosacral radiculopathy    Lymphocytic colitis 05/14/2022   dx from colonoscopy   Medial  meniscus tear    right knee   Mild obstructive sleep apnea    Mitral regurgitation    Mild to moderate by echo 09/2021   Numbness and tingling of right arm    Obesity    Osteoarthritis    Palpitations    Pernicious anemia    b12 def   Peroneal neuropathy    PONV (postoperative nausea and vomiting)    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   Prediabetes    Reactive hypoglycemia followed by pcp   post gastrectomy dumping syndrome   SOB (shortness of breath)    Spinal stenosis    Tendonitis    right shoulder   Varicose  veins    Vertigo    Vitamin B 12 deficiency    Vitamin D deficiency     Past Surgical History:  Procedure Laterality Date   BREATH TEK H PYLORI N/A 05/03/2014   Procedure: BREATH TEK H PYLORI;  Surgeon: Wenda Low, MD;  Location: [facility];  Service: General;  Laterality: N/A;   CARDIOVASCULAR STRESS TEST  03/11/2013   dr Gloris Manchester turner   Low risk nuclear study w/ a very small anterior perfusion defect that most likely represents breast attenuation artifact, less likely this could represent a true perfusion abnormality in the distribution of diagonal branch of the LAD/  normal LV function and wall motion , ef 66%   CATARACT EXTRACTION W/ INTRAOCULAR LENS  IMPLANT, BILATERAL  10/2017   CESAREAN SECTION  1985   CHOLECYSTECTOMY OPEN  1987   COLONOSCOPY  02/2017   Dr Loreta Ave ; per patient they found 7 polyps with polypectomy; on 5 year track    ESOPHAGOGASTRODUODENOSCOPY  02/01/2021   FINE NEEDLE ASPIRATION Left 05/22/2017   Procedure: LEFT KNEE ASPIRATION;  Surgeon: Ollen Gross, MD;  Location: [facility];  Service: Orthopedics;  Laterality: Left;   GASTRIC ROUX-EN-Y N/A 07/06/2014   Procedure: LAPAROSCOPIC ROUX-EN-Y GASTRIC BYPASS, ENTEROLYSIS OF ADHESIONS WITH UPPER ENDOSCOPY;  Surgeon: Luretha Murphy, MD;  Location: [facility];  Service: General;  Laterality: N/A;   HAMMER TOE SURGERY Bilateral 02/17/2008   bilateral foot digit's 2 and 5   HIATAL HERNIA REPAIR N/A  07/06/2014   Procedure: LAPAROSCOPIC REPAIR OF HIATAL HERNIA;  Surgeon: Luretha Murphy, MD;  Location: [facility];  Service: General;  Laterality: N/A;   I & D KNEE WITH POLY EXCHANGE Left 12/11/2019   Procedure: LEFT KNEE POLY-LINER EXCHANGE;  Surgeon: Kathryne Hitch, MD;  Location: [facility];  Service: Orthopedics;  Laterality: Left;   KNEE ARTHROSCOPY Right 12/11/2019   Procedure: RIGHT KNEE ARTHROSCOPY WITH PARTIAL MEDIAL MENISCECTOMY;  Surgeon: Kathryne Hitch, MD;  Location: [facility];  Service: Orthopedics;  Laterality: Right;   KNEE ARTHROSCOPY WITH MEDIAL MENISECTOMY Left 07/17/2017   Procedure: LEFT KNEE ARTHROSCOPY WITH MEDIAL LATERAL MENISECTOMY;  Surgeon: Ollen Gross, MD;  Location: [facility];  Service: Orthopedics;  Laterality: Left;   MASS EXCISION N/A 10/11/2016   Procedure: EXCISION OF PERIANAL MASS;  Surgeon: Romie Levee, MD;  Location: [facility];  Service: General;  Laterality: N/A;   PARTIAL HYSTERECTOMY  2006   has ovaries   REFRACTIVE SURGERY     THYROIDECTOMY  1980   TOTAL ABDOMINAL HYSTERECTOMY  08-25-2002   dr Myrlene Broker   ovaries retained   TOTAL HIP ARTHROPLASTY Right 05/21/2012   Procedure: TOTAL HIP ARTHROPLASTY;  Surgeon: Loanne Drilling, MD;  Location: [facility];  Service: Orthopedics;  Laterality: Right;   TOTAL HIP ARTHROPLASTY Left 01-31-2009  dr Lequita Halt   TOTAL HIP REVISION Left 05/22/2017   Procedure: Left hip bearing surface and head revision;  Surgeon: Ollen Gross, MD;  Location: [facility];  Service: Orthopedics;  Laterality: Left;   TOTAL KNEE ARTHROPLASTY Left 01/20/2018   Procedure: LEFT TOTAL KNEE ARTHROPLASTY;  Surgeon: Ollen Gross, MD;  Location: [facility];  Service: Orthopedics;  Laterality: Left;    TOTAL KNEE ARTHROPLASTY Right 07/13/2022   Procedure: RIGHTTOTAL KNEE ARTHROPLASTY;  Surgeon: Kathryne Hitch, MD;  Location: [facility];  Service: Orthopedics;  Laterality: Right;   TRANSTHORACIC ECHOCARDIOGRAM   05/03/2016   ef 55-60%/  trivial MR/  mild TR   TUBAL LIGATION  Current Outpatient Medications  Medication Sig Dispense Refill   adalimumab (HUMIRA, 2 PEN,) 40 MG/0.8ML AJKT pen Inject 1 pen subcutaneously every week 4 each 5   adalimumab (HUMIRA, 2 PEN,) 40 MG/0.8ML PNKT pen Inject 1 pen subcutaneously every week 4 each 5   albuterol (VENTOLIN HFA) 108 (90 Base) MCG/ACT inhaler Inhale 2 puffs into the lungs every 4 hours as needed for wheezing or shortness of breath. 18 g 3   amoxicillin (AMOXIL) 500 MG capsule Take 2 capsules by mouth 1 hours prior to dental appointment then 2 capsules 6 hours after as directed 8 capsule 0   budesonide (ENTOCORT EC) 3 MG 24 hr capsule Take 3 capsules (9 mg total) by mouth in the morning. 270 capsule 3   buPROPion (WELLBUTRIN XL) 150 MG 24 hr tablet Take 1 tablet (150 mg total) by mouth daily. 90 tablet 0   Carbinoxamine Maleate 4 MG TABS Take 1-2 tablets at night as needed for drainage and allergies. May cause drowsiness. 28 tablet 5   doxepin (SINEQUAN) 50 MG capsule Take 1 capsule (50 mg total) by mouth at bedtime. 90 capsule 0   ergocalciferol (DRISDOL) 1.25 MG (50000 UT) capsule Take 1 capsule (50,000 Units total) by mouth once a week. 12 capsule 1   escitalopram (LEXAPRO) 10 MG tablet Take 1 tablet (10 mg total) by mouth daily. 90 tablet 1   Estradiol 0.75 MG/1.25 GM (0.06%) topical gel Place 1.25 g onto the skin every other day. 50 g 0   flecainide (TAMBOCOR) 50 MG tablet Take 1 tablet (50 mg total) by mouth daily at 6 (six) AM. 90 tablet 3   gabapentin (NEURONTIN) 600 MG tablet Take 1 tablet (600 mg total) by mouth 3 (three) times daily. 90 tablet 5   Galcanezumab-gnlm (EMGALITY) 120 MG/ML SOAJ Inject 120 mg into the skin every 30 (thirty) days. 1.12 mL 11   HYDROcodone-acetaminophen (NORCO/VICODIN) 5-325 MG tablet Take 1 tablet by mouth every 6 (six) hours as needed for severe pain. 12 tablet 0   ipratropium (ATROVENT) 0.03 % nasal spray Use 1-2  sprays in each nostril up to 3 times daily as needed for runny nose. Can take 15 minutes prior to meals. 30 mL 3   levothyroxine (SYNTHROID) 112 MCG tablet Take 1 tablet (112 mcg total) by mouth daily. 90 tablet 1   LORazepam (ATIVAN) 1 MG tablet Take 1 tablet (1 mg total) by mouth 2 (two) times daily as needed for anxiety. 60 tablet 1   metoprolol succinate (TOPROL-XL) 25 MG 24 hr tablet Take 1 tablet (25 mg total) by mouth daily. 90 tablet 3   Olopatadine HCl 0.2 % SOLN Apply 1 drop to eye daily as needed. (Patient taking differently: Apply 1 drop to eye daily as needed (allergies).) 2.5 mL 5   ondansetron (ZOFRAN-ODT) 4 MG disintegrating tablet Take 1 tablet (4 mg total) by mouth every 8 (eight) hours as needed for nausea or vomiting. 20 tablet 0   promethazine (PHENERGAN) 25 MG tablet Take 25 mg by mouth every 6 (six) hours as needed for nausea or vomiting.     promethazine (PHENERGAN) 25 MG tablet Take 1 tablet (25 mg total) by mouth every 8 (eight) hours as needed for nausea and/or vomiting 30 tablet 5   sulfamethoxazole-trimethoprim (BACTRIM DS) 800-160 MG tablet Take 1 tablet by mouth 2 (two) times daily as needed for flare 60 tablet 1   terconazole (TERAZOL 7) 0.4 % vaginal cream Place 1 applicatorful vaginally at bedtime for  seven nights.   Then use one applicatorful once a week for 6 months. 45 g 4   tiZANidine (ZANAFLEX) 4 MG capsule Take 1 capsule (4 mg total) by mouth 3 (three) times daily as needed for muscle spasms. 90 capsule 1   triamcinolone cream (KENALOG) 0.1 % Apply 1 Application topically 2 (two) times daily. Use for 2 weeks, and then off for 2 weeks as needed. 80 g 1   Ubrogepant (UBRELVY) 100 MG TABS Take 1 tablet (100 mg total) by mouth every 2 (two) hours as needed. Maximum 200mg  a day. 16 tablet 11   No current facility-administered medications for this visit.   Facility-Administered Medications Ordered in Other Visits  Medication Dose Route Frequency Provider Last Rate  Last Admin   gadobenate dimeglumine (MULTIHANCE) injection 20 mL  20 mL Intravenous Once PRN Anson Fret, MD         ALLERGIES: Other and Estradiol  Family History  Problem Relation Age of Onset   High blood pressure Mother    Hypertension Mother    Headache Mother    High Cholesterol Mother    Thyroid disease Mother    Obesity Mother    Kidney disease Mother    Hypertension Father    Diabetes Father    High blood pressure Father    High Cholesterol Father    Heart disease Father    Obesity Father    Diabetes Sister    Hypertension Sister    Thyroid disease Brother    Thyroid disease Maternal Aunt    Migraines Neg Hx     Social History   Socioeconomic History   Marital status: Married    Spouse name: Thereasa Distance   Number of children: 2   Years of education: Not on file   Highest education level: Not on file  Occupational History   Occupation: Charity fundraiser - Adult nurse GI    Employer: [employer]  Tobacco Use   Smoking status: Never   Smokeless tobacco: Never  Vaping Use   Vaping status: Never Used  Substance and Sexual Activity   Alcohol use: No   Drug use: No   Sexual activity: Not Currently    Partners: Male    Birth control/protection: Surgical    Comment: hysterectomy  Other Topics Concern   Not on file  Social History Narrative   Lives at home with spouse   Right handed   Caffeine: diet zero mtn dew, occasionally    Social Determinants of Health   Financial Resource Strain: Not on file  Food Insecurity: No Food Insecurity (07/13/2022)   Hunger Vital Sign    Worried About Running Out of Food in the Last Year: Never true    Ran Out of Food in the Last Year: Never true  Transportation Needs: No Transportation Needs (07/13/2022)   PRAPARE - Administrator, Civil Service (Medical): No    Lack of Transportation (Non-Medical): No  Physical Activity: Not on file  Stress: Not on file  Social Connections: Not on file  Intimate Partner Violence: Not At  Risk (07/13/2022)   Humiliation, Afraid, Rape, and Kick questionnaire    Fear of Current or Ex-Partner: No    Emotionally Abused: No    Physically Abused: No    Sexually Abused: No    Review of Systems  Genitourinary:  Positive for vaginal discharge.    PHYSICAL EXAMINATION:    BP 128/86 (BP Location: Left Arm, Patient Position: Sitting, Cuff Size: Large)  Pulse 68   Ht 5' 5.5" (1.664 m)   Wt 295 lb (133.8 kg)   SpO2 98%   BMI 48.34 kg/m     General appearance: alert, cooperative and appears stated age  Pelvic: External genitalia:  no lesions              Urethra:  normal appearing urethra with no masses, tenderness or lesions              Bartholins and Skenes: normal                 Vagina: normal appearing vagina with normal color and light yellow creamy discharge, no lesions              Cervix: absent                Bimanual Exam:  Uterus:  absent              Adnexa: no mass, fullness, tenderness       Chaperone was present for exam:  Warren Lacy, CMA  ASSESSMENT  Vaginal discharge.   Bacterial vaginosis.  ERT.   PLAN  We discussed bacterial vaginosis.  Wet prep:  Clue cells present, no yeast, no trichomonas.  Metrogel.  Rx refill for estradiol gel.  Keep annual exam appointment for December.   20  total time was spent for this patient encounter, including preparation, face-to-face counseling with the patient, coordination of care, and documentation of the encounter.

## 2022-12-25 ENCOUNTER — Other Ambulatory Visit (HOSPITAL_COMMUNITY): Payer: Self-pay

## 2022-12-25 ENCOUNTER — Other Ambulatory Visit: Payer: Self-pay

## 2022-12-25 DIAGNOSIS — G4733 Obstructive sleep apnea (adult) (pediatric): Secondary | ICD-10-CM | POA: Diagnosis not present

## 2022-12-25 MED ORDER — CYANOCOBALAMIN 1000 MCG/ML IJ SOLN
INTRAMUSCULAR | 11 refills | Status: AC
  Filled 2022-12-25: qty 3, 84d supply, fill #0

## 2022-12-26 ENCOUNTER — Other Ambulatory Visit (HOSPITAL_COMMUNITY): Payer: Self-pay

## 2023-01-01 ENCOUNTER — Other Ambulatory Visit (HOSPITAL_COMMUNITY): Payer: Self-pay

## 2023-01-03 ENCOUNTER — Other Ambulatory Visit: Payer: 59

## 2023-01-04 ENCOUNTER — Other Ambulatory Visit: Payer: Self-pay

## 2023-01-04 ENCOUNTER — Encounter (HOSPITAL_COMMUNITY): Payer: Self-pay

## 2023-01-04 ENCOUNTER — Encounter: Payer: Self-pay | Admitting: Family

## 2023-01-04 ENCOUNTER — Other Ambulatory Visit (HOSPITAL_COMMUNITY): Payer: Self-pay

## 2023-01-04 NOTE — Progress Notes (Signed)
Patient has new insurance must fill specialty medication with [facility] patient is aware

## 2023-01-07 ENCOUNTER — Encounter: Payer: 59 | Admitting: Orthopaedic Surgery

## 2023-01-11 ENCOUNTER — Ambulatory Visit: Payer: 59 | Admitting: Internal Medicine

## 2023-01-14 ENCOUNTER — Ambulatory Visit: Payer: Self-pay | Admitting: Family

## 2023-01-14 ENCOUNTER — Other Ambulatory Visit: Payer: Self-pay

## 2023-01-21 ENCOUNTER — Encounter: Payer: Self-pay | Admitting: Family

## 2023-01-22 ENCOUNTER — Encounter: Payer: Self-pay | Admitting: Family

## 2023-01-25 DIAGNOSIS — G4733 Obstructive sleep apnea (adult) (pediatric): Secondary | ICD-10-CM | POA: Diagnosis not present

## 2023-01-29 NOTE — Progress Notes (Signed)
62 y.o. G2P0112 Married [place] American female here for annual exam.    She is not using the Divigel a lot.  Last used it a couple of weeks ago.  Had gastric bypass.  She got a rash from using a transdermal patch.  Has hot flashes when she does not use the Divigel.   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 Has not taken Valtrex in years.   States she feels tired all of the time.  Saw her PCP and is now on Wellbutrin 150 mg and Lexapro.  Spending a lot of time in bed.  Not sleeping well.  Stressed and overwhelmed.  Not suicidal.  Her husband is supportive of her doing counseling.   Retired and adjusting.  Mother had a hip replacement and is staying the the patient.   PCP: Margaree Mackintosh, MD   No LMP recorded. Patient has had a hysterectomy.           Sexually active: No.  The current method of family planning is status post hysterectomy.    Menopausal hormone therapy:  estradiol gel Exercising: No.   Smoker:  no  OB History  Gravida Para Term Preterm AB Living  2 1  1 1 2   SAB IAB Ectopic Multiple Live Births   1  1 2     # Outcome Date GA Lbr Len/2nd Weight Sex Type Anes PTL Lv  2A Preterm 07/15/83    F CS-LTranv   LIV  2B Preterm 07/15/83    F    LIV  1 IAB              HEALTH MAINTENANCE: Last 2 paps:  08/14/18 neg, 02/11/12 neg History of abnormal Pap or positive HPV:  no Mammogram:   05/29/22 Breast Density Cat B, BI-RADS CAT 1 neg Colonoscopy:  05/14/22 Bone Density:  03/24/18  Result  normal   Immunization History  Administered Date(s) Administered   Influenza Split 11/22/2014   Influenza, Seasonal, Injecte, Preservative Fre 12/24/2022   Influenza,inj,Quad PF,6+ Mos 11/12/2013, 12/16/2015, 11/21/2021   Influenza,inj,quad, With Preservative 10/27/2016   Influenza-Unspecified 11/29/2020   PFIZER(Purple Top)SARS-COV-2 Vaccination 03/19/2019, 04/08/2019, 01/08/2020   Tdap 01/10/2012, 07/12/2021   Zoster Recombinant(Shingrix) 02/01/2020, 04/07/2020       reports that she has never smoked. She has never used smokeless tobacco. She reports that she does not drink alcohol and does not use drugs.  Past Medical History:  Diagnosis Date   Anxiety    Back pain    Bursitis    right shoulder   Chronic kidney disease    hematuria, has been worked up, her normal   Chronic leukopenia    intermittant since 2010, followed by Dr. Lenord Fellers now   Chronic neck pain    Constipation    Degenerative joint disease of low back 09/02/2015   Dr. Despina Hick   Displacement of lumbar intervertebral disc    Dysrhythmia    Tachycardia, PVCs, PACs   Edema of both lower legs    Fibrocystic breast    Gallbladder problem    GERD (gastroesophageal reflux disease)    Headache    per pt more stress/tension   Heart palpitations    SEE EPIC ENCOUTNER , CARDIOLOGY DR. Gloris Manchester TURNER 2018; reports on 05-16-17 " i haven't had any bouts of those lately"     Hemorrhoids    Hidradenitis suppurativa    History of Clostridium difficile infection 12/2010   History of exercise intolerance    ETT on 04-27-2016--  negative ([facility] treadmill score 7)   History of Helicobacter pylori infection 2001 and 09/ 2012   HSV-2 infection    genital   Hx of adenomatous colonic polyps 10/17/2007   Hydradenitis    per pt currently treated with Humira injection   Hypertension    Hyperthyroidism    Hypocupremia    Hypothyroidism    Hypothyroidism, postsurgical 1980   IBS (irritable bowel syndrome)    Insomnia    Iron deficiency    Joint pain    Leukopenia    Lumbosacral radiculopathy    Lymphocytic colitis 05/14/2022   dx from colonoscopy   Medial meniscus tear    right knee   Mild obstructive sleep apnea    Mitral regurgitation    Mild to moderate by echo 09/2021   Numbness and tingling of right arm    Obesity    Osteoarthritis    Palpitations    Pernicious anemia    b12 def   Peroneal neuropathy    PONV (postoperative nausea and vomiting)    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   Prediabetes    Reactive hypoglycemia followed by pcp   post gastrectomy dumping syndrome   SOB (shortness of breath)    Spinal stenosis    Tendonitis    right shoulder   Varicose veins    Vertigo    Vitamin B 12 deficiency    Vitamin D deficiency     Past Surgical History:  Procedure Laterality Date   BREATH TEK H PYLORI N/A 05/03/2014   Procedure: BREATH TEK H PYLORI;  Surgeon: Wenda Low, MD;  Location: [facility];  Service: General;  Laterality: N/A;   CARDIOVASCULAR STRESS TEST  03/11/2013   dr Gloris Manchester turner   Low risk nuclear study w/ a very small anterior perfusion defect that most likely represents breast attenuation artifact, less likely this could represent a true perfusion abnormality in the distribution of diagonal branch of the LAD/  normal LV function and wall motion , ef 66%   CATARACT EXTRACTION W/ INTRAOCULAR LENS  IMPLANT, BILATERAL  10/2017   CESAREAN SECTION  1985   CHOLECYSTECTOMY OPEN  1987   COLONOSCOPY  02/2017   Dr Loreta Ave ; per patient they found 7 polyps with polypectomy; on 5 year track    ESOPHAGOGASTRODUODENOSCOPY  02/01/2021   FINE NEEDLE ASPIRATION Left 05/22/2017   Procedure: LEFT KNEE ASPIRATION;  Surgeon: Ollen Gross, MD;  Location: [facility];  Service: Orthopedics;  Laterality: Left;   GASTRIC ROUX-EN-Y N/A 07/06/2014   Procedure: LAPAROSCOPIC ROUX-EN-Y GASTRIC BYPASS, ENTEROLYSIS OF ADHESIONS WITH UPPER ENDOSCOPY;  Surgeon: Luretha Murphy, MD;  Location: [facility];  Service: General;  Laterality: N/A;   HAMMER TOE SURGERY Bilateral 02/17/2008   bilateral foot digit's 2 and 5   HIATAL HERNIA REPAIR N/A 07/06/2014   Procedure: LAPAROSCOPIC REPAIR OF HIATAL HERNIA;  Surgeon: Luretha Murphy, MD;  Location: [facility];  Service: General;  Laterality: N/A;   I & D KNEE WITH POLY EXCHANGE Left 12/11/2019   Procedure: LEFT KNEE POLY-LINER EXCHANGE;  Surgeon: Kathryne Hitch, MD;  Location: [facility];  Service: Orthopedics;  Laterality: Left;   KNEE  ARTHROSCOPY Right 12/11/2019   Procedure: RIGHT KNEE ARTHROSCOPY WITH PARTIAL MEDIAL MENISCECTOMY;  Surgeon: Kathryne Hitch, MD;  Location: [facility];  Service: Orthopedics;  Laterality: Right;   KNEE ARTHROSCOPY WITH MEDIAL MENISECTOMY Left 07/17/2017   Procedure: LEFT KNEE ARTHROSCOPY WITH MEDIAL LATERAL MENISECTOMY;  Surgeon: Ollen Gross, MD;  Location: [facility];  Service: Orthopedics;  Laterality: Left;   MASS EXCISION N/A 10/11/2016   Procedure: EXCISION OF PERIANAL MASS;  Surgeon: Romie Levee, MD;  Location[facility];  Service: General;  Laterality: N/A;   PARTIAL HYSTERECTOMY  2006   has ovaries   REFRACTIVE SURGERY     THYROIDECTOMY  1980   TOTAL ABDOMINAL HYSTERECTOMY  08-25-2002   dr Myrlene Broker   ovaries retained   TOTAL HIP ARTHROPLASTY Right 05/21/2012   Procedure: TOTAL HIP ARTHROPLASTY;  Surgeon: Loanne Drilling, MD;  Location: [facility];  Service: Orthopedics;  Laterality: Right;   TOTAL HIP ARTHROPLASTY Left 01-31-2009  dr Lequita Halt   TOTAL HIP REVISION Left 05/22/2017   Procedure: Left hip bearing surface and head revision;  Surgeon: Ollen Gross, MD;  Location: [facility];  Service: Orthopedics;  Laterality: Left;   TOTAL KNEE ARTHROPLASTY Left 01/20/2018   Procedure: LEFT TOTAL KNEE ARTHROPLASTY;  Surgeon: Ollen Gross, MD;  Location: [facility];  Service: Orthopedics;  Laterality: Left;    TOTAL KNEE ARTHROPLASTY Right 07/13/2022   Procedure: RIGHTTOTAL KNEE ARTHROPLASTY;  Surgeon: Kathryne Hitch, MD;  Location: [facility];  Service: Orthopedics;  Laterality: Right;   TRANSTHORACIC ECHOCARDIOGRAM  05/03/2016   ef 55-60%/  trivial MR/  mild TR   TUBAL LIGATION      Current Outpatient Medications  Medication Sig Dispense Refill   adalimumab (HUMIRA, 2 PEN,) 40 MG/0.8ML AJKT pen Inject 1 pen subcutaneously every week 4 each 5   albuterol (VENTOLIN HFA) 108 (90 Base) MCG/ACT inhaler Inhale 2 puffs into the lungs every 4 hours as needed for  wheezing or shortness of breath. 18 g 3   amoxicillin (AMOXIL) 500 MG capsule Take 2 capsules by mouth 1 hours prior to dental appointment then 2 capsules 6 hours after as directed 8 capsule 0   budesonide (ENTOCORT EC) 3 MG 24 hr capsule Take 3 capsules (9 mg total) by mouth in the morning. 270 capsule 3   buPROPion (WELLBUTRIN XL) 150 MG 24 hr tablet Take 1 tablet (150 mg total) by mouth daily. 90 tablet 0   Carbinoxamine Maleate 4 MG TABS Take 1-2 tablets at night as needed for drainage and allergies. May cause drowsiness. 28 tablet 5   cyanocobalamin (DODEX) 1000 MCG/ML injection INJECT 1 ML INTRAMUSCULARLY EVERY MONTH 3 mL 11   doxepin (SINEQUAN) 50 MG capsule Take 1 capsule (50 mg total) by mouth at bedtime. 90 capsule 0   ergocalciferol (DRISDOL) 1.25 MG (50000 UT) capsule Take 1 capsule (50,000 Units total) by mouth once a week. 12 capsule 1   escitalopram (LEXAPRO) 10 MG tablet Take 1 tablet (10 mg total) by mouth daily. 90 tablet 1   Estradiol 0.75 MG/1.25 GM (0.06%) topical gel Place 1 pump (1.25 g) onto the skin every other day. 50 g 0   flecainide (TAMBOCOR) 50 MG tablet Take 1 tablet (50 mg total) by mouth daily at 6 (six) AM. 90 tablet 3   gabapentin (NEURONTIN) 600 MG tablet Take 1 tablet (600 mg total) by mouth 3 (three) times daily. 90 tablet 5   Galcanezumab-gnlm (EMGALITY) 120 MG/ML SOAJ Inject 120 mg into the skin every 30 (thirty) days. 1.12 mL 11   HYDROcodone-acetaminophen (NORCO/VICODIN) 5-325 MG tablet Take 1 tablet by mouth every 6 (six) hours as needed for severe pain. 12 tablet 0   ipratropium (ATROVENT) 0.03 % nasal spray Use 1-2 sprays in each nostril up to 3 times daily as needed for runny  nose. Can take 15 minutes prior to meals. 30 mL 3   levothyroxine (SYNTHROID) 112 MCG tablet Take 1 tablet (112 mcg total) by mouth daily. 90 tablet 1   LORazepam (ATIVAN) 1 MG tablet Take 1 tablet (1 mg total) by mouth 2 (two) times daily as needed for anxiety. 60 tablet 1    metoprolol succinate (TOPROL-XL) 25 MG 24 hr tablet Take 1 tablet (25 mg total) by mouth daily. 90 tablet 3   metroNIDAZOLE (METROGEL) 0.75 % vaginal gel Place 1 Applicatorful vaginally at bedtime 5 nights in a row. 70 g 0   Olopatadine HCl 0.2 % SOLN Apply 1 drop to eye daily as needed. (Patient taking differently: Apply 1 drop to eye daily as needed (allergies).) 2.5 mL 5   ondansetron (ZOFRAN-ODT) 4 MG disintegrating tablet Take 1 tablet (4 mg total) by mouth every 8 (eight) hours as needed for nausea or vomiting. 20 tablet 0   promethazine (PHENERGAN) 25 MG tablet Take 25 mg by mouth every 6 (six) hours as needed for nausea or vomiting.     promethazine (PHENERGAN) 25 MG tablet Take 1 tablet (25 mg total) by mouth every 8 (eight) hours as needed for nausea and/or vomiting 30 tablet 5   tiZANidine (ZANAFLEX) 4 MG capsule Take 1 capsule (4 mg total) by mouth 3 (three) times daily as needed for muscle spasms. 90 capsule 1   triamcinolone cream (KENALOG) 0.1 % Apply 1 Application topically 2 (two) times daily. Use for 2 weeks, and then off for 2 weeks as needed. 80 g 1   Ubrogepant (UBRELVY) 100 MG TABS Take 1 tablet (100 mg total) by mouth every 2 (two) hours as needed. Maximum 200mg  a day. 16 tablet 11   No current facility-administered medications for this visit.   Facility-Administered Medications Ordered in Other Visits  Medication Dose Route Frequency Provider Last Rate Last Admin   gadobenate dimeglumine (MULTIHANCE) injection 20 mL  20 mL Intravenous Once PRN Anson Fret, MD        ALLERGIES: Other and Estradiol  Family History  Problem Relation Age of Onset   High blood pressure Mother    Hypertension Mother    Headache Mother    High Cholesterol Mother    Thyroid disease Mother    Obesity Mother    Kidney disease Mother    Hypertension Father    Diabetes Father    High blood pressure Father    High Cholesterol Father    Heart disease Father    Obesity Father     Diabetes Sister    Hypertension Sister    Thyroid disease Brother    Thyroid disease Maternal Aunt    Migraines Neg Hx     Review of Systems  All other systems reviewed and are negative.   PHYSICAL EXAM:  BP 130/84 (BP Location: Right Arm, Patient Position: Sitting, Cuff Size: Large)   Pulse 65   Ht 5' 5.5" (1.664 m)   Wt 297 lb (134.7 kg)   SpO2 97%   BMI 48.67 kg/m     General appearance: alert, cooperative and appears stated age Head: normocephalic, without obvious abnormality, atraumatic Neck: no adenopathy, supple, symmetrical, trachea midline and thyroid absent.  No masses. Lungs: clear to auscultation bilaterally Breasts: normal appearance, no masses or tenderness, No nipple retraction or dimpling, No nipple discharge or bleeding, No axillary adenopathy Heart: regular rate and rhythm Abdomen: soft, non-tender; no masses, no organomegaly Extremities: extremities normal, atraumatic, no cyanosis or edema  Skin: skin color, texture, turgor normal. No rashes or lesions Lymph nodes: cervical, supraclavicular, and axillary nodes normal. Neurologic: grossly normal  Pelvic: External genitalia:  no lesions              No abnormal inguinal nodes palpated.              Urethra:  normal appearing urethra with no masses, tenderness or lesions              Bartholins and Skenes: normal                 Vagina: normal appearing vagina with normal color and discharge, no lesions              Cervix: absent              Pap taken: No.  Not indicated.  Bimanual Exam:  Uterus:  normal size, contour, position, consistency, mobility, non-tender              Adnexa: no mass, fullness, tenderness              Rectal exam: yes.  Confirms.              Anus:  normal sphincter tone, no lesions  Chaperone was present for exam:  Warren Lacy, CMA  ASSESSMENT: Well woman visit with gynecologic exam. Status post total abdominal hysterectomy for fibroids.  Tubes and ovaries remain.  ERT.  Hx  vaginal fistula, spontaneously resolved.  Hx lymphocytic colitis.  HSV 2.  Elevated creatinine.  Hidradenitis.  Hx weight loss surgery.  Secondary hyperparathyroidism.  Hx C Diff.  Anxiety and depression.   PLAN: Mammogram screening discussed. Self breast awareness reviewed. Pap and HRV collected:  No. Guidelines for Calcium, Vitamin D, regular exercise program including cardiovascular and weight bearing exercise. We discussed ERT and potential risks and benefits.  She will try to stop her Divigel.  Medication refills:  NA We discussed counseling through [employer] or [place].    Brochure given for [employer]. Follow up:  1 year and prn.

## 2023-02-04 ENCOUNTER — Ambulatory Visit: Payer: Self-pay | Admitting: Family Medicine

## 2023-02-05 ENCOUNTER — Encounter: Payer: Self-pay | Admitting: Neurology

## 2023-02-05 ENCOUNTER — Telehealth: Payer: Self-pay | Admitting: *Deleted

## 2023-02-05 ENCOUNTER — Telehealth: Payer: Self-pay

## 2023-02-05 ENCOUNTER — Other Ambulatory Visit (HOSPITAL_COMMUNITY): Payer: Self-pay

## 2023-02-05 NOTE — Telephone Encounter (Signed)
Emgality PA needed with new insurance card (under media tab).

## 2023-02-05 NOTE — Telephone Encounter (Signed)
*  Primary  Pharmacy Patient Advocate Encounter   Received notification from CoverMyMeds that prior authorization for tiZANidine HCl 4MG  capsules  is required/requested.   Insurance verification completed.   The patient is insured through [employer] [facility] .   Per test claim:  Tizanidine tablets is preferred by the insurance.  If suggested medication is appropriate, Please send in a new RX and discontinue this one. If not, please advise as to why it's not appropriate so that we may request a Prior Authorization. Please note, some preferred medications may still require a PA

## 2023-02-06 ENCOUNTER — Other Ambulatory Visit (HOSPITAL_COMMUNITY): Payer: Self-pay

## 2023-02-06 ENCOUNTER — Encounter (HOSPITAL_COMMUNITY): Payer: Self-pay | Admitting: Pharmacy Technician

## 2023-02-06 ENCOUNTER — Telehealth: Payer: Self-pay | Admitting: Pharmacy Technician

## 2023-02-06 NOTE — Telephone Encounter (Signed)
She said she is okay with the generic

## 2023-02-06 NOTE — Telephone Encounter (Signed)
error 

## 2023-02-06 NOTE — Telephone Encounter (Signed)
PA request has been Submitted. New Encounter created for follow up. For additional info see Pharmacy Prior Auth telephone encounter from 02/06/2023.

## 2023-02-06 NOTE — Telephone Encounter (Signed)
It won't let me sign it. Please sign.

## 2023-02-06 NOTE — Telephone Encounter (Signed)
Pharmacy Patient Advocate Encounter   Received notification from Pt Calls Messages that prior authorization for Emgality 120MG /ML auto-injectors (migraine) is required/requested.   Insurance verification completed.   The patient is insured through [employer] .   Per test claim: PA required; PA submitted to above mentioned insurance via CoverMyMeds Key/confirmation #/EOC [id number] Status is pending

## 2023-02-07 ENCOUNTER — Telehealth: Payer: Self-pay | Admitting: Internal Medicine

## 2023-02-07 MED ORDER — TIZANIDINE HCL 4 MG PO CAPS: 4.0000 mg | ORAL_CAPSULE | Freq: Three times a day (TID) | ORAL | 1 refills | Status: DC | PRN

## 2023-02-07 NOTE — Telephone Encounter (Signed)
Refilled tizanidine as requested. MJB, MD

## 2023-02-08 ENCOUNTER — Other Ambulatory Visit (HOSPITAL_COMMUNITY): Payer: Self-pay

## 2023-02-08 NOTE — Telephone Encounter (Signed)
Pharmacy Patient Advocate Encounter  Received notification from [employer] that Prior Authorization for Emgality 120MG /ML auto-injectors (migraine  has been APPROVED from 02/06/2023 to 02/06/2024   PA #/Case ID/Reference #: [id number]

## 2023-02-11 ENCOUNTER — Other Ambulatory Visit (HOSPITAL_COMMUNITY): Payer: Self-pay

## 2023-02-12 ENCOUNTER — Ambulatory Visit (INDEPENDENT_AMBULATORY_CARE_PROVIDER_SITE_OTHER): Payer: 59 | Admitting: Obstetrics and Gynecology

## 2023-02-12 ENCOUNTER — Encounter: Payer: Self-pay | Admitting: Obstetrics and Gynecology

## 2023-02-12 ENCOUNTER — Encounter: Payer: Self-pay | Admitting: Family

## 2023-02-12 VITALS — BP 130/84 | HR 65 | Ht 65.5 in | Wt 297.0 lb

## 2023-02-12 DIAGNOSIS — Z01419 Encounter for gynecological examination (general) (routine) without abnormal findings: Secondary | ICD-10-CM

## 2023-02-12 NOTE — Patient Instructions (Signed)

## 2023-02-24 DIAGNOSIS — G4733 Obstructive sleep apnea (adult) (pediatric): Secondary | ICD-10-CM | POA: Diagnosis not present

## 2023-02-25 MED ORDER — TIZANIDINE HCL 4 MG PO TABS: ORAL_TABLET | ORAL | 1 refills | Status: DC

## 2023-02-25 NOTE — Telephone Encounter (Signed)
Request from pharamacy tizanidine from capsules to tables. Ordered signed MJB, MD

## 2023-02-25 NOTE — Telephone Encounter (Signed)
Please see unsigned order in chart for med change from capsules to tablets, thank you!

## 2023-03-11 ENCOUNTER — Encounter: Payer: Self-pay | Admitting: Family

## 2023-03-18 ENCOUNTER — Other Ambulatory Visit: Payer: Self-pay | Admitting: Internal Medicine

## 2023-03-18 ENCOUNTER — Encounter: Payer: Self-pay | Admitting: Internal Medicine

## 2023-03-18 ENCOUNTER — Ambulatory Visit: Payer: 59 | Admitting: Internal Medicine

## 2023-03-18 ENCOUNTER — Encounter: Payer: Self-pay | Admitting: Family

## 2023-03-18 VITALS — BP 122/70 | HR 65 | Ht 65.5 in | Wt 294.8 lb

## 2023-03-18 DIAGNOSIS — E211 Secondary hyperparathyroidism, not elsewhere classified: Secondary | ICD-10-CM | POA: Diagnosis not present

## 2023-03-18 DIAGNOSIS — E890 Postprocedural hypothyroidism: Secondary | ICD-10-CM

## 2023-03-18 DIAGNOSIS — K912 Postsurgical malabsorption, not elsewhere classified: Secondary | ICD-10-CM | POA: Diagnosis not present

## 2023-03-18 NOTE — Progress Notes (Signed)
Patient ID: Victoria [name], female   DOB: Feb 03, 1961, 63 y.o.   MRN: [id number]  HPI  Victoria [name] is a 63 y.o.-year-old female, initially referred by her PCP, Dr. Lenord Fellers, returning for follow-up for secondary hyperparathyroidism and vitamin D deficiency.  She was previously seen by Dr. 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 She is the wife of Bridgid Lizalde, also my pt. with me 6 months ago.  Interim hx: She has intermittent diarrhea from her lymphocytic colitis.  Now on Entocort. She tried to come off but could not. Now only occasional exacerbation. She still has hot flushes.  She is trying to come off HRT, but hot flushes return.  Hyperparathyroidism: Pt was dx with hyperparathyroidism in 10/2021.  No history of hypercalcemia.    I reviewed pt's pertinent labs: Lab Results  Component Value Date   CAION 4.8 03/15/2022   Lab Results  Component Value Date   PTH 51 03/15/2022   PTH 157 (H) 12/07/2021   PTH 101 (H) 11/20/2021   CALCIUM 9.0 10/09/2022   CALCIUM 9.2 09/03/2022   CALCIUM 8.5 (L) 07/18/2022   CALCIUM 8.0 (L) 07/14/2022   CALCIUM 8.6 (L) 07/02/2022   CALCIUM 8.6 (L) 05/24/2022   CALCIUM 9.2 02/20/2022   CALCIUM 8.6 (L) 11/20/2021   CALCIUM 8.2 (L) 08/01/2021   CALCIUM 8.5 (L) 06/29/2021   To investigate of hyperparathyroidism, she had a Technetium sestamibi scan (12/16/2021) showing decreased uptake in the thyroid gland, but no parathyroid adenoma evident.  A 24-hour urine calcium was low: Component     Latest Ref Rng 03/19/2022  Creatinine, 24H Ur     0.50 - 2.15 g/24 h 1.11   Calcium, 24H Urine     mg/24 h 13 (L)   I advised her to start calcium supplementation-Gummies 300 or 500 mg of calcium citrate, which ever she can find (will try not to use calcium carbonate since she may use PPIs-has Protonix at hand).  She previously had constipation with calcium tablets.  Patient has a pertinent history of  R en Y gastric bypass in 2016 >> lost 93 lbs and then gained ~all back.  She is seen in the weight management clinic.  She is currently on supplementation with: - Calcium liquid - 500 mg daily >> forgets many doses - Vitamin D - ergocalciferol 50,000 units 2x a week >> once a week (decreased by PCP - 12/2022)  No fractures or falls.   She had TAH 2002 -went through menopause in her 73s.  She has osteoarthritis.  No h/o kidney stones. She has a cyst >> saw urology.  No h/o CKD. Last BUN/Cr: Lab Results  Component Value Date   BUN 15 10/09/2022   BUN 9 09/03/2022   CREATININE 1.43 (H) 10/09/2022   CREATININE 0.95 09/03/2022   Pt is not on HCTZ.  She has a h/o vitamin D deficiency. Reviewed vit D levels: Lab Results  Component Value Date   VD25OH 50.1 02/20/2022   VD25OH 29.4 (L) 11/20/2021   VD25OH 18.9 (L) 07/07/2020   VD25OH 41 10/02/2018   VD25OH 27 (L) 03/17/2018   VD25OH 32 03/15/2017   VD25OH 34 03/12/2016   VD25OH 30 02/25/2015   VD25OH 38 08/17/2014   VD25OH 19 (L) 02/22/2014   Pt does not have a FH of hypercalcemia, pituitary tumors, thyroid cancer, or osteoporosis.   Pt. 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  Glucose levels were normal  on labs per review of the chart.  She recently had dental work >> drank smoothies >> sugars dropped to 34; no recent lows.  Reviewed HbA1c levels -in the prediabetic range: Lab Results  Component Value Date   HGBA1C 6.1 (H) 12/20/2022   HGBA1C 5.7 (H) 07/05/2022   HGBA1C 5.6 05/24/2022   HGBA1C 5.8 (H) 02/20/2022   HGBA1C 5.7 (H) 11/20/2021   HGBA1C 5.4 06/30/2020   HGBA1C 5.7 (H) 05/16/2020   HGBA1C 5.5 03/15/2017   HGBA1C 5.7 (H) 09/09/2015   HGBA1C 5.7 (H) 08/20/2013   She was previously on acarbose prescribed by Dr. Lucianne Muss.  She contemplated starting a GLP-1 receptor agonist, but was recommended against it due to her history of reactive hypoglycemia.  At last visit I  advised her that she could definitely try this.  She has a history of diabetes in her sister.  She has a h/o Hyperthyroidism in 1970s >> had almost complete thyroidectomy (1980) >> postsurgical hypothyroidism:  Lab Results  Component Value Date   TSH 1.68 12/20/2022   TSH 3.17 07/05/2022   TSH 1.040 02/20/2022   TSH 1.91 06/29/2021   TSH 2.90 12/29/2020   TSH 0.60 06/30/2020   TSH 1.01 03/03/2020   TSH 0.72 01/07/2020   TSH 0.84 04/09/2019   TSH 3.600 09/18/2018   TSH 2.78 03/17/2018   TSH 2.66 09/09/2017   TSH 1.83 03/15/2017   TSH 1.40 09/03/2016   TSH 2.58 03/12/2016   TSH 2.70 09/09/2015   TSH 2.82 07/05/2015   TSH 11.38 (H) 05/26/2015   TSH 0.06 (L) 04/25/2015   TSH 0.012 (L) 02/25/2015   She is on Levothyroxine 112 mcg daily: - in am (usually when wakes up in the middle of the night to go to the restroom) - fasting - at least 30 min from b'fast - + calcium in the evening - no iron - no multivitamins now, was on Prenatals - constipation - no PPIs (has Protonix on hand) - not on Biotin  + FH of thyroid ds. In [place], [place] aunt, brother.  She has pernicious anemia - on B12 inj. She is on Entocort for lymphocytic colitis. She has hot flushes  - estrogen gel not covered. She allergic to the patch.  ROS:  + see HPI  Past Medical History:  Diagnosis Date   Anxiety    Back pain    Bursitis    right shoulder   Chronic kidney disease    hematuria, has been worked up, her normal   Chronic leukopenia    intermittant since 2010, followed by Dr. Lenord Fellers now   Chronic neck pain    Constipation    Degenerative joint disease of low back 09/02/2015   Dr. Despina Hick   Displacement of lumbar intervertebral disc    Dysrhythmia    Tachycardia, PVCs, PACs   Edema of both lower legs    Fibrocystic breast    Gallbladder problem    GERD (gastroesophageal reflux disease)    Headache    per pt more stress/tension   Heart palpitations    SEE EPIC ENCOUTNER , CARDIOLOGY DR. Gloris Manchester  TURNER 2018; reports on 05-16-17 " i haven't had any bouts of those lately"     Hemorrhoids    Hidradenitis suppurativa    History of Clostridium difficile infection 12/2010   History of exercise intolerance    ETT on 04-27-2016-- negative ([facility] treadmill score 7)   History of Helicobacter pylori infection 2001 and 09/ 2012   HSV-2 infection  genital   Hx of adenomatous colonic polyps 10/17/2007   Hydradenitis    per pt currently treated with Humira injection   Hypertension    Hyperthyroidism    Hypocupremia    Hypothyroidism    Hypothyroidism, postsurgical 1980   IBS (irritable bowel syndrome)    Insomnia    Iron deficiency    Joint pain    Leukopenia    Lumbosacral radiculopathy    Lymphocytic colitis 05/14/2022   dx from colonoscopy   Medial meniscus tear    right knee   Mild obstructive sleep apnea    Mitral regurgitation    Mild to moderate by echo 09/2021   Numbness and tingling of right arm    Obesity    Osteoarthritis    Palpitations    Pernicious anemia    b12 def   Peroneal neuropathy    PONV (postoperative nausea and vomiting)    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   Prediabetes    Reactive hypoglycemia followed by pcp   post gastrectomy dumping syndrome   SOB (shortness of breath)    Spinal stenosis    Tendonitis    right shoulder   Varicose veins    Vertigo    Vitamin B 12 deficiency    Vitamin D deficiency    Past Surgical History:  Procedure Laterality Date   BREATH TEK H PYLORI N/A 05/03/2014   Procedure: BREATH TEK H PYLORI;  Surgeon: Wenda Low, MD;  Location: [facility];  Service: General;  Laterality: N/A;   CARDIOVASCULAR STRESS TEST  03/11/2013   dr Gloris Manchester turner   Low risk nuclear study w/ a very small anterior perfusion defect that most likely represents breast attenuation artifact, less likely this could represent a true perfusion abnormality in the distribution of diagonal branch of the LAD/  normal LV function and wall motion  , ef 66%   CATARACT EXTRACTION W/ INTRAOCULAR LENS  IMPLANT, BILATERAL  10/2017   CESAREAN SECTION  1985   CHOLECYSTECTOMY OPEN  1987   COLONOSCOPY  02/2017   Dr Loreta Ave ; per patient they found 7 polyps with polypectomy; on 5 year track    ESOPHAGOGASTRODUODENOSCOPY  02/01/2021   FINE NEEDLE ASPIRATION Left 05/22/2017   Procedure: LEFT KNEE ASPIRATION;  Surgeon: Ollen Gross, MD;  Location: [facility];  Service: Orthopedics;  Laterality: Left;   GASTRIC ROUX-EN-Y N/A 07/06/2014   Procedure: LAPAROSCOPIC ROUX-EN-Y GASTRIC BYPASS, ENTEROLYSIS OF ADHESIONS WITH UPPER ENDOSCOPY;  Surgeon: Luretha Murphy, MD;  Location: [facility];  Service: General;  Laterality: N/A;   HAMMER TOE SURGERY Bilateral 02/17/2008   bilateral foot digit's 2 and 5   HIATAL HERNIA REPAIR N/A 07/06/2014   Procedure: LAPAROSCOPIC REPAIR OF HIATAL HERNIA;  Surgeon: Luretha Murphy, MD;  Location: [facility];  Service: General;  Laterality: N/A;   I & D KNEE WITH POLY EXCHANGE Left 12/11/2019   Procedure: LEFT KNEE POLY-LINER EXCHANGE;  Surgeon: Kathryne Hitch, MD;  Location: [facility];  Service: Orthopedics;  Laterality: Left;   KNEE ARTHROSCOPY Right 12/11/2019   Procedure: RIGHT KNEE ARTHROSCOPY WITH PARTIAL MEDIAL MENISCECTOMY;  Surgeon: Kathryne Hitch, MD;  Location: [facility];  Service: Orthopedics;  Laterality: Right;   KNEE ARTHROSCOPY WITH MEDIAL MENISECTOMY Left 07/17/2017   Procedure: LEFT KNEE ARTHROSCOPY WITH MEDIAL LATERAL MENISECTOMY;  Surgeon: Ollen Gross, MD;  Location: [facility];  Service: Orthopedics;  Laterality: Left;   MASS EXCISION N/A 10/11/2016   Procedure: EXCISION OF PERIANAL MASS;  Surgeon: Romie Levee, MD;  Location: [facility];  Service: General;  Laterality: N/A;   PARTIAL HYSTERECTOMY  2006   has ovaries   REFRACTIVE SURGERY     THYROIDECTOMY  1980   TOTAL ABDOMINAL HYSTERECTOMY  08-25-2002   dr Myrlene Broker   ovaries retained   TOTAL HIP ARTHROPLASTY Right 05/21/2012    Procedure: TOTAL HIP ARTHROPLASTY;  Surgeon: Loanne Drilling, MD;  Location: [facility];  Service: Orthopedics;  Laterality: Right;   TOTAL HIP ARTHROPLASTY Left 01-31-2009  dr Lequita Halt   TOTAL HIP REVISION Left 05/22/2017   Procedure: Left hip bearing surface and head revision;  Surgeon: Ollen Gross, MD;  Location: [facility];  Service: Orthopedics;  Laterality: Left;   TOTAL KNEE ARTHROPLASTY Left 01/20/2018   Procedure: LEFT TOTAL KNEE ARTHROPLASTY;  Surgeon: Ollen Gross, MD;  Location: [facility];  Service: Orthopedics;  Laterality: Left;    TOTAL KNEE ARTHROPLASTY Right 07/13/2022   Procedure: RIGHTTOTAL KNEE ARTHROPLASTY;  Surgeon: Kathryne Hitch, MD;  Location: [facility];  Service: Orthopedics;  Laterality: Right;   TRANSTHORACIC ECHOCARDIOGRAM  05/03/2016   ef 55-60%/  trivial MR/  mild TR   TUBAL LIGATION     Social History   Socioeconomic History   Marital status: Married    Spouse name: Thereasa Distance   Number of children: 2   Years of education: Not on file   Highest education level: Not on file  Occupational History   Occupation: Armed forces operational officer GI    Employer: [employer]  Tobacco Use   Smoking status: Never   Smokeless tobacco: Never  Vaping Use   Vaping status: Never Used  Substance and Sexual Activity   Alcohol use: No   Drug use: No   Sexual activity: Not Currently    Partners: Male    Birth control/protection: Surgical    Comment: hysterectomy  Other Topics Concern   Not on file  Social History Narrative   Lives at home with spouse   Right handed   Caffeine: diet zero mtn dew, occasionally    Social Drivers of Health   Financial Resource Strain: Not on file  Food Insecurity: No Food Insecurity (07/13/2022)   Hunger Vital Sign    Worried About Running Out of Food in the Last Year: Never true    Ran Out of Food in the Last Year: Never true  Transportation Needs: No Transportation Needs (07/13/2022)   PRAPARE - Administrator, Civil Service  (Medical): No    Lack of Transportation (Non-Medical): No  Physical Activity: Not on file  Stress: Not on file  Social Connections: Not on file  Intimate Partner Violence: Not At Risk (07/13/2022)   Humiliation, Afraid, Rape, and Kick questionnaire    Fear of Current or Ex-Partner: No    Emotionally Abused: No    Physically Abused: No    Sexually Abused: No   Current Outpatient Medications on File Prior to Visit  Medication Sig Dispense Refill   adalimumab (HUMIRA, 2 PEN,) 40 MG/0.8ML AJKT pen Inject 1 pen subcutaneously every week 4 each 5   albuterol (VENTOLIN HFA) 108 (90 Base) MCG/ACT inhaler Inhale 2 puffs into the lungs every 4 hours as needed for wheezing or shortness of breath. 18 g 3   amoxicillin (AMOXIL) 500 MG capsule Take 2 capsules by mouth 1 hours prior to dental appointment then 2 capsules 6 hours after as directed 8 capsule 0   budesonide (ENTOCORT EC) 3 MG 24 hr capsule Take 3 capsules (  9 mg total) by mouth in the morning. 270 capsule 3   buPROPion (WELLBUTRIN XL) 150 MG 24 hr tablet Take 1 tablet (150 mg total) by mouth daily. 90 tablet 0   Carbinoxamine Maleate 4 MG TABS Take 1-2 tablets at night as needed for drainage and allergies. May cause drowsiness. 28 tablet 5   cyanocobalamin (DODEX) 1000 MCG/ML injection INJECT 1 ML INTRAMUSCULARLY EVERY MONTH 3 mL 11   doxepin (SINEQUAN) 50 MG capsule Take 1 capsule (50 mg total) by mouth at bedtime. 90 capsule 0   ergocalciferol (DRISDOL) 1.25 MG (50000 UT) capsule Take 1 capsule (50,000 Units total) by mouth once a week. 12 capsule 1   escitalopram (LEXAPRO) 10 MG tablet Take 1 tablet (10 mg total) by mouth daily. 90 tablet 1   Estradiol 0.75 MG/1.25 GM (0.06%) topical gel Place 1 pump (1.25 g) onto the skin every other day. 50 g 0   flecainide (TAMBOCOR) 50 MG tablet Take 1 tablet (50 mg total) by mouth daily at 6 (six) AM. 90 tablet 3   gabapentin (NEURONTIN) 600 MG tablet Take 1 tablet (600 mg total) by mouth 3 (three)  times daily. 90 tablet 5   Galcanezumab-gnlm (EMGALITY) 120 MG/ML SOAJ Inject 120 mg into the skin every 30 (thirty) days. 1.12 mL 11   HYDROcodone-acetaminophen (NORCO/VICODIN) 5-325 MG tablet Take 1 tablet by mouth every 6 (six) hours as needed for severe pain. 12 tablet 0   ipratropium (ATROVENT) 0.03 % nasal spray Use 1-2 sprays in each nostril up to 3 times daily as needed for runny nose. Can take 15 minutes prior to meals. 30 mL 3   levothyroxine (SYNTHROID) 112 MCG tablet Take 1 tablet (112 mcg total) by mouth daily. 90 tablet 1   LORazepam (ATIVAN) 1 MG tablet Take 1 tablet (1 mg total) by mouth 2 (two) times daily as needed for anxiety. 60 tablet 1   metoprolol succinate (TOPROL-XL) 25 MG 24 hr tablet Take 1 tablet (25 mg total) by mouth daily. 90 tablet 3   metroNIDAZOLE (METROGEL) 0.75 % vaginal gel Place 1 Applicatorful vaginally at bedtime 5 nights in a row. 70 g 0   Olopatadine HCl 0.2 % SOLN Apply 1 drop to eye daily as needed. (Patient taking differently: Apply 1 drop to eye daily as needed (allergies).) 2.5 mL 5   ondansetron (ZOFRAN-ODT) 4 MG disintegrating tablet Take 1 tablet (4 mg total) by mouth every 8 (eight) hours as needed for nausea or vomiting. 20 tablet 0   promethazine (PHENERGAN) 25 MG tablet Take 25 mg by mouth every 6 (six) hours as needed for nausea or vomiting.     promethazine (PHENERGAN) 25 MG tablet Take 1 tablet (25 mg total) by mouth every 8 (eight) hours as needed for nausea and/or vomiting 30 tablet 5   tiZANidine (ZANAFLEX) 4 MG tablet Take 1 capsule (4 mg total) by mouth 3 (three) times daily as needed for muscle spasms 90 tablet 1   triamcinolone cream (KENALOG) 0.1 % Apply 1 Application topically 2 (two) times daily. Use for 2 weeks, and then off for 2 weeks as needed. 80 g 1   Ubrogepant (UBRELVY) 100 MG TABS Take 1 tablet (100 mg total) by mouth every 2 (two) hours as needed. Maximum 200mg  a day. 16 tablet 11   Current Facility-Administered Medications  on File Prior to Visit  Medication Dose Route Frequency Provider Last Rate Last Admin   gadobenate dimeglumine (MULTIHANCE) injection 20 mL  20 mL Intravenous  Once PRN Anson Fret, MD       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   Estradiol Rash and Other (See Comments)    Local rash with estradiol patches   Family History  Problem Relation Age of Onset   High blood pressure Mother    Hypertension Mother    Headache Mother    High Cholesterol Mother    Thyroid disease Mother    Obesity Mother    Kidney disease Mother    Hypertension Father    Diabetes Father    High blood pressure Father    High Cholesterol Father    Heart disease Father    Obesity Father    Diabetes Sister    Hypertension Sister    Thyroid disease Brother    Thyroid disease Maternal Aunt    Migraines Neg Hx    PE: BP 122/70   Pulse 65   Ht 5' 5.5" (1.664 m)   Wt 294 lb 12.8 oz (133.7 kg)   SpO2 98%   BMI 48.31 kg/m  Wt Readings from Last 10 Encounters:  03/18/23 294 lb 12.8 oz (133.7 kg)  02/12/23 297 lb (134.7 kg)  12/24/22 295 lb (133.8 kg)  12/24/22 295 lb (133.8 kg)  12/04/22 280 lb (127 kg)  11/05/22 285 lb 14.4 oz (129.7 kg)  10/17/22 273 lb (123.8 kg)  10/11/22 270 lb (122.5 kg)  10/09/22 270 lb (122.5 kg)  09/13/22 270 lb (122.5 kg)   Constitutional: overweight, in NAD Eyes: EOMI, no exophthalmos ENT: no thyromegaly, no cervical lymphadenopathy Cardiovascular: RRR, No MRG Respiratory: CTA B Musculoskeletal: no deformities Skin: warm, no rashes Neurological: no tremor with outstretched hands  Assessment: 1. Hyperparathyroidism  2.  Vitamin D deficiency  3.  Reactive hypoglycemia  4.  Postsurgical hypothyroidism  Plan: 1.  Secondary hyperparathyroidism and vitamin D deficiency  -Patient had low vitamin D and calcium along with elevated PTH as a consequence of malabsorption in the setting  of gastric bypass -We discussed that this qualifies as secondary, rather than primary hyperparathyroidism. -She is currently on ergocalciferol 50,000 units once a week (decreased from 2x a week by PCP) and liquid calcium 1 tablespoon containing 500 mg of calcium daily.  She previously had constipation with higher doses.  We discussed about possibly using MiraLAX and titrating to effect -Latest vitamin D level was normal: Lab Results  Component Value Date   VD25OH 50.1 02/20/2022  -We will repeat this today -Latest calcium level was normal, at 9.0 in 09/2022.  We will not repeat this today.  However, I did encourage her to try to take calcium daily, as she is forgetting many doses now.  2. Reactive hypoglycemia -Developed after gastric bypass -Previously hypoglycemia as low as 35, but not recently; lowest blood sugars recently were in the 50s. -She has a history of prediabetes, with the latest HbA1c being 6.1% 3.5 months ago. -At previous visits, we discussed that malabsorptive gastric bypass surgery carries the risk of hypoglycemia after meals and this is usually treated with dietary interventions.  We previously discussed about the following directives to avoid postprandial hypoglycemia: Eat lower glycemic index/load foods-gave her references Eat solids rather than liquids or semiliquid meals Have 3 meals a day +/- snacks as needed Eat as much fruit and veggies as you can.  Berries, pears, apples, peaches, apricots - are the best Do not drink liquids with a  meal, separating them by at least 30 minutes Starts the meal with protein and fat and end with carbs Not drink any sweet drinks Carry a snack with you everywhere.  Best to contain ~ 15 g of carbs. -I also previously recommended to see a nutritionist and to be given a meal plan that would work for her.  She was previously given a diet by the weight management clinic but she was not able to eat the amount recommended.  We also discussed  about GLP-1 receptor agonist at last visit.  We discussed that these are not completely contraindicated but if she develops GI symptoms or low blood sugars, she will need to stop.  However, she is not on this class of medication as of now.  She gained 24 pounds since last visit.  We again discussed that the GLP-1 receptor agonist are an option for this, but she also needs to pay attention to her diet, now after the holidays.  3. Postsurgical hypothyroidism - latest thyroid labs reviewed with pt. >> normal: Lab Results  Component Value Date   TSH 1.68 12/20/2022  - she continues on LT4 112 mcg daily - pt feels good on this dose. - we discussed about taking the thyroid hormone every day, with water, >30 minutes before breakfast, separated by >4 hours from acid reflux medications, calcium, iron, multivitamins. Pt. is taking it correctly.  Component     Latest Ref Rng 03/18/2023  Vitamin D, 25-Hydroxy     30 - 100 ng/mL 43   Normal vit. Rebbeca Paul, MD PhD [facility]

## 2023-03-18 NOTE — Patient Instructions (Addendum)
Please continue Levothyroxine 112 mcg daily:  Take the thyroid hormone every day, with water, at least 30 minutes before breakfast, separated by at least 4 hours from: - acid reflux medications - calcium - iron - multivitamins  Take 1 tbspoon calcium citrate a day.  Continue high dose vitamin D once a week.  Please stop at the lab.  Please return in 6-12 months.

## 2023-03-19 ENCOUNTER — Encounter: Payer: Self-pay | Admitting: Internal Medicine

## 2023-03-19 LAB — VITAMIN D 25 HYDROXY (VIT D DEFICIENCY, FRACTURES): Vit D, 25-Hydroxy: 43 ng/mL (ref 30–100)

## 2023-03-25 NOTE — Progress Notes (Incomplete)
Patient Care Team: Margaree Mackintosh, MD as PCP - General Quintella Reichert, MD as PCP - Cardiology (Cardiology) Linna Darner, RD as Dietitian (Family Medicine)  Visit Date: 03/26/23  Subjective:   Chief Complaint  Patient presents with   Medical Management of Chronic Issues   Fatigue   Patient WJ:XBJYNW Jolanta, Cabeza DOB:Jun 02, 1960,62 y.o. GNF:[id number]   63 y.o. Female presents today for  follow up. Patient has a past medical history of Insomnia, sleep issues, anxiety and depression. Started on 150 mg Wellbutrin-XL  12/24/22, she expresses uncertainty  as to whether or not it is actually effective as she won't leave her bedroom some days.Endorses fatigue and lethargy despite more free time to rest and relax now that she is retired.  She says she isn't used to not working and isn't sure what to do to occupy her time. Although she is  feeling slightly more rested, she is  also is dealing with situational stress trying to get her Humira covered by insurance. Has been looking at treadmills for exercise options for inclement weather/temperature options when  she can't go to [facility] . Expressed disinterest in going to counseling. Says her husband has noticed she is sleep talking.  Past Medical History:  Diagnosis Date   Anxiety    Back pain    Bursitis    right shoulder   Chronic kidney disease    hematuria, has been worked up, her normal   Chronic leukopenia    intermittant since 2010, followed by Dr. Lenord Fellers now   Chronic neck pain    Constipation    Degenerative joint disease of low back 09/02/2015   Dr. Despina Hick   Displacement of lumbar intervertebral disc    Dysrhythmia    Tachycardia, PVCs, PACs   Edema of both lower legs    Fibrocystic breast    Gallbladder problem    GERD (gastroesophageal reflux disease)    Headache    per pt more stress/tension   Heart palpitations    SEE EPIC ENCOUTNER , CARDIOLOGY DR. Gloris Manchester TURNER 2018; reports on 05-16-17 " i haven't had any  bouts of those lately"     Hemorrhoids    Hidradenitis suppurativa    History of Clostridium difficile infection 12/2010   History of exercise intolerance    ETT on 04-27-2016-- negative ([facility] treadmill score 7)   History of Helicobacter pylori infection 2001 and 09/ 2012   HSV-2 infection    genital   Hx of adenomatous colonic polyps 10/17/2007   Hydradenitis    per pt currently treated with Humira injection   Hypertension    Hyperthyroidism    Hypocupremia    Hypothyroidism    Hypothyroidism, postsurgical 1980   IBS (irritable bowel syndrome)    Insomnia    Iron deficiency    Joint pain    Leukopenia    Lumbosacral radiculopathy    Lymphocytic colitis 05/14/2022   dx from colonoscopy   Medial meniscus tear    right knee   Mild obstructive sleep apnea    Mitral regurgitation    Mild to moderate by echo 09/2021   Numbness and tingling of right arm    Obesity    Osteoarthritis    Palpitations    Pernicious anemia    b12 def   Peroneal neuropathy    PONV (postoperative nausea and vomiting)    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   Prediabetes    Reactive hypoglycemia followed by pcp  post gastrectomy dumping syndrome   SOB (shortness of breath)    Spinal stenosis    Tendonitis    right shoulder   Varicose veins    Vertigo    Vitamin B 12 deficiency    Vitamin D deficiency     Family History  Problem Relation Age of Onset   High blood pressure Mother    Hypertension Mother    Headache Mother    High Cholesterol Mother    Thyroid disease Mother    Obesity Mother    Kidney disease Mother    Hypertension Father    Diabetes Father    High blood pressure Father    High Cholesterol Father    Heart disease Father    Obesity Father    Diabetes Sister    Hypertension Sister    Thyroid disease Brother    Thyroid disease Maternal Aunt    Migraines Neg Hx    Social History   Social History Narrative   Lives at home with spouse   Right handed    Caffeine: diet zero mtn dew, occasionally  Retired from [employer] 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 Negative for fever.  HENT:  Negative for congestion.   Eyes:  Negative for blurred vision.  Respiratory:  Negative for cough and shortness of breath.   Cardiovascular:  Negative for chest pain, palpitations and leg swelling.  Gastrointestinal:  Negative for vomiting.  Musculoskeletal:  Negative for back pain.  Skin:  Negative for rash.  Neurological:  Negative for loss of consciousness and headaches.       (+) Sleep Talking   Psychiatric/Behavioral:  Negative for suicidal ideas.        (+) Feeling worn out and lost now that she is retired as she isn't used to not working.  (+) Situationally stressed due to trying to get Humira covered.       Objective:  Vitals: BP 100/80   Pulse 92   Ht 5' 5.5" (1.664 m)   Wt 291 lb (132 kg)   SpO2 96%   BMI 47.69 kg/m   Physical Exam Vitals and nursing note reviewed.  Constitutional:      General: She is not in acute distress.    Appearance: Normal appearance. She is not toxic-appearing.  HENT:     Head: Normocephalic and atraumatic.  Pulmonary:     Effort: Pulmonary effort is normal.  Skin:    General: Skin is warm and dry.  Neurological:     Mental Status: She is alert and oriented to person, place, and time. Mental status is at baseline.  Psychiatric:        Mood and Affect: Mood normal.        Behavior: Behavior normal.        Thought Content: Thought content normal.        Judgment: Judgment normal.     Results:  Studies Obtained And Personally Reviewed By Me: Labs:     Component Value Date/Time   NA 140 10/09/2022 0436   NA 144 09/03/2022 1425   K 4.1 10/09/2022 0436   CL 104 10/09/2022 0436   CO2 26 10/09/2022 0436   GLUCOSE 97 10/09/2022 0436   BUN 15 10/09/2022 0436   BUN 9 09/03/2022 1425   CREATININE 1.43 (H) 10/09/2022 0436    CREATININE 1.23 (H) 07/09/2022 1132   CALCIUM 9.0 10/09/2022 0436   PROT  7.0 12/20/2022 0925   PROT 7.8 09/03/2022 1425   ALBUMIN 3.7 10/09/2022 0436   ALBUMIN 4.2 09/03/2022 1425   AST 15 12/20/2022 0925   AST 26 10/16/2018 0903   ALT 16 12/20/2022 0925   ALT 26 10/16/2018 0903   ALKPHOS 61 10/09/2022 0436   BILITOT 0.4 12/20/2022 0925   BILITOT 0.2 09/03/2022 1425   BILITOT 0.5 10/16/2018 0903   GFRNONAA 42 (L) 10/09/2022 0436   GFRNONAA 93 06/30/2020 1105   GFRAA 108 06/30/2020 1105    Lab Results  Component Value Date   WBC 4.7 10/09/2022   HGB 12.0 10/09/2022   HCT 37.7 10/09/2022   MCV 97.9 10/09/2022   PLT 204 10/09/2022   Lab Results  Component Value Date   CHOL 203 (H) 12/20/2022   HDL 83 12/20/2022   LDLCALC 106 (H) 12/20/2022   TRIG 62 12/20/2022   CHOLHDL 2.4 12/20/2022   Lab Results  Component Value Date   HGBA1C 6.1 (H) 12/20/2022    Lab Results  Component Value Date   TSH 1.68 12/20/2022   Assessment & Plan:   Fatigue: has improved marginally. Is currently dealing with some situational stress with getting Humira. Continue 150 mg Wellbutrin-XL daily - refilled today. Informed patient it may take several months for her fatigue to fully resolve but to let us know if symptoms change/worsen and to contact emergency services if she starts having any suicidal ideations.  Consider counseling in the near future if not feeling rested and more motivated soon.  I,Emily Lagle,acting as a Neurosurgeon for Margaree Mackintosh, MD.,have documented all relevant documentation on the behalf of Margaree Mackintosh, MD,as directed by  Margaree Mackintosh, MD while in the presence of Margaree Mackintosh, MD.   I, Margaree Mackintosh, MD, have reviewed all documentation for this visit. The documentation on 03/26/23 for the exam, diagnosis, procedures, and orders are all accurate and complete.

## 2023-03-26 ENCOUNTER — Encounter: Payer: Self-pay | Admitting: Internal Medicine

## 2023-03-26 ENCOUNTER — Ambulatory Visit: Payer: 59 | Admitting: Internal Medicine

## 2023-03-26 VITALS — BP 100/80 | HR 92 | Ht 65.5 in | Wt 291.0 lb

## 2023-03-26 DIAGNOSIS — G4709 Other insomnia: Secondary | ICD-10-CM

## 2023-03-26 DIAGNOSIS — Z8669 Personal history of other diseases of the nervous system and sense organs: Secondary | ICD-10-CM

## 2023-03-26 DIAGNOSIS — G4733 Obstructive sleep apnea (adult) (pediatric): Secondary | ICD-10-CM | POA: Diagnosis not present

## 2023-03-26 DIAGNOSIS — R5383 Other fatigue: Secondary | ICD-10-CM

## 2023-03-26 DIAGNOSIS — Z96643 Presence of artificial hip joint, bilateral: Secondary | ICD-10-CM

## 2023-03-26 DIAGNOSIS — E890 Postprocedural hypothyroidism: Secondary | ICD-10-CM

## 2023-03-26 DIAGNOSIS — E538 Deficiency of other specified B group vitamins: Secondary | ICD-10-CM

## 2023-03-26 MED ORDER — BUPROPION HCL ER (XL) 150 MG PO TB24: 150.0000 mg | ORAL_TABLET | Freq: Every day | ORAL | 0 refills | Status: DC

## 2023-03-26 NOTE — Patient Instructions (Addendum)
We hope you will feel better soon. It is not unusual to feel a bit lost with retiring. Now you have time to focus on your health and well-being and to work part time. Try to eat healthy, get some exercise and take it easy. TSH, Vitamin D and Hgb AIC are stable. Health maintenance exam is due in May.

## 2023-03-29 ENCOUNTER — Encounter: Payer: Self-pay | Admitting: Obstetrics and Gynecology

## 2023-04-01 ENCOUNTER — Other Ambulatory Visit: Payer: Self-pay | Admitting: Obstetrics and Gynecology

## 2023-04-01 DIAGNOSIS — N951 Menopausal and female climacteric states: Secondary | ICD-10-CM

## 2023-04-01 MED ORDER — ESTRADIOL 0.75 MG/1.25 GM (0.06%) TD GEL: 1.2500 g | Freq: Every day | TRANSDERMAL | 3 refills | Status: DC

## 2023-04-01 NOTE — Telephone Encounter (Signed)
AEX 12/17/2 [facility] 05/29/22 -BiRads 1 neg Pharmacy updated.  Routing to Dr. Edward Jolly to review and advise.

## 2023-04-01 NOTE — Progress Notes (Signed)
Refills for estradiol gel.

## 2023-04-20 ENCOUNTER — Other Ambulatory Visit: Payer: Self-pay | Admitting: Internal Medicine

## 2023-05-11 ENCOUNTER — Other Ambulatory Visit: Payer: Self-pay | Admitting: Internal Medicine

## 2023-05-13 ENCOUNTER — Other Ambulatory Visit: Payer: Self-pay

## 2023-05-13 ENCOUNTER — Other Ambulatory Visit: Payer: Self-pay | Admitting: Internal Medicine

## 2023-05-14 ENCOUNTER — Other Ambulatory Visit: Payer: Self-pay

## 2023-05-20 ENCOUNTER — Other Ambulatory Visit: Payer: Self-pay | Admitting: Internal Medicine

## 2023-05-20 MED ORDER — PROMETHAZINE HCL 25 MG PO TABS: 25.0000 mg | ORAL_TABLET | Freq: Three times a day (TID) | ORAL | 5 refills | Status: DC | PRN

## 2023-05-20 NOTE — Telephone Encounter (Signed)
 Copied from CRM [id number]. Topic: Clinical - Medication Refill >> May 20, 2023 12:53 PM Carlatta H wrote: Most Recent Primary Care Visit:  Provider: Margaree Mackintosh  Department: [facility]  Visit Type: OFFICE VISIT  Date: 03/26/2023  Medication: promethazine (PHENERGAN) 25 MG tablet [[id number]]  Has the patient contacted their pharmacy? Yes (Agent: If no, request that the patient contact the pharmacy for the refill. If patient does not wish to contact the pharmacy document the reason why and proceed with request.) (Agent: If yes, when and what did the pharmacy advise?) Advised to call office  Is this the correct pharmacy for this prescription? Yes If no, delete pharmacy and type the correct one.  This is the patient's preferred pharmacy:  [facility] [phone] [place], [place] - [address] [place] [address] Phone: [phone] Fax: [phone]   Has the prescription been filled recently? No  Is the patient out of the medication? Yes  Has the patient been seen for an appointment in the last year OR does the patient have an upcoming appointment? Yes  Can we respond through MyChart? No  Agent: Please be advised that Rx refills may take up to 3 business days. We ask that you follow-up with your pharmacy.

## 2023-05-29 ENCOUNTER — Other Ambulatory Visit: Payer: Self-pay | Admitting: Internal Medicine

## 2023-06-10 NOTE — Progress Notes (Unsigned)
 Date:  06/11/2023   ID:  Victoria [name], DOB 1960/04/27, MRN [id number] The patient was identified using 2 identifiers.  PCP:  Margaree Mackintosh, MD   [facility] Providers Cardiologist:  Armanda Magic, MD     Evaluation Performed:  Follow-Up Visit  Chief Complaint:  Palpitations   History of Present Illness:    [name] is a 63 y.o. female with a history of GERD, hiatal hernia and chest pain.  She was initially see by me several years ago for chest pain and nuclear stress test showed low risk study with a very small anterior perfusion defect that most likely represented breast attenuation artifact. Less likely this could represent a true perfusion abnormality in the distribution of a diagonal artery branch of the LAD.  Her EF was normal.  She was started on Protonix for possible GERD symptoms and CP resolved.   She had a gastric bypass in 2016 and had not felt well since then.  Her DOE was with talking and walking and also was having some epigastric pain at night.  2D echo was normal which was normal. She had an ETT done which showed no ischemia.     Repeat 2D echo 3/22 was normal and coronary CTA showed no CAD and Ca score of 0 on 2/22.  She wore an event monitor which showed PVCs, PACs, nonsustained atrial tachycardia, NSVT and she was placed on Toprol XL 25mg  daily and ultimately flecainide 50 mg twice daily was added as well for ongoing palpitations.    She then complained at one of her OV with me of excessive daytime and a Sleep Study showed mild OSA with AHI 11/hr and O2 sats as low as 60% and is now on CPAP and followup by Pulmonary.   She is here today for followup.  Recently she has been feeling poorly. She had a knee replacement last year and since then she gained around 40lbs. She has been dx with lymphocytic colitis and is on immunosuppressants. Since the weight gain the SOB has worsened and in the last month her neck and jaw have been aching.   The aching is only at rest when she is laying down.  She has not noticed any exertional CP, jaw pain or arm pain.  She gets DOE with minimal activity even walking down the hall.  She has been walking on a treadmill for up to a mile for an hour 3 times weekly and gets SOB with that as well but no CP or neck pain. She has chronic LE edema that she thinks is stable.  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 She denies any PND, orthopnea or syncope. She has noticed her heart racing when she is laying down and is intermittent and not constaant. She is compliant with her meds and is tolerating meds with no SE.    Past Medical History:  Diagnosis Date   Anxiety    Back pain    Bursitis    right shoulder   Chronic kidney disease    hematuria, has been worked up, her normal   Chronic leukopenia    intermittant since 2010, followed by Dr. Lenord Fellers now   Chronic neck pain    Constipation    Degenerative joint disease of low back 09/02/2015   Dr. Despina Hick   Displacement  of lumbar intervertebral disc    Dysrhythmia    Tachycardia, PVCs, PACs   Edema of both lower legs    Fibrocystic breast    Gallbladder problem    GERD (gastroesophageal reflux disease)    Headache    per pt more stress/tension   Heart palpitations    SEE EPIC ENCOUTNER , CARDIOLOGY DR. Gloris Manchester Melvenia Favela 2018; reports on 05-16-17 " i haven't had any bouts of those lately"     Hemorrhoids    Hidradenitis suppurativa    History of Clostridium difficile infection 12/2010   History of exercise intolerance    ETT on 04-27-2016-- negative ([facility] treadmill score 7)   History of Helicobacter pylori infection 2001 and 09/ 2012   HSV-2 infection    genital   Hx of adenomatous colonic polyps 10/17/2007   Hydradenitis    per pt currently treated with Humira injection   Hypertension    Hyperthyroidism    Hypocupremia    Hypothyroidism     Hypothyroidism, postsurgical 1980   IBS (irritable bowel syndrome)    Insomnia    Iron deficiency    Joint pain    Leukopenia    Lumbosacral radiculopathy    Lymphocytic colitis 05/14/2022   dx from colonoscopy   Medial meniscus tear    right knee   Mild obstructive sleep apnea    Mitral regurgitation    Mild to moderate by echo 09/2021   Numbness and tingling of right arm    Obesity    Osteoarthritis    Palpitations    Pernicious anemia    b12 def   Peroneal neuropathy    PONV (postoperative nausea and vomiting)    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   Prediabetes    Reactive hypoglycemia followed by pcp   post gastrectomy dumping syndrome   SOB (shortness of breath)    Spinal stenosis    Tendonitis    right shoulder   Varicose veins    Vertigo    Vitamin B 12 deficiency    Vitamin D deficiency    Past Surgical History:  Procedure Laterality Date   BREATH TEK H PYLORI N/A 05/03/2014   Procedure: BREATH TEK H PYLORI;  Surgeon: Wenda Low, MD;  Location: [facility];  Service: General;  Laterality: N/A;   CARDIOVASCULAR STRESS TEST  03/11/2013   dr Gloris Manchester Hakop Humbarger   Low risk nuclear study w/ a very small anterior perfusion defect that most likely represents breast attenuation artifact, less likely this could represent a true perfusion abnormality in the distribution of diagonal branch of the LAD/  normal LV function and wall motion , ef 66%   CATARACT EXTRACTION W/ INTRAOCULAR LENS  IMPLANT, BILATERAL  10/2017   CESAREAN SECTION  1985   CHOLECYSTECTOMY OPEN  1987   COLONOSCOPY  02/2017   Dr Loreta Ave ; per patient they found 7 polyps with polypectomy; on 5 year track    ESOPHAGOGASTRODUODENOSCOPY  02/01/2021   FINE NEEDLE ASPIRATION Left 05/22/2017   Procedure: LEFT KNEE ASPIRATION;  Surgeon: Ollen Gross, MD;  Location: [facility];  Service: Orthopedics;  Laterality: Left;   GASTRIC ROUX-EN-Y N/A 07/06/2014   Procedure: LAPAROSCOPIC ROUX-EN-Y GASTRIC BYPASS, ENTEROLYSIS  OF ADHESIONS WITH UPPER ENDOSCOPY;  Surgeon: Luretha Murphy, MD;  Location: [facility];  Service: General;  Laterality: N/A;   HAMMER TOE SURGERY Bilateral 02/17/2008   bilateral foot digit's 2 and 5   HIATAL HERNIA REPAIR N/A 07/06/2014   Procedure: LAPAROSCOPIC REPAIR OF HIATAL HERNIA;  Surgeon: Luretha Murphy, MD;  Location: [facility];  Service: General;  Laterality: N/A;   I & D KNEE WITH POLY EXCHANGE Left 12/11/2019   Procedure: LEFT KNEE POLY-LINER EXCHANGE;  Surgeon: Kathryne Hitch, MD;  Location: [facility];  Service: Orthopedics;  Laterality: Left;   KNEE ARTHROSCOPY Right 12/11/2019   Procedure: RIGHT KNEE ARTHROSCOPY WITH PARTIAL MEDIAL MENISCECTOMY;  Surgeon: Kathryne Hitch, MD;  Location: [facility];  Service: Orthopedics;  Laterality: Right;   KNEE ARTHROSCOPY WITH MEDIAL MENISECTOMY Left 07/17/2017   Procedure: LEFT KNEE ARTHROSCOPY WITH MEDIAL LATERAL MENISECTOMY;  Surgeon: Ollen Gross, MD;  Location: [facility];  Service: Orthopedics;  Laterality: Left;   MASS EXCISION N/A 10/11/2016   Procedure: EXCISION OF PERIANAL MASS;  Surgeon: Romie Levee, MD;  Location[facility];  Service: General;  Laterality: N/A;   PARTIAL HYSTERECTOMY  2006   has ovaries   REFRACTIVE SURGERY     THYROIDECTOMY  1980   TOTAL ABDOMINAL HYSTERECTOMY  08-25-2002   dr Myrlene Broker   ovaries retained   TOTAL HIP ARTHROPLASTY Right 05/21/2012   Procedure: TOTAL HIP ARTHROPLASTY;  Surgeon: Loanne Drilling, MD;  Location: [facility];  Service: Orthopedics;  Laterality: Right;   TOTAL HIP ARTHROPLASTY Left 01-31-2009  dr Lequita Halt   TOTAL HIP REVISION Left 05/22/2017   Procedure: Left hip bearing surface and head revision;  Surgeon: Ollen Gross, MD;  Location: [facility];  Service: Orthopedics;  Laterality: Left;   TOTAL KNEE ARTHROPLASTY Left 01/20/2018   Procedure: LEFT TOTAL KNEE ARTHROPLASTY;  Surgeon: Ollen Gross, MD;  Location: [facility];  Service: Orthopedics;  Laterality: Left;     TOTAL KNEE ARTHROPLASTY Right 07/13/2022   Procedure: RIGHTTOTAL KNEE ARTHROPLASTY;  Surgeon: Kathryne Hitch, MD;  Location: [facility];  Service: Orthopedics;  Laterality: Right;   TRANSTHORACIC ECHOCARDIOGRAM  05/03/2016   ef 55-60%/  trivial MR/  mild TR   TUBAL LIGATION       Current Meds  Medication Sig   albuterol (VENTOLIN HFA) 108 (90 Base) MCG/ACT inhaler Inhale 2 puffs into the lungs every 4 hours as needed for wheezing or shortness of breath.   budesonide (ENTOCORT EC) 3 MG 24 hr capsule Take 3 capsules (9 mg total) by mouth in the morning.   buPROPion (WELLBUTRIN XL) 150 MG 24 hr tablet TAKE 1 TABLET BY MOUTH EVERY DAY   cyanocobalamin (DODEX) 1000 MCG/ML injection INJECT 1 ML INTRAMUSCULARLY EVERY MONTH   ergocalciferol (DRISDOL) 1.25 MG (50000 UT) capsule Take 1 capsule (50,000 Units total) by mouth once a week.   escitalopram (LEXAPRO) 10 MG tablet TAKE 1 TABLET BY MOUTH EVERY DAY (Patient taking differently: Take 5 mg by mouth daily.)   Estradiol 0.75 MG/1.25 GM (0.06%) topical gel Place 1 pump (1.25 g) onto the skin every other day.   flecainide (TAMBOCOR) 50 MG tablet Take 1 tablet (50 mg total) by mouth daily at 6 (six) AM.   gabapentin (NEURONTIN) 600 MG tablet Take 1 tablet (600 mg total) by mouth 3 (three) times daily.   HYDROcodone-acetaminophen (NORCO/VICODIN) 5-325 MG tablet Take 1 tablet by mouth every 6 (six) hours as needed for severe pain.   HYRIMOZ 40 MG/0.4ML SOAJ    ipratropium (ATROVENT) 0.03 % nasal spray Use 1-2 sprays in each nostril up to 3 times daily as needed for runny nose. Can take 15 minutes prior to meals.   levothyroxine (SYNTHROID) 112 MCG tablet Take 1 tablet (112 mcg total) by mouth daily.  LORazepam (ATIVAN) 1 MG tablet TAKE 1 TABLET BY MOUTH TWICE A DAY FOR ANXIETY   metoprolol succinate (TOPROL-XL) 25 MG 24 hr tablet Take 1 tablet (25 mg total) by mouth daily.   Olopatadine HCl 0.2 % SOLN Apply 1 drop to eye daily as needed. (Patient  taking differently: Apply 1 drop to eye daily as needed (allergies).)   promethazine (PHENERGAN) 25 MG tablet Take 1 tablet (25 mg total) by mouth every 8 (eight) hours as needed for nausea and/or vomiting   tiZANidine (ZANAFLEX) 4 MG tablet Take 1 tablet (4 mg total) by mouth 3 (three) times daily.   Ubrogepant (UBRELVY) 100 MG TABS Take 1 tablet (100 mg total) by mouth every 2 (two) hours as needed. Maximum 200mg  a day.     Allergies:   Other and Estradiol   Social History   Tobacco Use   Smoking status: Never   Smokeless tobacco: Never  Vaping Use   Vaping status: Never Used  Substance Use Topics   Alcohol use: No   Drug use: No     Family Hx: The patient's family history includes Diabetes in her father and sister; Headache in her mother; Heart disease in her father; High Cholesterol in her father and mother; High blood pressure in her father and mother; Hypertension in her father, mother, and sister; Kidney disease in her mother; Obesity in her father and mother; Thyroid disease in her brother, maternal aunt, and mother. There is no history of Migraines.  ROS:   Please see the history of present illness.     All other systems reviewed and are negative.   Prior Sleep studies:   The following studies were reviewed today:  none  Labs/Other Tests and Data Reviewed:     Recent Labs: 10/09/2022: BUN 15; Creatinine, Ser 1.43; Hemoglobin 12.0; Platelets 204; Potassium 4.1; Sodium 140 12/20/2022: ALT 16; TSH 1.68   Wt Readings from Last 3 Encounters:  06/11/23 296 lb (134.3 kg)  03/26/23 291 lb (132 kg)  03/18/23 294 lb 12.8 oz (133.7 kg)     Risk Assessment/Calculations:      Objective:    Vital Signs:  BP 118/88   Pulse (!) 59   Ht 5\' 7"  (1.702 m)   Wt 296 lb (134.3 kg)   SpO2 95%   BMI 46.36 kg/m   GEN: Well nourished, well developed in no acute distress HEENT: Normal NECK: No JVD; No carotid bruits LYMPHATICS: No lymphadenopathy CARDIAC:RRR, no murmurs,  rubs, gallops RESPIRATORY:  Clear to auscultation without rales, wheezing or rhonchi  ABDOMEN: Soft, non-tender, non-distended MUSCULOSKELETAL:  No edema; No deformity  SKIN: Warm and dry NEUROLOGIC:  Alert and oriented x 3 PSYCHIATRIC:  Normal affect  ASSESSMENT & PLAN:     Palpitations/Nonsustained atrial tachycardia -event monitor 04/2020 showed PVCs, PACs, nonsustained atrial tachycardia and nonsustained VT -coronary CTA showed no CAD and echo showed normal LVF -She is tolerating flecainide and Toprol well -She has been noticing her heart racing when she is still --She will continue flecainide 50 mg daily and Toprol XL 25 mg daily with as needed refills -I will get a 2 week ziopatch to assess for arrhythmias  Chest pain -Coronary CTA was completely normal in February 2022 and 2D echo was normal -Likely related to GERD as improved on Protonix>>CP has resolved -She denies any further episodes of chest pain   DOE OSA -2Decho 04/2020 showed normal LVF and no CAD -likely related to morbid obesity and sedentary state>>she has gained  over 40lbs in the past year -Now with worsening DOE that I suspect is related to weight gain -will check a Stress PET CT to rule out ischemia and assess for microvascular disease Informed Consent   Shared Decision Making/Informed Consent The risks [chest pain, shortness of breath, cardiac arrhythmias, dizziness, blood pressure fluctuations, myocardial infarction, stroke/transient ischemic attack, nausea, vomiting, allergic reaction, radiation exposure, metallic taste sensation and life-threatening complications (estimated to be 1 in 10,000)], benefits (risk stratification, diagnosing coronary artery disease, treatment guidance) and alternatives of a cardiac PET stress test were discussed in detail with Ms. Bevin Bucks and she agrees to proceed. -check TSH, ddimer to rule out PE, CBC   -check 2D echo to assess LVF -Per OSA on CPAP is followed by pulmonary   Total  time of encounter: 20 minutes total time of encounter, including 15 minutes spent in face-to-face patient care on the date of this encounter. This time includes coordination of care and counseling regarding above mentioned problem list. Remainder of non-face-to-face time involved reviewing chart documents/testing relevant to the patient encounter and documentation in the medical record. I have independently reviewed documentation from referring provider.    Medication Adjustments/Labs and Tests Ordered: Current medicines are reviewed at length with the patient today.  Concerns regarding medicines are outlined above.   Tests Ordered: No orders of the defined types were placed in this encounter.   Medication Changes: No orders of the defined types were placed in this encounter.   Follow Up:  In Person in 1 year(s)  Signed, Gaylyn Keas, MD  06/11/2023 8:19 AM    [facility]

## 2023-06-11 ENCOUNTER — Ambulatory Visit: Attending: Cardiology | Admitting: Cardiology

## 2023-06-11 ENCOUNTER — Other Ambulatory Visit: Payer: Self-pay | Admitting: Cardiology

## 2023-06-11 ENCOUNTER — Ambulatory Visit (INDEPENDENT_AMBULATORY_CARE_PROVIDER_SITE_OTHER)

## 2023-06-11 ENCOUNTER — Encounter: Payer: Self-pay | Admitting: Cardiology

## 2023-06-11 VITALS — BP 118/88 | HR 59 | Ht 67.0 in | Wt 296.0 lb

## 2023-06-11 DIAGNOSIS — R079 Chest pain, unspecified: Secondary | ICD-10-CM | POA: Diagnosis not present

## 2023-06-11 DIAGNOSIS — R0609 Other forms of dyspnea: Secondary | ICD-10-CM | POA: Diagnosis not present

## 2023-06-11 DIAGNOSIS — G4733 Obstructive sleep apnea (adult) (pediatric): Secondary | ICD-10-CM | POA: Diagnosis not present

## 2023-06-11 DIAGNOSIS — I4719 Other supraventricular tachycardia: Secondary | ICD-10-CM | POA: Diagnosis not present

## 2023-06-11 DIAGNOSIS — R002 Palpitations: Secondary | ICD-10-CM

## 2023-06-11 DIAGNOSIS — R0602 Shortness of breath: Secondary | ICD-10-CM | POA: Diagnosis not present

## 2023-06-11 LAB — CBC

## 2023-06-11 NOTE — Patient Instructions (Signed)
 Medication Instructions:  Your physician recommends that you continue on your current medications as directed. Please refer to the Current Medication list given to you today.  *If you need a refill on your cardiac medications before your next appointment, please call your pharmacy*  Lab Work: TSH, D dimer, BNP, CBC today If you have labs (blood work) drawn today and your tests are completely normal, you will receive your results only by: MyChart Message (if you have MyChart) OR A paper copy in the mail If you have any lab test that is abnormal or we need to change your treatment, we will call you to review the results.  Testing/Procedures: Echo Your physician has requested that you have an echocardiogram. Echocardiography is a painless test that uses sound waves to create images of your heart. It provides your doctor with information about the size and shape of your heart and how well your heart's chambers and valves are working. This procedure takes approximately one hour. There are no restrictions for this procedure. Please do NOT wear cologne, perfume, aftershave, or lotions (deodorant is allowed). Please arrive 15 minutes prior to your appointment time.  Please note: We ask at that you not bring children with you during ultrasound (echo/ vascular) testing. Due to room size and safety concerns, children are not allowed in the ultrasound rooms during exams. Our front office staff cannot provide observation of children in our lobby area while testing is being conducted. An adult accompanying a patient to their appointment will only be allowed in the ultrasound room at the discretion of the ultrasound technician under special circumstances. We apologize for any inconvenience.  14 Day Zio Heart Monitor Your physician has requested that you wear a Zio heart monitor for 14 days. This will be mailed to your home with instructions on how to apply the monitor and how to return it when finished. Please  allow 2 weeks after returning the heart monitor before our office calls you with the results.   PET/CT Stress Test  Follow-Up: At [facility], you and your health needs are our priority.  As part of our continuing mission to provide you with exceptional heart care, our providers are all part of one team.  This team includes your primary Cardiologist (physician) and Advanced Practice Providers or APPs (Physician Assistants and Nurse Practitioners) who all work together to provide you with the care you need, when you need it.  Your next appointment:   1 year  Provider:   Micael Adas. MD  We recommend signing up for the patient portal called "MyChart".  Sign up information is provided on this After Visit Summary.  MyChart is used to connect with patients for Virtual Visits (Telemedicine).  Patients are able to view lab/test results, encounter notes, upcoming appointments, etc.  Non-urgent messages can be sent to your provider as well.   To learn more about what you can do with MyChart, go to [email or web address].   Other Instructions ZIO XT- Long Term Monitor Instructions  Your physician has requested you wear a ZIO patch monitor for 14 days.  This is a single patch monitor. [employer] supplies one patch monitor per enrollment. Additional stickers are not available. Please do not apply patch if you will be having a Nuclear Stress Test,  Echocardiogram, Cardiac CT, MRI, or Chest Xray during the period you would be wearing the  monitor. The patch cannot be worn during these tests. You cannot remove and re-apply the  ZIO XT patch monitor.  Your ZIO  patch monitor will be mailed 3 day USPS to your address on file. It may take 3-5 days  to receive your monitor after you have been enrolled.  Once you have received your monitor, please review the enclosed instructions. Your monitor  has already been registered assigning a specific monitor serial # to you.  Billing and Patient Assistance  Program Information  We have supplied [employer] with any of your insurance information on file for billing purposes. [employer] offers a sliding scale Patient Assistance Program for patients that do not have  insurance, or whose insurance does not completely cover the cost of the ZIO monitor.  You must apply for the Patient Assistance Program to qualify for this discounted rate.  To apply, please call [employer] at [phone], select option 4, select option 2, ask to apply for  Patient Assistance Program. Sanna Crystal will ask your household income, and how many people  are in your household. They will quote your out-of-pocket cost based on that information.  [employer] will also be able to set up a 79-month, interest-free payment plan if needed.  Applying the monitor   Shave hair from upper left chest.  Hold abrader disc by orange tab. Rub abrader in 40 strokes over the upper left chest as  indicated in your monitor instructions.  Clean area with 4 enclosed alcohol pads. Let dry.  Apply patch as indicated in monitor instructions. Patch will be placed under collarbone on left  side of chest with arrow pointing upward.  Rub patch adhesive wings for 2 minutes. Remove white label marked "1". Remove the white  label marked "2". Rub patch adhesive wings for 2 additional minutes.  While looking in a mirror, press and release button in center of patch. A small green light will  flash 3-4 times. This will be your only indicator that the monitor has been turned on.  Do not shower for the first 24 hours. You may shower after the first 24 hours.  Press the button if you feel a symptom. You will hear a small click. Record Date, Time and  Symptom in the Patient Logbook.  When you are ready to remove the patch, follow instructions on the last 2 pages of Patient  Logbook. Stick patch monitor onto the last page of Patient Logbook.  Place Patient Logbook in the blue and white box. Use locking tab on box and tape box  closed  securely. The blue and white box has prepaid postage on it. Please place it in the mailbox as  soon as possible. Your physician should have your test results approximately 7 days after the  monitor has been mailed back to [facility].  Call [facility] Customer Care at [phone] if you have questions regarding  your ZIO XT patch monitor. Call them immediately if you see an orange light blinking on your  monitor.  If your monitor falls off in less than 4 days, contact our Monitor department at [phone].  If your monitor becomes loose or falls off after 4 days call [employer] at [phone] for  suggestions on securing your monitor       Please report to Radiology at the [facility] Main Entrance 30 minutes early for your test.  [address] [place], [place] [address]  How to Prepare for Your Cardiac PET/CT Stress Test:  Nothing to eat or drink, except water, 3 hours prior to arrival time.  NO caffeine/decaffeinated products, or chocolate 12 hours prior to arrival. (Please note decaffeinated beverages (teas/coffees)  still contain caffeine).  If you have caffeine within 12 hours prior, the test will need to be rescheduled.  Medication instructions: Do not take erectile dysfunction medications for 72 hours prior to test (sildenafil, tadalafil) Do not take nitrates (isosorbide mononitrate, Ranexa) the day before or day of test Do not take tamsulosin the day before or morning of test Hold theophylline containing medications for 12 hours. Hold Dipyridamole 48 hours prior to the test.  Diabetic Preparation: If able to eat breakfast prior to 3 hour fasting, you may take all medications, including your insulin. Do not worry if you miss your breakfast dose of insulin - start at your next meal. If you do not eat prior to 3 hour fast-Hold all diabetes (oral and insulin) medications. Patients who wear a continuous glucose monitor MUST remove the device prior to  scanning.  You may take your remaining medications with water.  NO perfume, cologne or lotion on chest or abdomen area. FEMALES - Please avoid wearing dresses to this appointment.  Total time is 1 to 2 hours; you may want to bring reading material for the waiting time.  IF YOU THINK YOU MAY BE PREGNANT, OR ARE NURSING PLEASE INFORM THE TECHNOLOGIST.  In preparation for your appointment, medication and supplies will be purchased.  Appointment availability is limited, so if you need to cancel or reschedule, please call the Radiology Department Scheduler at [phone] 24 hours in advance to avoid a cancellation fee of $100.00  What to Expect When you Arrive:  Once you arrive and check in for your appointment, you will be taken to a preparation room within the Radiology Department.  A technologist or Nurse will obtain your medical history, verify that you are correctly prepped for the exam, and explain the procedure.  Afterwards, an IV will be started in your arm and electrodes will be placed on your skin for EKG monitoring during the stress portion of the exam. Then you will be escorted to the PET/CT scanner.  There, staff will get you positioned on the scanner and obtain a blood pressure and EKG.  During the exam, you will continue to be connected to the EKG and blood pressure machines.  A small, safe amount of a radioactive tracer will be injected in your IV to obtain a series of pictures of your heart along with an injection of a stress agent.    After your Exam:  It is recommended that you eat a meal and drink a caffeinated beverage to counter act any effects of the stress agent.  Drink plenty of fluids for the remainder of the day and urinate frequently for the first couple of hours after the exam.  Your doctor will inform you of your test results within 7-10 business days.  For more information and frequently asked questions, please visit our  website: [email or web address]  For questions about your test or how to prepare for your test, please call: Cardiac Imaging Nurse Navigators Office: [phone]       1st Floor: - Lobby - Registration  - Pharmacy  - Lab - Cafe  2nd Floor: - PV Lab - Diagnostic Testing (echo, CT, nuclear med)  3rd Floor: - Vacant  4th Floor: - TCTS (cardiothoracic surgery) - AFib Clinic - Structural Heart Clinic - Vascular Surgery  - Vascular Ultrasound  5th Floor: - [facility] Cardiology (general and EP) - Clinical Pharmacy for coumadin, hypertension, lipid, weight-loss medications, and med management appointments    Valet parking services will be available as well.

## 2023-06-11 NOTE — Addendum Note (Signed)
 Addended by: Carey Johndrow N on: 06/11/2023 08:44 AM   Modules accepted: Orders

## 2023-06-11 NOTE — Progress Notes (Unsigned)
 Enrolled for [employer] to mail a ZIO XT long term holter monitor to the patients address on file.

## 2023-06-12 ENCOUNTER — Emergency Department (HOSPITAL_COMMUNITY)

## 2023-06-12 ENCOUNTER — Emergency Department (HOSPITAL_COMMUNITY)
Admission: EM | Admit: 2023-06-12 | Discharge: 2023-06-12 | Disposition: A | Discharge status: Home / Self Care | Attending: Emergency Medicine | Admitting: Emergency Medicine

## 2023-06-12 ENCOUNTER — Encounter (HOSPITAL_COMMUNITY)

## 2023-06-12 ENCOUNTER — Other Ambulatory Visit: Payer: Self-pay

## 2023-06-12 ENCOUNTER — Encounter (HOSPITAL_COMMUNITY): Payer: Self-pay

## 2023-06-12 ENCOUNTER — Emergency Department (HOSPITAL_BASED_OUTPATIENT_CLINIC_OR_DEPARTMENT_OTHER)

## 2023-06-12 DIAGNOSIS — R7989 Other specified abnormal findings of blood chemistry: Secondary | ICD-10-CM

## 2023-06-12 DIAGNOSIS — R9389 Abnormal findings on diagnostic imaging of other specified body structures: Secondary | ICD-10-CM | POA: Diagnosis not present

## 2023-06-12 DIAGNOSIS — R0602 Shortness of breath: Secondary | ICD-10-CM | POA: Insufficient documentation

## 2023-06-12 DIAGNOSIS — M79661 Pain in right lower leg: Secondary | ICD-10-CM

## 2023-06-12 DIAGNOSIS — R791 Abnormal coagulation profile: Secondary | ICD-10-CM | POA: Diagnosis not present

## 2023-06-12 DIAGNOSIS — J9809 Other diseases of bronchus, not elsewhere classified: Secondary | ICD-10-CM | POA: Diagnosis not present

## 2023-06-12 DIAGNOSIS — R799 Abnormal finding of blood chemistry, unspecified: Secondary | ICD-10-CM | POA: Diagnosis not present

## 2023-06-12 DIAGNOSIS — J9811 Atelectasis: Secondary | ICD-10-CM | POA: Diagnosis not present

## 2023-06-12 LAB — CBC
Hematocrit: 38.6 % (ref 34.0–46.6)
Hemoglobin: 12.5 g/dL (ref 11.1–15.9)
MCH: 32.1 pg (ref 26.6–33.0)
MCHC: 32.4 g/dL (ref 31.5–35.7)
MCV: 99 fL — ABNORMAL HIGH (ref 79–97)
Platelets: 201 10*3/uL (ref 150–450)
RBC: 3.89 x10E6/uL (ref 3.77–5.28)
RDW: 13.1 % (ref 11.7–15.4)
WBC: 3.3 10*3/uL — ABNORMAL LOW (ref 3.4–10.8)

## 2023-06-12 LAB — BASIC METABOLIC PANEL WITH GFR
Anion gap: 11 (ref 5–15)
BUN: 13 mg/dL (ref 8–23)
CO2: 25 mmol/L (ref 22–32)
Calcium: 9.2 mg/dL (ref 8.9–10.3)
Chloride: 106 mmol/L (ref 98–111)
Creatinine, Ser: 1.21 mg/dL — ABNORMAL HIGH (ref 0.44–1.00)
GFR, Estimated: 51 mL/min — ABNORMAL LOW (ref >60)
Glucose, Bld: 79 mg/dL (ref 70–99)
Potassium: 3.6 mmol/L (ref 3.5–5.1)
Sodium: 142 mmol/L (ref 135–145)

## 2023-06-12 LAB — TSH: TSH: 0.041 u[IU]/mL — ABNORMAL LOW (ref 0.450–4.500)

## 2023-06-12 LAB — PRO B NATRIURETIC PEPTIDE: NT-Pro BNP: 177 pg/mL (ref 0–287)

## 2023-06-12 LAB — D-DIMER, QUANTITATIVE: D-DIMER: 2.84 mg{FEU}/L — ABNORMAL HIGH (ref 0.00–0.49)

## 2023-06-12 MED ORDER — IOHEXOL 350 MG/ML SOLN
75.0000 mL | Freq: Once | INTRAVENOUS | Status: AC | PRN
  Administered 2023-06-12: 75 mL via INTRAVENOUS

## 2023-06-12 NOTE — Progress Notes (Signed)
 Venous duplex lower ext  has been completed. Refer to [facility] under chart review to view preliminary results.   06/12/2023  7:00 PM Victoria [name], Hollace Lund

## 2023-06-12 NOTE — ED Provider Notes (Signed)
 [facility] AT [facility] Provider Note   CSN: [id number] Arrival date & time: 06/12/23  1619     History  Chief Complaint  Patient presents with   abnormal labs   Shortness of Breath    Victoria [name] is a 63 y.o. female.  Patient seen by cardiology yesterday.  Had lab work done.  D-dimer was markedly elevated.  Patient contacted to come in for for CT angio chest.  Patient's actually had some shortness of breath for about 5 months.  Denies any leg swelling.  Maybe some random bruising and some leg discomfort the last few months.  No chest pain associated with it no dizziness.  Patient's workup yesterday included relatively normal BMP.  CBC normal other than white count being a little bit low.  They did not do basic metabolic panel.  Based on that we will go ahead and get basic metabolic panel and get CT angio chest.  Because her symptoms are a little bit atypical.  And prolonged for the last several weeks.  Will get graft bilateral lower extremity Doppler studies as well.       Home Medications Prior to Admission medications   Medication Sig Start Date End Date Taking? Authorizing Provider  albuterol (VENTOLIN HFA) 108 (90 Base) MCG/ACT inhaler Inhale 2 puffs into the lungs every 4 hours as needed for wheezing or shortness of breath. 10/17/22   Sean Czar, MD  budesonide (ENTOCORT EC) 3 MG 24 hr capsule Take 3 capsules (9 mg total) by mouth in the morning. 06/14/22     buPROPion (WELLBUTRIN XL) 150 MG 24 hr tablet TAKE 1 TABLET BY MOUTH EVERY DAY 05/30/23   [name] Evener, MD  Carbinoxamine Maleate 4 MG TABS Take 1-2 tablets at night as needed for drainage and allergies. May cause drowsiness. Patient not taking: Reported on 06/11/2023 05/09/22   Sean Czar, MD  cyanocobalamin (DODEX) 1000 MCG/ML injection INJECT 1 ML INTRAMUSCULARLY EVERY MONTH 12/25/22 12/25/23  [name] Evener, MD  doxepin (SINEQUAN) 50 MG capsule Take 1 capsule (50 mg  total) by mouth at bedtime. Patient not taking: Reported on 06/11/2023 12/04/22   [name] Evener, MD  ergocalciferol (DRISDOL) 1.25 MG (50000 UT) capsule Take 1 capsule (50,000 Units total) by mouth once a week. 12/04/22   [name] Evener, MD  escitalopram (LEXAPRO) 10 MG tablet TAKE 1 TABLET BY MOUTH EVERY DAY Patient taking differently: Take 5 mg by mouth daily. 05/30/23   [name] Evener, MD  Estradiol 0.75 MG/1.25 GM (0.06%) topical gel Place 1 pump (1.25 g) onto the skin every other day. 04/01/23   Amundson C Silva, Brook E, MD  flecainide (TAMBOCOR) 50 MG tablet TAKE 1 TABLET EVERY DAY AT 6 AM 06/12/23   Jacqueline Matsu, MD  gabapentin (NEURONTIN) 600 MG tablet Take 1 tablet (600 mg total) by mouth 3 (three) times daily. 09/19/22   [name] Evener, MD  HYDROcodone-acetaminophen (NORCO/VICODIN) 5-325 MG tablet Take 1 tablet by mouth every 6 (six) hours as needed for severe pain. 12/04/22   [name] Evener, MD  HYRIMOZ 40 MG/0.4ML SOAJ  06/10/23   [provider]  ipratropium (ATROVENT) 0.03 % nasal spray Use 1-2 sprays in each nostril up to 3 times daily as needed for runny nose. Can take 15 minutes prior to meals. 05/09/22   Sean Czar, MD  levothyroxine (SYNTHROID) 112 MCG tablet Take 1 tablet (112 mcg total) by mouth daily. 12/19/22  Margaree Mackintosh, MD  LORazepam (ATIVAN) 1 MG tablet TAKE 1 TABLET BY MOUTH TWICE A DAY FOR ANXIETY 03/18/23   Margaree Mackintosh, MD  metoprolol succinate (TOPROL-XL) 25 MG 24 hr tablet TAKE 1 TABLET BY MOUTH EVERY DAY 06/12/23   Quintella Reichert, MD  Olopatadine HCl 0.2 % SOLN Apply 1 drop to eye daily as needed. Patient taking differently: Apply 1 drop to eye daily as needed (allergies). 08/23/21   Verlee Monte, MD  promethazine (PHENERGAN) 25 MG tablet Take 1 tablet (25 mg total) by mouth every 8 (eight) hours as needed for nausea and/or vomiting 05/20/23 05/19/24  Margaree Mackintosh, MD  tiZANidine (ZANAFLEX) 4 MG tablet Take 1 tablet (4 mg total) by mouth 3 (three) times  daily. 05/14/23   Margaree Mackintosh, MD  Ubrogepant (UBRELVY) 100 MG TABS Take 1 tablet (100 mg total) by mouth every 2 (two) hours as needed. Maximum 200mg  a day. 07/02/22   Anson Fret, MD      Allergies    Other and Estradiol    Review of Systems   Review of Systems  Constitutional:  Negative for chills and fever.  HENT:  Negative for ear pain and sore throat.   Eyes:  Negative for pain and visual disturbance.  Respiratory:  Positive for shortness of breath. Negative for cough.   Cardiovascular:  Negative for chest pain, palpitations and leg swelling.  Gastrointestinal:  Negative for abdominal pain and vomiting.  Genitourinary:  Negative for dysuria and hematuria.  Musculoskeletal:  Negative for arthralgias and back pain.  Skin:  Negative for color change and rash.  Neurological:  Negative for seizures and syncope.  All other systems reviewed and are negative.   Physical Exam Updated Vital Signs BP 131/66   Pulse 64   Temp 97.7 F (36.5 C)   Resp 12   Ht 1.702 m (5\' 7" )   Wt 134 kg   SpO2 100%   BMI 46.27 kg/m  Physical Exam Vitals and nursing note reviewed.  Constitutional:      General: She is not in acute distress.    Appearance: Normal appearance. She is well-developed.  HENT:     Head: Normocephalic and atraumatic.  Eyes:     Extraocular Movements: Extraocular movements intact.     Conjunctiva/sclera: Conjunctivae normal.     Pupils: Pupils are equal, round, and reactive to light.  Cardiovascular:     Rate and Rhythm: Normal rate and regular rhythm.     Heart sounds: No murmur heard. Pulmonary:     Effort: Pulmonary effort is normal. No respiratory distress.     Breath sounds: Normal breath sounds.  Abdominal:     Palpations: Abdomen is soft.     Tenderness: There is no abdominal tenderness.  Musculoskeletal:        General: No swelling.     Cervical back: Normal range of motion and neck supple.     Right lower leg: No edema.     Left lower leg: No  edema.  Skin:    General: Skin is warm and dry.     Capillary Refill: Capillary refill takes less than 2 seconds.  Neurological:     General: No focal deficit present.     Mental Status: She is alert and oriented to person, place, and time.  Psychiatric:        Mood and Affect: Mood normal.     ED Results / Procedures / Treatments   Labs (  all labs ordered are listed, but only abnormal results are displayed) Labs Reviewed  BASIC METABOLIC PANEL WITH GFR - Abnormal; Notable for the following components:      Result Value   Creatinine, Ser 1.21 (*)    GFR, Estimated 51 (*)    All other components within normal limits    EKG None  Radiology CT Angio Chest PE W/Cm &/Or Wo Cm Result Date: 06/12/2023 CLINICAL DATA:  Low to intermediate probability pulmonary embolism, positive D-dimer EXAM: CT ANGIOGRAPHY CHEST WITH CONTRAST TECHNIQUE: Multidetector CT imaging of the chest was performed using the standard protocol during bolus administration of intravenous contrast. Multiplanar CT image reconstructions and MIPs were obtained to evaluate the vascular anatomy. RADIATION DOSE REDUCTION: This exam was performed according to the departmental dose-optimization program which includes automated exposure control, adjustment of the mA and/or kV according to patient size and/or use of iterative reconstruction technique. CONTRAST:  75mL OMNIPAQUE IOHEXOL 350 MG/ML SOLN COMPARISON:  None Available. FINDINGS: Cardiovascular: Adequate opacification of the pulmonary arterial tree. No intraluminal filling defect identified to suggest acute pulmonary embolism. Central pulmonary arteries are of normal caliber. No significant coronary artery calcification. Cardiac size within normal limits. No pericardial effusion. No significant atherosclerotic calcification within the thoracic aorta. No aortic aneurysm. Mediastinum/Nodes: No enlarged mediastinal, hilar, or axillary lymph nodes. Thyroid gland, trachea, and  esophagus demonstrate no significant findings. Lungs/Pleura: Mild elevation of the left hemidiaphragm. Mild bibasilar atelectasis. Lungs are otherwise clear. No pneumothorax or pleural effusion. 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 Mild bronchial wall thickening present in keeping with airway inflammation. Upper Abdomen: No acute abnormality. 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 No acute bone abnormality. No suspicious lytic or blastic bone lesions. Review of the MIP images confirms the above findings. IMPRESSION: 1. No acute pulmonary embolism. 2. Mild bronchial wall thickening in keeping with airway inflammation. 3. 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 4. Mild elevation of the left hemidiaphragm. 5. 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 Electronically Signed   By: Helyn Numbers M.D.   On: 06/12/2023 22:21   VAS [place] LOWER EXTREMITY VENOUS (DVT) (7a-7p) Result Date: 06/12/2023  Lower Venous DVT Study Patient Name:  Victoria [name]  Date of Exam:   06/12/2023 Medical Rec #: [id number]                  Accession #:    [id number] Date of Birth: 06/20/1960                 Patient Gender: F Patient Age:   1 years Exam Location:  [facility] Procedure:      VAS [place] LOWER EXTREMITY VENOUS (DVT) Referring Phys: Lorin Picket Nikira Kushnir --------------------------------------------------------------------------------  Indications: SOB, and Pain.  Limitations: Body habitus. Comparison Study: 07/18/22 - Negative right lower extremity Performing Technologist: Varna Sink Sturdivant-Jones RDMS, RVT  Examination Guidelines: A complete evaluation includes B-mode imaging, spectral Doppler, color Doppler, and power Doppler as needed of all accessible portions  of each vessel. Bilateral testing is considered an integral part of a complete examination. Limited examinations for reoccurring indications may be performed as noted. The reflux portion of the exam is performed with the patient in reverse Trendelenburg.  +---------+---------------+---------+-----------+----------+--------------+ RIGHT    CompressibilityPhasicitySpontaneityPropertiesThrombus Aging +---------+---------------+---------+-----------+----------+--------------+ CFV      Full  Yes      Yes                                 +---------+---------------+---------+-----------+----------+--------------+ SFJ      Full                                                        +---------+---------------+---------+-----------+----------+--------------+ FV Prox  Full                                                        +---------+---------------+---------+-----------+----------+--------------+ FV Mid   Full                                                        +---------+---------------+---------+-----------+----------+--------------+ FV DistalFull                                                        +---------+---------------+---------+-----------+----------+--------------+ PFV      Full                                                        +---------+---------------+---------+-----------+----------+--------------+ POP      Full           Yes      Yes                                 +---------+---------------+---------+-----------+----------+--------------+ PTV      Full                                                        +---------+---------------+---------+-----------+----------+--------------+ PERO     Full                                                        +---------+---------------+---------+-----------+----------+--------------+   +---------+---------------+---------+-----------+----------+--------------+ LEFT      CompressibilityPhasicitySpontaneityPropertiesThrombus Aging +---------+---------------+---------+-----------+----------+--------------+ CFV      Full           Yes      Yes                                 +---------+---------------+---------+-----------+----------+--------------+ SFJ  Full                                                        +---------+---------------+---------+-----------+----------+--------------+ FV Prox  Full                                                        +---------+---------------+---------+-----------+----------+--------------+ FV Mid   Full                                                        +---------+---------------+---------+-----------+----------+--------------+ FV DistalFull                                                        +---------+---------------+---------+-----------+----------+--------------+ PFV      Full                                                        +---------+---------------+---------+-----------+----------+--------------+ POP      Full           Yes      Yes                                 +---------+---------------+---------+-----------+----------+--------------+ PTV      Full                                                        +---------+---------------+---------+-----------+----------+--------------+ PERO     Full                                                        +---------+---------------+---------+-----------+----------+--------------+     Summary: BILATERAL: - No evidence of deep vein thrombosis seen in the lower extremities, bilaterally. -No evidence of popliteal cyst, bilaterally.   *See table(s) above for measurements and observations. Electronically signed by Gerarda Fraction on 06/12/2023 at 7:55:37 PM.    Final     Procedures Procedures    Medications Ordered in ED Medications  iohexol (OMNIPAQUE) 350 MG/ML injection 75 mL (75 mLs Intravenous Contrast  Given 06/12/23 2026)    ED Course/ Medical Decision Making/ A&P  Medical Decision Making Amount and/or Complexity of Data Reviewed Labs: ordered. Radiology: ordered.  Risk Prescription drug management.  CT angio chest no evidence of any acute pulmonary embolism mild bronchial wall thickening in keeping with airway inflammation.  Some bronchomalacia involving the right bronchus and left lower lobe bronchus.  Has some surgical changes of Roux-en-Y gastric bypass and cholecystectomy.  So no explanation for the concerns and the elevated D-dimer was the reason for referral in and no evidence of any DVTs in lower extremities no evidence of any pulmonary embolus.  Patient stable for discharge home and follow-up with her doctors.  Final Clinical Impression(s) / ED Diagnoses Final diagnoses:  Elevated d-dimer    Rx / DC Orders ED Discharge Orders     None         Nicklas Barns, MD 06/12/23 2300

## 2023-06-12 NOTE — ED Triage Notes (Signed)
 Pt arrived reporting she has been shob for a while and went to pcp last week. Blood work showed elevated D dimer. Denies any leg swelling. States noticed random bruising to ble lower extremity over the last few months. No recent travel. Denies cp, dizziness.

## 2023-06-12 NOTE — Discharge Instructions (Signed)
 Workup for the elevated D-dimer without any evidence of any blood clots in the legs and no evidence of any blood clots in the lungs.  Would recommend following up with your regular doctor and cardiology as planned.  Return for any new or worse symptoms.

## 2023-06-14 ENCOUNTER — Other Ambulatory Visit: Payer: Self-pay | Admitting: Internal Medicine

## 2023-06-17 ENCOUNTER — Other Ambulatory Visit: Payer: Self-pay

## 2023-06-17 DIAGNOSIS — E039 Hypothyroidism, unspecified: Secondary | ICD-10-CM

## 2023-06-17 MED ORDER — LEVOTHYROXINE SODIUM 112 MCG PO TABS: 112.0000 ug | ORAL_TABLET | Freq: Every day | ORAL | 1 refills | Status: DC

## 2023-06-18 ENCOUNTER — Ambulatory Visit: Payer: Self-pay

## 2023-06-18 NOTE — Addendum Note (Signed)
 Addended by: Jacqueline Matsu on: 06/18/2023 08:43 AM   Modules accepted: Orders

## 2023-06-18 NOTE — Telephone Encounter (Signed)
 Copied from CRM [id number]. Topic: Clinical - Red Word Triage >> Jun 18, 2023  9:40 AM Crist Dominion wrote: Red Word that prompted transfer to Nurse Triage: Shortness of breath  E2C2 Pulmonary Triage - Initial Assessment Questions "Chief Complaint (e.g., cough, sob, wheezing, fever, chills, sweat or additional symptoms) *Go to specific symptom protocol after initial questions. Shortness of breath   "How long have symptoms been present?" 1-2 weeks  Have you tested for COVID or Flu? Note: If not, ask patient if a home test can be taken. If so, instruct patient to call back for positive results. No  MEDICINES:   "Have you used any OTC meds to help with symptoms?" No If yes, ask "What medications?" N/A  "Have you used your inhalers/maintenance medication?" Yes If yes, "What medications?" Albuterol  inhaler   If inhaler, ask "How many puffs and how often?" Note: Review instructions on medication in the chart. 2 puffs, several times a week  OXYGEN: "Do you wear supplemental oxygen?" No If yes, "How many liters are you supposed to use?" N/A  "Do you monitor your oxygen levels?" No If yes, "What is your reading (oxygen level) today?" N/A  "What is your usual oxygen saturation reading?"  (Note: Pulmonary O2 sats should be 90% or greater) N/A  Reason for Disposition  [1] Longstanding difficulty breathing (e.g., CHF, COPD, emphysema) AND [2] WORSE than normal    Pulmonary patient, appointment scheduled for tomorrow  Answer Assessment - Initial Assessment Questions 1. RESPIRATORY STATUS: "Describe your breathing?" (e.g., wheezing, shortness of breath, unable to speak, severe coughing)      Shortness of breath  2. ONSET: "When did this breathing problem begin?"      1-2 weeks 3. PATTERN "Does the difficult breathing come and go, or has it been constant since it started?"      Constant  4. SEVERITY: "How bad is your breathing?" (e.g., mild, moderate, severe)    - MILD: No SOB at rest,  mild SOB with walking, speaks normally in sentences, can lie down, no retractions, pulse < 100.    - MODERATE: SOB at rest, SOB with minimal exertion and prefers to sit, cannot lie down flat, speaks in phrases, mild retractions, audible wheezing, pulse 100-120.    - SEVERE: Very SOB at rest, speaks in single words, struggling to breathe, sitting hunched forward, retractions, pulse > 120      Moderate to severe  5. RECURRENT SYMPTOM: "Have you had difficulty breathing before?" If Yes, ask: "When was the last time?" and "What happened that time?"      Yes 6. CARDIAC HISTORY: "Do you have any history of heart disease?" (e.g., heart attack, angina, bypass surgery, angioplasty)      Yes 7. LUNG HISTORY: "Do you have any history of lung disease?"  (e.g., pulmonary embolus, asthma, emphysema)     Yes 8. CAUSE: "What do you think is causing the breathing problem?"      Unsure  9. OTHER SYMPTOMS: "Do you have any other symptoms? (e.g., dizziness, runny nose, cough, chest pain, fever)     Cough 10. O2 SATURATION MONITOR:  "Do you use an oxygen saturation monitor (pulse oximeter) at home?" If Yes, ask: "What is your reading (oxygen level) today?" "What is your usual oxygen saturation reading?" (e.g., 95%)       Has not been checking  Protocols used: Breathing Difficulty-A-AH

## 2023-06-19 ENCOUNTER — Ambulatory Visit (INDEPENDENT_AMBULATORY_CARE_PROVIDER_SITE_OTHER): Admitting: Pulmonary Disease

## 2023-06-19 ENCOUNTER — Encounter: Payer: Self-pay | Admitting: Obstetrics and Gynecology

## 2023-06-19 ENCOUNTER — Encounter: Payer: Self-pay | Admitting: Pulmonary Disease

## 2023-06-19 VITALS — BP 131/84 | HR 70 | Ht 67.0 in | Wt 299.0 lb

## 2023-06-19 DIAGNOSIS — G4733 Obstructive sleep apnea (adult) (pediatric): Secondary | ICD-10-CM | POA: Diagnosis not present

## 2023-06-19 DIAGNOSIS — R5381 Other malaise: Secondary | ICD-10-CM | POA: Diagnosis not present

## 2023-06-19 DIAGNOSIS — R052 Subacute cough: Secondary | ICD-10-CM | POA: Diagnosis not present

## 2023-06-19 DIAGNOSIS — L732 Hidradenitis suppurativa: Secondary | ICD-10-CM | POA: Diagnosis not present

## 2023-06-19 DIAGNOSIS — R0602 Shortness of breath: Secondary | ICD-10-CM

## 2023-06-19 DIAGNOSIS — L821 Other seborrheic keratosis: Secondary | ICD-10-CM | POA: Diagnosis not present

## 2023-06-19 DIAGNOSIS — B351 Tinea unguium: Secondary | ICD-10-CM | POA: Diagnosis not present

## 2023-06-19 DIAGNOSIS — R062 Wheezing: Secondary | ICD-10-CM | POA: Diagnosis not present

## 2023-06-19 DIAGNOSIS — L814 Other melanin hyperpigmentation: Secondary | ICD-10-CM | POA: Diagnosis not present

## 2023-06-19 DIAGNOSIS — D225 Melanocytic nevi of trunk: Secondary | ICD-10-CM | POA: Diagnosis not present

## 2023-06-19 MED ORDER — BUDESONIDE-FORMOTEROL FUMARATE 160-4.5 MCG/ACT IN AERO: 2.0000 | INHALATION_SPRAY | Freq: Two times a day (BID) | RESPIRATORY_TRACT | 12 refills | Status: AC

## 2023-06-19 NOTE — Patient Instructions (Addendum)
 Try symbicort  inhaler 2 puffs twice daily - rinse mouth out after each use  Continue to use albuterol  inhaler 1-2 puffs every 4-6 hours as needed  Resume CPAP usage at night for at least 4 hours per night  Resume walking on the treadmill  Follow up in 3 months, call sooner if not getting better

## 2023-06-19 NOTE — Progress Notes (Unsigned)
 Synopsis: Referred in *** for ***  Subjective:   PATIENT ID: Victoria [name] GENDER: female DOB: 08-05-1960, MRN: [id number]   HPI  Chief Complaint  Patient presents with   Acute Visit    Pt states she was told she had mid inflammation, SOB     ***  Past Medical History:  Diagnosis Date   Anxiety    Back pain    Bursitis    right shoulder   Chronic kidney disease    hematuria, has been worked up, her normal   Chronic leukopenia    intermittant since 2010, followed by Dr. Liane Redman now   Chronic neck pain    Constipation    Degenerative joint disease of low back 09/02/2015   Dr. Rossie Coon   Displacement of lumbar intervertebral disc    Dysrhythmia    Tachycardia, PVCs, PACs   Edema of both lower legs    Fibrocystic breast    Gallbladder problem    GERD (gastroesophageal reflux disease)    Headache    per pt more stress/tension   Heart palpitations    SEE EPIC ENCOUTNER , CARDIOLOGY DR. Sophia Dustman TURNER 2018; reports on 05-16-17 " i haven't had any bouts of those lately"     Hemorrhoids    Hidradenitis suppurativa    History of Clostridium difficile infection 12/2010   History of exercise intolerance    ETT on 04-27-2016-- negative ([facility] treadmill score 7)   History of Helicobacter pylori infection 2001 and 09/ 2012   HSV-2 infection    genital   Hx of adenomatous colonic polyps 10/17/2007   Hydradenitis    per pt currently treated with Humira  injection   Hypertension    Hyperthyroidism    Hypocupremia    Hypothyroidism    Hypothyroidism, postsurgical 1980   IBS (irritable bowel syndrome)    Insomnia    Iron deficiency    Joint pain    Leukopenia    Lumbosacral radiculopathy    Lymphocytic colitis 05/14/2022   dx from colonoscopy   Medial meniscus tear    right knee   Mild obstructive sleep apnea    Mitral regurgitation    Mild to moderate by echo 09/2021   Numbness and tingling of right arm    Obesity    Osteoarthritis    Palpitations     Pernicious anemia    b12 def   Peroneal neuropathy    PONV (postoperative nausea and vomiting)    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   Prediabetes    Reactive hypoglycemia followed by pcp   post gastrectomy dumping syndrome   SOB (shortness of breath)    Spinal stenosis    Tendonitis    right shoulder   Varicose veins    Vertigo    Vitamin B 12 deficiency    Vitamin D  deficiency      Family History  Problem Relation Age of Onset   High blood pressure Mother    Hypertension Mother    Headache Mother    High Cholesterol Mother    Thyroid  disease Mother    Obesity Mother    Kidney disease Mother    Hypertension Father    Diabetes Father    High blood pressure Father    High Cholesterol Father    Heart disease Father    Obesity Father    Diabetes Sister    Hypertension Sister    Thyroid  disease Brother    Thyroid  disease Maternal Aunt  Migraines Neg Hx      Social History   Socioeconomic History   Marital status: Married    Spouse name: Charlann Confer   Number of children: 2   Years of education: Not on file   Highest education level: Not on file  Occupational History   Occupation: Armed forces operational officer GI    Employer: [employer]  Tobacco Use   Smoking status: Never   Smokeless tobacco: Never  Vaping Use   Vaping status: Never Used  Substance and Sexual Activity   Alcohol use: No   Drug use: No   Sexual activity: Not Currently    Partners: Male    Birth control/protection: Surgical    Comment: hysterectomy  Other Topics Concern   Not on file  Social History Narrative   Lives at home with spouse   Right handed   Caffeine: diet zero mtn dew, occasionally    Social Drivers of Health   Financial Resource Strain: Not on file  Food Insecurity: No Food Insecurity (07/13/2022)   Hunger Vital Sign    Worried About Running Out of Food in the Last Year: Never true    Ran Out of Food in the Last Year: Never true  Transportation Needs: No Transportation Needs  (07/13/2022)   PRAPARE - Administrator, Civil Service (Medical): No    Lack of Transportation (Non-Medical): No  Physical Activity: Not on file  Stress: Not on file  Social Connections: Not on file  Intimate Partner Violence: Not At Risk (07/13/2022)   Humiliation, Afraid, Rape, and Kick questionnaire    Fear of Current or Ex-Partner: No    Emotionally Abused: No    Physically Abused: No    Sexually Abused: No     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   Estradiol  Rash and Other (See Comments)    Local rash with estradiol  patches     Outpatient Medications Prior to Visit  Medication Sig Dispense Refill   albuterol  (VENTOLIN  HFA) 108 (90 Base) MCG/ACT inhaler Inhale 2 puffs into the lungs every 4 hours as needed for wheezing or shortness of breath. 18 g 3   budesonide  (ENTOCORT EC ) 3 MG 24 hr capsule Take 3 capsules (9 mg total) by mouth in the morning. 270 capsule 3   buPROPion  (WELLBUTRIN  XL) 150 MG 24 hr tablet TAKE 1 TABLET BY MOUTH EVERY DAY 90 tablet 1   Carbinoxamine  Maleate 4 MG TABS Take 1-2 tablets at night as needed for drainage and allergies. May cause drowsiness. 28 tablet 5   cyanocobalamin  (DODEX ) 1000 MCG/ML injection INJECT 1 ML INTRAMUSCULARLY EVERY MONTH 3 mL 11   doxepin  (SINEQUAN ) 50 MG capsule Take 1 capsule (50 mg total) by mouth at bedtime. 90 capsule 0   ergocalciferol  (DRISDOL ) 1.25 MG (50000 UT) capsule Take 1 capsule (50,000 Units total) by mouth once a week. 12 capsule 1   escitalopram  (LEXAPRO ) 10 MG tablet TAKE 1 TABLET BY MOUTH EVERY DAY (Patient taking differently: Take 5 mg by mouth daily.) 90 tablet 1   Estradiol  0.75 MG/1.25 GM (0.06%) topical gel Place 1 pump (1.25 g) onto the skin every other day. 50 g 3   flecainide  (TAMBOCOR ) 50 MG tablet TAKE 1 TABLET EVERY DAY AT 6 AM 90 tablet 3   gabapentin  (NEURONTIN ) 600 MG tablet Take 1 tablet (600 mg total) by  mouth 3 (three) times daily. 90 tablet  5   HYDROcodone -acetaminophen  (NORCO/VICODIN) 5-325 MG tablet Take 1 tablet by mouth every 6 (six) hours as needed for severe pain. 12 tablet 0   HYRIMOZ  40 MG/0.4ML SOAJ      ipratropium (ATROVENT ) 0.03 % nasal spray Use 1-2 sprays in each nostril up to 3 times daily as needed for runny nose. Can take 15 minutes prior to meals. 30 mL 3   levothyroxine  (SYNTHROID ) 112 MCG tablet TAKE 1 TABLET BY MOUTH EVERY DAY 30 tablet 2   levothyroxine  (SYNTHROID ) 112 MCG tablet Take 1 tablet (112 mcg total) by mouth daily. 90 tablet 1   LORazepam  (ATIVAN ) 1 MG tablet TAKE 1 TABLET BY MOUTH TWICE A DAY FOR ANXIETY 60 tablet 3   metoprolol  succinate (TOPROL -XL) 25 MG 24 hr tablet TAKE 1 TABLET BY MOUTH EVERY DAY 90 tablet 3   Olopatadine  HCl 0.2 % SOLN Apply 1 drop to eye daily as needed. (Patient taking differently: Apply 1 drop to eye daily as needed (allergies).) 2.5 mL 5   promethazine  (PHENERGAN ) 25 MG tablet Take 1 tablet (25 mg total) by mouth every 8 (eight) hours as needed for nausea and/or vomiting 30 tablet 5   tiZANidine  (ZANAFLEX ) 4 MG tablet Take 1 tablet (4 mg total) by mouth 3 (three) times daily. 90 tablet 3   Ubrogepant  (UBRELVY ) 100 MG TABS Take 1 tablet (100 mg total) by mouth every 2 (two) hours as needed. Maximum 200mg  a day. 16 tablet 11   Facility-Administered Medications Prior to Visit  Medication Dose Route Frequency Provider Last Rate Last Admin   gadobenate dimeglumine  (MULTIHANCE ) injection 20 mL  20 mL Intravenous Once PRN Glory Larsen, MD        ROS    Objective:   Vitals:   06/19/23 1452  BP: 131/84  Pulse: 70  SpO2: 99%  Weight: 299 lb (135.6 kg)  Height: 5\' 7"  (1.702 m)     Physical Exam    CBC    Component Value Date/Time   WBC 3.3 (L) 06/11/2023 0902   WBC 4.7 10/09/2022 0436   RBC 3.89 06/11/2023 0902   RBC 3.85 (L) 10/09/2022 0436   HGB 12.5 06/11/2023 0902   HGB 12.6 07/09/2016 1529   HCT 38.6 06/11/2023  0902   HCT 38.2 07/09/2016 1529   PLT 201 06/11/2023 0902   MCV 99 (H) 06/11/2023 0902   MCV 96.9 07/09/2016 1529   MCH 32.1 06/11/2023 0902   MCH 31.2 10/09/2022 0436   MCHC 32.4 06/11/2023 0902   MCHC 31.8 10/09/2022 0436   RDW 13.1 06/11/2023 0902   RDW 13.5 07/09/2016 1529   LYMPHSABS 2.2 10/09/2022 0436   LYMPHSABS 1.3 09/03/2022 1425   LYMPHSABS 1.6 07/09/2016 1529   MONOABS 0.4 10/09/2022 0436   MONOABS 0.3 07/09/2016 1529   EOSABS 0.1 10/09/2022 0436   EOSABS 0.1 09/03/2022 1425   BASOSABS 0.0 10/09/2022 0436   BASOSABS 0.0 09/03/2022 1425   BASOSABS 0.0 07/09/2016 1529     Chest imaging:  PFT:    Latest Ref Rng & Units 02/15/2022    9:05 AM  PFT Results  FVC-Pre L 3.01   FVC-Predicted Pre % 82   FVC-Post L 2.99   FVC-Predicted Post % 81   Pre FEV1/FVC % % 86   Post FEV1/FCV % % 86   FEV1-Pre L 2.59   FEV1-Predicted Pre % 91   FEV1-Post L 2.58   DLCO uncorrected ml/min/mmHg 20.51   DLCO UNC% % 92   DLCO  corrected ml/min/mmHg 20.91   DLCO COR %Predicted % 93   DLVA Predicted % 109   TLC L 5.02   TLC % Predicted % 91   RV % Predicted % 90     Labs:  Path:  Echo:  Heart Catheterization:       Assessment & Plan:   No diagnosis found.  Discussion: ***    Current Outpatient Medications:    albuterol  (VENTOLIN  HFA) 108 (90 Base) MCG/ACT inhaler, Inhale 2 puffs into the lungs every 4 hours as needed for wheezing or shortness of breath., Disp: 18 g, Rfl: 3   budesonide  (ENTOCORT EC ) 3 MG 24 hr capsule, Take 3 capsules (9 mg total) by mouth in the morning., Disp: 270 capsule, Rfl: 3   buPROPion  (WELLBUTRIN  XL) 150 MG 24 hr tablet, TAKE 1 TABLET BY MOUTH EVERY DAY, Disp: 90 tablet, Rfl: 1   Carbinoxamine  Maleate 4 MG TABS, Take 1-2 tablets at night as needed for drainage and allergies. May cause drowsiness., Disp: 28 tablet, Rfl: 5   cyanocobalamin  (DODEX ) 1000 MCG/ML injection, INJECT 1 ML INTRAMUSCULARLY EVERY MONTH, Disp: 3 mL, Rfl: 11    doxepin  (SINEQUAN ) 50 MG capsule, Take 1 capsule (50 mg total) by mouth at bedtime., Disp: 90 capsule, Rfl: 0   ergocalciferol  (DRISDOL ) 1.25 MG (50000 UT) capsule, Take 1 capsule (50,000 Units total) by mouth once a week., Disp: 12 capsule, Rfl: 1   escitalopram  (LEXAPRO ) 10 MG tablet, TAKE 1 TABLET BY MOUTH EVERY DAY (Patient taking differently: Take 5 mg by mouth daily.), Disp: 90 tablet, Rfl: 1   Estradiol  0.75 MG/1.25 GM (0.06%) topical gel, Place 1 pump (1.25 g) onto the skin every other day., Disp: 50 g, Rfl: 3   flecainide  (TAMBOCOR ) 50 MG tablet, TAKE 1 TABLET EVERY DAY AT 6 AM, Disp: 90 tablet, Rfl: 3   gabapentin  (NEURONTIN ) 600 MG tablet, Take 1 tablet (600 mg total) by mouth 3 (three) times daily., Disp: 90 tablet, Rfl: 5   HYDROcodone -acetaminophen  (NORCO/VICODIN) 5-325 MG tablet, Take 1 tablet by mouth every 6 (six) hours as needed for severe pain., Disp: 12 tablet, Rfl: 0   HYRIMOZ  40 MG/0.4ML SOAJ, , Disp: , Rfl:    ipratropium (ATROVENT ) 0.03 % nasal spray, Use 1-2 sprays in each nostril up to 3 times daily as needed for runny nose. Can take 15 minutes prior to meals., Disp: 30 mL, Rfl: 3   levothyroxine  (SYNTHROID ) 112 MCG tablet, TAKE 1 TABLET BY MOUTH EVERY DAY, Disp: 30 tablet, Rfl: 2   levothyroxine  (SYNTHROID ) 112 MCG tablet, Take 1 tablet (112 mcg total) by mouth daily., Disp: 90 tablet, Rfl: 1   LORazepam  (ATIVAN ) 1 MG tablet, TAKE 1 TABLET BY MOUTH TWICE A DAY FOR ANXIETY, Disp: 60 tablet, Rfl: 3   metoprolol  succinate (TOPROL -XL) 25 MG 24 hr tablet, TAKE 1 TABLET BY MOUTH EVERY DAY, Disp: 90 tablet, Rfl: 3   Olopatadine  HCl 0.2 % SOLN, Apply 1 drop to eye daily as needed. (Patient taking differently: Apply 1 drop to eye daily as needed (allergies).), Disp: 2.5 mL, Rfl: 5   promethazine  (PHENERGAN ) 25 MG tablet, Take 1 tablet (25 mg total) by mouth every 8 (eight) hours as needed for nausea and/or vomiting, Disp: 30 tablet, Rfl: 5   tiZANidine  (ZANAFLEX ) 4 MG tablet, Take 1  tablet (4 mg total) by mouth 3 (three) times daily., Disp: 90 tablet, Rfl: 3   Ubrogepant  (UBRELVY ) 100 MG TABS, Take 1 tablet (100 mg total) by mouth every  2 (two) hours as needed. Maximum 200mg  a day., Disp: 16 tablet, Rfl: 11 No current facility-administered medications for this visit.  Facility-Administered Medications Ordered in Other Visits:    gadobenate dimeglumine  (MULTIHANCE ) injection 20 mL, 20 mL, Intravenous, Once PRN, Glory Larsen, MD

## 2023-06-20 NOTE — Telephone Encounter (Signed)
 Spoke with patient, patient scheduled by front office for OV on 06/21/23 at 1330 with JC.   Routing to provider for final review. Patient is agreeable to disposition. Will close encounter.   Cc: Jami

## 2023-06-21 ENCOUNTER — Encounter: Payer: Self-pay | Admitting: Radiology

## 2023-06-21 ENCOUNTER — Ambulatory Visit: Admitting: Radiology

## 2023-06-21 ENCOUNTER — Encounter: Payer: Self-pay | Admitting: Pulmonary Disease

## 2023-06-21 VITALS — BP 126/88 | HR 66 | Temp 97.7°F

## 2023-06-21 DIAGNOSIS — N951 Menopausal and female climacteric states: Secondary | ICD-10-CM | POA: Diagnosis not present

## 2023-06-21 DIAGNOSIS — N898 Other specified noninflammatory disorders of vagina: Secondary | ICD-10-CM | POA: Diagnosis not present

## 2023-06-21 DIAGNOSIS — R300 Dysuria: Secondary | ICD-10-CM

## 2023-06-21 DIAGNOSIS — N958 Other specified menopausal and perimenopausal disorders: Secondary | ICD-10-CM

## 2023-06-21 LAB — WET PREP FOR TRICH, YEAST, CLUE

## 2023-06-21 MED ORDER — ESTRADIOL 0.1 MG/24HR TD PTTW
1.0000 | MEDICATED_PATCH | TRANSDERMAL | 12 refills | Status: DC
Start: 2023-06-24 — End: 2023-09-30

## 2023-06-21 MED ORDER — ESTRADIOL 0.1 MG/GM VA CREA: 1.0000 g | TOPICAL_CREAM | VAGINAL | 3 refills | Status: AC

## 2023-06-21 NOTE — Progress Notes (Signed)
 Subjective: Victoria [name] is a 63 y.o. female who complains of dysuria, doesn't feel like her bladder empties completely, vaginal itching, discharge. Symptoms began 5 days ago. Struggles with recurrent BV. Also c/o hot flashes despite being on estradiol  gel 0.75mg .    Review of Systems  All other systems reviewed and are negative.   Past Medical History:  Diagnosis Date   Anxiety    Back pain    Bursitis    right shoulder   Chronic kidney disease    hematuria, has been worked up, her normal   Chronic leukopenia    intermittant since 2010, followed by Dr. Liane Redman now   Chronic neck pain    Constipation    Degenerative joint disease of low back 09/02/2015   Dr. Rossie Coon   Displacement of lumbar intervertebral disc    Dysrhythmia    Tachycardia, PVCs, PACs   Edema of both lower legs    Fibrocystic breast    Gallbladder problem    GERD (gastroesophageal reflux disease)    Headache    per pt more stress/tension   Heart palpitations    SEE EPIC ENCOUTNER , CARDIOLOGY DR. Sophia Dustman TURNER 2018; reports on 05-16-17 " i haven't had any bouts of those lately"     Hemorrhoids    Hidradenitis suppurativa    History of Clostridium difficile infection 12/2010   History of exercise intolerance    ETT on 04-27-2016-- negative ([facility] treadmill score 7)   History of Helicobacter pylori infection 2001 and 09/ 2012   HSV-2 infection    genital   Hx of adenomatous colonic polyps 10/17/2007   Hydradenitis    per pt currently treated with Humira  injection   Hypertension    Hyperthyroidism    Hypocupremia    Hypothyroidism    Hypothyroidism, postsurgical 1980   IBS (irritable bowel syndrome)    Insomnia    Iron deficiency    Joint pain    Leukopenia    Lumbosacral radiculopathy    Lymphocytic colitis 05/14/2022   dx from colonoscopy   Medial meniscus tear    right knee   Mild obstructive sleep apnea    Mitral regurgitation    Mild to moderate by echo 09/2021    Numbness and tingling of right arm    Obesity    Osteoarthritis    Palpitations    Pernicious anemia    b12 def   Peroneal neuropathy    PONV (postoperative nausea and vomiting)    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   Physical Exam Exam conducted with a chaperone present.  Constitutional:      Appearance: Normal appearance. She is obese.  Genitourinary:    General: Normal vulva.     Vagina: Vaginal discharge and erythema (atrophic) present.     Uterus: Absent.   Neurological:     Mental Status: She is  alert.    Urine dipstick shows positive for RBC's and positive for leukocytes.  Micro exam: 20-40 WBC's per HPF, 10-20 RBC's per HPF, and few+ bacteria.   Microscopic wet-mount exam shows negative for pathogens, normal epithelial cells.   Ellis Guys, CMA present for exam  Assessment:/Plan:  1. Dysuria (Primary) - Urinalysis,Complete w/RFL Culture  2. Vaginal itching - WET PREP FOR TRICH, YEAST, CLUE; negative  3. Genitourinary syndrome of menopause Will restart estrogen vaginal cream to prevent recurrent discharge Wean off pantiiliners - estradiol  (ESTRACE  VAGINAL) 0.1 MG/GM vaginal cream; Place 1 g vaginally 3 (three) times a week.  Dispense: 42.5 g; Refill: 3  4. Vasomotor symptoms due to menopause Had localized reaction to a patch previously, willing to try again, removed from allergy  list, not a true allergy , tolerated estrogen gel fine. Will apply flonase to area before applying to reduce skin irritation - estradiol   (VIVELLE -DOT) 0.1 MG/24HR patch; Place 1 patch (0.1 mg total) onto the skin 2 (two) times a week.  Dispense: 8 patch; Refill: 12    Avoid the use pantiliners and soaps/ perfumed products in the peri area. Avoid tub baths and sitting in sweaty or wet clothing for prolonged periods of time.    Shanti Eichel B, NP 2:19 PM

## 2023-06-23 LAB — URINALYSIS, COMPLETE W/RFL CULTURE
Bilirubin Urine: NEGATIVE
Casts: NONE SEEN /LPF
Crystals: NONE SEEN /HPF
Glucose, UA: NEGATIVE
Ketones, ur: NEGATIVE
Nitrites, Initial: NEGATIVE
Protein, ur: NEGATIVE
Specific Gravity, Urine: 1.015 (ref 1.001–1.035)
Yeast: NONE SEEN /HPF
pH: 5.5 (ref 5.0–8.0)

## 2023-06-23 LAB — URINE CULTURE
MICRO NUMBER:: 16376297
SPECIMEN QUALITY:: ADEQUATE

## 2023-06-23 LAB — CULTURE INDICATED

## 2023-06-26 ENCOUNTER — Encounter: Payer: Self-pay | Admitting: Pulmonary Disease

## 2023-06-26 ENCOUNTER — Telehealth: Payer: Self-pay

## 2023-06-26 ENCOUNTER — Other Ambulatory Visit: Payer: Self-pay | Admitting: Internal Medicine

## 2023-06-26 DIAGNOSIS — N393 Stress incontinence (female) (male): Secondary | ICD-10-CM | POA: Diagnosis not present

## 2023-06-26 DIAGNOSIS — R300 Dysuria: Secondary | ICD-10-CM | POA: Diagnosis not present

## 2023-06-26 DIAGNOSIS — J4531 Mild persistent asthma with (acute) exacerbation: Secondary | ICD-10-CM

## 2023-06-26 MED ORDER — AZITHROMYCIN 250 MG PO TABS: ORAL_TABLET | ORAL | 0 refills | Status: DC

## 2023-06-26 MED ORDER — PREDNISONE 10 MG PO TABS: 40.0000 mg | ORAL_TABLET | Freq: Every day | ORAL | 0 refills | Status: DC

## 2023-06-26 NOTE — Telephone Encounter (Signed)
 Sent in steroids and antibiotic  JD

## 2023-06-26 NOTE — Telephone Encounter (Signed)
 Sent in steroid and abx  JD

## 2023-07-01 ENCOUNTER — Ambulatory Visit: Payer: Self-pay | Admitting: Pulmonary Disease

## 2023-07-01 NOTE — Telephone Encounter (Signed)
 Spoke with patient regarding prior message. Advised patient I could get her a office visit to see Katie on 07/03/2023. Patient accepted that office visit. Per Dr.Desai patient can go to urgent care if no office visit .  Patient's voice was understanding.Nothing else further needed.

## 2023-07-01 NOTE — Telephone Encounter (Signed)
 If we don't have an opening in the next 48 hours urgent care would be appropriate

## 2023-07-01 NOTE — Telephone Encounter (Signed)
 Copied from CRM [id number]. Topic: Clinical - Red Word Triage >> Jul 01, 2023 11:45 AM Evie Hoff wrote: [facility] that prompted transfer to Nurse Triage: shortness of breath when moving around . Patient seen dr dewald a few weeks ago and he prescribed me some prednisone  and antibiotics. Patient still having trouble breathing  TRIAGE SUMMARY NOTE: Pt reporting that she was seen by Dr. Diania Fortes on 4/23 for SOB after having ruled out PE in the ED on 4/16, pt been having SOB continuously since before 4/16. Pt sounds hoarse over phone and loses her voice further over the course of the phone call. Pt reporting that she is feeling worse with her SOB, SOB at rest, intermittent wheezing, "when get up and walk around, then my voice goes and I get out of breath," intermittent lightheadedness, coughing fits, was struggling to breathe earlier today, pt confirms she is beginning to catch her breath since sitting down now. Pt reporting she's not been able to do her normal activities with this SOB. Pt used albuterol  inhaler per nurse instructions on phone call and confirms feeling little bit better. Pt reporting that she finished her z-pak and took her last prednisone  dose today. Pt confirms no chest pain or weakness, but "at times" heart pounding, confirms hx of irregular heartbeat, also having "1+ edema" to her ankles. Advised pt go to ED, pt refusing at this time, requesting feedback from pulm. Advised that pt go to hospital with symptoms and hx especially if worsening, call 911 if severe, advised that sending HP message to pulm office for call back to pt with further recommendations. Pt verbalized understanding.  E2C2 Pulmonary Triage - Initial Assessment Questions "Chief Complaint (e.g., cough, sob, wheezing, fever, chills, sweat or additional symptoms) *Go to specific symptom protocol after initial questions. Sounds hoarse When get up and walk around, then my voice goes and I get short of breath Sitting down and relaxing  now, beginning to catch my breath now but still SOB Finished z-pak and last dose prednisone  today SOB hasn't gone anywhere since before 4/16 Turns out asthma in her family Feeling worse than she did Wheezing comes and goes Took albuterol  inhaler and feeling some improvement Was struggling to breathe when called, husband is driving me, breathing hard even at rest, husband noticed it before me, couldn't hardly breathe in store, had to stop to get my breath, wasn't even in there 10 min No chest pain but when get like this I lose my voice Cough but nothing coming up, coughing spells Lightheaded but been trying to diagnose myself and my ankles are swelling probably 1+ edema, had to wear a heart monitor for 2 weeks, just took that off and sent it in This breathing stuff is getting on my nerves At times heart pounding, irregular so take meds Really don't want to go to the ED, didn't see PE and said to follow up with my doc Losing her voice more and more through phone call  "How long have symptoms been present?" 3 weeks  MEDICINES:   "Have you used any OTC meds to help with symptoms?" No  "Have you used your inhalers/maintenance medication?" Yes If yes, "What medications?" Symbicort , rescue inhaler  If inhaler, ask "How many puffs and how often?" Note: Review instructions on medication in the chart. Depends on what I get caught up in doing, used it 3x yesterday, not used today, does help little bit, but when get up and move around go right back, can't get anything done  OXYGEN: "Do you wear supplemental oxygen?" No  "Do you monitor your oxygen levels?" No  Reason for Disposition  [1] MODERATE difficulty breathing (e.g., speaks in phrases, SOB even at rest, pulse 100-120) AND [2] NEW-onset or WORSE than normal  Answer Assessment - Initial Assessment Questions 6. CARDIAC HISTORY: "Do you have any history of heart disease?" (e.g., heart attack, angina, bypass surgery, angioplasty)       signficant 7. LUNG HISTORY: "Do you have any history of lung disease?"  (e.g., pulmonary embolus, asthma, emphysema)     Sleep apnea  Protocols used: Breathing Difficulty-A-AH

## 2023-07-03 ENCOUNTER — Encounter: Payer: Self-pay | Admitting: Internal Medicine

## 2023-07-03 ENCOUNTER — Encounter: Payer: Self-pay | Admitting: Cardiology

## 2023-07-03 DIAGNOSIS — G4733 Obstructive sleep apnea (adult) (pediatric): Secondary | ICD-10-CM | POA: Diagnosis not present

## 2023-07-04 ENCOUNTER — Other Ambulatory Visit: Payer: Self-pay | Admitting: Gastroenterology

## 2023-07-04 ENCOUNTER — Encounter: Payer: Self-pay | Admitting: Nurse Practitioner

## 2023-07-04 ENCOUNTER — Ambulatory Visit: Admitting: Nurse Practitioner

## 2023-07-04 ENCOUNTER — Ambulatory Visit (INDEPENDENT_AMBULATORY_CARE_PROVIDER_SITE_OTHER)

## 2023-07-04 VITALS — BP 124/82 | HR 75 | Ht 67.0 in | Wt 305.2 lb

## 2023-07-04 DIAGNOSIS — R058 Other specified cough: Secondary | ICD-10-CM | POA: Diagnosis not present

## 2023-07-04 DIAGNOSIS — R0609 Other forms of dyspnea: Secondary | ICD-10-CM | POA: Diagnosis not present

## 2023-07-04 DIAGNOSIS — R1310 Dysphagia, unspecified: Secondary | ICD-10-CM

## 2023-07-04 DIAGNOSIS — E785 Hyperlipidemia, unspecified: Secondary | ICD-10-CM

## 2023-07-04 DIAGNOSIS — N2581 Secondary hyperparathyroidism of renal origin: Secondary | ICD-10-CM

## 2023-07-04 DIAGNOSIS — R0602 Shortness of breath: Secondary | ICD-10-CM

## 2023-07-04 DIAGNOSIS — R194 Change in bowel habit: Secondary | ICD-10-CM | POA: Diagnosis not present

## 2023-07-04 DIAGNOSIS — E8770 Fluid overload, unspecified: Secondary | ICD-10-CM

## 2023-07-04 DIAGNOSIS — K9089 Other intestinal malabsorption: Secondary | ICD-10-CM | POA: Diagnosis not present

## 2023-07-04 DIAGNOSIS — E0590 Thyrotoxicosis, unspecified without thyrotoxic crisis or storm: Secondary | ICD-10-CM

## 2023-07-04 DIAGNOSIS — K52832 Lymphocytic colitis: Secondary | ICD-10-CM | POA: Diagnosis not present

## 2023-07-04 DIAGNOSIS — R0989 Other specified symptoms and signs involving the circulatory and respiratory systems: Secondary | ICD-10-CM | POA: Diagnosis not present

## 2023-07-04 LAB — COMPREHENSIVE METABOLIC PANEL WITH GFR
ALT: 21 U/L (ref 0–35)
AST: 16 U/L (ref 0–37)
Albumin: 4 g/dL (ref 3.5–5.2)
Alkaline Phosphatase: 57 U/L (ref 39–117)
BUN: 16 mg/dL (ref 6–23)
CO2: 26 meq/L (ref 19–32)
Calcium: 8.8 mg/dL (ref 8.4–10.5)
Chloride: 106 meq/L (ref 96–112)
Creatinine, Ser: 1.23 mg/dL — ABNORMAL HIGH (ref 0.40–1.20)
GFR: 47.08 mL/min — ABNORMAL LOW (ref >60.00)
Glucose, Bld: 75 mg/dL (ref 70–99)
Potassium: 3.9 meq/L (ref 3.5–5.1)
Sodium: 142 meq/L (ref 135–145)
Total Bilirubin: 0.5 mg/dL (ref 0.2–1.2)
Total Protein: 7.5 g/dL (ref 6.0–8.3)

## 2023-07-04 LAB — CBC WITH DIFFERENTIAL/PLATELET
Basophils Absolute: 0 10*3/uL (ref 0.0–0.1)
Basophils Relative: 0.2 % (ref 0.0–3.0)
Eosinophils Absolute: 0.1 10*3/uL (ref 0.0–0.7)
Eosinophils Relative: 1.9 % (ref 0.0–5.0)
HCT: 35.6 % — ABNORMAL LOW (ref 36.0–46.0)
Hemoglobin: 11.6 g/dL — ABNORMAL LOW (ref 12.0–15.0)
Lymphocytes Relative: 27.2 % (ref 12.0–46.0)
Lymphs Abs: 1.5 10*3/uL (ref 0.7–4.0)
MCHC: 32.5 g/dL (ref 30.0–36.0)
MCV: 98.6 fl (ref 78.0–100.0)
Monocytes Absolute: 0.5 10*3/uL (ref 0.1–1.0)
Monocytes Relative: 8.5 % (ref 3.0–12.0)
Neutro Abs: 3.5 10*3/uL (ref 1.4–7.7)
Neutrophils Relative %: 62.2 % (ref 43.0–77.0)
Platelets: 198 10*3/uL (ref 150.0–400.0)
RBC: 3.61 Mil/uL — ABNORMAL LOW (ref 3.87–5.11)
RDW: 14.5 % (ref 11.5–15.5)
WBC: 5.6 10*3/uL (ref 4.0–10.5)

## 2023-07-04 LAB — LIPID PANEL
Cholesterol: 220 mg/dL — ABNORMAL HIGH (ref 0–200)
HDL: 89.4 mg/dL (ref >39.00)
LDL Cholesterol: 116 mg/dL — ABNORMAL HIGH (ref 0–99)
NonHDL: 130.78
Total CHOL/HDL Ratio: 2
Triglycerides: 73 mg/dL (ref 0.0–149.0)
VLDL: 14.6 mg/dL (ref 0.0–40.0)

## 2023-07-04 LAB — TSH: TSH: 0.84 u[IU]/mL (ref 0.35–5.50)

## 2023-07-04 LAB — BRAIN NATRIURETIC PEPTIDE: Pro B Natriuretic peptide (BNP): 114 pg/mL — ABNORMAL HIGH (ref 0.0–100.0)

## 2023-07-04 LAB — HEMOGLOBIN A1C: Hgb A1c MFr Bld: 6 % (ref 4.6–6.5)

## 2023-07-04 LAB — POCT EXHALED NITRIC OXIDE: FeNO level (ppb): 17

## 2023-07-04 MED ORDER — FLUTICASONE PROPIONATE 50 MCG/ACT NA SUSP: 1.0000 | Freq: Every day | NASAL | 2 refills | Status: DC

## 2023-07-04 MED ORDER — BENZONATATE 200 MG PO CAPS: 200.0000 mg | ORAL_CAPSULE | Freq: Three times a day (TID) | ORAL | 1 refills | Status: AC | PRN

## 2023-07-04 MED ORDER — HYDROCODONE BIT-HOMATROP MBR 5-1.5 MG/5ML PO SOLN: 5.0000 mL | Freq: Four times a day (QID) | ORAL | 0 refills | Status: DC | PRN

## 2023-07-04 MED ORDER — FUROSEMIDE 40 MG PO TABS: 40.0000 mg | ORAL_TABLET | Freq: Every day | ORAL | 0 refills | Status: DC

## 2023-07-04 NOTE — Patient Instructions (Addendum)
 Continue Albuterol  inhaler 2 puffs every 6 hours as needed for shortness of breath or wheezing. Notify if symptoms persist despite rescue inhaler/neb use.  Continue Symbicort  2 puffs Twice daily for now. Brush tongue and rinse mouth afterwards  Start furosemide 40 mg daily for 3 days. Take in AM. May add on potassium depending on your current levels; I'll call you about this. If you develop facial swelling or tongue swelling, go to the ED. Monitor your blood pressure while taking this   Labs and x ray today  I'll reach out to Dr. Micael Adas to see if we can get your echocardiogram moved up  Hycodan cough syrup 5 mL every 8 hours as needed for cough. Do not drive after taking or take with other sedating medications Benzonatate 1 capsule Three times a day for cough Flonase nasal spray 2 sprays each nostril daily until cough improves to target potential postnasal drainage  Keep your appointment with Dr. Liane Redman to review your labs and adjust your Synthroid  if needed  Follow up in 7-10 days with Dr. Diania Fortes or Gina Lagos. Ok to double book KC if no availability on day not already DB with Dr. Diania Fortes. If symptoms do not improve or worsen, please contact office for sooner follow up or seek emergency care.

## 2023-07-04 NOTE — Assessment & Plan Note (Signed)
 Possibly contributing to symptoms. Her PCP, Dr. Liane Redman, has ordered labs. Will collect today so pt does not have to have two lab draws. Plan to route to Dr. Liane Redman to discuss at her follow up tomorrow.

## 2023-07-04 NOTE — Assessment & Plan Note (Addendum)
 Ongoing DOE with progressive worsening and evidence of volume overload. Concern for cardiac component given constellation of symptoms and failure to improve with prednisone  and z pack. Some mild response to SABA. Will recheck BNP and CXR today. Plan to treat with furosemide 40 mg x 3 days and reassess response. Will touch base with Dr. Micael Adas to see about getting echo moved up. Walk test without hypoxia on room air.   Patient Instructions  Continue Albuterol  inhaler 2 puffs every 6 hours as needed for shortness of breath or wheezing. Notify if symptoms persist despite rescue inhaler/neb use.  Continue Symbicort  2 puffs Twice daily for now. Brush tongue and rinse mouth afterwards  Start furosemide 40 mg daily for 3 days. Take in AM. May add on potassium depending on your current levels; I'll call you about this. If you develop facial swelling or tongue swelling, go to the ED. Monitor your blood pressure while taking this   Labs and x ray today  I'll reach out to Dr. Micael Adas to see if we can get your echocardiogram moved up  Hycodan cough syrup 5 mL every 8 hours as needed for cough. Do not drive after taking or take with other sedating medications Benzonatate 1 capsule Three times a day for cough Flonase nasal spray 2 sprays each nostril daily until cough improves to target potential postnasal drainage  Keep your appointment with Dr. Liane Redman to review your labs and adjust your Synthroid  if needed  Follow up in 7-10 days with Dr. Diania Fortes or Gina Lagos. Ok to double book KC if no availability on day not already DB with Dr. Diania Fortes. If symptoms do not improve or worsen, please contact office for sooner follow up or seek emergency care.

## 2023-07-04 NOTE — Progress Notes (Addendum)
 @Patient  ID: Victoria [name], female    DOB: Jan 08, 1961, 63 y.o.   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 Last seen by Dr. Diania Fortes 06/19/2023. Past medical history significant for GERD, hypothyroidism, OA, anxiety, obesity s/p gastric bypass, pernicious anemia, insomnia, IDA.  TEST/EVENTS:  11/09/2021 HST: AHI 11.7, mild OSA 02/15/2022 PFTs: FVC 82, FEV1 91, ratio 86, TLC 91, DLCOcor 93. No BD. Normal PFTs 06/12/2023 CTA chest: negative for PE. Atherosclerosis. Mild elevation with left hemidiaphragm. Mild bibasilar atelectasis. Lungs otherwise clear. 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 Mild bronchial wall thickening. Degenerative disc disease.   10/23/2021: OV with Omri Bertran NP for sleep consult referred by Dr. Micael Adas for excessive daytime fatigue symptoms. She has been struggling with her fatigue for many years now; however, she had COVID in May and feels like her symptoms have been worse since then. She's also having trouble staying asleep at night. She will wake up after about 3-4 hours and struggles to get back to sleep. Sometimes she just gets out of the bed to start her day. Her husband tells her that she snores loudly. She wakes up in the morning with a headache and dry mouth. She denies waking up gasping, witnessed apneas, sleep parasomnias/paralysis, drowsy driving, cataplexy.  She goes to bed around 6 PM.  Sometimes a few hours later.  Sleep onset varies; sometimes she has no difficulties and other times, it takes her a little while. She does take Xanax  to help her fall asleep at night.  Does not necessarily feel anxious but she feels like her mind races.  Wakes up several times throughout  the night; often to use the restroom.  Officially gets out of bed in the morning anytime between 1 AM and 5:15 AM. She has cut back on her caffeine intake throughout the day.  No previous sleep study Had gastric bypass in 2016, lost a significant amount of weight; however, she has slowly put weight back on over the past few years and is up about 40 pounds. 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 Never smoker. She does not drink alcohol. Lives at home with her husband. She has a history of atrial tachycardia and followed by cardiology. She also sees Dr. Cornel Diesel with asthma/allergy  for chronic cough and rhinitis. Her sister has sleep apnea and sleeps with CPAP. Home sleep study ordered.  11/17/2021: OV with Shravan Salahuddin NP for follow-up to discuss home sleep study results.  Her sleep study revealed AHI of 11.7/h consistent with mild OSA.  She continues to struggle with daytime fatigue symptoms and restless sleep at night.  She has had trouble staying asleep for a long time now and usually wakes up after 3 to 4 hours.  She has also been told that she snores loudly.  Commonly wakes up in the morning with a headache or dry mouth.  She denies any sleep parasomnia/paralysis, drowsy driving.  No narcolepsy or cataplexy symptoms. Set up on auto CPAP 5-15 She recently had some trouble with dizziness.  Feels like it may be vertigo.  It happened yesterday and has not happened since.  She denied any palpitations, syncope, headaches, vision changes. She also tells me that she has been  struggling with shortness of breath over the past few months.  She had COVID in May and feels like her breathing has not been the greatest since.  She feels like she gets more winded with activity.  She does have an albuterol  inhaler which she will use occasionally.  Last time she used it was yesterday.  She does have good response to this.  She denies any significant cough, wheezing, chest congestion, lower extremity swelling.  No significant pulmonary  history.  No significant occupational or environmental exposures. PFTs ordered for further evaluation.   02/15/2022: Ov with Fabiano Ginley NP Patient presents today for follow up after being started on CPAP and having PFTs. She had normal pulmonary function testing completed today. Her breathing has improved since she was here last. She's been working on weight loss measures as well and is down 13 pounds. She rarely uses albuterol . No significant cough or chest congestion. She wears CPAP nightly. She's receiving good benefit from use. Feels like she's waking more rested and she is no longer waking throughout the night. Denies morning headaches or drowsy driving. Snoring has corrected with CPAP use. Settled down with full face mask. She still has some trouble with sleep onset. [facility] her lexapro  as she was having some symptoms of depression. She has only been on this a few weeks but feels like she's noticed some benefit. She will also use xanax  as needed for sleep onset. Would like to hold off on further pharmacological therapy at this time as she is trying to limit the medications she is on. She has used trazodone  in the past without any relief.   08/21/2022: OV with Tinleigh Whitmire for follow up. Since she was here last, she had knee surgery in May. She went to ED a few days after her surgery due to AMS. She had been taking gabapentin , oxycodone , lexapro  and xanax  at home post-operatively. She has since all of these but the gabapentin . Her last dose of oxycodone  was over a week ago. She has not had any further issues since. Feels like she is recovering well. She's still having a lot of trouble with her sleep, which has been an ongoing problem for her since our first visit. She was doing better with the lexapro  and occasional xanax  but she prefers not to take these now. She doesn't feel like her mood is any different; no depression or anxiety coming off of these. She just felt like  they helped her sleep better. She denies any sleep parasomnias/paralysis. No aberrant behavior.  She has been having trouble with her CPAP mask for the last month. She had a full face mask, which was working well, but then she got a new one and it kept leaking. They gave her a soft foam one, which seemed to do worse after she would clean it. She ordered another F20 mask, which she is going to try tonight. She denies any drowsy driving. She did feel better when she was on her CPAP.    10/08/2022: Ov with Baylee Mccorkel NP for follow up after being started on temazepam  for insomnia. She was unsuccessful with ambien  and lunesta . She had previously been taking ativan  and lexapro , which seemed to be helping with her sleep, but she stopped these in early June. She does feel like the temazepam  has helped at the 30 mg dose. Still has some night awakenings but symptoms are better. She does still struggle with anxiety, which is worse now that she's back to  worse. She hasn't talked to her PCP about this or restarting the lexapro  she was previously on. She denies any SI/HI, depression, or mood changes with temazepam . She is not wearing her CPAP every night but is trying to be more consistent. Tends to not wear it if she's having trouble sleeping. She denies any drowsy driving or morning headaches.  09/07/2022-10/06/2022 CPAP 5-15 cmH2O 18/30 days; 43% >4 hr; average use 4 hr 14 min Pressure 95th 9.7 Leaks 95th 0.9 AHI 0.8  06/19/2023: OV with Dr. Diania Fortes. SOB which led to visit with cardiologist. Positive d dimer prompting ED visit with negative CTA. She did have inflammation of the airways, concerning for bronchitis. Wheezing and SOB still. Uses albuterol  with minimal relief. Notes recent onset of voice loss. No GERD symptoms. Sleep is disrupted; not using her CPAP. SOB with minimal activity. Start ICS/LABA. Encouraged to resume CPAP.   07/04/2023: Today - acute Patient presents today for acute visit due to ongoing SOB, which she  feels is worse than her last visit. Still having intermittent wheezing. Getting lightheaded as well. She is also having coughing fits. She does feel albuterol  inhaler is helping. Finished z pack and prednisone . She is also having pitting edema to her ankles. She was advised to go to the ED but declined so was schedule for acute visit.  Her weight is up 6 lb since she was here 4/23. She's having pretty significant leg swelling that's progressively worsened. Still coughing. It's dry. Having some voice hoarseness, which is baseline for her but feels like it's a little worse with all the coughing. No fevers, chills, hemoptysis, chest pain. Still using Symbicort . No noticeable change with z pack and prednisone . She has an echocardiogram scheduled for 4/17. She does have palpitations from time to time.  Back on her CPAP. Still feeling fatigued during the day. No drowsy driving.   06/03/2023-07/02/2023: CPAP 5-15 cmH2O 7/30 days; 20% >4 hr; average use 5 hr 24 min Pressure 14.5 Leaks 3.3 AHI 2.5  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    Immunization History  Administered Date(s) Administered   Influenza Split 11/22/2014   Influenza, Seasonal, Injecte, Preservative Fre 12/24/2022   Influenza,inj,Quad PF,6+ Mos 11/12/2013, 12/16/2015, 11/21/2021   Influenza,inj,quad, With Preservative 10/27/2016   Influenza-Unspecified 11/29/2020   PFIZER(Purple Top)SARS-COV-2 Vaccination 03/19/2019, 04/08/2019, 01/08/2020   Tdap 01/10/2012, 07/12/2021   Zoster Recombinant(Shingrix) 02/01/2020, 04/07/2020    Past Medical History:  Diagnosis Date   Anxiety    Back pain    Bursitis    right shoulder   Chronic kidney disease    hematuria, has been worked up, her normal   Chronic leukopenia    intermittant since 2010, followed by Dr. Liane Redman now   Chronic neck pain    Constipation    Degenerative joint disease of low back  09/02/2015   Dr. Rossie Coon   Displacement of lumbar intervertebral disc    Dysrhythmia    Tachycardia, PVCs, PACs   Edema of both lower legs    Fibrocystic breast    Gallbladder problem    GERD (gastroesophageal reflux disease)    Headache    per pt more stress/tension   Heart palpitations    SEE EPIC ENCOUTNER , CARDIOLOGY DR. Sophia Dustman TURNER 2018; reports on 05-16-17 " i haven't had any bouts of those lately"     Hemorrhoids    Hidradenitis suppurativa    History of Clostridium  difficile infection 12/2010   History of exercise intolerance    ETT on 04-27-2016-- negative ([facility] treadmill score 7)   History of Helicobacter pylori infection 2001 and 09/ 2012   HSV-2 infection    genital   Hx of adenomatous colonic polyps 10/17/2007   Hydradenitis    per pt currently treated with Humira  injection   Hypertension    Hyperthyroidism    Hypocupremia    Hypothyroidism    Hypothyroidism, postsurgical 1980   IBS (irritable bowel syndrome)    Insomnia    Iron deficiency    Joint pain    Leukopenia    Lumbosacral radiculopathy    Lymphocytic colitis 05/14/2022   dx from colonoscopy   Medial meniscus tear    right knee   Mild obstructive sleep apnea    Mitral regurgitation    Mild to moderate by echo 09/2021   Numbness and tingling of right arm    Obesity    Osteoarthritis    Palpitations    Pernicious anemia    b12 def   Peroneal neuropathy    PONV (postoperative nausea and vomiting)    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   Prediabetes    Reactive hypoglycemia followed by pcp   post gastrectomy dumping syndrome   SOB (shortness of breath)    Spinal stenosis    Tendonitis    right shoulder   Varicose veins    Vertigo    Vitamin B 12 deficiency    Vitamin D  deficiency     Tobacco History: Social History   Tobacco Use  Smoking Status Never  Smokeless Tobacco Never   Counseling given: Not Answered   Outpatient Medications Prior to Visit  Medication Sig  Dispense Refill   albuterol  (VENTOLIN  HFA) 108 (90 Base) MCG/ACT inhaler Inhale 2 puffs into the lungs every 4 hours as needed for wheezing or shortness of breath. 18 g 3   budesonide  (ENTOCORT EC ) 3 MG 24 hr capsule Take 3 capsules (9 mg total) by mouth in the morning. 270 capsule 3   budesonide -formoterol  (SYMBICORT ) 160-4.5 MCG/ACT inhaler Inhale 2 puffs into the lungs 2 (two) times daily. 1 each 12   buPROPion  (WELLBUTRIN  XL) 150 MG 24 hr tablet TAKE 1 TABLET BY MOUTH EVERY DAY 90 tablet 1   cyanocobalamin  (DODEX ) 1000 MCG/ML injection INJECT 1 ML INTRAMUSCULARLY EVERY MONTH 3 mL 11   estradiol  (ESTRACE  VAGINAL) 0.1 MG/GM vaginal cream Place 1 g vaginally 3 (three) times a week. 42.5 g 3   estradiol  (VIVELLE -DOT) 0.1 MG/24HR patch Place 1 patch (0.1 mg total) onto the skin 2 (two) times a week. 8 patch 12   flecainide  (TAMBOCOR ) 50 MG tablet TAKE 1 TABLET EVERY DAY AT 6 AM 90 tablet 3   gabapentin  (NEURONTIN ) 600 MG tablet Take 1 tablet (600 mg total) by mouth 3 (three) times daily. 90 tablet 5   HYRIMOZ  40 MG/0.4ML SOAJ      levothyroxine  (SYNTHROID ) 112 MCG tablet TAKE 1 TABLET BY MOUTH EVERY DAY 30 tablet 2   levothyroxine  (SYNTHROID ) 112 MCG tablet Take 1 tablet (112 mcg total) by mouth daily. 90 tablet 1   LORazepam  (ATIVAN ) 1 MG tablet TAKE 1 TABLET BY MOUTH TWICE A DAY FOR ANXIETY 60 tablet 3   metoprolol  succinate (TOPROL -XL) 25 MG 24 hr tablet TAKE 1 TABLET BY MOUTH EVERY DAY 90 tablet 3   Olopatadine  HCl 0.2 % SOLN Apply 1 drop to eye daily as needed. (Patient taking differently: Apply 1 drop  to eye daily as needed (allergies).) 2.5 mL 5   predniSONE  (DELTASONE ) 10 MG tablet Take 4 tablets (40 mg total) by mouth daily with breakfast. 20 tablet 0   promethazine  (PHENERGAN ) 25 MG tablet Take 1 tablet (25 mg total) by mouth every 8 (eight) hours as needed for nausea and/or vomiting 30 tablet 5   tiZANidine  (ZANAFLEX ) 4 MG tablet Take 1 tablet (4 mg total) by mouth 3 (three) times daily.  90 tablet 3   Ubrogepant  (UBRELVY ) 100 MG TABS Take 1 tablet (100 mg total) by mouth every 2 (two) hours as needed. Maximum 200mg  a day. 16 tablet 11   Vitamin D , Ergocalciferol , (DRISDOL ) 1.25 MG (50000 UNIT) CAPS capsule TAKE 1 CAPSULE BY MOUTH ONCE A WEEK 4 capsule 2   HYDROcodone -acetaminophen  (NORCO/VICODIN) 5-325 MG tablet Take 1 tablet by mouth every 6 (six) hours as needed for severe pain. 12 tablet 0   Carbinoxamine  Maleate 4 MG TABS Take 1-2 tablets at night as needed for drainage and allergies. May cause drowsiness. (Patient not taking: Reported on 06/21/2023) 28 tablet 5   ipratropium (ATROVENT ) 0.03 % nasal spray Use 1-2 sprays in each nostril up to 3 times daily as needed for runny nose. Can take 15 minutes prior to meals. (Patient not taking: Reported on 06/21/2023) 30 mL 3   azithromycin  (ZITHROMAX ) 250 MG tablet Take as directed (Patient not taking: Reported on 07/04/2023) 6 tablet 0   doxepin  (SINEQUAN ) 50 MG capsule Take 1 capsule (50 mg total) by mouth at bedtime. (Patient not taking: Reported on 07/04/2023) 90 capsule 0   escitalopram  (LEXAPRO ) 10 MG tablet TAKE 1 TABLET BY MOUTH EVERY DAY (Patient not taking: Reported on 07/04/2023) 90 tablet 1   Facility-Administered Medications Prior to Visit  Medication Dose Route Frequency Provider Last Rate Last Admin   gadobenate dimeglumine  (MULTIHANCE ) injection 20 mL  20 mL Intravenous Once PRN Glory Larsen, MD         Review of Systems:   Constitutional: No night sweats, fevers, chills, or lassitude. +weight gain, fatigue (improving) HEENT: No headaches, difficulty swallowing, tooth/dental problems, or sore throat. No sneezing, itching, ear ache. +nasal congestion (chronic); hoarseness  CV:  +swelling in lower extremities; palpitations. No chest pain, orthopnea, PND, anasarca, syncope Resp: +shortness of breath with exertion; cough. No excess mucus or change in color of mucus. Not no hemoptysis. No wheezing.  No chest wall  deformity GI:  No heartburn, indigestion, abdominal pain, nausea, vomiting, diarrhea, change in bowel habits, loss of appetite, bloody stools.  GU: No dysuria, change in color of urine, urgency or frequency, nocturia.  No flank pain, no hematuria  Skin: No rash, lesions, ulcerations Neuro: No dizziness. No gait abnormalities. No memory impairment.  Psych: No depression or anxiety. Mood stable. +sleep disturbance     Physical Exam:  BP 124/82 (BP Location: Left Arm, Patient Position: Sitting, Cuff Size: Large)   Pulse 75   Ht 5\' 7"  (1.702 m)   Wt (!) 305 lb 3.2 oz (138.4 kg)   SpO2 99%   BMI 47.80 kg/m   GEN: Pleasant, interactive, well-kempt; morbidly obese; in no acute distress. HEENT:  Normocephalic and atraumatic. PERRLA. Sclera white. Nasal turbinates pink, moist and patent bilaterally. No rhinorrhea present. Oropharynx pink and moist, without exudate or edema. No lesions, ulcerations, or postnasal drip. Mallampati II NECK:  Supple w/ fair ROM.  CV: RRR, no m/r/g, +1-2 pitting BLE edema. Pulses intact, +2 bilaterally. No cyanosis, pallor or clubbing. PULMONARY:  Unlabored,  regular breathing. Bibasilar crackles otherwise clear bilaterally A&P w/o wheezes/rales/rhonchi. No accessory muscle use. No dullness to percussion. GI: BS present and normoactive. Soft, non-tender to palpation.  MSK: No erythema, warmth or tenderness.  Neuro: A/Ox3. No focal deficits noted.   Skin: Warm, no lesions or rashe Psych: Normal affect and behavior. Judgement and thought content appropriate.     Lab Results:  CBC    Component Value Date/Time   WBC 3.3 (L) 06/11/2023 0902   WBC 4.7 10/09/2022 0436   RBC 3.89 06/11/2023 0902   RBC 3.85 (L) 10/09/2022 0436   HGB 12.5 06/11/2023 0902   HGB 12.6 07/09/2016 1529   HCT 38.6 06/11/2023 0902   HCT 38.2 07/09/2016 1529   PLT 201 06/11/2023 0902   MCV 99 (H) 06/11/2023 0902   MCV 96.9 07/09/2016 1529   MCH 32.1 06/11/2023 0902   MCH 31.2  10/09/2022 0436   MCHC 32.4 06/11/2023 0902   MCHC 31.8 10/09/2022 0436   RDW 13.1 06/11/2023 0902   RDW 13.5 07/09/2016 1529   LYMPHSABS 2.2 10/09/2022 0436   LYMPHSABS 1.3 09/03/2022 1425   LYMPHSABS 1.6 07/09/2016 1529   MONOABS 0.4 10/09/2022 0436   MONOABS 0.3 07/09/2016 1529   EOSABS 0.1 10/09/2022 0436   EOSABS 0.1 09/03/2022 1425   BASOSABS 0.0 10/09/2022 0436   BASOSABS 0.0 09/03/2022 1425   BASOSABS 0.0 07/09/2016 1529    BMET    Component Value Date/Time   NA 142 06/12/2023 1810   NA 144 09/03/2022 1425   K 3.6 06/12/2023 1810   CL 106 06/12/2023 1810   CO2 25 06/12/2023 1810   GLUCOSE 79 06/12/2023 1810   BUN 13 06/12/2023 1810   BUN 9 09/03/2022 1425   CREATININE 1.21 (H) 06/12/2023 1810   CREATININE 1.23 (H) 07/09/2022 1132   CALCIUM  9.2 06/12/2023 1810   GFRNONAA 51 (L) 06/12/2023 1810   GFRNONAA 93 06/30/2020 1105   GFRAA 108 06/30/2020 1105    BNP No results found for: "BNP"   Imaging:  DG Chest 2 View Result Date: 07/04/2023 CLINICAL DATA:  SOB, volume overload EXAM: CHEST - 2 VIEW COMPARISON:  August 01, 2021 FINDINGS: Low lung volumes. No focal airspace consolidation, pleural effusion, or pneumothorax. No cardiomegaly. No acute fracture or destructive lesion. Multilevel degenerative disc disease of the spine. Cholecystectomy clips. IMPRESSION: Low lung volumes.  Otherwise, no acute cardiopulmonary abnormality. Electronically Signed   By: Rance Burrows M.D.   On: 07/04/2023 13:02   CT Angio Chest PE W/Cm &/Or Wo Cm Result Date: 06/12/2023 CLINICAL DATA:  Low to intermediate probability pulmonary embolism, positive D-dimer EXAM: CT ANGIOGRAPHY CHEST WITH CONTRAST TECHNIQUE: Multidetector CT imaging of the chest was performed using the standard protocol during bolus administration of intravenous contrast. Multiplanar CT image reconstructions and MIPs were obtained to evaluate the vascular anatomy. RADIATION DOSE REDUCTION: This exam was performed according  to the departmental dose-optimization program which includes automated exposure control, adjustment of the mA and/or kV according to patient size and/or use of iterative reconstruction technique. CONTRAST:  75mL OMNIPAQUE  IOHEXOL  350 MG/ML SOLN COMPARISON:  None Available. FINDINGS: Cardiovascular: Adequate opacification of the pulmonary arterial tree. No intraluminal filling defect identified to suggest acute pulmonary embolism. Central pulmonary arteries are of normal caliber. No significant coronary artery calcification. Cardiac size within normal limits. No pericardial effusion. No significant atherosclerotic calcification within the thoracic aorta. No aortic aneurysm. Mediastinum/Nodes: No enlarged mediastinal, hilar, or axillary lymph nodes. Thyroid  gland, trachea, and esophagus demonstrate no  significant findings. Lungs/Pleura: Mild elevation of the left hemidiaphragm. Mild bibasilar atelectasis. Lungs are otherwise clear. No pneumothorax or pleural effusion. 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 Mild bronchial wall thickening present in keeping with airway inflammation. Upper Abdomen: No acute abnormality. 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 No acute bone abnormality. No suspicious lytic or blastic bone lesions. Review of the MIP images confirms the above findings. IMPRESSION: 1. No acute pulmonary embolism. 2. Mild bronchial wall thickening in keeping with airway inflammation. 3. 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 4. Mild elevation of the left hemidiaphragm. 5. 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 Electronically Signed   By: Worthy Heads M.D.   On: 06/12/2023 22:21   VAS US  LOWER EXTREMITY VENOUS (DVT) (7a-7p) Result Date:  06/12/2023  Lower Venous DVT Study Patient Name:  [facility] Carlotta Chew  Date of Exam:   06/12/2023 Medical Rec #: [id number]                  Accession #:    [id number] Date of Birth: 05/07/1960                 Patient Gender: F Patient Age:   52 years Exam Location:  [facility] Procedure:      VAS US  LOWER EXTREMITY VENOUS (DVT) Referring Phys: Geralyn Knee ZACKOWSKI --------------------------------------------------------------------------------  Indications: SOB, and Pain.  Limitations: Body habitus. Comparison Study: 07/18/22 - Negative right lower extremity Performing Technologist: Franky Ivanoff Sturdivant-Jones RDMS, RVT  Examination Guidelines: A complete evaluation includes B-mode imaging, spectral Doppler, color Doppler, and power Doppler as needed of all accessible portions of each vessel. Bilateral testing is considered an integral part of a complete examination. Limited examinations for reoccurring indications may be performed as noted. The reflux portion of the exam is performed with the patient in reverse Trendelenburg.  +---------+---------------+---------+-----------+----------+--------------+ RIGHT    CompressibilityPhasicitySpontaneityPropertiesThrombus Aging +---------+---------------+---------+-----------+----------+--------------+ CFV      Full           Yes      Yes                                 +---------+---------------+---------+-----------+----------+--------------+ SFJ      Full                                                        +---------+---------------+---------+-----------+----------+--------------+ FV Prox  Full                                                        +---------+---------------+---------+-----------+----------+--------------+ FV Mid   Full                                                        +---------+---------------+---------+-----------+----------+--------------+ FV DistalFull                                                         +---------+---------------+---------+-----------+----------+--------------+  PFV      Full                                                        +---------+---------------+---------+-----------+----------+--------------+ POP      Full           Yes      Yes                                 +---------+---------------+---------+-----------+----------+--------------+ PTV      Full                                                        +---------+---------------+---------+-----------+----------+--------------+ PERO     Full                                                        +---------+---------------+---------+-----------+----------+--------------+   +---------+---------------+---------+-----------+----------+--------------+ LEFT     CompressibilityPhasicitySpontaneityPropertiesThrombus Aging +---------+---------------+---------+-----------+----------+--------------+ CFV      Full           Yes      Yes                                 +---------+---------------+---------+-----------+----------+--------------+ SFJ      Full                                                        +---------+---------------+---------+-----------+----------+--------------+ FV Prox  Full                                                        +---------+---------------+---------+-----------+----------+--------------+ FV Mid   Full                                                        +---------+---------------+---------+-----------+----------+--------------+ FV DistalFull                                                        +---------+---------------+---------+-----------+----------+--------------+ PFV      Full                                                        +---------+---------------+---------+-----------+----------+--------------+  POP      Full           Yes      Yes                                  +---------+---------------+---------+-----------+----------+--------------+ PTV      Full                                                        +---------+---------------+---------+-----------+----------+--------------+ PERO     Full                                                        +---------+---------------+---------+-----------+----------+--------------+     Summary: BILATERAL: - No evidence of deep vein thrombosis seen in the lower extremities, bilaterally. -No evidence of popliteal cyst, bilaterally.   *See table(s) above for measurements and observations. Electronically signed by Irvin Mantel on 06/12/2023 at 7:55:37 PM.    Final     Administration History     None          Latest Ref Rng & Units 02/15/2022    9:05 AM  PFT Results  FVC-Pre L 3.01   FVC-Predicted Pre % 82   FVC-Post L 2.99   FVC-Predicted Post % 81   Pre FEV1/FVC % % 86   Post FEV1/FCV % % 86   FEV1-Pre L 2.59   FEV1-Predicted Pre % 91   FEV1-Post L 2.58   DLCO uncorrected ml/min/mmHg 20.51   DLCO UNC% % 92   DLCO corrected ml/min/mmHg 20.91   DLCO COR %Predicted % 93   DLVA Predicted % 109   TLC L 5.02   TLC % Predicted % 91   RV % Predicted % 90     No results found for: "NITRICOXIDE"      Assessment & Plan:   Shortness of breath Ongoing DOE with progressive worsening and evidence of volume overload. Concern for cardiac component given constellation of symptoms and failure to improve with prednisone  and z pack. Some mild response to SABA. Will recheck BNP and CXR today. Plan to treat with furosemide  40 mg x 3 days and reassess response. Will touch base with Dr. Micael Adas to see about getting echo moved up. Walk test without hypoxia on room air.   Patient Instructions  Continue Albuterol  inhaler 2 puffs every 6 hours as needed for shortness of breath or wheezing. Notify if symptoms persist despite rescue inhaler/neb use.  Continue Symbicort  2 puffs Twice daily for now. Brush tongue  and rinse mouth afterwards  Start furosemide  40 mg daily for 3 days. Take in AM. May add on potassium depending on your current levels; I'll call you about this. If you develop facial swelling or tongue swelling, go to the ED. Monitor your blood pressure while taking this   Labs and x ray today  I'll reach out to Dr. Micael Adas to see if we can get your echocardiogram moved up  Hycodan cough syrup 5 mL every 8 hours as needed for cough. Do not drive after taking or take with other sedating medications Benzonatate  1 capsule  Three times a day for cough Flonase  nasal spray 2 sprays each nostril daily until cough improves to target potential postnasal drainage  Keep your appointment with Dr. Liane Redman to review your labs and adjust your Synthroid  if needed  Follow up in 7-10 days with Dr. Diania Fortes or Gina Lagos. Ok to double book KC if no availability on day not already DB with Dr. Diania Fortes. If symptoms do not improve or worsen, please contact office for sooner follow up or seek emergency care.    Upper airway cough syndrome Persistent cough despite prednisone  taper and z pack. FeNO normal, no significant airway inflammation. See above. Cough control regimen. Target sinus symptoms with intranasal steroid and reassess response.   Secondary hyperparathyroidism (HCC) Possibly contributing to symptoms. Her PCP, Dr. Liane Redman, has ordered labs. Will collect today so pt does not have to have two lab draws. Plan to route to Dr. Liane Redman to discuss at her follow up tomorrow.     I spent 45 minutes of dedicated to the care of this patient on the date of this encounter to include pre-visit review of records, face-to-face time with the patient discussing conditions above, post visit ordering of testing, clinical documentation with the electronic health record, making appropriate referrals as documented, and communicating necessary findings to members of the patients care team.  Roetta Clarke, NP 07/04/2023  Pt  aware and understands NP's role.

## 2023-07-04 NOTE — Assessment & Plan Note (Addendum)
 Persistent cough despite prednisone  taper and z pack. FeNO normal, no significant airway inflammation. See above. Cough control regimen. Target sinus symptoms with intranasal steroid and reassess response.

## 2023-07-04 NOTE — Patient Instructions (Signed)
 Below is our plan:  We will continue Ubrelvy  as needed. Please take 1 tablet at onset of headache. May take 1 additional tablet in 2 hours if needed. Do not take more than 2 tablets in 24 hours or more than 10 in a month.   You could look at Emgality .com and see if the savings card could reduce cost of Emgality  should we need to restart. You could also call your payer to see if Ajovy or Amovig is covered any better. Reach out if you need me!  Please make sure you are staying well hydrated. I recommend 50-60 ounces daily. Well balanced diet and regular exercise encouraged. Consistent sleep schedule with 6-8 hours recommended.   Please continue follow up with care team as directed.   Follow up with me in 1 year   You may receive a survey regarding today's visit. I encourage you to leave honest feed back as I do use this information to improve patient care. Thank you for seeing me today!   GENERAL HEADACHE INFORMATION:   Natural supplements: Magnesium  Oxide or Magnesium  Glycinate 500 mg at bed (up to 800 mg daily) Coenzyme Q10 300 mg in AM Vitamin B2- 200 mg twice a day   Add 1 supplement at a time since even natural supplements can have undesirable side effects. You can sometimes buy supplements cheaper (especially Coenzyme Q10) at www.WebmailGuide.co.za or at [facility].  Migraine with aura: There is increased risk for stroke in women with migraine with aura and a contraindication for the combined contraceptive pill for use by women who have migraine with aura. The risk for women with migraine without aura is lower. However other risk factors like smoking are far more likely to increase stroke risk than migraine. There is a recommendation for no smoking and for the use of OCPs without estrogen such as progestogen only pills particularly for women with migraine with aura.Aaron Aas People who have migraine headaches with auras may be 3 times more likely to have a stroke caused by a blood clot, compared to migraine  patients who don't see auras. Women who take hormone-replacement therapy may be 30 percent more likely to suffer a clot-based stroke than women not taking medication containing estrogen. Other risk factors like smoking and high blood pressure may be  much more important.    Vitamins and herbs that show potential:   Magnesium : Magnesium  (250 mg twice a day or 500 mg at bed) has a relaxant effect on smooth muscles such as blood vessels. Individuals suffering from frequent or daily headache usually have low magnesium  levels which can be increase with daily supplementation of 400-750 mg. Three trials found 40-90% average headache reduction  when used as a preventative. Magnesium  may help with headaches are aura, the best evidence for magnesium  is for migraine with aura is its thought to stop the cortical spreading depression we believe is the pathophysiology of migraine aura.Magnesium  also demonstrated the benefit in menstrually related migraine.  Magnesium  is part of the messenger system in the serotonin cascade and it is a good muscle relaxant.  It is also useful for constipation which can be a side effect of other medications used to treat migraine. Good sources include nuts, whole grains, and tomatoes. Side Effects: loose stool/diarrhea  Riboflavin (vitamin B 2) 200 mg twice a day. This vitamin assists nerve cells in the production of ATP a principal energy storing molecule.  It is necessary for many chemical reactions in the body.  There have been at least 3  clinical trials of riboflavin using 400 mg per day all of which suggested that migraine frequency can be decreased.  All 3 trials showed significant improvement in over half of migraine sufferers.  The supplement is found in bread, cereal, milk, meat, and poultry.  Most Americans get more riboflavin than the recommended daily allowance, however riboflavin deficiency is not necessary for the supplements to help prevent headache. Side effects: energizing,  green urine   Coenzyme Q10: This is present in almost all cells in the body and is critical component for the conversion of energy.  Recent studies have shown that a nutritional supplement of CoQ10 can reduce the frequency of migraine attacks by improving the energy production of cells as with riboflavin.  Doses of 150 mg twice a day have been shown to be effective.   Melatonin: Increasing evidence shows correlation between melatonin secretion and headache conditions.  Melatonin supplementation has decreased headache intensity and duration.  It is widely used as a sleep aid.  Sleep is natures way of dealing with migraine.  A dose of 3 mg is recommended to start for headaches including cluster headache. Higher doses up to 15 mg has been reviewed for use in Cluster headache and have been used. The rationale behind using melatonin for cluster is that many theories regarding the cause of Cluster headache center around the disruption of the normal circadian rhythm in the brain.  This helps restore the normal circadian rhythm.   HEADACHE DIET: Foods and beverages which may trigger migraine Note that only 20% of headache patients are food sensitive. You will know if you are food sensitive if you get a headache consistently 20 minutes to 2 hours after eating a certain food. Only cut out a food if it causes headaches, otherwise you might remove foods you enjoy! What matters most for diet is to eat a well balanced healthy diet full of vegetables and low fat protein, and to not miss meals.   Chocolate, other sweets ALL cheeses except cottage and cream cheese Dairy products, yogurt, sour cream, ice cream Liver Meat extracts (Bovril, Marmite, meat tenderizers) Meats or fish which have undergone aging, fermenting, pickling or smoking. These include: Hotdogs,salami,Lox,sausage, mortadellas,smoked salmon, pepperoni, Pickled herring Pods of broad bean (English beans, Chinese pea pods, [place] (fava) beans, lima and  navy beans Ripe avocado, ripe banana Yeast extracts or active yeast preparations such as Brewer's or Fleishman's (commercial bakes goods are permitted) Tomato based foods, pizza (lasagna, etc.)   MSG (monosodium glutamate) is disguised as many things; look for these common aliases: Monopotassium glutamate Autolysed yeast Hydrolysed protein Sodium caseinate "flavorings" "all natural preservatives" Nutrasweet   Avoid all other foods that convincingly provoke headaches.   Resources: The Dizzy Althia Jetty Your Headache Diet, migrainestrong.com  https://zamora-andrews.com/   Caffeine and Migraine For patients that have migraine, caffeine intake more than 3 days per week can lead to dependency and increased migraine frequency. I would recommend cutting back on your caffeine intake as best you can. The recommended amount of caffeine is 200-300 mg daily, although migraine patients may experience dependency at even lower doses. While you may notice an increase in headache temporarily, cutting back will be helpful for headaches in the long run. For more information on caffeine and migraine, visit: https://americanmigrainefoundation.org/resource-library/caffeine-and-migraine/   Headache Prevention Strategies:   1. Maintain a headache diary; learn to identify and avoid triggers.  - This can be a simple note where you log when you had a headache, associated symptoms, and medications used -  There are several smartphone apps developed to help track migraines: Migraine Buddy, Migraine Monitor, Curelator N1-Headache App   Common triggers include: Emotional triggers: Emotional/Upset family or friends Emotional/Upset occupation Business reversal/success Anticipation anxiety Crisis-serious Post-crisis periodNew job/position   Physical triggers: Vacation Day Weekend Strenuous Exercise High Altitude Location New Move Menstrual Day Physical  Illness Oversleep/Not enough sleep Weather changes Light: Photophobia or light sesnitivity treatment involves a balance between desensitization and reduction in overly strong input. Use dark polarized glasses outside, but not inside. Avoid bright or fluorescent light, but do not dim environment to the point that going into a normally lit room hurts. Consider FL-41 tint lenses, which reduce the most irritating wavelengths without blocking too much light.  These can be obtained at axonoptics.com or theraspecs.com Foods: see list above.   2. Limit use of acute treatments (over-the-counter medications, triptans, etc.) to no more than 2 days per week or 10 days per month to prevent medication overuse headache (rebound headache).     3. Follow a regular schedule (including weekends and holidays): Don't skip meals. Eat a balanced diet. 8 hours of sleep nightly. Minimize stress. Exercise 30 minutes per day. Being overweight is associated with a 5 times increased risk of chronic migraine. Keep well hydrated and drink 6-8 glasses of water  per day.   4. Initiate non-pharmacologic measures at the earliest onset of your headache. Rest and quiet environment. Relax and reduce stress. Breathe2Relax is a free app that can instruct you on    some simple relaxtion and breathing techniques. Http://Dawnbuse.com is a    free website that provides teaching videos on relaxation.  Also, there are  many apps that   can be downloaded for "mindful" relaxation.  An app called YOGA NIDRA will help walk you through mindfulness. Another app called Calm can be downloaded to give you a structured mindfulness guide with daily reminders and skill development. Headspace for guided meditation Mindfulness Based Stress Reduction Online Course: www.palousemindfulness.com Cold compresses.   5. Don't wait!! Take the maximum allowable dosage of prescribed medication at the first sign of migraine.   6. Compliance:  Take prescribed  medication regularly as directed and at the first sign of a migraine.   7. Communicate:  Call your physician when problems arise, especially if your headaches change, increase in frequency/severity, or become associated with neurological symptoms (weakness, numbness, slurred speech, etc.). Proceed to emergency room if you experience new or worsening symptoms or symptoms do not resolve, if you have new neurologic symptoms or if headache is severe, or for any concerning symptom.   8. Headache/pain management therapies: Consider various complementary methods, including medication, behavioral therapy, psychological counselling, biofeedback, massage therapy, acupuncture, dry needling, and other modalities.  Such measures may reduce the need for medications. Counseling for pain management, where patients learn to function and ignore/minimize their pain, seems to work very well.   9. Recommend changing family's attention and focus away from patient's headaches. Instead, emphasize daily activities. If first question of day is 'How are your headaches/Do you have a headache today?', then patient will constantly think about headaches, thus making them worse. Goal is to re-direct attention away from headaches, toward daily activities and other distractions.   10. Helpful Websites: www.AmericanHeadacheSociety.org PatentHood.ch www.headaches.org TightMarket.nl www.achenet.org

## 2023-07-04 NOTE — Progress Notes (Signed)
 PATIENT: Victoria [name] DOB: Aug 04, 1960  REASON FOR VISIT: follow up HISTORY FROM: patient  Virtual Visit via MyChart video  I connected with Victoria [name] on 07/09/23 at  8:30 AM EDT via MyChart video and verified that I am speaking with the correct person using two identifiers.   I discussed the limitations, risks, security and privacy concerns of performing an evaluation and management service by Mychart video and the availability of in person appointments. I also discussed with the patient that there may be a patient responsible charge related to this service. The patient expressed understanding and agreed to proceed.   History of Present Illness:  07/09/23 ALL: Kerilyn Betti is a 63 y.o. female here today for follow up for migraines. She was last seen by Dr Tresia Fruit 06/2022 and doing well on Emgality  and Ubrelvy . Since, she reports having to stop Emgality  11/2022 after retirement. New insurance doesn't cover Emgality  very well. She is averaging about 4-6 migraine days a month. Ubrelvy  usually aborts headache but she needs to take two doses. She feels she is doing well at this time.    History (copied from Dr Harding Li previous note)  07/02/2022: She was placed on Emgality  but having problems getting it finally it was cleared up just needed a new copay card and last injection last month, waiting on this month, she has had it every mont and she feels like since January she has been doing well. Migraines are at least >>50% improvement in frequency and severity feels like she is doing very well. Ubrelvy  helps when she does have it. Prior to emgality  having at least 12-15 migraines a month and most of the month of headaches. And now 4 migraines a month with the emgality  and Less than 6 total headache days a month and ubrelvy  works.    Observations/Objective:  Generalized: Well developed, in no acute distress  Mentation: Alert oriented to time, place, history  taking. Follows all commands speech and language fluent   Assessment and Plan:  63 y.o. 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), Displacement of lumbar intervertebral disc, Dysrhythmia, Edema of both lower legs, Fibrocystic breast, Gallbladder problem, GERD (gastroesophageal reflux disease), Headache, Heart palpitations, Hemorrhoids, Hidradenitis suppurativa, History of Clostridium difficile infection (12/2010), History of exercise intolerance, History of Helicobacter pylori infection (2001 and 09/ 2012), HSV-2 infection, adenomatous colonic polyps (10/17/2007), Hydradenitis, Hypertension, Hyperthyroidism, Hypocupremia, Hypothyroidism, Hypothyroidism, postsurgical (1980), IBS (irritable bowel syndrome), Insomnia, Iron deficiency, Joint pain, Leukopenia, Lumbosacral radiculopathy, 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 here with    ICD-10-CM   1. Migraine without aura and without status migrainosus, not intractable  G43.009 Ubrogepant  (UBRELVY ) 100 MG TABS    DISCONTINUED: Ubrogepant  (UBRELVY ) 100 MG TABS     Janalee reports doing fairly well on abortive therapy alone. Emgality  not well covered with new payer. She will continue Ubrelvy  as needed. May restart Emgality  later if needed. I encouraged her to look for savings program on Emgality .com. She will continue healthy lifestyle habits. Follow  up with me in  1 year.    No orders of the defined types were placed in this encounter.   Meds ordered this encounter  Medications   DISCONTD: Ubrogepant  (UBRELVY ) 100 MG TABS    Sig: Take 1 tablet (100 mg total) by mouth every 2 (two) hours as needed. Maximum 200mg  a day.    Dispense:  16 tablet    Refill:  11    Supervising Provider:   AHERN, ANTONIA B [[id number]]   Ubrogepant  (UBRELVY ) 100 MG TABS    Sig: Take 1 tablet (100 mg total) by mouth every 2 (two) hours as needed. Maximum 200mg  a day.    Dispense:  16 tablet    Refill:  11    Supervising Provider:   Glory Larsen [[id number]]     Follow Up Instructions:  I discussed the assessment and treatment plan with the patient. The patient was provided an opportunity to ask questions and all were answered. The patient agreed with the plan and demonstrated an understanding of the instructions.   The patient was advised to call back or seek an in-person evaluation if the symptoms worsen or if the condition fails to improve as anticipated.  I provided 15 minutes of face-to-face and non face-to-face time during this MyChart video encounter. Patient located at their place of residence. Provider is in the office.    Maryellen Dowdle, NP

## 2023-07-04 NOTE — Progress Notes (Signed)
 Annual Wellness Visit   Patient Care Team: Victoria [name], Victoria Meyers, MD as PCP - General Victoria Matsu, MD as PCP - Cardiology (Cardiology) Victoria [name], RD as Dietitian (Family Medicine)  Visit Date: 07/08/23   Chief Complaint  Patient presents with   Annual Exam   Subjective:  Patient: Victoria [name], Female DOB: [birth year], 63 y.o. MRN: [id number] Victoria [name] is a 63 y.o. Female who presents today for her Annual Wellness Visit. Patient has Morbid Obesity; Backache; Post-Surgical Hypothyroidism; Gastroesophageal Reflux Disease; Vitamin-D Deficiency; Fibrocystic Disease of Breast; Cobalamin Deficiency; History of Clostridium Difficile Infection; Lap Gastric Bypass May 2016; Reactive Hypoglycemia; Postgastric Surgery Syndrome; Palpitations; Shortness Of Breath; Leukopenia; Hypocupremia; Insomnia; Pernicious Anemia; Failed Total Hip Arthroplasty; Failed Total Hip Arthroplasty, Sequela; Acute Meniscal Tear, Medial; OA (Osteoarthritis) of Knee; Iron Malabsorption; IDA (Iron Deficiency Anemia); Lumbosacral Radiculopathy; Peroneal Neuropathy at Knee, Left; History of Total Left Knee Replacement; Acute Medial Meniscal Tear, Right; Polyethylene Wear, Left Knee Joint Prosthesis; Status Post Arthroscopy, Right Knee; Arm Numbness; Chronic Neck Pain; Other Chronic Pain; Displacement of Lumbar Intervertebral Disc; Elevated Blood-Pressure Reading, w/o Diagnosis of Hypertension; Lumbar Spondylosis; Lumbar Stenosis w/ Neurogenic Claudication; Spinal Stenosis of Lumbar Region; Pain, Left Knee; Excessive Daytime Sleepiness; Mild Obstructive Sleep Apnea; Vertigo; SOBOE (Shortness Of Breath On Exertion); Other Fatigue; Secondary Hyperparathyroidism; Inadequate Fluid Intake; Inadequate Sleep Hygiene; History Of Roux-En-Y Gastric Bypass; Depressed Mood; Obstructive Sleep Apnea; Chronic Migraine w/o Aura w/o Status Migrainosus, Not Intractable; Chronic Rhinitis; Generalized Abdominal Pain; Chronic  Conjunctivitis, Both Eyes; Lymphocytic Colitis; Status Post Total Right Knee Replacement; Polypharmacy; Prediabetes; Abnormal Metabolism; Generalized Anxiety Disorder; and Upper Airway Cough Syndrome.    History of Palpitations; Non-sustained Atrial Tachycardia treated with Flecainide  50 mg daily, Metoprolol  succinate 25 mg daily, and started on Lasix  40 mg daily x3 days on 5/08 for Hypervolemia w/ Klor-Con  10 meq to prevent potassium deficiency BP normal today at 100/80. Followed by Dr. Gaylyn [name], Cardiologist, whom she saw on 06/11/2023, had a repeat Echo ordered for 5/19, CT cardiac perfusion, and a long-term monitor attached 4/19 - 5/02. D-Dimer elevated at 2.84 on 4/15 after seeing Dr. Micael [name], seen in ED on 4/16.   Says that she has been hoarse for a couple of months (according to ED note on 4/16 about 5-6  months), has had some SOB and coughing, but no expectoration or severe reflux. Saw Dr. Diania [name] on 4/30, who prescribed Symbicort  (which she is taking twice daily), Prednisone , and a Zpak. She says that her pulse ox doesn't drop during activities even when she is getting short of breath. When short of breath,she says she has to sit and use her inhaler. 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   Currently is significantly hoarse with sqeaky voice which is concerning. Reports this has been ongoing for "weeks". Have arranged for her to be seen by ENT physician this coming Friday.  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 It is tiring for her.   Has upcoming Echo and visit with Dr. Micael [name], Cardiologist. I am concerned she may have an underlying cardiomyopathy.  Had right TKA by Dr. Lucienne [name] May 2024. She retired as a Engineer, civil (consulting) for [employer].  Remote Hx of Vitamin D  deficiency, iron deficiency like due  to gastric bypass, migraine headaches, mild sleep apnea, chronic musculoskeletal pain, depression, fatigue.Had Covid-10  2023.   Feels DOE started after that.  Hx migraine headaches seen by Neurology.GYn issues with vaginal discharge.  Hx of coronary calcium  score of 0. Hx PACs and PVCs treated with Toprol  and Flecainide .B12 deficiency due to gastric Bypass and gives self B12 injections.  Defer Pneumococcal vaccine until feeling better.  History of Hyperlipidemia with  07/04/2023 Lipid Panel, compared to 06/2022: Cholesterol 220, elevated from 197; LDL 116, elevated from 99.  History of Reactive Hypoglycemia 2017; Impaired Glucose Tolerance with 07/04/2023 HgbA1c 6.0%, decreased from 6.1% in 11/2022, elevated from 5.7% in 06/2022; Glucose 75. .   S/p Thyroidectomy 1980; Hypothyroidism treated with Levothyroxine  112 mcg. 07/04/2023 TSH: 0.84.   History of Obesity; BMI 47.14, weight 301 pounds 07/08/2023, decreased from BMI 47.80, weight 305 pounds 3.2 oz on 07/04/2023. 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 Starting from 273 on 07/06/2014, she was 203 by the end of 2016. At the beginning of 2017 was 200 and weighed 199 by the end. 2018 started at 202 and end 201. In 2019 she started at 220 lbs and ended at 228.  In 2020, went from 245 to 244.  In 2021 went from 262 to 263 lbs, and then in 2022 went from 263 to 262 lbs.  In 2023, she started at 265 lbs and ended at 262 lbs. Was 277 lbs at the start of 2024 and was 297 lbs by the end.       History of Iron Deficiency; Benign Ethnic Neutropenia with 07/04/2023 CBC, compared to 07/02/2022: RBC 3.61, decreased from 3.73; Hgb 11.6, decreased form 12.2; HCT 35.6, decreased from 37.6; otherwise WNL - neutrophils at 62.2.    History of Vitamin-B12 Deficiency treated with Dodex  1 mL IM monthly. Vitamin-D Deficiency treated with Vitamin-D 50,000 units weekly.   History of Anxiety/Depression treated with Lorazepam  1 mg twice daily and  Wellbutrin -XL 150 mg daily.  History of Asthma treated with Albuterol  inhaler as needed.   History of Hidradenitis Suppurativa treated with Humira  40 mg injected weekly. No episodes in several years.  History of Musculoskeletal Pain; Chronic Back Pain treated with Gabapentin  600 mg three times daily and Tizanidine  4 mg three times daily. S/p Total Right Knee Arthroplasty by Dr. Lucienne [name] 07/13/2022; S/p Total Left Knee Arthroplasty by Dr. France Ina 01/20/2018.   History of Migraine Headaches w/ associated Vertigo treated with Emgality  120 mg monthly and Ubrelvy  100 mg as needed every two hours, maximum 200 mg. Followed by Dr. Aldona Amel. Also uses Phenergan  25 mg as needed for nausea.   Has developed CKD : Her creatinine in May 2024 was 1.08 . By August 2024 , creatinine was 1.43 . Creatinine was 1.21 in April 2025. Recent creatinine on May 8 was 1.23.   Creatinine levels were normal in 2023 and 2022.  Labs on 07/04/2023: Creatinine was 1.23  with eGFR 47 cc/min  D-dimer elevated 2.84 on April 15 but CT chest angio negative for PE. Mild bibasilar atelectasis BNP: 114    S/p Total Abdominal Hysterectomy 2004 for Fibroids, tubes and ovaries remain. Followed by Dr. Colvin Dec, Gynecologist. Using Estradiol  gel for Estrogen Replacement.   Overdue for Mammogram; last completed 05/29/2022  normal.  Colonoscopy 05/14/2022  removed one small sessile Polyp in mid-ascending colon (found to be a diminutive tubular adenoma per pathology); otherwise normal with repeat recommendation of 2031. History of Lymphocytic Colitis treated with Budesonide  9 mg daily. Followed by Dr. Tami Falcon.   Bone Density 03/24/2018  T-score Forearm Radius 1.5,  normal.   Vaccine Counseling: Due for Flu and Covid-19; UTD on Flu, Shingles 2/2, and Tdap. Past Medical History:  Diagnosis Date   Anxiety    Back pain    Bursitis    right shoulder   Chronic kidney disease    hematuria, has been worked up, her normal   Chronic  leukopenia    intermittant since 2010, followed by Dr. Liane Redman now   Chronic neck pain    Constipation    Degenerative joint disease of low back 09/02/2015   Dr. Rossie Coon   Displacement of lumbar intervertebral disc    Dysrhythmia    Tachycardia, PVCs, PACs   Edema of both lower legs    Fibrocystic breast    Gallbladder problem    GERD (gastroesophageal reflux disease)    Headache    per pt more stress/tension   Heart palpitations    SEE EPIC ENCOUTNER , CARDIOLOGY DR. Sophia Dustman TURNER 2018; reports on 05-16-17 " i haven't had any bouts of those lately"     Hemorrhoids    Hidradenitis suppurativa    History of Clostridium difficile infection 12/2010   History of exercise intolerance    ETT on 04-27-2016-- negative ([facility] treadmill score 7)   History of Helicobacter pylori infection 2001 and 09/ 2012   HSV-2 infection    genital   Hx of adenomatous colonic polyps 10/17/2007   Hydradenitis    per pt currently treated with Humira  injection   Hypertension    Hyperthyroidism    Hypocupremia    Hypothyroidism    Hypothyroidism, postsurgical 1980   IBS (irritable bowel syndrome)    Insomnia    Iron deficiency    Joint pain    Leukopenia    Lumbosacral radiculopathy    Lymphocytic colitis 05/14/2022   dx from colonoscopy   Medial meniscus tear    right knee   Mild obstructive sleep apnea    Mitral regurgitation    Mild to moderate by echo 09/2021   Numbness and tingling of right arm    Obesity    Osteoarthritis    Palpitations    Pernicious anemia    b12 def   Peroneal neuropathy    PONV (postoperative nausea and vomiting)    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   Prediabetes    Reactive hypoglycemia followed by pcp   post gastrectomy dumping syndrome   SOB (shortness of breath)    Spinal stenosis    Tendonitis    right shoulder   Varicose veins    Vertigo    Vitamin B 12 deficiency    Vitamin D  deficiency    Allergies  Allergen Reactions   Other Other (See  Comments)    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   Past Surgical History:  Procedure Laterality Date   BREATH TEK H PYLORI N/A 05/03/2014   Procedure: BREATH TEK H PYLORI;  Surgeon: Veronia Goon, MD;  Location: [facility];  Service: General;  Laterality: N/A;   CARDIOVASCULAR STRESS TEST  03/11/2013   dr Sophia Dustman turner   Low risk nuclear study w/ a very small anterior perfusion defect that most likely represents breast attenuation artifact, less likely this could represent a true perfusion abnormality in the distribution of diagonal branch of the LAD/  normal LV function and wall motion , ef 66%   CATARACT EXTRACTION W/ INTRAOCULAR LENS  IMPLANT, BILATERAL  10/2017   CESAREAN SECTION  1985   CHOLECYSTECTOMY OPEN  1987  COLONOSCOPY  02/2017   Dr Victoria [name] ; per patient they found 7 polyps with polypectomy; on 5 year track    ESOPHAGOGASTRODUODENOSCOPY  02/01/2021   FINE NEEDLE ASPIRATION Left 05/22/2017   Procedure: LEFT KNEE ASPIRATION;  Surgeon: Liliane Rei, MD;  Location: [facility];  Service: Orthopedics;  Laterality: Left;   GASTRIC ROUX-EN-Y N/A 07/06/2014   Procedure: LAPAROSCOPIC ROUX-EN-Y GASTRIC BYPASS, ENTEROLYSIS OF ADHESIONS WITH UPPER ENDOSCOPY;  Surgeon: Jacolyn Matar, MD;  Location: [facility];  Service: General;  Laterality: N/A;   HAMMER TOE SURGERY Bilateral 02/17/2008   bilateral foot digit's 2 and 5   HIATAL HERNIA REPAIR N/A 07/06/2014   Procedure: LAPAROSCOPIC REPAIR OF HIATAL HERNIA;  Surgeon: Jacolyn Matar, MD;  Location: [facility];  Service: General;  Laterality: N/A;   I & D KNEE WITH POLY EXCHANGE Left 12/11/2019   Procedure: LEFT KNEE POLY-LINER EXCHANGE;  Surgeon: Arnie Lao, MD;  Location: [facility];  Service: Orthopedics;  Laterality: Left;   KNEE ARTHROSCOPY Right 12/11/2019   Procedure: RIGHT KNEE ARTHROSCOPY WITH PARTIAL MEDIAL MENISCECTOMY;  Surgeon: Arnie Lao, MD;  Location: [facility];  Service: Orthopedics;  Laterality:  Right;   KNEE ARTHROSCOPY WITH MEDIAL MENISECTOMY Left 07/17/2017   Procedure: LEFT KNEE ARTHROSCOPY WITH MEDIAL LATERAL MENISECTOMY;  Surgeon: Liliane Rei, MD;  Location: [facility];  Service: Orthopedics;  Laterality: Left;   MASS EXCISION N/A 10/11/2016   Procedure: EXCISION OF PERIANAL MASS;  Surgeon: Joyce Nixon, MD;  Location[facility];  Service: General;  Laterality: N/A;   PARTIAL HYSTERECTOMY  2006   has ovaries   REFRACTIVE SURGERY     THYROIDECTOMY  1980   TOTAL ABDOMINAL HYSTERECTOMY  08-25-2002   dr Katherin Pam   ovaries retained   TOTAL HIP ARTHROPLASTY Right 05/21/2012   Procedure: TOTAL HIP ARTHROPLASTY;  Surgeon: Aurther Blue, MD;  Location: [facility];  Service: Orthopedics;  Laterality: Right;   TOTAL HIP ARTHROPLASTY Left 01-31-2009  dr France Ina   TOTAL HIP REVISION Left 05/22/2017   Procedure: Left hip bearing surface and head revision;  Surgeon: Liliane Rei, MD;  Location: [facility];  Service: Orthopedics;  Laterality: Left;   TOTAL KNEE ARTHROPLASTY Left 01/20/2018   Procedure: LEFT TOTAL KNEE ARTHROPLASTY;  Surgeon: Liliane Rei, MD;  Location: [facility];  Service: Orthopedics;  Laterality: Left;    TOTAL KNEE ARTHROPLASTY Right 07/13/2022   Procedure: RIGHTTOTAL KNEE ARTHROPLASTY;  Surgeon: Arnie Lao, MD;  Location: [facility];  Service: Orthopedics;  Laterality: Right;   TRANSTHORACIC ECHOCARDIOGRAM  05/03/2016   ef 55-60%/  trivial MR/  mild TR   TUBAL LIGATION     Family History  Problem Relation Age of Onset   High blood pressure Mother    Hypertension Mother    Headache Mother    High Cholesterol Mother    Thyroid  disease Mother    Obesity Mother    Kidney disease Mother    Hypertension Father    Diabetes Father    High blood pressure Father    High Cholesterol Father    Heart disease Father    Obesity Father    Diabetes Sister    Hypertension Sister    Thyroid  disease Brother    Thyroid  disease Maternal Aunt     Migraines Neg Hx    Family History Narrative: Father w/ hx of Heart Disease, Diabetes, High Cholesterol, High Blood Pressure/Hypertension, and Obesity Maternal Aunt w/ hx of Thyroid  Disease Mother w/ hx of Headaches, Kidney  Disease, High Cholesterol, High Blood Pressure/Hypertension, Obesity, and Thyroid  Disease 2 Brothers - 1 w/ hx of Thyroid  Disease Sister w/ hx of Hypertension and Hypothyroidism Social History   Social History Narrative   Lives at home with spouse   Right handed   Caffeine: diet zero mtn dew, occasionally     2025 - 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 Divorced, remarried - husband is disabled. 2 adult twin daughters. Non-smoker or drinker.   Review of Systems  Constitutional:  Negative for chills, fever, malaise/fatigue and weight loss.  HENT:  Negative for hearing loss, sinus pain and sore throat.        (+) Hoarseness  Respiratory:  Positive for cough and shortness of breath. Negative for hemoptysis and sputum production.   Cardiovascular:  Positive for leg swelling. Negative for chest pain, palpitations and PND.  Gastrointestinal:  Negative for abdominal pain, constipation, diarrhea, heartburn, nausea and vomiting.  Genitourinary:  Negative for dysuria, frequency and urgency.  Musculoskeletal:  Negative for back pain, myalgias and neck pain.  Skin:  Negative for itching and rash.  Neurological:  Negative for dizziness, tingling, seizures and headaches.  Endo/Heme/Allergies:  Negative for polydipsia.  Psychiatric/Behavioral:  Negative for depression. The patient is not nervous/anxious.     Objective:  Vitals: BP 100/80   Pulse 68   Ht 5\' 7"  (1.702 m)   Wt (!) 301 lb (136.5 kg)   SpO2 98%   BMI 47.14 kg/m  Physical Exam Vitals and nursing note reviewed.  Constitutional:      General: She is not in acute distress.    Appearance: Normal appearance. She is not ill-appearing or toxic-appearing.  HENT:     Head:  Normocephalic and atraumatic.     Right Ear: Hearing, tympanic membrane, ear canal and external ear normal.     Left Ear: Hearing, tympanic membrane, ear canal and external ear normal.     Mouth/Throat:     Pharynx: Oropharynx is clear.  Eyes:     Extraocular Movements: Extraocular movements intact.     Pupils: Pupils are equal, round, and reactive to light.  Neck:     Thyroid : No thyroid  mass, thyromegaly or thyroid  tenderness.     Vascular: No carotid bruit.  Cardiovascular:     Rate and Rhythm: Normal rate and regular rhythm. No extrasystoles are present.    Pulses:          Dorsalis pedis pulses are 2+ on the right side and 2+ on the left side.     Heart sounds: Normal heart sounds. No murmur heard.    No friction rub. No gallop.  Pulmonary:     Effort: Pulmonary effort is normal.     Breath sounds: Normal breath sounds. No decreased breath sounds, wheezing, rhonchi or rales.  Chest:     Chest wall: No mass.  Abdominal:     Palpations: Abdomen is soft. There is no hepatomegaly, splenomegaly or mass.     Tenderness: There is no abdominal tenderness.     Hernia: No hernia is present.  Musculoskeletal:     Cervical back: Normal range of motion.     Right lower leg: 1+ Edema present.     Left lower leg: 1+ Edema present.  Lymphadenopathy:     Cervical: No cervical adenopathy.     Upper Body:     Right upper body: No supraclavicular adenopathy.     Left upper body: No supraclavicular adenopathy.  Skin:  General: Skin is warm and dry.  Neurological:     General: No focal deficit present.     Mental Status: She is alert and oriented to person, place, and time. Mental status is at baseline.     Sensory: Sensation is intact.     Motor: Motor function is intact. No weakness.     Deep Tendon Reflexes: Reflexes are normal and symmetric.  Psychiatric:        Attention and Perception: Attention normal.        Mood and Affect: Mood normal.        Speech: Speech normal.         Behavior: Behavior normal.        Thought Content: Thought content normal.        Cognition and Memory: Cognition normal.        Judgment: Judgment normal.   Most Recent Functional Status Assessment:    07/13/2022    4:35 PM  In your present state of health, do you have any difficulty performing the following activities:  Hearing? 0  Vision? 0  Difficulty concentrating or making decisions? 0  Walking or climbing stairs? 1  Dressing or bathing? 0  Doing errands, shopping? 0   Most Recent Fall Risk Assessment:    06/19/2023    2:52 PM  Fall Risk   Falls in the past year? 1  Number falls in past yr: 1  Injury with Fall? 0   Most Recent Depression Screenings:    07/08/2023   11:46 AM 02/12/2023    8:38 AM  PHQ 2/9 Scores  PHQ - 2 Score 3 5  PHQ- 9 Score 15 22   Results:  Studies Obtained And Personally Reviewed By Me:  Mammogram 05/29/2022  normal.  Colonoscopy 05/14/2022  removed one small sessile Polyp in mid-ascending colon (found to be a diminutive tubular adenoma per pathology); otherwise normal.  Bone Density 03/24/2018  T-score Forearm Radius 1.5, normal.   CT ANGIOGRAPHY CHEST WITH CONTRAST  06/12/2023  FINDINGS:  Cardiovascular: Adequate opacification of the pulmonary arterial tree. No intraluminal filling defect identified to suggest acute pulmonary embolism. Central pulmonary arteries are of normal caliber. No significant coronary artery calcification. Cardiac size within normal limits. No pericardial effusion. No significant atherosclerotic calcification within the thoracic aorta. No aortic aneurysm.   Mediastinum/Nodes: No enlarged mediastinal, hilar, or axillary lymph nodes. Thyroid  gland, trachea, and esophagus demonstrate no significant findings.   Lungs/Pleura: Mild elevation of the left hemidiaphragm. Mild bibasilar atelectasis. Lungs are otherwise clear. No pneumothorax or pleural effusion. Bronchomalacia involving the right bronchus intermedius  and left lower lobar bronchus. Mild bronchial wall thickening present in keeping with airway inflammation.   Upper Abdomen: No acute abnormality. 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 No acute bone abnormality. No suspicious lytic or blastic bone lesions.   Review of the MIP images confirms the above findings.   IMPRESSION: 1. No acute pulmonary embolism.  2. Mild bronchial wall thickening in keeping with airway inflammation.  3. 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  4. Mild elevation of the left hemidiaphragm.  5. 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    Labs:     Component Value Date/Time   NA 142 07/04/2023 1206   NA 144 09/03/2022 1425   K 3.9 07/04/2023 1206   CL 106 07/04/2023 1206  CO2 26 07/04/2023 1206   GLUCOSE 75 07/04/2023 1206   BUN 16 07/04/2023 1206   BUN 9 09/03/2022 1425   CREATININE 1.23 (H) 07/04/2023 1206   CREATININE 1.23 (H) 07/09/2022 1132   CALCIUM  8.8 07/04/2023 1206   PROT 7.5 07/04/2023 1206   PROT 7.8 09/03/2022 1425   ALBUMIN 4.0 07/04/2023 1206   ALBUMIN 4.2 09/03/2022 1425   AST 16 07/04/2023 1206   AST 26 10/16/2018 0903   ALT 21 07/04/2023 1206   ALT 26 10/16/2018 0903   ALKPHOS 57 07/04/2023 1206   BILITOT 0.5 07/04/2023 1206   BILITOT 0.2 09/03/2022 1425   BILITOT 0.5 10/16/2018 0903   GFRNONAA 51 (L) 06/12/2023 1810   GFRNONAA 93 06/30/2020 1105   GFRAA 108 06/30/2020 1105    Lab Results  Component Value Date   WBC 5.6 07/04/2023   HGB 11.6 (L) 07/04/2023   HCT 35.6 (L) 07/04/2023   MCV 98.6 07/04/2023   PLT 198.0 07/04/2023   Lab Results  Component Value Date   CHOL 220 (H) 07/04/2023   HDL 89.40 07/04/2023   LDLCALC 116 (H) 07/04/2023   TRIG 73.0 07/04/2023   CHOLHDL 2 07/04/2023   Lab  Results  Component Value Date   HGBA1C 6.0 07/04/2023    Lab Results  Component Value Date   TSH 0.84 07/04/2023    Assessment & Plan:   Orders Placed This Encounter  Procedures   Brain natriuretic peptide   POCT urinalysis dipstick   Meds ordered this encounter  Medications   furosemide  (LASIX ) 40 MG tablet    Sig: One tab every other day as needed for pitting edema.    Dispense:  30 tablet    Refill:  0   LORazepam  (ATIVAN ) 1 MG tablet    Sig: Take 1 tablet (1 mg total) by mouth 2 (two) times daily.    Dispense:  60 tablet    Refill:  3    Not to exceed 5 additional fills before 04/09/2025PT IS OUT OF REFILLS.   buPROPion  (WELLBUTRIN  XL) 150 MG 24 hr tablet    Sig: Take 1 tablet (150 mg total) by mouth daily.    Dispense:  90 tablet    Refill:  1   promethazine  (PHENERGAN ) 25 MG tablet    Sig: Take 1 tablet (25 mg total) by mouth every 8 (eight) hours as needed for nausea and/or vomiting    Dispense:  30 tablet    Refill:  5   levothyroxine  (SYNTHROID ) 112 MCG tablet    Sig: Take 1 tablet (112 mcg total) by mouth daily.    Dispense:  90 tablet    Refill:  3  Other Labs Reviewed today: 07/04/2023 BNP: 114  Palpitations; Non-sustained Atrial Tachycardia treated with Flecainide  50 mg daily, Metoprolol  succinate 25 mg daily, and started on Lasix  40 mg daily x3 days on 5/08 for Hypervolemia w/ Klor-Con  10 meq to prevent potassium deficiency. Blood Pressure: normotensive today at 100/80. Followed by Dr. Gaylyn [name], Cardiologist, who she saw on 06/11/2023, had a repeat echo ordered for 5/19, CT cardiac perfusion, and a long-term monitor attached 4/19 - 5/02. D-Dimer elevated at 2.84 on 4/15 after seeing Dr. Micael [name], seen in ED on 4/16.   Dependent edema- has Lasix  on hand  Hoarseness for a couple of months (according to ED note on 4/16 about 5-6 now), has had some SOB and coughing, but no expectoration or severe reflux. Saw Dr. Diania [name] on 4/30, who prescribed Symbicort  (which  she is taking twice daily), Prednisone , and a Zpak. She says that her pulseox doesn't drop during activities even when she is getting short of breath. When short of breath she says she has to sit and use her inhaler. 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 Referral placed for ENT. BNP ordered today r/o heart failure. ENT to see this Friday  Hyperlipidemia with  07/04/2023 Lipid Panel, compared to 06/2022: Cholesterol 220, elevated from 197; LDL 116, elevated from 99.  History of Reactive Hypoglycemia 2017 after gastric bypass surgery  Impaired Glucose Tolerance with 07/04/2023 HgbA1c 6.0%, decreased from 6.1% in 11/2022, elevated from 5.7 in 06/2022; Glucose 75. .   S/p Thyroidectomy 1980; Hypothyroidism treated with Levothyroxine  112 mcg. 07/04/2023 TSH: 0.84. Levothyroxine  refilled today.    Obesity; BMI 47.14, weight 301 pounds 07/08/2023, decreased from BMI 47.80, weight 305 pounds 3.2 oz on 07/04/2023.  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 Starting from 273 on 07/06/2014, she was 203 by the end of 2016. At the beginning of 2017 was 200 and weighed 199 by the end. 2018 started at 202 and end 201. In 2019 she started at 220 and ended at 105. 2020 went from 32 to 2. 2021 went from 262 to 263, and then in 2022 went from 263 to 262. 2023 she started at 56 and ended 262. Was 277 at the start of 2024 and 297 by the end.       History of Iron Deficiency; Benign Ethnic Neutropenia with 07/04/2023 CBC, compared to 07/02/2022: RBC 3.61, decreased from 3.73; Hgb 11.6, decreased form 12.2; HCT 35.6, decreased from 37.6; otherwise WNL - neutrophils at 62.2.    Vitamin-B12 Deficiency treated with Dodex  1 mL IM monthly.   Vitamin-D Deficiency treated with Vitamin-D 50,000 units weekly.   Anxiety/Depression  treated with Lorazepam  1 mg twice daily and Wellbutrin -XL 150 mg daily. Lorazepam  and Wellbutrin -XL refilled today. Depressed over health issues  Asthma treated with Albuterol  inhaler as needed.   Hidradenitis Suppurativa treated with Humira  40 mg injected weekly.  Musculoskeletal Pain; Chronic Back Pain treated with Gabapentin  600 mg three times daily and Tizanidine  4 mg three times daily.  S/p Total Right Knee Arthroplasty by Dr. Lucienne [name] 07/13/2022; S/p Total Left Knee Arthroplasty by Dr. France Ina 01/20/2018.   Migraine Headaches w/ associated Vertigo treated with Emgality  120 mg monthly and Ubrelvy  100 mg as needed every two hours, maximum 200 mg. Followed by Dr. Aldona Amel. Also uses Phenergan  25 mg as needed for nausea. Refilled Phenergan  today.   Chronic Kidney Disease w/ 07/04/2023 CMP, compared to 07/02/2022: Creatinine 1.23, elevated from 1.08; GFR 47.08.    S/p Total Abdominal Hysterectomy 2004 for Fibroids, tubes and ovaries remain. Followed by Dr. Colvin Dec, Gynecologist.  Using Estradiol  gel for Estrogen Replacement.   Overdue for Mammogram; last completed 05/29/2022  normal.  Colonoscopy 05/14/2022  removed one small sessile Polyp in mid-ascending colon (found to be a diminutive tubular adenoma per pathology); otherwise normal with repeat recommendation of 2031.   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 Followed by Dr. Tami Falcon.   Bone Density 03/24/2018  T-score Forearm Radius 1.5, normal.   Vaccine Counseling: Due for Flu and Covid-19; UTD on Flu, Shingles 2/2, and Tdap. PNA given today.    Next step is Echocardiogram in  addition to ENT evaluation.Dr. Micael [name], Cardiologist , is  aware of patient's condition and is working to get her evaluated as soon as possible. Has ENT appt for hoarseness at [facility] here in [place] this coming Friday.    Annual wellness visit done today including the all of the following: Reviewed patient's Family Medical  History Reviewed and updated list of patient's medical providers Assessment of cognitive impairment was done Assessed patient's functional ability Established a written schedule for health screening services Health Risk Assessent Completed and Reviewed  Discussed health benefits of physical activity, and encouraged her to engage in regular exercise appropriate for her age and condition.    I,Emily Lagle,acting as a Neurosurgeon for [name] Evener, MD.,have documented all relevant documentation on the behalf of [name] Evener, MD,as directed by  [name] Evener, MD while in the presence of [name] Evener, MD.   I, [name] Evener, MD, have reviewed all documentation for this visit. The documentation on 07/08/23 for the exam, diagnosis, procedures, and orders are all accurate and complete.

## 2023-07-05 ENCOUNTER — Other Ambulatory Visit: Payer: 59

## 2023-07-05 ENCOUNTER — Other Ambulatory Visit: Payer: Self-pay | Admitting: Internal Medicine

## 2023-07-05 ENCOUNTER — Other Ambulatory Visit: Payer: Self-pay | Admitting: Nurse Practitioner

## 2023-07-05 DIAGNOSIS — Z1231 Encounter for screening mammogram for malignant neoplasm of breast: Secondary | ICD-10-CM

## 2023-07-05 DIAGNOSIS — E8770 Fluid overload, unspecified: Secondary | ICD-10-CM

## 2023-07-05 DIAGNOSIS — Z0000 Encounter for general adult medical examination without abnormal findings: Secondary | ICD-10-CM

## 2023-07-05 DIAGNOSIS — R7303 Prediabetes: Secondary | ICD-10-CM

## 2023-07-05 DIAGNOSIS — G4709 Other insomnia: Secondary | ICD-10-CM

## 2023-07-05 DIAGNOSIS — E785 Hyperlipidemia, unspecified: Secondary | ICD-10-CM

## 2023-07-05 DIAGNOSIS — E039 Hypothyroidism, unspecified: Secondary | ICD-10-CM

## 2023-07-05 DIAGNOSIS — R002 Palpitations: Secondary | ICD-10-CM | POA: Diagnosis not present

## 2023-07-05 MED ORDER — POTASSIUM CHLORIDE CRYS ER 10 MEQ PO TBCR: 10.0000 meq | EXTENDED_RELEASE_TABLET | Freq: Every day | ORAL | 0 refills | Status: DC

## 2023-07-08 ENCOUNTER — Ambulatory Visit (INDEPENDENT_AMBULATORY_CARE_PROVIDER_SITE_OTHER): Payer: 59 | Admitting: Internal Medicine

## 2023-07-08 ENCOUNTER — Encounter: Payer: Self-pay | Admitting: Internal Medicine

## 2023-07-08 VITALS — BP 100/80 | HR 68 | Ht 67.0 in | Wt 301.0 lb

## 2023-07-08 DIAGNOSIS — R531 Weakness: Secondary | ICD-10-CM

## 2023-07-08 DIAGNOSIS — Z0000 Encounter for general adult medical examination without abnormal findings: Secondary | ICD-10-CM | POA: Diagnosis not present

## 2023-07-08 DIAGNOSIS — E890 Postprocedural hypothyroidism: Secondary | ICD-10-CM

## 2023-07-08 DIAGNOSIS — Z6841 Body Mass Index (BMI) 40.0 and over, adult: Secondary | ICD-10-CM

## 2023-07-08 DIAGNOSIS — Z9884 Bariatric surgery status: Secondary | ICD-10-CM

## 2023-07-08 DIAGNOSIS — E8770 Fluid overload, unspecified: Secondary | ICD-10-CM

## 2023-07-08 DIAGNOSIS — R609 Edema, unspecified: Secondary | ICD-10-CM | POA: Diagnosis not present

## 2023-07-08 DIAGNOSIS — R002 Palpitations: Secondary | ICD-10-CM

## 2023-07-08 DIAGNOSIS — R490 Dysphonia: Secondary | ICD-10-CM | POA: Diagnosis not present

## 2023-07-08 DIAGNOSIS — E785 Hyperlipidemia, unspecified: Secondary | ICD-10-CM

## 2023-07-08 DIAGNOSIS — Z67A1 Duffy null: Secondary | ICD-10-CM

## 2023-07-08 DIAGNOSIS — E538 Deficiency of other specified B group vitamins: Secondary | ICD-10-CM

## 2023-07-08 DIAGNOSIS — Z96643 Presence of artificial hip joint, bilateral: Secondary | ICD-10-CM

## 2023-07-08 DIAGNOSIS — Z8639 Personal history of other endocrine, nutritional and metabolic disease: Secondary | ICD-10-CM

## 2023-07-08 DIAGNOSIS — I4719 Other supraventricular tachycardia: Secondary | ICD-10-CM

## 2023-07-08 DIAGNOSIS — E7439 Other disorders of intestinal carbohydrate absorption: Secondary | ICD-10-CM

## 2023-07-08 DIAGNOSIS — R7302 Impaired glucose tolerance (oral): Secondary | ICD-10-CM

## 2023-07-08 DIAGNOSIS — Z23 Encounter for immunization: Secondary | ICD-10-CM

## 2023-07-08 DIAGNOSIS — E559 Vitamin D deficiency, unspecified: Secondary | ICD-10-CM

## 2023-07-08 DIAGNOSIS — E669 Obesity, unspecified: Secondary | ICD-10-CM

## 2023-07-08 DIAGNOSIS — Z8669 Personal history of other diseases of the nervous system and sense organs: Secondary | ICD-10-CM

## 2023-07-08 DIAGNOSIS — Z9889 Other specified postprocedural states: Secondary | ICD-10-CM

## 2023-07-08 DIAGNOSIS — R0602 Shortness of breath: Secondary | ICD-10-CM

## 2023-07-08 DIAGNOSIS — G4733 Obstructive sleep apnea (adult) (pediatric): Secondary | ICD-10-CM

## 2023-07-08 LAB — POCT URINALYSIS DIPSTICK
Bilirubin, UA: NEGATIVE
Blood, UA: NEGATIVE
Glucose, UA: NEGATIVE
Ketones, UA: NEGATIVE
Leukocytes, UA: NEGATIVE
Nitrite, UA: NEGATIVE
Protein, UA: NEGATIVE
Spec Grav, UA: 1.01 (ref 1.010–1.025)
Urobilinogen, UA: 0.2 U/dL
pH, UA: 6.5 (ref 5.0–8.0)

## 2023-07-08 LAB — BRAIN NATRIURETIC PEPTIDE: Brain Natriuretic Peptide: 64 pg/mL (ref <100)

## 2023-07-08 MED ORDER — LORAZEPAM 1 MG PO TABS: 1.0000 mg | ORAL_TABLET | Freq: Two times a day (BID) | ORAL | 3 refills | Status: DC

## 2023-07-08 MED ORDER — PROMETHAZINE HCL 25 MG PO TABS: 25.0000 mg | ORAL_TABLET | Freq: Three times a day (TID) | ORAL | 5 refills | Status: AC | PRN

## 2023-07-08 MED ORDER — FUROSEMIDE 40 MG PO TABS: ORAL_TABLET | ORAL | 0 refills | Status: DC

## 2023-07-08 MED ORDER — BUPROPION HCL ER (XL) 150 MG PO TB24: 150.0000 mg | ORAL_TABLET | Freq: Every day | ORAL | 1 refills | Status: DC

## 2023-07-08 MED ORDER — LEVOTHYROXINE SODIUM 112 MCG PO TABS
112.0000 ug | ORAL_TABLET | Freq: Every day | ORAL | 3 refills | Status: DC
Start: 2023-07-08 — End: 2024-01-10

## 2023-07-08 NOTE — Patient Instructions (Addendum)
 Pt has appt with [facility] Friday 2:15pm  to evaluate ongoing laryngitis. Dr. Micael Adas arranging for Echo and will see her soon.

## 2023-07-09 ENCOUNTER — Ambulatory Visit: Payer: Self-pay

## 2023-07-09 ENCOUNTER — Encounter: Payer: Self-pay | Admitting: Family Medicine

## 2023-07-09 ENCOUNTER — Telehealth (INDEPENDENT_AMBULATORY_CARE_PROVIDER_SITE_OTHER): Payer: 59 | Admitting: Family Medicine

## 2023-07-09 ENCOUNTER — Telehealth: Payer: Self-pay | Admitting: Cardiology

## 2023-07-09 ENCOUNTER — Other Ambulatory Visit

## 2023-07-09 DIAGNOSIS — R5381 Other malaise: Secondary | ICD-10-CM

## 2023-07-09 DIAGNOSIS — G4709 Other insomnia: Secondary | ICD-10-CM

## 2023-07-09 DIAGNOSIS — M791 Myalgia, unspecified site: Secondary | ICD-10-CM

## 2023-07-09 DIAGNOSIS — E039 Hypothyroidism, unspecified: Secondary | ICD-10-CM | POA: Diagnosis not present

## 2023-07-09 DIAGNOSIS — F439 Reaction to severe stress, unspecified: Secondary | ICD-10-CM

## 2023-07-09 DIAGNOSIS — I071 Rheumatic tricuspid insufficiency: Secondary | ICD-10-CM

## 2023-07-09 DIAGNOSIS — R609 Edema, unspecified: Secondary | ICD-10-CM | POA: Diagnosis not present

## 2023-07-09 DIAGNOSIS — R5383 Other fatigue: Secondary | ICD-10-CM | POA: Diagnosis not present

## 2023-07-09 DIAGNOSIS — E559 Vitamin D deficiency, unspecified: Secondary | ICD-10-CM | POA: Diagnosis not present

## 2023-07-09 DIAGNOSIS — R0602 Shortness of breath: Secondary | ICD-10-CM | POA: Diagnosis not present

## 2023-07-09 DIAGNOSIS — R948 Abnormal results of function studies of other organs and systems: Secondary | ICD-10-CM

## 2023-07-09 DIAGNOSIS — R002 Palpitations: Secondary | ICD-10-CM

## 2023-07-09 DIAGNOSIS — E538 Deficiency of other specified B group vitamins: Secondary | ICD-10-CM | POA: Diagnosis not present

## 2023-07-09 DIAGNOSIS — G43009 Migraine without aura, not intractable, without status migrainosus: Secondary | ICD-10-CM | POA: Diagnosis not present

## 2023-07-09 DIAGNOSIS — R531 Weakness: Secondary | ICD-10-CM

## 2023-07-09 DIAGNOSIS — I340 Nonrheumatic mitral (valve) insufficiency: Secondary | ICD-10-CM

## 2023-07-09 DIAGNOSIS — I4719 Other supraventricular tachycardia: Secondary | ICD-10-CM

## 2023-07-09 DIAGNOSIS — Z79899 Other long term (current) drug therapy: Secondary | ICD-10-CM

## 2023-07-09 MED ORDER — UBRELVY 100 MG PO TABS: 100.0000 mg | ORAL_TABLET | ORAL | 11 refills | Status: DC | PRN

## 2023-07-09 MED ORDER — METOPROLOL SUCCINATE ER 25 MG PO TB24: 37.5000 mg | ORAL_TABLET | Freq: Every day | ORAL | 11 refills | Status: AC

## 2023-07-09 MED ORDER — UBRELVY 100 MG PO TABS: 100.0000 mg | ORAL_TABLET | ORAL | 11 refills | Status: AC | PRN

## 2023-07-09 NOTE — Telephone Encounter (Signed)
 Patient is returning call. Please advise?

## 2023-07-09 NOTE — Telephone Encounter (Signed)
-----   Message from Gaylyn Keas sent at 07/08/2023  4:30 PM EDT ----- Heart monitor showed multiple episodes of SVT lasting as long as 20 beats with fastest heart rate 188 bpm corresponding to patient's symptoms of palpitations.  Her average heart rate is 69 bpm so I think we can actually increase her beta-blocker.  Increase Toprol  to 37.5 mg daily.  I would like her to get in with A-fib clinic or EP for further evaluation.  She currently is on flecainide  and may need to have dose increased

## 2023-07-09 NOTE — Addendum Note (Signed)
 Addended by: Wilma Michaelson L on: 07/09/2023 12:12 PM   Modules accepted: Orders

## 2023-07-09 NOTE — Telephone Encounter (Signed)
 Called this patient- ID X [id number]- She had a few questions about the instructions. Went over them with the patient.  Sent in RX for Toprol  XL 37.5 mg to preferred pharmacy.  Informed that someone will contact her to get an appt with either Afib clinic or EP. She reports some shortness of breath at times. Informed her that if the sob worsens and/or is more persistent and/or she experiences dizziness and/or fainting- she needs to go to the ER. She verbalized understanding.

## 2023-07-09 NOTE — Telephone Encounter (Signed)
 Call to discuss heart monitor results, no answer, LVM per DPR. Also sent [facility] message explaining treatment plan recommended by Dr. Micael Adas.

## 2023-07-10 ENCOUNTER — Other Ambulatory Visit: Payer: Self-pay

## 2023-07-10 ENCOUNTER — Ambulatory Visit
Admission: RE | Admit: 2023-07-10 | Discharge: 2023-07-10 | Disposition: A | Discharge status: Home / Self Care | Source: Ambulatory Visit | Attending: Internal Medicine | Admitting: Internal Medicine

## 2023-07-10 ENCOUNTER — Ambulatory Visit: Payer: Self-pay | Admitting: Internal Medicine

## 2023-07-10 DIAGNOSIS — E611 Iron deficiency: Secondary | ICD-10-CM

## 2023-07-10 DIAGNOSIS — Z9884 Bariatric surgery status: Secondary | ICD-10-CM

## 2023-07-10 DIAGNOSIS — Z1231 Encounter for screening mammogram for malignant neoplasm of breast: Secondary | ICD-10-CM

## 2023-07-10 NOTE — Telephone Encounter (Signed)
 Discussed results with patient by phone. She has low normal iron level and is postmenopausal s/p gastric by-pass surgery. Tells me she used to see Dr. Maria Shiner for iron injections. Will refer back to him. Needs to take monthly B12 injections. Is able to administer these herself.  ANA is still pending. Needs to get involved in light exercise program if Cardiovascular status is stable.  MJB, MD

## 2023-07-11 LAB — IRON,TIBC AND FERRITIN PANEL
%SAT: 12 % — ABNORMAL LOW (ref 16–45)
Ferritin: 12 ng/mL — ABNORMAL LOW (ref 16–288)
Iron: 49 ug/dL (ref 45–160)
TIBC: 396 ug/dL (ref 250–450)

## 2023-07-11 LAB — VITAMIN D 25 HYDROXY (VIT D DEFICIENCY, FRACTURES): Vit D, 25-Hydroxy: 51 ng/mL (ref 30–100)

## 2023-07-11 LAB — CK, TOTAL(REFL): Total CK: 77 U/L (ref 20–243)

## 2023-07-11 LAB — ANTI-NUCLEAR AB-TITER (ANA TITER): ANA Titer 1: 1:160 {titer} — ABNORMAL HIGH

## 2023-07-11 LAB — SEDIMENTATION RATE: Sed Rate: 43 mm/h — ABNORMAL HIGH (ref 0–30)

## 2023-07-11 LAB — ANA: Anti Nuclear Antibody (ANA): POSITIVE — AB

## 2023-07-11 LAB — VITAMIN B12: Vitamin B-12: 386 pg/mL (ref 200–1100)

## 2023-07-11 LAB — MAGNESIUM: Magnesium: 2.4 mg/dL (ref 1.5–2.5)

## 2023-07-11 LAB — RHEUMATOID FACTOR: Rheumatoid fact SerPl-aCnc: 10 [IU]/mL (ref <14)

## 2023-07-11 LAB — FOLATE: Folate: 7.4 ng/mL

## 2023-07-12 ENCOUNTER — Encounter: Payer: Self-pay | Admitting: Family

## 2023-07-12 ENCOUNTER — Other Ambulatory Visit (HOSPITAL_COMMUNITY): Payer: Self-pay

## 2023-07-12 ENCOUNTER — Telehealth: Payer: Self-pay

## 2023-07-12 DIAGNOSIS — J370 Chronic laryngitis: Secondary | ICD-10-CM | POA: Diagnosis not present

## 2023-07-12 DIAGNOSIS — R490 Dysphonia: Secondary | ICD-10-CM | POA: Diagnosis not present

## 2023-07-12 NOTE — Telephone Encounter (Signed)
 Pharmacy Patient Advocate Encounter  Received notification from [facility] that Prior Authorization for Ubrelvy  100MG  tablets has been APPROVED from 07/12/2023 to 07/11/2024. Ran test claim, Copay is $0. This test claim was processed through [facility]- copay amounts may vary at other pharmacies due to pharmacy/plan contracts, or as the patient moves through the different stages of their insurance plan.   PA #/Case ID/Reference #: PA Case ID #: [id number]

## 2023-07-15 ENCOUNTER — Ambulatory Visit: Admitting: Nurse Practitioner

## 2023-07-15 ENCOUNTER — Ambulatory Visit (HOSPITAL_COMMUNITY)
Admission: RE | Admit: 2023-07-15 | Discharge: 2023-07-15 | Disposition: A | Discharge status: Home / Self Care | Source: Ambulatory Visit | Attending: Cardiology | Admitting: Cardiology

## 2023-07-15 ENCOUNTER — Encounter: Payer: Self-pay | Admitting: Nurse Practitioner

## 2023-07-15 VITALS — BP 128/90 | HR 71 | Temp 98.3°F | Ht 67.0 in | Wt 305.4 lb

## 2023-07-15 DIAGNOSIS — I340 Nonrheumatic mitral (valve) insufficiency: Secondary | ICD-10-CM

## 2023-07-15 DIAGNOSIS — R0609 Other forms of dyspnea: Secondary | ICD-10-CM

## 2023-07-15 DIAGNOSIS — G4733 Obstructive sleep apnea (adult) (pediatric): Secondary | ICD-10-CM

## 2023-07-15 DIAGNOSIS — R002 Palpitations: Secondary | ICD-10-CM

## 2023-07-15 DIAGNOSIS — R0602 Shortness of breath: Secondary | ICD-10-CM

## 2023-07-15 DIAGNOSIS — J310 Chronic rhinitis: Secondary | ICD-10-CM

## 2023-07-15 DIAGNOSIS — E8770 Fluid overload, unspecified: Secondary | ICD-10-CM

## 2023-07-15 LAB — BASIC METABOLIC PANEL WITH GFR
BUN: 18 mg/dL (ref 6–23)
CO2: 26 meq/L (ref 19–32)
Calcium: 9 mg/dL (ref 8.4–10.5)
Chloride: 109 meq/L (ref 96–112)
Creatinine, Ser: 1.17 mg/dL (ref 0.40–1.20)
GFR: 49.98 mL/min — ABNORMAL LOW (ref >60.00)
Glucose, Bld: 101 mg/dL — ABNORMAL HIGH (ref 70–99)
Potassium: 4.2 meq/L (ref 3.5–5.1)
Sodium: 143 meq/L (ref 135–145)

## 2023-07-15 LAB — ECHOCARDIOGRAM COMPLETE
Area-P 1/2: 4.31 cm2
S' Lateral: 2.69 cm

## 2023-07-15 NOTE — Patient Instructions (Addendum)
 Continue Albuterol  inhaler 2 puffs every 6 hours as needed for shortness of breath or wheezing. Notify if symptoms persist despite rescue inhaler/neb use.  Continue Symbicort  2 puffs Twice daily for now. Brush tongue and rinse mouth afterwards   Continue furosemide  40 mg every other day as prescribed by Dr. Liane Redman. Take in AM. May add on potassium depending on your current levels; I'll call you about this.    Labs today    Continue Benzonatate  1 capsule Three times a day for cough   Continue to use CPAP every night, minimum of 4-6 hours a night.  Change equipment as directed. Wash your tubing with warm soap and water  daily, hang to dry. Wash humidifier portion weekly. Use bottled, distilled water  and change daily Be aware of reduced alertness and do not drive or operate heavy machinery if experiencing this or drowsiness.  Notify if persistent daytime sleepiness occurs even with consistent use of PAP therapy.  Follow up in 6 weeks after PFT with Dr. Diania Fortes or Gina Lagos. If symptoms do not improve or worsen, please contact office for sooner follow up or seek emergency care.

## 2023-07-15 NOTE — Progress Notes (Signed)
 @Patient  ID: Victoria [name], female    DOB: 07/13/1960, 63 y.o.   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 She is a patient of Dr. Reine Caraway and last seen in office 07/04/2023 by [facility] NP. Past medical history significant for GERD, hypothyroidism, OA, anxiety, obesity s/p gastric bypass, pernicious anemia, insomnia, IDA.  TEST/EVENTS:  11/09/2021 HST: AHI 11.7, mild OSA 02/15/2022 PFTs: FVC 82, FEV1 91, ratio 86, TLC 91, DLCOcor 93. No BD. Normal PFTs 06/12/2023 CTA chest: negative for PE. Atherosclerosis. Mild elevation with left hemidiaphragm. Mild bibasilar atelectasis. Lungs otherwise clear. 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 Mild bronchial wall thickening. Degenerative disc disease.  07/04/2023 CXR: low lung volumes.  07/04/2023 FeNO 17 ppb   10/23/2021: OV with Athalee Esterline NP for sleep consult referred by Dr. Micael Adas for excessive daytime fatigue symptoms. She has been struggling with her fatigue for many years now; however, she had COVID in May and feels like her symptoms have been worse since then. She's also having trouble staying asleep at night. She will wake up after about 3-4 hours and struggles to get back to sleep. Sometimes she just gets out of the bed to start her day. Her husband tells her that she snores loudly. She wakes up in the morning with a headache and dry mouth. She denies waking up gasping, witnessed apneas, sleep parasomnias/paralysis, drowsy driving, cataplexy.  She goes to bed around 6 PM.  Sometimes a few hours later.  Sleep onset varies; sometimes she has no difficulties and other times, it takes her a little while. She does take Xanax  to help her fall asleep at night.  Does not necessarily feel anxious but she feels like her mind races.  Wakes up several  times throughout the night; often to use the restroom.  Officially gets out of bed in the morning anytime between 1 AM and 5:15 AM. She has cut back on her caffeine intake throughout the day.  No previous sleep study Had gastric bypass in 2016, lost a significant amount of weight; however, she has slowly put weight back on over the past few years and is up about 40 pounds. 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 Never smoker. She does not drink alcohol. Lives at home with her husband. She has a history of atrial tachycardia and followed by cardiology. She also sees Dr. Cornel Diesel with asthma/allergy  for chronic cough and rhinitis. Her sister has sleep apnea and sleeps with CPAP. Home sleep study ordered.  11/17/2021: OV with Terica Yogi NP for follow-up to discuss home sleep study results.  Her sleep study revealed AHI of 11.7/h consistent with mild OSA.  She continues to struggle with daytime fatigue symptoms and restless sleep at night.  She has had trouble staying asleep for a long time now and usually wakes up after 3 to 4 hours.  She has also been told that she snores loudly.  Commonly wakes up in the morning with a headache or dry mouth.  She denies any sleep parasomnia/paralysis, drowsy driving.  No narcolepsy or cataplexy symptoms. Set up on auto CPAP 5-15 She recently had some trouble with dizziness.  Feels like it may be vertigo.  It happened yesterday and has not happened since.  She denied any palpitations, syncope, headaches, vision changes. She also  tells me that she has been struggling with shortness of breath over the past few months.  She had COVID in May and feels like her breathing has not been the greatest since.  She feels like she gets more winded with activity.  She does have an albuterol  inhaler which she will use occasionally.  Last time she used it was yesterday.  She does have good response to this.  She denies any significant cough, wheezing, chest congestion, lower extremity swelling.  No  significant pulmonary history.  No significant occupational or environmental exposures. PFTs ordered for further evaluation.   02/15/2022: Ov with Frisco Cordts NP Patient presents today for follow up after being started on CPAP and having PFTs. She had normal pulmonary function testing completed today. Her breathing has improved since she was here last. She's been working on weight loss measures as well and is down 13 pounds. She rarely uses albuterol . No significant cough or chest congestion. She wears CPAP nightly. She's receiving good benefit from use. Feels like she's waking more rested and she is no longer waking throughout the night. Denies morning headaches or drowsy driving. Snoring has corrected with CPAP use. Settled down with full face mask. She still has some trouble with sleep onset. [facility] provider [facility] prescribed her lexapro  as she was having some symptoms of depression. She has only been on this a few weeks but feels like she's noticed some benefit. She will also use xanax  as needed for sleep onset. Would like to hold off on further pharmacological therapy at this time as she is trying to limit the medications she is on. She has used trazodone  in the past without any relief.   08/21/2022: OV with Caleigha Zale for follow up. Since she was here last, she had knee surgery in May. She went to ED a few days after her surgery due to AMS. She had been taking gabapentin , oxycodone , lexapro  and xanax  at home post-operatively. She has since all of these but the gabapentin . Her last dose of oxycodone  was over a week ago. She has not had any further issues since. Feels like she is recovering well. She's still having a lot of trouble with her sleep, which has been an ongoing problem for her since our first visit. She was doing better with the lexapro  and occasional xanax  but she prefers not to take these now. She doesn't feel like her mood is any different; no depression or anxiety coming off of these.  She just felt like they helped her sleep better. She denies any sleep parasomnias/paralysis. No aberrant behavior.  She has been having trouble with her CPAP mask for the last month. She had a full face mask, which was working well, but then she got a new one and it kept leaking. They gave her a soft foam one, which seemed to do worse after she would clean it. She ordered another F20 mask, which she is going to try tonight. She denies any drowsy driving. She did feel better when she was on her CPAP.    10/08/2022: Ov with Corlene Sabia NP for follow up after being started on temazepam  for insomnia. She was unsuccessful with ambien  and lunesta . She had previously been taking ativan  and lexapro , which seemed to be helping with her sleep, but she stopped these in early June. She does feel like the temazepam  has helped at the 30 mg dose. Still has some night awakenings but symptoms are better. She does still struggle with anxiety, which is  worse now that she's back to worse. She hasn't talked to her PCP about this or restarting the lexapro  she was previously on. She denies any SI/HI, depression, or mood changes with temazepam . She is not wearing her CPAP every night but is trying to be more consistent. Tends to not wear it if she's having trouble sleeping. She denies any drowsy driving or morning headaches.  09/07/2022-10/06/2022 CPAP 5-15 cmH2O 18/30 days; 43% >4 hr; average use 4 hr 14 min Pressure 95th 9.7 Leaks 95th 0.9 AHI 0.8  06/19/2023: OV with Dr. Diania Fortes. SOB which led to visit with cardiologist. Positive d dimer prompting ED visit with negative CTA. She did have inflammation of the airways, concerning for bronchitis. Wheezing and SOB still. Uses albuterol  with minimal relief. Notes recent onset of voice loss. No GERD symptoms. Sleep is disrupted; not using her CPAP. SOB with minimal activity. Start ICS/LABA. Encouraged to resume CPAP.   07/04/2023: OV with Thong Feeny NP for acute visit due to ongoing SOB, which she  feels is worse than her last visit. Still having intermittent wheezing. Getting lightheaded as well. She is also having coughing fits. She does feel albuterol  inhaler is helping. Finished z pack and prednisone . She is also having pitting edema to her ankles. She was advised to go to the ED but declined so was schedule for acute visit.  Her weight is up 6 lb since she was here 4/23. She's having pretty significant leg swelling that's progressively worsened. Still coughing. It's dry. Having some voice hoarseness, which is baseline for her but feels like it's a little worse with all the coughing. No fevers, chills, hemoptysis, chest pain. Still using Symbicort . No noticeable change with z pack and prednisone . She has an echocardiogram scheduled for 4/17. She does have palpitations from time to time.  Back on her CPAP. Still feeling fatigued during the day. No drowsy driving.  06/03/2023-07/02/2023: CPAP 5-15 cmH2O 7/30 days; 20% >4 hr; average use 5 hr 24 min Pressure 14.5 Leaks 3.3 AHI 2.5  07/15/2023: Today - follow up Patient presents today for follow up. She does feel better since her last visit but still having some trouble with her breathing and cough. She doesn't seem to be having as many coughing fits as she was before. No sputum production. Her voice hoarseness is stable. She did see ENT who have referred her to a laryngologist. They did recommend speech therapy but she has deferred until she sees the specialist. She had some decreased mobility in her false vocal cords and inflammation of her true cords. She's not using the flonase . Not currently taking any antacids.   The swelling in her legs did resolve with lasix  but has started to recur after coming off of it. She was supposed to restart every other day but doesn't have any potassium so she held off. Her weight had gone down 4 lb and now she's back up the 4 lb. Denies orthopnea, CP. She did wear a cardiac monitor and her HR got up to 188 bpm. She  had echo today; results pending.   Trying to use her CPAP more consistently. She does feel better with it than without it. Denies any drowsy driving.     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    Immunization History  Administered Date(s) Administered   Influenza Split 11/22/2014   Influenza, Seasonal, Injecte, Preservative Fre 12/24/2022   Influenza,inj,Quad PF,6+ Mos  11/12/2013, 12/16/2015, 11/21/2021   Influenza,inj,quad, With Preservative 10/27/2016   Influenza-Unspecified 11/29/2020   PFIZER(Purple Top)SARS-COV-2 Vaccination 03/19/2019, 04/08/2019, 01/08/2020   Tdap 01/10/2012, 07/12/2021   Zoster Recombinant(Shingrix) 02/01/2020, 04/07/2020    Past Medical History:  Diagnosis Date   Anxiety    Back pain    Bursitis    right shoulder   Chronic kidney disease    hematuria, has been worked up, her normal   Chronic leukopenia    intermittant since 2010, followed by Dr. Liane Redman now   Chronic neck pain    Constipation    Degenerative joint disease of low back 09/02/2015   Dr. Rossie Coon   Displacement of lumbar intervertebral disc    Dysrhythmia    Tachycardia, PVCs, PACs   Edema of both lower legs    Fibrocystic breast    Gallbladder problem    GERD (gastroesophageal reflux disease)    Headache    per pt more stress/tension   Heart palpitations    SEE EPIC ENCOUTNER , CARDIOLOGY DR. Sophia Dustman TURNER 2018; reports on 05-16-17 " i haven't had any bouts of those lately"     Hemorrhoids    Hidradenitis suppurativa    History of Clostridium difficile infection 12/2010   History of exercise intolerance    ETT on 04-27-2016-- negative ([facility] treadmill score 7)   History of Helicobacter pylori infection 2001 and 09/ 2012   HSV-2 infection    genital   Hx of adenomatous colonic polyps 10/17/2007   Hydradenitis    per pt currently treated with Humira  injection   Hypertension    Hyperthyroidism     Hypocupremia    Hypothyroidism    Hypothyroidism, postsurgical 1980   IBS (irritable bowel syndrome)    Insomnia    Iron deficiency    Joint pain    Leukopenia    Lumbosacral radiculopathy    Lymphocytic colitis 05/14/2022   dx from colonoscopy   Medial meniscus tear    right knee   Mild obstructive sleep apnea    Mitral regurgitation    Mild by echo 06/2023   Numbness and tingling of right arm    Obesity    Osteoarthritis    Palpitations    Pernicious anemia    b12 def   Peroneal neuropathy    PONV (postoperative nausea and vomiting)    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   Prediabetes    Reactive hypoglycemia followed by pcp   post gastrectomy dumping syndrome   SOB (shortness of breath)    Spinal stenosis    Tendonitis    right shoulder   Tricuspid regurgitation    Mild to moderate echo 06/2023   Varicose veins    Vertigo    Vitamin B 12 deficiency    Vitamin D  deficiency     Tobacco History: Social History   Tobacco Use  Smoking Status Never  Smokeless Tobacco Never   Counseling given: Not Answered   Outpatient Medications Prior to Visit  Medication Sig Dispense Refill   albuterol  (VENTOLIN  HFA) 108 (90 Base) MCG/ACT inhaler Inhale 2 puffs into the lungs every 4 hours as needed for wheezing or shortness of breath. 18 g 3   benzonatate  (TESSALON ) 200 MG capsule Take 1 capsule (200 mg total) by mouth 3 (three) times daily as needed for cough. 30 capsule 1   budesonide  (ENTOCORT EC ) 3 MG 24 hr capsule Take 3 capsules (9 mg total) by mouth in the morning. 270 capsule 3   budesonide -formoterol  (SYMBICORT )  160-4.5 MCG/ACT inhaler Inhale 2 puffs into the lungs 2 (two) times daily. 1 each 12   buPROPion  (WELLBUTRIN  XL) 150 MG 24 hr tablet Take 1 tablet (150 mg total) by mouth daily. 90 tablet 1   Carbinoxamine  Maleate 4 MG TABS Take 1-2 tablets at night as needed for drainage and allergies. May cause drowsiness. 28 tablet 5   cyanocobalamin  (DODEX ) 1000  MCG/ML injection INJECT 1 ML INTRAMUSCULARLY EVERY MONTH 3 mL 11   estradiol  (ESTRACE  VAGINAL) 0.1 MG/GM vaginal cream Place 1 g vaginally 3 (three) times a week. 42.5 g 3   estradiol  (VIVELLE -DOT) 0.1 MG/24HR patch Place 1 patch (0.1 mg total) onto the skin 2 (two) times a week. 8 patch 12   flecainide  (TAMBOCOR ) 50 MG tablet TAKE 1 TABLET EVERY DAY AT 6 AM 90 tablet 3   fluticasone  (FLONASE ) 50 MCG/ACT nasal spray Place 1-2 sprays into both nostrils daily. 18.2 mL 2   furosemide  (LASIX ) 40 MG tablet One tab every other day as needed for pitting edema. 30 tablet 0   gabapentin  (NEURONTIN ) 600 MG tablet Take 1 tablet (600 mg total) by mouth 3 (three) times daily. 90 tablet 5   HYDROcodone  bit-homatropine (HYCODAN) 5-1.5 MG/5ML syrup Take 5 mLs by mouth every 6 (six) hours as needed for cough. 180 mL 0   HYRIMOZ  40 MG/0.4ML SOAJ      ipratropium (ATROVENT ) 0.03 % nasal spray Use 1-2 sprays in each nostril up to 3 times daily as needed for runny nose. Can take 15 minutes prior to meals. 30 mL 3   levothyroxine  (SYNTHROID ) 112 MCG tablet Take 1 tablet (112 mcg total) by mouth daily. 90 tablet 3   LORazepam  (ATIVAN ) 1 MG tablet Take 1 tablet (1 mg total) by mouth 2 (two) times daily. 60 tablet 3   metoprolol  succinate (TOPROL -XL) 25 MG 24 hr tablet Take 1.5 tablets (37.5 mg total) by mouth daily. 45 tablet 11   Olopatadine  HCl 0.2 % SOLN Apply 1 drop to eye daily as needed. (Patient taking differently: Apply 1 drop to eye daily as needed (allergies).) 2.5 mL 5   predniSONE  (DELTASONE ) 10 MG tablet Take 4 tablets (40 mg total) by mouth daily with breakfast. 20 tablet 0   promethazine  (PHENERGAN ) 25 MG tablet Take 1 tablet (25 mg total) by mouth every 8 (eight) hours as needed for nausea and/or vomiting 30 tablet 5   tiZANidine  (ZANAFLEX ) 4 MG tablet Take 1 tablet (4 mg total) by mouth 3 (three) times daily. 90 tablet 3   Ubrogepant  (UBRELVY ) 100 MG TABS Take 1 tablet (100 mg total) by mouth every 2 (two)  hours as needed. Maximum 200mg  a day. 16 tablet 11   Vitamin D , Ergocalciferol , (DRISDOL ) 1.25 MG (50000 UNIT) CAPS capsule TAKE 1 CAPSULE BY MOUTH ONCE A WEEK 4 capsule 2   potassium chloride  (KLOR-CON  M) 10 MEQ tablet Take 1 tablet (10 mEq total) by mouth daily for 3 days. 3 tablet 0   Facility-Administered Medications Prior to Visit  Medication Dose Route Frequency Provider Last Rate Last Admin   gadobenate dimeglumine  (MULTIHANCE ) injection 20 mL  20 mL Intravenous Once PRN Glory Larsen, MD         Review of Systems:   Constitutional: No night sweats, fevers, chills, or lassitude. +weight gain, fatigue (improved) HEENT: No headaches, difficulty swallowing, tooth/dental problems, or sore throat. No sneezing, itching, ear ache. +nasal congestion (chronic); hoarseness  CV:  +swelling in lower extremities; palpitations. No chest pain, orthopnea,  PND, anasarca, syncope Resp: +shortness of breath with exertion; cough. No excess mucus or change in color of mucus. Not no hemoptysis. No wheezing.  No chest wall deformity GI:  No heartburn, indigestion, abdominal pain, nausea, vomiting, diarrhea, change in bowel habits, loss of appetite, bloody stools.  GU: No dysuria, change in color of urine, urgency or frequency, nocturia.  No flank pain, no hematuria  Skin: No rash, lesions, ulcerations Neuro: No dizziness. No gait abnormalities. No memory impairment.  Psych: No depression or anxiety. Mood stable. +sleep disturbance     Physical Exam:  BP (!) 128/90 (BP Location: Right Arm, Patient Position: Sitting, Cuff Size: Large)   Pulse 71   Temp 98.3 F (36.8 C)   Ht 5\' 7"  (1.702 m)   Wt (!) 305 lb 6.4 oz (138.5 kg)   SpO2 98%   BMI 47.83 kg/m   GEN: Pleasant, interactive, well-kempt; morbidly obese; in no acute distress. HEENT:  Normocephalic and atraumatic. PERRLA. Sclera white. Nasal turbinates pink, moist and patent bilaterally. No rhinorrhea present. Oropharynx pink and moist,  without exudate or edema. No lesions, ulcerations, or postnasal drip. Mallampati II NECK:  Supple w/ fair ROM.  CV: RRR, no m/r/g, +1 pitting BLE edema. Pulses intact, +2 bilaterally. No cyanosis, pallor or clubbing. PULMONARY:  Unlabored, regular breathing. Clear bilaterally A&P w/o wheezes/rales/rhonchi. No accessory muscle use. No dullness to percussion. GI: BS present and normoactive. Soft, non-tender to palpation.  MSK: No erythema, warmth or tenderness.  Neuro: A/Ox3. No focal deficits noted.   Skin: Warm, no lesions or rashe Psych: Normal affect and behavior. Judgement and thought content appropriate.     Lab Results:  CBC    Component Value Date/Time   WBC 5.6 07/04/2023 1206   RBC 3.61 (L) 07/04/2023 1206   HGB 11.6 (L) 07/04/2023 1206   HGB 12.5 06/11/2023 0902   HGB 12.6 07/09/2016 1529   HCT 35.6 (L) 07/04/2023 1206   HCT 38.6 06/11/2023 0902   HCT 38.2 07/09/2016 1529   PLT 198.0 07/04/2023 1206   PLT 201 06/11/2023 0902   MCV 98.6 07/04/2023 1206   MCV 99 (H) 06/11/2023 0902   MCV 96.9 07/09/2016 1529   MCH 32.1 06/11/2023 0902   MCH 31.2 10/09/2022 0436   MCHC 32.5 07/04/2023 1206   RDW 14.5 07/04/2023 1206   RDW 13.1 06/11/2023 0902   RDW 13.5 07/09/2016 1529   LYMPHSABS 1.5 07/04/2023 1206   LYMPHSABS 1.3 09/03/2022 1425   LYMPHSABS 1.6 07/09/2016 1529   MONOABS 0.5 07/04/2023 1206   MONOABS 0.3 07/09/2016 1529   EOSABS 0.1 07/04/2023 1206   EOSABS 0.1 09/03/2022 1425   BASOSABS 0.0 07/04/2023 1206   BASOSABS 0.0 09/03/2022 1425   BASOSABS 0.0 07/09/2016 1529    BMET    Component Value Date/Time   NA 143 07/15/2023 1353   NA 144 09/03/2022 1425   K 4.2 07/15/2023 1353   CL 109 07/15/2023 1353   CO2 26 07/15/2023 1353   GLUCOSE 101 (H) 07/15/2023 1353   BUN 18 07/15/2023 1353   BUN 9 09/03/2022 1425   CREATININE 1.17 07/15/2023 1353   CREATININE 1.23 (H) 07/09/2022 1132   CALCIUM  9.0 07/15/2023 1353   GFRNONAA 51 (L) 06/12/2023 1810    GFRNONAA 93 06/30/2020 1105   GFRAA 108 06/30/2020 1105    BNP    Component Value Date/Time   BNP 64 07/08/2023 1144     Imaging:  DG ESOPHAGUS W DOUBLE CM (HD) Result Date: 07/16/2023  CLINICAL DATA:  Dysphagia. EXAM: ESOPHOGRAM / BARIUM SWALLOW / BARIUM TABLET STUDY TECHNIQUE: Combined double contrast and single contrast examination performed using effervescent crystals, thick barium liquid, and thin barium liquid. The patient was observed with fluoroscopy swallowing a 13 mm barium sulphate tablet. FLUOROSCOPY: Radiation Exposure Index (as provided by the fluoroscopic device): 0.0677 mGy Kerma COMPARISON:  None Available. FINDINGS: Mildly prominent cricopharyngeus is noted in lower cervical esophagus. No mass or stricture is noted in the esophagus. No definite hiatal hernia or reflux is noted. Barium tablet passed through esophagus and into stomach without difficulty or delay. IMPRESSION: Mild bulging is noted posteriorly in lower cervical esophagus most consistent with prominent cricopharyngeus muscle. No other abnormality seen in the esophagus. Electronically Signed   By: Rosalene Colon M.D.   On: 07/16/2023 15:16   ECHOCARDIOGRAM COMPLETE Result Date: 07/15/2023    ECHOCARDIOGRAM REPORT   Patient Name:   [facility] Carlotta Chew Date of Exam: 07/15/2023 Medical Rec #:  [id number]                 Height:       67.0 in Accession #:    [id number]                Weight:       301.0 lb Date of Birth:  [birth year]                BSA:          2.406 m Patient Age:    62 years                  BP:           100/80 mmHg Patient Gender: F                         HR:           69 bpm. Exam Location:  [address] Procedure: 2D Echo, 3D Echo and Strain Analysis (Both Spectral and Color Flow            Doppler were utilized during procedure). Indications:    R06.02 SOB  History:        Patient has prior history of Echocardiogram examinations, most                 recent 10/12/2021. Signs/Symptoms:Fatigue.  Palpitations.                 Prediabetes. Morbid obesity.  Sonographer:    Mylinda Asa RCS Referring Phys: [phone] TRACI R TURNER IMPRESSIONS  1. Left ventricular ejection fraction, by estimation, is 70 to 75%. The left ventricle has hyperdynamic function. The left ventricle has no regional wall motion abnormalities. Left ventricular diastolic parameters were normal. The average left ventricular global longitudinal strain is 18.6 %. The global longitudinal strain is normal.  2. Right ventricular systolic function is normal. The right ventricular size is normal.  3. The mitral valve is normal in structure. Mild mitral valve regurgitation.  4. Tricuspid valve regurgitation is mild to moderate.  5. The aortic valve is tricuspid. Aortic valve regurgitation is not visualized.  6. The inferior vena cava is normal in size with greater than 50% respiratory variability, suggesting right atrial pressure of 3 mmHg. Comparison(s): The left ventricular function is unchanged. FINDINGS  Left Ventricle: Left ventricular ejection fraction, by estimation, is 70 to 75%. The left ventricle has hyperdynamic function. The left ventricle has no regional  wall motion abnormalities. The average left ventricular global longitudinal strain is 18.6 %. Strain was performed and the global longitudinal strain is normal. The left ventricular internal cavity size was normal in size. There is no left ventricular hypertrophy. Left ventricular diastolic parameters were normal. Right Ventricle: The right ventricular size is normal. Right vetricular wall thickness was not assessed. Right ventricular systolic function is normal. Left Atrium: Left atrial size was normal in size. Right Atrium: Right atrial size was normal in size. Pericardium: There is no evidence of pericardial effusion. Mitral Valve: The mitral valve is normal in structure. Mild mitral valve regurgitation. Tricuspid Valve: The tricuspid valve is normal in structure. Tricuspid valve  regurgitation is mild to moderate. Aortic Valve: The aortic valve is tricuspid. Aortic valve regurgitation is not visualized. Pulmonic Valve: The pulmonic valve was not well visualized. Pulmonic valve regurgitation is not visualized. No evidence of pulmonic stenosis. Aorta: The aortic root and ascending aorta are structurally normal, with no evidence of dilitation. Venous: The inferior vena cava is normal in size with greater than 50% respiratory variability, suggesting right atrial pressure of 3 mmHg. IAS/Shunts: No atrial level shunt detected by color flow Doppler.  LEFT VENTRICLE PLAX 2D LVIDd:         4.75 cm   Diastology LVIDs:         2.69 cm   LV e' medial:    10.90 cm/s LV PW:         1.16 cm   LV E/e' medial:  11.5 LV IVS:        0.96 cm   LV e' lateral:   12.10 cm/s LVOT diam:     2.10 cm   LV E/e' lateral: 10.3 LV SV:         112 LV SV Index:   46        2D Longitudinal Strain LVOT Area:     3.46 cm  2D Strain GLS (A4C):   -20.8 %                          2D Strain GLS (A3C):   15.9 %                          2D Strain GLS (A2C):   -25.9 %                          2D Strain GLS Avg:     18.6 %                           3D Volume EF:                          3D EF:        58 %                          LV EDV:       144 ml                          LV ESV:       61 ml  LV SV:        83 ml RIGHT VENTRICLE RV Basal diam:  3.86 cm RV S prime:     10.90 cm/s TAPSE (M-mode): 2.4 cm LEFT ATRIUM             Index        RIGHT ATRIUM           Index LA diam:        3.20 cm 1.33 cm/m   RA Area:     15.90 cm LA Vol (A2C):   45.6 ml 18.95 ml/m  RA Volume:   47.60 ml  19.78 ml/m LA Vol (A4C):   45.8 ml 19.04 ml/m LA Biplane Vol: 48.9 ml 20.32 ml/m  AORTIC VALVE LVOT Vmax:   144.00 cm/s LVOT Vmean:  87.600 cm/s LVOT VTI:    0.322 m  AORTA Ao Root diam: 3.70 cm Ao Asc diam:  3.80 cm MITRAL VALVE MV Area (PHT): 4.31 cm     SHUNTS MV Decel Time: 176 msec     Systemic VTI:  0.32 m MV E  velocity: 125.00 cm/s  Systemic Diam: 2.10 cm MV A velocity: 99.60 cm/s MV E/A ratio:  1.26 Ola Berger MD Electronically signed by Ola Berger MD Signature Date/Time: 07/15/2023/4:50:24 PM    Final    MM 3D SCREENING MAMMOGRAM BILATERAL BREAST Result Date: 07/12/2023 CLINICAL DATA:  Screening. EXAM: DIGITAL SCREENING BILATERAL MAMMOGRAM WITH TOMOSYNTHESIS AND CAD TECHNIQUE: Bilateral screening digital craniocaudal and mediolateral oblique mammograms were obtained. Bilateral screening digital breast tomosynthesis was performed. The images were evaluated with computer-aided detection. COMPARISON:  Previous exam(s). ACR Breast Density Category b: There are scattered areas of fibroglandular density. FINDINGS: There are no findings suspicious for malignancy. IMPRESSION: No mammographic evidence of malignancy. A result letter of this screening mammogram will be mailed directly to the patient. RECOMMENDATION: Screening mammogram in one year. (Code:SM-B-01Y) BI-RADS CATEGORY  1: Negative. Electronically Signed   By: Dina  Arceo M.D.   On: 07/12/2023 09:44   LONG TERM MONITOR (3-14 DAYS) Result Date: 07/08/2023   Predominant rhythm was normal sinus rhythm with an average heartbeat of 69 bpm and range from 46 to 188 bpm.   26 episodes of SVT longest lasting 20 beats with max heart rate 180 bpm.  Patient was symptomatic during these episodes   Rare PACs, atrial couplets and triplets   Rare PVCs Patch Wear Time:  12 days and 20 hours (2025-04-19T14:50:46-0400 to 2025-05-02T11:34:47-0400) Patient had a min HR of 46 bpm, max HR of 188 bpm, and avg HR of 69 bpm. Predominant underlying rhythm was Sinus Rhythm. 26 Supraventricular Tachycardia runs occurred, the run with the fastest interval lasting 6 beats with a max rate of 188 bpm, the longest lasting 20 beats with an avg rate of 120 bpm. Supraventricular Tachycardia was detected within +/- 45 seconds of symptomatic patient event(s). Isolated SVEs were rare (<1.0%), SVE  Couplets were rare (<1.0%), and SVE Triplets were rare (<1.0%). Isolated VEs were rare (<1.0%), and no VE Couplets or VE Triplets were present.   DG Chest 2 View Result Date: 07/04/2023 CLINICAL DATA:  SOB, volume overload EXAM: CHEST - 2 VIEW COMPARISON:  August 01, 2021 FINDINGS: Low lung volumes. No focal airspace consolidation, pleural effusion, or pneumothorax. No cardiomegaly. No acute fracture or destructive lesion. Multilevel degenerative disc disease of the spine. Cholecystectomy clips. IMPRESSION: Low lung volumes.  Otherwise, no acute cardiopulmonary abnormality. Electronically Signed   By: Rance Burrows M.D.   On: 07/04/2023  13:02    Administration History     None          Latest Ref Rng & Units 02/15/2022    9:05 AM  PFT Results  FVC-Pre L 3.01   FVC-Predicted Pre % 82   FVC-Post L 2.99   FVC-Predicted Post % 81   Pre FEV1/FVC % % 86   Post FEV1/FCV % % 86   FEV1-Pre L 2.59   FEV1-Predicted Pre % 91   FEV1-Post L 2.58   DLCO uncorrected ml/min/mmHg 20.51   DLCO UNC% % 92   DLCO corrected ml/min/mmHg 20.91   DLCO COR %Predicted % 93   DLVA Predicted % 109   TLC L 5.02   TLC % Predicted % 91   RV % Predicted % 90     No results found for: "NITRICOXIDE"      Assessment & Plan:   Shortness of breath Improved but persistent DOE. She had volume overload and slightly elevated BNP at prior OV. Treated with lasix  course with improvement. She is having recurrent swelling with cessation of use. She was restarted on every other day dosing by her PCP but has not been taking due to not having any potassium - rx for 10 meq sent today to take on days with furosemide . Advised to follow up with PCP for future refills. Awaiting echo. No significant change in DOE with prior prednisone /abx use. Prior walk test without hypoxia on room air. Lung exam clear today.  TSH and CBC nl.  CT without any significant changes in lung parenchyma and no PE. Repeat PFT ordered for further  evaluation.   Patient Instructions  Continue Albuterol  inhaler 2 puffs every 6 hours as needed for shortness of breath or wheezing. Notify if symptoms persist despite rescue inhaler/neb use.  Continue Symbicort  2 puffs Twice daily for now. Brush tongue and rinse mouth afterwards   Continue furosemide  40 mg every other day as prescribed by Dr. Liane Redman. Take in AM. May add on potassium depending on your current levels; I'll call you about this.    Labs today    Continue Benzonatate  1 capsule Three times a day for cough   Continue to use CPAP every night, minimum of 4-6 hours a night.  Change equipment as directed. Wash your tubing with warm soap and water  daily, hang to dry. Wash humidifier portion weekly. Use bottled, distilled water  and change daily Be aware of reduced alertness and do not drive or operate heavy machinery if experiencing this or drowsiness.  Notify if persistent daytime sleepiness occurs even with consistent use of PAP therapy.  Follow up in 6 weeks after PFT with Dr. Diania Fortes or Gina Lagos. If symptoms do not improve or worsen, please contact office for sooner follow up or seek emergency care.    Palpitations Awaiting workup with cardiology. She informs me of HR of 188 bpm on recent cardiac monitor. Suspect this is a component of her DOE. Follow up with cardiology as scheduled.   Obstructive sleep apnea Encouraged to continue increasing usage of CPAP. Aware of risks of untreated OSA. Safe driving practices reviewed.   Mitral regurgitation Awaiting repeat echo for evaluation of progression  Chronic rhinitis Contributing to cough. ENT eval with recommendation to see speech therapy. She opted to wait on further workup with specialist. Waiting on appt with laryngologist.      I spent 35 minutes of dedicated to the care of this patient on the date of this encounter to include pre-visit review of  records, face-to-face time with the patient discussing conditions above,  post visit ordering of testing, clinical documentation with the electronic health record, making appropriate referrals as documented, and communicating necessary findings to members of the patients care team.  Roetta Clarke, NP 07/19/2023  Pt aware and understands NP's role.

## 2023-07-16 ENCOUNTER — Ambulatory Visit: Payer: Self-pay | Admitting: Nurse Practitioner

## 2023-07-16 ENCOUNTER — Ambulatory Visit
Admission: RE | Admit: 2023-07-16 | Discharge: 2023-07-16 | Disposition: A | Discharge status: Home / Self Care | Source: Ambulatory Visit | Attending: Gastroenterology | Admitting: Gastroenterology

## 2023-07-16 DIAGNOSIS — R1310 Dysphagia, unspecified: Secondary | ICD-10-CM

## 2023-07-16 DIAGNOSIS — E8770 Fluid overload, unspecified: Secondary | ICD-10-CM

## 2023-07-16 DIAGNOSIS — K2289 Other specified disease of esophagus: Secondary | ICD-10-CM | POA: Diagnosis not present

## 2023-07-16 MED ORDER — POTASSIUM CHLORIDE CRYS ER 10 MEQ PO TBCR: EXTENDED_RELEASE_TABLET | ORAL | 0 refills | Status: DC

## 2023-07-19 ENCOUNTER — Other Ambulatory Visit: Payer: Self-pay

## 2023-07-19 ENCOUNTER — Encounter: Payer: Self-pay | Admitting: Nurse Practitioner

## 2023-07-19 DIAGNOSIS — I340 Nonrheumatic mitral (valve) insufficiency: Secondary | ICD-10-CM | POA: Insufficient documentation

## 2023-07-19 DIAGNOSIS — I4719 Other supraventricular tachycardia: Secondary | ICD-10-CM

## 2023-07-19 DIAGNOSIS — I071 Rheumatic tricuspid insufficiency: Secondary | ICD-10-CM | POA: Insufficient documentation

## 2023-07-19 DIAGNOSIS — R002 Palpitations: Secondary | ICD-10-CM

## 2023-07-19 NOTE — Assessment & Plan Note (Signed)
 Awaiting repeat echo for evaluation of progression

## 2023-07-19 NOTE — Assessment & Plan Note (Signed)
 Awaiting workup with cardiology. She informs me of HR of 188 bpm on recent cardiac monitor. Suspect this is a component of her DOE. Follow up with cardiology as scheduled.

## 2023-07-19 NOTE — Telephone Encounter (Signed)
-----   Message from Gaylyn Keas sent at 07/19/2023  1:22 PM EDT ----- 2D echo showed normal pumping function of the heart EF 70 to 75% with mildly leaky mitral valve and mild to moderate leakiness of the tricuspid valve.  Otherwise normal echo.  No significant change from last echo in 2023.  Please repeat a 2D echo in 2 years for MR and TR

## 2023-07-19 NOTE — Progress Notes (Signed)
Referral to afib clinic placed.

## 2023-07-19 NOTE — Assessment & Plan Note (Signed)
 Encouraged to continue increasing usage of CPAP. Aware of risks of untreated OSA. Safe driving practices reviewed.

## 2023-07-19 NOTE — Progress Notes (Signed)
 07/19/2023 Addendum: Cardiology note from Dr. Micael Adas reviewed. Echo relatively stable from 2023 with normal EF, mild MR, and mild to moderate TR. Her cardiac monitor revealed multiple runs of SVT. Suspect this is the driving factor of her DOE. She has been referred to EP for further workup/treatment.

## 2023-07-19 NOTE — Addendum Note (Signed)
 Addended by: Cherylyn Cos on: 07/19/2023 02:26 PM   Modules accepted: Orders

## 2023-07-19 NOTE — Assessment & Plan Note (Signed)
 Contributing to cough. ENT eval with recommendation to see speech therapy. She opted to wait on further workup with specialist. Waiting on appt with laryngologist.

## 2023-07-19 NOTE — Telephone Encounter (Signed)
 MC message to patient in regards to echo results. Order for 1 yr repeat echo placed.

## 2023-07-19 NOTE — Assessment & Plan Note (Addendum)
 Improved but persistent DOE. She had volume overload and slightly elevated BNP at prior OV. Treated with lasix  course with improvement. She is having recurrent swelling with cessation of use. She was restarted on every other day dosing by her PCP but has not been taking due to not having any potassium - rx for 10 meq sent today to take on days with furosemide . Advised to follow up with PCP for future refills. Awaiting echo. No significant change in DOE with prior prednisone /abx use. Prior walk test without hypoxia on room air. Lung exam clear today.  TSH and CBC nl.  CT without any significant changes in lung parenchyma and no PE. Repeat PFT ordered for further evaluation.   Patient Instructions  Continue Albuterol  inhaler 2 puffs every 6 hours as needed for shortness of breath or wheezing. Notify if symptoms persist despite rescue inhaler/neb use.  Continue Symbicort  2 puffs Twice daily for now. Brush tongue and rinse mouth afterwards   Continue furosemide  40 mg every other day as prescribed by Dr. Liane Redman. Take in AM. May add on potassium depending on your current levels; I'll call you about this.    Labs today    Continue Benzonatate  1 capsule Three times a day for cough   Continue to use CPAP every night, minimum of 4-6 hours a night.  Change equipment as directed. Wash your tubing with warm soap and water  daily, hang to dry. Wash humidifier portion weekly. Use bottled, distilled water  and change daily Be aware of reduced alertness and do not drive or operate heavy machinery if experiencing this or drowsiness.  Notify if persistent daytime sleepiness occurs even with consistent use of PAP therapy.  Follow up in 6 weeks after PFT with Dr. Diania Fortes or Gina Lagos. If symptoms do not improve or worsen, please contact office for sooner follow up or seek emergency care.

## 2023-07-22 ENCOUNTER — Ambulatory Visit: Payer: 59 | Admitting: Orthopaedic Surgery

## 2023-07-23 DIAGNOSIS — N393 Stress incontinence (female) (male): Secondary | ICD-10-CM | POA: Diagnosis not present

## 2023-07-24 ENCOUNTER — Ambulatory Visit: Payer: 59 | Admitting: Orthopaedic Surgery

## 2023-07-25 NOTE — Addendum Note (Signed)
 Addended by: Marybeth Smock L on: 07/25/2023 02:00 PM   Modules accepted: Orders

## 2023-07-25 NOTE — Telephone Encounter (Signed)
 Answered all of patient's questions regarding echo results, patient verbalizes understanding of hyperdynamic function and that she does need to complete stress/pet/ct in addition to echo.   Also reviewed that we would refer to ep and afib clinic for flecainide  management.Patient verbalizes understanding.

## 2023-07-25 NOTE — Telephone Encounter (Signed)
-----   Message from Gaylyn Keas sent at 07/19/2023  1:22 PM EDT ----- 2D echo showed normal pumping function of the heart EF 70 to 75% with mildly leaky mitral valve and mild to moderate leakiness of the tricuspid valve.  Otherwise normal echo.  No significant change from last echo in 2023.  Please repeat a 2D echo in 2 years for MR and TR

## 2023-07-28 HISTORY — PX: OTHER SURGICAL HISTORY: SHX169

## 2023-07-29 ENCOUNTER — Inpatient Hospital Stay: Attending: Hematology & Oncology

## 2023-07-29 ENCOUNTER — Other Ambulatory Visit: Payer: Self-pay

## 2023-07-29 ENCOUNTER — Inpatient Hospital Stay

## 2023-07-29 ENCOUNTER — Inpatient Hospital Stay: Payer: Self-pay | Admitting: Family

## 2023-07-29 ENCOUNTER — Encounter: Payer: Self-pay | Admitting: Family

## 2023-07-29 ENCOUNTER — Encounter: Payer: Self-pay | Admitting: Internal Medicine

## 2023-07-29 VITALS — BP 150/80 | HR 61 | Resp 20 | Wt 309.0 lb

## 2023-07-29 VITALS — BP 129/83 | HR 71 | Temp 97.9°F | Resp 18

## 2023-07-29 DIAGNOSIS — K909 Intestinal malabsorption, unspecified: Secondary | ICD-10-CM

## 2023-07-29 DIAGNOSIS — D508 Other iron deficiency anemias: Secondary | ICD-10-CM | POA: Diagnosis not present

## 2023-07-29 DIAGNOSIS — D509 Iron deficiency anemia, unspecified: Secondary | ICD-10-CM | POA: Diagnosis not present

## 2023-07-29 LAB — CBC WITH DIFFERENTIAL (CANCER CENTER ONLY)
Abs Immature Granulocytes: 0.03 10*3/uL (ref 0.00–0.07)
Basophils Absolute: 0 10*3/uL (ref 0.0–0.1)
Basophils Relative: 0 %
Eosinophils Absolute: 0 10*3/uL (ref 0.0–0.5)
Eosinophils Relative: 1 %
HCT: 33 % — ABNORMAL LOW (ref 36.0–46.0)
Hemoglobin: 10.7 g/dL — ABNORMAL LOW (ref 12.0–15.0)
Immature Granulocytes: 1 %
Lymphocytes Relative: 15 %
Lymphs Abs: 1 10*3/uL (ref 0.7–4.0)
MCH: 31.4 pg (ref 26.0–34.0)
MCHC: 32.4 g/dL (ref 30.0–36.0)
MCV: 96.8 fL (ref 80.0–100.0)
Monocytes Absolute: 0.5 10*3/uL (ref 0.1–1.0)
Monocytes Relative: 7 %
Neutro Abs: 4.9 10*3/uL (ref 1.7–7.7)
Neutrophils Relative %: 76 %
Platelet Count: 236 10*3/uL (ref 150–400)
RBC: 3.41 MIL/uL — ABNORMAL LOW (ref 3.87–5.11)
RDW: 14.9 % (ref 11.5–15.5)
WBC Count: 6.4 10*3/uL (ref 4.0–10.5)
nRBC: 0 % (ref 0.0–0.2)

## 2023-07-29 LAB — RETICULOCYTES
Immature Retic Fract: 22 % — ABNORMAL HIGH (ref 2.3–15.9)
RBC.: 3.44 MIL/uL — ABNORMAL LOW (ref 3.87–5.11)
Retic Count, Absolute: 48.5 10*3/uL (ref 19.0–186.0)
Retic Ct Pct: 1.4 % (ref 0.4–3.1)

## 2023-07-29 LAB — FERRITIN: Ferritin: 13 ng/mL (ref 11–307)

## 2023-07-29 MED ORDER — SODIUM CHLORIDE 0.9 % IV SOLN
300.0000 mg | Freq: Once | INTRAVENOUS | Status: AC
  Administered 2023-07-29: 300 mg via INTRAVENOUS
  Filled 2023-07-29: qty 300

## 2023-07-29 MED ORDER — SODIUM CHLORIDE 0.9 % IV SOLN: Freq: Once | INTRAVENOUS | Status: AC

## 2023-07-29 NOTE — Progress Notes (Signed)
 Hematology and Oncology Follow Up Visit  Victoria [name] [id number] August 31, 1960 63 y.o. 07/29/2023   Principle Diagnosis:  Iron deficiency anemia secondary to malabsorption, s/p gastric bypass in 2016    Current Therapy:        IV iron as indicated    Interim History:  Victoria [name] is here today for annual follow-up. She is doing fairly well but notes fatigue, occasional dizziness and SOB with exertion.  No blood loss noted. No bruising or petechiae.  No fever, chills, n/v, cough, rash, dizziness, SOB, chest pain, palpitations, abdominal pain or changes in bowel or bladder habits.  No numbness or tingling in her extremities at this time.  Lasix  helps reduce her fluid retention.  No falls or syncope reported.  Appetite has been down but she is doing her best to stay well hydrated. Her weight is stable at 309 lbs.   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 If you have any questions, ask your nurse or doctor.          albuterol  108 (90 Base) MCG/ACT inhaler Commonly known as: VENTOLIN  HFA Inhale 2 puffs into the lungs every 4 hours as needed for wheezing or shortness of breath.   benzonatate  200 MG capsule Commonly known as: TESSALON  Take 1 capsule (200 mg total) by mouth 3 (three) times daily as needed for cough.   budesonide  3 MG 24 hr capsule Commonly known as: ENTOCORT EC  Take 3 capsules (9 mg total) by mouth in the morning.   budesonide -formoterol  160-4.5 MCG/ACT inhaler Commonly known as: Symbicort  Inhale 2 puffs into the lungs 2 (two) times daily.   buPROPion  150 MG 24 hr tablet Commonly known as: WELLBUTRIN  XL Take 1 tablet (150 mg total) by mouth daily.   Carbinoxamine  Maleate 4 MG Tabs Take 1-2 tablets at  night as needed for drainage and allergies. May cause drowsiness.   cyanocobalamin  1000 MCG/ML injection Commonly known as: Dodex  INJECT 1 ML INTRAMUSCULARLY EVERY MONTH   estradiol  0.1 MG/24HR patch Commonly known as: VIVELLE -DOT Place 1 patch (0.1 mg total) onto the skin 2 (two) times a week.   estradiol  0.1 MG/GM vaginal cream Commonly known as: ESTRACE  VAGINAL Place 1 g vaginally 3 (three) times a week.   flecainide  50 MG tablet Commonly known as: TAMBOCOR  TAKE 1 TABLET EVERY DAY AT 6 AM   fluticasone  50 MCG/ACT nasal spray Commonly known as: FLONASE  Place 1-2 sprays into both nostrils daily.   furosemide  40 MG tablet Commonly known as: Lasix  One tab every other day as needed for pitting edema.   gabapentin  600 MG tablet Commonly known as: NEURONTIN  Take 1 tablet (600 mg total) by mouth 3 (three) times daily.   HYDROcodone  bit-homatropine 5-1.5 MG/5ML syrup Commonly known as: HYCODAN Take 5 mLs by mouth every 6 (six) hours as needed for cough.   Hyrimoz  40 MG/0.4ML Soaj Generic drug: Adalimumab -adaz   ipratropium 0.03 % nasal spray Commonly known as: ATROVENT  Use 1-2 sprays in each nostril up to 3 times daily as needed for runny nose. Can take 15 minutes prior to meals.   levothyroxine  112 MCG tablet Commonly known as: SYNTHROID  Take 1 tablet (112  mcg total) by mouth daily.   LORazepam  1 MG tablet Commonly known as: ATIVAN  Take 1 tablet (1 mg total) by mouth 2 (two) times daily.   metoprolol  succinate 25 MG 24 hr tablet Commonly known as: TOPROL -XL Take 1.5 tablets (37.5 mg total) by mouth daily.   Olopatadine  HCl 0.2 % Soln Apply 1 drop to eye daily as needed. What changed: reasons to take this   potassium chloride  10 MEQ tablet Commonly known as: KLOR-CON  M Take 1 tablet every other day with lasix  (furosemide )   predniSONE  10 MG tablet Commonly known as: DELTASONE  Take 4 tablets (40 mg total) by mouth daily with breakfast.   promethazine  25 MG  tablet Commonly known as: PHENERGAN  Take 1 tablet (25 mg total) by mouth every 8 (eight) hours as needed for nausea and/or vomiting   tiZANidine  4 MG tablet Commonly known as: ZANAFLEX  Take 1 tablet (4 mg total) by mouth 3 (three) times daily.   Ubrelvy  100 MG Tabs Generic drug: Ubrogepant  Take 1 tablet (100 mg total) by mouth every 2 (two) hours as needed. Maximum 200mg  a day.   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  Review of Systems: All other 10 point review of systems is negative.   Physical Exam:  vitals were not taken for this visit.   Wt Readings from Last 3 Encounters:  07/15/23 (!) 305 lb 6.4 oz (138.5 kg)  07/08/23 (!) 301 lb (136.5 kg)  07/04/23 (!) 305 lb 3.2 oz (138.4 kg)    Ocular: Sclerae unicteric, pupils equal, round and reactive to light Ear-nose-throat: Oropharynx clear, dentition fair Lymphatic: No cervical or supraclavicular adenopathy Lungs no rales or rhonchi, good excursion bilaterally Heart regular rate and rhythm, no murmur appreciated Abd soft, nontender, positive bowel sounds MSK no focal spinal tenderness, no joint edema Neuro: non-focal, well-oriented, appropriate affect Breasts: Deferred   Lab Results  Component Value Date   WBC 5.6 07/04/2023   HGB 11.6 (L) 07/04/2023   HCT 35.6 (L) 07/04/2023   MCV 98.6 07/04/2023   PLT 198.0 07/04/2023   Lab Results  Component Value Date   FERRITIN 12 (L) 07/09/2023   IRON 49 07/09/2023   TIBC 396 07/09/2023   UIBC 360 08/17/2022   IRONPCTSAT 12 (L) 07/09/2023   Lab Results  Component Value Date   RETICCTPCT 2.1 08/17/2022   RBC 3.61 (L) 07/04/2023    RETICCTABS 56,550 07/07/2021   No results found for: "KPAFRELGTCHN", "LAMBDASER", "KAPLAMBRATIO" Lab Results  Component Value Date   IGGSERUM 1,609 (H) 05/16/2020   IGMSERUM 106 05/16/2020   No results found for: "TOTALPROTELP", "ALBUMINELP", "A1GS", "A2GS", "BETS", "BETA2SER", "GAMS", "MSPIKE", "SPEI"   Chemistry      Component Value Date/Time   NA 143 07/15/2023 1353   NA 144 09/03/2022 1425   K 4.2 07/15/2023 1353   CL 109 07/15/2023 1353   CO2 26 07/15/2023 1353   BUN 18 07/15/2023 1353   BUN 9 09/03/2022 1425   CREATININE 1.17 07/15/2023 1353   CREATININE 1.23 (H) 07/09/2022 1132      Component Value Date/Time   CALCIUM  9.0 07/15/2023 1353   ALKPHOS 57  07/04/2023 1206   AST 16 07/04/2023 1206   AST 26 10/16/2018 0903   ALT 21 07/04/2023 1206   ALT 26 10/16/2018 0903   BILITOT 0.5 07/04/2023 1206   BILITOT 0.2 09/03/2022 1425   BILITOT 0.5 10/16/2018 0272       Impression and Plan: Ms. Coglianese is a very pleasant 63 yo African American female with iron deficiency anemia secondary to malabsorption since laparoscopic gastric bypass in 2016.  Iron saturation is 12% and ferritin 12. She will get IV iron today and 2 more doses over the next couple weeks.  Follow-up in 1 year.    Kennard Pea, NP 6/2/20251:00 PM

## 2023-07-29 NOTE — Patient Instructions (Signed)

## 2023-07-30 DIAGNOSIS — N393 Stress incontinence (female) (male): Secondary | ICD-10-CM | POA: Diagnosis not present

## 2023-07-30 LAB — IRON AND IRON BINDING CAPACITY (CC-WL,HP ONLY)
Iron: 45 ug/dL (ref 28–170)
Saturation Ratios: 10 % — ABNORMAL LOW (ref 10.4–31.8)
TIBC: 451 ug/dL — ABNORMAL HIGH (ref 250–450)
UIBC: 406 ug/dL (ref 148–442)

## 2023-07-30 NOTE — Progress Notes (Addendum)
 Patient Care Team: [name] Evener, MD as PCP - General Jacqueline Matsu, MD as PCP - Cardiology (Cardiology) Dorothe Gaster, RD as Dietitian (Family Medicine)  Visit Date: 08/02/23  Subjective:   Chief Complaint  Patient presents with   lab results   Follow-up   Vitals:   08/02/23 1020  BP: 110/80   Patient UE:AVWUJW Victoria [name], Victoria [name] DOB:01/21/1961,62 y.o. JXB:[id number]   63 y.o.Female presents today for 1 month follow-up for Iron  Deficiency Anemia/Benign Ethnic Neutropenia. Having multi -joint pain , malaise and fatigue. Complaining of easy bruisability as well. Checking labs for SLE. Longstanding hx of iron  deficiency likely due to malabsorption from gastric bypass surgery. Complains of no energy also. Recently seen by Cardiologist.  No significant change from last Echo in 2023.D Echo showed mild mitral insufficiency with normal Ejection fraction. Cardiology recommends repeat Echo in 2 years. Had IV iron  infusion June 2nd at Oncology.  Labs 5/12 - 13/2025:  (WNL) Brain Natriuretic Peptide: 64  (WNL) Vitamin B12: 386   (WNL) Folate: 7.4  (WNL) Vitamin D : 51  Sed Rate: elevated at 43  ANA Titer: POSITIVE  (WNL) Rheumatoid Factor: 10  (WNL) Total CK: 77  (WNL) Magnesium : 2.4  Iron /TIBC/Ferritin Panel: Iron  WNL at 49; TIBC WNL at 396; %SAT reduced at 12; Ferritin reduced at 12. Regarding these results a referral has been placed for Dr. Maria Shiner, Hematologist. She is S/p gastric bypass, causing some iron  malabsorption   07/15/2023 C-MET: Glucose 101; GFR 49.98  History of Iron  Deficiency Anemia, labs above for review, and she says she started receiving iron  transfusions on 6/02 with 2 subsequent transfusions scheduled.   History of Sleep Apnea managed with CPAP nightly.   History of Palpitations; Non-sustained Atrial Tachycardia treated with Flecainide  50 mg daily, Metoprolol  succinate 37.5 mg daily. Dependent Edema treated with Lasix  40 mg 3x/ week as needed.  Blood Pressure today 110/80. Was seen in th ED for Elevated D-Dimer on 06/12/2023, she is being followed by Dr. Gaylyn Keas, Cardiologist, and says that she is scheduled to see an Electrophysiologist on 6/11, A-fib clinic on 6/13, and is scheduled for a stress test in July. 07/15/2023 2D echo showed normal pumping function of the heart, EF 70-75% w/ mild leaky mitral valve, and mild to moderate leakiness of tricuspid valve, otherwise normal compared to 2023 echo. She says that she does still experience mild SOB.   Past Medical History:  Diagnosis Date   Anxiety    Back pain    Bursitis    right shoulder   Chronic kidney disease    hematuria, has been worked up, her normal   Chronic leukopenia    intermittant since 2010, followed by Dr. Liane Redman now   Chronic neck pain    Constipation    Degenerative joint disease of low back 09/02/2015   Dr. Rossie Coon   Displacement of lumbar intervertebral disc    Dysrhythmia    Tachycardia, PVCs, PACs   Edema of both lower legs    Fibrocystic breast    Gallbladder problem    GERD (gastroesophageal reflux disease)    Headache    per pt more stress/tension   Heart palpitations    SEE EPIC ENCOUTNER , CARDIOLOGY DR. Sophia Dustman TURNER 2018; reports on 05-16-17 " i haven't had any bouts of those lately"     Hemorrhoids    Hidradenitis suppurativa    History of Clostridium difficile infection 12/2010   History of exercise intolerance    ETT on 04-27-2016--  negative ([facility] treadmill score 7)   History of Helicobacter pylori infection 2001 and 09/ 2012   HSV-2 infection    genital   Hx of adenomatous colonic polyps 10/17/2007   Hydradenitis    per pt currently treated with Humira  injection   Hypertension    Hyperthyroidism    Hypocupremia    Hypothyroidism    Hypothyroidism, postsurgical 1980   IBS (irritable bowel syndrome)    Insomnia    Iron  deficiency    Joint pain    Leukopenia    Lumbosacral radiculopathy    Lymphocytic colitis 05/14/2022   dx  from colonoscopy   Medial meniscus tear    right knee   Mild obstructive sleep apnea    Mitral regurgitation    Mild by echo 06/2023   Numbness and tingling of right arm    Obesity    Osteoarthritis    Palpitations    Pernicious anemia    b12 def   Peroneal neuropathy    PONV (postoperative nausea and vomiting)    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   Prediabetes    Reactive hypoglycemia followed by pcp   post gastrectomy dumping syndrome   SOB (shortness of breath)    Spinal stenosis    Tendonitis    right shoulder   Tricuspid regurgitation    Mild to moderate echo 06/2023   Varicose veins    Vertigo    Vitamin B 12 deficiency    Vitamin D  deficiency     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    Family History  Problem Relation Age of Onset   High blood pressure Mother    Hypertension Mother    Headache Mother    High Cholesterol Mother    Thyroid  disease Mother    Obesity Mother    Kidney disease Mother    Hypertension Father    Diabetes Father    High blood pressure Father    High Cholesterol Father    Heart disease Father    Obesity Father    Diabetes Sister    Hypertension Sister    Thyroid  disease Brother    Thyroid  disease Maternal Aunt    Migraines Neg Hx    Social History   Social History Narrative   Lives at home with spouse   Right handed   Caffeine: diet zero mtn dew, occasionally     2025 - 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 Divorced, remarried - husband is disabled. 2 adult twin daughters. Non-smoker or drinker.   Review of Systems  Constitutional:  Positive for malaise/fatigue.  Respiratory:  Positive for shortness of breath (mild).   All other systems reviewed and are negative.    Objective:  Vitals: BP 110/80   Pulse 60   Ht 5\' 7"  (1.702 m)   Wt (!) 305 lb (138.3 kg)   SpO2 98%    BMI 47.77 kg/m   Physical Exam Vitals and nursing note reviewed.  Constitutional:      General: She is not in acute distress.    Appearance: Normal appearance. She is not toxic-appearing.  HENT:     Head: Normocephalic and atraumatic.  Cardiovascular:     Rate and Rhythm: Normal rate and regular rhythm. No extrasystoles are present.    Pulses: Normal pulses.     Heart sounds: Normal heart  sounds. No murmur heard.    No friction rub. No gallop.  Pulmonary:     Effort: Pulmonary effort is normal. No respiratory distress.     Breath sounds: Normal breath sounds. No wheezing or rales.  Skin:    General: Skin is warm and dry.  Neurological:     Mental Status: She is alert and oriented to person, place, and time. Mental status is at baseline.  Psychiatric:        Mood and Affect: Mood normal.        Behavior: Behavior normal.        Thought Content: Thought content normal.        Judgment: Judgment normal.     Results:  Studies Obtained And Personally Reviewed By Me:  2D Echo, 3D Echo/Strain Analysis (Both Spectral And Color Flow Doppler Utilized During Procedure) 5/ 19/ 2025   IMPRESSIONS  1. Left ventricular ejection fraction, by estimation, is 70 to 75% . The left ventricle has hyperdynamic function. The left ventricle has no regional wall motion abnormalities. Left ventricular diastolic parameters were normal. The average left ventricular global longitudinal strain is 18. 6 % . The global longitudinal strain is normal.   2. Right ventricular systolic function is normal. The right ventricular size is normal.   3. The mitral valve is normal in structure. Mild mitral valve regurgitation.   4. Tricuspid valve regurgitation is mild to moderate.   5. The aortic valve is tricuspid. Aortic valve regurgitation is not visualized.   6. The inferior vena cava is normal in size with greater than 50% respiratory variability, suggesting right atrial pressure of 3 mmHg.   Labs:      Component Value Date/Time   NA 143 07/15/2023 1353   NA 144 09/03/2022 1425   K 4.2 07/15/2023 1353   CL 109 07/15/2023 1353   CO2 26 07/15/2023 1353   GLUCOSE 101 (H) 07/15/2023 1353   BUN 18 07/15/2023 1353   BUN 9 09/03/2022 1425   CREATININE 1.17 07/15/2023 1353   CREATININE 1.23 (H) 07/09/2022 1132   CALCIUM  9.0 07/15/2023 1353   PROT 7.5 07/04/2023 1206   PROT 7.8 09/03/2022 1425   ALBUMIN 4.0 07/04/2023 1206   ALBUMIN 4.2 09/03/2022 1425   AST 16 07/04/2023 1206   AST 26 10/16/2018 0903   ALT 21 07/04/2023 1206   ALT 26 10/16/2018 0903   ALKPHOS 57 07/04/2023 1206   BILITOT 0.5 07/04/2023 1206   BILITOT 0.2 09/03/2022 1425   BILITOT 0.5 10/16/2018 0903   GFRNONAA 51 (L) 06/12/2023 1810   GFRNONAA 93 06/30/2020 1105   GFRAA 108 06/30/2020 1105    Lab Results  Component Value Date   WBC 6.4 07/29/2023   HGB 10.7 (L) 07/29/2023   HCT 33.0 (L) 07/29/2023   MCV 96.8 07/29/2023   PLT 236 07/29/2023   Lab Results  Component Value Date   CHOL 220 (H) 07/04/2023   HDL 89.40 07/04/2023   LDLCALC 116 (H) 07/04/2023   TRIG 73.0 07/04/2023   CHOLHDL 2 07/04/2023   Lab Results  Component Value Date   HGBA1C 6.0 07/04/2023    Lab Results  Component Value Date   TSH 0.84 07/04/2023    Assessment & Plan:   Orders Placed This Encounter  Procedures   Pneumococcal conjugate vaccine 20-valent (Prevnar 20)   ANA   Protime-INR   APTT   Reviewed Labs 5/12 - 13/2025:  (WNL) Brain Natriuretic Peptide: 64 (WNL) Vitamin B12: 386  (WNL)  Folate: 7.4 (WNL) Vitamin D : 51 Sed Rate: elevated at 43 (WNL) Rheumatoid Factor: 10 (WNL) Total CK: 77 (WNL) Magnesium : 2.4 Iron /TIBC/Ferritin Panel: Iron  WNL at 49; TIBC WNL at 396; %SAT reduced at 12; Ferritin reduced at 12.  07/15/2023 C-MET: Glucose 101; GFR 49.98  Malaise, Fatigue, Multi joint pain- unable to do much activity at all and previously worked as an Glass blower/designer at [employer]. Trying to work 1 or 2 days a week for  Dr. Tami Falcon but gets fatigued.  ANA Titer: POSITIVE on 5/13 - and again today.  High titter 1:320 with Nuclear speckled pattern. Possible SLE-Adding anti-Smith and anti-DNA antibodies to labs. Needs Rheumatology referral.May have SLE.  S/P Gastric Bypass; Iron  Deficiency Anemia, labs above for review, and she says she started receiving iron  transfusions on 6/02 with 2 subsequent transfusions scheduled. Followed by Dr. Maria Shiner, Hematologist. Ordering APTT and Protime-INR today. Iron  deficiency is due to gastric bypass surgery and maybe poor dietary iron  intake.  Sleep Apnea managed with CPAP nightly.   Myalgias malaise and fatigue-  Needs Rheumatology evaluation. Cardiac status is stable. Has sleep apnea device. Having a lot of joint pain and looks miserable.  Hx of thyroidectomy- TSH was low in April but in May was normal. Currently on Levothyroxine  0.112 mg daily  Palpitations; Non-sustained Atrial Tachycardia treated with Flecainide  50 mg daily, Metoprolol  succinate 37.5 mg daily.  Dependent Edema treated with Lasix  40 mg 3x/ week as needed. Blood Pressure today 110/80.   Shortness of Breath: seen in th ED for Elevated D-Dimer on 06/12/2023, she is being followed by Dr. Gaylyn Keas, Cardiologist, and says that she is scheduled to see an Electrophysiologist on 6/11, A-fib clinic on 6/13, and is scheduled for a stress test in July. 07/15/2023 2D echo showed normal pumping function of the heart, EF 70-75% w/ mild leaky mitral valve, and mild to moderate leakiness of tricuspid valve, otherwise normal compared to 2023 echo. She is still experiencing some mild SOB.   Impaired Glucose Tolerance w/ 07/15/2023 Glucose 101, elevated from 75 on 07/04/2023.   Vaccine Counseling: received PNA vaccine today.    I,Emily Lagle,acting as a Neurosurgeon for [name] Evener, MD.,have documented all relevant documentation on the behalf of [name] Evener, MD,as directed by  [name] Evener, MD while in the presence of [name] Evener, MD  I, [name] Evener, MD, have reviewed all documentation for this visit. The documentation on 08/04/23 for the exam, diagnosis, procedures, and orders are all accurate and complete.

## 2023-08-02 ENCOUNTER — Ambulatory Visit (INDEPENDENT_AMBULATORY_CARE_PROVIDER_SITE_OTHER): Admitting: Internal Medicine

## 2023-08-02 ENCOUNTER — Telehealth: Payer: Self-pay | Admitting: Internal Medicine

## 2023-08-02 ENCOUNTER — Encounter: Payer: Self-pay | Admitting: Internal Medicine

## 2023-08-02 VITALS — BP 110/80 | HR 60 | Ht 67.0 in | Wt 305.0 lb

## 2023-08-02 DIAGNOSIS — M2550 Pain in unspecified joint: Secondary | ICD-10-CM | POA: Diagnosis not present

## 2023-08-02 DIAGNOSIS — R0602 Shortness of breath: Secondary | ICD-10-CM

## 2023-08-02 DIAGNOSIS — R7302 Impaired glucose tolerance (oral): Secondary | ICD-10-CM | POA: Diagnosis not present

## 2023-08-02 DIAGNOSIS — I4719 Other supraventricular tachycardia: Secondary | ICD-10-CM | POA: Diagnosis not present

## 2023-08-02 DIAGNOSIS — R5381 Other malaise: Secondary | ICD-10-CM

## 2023-08-02 DIAGNOSIS — E039 Hypothyroidism, unspecified: Secondary | ICD-10-CM

## 2023-08-02 DIAGNOSIS — R768 Other specified abnormal immunological findings in serum: Secondary | ICD-10-CM

## 2023-08-02 DIAGNOSIS — Z9889 Other specified postprocedural states: Secondary | ICD-10-CM

## 2023-08-02 DIAGNOSIS — Z9884 Bariatric surgery status: Secondary | ICD-10-CM | POA: Diagnosis not present

## 2023-08-02 DIAGNOSIS — E611 Iron deficiency: Secondary | ICD-10-CM

## 2023-08-02 DIAGNOSIS — D509 Iron deficiency anemia, unspecified: Secondary | ICD-10-CM | POA: Diagnosis not present

## 2023-08-02 DIAGNOSIS — Z23 Encounter for immunization: Secondary | ICD-10-CM

## 2023-08-02 DIAGNOSIS — E538 Deficiency of other specified B group vitamins: Secondary | ICD-10-CM

## 2023-08-02 DIAGNOSIS — R233 Spontaneous ecchymoses: Secondary | ICD-10-CM | POA: Diagnosis not present

## 2023-08-02 DIAGNOSIS — G4733 Obstructive sleep apnea (adult) (pediatric): Secondary | ICD-10-CM

## 2023-08-02 DIAGNOSIS — G4730 Sleep apnea, unspecified: Secondary | ICD-10-CM | POA: Diagnosis not present

## 2023-08-02 MED ORDER — HYDROCODONE-ACETAMINOPHEN 10-325 MG PO TABS: ORAL_TABLET | ORAL | 0 refills | Status: AC

## 2023-08-02 NOTE — Telephone Encounter (Signed)
 I called and lvm to try and schedule lab appointment a few days prior to her 6 month follow up in November

## 2023-08-04 ENCOUNTER — Ambulatory Visit: Payer: Self-pay | Admitting: Internal Medicine

## 2023-08-04 NOTE — Patient Instructions (Addendum)
 You have a high titer ANA rasing suspicion for lupus or other connective tissue disorder.  We are Adding additional labs and I am referring you to Rheumatology. Continue C-Pap use and IV iron  treatments.

## 2023-08-05 ENCOUNTER — Inpatient Hospital Stay

## 2023-08-05 ENCOUNTER — Other Ambulatory Visit: Payer: Self-pay

## 2023-08-05 VITALS — BP 130/67 | HR 57 | Temp 97.6°F | Resp 17

## 2023-08-05 DIAGNOSIS — R768 Other specified abnormal immunological findings in serum: Secondary | ICD-10-CM

## 2023-08-05 DIAGNOSIS — R5381 Other malaise: Secondary | ICD-10-CM

## 2023-08-05 DIAGNOSIS — D508 Other iron deficiency anemias: Secondary | ICD-10-CM

## 2023-08-05 DIAGNOSIS — K909 Intestinal malabsorption, unspecified: Secondary | ICD-10-CM | POA: Diagnosis not present

## 2023-08-05 DIAGNOSIS — M2550 Pain in unspecified joint: Secondary | ICD-10-CM

## 2023-08-05 DIAGNOSIS — D509 Iron deficiency anemia, unspecified: Secondary | ICD-10-CM | POA: Diagnosis not present

## 2023-08-05 MED ORDER — SODIUM CHLORIDE 0.9 % IV SOLN: Freq: Once | INTRAVENOUS | Status: AC

## 2023-08-05 MED ORDER — SODIUM CHLORIDE 0.9 % IV SOLN
300.0000 mg | Freq: Once | INTRAVENOUS | Status: AC
  Administered 2023-08-05: 300 mg via INTRAVENOUS
  Filled 2023-08-05: qty 300

## 2023-08-05 NOTE — Patient Instructions (Signed)

## 2023-08-05 NOTE — Addendum Note (Signed)
 Addended by: Jasimine Simms P on: 08/05/2023 10:39 AM   Modules accepted: Orders

## 2023-08-06 NOTE — Progress Notes (Signed)
 Electrophysiology Office Note:    Date:  08/07/2023   ID:  Victoria [name], DOB 1960/06/15, MRN [id number]  PCP:  [name] Evener, MD   [facility] Providers Cardiologist:  Gaylyn Keas, MD     Referring MD: Jacqueline Matsu, MD   History of Present Illness:    Victoria [name] is a 63 y.o. female with a medical history significant for obstructive sleep apnea, GERD, referred for evaluation and management of arrhythmia.     A monitor in 2022 showed a 5% burden of PACs sometimes occurring in runs.  She had a knee replacement last year and is since gained about 40 pounds.  She is also struggling with lymphocytic colitis and is on immune suppressants.  She has had progressive aching, jaw pain.  She has noticed episodes of heart racing.  She was prescribed flecainide  for palpitations due to PVCs, PACs and nonsustained atrial tachycardia and nonsustained ventricular tachycardia.  Discussed the use of AI scribe software for clinical note transcription with the patient, who gave verbal consent to proceed.  History of Present Illness Victoria [name] is a 63 year old female with arrhythmia who presents for evaluation of arrhythmia. She was referred by Doctor Micael Adas for evaluation of arrhythmia.  In 2022, she was diagnosed with arrhythmia when a monitor revealed a five percent burden of premature atrial contractions (PACs), sometimes occurring in runs. She was prescribed flecainide , which she feels alleviated her symptoms. In April 2025, a subsequent monitor showed rare atrial activity with less than one percent burden and occasional runs. Despite experiencing multiple symptom episodes, these were primarily associated with sinus rhythm.  She experiences fatigue and shortness of breath, significantly impacting her daily activities, such as taking a shower. She also reports palpitations, primarily occurring when lying down, which are brief in duration.  Her  past medical history includes obstructive sleep apnea, GERD, and lymphocytic colitis, for which she is on immunosuppressants. She has had both knees replaced, leading to a period of reduced activity and subsequent weight gain of about forty pounds.  Current medications include flecainide  and metoprolol .         Today, she reports that she feels reasonably well. She walked a mile this morning.  EKGs/Labs/Other Studies Reviewed Today:     Echocardiogram:  TTE Jul 15, 2023 LVEF 70 to 75%.  Mild MR, mild-moderate TR   Monitors:  13 day monitor April 2025-- my interpretation Sinus rhythm: HR 46-129 bpm, avg 69 Rare ectopy, < 1% 26 atrial runs, average rate 118 bpm.  The longest run lasted 10 seconds and was not associated with symptoms. Multiple symptom episodes reported -- lightheadedness, shortness of breath associated with sinus rhythm.  There were occasional isolated PVCs and brief atrial runs.   Advanced imaging:  CT coronary February 2022 Neri calcium  score of 0   EKG:         Physical Exam:    VS:  BP 102/80   Pulse 61   Ht 5' 7 (1.702 m)   Wt (!) 301 lb 9.6 oz (136.8 kg)   SpO2 99%   BMI 47.24 kg/m     Wt Readings from Last 3 Encounters:  08/07/23 (!) 301 lb 9.6 oz (136.8 kg)  08/02/23 (!) 305 lb (138.3 kg)  07/29/23 (!) 309 lb (140.2 kg)     GEN: Well nourished, well developed in no acute distress CARDIAC: RRR, no murmurs, rubs, gallops RESPIRATORY:  Normal work of breathing MUSCULOSKELETAL: no  edema    ASSESSMENT & PLAN:     Atrial ectopy Current burden is less than 1% I do not think this can be improved upon She reports that she is not having significant palpitations Will discontinue flecainide  and monitor, since it is not giving her morbidity/mortality benefit She may resume the flecainide  if she has recurrence of symptoms She will follow-up in EP clinic with APP in 6 months.  She may benefit from a monitor at that time if she remains off  flecainide .  Obstructive sleep apnea Encourage CPAP use  Morbid obesity I encouraged her ongoing weight loss efforts      Signed, Efraim Grange, MD  08/07/2023 10:16 AM    [facility]

## 2023-08-07 ENCOUNTER — Encounter: Payer: Self-pay | Admitting: Internal Medicine

## 2023-08-07 ENCOUNTER — Encounter: Payer: Self-pay | Admitting: Cardiovascular Disease

## 2023-08-07 ENCOUNTER — Ambulatory Visit: Attending: Cardiovascular Disease | Admitting: Cardiovascular Disease

## 2023-08-07 ENCOUNTER — Other Ambulatory Visit: Payer: Self-pay

## 2023-08-07 VITALS — BP 102/80 | HR 61 | Ht 67.0 in | Wt 301.6 lb

## 2023-08-07 DIAGNOSIS — R002 Palpitations: Secondary | ICD-10-CM

## 2023-08-07 DIAGNOSIS — R768 Other specified abnormal immunological findings in serum: Secondary | ICD-10-CM

## 2023-08-07 DIAGNOSIS — R5381 Other malaise: Secondary | ICD-10-CM

## 2023-08-07 DIAGNOSIS — M2550 Pain in unspecified joint: Secondary | ICD-10-CM

## 2023-08-07 LAB — TEST AUTHORIZATION

## 2023-08-07 LAB — ANTI-NUCLEAR AB-TITER (ANA TITER): ANA Titer 1: 1:320 {titer} — ABNORMAL HIGH

## 2023-08-07 LAB — ANTI-DNA ANTIBODY, DOUBLE-STRANDED: ds DNA Ab: 2 [IU]/mL

## 2023-08-07 LAB — PROTIME-INR
INR: 1
Prothrombin Time: 10.4 s (ref 9.0–11.5)

## 2023-08-07 LAB — ANTI-SMITH ANTIBODY: ENA SM Ab Ser-aCnc: <1 AI

## 2023-08-07 LAB — APTT: aPTT: 27 s (ref 23–32)

## 2023-08-07 LAB — ANA: Anti Nuclear Antibody (ANA): POSITIVE — AB

## 2023-08-07 NOTE — Patient Instructions (Signed)
 Medication Instructions:  STOP Flecainide   *If you need a refill on your cardiac medications before your next appointment, please call your pharmacy*  Follow-Up: At [facility], you and your health needs are our priority.  As part of our continuing mission to provide you with exceptional heart care, our providers are all part of one team.  This team includes your primary Cardiologist (physician) and Advanced Practice Providers or APPs (Physician Assistants and Nurse Practitioners) who all work together to provide you with the care you need, when you need it.  Your next appointment:   6 month(s)  Provider:   You will see one of the following Advanced Practice Providers on your designated Care Team:   Mertha Abrahams, PA-C Michael Andy Tillery, PA-C Suzann Riddle, NP Creighton Doffing, NP

## 2023-08-09 ENCOUNTER — Ambulatory Visit (HOSPITAL_COMMUNITY): Admitting: Internal Medicine

## 2023-08-12 ENCOUNTER — Other Ambulatory Visit: Payer: Self-pay

## 2023-08-12 ENCOUNTER — Encounter: Payer: Self-pay | Admitting: Family

## 2023-08-12 ENCOUNTER — Inpatient Hospital Stay

## 2023-08-12 VITALS — BP 124/82 | HR 64 | Temp 97.7°F | Resp 18

## 2023-08-12 DIAGNOSIS — D508 Other iron deficiency anemias: Secondary | ICD-10-CM

## 2023-08-12 DIAGNOSIS — D509 Iron deficiency anemia, unspecified: Secondary | ICD-10-CM | POA: Diagnosis not present

## 2023-08-12 DIAGNOSIS — K909 Intestinal malabsorption, unspecified: Secondary | ICD-10-CM | POA: Diagnosis not present

## 2023-08-12 MED ORDER — SODIUM CHLORIDE 0.9 % IV SOLN
300.0000 mg | Freq: Once | INTRAVENOUS | Status: AC
  Administered 2023-08-12: 300 mg via INTRAVENOUS
  Filled 2023-08-12: qty 300

## 2023-08-12 MED ORDER — SODIUM CHLORIDE 0.9 % IV SOLN: Freq: Once | INTRAVENOUS | Status: AC

## 2023-08-12 NOTE — Patient Instructions (Signed)
 [facility] - A DEPT OF [facility]  Discharge Instructions: Thank you for choosing [facility] to provide your oncology and hematology care.   If you have a lab appointment with the Cancer Center, please go directly to the Cancer Center and check in at the registration area.  Wear comfortable clothing and clothing appropriate for easy access to any Portacath or PICC line.   We strive to give you quality time with your provider. You may need to reschedule your appointment if you arrive late (15 or more minutes).  Arriving late affects you and other patients whose appointments are after yours.  Also, if you miss three or more appointments without notifying the office, you may be dismissed from the clinic at the provider's discretion.      For prescription refill requests, have your pharmacy contact our office and allow 72 hours for refills to be completed.    Today you received the following  agents Venofer      To help prevent nausea and vomiting after your treatment, we encourage you to take your nausea medication as directed.  BELOW ARE SYMPTOMS THAT SHOULD BE REPORTED IMMEDIATELY: *FEVER GREATER THAN 100.4 F (38 C) OR HIGHER *CHILLS OR SWEATING *NAUSEA AND VOMITING THAT IS NOT CONTROLLED WITH YOUR NAUSEA MEDICATION *UNUSUAL SHORTNESS OF BREATH *UNUSUAL BRUISING OR BLEEDING *URINARY PROBLEMS (pain or burning when urinating, or frequent urination) *BOWEL PROBLEMS (unusual diarrhea, constipation, pain near the anus) TENDERNESS IN MOUTH AND THROAT WITH OR WITHOUT PRESENCE OF ULCERS (sore throat, sores in mouth, or a toothache) UNUSUAL RASH, SWELLING OR PAIN  UNUSUAL VAGINAL DISCHARGE OR ITCHING   Items with * indicate a potential emergency and should be followed up as soon as possible or go to the Emergency Department if any problems should occur.  Please show the CHEMOTHERAPY ALERT CARD or IMMUNOTHERAPY ALERT CARD at check-in to the  Emergency Department and triage nurse. Should you have questions after your visit or need to cancel or reschedule your appointment, please contact [facility] - A DEPT OF [facility]  [phone] and follow the prompts.  Office hours are 8:00 a.m. to 4:30 p.m. Monday - Friday. Please note that voicemails left after 4:00 p.m. may not be returned until the following business day.  We are closed weekends and major holidays. You have access to a nurse at all times for urgent questions. Please call the main number to the clinic [phone] and follow the prompts.  For any non-urgent questions, you may also contact your provider using MyChart. We now offer e-Visits for anyone 79 and older to request care online for non-urgent symptoms. For details visit [email or web address].   Also download the MyChart app! Go to the app store, search "MyChart", open the app, select , and log in with your MyChart username and password.

## 2023-08-13 ENCOUNTER — Other Ambulatory Visit: Payer: Self-pay | Admitting: Internal Medicine

## 2023-08-16 ENCOUNTER — Ambulatory Visit: Payer: 59 | Admitting: Family

## 2023-08-16 ENCOUNTER — Other Ambulatory Visit: Payer: 59

## 2023-08-27 ENCOUNTER — Other Ambulatory Visit: Payer: Self-pay | Admitting: Internal Medicine

## 2023-08-27 DIAGNOSIS — E8770 Fluid overload, unspecified: Secondary | ICD-10-CM

## 2023-09-03 DIAGNOSIS — R430 Anosmia: Secondary | ICD-10-CM | POA: Diagnosis not present

## 2023-09-03 DIAGNOSIS — R051 Acute cough: Secondary | ICD-10-CM | POA: Diagnosis not present

## 2023-09-03 DIAGNOSIS — J189 Pneumonia, unspecified organism: Secondary | ICD-10-CM | POA: Diagnosis not present

## 2023-09-03 DIAGNOSIS — R5383 Other fatigue: Secondary | ICD-10-CM | POA: Diagnosis not present

## 2023-09-03 DIAGNOSIS — J029 Acute pharyngitis, unspecified: Secondary | ICD-10-CM | POA: Diagnosis not present

## 2023-09-04 DIAGNOSIS — N393 Stress incontinence (female) (male): Secondary | ICD-10-CM | POA: Diagnosis not present

## 2023-09-05 ENCOUNTER — Encounter: Payer: Self-pay | Admitting: Obstetrics and Gynecology

## 2023-09-05 ENCOUNTER — Ambulatory Visit (INDEPENDENT_AMBULATORY_CARE_PROVIDER_SITE_OTHER): Admitting: Internal Medicine

## 2023-09-05 VITALS — BP 120/80 | HR 66 | Temp 99.2°F | Ht 67.0 in | Wt 301.0 lb

## 2023-09-05 DIAGNOSIS — E611 Iron deficiency: Secondary | ICD-10-CM

## 2023-09-05 DIAGNOSIS — R5381 Other malaise: Secondary | ICD-10-CM | POA: Diagnosis not present

## 2023-09-05 DIAGNOSIS — J189 Pneumonia, unspecified organism: Secondary | ICD-10-CM | POA: Diagnosis not present

## 2023-09-05 DIAGNOSIS — Z9889 Other specified postprocedural states: Secondary | ICD-10-CM | POA: Diagnosis not present

## 2023-09-05 DIAGNOSIS — Z8639 Personal history of other endocrine, nutritional and metabolic disease: Secondary | ICD-10-CM | POA: Diagnosis not present

## 2023-09-05 DIAGNOSIS — Z9089 Acquired absence of other organs: Secondary | ICD-10-CM | POA: Diagnosis not present

## 2023-09-05 DIAGNOSIS — G4733 Obstructive sleep apnea (adult) (pediatric): Secondary | ICD-10-CM | POA: Diagnosis not present

## 2023-09-05 DIAGNOSIS — R531 Weakness: Secondary | ICD-10-CM

## 2023-09-05 DIAGNOSIS — E039 Hypothyroidism, unspecified: Secondary | ICD-10-CM

## 2023-09-05 DIAGNOSIS — R5383 Other fatigue: Secondary | ICD-10-CM | POA: Diagnosis not present

## 2023-09-05 DIAGNOSIS — Z9884 Bariatric surgery status: Secondary | ICD-10-CM

## 2023-09-05 MED ORDER — CEFTRIAXONE SODIUM 1 G IJ SOLR
1.0000 g | Freq: Once | INTRAMUSCULAR | Status: AC
  Administered 2023-09-05: 1 g via INTRAMUSCULAR

## 2023-09-05 NOTE — Patient Instructions (Addendum)
 Labs drawn and pending.Given Rocephin  in office. Take Augmentin and Doxycycline . CBC and C-met results pending.Follow up here in 2 weeks.

## 2023-09-05 NOTE — Progress Notes (Addendum)
 Patient Care Team: Perri Ronal PARAS, MD as PCP - General Shlomo Wilbert SAUNDERS, MD as PCP - Cardiology (Cardiology) Mealor, Eulas BRAVO, MD as PCP - Electrophysiology (Cardiology) Wonda Cy BROCKS, RD as Dietitian (Family Medicine)  Visit Date: 09/05/23  Subjective:   Chief Complaint  Patient presents with   Pneumonia    Patient went to [facility] on 09/03/23 and she tested positive for pneumonia,    Patient PI:Dyzopj Minka, Knight DOB:17-Nov-1960,62 y.o. FMW:[id number]   63 y.o.Female presents today for acute sick visit with PNA. Patient has a past medical history of Insomnia. She says her symptoms began prior to July 4th, with fatigue and 1 episode of coughing which woke her up, and she felt worse by 7/07, but did test herself for Covid-19 at home with a negative result. On 7/08 she says she was seen at Urgent Care where she was diagnosed with PNA and prescribed Prednisone  x5 days, Phenergan , Augmentin, and Doxycycline  - started both Augmentin and Doxycycline  on Tuesday.    Regarding her persisting dysphonia, she says that when she was recently seen there was mild inflammation within her throat but was recommended that she see an Freight forwarder, which she is seeing 7/23. Mentions her sister experienced a similar issue which resolved s/p tonsillectomy.   Hx of Insomnia which she says she is still struggling with. In the past has tried: Xanax  and Ativan , which she says worked better than what she mostly recently tried, that being Ambien  and Lunesta  which did not work and actually kept her awake. Past Medical History:  Diagnosis Date   Anxiety    Back pain    Bursitis    right shoulder   Chronic kidney disease    hematuria, has been worked up, her normal   Chronic leukopenia    intermittant since 2010, followed by Dr. Perri now   Chronic neck pain    Constipation    Degenerative joint disease of low back 09/02/2015   Dr. Hiram   Displacement of lumbar  intervertebral disc    Dysrhythmia    Tachycardia, PVCs, PACs   Edema of both lower legs    Fibrocystic breast    Gallbladder problem    GERD (gastroesophageal reflux disease)    Headache    per pt more stress/tension   Heart palpitations    SEE EPIC ENCOUTNER , CARDIOLOGY DR. WILBERT TURNER 2018; reports on 05-16-17  i haven't had any bouts of those lately     Hemorrhoids    Hidradenitis suppurativa    History of Clostridium difficile infection 12/2010   History of exercise intolerance    ETT on 04-27-2016-- negative ([facility] treadmill score 7)   History of Helicobacter pylori infection 2001 and 09/ 2012   HSV-2 infection    genital   Hx of adenomatous colonic polyps 10/17/2007   Hydradenitis    per pt currently treated with Humira  injection   Hypertension    Hyperthyroidism    Hypocupremia    Hypothyroidism    Hypothyroidism, postsurgical 1980   IBS (irritable bowel syndrome)    Insomnia    Iron  deficiency    Joint pain    Leukopenia    Lumbosacral radiculopathy    Lymphocytic colitis 05/14/2022   dx from colonoscopy   Medial meniscus tear    right knee   Mild obstructive sleep apnea    Mitral regurgitation    Mild by echo 06/2023   Numbness and tingling of right arm  Obesity    Osteoarthritis    Palpitations    Pernicious anemia    b12 def   Peroneal neuropathy    PONV (postoperative nausea and vomiting)    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   Prediabetes    Reactive hypoglycemia followed by pcp   post gastrectomy dumping syndrome   SOB (shortness of breath)    Spinal stenosis    Tendonitis    right shoulder   Tricuspid regurgitation    Mild to moderate echo 06/2023   Varicose veins    Vertigo    Vitamin B 12 deficiency    Vitamin D  deficiency     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    Family History  Problem Relation Age of Onset   High blood pressure Mother     Hypertension Mother    Headache Mother    High Cholesterol Mother    Thyroid  disease Mother    Obesity Mother    Kidney disease Mother    Hypertension Father    Diabetes Father    High blood pressure Father    High Cholesterol Father    Heart disease Father    Obesity Father    Diabetes Sister    Hypertension Sister    Thyroid  disease Brother    Thyroid  disease Maternal Aunt    Migraines Neg Hx    Social History   Social History Narrative   Lives at home with spouse   Right handed   Caffeine: diet zero mtn dew, occasionally     2025 - 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 Divorced, remarried - husband is disabled. 2 adult twin daughters. Non-smoker or drinker.    Review of Systems  Constitutional:  Positive for malaise/fatigue. Negative for chills and fever.  HENT:         (+) Dysphonia  Respiratory:  Positive for cough.   Psychiatric/Behavioral:  The patient has insomnia.   All other systems reviewed and are negative.    Objective:  Vitals: BP 120/80   Pulse 66   Temp 99.2 F (37.3 C)   Ht 5' 7 (1.702 m)   Wt (!) 301 lb (136.5 kg)   SpO2 98%   BMI 47.14 kg/m   Physical Exam Vitals and nursing note reviewed.  Constitutional:      General: She is not in acute distress.    Appearance: Normal appearance. She is not toxic-appearing.     Comments: Dysphonia noted  HENT:     Head: Normocephalic and atraumatic.  Pulmonary:     Effort: Pulmonary effort is normal.  Skin:    General: Skin is warm and dry.  Neurological:     Mental Status: She is alert and oriented to person, place, and time. Mental status is at baseline.  Psychiatric:        Mood and Affect: Mood normal.        Behavior: Behavior normal.        Thought Content: Thought content normal.        Judgment: Judgment normal.   She has bibasilar rales Results:  Studies Obtained And Personally Reviewed By Me: Labs:     Component  Value Date/Time   NA 143 07/15/2023 1353   NA 144 09/03/2022 1425   K 4.2 07/15/2023 1353   CL 109 07/15/2023 1353  CO2 26 07/15/2023 1353   GLUCOSE 101 (H) 07/15/2023 1353   BUN 18 07/15/2023 1353   BUN 9 09/03/2022 1425   CREATININE 1.17 07/15/2023 1353   CREATININE 1.23 (H) 07/09/2022 1132   CALCIUM  9.0 07/15/2023 1353   PROT 7.5 07/04/2023 1206   PROT 7.8 09/03/2022 1425   ALBUMIN 4.0 07/04/2023 1206   ALBUMIN 4.2 09/03/2022 1425   AST 16 07/04/2023 1206   AST 26 10/16/2018 0903   ALT 21 07/04/2023 1206   ALT 26 10/16/2018 0903   ALKPHOS 57 07/04/2023 1206   BILITOT 0.5 07/04/2023 1206   BILITOT 0.2 09/03/2022 1425   BILITOT 0.5 10/16/2018 0903   GFRNONAA 51 (L) 06/12/2023 1810   GFRNONAA 93 06/30/2020 1105   GFRAA 108 06/30/2020 1105    Lab Results  Component Value Date   WBC 6.4 07/29/2023   HGB 10.7 (L) 07/29/2023   HCT 33.0 (L) 07/29/2023   MCV 96.8 07/29/2023   PLT 236 07/29/2023   Lab Results  Component Value Date   CHOL 220 (H) 07/04/2023   HDL 89.40 07/04/2023   LDLCALC 116 (H) 07/04/2023   TRIG 73.0 07/04/2023   CHOLHDL 2 07/04/2023   Lab Results  Component Value Date   HGBA1C 6.0 07/04/2023    Lab Results  Component Value Date   TSH 0.84 07/04/2023    Assessment & Plan:   Orders Placed This Encounter  Procedures   CBC with Differential/Platelet   COMPLETE METABOLIC PANEL WITHOUT GFR   Meds ordered this encounter  Medications   cefTRIAXone  (ROCEPHIN ) injection 1 g  Community acquired pneumonia- dx at urgent care. Pneumonia: symptoms began prior to July 4th, with fatigue and 1 episode of coughing which woke her up, and she felt worse by 7/07, but did test herself for Covid-19 at home with a negative result. On 7/08 she says she was seen at Urgent Care (reviewed) where she was diagnosed with PNA and prescribed Prednisone  x5 days, Phenergan , Augmentin, and Doxycycline  - started both Augmentin and Doxycycline  on Tuesday. Continue Augmentin &  Doxycycline . Given Rocephin  1 g IM in-office today. Ordering CBC and C-MET. Stay well rested, well hydrated, and well nourished. Walk around some to prevent atelectasis. Contact us  if symptoms worsen/persist despite treatment. CXR report from Urgent Care is not available to us  today. Will need follow up CXR in 2 weeks. Records reviewed from [facility] on [facility] says in-clinic reading of CXR pending. Covid test was negative. Has PET scheduled for July 22.  Regarding her persisting Dysphonia, when she was recently seen there was mild inflammation within her throat but was recommended that she see an Freight forwarder, which she is seeing 7/23. Apparent FHx of dysphonia in sister, which resolved s/p tonsillectomy.  Iron  deficiency s/p gastric bypass surgery- seen at [facility] for iron  infusions Last was June 9th. Retic count normal at 1.4 %  Lymphocytic colitis seen by Dr. Kristie July 10th. Low fat high fiber diet recommended  Iron  deficiency-- iron  level July 29, 2023 was 45- and normal is  28-170. Had iron  infusion June 16th at [facility]  Fatigue- has positive ANA titer 1:320 nuclear speckled pattern with negative anti DNA antibody and negative Smith antibody on August 02, 2023. 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 Mild bronchial wall thickening. Mild elevation of left hemidiaphragm.  Insomnia persisting. In the past has tried:  Xanax  and Ativan , which she says worked better than what she mostly recently tried, that being Ambien  and Lunesta  which did not work and actually kept her awake. She does still have some left-over Ativan , which we discussed she can take for insomnia if it provided significant relief for her in the past. She may take 1 mg in the morning and 2 mg at night.  Fatigue- we have been trying to figure this out for sometime. Has cardiac work up  pending. Likely multifactorial.    I,Emily Lagle,acting as a scribe for Ronal JINNY Hailstone, MD.,have documented all relevant documentation on the behalf of Ronal JINNY Hailstone, MD,as directed by  Ronal JINNY Hailstone, MD .  I, Ronal JINNY Hailstone, MD, have reviewed all documentation for this visit. The documentation on 09/05/23 for the exam, diagnosis, procedures, and orders are all accurate and complete.

## 2023-09-06 ENCOUNTER — Ambulatory Visit: Payer: Self-pay | Admitting: Internal Medicine

## 2023-09-06 LAB — CBC WITH DIFFERENTIAL/PLATELET
Absolute Lymphocytes: 1255 {cells}/uL (ref 850–3900)
Absolute Monocytes: 424 {cells}/uL (ref 200–950)
Basophils Absolute: 8 {cells}/uL (ref 0–200)
Basophils Relative: 0.1 %
Eosinophils Absolute: 0 {cells}/uL — ABNORMAL LOW (ref 15–500)
Eosinophils Relative: 0 %
HCT: 39.3 % (ref 35.0–45.0)
Hemoglobin: 12.5 g/dL (ref 11.7–15.5)
MCH: 31.7 pg (ref 27.0–33.0)
MCHC: 31.8 g/dL — ABNORMAL LOW (ref 32.0–36.0)
MCV: 99.7 fL (ref 80.0–100.0)
MPV: 11.9 fL (ref 7.5–12.5)
Monocytes Relative: 5.5 %
Neutro Abs: 6014 {cells}/uL (ref 1500–7800)
Neutrophils Relative %: 78.1 %
Platelets: 226 Thousand/uL (ref 140–400)
RBC: 3.94 Million/uL (ref 3.80–5.10)
RDW: 14.3 % (ref 11.0–15.0)
Total Lymphocyte: 16.3 %
WBC: 7.7 Thousand/uL (ref 3.8–10.8)

## 2023-09-06 LAB — COMPLETE METABOLIC PANEL WITHOUT GFR
AG Ratio: 1.4 (calc) (ref 1.0–2.5)
ALT: 25 U/L (ref 6–29)
AST: 17 U/L (ref 10–35)
Albumin: 4.4 g/dL (ref 3.6–5.1)
Alkaline phosphatase (APISO): 62 U/L (ref 37–153)
BUN/Creatinine Ratio: 16 (calc) (ref 6–22)
BUN: 21 mg/dL (ref 7–25)
CO2: 26 mmol/L (ref 20–32)
Calcium: 9.8 mg/dL (ref 8.6–10.4)
Chloride: 104 mmol/L (ref 98–110)
Creat: 1.28 mg/dL — ABNORMAL HIGH (ref 0.50–1.05)
Globulin: 3.2 g/dL (ref 1.9–3.7)
Glucose, Bld: 104 mg/dL — ABNORMAL HIGH (ref 65–99)
Potassium: 4.7 mmol/L (ref 3.5–5.3)
Sodium: 142 mmol/L (ref 135–146)
Total Bilirubin: 0.3 mg/dL (ref 0.2–1.2)
Total Protein: 7.6 g/dL (ref 6.1–8.1)

## 2023-09-07 ENCOUNTER — Other Ambulatory Visit: Payer: Self-pay | Admitting: Nurse Practitioner

## 2023-09-07 DIAGNOSIS — E8770 Fluid overload, unspecified: Secondary | ICD-10-CM

## 2023-09-13 ENCOUNTER — Encounter (HOSPITAL_COMMUNITY): Payer: Self-pay

## 2023-09-13 ENCOUNTER — Other Ambulatory Visit: Payer: Self-pay | Admitting: Internal Medicine

## 2023-09-13 ENCOUNTER — Ambulatory Visit: Admitting: Internal Medicine

## 2023-09-13 ENCOUNTER — Ambulatory Visit: Payer: Self-pay | Admitting: Internal Medicine

## 2023-09-13 ENCOUNTER — Encounter: Payer: Self-pay | Admitting: Internal Medicine

## 2023-09-13 ENCOUNTER — Ambulatory Visit
Admission: RE | Admit: 2023-09-13 | Discharge: 2023-09-13 | Disposition: A | Discharge status: Home / Self Care | Source: Ambulatory Visit | Attending: Internal Medicine | Admitting: Internal Medicine

## 2023-09-13 VITALS — BP 100/80 | HR 72 | Ht 68.0 in | Wt 312.0 lb

## 2023-09-13 DIAGNOSIS — R5383 Other fatigue: Secondary | ICD-10-CM

## 2023-09-13 DIAGNOSIS — M2550 Pain in unspecified joint: Secondary | ICD-10-CM | POA: Diagnosis not present

## 2023-09-13 DIAGNOSIS — E039 Hypothyroidism, unspecified: Secondary | ICD-10-CM

## 2023-09-13 DIAGNOSIS — R5382 Chronic fatigue, unspecified: Secondary | ICD-10-CM | POA: Diagnosis not present

## 2023-09-13 DIAGNOSIS — J189 Pneumonia, unspecified organism: Secondary | ICD-10-CM | POA: Diagnosis not present

## 2023-09-13 DIAGNOSIS — G4733 Obstructive sleep apnea (adult) (pediatric): Secondary | ICD-10-CM

## 2023-09-13 DIAGNOSIS — R5381 Other malaise: Secondary | ICD-10-CM | POA: Diagnosis not present

## 2023-09-13 DIAGNOSIS — Z9889 Other specified postprocedural states: Secondary | ICD-10-CM | POA: Diagnosis not present

## 2023-09-13 DIAGNOSIS — R768 Other specified abnormal immunological findings in serum: Secondary | ICD-10-CM | POA: Diagnosis not present

## 2023-09-13 DIAGNOSIS — Z9089 Acquired absence of other organs: Secondary | ICD-10-CM

## 2023-09-13 NOTE — Progress Notes (Signed)
 Patient Care Team: Perri Ronal PARAS, MD as PCP - General Shlomo Wilbert SAUNDERS, MD as PCP - Cardiology (Cardiology) Mealor, Eulas BRAVO, MD as PCP - Electrophysiology (Cardiology) Wonda Cy BROCKS, RD as Dietitian (Family Medicine)  Visit Date: 09/13/23  Subjective:   Chief Complaint  Patient presents with   Pneumonia   Patient PI:Victoria [name], Victoria [name] DOB:1960/08/08,62 y.o. FMW:[id number]   63 y.o.Female presents today for  follow-up.  She was seen  here on July 10th  urgently for follow up of urgent care visit on July 8th where she was told she had pneumonia.I do not have CXR report from urgent care. At urgent care , she was prescribed  Prednisone  x 5 days, phenergan , Augmentin and doxycycline .   CBC here on July 10th was normal.Glucose non fasting was 104.Electrolytes and liver functions were normal. We gave her one gram of Rocephin  and advised her to take meds as prescribed from Urgent care.   She is having PET  CT cardiac perfusion scan ordered by Dr. Shlomo on July 22.  Patient has a past medical history of Iron  Deficiency s/p Gastric Bypass Surgery-   Retic count normal at 1.4 % and Iron  level July 29, 2023 was 45; Fatigue w/ Positive ANA Titer 1:320 Nuclear Speckled Pattern with Negative Anti DNA Antibody and Negative Smith Antibody on August 02, 2023. These findings are not c/w Lupus. However, may have some other Rheumatology condition and we should consider Rheumatology referral.  Hx of Lymphocytic Colitis treated with Budesonide  9 mg daily and was seen by Dr. Kristie July 10th, who recommended a low-fat-high-fiber diet.   She was seen for Pneumonia in this office on 09/05/2023 and at that time she was taking Augmentin and Doxycycline , which had been prescribed when she was seen at an urgent care (CXR unavailable for review), but was still coughing severely so another course of Prednisone  (also had been prescribed at [facility]) was prescribed. Today, she says that she feels much improved,  though does still feel weak.   Past Medical History:  Diagnosis Date   Anxiety    Back pain    Bursitis    right shoulder   Chronic kidney disease    hematuria, has been worked up, her normal   Chronic leukopenia    intermittant since 2010, followed by Dr. Perri now   Chronic neck pain    Constipation    Degenerative joint disease of low back 09/02/2015   Dr. Hiram   Displacement of lumbar intervertebral disc    Dysrhythmia    Tachycardia, PVCs, PACs   Edema of both lower legs    Fibrocystic breast    Gallbladder problem    GERD (gastroesophageal reflux disease)    Headache    per pt more stress/tension   Heart palpitations    SEE EPIC ENCOUTNER , CARDIOLOGY DR. WILBERT TURNER 2018; reports on 05-16-17  i haven't had any bouts of those lately     Hemorrhoids    Hidradenitis suppurativa    History of Clostridium difficile infection 12/2010   History of exercise intolerance    ETT on 04-27-2016-- negative ([facility] treadmill score 7)   History of Helicobacter pylori infection 2001 and 09/ 2012   HSV-2 infection    genital   Hx of adenomatous colonic polyps 10/17/2007   Hydradenitis    per pt currently treated with Humira  injection   Hypertension    Hyperthyroidism    Hypocupremia    Hypothyroidism    Hypothyroidism, postsurgical 1980  IBS (irritable bowel syndrome)    Insomnia    Iron  deficiency    Joint pain    Leukopenia    Lumbosacral radiculopathy    Lymphocytic colitis 05/14/2022   dx from colonoscopy   Medial meniscus tear    right knee   Mild obstructive sleep apnea    Mitral regurgitation    Mild by echo 06/2023   Numbness and tingling of right arm    Obesity    Osteoarthritis    Palpitations    Pernicious anemia    b12 def   Peroneal neuropathy    PONV (postoperative nausea and vomiting)    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   Prediabetes    Reactive hypoglycemia followed by pcp   post gastrectomy dumping syndrome   SOB (shortness of  breath)    Spinal stenosis    Tendonitis    right shoulder   Tricuspid regurgitation    Mild to moderate echo 06/2023   Varicose veins    Vertigo    Vitamin B 12 deficiency    Vitamin D  deficiency     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    Family History  Problem Relation Age of Onset   High blood pressure Mother    Hypertension Mother    Headache Mother    High Cholesterol Mother    Thyroid  disease Mother    Obesity Mother    Kidney disease Mother    Hypertension Father    Diabetes Father    High blood pressure Father    High Cholesterol Father    Heart disease Father    Obesity Father    Diabetes Sister    Hypertension Sister    Thyroid  disease Brother    Thyroid  disease Maternal Aunt    Migraines Neg Hx    Social History   Social History Narrative   Lives at home with spouse   Right handed   Caffeine: diet zero mtn dew, occasionally     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 Divorced, remarried - husband is disabled. 2 adult twin daughters. Non-smoker or drinker.    Review of Systems  Constitutional:  Positive for malaise/fatigue (lingering).  All other systems reviewed and are negative.    Objective:  Vitals: BP 100/80   Pulse 72   Ht 5' 8 (1.727 m)   Wt (!) 312 lb (141.5 kg)   SpO2 98%   BMI 47.44 kg/m   Physical Exam Vitals and nursing note reviewed.  Constitutional:      General: She is not in acute distress.    Appearance: Normal appearance. She is not toxic-appearing.  HENT:     Head: Normocephalic and atraumatic.  Pulmonary:     Effort: Pulmonary effort is normal.     Breath sounds: Normal breath sounds. No decreased breath sounds.  Skin:    General: Skin is warm and dry.  Neurological:     Mental Status: She is alert and oriented to person, place, and time. Mental status is at  baseline.  Psychiatric:        Mood and Affect: Mood normal.        Behavior: Behavior normal.        Thought Content: Thought content normal.        Judgment: Judgment normal.  Results:  Studies Obtained And Personally Reviewed By Me: Labs:     Component Value Date/Time   NA 142 09/05/2023 1626   NA 144 09/03/2022 1425   K 4.7 09/05/2023 1626   CL 104 09/05/2023 1626   CO2 26 09/05/2023 1626   GLUCOSE 104 (H) 09/05/2023 1626   BUN 21 09/05/2023 1626   BUN 9 09/03/2022 1425   CREATININE 1.28 (H) 09/05/2023 1626   CALCIUM  9.8 09/05/2023 1626   PROT 7.6 09/05/2023 1626   PROT 7.8 09/03/2022 1425   ALBUMIN 4.0 07/04/2023 1206   ALBUMIN 4.2 09/03/2022 1425   AST 17 09/05/2023 1626   AST 26 10/16/2018 0903   ALT 25 09/05/2023 1626   ALT 26 10/16/2018 0903   ALKPHOS 57 07/04/2023 1206   BILITOT 0.3 09/05/2023 1626   BILITOT 0.2 09/03/2022 1425   BILITOT 0.5 10/16/2018 0903   GFRNONAA 51 (L) 06/12/2023 1810   GFRNONAA 93 06/30/2020 1105   GFRAA 108 06/30/2020 1105    Lab Results  Component Value Date   WBC 7.7 09/05/2023   HGB 12.5 09/05/2023   HCT 39.3 09/05/2023   MCV 99.7 09/05/2023   PLT 226 09/05/2023   Lab Results  Component Value Date   CHOL 220 (H) 07/04/2023   HDL 89.40 07/04/2023   LDLCALC 116 (H) 07/04/2023   TRIG 73.0 07/04/2023   CHOLHDL 2 07/04/2023   Lab Results  Component Value Date   HGBA1C 6.0 07/04/2023    Lab Results  Component Value Date   TSH 0.84 07/04/2023   Assessment & Plan:   Orders Placed This Encounter  Procedures   DG Chest 2 View   Pneumonia: for which she was seen in this office on 09/05/2023. At that time she was taking Augmentin and Doxycycline , which had been prescribed when she was seen at an urgent care (CXR unavailable for review), but was still coughing severely so another course of Prednisone  (also had been prescribed at [facility]) was prescribed. Today, pt she feels much improved, still feeling slightly weak; physical  exam with noticeable pulmonary/respiratory  improvement. Repeat CXR today shows no acute disease. Low lung volumes noted.  Abnormal Rheumatology labs. High titer positive ANA but negative Anti-DNA. She does not appear to have lupus. May have another Rheumatology condition and would suggest Rheumatology evaluation. Ask patient to consider this.     I,Emily Lagle,acting as a Neurosurgeon for Ronal JINNY Hailstone, MD.,have documented all relevant documentation on the behalf of Ronal JINNY Hailstone, MD,as directed by  Ronal JINNY Hailstone, MD while in the presence of Ronal JINNY Hailstone, MD.   I, Ronal JINNY Hailstone, MD, have reviewed all documentation for this visit. The documentation on 09/22/23 for the exam, diagnosis, procedures, and orders are all accurate and complete.

## 2023-09-16 ENCOUNTER — Ambulatory Visit (INDEPENDENT_AMBULATORY_CARE_PROVIDER_SITE_OTHER): Admitting: Pulmonary Disease

## 2023-09-16 DIAGNOSIS — R0609 Other forms of dyspnea: Secondary | ICD-10-CM | POA: Diagnosis not present

## 2023-09-16 LAB — PULMONARY FUNCTION TEST: DL/VA % pred: 107 %

## 2023-09-16 NOTE — Progress Notes (Signed)
 Full PFT performed today.

## 2023-09-16 NOTE — Patient Instructions (Signed)
 Full PFT performed today.

## 2023-09-17 ENCOUNTER — Encounter (HOSPITAL_COMMUNITY)
Admission: RE | Admit: 2023-09-17 | Discharge: 2023-09-17 | Disposition: A | Discharge status: Home / Self Care | Source: Ambulatory Visit | Attending: Cardiology | Admitting: Cardiology

## 2023-09-17 DIAGNOSIS — R0602 Shortness of breath: Secondary | ICD-10-CM | POA: Insufficient documentation

## 2023-09-17 LAB — NM PET CT CARDIAC PERFUSION MULTI W/ABSOLUTE BLOODFLOW
MBFR: 2
Rest MBF: 0.78 ml/g/min
Rest Nuclear Isotope Dose: 30 mCi
ST Depression (mm): 0 mm
Stress MBF: 1.56 ml/g/min
Stress Nuclear Isotope Dose: 30.3 mCi
TID: 0.97

## 2023-09-17 MED ORDER — REGADENOSON 0.4 MG/5ML IV SOLN
0.4000 mg | Freq: Once | INTRAVENOUS | Status: AC
  Administered 2023-09-17: 0.4 mg via INTRAVENOUS

## 2023-09-17 MED ORDER — RUBIDIUM RB82 GENERATOR (RUBYFILL)
29.9500 | PACK | Freq: Once | INTRAVENOUS | Status: AC
  Administered 2023-09-17: 29.95 via INTRAVENOUS

## 2023-09-17 MED ORDER — REGADENOSON 0.4 MG/5ML IV SOLN
INTRAVENOUS | Status: AC
Start: 2023-09-17 — End: 2023-09-17
  Filled 2023-09-17: qty 5

## 2023-09-17 MED ORDER — RUBIDIUM RB82 GENERATOR (RUBYFILL)
30.3300 | PACK | Freq: Once | INTRAVENOUS | Status: AC
  Administered 2023-09-17: 30.33 via INTRAVENOUS

## 2023-09-17 NOTE — Progress Notes (Signed)
 Pt. Tolerated lexi scan well.

## 2023-09-18 DIAGNOSIS — R490 Dysphonia: Secondary | ICD-10-CM | POA: Diagnosis not present

## 2023-09-18 DIAGNOSIS — J370 Chronic laryngitis: Secondary | ICD-10-CM | POA: Diagnosis not present

## 2023-09-18 DIAGNOSIS — J385 Laryngeal spasm: Secondary | ICD-10-CM | POA: Diagnosis not present

## 2023-09-19 NOTE — Telephone Encounter (Signed)
-----   Message from Wilbert Bihari sent at 09/17/2023  4:47 PM EDT ----- Noncardiac findings of chest CT showed decreased air volume in the lungs likely related to obesity. ----- Message ----- From: Interface, Rad Results In Sent: 09/17/2023  11:32 AM EDT To: Wilbert JONELLE Bihari, MD

## 2023-09-19 NOTE — Telephone Encounter (Signed)
 Call to patient to advise stress/pet/ct was low risk scan with no ischemia or infarct and normal coronary blood flow reserve. Patient verbalizes understanding of noncardiac findings of chest CT showed decreased air volume in the lungs likely related to obesity.

## 2023-09-22 ENCOUNTER — Other Ambulatory Visit: Payer: Self-pay | Admitting: Nurse Practitioner

## 2023-09-22 DIAGNOSIS — R058 Other specified cough: Secondary | ICD-10-CM

## 2023-09-22 NOTE — Patient Instructions (Addendum)
 Please consider Rheumatology referral for high titer positive ANA with negative anti- Smith antibody.  Repeat CXR does not show pneumonia. Patient improved with Prednisone  and antibiotics.

## 2023-09-23 ENCOUNTER — Ambulatory Visit (INDEPENDENT_AMBULATORY_CARE_PROVIDER_SITE_OTHER): Admitting: Pulmonary Disease

## 2023-09-23 ENCOUNTER — Encounter: Payer: Self-pay | Admitting: Pulmonary Disease

## 2023-09-23 VITALS — BP 137/85 | HR 115 | Ht 67.0 in | Wt 319.2 lb

## 2023-09-23 DIAGNOSIS — R0609 Other forms of dyspnea: Secondary | ICD-10-CM

## 2023-09-23 DIAGNOSIS — G4733 Obstructive sleep apnea (adult) (pediatric): Secondary | ICD-10-CM | POA: Diagnosis not present

## 2023-09-23 DIAGNOSIS — I5032 Chronic diastolic (congestive) heart failure: Secondary | ICD-10-CM | POA: Diagnosis not present

## 2023-09-23 DIAGNOSIS — E8770 Fluid overload, unspecified: Secondary | ICD-10-CM

## 2023-09-23 DIAGNOSIS — M79641 Pain in right hand: Secondary | ICD-10-CM | POA: Diagnosis not present

## 2023-09-23 DIAGNOSIS — L732 Hidradenitis suppurativa: Secondary | ICD-10-CM | POA: Diagnosis not present

## 2023-09-23 DIAGNOSIS — J9811 Atelectasis: Secondary | ICD-10-CM | POA: Diagnosis not present

## 2023-09-23 DIAGNOSIS — M1990 Unspecified osteoarthritis, unspecified site: Secondary | ICD-10-CM | POA: Diagnosis not present

## 2023-09-23 DIAGNOSIS — M79642 Pain in left hand: Secondary | ICD-10-CM | POA: Diagnosis not present

## 2023-09-23 DIAGNOSIS — R768 Other specified abnormal immunological findings in serum: Secondary | ICD-10-CM | POA: Diagnosis not present

## 2023-09-23 DIAGNOSIS — R5383 Other fatigue: Secondary | ICD-10-CM | POA: Diagnosis not present

## 2023-09-23 DIAGNOSIS — M549 Dorsalgia, unspecified: Secondary | ICD-10-CM | POA: Diagnosis not present

## 2023-09-23 DIAGNOSIS — M79671 Pain in right foot: Secondary | ICD-10-CM | POA: Diagnosis not present

## 2023-09-23 DIAGNOSIS — R0602 Shortness of breath: Secondary | ICD-10-CM | POA: Diagnosis not present

## 2023-09-23 DIAGNOSIS — M79672 Pain in left foot: Secondary | ICD-10-CM | POA: Diagnosis not present

## 2023-09-23 DIAGNOSIS — M2550 Pain in unspecified joint: Secondary | ICD-10-CM | POA: Diagnosis not present

## 2023-09-23 LAB — PULMONARY FUNCTION TEST
DL/VA % pred: 107 %
DL/VA: 4.41 ml/min/mmHg/L
DLCO cor % pred: 79 %
DLCO cor: 17.66 ml/min/mmHg
DLCO unc % pred: 77 %
DLCO unc: 17.15 ml/min/mmHg
FEF 25-75 Post: 2.45 L/s
FEF 25-75 Pre: 2.19 L/s
FEF2575-%Change-Post: 11 %
FEF2575-%Pred-Post: 100 %
FEF2575-%Pred-Pre: 89 %
FEV1-%Change-Post: 3 %
FEV1-%Pred-Post: 77 %
FEV1-%Pred-Pre: 75 %
FEV1-Post: 2.17 L
FEV1-Pre: 2.1 L
FEV1FVC-%Change-Post: 0 %
FEV1FVC-%Pred-Pre: 107 %
FEV6-%Change-Post: 2 %
FEV6-%Pred-Post: 73 %
FEV6-%Pred-Pre: 72 %
FEV6-Post: 2.58 L
FEV6-Pre: 2.52 L
FEV6FVC-%Pred-Post: 103 %
FEV6FVC-%Pred-Pre: 103 %
FVC-%Change-Post: 2 %
FVC-%Pred-Post: 71 %
FVC-%Pred-Pre: 69 %
FVC-Post: 2.58 L
FVC-Pre: 2.52 L
Post FEV1/FVC ratio: 84 %
Post FEV6/FVC ratio: 100 %
Pre FEV1/FVC ratio: 83 %
Pre FEV6/FVC Ratio: 100 %
RV % pred: 103 %
RV: 2.26 L
TLC % pred: 88 %
TLC: 4.85 L

## 2023-09-23 NOTE — Progress Notes (Signed)
 Synopsis: Acute visit  Subjective:   PATIENT ID: Victoria [name] GENDER: female DOB: 10/10/1960, MRN: [id number]  HPI  Chief Complaint  Patient presents with   Follow-up    80m f/u for SOB.  Pt states that she is still having severe SOB with exertion.    Victoria [name] is a 63 year old female with OSA who presents with shortness of breath.  She was seen by Izetta Rouleau, NP 07/04/23 and 07/15/23 for dyspnea. She did not seem to respond to high dose symbicort  inhaler therapy or steroids + zpak after visit with m3 06/19/23. She was noted to have elevated BNP and lower extremity edema, with response to lasix  in both improvement of her dyspnea and resolution of her edema. She was instructed to be more consistent with her CPAP. Echo 5/19 shows hyperdynamic LV 70-75% and normal RV size and systolic function. Mild mitral valve regurgitation. Mild to moderate tricuspid valve regurgitation.  She experiences ongoing shortness of breath and leg swelling. She is prescribed Lasix  every other day but does not take it regularly due to frequent urination.  A CT PET cardiac scan last week showed basilar atelectasis, similar to a previous scan. She was diagnosed with pneumonia on July 8th and treated with antibiotics and steroids, initially improving but worsening over the past week. She uses Symbicort , two puffs twice a day, and albuterol  more frequently. No mucus production is noted, but she has an occasional cough and denies wheezing recently.  Breathing tests a week ago showed a decline in some measurements compared to a couple of years ago. Her diffusion capacity has decreased from two years ago but remains within normal limits.  She has a family history of lung cancer in an uncle who was a smoker. She is not a smoker.  Past Medical History:  Diagnosis Date   Anxiety    Back pain    Bursitis    right shoulder   Chronic kidney disease    hematuria, has been worked up, her normal    Chronic leukopenia    intermittant since 2010, followed by Dr. Perri now   Chronic neck pain    Constipation    Degenerative joint disease of low back 09/02/2015   Dr. Hiram   Displacement of lumbar intervertebral disc    Dysrhythmia    Tachycardia, PVCs, PACs   Edema of both lower legs    Fibrocystic breast    Gallbladder problem    GERD (gastroesophageal reflux disease)    Headache    per pt more stress/tension   Heart palpitations    SEE EPIC ENCOUTNER , CARDIOLOGY DR. WILBERT TURNER 2018; reports on 05-16-17  i haven't had any bouts of those lately     Hemorrhoids    Hidradenitis suppurativa    History of Clostridium difficile infection 12/2010   History of exercise intolerance    ETT on 04-27-2016-- negative ([facility] treadmill score 7)   History of Helicobacter pylori infection 2001 and 09/ 2012   HSV-2 infection    genital   Hx of adenomatous colonic polyps 10/17/2007   Hydradenitis    per pt currently treated with Humira  injection   Hypertension    Hyperthyroidism    Hypocupremia    Hypothyroidism    Hypothyroidism, postsurgical 1980   IBS (irritable bowel syndrome)    Insomnia    Iron  deficiency    Joint pain    Leukopenia    Lumbosacral radiculopathy    Lymphocytic colitis  05/14/2022   dx from colonoscopy   Medial meniscus tear    right knee   Mild obstructive sleep apnea    Mitral regurgitation    Mild by echo 06/2023   Numbness and tingling of right arm    Obesity    Osteoarthritis    Palpitations    Pernicious anemia    b12 def   Peroneal neuropathy    PONV (postoperative nausea and vomiting)    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   Prediabetes    Reactive hypoglycemia followed by pcp   post gastrectomy dumping syndrome   SOB (shortness of breath)    Spinal stenosis    Tendonitis    right shoulder   Tricuspid regurgitation    Mild to moderate echo 06/2023   Varicose veins    Vertigo    Vitamin B 12 deficiency    Vitamin D  deficiency       Family History  Problem Relation Age of Onset   High blood pressure Mother    Hypertension Mother    Headache Mother    High Cholesterol Mother    Thyroid  disease Mother    Obesity Mother    Kidney disease Mother    Hypertension Father    Diabetes Father    High blood pressure Father    High Cholesterol Father    Heart disease Father    Obesity Father    Diabetes Sister    Hypertension Sister    Thyroid  disease Brother    Thyroid  disease Maternal Aunt    Migraines Neg Hx      Social History   Socioeconomic History   Marital status: Married    Spouse name: Adriana   Number of children: 2   Years of education: Not on file   Highest education level: Not on file  Occupational History   Occupation: Charity fundraiser - Adult nurse GI    Employer: [employer]  Tobacco Use   Smoking status: Never   Smokeless tobacco: Never  Vaping Use   Vaping status: Never Used  Substance and Sexual Activity   Alcohol use: No   Drug use: No   Sexual activity: Not Currently    Partners: Male    Birth control/protection: Surgical    Comment: hysterectomy  Other Topics Concern   Not on file  Social History Narrative   Lives at home with spouse   Right handed   Caffeine: diet zero mtn dew, occasionally     2025 - Recently retired from [employer] as an Charity fundraiser, previously worked full-time, now working 1 day/ week. Divorced, remarried - husband is disabled. 2 adult twin daughters. Non-smoker or drinker.    Social Drivers of Corporate investment banker Strain: Not on file  Food Insecurity: Low Risk  (07/12/2023)   Received from [facility]   Hunger Vital Sign    Within the past 12 months, you worried that your food would run out before you got money to buy more: Never true    Within the past 12 months, the food you bought just didn't last and you didn't have money to get more. : Never true  Transportation Needs: No Transportation Needs (07/12/2023)   Received from [facility]     In the past 12 months, has lack of reliable transportation kept you from medical appointments, meetings, work or from getting things needed for daily living? : No  Physical Activity: Not on file  Stress: Not on file  Social Connections: Not on file  Intimate Partner Violence: Not At Risk (07/13/2022)   Humiliation, Afraid, Rape, and Kick questionnaire    Fear of Current or Ex-Partner: No    Emotionally Abused: No    Physically Abused: No    Sexually Abused: No     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     Outpatient Medications Prior to Visit  Medication Sig Dispense Refill   albuterol  (VENTOLIN  HFA) 108 (90 Base) MCG/ACT inhaler Inhale 2 puffs into the lungs every 4 hours as needed for wheezing or shortness of breath. 18 g 3   benzonatate  (TESSALON ) 200 MG capsule Take 1 capsule (200 mg total) by mouth 3 (three) times daily as needed for cough. 30 capsule 1   budesonide  (ENTOCORT EC ) 3 MG 24 hr capsule Take 3 capsules (9 mg total) by mouth in the morning. 270 capsule 3   budesonide -formoterol  (SYMBICORT ) 160-4.5 MCG/ACT inhaler Inhale 2 puffs into the lungs 2 (two) times daily. 1 each 12   buPROPion  (WELLBUTRIN  XL) 150 MG 24 hr tablet Take 1 tablet (150 mg total) by mouth daily. 90 tablet 1   Carbinoxamine  Maleate 4 MG TABS Take 1-2 tablets at night as needed for drainage and allergies. May cause drowsiness. 28 tablet 5   cyanocobalamin  (DODEX ) 1000 MCG/ML injection INJECT 1 ML INTRAMUSCULARLY EVERY MONTH 3 mL 11   estradiol  (ESTRACE  VAGINAL) 0.1 MG/GM vaginal cream Place 1 g vaginally 3 (three) times a week. 42.5 g 3   estradiol  (VIVELLE -DOT) 0.1 MG/24HR patch Place 1 patch (0.1 mg total) onto the skin 2 (two) times a week. 8 patch 12   fluticasone  (FLONASE ) 50 MCG/ACT nasal spray PLACE 1 TO 2 SPRAYS INTO EACH NOSTRIL DAILY 16 mL 2   furosemide  (LASIX ) 40 MG tablet TAKE ONE TABLET EVERY OTHER DAY AS  NEEDED FOR PITTING EDEMA. 15 tablet 1   gabapentin  (NEURONTIN ) 600 MG tablet Take 1 tablet (600 mg total) by mouth 3 (three) times daily. 90 tablet 5   HYDROcodone -acetaminophen  (NORCO) 10-325 MG tablet One tab every 6 hours as needed for pain 20 tablet 0   HYRIMOZ  40 MG/0.4ML SOAJ      ipratropium (ATROVENT ) 0.03 % nasal spray Use 1-2 sprays in each nostril up to 3 times daily as needed for runny nose. Can take 15 minutes prior to meals. 30 mL 3   levothyroxine  (SYNTHROID ) 112 MCG tablet Take 1 tablet (112 mcg total) by mouth daily. 90 tablet 3   LORazepam  (ATIVAN ) 1 MG tablet TAKE 1 TABLET BY MOUTH TWICE A DAY FOR ANXIETY 60 tablet 2   metoprolol  succinate (TOPROL -XL) 25 MG 24 hr tablet Take 1.5 tablets (37.5 mg total) by mouth daily. 45 tablet 11   Olopatadine  HCl 0.2 % SOLN Apply 1 drop to eye daily as needed. (Patient taking differently: Apply 1 drop to eye daily as needed (allergies).) 2.5 mL 5   potassium chloride  (KLOR-CON  M) 10 MEQ tablet TAKE 1 TABLET EVERY OTHER DAY WITH LASIX  (FUROSEMIDE ) 15 tablet 1   promethazine  (PHENERGAN ) 25 MG tablet Take 1 tablet (25 mg total) by mouth every 8 (eight) hours as needed for nausea and/or vomiting 30 tablet 5   promethazine -dextromethorphan (PROMETHAZINE -DM) 6.25-15 MG/5ML syrup Take 5 mLs by mouth 4 (four) times daily as needed.     tiZANidine  (ZANAFLEX ) 4 MG tablet Take 1 tablet (4 mg total) by mouth 3 (three) times daily.  90 tablet 3   Ubrogepant  (UBRELVY ) 100 MG TABS Take 1 tablet (100 mg total) by mouth every 2 (two) hours as needed. Maximum 200mg  a day. 16 tablet 11   Vitamin D , Ergocalciferol , (DRISDOL ) 1.25 MG (50000 UNIT) CAPS capsule TAKE 1 CAPSULE BY MOUTH ONE TIME PER WEEK 4 capsule 2   Facility-Administered Medications Prior to Visit  Medication Dose Route Frequency Provider Last Rate Last Admin   gadobenate dimeglumine  (MULTIHANCE ) injection 20 mL  20 mL Intravenous Once PRN Ines Onetha NOVAK, MD        Review of Systems   Constitutional:  Negative for chills, fever, malaise/fatigue and weight loss.  HENT:  Negative for congestion, sinus pain and sore throat.   Eyes: Negative.   Respiratory:  Positive for cough and shortness of breath. Negative for hemoptysis, sputum production and wheezing.   Cardiovascular:  Negative for chest pain, palpitations, orthopnea, claudication and leg swelling.  Gastrointestinal:  Negative for abdominal pain, heartburn, nausea and vomiting.  Genitourinary: Negative.   Musculoskeletal:  Negative for joint pain and myalgias.  Skin:  Negative for rash.  Neurological:  Negative for weakness.  Endo/Heme/Allergies: Negative.   Psychiatric/Behavioral: Negative.        Objective:   Vitals:   09/23/23 0923  BP: 137/85  Pulse: (!) 115  SpO2: 99%  Weight: (!) 319 lb 3.2 oz (144.8 kg)  Height: 5' 7 (1.702 m)      Physical Exam Constitutional:      General: She is not in acute distress.    Appearance: Normal appearance. She is obese.  Eyes:     General: No scleral icterus.    Conjunctiva/sclera: Conjunctivae normal.  Cardiovascular:     Rate and Rhythm: Normal rate and regular rhythm.  Pulmonary:     Breath sounds: No wheezing, rhonchi or rales.  Musculoskeletal:     Right lower leg: No edema.     Left lower leg: No edema.  Skin:    General: Skin is warm and dry.  Neurological:     General: No focal deficit present.       CBC    Component Value Date/Time   WBC 7.7 09/05/2023 1626   RBC 3.94 09/05/2023 1626   HGB 12.5 09/05/2023 1626   HGB 10.7 (L) 07/29/2023 1253   HGB 12.5 06/11/2023 0902   HGB 12.6 07/09/2016 1529   HCT 39.3 09/05/2023 1626   HCT 38.6 06/11/2023 0902   HCT 38.2 07/09/2016 1529   PLT 226 09/05/2023 1626   PLT 236 07/29/2023 1253   PLT 201 06/11/2023 0902   MCV 99.7 09/05/2023 1626   MCV 99 (H) 06/11/2023 0902   MCV 96.9 07/09/2016 1529   MCH 31.7 09/05/2023 1626   MCHC 31.8 (L) 09/05/2023 1626   RDW 14.3 09/05/2023 1626   RDW  13.1 06/11/2023 0902   RDW 13.5 07/09/2016 1529   LYMPHSABS 1.0 07/29/2023 1253   LYMPHSABS 1.3 09/03/2022 1425   LYMPHSABS 1.6 07/09/2016 1529   MONOABS 0.5 07/29/2023 1253   MONOABS 0.3 07/09/2016 1529   EOSABS 0 (L) 09/05/2023 1626   EOSABS 0.1 09/03/2022 1425   BASOSABS 8 09/05/2023 1626   BASOSABS 0.0 09/03/2022 1425   BASOSABS 0.0 07/09/2016 1529      Latest Ref Rng & Units 09/05/2023    4:26 PM 07/15/2023    1:53 PM 07/04/2023   12:06 PM  BMP  Glucose 65 - 99 mg/dL 895  898  75   BUN 7 - 25  mg/dL 21  18  16    Creatinine 0.50 - 1.05 mg/dL 8.71  8.82  8.76   BUN/Creat Ratio 6 - 22 (calc) 16     Sodium 135 - 146 mmol/L 142  143  142   Potassium 3.5 - 5.3 mmol/L 4.7  4.2  3.9   Chloride 98 - 110 mmol/L 104  109  106   CO2 20 - 32 mmol/L 26  26  26    Calcium  8.6 - 10.4 mg/dL 9.8  9.0  8.8    Chest imaging: CT Chest PE 06/12/23 1. No acute pulmonary embolism. 2. Mild bronchial wall thickening in keeping with airway inflammation. 3. 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 4. Mild elevation of the left hemidiaphragm. 5. 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  PFT:    Latest Ref Rng & Units 09/16/2023    8:49 AM 02/15/2022    9:05 AM  PFT Results  FVC-Pre L 2.52  P 3.01   FVC-Predicted Pre % 69  P 82   FVC-Post L 2.58  P 2.99   FVC-Predicted Post % 71  P 81   Pre FEV1/FVC % % 83  P 86   Post FEV1/FCV % % 84  P 86   FEV1-Pre L 2.10  P 2.59   FEV1-Predicted Pre % 75  P 91   FEV1-Post L 2.17  P 2.58   DLCO uncorrected ml/min/mmHg 17.15  P 20.51   DLCO UNC% % 77  P 92   DLCO corrected ml/min/mmHg 17.66  P 20.91   DLCO COR %Predicted % 79  P 93   DLVA Predicted % 107  P 109   TLC L 4.85  P 5.02   TLC % Predicted % 88  P 91   RV % Predicted % 103  P 90     P Preliminary result    Labs:  Path:  Echo:  Heart Catheterization:       Assessment & Plan:   DOE (dyspnea on  exertion)  Hypervolemia, unspecified hypervolemia type  Discussion: Victoria [name] is a 63 year old female with OSA who returns with shortness of breath.  Basilar atelectasis Basilar atelectasis noted on CT PET scan, likely positional and reversible with exercises. Differential includes lung inflammation due to positive ANA. - Schedule high-resolution CT scan of the chest with prone positioning. - Encourage deep breathing exercises and walking.  Potential lung inflammation Concern for lung inflammation due to positive ANA and nonspecific pulmonary function tests. Awaiting rheumatology input. - Request rheumatologist to fax consultation notes.  Diastolic heart failure Suspected diastolic heart failure due to hyperdynamic heart on echo and positive response to diuretic therapy - Will reach out to cardiology for further follow up - Encourage adherence to Lasix  regimen, adjusting timing.  Obstructive sleep apnea Managed with CPAP, with improvement in usage and symptoms.  Follow up in 4 months  Dorn Chill, MD [facility] Office: [phone]    Current Outpatient Medications:    albuterol  (VENTOLIN  HFA) 108 (90 Base) MCG/ACT inhaler, Inhale 2 puffs into the lungs every 4 hours as needed for wheezing or shortness of breath., Disp: 18 g, Rfl: 3   benzonatate  (TESSALON ) 200 MG capsule, Take 1 capsule (200 mg total) by mouth 3 (three) times daily as needed for cough., Disp: 30 capsule, Rfl: 1   budesonide  (ENTOCORT EC ) 3 MG 24 hr capsule, Take 3 capsules (9 mg total) by mouth in  the morning., Disp: 270 capsule, Rfl: 3   budesonide -formoterol  (SYMBICORT ) 160-4.5 MCG/ACT inhaler, Inhale 2 puffs into the lungs 2 (two) times daily., Disp: 1 each, Rfl: 12   buPROPion  (WELLBUTRIN  XL) 150 MG 24 hr tablet, Take 1 tablet (150 mg total) by mouth daily., Disp: 90 tablet, Rfl: 1   Carbinoxamine  Maleate 4 MG TABS, Take 1-2 tablets at night as needed for drainage  and allergies. May cause drowsiness., Disp: 28 tablet, Rfl: 5   cyanocobalamin  (DODEX ) 1000 MCG/ML injection, INJECT 1 ML INTRAMUSCULARLY EVERY MONTH, Disp: 3 mL, Rfl: 11   estradiol  (ESTRACE  VAGINAL) 0.1 MG/GM vaginal cream, Place 1 g vaginally 3 (three) times a week., Disp: 42.5 g, Rfl: 3   estradiol  (VIVELLE -DOT) 0.1 MG/24HR patch, Place 1 patch (0.1 mg total) onto the skin 2 (two) times a week., Disp: 8 patch, Rfl: 12   fluticasone  (FLONASE ) 50 MCG/ACT nasal spray, PLACE 1 TO 2 SPRAYS INTO EACH NOSTRIL DAILY, Disp: 16 mL, Rfl: 2   furosemide  (LASIX ) 40 MG tablet, TAKE ONE TABLET EVERY OTHER DAY AS NEEDED FOR PITTING EDEMA., Disp: 15 tablet, Rfl: 1   gabapentin  (NEURONTIN ) 600 MG tablet, Take 1 tablet (600 mg total) by mouth 3 (three) times daily., Disp: 90 tablet, Rfl: 5   HYDROcodone -acetaminophen  (NORCO) 10-325 MG tablet, One tab every 6 hours as needed for pain, Disp: 20 tablet, Rfl: 0   HYRIMOZ  40 MG/0.4ML SOAJ, , Disp: , Rfl:    ipratropium (ATROVENT ) 0.03 % nasal spray, Use 1-2 sprays in each nostril up to 3 times daily as needed for runny nose. Can take 15 minutes prior to meals., Disp: 30 mL, Rfl: 3   levothyroxine  (SYNTHROID ) 112 MCG tablet, Take 1 tablet (112 mcg total) by mouth daily., Disp: 90 tablet, Rfl: 3   LORazepam  (ATIVAN ) 1 MG tablet, TAKE 1 TABLET BY MOUTH TWICE A DAY FOR ANXIETY, Disp: 60 tablet, Rfl: 2   metoprolol  succinate (TOPROL -XL) 25 MG 24 hr tablet, Take 1.5 tablets (37.5 mg total) by mouth daily., Disp: 45 tablet, Rfl: 11   Olopatadine  HCl 0.2 % SOLN, Apply 1 drop to eye daily as needed. (Patient taking differently: Apply 1 drop to eye daily as needed (allergies).), Disp: 2.5 mL, Rfl: 5   potassium chloride  (KLOR-CON  M) 10 MEQ tablet, TAKE 1 TABLET EVERY OTHER DAY WITH LASIX  (FUROSEMIDE ), Disp: 15 tablet, Rfl: 1   promethazine  (PHENERGAN ) 25 MG tablet, Take 1 tablet (25 mg total) by mouth every 8 (eight) hours as needed for nausea and/or vomiting, Disp: 30 tablet,  Rfl: 5   promethazine -dextromethorphan (PROMETHAZINE -DM) 6.25-15 MG/5ML syrup, Take 5 mLs by mouth 4 (four) times daily as needed., Disp: , Rfl:    tiZANidine  (ZANAFLEX ) 4 MG tablet, Take 1 tablet (4 mg total) by mouth 3 (three) times daily., Disp: 90 tablet, Rfl: 3   Ubrogepant  (UBRELVY ) 100 MG TABS, Take 1 tablet (100 mg total) by mouth every 2 (two) hours as needed. Maximum 200mg  a day., Disp: 16 tablet, Rfl: 11   Vitamin D , Ergocalciferol , (DRISDOL ) 1.25 MG (50000 UNIT) CAPS capsule, TAKE 1 CAPSULE BY MOUTH ONE TIME PER WEEK, Disp: 4 capsule, Rfl: 2 No current facility-administered medications for this visit.  Facility-Administered Medications Ordered in Other Visits:    gadobenate dimeglumine  (MULTIHANCE ) injection 20 mL, 20 mL, Intravenous, Once PRN, Ines Onetha NOVAK, MD

## 2023-09-23 NOTE — Patient Instructions (Addendum)
 Continue symbicort  2 puffs twice daily - rinse mouth out after each use  Your breathing tests show a non-specific pulmonary function pattern  Given your positive ANA test, we will check a high resolution CT Chest scan to evaluate for interstitial lung disease  Please have your rheumatologist send their visit notes and lab results to us  when available.   I will reach out to Dr. Silvio regarding treatment of diastolic heart failure.  Follow up in 4 months

## 2023-09-24 ENCOUNTER — Other Ambulatory Visit: Payer: Self-pay | Admitting: Internal Medicine

## 2023-09-25 ENCOUNTER — Telehealth: Payer: Self-pay

## 2023-09-25 ENCOUNTER — Other Ambulatory Visit: Payer: Self-pay | Admitting: Obstetrics and Gynecology

## 2023-09-25 DIAGNOSIS — Z79899 Other long term (current) drug therapy: Secondary | ICD-10-CM

## 2023-09-25 DIAGNOSIS — N951 Menopausal and female climacteric states: Secondary | ICD-10-CM

## 2023-09-25 DIAGNOSIS — R0602 Shortness of breath: Secondary | ICD-10-CM

## 2023-09-25 NOTE — Telephone Encounter (Signed)
-----   Message from Wilbert Bihari sent at 09/24/2023  8:08 AM EDT ----- Regarding: RE: follow up for concern of diastolic heart failure Please have this patient come in for a BNP and BMET and get in after that with extender for SOB ----- Message ----- From: Kara Dorn NOVAK, MD Sent: 09/23/2023   8:48 PM EDT To: Wilbert JONELLE Bihari, MD Subject: follow up for concern of diastolic heart fai#  Hi Dr. Bihari,  Patient continues to have exertional dyspnea with lower extremity edema. She has benefited from lasix  in the past. She is a bit non-compliant with her current regimen, but I think she would benefit from be evaluated in the cardiology clinic. I am concerned there may be a component of diastolic heart failure.  Thanks, Thom

## 2023-09-25 NOTE — Telephone Encounter (Signed)
 Spoke with patient, advised Dr. Shlomo would like her to come in and do BMET and BNP. Patient agrees. Patient also accept appt 10/02/23 with APP. Lab orders placed and released, reviewed ED precautions. Patient verbalizes understanding to call 911 or present to ED if SOB worsens.

## 2023-09-26 ENCOUNTER — Encounter: Payer: Self-pay | Admitting: Obstetrics and Gynecology

## 2023-09-26 DIAGNOSIS — R0602 Shortness of breath: Secondary | ICD-10-CM | POA: Diagnosis not present

## 2023-09-26 DIAGNOSIS — Z79899 Other long term (current) drug therapy: Secondary | ICD-10-CM | POA: Diagnosis not present

## 2023-09-26 NOTE — Telephone Encounter (Signed)
.  Med refill request: Estradiol  Last AEX: 02/12/23 Last OV: 06/21/23 Next AEX: Not scheduled  Last MMG (if hormonal med) 07/10/23 BI- Rads 1-Neg Refill authorized: Please Advise?

## 2023-09-26 NOTE — Telephone Encounter (Signed)
 I have reviewed patient's chart, and I recommend an office visit with me.  I would recommend we start weaning her off estrogen therapy.   I would recommend lowering her estradiol  patch to 0.05 mg twice weekly.   #24, RF none.

## 2023-09-27 ENCOUNTER — Other Ambulatory Visit: Payer: Self-pay

## 2023-09-27 LAB — PRO B NATRIURETIC PEPTIDE: NT-Pro BNP: 110 pg/mL (ref 0–287)

## 2023-09-27 LAB — BASIC METABOLIC PANEL WITH GFR
BUN/Creatinine Ratio: 15 (ref 12–28)
BUN: 19 mg/dL (ref 8–27)
CO2: 18 mmol/L — ABNORMAL LOW (ref 20–29)
Calcium: 8.8 mg/dL (ref 8.7–10.3)
Chloride: 102 mmol/L (ref 96–106)
Creatinine, Ser: 1.25 mg/dL — ABNORMAL HIGH (ref 0.57–1.00)
Glucose: 71 mg/dL (ref 70–99)
Potassium: 4.2 mmol/L (ref 3.5–5.2)
Sodium: 141 mmol/L (ref 134–144)
eGFR: 49 mL/min/1.73 — ABNORMAL LOW (ref >59)

## 2023-09-27 NOTE — Telephone Encounter (Signed)
 Spoke with the pt she is aware to decrease the estradiol  patch. She was transferred to the front desk to schedule an OV.

## 2023-09-28 ENCOUNTER — Ambulatory Visit: Payer: Self-pay | Admitting: Cardiology

## 2023-09-28 DIAGNOSIS — R0602 Shortness of breath: Secondary | ICD-10-CM

## 2023-09-30 ENCOUNTER — Ambulatory Visit (INDEPENDENT_AMBULATORY_CARE_PROVIDER_SITE_OTHER): Admitting: Obstetrics and Gynecology

## 2023-09-30 ENCOUNTER — Encounter: Payer: Self-pay | Admitting: Obstetrics and Gynecology

## 2023-09-30 VITALS — BP 108/66 | HR 61

## 2023-09-30 DIAGNOSIS — Z79899 Other long term (current) drug therapy: Secondary | ICD-10-CM | POA: Diagnosis not present

## 2023-09-30 DIAGNOSIS — N951 Menopausal and female climacteric states: Secondary | ICD-10-CM | POA: Diagnosis not present

## 2023-09-30 MED ORDER — ESTRADIOL 0.075 MG/24HR TD PTTW
1.0000 | MEDICATED_PATCH | TRANSDERMAL | 5 refills | Status: DC
Start: 2023-09-30 — End: 2024-01-06

## 2023-09-30 NOTE — Progress Notes (Signed)
 FYI

## 2023-09-30 NOTE — Progress Notes (Signed)
 GYNECOLOGY  VISIT   HPI: 63 y.o.   Married  [place] female   [phone] with No LMP recorded. Patient has had a hysterectomy.   here for: Med follow up -  Estrace  and Vivelle .    Taking ERT for about 5 years.  She started this due to hot flashes.  Divigel  worked better than the patch.  Has increased hot flashes with activity.   Patient has hx heart failure.    Taking Gabaptentin 600 mg at hs for neuropathy.  Sometimes takes 600 mg during the day.   Was taking Wellbutrin  and Lexapro  due to stress.  She continues on Wellbutrin  but has stopped Lexapro , as she was having increased hot flashes.   GYNECOLOGIC HISTORY: No LMP recorded. Patient has had a hysterectomy. Contraception:  Hyst  Menopausal hormone therapy:  Estrace  & Vivelle   Last 2 paps:  08/14/18 neg, 02/11/12 neg  History of abnormal Pap or positive HPV:  no Mammogram:  07/10/23 Breast Density Cat B, BIRADS Cat 1 neg         OB History     Gravida  2   Para  1   Term      Preterm  1   AB  1   Living  2      SAB      IAB  1   Ectopic      Multiple  1   Live Births  2              Patient Active Problem List   Diagnosis Date Noted   Mitral regurgitation    Tricuspid regurgitation    Upper airway cough syndrome 07/04/2023   Generalized anxiety disorder 10/14/2022   Prediabetes 09/13/2022   Abnormal metabolism 09/13/2022   Polypharmacy 08/13/2022   Status post total right knee replacement 07/13/2022   Lymphocytic colitis 07/12/2022   Migraine without aura and without status migrainosus, not intractable 07/02/2022   Chronic conjunctivitis of both eyes 06/28/2022   Chronic rhinitis 05/09/2022   Depressed mood 12/19/2021   Obstructive sleep apnea 12/19/2021   Other hypoglycemia-post bariatric 12/05/2021   Secondary hyperparathyroidism (HCC) 12/05/2021   Inadequate sleep hygiene 12/05/2021   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   SOBOE (shortness of breath on exertion)  11/21/2021   Other fatigue 11/21/2021   Vitamin D  deficiency 11/21/2021   Iron  deficiency 11/21/2021   Hypoglycemia after bariatric surgery 11/21/2021   Depression screening 11/21/2021   Class 3 severe obesity with serious comorbidity and body mass index (BMI) of 40.0 to 44.9 in adult 11/21/2021   Vertigo 11/17/2021   Excessive daytime sleepiness 10/23/2021   Spinal stenosis of lumbar region 07/07/2020   Displacement of lumbar intervertebral disc 06/02/2020   Lumbar spondylosis 06/02/2020   Lumbar stenosis with neurogenic claudication 06/02/2020   Arm numbness 05/30/2020   Chronic neck pain 05/30/2020   Other chronic pain 05/30/2020   Status post arthroscopy of right knee 12/11/2019   Polyethylene wear of left knee joint prosthesis (HCC) 09/09/2019   Acute medial meniscal tear, right, subsequent encounter 08/27/2019   History of total left knee replacement 07/16/2019   Peroneal neuropathy at knee, left 06/25/2019   Lumbosacral radiculopathy 06/07/2019   Iron  malabsorption 10/16/2018   IDA (iron  deficiency anemia) 10/16/2018   OA (osteoarthritis) of knee 01/20/2018   Acute meniscal tear, medial 07/17/2017   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   Failed total hip arthroplasty, 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/2017   Pain in left knee 04/11/2017  Insomnia 11/08/2016   Leukopenia 07/09/2016   Hypocupremia 07/09/2016   Palpitations 04/12/2016   Shortness of breath 04/12/2016   Reactive hypoglycemia 10/31/2015   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/31/2015   Lap gastric bypass May 2016 07/06/2014   History of Clostridium difficile infection 08/12/2011   Post-surgical hypothyroidism 08/26/2010   Gastroesophageal reflux disease 08/26/2010   Vitamin D  deficiency 08/26/2010   Fibrocystic disease of breast 08/26/2010   Cobalamin deficiency 08/26/2010   Morbid obesity (HCC) 03/20/2010   Backache 03/20/2010    Past Medical History:  Diagnosis Date   Anxiety    Back pain    Bursitis    right  shoulder   Chronic diastolic heart failure (HCC)    Chronic kidney disease    hematuria, has been worked up, her normal   Chronic leukopenia    intermittant since 2010, followed by Dr. Perri now   Chronic neck pain    Constipation    Degenerative joint disease of low back 09/02/2015   Dr. Hiram   Displacement of lumbar intervertebral disc    Dysrhythmia    Tachycardia, PVCs, PACs   Edema of both lower legs    Fibrocystic breast    Gallbladder problem    GERD (gastroesophageal reflux disease)    Headache    per pt more stress/tension   Heart palpitations    SEE EPIC ENCOUTNER , CARDIOLOGY DR. WILBERT TURNER 2018; reports on 05-16-17  i haven't had any bouts of those lately     Hemorrhoids    Hidradenitis suppurativa    History of Clostridium difficile infection 12/2010   History of exercise intolerance    ETT on 04-27-2016-- negative ([facility] treadmill score 7)   History of Helicobacter pylori infection 2001 and 09/ 2012   HSV-2 infection    genital   Hx of adenomatous colonic polyps 10/17/2007   Hydradenitis    per pt currently treated with Humira  injection   Hypertension    Hyperthyroidism    Hypocupremia    Hypothyroidism    Hypothyroidism, postsurgical 1980   IBS (irritable bowel syndrome)    Insomnia    Iron  deficiency    Joint pain    Leukopenia    Lumbosacral radiculopathy    Lymphocytic colitis 05/14/2022   dx from colonoscopy   Medial meniscus tear    right knee   Mild obstructive sleep apnea    Mitral regurgitation    Mild by echo 06/2023   Numbness and tingling of right arm    Obesity    Osteoarthritis    Palpitations    Pernicious anemia    b12 def   Peroneal neuropathy    PONV (postoperative nausea and vomiting)    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   Prediabetes    Reactive hypoglycemia followed by pcp   post gastrectomy dumping syndrome   SOB (shortness of breath)    Spinal stenosis    Tendonitis    right shoulder   Tricuspid  regurgitation    Mild to moderate echo 06/2023   Varicose veins    Vertigo    Vitamin B 12 deficiency    Vitamin D  deficiency     Past Surgical History:  Procedure Laterality Date   BREATH TEK H PYLORI N/A 05/03/2014   Procedure: BREATH TEK H PYLORI;  Surgeon: Adina Lunger, MD;  Location: [facility];  Service: General;  Laterality: N/A;   [place]  07/2023   urethral injections for stress incontinence   CARDIOVASCULAR STRESS TEST  03/11/2013   dr wilbert turner   Low risk nuclear study w/ a very small anterior perfusion defect that most likely represents breast attenuation artifact, less likely this could represent a true perfusion abnormality in the distribution of diagonal branch of the LAD/  normal LV function and wall motion , ef 66%   CATARACT EXTRACTION W/ INTRAOCULAR LENS  IMPLANT, BILATERAL  10/2017   CESAREAN SECTION  1985   CHOLECYSTECTOMY OPEN  1987   COLONOSCOPY  02/2017   Dr Kristie ; per patient they found 7 polyps with polypectomy; on 5 year track    ESOPHAGOGASTRODUODENOSCOPY  02/01/2021   FINE NEEDLE ASPIRATION Left 05/22/2017   Procedure: LEFT KNEE ASPIRATION;  Surgeon: Melodi Lerner, MD;  Location: [facility];  Service: Orthopedics;  Laterality: Left;   GASTRIC ROUX-EN-Y N/A 07/06/2014   Procedure: LAPAROSCOPIC ROUX-EN-Y GASTRIC BYPASS, ENTEROLYSIS OF ADHESIONS WITH UPPER ENDOSCOPY;  Surgeon: Donnice Lunger, MD;  Location: [facility];  Service: General;  Laterality: N/A;   HAMMER TOE SURGERY Bilateral 02/17/2008   bilateral foot digit's 2 and 5   HIATAL HERNIA REPAIR N/A 07/06/2014   Procedure: LAPAROSCOPIC REPAIR OF HIATAL HERNIA;  Surgeon: Donnice Lunger, MD;  Location: [facility];  Service: General;  Laterality: N/A;   I & D KNEE WITH POLY EXCHANGE Left 12/11/2019   Procedure: LEFT KNEE POLY-LINER EXCHANGE;  Surgeon: Vernetta Lonni GRADE, MD;  Location: [facility];  Service: Orthopedics;  Laterality: Left;   KNEE ARTHROSCOPY Right 12/11/2019   Procedure: RIGHT KNEE ARTHROSCOPY  WITH PARTIAL MEDIAL MENISCECTOMY;  Surgeon: Vernetta Lonni GRADE, MD;  Location: [facility];  Service: Orthopedics;  Laterality: Right;   KNEE ARTHROSCOPY WITH MEDIAL MENISECTOMY Left 07/17/2017   Procedure: LEFT KNEE ARTHROSCOPY WITH MEDIAL LATERAL MENISECTOMY;  Surgeon: Melodi Lerner, MD;  Location: [facility];  Service: Orthopedics;  Laterality: Left;   MASS EXCISION N/A 10/11/2016   Procedure: EXCISION OF PERIANAL MASS;  Surgeon: Debby Hila, MD;  Location: [facility];  Service: General;  Laterality: N/A;   PARTIAL HYSTERECTOMY  2006   has ovaries   REFRACTIVE SURGERY     THYROIDECTOMY  1980   TOTAL ABDOMINAL HYSTERECTOMY  08-25-2002   dr macario sharps   ovaries retained   TOTAL HIP ARTHROPLASTY Right 05/21/2012   Procedure: TOTAL HIP ARTHROPLASTY;  Surgeon: Lerner LULLA Melodi, MD;  Location: [facility];  Service: Orthopedics;  Laterality: Right;   TOTAL HIP ARTHROPLASTY Left 01-31-2009  dr melodi   TOTAL HIP REVISION Left 05/22/2017   Procedure: Left hip bearing surface and head revision;  Surgeon: Melodi Lerner, MD;  Location: [facility];  Service: Orthopedics;  Laterality: Left;   TOTAL KNEE ARTHROPLASTY Left 01/20/2018   Procedure: LEFT TOTAL KNEE ARTHROPLASTY;  Surgeon: Melodi Lerner, MD;  Location: [facility];  Service: Orthopedics;  Laterality: Left;    TOTAL KNEE ARTHROPLASTY Right 07/13/2022   Procedure: RIGHTTOTAL KNEE ARTHROPLASTY;  Surgeon: Vernetta Lonni GRADE, MD;  Location: [facility];  Service: Orthopedics;  Laterality: Right;   TRANSTHORACIC ECHOCARDIOGRAM  05/03/2016   ef 55-60%/  trivial MR/  mild TR   TUBAL LIGATION      Current Outpatient Medications  Medication Sig Dispense Refill   albuterol  (VENTOLIN  HFA) 108 (90 Base) MCG/ACT inhaler Inhale 2 puffs into the lungs every 4 hours as needed for wheezing or shortness of breath. 18 g 3   benzonatate  (TESSALON ) 200 MG capsule Take 1 capsule (200 mg total) by mouth 3 (three) times daily as needed for cough. 30  capsule 1   budesonide  (ENTOCORT EC ) 3 MG 24 hr capsule Take 3 capsules (9 mg total) by mouth in the morning. 270 capsule 3   budesonide -formoterol  (SYMBICORT ) 160-4.5 MCG/ACT inhaler Inhale 2 puffs into the lungs 2 (two) times daily. 1 each 12   buPROPion  (WELLBUTRIN  XL) 150 MG 24 hr tablet Take 1 tablet (150 mg total) by mouth daily. 90 tablet 1   Carbinoxamine  Maleate 4 MG TABS Take 1-2 tablets at night as needed for drainage and allergies. May cause drowsiness. 28 tablet 5   cyanocobalamin  (DODEX ) 1000 MCG/ML injection INJECT 1 ML INTRAMUSCULARLY EVERY MONTH 3 mL 11   estradiol  (ESTRACE  VAGINAL) 0.1 MG/GM vaginal cream Place 1 g vaginally 3 (three) times a week. 42.5 g 3   estradiol  (VIVELLE -DOT) 0.1 MG/24HR patch Place 1 patch (0.1 mg total) onto the skin 2 (two) times a week. 8 patch 12   fluticasone  (FLONASE ) 50 MCG/ACT nasal spray PLACE 1 TO 2 SPRAYS INTO EACH NOSTRIL DAILY 16 mL 2   furosemide  (LASIX ) 40 MG tablet TAKE ONE TABLET EVERY OTHER DAY AS NEEDED FOR PITTING EDEMA. 15 tablet 1   gabapentin  (NEURONTIN ) 600 MG tablet TAKE 1 TABLET BY MOUTH 3 TIMES A DAY 90 tablet 3   HYDROcodone -acetaminophen  (NORCO) 10-325 MG tablet One tab every 6 hours as needed for pain 20 tablet 0   HYRIMOZ  40 MG/0.4ML SOAJ      ipratropium (ATROVENT ) 0.03 % nasal spray Use 1-2 sprays in each nostril up to 3 times daily as needed for runny nose. Can take 15 minutes prior to meals. 30 mL 3   ipratropium-albuterol  (DUONEB) 0.5-2.5 (3) MG/3ML SOLN 3 mL as needed Inhalation every 6 hrs     levothyroxine  (SYNTHROID ) 112 MCG tablet Take 1 tablet (112 mcg total) by mouth daily. 90 tablet 3   LORazepam  (ATIVAN ) 1 MG tablet TAKE 1 TABLET BY MOUTH TWICE A DAY FOR ANXIETY 60 tablet 2   metoprolol  succinate (TOPROL -XL) 25 MG 24 hr tablet Take 1.5 tablets (37.5 mg total) by mouth daily. 45 tablet 11   Olopatadine  HCl 0.2 % SOLN Apply 1 drop to eye daily as needed. (Patient taking differently: Apply 1 drop to eye daily as  needed (allergies).) 2.5 mL 5   potassium chloride  (KLOR-CON  M) 10 MEQ tablet TAKE 1 TABLET EVERY OTHER DAY WITH LASIX  (FUROSEMIDE ) 15 tablet 1   promethazine  (PHENERGAN ) 25 MG tablet Take 1 tablet (25 mg total) by mouth every 8 (eight) hours as needed for nausea and/or vomiting 30 tablet 5   promethazine -dextromethorphan (PROMETHAZINE -DM) 6.25-15 MG/5ML syrup Take 5 mLs by mouth 4 (four) times daily as needed.     tiZANidine  (ZANAFLEX ) 4 MG tablet Take 1 tablet (4 mg total) by mouth 3 (three) times daily. 90 tablet 3   Ubrogepant  (UBRELVY ) 100 MG TABS Take 1 tablet (100 mg total) by mouth every 2 (two) hours as needed. Maximum 200mg  a day. 16 tablet 11   Vitamin D , Ergocalciferol , (DRISDOL ) 1.25 MG (50000 UNIT) CAPS capsule TAKE 1 CAPSULE BY MOUTH ONE TIME PER WEEK 4 capsule 2   No current facility-administered medications for this visit.   Facility-Administered Medications Ordered in Other Visits  Medication Dose Route Frequency Provider Last Rate Last Admin   gadobenate dimeglumine  (MULTIHANCE ) injection 20 mL  20 mL Intravenous Once PRN Ines Onetha NOVAK, MD         ALLERGIES: Other  Family History  Problem Relation Age of Onset   High blood pressure Mother  Hypertension Mother    Headache Mother    High Cholesterol Mother    Thyroid  disease Mother    Obesity Mother    Kidney disease Mother    Hypertension Father    Diabetes Father    High blood pressure Father    High Cholesterol Father    Heart disease Father    Obesity Father    Diabetes Sister    Hypertension Sister    Thyroid  disease Brother    Thyroid  disease Maternal Aunt    Migraines Neg Hx     Social History   Socioeconomic History   Marital status: Married    Spouse name: Adriana   Number of children: 2   Years of education: Not on file   Highest education level: Not on file  Occupational History   Occupation: Charity fundraiser - Adult nurse GI    Employer: [employer]  Tobacco Use   Smoking status: Never   Smokeless  tobacco: Never  Vaping Use   Vaping status: Never Used  Substance and Sexual Activity   Alcohol use: No   Drug use: No   Sexual activity: Not Currently    Partners: Male    Birth control/protection: Surgical    Comment: hysterectomy  Other Topics Concern   Not on file  Social History Narrative   Lives at home with spouse   Right handed   Caffeine: diet zero mtn dew, occasionally     2025 - 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 Divorced, remarried - husband is disabled. 2 adult twin daughters. Non-smoker or drinker.    Social Drivers of Corporate investment banker Strain: Not on file  Food Insecurity: Low Risk  (07/12/2023)   Received from [facility]   Hunger Vital Sign    Within the past 12 months, you worried that your food would run out before you got money to buy more: Never true    Within the past 12 months, the food you bought just didn't last and you didn't have money to get more. : Never true  Transportation Needs: No Transportation Needs (07/12/2023)   Received from [facility]    In the past 12 months, has lack of reliable transportation kept you from medical appointments, meetings, work or from getting things needed for daily living? : No  Physical Activity: Not on file  Stress: Not on file  Social Connections: Not on file  Intimate Partner Violence: Not At Risk (07/13/2022)   Humiliation, Afraid, Rape, and Kick questionnaire    Fear of Current or Ex-Partner: No    Emotionally Abused: No    Physically Abused: No    Sexually Abused: No    Review of Systems  All other systems reviewed and are negative.   PHYSICAL EXAMINATION:   BP 108/66 (BP Location: Left Arm, Patient Position: Sitting)   Pulse 61   SpO2 97%     General appearance: alert, cooperative and appears stated age   ASSESSMENT:  Vasomotor symptoms.  Neuropathy.  On Gabapentin  600 mg po bid.  Medical stress.  On  Wellbutrin .  Did not tolerate Lexapro  due to increased sweating. Heart failure.    PLAN:  We discussed ERT and risks of stroke, DVT, PE.  Will start weaning off ERT.  Reduce dosage to Vivelle  Dot 0.075 mg twice weekly.  LFTs today. Discussed Veozah and Paxil if Rachell is not tolerated or is not a preferred drug.  Risks and benefits of both drugs reviewed.  Paxil can be used in conjunction to Wellbutrin .  Return for annual exam after Jan. 1, 2026.   30 min  total time was spent for this patient encounter, including preparation, face-to-face counseling with the patient, coordination of care, and documentation of the encounter.

## 2023-09-30 NOTE — Patient Instructions (Addendum)
 Paroxetine Tablets What is this medication? PAROXETINE (pa ROX e teen) treats depression, anxiety, obsessive-compulsive disorder (OCD), post-traumatic stress disorder (PTSD), and premenstrual dysphoric disorder (PMDD). It increases the amount of serotonin in the brain, a hormone that helps regulate mood. It belongs to a group of medications called SSRIs. This medicine may be used for other purposes; ask your health care provider or pharmacist if you have questions. COMMON BRAND NAME(S): Paxil, Pexeva What should I tell my care team before I take this medication? They need to know if you have any of these conditions: Bipolar disorder or a family history of bipolar disorder Bleeding disorder Glaucoma Heart disease Kidney disease Liver disease Low levels of sodium in the blood Seizures Suicidal thoughts, plans, or attempt by you or a family member Take MAOIs, such as Carbex, Eldepryl, Marplan, Nardil, and Parnate Take medications that treat or prevent blood clots Thyroid  disease An unusual or allergic reaction to paroxetine, other medications, foods, dyes, or preservatives Pregnant or trying to get pregnant Breastfeeding How should I use this medication? Take this medication by mouth with water . Take it as directed on the prescription label at the same time every day. You can take it with or without food. If it upsets your stomach, take it with food. Do not take your medication more often than directed. Keep taking this medication unless your care team tells you to stop. Stopping it too quickly can cause serious side effects. It can also make your condition worse. A special MedGuide will be given to you by the pharmacist with each prescription and refill. Be sure to read this information carefully each time. Talk to your care team about the use of this medication in children. Special care may be needed. Overdosage: If you think you have taken too much of this medicine contact a poison control  center or emergency room at once. NOTE: This medicine is only for you. Do not share this medicine with others. What if I miss a dose? If you miss a dose, take it as soon as you can. If it is almost time for your next dose, take only that dose. Do not take double or extra doses. What may interact with this medication? Do not take this medication with any of the following: Linezolid MAOIs, such as Carbex, Eldepryl, Marplan, Nardil, and Parnate Methylene blue (injected into a vein) Pimozide Thioridazine This medication may also interact with the following: Alcohol Amphetamines Aspirin  and aspirin -like medications Atomoxetine Certain medications for irregular heart beat, such as propafenone, flecainide , encainide, and quinidine Certain medications for mental health conditions Certain medications for migraine headache, such as almotriptan, eletriptan, frovatriptan, naratriptan , rizatriptan , sumatriptan , zolmitriptan Cimetidine Digoxin Diuretics Fentanyl  Fosamprenavir Furazolidone Isoniazid Lithium Medications that treat or prevent blood clots, such as warfarin, enoxaparin, and dalteparin Medications for sleep NSAIDs, medications for pain and inflammation, such as ibuprofen or naproxen Phenobarbital Phenytoin Procarbazine Rasagiline Ritonavir  Supplements, such as St. John's wort, kava kava, valerian Tamoxifen Tramadol  Tryptophan This list may not describe all possible interactions. Give your health care provider a list of all the medicines, herbs, non-prescription drugs, or dietary supplements you use. Also tell them if you smoke, drink alcohol, or use illegal drugs. Some items may interact with your medicine. What should I watch for while using this medication? Tell your care team if your symptoms do not get better or if they get worse. Visit your care team for regular checks on your progress. Because it may take several weeks to see the full effects of  this medication, it is  important to continue your treatment as prescribed by your care team. Watch for new or worsening thoughts of suicide or depression. This includes sudden changes in mood, behaviors, or thoughts. These changes can happen at any time but are more common in the beginning of treatment or after a change in dose. Call your care team right away if you experience these thoughts or worsening depression. This medication may cause mood and behavior changes, such as anxiety, nervousness, irritability, hostility, restlessness, excitability, hyperactivity, or trouble sleeping. These changes can happen at any time but are more common in the beginning of treatment or after a change in dose. Call your care team right away if you notice any of these symptoms. This medication may affect your coordination, reaction time, or judgment. Do not drive or operate machinery until you know how this medication affects you. Sit up or stand slowly to reduce the risk of dizzy or fainting spells. Drinking alcohol with this medication can increase the risk of these side effects. Your mouth may get dry. Chewing sugarless gum or sucking hard candy and drinking plenty of water  may help. Contact your care team if the problem does not go away or is severe. What side effects may I notice from receiving this medication? Side effects that you should report to your care team as soon as possible: Allergic reactions--skin rash, itching, hives, swelling of the face, lips, tongue, or throat Bleeding--bloody or black, tar-like stools, red or dark [place] urine, vomiting blood or [place] material that looks like coffee grounds, small, red or purple spots on skin, unusual bleeding or bruising Heart rhythm changes--fast or irregular heartbeat, dizziness, feeling faint or lightheaded, chest pain, trouble breathing Low sodium level--muscle weakness, fatigue, dizziness, headache, confusion Serotonin syndrome--irritability, confusion, fast or irregular heartbeat,  muscle stiffness, twitching muscles, sweating, high fever, seizures, chills, vomiting, diarrhea Sudden eye pain or change in vision such as blurry vision, seeing halos around lights, vision loss Thoughts of suicide or self-harm, worsening mood, feelings of depression Side effects that usually do not require medical attention (report to your care team if they continue or are bothersome): Change in sex drive or performance Diarrhea Excessive sweating Nausea Tremors or shaking Upset stomach This list may not describe all possible side effects. Call your doctor for medical advice about side effects. You may report side effects to FDA at 1-800-FDA-1088. Where should I keep my medication? Keep out of the reach of children and pets. Store at room temperature between 15 and 30 degrees C (59 and 86 degrees F). Keep container tightly closed. Throw away any unused medication after the expiration date. NOTE: This sheet is a summary. It may not cover all possible information. If you have questions about this medicine, talk to your doctor, pharmacist, or health care provider.  2024 Elsevier/Gold Standard (2021-12-18 00:00:00)  Fezolinetant Tablets What is this medication? FEZOLINETANT (FEZ oh LIN e tant) reduces the number and severity of hot flashes due to menopause. It works by blocking substances in your body that cause hot flashes and night sweats. This medicine may be used for other purposes; ask your health care provider or pharmacist if you have questions. COMMON BRAND NAME(S): VEOZAH What should I tell my care team before I take this medication? They need to know if you have any of these conditions: Kidney disease Liver disease An unusual or allergic reaction to fezolinetant, other medications, foods, dyes, or preservatives Pregnant or trying to get pregnant Breastfeeding How should I use  this medication? Take this medication by mouth with water . Take it as directed on the prescription label  at the same time every day. Do not cut, crush, or chew this medication. Swallow the tablets whole. You can take it with or without food. If it upsets your stomach, take it with food. Keep taking it unless your care team tells you to stop. Talk to your care team about the use of this medication in children. Special care may be needed. Overdosage: If you think you have taken too much of this medicine contact a poison control center or emergency room at once. NOTE: This medicine is only for you. Do not share this medicine with others. What if I miss a dose? If you miss a dose, take it as soon as you can unless it is more than 12 hours late. If it is more than 12 hours late, skip the missed dose. Take the next dose at the normal time. What may interact with this medication? Other medications may affect the way this medication works. Talk with your care team about all of the medications you take. They may suggest changes to your treatment plan to lower the risk of side effects and to make sure your medications work as intended. This list may not describe all possible interactions. Give your health care provider a list of all the medicines, herbs, non-prescription drugs, or dietary supplements you use. Also tell them if you smoke, drink alcohol, or use illegal drugs. Some items may interact with your medicine. What should I watch for while using this medication? Visit your care team for regular checks on your progress. Tell your care team if your symptoms do not start to get better or if they get worse. You may need blood work while taking this medication. What side effects may I notice from receiving this medication? Side effects that you should report to your care team as soon as possible: Allergic reactions--skin rash, itching, hives, swelling of the face, lips, tongue, or throat Liver injury--right upper belly pain, loss of appetite, nausea, light-colored stool, dark yellow or [place] urine, yellowing  skin or eyes, unusual weakness or fatigue Side effects that usually do not require medical attention (report these to your care team if they continue or are bothersome): Back pain Diarrhea Hot flashes Stomach pain Trouble sleeping This list may not describe all possible side effects. Call your doctor for medical advice about side effects. You may report side effects to FDA at 1-800-FDA-1088. Where should I keep my medication? Keep out of the reach of children and pets. Store at room temperature between 20 and 25 degrees C (68 and 77 degrees F). Get rid of any unused medication after the expiration date. To get rid of medications that are no longer needed or have expired: Take the medication to a medication take-back program. Check with your pharmacy or law enforcement to find a location. If you cannot return the medication, check the label or package insert to see if the medication should be thrown out in the garbage or flushed down the toilet. If you are not sure, ask your care team. If it is safe to put it in the trash, take the medication out of the container. Mix the medication with cat litter, dirt, coffee grounds, or other unwanted substance. Seal the mixture in a bag or container. Put it in the trash. NOTE: This sheet is a summary. It may not cover all possible information. If you have questions about this medicine, talk  to your doctor, pharmacist, or health care provider.  2024 Elsevier/Gold Standard (2021-07-20 00:00:00)

## 2023-10-01 ENCOUNTER — Ambulatory Visit: Payer: Self-pay | Admitting: Obstetrics and Gynecology

## 2023-10-01 DIAGNOSIS — G4733 Obstructive sleep apnea (adult) (pediatric): Secondary | ICD-10-CM | POA: Diagnosis not present

## 2023-10-01 LAB — HEPATIC FUNCTION PANEL
AG Ratio: 1.3 (calc) (ref 1.0–2.5)
ALT: 17 U/L (ref 6–29)
AST: 17 U/L (ref 10–35)
Albumin: 4 g/dL (ref 3.6–5.1)
Alkaline phosphatase (APISO): 60 U/L (ref 37–153)
Bilirubin, Direct: 0.1 mg/dL (ref 0.0–0.2)
Globulin: 3.1 g/dL (ref 1.9–3.7)
Indirect Bilirubin: 0.3 mg/dL (ref 0.2–1.2)
Total Bilirubin: 0.4 mg/dL (ref 0.2–1.2)
Total Protein: 7.1 g/dL (ref 6.1–8.1)

## 2023-10-02 ENCOUNTER — Ambulatory Visit: Attending: Cardiology | Admitting: Physician Assistant

## 2023-10-02 VITALS — BP 100/68 | HR 60 | Ht 67.0 in | Wt 311.0 lb

## 2023-10-02 DIAGNOSIS — R0609 Other forms of dyspnea: Secondary | ICD-10-CM | POA: Diagnosis not present

## 2023-10-02 DIAGNOSIS — I471 Supraventricular tachycardia, unspecified: Secondary | ICD-10-CM

## 2023-10-02 NOTE — Patient Instructions (Addendum)
 Medication Instructions:  Your physician recommends that you continue on your current medications as directed. Please refer to the Current Medication list given to you today.  *If you need a refill on your cardiac medications before your next appointment, please call your pharmacy*  Lab Work: NONE ordered at this time of appointment   Testing/Procedures: NONE ordered at this time of appointment   Follow-Up: At [facility], you and your health needs are our priority.  As part of our continuing mission to provide you with exceptional heart care, our providers are all part of one team.  This team includes your primary Cardiologist (physician) and Advanced Practice Providers or APPs (Physician Assistants and Nurse Practitioners) who all work together to provide you with the care you need, when you need it.  Your next appointment:    Follow up with Dr. Shlomo April 2026    We recommend signing up for the patient portal called MyChart.  Sign up information is provided on this After Visit Summary.  MyChart is used to connect with patients for Virtual Visits (Telemedicine).  Patients are able to view lab/test results, encounter notes, upcoming appointments, etc.  Non-urgent messages can be sent to your provider as well.   To learn more about what you can do with MyChart, go to [email or web address].   Other Instructions Water  exercises recommended as tolerated

## 2023-10-02 NOTE — Progress Notes (Signed)
 Cardiology Office Note   Date:  10/02/2023  ID:  Victoria [name], DOB 08/31/1960, MRN [id number] PCP: Perri Ronal PARAS, MD  [facility] Providers Cardiologist:  Wilbert Bihari, MD Electrophysiologist:  Eulas FORBES Furbish, MD     History of Present Illness Victoria [name] is a 63 y.o. female with past medical history of GERD, hiatal hernia, OSA on CPAP and chest pain.  Remote Myoview in 2015 was low risk with very small anterior perfusion defect at that likely breast attenuation artifact, less likely perfusion abnormality in the distribution of diagonal vessel.  EF was normal.  She underwent a gastric bypass surgery in 2016 and has not felt well since then.  She gets dyspneic with exertion and also talking.  She had ETT in March 2018 and again in May 2022, there was no significant EKG changes.  However during the last ETT in 2022, she was unable to achieve target heart rate.  Coronary CTA obtained on 04/15/2020 showed a coronary calcium  score of 0, normal coronary arteries.  Echocardiogram at the time showed EF 65 to 70%, trivial MR.  Heart monitor in March 2022 showed PVCs, PACs, nonsustained atrial tachycardia and nonsustained VT.  Patient was last seen by Dr. Bihari in April 2025 at which time she was feeling poorly.  She had a knee surgery last year and has gained roughly 440 pounds.  She was also diagnosed was lymphocytic colitis and was on immunosuppression.  Shortness of breath has worsened since weight gain.  She gets dyspneic with minimal activity.  She is on flecainide  and metoprolol  succinate for palpitation and nonsustained atrial tachycardia.  Dr. Bihari recommended 2-week Zio patch and PET stress test.  D-dimer was elevated, subsequent coronary CTA was negative for PE but showed mild bronchial wall thickening and bronchomalacia involving right bronchus.  PET stress test obtained on 09/17/2023 showed no evidence of infarction, no coronary calcium , small area of apical  ischemia in stress without change in the stress flows or function, this was likely artifactual, overall low risk study.  Her monitor showed multiple episode of SVT lasting as long as 20 beats with fastest heart rate 188 corresponds to the patient's symptom of palpitation.  Metoprolol  succinate was increased to 37.5 mg daily.  She was referred to EP service for further evaluation.  Echocardiogram obtained on 07/15/2023 showed EF 70 to 75%, no Victoria wall motion abnormality, normal RV, mild MR, mild to moderate TR. since the last visit, patient was seen by Dr. Furbish of EP service on 08/07/2023, her atrial ectopy burden was less than 1%, therefore Dr. Furbish recommended to stop the flecainide  and monitor her symptoms.  She may ended up needing another heart monitor in 6 months to reassess the atrial ectopy.  Patient presents today for evaluation of dyspnea on exertion.  She says she started on Lasix  every other day at the instruction of pulmonology service for leg edema and shortness of breath.  She has noticed slightly improvement in her breathing.  She also mentioned her leg edema improved with leg elevation.  I suspect her leg edema is more dependent edema due to weight and venous insufficiency.  She denies any chest pain.  We ambulated her in the hallway, she became extremely dyspneic after walking only 30 yards.  However at the same time, O2 saturation stayed in and 94% or above throughout the entire walk.  I still suspect her symptom is more related to severe obesity and deconditioning.  I think she  will be a good candidate to consider water  aerobic.  At this time, I do not recommend any further cardiac workup for her dyspnea on exertion.  She can follow-up with Dr. Shlomo as previously recommended in April next year.  Prior to that, she will likely be seen by EP service to consider heart monitor after flecainide  was discontinued.  ROS:   Patient complains of dyspnea on exertion.  She has occasional lower  extremity edema that is improved with low-dose Lasix .  Studies Reviewed      Cardiac Studies & Procedures   ______________________________________________________________________________________________   STRESS TESTS  NM PET CT CARDIAC PERFUSION MULTI W/ABSOLUTE BLOODFLOW 09/17/2023  Narrative   Findings are consistent with no infarction.  There is no coronary calcium .  There is a small area of apical ischemia in stress without change in stress flows or function.  This may be artifactual in nature. The study is low risk.   LV perfusion is abnormal. Defect 1: There is a small defect with mild reduction in uptake present in the apical apex location(s) that is reversible. There is normal wall motion in the defect area. Consistent with artifact.   Rest left ventricular function is normal. Stress left ventricular function is normal. End diastolic cavity size is normal. End systolic cavity size is normal.   Myocardial blood flow was computed to be 0.25ml/g/min at rest and 1.56ml/g/min at stress. Global myocardial blood flow reserve was 2.00 and was normal.   Coronary calcium  was absent on the attenuation correction CT images.   Electronically Signed  By: Stanly Leavens M.D.  EXAM: OVER-READ INTERPRETATION  CT CHEST  The following report is a limited chest CT over-read performed by radiologist Dr. Elsie Ko [facility] on 09/17/2023. This over-read does not include interpretation of cardiac or coronary anatomy or pathology nor does it include evaluation of the PET data. The cardiac PET-CT interpretation by the cardiologist is attached.  COMPARISON:  Chest radiograph 09/13/2023.  Chest CTA 06/12/2023  FINDINGS: Mediastinum/Nodes: No enlarged lymph nodes within the visualized mediastinum.  Lungs/Pleura: There is no pleural effusion. Atelectasis at both lung bases, similar to previous CT. Persistent low lung volumes.  Upper abdomen: No acute findings within the  visualized upper abdomen. Postsurgical changes from gastric bypass procedure.  Musculoskeletal/Chest wall: No chest wall mass or suspicious osseous findings within the visualized chest. Chronic endplate sclerosis within the lower thoracic spine.  IMPRESSION: 1. No acute extra cardiac findings. 2. Persistent low lung volumes with bibasilar atelectasis.   Electronically Signed By: Elsie Perone M.D. On: 09/17/2023 11:29   ECHOCARDIOGRAM  ECHOCARDIOGRAM COMPLETE 07/15/2023  Narrative ECHOCARDIOGRAM REPORT    Patient Name:   Victoria [name] Date of Exam: 07/15/2023 Medical Rec #:  [id number]                 Height:       67.0 in Accession #:    [id number]                Weight:       301.0 lb Date of Birth:  Feb 25, 1961                BSA:          2.406 m Patient Age:    62 years                  BP:           100/80 mmHg Patient Gender: F  HR:           69 bpm. Exam Location:  [address]  Procedure: 2D Echo, 3D Echo and Strain Analysis (Both Spectral and Color Flow Doppler were utilized during procedure).  Indications:    R06.02 SOB  History:        Patient has prior history of Echocardiogram examinations, most recent 10/12/2021. Signs/Symptoms:Fatigue. Palpitations. Prediabetes. Morbid obesity.  Sonographer:    Jon Hacker RCS Referring Phys: [phone] TRACI R TURNER  IMPRESSIONS   1. Left ventricular ejection fraction, by estimation, is 70 to 75%. The left ventricle has hyperdynamic function. The left ventricle has no Victoria wall motion abnormalities. Left ventricular diastolic parameters were normal. The average left ventricular global longitudinal strain is 18.6 %. The global longitudinal strain is normal. 2. Right ventricular systolic function is normal. The right ventricular size is normal. 3. The mitral valve is normal in structure. Mild mitral valve regurgitation. 4. Tricuspid valve regurgitation is mild to moderate. 5. The  aortic valve is tricuspid. Aortic valve regurgitation is not visualized. 6. The inferior vena cava is normal in size with greater than 50% respiratory variability, suggesting right atrial pressure of 3 mmHg.  Comparison(s): The left ventricular function is unchanged.  FINDINGS Left Ventricle: Left ventricular ejection fraction, by estimation, is 70 to 75%. The left ventricle has hyperdynamic function. The left ventricle has no Victoria wall motion abnormalities. The average left ventricular global longitudinal strain is 18.6 %. Strain was performed and the global longitudinal strain is normal. The left ventricular internal cavity size was normal in size. There is no left ventricular hypertrophy. Left ventricular diastolic parameters were normal.  Right Ventricle: The right ventricular size is normal. Right vetricular wall thickness was not assessed. Right ventricular systolic function is normal.  Left Atrium: Left atrial size was normal in size.  Right Atrium: Right atrial size was normal in size.  Pericardium: There is no evidence of pericardial effusion.  Mitral Valve: The mitral valve is normal in structure. Mild mitral valve regurgitation.  Tricuspid Valve: The tricuspid valve is normal in structure. Tricuspid valve regurgitation is mild to moderate.  Aortic Valve: The aortic valve is tricuspid. Aortic valve regurgitation is not visualized.  Pulmonic Valve: The pulmonic valve was not well visualized. Pulmonic valve regurgitation is not visualized. No evidence of pulmonic stenosis.  Aorta: The aortic root and ascending aorta are structurally normal, with no evidence of dilitation.  Venous: The inferior vena cava is normal in size with greater than 50% respiratory variability, suggesting right atrial pressure of 3 mmHg.  IAS/Shunts: No atrial level shunt detected by color flow Doppler.   LEFT VENTRICLE PLAX 2D LVIDd:         4.75 cm   Diastology LVIDs:         2.69 cm   LV e'  medial:    10.90 cm/s LV PW:         1.16 cm   LV E/e' medial:  11.5 LV IVS:        0.96 cm   LV e' lateral:   12.10 cm/s LVOT diam:     2.10 cm   LV E/e' lateral: 10.3 LV SV:         112 LV SV Index:   46        2D Longitudinal Strain LVOT Area:     3.46 cm  2D Strain GLS (A4C):   -20.8 % 2D Strain GLS (A3C):   15.9 % 2D Strain GLS (A2C):   -  25.9 % 2D Strain GLS Avg:     18.6 %  3D Volume EF: 3D EF:        58 % LV EDV:       144 ml LV ESV:       61 ml LV SV:        83 ml  RIGHT VENTRICLE RV Basal diam:  3.86 cm RV S prime:     10.90 cm/s TAPSE (M-mode): 2.4 cm  LEFT ATRIUM             Index        RIGHT ATRIUM           Index LA diam:        3.20 cm 1.33 cm/m   RA Area:     15.90 cm LA Vol (A2C):   45.6 ml 18.95 ml/m  RA Volume:   47.60 ml  19.78 ml/m LA Vol (A4C):   45.8 ml 19.04 ml/m LA Biplane Vol: 48.9 ml 20.32 ml/m AORTIC VALVE LVOT Vmax:   144.00 cm/s LVOT Vmean:  87.600 cm/s LVOT VTI:    0.322 m  AORTA Ao Root diam: 3.70 cm Ao Asc diam:  3.80 cm  MITRAL VALVE MV Area (PHT): 4.31 cm     SHUNTS MV Decel Time: 176 msec     Systemic VTI:  0.32 m MV E velocity: 125.00 cm/s  Systemic Diam: 2.10 cm MV A velocity: 99.60 cm/s MV E/A ratio:  1.26  Vina Gull MD Electronically signed by Vina Gull MD Signature Date/Time: 07/15/2023/4:50:24 PM    Final    MONITORS  LONG TERM MONITOR (3-14 DAYS) 07/08/2023  Narrative   Predominant rhythm was normal sinus rhythm with an average heartbeat of 69 bpm and range from 46 to 188 bpm.   26 episodes of SVT longest lasting 20 beats with max heart rate 180 bpm.  Patient was symptomatic during these episodes   Rare PACs, atrial couplets and triplets   Rare PVCs   Patch Wear Time:  12 days and 20 hours (2025-04-19T14:50:46-0400 to 2025-05-02T11:34:47-0400)  Patient had a min HR of 46 bpm, max HR of 188 bpm, and avg HR of 69 bpm. Predominant underlying rhythm was Sinus Rhythm. 26 Supraventricular Tachycardia runs  occurred, the run with the fastest interval lasting 6 beats with a max rate of 188 bpm, the longest lasting 20 beats with an avg rate of 120 bpm. Supraventricular Tachycardia was detected within +/- 45 seconds of symptomatic patient event(s). Isolated SVEs were rare (<1.0%), SVE Couplets were rare (<1.0%), and SVE Triplets were rare (<1.0%). Isolated VEs were rare (<1.0%), and no VE Couplets or VE Triplets were present.       ______________________________________________________________________________________________      Risk Assessment/Calculations           Physical Exam VS:  BP 100/68 (BP Location: Left Arm, Patient Position: Sitting, Cuff Size: Large)   Pulse 60   Ht 5' 7 (1.702 m)   Wt (!) 311 lb (141.1 kg)   SpO2 97%   BMI 48.71 kg/m        Wt Readings from Last 3 Encounters:  10/02/23 (!) 311 lb (141.1 kg)  09/23/23 (!) 319 lb 3.2 oz (144.8 kg)  09/13/23 (!) 312 lb (141.5 kg)    GEN: Well nourished, well developed in no acute distress NECK: No JVD; No carotid bruits CARDIAC: RRR, no murmurs, rubs, gallops RESPIRATORY:  Clear to auscultation without rales, wheezing or rhonchi  ABDOMEN: Soft, non-tender,  non-distended EXTREMITIES:  No edema; No deformity   ASSESSMENT AND PLAN  Dyspnea on exertion: Suspect related to obesity and the severe deconditioning.  CTA was negative for PE.  Recent PET stress test was normal.  Previous coronary CTA in 2022 showed normal coronary arteries.  Recent proBNP was also normal as well.  I ambulated her in the office, she became extremely dyspneic after 30 yards, O2 saturation however was 94% above throughout the entire walk.  I recommended water  aerobic to build up tolerance.  At this time I do not recommend any further workup.  History of SVT: Seen on previous heart monitor.  Seen by Dr. Cleotilde who discontinued her flecainide  and plan to reassess in 6 months and consider repeat heart monitor at that time.       Dispo: Follow-up with  Dr. Shlomo as previously recommended.  Signed, Scot Ford, PA

## 2023-10-04 NOTE — Telephone Encounter (Signed)
-----   Message from Wilbert Bihari sent at 10/03/2023  9:32 AM EDT ----- High-resolution chest CT ----- Message ----- From: Janit Geni CROME, RN Sent: 10/02/2023   2:54 PM EDT To: Wilbert JONELLE Bihari, MD  I think my brain is on strike-what does HRCT stand for? ----- Message ----- From: Bihari Wilbert JONELLE, MD Sent: 09/28/2023   6:18 PM EDT To: Geni CROME Janit, RN; Donnice JONELLE Beals, MD  Her BNP has been normal multiple times except for 1 instance in may where it was minimally elevated.  Chest CT with no edema.  Normal diastolic function on recent echo and normal LVF.  Her RAP was  normal at 3 as well.  I am not convinced she has diastolic HF.  Doesn't look like she got her HRCT.   ----- Message ----- From: Interface, [facility] Lab Results In Sent: 09/27/2023   4:36 AM EDT To: Wilbert JONELLE Bihari, MD

## 2023-10-04 NOTE — Telephone Encounter (Signed)
 Call to patient to discuss lab results. No answer, left detailed VM per DPR. MC also sent. LVM to advise that Dr. Shlomo reviewed her BNP results which have been normal multiple times except for 1 instance in may where it was minimally elevated.  Chest CT with no edema.  Normal diastolic function on recent echo and normal LVF. Advised that it doesn't look like she got her HRCT, which was scheduled 10/07/23 then canceled. Advised I would reorder high resolution chest CT.

## 2023-10-07 ENCOUNTER — Ambulatory Visit (HOSPITAL_BASED_OUTPATIENT_CLINIC_OR_DEPARTMENT_OTHER)

## 2023-10-07 ENCOUNTER — Ambulatory Visit
Admission: RE | Admit: 2023-10-07 | Discharge: 2023-10-07 | Disposition: A | Discharge status: Home / Self Care | Source: Ambulatory Visit | Attending: Pulmonary Disease | Admitting: Pulmonary Disease

## 2023-10-07 DIAGNOSIS — R0602 Shortness of breath: Secondary | ICD-10-CM | POA: Diagnosis not present

## 2023-10-07 DIAGNOSIS — R0609 Other forms of dyspnea: Secondary | ICD-10-CM

## 2023-10-11 ENCOUNTER — Ambulatory Visit (INDEPENDENT_AMBULATORY_CARE_PROVIDER_SITE_OTHER)

## 2023-10-11 ENCOUNTER — Ambulatory Visit (INDEPENDENT_AMBULATORY_CARE_PROVIDER_SITE_OTHER): Admitting: Podiatry

## 2023-10-11 DIAGNOSIS — G629 Polyneuropathy, unspecified: Secondary | ICD-10-CM

## 2023-10-11 DIAGNOSIS — M722 Plantar fascial fibromatosis: Secondary | ICD-10-CM

## 2023-10-11 DIAGNOSIS — M778 Other enthesopathies, not elsewhere classified: Secondary | ICD-10-CM | POA: Diagnosis not present

## 2023-10-11 MED ORDER — TRIAMCINOLONE ACETONIDE 10 MG/ML IJ SUSP
10.0000 mg | Freq: Once | INTRAMUSCULAR | Status: AC
  Administered 2023-10-11: 10 mg via INTRA_ARTICULAR

## 2023-10-11 MED ORDER — GABAPENTIN 100 MG PO CAPS: 100.0000 mg | ORAL_CAPSULE | Freq: Three times a day (TID) | ORAL | 3 refills | Status: DC

## 2023-10-11 NOTE — Progress Notes (Signed)
 Subjective:   Patient ID: Victoria [name], female   DOB: 63 y.o.   MRN: [id number]   HPI Patient states both feet are hurting her and she thinks she is probably getting need orthotics but she is having trouble standing she does take 1 gabapentin  at night but is never taking it any during the day   Review of Systems  All other systems reviewed and are negative.       Objective:  Physical Exam Vitals and nursing note reviewed.  Constitutional:      Appearance: She is well-developed.  Pulmonary:     Effort: Pulmonary effort is normal.  Musculoskeletal:        General: Normal range of motion.  Skin:    General: Skin is warm.  Neurological:     Mental Status: She is alert.     Neurovascular status intact muscle strength found to be adequate range of motion within normal limits with patient found to have discomfort in the plantar fascia into the arch and also noted to have multiple signs of neurological issues with shooting burning like discomfort that is occurring.  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

## 2023-10-12 ENCOUNTER — Ambulatory Visit: Payer: Self-pay | Admitting: Pulmonary Disease

## 2023-10-12 ENCOUNTER — Encounter: Payer: Self-pay | Admitting: Pulmonary Disease

## 2023-10-14 NOTE — Telephone Encounter (Signed)
 Please advise on heart cath

## 2023-10-14 NOTE — Telephone Encounter (Signed)
 FYI

## 2023-10-15 DIAGNOSIS — Z0289 Encounter for other administrative examinations: Secondary | ICD-10-CM

## 2023-10-17 ENCOUNTER — Ambulatory Visit (INDEPENDENT_AMBULATORY_CARE_PROVIDER_SITE_OTHER): Admitting: Bariatrics

## 2023-10-17 ENCOUNTER — Encounter: Payer: Self-pay | Admitting: Bariatrics

## 2023-10-17 VITALS — BP 131/87 | HR 64 | Temp 98.2°F | Ht 66.5 in | Wt 308.0 lb

## 2023-10-17 DIAGNOSIS — G4733 Obstructive sleep apnea (adult) (pediatric): Secondary | ICD-10-CM

## 2023-10-17 DIAGNOSIS — E66813 Obesity, class 3: Secondary | ICD-10-CM | POA: Diagnosis not present

## 2023-10-17 DIAGNOSIS — R5383 Other fatigue: Secondary | ICD-10-CM

## 2023-10-17 DIAGNOSIS — F5089 Other specified eating disorder: Secondary | ICD-10-CM

## 2023-10-17 DIAGNOSIS — E039 Hypothyroidism, unspecified: Secondary | ICD-10-CM

## 2023-10-17 DIAGNOSIS — Z0000 Encounter for general adult medical examination without abnormal findings: Secondary | ICD-10-CM

## 2023-10-17 DIAGNOSIS — Z1331 Encounter for screening for depression: Secondary | ICD-10-CM

## 2023-10-17 DIAGNOSIS — R0602 Shortness of breath: Secondary | ICD-10-CM | POA: Diagnosis not present

## 2023-10-17 DIAGNOSIS — Z9884 Bariatric surgery status: Secondary | ICD-10-CM

## 2023-10-17 DIAGNOSIS — E559 Vitamin D deficiency, unspecified: Secondary | ICD-10-CM

## 2023-10-17 DIAGNOSIS — Z6841 Body Mass Index (BMI) 40.0 and over, adult: Secondary | ICD-10-CM | POA: Diagnosis not present

## 2023-10-17 DIAGNOSIS — R7303 Prediabetes: Secondary | ICD-10-CM

## 2023-10-17 MED ORDER — TOPIRAMATE 50 MG PO TABS: 50.0000 mg | ORAL_TABLET | Freq: Every day | ORAL | 0 refills | Status: DC

## 2023-10-17 NOTE — Progress Notes (Signed)
 At a Glance:  Vitals Temp: 98.2 F (36.8 C) BP: 131/87 Pulse Rate: 64 SpO2: 100 %   Anthropometric Measurements Height: 5' 6.5 (1.689 m) Weight: (!) 308 lb (139.7 kg) BMI (Calculated): 48.97 Starting Weight: 308lb   Body Composition  Body Fat %: 54.9 % Fat Mass (lbs): 169.6 lbs Muscle Mass (lbs): 132.2 lbs Visceral Fat Rating : 21   Other Clinical Data RMR: 1958 Fasting: yes Labs: yes Today's Visit #: 1 Starting Date: 10/17/23     Indirect Calorimeter:   Resting Metabolic Rate ( RMR):  RMR (actual): 1958 kcal RMR (calculated): 2038 kcal The calculated basal metabolic rate is 7961 kcal  thus her basal metabolic rate is worse than expected.  Plan:   Indirect calorimeter completed, interpreted and reviewed with patient today and allowed to ask questions.  Discussed the implications for the chosen plan and exercise based on the RMR reading.  Will consider repeating the RMR in the future based on weight loss.    Chief Complaint:  Obesity   Subjective:  Victoria [name] (MR# [id number]) is a 63 y.o. female who presents for evaluation and treatment of obesity and related comorbidities.   Victoria [name] is currently in the action stage of change and ready to dedicate time achieving and maintaining a healthier weight. Victoria [name] is interested in becoming our patient and working on intensive lifestyle modifications including (but not limited to) diet and exercise for weight loss.  Victoria [name] has been struggling with her weight. She has been unsuccessful in either losing weight, maintaining weight loss, or reaching her healthy weight goal.  Victoria [name]'s habits were reviewed today and are as follows: Her family eats meals together, she thinks her family will eat healthier with her, she has been heavy most of her life, she has significant food cravings issues, she skips meals frequently, and she frequently eats larger portions than normal.  Current or previous  pharmacotherapy: Is interested in pharmacotherapy and Phentermine  Response to medication: Had side effects so it was discontinued  Other Fatigue Victoria [name] admits to daytime somnolence and admits to waking up still tired. Patient has a history of symptoms of morning fatigue. Victoria [name] generally gets 5 or 6 hours of sleep per night, and states that she has nightime awakenings. Snoring is present. Apneic episodes are present. Epworth Sleepiness Score is 6.   Shortness of Breath Victoria [name] notes increasing shortness of breath with exercising and seems to be worsening over time with weight gain. She notes getting out of breath sooner with activity than she used to. This has gotten worse recently. Victoria [name] denies shortness of breath at rest or orthopnea.  Depression Screen Victoria [name]'s Food and Mood (modified PHQ-9) score was 22. 20-27 severe depression     07/08/2023   11:46 AM  Depression screen PHQ 2/9  Decreased Interest 2  Down, Depressed, Hopeless 1  PHQ - 2 Score 3  Altered sleeping 3  Tired, decreased energy 3  Change in appetite 3  Feeling bad or failure about yourself  0  Trouble concentrating 0  Moving slowly or fidgety/restless 3  Suicidal thoughts 0  PHQ-9 Score 15  Difficult doing work/chores Very difficult     Assessment and Plan:   Other Fatigue Victoria [name] does feel that her weight is causing her energy to be lower than it should be. Fatigue may be related to obesity, depression or many other causes. Labs will be ordered, and in the meanwhile, Larrie will focus on self care including making healthy food choices, increasing  physical activity and focusing on stress reduction.  Shortness of Breath Victoria [name] does feel that she gets out of breath more easily that she used to when she exercises. Victoria [name]'s shortness of breath appears to be obesity related and exercise induced. She has agreed to work on weight loss and gradually increase exercise to treat her exercise induced shortness of breath.  Will continue to monitor closely.  Health Maintenance:   Obesity   Plan: Will do indirect calorimetry, and labs.  She had an EKG on 08/07/2023.    Vitamin D  Deficiency Vitamin D  is at goal of 50.  Most recent vitamin D  level was 51. She is at risk for vitamin D  deficiency due to obesity.  She is on  prescription ergocalciferol  50,000 IU weekly. Lab Results  Component Value Date   VD25OH 51 07/09/2023   VD25OH 43 03/18/2023   VD25OH 50.1 02/20/2022    Plan: Continue prescription vitamin D  50,000 IU weekly.   History of Bariatric Surgery ( gastric bypass  )   Year: 2016 Complications: low blood glucose Highest Weight: 292 lbs Lowest Weight: 192 lbs Taking vitamins: no  Hx of deficiencies: anemia  Hx of iron  infusions:  multiple, last iron  infusion in July 2025.   Plan Counseling You may need to eat 3 meals and 2 snacks, or 5 small meals each day in order to reach your protein and calorie goals.  Allow at least 15 minutes for each meal so that you can eat mindfully. Listen to your body so that you do not overeat. For most people, your sleeve or pouch will comfortably hold 4-6 ounces. Eat foods from all food groups. This includes fruits and vegetables, grains, dairy, and meat and other proteins. Include a protein-rich food at every meal and snack, and eat the protein food first.  She will resume a  Multivitamin as well as calcium .   Will check for vitamin D  deficiency.   Obstructive Sleep Apnea [name] has a diagnosis of sleep apnea. She reports that she is using a CPAP regularly. Reports restful sleep.   Plan: Continue CPAP therapy. Continue to practice good sleep hygiene.  Will add in elements of an antiinflammatory diet and  add in elements of a Mediterranean diet.  Reduce intake of carbohydrates for weight loss.   Hypothyroidism Stable.  Does not report symptoms associated with uncontrolled hypothyroidism. Medication(s): Levothyroxine  112 mcg daily. Lab Results   Component Value Date   TSH 0.84 07/04/2023    Plan: Continue levothyroxine  at current dose. Counseling: The correct way to take levothyroxine  is fasting, with water , separated by at least 30 minutes from breakfast, and separated by more than 4 hours from calcium , iron , multivitamins, acid reflux medications (PPIs).     Eating disorder/emotional eating Tyja has had issues with stress eating, emotional eating, and boredom eating. Currently this is poorly controlled. Overall mood is stable. Denies suicidal/homicidal ideation. Medication(s): none  Plan:  Motivational interviewing as well as evidence-based interventions for health behavior change were utilized today including the discussion of self monitoring techniques, problem-solving barriers and SMART goal setting techniques.  Medication(s): Topamax  50 mg daily with dinner Discussed distractions to curb eating behaviors. Discussed activities to do with one's hands in the evening  Be sure to get adequate rest as lack of rest can trigger appetite.  Have plan in place for stressful events.  Consider other rewards besides food.    Prediabetes Last A1c was 6.0  Medication(s): none Lab Results  Component Value Date   HGBA1C  6.0 07/04/2023   HGBA1C 6.1 (H) 12/20/2022   HGBA1C 5.7 (H) 07/05/2022   HGBA1C 5.6 05/24/2022   HGBA1C 5.8 (H) 02/20/2022   Lab Results  Component Value Date   INSULIN  7.1 05/24/2022   INSULIN  5.4 02/20/2022   INSULIN  10.0 11/20/2021   INSULIN  6.2 07/07/2020   INSULIN  4 09/16/2015    Plan: Information sheet on  Insulin  Resistance and Prediabetes.  Will minimize all refined carbohydrates both sweets and starches.  Will work on the plan and exercise.  Consider both aerobic and resistance training.  Will keep protein, water , and fiber intake high.  Increase Polyunsaturated and Monounsaturated fats to increase satiety and encourage weight loss.  Aim for 7 to 9 hours of sleep nightly.    Mikayah had a  positive depression screening. Depression is commonly associated with obesity and often results in emotional eating behaviors. We will monitor this closely and work on CBT to help improve the non-hunger eating patterns. Referral to Psychology may be required if no improvement is seen as she continues in our clinic.    Previous labs reviewed today. Date: 09/26/23 and 09/30/23 BMP, glucose, and albumin  Labs done today CMP, Lipids, Insulin , HgbA1c, Vit D, Vit B12, Thyroid  Panel, and vitamin B 1 .    Morbid Obesity: BMI (Calculated): 48.97   Linzey is currently in the action stage of change and her goal is to begin weight loss efforts. I recommend Raghad begin the structured treatment plan as follows:  She has agreed to Category 3 Plan  Exercise goals: All adults should avoid inactivity. Some activity is better than none, and adults who participate in any amount of physical activity, gain some health benefits.  Behavioral modification strategies:increasing lean protein intake, decreasing simple carbohydrates, increasing vegetables, increase H2O intake, increase high fiber foods, decreasing eating out, ways to avoid nighttime snacking, better snacking choices, and avoiding temptations  She was informed of the importance of frequent follow-up visits to maximize her success with intensive lifestyle modifications for her multiple health conditions. She was informed we would discuss her lab results at her next visit unless there is a critical issue that needs to be addressed sooner. Yasamin agreed to keep her next visit at the agreed upon time to discuss these results.  Objective:  General: Cooperative, alert, well developed, in no acute distress. HEENT: Conjunctivae and lids unremarkable. Cardiovascular: Regular rhythm.  Lungs: Normal work of breathing. Neurologic: No focal deficits.   Lab Results  Component Value Date   CREATININE 1.25 (H) 09/26/2023   BUN 19 09/26/2023   NA 141 09/26/2023    K 4.2 09/26/2023   CL 102 09/26/2023   CO2 18 (L) 09/26/2023   Lab Results  Component Value Date   ALT 17 09/30/2023   AST 17 09/30/2023   ALKPHOS 57 07/04/2023   BILITOT 0.4 09/30/2023   Lab Results  Component Value Date   HGBA1C 6.0 07/04/2023   HGBA1C 6.1 (H) 12/20/2022   HGBA1C 5.7 (H) 07/05/2022   HGBA1C 5.6 05/24/2022   HGBA1C 5.8 (H) 02/20/2022   Lab Results  Component Value Date   INSULIN  7.1 05/24/2022   INSULIN  5.4 02/20/2022   INSULIN  10.0 11/20/2021   INSULIN  6.2 07/07/2020   INSULIN  4 09/16/2015   Lab Results  Component Value Date   TSH 0.84 07/04/2023   Lab Results  Component Value Date   CHOL 220 (H) 07/04/2023   HDL 89.40 07/04/2023   LDLCALC 116 (H) 07/04/2023   TRIG 73.0 07/04/2023  CHOLHDL 2 07/04/2023   Lab Results  Component Value Date   WBC 7.7 09/05/2023   HGB 12.5 09/05/2023   HCT 39.3 09/05/2023   MCV 99.7 09/05/2023   PLT 226 09/05/2023   Lab Results  Component Value Date   IRON  45 07/29/2023   TIBC 451 (H) 07/29/2023   FERRITIN 13 07/29/2023    Attestation Statements:  Applicable history such as the following:  allergies, medications, problem list, medical history, surgical history, family history, social history, and previous encounter notes reviewed by clinician on day of visit:  Time spent on visit in care of the patient today including the items listed below was 50 minutes.    20 minutes were spent talking about the history, 25 minutes for face to face counseling implementing the plan, discussing the specifics of how to arrange meals, meal planning, water  intake.   I spent face to face time discussing his/her plan, including breakfast, additional breakfast options, lunch, and dinner options, grocery list, and snacks.  I reviewed her indirect calorimetry. I discussed the implications for the diet plan.    Discussed the bio-impedence test (fat %, muscle mass, and water  weight) and allowed the patient to ask questions.    Discussed the following information sheets: Category 3, Grocery List, 100 Calorie Snacks, and 200 Calorie Snacks. And discussed and reviewed yogurts with low or no additives through [employer].    I reviewed the labs which were ordered from her visit on 09/30/23,   I additionally spent time documenting, reviewing, and checking the codes before submitting.   This may have been prepared with the assistance of Engineer, civil (consulting).  Occasional wrong-word or sound-a-like substitutions may have occurred due to the inherent limitations of voice recognition software.    Clayborne Daring, DO

## 2023-10-18 ENCOUNTER — Other Ambulatory Visit: Payer: Self-pay

## 2023-10-18 DIAGNOSIS — I5032 Chronic diastolic (congestive) heart failure: Secondary | ICD-10-CM

## 2023-10-21 ENCOUNTER — Encounter: Payer: Self-pay | Admitting: Pulmonary Disease

## 2023-10-24 ENCOUNTER — Other Ambulatory Visit (HOSPITAL_COMMUNITY): Payer: Self-pay

## 2023-10-24 ENCOUNTER — Telehealth: Payer: Self-pay

## 2023-10-24 ENCOUNTER — Encounter: Payer: Self-pay | Admitting: Family

## 2023-10-24 NOTE — Telephone Encounter (Signed)
 Amy- this pt is not on Em[employer]  therapy correct? No PA needed?

## 2023-10-24 NOTE — Telephone Encounter (Signed)
 I received a pa request via fax-PT already has an active PA on file-per test claim PT can fill and get med for a zero copay.    However if PT needs to restart with loading dose I would need to do a PA for loading dose unless the office can provide PT with samples. Thanks.

## 2023-10-24 NOTE — Telephone Encounter (Signed)
 Victoria [name], per last notes, she was not on [employer] . Not on med list either. Did the pt try to fill this recently and that prompted a PA request?   Per last note 07/09/23: You could look at [email or web address] and see if the savings card could reduce cost of [employer]  should we need to restart. You could also call your payer to see if Ajovy or [employer] is covered any better. Reach out if you need me

## 2023-10-25 ENCOUNTER — Ambulatory Visit: Payer: Self-pay

## 2023-10-25 ENCOUNTER — Ambulatory Visit (INDEPENDENT_AMBULATORY_CARE_PROVIDER_SITE_OTHER): Admitting: Podiatry

## 2023-10-25 ENCOUNTER — Encounter: Payer: Self-pay | Admitting: Podiatry

## 2023-10-25 VITALS — Ht 63.0 in | Wt 308.0 lb

## 2023-10-25 DIAGNOSIS — M722 Plantar fascial fibromatosis: Secondary | ICD-10-CM | POA: Diagnosis not present

## 2023-10-25 DIAGNOSIS — J209 Acute bronchitis, unspecified: Secondary | ICD-10-CM | POA: Diagnosis not present

## 2023-10-25 DIAGNOSIS — R0602 Shortness of breath: Secondary | ICD-10-CM | POA: Diagnosis not present

## 2023-10-25 DIAGNOSIS — R051 Acute cough: Secondary | ICD-10-CM | POA: Diagnosis not present

## 2023-10-25 LAB — COMPREHENSIVE METABOLIC PANEL WITH GFR
ALT: 16 IU/L (ref 0–32)
AST: 17 IU/L (ref 0–40)
Albumin: 4.2 g/dL (ref 3.9–4.9)
Alkaline Phosphatase: 73 IU/L (ref 44–121)
BUN/Creatinine Ratio: 15 (ref 12–28)
BUN: 18 mg/dL (ref 8–27)
Bilirubin Total: 0.4 mg/dL (ref 0.0–1.2)
CO2: 23 mmol/L (ref 20–29)
Calcium: 9.1 mg/dL (ref 8.7–10.3)
Chloride: 103 mmol/L (ref 96–106)
Creatinine, Ser: 1.17 mg/dL — ABNORMAL HIGH (ref 0.57–1.00)
Globulin, Total: 3 g/dL (ref 1.5–4.5)
Glucose: 81 mg/dL (ref 70–99)
Potassium: 4.3 mmol/L (ref 3.5–5.2)
Sodium: 140 mmol/L (ref 134–144)
Total Protein: 7.2 g/dL (ref 6.0–8.5)
eGFR: 53 mL/min/1.73 — ABNORMAL LOW (ref >59)

## 2023-10-25 LAB — LIPID PANEL WITH LDL/HDL RATIO
Cholesterol, Total: 224 mg/dL — ABNORMAL HIGH (ref 100–199)
HDL: 92 mg/dL (ref >39)
LDL Chol Calc (NIH): 121 mg/dL — ABNORMAL HIGH (ref 0–99)
LDL/HDL Ratio: 1.3 ratio (ref 0.0–3.2)
Triglycerides: 64 mg/dL (ref 0–149)
VLDL Cholesterol Cal: 11 mg/dL (ref 5–40)

## 2023-10-25 LAB — HEMOGLOBIN A1C
Est. average glucose Bld gHb Est-mCnc: 114 mg/dL
Hgb A1c MFr Bld: 5.6 % (ref 4.8–5.6)

## 2023-10-25 LAB — VITAMIN B12: Vitamin B-12: 1504 pg/mL — ABNORMAL HIGH (ref 232–1245)

## 2023-10-25 LAB — VITAMIN D 25 HYDROXY (VIT D DEFICIENCY, FRACTURES): Vit D, 25-Hydroxy: 23.8 ng/mL — ABNORMAL LOW (ref 30.0–100.0)

## 2023-10-25 LAB — INSULIN, RANDOM: INSULIN: 10 u[IU]/mL (ref 2.6–24.9)

## 2023-10-25 LAB — TSH+T4F+T3FREE
Free T4: 1.43 ng/dL (ref 0.82–1.77)
T3, Free: 1.8 pg/mL — ABNORMAL LOW (ref 2.0–4.4)
TSH: 0.686 u[IU]/mL (ref 0.450–4.500)

## 2023-10-25 LAB — VITAMIN B1: Thiamine: 81.9 nmol/L (ref 66.5–200.0)

## 2023-10-25 NOTE — Progress Notes (Signed)
 Subjective:   Patient ID: Victoria [name], female   DOB: 63 y.o.   MRN: [id number]   HPI Patient presents stating her heels are still really bothering her more left than right and states that she has tried to wear the braces it is worse at nighttime when she lays down   ROS      Objective:  Physical Exam  Neurovascular status intact with significant discomfort in the proximal portion of the fascia left and into the fascia with inability to lay down with any degree of comfort     Assessment:  Appears to be an acute fascial inflammation and I cannot rule out that there may not also be pathology associated with neuropathy with patient stating she is taking the 1000 mg of gabapentin  at night     Plan:  H&P reviewed at this point I dispensed night splint to try to offload weight from the plantar and posterior heel and advised on heat therapy.  Patient will be seen back to recheck all questions were answered today concerning pathology

## 2023-10-25 NOTE — Telephone Encounter (Signed)
 FYI Only or Action Required?: FYI only for provider.  Patient was last seen in primary care on 10/17/2023 by Delores Shields A, DO.  Called Nurse Triage reporting Shortness of Breath.  Symptoms began yesterday.  Interventions attempted: Prescription medications: Symbicort  inhaler.  Symptoms are: gradually worsening.  Triage Disposition: See HCP Within 4 Hours (Or PCP Triage)  Patient/caregiver understands and will follow disposition?: Yes                             Copied from CRM #[id number]. Topic: Clinical - Red Word Triage >> Oct 25, 2023  9:22 AM Larissa RAMAN wrote: [facility] that prompted transfer to Nurse Triage: Increased SOB Reason for Disposition  [1] MILD difficulty breathing (e.g., minimal/no SOB at rest, SOB with walking, pulse < 100) AND [2] NEW-onset or WORSE than normal  Answer Assessment - Initial Assessment Questions 1. RESPIRATORY STATUS: Describe your breathing? (e.g., wheezing, shortness of breath, unable to speak, severe coughing)      Increased SOB upon exertion  2. ONSET: When did this breathing problem begin?      Yesterday  3. PATTERN Does the difficult breathing come and go, or has it been constant since it started?      States SOB is intermittent, states SOB worsens upon exertion, patient able to speak in clear and complete sentences while on phone with this RN 4. SEVERITY: How bad is your breathing? (e.g., mild, moderate, severe)      Mild 6. CARDIAC HISTORY: Do you have any history of heart disease? (e.g., heart attack, angina, bypass surgery, angioplasty)      Denies 7. LUNG HISTORY: Do you have any history of lung disease?  (e.g., pulmonary embolus, asthma, emphysema)     Diagnosed with pneumonia in July, states she is feeling the same symptoms as then  8. CAUSE: What do you think is causing the breathing problem?      Pneumonia  9. OTHER SYMPTOMS: Do you have any other symptoms? (e.g., chest pain, cough, dizziness,  fever, runny nose)     Dry cough, denies fever, denies chest pain, denies wheezing 10. O2 SATURATION MONITOR:  Do you use an oxygen saturation monitor (pulse oximeter) at home? If Yes, ask: What is your reading (oxygen level) today? What is your usual oxygen saturation reading? (e.g., 95%)     91-98%    No availability in office today. Patient going to UC.  Protocols used: Breathing Difficulty-A-AH

## 2023-10-26 ENCOUNTER — Other Ambulatory Visit: Payer: Self-pay | Admitting: Internal Medicine

## 2023-10-26 DIAGNOSIS — E8770 Fluid overload, unspecified: Secondary | ICD-10-CM

## 2023-10-29 ENCOUNTER — Encounter: Payer: Self-pay | Admitting: Bariatrics

## 2023-10-29 DIAGNOSIS — R7989 Other specified abnormal findings of blood chemistry: Secondary | ICD-10-CM | POA: Insufficient documentation

## 2023-10-30 ENCOUNTER — Other Ambulatory Visit: Payer: Self-pay | Admitting: Nurse Practitioner

## 2023-10-30 DIAGNOSIS — E8770 Fluid overload, unspecified: Secondary | ICD-10-CM

## 2023-11-03 ENCOUNTER — Other Ambulatory Visit: Payer: Self-pay | Admitting: Internal Medicine

## 2023-11-03 ENCOUNTER — Encounter: Payer: Self-pay | Admitting: Cardiology

## 2023-11-03 DIAGNOSIS — R0602 Shortness of breath: Secondary | ICD-10-CM

## 2023-11-03 DIAGNOSIS — I272 Pulmonary hypertension, unspecified: Secondary | ICD-10-CM

## 2023-11-04 DIAGNOSIS — Z23 Encounter for immunization: Secondary | ICD-10-CM | POA: Diagnosis not present

## 2023-11-04 DIAGNOSIS — M549 Dorsalgia, unspecified: Secondary | ICD-10-CM | POA: Diagnosis not present

## 2023-11-04 DIAGNOSIS — R0602 Shortness of breath: Secondary | ICD-10-CM | POA: Diagnosis not present

## 2023-11-04 DIAGNOSIS — M1990 Unspecified osteoarthritis, unspecified site: Secondary | ICD-10-CM | POA: Diagnosis not present

## 2023-11-04 DIAGNOSIS — L732 Hidradenitis suppurativa: Secondary | ICD-10-CM | POA: Diagnosis not present

## 2023-11-04 DIAGNOSIS — M2550 Pain in unspecified joint: Secondary | ICD-10-CM | POA: Diagnosis not present

## 2023-11-04 DIAGNOSIS — R768 Other specified abnormal immunological findings in serum: Secondary | ICD-10-CM | POA: Diagnosis not present

## 2023-11-04 DIAGNOSIS — G4733 Obstructive sleep apnea (adult) (pediatric): Secondary | ICD-10-CM | POA: Diagnosis not present

## 2023-11-04 DIAGNOSIS — R5383 Other fatigue: Secondary | ICD-10-CM | POA: Diagnosis not present

## 2023-11-06 ENCOUNTER — Other Ambulatory Visit: Payer: Self-pay | Admitting: Rheumatology

## 2023-11-06 DIAGNOSIS — M461 Sacroiliitis, not elsewhere classified: Secondary | ICD-10-CM

## 2023-11-07 NOTE — H&P (View-Only) (Signed)
 Cardiology Office Note    Patient Name: Victoria [name] Date of Encounter: 11/07/2023  Primary Care Provider:  Perri Ronal PARAS, MD Primary Cardiologist:  Wilbert Bihari, MD Primary Electrophysiologist: Eulas FORBES Furbish, MD   Past Medical History    Past Medical History:  Diagnosis Date   Anxiety    Back pain    Bursitis    right shoulder   Chronic diastolic heart failure (HCC)    Chronic kidney disease    hematuria, has been worked up, her normal   Chronic leukopenia    intermittant since 2010, followed by Dr. Perri now   Chronic neck pain    Constipation    Degenerative joint disease of low back 09/02/2015   Dr. Hiram   Displacement of lumbar intervertebral disc    Dysrhythmia    Tachycardia, PVCs, PACs   Edema of both lower legs    Fibrocystic breast    Gallbladder problem    GERD (gastroesophageal reflux disease)    Headache    per pt more stress/tension   Heart palpitations    SEE EPIC ENCOUTNER , CARDIOLOGY DR. WILBERT TURNER 2018; reports on 05-16-17  i haven't had any bouts of those lately     Hemorrhoids    Hidradenitis suppurativa    History of Clostridium difficile infection 12/2010   History of exercise intolerance    ETT on 04-27-2016-- negative ([facility] treadmill score 7)   History of Helicobacter pylori infection 2001 and 09/ 2012   HSV-2 infection    genital   Hx of adenomatous colonic polyps 10/17/2007   Hydradenitis    per pt currently treated with Humira  injection   Hypertension    Hyperthyroidism    Hypocupremia    Hypothyroidism    Hypothyroidism, postsurgical 1980   IBS (irritable bowel syndrome)    Insomnia    Iron  deficiency    Joint pain    Leukopenia    Lumbosacral radiculopathy    Lymphocytic colitis 05/14/2022   dx from colonoscopy   Medial meniscus tear    right knee   Mild obstructive sleep apnea    Mitral regurgitation    Mild by echo 06/2023   Numbness and tingling of right arm    Obesity    Osteoarthritis     Palpitations    Pernicious anemia    b12 def   Peroneal neuropathy    PONV (postoperative nausea and vomiting)    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 female with a PMH of OSA (on CPAP), obesity, nonsustained tachycardia, lymphocytic colitis, who presents today for workup prior to right heart catheterization.  Ms. Schatz was seen initially in 2015 by Dr. Bihari for evaluation of chest pain. She underwent nuclear stress test which showed low risk study with small anterior perfusion defect most likely artifact.  She was started on Protonix  for treatment of GERD and chest pain resolved.  She was seen in follow-up by Dr. Bihari in 2018 with complaint of shortness of  breath.  When a 2D echo that was normal and had an ETT done that showed no evidence of ischemia.  She was seen in follow-up in 04/2020 coronary CTA that showed no evidence of CAD calcium  score 0.  No palpitations and wore an event monitor that showed PVCs, PACs and nonsustained atrial tach with NSVT and was started on Toprol -XL and flecainide  50 mg twice daily.  She was last seen by Dr. Shlomo on 06/11/2023 plan of palpitations.  She wore a 2-week event monitor that showed 2 episodes of SVT and Toprol  was increased to 37.5 mg and patient was referred to EP for further workup.  She was seen by Dr. Nancey and had flecainide  discontinued with plans to restart if symptoms return.  She was seen by Scot Ford, PA on 10/02/2023 evaluation of shortness of breath on exertion.  She was ambulated in the office and became short of breath after 30 yards with O2's maintaining at  94%.  A high-resolution CT of the chest that showed evidence of pulmonary hypertension.  She was advised to have a right heart cath and referral to the advanced heart failure clinic following procedure.  Ms. Robotham presents today to discuss upcoming right heart cath procedure. She continues to experience shortness of breath, particularly during activities such as walking or showering, necessitating frequent breaks to catch her breath. She uses Symbicort  twice daily and an albuterol  rescue inhaler, though these do not significantly alleviate her symptoms. A recent urgent care visit on August 29th for shortness of breath resulted in a diagnosis of bronchitis, for which she completed a 10-day course of steroids and doxycycline . She has a history of respiratory infections, including pneumonia on January 8th and bronchitis on August 29th, both requiring urgent care visits. She is concerned about the frequency of these respiratory issues occurring just over a month apart. She experiences a dry cough and takes deep breaths to manage her shortness of breath while walking. She does not use an incentive spirometer. She reports episodes of palpitations and a pounding heart sensation, particularly when lying down. She experienced chest discomfort last Saturday, described as a dull pain on the right side, lasting about three minutes, with no radiation to the arm or neck. She uses a CPAP machine but does not sleep well, averaging about five and a half hours of sleep per night. She feels tired and drained. She is currently on Lasix  and notes that she experiences swelling if she does not take it. Patient denies chest pain, palpitations, dyspnea, PND, orthopnea, nausea, vomiting, dizziness, syncope, edema, weight gain, or early satiety.  Discussed the use of AI scribe software for clinical note transcription with the patient, who gave verbal consent to proceed.  History of Present Illness   Review of Systems  Please see the  history of present illness.    All other systems reviewed and are otherwise negative except as noted above.  Physical Exam    Wt Readings from Last 3 Encounters:  10/25/23 (!) 308 lb (139.7 kg)  10/17/23 (!) 308 lb (139.7 kg)  10/02/23 (!) 311 lb (141.1 kg)   CD:Uyzmz were no vitals filed for this visit.,There is no height or weight on file to calculate BMI. GEN: Well nourished, well developed in no acute distress Neck: No JVD; No carotid bruits Pulmonary: Clear to auscultation diminished in bases bilaterally Cardiovascular: Normal rate. Regular rhythm. Normal S1. Normal S2.   Murmurs: There is no murmur.  ABDOMEN: Soft, non-tender, non-distended EXTREMITIES:  No edema;  No deformity   EKG/LABS/ Recent Cardiac Studies   ECG personally reviewed by me today -sinus rhythm with rate of 60 bpm with possible RA enlargement and no significant change from previous EKG.  Risk Assessment/Calculations:          Lab Results  Component Value Date   WBC 7.7 09/05/2023   HGB 12.5 09/05/2023   HCT 39.3 09/05/2023   MCV 99.7 09/05/2023   PLT 226 09/05/2023   Lab Results  Component Value Date   CREATININE 1.17 (H) 10/17/2023   BUN 18 10/17/2023   NA 140 10/17/2023   K 4.3 10/17/2023   CL 103 10/17/2023   CO2 23 10/17/2023   Lab Results  Component Value Date   CHOL 224 (H) 10/17/2023   HDL 92 10/17/2023   LDLCALC 121 (H) 10/17/2023   TRIG 64 10/17/2023   CHOLHDL 2 07/04/2023    Lab Results  Component Value Date   HGBA1C 5.6 10/17/2023   Assessment & Plan    Assessment & Plan  1.  Pulmonary HTN: - Schedule right heart catheterization to assess pressures. - Discuss procedure risks: bleeding, pneumothorax, arrhythmia, death. - Refer to Advanced Heart Failure Clinic per Dr. Shlomo - Order CBC and BMP.  2.  Paroxysmal atrial tachycardia: -Intermittent palpitations possibly exacerbated by steroids. Previous tests showed no coronary artery disease. - Continue Toprol , consider  dosage adjustment if symptoms persist.  3.  History of OSA: -Managed with CPAP, reports suboptimal sleep quality. - Continue CPAP as directed.  4.  Shortness of breath: Chronic dyspnea possibly related to suspected pulmonary hypertension. Limited relief from Symbicort  and albuterol .     Disposition: Follow-up with Wilbert Shlomo, MD or APP as scheduled Informed Consent   Shared Decision Making/Informed Consent The risks, including but not limited to, [bleeding or vascular complications (1 in 500), pneumothorax (1 in 1600), arrhythmia (1 in 1000) and death (1 in 5000)], benefits (diagnostic support and/or management of heart failure, pulmonary hypertension) and alternatives of a right heart catheterization were discussed in detail with Ms. Dicesare and she is willing to proceed.      Signed, Wyn Raddle, Jackee Shove, NP 11/07/2023, 5:28 PM [facility]

## 2023-11-07 NOTE — Progress Notes (Signed)
 Cardiology Office Note    Patient Name: Victoria [name] Date of Encounter: 11/07/2023  Primary Care Provider:  Perri Ronal PARAS, MD Primary Cardiologist:  Wilbert Bihari, MD Primary Electrophysiologist: Eulas FORBES Furbish, MD   Past Medical History    Past Medical History:  Diagnosis Date   Anxiety    Back pain    Bursitis    right shoulder   Chronic diastolic heart failure (HCC)    Chronic kidney disease    hematuria, has been worked up, her normal   Chronic leukopenia    intermittant since 2010, followed by Dr. Perri now   Chronic neck pain    Constipation    Degenerative joint disease of low back 09/02/2015   Dr. Hiram   Displacement of lumbar intervertebral disc    Dysrhythmia    Tachycardia, PVCs, PACs   Edema of both lower legs    Fibrocystic breast    Gallbladder problem    GERD (gastroesophageal reflux disease)    Headache    per pt more stress/tension   Heart palpitations    SEE EPIC ENCOUTNER , CARDIOLOGY DR. WILBERT TURNER 2018; reports on 05-16-17  i haven't had any bouts of those lately     Hemorrhoids    Hidradenitis suppurativa    History of Clostridium difficile infection 12/2010   History of exercise intolerance    ETT on 04-27-2016-- negative ([facility] treadmill score 7)   History of Helicobacter pylori infection 2001 and 09/ 2012   HSV-2 infection    genital   Hx of adenomatous colonic polyps 10/17/2007   Hydradenitis    per pt currently treated with Humira  injection   Hypertension    Hyperthyroidism    Hypocupremia    Hypothyroidism    Hypothyroidism, postsurgical 1980   IBS (irritable bowel syndrome)    Insomnia    Iron  deficiency    Joint pain    Leukopenia    Lumbosacral radiculopathy    Lymphocytic colitis 05/14/2022   dx from colonoscopy   Medial meniscus tear    right knee   Mild obstructive sleep apnea    Mitral regurgitation    Mild by echo 06/2023   Numbness and tingling of right arm    Obesity    Osteoarthritis     Palpitations    Pernicious anemia    b12 def   Peroneal neuropathy    PONV (postoperative nausea and vomiting)    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 female with a PMH of OSA (on CPAP), obesity, nonsustained tachycardia, lymphocytic colitis, who presents today for workup prior to right heart catheterization.  Ms. Schatz was seen initially in 2015 by Dr. Bihari for evaluation of chest pain. She underwent nuclear stress test which showed low risk study with small anterior perfusion defect most likely artifact.  She was started on Protonix  for treatment of GERD and chest pain resolved.  She was seen in follow-up by Dr. Bihari in 2018 with complaint of shortness of  breath.  When a 2D echo that was normal and had an ETT done that showed no evidence of ischemia.  She was seen in follow-up in 04/2020 coronary CTA that showed no evidence of CAD calcium  score 0.  No palpitations and wore an event monitor that showed PVCs, PACs and nonsustained atrial tach with NSVT and was started on Toprol -XL and flecainide  50 mg twice daily.  She was last seen by Dr. Shlomo on 06/11/2023 plan of palpitations.  She wore a 2-week event monitor that showed 2 episodes of SVT and Toprol  was increased to 37.5 mg and patient was referred to EP for further workup.  She was seen by Dr. Nancey and had flecainide  discontinued with plans to restart if symptoms return.  She was seen by Scot Ford, PA on 10/02/2023 evaluation of shortness of breath on exertion.  She was ambulated in the office and became short of breath after 30 yards with O2's maintaining at  94%.  A high-resolution CT of the chest that showed evidence of pulmonary hypertension.  She was advised to have a right heart cath and referral to the advanced heart failure clinic following procedure.  Ms. Robotham presents today to discuss upcoming right heart cath procedure. She continues to experience shortness of breath, particularly during activities such as walking or showering, necessitating frequent breaks to catch her breath. She uses Symbicort  twice daily and an albuterol  rescue inhaler, though these do not significantly alleviate her symptoms. A recent urgent care visit on August 29th for shortness of breath resulted in a diagnosis of bronchitis, for which she completed a 10-day course of steroids and doxycycline . She has a history of respiratory infections, including pneumonia on January 8th and bronchitis on August 29th, both requiring urgent care visits. She is concerned about the frequency of these respiratory issues occurring just over a month apart. She experiences a dry cough and takes deep breaths to manage her shortness of breath while walking. She does not use an incentive spirometer. She reports episodes of palpitations and a pounding heart sensation, particularly when lying down. She experienced chest discomfort last Saturday, described as a dull pain on the right side, lasting about three minutes, with no radiation to the arm or neck. She uses a CPAP machine but does not sleep well, averaging about five and a half hours of sleep per night. She feels tired and drained. She is currently on Lasix  and notes that she experiences swelling if she does not take it. Patient denies chest pain, palpitations, dyspnea, PND, orthopnea, nausea, vomiting, dizziness, syncope, edema, weight gain, or early satiety.  Discussed the use of AI scribe software for clinical note transcription with the patient, who gave verbal consent to proceed.  History of Present Illness   Review of Systems  Please see the  history of present illness.    All other systems reviewed and are otherwise negative except as noted above.  Physical Exam    Wt Readings from Last 3 Encounters:  10/25/23 (!) 308 lb (139.7 kg)  10/17/23 (!) 308 lb (139.7 kg)  10/02/23 (!) 311 lb (141.1 kg)   CD:Uyzmz were no vitals filed for this visit.,There is no height or weight on file to calculate BMI. GEN: Well nourished, well developed in no acute distress Neck: No JVD; No carotid bruits Pulmonary: Clear to auscultation diminished in bases bilaterally Cardiovascular: Normal rate. Regular rhythm. Normal S1. Normal S2.   Murmurs: There is no murmur.  ABDOMEN: Soft, non-tender, non-distended EXTREMITIES:  No edema;  No deformity   EKG/LABS/ Recent Cardiac Studies   ECG personally reviewed by me today -sinus rhythm with rate of 60 bpm with possible RA enlargement and no significant change from previous EKG.  Risk Assessment/Calculations:          Lab Results  Component Value Date   WBC 7.7 09/05/2023   HGB 12.5 09/05/2023   HCT 39.3 09/05/2023   MCV 99.7 09/05/2023   PLT 226 09/05/2023   Lab Results  Component Value Date   CREATININE 1.17 (H) 10/17/2023   BUN 18 10/17/2023   NA 140 10/17/2023   K 4.3 10/17/2023   CL 103 10/17/2023   CO2 23 10/17/2023   Lab Results  Component Value Date   CHOL 224 (H) 10/17/2023   HDL 92 10/17/2023   LDLCALC 121 (H) 10/17/2023   TRIG 64 10/17/2023   CHOLHDL 2 07/04/2023    Lab Results  Component Value Date   HGBA1C 5.6 10/17/2023   Assessment & Plan    Assessment & Plan  1.  Pulmonary HTN: - Schedule right heart catheterization to assess pressures. - Discuss procedure risks: bleeding, pneumothorax, arrhythmia, death. - Refer to Advanced Heart Failure Clinic per Dr. Shlomo - Order CBC and BMP.  2.  Paroxysmal atrial tachycardia: -Intermittent palpitations possibly exacerbated by steroids. Previous tests showed no coronary artery disease. - Continue Toprol , consider  dosage adjustment if symptoms persist.  3.  History of OSA: -Managed with CPAP, reports suboptimal sleep quality. - Continue CPAP as directed.  4.  Shortness of breath: Chronic dyspnea possibly related to suspected pulmonary hypertension. Limited relief from Symbicort  and albuterol .     Disposition: Follow-up with Wilbert Shlomo, MD or APP as scheduled Informed Consent   Shared Decision Making/Informed Consent The risks, including but not limited to, [bleeding or vascular complications (1 in 500), pneumothorax (1 in 1600), arrhythmia (1 in 1000) and death (1 in 5000)], benefits (diagnostic support and/or management of heart failure, pulmonary hypertension) and alternatives of a right heart catheterization were discussed in detail with Ms. Dicesare and she is willing to proceed.      Signed, Wyn Raddle, Jackee Shove, NP 11/07/2023, 5:28 PM [facility]

## 2023-11-08 ENCOUNTER — Ambulatory Visit: Attending: Nurse Practitioner | Admitting: Nurse Practitioner

## 2023-11-08 ENCOUNTER — Encounter: Payer: Self-pay | Admitting: Nurse Practitioner

## 2023-11-08 VITALS — BP 100/68 | HR 71 | Ht 66.5 in | Wt 309.0 lb

## 2023-11-08 DIAGNOSIS — I272 Pulmonary hypertension, unspecified: Secondary | ICD-10-CM | POA: Diagnosis not present

## 2023-11-08 DIAGNOSIS — G4733 Obstructive sleep apnea (adult) (pediatric): Secondary | ICD-10-CM | POA: Diagnosis not present

## 2023-11-08 DIAGNOSIS — R0602 Shortness of breath: Secondary | ICD-10-CM | POA: Diagnosis not present

## 2023-11-08 DIAGNOSIS — I4719 Other supraventricular tachycardia: Secondary | ICD-10-CM

## 2023-11-08 NOTE — Patient Instructions (Signed)
 Medication Instructions:  Your physician recommends that you continue on your current medications as directed. Please refer to the Current Medication list given to you today. *If you need a refill on your cardiac medications before your next appointment, please call your pharmacy*  Lab Work: TODAY-BMET & CBC If you have labs (blood work) drawn today and your tests are completely normal, you will receive your results only by: MyChart Message (if you have MyChart) OR A paper copy in the mail If you have any lab test that is abnormal or we need to change your treatment, we will call you to review the results.  Testing/Procedures: Your physician has requested that you have a cardiac catheterization. Cardiac catheterization is used to diagnose and/or treat various heart conditions. Doctors may recommend this procedure for a number of different reasons. The most common reason is to evaluate chest pain. Chest pain can be a symptom of coronary artery disease (CAD), and cardiac catheterization can show whether plaque is narrowing or blocking your heart's arteries. This procedure is also used to evaluate the valves, as well as measure the blood flow and oxygen levels in different parts of your heart. For further information please visit [email or web address] Please follow instruction sheet, as given.  Follow-Up: At [facility], you and your health needs are our priority.  As part of our continuing mission to provide you with exceptional heart care, our providers are all part of one team.  This team includes your primary Cardiologist (physician) and Advanced Practice Providers or APPs (Physician Assistants and Nurse Practitioners) who all work together to provide you with the care you need, when you need it.  Your next appointment:   FOLLOW UP AS SCHEDULED    Provider:   Wilbert Bihari, MD    We recommend signing up for the patient portal called MyChart.  Sign up information is provided on this  After Visit Summary.  MyChart is used to connect with patients for Virtual Visits (Telemedicine).  Patients are able to view lab/test results, encounter notes, upcoming appointments, etc.  Non-urgent messages can be sent to your provider as well.   To learn more about what you can do with MyChart, go to [email or web address].   Other Instructions           Cardiac Catheterization   You are scheduled for a Cardiac Catheterization on Friday, September 19 with Dr. Peter [place].  1. Please arrive at the [facility] (Main Entrance A) at [facility]: [address] [place], [place] [address] at 7:00 AM (This time is 2 hour(s) before your procedure to ensure your preparation).   Free valet parking service is available. You will check in at ADMITTING. The support person will be asked to wait in the waiting room.  It is OK to have someone drop you off and come back when you are ready to be discharged.        Special note: Every effort is made to have your procedure done on time. Please understand that emergencies sometimes delay scheduled procedures.  2. Diet: Nothing to eat after midnight.  3. Hydration:You need to be well hydrated before your procedure. On September 19, you may drink approved liquids (see below) until 2 hours before the procedure, with 16 oz of water  as your last intake.   List of approved liquids water , clear juice, clear tea, black coffee, fruit juices, non-citric and without pulp, carbonated beverages, Gatorade, Kool -Aid, plain Jello-O and plain ice popsicles.  4. Labs: You  will need to have blood drawn on Friday, September 12 at [facility] (1st Floor), [address], [place], [place] [address]. You do not need to be fasting.  5. Medication instructions in preparation for your procedure:   Contrast Allergy : No  On the morning of your procedure, take Aspirin  81 mg and any morning medicines NOT listed above.   You may use sips of water .  6. Plan to go home the same day, you will only stay overnight if medically necessary. 7. You MUST have a responsible adult to drive you home. 8. An adult MUST be with you the first 24 hours after you arrive home. 9. Bring a current list of your medications, and the last time and date medication taken. 10. Bring ID and current insurance cards. 11.Please wear clothes that are easy to get on and off and wear slip-on shoes.  Thank you for allowing us  to care for you!   -- [facility]

## 2023-11-09 ENCOUNTER — Ambulatory Visit: Payer: Self-pay | Admitting: Nurse Practitioner

## 2023-11-09 LAB — BASIC METABOLIC PANEL WITH GFR
BUN/Creatinine Ratio: 19 (ref 12–28)
BUN: 24 mg/dL (ref 8–27)
CO2: 19 mmol/L — AB (ref 20–29)
Calcium: 8.6 mg/dL — AB (ref 8.7–10.3)
Chloride: 109 mmol/L — AB (ref 96–106)
Creatinine, Ser: 1.25 mg/dL — AB (ref 0.57–1.00)
Glucose: 92 mg/dL (ref 70–99)
Potassium: 4 mmol/L (ref 3.5–5.2)
Sodium: 145 mmol/L — AB (ref 134–144)
eGFR: 49 mL/min/1.73 — AB (ref >59)

## 2023-11-09 LAB — CBC
Hematocrit: 38.6 % (ref 34.0–46.6)
Hemoglobin: 12.5 g/dL (ref 11.1–15.9)
MCH: 33.4 pg — ABNORMAL HIGH (ref 26.6–33.0)
MCHC: 32.4 g/dL (ref 31.5–35.7)
MCV: 103 fL — ABNORMAL HIGH (ref 79–97)
Platelets: 213 x10E3/uL (ref 150–450)
RBC: 3.74 x10E6/uL — ABNORMAL LOW (ref 3.77–5.28)
RDW: 13.2 % (ref 11.7–15.4)
WBC: 8.5 x10E3/uL (ref 3.4–10.8)

## 2023-11-11 ENCOUNTER — Other Ambulatory Visit: Payer: Self-pay | Admitting: Family

## 2023-11-11 DIAGNOSIS — K909 Intestinal malabsorption, unspecified: Secondary | ICD-10-CM

## 2023-11-11 DIAGNOSIS — D508 Other iron deficiency anemias: Secondary | ICD-10-CM

## 2023-11-12 ENCOUNTER — Ambulatory Visit
Admission: RE | Admit: 2023-11-12 | Discharge: 2023-11-12 | Disposition: A | Discharge status: Home / Self Care | Source: Ambulatory Visit | Attending: Rheumatology | Admitting: Rheumatology

## 2023-11-12 ENCOUNTER — Inpatient Hospital Stay: Attending: Family

## 2023-11-12 DIAGNOSIS — D508 Other iron deficiency anemias: Secondary | ICD-10-CM

## 2023-11-12 DIAGNOSIS — D509 Iron deficiency anemia, unspecified: Secondary | ICD-10-CM | POA: Diagnosis present

## 2023-11-12 DIAGNOSIS — M461 Sacroiliitis, not elsewhere classified: Secondary | ICD-10-CM

## 2023-11-12 DIAGNOSIS — M545 Low back pain, unspecified: Secondary | ICD-10-CM | POA: Diagnosis not present

## 2023-11-12 DIAGNOSIS — K909 Intestinal malabsorption, unspecified: Secondary | ICD-10-CM

## 2023-11-12 LAB — IRON AND IRON BINDING CAPACITY (CC-WL,HP ONLY)
Iron: 113 ug/dL (ref 28–170)
Saturation Ratios: 34 % — ABNORMAL HIGH (ref 10.4–31.8)
TIBC: 336 ug/dL (ref 250–450)
UIBC: 223 ug/dL

## 2023-11-12 LAB — CBC WITH DIFFERENTIAL (CANCER CENTER ONLY)
Abs Immature Granulocytes: 0.01 K/uL (ref 0.00–0.07)
Basophils Absolute: 0 K/uL (ref 0.0–0.1)
Basophils Relative: 0 %
Eosinophils Absolute: 0 K/uL (ref 0.0–0.5)
Eosinophils Relative: 1 %
HCT: 39.2 % (ref 36.0–46.0)
Hemoglobin: 13 g/dL (ref 12.0–15.0)
Immature Granulocytes: 0 %
Lymphocytes Relative: 31 %
Lymphs Abs: 1.7 K/uL (ref 0.7–4.0)
MCH: 33.1 pg (ref 26.0–34.0)
MCHC: 33.2 g/dL (ref 30.0–36.0)
MCV: 99.7 fL (ref 80.0–100.0)
Monocytes Absolute: 0.4 K/uL (ref 0.1–1.0)
Monocytes Relative: 7 %
Neutro Abs: 3.3 K/uL (ref 1.7–7.7)
Neutrophils Relative %: 61 %
Platelet Count: 178 K/uL (ref 150–400)
RBC: 3.93 MIL/uL (ref 3.87–5.11)
RDW: 14.6 % (ref 11.5–15.5)
WBC Count: 5.5 K/uL (ref 4.0–10.5)
nRBC: 0 % (ref 0.0–0.2)

## 2023-11-12 LAB — FERRITIN: Ferritin: 172 ng/mL (ref 11–307)

## 2023-11-14 ENCOUNTER — Telehealth: Payer: Self-pay | Admitting: *Deleted

## 2023-11-14 NOTE — Telephone Encounter (Signed)
 Call to patient to review instructions, no answer, voicemail message.

## 2023-11-14 NOTE — Telephone Encounter (Signed)
 Patient is returning your call.

## 2023-11-14 NOTE — Telephone Encounter (Signed)
 Right Heart Cath scheduled at [facility] for: Friday November 15, 2023 10:30 AM Arrival time [facility] Main Entrance A at: 8:30 AM-(time change per cath lab schedule)   Diet: -Nothing to eat after midnight.  Hydration: -May drink clear liquids until 2 hours before the procedure.  Approved liquids: Water , clear tea, black coffee, fruit juices-non-citric and without pulp,Gatorade, plain Jello/popsicles.  Medication instructions: -Hold:  Lasix /KCl-AM of procedure -Other usual morning medications can be taken.  Plan to go home the same day, you will only stay overnight if medically necessary.  You must have responsible adult to drive you home.  Someone must be with you the first 24 hours after you arrive home.  Left message for patient to call back

## 2023-11-14 NOTE — Telephone Encounter (Signed)
 Reviewed procedure instructions with patient.

## 2023-11-15 ENCOUNTER — Other Ambulatory Visit: Payer: Self-pay

## 2023-11-15 ENCOUNTER — Ambulatory Visit (HOSPITAL_COMMUNITY)
Admission: RE | Admit: 2023-11-15 | Discharge: 2023-11-15 | Disposition: A | Discharge status: Home / Self Care | Attending: Cardiology | Admitting: Cardiology

## 2023-11-15 ENCOUNTER — Encounter (HOSPITAL_COMMUNITY)
Admission: RE | Disposition: A | Discharge status: Home / Self Care | Payer: Self-pay | Source: Home / Self Care | Attending: Cardiology

## 2023-11-15 DIAGNOSIS — I272 Pulmonary hypertension, unspecified: Secondary | ICD-10-CM | POA: Insufficient documentation

## 2023-11-15 DIAGNOSIS — I4719 Other supraventricular tachycardia: Secondary | ICD-10-CM | POA: Insufficient documentation

## 2023-11-15 DIAGNOSIS — R0602 Shortness of breath: Secondary | ICD-10-CM | POA: Insufficient documentation

## 2023-11-15 DIAGNOSIS — R0609 Other forms of dyspnea: Secondary | ICD-10-CM | POA: Diagnosis not present

## 2023-11-15 DIAGNOSIS — Z79899 Other long term (current) drug therapy: Secondary | ICD-10-CM | POA: Diagnosis not present

## 2023-11-15 DIAGNOSIS — R7302 Impaired glucose tolerance (oral): Secondary | ICD-10-CM

## 2023-11-15 DIAGNOSIS — G4733 Obstructive sleep apnea (adult) (pediatric): Secondary | ICD-10-CM | POA: Diagnosis not present

## 2023-11-15 DIAGNOSIS — I5032 Chronic diastolic (congestive) heart failure: Secondary | ICD-10-CM | POA: Diagnosis not present

## 2023-11-15 DIAGNOSIS — E66813 Body mass index (BMI) 45.0-49.9, adult: Secondary | ICD-10-CM

## 2023-11-15 DIAGNOSIS — I130 Hypertensive heart and chronic kidney disease with heart failure and stage 1 through stage 4 chronic kidney disease, or unspecified chronic kidney disease: Secondary | ICD-10-CM | POA: Diagnosis not present

## 2023-11-15 HISTORY — PX: RIGHT HEART CATH: CATH118263

## 2023-11-15 LAB — POCT I-STAT EG7
Acid-base deficit: 1 mmol/L (ref 0.0–2.0)
Acid-base deficit: 1 mmol/L (ref 0.0–2.0)
Bicarbonate: 23.9 mmol/L (ref 20.0–28.0)
Bicarbonate: 24 mmol/L (ref 20.0–28.0)
Calcium, Ion: 1.16 mmol/L (ref 1.15–1.40)
Calcium, Ion: 1.17 mmol/L (ref 1.15–1.40)
HCT: 32 % — ABNORMAL LOW (ref 36.0–46.0)
HCT: 34 % — ABNORMAL LOW (ref 36.0–46.0)
Hemoglobin: 10.9 g/dL — ABNORMAL LOW (ref 12.0–15.0)
Hemoglobin: 11.6 g/dL — ABNORMAL LOW (ref 12.0–15.0)
O2 Saturation: 75 %
O2 Saturation: 76 %
Potassium: 3.9 mmol/L (ref 3.5–5.1)
Potassium: 3.9 mmol/L (ref 3.5–5.1)
Sodium: 140 mmol/L (ref 135–145)
Sodium: 140 mmol/L (ref 135–145)
TCO2: 25 mmol/L (ref 22–32)
TCO2: 25 mmol/L (ref 22–32)
pCO2, Ven: 39.8 mmHg — ABNORMAL LOW (ref 44–60)
pCO2, Ven: 40 mmHg — ABNORMAL LOW (ref 44–60)
pH, Ven: 7.386 (ref 7.25–7.43)
pH, Ven: 7.387 (ref 7.25–7.43)
pO2, Ven: 41 mmHg (ref 32–45)
pO2, Ven: 41 mmHg (ref 32–45)

## 2023-11-15 SURGERY — RIGHT HEART CATH
Anesthesia: LOCAL

## 2023-11-15 MED ORDER — ACETAMINOPHEN 325 MG PO TABS: 650.0000 mg | ORAL_TABLET | ORAL | Status: DC | PRN

## 2023-11-15 MED ORDER — ONDANSETRON HCL 4 MG/2ML IJ SOLN: 4.0000 mg | Freq: Four times a day (QID) | INTRAMUSCULAR | Status: DC | PRN

## 2023-11-15 MED ORDER — SODIUM CHLORIDE 0.9 % IV SOLN: 250.0000 mL | INTRAVENOUS | Status: DC | PRN

## 2023-11-15 MED ORDER — SODIUM CHLORIDE 0.9% FLUSH: 3.0000 mL | Freq: Two times a day (BID) | INTRAVENOUS | Status: DC

## 2023-11-15 MED ORDER — FREE WATER: 500.0000 mL | Freq: Once | Status: DC

## 2023-11-15 MED ORDER — ASPIRIN 81 MG PO CHEW: 81.0000 mg | CHEWABLE_TABLET | ORAL | Status: DC

## 2023-11-15 MED ORDER — SODIUM CHLORIDE 0.9% FLUSH: 3.0000 mL | INTRAVENOUS | Status: DC | PRN

## 2023-11-15 MED ORDER — LIDOCAINE HCL (PF) 1 % IJ SOLN
INTRAMUSCULAR | Status: DC | PRN
  Administered 2023-11-15: 2 mL

## 2023-11-15 MED ORDER — LIDOCAINE HCL (PF) 1 % IJ SOLN
INTRAMUSCULAR | Status: AC
  Filled 2023-11-15: qty 30

## 2023-11-15 SURGICAL SUPPLY — 6 items
CATH BALLN WEDGE 5F 110CM (CATHETERS) IMPLANT
PACK CARDIAC CATHETERIZATION (CUSTOM PROCEDURE TRAY) IMPLANT
SHEATH GLIDE SLENDER 4/5FR (SHEATH) IMPLANT
TRANSDUCER W/STOPCOCK (MISCELLANEOUS) IMPLANT
TUBING ART PRESS 72 MALE/FEM (TUBING) IMPLANT
WIRE MICROINTRODUCER 60CM (WIRE) IMPLANT

## 2023-11-15 NOTE — Discharge Instructions (Signed)

## 2023-11-15 NOTE — Progress Notes (Signed)
 Patient and family given discharge packet and educated, no further questions at this time.

## 2023-11-15 NOTE — Interval H&P Note (Signed)
 History and Physical Interval Note:  11/15/2023 10:11 AM  Victoria [name]  has presented today for surgery, with the diagnosis of pulmonary hypertension.  The various methods of treatment have been discussed with the patient and family. After consideration of risks, benefits and other options for treatment, the patient has consented to  Procedure(s): RIGHT HEART CATH (N/A) as a surgical intervention.  The patient's history has been reviewed, patient examined, no change in status, stable for surgery.  I have reviewed the patient's chart and labs.  Questions were answered to the patient's satisfaction.     [facility] 11/15/2023 10:11 AM

## 2023-11-15 NOTE — Progress Notes (Signed)
 Referral was placed. Patient was informed.

## 2023-11-17 ENCOUNTER — Encounter (HOSPITAL_COMMUNITY): Payer: Self-pay | Admitting: Cardiology

## 2023-11-18 ENCOUNTER — Encounter: Payer: Self-pay | Admitting: Family

## 2023-11-19 ENCOUNTER — Encounter: Payer: Self-pay | Admitting: Bariatrics

## 2023-11-19 ENCOUNTER — Ambulatory Visit (INDEPENDENT_AMBULATORY_CARE_PROVIDER_SITE_OTHER): Admitting: Bariatrics

## 2023-11-19 ENCOUNTER — Encounter: Payer: Self-pay | Admitting: Cardiology

## 2023-11-19 ENCOUNTER — Ambulatory Visit: Admitting: Bariatrics

## 2023-11-19 VITALS — BP 127/84 | HR 73 | Temp 97.7°F | Ht 66.5 in | Wt 302.0 lb

## 2023-11-19 DIAGNOSIS — E7800 Pure hypercholesterolemia, unspecified: Secondary | ICD-10-CM | POA: Diagnosis not present

## 2023-11-19 DIAGNOSIS — R632 Polyphagia: Secondary | ICD-10-CM | POA: Diagnosis not present

## 2023-11-19 DIAGNOSIS — E559 Vitamin D deficiency, unspecified: Secondary | ICD-10-CM | POA: Diagnosis not present

## 2023-11-19 DIAGNOSIS — E66813 Obesity, class 3: Secondary | ICD-10-CM | POA: Diagnosis not present

## 2023-11-19 DIAGNOSIS — E8770 Fluid overload, unspecified: Secondary | ICD-10-CM

## 2023-11-19 DIAGNOSIS — R7303 Prediabetes: Secondary | ICD-10-CM

## 2023-11-19 DIAGNOSIS — N1831 Chronic kidney disease, stage 3a: Secondary | ICD-10-CM | POA: Diagnosis not present

## 2023-11-19 DIAGNOSIS — I272 Pulmonary hypertension, unspecified: Secondary | ICD-10-CM

## 2023-11-19 DIAGNOSIS — Z79899 Other long term (current) drug therapy: Secondary | ICD-10-CM

## 2023-11-19 DIAGNOSIS — Z6841 Body Mass Index (BMI) 40.0 and over, adult: Secondary | ICD-10-CM

## 2023-11-19 MED ORDER — WEGOVY 0.25 MG/0.5ML ~~LOC~~ SOAJ: 0.2500 mg | SUBCUTANEOUS | 0 refills | Status: DC

## 2023-11-19 NOTE — Progress Notes (Signed)
 First follow-up after initial visit.        WEIGHT SUMMARY AND BIOMETRICS  Weight Lost Since Last Visit: 6lb  Weight Gained Since Last Visit: 0   Vitals Temp: 97.7 F (36.5 C) BP: 127/84 Pulse Rate: 73 SpO2: 97 %   Anthropometric Measurements Height: 5' 6.5 (1.689 m) Weight: (!) 302 lb (137 kg) BMI (Calculated): 48.02 Weight at Last Visit: 308lb Weight Lost Since Last Visit: 6lb Weight Gained Since Last Visit: 0 Starting Weight: 308lb Total Weight Loss (lbs): 6 lb (2.722 kg)   Body Composition  Body Fat %: 57.6 % Fat Mass (lbs): 174 lbs Muscle Mass (lbs): 121.8 lbs Visceral Fat Rating : 22   Other Clinical Data Fasting: no Labs: no Today's Visit #: 2 Starting Date: 10/17/23    OBESITY Victoria [name] is here to discuss her progress with her obesity treatment plan along with follow-up of her obesity related diagnoses.    Nutrition Plan: the Category 3 plan - 0% adherence.  Current exercise: none  Interim History:  She is down another 6 lbs since her initial visit. She will get back to exercise. She has a treadmill.  Protein intake is as prescribed, Is not skipping meals, and Water  intake is adequate.  Initial positives regarding the dietary plan: The plan was relatively easy to follow.  Initial challenges regarding  the dietary plan: She states that she has been sick and on steroids and is not sure how she was able to lose weight.  She states she has been trying to get more protein.  Pharmacotherapy: Victoria [name] is not on any GLP-1's  She has tried Phentermine only in the past, but had side effects and stopped.  Hunger is moderately controlled.  Cravings are moderately controlled.  Assessment/Plan:   Vitamin D  Insuffiency:  Vitamin D  is not at goal of 50.  Most recent vitamin D  level was 23.8. She is on  prescription ergocalciferol  50,000 IU  weekly. Lab Results  Component Value Date   VD25OH 23.8 (L) 10/17/2023   VD25OH 51 07/09/2023   VD25OH 43 03/18/2023    Plan: Continue prescription vitamin D  50,000 IU weekly.   Elevated Cholesterol:  LDL is not at goal. Medication(s): none Cardiovascular risk factors: obesity (BMI >= 30 kg/m2) and sedentary lifestyle  Lab Results  Component Value Date   CHOL 224 (H) 10/17/2023   HDL 92 10/17/2023   LDLCALC 121 (H) 10/17/2023   TRIG 64 10/17/2023   CHOLHDL 2 07/04/2023   Lab Results  Component Value Date   ALT 16 10/17/2023   AST 17 10/17/2023   ALKPHOS 73 10/17/2023   BILITOT 0.4 10/17/2023   The 10-year ASCVD risk score (Arnett DK, et al., 2019) is: 5.3%   Values used to calculate the score:     Age: 63 years     Clincally relevant sex: Female     Is Non-Hispanic African American: Yes     Diabetic: No     Tobacco smoker: No     Systolic Blood Pressure: 127 mmHg     Is BP treated: No     HDL Cholesterol: 92 mg/dL     Total Cholesterol: 224 mg/dL  Plan:  Will avoid all trans fats.  Will read labels Will minimize saturated fats except the following: low fat meats in moderation, diary, and limited dark chocolate.  Increase Omega 3 in foods, and consider an Omega 3 supplement.   Prediabetes Last A1c was 5.6  Medication(s): Metoprolol   Lab Results  Component Value  Date   HGBA1C 5.6 10/17/2023   HGBA1C 6.0 07/04/2023   HGBA1C 6.1 (H) 12/20/2022   HGBA1C 5.7 (H) 07/05/2022   HGBA1C 5.6 05/24/2022   Lab Results  Component Value Date   INSULIN  10.0 10/17/2023   INSULIN  7.1 05/24/2022   INSULIN  5.4 02/20/2022   INSULIN  10.0 11/20/2021   INSULIN  6.2 07/07/2020    Plan: Will minimize all refined carbohydrates both sweets and starches.  Will work on the plan and exercise.  Consider both aerobic and resistance training.  Will keep protein, water , and fiber intake high.  Increase Polyunsaturated and Monounsaturated fats to increase satiety and encourage  weight loss.  Aim for 7 to 9 hours of sleep nightly.  Start Wegovy  0.25 mg SQ weekly   Polyphagia Victoria [name] endorses excessive hunger.  Medication(s): none Appetite:  moderately controlled. Cravings are moderately controlled.   Plan: Medication(s): Wegovy  0.25 mg SQ weekly Will increase water , protein and fiber to help assuage hunger.  Will minimize foods that have a high glucose index/load to minimize reactive hypoglycemia.   Victoria [name] denies personal or family history of thyroid  cancer, history of pancreatitis, or current cholelithiasis. Victoria [name] was informed of the most common side effects (nausea, constipation, diarrhea). She was given GLP-1 information sheet.  Patient informed to watch for possible symptoms, such as a lump or swelling in the neck, hoarseness, trouble swallowing, or shortness of breath. If you have any of these symptoms, tell your healthcare provide.   She has been placed on a 500 calorie deficit diet.  She has been advised to exercise at least 150 minutes per week, both cardio and resistance.    Chronic kidney disease stage IIIa:  Her last lab work here at this office showed a GFR of 53, and she had a subsequent lab test on 11/08/2023 with a GFR of 49.  This is being followed by her primary care provider.   Plan:  Discussed her chronic kidney disease.  I told her to stay well-hydrated and to be especially aware if she has any type of GI issues.  She is currently on a diuretic and we discussed the ramifications of this medication.  She will talk with her primary care provider in regard to her diuretic.      Morbid Obesity: Current BMI BMI (Calculated): 48.02   Pharmacotherapy Plan Start  Wegovy  0.25 mg SQ weekly  Victoria [name] is currently in the action stage of change. As such, her goal is to continue with weight loss efforts.  She has agreed to the Category 3 plan.  Exercise goals: All adults should avoid inactivity. Some physical activity is better than none, and adults  who participate in any amount of physical activity gain some health benefits.  Behavioral modification strategies: increasing lean protein intake, no meal skipping, meal planning , increase water  intake, better snacking choices, planning for success, avoiding temptations, keep healthy foods in the home, weigh protein portions, and measure portion sizes.  Victoria [name] has agreed to follow-up with our clinic in 2 weeks.    Labs reviewed today from last visit (CMP, Lipids, HgbA1c, insulin , vitamin D , B 12, thiamine and thyroid  panel).   Objective:   VITALS: Per patient if applicable, see vitals. GENERAL: Alert and in no acute distress. CARDIOPULMONARY: No increased WOB. Speaking in clear sentences.  PSYCH: Pleasant and cooperative. Speech normal rate and rhythm. Affect is appropriate. Insight and judgement are appropriate. Attention is focused, linear, and appropriate.  NEURO: Oriented as arrived to appointment on time with no prompting.  Attestation Statements:    This was prepared with the assistance of Engineer, civil (consulting).  Occasional wrong-word or sound-a-like substitutions may have occurred due to the inherent limitations of voice recognition software.   Clayborne Daring, DO

## 2023-11-20 ENCOUNTER — Telehealth (INDEPENDENT_AMBULATORY_CARE_PROVIDER_SITE_OTHER): Payer: Self-pay | Admitting: *Deleted

## 2023-11-20 ENCOUNTER — Encounter: Payer: Self-pay | Admitting: Cardiology

## 2023-11-20 ENCOUNTER — Telehealth: Payer: Self-pay

## 2023-11-20 ENCOUNTER — Other Ambulatory Visit (HOSPITAL_COMMUNITY): Payer: Self-pay

## 2023-11-20 MED ORDER — EMPAGLIFLOZIN 10 MG PO TABS
10.0000 mg | ORAL_TABLET | Freq: Every day | ORAL | 3 refills | Status: DC
  Filled 2023-11-20 – 2023-11-22 (×2): qty 30, 30d supply, fill #0

## 2023-11-20 NOTE — Telephone Encounter (Signed)
 Call to patient, no answer. Left detailed message per DPR explaining that:  Follow up appt w/ Hao Meng has been scheduled for 12/04/23. Advised patient to call us  if she needs a different appt date/time. Asked patient to let us  know what dose of Lasix  she is currently taking so we can get correct dose reordered.   Advised that Dr. Shlomo would like her to start Jardiance  10 mg daily and have blood work (bmet) in 1 week. Jardiance  script sent in to [facility].    Asked patient to call our office to resolve appointment/lasix  refills.

## 2023-11-20 NOTE — Telephone Encounter (Signed)
 PA SUBMITTED VIA COVERMYMEDS AND RECEIVED DENIAL. PLEASE SEE BELOW:  Your plan only covers this product in the dosage form you asked for when you are unable to take a preferred product in a different dosage form. There is no medical reason that you are required to take this product in this dosage form. For your plan, you may need to try up to three preferred products. We have denied your request because you do not meet any of these conditions. We reviewed the information we had. Your request has been denied. Your doctor can send us  any new or missing information for us  to review. The preferred products for your plan are: liraglutide, Rybelsus, Trulicity. Your doctor may need to get approval from your plan for preferred products. For this product, you may have to meet other criteria.

## 2023-11-21 ENCOUNTER — Encounter: Payer: Self-pay | Admitting: Family

## 2023-11-21 ENCOUNTER — Telehealth: Payer: Self-pay | Admitting: Pharmacy Technician

## 2023-11-21 ENCOUNTER — Other Ambulatory Visit (HOSPITAL_COMMUNITY): Payer: Self-pay

## 2023-11-21 MED ORDER — FUROSEMIDE 40 MG PO TABS: 40.0000 mg | ORAL_TABLET | Freq: Every day | ORAL | 3 refills | Status: AC

## 2023-11-21 MED ORDER — POTASSIUM CHLORIDE CRYS ER 10 MEQ PO TBCR: 10.0000 meq | EXTENDED_RELEASE_TABLET | Freq: Every day | ORAL | 3 refills | Status: DC

## 2023-11-21 NOTE — Telephone Encounter (Addendum)
  Pharmacy Patient Advocate Encounter   Received notification from Pt Calls Messages that prior authorization for jardiance  is required/requested.   Insurance verification completed.   The patient is insured through [facility] .   Per test claim: PA required; PA submitted to above mentioned insurance via Latent Key/confirmation #/EOC [facility] Status is pending

## 2023-11-21 NOTE — Telephone Encounter (Signed)
 Pharmacy Patient Advocate Encounter  Received notification from [employer] that Prior Authorization for jardiance  has been APPROVED from 11/21/23 to 11/20/24   PA #/Case ID/Reference #: [id number]

## 2023-11-21 NOTE — Addendum Note (Signed)
 Addended by: Cherye Gaertner L on: 11/21/2023 08:06 AM   Modules accepted: Orders

## 2023-11-22 ENCOUNTER — Ambulatory Visit (INDEPENDENT_AMBULATORY_CARE_PROVIDER_SITE_OTHER): Admitting: Podiatry

## 2023-11-22 ENCOUNTER — Other Ambulatory Visit (HOSPITAL_COMMUNITY): Payer: Self-pay

## 2023-11-22 ENCOUNTER — Encounter: Payer: Self-pay | Admitting: Podiatry

## 2023-11-22 DIAGNOSIS — G629 Polyneuropathy, unspecified: Secondary | ICD-10-CM

## 2023-11-22 DIAGNOSIS — M722 Plantar fascial fibromatosis: Secondary | ICD-10-CM | POA: Diagnosis not present

## 2023-11-24 NOTE — Progress Notes (Signed)
 Subjective:   Patient ID: Victoria [name], female   DOB: 63 y.o.   MRN: [id number]   HPI Patient presents stating she is finally starting to feel better.  Still having discomfort but improved   ROS      Objective:  Physical Exam  Neurovascular status intact with discomfort in the forefoot bilateral improved.  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  Boot to be used for periods of time but gradual reduction and patient will be seen back as symptoms indicate and may require more aggressive treatment

## 2023-11-29 DIAGNOSIS — I272 Pulmonary hypertension, unspecified: Secondary | ICD-10-CM | POA: Diagnosis not present

## 2023-11-30 LAB — BASIC METABOLIC PANEL WITH GFR
BUN/Creatinine Ratio: 18 (ref 12–28)
BUN: 22 mg/dL (ref 8–27)
CO2: 24 mmol/L (ref 20–29)
Calcium: 9.5 mg/dL (ref 8.7–10.3)
Chloride: 105 mmol/L (ref 96–106)
Creatinine, Ser: 1.25 mg/dL — ABNORMAL HIGH (ref 0.57–1.00)
Glucose: 96 mg/dL (ref 70–99)
Potassium: 4.5 mmol/L (ref 3.5–5.2)
Sodium: 143 mmol/L (ref 134–144)
eGFR: 49 mL/min/1.73 — ABNORMAL LOW (ref >59)

## 2023-12-02 ENCOUNTER — Encounter: Payer: Self-pay | Admitting: Pulmonary Disease

## 2023-12-03 ENCOUNTER — Ambulatory Visit: Payer: Self-pay | Admitting: Physician Assistant

## 2023-12-04 ENCOUNTER — Telehealth: Payer: Self-pay | Admitting: *Deleted

## 2023-12-04 ENCOUNTER — Telehealth: Payer: Self-pay

## 2023-12-04 ENCOUNTER — Ambulatory Visit: Admitting: Nurse Practitioner

## 2023-12-04 ENCOUNTER — Encounter: Payer: Self-pay | Admitting: Physician Assistant

## 2023-12-04 ENCOUNTER — Ambulatory Visit: Attending: Cardiology | Admitting: Physician Assistant

## 2023-12-04 ENCOUNTER — Encounter: Payer: Self-pay | Admitting: Nurse Practitioner

## 2023-12-04 VITALS — BP 130/92 | HR 92 | Resp 16 | Ht 66.0 in | Wt 289.0 lb

## 2023-12-04 VITALS — BP 118/80 | HR 75 | Temp 98.0°F | Ht 66.5 in | Wt 289.0 lb

## 2023-12-04 DIAGNOSIS — R079 Chest pain, unspecified: Secondary | ICD-10-CM | POA: Diagnosis not present

## 2023-12-04 DIAGNOSIS — Z6841 Body Mass Index (BMI) 40.0 and over, adult: Secondary | ICD-10-CM | POA: Diagnosis not present

## 2023-12-04 DIAGNOSIS — R399 Unspecified symptoms and signs involving the genitourinary system: Secondary | ICD-10-CM | POA: Diagnosis not present

## 2023-12-04 DIAGNOSIS — E66813 Obesity, class 3: Secondary | ICD-10-CM

## 2023-12-04 DIAGNOSIS — R0609 Other forms of dyspnea: Secondary | ICD-10-CM | POA: Diagnosis not present

## 2023-12-04 DIAGNOSIS — R0602 Shortness of breath: Secondary | ICD-10-CM | POA: Diagnosis not present

## 2023-12-04 DIAGNOSIS — N393 Stress incontinence (female) (male): Secondary | ICD-10-CM | POA: Diagnosis not present

## 2023-12-04 NOTE — Progress Notes (Signed)
 Office: [phone]  /  Fax: [phone]  WEIGHT SUMMARY AND BIOMETRICS  Weight Lost Since Last Visit: 13lb  Weight Gained Since Last Visit: 0lb   Vitals Temp: 98 F (36.7 C) BP: (!) 130/92 Pulse Rate: 92 SpO2: 96 %   Anthropometric Measurements Height: 5' 6 (1.676 m) Weight: 289 lb (131.1 kg) BMI (Calculated): 46.67 Weight at Last Visit: 302lb Weight Lost Since Last Visit: 13lb Weight Gained Since Last Visit: 0lb Starting Weight: 308lb Total Weight Loss (lbs): 19 lb (8.618 kg)   Body Composition  Body Fat %: 55.5 % Fat Mass (lbs): 160.8 lbs Muscle Mass (lbs): 122.4 lbs Total Body Water  (lbs): 91.8 lbs Visceral Fat Rating : 20   Other Clinical Data Fasting: Yes Labs: No Today's Visit #: 3 Starting Date: 10/17/23     HPI  Chief Complaint: OBESITY  Victoria [name] is here to discuss her progress with her obesity treatment plan. She is on the the Category 3 Plan and states she is following her eating plan approximately 0 % of the time. She states she is exercising 30 minutes 1 days per week.   Interval History:  Since last office visit she has lost 13 pounds. She is not eating on a regular basis and is skipping meals.  She recently saw cardiology and was started on Lasix  and Jardiance .  Since starting Jardiance  she is struggling with not having an appetite.  She is skipping meals and will eat 1-2 times per day.  Upon trying to discuss food recall, she couldn't tell me what she has been eating on a regular basis.  She is drinking water  and diet sodas.  She has a treadmill at home.  She has walked once on it since her last visit last week.  She is struggling with [facility] and has a follow up appt with cardiology today and pulmonary on 01/06/24.  Notes her [facility] has gotten worse.  She reached out to pulmonary on Monday but hasn't heard back from them yet.    Pharmacotherapy for weight loss: She is not currently taking medications  for medical weight loss.      She was  prescribed Wegovy  after her last visit but didn't start it due to cost.   Previous pharmacotherapy for medical weight loss:  Phentermine -stopped due to side effects.    History of Bariatric Surgery ( gastric bypass  )    Year: 2016 Complications: low blood glucose Highest Weight: 292 lbs Lowest Weight: 192 lbs Taking vitamins: no  Hx of deficiencies: anemia  Hx of iron  infusions:  multiple, last iron  infusion in July 2025.   Copied from Dr. Orlinda note on 10/17/23   PHYSICAL EXAM:  Blood pressure 118/80, pulse 75, temperature 98 F (36.7 C), height 5' 6.5 (1.689 m), weight 289 lb (131.1 kg), SpO2 100%. Body mass index is 45.95 kg/m.  General: She is overweight, cooperative, alert, well developed, and in no acute distress. PSYCH: Has normal mood, affect and thought process.   Extremities: No edema.  Neurologic: No gross sensory or motor deficits. No tremors or fasciculations noted.    DIAGNOSTIC DATA REVIEWED:  BMET    Component Value Date/Time   NA 143 11/29/2023 0849   K 4.5 11/29/2023 0849   CL 105 11/29/2023 0849   CO2 24 11/29/2023 0849   GLUCOSE 96 11/29/2023 0849   GLUCOSE 104 (H) 09/05/2023 1626   BUN 22 11/29/2023 0849   CREATININE 1.25 (H) 11/29/2023 0849   CREATININE 1.28 (H) 09/05/2023 1626  CALCIUM  9.5 11/29/2023 0849   GFRNONAA 51 (L) 06/12/2023 1810   GFRNONAA 93 06/30/2020 1105   GFRAA 108 06/30/2020 1105   Lab Results  Component Value Date   HGBA1C 5.6 10/17/2023   HGBA1C 5.7 (H) 08/20/2013   Lab Results  Component Value Date   INSULIN  10.0 10/17/2023   INSULIN  4 09/16/2015   Lab Results  Component Value Date   TSH 0.686 10/17/2023   CBC    Component Value Date/Time   WBC 5.5 11/12/2023 0911   WBC 7.7 09/05/2023 1626   RBC 3.93 11/12/2023 0911   HGB 10.9 (L) 11/15/2023 1050   HGB 13.0 11/12/2023 0911   HGB 12.5 11/08/2023 1211   HGB 12.6 07/09/2016 1529   HCT 32.0 (L) 11/15/2023 1050   HCT 38.6 11/08/2023 1211   HCT 38.2  07/09/2016 1529   PLT 178 11/12/2023 0911   PLT 213 11/08/2023 1211   MCV 99.7 11/12/2023 0911   MCV 103 (H) 11/08/2023 1211   MCV 96.9 07/09/2016 1529   MCH 33.1 11/12/2023 0911   MCHC 33.2 11/12/2023 0911   RDW 14.6 11/12/2023 0911   RDW 13.2 11/08/2023 1211   RDW 13.5 07/09/2016 1529   Iron  Studies    Component Value Date/Time   IRON  113 11/12/2023 0911   IRON  49 09/18/2018 0853   TIBC 336 11/12/2023 0911   TIBC 415 09/18/2018 0853   FERRITIN 172 11/12/2023 0911   FERRITIN 11 (L) 09/18/2018 0853   IRONPCTSAT 34 (H) 11/12/2023 0911   IRONPCTSAT 12 (L) 07/09/2023 1027   Lipid Panel     Component Value Date/Time   CHOL 224 (H) 10/17/2023 0923   TRIG 64 10/17/2023 0923   HDL 92 10/17/2023 0923   CHOLHDL 2 07/04/2023 1206   VLDL 14.6 07/04/2023 1206   LDLCALC 121 (H) 10/17/2023 0923   LDLCALC 106 (H) 12/20/2022 0925   Hepatic Function Panel     Component Value Date/Time   PROT 7.2 10/17/2023 0923   ALBUMIN 4.2 10/17/2023 0923   AST 17 10/17/2023 0923   AST 26 10/16/2018 0903   ALT 16 10/17/2023 0923   ALT 26 10/16/2018 0903   ALKPHOS 73 10/17/2023 0923   BILITOT 0.4 10/17/2023 0923   BILITOT 0.5 10/16/2018 0903   BILIDIR 0.1 09/30/2023 0845   IBILI 0.3 09/30/2023 0845      Component Value Date/Time   TSH 0.686 10/17/2023 0923   Nutritional Lab Results  Component Value Date   VD25OH 23.8 (L) 10/17/2023   VD25OH 51 07/09/2023   VD25OH 43 03/18/2023     ASSESSMENT AND PLAN  TREATMENT PLAN FOR OBESITY:  Recommended Dietary Goals  [name] is currently in the action stage of change. As such, her goal is to continue weight management plan. She has agreed to the Category 3 Plan.  Behavioral Intervention  We discussed the following Behavioral Modification Strategies today: increasing lean protein intake to established goals, decreasing simple carbohydrates , increasing vegetables, increasing fiber rich foods, avoiding skipping meals, increasing water   intake , work on meal planning and preparation, continue to work on maintaining a reduced calorie state, getting the recommended amount of protein, incorporating whole foods, making healthy choices, staying well hydrated and practicing mindfulness when eating., and increase protein intake, fibrous foods (25 grams per day for women, 30 grams for men) and water  to improve satiety and decrease hunger signals. .  Additional resources provided today: NA  Recommended Physical Activity Goals  Marrian hasn't been able to exercise  due to [facility].    Pharmacotherapy We discussed various medication options to help Victoria [name] with her weight loss efforts and we both agreed that she is not currently a candidate for a weight loss medication.  ASSOCIATED CONDITIONS ADDRESSED TODAY  Action/Plan  SOB (shortness of breath) on exertion She has an appt with cardiology today.  I've asked her to keep that appt.  I reached out to pulmonary and they called her while she was in the office seeing me.  She has been scheduled to see Dr. Kara tomorrow.    Class 3 severe obesity due to excess calories with serious comorbidity and body mass index (BMI) of 45.0 to 49.9 in adult [facility])         Return in about 2 weeks (around 12/18/2023).SABRA She was informed of the importance of frequent follow up visits to maximize her success with intensive lifestyle modifications for her multiple health conditions.   ATTESTASTION STATEMENTS:  Reviewed by clinician on day of visit: allergies, medications, problem list, medical history, surgical history, family history, social history, and previous encounter notes.   I personally spent a total of 50 minutes in the care of the patient today including preparing to see the patient, getting/reviewing separately obtained history, performing a medically appropriate exam/evaluation, counseling and educating, referring and communicating with other health care professionals, documenting clinical  information in the EHR, communicating results, and coordinating care.    Corean SAUNDERS. Laelyn Blumenthal FNP-C

## 2023-12-04 NOTE — Patient Instructions (Addendum)
 Medication Instructions:  Your physician recommends that you continue on your current medications as directed. Please refer to the Current Medication list given to you today.   Follow-Up: At [facility], you and your health needs are our priority.  As part of our continuing mission to provide you with exceptional heart care, our providers are all part of one team.  This team includes your primary Cardiologist (physician) and Advanced Practice Providers or APPs (Physician Assistants and Nurse Practitioners) who all work together to provide you with the care you need, when you need it.  Your next appointment:   6 months  Provider:   Wilbert Bihari, MD

## 2023-12-04 NOTE — Progress Notes (Unsigned)
 Cardiology Office Note   Date:  12/04/2023  ID:  Victoria [name], DOB 1960/06/06, MRN [id number] PCP: Perri Ronal PARAS, MD  [facility] Providers Cardiologist:  Wilbert Bihari, MD Electrophysiologist:  Eulas FORBES Furbish, MD     History of Present Illness Victoria [name] is a 63 y.o. female with past medical history of GERD, hiatal hernia, OSA on CPAP and chest pain.  Remote Myoview in 2015 was low risk with very small anterior perfusion defect likely breast attenuation artifact, less likely perfusion abnormality in the distribution of diagonal vessel.  EF was normal.  She underwent a gastric bypass surgery in 2016 and has not felt well since then.  She gets dyspneic with exertion and also talking.  She had ETT in March 2018 and again in May 2022, there was no significant EKG changes.  However during the last ETT in 2022, she was unable to achieve target heart rate.  Coronary CTA obtained on 04/15/2020 showed a coronary calcium  score of 0, normal coronary arteries.  Echocardiogram at the time showed EF 65 to 70%, trivial MR.  Heart monitor in March 2022 showed PVCs, PACs, nonsustained atrial tachycardia and nonsustained VT.    Patient was last seen by Dr. Bihari in April 2025 at which time she was feeling poorly.  She had a knee surgery last year and has gained roughly 40 pounds.  She was also diagnosed with lymphocytic colitis and was on immunosuppression.  Shortness of breath has worsened since weight gain.  She gets dyspneic with minimal activity.  She is on flecainide  and metoprolol  succinate for palpitation and nonsustained atrial tachycardia.  Dr. Bihari recommended 2-week Zio patch and PET stress test.  D-dimer was elevated, subsequent CTA was negative for PE but showed mild bronchial wall thickening and bronchomalacia involving right bronchus.  Pulmonary function test obtained on 09/16/2023 showed FEV1 75%, FVC 69%.  Nonspecific pulmonary function pattern.  PET stress test obtained on 09/17/2023  showed no evidence of infarction, no coronary calcium , small area of apical ischemia in stress without change in the stress flows or function, this was likely artifactual, overall low risk study.  Her monitor showed multiple episode of SVT lasting as long as 20 beats with fastest heart rate 188 corresponds to the patient's symptom of palpitation.  Metoprolol  succinate was increased to 37.5 mg daily.  She was referred to EP service for further evaluation.  Echocardiogram obtained on 07/15/2023 showed EF 70 to 75%, no regional wall motion abnormality, normal RV, mild MR, mild to moderate TR. since the last visit, patient was seen by Dr. Furbish of EP service on 08/07/2023, her atrial ectopy burden was less than 1%, therefore Dr. Furbish recommended to stop the flecainide  and monitor her symptoms.  She may ended up needing another heart monitor in 6 months to reassess the atrial ectopy.   I last saw the patient in August 2025 at which time she was started on Lasix  every other day at the instruction of pulmonology service for leg edema and shortness of breath.  She had noticed a slight improvement in her symptoms.  While we ambulated her in the hallway, she became extremely dyspneic after walking only 30 yards.  O2 saturation stayed at 94% and above throughout the entire walk.  I suspected her symptom was more related to severe obesity and deconditioning.  I did not recommend any further cardiac workup.  Subsequent high-resolution CT showed evidence of enlarged pulmonary trunk concerning for pulmonary hypertension, there was no evidence of fibrotic  interstitial lung disease, no evidence of tracheobronchomalacia.  She was advised to have a right heart cath and a referral to advanced heart failure clinic following the procedure.  Right heart cath performed on 11/15/2023 showed mild pulmonary hypertension with PAP 42/18 with a mean of 30 mmHg, elevated mean wedge pressure 23 mmHg, normal cardiac output and cardiac index.  It  was recommended for her to resume taking Lasix  every other day and consider SGLT2 inhibitor.  Patient has been approved by her insurance for starting Jardiance .  Patient presents today for evaluation of dyspnea on exertion.  There has been report that her O2 saturation was in the 70s when she stepped out of the shower a few days ago, however in the office today, O2 saturation remained 96%.  She is concerned that some of her symptoms is similar to when she had pneumonia earlier this year.  She has some intermittent chest pain since the last visit, however her chest pain does not have clear correlation with exertion and would only last a few seconds each time.  I reviewed her case with DOD Dr. Peter Swaziland who did her recent heart right heart cath.  She had clean coronary artery on previous coronary CTA in February 2022 with 0 calcium  score and reassuring PET stress test.  She also had negative CTA of the chest in April for PE.  At this time, she appears to be euvolemic on exam.  We do not recommend any further cardiac testing.  I discussed with Dr. Swaziland regarding possibility of referring her to a heart failure service however it was felt that heart failure service is unlikely to offer her anything that would significantly improve her breathing.  Patient is understandably frustrated.  I offered to obtain chest x-ray or chest CT to rule out possibility of acute lung disease, however she has upcoming visit with pulmonology service tomorrow.  Dr. Swaziland does feel she has some degree of diastolic dysfunction, although this is likely related to her weight.  She is on 40 mg daily of Lasix  and Jardiance .  She says the Jardiance  has decreased her appetite, and she has managed to lose 13 pounds.  Although she was previously evaluated to possibly start Wegovy  for weight loss, however this has not been approved by insurance service.  She denies any urinary frequency or dysuria on Jardiance  to suggest UTI.  ROS:   Patient  complains of fatigue, weakness, shortness of breath with exertion and intermittent chest discomfort.  Studies Reviewed      Cardiac Studies & Procedures   ______________________________________________________________________________________________ CARDIAC CATHETERIZATION  CARDIAC CATHETERIZATION 11/15/2023  Conclusion   Hemodynamic findings consistent with mild pulmonary hypertension.  Mild pulmonary HTN PAP 42/18 mean 30 mm Hg. Elevated LV filling pressures PCWP 27/28 mean 23 mm Hg Normal cardiac output. 7.2 L/min, index 3  Plan: recommend she resume taking lasix  every other day. Sodium restriction. Consider SGLT 2 inhibitor.  Findings Coronary Findings Diagnostic  Dominance: Right  No diagnostic findings have been documented. Intervention  No interventions have been documented.   STRESS TESTS  NM PET CT CARDIAC PERFUSION MULTI W/ABSOLUTE BLOODFLOW 09/17/2023  Narrative   Findings are consistent with no infarction.  There is no coronary calcium .  There is a small area of apical ischemia in stress without change in stress flows or function.  This may be artifactual in nature. The study is low risk.   LV perfusion is abnormal. Defect 1: There is a small defect with mild reduction in uptake  present in the apical apex location(s) that is reversible. There is normal wall motion in the defect area. Consistent with artifact.   Rest left ventricular function is normal. Stress left ventricular function is normal. End diastolic cavity size is normal. End systolic cavity size is normal.   Myocardial blood flow was computed to be 0.61ml/g/min at rest and 1.56ml/g/min at stress. Global myocardial blood flow reserve was 2.00 and was normal.   Coronary calcium  was absent on the attenuation correction CT images.   Electronically Signed  By: Stanly Leavens M.D.  EXAM: OVER-READ INTERPRETATION  CT CHEST  The following report is a limited chest CT over-read performed by radiologist  Dr. Elsie Ko [facility] on 09/17/2023. This over-read does not include interpretation of cardiac or coronary anatomy or pathology nor does it include evaluation of the PET data. The cardiac PET-CT interpretation by the cardiologist is attached.  COMPARISON:  Chest radiograph 09/13/2023.  Chest CTA 06/12/2023  FINDINGS: Mediastinum/Nodes: No enlarged lymph nodes within the visualized mediastinum.  Lungs/Pleura: There is no pleural effusion. Atelectasis at both lung bases, similar to previous CT. Persistent low lung volumes.  Upper abdomen: No acute findings within the visualized upper abdomen. Postsurgical changes from gastric bypass procedure.  Musculoskeletal/Chest wall: No chest wall mass or suspicious osseous findings within the visualized chest. Chronic endplate sclerosis within the lower thoracic spine.  IMPRESSION: 1. No acute extra cardiac findings. 2. Persistent low lung volumes with bibasilar atelectasis.   Electronically Signed By: Elsie Perone M.D. On: 09/17/2023 11:29   ECHOCARDIOGRAM  ECHOCARDIOGRAM COMPLETE 07/15/2023  Narrative ECHOCARDIOGRAM REPORT    Patient Name:   [facility] ELDEN DARING Date of Exam: 07/15/2023 Medical Rec #:  [id number]                 Height:       67.0 in Accession #:    [id number]                Weight:       301.0 lb Date of Birth:  04-10-1960                BSA:          2.406 m Patient Age:    62 years                  BP:           100/80 mmHg Patient Gender: F                         HR:           69 bpm. Exam Location:  [address]  Procedure: 2D Echo, 3D Echo and Strain Analysis (Both Spectral and Color Flow Doppler were utilized during procedure).  Indications:    R06.02 SOB  History:        Patient has prior history of Echocardiogram examinations, most recent 10/12/2021. Signs/Symptoms:Fatigue. Palpitations. Prediabetes. Morbid obesity.  Sonographer:    Jon Hacker RCS Referring  Phys: [phone] TRACI R TURNER  IMPRESSIONS   1. Left ventricular ejection fraction, by estimation, is 70 to 75%. The left ventricle has hyperdynamic function. The left ventricle has no regional wall motion abnormalities. Left ventricular diastolic parameters were normal. The average left ventricular global longitudinal strain is 18.6 %. The global longitudinal strain is normal. 2. Right ventricular systolic function is normal. The right ventricular size is normal. 3. The mitral valve is normal in  structure. Mild mitral valve regurgitation. 4. Tricuspid valve regurgitation is mild to moderate. 5. The aortic valve is tricuspid. Aortic valve regurgitation is not visualized. 6. The inferior vena cava is normal in size with greater than 50% respiratory variability, suggesting right atrial pressure of 3 mmHg.  Comparison(s): The left ventricular function is unchanged.  FINDINGS Left Ventricle: Left ventricular ejection fraction, by estimation, is 70 to 75%. The left ventricle has hyperdynamic function. The left ventricle has no regional wall motion abnormalities. The average left ventricular global longitudinal strain is 18.6 %. Strain was performed and the global longitudinal strain is normal. The left ventricular internal cavity size was normal in size. There is no left ventricular hypertrophy. Left ventricular diastolic parameters were normal.  Right Ventricle: The right ventricular size is normal. Right vetricular wall thickness was not assessed. Right ventricular systolic function is normal.  Left Atrium: Left atrial size was normal in size.  Right Atrium: Right atrial size was normal in size.  Pericardium: There is no evidence of pericardial effusion.  Mitral Valve: The mitral valve is normal in structure. Mild mitral valve regurgitation.  Tricuspid Valve: The tricuspid valve is normal in structure. Tricuspid valve regurgitation is mild to moderate.  Aortic Valve: The aortic valve is  tricuspid. Aortic valve regurgitation is not visualized.  Pulmonic Valve: The pulmonic valve was not well visualized. Pulmonic valve regurgitation is not visualized. No evidence of pulmonic stenosis.  Aorta: The aortic root and ascending aorta are structurally normal, with no evidence of dilitation.  Venous: The inferior vena cava is normal in size with greater than 50% respiratory variability, suggesting right atrial pressure of 3 mmHg.  IAS/Shunts: No atrial level shunt detected by color flow Doppler.   LEFT VENTRICLE PLAX 2D LVIDd:         4.75 cm   Diastology LVIDs:         2.69 cm   LV e' medial:    10.90 cm/s LV PW:         1.16 cm   LV E/e' medial:  11.5 LV IVS:        0.96 cm   LV e' lateral:   12.10 cm/s LVOT diam:     2.10 cm   LV E/e' lateral: 10.3 LV SV:         112 LV SV Index:   46        2D Longitudinal Strain LVOT Area:     3.46 cm  2D Strain GLS (A4C):   -20.8 % 2D Strain GLS (A3C):   15.9 % 2D Strain GLS (A2C):   -25.9 % 2D Strain GLS Avg:     18.6 %  3D Volume EF: 3D EF:        58 % LV EDV:       144 ml LV ESV:       61 ml LV SV:        83 ml  RIGHT VENTRICLE RV Basal diam:  3.86 cm RV S prime:     10.90 cm/s TAPSE (M-mode): 2.4 cm  LEFT ATRIUM             Index        RIGHT ATRIUM           Index LA diam:        3.20 cm 1.33 cm/m   RA Area:     15.90 cm LA Vol (A2C):   45.6 ml 18.95 ml/m  RA Volume:   47.60  ml  19.78 ml/m LA Vol (A4C):   45.8 ml 19.04 ml/m LA Biplane Vol: 48.9 ml 20.32 ml/m AORTIC VALVE LVOT Vmax:   144.00 cm/s LVOT Vmean:  87.600 cm/s LVOT VTI:    0.322 m  AORTA Ao Root diam: 3.70 cm Ao Asc diam:  3.80 cm  MITRAL VALVE MV Area (PHT): 4.31 cm     SHUNTS MV Decel Time: 176 msec     Systemic VTI:  0.32 m MV E velocity: 125.00 cm/s  Systemic Diam: 2.10 cm MV A velocity: 99.60 cm/s MV E/A ratio:  1.26  Vina Gull MD Electronically signed by Vina Gull MD Signature Date/Time: 07/15/2023/4:50:24 PM    Final     MONITORS  LONG TERM MONITOR (3-14 DAYS) 07/08/2023  Narrative   Predominant rhythm was normal sinus rhythm with an average heartbeat of 69 bpm and range from 46 to 188 bpm.   26 episodes of SVT longest lasting 20 beats with max heart rate 180 bpm.  Patient was symptomatic during these episodes   Rare PACs, atrial couplets and triplets   Rare PVCs   Patch Wear Time:  12 days and 20 hours (2025-04-19T14:50:46-0400 to 2025-05-02T11:34:47-0400)  Patient had a min HR of 46 bpm, max HR of 188 bpm, and avg HR of 69 bpm. Predominant underlying rhythm was Sinus Rhythm. 26 Supraventricular Tachycardia runs occurred, the run with the fastest interval lasting 6 beats with a max rate of 188 bpm, the longest lasting 20 beats with an avg rate of 120 bpm. Supraventricular Tachycardia was detected within +/- 45 seconds of symptomatic patient event(s). Isolated SVEs were rare (<1.0%), SVE Couplets were rare (<1.0%), and SVE Triplets were rare (<1.0%). Isolated VEs were rare (<1.0%), and no VE Couplets or VE Triplets were present.       ______________________________________________________________________________________________      Risk Assessment/Calculations          Physical Exam VS:  BP (!) 130/92 (BP Location: Left Arm, Patient Position: Sitting, Cuff Size: Large)   Pulse 92   Resp 16   Ht 5' 6 (1.676 m)   Wt 289 lb (131.1 kg)   SpO2 96%   BMI 46.65 kg/m        Wt Readings from Last 3 Encounters:  12/04/23 289 lb (131.1 kg)  12/04/23 289 lb (131.1 kg)  11/19/23 (!) 302 lb (137 kg)    GEN: Well nourished, well developed in no acute distress NECK: No JVD; No carotid bruits CARDIAC: RRR, no murmurs, rubs, gallops RESPIRATORY: Diminished breath sounds in bilateral bases ABDOMEN: Soft, non-tender, non-distended EXTREMITIES:  No edema; No deformity   ASSESSMENT AND PLAN  Dyspnea with exertion: This is a chronic issue that has been worsening according to the patient especially  this year.  She was seen by Dr. Shlomo for similar issue earlier this year who felt this is related to deconditioning and morbid obesity.  Subsequent CTA of the chest yeah April 2025 was negative for PE.  She had PET stress test in July that was negative other than small apical ischemia that was felt to be artifactual.  She had clean coronary artery on previous coronary CTA in February 2022.  PFTs this year showed nonspecific finding although fairly significant drop in FEV1.  Subsequent high-resolution CT showed dilated pulmonary artery, but no pulmonary scarring or acute finding.  Eventually she underwent a right heart cath by Dr. Swaziland on 11/15/2023 that showed mild pulmonary hypertension with mean PA peak pressure 30 mmHg, wedge pressure  23 mmHg.  Normal cardiac output and index.  She was started on Lasix  40 mg daily and had Jardiance  added to her medical regimen.  This has not changed her overall symptom.  In fact she felt her shortness of breath has worsened since.  She did manage to lose 13 pounds.  Basic metabolic panel shows stable renal function and electrolytes.  I am hoping with additional weight loss her symptom will improve.  She is concerned about possibility of pneumonia, on exam, top portion of her lung is very clear, however she has diminished breath sounds in bilateral bases.  She has upcoming visit with pulmonology service tomorrow.  I discussed her case with Dr. Peter [place]and who did her recent cath, at this time, we do not recommend any further cardiac testing.       Dispo: Follow-up with Dr. Shlomo in April as previously recommended.  MD only  Signed, Scot Ford, PA

## 2023-12-04 NOTE — Telephone Encounter (Signed)
 Spoke with Victoria [name] she has an appointment scheduled with Dr.Dewald.

## 2023-12-04 NOTE — Telephone Encounter (Signed)
 Converted from [employer]:  Good afternoon sir, I did have the right cardiac completed on 9/19 which showed mild PH and fluid volume overload,i was put on lasix  daily and started on jardiance  10 mg,i am still using the budesonide  and rescue inhaler,sir every little task i do i still find myself sob,i just finished taking a shower and got very sob i checked my oxygen level and it was 70%, keep pulse ox on and took rescue inhaler it increase to 78%,my question is there a diffrent inhaler that would help me more than what i am already doing? My f/u with cardiology is 10/8 and my f/u with u is in Nov,please advise.  I called and spoke with patient, she has just recently started getting very sob with bathing, states her sats drop into the 70's and this is new for her.  She states her fingers are warm when she checks her sats.  I advised her to bring her pulse ox with her so we can compare her numbers with ours to see how they compare.  I advised her that with sats in the 70's she should really be seen in the ER.  She states she hates to go to the ED.  I let her know that I understand that no one wants to go to the ER. However with sats that low, that is the best place to get the care she needs.  She does not wear oxygen.  She sees her cardiologist today.  She was previously walked in their office, but could only walk about 30 feet and her sats did not drop.  She said she has never walked in our office to see if her sats drop here.  I advised her we will need to walk her in the office to see if her sats drop while she is here.  Scheduled her to see Dr. Kara in a 30 minute slot given her new diagnosis and she will need a walk to see if she qualifies for oxygen.  She knows to arrive at 10:15 am for check in for a 10:30 am appointment.  She verified understanding .  Nothing further needed.

## 2023-12-05 ENCOUNTER — Encounter: Payer: Self-pay | Admitting: Pulmonary Disease

## 2023-12-05 ENCOUNTER — Ambulatory Visit: Admitting: Pulmonary Disease

## 2023-12-05 VITALS — BP 113/80 | HR 81 | Wt 293.4 lb

## 2023-12-05 DIAGNOSIS — G8929 Other chronic pain: Secondary | ICD-10-CM

## 2023-12-05 DIAGNOSIS — M2550 Pain in unspecified joint: Secondary | ICD-10-CM

## 2023-12-05 DIAGNOSIS — I272 Pulmonary hypertension, unspecified: Secondary | ICD-10-CM | POA: Diagnosis not present

## 2023-12-05 DIAGNOSIS — G4733 Obstructive sleep apnea (adult) (pediatric): Secondary | ICD-10-CM | POA: Diagnosis not present

## 2023-12-05 DIAGNOSIS — I5032 Chronic diastolic (congestive) heart failure: Secondary | ICD-10-CM | POA: Diagnosis not present

## 2023-12-05 DIAGNOSIS — R0902 Hypoxemia: Secondary | ICD-10-CM

## 2023-12-05 DIAGNOSIS — R760 Raised antibody titer: Secondary | ICD-10-CM

## 2023-12-05 DIAGNOSIS — I071 Rheumatic tricuspid insufficiency: Secondary | ICD-10-CM | POA: Diagnosis not present

## 2023-12-05 LAB — C-REACTIVE PROTEIN: CRP: 1.8 mg/dL (ref 0.5–20.0)

## 2023-12-05 LAB — BRAIN NATRIURETIC PEPTIDE: Pro B Natriuretic peptide (BNP): 22 pg/mL (ref 0.0–100.0)

## 2023-12-05 LAB — SEDIMENTATION RATE: Sed Rate: 49 mm/h — ABNORMAL HIGH (ref 0–30)

## 2023-12-05 LAB — CK: Total CK: 65 U/L (ref 17–177)

## 2023-12-05 LAB — TSH: TSH: 0.1 u[IU]/mL — ABNORMAL LOW (ref 0.35–5.50)

## 2023-12-05 NOTE — Progress Notes (Signed)
 Synopsis: Acute visit  Subjective:   PATIENT ID: Victoria [name] GENDER: female DOB: 12-12-1960, MRN: [id number]  HPI  Chief Complaint  Patient presents with   Medical Management of Chronic Issues    PT states pressure in chest,SOB mostly mobile, Tuesday light headed, blacking out and cough. Started jardance on 11/25/23.    Victoria [name] is a 63 year old woman, never smoker with OSA and pulmonary hypertension who returns for follow up.  OV 09/23/23 She was seen by Izetta Rouleau, NP 07/04/23 and 07/15/23 for dyspnea. She did not seem to respond to high dose symbicort  inhaler therapy or steroids + zpak after visit with m3 06/19/23. She was noted to have elevated BNP and lower extremity edema, with response to lasix  in both improvement of her dyspnea and resolution of her edema. She was instructed to be more consistent with her CPAP. Echo 5/19 shows hyperdynamic LV 70-75% and normal RV size and systolic function. Mild mitral valve regurgitation. Mild to moderate tricuspid valve regurgitation.  She experiences ongoing shortness of breath and leg swelling. She is prescribed Lasix  every other day but does not take it regularly due to frequent urination.  A CT PET cardiac scan last week showed basilar atelectasis, similar to a previous scan. She was diagnosed with pneumonia on July 8th and treated with antibiotics and steroids, initially improving but worsening over the past week. She uses Symbicort , two puffs twice a day, and albuterol  more frequently. No mucus production is noted, but she has an occasional cough and denies wheezing recently.  Breathing tests a week ago showed a decline in some measurements compared to a couple of years ago. Her diffusion capacity has decreased from two years ago but remains within normal limits.  She has a family history of lung cancer in an uncle who was a smoker. She is not a smoker.  OV 12/05/23 She experiences worsening shortness of breath, especially  during activities like showering and walking. Oxygen saturation drops to 70% post-shower, improving to 78% with a rescue inhaler. She feels short of breath at rest with chest pressure. She is on Lasix  and Jardiance  for fluid volume overload but feels her condition has worsened.  She experiences dizziness and blurred vision while using a stairlift and exiting a car. Pulmonary hypertension was confirmed by heart catheterization. She was treated for pneumonia in July and bronchitis in August with steroids and antibiotics, which initially improved her symptoms.  She uses CPAP for sleep apnea for over a year. She denies any history of blood clots but has had fainting episodes related to anemia. She retired early last year due to declining health after working for 21 years at [employer].  Past Medical History:  Diagnosis Date   Anxiety    Back pain    Bursitis    right shoulder   Chronic diastolic heart failure (HCC)    Chronic kidney disease    hematuria, has been worked up, her normal   Chronic leukopenia    intermittant since 2010, followed by Dr. Perri now   Chronic neck pain    Constipation    Degenerative joint disease of low back 09/02/2015   Dr. Hiram   Displacement of lumbar intervertebral disc    Dysrhythmia    Tachycardia, PVCs, PACs   Edema of both lower legs    Fibrocystic breast    Gallbladder problem    GERD (gastroesophageal reflux disease)    Headache    per pt more stress/tension  Heart palpitations    SEE EPIC ENCOUTNER , CARDIOLOGY DR. WILBERT TURNER 2018; reports on 05-16-17  i haven't had any bouts of those lately     Hemorrhoids    Hidradenitis suppurativa    History of Clostridium difficile infection 12/2010   History of exercise intolerance    ETT on 04-27-2016-- negative ([facility] treadmill score 7)   History of Helicobacter pylori infection 2001 and 09/ 2012   HSV-2 infection    genital   Hx of adenomatous colonic polyps 10/17/2007   Hydradenitis    per pt  currently treated with Humira  injection   Hypertension    Hyperthyroidism    Hypocupremia    Hypothyroidism    Hypothyroidism, postsurgical 1980   IBS (irritable bowel syndrome)    Insomnia    Iron  deficiency    Joint pain    Leukopenia    Lumbosacral radiculopathy    Lymphocytic colitis 05/14/2022   dx from colonoscopy   Medial meniscus tear    right knee   Mild obstructive sleep apnea    Mitral regurgitation    Mild by echo 06/2023   Numbness and tingling of right arm    Obesity    Osteoarthritis    Palpitations    Pernicious anemia    b12 def   Peroneal neuropathy    PONV (postoperative nausea and vomiting)    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   Prediabetes    Reactive hypoglycemia followed by pcp   post gastrectomy dumping syndrome   SOB (shortness of breath)    Spinal stenosis    Tendonitis    right shoulder   Tricuspid regurgitation    Mild to moderate echo 06/2023   Varicose veins    Vertigo    Vitamin B 12 deficiency    Vitamin D  deficiency      Family History  Problem Relation Age of Onset   High blood pressure Mother    Hypertension Mother    Headache Mother    High Cholesterol Mother    Thyroid  disease Mother    Obesity Mother    Kidney disease Mother    Hypertension Father    Diabetes Father    High blood pressure Father    High Cholesterol Father    Heart disease Father    Obesity Father    Diabetes Sister    Hypertension Sister    Thyroid  disease Brother    Thyroid  disease Maternal Aunt    Migraines Neg Hx      Social History   Socioeconomic History   Marital status: Married    Spouse name: Adriana   Number of children: 2   Years of education: Not on file   Highest education level: Not on file  Occupational History   Occupation: Charity fundraiser - Adult nurse GI    Employer: [employer]  Tobacco Use   Smoking status: Never   Smokeless tobacco: Never  Vaping Use   Vaping status: Never Used  Substance and Sexual Activity   Alcohol use:  No   Drug use: No   Sexual activity: Not Currently    Partners: Male    Birth control/protection: Surgical    Comment: hysterectomy  Other Topics Concern   Not on file  Social History Narrative   Lives at home with spouse   Right handed   Caffeine: diet zero mtn dew, occasionally     2025 - Recently retired from [employer] as an Charity fundraiser, previously worked Environmental education officer, now working 1  day/ week. Divorced, remarried - husband is disabled. 2 adult twin daughters. Non-smoker or drinker.    Social Drivers of Corporate investment banker Strain: Not on file  Food Insecurity: Low Risk  (07/12/2023)   Received from [facility]   Hunger Vital Sign    Within the past 12 months, you worried that your food would run out before you got money to buy more: Never true    Within the past 12 months, the food you bought just didn't last and you didn't have money to get more. : Never true  Transportation Needs: No Transportation Needs (07/12/2023)   Received from [facility]    In the past 12 months, has lack of reliable transportation kept you from medical appointments, meetings, work or from getting things needed for daily living? : No  Physical Activity: Not on file  Stress: Not on file  Social Connections: Not on file  Intimate Partner Violence: Not At Risk (07/13/2022)   Humiliation, Afraid, Rape, and Kick questionnaire    Fear of Current or Ex-Partner: No    Emotionally Abused: No    Physically Abused: No    Sexually Abused: No     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     Outpatient Medications Prior to Visit  Medication Sig Dispense Refill   albuterol  (VENTOLIN  HFA) 108 (90 Base) MCG/ACT inhaler Inhale 2 puffs into the lungs every 4 hours as needed for wheezing or shortness of breath. 18 g 3   benzonatate  (TESSALON ) 200 MG capsule Take 1 capsule (200 mg total) by mouth 3 (three) times  daily as needed for cough. 30 capsule 1   budesonide  (ENTOCORT EC ) 3 MG 24 hr capsule Take 3 capsules (9 mg total) by mouth in the morning. 270 capsule 3   budesonide -formoterol  (SYMBICORT ) 160-4.5 MCG/ACT inhaler Inhale 2 puffs into the lungs 2 (two) times daily. 1 each 12   buPROPion  (WELLBUTRIN  XL) 150 MG 24 hr tablet Take 1 tablet (150 mg total) by mouth daily. 90 tablet 1   cyanocobalamin  (DODEX ) 1000 MCG/ML injection INJECT 1 ML INTRAMUSCULARLY EVERY MONTH 3 mL 11   empagliflozin  (JARDIANCE ) 10 MG TABS tablet Take 1 tablet (10 mg total) by mouth daily before breakfast. 90 tablet 3   estradiol  (ESTRACE  VAGINAL) 0.1 MG/GM vaginal cream Place 1 g vaginally 3 (three) times a week. 42.5 g 3   estradiol  (VIVELLE -DOT) 0.075 MG/24HR Place 1 patch onto the skin 2 (two) times a week. 8 patch 5   furosemide  (LASIX ) 40 MG tablet Take 1 tablet (40 mg total) by mouth daily. 90 tablet 3   gabapentin  (NEURONTIN ) 600 MG tablet TAKE 1 TABLET BY MOUTH 3 TIMES A DAY (Patient taking differently: Take 600 mg by mouth at bedtime.) 90 tablet 3   HYDROcodone -acetaminophen  (NORCO) 10-325 MG tablet One tab every 6 hours as needed for pain 20 tablet 0   HYRIMOZ  40 MG/0.4ML SOAJ Inject 40 mg into the skin once a week.     levothyroxine  (SYNTHROID ) 112 MCG tablet Take 1 tablet (112 mcg total) by mouth daily. 90 tablet 3   LORazepam  (ATIVAN ) 1 MG tablet TAKE 1 TABLET BY MOUTH TWICE A DAY FOR ANXIETY (Patient taking differently: Take 1 mg by mouth daily as needed for sleep or anxiety.) 60 tablet 2   metoprolol  succinate (TOPROL -XL) 25 MG 24 hr tablet Take 1.5 tablets (37.5 mg  total) by mouth daily. 45 tablet 11   potassium chloride  (KLOR-CON  M) 10 MEQ tablet Take 1 tablet (10 mEq total) by mouth daily. Take 1 tablet every other day with lasix  (furosemide ) 90 tablet 3   promethazine  (PHENERGAN ) 25 MG tablet Take 1 tablet (25 mg total) by mouth every 8 (eight) hours as needed for nausea and/or vomiting 30 tablet 5    promethazine -dextromethorphan (PROMETHAZINE -DM) 6.25-15 MG/5ML syrup Take 5 mLs by mouth 4 (four) times daily as needed for cough.     semaglutide -weight management (WEGOVY ) 0.25 MG/0.5ML SOAJ SQ injection Inject 0.25 mg into the skin once a week. 2 mL 0   tiZANidine  (ZANAFLEX ) 4 MG tablet TAKE 1 TABLET BY MOUTH 3 TIMES DAILY. (Patient taking differently: Take 4 mg by mouth at bedtime.) 90 tablet 3   Ubrogepant  (UBRELVY ) 100 MG TABS Take 1 tablet (100 mg total) by mouth every 2 (two) hours as needed. Maximum 200mg  a day. 16 tablet 11   Vitamin D , Ergocalciferol , (DRISDOL ) 1.25 MG (50000 UNIT) CAPS capsule TAKE 1 CAPSULE BY MOUTH ONE TIME PER WEEK 4 capsule 2   Facility-Administered Medications Prior to Visit  Medication Dose Route Frequency Provider Last Rate Last Admin   gadobenate dimeglumine  (MULTIHANCE ) injection 20 mL  20 mL Intravenous Once PRN Ines Onetha NOVAK, MD        Review of Systems  Constitutional:  Negative for chills, fever, malaise/fatigue and weight loss.  HENT:  Negative for congestion, sinus pain and sore throat.   Eyes: Negative.   Respiratory:  Positive for shortness of breath. Negative for cough, hemoptysis, sputum production and wheezing.   Cardiovascular:  Negative for chest pain, palpitations, orthopnea, claudication and leg swelling.  Gastrointestinal:  Negative for abdominal pain, heartburn, nausea and vomiting.  Genitourinary: Negative.   Musculoskeletal:  Negative for joint pain and myalgias.  Skin:  Negative for rash.  Neurological:  Negative for weakness.  Endo/Heme/Allergies: Negative.   Psychiatric/Behavioral: Negative.        Objective:   Vitals:   12/05/23 1102  BP: 113/80  Pulse: 81  SpO2: 100%  Weight: 293 lb 6.4 oz (133.1 kg)    Office Visit from 12/05/2023 in [facility]   12/05/2023    1058  Resting   Supplemental oxygen during test? --  Resting Heart Rate 81  Resting Sp02 100  Lap 1 (250 feet)    HR 86  02 Sat 82  Lap 2 (250 feet)   HR --  02 Sat --  Lap 3 (250 feet)   HR --  02 Sat --  Tech Comments: Patient could not do a lap O2 dropped to 82% placed on 2-3 L of countious, O2 100%    Physical Exam Constitutional:      General: She is not in acute distress.    Appearance: Normal appearance. She is obese.  Eyes:     General: No scleral icterus.    Conjunctiva/sclera: Conjunctivae normal.  Cardiovascular:     Rate and Rhythm: Normal rate and regular rhythm.  Pulmonary:     Breath sounds: No wheezing, rhonchi or rales.  Musculoskeletal:     Right lower leg: No edema.     Left lower leg: No edema.  Skin:    General: Skin is warm and dry.  Neurological:     General: No focal deficit present.    CBC    Component Value Date/Time   WBC 5.5 11/12/2023 0911   WBC 7.7  09/05/2023 1626   RBC 3.93 11/12/2023 0911   HGB 10.9 (L) 11/15/2023 1050   HGB 13.0 11/12/2023 0911   HGB 12.5 11/08/2023 1211   HGB 12.6 07/09/2016 1529   HCT 32.0 (L) 11/15/2023 1050   HCT 38.6 11/08/2023 1211   HCT 38.2 07/09/2016 1529   PLT 178 11/12/2023 0911   PLT 213 11/08/2023 1211   MCV 99.7 11/12/2023 0911   MCV 103 (H) 11/08/2023 1211   MCV 96.9 07/09/2016 1529   MCH 33.1 11/12/2023 0911   MCHC 33.2 11/12/2023 0911   RDW 14.6 11/12/2023 0911   RDW 13.2 11/08/2023 1211   RDW 13.5 07/09/2016 1529   LYMPHSABS 1.7 11/12/2023 0911   LYMPHSABS 1.3 09/03/2022 1425   LYMPHSABS 1.6 07/09/2016 1529   MONOABS 0.4 11/12/2023 0911   MONOABS 0.3 07/09/2016 1529   EOSABS 0.0 11/12/2023 0911   EOSABS 0.1 09/03/2022 1425   BASOSABS 0.0 11/12/2023 0911   BASOSABS 0.0 09/03/2022 1425   BASOSABS 0.0 07/09/2016 1529      Latest Ref Rng & Units 11/29/2023    8:49 AM 11/15/2023   10:50 AM 11/15/2023   10:47 AM  BMP  Glucose 70 - 99 mg/dL 96     BUN 8 - 27 mg/dL 22     Creatinine 9.42 - 1.00 mg/dL 8.74     BUN/Creat Ratio 12 - 28 18     Sodium 134 - 144 mmol/L 143  140  140   Potassium 3.5  - 5.2 mmol/L 4.5  3.9  3.9   Chloride 96 - 106 mmol/L 105     CO2 20 - 29 mmol/L 24     Calcium  8.7 - 10.3 mg/dL 9.5      Chest imaging: CT Chest PE 06/12/23 1. No acute pulmonary embolism. 2. Mild bronchial wall thickening in keeping with airway inflammation. 3. 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 4. Mild elevation of the left hemidiaphragm. 5. 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  PFT:    Latest Ref Rng & Units 09/16/2023    8:49 AM 02/15/2022    9:05 AM  PFT Results  FVC-Pre L 2.52  3.01   FVC-Predicted Pre % 69  82   FVC-Post L 2.58  2.99   FVC-Predicted Post % 71  81   Pre FEV1/FVC % % 83  86   Post FEV1/FCV % % 84  86   FEV1-Pre L 2.10  2.59   FEV1-Predicted Pre % 75  91   FEV1-Post L 2.17  2.58   DLCO uncorrected ml/min/mmHg 17.15  20.51   DLCO UNC% % 77  92   DLCO corrected ml/min/mmHg 17.66  20.91   DLCO COR %Predicted % 79  93   DLVA Predicted % 107  109   TLC L 4.85  5.02   TLC % Predicted % 88  91   RV % Predicted % 103  90     Labs:  Path:  Echo:  Heart Catheterization:       Assessment & Plan:   Chronic diastolic heart failure (HCC) - Plan: Ambulatory Referral for DME  Post-capillary pulmonary hypertension (HCC) - Plan: Aldolase, ANA, ANCA screen with reflex titer, Anti-DNA antibody, double-stranded, Anti-scleroderma antibody, Anti-Smith antibody, CK, C-reactive protein, CYCLIC CITRUL PEPTIDE ANTIBODY, IGG/IGA, TSH, Sjogrens syndrome-A extractable nuclear antibody, Sjogrens syndrome-B extractable nuclear antibody, Sedimentation rate, Rheumatoid factor, Brain natriuretic peptide, ECHOCARDIOGRAM COMPLETE BUBBLE STUDY, AMB referral to pulmonary rehabilitation, Brain natriuretic peptide,  Rheumatoid factor, Sedimentation rate, Sjogrens syndrome-B extractable nuclear antibody, Sjogrens syndrome-A extractable nuclear antibody, TSH, CYCLIC CITRUL PEPTIDE ANTIBODY, IGG/IGA,  C-reactive protein, CK, Anti-Smith antibody, Anti-scleroderma antibody, Anti-DNA antibody, double-stranded, ANCA screen with reflex titer, ANA, Aldolase, Ambulatory Referral for DME  Obstructive sleep apnea - Plan: Pulse oximetry, overnight  Discussion: Victoria [name] is a 63 year old female with OSA who returns with shortness of breath.  Pulmonary hypertension with chronic hypoxemia and diastolic heart failure Pulmonary hypertension confirmed by heart catheterization, postcapillary with wedge pressure of 23. Significant dyspnea, dizziness, and blurred vision noted. Chronic hypoxemia indicated by oxygen desaturation during activity. Diastolic heart failure contributes to fluid overload. Pulmonary vasodilators not recommended. - Order supplemental oxygen and portable oxygen concentrator for activity. - Conduct overnight oxygen test with CPAP on room air. - Refer to pulmonary rehabilitation. - Check echocardiogram with bubble study. - Continue lasix  per cardiology  Tricuspid valve regurgitation Mild to moderate tricuspid valve regurgitation potentially contributing to pulmonary hypertension. - Repeat echocardiogram with bubble study.  Obstructive sleep apnea Obstructive sleep apnea managed with CPAP. Concerns about oxygen desaturation during sleep affecting pulmonary hypertension and heart function. - Conduct overnight oxygen test with CPAP.  Positive antinuclear antibody (ANA) with evaluation for autoimmune disease Positive ANA with ongoing evaluation for autoimmune disease. Previous rheumatology evaluation did not indicate lupus. Further assessment needed. - Review rheumatologist's report and lab work. - Repeat inflammatory labs today  Chronic joint pain with right knee pain, neck pain, and right hand finger stiffness and cramping - Review rheumatologist's report and lab work. - Consider further evaluation if symptoms persist.  Follow up in 2 months  45 minutes spent on  this visit  Dorn Chill, MD [facility] Office: [phone]    Current Outpatient Medications:    albuterol  (VENTOLIN  HFA) 108 (90 Base) MCG/ACT inhaler, Inhale 2 puffs into the lungs every 4 hours as needed for wheezing or shortness of breath., Disp: 18 g, Rfl: 3   benzonatate  (TESSALON ) 200 MG capsule, Take 1 capsule (200 mg total) by mouth 3 (three) times daily as needed for cough., Disp: 30 capsule, Rfl: 1   budesonide  (ENTOCORT EC ) 3 MG 24 hr capsule, Take 3 capsules (9 mg total) by mouth in the morning., Disp: 270 capsule, Rfl: 3   budesonide -formoterol  (SYMBICORT ) 160-4.5 MCG/ACT inhaler, Inhale 2 puffs into the lungs 2 (two) times daily., Disp: 1 each, Rfl: 12   buPROPion  (WELLBUTRIN  XL) 150 MG 24 hr tablet, Take 1 tablet (150 mg total) by mouth daily., Disp: 90 tablet, Rfl: 1   cyanocobalamin  (DODEX ) 1000 MCG/ML injection, INJECT 1 ML INTRAMUSCULARLY EVERY MONTH, Disp: 3 mL, Rfl: 11   empagliflozin  (JARDIANCE ) 10 MG TABS tablet, Take 1 tablet (10 mg total) by mouth daily before breakfast., Disp: 90 tablet, Rfl: 3   estradiol  (ESTRACE  VAGINAL) 0.1 MG/GM vaginal cream, Place 1 g vaginally 3 (three) times a week., Disp: 42.5 g, Rfl: 3   estradiol  (VIVELLE -DOT) 0.075 MG/24HR, Place 1 patch onto the skin 2 (two) times a week., Disp: 8 patch, Rfl: 5   furosemide  (LASIX ) 40 MG tablet, Take 1 tablet (40 mg total) by mouth daily., Disp: 90 tablet, Rfl: 3   gabapentin  (NEURONTIN ) 600 MG tablet, TAKE 1 TABLET BY MOUTH 3 TIMES A DAY (Patient taking differently: Take 600 mg by mouth at bedtime.), Disp: 90 tablet, Rfl: 3   HYDROcodone -acetaminophen  (NORCO) 10-325 MG tablet, One tab every 6 hours as needed for pain, Disp: 20 tablet, Rfl: 0  HYRIMOZ  40 MG/0.4ML SOAJ, Inject 40 mg into the skin once a week., Disp: , Rfl:    levothyroxine  (SYNTHROID ) 112 MCG tablet, Take 1 tablet (112 mcg total) by mouth daily., Disp: 90 tablet, Rfl: 3   LORazepam  (ATIVAN ) 1 MG tablet, TAKE  1 TABLET BY MOUTH TWICE A DAY FOR ANXIETY (Patient taking differently: Take 1 mg by mouth daily as needed for sleep or anxiety.), Disp: 60 tablet, Rfl: 2   metoprolol  succinate (TOPROL -XL) 25 MG 24 hr tablet, Take 1.5 tablets (37.5 mg total) by mouth daily., Disp: 45 tablet, Rfl: 11   potassium chloride  (KLOR-CON  M) 10 MEQ tablet, Take 1 tablet (10 mEq total) by mouth daily. Take 1 tablet every other day with lasix  (furosemide ), Disp: 90 tablet, Rfl: 3   promethazine  (PHENERGAN ) 25 MG tablet, Take 1 tablet (25 mg total) by mouth every 8 (eight) hours as needed for nausea and/or vomiting, Disp: 30 tablet, Rfl: 5   promethazine -dextromethorphan (PROMETHAZINE -DM) 6.25-15 MG/5ML syrup, Take 5 mLs by mouth 4 (four) times daily as needed for cough., Disp: , Rfl:    semaglutide -weight management (WEGOVY ) 0.25 MG/0.5ML SOAJ SQ injection, Inject 0.25 mg into the skin once a week., Disp: 2 mL, Rfl: 0   tiZANidine  (ZANAFLEX ) 4 MG tablet, TAKE 1 TABLET BY MOUTH 3 TIMES DAILY. (Patient taking differently: Take 4 mg by mouth at bedtime.), Disp: 90 tablet, Rfl: 3   Ubrogepant  (UBRELVY ) 100 MG TABS, Take 1 tablet (100 mg total) by mouth every 2 (two) hours as needed. Maximum 200mg  a day., Disp: 16 tablet, Rfl: 11   Vitamin D , Ergocalciferol , (DRISDOL ) 1.25 MG (50000 UNIT) CAPS capsule, TAKE 1 CAPSULE BY MOUTH ONE TIME PER WEEK, Disp: 4 capsule, Rfl: 2 No current facility-administered medications for this visit.  Facility-Administered Medications Ordered in Other Visits:    gadobenate dimeglumine  (MULTIHANCE ) injection 20 mL, 20 mL, Intravenous, Once PRN, Ines Onetha NOVAK, MD

## 2023-12-05 NOTE — Patient Instructions (Addendum)
 Continue symbicort  2 puffs twice daily - rinse mouth out after each use  Start oxygen 2L with ambulation  We will titrate you on a POC to try to order you a machine  We will schedule you for an overnight oxygen test on CPAP  I will review the records from Dr. Curt and we will repeat inflammatory labs today  We will schedule you for echo with bubble study  We will refer you to pulmonary rehab  Follow up in 2 months

## 2023-12-06 ENCOUNTER — Other Ambulatory Visit: Payer: Self-pay | Admitting: Internal Medicine

## 2023-12-06 ENCOUNTER — Encounter: Payer: Self-pay | Admitting: Pulmonary Disease

## 2023-12-07 LAB — CYCLIC CITRUL PEPTIDE ANTIBODY, IGG/IGA: Cyclic Citrullin Peptide Ab: 6 U (ref 0–19)

## 2023-12-09 ENCOUNTER — Telehealth: Payer: Self-pay | Admitting: Pulmonary Disease

## 2023-12-09 ENCOUNTER — Other Ambulatory Visit: Payer: Self-pay

## 2023-12-09 ENCOUNTER — Ambulatory Visit: Payer: Self-pay

## 2023-12-09 ENCOUNTER — Telehealth (HOSPITAL_COMMUNITY): Payer: Self-pay

## 2023-12-09 ENCOUNTER — Other Ambulatory Visit: Payer: Self-pay | Admitting: Primary Care

## 2023-12-09 DIAGNOSIS — I5032 Chronic diastolic (congestive) heart failure: Secondary | ICD-10-CM | POA: Diagnosis not present

## 2023-12-09 DIAGNOSIS — J9611 Chronic respiratory failure with hypoxia: Secondary | ICD-10-CM

## 2023-12-09 NOTE — Telephone Encounter (Signed)
 [facility] Victoria [name] spoke with Victoria [name] at [employer], oxygen will be delivered today.  Victoria [name] called and spoke with patient.  Nothing further needed.

## 2023-12-09 NOTE — Telephone Encounter (Signed)
 Per Avelina with [employer] This patient doesn't have O2 with us . She will be a new start which we would need Continuous added to the RX frequency and the sats co signed and if she is already on O2 this will need to be sent to her current O2 DME provider.  Thanks    I spoke with Avelina with [employer]; would need a new order. This patient has no O2

## 2023-12-09 NOTE — Telephone Encounter (Signed)
 New order placed.

## 2023-12-09 NOTE — Telephone Encounter (Signed)
 FYI Only or Action Required?: Action required by provider: Need prescription resent.  Patient is followed in Pulmonology for SOB, last seen on 12/05/2023 by Kara Dorn NOVAK, MD.  Called Nurse Triage reporting Shortness of Breath.   Triage Disposition: Call PCP When Office is Open  Patient/caregiver understands and will follow disposition?: Yes  **See note Below**              Copied from CRM #[id number]. Topic: Clinical - Red Word Triage >> Dec 09, 2023  9:50 AM Rilla NOVAK wrote: [facility] that prompted transfer to Nurse Triage: Shortness of Breath Reason for Disposition  [1] Caller requesting NON-URGENT health information AND [2] PCP's office is the best resource  Answer Assessment - Initial Assessment Questions 1. RESPIRATORY STATUS: Describe your breathing? (e.g., wheezing, shortness of breath, unable to speak, severe coughing)      SOB with exertion  2. ONSET: When did this breathing problem begin?        3. PATTERN Does the difficult breathing come and go, or has it been constant since it started?        4. SEVERITY: How bad is your breathing? (e.g., mild, moderate, severe)        5. RECURRENT SYMPTOM: Have you had difficulty breathing before? If Yes, ask: When was the last time? and What happened that time?        6. CARDIAC HISTORY: Do you have any history of heart disease? (e.g., heart attack, angina, bypass surgery, angioplasty)        7. LUNG HISTORY: Do you have any history of lung disease?  (e.g., pulmonary embolus, asthma, emphysema)       8. CAUSE: What do you think is causing the breathing problem?        9. OTHER SYMPTOMS: Do you have any other symptoms? (e.g., chest pain, cough, dizziness, fever, runny nose)       10. O2 SATURATION MONITOR:  Do you use an oxygen saturation monitor (pulse oximeter) at home? If Yes, ask: What is your reading (oxygen level) today? What is your usual oxygen saturation reading? (e.g.,  95%)   Dr. Kara, Ordered O2 for the patient. Order has not been placed.  Answer Assessment - Initial Assessment Questions 1. REASON FOR CALL: What is the main reason for your call? or How can I best help you?   Dr. Kara, Ordered O2 for the patient during 10/9 visit. Order has not been received by [facility]. She is calling to get that looked into.   [facility] fax: [phone]  Protocols used: Breathing Difficulty-A-AH, Information Only Call - No Triage-A-AH

## 2023-12-09 NOTE — Telephone Encounter (Signed)
 Called patient regarding pulmonary rehab, patient interested but her [employer] coverage will no longer be valid in [place]  starting 1/01. Due to her insurance patient will need to grad by 12/31.  Patient will come in for orientation on 10/31 and will attend the 10:15 exercise class.  Sent MyChart message.

## 2023-12-09 NOTE — Telephone Encounter (Signed)
 I called and spoke with patient, I advised her that according to [employer], [employer] verified on 10/9 that they received the order and she should have already received the oxygen.  I apologized for the delay and let her know that one of our patient care coordinators is going to follow up with the DME company to see what happened.  I let her know that once I hear back from them I will call her back with an update.  She was appreciative and verified understanding.

## 2023-12-09 NOTE — Telephone Encounter (Signed)
 Pt insurance is active and benefits verified through [facility]. Co-pay $0, DED [phone] met, out of pocket $9,200/$9,200 met, co-insurance 50%. No pre-authorization required. 12/09/2023 @ 9:22am, spoke with Gwynneth, REF# [id number].

## 2023-12-09 NOTE — Progress Notes (Incomplete)
 Patient Saturations on Room Air at Rest = 100%  Patient Saturations on [employer] while Ambulating = 82%  Patient Saturations on 2 Liters of oxygen while Ambulating = 100%  Please briefly explain why patient needs home oxygen:Patient could not do a lap O2 dropped to 82% placed on 2-3 L of countious, O2 100%

## 2023-12-10 ENCOUNTER — Telehealth: Payer: Self-pay | Admitting: Primary Care

## 2023-12-10 DIAGNOSIS — K52832 Lymphocytic colitis: Secondary | ICD-10-CM | POA: Diagnosis not present

## 2023-12-10 DIAGNOSIS — K219 Gastro-esophageal reflux disease without esophagitis: Secondary | ICD-10-CM | POA: Diagnosis not present

## 2023-12-10 NOTE — Telephone Encounter (Signed)
 This is  [place] patient, happy to sign if someone wants to put it under my name

## 2023-12-10 NOTE — Telephone Encounter (Signed)
 This is an urgent order. Could you please sign this? Thank you so much!

## 2023-12-10 NOTE — Telephone Encounter (Signed)
 It's been signed. Thank you! NFN

## 2023-12-11 LAB — ANTI-NUCLEAR AB-TITER (ANA TITER)
ANA TITER: 1:40 {titer} — ABNORMAL HIGH
ANA Titer 1: 1:80 {titer} — ABNORMAL HIGH

## 2023-12-11 LAB — ANCA SCREEN W REFLEX TITER: ANCA SCREEN: NEGATIVE

## 2023-12-11 LAB — ANTI-DNA ANTIBODY, DOUBLE-STRANDED: ds DNA Ab: 2 [IU]/mL

## 2023-12-11 LAB — ANTI-SCLERODERMA ANTIBODY: Scleroderma (Scl-70) (ENA) Antibody, IgG: <1 AI

## 2023-12-11 LAB — ANTI-SMITH ANTIBODY: ENA SM Ab Ser-aCnc: <1 AI

## 2023-12-11 LAB — ALDOLASE: Aldolase: 4.3 U/L (ref <=8.1)

## 2023-12-11 LAB — RHEUMATOID FACTOR: Rheumatoid fact SerPl-aCnc: 11 [IU]/mL (ref <14)

## 2023-12-11 LAB — SJOGRENS SYNDROME-B EXTRACTABLE NUCLEAR ANTIBODY: SSB (La) (ENA) Antibody, IgG: <1 AI

## 2023-12-11 LAB — ANA: Anti Nuclear Antibody (ANA): POSITIVE — AB

## 2023-12-11 LAB — SJOGRENS SYNDROME-A EXTRACTABLE NUCLEAR ANTIBODY: SSA (Ro) (ENA) Antibody, IgG: <1 AI

## 2023-12-14 ENCOUNTER — Ambulatory Visit: Payer: Self-pay | Admitting: Pulmonary Disease

## 2023-12-18 ENCOUNTER — Encounter: Payer: Self-pay | Admitting: Internal Medicine

## 2023-12-18 ENCOUNTER — Ambulatory Visit: Admitting: Bariatrics

## 2023-12-18 ENCOUNTER — Other Ambulatory Visit: Payer: Self-pay | Admitting: Nurse Practitioner

## 2023-12-18 ENCOUNTER — Encounter: Payer: Self-pay | Admitting: Bariatrics

## 2023-12-18 VITALS — BP 115/76 | HR 68 | Temp 97.6°F | Ht 66.5 in | Wt 291.0 lb

## 2023-12-18 DIAGNOSIS — Z6841 Body Mass Index (BMI) 40.0 and over, adult: Secondary | ICD-10-CM | POA: Diagnosis not present

## 2023-12-18 DIAGNOSIS — R632 Polyphagia: Secondary | ICD-10-CM

## 2023-12-18 DIAGNOSIS — R058 Other specified cough: Secondary | ICD-10-CM

## 2023-12-18 NOTE — Progress Notes (Signed)
 WEIGHT SUMMARY AND BIOMETRICS  Weight Lost Since Last Visit: 0  Weight Gained Since Last Visit: 2lb   Vitals Temp: 97.6 F (36.4 C) BP: 115/76 Pulse Rate: 68 SpO2: 100 %   Anthropometric Measurements Height: 5' 6.5 (1.689 m) Weight: 291 lb (132 kg) BMI (Calculated): 46.27 Weight at Last Visit: 289lb Weight Lost Since Last Visit: 0 Weight Gained Since Last Visit: 2lb Starting Weight: 308lb Total Weight Loss (lbs): 17 lb (7.711 kg)   Body Composition  Body Fat %: 56.7 % Fat Mass (lbs): 265.2 lbs Muscle Mass (lbs): 119.8 lbs Total Body Water  (lbs): 96.8 lbs Visceral Fat Rating : 21   Other Clinical Data Fasting: no Labs: no Today's Visit #: 4 Starting Date: 10/17/23    OBESITY Victoria [name] is here to discuss her progress with her obesity treatment plan along with follow-up of her obesity related diagnoses.    Nutrition Plan: the Category 3 plan - 0% adherence.  Current exercise: none  Interim History:  She is up 2 lbs since her last visit. She was diagnosed with chronic respiratory failure with hypoxia and placed on oxygen at  2/L/min now and is wearing it throughout the day. She has been ordered an echo with a bubble study this upcoming Monday. She is taking 40 mg of Lasix  daily. Her body water  is up 5 lbs on the bio-impedence scale.  Eating all of the food on the plan., Protein intake is less than prescribed., and Water  intake is adequate. She states that she followed up with her cardiologist but no further intervention was recommended and she has an appointment in 6 months.  She states that she has an appointment with her PCP next month in November .    Pharmacotherapy: Victoria [name] was prescribed GLP-1 but her insurance will not pay.  Hunger is moderately controlled.  Cravings are moderately controlled.  Assessment/Plan:   Victoria [name] endorses  excessive hunger.  Medication(s): none Effects of medication:  moderately controlled. Cravings are moderately controlled.   Plan: Medication(s): none Will increase water , protein and fiber to help assuage hunger.  Will minimize foods that have a high glucose index/load to minimize reactive hypoglycemia.    Morbid Obesity: Current BMI BMI (Calculated): 46.27  Minimal to no salt (1,500 daily).   Will stay at 64 ounces of water .  Will follow-up with the pulmonologist.  Will minimize any processed foods.  Will increase her protein.   Pharmacotherapy Plan We did not discuss any further antiobesity medications at this time as her health needs attention and she needs to focus on just eating a good diet with lean meat, vegetables and healthy low sugar fruits. She will follow-up with her PCP in November and if her signs and symptoms worsen, increasing fluid/edema, not urinating adequately, and/or not feeling well overall, she will contact her PCP.  I told her that I felt like she  should go ahead and call her PCP and discuss her situation and see if her PCP wants to see her sooner after her visits with pulmonology and cardiology.  Also we talked about her canceling her upcoming physical and going in for a problem visit related to her recent issues so that she can be monitored more closely.  Victoria [name] is not currently in the action stage of change due to too many stressors. As such, her goal is to get back to weightloss efforts  in the near future.  She has agreed to the Category 3 plan.  Exercise goals: No exercise has been prescribed at this time.  Behavioral modification strategies: increasing lean protein intake, decreasing simple carbohydrates , no meal skipping, decrease eating out, meal planning , increase water  intake, better snacking choices, planning for success, increasing vegetables, decrease snacking , keep healthy foods in the home, weigh protein portions, and measure portion  sizes.  Victoria [name] has agreed to follow-up with our clinic in 4 weeks.     Objective:   VITALS: Per patient if applicable, see vitals. GENERAL: Alert and in no acute distress. CARDIOPULMONARY: No increased WOB. Speaking in clear sentences.  PSYCH: Pleasant and cooperative. Speech normal rate and rhythm. Affect is appropriate. Insight and judgement are appropriate. Attention is focused, linear, and appropriate.  NEURO: Oriented as arrived to appointment on time with no prompting.   Attestation Statements:    Time spent on visit including pre-visit chart review and post-visit charting and care was 42 minutes.    We discussed her food plan and will simplify it down to lean meat, vegetables, and low sugar fruit. We discussed her fluid balance and ways to address this see above. We had discussed a plan for her to see her primary care to follow-up with her issues concerning her pulmonary and cardiovascular needs. We discussed that we will not begin any antiobesity medications at this time.   This was prepared with the assistance of Engineer, civil (consulting).  Occasional wrong-word or sound-a-like substitutions may have occurred due to the inherent limitations of voice recognition   Clayborne Daring, DO

## 2023-12-23 ENCOUNTER — Ambulatory Visit (HOSPITAL_COMMUNITY)
Admission: RE | Admit: 2023-12-23 | Discharge: 2023-12-23 | Disposition: A | Discharge status: Home / Self Care | Source: Ambulatory Visit | Attending: Pulmonary Disease | Admitting: Pulmonary Disease

## 2023-12-23 DIAGNOSIS — I272 Pulmonary hypertension, unspecified: Secondary | ICD-10-CM | POA: Diagnosis not present

## 2023-12-23 LAB — ECHOCARDIOGRAM COMPLETE BUBBLE STUDY
AR max vel: 0.91 cm2
AV Area VTI: 1.02 cm2
AV Area mean vel: 0.89 cm2
AV Mean grad: 5 mmHg
AV Peak grad: 9.1 mmHg
Ao pk vel: 1.51 m/s
Area-P 1/2: 3.6 cm2
MV M vel: 1.14 m/s
MV Peak grad: 5.2 mmHg
S' Lateral: 2.17 cm

## 2023-12-23 MED ORDER — PERFLUTREN LIPID MICROSPHERE
1.0000 mL | INTRAVENOUS | Status: AC | PRN
  Administered 2023-12-23: 3 mL via INTRAVENOUS

## 2023-12-24 DIAGNOSIS — L732 Hidradenitis suppurativa: Secondary | ICD-10-CM | POA: Diagnosis not present

## 2023-12-24 NOTE — Progress Notes (Signed)
 Patient Care Team: Perri Ronal PARAS, MD as PCP - General Shlomo Wilbert SAUNDERS, MD as PCP - Cardiology (Cardiology) Mealor, Eulas BRAVO, MD as PCP - Electrophysiology (Cardiology) Wonda Cy BROCKS, RD as Dietitian (Family Medicine)  Visit Date: 12/26/23  Subjective:    Patient ID: Victoria [name] , Female   DOB: 01-13-1961, 63 y.o.    MRN: [id number]   63 y.o. Female presents today for discussion of Cardiopulmonary status. She is requiring continuous oxygen therapy. Has been followed by [facility], Drs. Wilbert Shlomo and Peter Jordan. Also seeing Dr. Kara, Pulmonologist.    She is wearing continuous oxygen but still has SOB.   In May of this year, she had BNP of 114 and 3 weeks ago, her BNP was 22. She has a positive ANA.  Chart review shows that  4 months ago, ANA titer was 1:320 and was repeated 3 weeks ago and is  now 1:80. ANCA screen was negative on Oct 9 as was Aldolase. Anti DNA was 2. Scleroderma Antibody was less than 1.0. Anti-Smith antibody was less than 1.0. Anti DNA was 2. Total CK and C reactive protein were both negative.   She says her oxygen saturation decreases  to 82% when she walks around so she can't walk very far without getting short of breath.   Over the past few months, she has been evaluated extensively by Cardiology and Pulmonology.  Her Pulmonologist, Dr. Dorn Kara feels that she has chronic diastolic heart failure, sleep apnea and postcapillary pulmonary hypertension.  Her Cardiologist, Dr. Shlomo, has her on Jardiance  for weight loss and type 2 Diabetes mellitus.  She had cardiac cath by Dr. Peter Jordan on Sept 19 revealing mild pulmonary HTN PAP 42/18 mean 30 mm Hgb. Elevated LV filling pressures PCWP 27/28 mean 23 mm Hg. Normal cardiac output 7.2 l/min. Recommended sodium restriction and take Lasix  every other day.    In October 2024, her Hemoglobin A1c was 6.1%.and was 5.6% in August.   She says she has had shortness of breath since the beginning of 2025.   In April, she presented to the Emergency Department with shortness of breath.  She had an elevated D-dimer of 2.84.  BNP at that time was normal at 177.  She had CT angio of the chest showing bronchomalacia involving right bronchus intermedius and left lower lobar bronchus.  No acute PE.  Had mild bronchial wall thickening consistent with airway inflammation.  History of Palpitations and Non-sustained Atrial Tachycardia treated with Flecainide  50 mg daily, Metoprolol  succinate 25 mg daily, and in Sept was prescribed  Lasix  40 mg daily and is still on this.  BP normal today at 102/80.  History of Anxiety/Depression treated with Lorazepam  1 mg twice daily, Lexapro  10 mg daily, and Wellbutrin -XL 150 mg daily.  She said that she stopped taking Wellbutrin  and Lexapro  and she only takes Lorazepam  when needed.  History of Hyperlipidemia- currently not on statin  Elevated BNP of 114 in May 2025   Remote History of Reactive Hypoglycemia 2017 after Gastric bypass surgery around 2016 at [facility]. Lost from 291 lbs to 212 pounds over a couple of years. 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 Von   Impaired Glucose Tolerance with 07/04/2023 HgbA1c 6.0%, decreased from 6.1% in 11/2022, elevated from 5.7% in 06/2022; Glucose 75. .    S/p Thyroidectomy 1980; Hypothyroidism treated with Levothyroxine  112 mcg. 07/04/2023 TSH: normal at 0.84.   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        History of Iron  Deficiency; Benign Ethnic Neutropenia with 07/04/2023 CBC, compared to 07/02/2022: RBC 3.61, decreased from 3.73; Hgb 11.6, decreased form 12.2; HCT 35.6, decreased from 37.6; otherwise WNL - neutrophils at 62.2.      History of Vitamin-B12 Deficiency treated with B12 1 mL IM monthly.   Vitamin-D Deficiency treated with Vitamin-D 50,000 units weekly.     History of Asthma treated with Albuterol  inhaler as needed.    History of Hidradenitis Suppurativa treated with Humira  40 mg injected weekly. No episodes in several years.   History of Musculoskeletal Pain; Chronic Back Pain treated with Gabapentin  600 mg three times daily and Tizanidine  4 mg three times daily.   S/p Total Right Knee Arthroplasty by Dr. Vernetta 07/13/2022; S/p Total Left Knee Arthroplasty by Dr. Melodi 01/20/2018.    History of Migraine Headaches w/ associated Vertigo treated with Emgality  120 mg monthly and Ubrelvy  100 mg as needed every two hours, maximum 200 mg. Followed by Dr. Onetha Epp. Also uses Phenergan  25 mg as needed for nausea.     12/23/2023 Echocardiogram Left ventricular ejection fraction, by estimation, is 60 to 65% . The left ventricle has normal function. The left ventricle has no regional wall motion abnormalities. Left ventricular diastolic parameters were normal. Right ventricular systolic function is normal. The right ventricular size is normal. There is normal pulmonary artery systolic pressure. The mitral valve is normal in structure. No evidence of mitral valve regurgitation. No evidence of mitral stenosis. The aortic valve is normal in structure. Aortic valve regurgitation is not visualized. No aortic stenosis is present. The inferior vena cava is dilated in size with < 50% respiratory variability, suggesting right atrial pressure of 15 mmHg.   Vaccine counseling: UTD on influenza vaccine, Covid-19 vaccine due.   Past Medical History:  Diagnosis Date   Allergy  approx-5years ago   estradiol ,topical patch only-rash   Anxiety    Back pain    Blood transfusion without reported diagnosis 2010   after hip replacement   Bursitis    right shoulder   Cataract September 2019   bilateral, lasik   Chronic  diastolic heart failure (HCC)    Chronic kidney disease    hematuria, has been worked up, her normal   Chronic leukopenia    intermittant since 2010, followed by Dr. Perri now   Chronic neck pain    Constipation    Degenerative joint disease of low back 09/02/2015   Dr. Hiram   Displacement of lumbar intervertebral disc    Dysrhythmia    Tachycardia, PVCs, PACs   Edema of both lower legs    Fibrocystic breast    Gallbladder problem    GERD (gastroesophageal reflux disease)    Headache    per pt more stress/tension   Heart palpitations    SEE EPIC ENCOUTNER , CARDIOLOGY DR. WILBERT TURNER 2018; reports on 05-16-17  i haven't had any bouts of those lately     Hemorrhoids    Hidradenitis suppurativa    History of Clostridium difficile infection 12/2010   History of exercise intolerance    ETT on 04-27-2016-- negative ([facility] treadmill score 7)   History of Helicobacter pylori infection 2001 and 09/ 2012   HSV-2  infection    genital   Hx of adenomatous colonic polyps 10/17/2007   Hydradenitis    per pt currently treated with Humira  injection   Hypertension    Hyperthyroidism    Hypocupremia    Hypothyroidism    Hypothyroidism, postsurgical 1980   IBS (irritable bowel syndrome)    Insomnia    Iron  deficiency    Joint pain    Leukopenia    Lumbosacral radiculopathy    Lymphocytic colitis 05/14/2022   dx from colonoscopy   Medial meniscus tear    right knee   Mild obstructive sleep apnea    Mitral regurgitation    Mild by echo 06/2023   Numbness and tingling of right arm    Obesity    Osteoarthritis    Palpitations    Pernicious anemia    b12 def   Peroneal neuropathy    PONV (postoperative nausea and vomiting)    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   Prediabetes    Reactive hypoglycemia followed by pcp   post gastrectomy dumping syndrome   SOB (shortness of breath)    Spinal stenosis    Tendonitis    right shoulder   Tricuspid regurgitation    Mild to  moderate echo 06/2023   Varicose veins    Vertigo    Vitamin B 12 deficiency    Vitamin D  deficiency      Family History  Problem Relation Age of Onset   High blood pressure Mother    Hypertension Mother    Headache Mother    High Cholesterol Mother    Thyroid  disease Mother    Obesity Mother    Kidney disease Mother    Arthritis Mother    Hyperlipidemia Mother    Hypertension Father    Diabetes Father    High blood pressure Father    High Cholesterol Father    Heart disease Father    Obesity Father    Vision loss Father    Diabetes Sister    Hypertension Sister    Obesity Sister    Thyroid  disease Brother    Thyroid  disease Maternal Aunt    Hypertension Maternal Grandmother    Stroke Maternal Grandmother    Diabetes Paternal Grandmother    Vision loss Paternal Grandmother    Diabetes Paternal Aunt    Hyperlipidemia Maternal Aunt    Hypertension Maternal Aunt    Obesity Maternal Aunt    Migraines Neg Hx     Social History   Social History Narrative   Lives at home with spouse   Right handed   Caffeine: diet zero mtn dew, occasionally     2025 - Recently retired from [employer] as an [employer], previously worked environmental education officer, now working 1 day/ week. Divorced, remarried - husband is disabled. 2 adult twin daughters. Non-smoker or drinker.       Review of Systems  All other systems reviewed and are negative.       Objective:   Vitals: BP 102/80   Pulse 79   Ht 5' 6.5 (1.689 m)   Wt (!) 301 lb (136.5 kg)   SpO2 97%   BMI 47.85 kg/m    Physical Exam Vitals and nursing note reviewed.  Constitutional:      General: She is not in acute distress.    Appearance: Normal appearance. She is not toxic-appearing.  HENT:     Head: Normocephalic and atraumatic.  Cardiovascular:     Rate and Rhythm: Normal rate and  regular rhythm. No extrasystoles are present.    Pulses: Normal pulses.     Heart sounds: Normal heart sounds. No murmur heard.    No friction  rub. No gallop.  Pulmonary:     Effort: Pulmonary effort is normal. No respiratory distress.     Breath sounds: Normal breath sounds. No wheezing or rales.  Skin:    General: Skin is warm and dry.  Neurological:     Mental Status: She is alert and oriented to person, place, and time. Mental status is at baseline.  Psychiatric:        Mood and Affect: Mood normal.        Behavior: Behavior normal.        Thought Content: Thought content normal.        Judgment: Judgment normal.       Results:   Studies obtained and personally reviewed by me:  Imaging, colonoscopy, mammogram, bone density scan, echocardiogram, heart cath, stress test, CT calcium  score, etc.    Labs:       Component Value Date/Time   NA 143 11/29/2023 0849   K 4.5 11/29/2023 0849   CL 105 11/29/2023 0849   CO2 24 11/29/2023 0849   GLUCOSE 96 11/29/2023 0849   GLUCOSE 104 (H) 09/05/2023 1626   BUN 22 11/29/2023 0849   CREATININE 1.25 (H) 11/29/2023 0849   CREATININE 1.28 (H) 09/05/2023 1626   CALCIUM  9.5 11/29/2023 0849   PROT 7.2 10/17/2023 0923   ALBUMIN 4.2 10/17/2023 0923   AST 17 10/17/2023 0923   AST 26 10/16/2018 0903   ALT 16 10/17/2023 0923   ALT 26 10/16/2018 0903   ALKPHOS 73 10/17/2023 0923   BILITOT 0.4 10/17/2023 0923   BILITOT 0.5 10/16/2018 0903   GFRNONAA 51 (L) 06/12/2023 1810   GFRNONAA 93 06/30/2020 1105   GFRAA 108 06/30/2020 1105     Lab Results  Component Value Date   WBC 5.5 11/12/2023   HGB 10.9 (L) 11/15/2023   HCT 32.0 (L) 11/15/2023   MCV 99.7 11/12/2023   PLT 178 11/12/2023    Lab Results  Component Value Date   CHOL 224 (H) 10/17/2023   HDL 92 10/17/2023   LDLCALC 121 (H) 10/17/2023   TRIG 64 10/17/2023   CHOLHDL 2 07/04/2023    Lab Results  Component Value Date   HGBA1C 5.6 10/17/2023     Lab Results  Component Value Date   TSH 0.10 (L) 12/05/2023        Assessment & Plan:   Meds ordered this encounter  Medications   LORazepam  (ATIVAN ) 1  MG tablet    Sig: Take 1 tablet (1 mg total) by mouth 2 (two) times daily as needed for anxiety.    Dispense:  60 tablet    Refill:  1   Patient is here to discuss her Cardiopulmonary situation. She is wearing continuous oxygen but still has SOB.  In May of this year, she had BNP of 114 and 3 weeks ago, her BNP was 22. She has a positive ANA.    Chart review shows that  4 months ago, ANA titer was 1:320 and was repeated 3 weeks ago and is  now 1:80. ANCA screen was negative on Oct 9 as was Aldolase. Anti DNA was 2. Scleroderma Antibody was less than 1.0. Anti-Smith antibody was less than 1.0. Anti DNA was 2. Total CK and C reactive protein were both negative.   She says her oxygen level goes down  to 82% when she walks around so she can't walk very far without getting short of breath.   Over the past few months he has been evaluated extensively by Cardiology and Pulmonology.  Her Pulmonologist, Dr. Dorn Chill feels that she has chronic diastolic heart failure, sleep apnea and postcapillary pulmonary hypertension.  Her Cardiologist, Dr. Shlomo has her on Jardiance  for weight loss and type 2 diabetes mellitus.  In October 2024, her hemoglobin A1c was 6.1%.  She says she has had shortness of breath since the beginning of the year.  In April she presented to the Emergency Department with shortness of breath.  She had an elevated D-dimer of 2.84.  BNP at that time was normal at 177.  She had CT angio of the chest showing bronchomalacia involving right bronchus intermedius and left lower lobar bronchus.  No acute PE.  Had mild bronchial wall thickening consistent with airway inflammation.  Palpitations; Non-sustained Atrial Tachycardia: treated with Flecainide  50 mg daily, Metoprolol  succinate 25 mg daily, and started on Lasix  40 mg daily x3 days on 5/08 for Hypervolemia w/ Klor-Con  10 meq to prevent potassium deficiency BP normal today at 102/80. Followed by Dr. Wilbert Shlomo,  Cardiologist.  Anxiety/Depression: treated with Lorazepam  1 mg twice daily, Lexapro  10 mg daily, and Wellbutrin -XL 150 mg daily.  She said that she stopped taking Wellbutrin  and Lexapro  and she only takes ativan  when needed.  Ativan  refilled   History of Hyperlipidemia.  History of Reactive Hypoglycemia 2017; Impaired Glucose Tolerance with 07/04/2023 HgbA1c 6.0%, decreased from 6.1% in 11/2022, elevated from 5.7% in 06/2022; Glucose 75. .    S/p Thyroidectomy 1980; Hypothyroidism treated with Levothyroxine  112 mcg. 07/04/2023 TSH: 0.84.    Obesity: Weight today was 301 lb BMI 47.85 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        Iron  Deficiency; Benign Ethnic Neutropenia: with 07/04/2023 CBC, compared to 07/02/2022: RBC 3.61, decreased from 3.73; Hgb 11.6, decreased form 12.2; HCT 35.6, decreased from 37.6; otherwise WNL - neutrophils at 62.2.     Vitamin-B12: Deficiency treated with Dodex  1 mL IM monthly. Vitamin-D Deficiency treated with Vitamin-D 50,000 units weekly.     Asthma: treated with Albuterol  inhaler as needed.    Hidradenitis Suppurativa: treated with Humira  40 mg injected weekly. No episodes in several years.   Musculoskeletal Pain; Chronic Back Pain: treated with Gabapentin  600 mg three times daily and Tizanidine  4 mg three times daily. S/p Total Right Knee Arthroplasty by Dr. Vernetta 07/13/2022; S/p Total Left Knee Arthroplasty by Dr. Melodi 01/20/2018.    Migraine Headaches w/ associated Vertigo treated with Emgality  120 mg monthly and Ubrelvy  100 mg as needed every two hours, maximum 200 mg. Followed by Dr. Onetha Epp. Also uses Phenergan  25 mg as needed for nausea.     12/23/2023 Echocardiogram Left ventricular ejection fraction, by estimation, is 60 to 65% . The left ventricle has normal function. The left ventricle has no regional wall motion abnormalities. Left ventricular diastolic parameters were normal. Right ventricular systolic function  is normal. The right ventricular size is normal. There is normal pulmonary artery systolic pressure. The mitral valve is normal in structure. No evidence of mitral valve regurgitation. No evidence of mitral stenosis. The aortic valve is normal in structure. Aortic valve regurgitation is not visualized. No aortic stenosis is present. The inferior vena cava is dilated in size with < 50% respiratory variability, suggesting right atrial pressure of 15 mmHg.   Vaccine counseling: UTD on influenza  vaccine, Covid-19 vaccine due.   Plan: Patient and I have had discussion today. She is frustrated and feels she does not have adequate answers as to what is going on from a Cardiovascular standpoint. Does not feel she has gotten better.Has fatigue, SOB, and LE edema. Would like evaluation at a major medical center and is agreeable to being [facility] at [facility].   I,Makayla C Reid,acting as a scribe for Ronal JINNY Hailstone, MD.,have documented all relevant documentation on the behalf of Ronal JINNY Hailstone, MD,as directed by  Ronal JINNY Hailstone, MD while in the presence of Ronal JINNY Hailstone, MD.   I, Ronal JINNY Hailstone, MD, have reviewed all documentation for this visit. The documentation on 12/26/2023 for the exam, diagnosis, procedures, and orders are all accurate and complete.

## 2023-12-26 ENCOUNTER — Ambulatory Visit: Admitting: Internal Medicine

## 2023-12-26 ENCOUNTER — Telehealth (HOSPITAL_COMMUNITY): Payer: Self-pay

## 2023-12-26 ENCOUNTER — Encounter: Payer: Self-pay | Admitting: Internal Medicine

## 2023-12-26 VITALS — BP 102/80 | HR 79 | Ht 66.5 in | Wt 301.0 lb

## 2023-12-26 DIAGNOSIS — M549 Dorsalgia, unspecified: Secondary | ICD-10-CM

## 2023-12-26 DIAGNOSIS — E538 Deficiency of other specified B group vitamins: Secondary | ICD-10-CM

## 2023-12-26 DIAGNOSIS — R7989 Other specified abnormal findings of blood chemistry: Secondary | ICD-10-CM

## 2023-12-26 DIAGNOSIS — Z9889 Other specified postprocedural states: Secondary | ICD-10-CM

## 2023-12-26 DIAGNOSIS — Z67A1 Duffy null: Secondary | ICD-10-CM

## 2023-12-26 DIAGNOSIS — E7800 Pure hypercholesterolemia, unspecified: Secondary | ICD-10-CM

## 2023-12-26 DIAGNOSIS — R000 Tachycardia, unspecified: Secondary | ICD-10-CM | POA: Diagnosis not present

## 2023-12-26 DIAGNOSIS — R7302 Impaired glucose tolerance (oral): Secondary | ICD-10-CM

## 2023-12-26 DIAGNOSIS — J45909 Unspecified asthma, uncomplicated: Secondary | ICD-10-CM | POA: Diagnosis not present

## 2023-12-26 DIAGNOSIS — J9809 Other diseases of bronchus, not elsewhere classified: Secondary | ICD-10-CM

## 2023-12-26 DIAGNOSIS — E669 Obesity, unspecified: Secondary | ICD-10-CM | POA: Diagnosis not present

## 2023-12-26 DIAGNOSIS — G43909 Migraine, unspecified, not intractable, without status migrainosus: Secondary | ICD-10-CM | POA: Diagnosis not present

## 2023-12-26 DIAGNOSIS — R0602 Shortness of breath: Secondary | ICD-10-CM

## 2023-12-26 DIAGNOSIS — L732 Hidradenitis suppurativa: Secondary | ICD-10-CM | POA: Diagnosis not present

## 2023-12-26 DIAGNOSIS — Z6841 Body Mass Index (BMI) 40.0 and over, adult: Secondary | ICD-10-CM

## 2023-12-26 DIAGNOSIS — Z9049 Acquired absence of other specified parts of digestive tract: Secondary | ICD-10-CM

## 2023-12-26 DIAGNOSIS — E559 Vitamin D deficiency, unspecified: Secondary | ICD-10-CM

## 2023-12-26 DIAGNOSIS — E661 Drug-induced obesity: Secondary | ICD-10-CM | POA: Diagnosis not present

## 2023-12-26 DIAGNOSIS — Z8639 Personal history of other endocrine, nutritional and metabolic disease: Secondary | ICD-10-CM

## 2023-12-26 DIAGNOSIS — G4733 Obstructive sleep apnea (adult) (pediatric): Secondary | ICD-10-CM

## 2023-12-26 DIAGNOSIS — E039 Hypothyroidism, unspecified: Secondary | ICD-10-CM

## 2023-12-26 DIAGNOSIS — I5032 Chronic diastolic (congestive) heart failure: Secondary | ICD-10-CM

## 2023-12-26 DIAGNOSIS — N1831 Chronic kidney disease, stage 3a: Secondary | ICD-10-CM

## 2023-12-26 DIAGNOSIS — F418 Other specified anxiety disorders: Secondary | ICD-10-CM

## 2023-12-26 DIAGNOSIS — Z9884 Bariatric surgery status: Secondary | ICD-10-CM

## 2023-12-26 DIAGNOSIS — I270 Primary pulmonary hypertension: Secondary | ICD-10-CM

## 2023-12-26 DIAGNOSIS — R7689 Other specified abnormal immunological findings in serum: Secondary | ICD-10-CM

## 2023-12-26 DIAGNOSIS — R5383 Other fatigue: Secondary | ICD-10-CM

## 2023-12-26 DIAGNOSIS — R0600 Dyspnea, unspecified: Secondary | ICD-10-CM

## 2023-12-26 DIAGNOSIS — R5382 Chronic fatigue, unspecified: Secondary | ICD-10-CM

## 2023-12-26 MED ORDER — LORAZEPAM 1 MG PO TABS: 1.0000 mg | ORAL_TABLET | Freq: Two times a day (BID) | ORAL | 1 refills | Status: DC | PRN

## 2023-12-26 NOTE — Telephone Encounter (Signed)
 Called to confirm appt. Pt confirmed appt. Instructed pt on proper footwear. Gave directions along with department number.

## 2023-12-27 ENCOUNTER — Encounter (HOSPITAL_COMMUNITY)
Admission: RE | Admit: 2023-12-27 | Discharge: 2023-12-27 | Disposition: A | Discharge status: Home / Self Care | Source: Ambulatory Visit | Attending: Pulmonary Disease | Admitting: Pulmonary Disease

## 2023-12-27 ENCOUNTER — Encounter (HOSPITAL_COMMUNITY): Payer: Self-pay

## 2023-12-27 ENCOUNTER — Encounter: Payer: Self-pay | Admitting: Internal Medicine

## 2023-12-27 VITALS — BP 112/64 | HR 63 | Ht 68.0 in | Wt 302.5 lb

## 2023-12-27 DIAGNOSIS — I5032 Chronic diastolic (congestive) heart failure: Secondary | ICD-10-CM | POA: Diagnosis not present

## 2023-12-27 NOTE — Progress Notes (Signed)
 Pulmonary Individual Treatment Plan  Patient Details  Name: Victoria [name] MRN: [id number] Date of Birth: 1960/05/12 Referring Provider:   Conrad Ports Pulmonary Rehab Walk Test from 12/27/2023 in [facility]  Referring Provider Dewald    Initial Encounter Date:  Flowsheet Row Pulmonary Rehab Walk Test from 12/27/2023 in [facility]  Date 12/27/23    Visit Diagnosis: Heart failure, diastolic, chronic (HCC)  Patient's Home Medications on Admission:   Current Outpatient Medications:    albuterol  (VENTOLIN  HFA) 108 (90 Base) MCG/ACT inhaler, Inhale 2 puffs into the lungs every 4 hours as needed for wheezing or shortness of breath., Disp: 18 g, Rfl: 3   benzonatate  (TESSALON ) 200 MG capsule, Take 1 capsule (200 mg total) by mouth 3 (three) times daily as needed for cough., Disp: 30 capsule, Rfl: 1   budesonide  (ENTOCORT EC ) 3 MG 24 hr capsule, Take 3 capsules (9 mg total) by mouth in the morning., Disp: 270 capsule, Rfl: 3   budesonide -formoterol  (SYMBICORT ) 160-4.5 MCG/ACT inhaler, Inhale 2 puffs into the lungs 2 (two) times daily., Disp: 1 each, Rfl: 12   empagliflozin  (JARDIANCE ) 10 MG TABS tablet, Take 1 tablet (10 mg total) by mouth daily before breakfast., Disp: 90 tablet, Rfl: 3   estradiol  (VIVELLE -DOT) 0.075 MG/24HR, Place 1 patch onto the skin 2 (two) times a week., Disp: 8 patch, Rfl: 5   furosemide  (LASIX ) 40 MG tablet, Take 1 tablet (40 mg total) by mouth daily., Disp: 90 tablet, Rfl: 3   gabapentin  (NEURONTIN ) 600 MG tablet, TAKE 1 TABLET BY MOUTH 3 TIMES A DAY (Patient taking differently: Take 600 mg by mouth at bedtime.), Disp: 90 tablet, Rfl: 3   HYDROcodone -acetaminophen  (NORCO) 10-325 MG tablet, One tab every 6 hours as needed for pain, Disp: 20 tablet, Rfl: 0   HYRIMOZ  40 MG/0.4ML SOAJ, Inject 40 mg into the skin once a week., Disp: , Rfl:    levothyroxine   (SYNTHROID ) 112 MCG tablet, Take 1 tablet (112 mcg total) by mouth daily., Disp: 90 tablet, Rfl: 3   LORazepam  (ATIVAN ) 1 MG tablet, Take 1 tablet (1 mg total) by mouth 2 (two) times daily as needed for anxiety., Disp: 60 tablet, Rfl: 1   metoprolol  succinate (TOPROL -XL) 25 MG 24 hr tablet, Take 1.5 tablets (37.5 mg total) by mouth daily., Disp: 45 tablet, Rfl: 11   potassium chloride  (KLOR-CON  M) 10 MEQ tablet, Take 1 tablet (10 mEq total) by mouth daily. Take 1 tablet every other day with lasix  (furosemide ), Disp: 90 tablet, Rfl: 3   promethazine  (PHENERGAN ) 25 MG tablet, Take 1 tablet (25 mg total) by mouth every 8 (eight) hours as needed for nausea and/or vomiting, Disp: 30 tablet, Rfl: 5   promethazine -dextromethorphan (PROMETHAZINE -DM) 6.25-15 MG/5ML syrup, Take 5 mLs by mouth 4 (four) times daily as needed for cough., Disp: , Rfl:    tiZANidine  (ZANAFLEX ) 4 MG tablet, TAKE 1 TABLET BY MOUTH 3 TIMES DAILY., Disp: 90 tablet, Rfl: 3   Ubrogepant  (UBRELVY ) 100 MG TABS, Take 1 tablet (100 mg total) by mouth every 2 (two) hours as needed. Maximum 200mg  a day., Disp: 16 tablet, Rfl: 11   Vitamin D , Ergocalciferol , (DRISDOL ) 1.25 MG (50000 UNIT) CAPS capsule, TAKE 1 CAPSULE BY MOUTH ONE TIME PER WEEK, Disp: 4 capsule, Rfl: 2   buPROPion  (WELLBUTRIN  XL) 150 MG 24 hr tablet, Take 1 tablet (150 mg total) by mouth daily. (Patient not  taking: Reported on 12/27/2023), Disp: 90 tablet, Rfl: 1   escitalopram  (LEXAPRO ) 10 MG tablet, Take 10 mg by mouth daily. (Patient not taking: Reported on 12/27/2023), Disp: , Rfl:    estradiol  (ESTRACE  VAGINAL) 0.1 MG/GM vaginal cream, Place 1 g vaginally 3 (three) times a week. (Patient not taking: Reported on 12/27/2023), Disp: 42.5 g, Rfl: 3   flecainide  (TAMBOCOR ) 50 MG tablet, Take 50 mg by mouth every morning. (Patient not taking: Reported on 12/27/2023), Disp: , Rfl:  No current facility-administered medications for this encounter.  Facility-Administered Medications  Ordered in Other Encounters:    gadobenate dimeglumine  (MULTIHANCE ) injection 20 mL, 20 mL, Intravenous, Once PRN, Ines Onetha NOVAK, MD  Past Medical History: Past Medical History:  Diagnosis Date   Allergy  approx-5years ago   estradiol ,topical patch only-rash   Anxiety    Back pain    Blood transfusion without reported diagnosis 2010   after hip replacement   Bursitis    right shoulder   Cataract September 2019   bilateral, lasik   Chronic diastolic heart failure (HCC)    Chronic kidney disease    hematuria, has been worked up, her normal   Chronic leukopenia    intermittant since 2010, followed by Dr. Perri now   Chronic neck pain    Constipation    Degenerative joint disease of low back 09/02/2015   Dr. Hiram   Displacement of lumbar intervertebral disc    Dysrhythmia    Tachycardia, PVCs, PACs   Edema of both lower legs    Fibrocystic breast    Gallbladder problem    GERD (gastroesophageal reflux disease)    Headache    per pt more stress/tension   Heart palpitations    SEE EPIC ENCOUTNER , CARDIOLOGY DR. WILBERT TURNER 2018; reports on 05-16-17  i haven't had any bouts of those lately     Hemorrhoids    Hidradenitis suppurativa    History of Clostridium difficile infection 12/2010   History of exercise intolerance    ETT on 04-27-2016-- negative ([facility] treadmill score 7)   History of Helicobacter pylori infection 2001 and 09/ 2012   HSV-2 infection    genital   Hx of adenomatous colonic polyps 10/17/2007   Hydradenitis    per pt currently treated with Humira  injection   Hypertension    Hyperthyroidism    Hypocupremia    Hypothyroidism    Hypothyroidism, postsurgical 1980   IBS (irritable bowel syndrome)    Insomnia    Iron  deficiency    Joint pain    Leukopenia    Lumbosacral radiculopathy    Lymphocytic colitis 05/14/2022   dx from colonoscopy   Medial meniscus tear    right knee   Mild obstructive sleep apnea    Mitral regurgitation    Mild by  echo 06/2023   Numbness and tingling of right arm    Obesity    Osteoarthritis    Palpitations    Pernicious anemia    b12 def   Peroneal neuropathy    PONV (postoperative nausea and vomiting)    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   Prediabetes    Reactive hypoglycemia followed by pcp   post gastrectomy dumping syndrome   SOB (shortness of breath)    Spinal stenosis    Tendonitis    right shoulder   Tricuspid regurgitation    Mild to moderate echo 06/2023   Varicose veins    Vertigo    Vitamin B 12 deficiency  Vitamin D  deficiency     Tobacco Use: Social History   Tobacco Use  Smoking Status Never  Smokeless Tobacco Never    Labs: Review Flowsheet  More data exists      Latest Ref Rng & Units 07/05/2022 12/20/2022 07/04/2023 10/17/2023 11/15/2023  Labs for ITP Cardiac and Pulmonary Rehab  Cholestrol 100 - 199 mg/dL 802  796  779  775  -  LDL (calc) 0 - 99 mg/dL 99  893  883  878  -  HDL-C >39 mg/dL 86  83  10.59  92  -  Trlycerides 0 - 149 mg/dL 41  62  26.9  64  -  Hemoglobin A1c 4.8 - 5.6 % 5.7  6.1  6.0  5.6  -  Bicarbonate 20.0 - 28.0 mmol/L - - - - 24.0  23.9   TCO2 22 - 32 mmol/L - - - - 25  25   Acid-base deficit 0.0 - 2.0 mmol/L - - - - 1.0  1.0   O2 Saturation % - - - - 75  76     Details       Multiple values from one day are sorted in reverse-chronological order         Capillary Blood Glucose: Lab Results  Component Value Date   GLUCAP 251 (H) 07/16/2022   GLUCAP 109 (H) 07/02/2022     Pulmonary Assessment Scores:  Pulmonary Assessment Scores     Row Name 12/27/23 1128         ADL UCSD   ADL Phase Entry     SOB Score total 90       CAT Score   CAT Score 26       mMRC Score   mMRC Score 4       UCSD: Self-administered rating of dyspnea associated with activities of daily living (ADLs) 6-point scale (0 = not at all to 5 = maximal or unable to do because of breathlessness)  Scoring Scores range from 0 to 120.   Minimally important difference is 5 units  CAT: CAT can identify the health impairment of COPD patients and is better correlated with disease progression.  CAT has a scoring range of zero to 40. The CAT score is classified into four groups of low (less than 10), medium (10 - 20), high (21-30) and very high (31-40) based on the impact level of disease on health status. A CAT score over 10 suggests significant symptoms.  A worsening CAT score could be explained by an exacerbation, poor medication adherence, poor inhaler technique, or progression of COPD or comorbid conditions.  CAT MCID is 2 points  mMRC: mMRC (Modified Medical Research Council) Dyspnea Scale is used to assess the degree of baseline functional disability in patients of respiratory disease due to dyspnea. No minimal important difference is established. A decrease in score of 1 point or greater is considered a positive change.   Pulmonary Function Assessment:  Pulmonary Function Assessment - 12/27/23 1212       Breath   Bilateral Breath Sounds Clear    Shortness of Breath Yes;Fear of Shortness of Breath;Limiting activity          Exercise Target Goals: Exercise Program Goal: Individual exercise prescription set using results from initial 6 min walk test and THRR while considering  patient's activity barriers and safety.   Exercise Prescription Goal: Initial exercise prescription builds to 30-45 minutes a day of aerobic activity, 2-3 days per week.  Home exercise guidelines  will be given to patient during program as part of exercise prescription that the participant will acknowledge.  Activity Barriers & Risk Stratification:  Activity Barriers & Cardiac Risk Stratification - 12/27/23 1117       Activity Barriers & Cardiac Risk Stratification   Activity Barriers Deconditioning;Muscular Weakness;Shortness of Breath;Left Knee Replacement;Right Knee Replacement;Back Problems          6 Minute Walk:  6 Minute Walk      Row Name 12/27/23 1241         6 Minute Walk   Phase Initial     Distance 645 feet     Walk Time 6 minutes     # of Rest Breaks 1  1:16-2:20     MPH 1.22     METS 0.81     RPE 15     Perceived Dyspnea  2     VO2 Peak 2.85     Symptoms No     Resting HR 62 bpm     Resting BP 112/62     Resting Oxygen Saturation  98 %     Exercise Oxygen Saturation  during 6 min walk 86 %     Max Ex. HR 64 bpm     Max Ex. BP 126/86     2 Minute Post BP 111/84  automatic       Interval HR   1 Minute HR 78     2 Minute HR 75     3 Minute HR 71     4 Minute HR 78     5 Minute HR 74     6 Minute HR 72     2 Minute Post HR 64     Interval Heart Rate? Yes       Interval Oxygen   Interval Oxygen? Yes     Baseline Oxygen Saturation % 98 %     1 Minute Oxygen Saturation % 93 %     1 Minute Liters of Oxygen 0 L     2 Minute Oxygen Saturation % 96 %     2 Minute Liters of Oxygen 0 L     3 Minute Oxygen Saturation % 92 %     3 Minute Liters of Oxygen 0 L     4 Minute Oxygen Saturation % 86 %     4 Minute Liters of Oxygen 0 L  placed on 2L     5 Minute Oxygen Saturation % 98 %     5 Minute Liters of Oxygen 2 L     6 Minute Oxygen Saturation % 93 %     6 Minute Liters of Oxygen 2 L     2 Minute Post Oxygen Saturation % 100 %     2 Minute Post Liters of Oxygen 2 L        Oxygen Initial Assessment:  Oxygen Initial Assessment - 12/27/23 1119       Home Oxygen   Home Oxygen Device Home Concentrator;Portable Concentrator    Sleep Oxygen Prescription CPAP    Liters per minute 0    Home Exercise Oxygen Prescription Continuous    Liters per minute 2    Home Resting Oxygen Prescription None    Compliance with Home Oxygen Use Yes      Intervention   Short Term Goals To learn and exhibit compliance with exercise, home and travel O2 prescription;To learn and understand importance of maintaining oxygen saturations>88%;To learn and demonstrate proper use of  respiratory medications;To learn  and demonstrate proper pursed lip breathing techniques or other breathing techniques. ;To learn and understand importance of monitoring SPO2 with pulse oximeter and demonstrate accurate use of the pulse oximeter.    Long  Term Goals Maintenance of O2 saturations>88%;Exhibits compliance with exercise, home  and travel O2 prescription;Exhibits proper breathing techniques, such as pursed lip breathing or other method taught during program session;Verbalizes importance of monitoring SPO2 with pulse oximeter and return demonstration;Compliance with respiratory medication;Demonstrates proper use of MDI's          Oxygen Re-Evaluation:  Oxygen Re-Evaluation     Row Name 12/27/23 1119             Program Oxygen Prescription   Program Oxygen Prescription Continuous       Liters per minute 2          Oxygen Discharge (Final Oxygen Re-Evaluation):  Oxygen Re-Evaluation - 12/27/23 1119       Program Oxygen Prescription   Program Oxygen Prescription Continuous    Liters per minute 2          Initial Exercise Prescription:  Initial Exercise Prescription - 12/27/23 1200       Date of Initial Exercise RX and Referring Provider   Date 12/27/23    Referring Provider Dewald    Expected Discharge Date 03/31/24      Oxygen   Oxygen Continuous    Liters 2    Maintain Oxygen Saturation 88% or higher      NuStep   Level 1    SPM 55    Minutes 30    METs 1.4      Prescription Details   Frequency (times per week) 2    Duration Progress to 30 minutes of continuous aerobic without signs/symptoms of physical distress      Intensity   THRR 40-80% of Max Heartrate 63-126    Ratings of Perceived Exertion 11-13    Perceived Dyspnea 0-4      Progression   Progression Continue progressive overload as per policy without signs/symptoms or physical distress.      Resistance Training   Training Prescription Yes    Weight red bands    Reps 10-15          Perform Capillary Blood  Glucose checks as needed.  Exercise Prescription Changes:   Exercise Comments:   Exercise Goals and Review:   Exercise Goals     Row Name 12/27/23 1119             Exercise Goals   Increase Physical Activity Yes       Intervention Provide advice, education, support and counseling about physical activity/exercise needs.;Develop an individualized exercise prescription for aerobic and resistive training based on initial evaluation findings, risk stratification, comorbidities and participant's personal goals.       Expected Outcomes Short Term: Attend rehab on a regular basis to increase amount of physical activity.;Long Term: Exercising regularly at least 3-5 days a week.;Long Term: Add in home exercise to make exercise part of routine and to increase amount of physical activity.       Increase Strength and Stamina Yes       Intervention Provide advice, education, support and counseling about physical activity/exercise needs.;Develop an individualized exercise prescription for aerobic and resistive training based on initial evaluation findings, risk stratification, comorbidities and participant's personal goals.       Expected Outcomes Short Term: Increase workloads from initial exercise prescription for resistance, speed, and  METs.;Short Term: Perform resistance training exercises routinely during rehab and add in resistance training at home;Long Term: Improve cardiorespiratory fitness, muscular endurance and strength as measured by increased METs and functional capacity ( )       Able to understand and use rate of perceived exertion (RPE) scale Yes       Intervention Provide education and explanation on how to use RPE scale       Expected Outcomes Short Term: Able to use RPE daily in rehab to express subjective intensity level;Long Term:  Able to use RPE to guide intensity level when exercising independently       Able to understand and use Dyspnea scale Yes       Intervention Provide  education and explanation on how to use Dyspnea scale       Expected Outcomes Short Term: Able to use Dyspnea scale daily in rehab to express subjective sense of shortness of breath during exertion;Long Term: Able to use Dyspnea scale to guide intensity level when exercising independently       Knowledge and understanding of Target Heart Rate Range (THRR) Yes       Intervention Provide education and explanation of THRR including how the numbers were predicted and where they are located for reference       Expected Outcomes Short Term: Able to state/look up THRR;Short Term: Able to use daily as guideline for intensity in rehab;Long Term: Able to use THRR to govern intensity when exercising independently       Understanding of Exercise Prescription Yes       Intervention Provide education, explanation, and written materials on patient's individual exercise prescription       Expected Outcomes Short Term: Able to explain program exercise prescription;Long Term: Able to explain home exercise prescription to exercise independently          Exercise Goals Re-Evaluation :   Discharge Exercise Prescription (Final Exercise Prescription Changes):   Nutrition:  Target Goals: Understanding of nutrition guidelines, daily intake of sodium 1500mg , cholesterol 200mg , calories 30% from fat and 7% or less from saturated fats, daily to have 5 or more servings of fruits and vegetables.  Biometrics:    Nutrition Therapy Plan and Nutrition Goals:   Nutrition Assessments:  MEDIFICTS Score Key: >=70 Need to make dietary changes  40-70 Heart Healthy Diet <= 40 Therapeutic Level Cholesterol Diet   Picture Your Plate Scores: <59 Unhealthy dietary pattern with much room for improvement. 41-50 Dietary pattern unlikely to meet recommendations for good health and room for improvement. 51-60 More healthful dietary pattern, with some room for improvement.  >60 Healthy dietary pattern, although there may be  some specific behaviors that could be improved.    Nutrition Goals Re-Evaluation:   Nutrition Goals Discharge (Final Nutrition Goals Re-Evaluation):   Psychosocial: Target Goals: Acknowledge presence or absence of significant depression and/or stress, maximize coping skills, provide positive support system. Participant is able to verbalize types and ability to use techniques and skills needed for reducing stress and depression.  Initial Review & Psychosocial Screening:  Initial Psych Review & Screening - 12/27/23 1121       Initial Review   Current issues with Current Stress Concerns    Source of Stress Concerns Chronic Illness    Comments Pt down about chronic shortness of breath that limits her activity      Family Dynamics   Good Support System? Yes    Comments States she has good support from husband and family  Barriers   Psychosocial barriers to participate in program Psychosocial barriers identified (see note)      Screening Interventions   Interventions Encouraged to exercise;Provide feedback about the scores to participant    Expected Outcomes Long Term Goal: Stressors or current issues are controlled or eliminated.;Long Term goal: The participant improves quality of Life and PHQ9 Scores as seen by post scores and/or verbalization of changes;Short Term goal: Identification and review with participant of any Quality of Life or Depression concerns found by scoring the questionnaire.;Short Term goal: Utilizing psychosocial counselor, staff and physician to assist with identification of specific Stressors or current issues interfering with healing process. Setting desired goal for each stressor or current issue identified.          Quality of Life Scores:  Scores of 19 and below usually indicate a poorer quality of life in these areas.  A difference of  2-3 points is a clinically meaningful difference.  A difference of 2-3 points in the total score of the Quality of  Life Index has been associated with significant improvement in overall quality of life, self-image, physical symptoms, and general health in studies assessing change in quality of life.  PHQ-9: Review Flowsheet  More data exists      12/27/2023 12/26/2023 07/08/2023 02/12/2023 12/04/2022  Depression screen PHQ 2/9  Decreased Interest 2 1 2 3 3   Down, Depressed, Hopeless 1 1 1 2 3   PHQ - 2 Score 3 2 3 5 6   Altered sleeping 2 3 3 3 3   Tired, decreased energy 3 3 3 3 3   Change in appetite 2 2 3 3 3   Feeling bad or failure about yourself  0 0 0 2 2  Trouble concentrating 0 1 0 3 0  Moving slowly or fidgety/restless 0 1 3 3 3   Suicidal thoughts 0 0 0 0 0  PHQ-9 Score 10 12 15 22 20   Difficult doing work/chores Somewhat difficult Somewhat difficult Very difficult - Not difficult at all   Interpretation of Total Score  Total Score Depression Severity:  1-4 = Minimal depression, 5-9 = Mild depression, 10-14 = Moderate depression, 15-19 = Moderately severe depression, 20-27 = Severe depression   Psychosocial Evaluation and Intervention:  Psychosocial Evaluation - 12/27/23 1122       Psychosocial Evaluation & Interventions   Interventions Encouraged to exercise with the program and follow exercise prescription    Comments Guida states she is stressed about her health and is hot that cardiology did not escalute her care and concerns sooner    Expected Outcomes For Reeya to participate in Pulm Rehab with decreased health related stress and SOB    Continue Psychosocial Services  Follow up required by staff          Psychosocial Re-Evaluation:   Psychosocial Discharge (Final Psychosocial Re-Evaluation):   Education: Education Goals: Education classes will be provided on a weekly basis, covering required topics. Participant will state understanding/return demonstration of topics presented.  Learning Barriers/Preferences:  Learning Barriers/Preferences - 12/27/23 1125        Learning Barriers/Preferences   Learning Barriers None    Learning Preferences None          Education Topics: Know Your Numbers Group instruction that is supported by a PowerPoint presentation. Instructor discusses importance of knowing and understanding resting, exercise, and post-exercise oxygen saturation, heart rate, and blood pressure. Oxygen saturation, heart rate, blood pressure, rating of perceived exertion, and dyspnea are reviewed along with a normal range for these  values.    Exercise for the Pulmonary Patient Group instruction that is supported by a PowerPoint presentation. Instructor discusses benefits of exercise, core components of exercise, frequency, duration, and intensity of an exercise routine, importance of utilizing pulse oximetry during exercise, safety while exercising, and options of places to exercise outside of rehab.    MET Level  Group instruction provided by PowerPoint, verbal discussion, and written material to support subject matter. Instructor reviews what METs are and how to increase METs.    Pulmonary Medications Verbally interactive group education provided by instructor with focus on inhaled medications and proper administration.   Anatomy and Physiology of the Respiratory System Group instruction provided by PowerPoint, verbal discussion, and written material to support subject matter. Instructor reviews respiratory cycle and anatomical components of the respiratory system and their functions. Instructor also reviews differences in obstructive and restrictive respiratory diseases with examples of each.    Oxygen Safety Group instruction provided by PowerPoint, verbal discussion, and written material to support subject matter. There is an overview of "What is Oxygen" and "Why do we need it".  Instructor also reviews how to create a safe environment for oxygen use, the importance of using oxygen as prescribed, and the risks of noncompliance. There is  a brief discussion on traveling with oxygen and resources the patient may utilize.   Oxygen Use Group instruction provided by PowerPoint, verbal discussion, and written material to discuss how supplemental oxygen is prescribed and different types of oxygen supply systems. Resources for more information are provided.    Breathing Techniques Group instruction that is supported by demonstration and informational handouts. Instructor discusses the benefits of pursed lip and diaphragmatic breathing and detailed demonstration on how to perform both.     Risk Factor Reduction Group instruction that is supported by a PowerPoint presentation. Instructor discusses the definition of a risk factor, different risk factors for pulmonary disease, and how the heart and lungs work together.   Pulmonary Diseases Group instruction provided by PowerPoint, verbal discussion, and written material to support subject matter. Instructor gives an overview of the different type of pulmonary diseases. There is also a discussion on risk factors and symptoms as well as ways to manage the diseases.   Stress and [employer] Conservation Group instruction provided by PowerPoint, verbal discussion, and written material to support subject matter. Instructor gives an overview of stress and the impact it can have on the body. Instructor also reviews ways to reduce stress. There is also a discussion on energy conservation and ways to conserve energy throughout the day.   Warning Signs and Symptoms Group instruction provided by PowerPoint, verbal discussion, and written material to support subject matter. Instructor reviews warning signs and symptoms of stroke, heart attack, cold and flu. Instructor also reviews ways to prevent the spread of infection.   Other Education Group or individual verbal, written, or video instructions that support the educational goals of the pulmonary rehab program.    Knowledge Questionnaire Score:   Knowledge Questionnaire Score - 12/27/23 1214       Knowledge Questionnaire Score   Pre Score 17/18          Core Components/Risk Factors/Patient Goals at Admission:  Personal Goals and Risk Factors at Admission - 12/27/23 1125       Core Components/Risk Factors/Patient Goals on Admission   Improve shortness of breath with ADL's Yes    Intervention Provide education, individualized exercise plan and daily activity instruction to help decrease symptoms of SOB with activities  of daily living.    Expected Outcomes Short Term: Improve cardiorespiratory fitness to achieve a reduction of symptoms when performing ADLs;Long Term: Be able to perform more ADLs without symptoms or delay the onset of symptoms    Heart Failure Yes    Intervention Provide a combined exercise and nutrition program that is supplemented with education, support and counseling about heart failure. Directed toward relieving symptoms such as shortness of breath, decreased exercise tolerance, and extremity edema.    Expected Outcomes Improve functional capacity of life;Short term: Attendance in program 2-3 days a week with increased exercise capacity. Reported lower sodium intake. Reported increased fruit and vegetable intake. Reports medication compliance.;Short term: Daily weights obtained and reported for increase. Utilizing diuretic protocols set by physician.;Long term: Adoption of self-care skills and reduction of barriers for early signs and symptoms recognition and intervention leading to self-care maintenance.          Core Components/Risk Factors/Patient Goals Review:    Core Components/Risk Factors/Patient Goals at Discharge (Final Review):    ITP Comments:   Comments: Dr. Slater Staff is Medical Director for Pulmonary Rehab at [facility].

## 2023-12-27 NOTE — Progress Notes (Signed)
 Pulmonary Rehab Orientation Note  Patient Details  Name: Victoria [name] MRN: [id number] Date of Birth: January 22, 1961 Referring Provider:  Dr. Kara  This patient who was referred to Pulmonary Rehab by Dr. Kara with the diagnosis of chronic diastolic heart failure arrived today in Cardiac and Pulmonary Rehab. She  arrived ambulatory with normal gait. She  does carry portable oxygen. [employer] is the provider for their DME. Per patient, Victoria [name] uses oxygen with exercise. Color good, skin warm and dry. Patient is oriented to time and place. Patient's medical history, psychosocial health, and medications reviewed. Psychosocial assessment reveals patient lives with spouse. Victoria [name] is currently retired. Patient hobbies include watching tv and spending time with others. Patient reports her stress level is moderate. Areas of stress/anxiety include health and insurance running out at the end of the year. Patient does possibly exhibit signs of depression. Signs of depression include sadness and fatigue. PHQ2/9 score 3/10. Victoria [name] shows good  coping skills with positive outlook on life. Offered emotional support and reassurance. Will continue to monitor and evaluate progress toward psychosocial goal(s) of decreased health related stress. Physical assessment reveals heart rate is normal, breath sounds clear to auscultation, no wheezes, rales, or rhonchi. Grip strength equal, strong. Distal pulses present. Victoria [name] reports she does take medications as prescribed. Patient states she follows a low fat  diet. The patient has been trying to lose weight through a healthy diet and exercise program. Pt's weight will be monitored closely. Demonstration and practice of PLB using pulse oximeter. Victoria [name] able to return demonstration satisfactorily. Safety and hand hygiene in the exercise area reviewed with patient. Victoria [name] voices understanding of the information reviewed. Department expectations discussed with patient and achievable goals were  set. The patient shows enthusiasm about attending the program and we look forward to working with Victoria [name]. Victoria [name] completed a 6 min walk test today and is scheduled to begin exercise on 11/6 at 1015.   [id number] Victoria [name]

## 2023-12-27 NOTE — Patient Instructions (Addendum)
 Patient requesting Cardiopulmonary evaluation at [facility] for chronic diastolic heart failure. Currently oxygen dependent and severely limited in activities. Referral has been placed.

## 2023-12-30 ENCOUNTER — Encounter: Payer: Self-pay | Admitting: Radiology

## 2024-01-02 ENCOUNTER — Encounter (HOSPITAL_COMMUNITY)
Admission: RE | Admit: 2024-01-02 | Discharge: 2024-01-02 | Disposition: A | Discharge status: Home / Self Care | Source: Ambulatory Visit | Attending: Pulmonary Disease | Admitting: Pulmonary Disease

## 2024-01-02 DIAGNOSIS — I5032 Chronic diastolic (congestive) heart failure: Secondary | ICD-10-CM | POA: Insufficient documentation

## 2024-01-02 NOTE — Progress Notes (Signed)
 Daily Session Note  Patient Details  Name: Victoria [name] MRN: [id number] Date of Birth: 1960/03/11 Referring Provider:   Conrad Ports Pulmonary Rehab Walk Test from 12/27/2023 in [facility]  Referring Provider Dewald    Encounter Date: 01/02/2024  Check In:  Session Check In - 01/02/24 1131       Check-In   Supervising physician immediately available to respond to emergencies [facility] MD immediately available    Physician(s) Orren Fabry, NP    Location [facility]    Staff Present Saturnino Koyanagi, RN, Mallory Parkins, MS, ACSM-CEP, CCRP, Exercise Physiologist;Houda Brau Claudene West Quan, RN, Maud Moats, MS, ACSM-CEP, Exercise Physiologist;Johnny Fayette, MS, Exercise Physiologist    Virtual Visit No    Medication changes reported     No    Fall or balance concerns reported    No    Tobacco Cessation No Change    Warm-up and Cool-down Performed as group-led instruction   only   Resistance Training Performed Yes    VAD Patient? No    PAD/SET Patient? No      Pain Assessment   Currently in Pain? No/denies    Multiple Pain Sites No          Capillary Blood Glucose: No results found. However, due to the size of the patient record, not all encounters were searched. Please check Results Review for a complete set of results.    Social History   Tobacco Use  Smoking Status Never  Smokeless Tobacco Never    Goals Met:  Proper associated with RPD/PD & O2 Sat Independence with exercise equipment Exercise tolerated well No report of concerns or symptoms today Strength training completed today  Goals Unmet:  Not Applicable  Comments: Service time is from 1011 to 1142.    Dr. Slater Staff is Medical Director for Pulmonary Rehab at [facility].

## 2024-01-06 ENCOUNTER — Ambulatory Visit: Admitting: Nurse Practitioner

## 2024-01-06 ENCOUNTER — Ambulatory Visit: Admitting: Pulmonary Disease

## 2024-01-06 ENCOUNTER — Other Ambulatory Visit: Payer: Self-pay

## 2024-01-06 ENCOUNTER — Encounter: Payer: Self-pay | Admitting: Nurse Practitioner

## 2024-01-06 VITALS — BP 118/80 | HR 71

## 2024-01-06 DIAGNOSIS — N1831 Chronic kidney disease, stage 3a: Secondary | ICD-10-CM

## 2024-01-06 DIAGNOSIS — N951 Menopausal and female climacteric states: Secondary | ICD-10-CM

## 2024-01-06 DIAGNOSIS — E7800 Pure hypercholesterolemia, unspecified: Secondary | ICD-10-CM | POA: Diagnosis not present

## 2024-01-06 DIAGNOSIS — N760 Acute vaginitis: Secondary | ICD-10-CM

## 2024-01-06 DIAGNOSIS — R7302 Impaired glucose tolerance (oral): Secondary | ICD-10-CM | POA: Diagnosis not present

## 2024-01-06 DIAGNOSIS — B3731 Acute candidiasis of vulva and vagina: Secondary | ICD-10-CM | POA: Diagnosis not present

## 2024-01-06 DIAGNOSIS — N898 Other specified noninflammatory disorders of vagina: Secondary | ICD-10-CM

## 2024-01-06 DIAGNOSIS — B9689 Other specified bacterial agents as the cause of diseases classified elsewhere: Secondary | ICD-10-CM

## 2024-01-06 DIAGNOSIS — E039 Hypothyroidism, unspecified: Secondary | ICD-10-CM

## 2024-01-06 LAB — WET PREP FOR TRICH, YEAST, CLUE

## 2024-01-06 MED ORDER — FLUCONAZOLE 150 MG PO TABS: 150.0000 mg | ORAL_TABLET | ORAL | 0 refills | Status: DC

## 2024-01-06 MED ORDER — METRONIDAZOLE 0.75 % VA GEL: 1.0000 | Freq: Every day | VAGINAL | 0 refills | Status: AC

## 2024-01-06 NOTE — Progress Notes (Signed)
   Acute Office Visit  Subjective:    Patient ID: Victoria [name], female    DOB: 06-11-1960, 63 y.o.   MRN: [id number]   HPI 63 y.o. presents today for vaginal itching x 2 weeks. Denies discharge or odor. Uses vaginal estrogen. ERT discontinued this year due to diagnosis of heart failure. Experiencing hot flashes. Wondering if there is something else she can try. Switching insurances soon.   No LMP recorded. Patient has had a hysterectomy.    Review of Systems  Constitutional: Negative.   Endocrine: Positive for heat intolerance.  Genitourinary:  Positive for vaginal pain (Itching). Negative for vaginal discharge.       Objective:    Physical Exam Constitutional:      Appearance: Normal appearance.  Genitourinary:    General: Normal vulva.     Vagina: Vaginal discharge present. No erythema.     BP 118/80   Pulse 71   SpO2 96%  Wt Readings from Last 3 Encounters:  12/27/23 (!) 302 lb 7.5 oz (137.2 kg)  12/26/23 (!) 301 lb (136.5 kg)  12/18/23 291 lb (132 kg)        Zada Louder, CMA present as biomedical engineer.   Wet prep + yeast, + clue cells (+ odor)  Assessment & Plan:   Problem List Items Addressed This Visit   None Visit Diagnoses       Vaginal candidiasis    -  Primary   Relevant Medications   fluconazole  (DIFLUCAN ) 150 MG tablet     Vasomotor symptoms due to menopause         Vaginal itching       Relevant Orders   WET PREP FOR TRICH, YEAST, CLUE     Bacterial vaginosis       Relevant Medications   metroNIDAZOLE  (METROGEL ) 0.75 % vaginal gel         Plan: Wet prep positive for yeast and BV. Diflucan  150 mg every 3 days x 2 doses, Metrogel  nightly x 5 nights. Discussed options for SSRI/SNRI, Veozah for vasomotor symptoms. Plans to discuss with Dr. Nikki once her new insurance is started.   Return if symptoms worsen or fail to improve.    Annabella DELENA Shutter DNP, 12:03 PM 01/06/2024

## 2024-01-07 ENCOUNTER — Ambulatory Visit: Payer: Self-pay | Admitting: Internal Medicine

## 2024-01-07 ENCOUNTER — Encounter (HOSPITAL_COMMUNITY)
Admission: RE | Admit: 2024-01-07 | Discharge: 2024-01-07 | Disposition: A | Discharge status: Home / Self Care | Source: Ambulatory Visit | Attending: Pulmonary Disease | Admitting: Pulmonary Disease

## 2024-01-07 ENCOUNTER — Other Ambulatory Visit: Payer: Self-pay | Admitting: Internal Medicine

## 2024-01-07 VITALS — Wt 293.7 lb

## 2024-01-07 DIAGNOSIS — I5032 Chronic diastolic (congestive) heart failure: Secondary | ICD-10-CM

## 2024-01-07 DIAGNOSIS — L732 Hidradenitis suppurativa: Secondary | ICD-10-CM | POA: Diagnosis not present

## 2024-01-07 DIAGNOSIS — M549 Dorsalgia, unspecified: Secondary | ICD-10-CM | POA: Diagnosis not present

## 2024-01-07 DIAGNOSIS — G4733 Obstructive sleep apnea (adult) (pediatric): Secondary | ICD-10-CM | POA: Diagnosis not present

## 2024-01-07 DIAGNOSIS — R5383 Other fatigue: Secondary | ICD-10-CM | POA: Diagnosis not present

## 2024-01-07 DIAGNOSIS — M2550 Pain in unspecified joint: Secondary | ICD-10-CM | POA: Diagnosis not present

## 2024-01-07 DIAGNOSIS — M1990 Unspecified osteoarthritis, unspecified site: Secondary | ICD-10-CM | POA: Diagnosis not present

## 2024-01-07 DIAGNOSIS — R0602 Shortness of breath: Secondary | ICD-10-CM | POA: Diagnosis not present

## 2024-01-07 DIAGNOSIS — I2723 Pulmonary hypertension due to lung diseases and hypoxia: Secondary | ICD-10-CM | POA: Diagnosis not present

## 2024-01-07 DIAGNOSIS — R760 Raised antibody titer: Secondary | ICD-10-CM | POA: Diagnosis not present

## 2024-01-07 LAB — LIPID PANEL
Cholesterol: 259 mg/dL — ABNORMAL HIGH (ref <200)
HDL: 97 mg/dL (ref >=50)
LDL Cholesterol (Calc): 146 mg/dL — ABNORMAL HIGH
Non-HDL Cholesterol (Calc): 162 mg/dL — ABNORMAL HIGH (ref <130)
Total CHOL/HDL Ratio: 2.7 (calc) (ref <5.0)
Triglycerides: 69 mg/dL (ref <150)

## 2024-01-07 LAB — TSH: TSH: 0.25 m[IU]/L — ABNORMAL LOW (ref 0.40–4.50)

## 2024-01-07 LAB — HEPATIC FUNCTION PANEL
AG Ratio: 1.4 (calc) (ref 1.0–2.5)
ALT: 16 U/L (ref 6–29)
AST: 17 U/L (ref 10–35)
Albumin: 4.4 g/dL (ref 3.6–5.1)
Alkaline phosphatase (APISO): 61 U/L (ref 37–153)
Bilirubin, Direct: 0.1 mg/dL (ref 0.0–0.2)
Globulin: 3.1 g/dL (ref 1.9–3.7)
Indirect Bilirubin: 0.6 mg/dL (ref 0.2–1.2)
Total Bilirubin: 0.7 mg/dL (ref 0.2–1.2)
Total Protein: 7.5 g/dL (ref 6.1–8.1)

## 2024-01-07 LAB — HEMOGLOBIN A1C
Hgb A1c MFr Bld: 5.9 % — ABNORMAL HIGH (ref <5.7)
Mean Plasma Glucose: 123 mg/dL
eAG (mmol/L): 6.8 mmol/L

## 2024-01-07 NOTE — Progress Notes (Signed)
 Daily Session Note  Patient Details  Name: Analiese Krupka MRN: [id number] Date of Birth: 04-02-1960 Referring Provider:   Conrad Ports Pulmonary Rehab Walk Test from 12/27/2023 in [facility]  Referring Provider Dewald    Encounter Date: 01/07/2024  Check In:  Session Check In - 01/07/24 1059       Check-In   Supervising physician immediately available to respond to emergencies [facility] MD immediately available    Physician(s) Barnie Press, NP    Location [facility]    Staff Present Augustin Sharps, Neita Moats, MS, ACSM-CEP, Exercise Physiologist;Sedale Jenifer Harvy, RN, BSN;Randi Midge BS, ACSM-CEP, Exercise Physiologist;Johnny Fayette, MS, Exercise Physiologist    Virtual Visit No    Medication changes reported     No    Fall or balance concerns reported    No    Tobacco Cessation No Change    Warm-up and Cool-down Performed as group-led instruction   only   Resistance Training Performed Yes    VAD Patient? No    PAD/SET Patient? No      Pain Assessment   Currently in Pain? No/denies    Multiple Pain Sites No          Capillary Blood Glucose: No results found. However, due to the size of the patient record, not all encounters were searched. Please check Results Review for a complete set of results.   Exercise Prescription Changes - 01/07/24 1100       Response to Exercise   Blood Pressure (Admit) 104/70    Blood Pressure (Exercise) 124/80    Blood Pressure (Exit) 104/80    Heart Rate (Admit) 74 bpm    Heart Rate (Exercise) 80 bpm    Heart Rate (Exit) 73 bpm    Oxygen Saturation (Admit) 100 %   2L   Oxygen Saturation (Exercise) 97 %   2L   Oxygen Saturation (Exit) 100 %   2L   Rating of Perceived Exertion (Exercise) 11    Perceived Dyspnea (Exercise) 1    Duration Continue with 30 min of aerobic exercise without signs/symptoms of physical distress.    Intensity THRR unchanged       Progression   Progression Continue to progress workloads to maintain intensity without signs/symptoms of physical distress.      Resistance Training   Training Prescription Yes    Weight red bands    Reps 10-15    Time 10 Minutes      Oxygen   Oxygen Continuous    Liters 2      NuStep   Level 1    Minutes 30    METs 1.7      Oxygen   Maintain Oxygen Saturation 88% or higher          Social History   Tobacco Use  Smoking Status Never  Smokeless Tobacco Never    Goals Met:  Exercise tolerated well Queuing for purse lip breathing No report of concerns or symptoms today Strength training completed today  Goals Unmet:  Not Applicable  Comments: Service time is from 1002 to 1132    Dr. Slater Staff is Medical Director for Pulmonary Rehab at [facility].

## 2024-01-07 NOTE — Progress Notes (Signed)
 Patient Care Team: Perri Ronal PARAS, MD as PCP - General Shlomo Wilbert SAUNDERS, MD as PCP - Cardiology (Cardiology) Mealor, Eulas BRAVO, MD as PCP - Electrophysiology (Cardiology) Wonda Cy BROCKS, RD as Dietitian (Family Medicine)  Visit Date: 01/10/24  Subjective:    Patient ID: Victoria [name] , Female   DOB: 1960/12/31, 63 y.o.    MRN: [id number]   63 y.o. Female presents today for 6 month follow up for Hyperlipidemia, Nonsustained atrial tachycardia, Patient has a past medical history of Impaired glucose tolerance, Hypothyroidism, Anxiety, Vitamin D  deficiency, OSA, Migraine Headaches, Asthma, Musculoskeletal pain.  History of Chronic Diastolic heart failure. Has been evaluated by [facility], Drs. Wilbert Shlomo and Peter Jordan.Patient is frustrated with her situation of chronic fatigue and no energy.   Also seeing Dr. Kara, Pulmonologist.  She requires continuous oxygen therapy but still has fatigue and shortness of breath. In May of this year she had a BNP of 114 and a month ago it was 22. She has an appointment with [facility] soon. She has concerns about her cardiac situation. Has chronic fatigue and decreased exercise tolerance. Dr. Kara feels she has chronic diastolic heart failure, sleep apnea, and postcapillary pulmonary hypertension.  Cardiologist has her on Jardiance  for weight loss and Type 2 Diabetes.  She had cardiac cath by Dr. Peter Jordan on Sept 19 revealing mild pulmonary  hypertension with PAP 42/18, mean 30 mm Hg, Elevated LV filling pressures PCWP 27/28, mean 23 mm Hg. Pt had normal cardiac output 7.2 L/min. Was recommended to take Lasix  every other day and restrict sodium.  Says she has has had SOB since the beginning of 2025. In [place], she presented to the ED with SOB. D-dimer was elevated at 2.84. BNP was normal at 177. CT angio of the chest showed bronchomalacia involving right bronchus intermedius and left lower lobar bronchus.  No acute PE.  Had mild  bronchial wall thickening consistent with airway inflammation.  History of Palpitations; Non-sustained Atrial Tachycardia treated with Flecainide  50 mg daily, Metoprolol  succinate 25 mg daily, and started on Lasix  40 mg daily x3 days on 5/08 for Hypervolemia w/ Klor-Con  10 meq to prevent potassium deficiency BP normal today at 100/70.   History of History of Hyperlipidemia with 01/06/2024 Lipid Panel CHOL 259, LDL 146, Otherwise WNL.  She is not currently on a statin.   Impaired Glucose Tolerance with 01/06/2024 HgbA1c 5.9% decreased from 6.1% a year ago.    History of Hypothyroidism treated with Levothyroxine  112 mcg. 01/06/2024 TSH 0.25. S/p Thyroidectomy 1980.   History of Obesity; Weight today was 290 lb BMI 46.11. 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        History of Iron  Deficiency; Benign Ethnic Neutropenia  11/12/2023 CBC normal.    History of Vitamin-B12 Deficiency treated with Dodex  1 mL IM monthly. Vitamin-D Deficiency treated with Vitamin-D 50,000 units weekly.    History of Anxiety/Depression treated with Lorazepam  1 mg twice daily and Wellbutrin -XL 150 mg daily.  History of hyperlipidemia-currently not on statin  History of elevated BNP of 114 in May 2025   History of Asthma treated with Albuterol  inhaler as needed.    History of Hidradenitis Suppurativa treated with Humira  40 mg injected weekly. No episodes in several years.   History of Musculoskeletal Pain; Chronic Back Pain treated with Gabapentin  600 mg three times daily and Tizanidine  4 mg three times daily. S/p Total Right Knee Arthroplasty by Dr. Vernetta 07/13/2022; S/p Total  Left Knee Arthroplasty by Dr. Melodi 01/20/2018.    History of Migraine Headaches w/ associated Vertigo treated with Emgality  120 mg monthly and Ubrelvy  100 mg as needed every two hours, maximum 200 mg. Followed by Dr. Onetha Epp. Also uses Phenergan  25 mg as needed for nausea.    History of OSA treated  with a CPAP machine.     Past Medical History:  Diagnosis Date   Allergy  approx-5years ago   estradiol ,topical patch only-rash   Anxiety    Back pain    Blood transfusion without reported diagnosis 2010   after hip replacement   Bursitis    right shoulder   Cataract September 2019   bilateral, lasik   Chronic diastolic heart failure (HCC)    Chronic kidney disease    hematuria, has been worked up, her normal   Chronic leukopenia    intermittant since 2010, followed by Dr. Perri now   Chronic neck pain    Constipation    Degenerative joint disease of low back 09/02/2015   Dr. Hiram   Diastolic heart failure [facility])    Displacement of lumbar intervertebral disc    Dysrhythmia    Tachycardia, PVCs, PACs   Edema of both lower legs    Fibrocystic breast    Gallbladder problem    GERD (gastroesophageal reflux disease)    Headache    per pt more stress/tension   Heart palpitations    SEE EPIC ENCOUTNER , CARDIOLOGY DR. WILBERT TURNER 2018; reports on 05-16-17  i haven't had any bouts of those lately     Hemorrhoids    Hidradenitis suppurativa    History of Clostridium difficile infection 12/2010   History of exercise intolerance    ETT on 04-27-2016-- negative ([facility] treadmill score 7)   History of Helicobacter pylori infection 2001 and 09/ 2012   HSV-2 infection    genital   Hx of adenomatous colonic polyps 10/17/2007   Hydradenitis    per pt currently treated with Humira  injection   Hypertension    Hyperthyroidism    Hypocupremia    Hypothyroidism    Hypothyroidism, postsurgical 1980   IBS (irritable bowel syndrome)    Insomnia    Iron  deficiency    Joint pain    Leukopenia    Lumbosacral radiculopathy    Lymphocytic colitis 05/14/2022   dx from colonoscopy   Medial meniscus tear    right knee   Mild obstructive sleep apnea    Mitral regurgitation    Mild by echo 06/2023   Numbness and tingling of right arm    Obesity    Osteoarthritis    Palpitations     Pernicious anemia    b12 def   Peroneal neuropathy    PONV (postoperative nausea and vomiting)    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   Prediabetes    Pulmonary hypertension (HCC)    Reactive hypoglycemia followed by pcp   post gastrectomy dumping syndrome   SOB (shortness of breath)    Spinal stenosis    Tendonitis    right shoulder   Tricuspid regurgitation    Mild to moderate echo 06/2023   Varicose veins    Vertigo    Vitamin B 12 deficiency    Vitamin D  deficiency      Family History  Problem Relation Age of Onset   High blood pressure Mother    Hypertension Mother    Headache Mother    High Cholesterol Mother    Thyroid   disease Mother    Obesity Mother    Kidney disease Mother    Arthritis Mother    Hyperlipidemia Mother    Hypertension Father    Diabetes Father    High blood pressure Father    High Cholesterol Father    Heart disease Father    Obesity Father    Vision loss Father    Diabetes Sister    Hypertension Sister    Obesity Sister    Thyroid  disease Brother    Thyroid  disease Maternal Aunt    Hypertension Maternal Grandmother    Stroke Maternal Grandmother    Diabetes Paternal Grandmother    Vision loss Paternal Grandmother    Diabetes Paternal Aunt    Hyperlipidemia Maternal Aunt    Hypertension Maternal Aunt    Obesity Maternal Aunt    Migraines Neg Hx     Social History   Social History Narrative   Lives at home with spouse   Right handed   Caffeine: diet zero mtn dew, occasionally     2025 - Recently retired from [employer] as an [employer], previously worked full-time, now working 1 day/ week. Divorced, remarried - husband is disabled. 2 adult twin daughters. Non-smoker or drinker.       Review of Systems  All other systems reviewed and are negative.       Objective:   Vitals: BP 100/70   Pulse 70   Ht 5' 6.5 (1.689 m)   Wt 290 lb (131.5 kg)   SpO2 96%   BMI 46.11 kg/m    Physical Exam Vitals and nursing note  reviewed.     She ambulates slowly.  Is on chronic oxygen therapy.  Looks fatigued.  Chest clear.  Cardiac exam regular rate and rhythm.  Results:      Labs:       Component Value Date/Time   NA 143 11/29/2023 0849   K 4.5 11/29/2023 0849   CL 105 11/29/2023 0849   CO2 24 11/29/2023 0849   GLUCOSE 96 11/29/2023 0849   GLUCOSE 104 (H) 09/05/2023 1626   BUN 22 11/29/2023 0849   CREATININE 1.25 (H) 11/29/2023 0849   CREATININE 1.28 (H) 09/05/2023 1626   CALCIUM  9.5 11/29/2023 0849   PROT 7.5 01/06/2024 0931   PROT 7.2 10/17/2023 0923   ALBUMIN 4.2 10/17/2023 0923   AST 17 01/06/2024 0931   AST 26 10/16/2018 0903   ALT 16 01/06/2024 0931   ALT 26 10/16/2018 0903   ALKPHOS 73 10/17/2023 0923   BILITOT 0.7 01/06/2024 0931   BILITOT 0.4 10/17/2023 0923   BILITOT 0.5 10/16/2018 0903   GFRNONAA 51 (L) 06/12/2023 1810   GFRNONAA 93 06/30/2020 1105   GFRAA 108 06/30/2020 1105     Lab Results  Component Value Date   WBC 5.5 11/12/2023   HGB 10.9 (L) 11/15/2023   HCT 32.0 (L) 11/15/2023   MCV 99.7 11/12/2023   PLT 178 11/12/2023    Lab Results  Component Value Date   CHOL 259 (H) 01/06/2024   HDL 97 01/06/2024   LDLCALC 146 (H) 01/06/2024   TRIG 69 01/06/2024   CHOLHDL 2.7 01/06/2024    Lab Results  Component Value Date   HGBA1C 5.9 (H) 01/06/2024     Lab Results  Component Value Date   TSH 0.25 (L) 01/06/2024        Assessment & Plan:   Chronic diastolic heart failure: Has been followed by [facility], Drs. Wilbert Bihari and Maude  Jordan. Also seeing Dr. Kara, Pulmonologist.  She requires continuous oxygen therapy but still has fatigue and shortness of breath. In May of this year she had a BNP of 114 and a month ago it was 22. She has an appointment with [facility] coming up soon.   Palpitations; Non-sustained Atrial Tachycardia: treated with Flecainide  50 mg daily, Metoprolol  succinate 25 mg daily, and started on Lasix  40 mg daily x3  days on 5/08 for Hypervolemia w/ Klor-Con  10 meq to prevent potassium deficiency. Blood pressure normal today at 100/70.   Hyperlipidemia: with 01/06/2024 Lipid Panel CHOL 259, LDL 146, Otherwise WNL.  She is not currently on a statin.   Impaired Glucose Tolerance: with 01/06/2024 HgbA1c 5.9% decreased from 6.1% a year ago.    Hypothyroidism: treated with Levothyroxine  112 mcg. 01/06/2024 TSH 0.25. S/p Thyroidectomy 1980.  Her TSH is low at 0.25 and dose of levothyroxine  needs to be reduced.  We are aiming for a TSH of 1.00.  We will try reducing levothyroxine  to 75 mcg daily and follow-up in 6 weeks with TSH.   Obesity: Weight today was 290 lb BMI 46.11. 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        Iron  Deficiency; Benign Ethnic Neutropenia:  11/12/2023 CBC normal.    Vitamin-B12 Deficiency: treated with Dodex  1 mL IM monthly. Vitamin-D Deficiency treated with Vitamin-D 50,000 units weekly.    Anxiety/Depression: treated with Lorazepam  1 mg twice daily and Wellbutrin -XL 150 mg daily.   Lorazepam  refilled.    Asthma: treated with Albuterol  inhaler as needed.    Hidradenitis Suppurativa: treated with Humira  40 mg injected weekly. No episodes in several years.   Musculoskeletal Pain; Chronic Back Pain: treated with Gabapentin  600 mg three times daily and Tizanidine  4 mg three times daily. S/p Total Right Knee Arthroplasty by Dr. Vernetta 07/13/2022; S/p Total Left Knee Arthroplasty by Dr. Melodi 01/20/2018.    Migraine Headaches w/ associated Vertigo: treated with Emgality  120 mg monthly and Ubrelvy  100 mg as needed every two hours, maximum 200 mg. Followed by Dr. Onetha Epp. Also uses Phenergan  25 mg as needed for nausea.    OSA: treated with a CPAP machine.  Plan: She has upcoming appointment with [facility] Department of Cardiology.   I,Makayla C Reid,acting as a scribe for Ronal JINNY Hailstone, MD.,have documented all relevant  documentation on the behalf of Ronal JINNY Hailstone, MD,as directed by  Ronal JINNY Hailstone, MD while in the presence of Ronal JINNY Hailstone, MD.   I, Ronal JINNY Hailstone, MD, have reviewed all documentation for this visit. The documentation on 01/10/2024 for the exam, diagnosis, procedures, and orders are all accurate and complete.

## 2024-01-09 ENCOUNTER — Encounter (HOSPITAL_COMMUNITY)
Admission: RE | Admit: 2024-01-09 | Discharge: 2024-01-09 | Disposition: A | Source: Ambulatory Visit | Attending: Pulmonary Disease

## 2024-01-09 DIAGNOSIS — I5032 Chronic diastolic (congestive) heart failure: Secondary | ICD-10-CM | POA: Diagnosis not present

## 2024-01-09 NOTE — Progress Notes (Signed)
 Daily Session Note  Patient Details  Name: Victoria [name] MRN: [id number] Date of Birth: Jan 09, 1961 Referring Provider:   Conrad Ports Pulmonary Rehab Walk Test from 12/27/2023 in [facility], [facility]  Referring Provider Dewald    Encounter Date: 01/09/2024  Check In:  Session Check In - 01/09/24 1132       Check-In   Supervising physician immediately available to respond to emergencies [facility] MD immediately available    Physician(s) Josefa Beauvais, NP    Location [facility]    Staff Present Augustin Sharps, Neita Moats, MS, ACSM-CEP, Exercise Physiologist;Mary Harvy, RN, BSN;Randi Reeve BS, ACSM-CEP, Exercise Physiologist    Virtual Visit No    Medication changes reported     No    Fall or balance concerns reported    No    Tobacco Cessation No Change    Warm-up and Cool-down Performed as group-led instruction   only   Resistance Training Performed Yes    VAD Patient? No    PAD/SET Patient? No      Pain Assessment   Currently in Pain? No/denies    Multiple Pain Sites No          Capillary Blood Glucose: No results found. However, due to the size of the patient record, not all encounters were searched. Please check Results Review for a complete set of results.    Social History   Tobacco Use  Smoking Status Never  Smokeless Tobacco Never    Goals Met:  Proper associated with RPD/PD & O2 Sat Exercise tolerated well No report of concerns or symptoms today Strength training completed today  Goals Unmet:  Not Applicable  Comments: Service time is from 0958 to 1139.    Dr. Slater Staff is Medical Director for Pulmonary Rehab at [facility].

## 2024-01-10 ENCOUNTER — Ambulatory Visit (INDEPENDENT_AMBULATORY_CARE_PROVIDER_SITE_OTHER): Payer: Self-pay | Admitting: Internal Medicine

## 2024-01-10 ENCOUNTER — Encounter: Payer: Self-pay | Admitting: Internal Medicine

## 2024-01-10 VITALS — BP 100/70 | HR 70 | Ht 66.5 in | Wt 290.0 lb

## 2024-01-10 DIAGNOSIS — E669 Obesity, unspecified: Secondary | ICD-10-CM | POA: Diagnosis not present

## 2024-01-10 DIAGNOSIS — I5032 Chronic diastolic (congestive) heart failure: Secondary | ICD-10-CM

## 2024-01-10 DIAGNOSIS — F418 Other specified anxiety disorders: Secondary | ICD-10-CM | POA: Diagnosis not present

## 2024-01-10 DIAGNOSIS — R002 Palpitations: Secondary | ICD-10-CM | POA: Diagnosis not present

## 2024-01-10 DIAGNOSIS — R7302 Impaired glucose tolerance (oral): Secondary | ICD-10-CM | POA: Diagnosis not present

## 2024-01-10 DIAGNOSIS — E7800 Pure hypercholesterolemia, unspecified: Secondary | ICD-10-CM

## 2024-01-10 DIAGNOSIS — E039 Hypothyroidism, unspecified: Secondary | ICD-10-CM | POA: Diagnosis not present

## 2024-01-10 DIAGNOSIS — E559 Vitamin D deficiency, unspecified: Secondary | ICD-10-CM | POA: Diagnosis not present

## 2024-01-10 DIAGNOSIS — L732 Hidradenitis suppurativa: Secondary | ICD-10-CM | POA: Diagnosis not present

## 2024-01-10 DIAGNOSIS — E785 Hyperlipidemia, unspecified: Secondary | ICD-10-CM

## 2024-01-10 DIAGNOSIS — G4733 Obstructive sleep apnea (adult) (pediatric): Secondary | ICD-10-CM

## 2024-01-10 DIAGNOSIS — Z9884 Bariatric surgery status: Secondary | ICD-10-CM

## 2024-01-10 DIAGNOSIS — R7989 Other specified abnormal findings of blood chemistry: Secondary | ICD-10-CM

## 2024-01-10 DIAGNOSIS — I270 Primary pulmonary hypertension: Secondary | ICD-10-CM

## 2024-01-10 DIAGNOSIS — Z8639 Personal history of other endocrine, nutritional and metabolic disease: Secondary | ICD-10-CM

## 2024-01-10 DIAGNOSIS — G43909 Migraine, unspecified, not intractable, without status migrainosus: Secondary | ICD-10-CM | POA: Diagnosis not present

## 2024-01-10 DIAGNOSIS — Z9889 Other specified postprocedural states: Secondary | ICD-10-CM

## 2024-01-10 DIAGNOSIS — R5381 Other malaise: Secondary | ICD-10-CM

## 2024-01-10 DIAGNOSIS — M549 Dorsalgia, unspecified: Secondary | ICD-10-CM

## 2024-01-10 MED ORDER — LEVOTHYROXINE SODIUM 100 MCG PO TABS: 100.0000 ug | ORAL_TABLET | Freq: Every day | ORAL | 3 refills | Status: DC

## 2024-01-10 MED ORDER — LEVOTHYROXINE SODIUM 75 MCG PO TABS: 75.0000 ug | ORAL_TABLET | Freq: Every day | ORAL | 0 refills | Status: DC

## 2024-01-10 NOTE — Patient Instructions (Signed)
 TSH is low on levothyroxine  100 mcg daily.  Reduce thyroid  replacement medication to 75 mcg daily and follow-up in 6 weeks with TSH.  Patient has upcoming appointment with [facility] Department of cardiology.

## 2024-01-13 ENCOUNTER — Encounter: Admitting: Internal Medicine

## 2024-01-13 ENCOUNTER — Other Ambulatory Visit: Payer: Self-pay | Admitting: Pulmonary Disease

## 2024-01-14 ENCOUNTER — Encounter (HOSPITAL_COMMUNITY)
Admission: RE | Admit: 2024-01-14 | Discharge: 2024-01-14 | Disposition: A | Discharge status: Home / Self Care | Source: Ambulatory Visit | Attending: Pulmonary Disease | Admitting: Pulmonary Disease

## 2024-01-14 DIAGNOSIS — I5032 Chronic diastolic (congestive) heart failure: Secondary | ICD-10-CM

## 2024-01-14 NOTE — Progress Notes (Signed)
 Daily Session Note  Patient Details  Name: Victoria [name] MRN: [id number] Date of Birth: [birth year] Referring Provider:   Conrad Ports Pulmonary Rehab Walk Test from 12/27/2023 in [facility] Heart, Vascular, & Lung Health  Referring Provider Dewald    Encounter Date: 01/14/2024  Check In:  Session Check In - 01/14/24 1121       Check-In   Supervising physician immediately available to respond to emergencies [facility] MD immediately available    Physician(s) Josefa Beauvais, NP    [facility]    Staff Present Augustin Sharps, Neita Moats, MS, ACSM-CEP, Exercise Physiologist;Mary Harvy, RN, BSN;Randi Midge BS, ACSM-CEP, Exercise Physiologist;Maria Whitaker, RN, BSN    Virtual Visit No    Medication changes reported     No    Fall or balance concerns reported    No    Tobacco Cessation No Change    Warm-up and Cool-down Performed as group-led instruction   only   Resistance Training Performed Yes    VAD Patient? No    PAD/SET Patient? No      Pain Assessment   Currently in Pain? No/denies    Multiple Pain Sites No          Capillary Blood Glucose: No results found. However, due to the size of the patient record, not all encounters were searched. Please check Results Review for a complete set of results.    Social History   Tobacco Use  Smoking Status Never  Smokeless Tobacco Never    Goals Met:  Proper associated with RPD/PD & O2 Sat Independence with exercise equipment Exercise tolerated well No report of concerns or symptoms today Strength training completed today  Goals Unmet:  Not Applicable  Comments: Service time is from 1003 to 1132.    Dr. Slater Staff is Medical Director for Pulmonary Rehab at [facility].

## 2024-01-15 ENCOUNTER — Encounter: Payer: Self-pay | Admitting: Nurse Practitioner

## 2024-01-15 ENCOUNTER — Other Ambulatory Visit: Payer: Self-pay | Admitting: Nurse Practitioner

## 2024-01-15 ENCOUNTER — Ambulatory Visit: Admitting: Bariatrics

## 2024-01-15 ENCOUNTER — Encounter: Payer: Self-pay | Admitting: Bariatrics

## 2024-01-15 ENCOUNTER — Other Ambulatory Visit: Payer: Self-pay | Admitting: Internal Medicine

## 2024-01-15 VITALS — BP 122/81 | HR 71 | Ht 66.5 in | Wt 286.0 lb

## 2024-01-15 DIAGNOSIS — Z6841 Body Mass Index (BMI) 40.0 and over, adult: Secondary | ICD-10-CM

## 2024-01-15 DIAGNOSIS — R7303 Prediabetes: Secondary | ICD-10-CM

## 2024-01-15 DIAGNOSIS — E66813 Obesity, class 3: Secondary | ICD-10-CM

## 2024-01-15 DIAGNOSIS — E7800 Pure hypercholesterolemia, unspecified: Secondary | ICD-10-CM

## 2024-01-15 DIAGNOSIS — B3731 Acute candidiasis of vulva and vagina: Secondary | ICD-10-CM

## 2024-01-15 DIAGNOSIS — E785 Hyperlipidemia, unspecified: Secondary | ICD-10-CM | POA: Diagnosis not present

## 2024-01-15 MED ORDER — TERCONAZOLE 0.8 % VA CREA: 1.0000 | TOPICAL_CREAM | Freq: Every day | VAGINAL | 0 refills | Status: DC

## 2024-01-15 NOTE — Progress Notes (Signed)
 WEIGHT SUMMARY AND BIOMETRICS  Weight Lost Since Last Visit: 5lb  Weight Gained Since Last Visit: 0   Vitals BP: 122/81 Pulse Rate: 71 SpO2: 98 %   Anthropometric Measurements Height: 5' 6.5 (1.689 m) Weight: 286 lb (129.7 kg) BMI (Calculated): 45.48 Weight at Last Visit: 291lb Weight Lost Since Last Visit: 5lb Weight Gained Since Last Visit: 0 Starting Weight: 308lb Total Weight Loss (lbs): 22 lb (9.979 kg)   Body Composition  Body Fat %: 54.6 % Fat Mass (lbs): 156.2 lbs Muscle Mass (lbs): 123.2 lbs Total Body Water  (lbs): 90 lbs Visceral Fat Rating : 20   Other Clinical Data Fasting: no Labs: no Today's Visit #: 5 Starting Date: 10/17/23    OBESITY Victoria [name] is here to discuss her progress with her obesity treatment plan along with follow-up of her obesity related diagnoses.    Nutrition Plan: the Category 3 plan - 0% adherence.  Current exercise: none  Interim History:  She is down 5 lbs since her last visit.  Eating all of the food on the plan., Protein intake is less than prescribed., Is not skipping meals, and Water  intake is adequate.   Pharmacotherapy: Victoria [name] is not on any medications.   Hunger is moderately controlled.  Cravings are moderately controlled. Some cravings at night.  Assessment/Plan:    Prediabetes Last A1c was 5.9  Medication(s): none Lab Results  Component Value Date   HGBA1C 5.9 (H) 01/06/2024   HGBA1C 5.6 10/17/2023   HGBA1C 6.0 07/04/2023   HGBA1C 6.1 (H) 12/20/2022   HGBA1C 5.7 (H) 07/05/2022   Lab Results  Component Value Date   INSULIN  10.0 10/17/2023   INSULIN  7.1 05/24/2022   INSULIN  5.4 02/20/2022   INSULIN  10.0 11/20/2021   INSULIN  6.2 07/07/2020    Plan: Will minimize all refined carbohydrates both sweets and starches.  Will work on the plan and exercise.  Consider both aerobic and  resistance training.  Will keep protein, water , and fiber intake high.   Hyperlipidemia LDL is not at goal. Medication(s): Jardiance , but no other medications.  Cardiovascular risk factors: dyslipidemia, obesity (BMI >= 30 kg/m2), and sedentary lifestyle  Lab Results  Component Value Date   CHOL 259 (H) 01/06/2024   HDL 97 01/06/2024   LDLCALC 146 (H) 01/06/2024   TRIG 69 01/06/2024   CHOLHDL 2.7 01/06/2024   Lab Results  Component Value Date   ALT 16 01/06/2024   AST 17 01/06/2024   ALKPHOS 73 10/17/2023   BILITOT 0.7 01/06/2024   The 10-year ASCVD risk score (Arnett DK, et al., 2019) is: 6.1%   Values used to calculate the score:     Age: 63 years     Clincally relevant sex: Female     Is Non-Hispanic African American: Yes     Diabetic: No     Tobacco smoker: No  Systolic Blood Pressure: 122 mmHg     Is BP treated: No     HDL Cholesterol: 97 mg/dL     Total Cholesterol: 259 mg/dL  Plan:  No medications.  She is scheduled with a cardiologist in the near future to discuss treatment and a plan.  Will avoid all trans fats.  Will read labels Will minimize saturated fats except the following: low fat meats in moderation, diary, and limited dark chocolate.  Will limit her water  to 64 ounces.     Morbid Obesity: Current BMI BMI (Calculated): 45.48   Pharmacotherapy Plan No anti-obesity medicatons.   Victoria [name] is currently in the action stage of change. As such, her goal is to continue with weight loss efforts.  She has agreed to the Category 3 plan.  Exercise goals: Older adults should follow the adult guidelines. When older adults cannot meet the adult guidelines, they should be as physically active as their abilities and conditions will allow.   Behavioral modification strategies: increasing lean protein intake, decreasing simple carbohydrates , no meal skipping, meal planning , increase water  intake, better snacking choices, planning for success, increasing  vegetables, avoiding temptations, keep healthy foods in the home, increase frequency of journaling, and weigh protein portions.  Victoria [name] has agreed to follow-up with our clinic in 4 weeks.   No orders of the defined types were placed in this encounter.   Medications Discontinued During This Encounter  Medication Reason   fluconazole  (DIFLUCAN ) 150 MG tablet Patient Preference         Objective:   VITALS: Per patient if applicable, see vitals. GENERAL: Alert and in no acute distress. CARDIOPULMONARY: No increased WOB. Speaking in clear sentences.  PSYCH: Pleasant and cooperative. Speech normal rate and rhythm. Affect is appropriate. Insight and judgement are appropriate. Attention is focused, linear, and appropriate.  NEURO: Oriented as arrived to appointment on time with no prompting.   Attestation Statements:   This was prepared with the assistance of Engineer, Civil (consulting).  Occasional wrong-word or sound-a-like substitutions may have occurred due to the inherent limitations of voice recognition   Clayborne Daring, DO

## 2024-01-16 ENCOUNTER — Encounter (HOSPITAL_COMMUNITY)

## 2024-01-21 ENCOUNTER — Encounter (HOSPITAL_COMMUNITY)
Admission: RE | Admit: 2024-01-21 | Discharge: 2024-01-21 | Disposition: A | Source: Ambulatory Visit | Attending: Pulmonary Disease

## 2024-01-21 ENCOUNTER — Other Ambulatory Visit (HOSPITAL_COMMUNITY): Payer: Self-pay

## 2024-01-21 ENCOUNTER — Other Ambulatory Visit: Payer: Self-pay

## 2024-01-21 VITALS — Wt 288.1 lb

## 2024-01-21 DIAGNOSIS — I5032 Chronic diastolic (congestive) heart failure: Secondary | ICD-10-CM | POA: Diagnosis not present

## 2024-01-21 NOTE — Progress Notes (Signed)
 Daily Session Note  Patient Details  Name: Victoria [name] MRN: [id number] Date of Birth: 1961-01-25 Referring Provider:   Conrad Ports Pulmonary Rehab Walk Test from 12/27/2023 in [facility] for Heart, Vascular, & Lung Health  Referring Provider Dewald    Encounter Date: 01/21/2024  Check In:  Session Check In - 01/21/24 1128       Check-In   Supervising physician immediately available to respond to emergencies [facility] MD immediately available    Physician(s) Rosabel Mose, NP    Location [facility]    Staff Present Augustin Sharps, Neita Moats, MS, ACSM-CEP, Exercise Physiologist;Mary Harvy, RN, BSN;Ziara Thelander BS, ACSM-CEP, Exercise Physiologist    Virtual Visit No    Medication changes reported     No    Fall or balance concerns reported    No    Tobacco Cessation No Change    Warm-up and Cool-down Performed as group-led instruction   only   Resistance Training Performed Yes    VAD Patient? No    PAD/SET Patient? No      Pain Assessment   Currently in Pain? No/denies    Multiple Pain Sites No          Capillary Blood Glucose: No results found. However, due to the size of the patient record, not all encounters were searched. Please check Results Review for a complete set of results.   Exercise Prescription Changes - 01/21/24 1200       Response to Exercise   Blood Pressure (Admit) 125/89    Blood Pressure (Exercise) 120/78    Blood Pressure (Exit) 100/60    Heart Rate (Admit) 74 bpm    Heart Rate (Exercise) 97 bpm    Heart Rate (Exit) 84 bpm    Oxygen Saturation (Admit) 98 %    Oxygen Saturation (Exercise) 98 %    Oxygen Saturation (Exit) 98 %    Rating of Perceived Exertion (Exercise) 11    Perceived Dyspnea (Exercise) 1    Duration Continue with 30 min of aerobic exercise without signs/symptoms of physical distress.    Intensity THRR unchanged      Progression   Progression Continue to progress workloads to  maintain intensity without signs/symptoms of physical distress.      Resistance Training   Training Prescription Yes    Weight red bands    Reps 10-15    Time 10 Minutes      Oxygen   Oxygen Continuous    Liters 2      NuStep   Level 1    SPM 78    Minutes 30    METs 1.9      Oxygen   Maintain Oxygen Saturation 88% or higher          Social History   Tobacco Use  Smoking Status Never  Smokeless Tobacco Never    Goals Met:  Exercise tolerated well No report of concerns or symptoms today Strength training completed today  Goals Unmet:  Not Applicable  Comments: Service time is from 1005 to 1155.    Dr. Slater Staff is Medical Director for Pulmonary Rehab at [facility].

## 2024-01-22 NOTE — Progress Notes (Signed)
 Pulmonary Individual Treatment Plan  Patient Details  Name: Victoria [name] MRN: [id number] Date of Birth: 12-16-1960 Referring Provider:   Conrad Ports Pulmonary Rehab Walk Test from 12/27/2023 in [facility]  Referring Provider Dewald    Initial Encounter Date:  Flowsheet Row Pulmonary Rehab Walk Test from 12/27/2023 in [facility]  Date 12/27/23    Visit Diagnosis: Heart failure, diastolic, chronic (HCC)  Patient's Home Medications on Admission:   Current Outpatient Medications:    albuterol  (VENTOLIN  HFA) 108 (90 Base) MCG/ACT inhaler, Inhale 2 puffs into the lungs every 4 hours as needed for wheezing or shortness of breath., Disp: 18 g, Rfl: 3   benzonatate  (TESSALON ) 200 MG capsule, Take 1 capsule (200 mg total) by mouth 3 (three) times daily as needed for cough., Disp: 30 capsule, Rfl: 1   budesonide  (ENTOCORT EC ) 3 MG 24 hr capsule, Take 3 capsules (9 mg total) by mouth in the morning., Disp: 270 capsule, Rfl: 3   budesonide -formoterol  (SYMBICORT ) 160-4.5 MCG/ACT inhaler, Inhale 2 puffs into the lungs 2 (two) times daily., Disp: 1 each, Rfl: 12   cyanocobalamin  (VITAMIN B12) 1000 MCG/ML injection, INJECT 1 ML INTRAMUSCULARLY ONCE A MONTH, Disp: 1 mL, Rfl: 32   empagliflozin  (JARDIANCE ) 10 MG TABS tablet, Take 1 tablet (10 mg total) by mouth daily before breakfast., Disp: 90 tablet, Rfl: 3   estradiol  (ESTRACE  VAGINAL) 0.1 MG/GM vaginal cream, Place 1 g vaginally 3 (three) times a week., Disp: 42.5 g, Rfl: 3   furosemide  (LASIX ) 40 MG tablet, Take 1 tablet (40 mg total) by mouth daily., Disp: 90 tablet, Rfl: 3   gabapentin  (NEURONTIN ) 600 MG tablet, TAKE 1 TABLET BY MOUTH THREE TIMES A DAY, Disp: 90 tablet, Rfl: 3   HYDROcodone -acetaminophen  (NORCO) 10-325 MG tablet, One tab every 6 hours as needed for pain, Disp: 20 tablet, Rfl: 0   HYRIMOZ  40 MG/0.4ML SOAJ, Inject  40 mg into the skin once a week., Disp: , Rfl:    levothyroxine  (SYNTHROID ) 75 MCG tablet, Take 1 tablet (75 mcg total) by mouth daily., Disp: 90 tablet, Rfl: 0   LORazepam  (ATIVAN ) 1 MG tablet, Take 1 tablet (1 mg total) by mouth 2 (two) times daily as needed for anxiety., Disp: 60 tablet, Rfl: 1   metoprolol  succinate (TOPROL -XL) 25 MG 24 hr tablet, Take 1.5 tablets (37.5 mg total) by mouth daily., Disp: 45 tablet, Rfl: 11   potassium chloride  (KLOR-CON  M) 10 MEQ tablet, Take 1 tablet (10 mEq total) by mouth daily. Take 1 tablet every other day with lasix  (furosemide ), Disp: 90 tablet, Rfl: 3   promethazine  (PHENERGAN ) 25 MG tablet, Take 1 tablet (25 mg total) by mouth every 8 (eight) hours as needed for nausea and/or vomiting, Disp: 30 tablet, Rfl: 5   terconazole  (TERAZOL 3 ) 0.8 % vaginal cream, Place 1 applicator vaginally at bedtime., Disp: 20 g, Rfl: 0   tiZANidine  (ZANAFLEX ) 4 MG tablet, TAKE 1 TABLET BY MOUTH 3 TIMES DAILY., Disp: 90 tablet, Rfl: 3   Ubrogepant  (UBRELVY ) 100 MG TABS, Take 1 tablet (100 mg total) by mouth every 2 (two) hours as needed. Maximum 200mg  a day., Disp: 16 tablet, Rfl: 11   Vitamin D , Ergocalciferol , (DRISDOL ) 1.25 MG (50000 UNIT) CAPS capsule, TAKE 1 CAPSULE BY MOUTH ONE TIME PER WEEK, Disp: 4 capsule, Rfl: 2 No current facility-administered medications for this encounter.  Facility-Administered Medications Ordered in Other Encounters:  gadobenate dimeglumine  (MULTIHANCE ) injection 20 mL, 20 mL, Intravenous, Once PRN, Ines Onetha NOVAK, MD  Past Medical History: Past Medical History:  Diagnosis Date   Allergy  approx-5years ago   estradiol ,topical patch only-rash   Anxiety    Back pain    Blood transfusion without reported diagnosis 2010   after hip replacement   Bursitis    right shoulder   Cataract September 2019   bilateral, lasik   Chronic diastolic heart failure (HCC)    Chronic kidney disease    hematuria, has been worked up, her normal    Chronic leukopenia    intermittant since 2010, followed by Dr. Perri now   Chronic neck pain    Constipation    Degenerative joint disease of low back 09/02/2015   Dr. Hiram   Diastolic heart failure [facility])    Displacement of lumbar intervertebral disc    Dysrhythmia    Tachycardia, PVCs, PACs   Edema of both lower legs    Fibrocystic breast    Gallbladder problem    GERD (gastroesophageal reflux disease)    Headache    per pt more stress/tension   Heart palpitations    SEE EPIC ENCOUTNER , CARDIOLOGY DR. WILBERT TURNER 2018; reports on 05-16-17  i haven't had any bouts of those lately     Hemorrhoids    Hidradenitis suppurativa    History of Clostridium difficile infection 12/2010   History of exercise intolerance    ETT on 04-27-2016-- negative ([facility] treadmill score 7)   History of Helicobacter pylori infection 2001 and 09/ 2012   HSV-2 infection    genital   Hx of adenomatous colonic polyps 10/17/2007   Hydradenitis    per pt currently treated with Humira  injection   Hypertension    Hyperthyroidism    Hypocupremia    Hypothyroidism    Hypothyroidism, postsurgical 1980   IBS (irritable bowel syndrome)    Insomnia    Iron  deficiency    Joint pain    Leukopenia    Lumbosacral radiculopathy    Lymphocytic colitis 05/14/2022   dx from colonoscopy   Medial meniscus tear    right knee   Mild obstructive sleep apnea    Mitral regurgitation    Mild by echo 06/2023   Numbness and tingling of right arm    Obesity    Osteoarthritis    Palpitations    Pernicious anemia    b12 def   Peroneal neuropathy    PONV (postoperative nausea and vomiting)    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   Prediabetes    Pulmonary hypertension (HCC)    Reactive hypoglycemia followed by pcp   post gastrectomy dumping syndrome   SOB (shortness of breath)    Spinal stenosis    Tendonitis    right shoulder   Tricuspid regurgitation    Mild to moderate echo 06/2023   Varicose veins     Vertigo    Vitamin B 12 deficiency    Vitamin D  deficiency     Tobacco Use: Social History   Tobacco Use  Smoking Status Never  Smokeless Tobacco Never    Labs: Review Flowsheet  More data exists      Latest Ref Rng & Units 12/20/2022 07/04/2023 10/17/2023 11/15/2023 01/06/2024  Labs for ITP Cardiac and Pulmonary Rehab  Cholestrol <200 mg/dL 796  779  775  - 740   LDL (calc) mg/dL (calc) 893  883  878  - 146   HDL-C > OR =  50 mg/dL 83  10.59  92  - 97   Trlycerides <150 mg/dL 62  26.9  64  - 69   Hemoglobin A1c <5.7 % 6.1  6.0  5.6  - 5.9   Bicarbonate 20.0 - 28.0 mmol/L - - - 24.0  23.9  -  TCO2 22 - 32 mmol/L - - - 25  25  -  Acid-base deficit 0.0 - 2.0 mmol/L - - - 1.0  1.0  -  O2 Saturation % - - - 75  76  -    Details       Multiple values from one day are sorted in reverse-chronological order         Capillary Blood Glucose: Lab Results  Component Value Date   GLUCAP 251 (H) 07/16/2022   GLUCAP 109 (H) 07/02/2022     Pulmonary Assessment Scores:  Pulmonary Assessment Scores     Row Name 12/27/23 1128         ADL UCSD   ADL Phase Entry     SOB Score total 90       CAT Score   CAT Score 26       mMRC Score   mMRC Score 4       UCSD: Self-administered rating of dyspnea associated with activities of daily living (ADLs) 6-point scale (0 = not at all to 5 = maximal or unable to do because of breathlessness)  Scoring Scores range from 0 to 120.  Minimally important difference is 5 units  CAT: CAT can identify the health impairment of COPD patients and is better correlated with disease progression.  CAT has a scoring range of zero to 40. The CAT score is classified into four groups of low (less than 10), medium (10 - 20), high (21-30) and very high (31-40) based on the impact level of disease on health status. A CAT score over 10 suggests significant symptoms.  A worsening CAT score could be explained by an exacerbation, poor medication  adherence, poor inhaler technique, or progression of COPD or comorbid conditions.  CAT MCID is 2 points  mMRC: mMRC (Modified Medical Research Council) Dyspnea Scale is used to assess the degree of baseline functional disability in patients of respiratory disease due to dyspnea. No minimal important difference is established. A decrease in score of 1 point or greater is considered a positive change.   Pulmonary Function Assessment:  Pulmonary Function Assessment - 12/27/23 1212       Breath   Bilateral Breath Sounds Clear    Shortness of Breath Yes;Fear of Shortness of Breath;Limiting activity          Exercise Target Goals: Exercise Program Goal: Individual exercise prescription set using results from initial 6 min walk test and THRR while considering  patient's activity barriers and safety.   Exercise Prescription Goal: Initial exercise prescription builds to 30-45 minutes a day of aerobic activity, 2-3 days per week.  Home exercise guidelines will be given to patient during program as part of exercise prescription that the participant will acknowledge.  Activity Barriers & Risk Stratification:  Activity Barriers & Cardiac Risk Stratification - 12/27/23 1117       Activity Barriers & Cardiac Risk Stratification   Activity Barriers Deconditioning;Muscular Weakness;Shortness of Breath;Left Knee Replacement;Right Knee Replacement;Back Problems          6 Minute Walk:  6 Minute Walk     Row Name 12/27/23 1241         6 Minute Walk  Phase Initial     Distance 645 feet     Walk Time 6 minutes     # of Rest Breaks 1  1:16-2:20     MPH 1.22     METS 0.81     RPE 15     Perceived Dyspnea  2     VO2 Peak 2.85     Symptoms No     Resting HR 62 bpm     Resting BP 112/62     Resting Oxygen Saturation  98 %     Exercise Oxygen Saturation  during 6 min walk 86 %     Max Ex. HR 64 bpm     Max Ex. BP 126/86     2 Minute Post BP 111/84  automatic       Interval HR   1  Minute HR 78     2 Minute HR 75     3 Minute HR 71     4 Minute HR 78     5 Minute HR 74     6 Minute HR 72     2 Minute Post HR 64     Interval Heart Rate? Yes       Interval Oxygen   Interval Oxygen? Yes     Baseline Oxygen Saturation % 98 %     1 Minute Oxygen Saturation % 93 %     1 Minute Liters of Oxygen 0 L     2 Minute Oxygen Saturation % 96 %     2 Minute Liters of Oxygen 0 L     3 Minute Oxygen Saturation % 92 %     3 Minute Liters of Oxygen 0 L     4 Minute Oxygen Saturation % 86 %     4 Minute Liters of Oxygen 0 L  placed on 2L     5 Minute Oxygen Saturation % 98 %     5 Minute Liters of Oxygen 2 L     6 Minute Oxygen Saturation % 93 %     6 Minute Liters of Oxygen 2 L     2 Minute Post Oxygen Saturation % 100 %     2 Minute Post Liters of Oxygen 2 L        Oxygen Initial Assessment:  Oxygen Initial Assessment - 12/27/23 1119       Home Oxygen   Home Oxygen Device Home Concentrator;Portable Concentrator    Sleep Oxygen Prescription CPAP    Liters per minute 0    Home Exercise Oxygen Prescription Continuous    Liters per minute 2    Home Resting Oxygen Prescription None    Compliance with Home Oxygen Use Yes      Intervention   Short Term Goals To learn and exhibit compliance with exercise, home and travel O2 prescription;To learn and understand importance of maintaining oxygen saturations>88%;To learn and demonstrate proper use of respiratory medications;To learn and demonstrate proper pursed lip breathing techniques or other breathing techniques. ;To learn and understand importance of monitoring SPO2 with pulse oximeter and demonstrate accurate use of the pulse oximeter.    Long  Term Goals Maintenance of O2 saturations>88%;Exhibits compliance with exercise, home  and travel O2 prescription;Exhibits proper breathing techniques, such as pursed lip breathing or other method taught during program session;Verbalizes importance of monitoring SPO2 with pulse  oximeter and return demonstration;Compliance with respiratory medication;Demonstrates proper use of MDI's  Oxygen Re-Evaluation:  Oxygen Re-Evaluation     Row Name 12/27/23 1119 01/17/24 0950           Program Oxygen Prescription   Program Oxygen Prescription Continuous Continuous      Liters per minute 2 2        Home Oxygen   Home Oxygen Device -- Home Concentrator;Portable Concentrator      Sleep Oxygen Prescription -- CPAP      Liters per minute -- 0      Home Exercise Oxygen Prescription -- Continuous      Liters per minute -- 2      Home Resting Oxygen Prescription -- None      Compliance with Home Oxygen Use -- Yes        Goals/Expected Outcomes   Short Term Goals -- To learn and exhibit compliance with exercise, home and travel O2 prescription;To learn and understand importance of maintaining oxygen saturations>88%;To learn and demonstrate proper use of respiratory medications;To learn and demonstrate proper pursed lip breathing techniques or other breathing techniques. ;To learn and understand importance of monitoring SPO2 with pulse oximeter and demonstrate accurate use of the pulse oximeter.      Long  Term Goals -- Maintenance of O2 saturations>88%;Exhibits compliance with exercise, home  and travel O2 prescription;Exhibits proper breathing techniques, such as pursed lip breathing or other method taught during program session;Verbalizes importance of monitoring SPO2 with pulse oximeter and return demonstration;Compliance with respiratory medication;Demonstrates proper use of MDI's      Goals/Expected Outcomes -- Compliance and understanding of oxygen saturation monitoring and breathing techniques to decrease shortness of breath.         Oxygen Discharge (Final Oxygen Re-Evaluation):  Oxygen Re-Evaluation - 01/17/24 0950       Program Oxygen Prescription   Program Oxygen Prescription Continuous    Liters per minute 2      Home Oxygen   Home Oxygen Device  Home Concentrator;Portable Concentrator    Sleep Oxygen Prescription CPAP    Liters per minute 0    Home Exercise Oxygen Prescription Continuous    Liters per minute 2    Home Resting Oxygen Prescription None    Compliance with Home Oxygen Use Yes      Goals/Expected Outcomes   Short Term Goals To learn and exhibit compliance with exercise, home and travel O2 prescription;To learn and understand importance of maintaining oxygen saturations>88%;To learn and demonstrate proper use of respiratory medications;To learn and demonstrate proper pursed lip breathing techniques or other breathing techniques. ;To learn and understand importance of monitoring SPO2 with pulse oximeter and demonstrate accurate use of the pulse oximeter.    Long  Term Goals Maintenance of O2 saturations>88%;Exhibits compliance with exercise, home  and travel O2 prescription;Exhibits proper breathing techniques, such as pursed lip breathing or other method taught during program session;Verbalizes importance of monitoring SPO2 with pulse oximeter and return demonstration;Compliance with respiratory medication;Demonstrates proper use of MDI's    Goals/Expected Outcomes Compliance and understanding of oxygen saturation monitoring and breathing techniques to decrease shortness of breath.          Initial Exercise Prescription:  Initial Exercise Prescription - 12/27/23 1200       Date of Initial Exercise RX and Referring Provider   Date 12/27/23    Referring Provider Dewald    Expected Discharge Date 03/31/24      Oxygen   Oxygen Continuous    Liters 2    Maintain Oxygen Saturation 88% or higher  NuStep   Level 1    SPM 55    Minutes 30    METs 1.4      Prescription Details   Frequency (times per week) 2    Duration Progress to 30 minutes of continuous aerobic without signs/symptoms of physical distress      Intensity   THRR 40-80% of Max Heartrate 63-126    Ratings of Perceived Exertion 11-13    Perceived  Dyspnea 0-4      Progression   Progression Continue progressive overload as per policy without signs/symptoms or physical distress.      Resistance Training   Training Prescription Yes    Weight red bands    Reps 10-15          Perform Capillary Blood Glucose checks as needed.  Exercise Prescription Changes:   Exercise Prescription Changes     Row Name 01/07/24 1100 01/21/24 1200           Response to Exercise   Blood Pressure (Admit) 104/70 125/89      Blood Pressure (Exercise) 124/80 120/78      Blood Pressure (Exit) 104/80 100/60      Heart Rate (Admit) 74 bpm 74 bpm      Heart Rate (Exercise) 80 bpm 97 bpm      Heart Rate (Exit) 73 bpm 84 bpm      Oxygen Saturation (Admit) 100 %  2L 98 %      Oxygen Saturation (Exercise) 97 %  2L 98 %      Oxygen Saturation (Exit) 100 %  2L 98 %      Rating of Perceived Exertion (Exercise) 11 11      Perceived Dyspnea (Exercise) 1 1      Duration Continue with 30 min of aerobic exercise without signs/symptoms of physical distress. Continue with 30 min of aerobic exercise without signs/symptoms of physical distress.      Intensity THRR unchanged THRR unchanged        Progression   Progression Continue to progress workloads to maintain intensity without signs/symptoms of physical distress. Continue to progress workloads to maintain intensity without signs/symptoms of physical distress.        Resistance Training   Training Prescription Yes Yes      Weight red bands red bands      Reps 10-15 10-15      Time 10 Minutes 10 Minutes        Oxygen   Oxygen Continuous Continuous      Liters 2 2        NuStep   Level 1 1      SPM -- 78      Minutes 30 30      METs 1.7 1.9        Oxygen   Maintain Oxygen Saturation 88% or higher 88% or higher         Exercise Comments:   Exercise Comments     Row Name 01/17/24 0950           Exercise Comments Pt completed 4th day of exercise. She exercises for 30 min on the Nustep  averaging 1.9 METs. Almeter performs the warmup and cooldown standing holding onto a chair for balance. Discussed METs.          Exercise Goals and Review:   Exercise Goals     Row Name 12/27/23 1119             Exercise Goals  Increase Physical Activity Yes       Intervention Provide advice, education, support and counseling about physical activity/exercise needs.;Develop an individualized exercise prescription for aerobic and resistive training based on initial evaluation findings, risk stratification, comorbidities and participant's personal goals.       Expected Outcomes Short Term: Attend rehab on a regular basis to increase amount of physical activity.;Long Term: Exercising regularly at least 3-5 days a week.;Long Term: Add in home exercise to make exercise part of routine and to increase amount of physical activity.       Increase Strength and Stamina Yes       Intervention Provide advice, education, support and counseling about physical activity/exercise needs.;Develop an individualized exercise prescription for aerobic and resistive training based on initial evaluation findings, risk stratification, comorbidities and participant's personal goals.       Expected Outcomes Short Term: Increase workloads from initial exercise prescription for resistance, speed, and METs.;Short Term: Perform resistance training exercises routinely during rehab and add in resistance training at home;Long Term: Improve cardiorespiratory fitness, muscular endurance and strength as measured by increased METs and functional capacity ( )       Able to understand and use rate of perceived exertion (RPE) scale Yes       Intervention Provide education and explanation on how to use RPE scale       Expected Outcomes Short Term: Able to use RPE daily in rehab to express subjective intensity level;Long Term:  Able to use RPE to guide intensity level when exercising independently       Able to understand and use  Dyspnea scale Yes       Intervention Provide education and explanation on how to use Dyspnea scale       Expected Outcomes Short Term: Able to use Dyspnea scale daily in rehab to express subjective sense of shortness of breath during exertion;Long Term: Able to use Dyspnea scale to guide intensity level when exercising independently       Knowledge and understanding of Target Heart Rate Range (THRR) Yes       Intervention Provide education and explanation of THRR including how the numbers were predicted and where they are located for reference       Expected Outcomes Short Term: Able to state/look up THRR;Short Term: Able to use daily as guideline for intensity in rehab;Long Term: Able to use THRR to govern intensity when exercising independently       Understanding of Exercise Prescription Yes       Intervention Provide education, explanation, and written materials on patient's individual exercise prescription       Expected Outcomes Short Term: Able to explain program exercise prescription;Long Term: Able to explain home exercise prescription to exercise independently          Exercise Goals Re-Evaluation :  Exercise Goals Re-Evaluation     Row Name 01/17/24 0951             Exercise Goal Re-Evaluation   Exercise Goals Review Increase Physical Activity;Able to understand and use Dyspnea scale;Understanding of Exercise Prescription;Increase Strength and Stamina;Knowledge and understanding of Target Heart Rate Range (THRR);Able to understand and use rate of perceived exertion (RPE) scale       Comments Ileane has completed 4 exercise sessions. She exercises for 30 min on the Nustep. She averages 1.9 METs at level 1 on the Nustep. Trenae performs the warmup and cooldown standing holding onto a chair for balance. It is too soon to notate any discernable  progressions. Will continue to monitor and progress as able.       Expected Outcomes Through exercise at rehab and home, the patient will  decrease shortness of breath with daily activities and feel confident in carrying out an exercise regimen at home.          Discharge Exercise Prescription (Final Exercise Prescription Changes):  Exercise Prescription Changes - 01/21/24 1200       Response to Exercise   Blood Pressure (Admit) 125/89    Blood Pressure (Exercise) 120/78    Blood Pressure (Exit) 100/60    Heart Rate (Admit) 74 bpm    Heart Rate (Exercise) 97 bpm    Heart Rate (Exit) 84 bpm    Oxygen Saturation (Admit) 98 %    Oxygen Saturation (Exercise) 98 %    Oxygen Saturation (Exit) 98 %    Rating of Perceived Exertion (Exercise) 11    Perceived Dyspnea (Exercise) 1    Duration Continue with 30 min of aerobic exercise without signs/symptoms of physical distress.    Intensity THRR unchanged      Progression   Progression Continue to progress workloads to maintain intensity without signs/symptoms of physical distress.      Resistance Training   Training Prescription Yes    Weight red bands    Reps 10-15    Time 10 Minutes      Oxygen   Oxygen Continuous    Liters 2      NuStep   Level 1    SPM 78    Minutes 30    METs 1.9      Oxygen   Maintain Oxygen Saturation 88% or higher          Nutrition:  Target Goals: Understanding of nutrition guidelines, daily intake of sodium 1500mg , cholesterol 200mg , calories 30% from fat and 7% or less from saturated fats, daily to have 5 or more servings of fruits and vegetables.  Biometrics:    Nutrition Therapy Plan and Nutrition Goals:  Nutrition Therapy & Goals - 01/07/24 1117       Nutrition Therapy   Diet Low Sodium      Personal Nutrition Goals   Nutrition Goal Patient to identify strategies for weight loss with goal of 0.5-2 # per week of weight loss.    Personal Goal #2 Patient to limit sodium intake to 1500 mg/day.    Personal Goal #3 Patient to improve diet quality by using the plate method as a guide for meal planning to include lean  protein/plant protein, fruits, vegetables, whole grains, nonfat dairy as part of a well-balanced diet.    Comments Pt with history of pulmonary hypertension with chronic hypoxemia and diastolic heart failure. PMH also significant for CKD, pre-diabetes, s/p gastric bypass, and lymphocytic colitis. Pt reports being instructed to consume 90 g protein daily by MD; also instructed to avoid artificial sweeteners as may exacerbate colitis symptoms. Pt notes difficulty consuming adequate protein due to inability to consume foods with artificial sweeteners such as light yogurt and protein shakes such as Premier. RD provided list of lean protein sources as well as suggestions for alternative protein shakes and smoothie recipes. Pt notes receiving instruction to follow low sodium diet; often prepares meals in home.      Intervention Plan   Intervention Prescribe, educate and counsel regarding individualized specific dietary modifications aiming towards targeted core components such as weight, hypertension, lipid management, diabetes, heart failure and other comorbidities.;Nutrition handout(s) given to patient.   Handouts:  High Protein Food List, Weight Loss Considerations   Expected Outcomes Short Term Goal: Understand basic principles of dietary content, such as calories, fat, sodium, cholesterol and nutrients.;Long Term Goal: Adherence to prescribed nutrition plan.          Nutrition Assessments:  MEDIFICTS Score Key: >=70 Need to make dietary changes  40-70 Heart Healthy Diet <= 40 Therapeutic Level Cholesterol Diet   Picture Your Plate Scores: <59 Unhealthy dietary pattern with much room for improvement. 41-50 Dietary pattern unlikely to meet recommendations for good health and room for improvement. 51-60 More healthful dietary pattern, with some room for improvement.  >60 Healthy dietary pattern, although there may be some specific behaviors that could be improved.    Nutrition Goals  Re-Evaluation:   Nutrition Goals Discharge (Final Nutrition Goals Re-Evaluation):   Psychosocial: Target Goals: Acknowledge presence or absence of significant depression and/or stress, maximize coping skills, provide positive support system. Participant is able to verbalize types and ability to use techniques and skills needed for reducing stress and depression.  Initial Review & Psychosocial Screening:  Initial Psych Review & Screening - 12/27/23 1121       Initial Review   Current issues with Current Stress Concerns    Source of Stress Concerns Chronic Illness    Comments Pt down about chronic shortness of breath that limits her activity      Family Dynamics   Good Support System? Yes    Comments States she has good support from husband and family      Barriers   Psychosocial barriers to participate in program Psychosocial barriers identified (see note)      Screening Interventions   Interventions Encouraged to exercise;Provide feedback about the scores to participant    Expected Outcomes Long Term Goal: Stressors or current issues are controlled or eliminated.;Long Term goal: The participant improves quality of Life and PHQ9 Scores as seen by post scores and/or verbalization of changes;Short Term goal: Identification and review with participant of any Quality of Life or Depression concerns found by scoring the questionnaire.;Short Term goal: Utilizing psychosocial counselor, staff and physician to assist with identification of specific Stressors or current issues interfering with healing process. Setting desired goal for each stressor or current issue identified.          Quality of Life Scores:  Scores of 19 and below usually indicate a poorer quality of life in these areas.  A difference of  2-3 points is a clinically meaningful difference.  A difference of 2-3 points in the total score of the Quality of Life Index has been associated with significant improvement in overall  quality of life, self-image, physical symptoms, and general health in studies assessing change in quality of life.  PHQ-9: Review Flowsheet  More data exists      12/27/2023 12/26/2023 07/08/2023 02/12/2023 12/04/2022  Depression screen PHQ 2/9  Decreased Interest 2 1 2 3 3   Down, Depressed, Hopeless 1 1 1 2 3   PHQ - 2 Score 3 2 3 5 6   Altered sleeping 2 3 3 3 3   Tired, decreased energy 3 3 3 3 3   Change in appetite 2 2 3 3 3   Feeling bad or failure about yourself  0 0 0 2 2  Trouble concentrating 0 1 0 3 0  Moving slowly or fidgety/restless 0 1 3 3 3   Suicidal thoughts 0 0 0 0 0  PHQ-9 Score 10  12  15  22  20    Difficult doing work/chores  Somewhat difficult Somewhat difficult Very difficult - Not difficult at all    Details       Data saved with a previous flowsheet row definition        Interpretation of Total Score  Total Score Depression Severity:  1-4 = Minimal depression, 5-9 = Mild depression, 10-14 = Moderate depression, 15-19 = Moderately severe depression, 20-27 = Severe depression   Psychosocial Evaluation and Intervention:  Psychosocial Evaluation - 12/27/23 1122       Psychosocial Evaluation & Interventions   Interventions Encouraged to exercise with the program and follow exercise prescription    Comments Kenza states she is stressed about her health and is hot that cardiology did not escalute her care and concerns sooner    Expected Outcomes For Valarie to participate in Pulm Rehab with decreased health related stress and SOB    Continue Psychosocial Services  Follow up required by staff          Psychosocial Re-Evaluation:  Psychosocial Re-Evaluation     Row Name 01/15/24 1115             Psychosocial Re-Evaluation   Current issues with Current Stress Concerns       Comments Monthly psy/soc re-eval is as follows: Elda denies any new psy/soc barriers or concerns at this time. Staff will provide Yuval with education on healthy ways to reduce  stress. No needs at this time.       Expected Outcomes For Dania to participate in PR with less stress.       Interventions Encouraged to attend Pulmonary Rehabilitation for the exercise       Continue Psychosocial Services  Follow up required by staff          Psychosocial Discharge (Final Psychosocial Re-Evaluation):  Psychosocial Re-Evaluation - 01/15/24 1115       Psychosocial Re-Evaluation   Current issues with Current Stress Concerns    Comments Monthly psy/soc re-eval is as follows: Anastasia denies any new psy/soc barriers or concerns at this time. Staff will provide Shakthi with education on healthy ways to reduce stress. No needs at this time.    Expected Outcomes For Kimiko to participate in PR with less stress.    Interventions Encouraged to attend Pulmonary Rehabilitation for the exercise    Continue Psychosocial Services  Follow up required by staff          Education: Education Goals: Education classes will be provided on a weekly basis, covering required topics. Participant will state understanding/return demonstration of topics presented.  Learning Barriers/Preferences:  Learning Barriers/Preferences - 12/27/23 1125       Learning Barriers/Preferences   Learning Barriers None    Learning Preferences None          Education Topics: Know Your Numbers Group instruction that is supported by a PowerPoint presentation. Instructor discusses importance of knowing and understanding resting, exercise, and post-exercise oxygen saturation, heart rate, and blood pressure. Oxygen saturation, heart rate, blood pressure, rating of perceived exertion, and dyspnea are reviewed along with a normal range for these values.    Exercise for the Pulmonary Patient Group instruction that is supported by a PowerPoint presentation. Instructor discusses benefits of exercise, core components of exercise, frequency, duration, and intensity of an exercise routine, importance of utilizing  pulse oximetry during exercise, safety while exercising, and options of places to exercise outside of rehab.    MET Level  Group instruction provided by PowerPoint, verbal discussion, and written material to support subject matter.  Instructor reviews what METs are and how to increase METs.  Flowsheet Row PULMONARY REHAB OTHER RESPIRATORY from 01/02/2024 in [facility]  Date 01/02/24  Educator EP  Instruction Review Code 1- Verbalizes Understanding    Pulmonary Medications Verbally interactive group education provided by instructor with focus on inhaled medications and proper administration.   Anatomy and Physiology of the Respiratory System Group instruction provided by PowerPoint, verbal discussion, and written material to support subject matter. Instructor reviews respiratory cycle and anatomical components of the respiratory system and their functions. Instructor also reviews differences in obstructive and restrictive respiratory diseases with examples of each.    Oxygen Safety Group instruction provided by PowerPoint, verbal discussion, and written material to support subject matter. There is an overview of "What is Oxygen" and "Why do we need it".  Instructor also reviews how to create a safe environment for oxygen use, the importance of using oxygen as prescribed, and the risks of noncompliance. There is a brief discussion on traveling with oxygen and resources the patient may utilize.   Oxygen Use Group instruction provided by PowerPoint, verbal discussion, and written material to discuss how supplemental oxygen is prescribed and different types of oxygen supply systems. Resources for more information are provided.    Breathing Techniques Group instruction that is supported by demonstration and informational handouts. Instructor discusses the benefits of pursed lip and diaphragmatic breathing and detailed demonstration on  how to perform both.     Risk Factor Reduction Group instruction that is supported by a PowerPoint presentation. Instructor discusses the definition of a risk factor, different risk factors for pulmonary disease, and how the heart and lungs work together.   Pulmonary Diseases Group instruction provided by PowerPoint, verbal discussion, and written material to support subject matter. Instructor gives an overview of the different type of pulmonary diseases. There is also a discussion on risk factors and symptoms as well as ways to manage the diseases. Flowsheet Row PULMONARY REHAB OTHER RESPIRATORY from 01/09/2024 in [facility]  Date 01/09/24  Educator RT  Instruction Review Code 1- Verbalizes Understanding    Stress and Energy Conservation Group instruction provided by PowerPoint, verbal discussion, and written material to support subject matter. Instructor gives an overview of stress and the impact it can have on the body. Instructor also reviews ways to reduce stress. There is also a discussion on energy conservation and ways to conserve energy throughout the day.   Warning Signs and Symptoms Group instruction provided by PowerPoint, verbal discussion, and written material to support subject matter. Instructor reviews warning signs and symptoms of stroke, heart attack, cold and flu. Instructor also reviews ways to prevent the spread of infection.   Other Education Group or individual verbal, written, or video instructions that support the educational goals of the pulmonary rehab program.    Knowledge Questionnaire Score:  Knowledge Questionnaire Score - 12/27/23 1214       Knowledge Questionnaire Score   Pre Score 17/18          Core Components/Risk Factors/Patient Goals at Admission:  Personal Goals and Risk Factors at Admission - 12/27/23 1125       Core Components/Risk Factors/Patient Goals on Admission    Improve shortness of breath with ADL's Yes    Intervention Provide education, individualized exercise plan and daily activity instruction to help decrease symptoms of SOB with activities of daily living.  Expected Outcomes Short Term: Improve cardiorespiratory fitness to achieve a reduction of symptoms when performing ADLs;Long Term: Be able to perform more ADLs without symptoms or delay the onset of symptoms    Heart Failure Yes    Intervention Provide a combined exercise and nutrition program that is supplemented with education, support and counseling about heart failure. Directed toward relieving symptoms such as shortness of breath, decreased exercise tolerance, and extremity edema.    Expected Outcomes Improve functional capacity of life;Short term: Attendance in program 2-3 days a week with increased exercise capacity. Reported lower sodium intake. Reported increased fruit and vegetable intake. Reports medication compliance.;Short term: Daily weights obtained and reported for increase. Utilizing diuretic protocols set by physician.;Long term: Adoption of self-care skills and reduction of barriers for early signs and symptoms recognition and intervention leading to self-care maintenance.          Core Components/Risk Factors/Patient Goals Review:   Goals and Risk Factor Review     Row Name 01/15/24 1117             Core Components/Risk Factors/Patient Goals Review   Personal Goals Review Improve shortness of breath with ADL's;Develop more efficient breathing techniques such as purse lipped breathing and diaphragmatic breathing and practicing self-pacing with activity.;Heart Failure       Review Monthly review of patient's Core Components/Risk Factors/Patient Goals are as follows: Goal progressing for improving shortness of breath. Neko is currently exercising on 2L to keep sats > 88%. She is currently exercising on the Nustep for 30 minutes. Goal progressing for developing more  efficient breathing techniques such as purse lipped breathing and diaphragmatic breathing; and practicing self-pacing with activity.  Goal progressing for heart failure. We will continue to monitor her progress throughout the program.       Expected Outcomes To improve shortness of breath with ADL's, have no heart failure exacerbations and develop more efficient breathing techniques such as purse lipped breathing and diaphragmatic breathing; and practicing self-pacing with activity          Core Components/Risk Factors/Patient Goals at Discharge (Final Review):   Goals and Risk Factor Review - 01/15/24 1117       Core Components/Risk Factors/Patient Goals Review   Personal Goals Review Improve shortness of breath with ADL's;Develop more efficient breathing techniques such as purse lipped breathing and diaphragmatic breathing and practicing self-pacing with activity.;Heart Failure    Review Monthly review of patient's Core Components/Risk Factors/Patient Goals are as follows: Goal progressing for improving shortness of breath. Mela is currently exercising on 2L to keep sats > 88%. She is currently exercising on the Nustep for 30 minutes. Goal progressing for developing more efficient breathing techniques such as purse lipped breathing and diaphragmatic breathing; and practicing self-pacing with activity.  Goal progressing for heart failure. We will continue to monitor her progress throughout the program.    Expected Outcomes To improve shortness of breath with ADL's, have no heart failure exacerbations and develop more efficient breathing techniques such as purse lipped breathing and diaphragmatic breathing; and practicing self-pacing with activity          ITP Comments: Pt is making expected progress toward Pulmonary Rehab goals after completing 5 session(s). Recommend continued exercise, life style modification, education, and utilization of breathing techniques to increase stamina and  strength, while also decreasing shortness of breath with exertion.  Dr. Slater Staff is Medical Director for Pulmonary Rehab at [facility].

## 2024-01-27 ENCOUNTER — Ambulatory Visit: Admitting: Obstetrics and Gynecology

## 2024-01-27 ENCOUNTER — Encounter: Payer: Self-pay | Admitting: Obstetrics and Gynecology

## 2024-01-27 VITALS — BP 100/62 | HR 74 | Temp 97.7°F | Wt 294.0 lb

## 2024-01-27 DIAGNOSIS — B372 Candidiasis of skin and nail: Secondary | ICD-10-CM

## 2024-01-27 DIAGNOSIS — N760 Acute vaginitis: Secondary | ICD-10-CM | POA: Diagnosis not present

## 2024-01-27 DIAGNOSIS — B3731 Acute candidiasis of vulva and vagina: Secondary | ICD-10-CM

## 2024-01-27 DIAGNOSIS — B9689 Other specified bacterial agents as the cause of diseases classified elsewhere: Secondary | ICD-10-CM

## 2024-01-27 LAB — WET PREP FOR TRICH, YEAST, CLUE

## 2024-01-27 MED ORDER — METRONIDAZOLE 0.75 % VA GEL: 1.0000 | Freq: Every day | VAGINAL | 0 refills | Status: AC

## 2024-01-27 MED ORDER — NYSTATIN 100000 UNIT/GM EX POWD: 1.0000 | Freq: Two times a day (BID) | CUTANEOUS | 1 refills | Status: AC

## 2024-01-27 NOTE — Progress Notes (Signed)
 63 y.o. [id number] female s/p hysterectomy here for vaginitis. Married.  No LMP recorded. Patient has had a hysterectomy.   She reports recently diagnosed with BV and yeast infection, still having vaginal itching. Symptoms have been present for 4 days.  Treated 01/06/24 for yeast and BV Diflucan  x2 and flagyl , then terconazole   on 11/21.  Started jardiance  in October.  Urine sample provided: Yes  Sexually active: No    OB History  Gravida Para Term Preterm AB Living  2 1  1 1 2   SAB IAB Ectopic Multiple Live Births   1  1 2     # Outcome Date GA Lbr Len/2nd Weight Sex Type Anes PTL Lv  2A Preterm 07/15/83    F CS-LTranv   LIV  2B Preterm 07/15/83    F    LIV  1 IAB            Past Medical History:  Diagnosis Date   Allergy  approx-5years ago   estradiol ,topical patch only-rash   Anxiety    Back pain    Blood transfusion without reported diagnosis 2010   after hip replacement   Bursitis    right shoulder   Cataract September 2019   bilateral, lasik   Chronic diastolic heart failure (HCC)    Chronic kidney disease    hematuria, has been worked up, her normal   Chronic leukopenia    intermittant since 2010, followed by Dr. Perri now   Chronic neck pain    Constipation    Degenerative joint disease of low back 09/02/2015   Dr. Hiram   Diastolic heart failure [facility])    Displacement of lumbar intervertebral disc    Dysrhythmia    Tachycardia, PVCs, PACs   Edema of both lower legs    Fibrocystic breast    Gallbladder problem    GERD (gastroesophageal reflux disease)    Headache    per pt more stress/tension   Heart palpitations    SEE EPIC ENCOUTNER , CARDIOLOGY DR. WILBERT TURNER 2018; reports on 05-16-17  i haven't had any bouts of those lately     Hemorrhoids    Hidradenitis suppurativa    History of Clostridium difficile infection 12/2010   History of exercise intolerance    ETT on 04-27-2016-- negative ([facility] treadmill score 7)   History of Helicobacter  pylori infection 2001 and 09/ 2012   HSV-2 infection    genital   Hx of adenomatous colonic polyps 10/17/2007   Hydradenitis    per pt currently treated with Humira  injection   Hypertension    Hyperthyroidism    Hypocupremia    Hypothyroidism    Hypothyroidism, postsurgical 1980   IBS (irritable bowel syndrome)    Insomnia    Iron  deficiency    Joint pain    Leukopenia    Lumbosacral radiculopathy    Lymphocytic colitis 05/14/2022   dx from colonoscopy   Medial meniscus tear    right knee   Mild obstructive sleep apnea    Mitral regurgitation    Mild by echo 06/2023   Numbness and tingling of right arm    Obesity    Osteoarthritis    Palpitations    Pernicious anemia    b12 def   Peroneal neuropathy    PONV (postoperative nausea and vomiting)    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   Prediabetes    Pulmonary hypertension (HCC)    Reactive hypoglycemia followed by pcp   post gastrectomy dumping syndrome  SOB (shortness of breath)    Spinal stenosis    Tendonitis    right shoulder   Tricuspid regurgitation    Mild to moderate echo 06/2023   Varicose veins    Vertigo    Vitamin B 12 deficiency    Vitamin D  deficiency    Past Surgical History:  Procedure Laterality Date   BREATH TEK H PYLORI N/A 05/03/2014   Procedure: BREATH TEK H PYLORI;  Surgeon: Adina Lunger, MD;  Location: [facility];  Service: General;  Laterality: N/A;   [place]  07/2023   urethral injections for stress incontinence   CARDIOVASCULAR STRESS TEST  03/11/2013   dr wilbert turner   Low risk nuclear study w/ a very small anterior perfusion defect that most likely represents breast attenuation artifact, less likely this could represent a true perfusion abnormality in the distribution of diagonal branch of the LAD/  normal LV function and wall motion , ef 66%   CATARACT EXTRACTION W/ INTRAOCULAR LENS  IMPLANT, BILATERAL  10/2017   CESAREAN SECTION  1985   CHOLECYSTECTOMY OPEN  1987    COLONOSCOPY  02/2017   Dr Kristie ; per patient they found 7 polyps with polypectomy; on 5 year track    ESOPHAGOGASTRODUODENOSCOPY  02/01/2021   EYE SURGERY  September 2019   cataracts,lasik   FINE NEEDLE ASPIRATION Left 05/22/2017   Procedure: LEFT KNEE ASPIRATION;  Surgeon: Melodi Lerner, MD;  Location: [facility];  Service: Orthopedics;  Laterality: Left;   GASTRIC ROUX-EN-Y N/A 07/06/2014   Procedure: LAPAROSCOPIC ROUX-EN-Y GASTRIC BYPASS, ENTEROLYSIS OF ADHESIONS WITH UPPER ENDOSCOPY;  Surgeon: Donnice Lunger, MD;  Location: [facility];  Service: General;  Laterality: N/A;   HAMMER TOE SURGERY Bilateral 02/17/2008   bilateral foot digit's 2 and 5   HERNIA REPAIR  may 2016   during gastric bypass   HIATAL HERNIA REPAIR N/A 07/06/2014   Procedure: LAPAROSCOPIC REPAIR OF HIATAL HERNIA;  Surgeon: Donnice Lunger, MD;  Location: [facility];  Service: General;  Laterality: N/A;   I & D KNEE WITH POLY EXCHANGE Left 12/11/2019   Procedure: LEFT KNEE POLY-LINER EXCHANGE;  Surgeon: Vernetta Lonni GRADE, MD;  Location: [facility];  Service: Orthopedics;  Laterality: Left;   JOINT REPLACEMENT  2010,2014,2019   left hip,right hip,left hip revision,left knee arthroscopy,l   KNEE ARTHROSCOPY Right 12/11/2019   Procedure: RIGHT KNEE ARTHROSCOPY WITH PARTIAL MEDIAL MENISCECTOMY;  Surgeon: Vernetta Lonni GRADE, MD;  Location: [facility];  Service: Orthopedics;  Laterality: Right;   KNEE ARTHROSCOPY WITH MEDIAL MENISECTOMY Left 07/17/2017   Procedure: LEFT KNEE ARTHROSCOPY WITH MEDIAL LATERAL MENISECTOMY;  Surgeon: Melodi Lerner, MD;  Location: [facility];  Service: Orthopedics;  Laterality: Left;   MASS EXCISION N/A 10/11/2016   Procedure: EXCISION OF PERIANAL MASS;  Surgeon: Debby Hila, MD;  Location[facility];  Service: General;  Laterality: N/A;   PARTIAL HYSTERECTOMY  2006   has ovaries   REFRACTIVE SURGERY     RIGHT HEART CATH N/A 11/15/2023   Procedure: RIGHT HEART CATH;  Surgeon: Jordan,  Peter M, MD;  Location: [facility] INVASIVE CV LAB;  Service: Cardiovascular;  Laterality: N/A;   SMALL INTESTINE SURGERY  gatric bypass   may 2016   THYROIDECTOMY  1980   TOTAL ABDOMINAL HYSTERECTOMY  08-25-2002   dr macario sharps   ovaries retained   TOTAL HIP ARTHROPLASTY Right 05/21/2012   Procedure: TOTAL HIP ARTHROPLASTY;  Surgeon: Lerner LULLA Melodi, MD;  Location: [facility];  Service: Orthopedics;  Laterality: Right;  TOTAL HIP ARTHROPLASTY Left 01-31-2009  dr melodi   TOTAL HIP REVISION Left 05/22/2017   Procedure: Left hip bearing surface and head revision;  Surgeon: Melodi Lerner, MD;  Location: [facility];  Service: Orthopedics;  Laterality: Left;   TOTAL KNEE ARTHROPLASTY Left 01/20/2018   Procedure: LEFT TOTAL KNEE ARTHROPLASTY;  Surgeon: Melodi Lerner, MD;  Location: [facility];  Service: Orthopedics;  Laterality: Left;    TOTAL KNEE ARTHROPLASTY Right 07/13/2022   Procedure: RIGHTTOTAL KNEE ARTHROPLASTY;  Surgeon: Vernetta Lonni GRADE, MD;  Location: [facility];  Service: Orthopedics;  Laterality: Right;   TRANSTHORACIC ECHOCARDIOGRAM  05/03/2016   ef 55-60%/  trivial MR/  mild TR   TUBAL LIGATION     Current Outpatient Medications on File Prior to Visit  Medication Sig Dispense Refill   albuterol  (VENTOLIN  HFA) 108 (90 Base) MCG/ACT inhaler Inhale 2 puffs into the lungs every 4 hours as needed for wheezing or shortness of breath. 18 g 3   benzonatate  (TESSALON ) 200 MG capsule Take 1 capsule (200 mg total) by mouth 3 (three) times daily as needed for cough. 30 capsule 1   budesonide  (ENTOCORT EC ) 3 MG 24 hr capsule Take 3 capsules (9 mg total) by mouth in the morning. 270 capsule 3   budesonide -formoterol  (SYMBICORT ) 160-4.5 MCG/ACT inhaler Inhale 2 puffs into the lungs 2 (two) times daily. 1 each 12   cyanocobalamin  (VITAMIN B12) 1000 MCG/ML injection INJECT 1 ML INTRAMUSCULARLY ONCE A MONTH 1 mL 32   empagliflozin  (JARDIANCE ) 10 MG TABS tablet Take 1 tablet (10 mg total) by mouth daily  before breakfast. 90 tablet 3   estradiol  (ESTRACE  VAGINAL) 0.1 MG/GM vaginal cream Place 1 g vaginally 3 (three) times a week. 42.5 g 3   furosemide  (LASIX ) 40 MG tablet Take 1 tablet (40 mg total) by mouth daily. 90 tablet 3   gabapentin  (NEURONTIN ) 600 MG tablet TAKE 1 TABLET BY MOUTH THREE TIMES A DAY 90 tablet 3   HYDROcodone -acetaminophen  (NORCO) 10-325 MG tablet One tab every 6 hours as needed for pain 20 tablet 0   HYRIMOZ  40 MG/0.4ML SOAJ Inject 40 mg into the skin once a week.     levothyroxine  (SYNTHROID ) 75 MCG tablet Take 1 tablet (75 mcg total) by mouth daily. 90 tablet 0   LORazepam  (ATIVAN ) 1 MG tablet Take 1 tablet (1 mg total) by mouth 2 (two) times daily as needed for anxiety. 60 tablet 1   metoprolol  succinate (TOPROL -XL) 25 MG 24 hr tablet Take 1.5 tablets (37.5 mg total) by mouth daily. 45 tablet 11   potassium chloride  (KLOR-CON  M) 10 MEQ tablet Take 1 tablet (10 mEq total) by mouth daily. Take 1 tablet every other day with lasix  (furosemide ) 90 tablet 3   promethazine  (PHENERGAN ) 25 MG tablet Take 1 tablet (25 mg total) by mouth every 8 (eight) hours as needed for nausea and/or vomiting 30 tablet 5   tiZANidine  (ZANAFLEX ) 4 MG tablet TAKE 1 TABLET BY MOUTH 3 TIMES DAILY. 90 tablet 3   Ubrogepant  (UBRELVY ) 100 MG TABS Take 1 tablet (100 mg total) by mouth every 2 (two) hours as needed. Maximum 200mg  a day. 16 tablet 11   Vitamin D , Ergocalciferol , (DRISDOL ) 1.25 MG (50000 UNIT) CAPS capsule TAKE 1 CAPSULE BY MOUTH ONE TIME PER WEEK 4 capsule 2   Current Facility-Administered Medications on File Prior to Visit  Medication Dose Route Frequency Provider Last Rate Last Admin   gadobenate dimeglumine  (MULTIHANCE ) injection 20 mL  20 mL Intravenous  Once PRN Ines Onetha NOVAK, MD       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      PE Today's Vitals   01/27/24 1546  BP: 100/62  Pulse: 74  Temp:  97.7 F (36.5 C)  TempSrc: Oral  SpO2: 99%  Weight: 294 lb (133.4 kg)   Body mass index is 46.74 kg/m.  Physical Exam Vitals reviewed. Exam conducted with a chaperone present.  Constitutional:      General: She is not in acute distress.    Appearance: Normal appearance.  HENT:     Head: Normocephalic and atraumatic.     Nose: Nose normal.  Eyes:     Extraocular Movements: Extraocular movements intact.     Conjunctiva/sclera: Conjunctivae normal.  Pulmonary:     Effort: Pulmonary effort is normal.  Genitourinary:    General: Normal vulva.     Exam position: Lithotomy position.     Vagina: Vaginal discharge present.     Uterus: Absent.      Comments: Cervix absent Musculoskeletal:        General: Normal range of motion.     Cervical back: Normal range of motion.  Neurological:     General: No focal deficit present.     Mental Status: She is alert.  Psychiatric:        Mood and Affect: Mood normal.        Behavior: Behavior normal.     Assessment and Plan:        BV (bacterial vaginosis) -     SureSwab Advanced Vaginitis, TMA -     WET PREP FOR TRICH, YEAST, CLUE -     metroNIDAZOLE ; Place 1 Applicatorful vaginally at bedtime for 5 days.  Dispense: 50 g; Refill: 0  Candidal intertrigo -     Nystatin ; Apply 1 Application topically 2 (two) times daily for 14 days.  Dispense: 60 g; Refill: 1    Vera LULLA Pa, MD

## 2024-01-28 ENCOUNTER — Encounter (HOSPITAL_COMMUNITY)
Admission: RE | Admit: 2024-01-28 | Discharge: 2024-01-28 | Disposition: A | Source: Ambulatory Visit | Attending: Pulmonary Disease

## 2024-01-28 ENCOUNTER — Encounter: Payer: Self-pay | Admitting: Cardiology

## 2024-01-28 DIAGNOSIS — I5032 Chronic diastolic (congestive) heart failure: Secondary | ICD-10-CM | POA: Diagnosis present

## 2024-01-28 LAB — SURESWAB® ADVANCED VAGINITIS,TMA
CANDIDA SPECIES: NOT DETECTED
Candida glabrata: DETECTED — AB
SURESWAB(R) ADV BACTERIAL VAGINOSIS(BV),TMA: NEGATIVE
TRICHOMONAS VAGINALIS (TV),TMA: NOT DETECTED

## 2024-01-28 NOTE — Progress Notes (Signed)
 Daily Session Note  Patient Details  Name: Victoria [name] MRN: [id number] Date of Birth: 08-19-1960 Referring Provider:   Conrad Ports Pulmonary Rehab Walk Test from 12/27/2023 in [facility]  Referring Provider Dewald    Encounter Date: 01/28/2024  Check In:  Session Check In - 01/28/24 1044       Check-In   Supervising physician immediately available to respond to emergencies [facility] MD immediately available    Physician(s) Damien Braver, NP    Location [facility]    Staff Present Augustin Sharps, Neita Moats, MS, ACSM-CEP, Exercise Physiologist;Muzammil Bruins Harvy, RN, BSN;Randi Reeve BS, ACSM-CEP, Exercise Physiologist    Virtual Visit No    Medication changes reported     No    Fall or balance concerns reported    No    Tobacco Cessation No Change    Warm-up and Cool-down Performed as group-led instruction   only   Resistance Training Performed Yes    VAD Patient? No    PAD/SET Patient? No      Pain Assessment   Currently in Pain? No/denies    Pain Score 0-No pain    Multiple Pain Sites No          Capillary Blood Glucose: Results for orders placed or performed in visit on 01/27/24 (from the past 24 hours)  WET PREP FOR TRICH, YEAST, CLUE     Status: None   Collection Time: 01/27/24  4:22 PM   Specimen: Genital  Result Value Ref Range   Source: VAGINA    RESULT     *Note: Due to a large number of results and/or encounters for the requested time period, some results have not been displayed. A complete set of results can be found in Results Review.      Social History   Tobacco Use  Smoking Status Never  Smokeless Tobacco Never    Goals Met:  Independence with exercise equipment Exercise tolerated well Queuing for purse lip breathing No report of concerns or symptoms today Strength training completed today  Goals Unmet:  Not Applicable  Comments: Service time is from 1000 to  1142    Dr. Slater Staff is Medical Director for Pulmonary Rehab at [facility].

## 2024-01-28 NOTE — Progress Notes (Signed)
 Home Exercise Prescription I have reviewed a Home Exercise Prescription with Victoria [name]. Victoria [name] is not currently exercising at home. She does have exercise equipment at home. Victoria [name] wants to get back to walking on the treadmill. I encouraged her to walk outside or around a grocery store. Recommended exercising 1 non-rehab day/wk for 30 min or 2 x 15 min/day. She agreed with my recommendations. Victoria [name] seems motivated to exercise. She mentioned how she wants to get back to normal. The patient stated that their goals were to travel to [place], decrease need for oxygen, and lose weight. We reviewed exercise guidelines, target heart rate during exercise, RPE Scale, weather conditions, endpoints for exercise, warmup and cool down. The patient is encouraged to come to me with any questions. I will continue to follow up with the patient to assist them with progression and safety. Spent 15 min with patient discussing home exercise plan and goals  Victoria JINNY Moats, MS, ACSM-CEP 01/28/2024 11:58 AM

## 2024-01-30 ENCOUNTER — Encounter (HOSPITAL_COMMUNITY)
Admission: RE | Admit: 2024-01-30 | Discharge: 2024-01-30 | Disposition: A | Source: Ambulatory Visit | Attending: Pulmonary Disease

## 2024-01-30 VITALS — Wt 293.4 lb

## 2024-01-30 DIAGNOSIS — I5032 Chronic diastolic (congestive) heart failure: Secondary | ICD-10-CM

## 2024-01-30 NOTE — Telephone Encounter (Signed)
 NFN

## 2024-01-30 NOTE — Progress Notes (Signed)
 Daily Session Note  Patient Details  Name: Victoria [name] MRN: [id number] Date of Birth: [birth year] Referring Provider:   Conrad Ports Pulmonary Rehab Walk Test from 12/27/2023 in [facility]  Referring Provider Dewald    Encounter Date: 01/30/2024  Check In:  Session Check In - 01/30/24 1126       Check-In   Supervising physician immediately available to respond to emergencies [facility] MD immediately available    Physician(s) Rosaline Bane, NP    Location [facility]    Staff Present Augustin Sharps, Neita Moats, MS, ACSM-CEP, Exercise Physiologist;Mary Harvy, RN, BSN;Terrill Alperin Midge BS, ACSM-CEP, Exercise Physiologist;Carlette Bernett, RN, BSN    Virtual Visit No    Medication changes reported     No    Fall or balance concerns reported    No    Tobacco Cessation No Change    Warm-up and Cool-down Performed as group-led instruction   only   Resistance Training Performed Yes    VAD Patient? No    PAD/SET Patient? No      Pain Assessment   Currently in Pain? No/denies    Pain Score 0-No pain    Multiple Pain Sites No          Capillary Blood Glucose: No results found. However, due to the size of the patient record, not all encounters were searched. Please check Results Review for a complete set of results.    Social History   Tobacco Use  Smoking Status Never  Smokeless Tobacco Never    Goals Met:  Independence with exercise equipment Exercise tolerated well No report of concerns or symptoms today Strength training completed today  Goals Unmet:  Not Applicable  Comments: Service time is from 1008 to 1141.    Dr. Slater Staff is Medical Director for Pulmonary Rehab at [facility].

## 2024-01-31 ENCOUNTER — Other Ambulatory Visit (HOSPITAL_COMMUNITY): Payer: Self-pay

## 2024-01-31 ENCOUNTER — Ambulatory Visit: Payer: Self-pay | Admitting: Obstetrics and Gynecology

## 2024-01-31 DIAGNOSIS — B3731 Acute candidiasis of vulva and vagina: Secondary | ICD-10-CM

## 2024-01-31 MED ORDER — BORIC ACID VAGINAL 600 MG VA SUPP: 1.0000 | Freq: Every evening | VAGINAL | 0 refills | Status: AC

## 2024-02-03 DIAGNOSIS — L814 Other melanin hyperpigmentation: Secondary | ICD-10-CM | POA: Diagnosis not present

## 2024-02-04 ENCOUNTER — Other Ambulatory Visit: Payer: Self-pay | Admitting: Pulmonary Disease

## 2024-02-04 ENCOUNTER — Telehealth: Payer: Self-pay

## 2024-02-04 ENCOUNTER — Ambulatory Visit: Admitting: Pulmonary Disease

## 2024-02-04 ENCOUNTER — Encounter: Payer: Self-pay | Admitting: Pulmonary Disease

## 2024-02-04 VITALS — BP 134/81 | HR 75 | Wt 289.0 lb

## 2024-02-04 DIAGNOSIS — J9611 Chronic respiratory failure with hypoxia: Secondary | ICD-10-CM | POA: Diagnosis not present

## 2024-02-04 DIAGNOSIS — I5032 Chronic diastolic (congestive) heart failure: Secondary | ICD-10-CM

## 2024-02-04 DIAGNOSIS — G4733 Obstructive sleep apnea (adult) (pediatric): Secondary | ICD-10-CM

## 2024-02-04 DIAGNOSIS — E66813 Obesity, class 3: Secondary | ICD-10-CM | POA: Diagnosis not present

## 2024-02-04 DIAGNOSIS — Z6841 Body Mass Index (BMI) 40.0 and over, adult: Secondary | ICD-10-CM | POA: Diagnosis not present

## 2024-02-04 DIAGNOSIS — I272 Pulmonary hypertension, unspecified: Secondary | ICD-10-CM | POA: Diagnosis not present

## 2024-02-04 MED ORDER — ZEPBOUND 2.5 MG/0.5ML ~~LOC~~ SOAJ: 2.5000 mg | SUBCUTANEOUS | 0 refills | Status: DC

## 2024-02-04 NOTE — Patient Instructions (Addendum)
 We will work on getting you started on Zepbound  for Sleep apnea and weight loss  Continue pulmonary rehab  Continue supplemental oxygen to maintain oxygen levels 88% or higher  We will schedule you for overnight oxygen test on room air on your CPAP to determine if you need oxygen with your CPAP  Continue lasix  40mg  daily per cardiology  Continue symbicort  2 puffs twice daily  Follow up in 4 months

## 2024-02-04 NOTE — Progress Notes (Signed)
 Established Patient Pulmonology Office Visit   Subjective:  Patient ID: Victoria [name], female    DOB: [birth year]  MRN: [id number]  CC:  Chief Complaint  Patient presents with   Medical Management of Chronic Issues    Pt states no ONO - sleep issues / trys to get at least 4 hours on Cpap    Discussed the use of AI scribe software for clinical note transcription with the patient, who gave verbal consent to proceed.  History of Present Illness Victoria [name] is a 63 year old female with obesity, obstructive sleep apnea and pulmonary hypertension who presents for follow-up.  She feels all right using CPAP with 100% use over the last 30 days, averaging 5 hours 43 minutes per night. Her AHI is 1.6. She sometimes uses a sleep aid but still applies CPAP every night.  She continues Symbicort  2 puffs twice daily and Lasix  40 mg daily. She has some cough but no wheezing. She recently stopped Jardiance  due to recurrent infections and has no replacement medication.  She attends heart failure rehab and feels her activity level is improving.         ROS    Current Outpatient Medications:    albuterol  (VENTOLIN  HFA) 108 (90 Base) MCG/ACT inhaler, Inhale 2 puffs into the lungs every 4 hours as needed for wheezing or shortness of breath., Disp: 18 g, Rfl: 3   benzonatate  (TESSALON ) 200 MG capsule, Take 1 capsule (200 mg total) by mouth 3 (three) times daily as needed for cough., Disp: 30 capsule, Rfl: 1   Boric Acid Vaginal 600 MG SUPP, Place 1 suppository vaginally at bedtime for 21 days., Disp: 21 suppository, Rfl: 0   budesonide  (ENTOCORT EC ) 3 MG 24 hr capsule, Take 3 capsules (9 mg total) by mouth in the morning., Disp: 270 capsule, Rfl: 3   budesonide -formoterol  (SYMBICORT ) 160-4.5 MCG/ACT inhaler, Inhale 2 puffs into the lungs 2 (two) times daily., Disp: 1 each, Rfl: 12   cyanocobalamin  (VITAMIN B12) 1000 MCG/ML injection, INJECT 1 ML INTRAMUSCULARLY ONCE A MONTH, Disp: 1 mL, Rfl: 32    estradiol  (ESTRACE  VAGINAL) 0.1 MG/GM vaginal cream, Place 1 g vaginally 3 (three) times a week., Disp: 42.5 g, Rfl: 3   furosemide  (LASIX ) 40 MG tablet, Take 1 tablet (40 mg total) by mouth daily., Disp: 90 tablet, Rfl: 3   gabapentin  (NEURONTIN ) 600 MG tablet, TAKE 1 TABLET BY MOUTH THREE TIMES A DAY, Disp: 90 tablet, Rfl: 3   HYDROcodone -acetaminophen  (NORCO) 10-325 MG tablet, One tab every 6 hours as needed for pain, Disp: 20 tablet, Rfl: 0   HYRIMOZ  40 MG/0.4ML SOAJ, Inject 40 mg into the skin once a week., Disp: , Rfl:    levothyroxine  (SYNTHROID ) 75 MCG tablet, Take 1 tablet (75 mcg total) by mouth daily., Disp: 90 tablet, Rfl: 0   LORazepam  (ATIVAN ) 1 MG tablet, Take 1 tablet (1 mg total) by mouth 2 (two) times daily as needed for anxiety., Disp: 60 tablet, Rfl: 1   metoprolol  succinate (TOPROL -XL) 25 MG 24 hr tablet, Take 1.5 tablets (37.5 mg total) by mouth daily., Disp: 45 tablet, Rfl: 11   nystatin  (MYCOSTATIN /NYSTOP ) powder, Apply 1 Application topically 2 (two) times daily for 14 days., Disp: 60 g, Rfl: 1   potassium chloride  (KLOR-CON  M) 10 MEQ tablet, Take 1 tablet (10 mEq total) by mouth daily. Take 1 tablet every other day with lasix  (furosemide ), Disp: 90 tablet, Rfl: 3   promethazine  (PHENERGAN ) 25 MG tablet, Take 1 tablet (25  mg total) by mouth every 8 (eight) hours as needed for nausea and/or vomiting, Disp: 30 tablet, Rfl: 5   tiZANidine  (ZANAFLEX ) 4 MG tablet, TAKE 1 TABLET BY MOUTH 3 TIMES DAILY., Disp: 90 tablet, Rfl: 3   Ubrogepant  (UBRELVY ) 100 MG TABS, Take 1 tablet (100 mg total) by mouth every 2 (two) hours as needed. Maximum 200mg  a day., Disp: 16 tablet, Rfl: 11   Vitamin D , Ergocalciferol , (DRISDOL ) 1.25 MG (50000 UNIT) CAPS capsule, TAKE 1 CAPSULE BY MOUTH ONE TIME PER WEEK, Disp: 4 capsule, Rfl: 2 No current facility-administered medications for this visit.  Facility-Administered Medications Ordered in Other Visits:    gadobenate dimeglumine  (MULTIHANCE ) injection  20 mL, 20 mL, Intravenous, Once PRN, Ines Onetha NOVAK, MD      Objective:  BP 134/81   Pulse 75   Wt 289 lb (131.1 kg)   SpO2 99%   PF (!) 2 L/min Comment: POC  BMI 45.95 kg/m   Wt Readings from Last 3 Encounters:  02/04/24 289 lb (131.1 kg)  01/27/24 294 lb (133.4 kg)  01/21/24 288 lb 2.3 oz (130.7 kg)   BMI Readings from Last 3 Encounters:  02/04/24 45.95 kg/m  01/27/24 46.74 kg/m  01/21/24 45.81 kg/m   SpO2 Readings from Last 3 Encounters:  02/04/24 99%  01/27/24 99%  01/15/24 98%    Physical Exam Constitutional:      General: She is not in acute distress.    Appearance: Normal appearance. She is obese.  Eyes:     General: No scleral icterus.    Conjunctiva/sclera: Conjunctivae normal.  Cardiovascular:     Rate and Rhythm: Normal rate and regular rhythm.  Pulmonary:     Breath sounds: No wheezing, rhonchi or rales.  Musculoskeletal:     Right lower leg: No edema.     Left lower leg: No edema.  Skin:    General: Skin is warm and dry.  Neurological:     General: No focal deficit present.      Diagnostic Review:  Last CBC Lab Results  Component Value Date   WBC 5.5 11/12/2023   HGB 10.9 (L) 11/15/2023   HCT 32.0 (L) 11/15/2023   MCV 99.7 11/12/2023   MCH 33.1 11/12/2023   RDW 14.6 11/12/2023   PLT 178 11/12/2023   Last metabolic panel Lab Results  Component Value Date   GLUCOSE 96 11/29/2023   NA 143 11/29/2023   K 4.5 11/29/2023   CL 105 11/29/2023   CO2 24 11/29/2023   BUN 22 11/29/2023   CREATININE 1.25 (H) 11/29/2023   EGFR 49 (L) 11/29/2023   CALCIUM  9.5 11/29/2023   PROT 7.5 01/06/2024   ALBUMIN 4.2 10/17/2023   LABGLOB 3.0 10/17/2023   AGRATIO 1.4 05/24/2022   BILITOT 0.7 01/06/2024   ALKPHOS 73 10/17/2023   AST 17 01/06/2024   ALT 16 01/06/2024   ANIONGAP 11 06/12/2023   @BNPPULM @     Assessment & Plan:   Assessment & Plan Post-capillary pulmonary hypertension (HCC)     Obstructive sleep apnea  Orders:    Ambulatory referral to Pharmacotherapy Clinic   Pulse oximetry, overnight; Future  Chronic diastolic heart failure (HCC)     Chronic respiratory failure with hypoxia (HCC)  Orders:   Pulse oximetry, overnight; Future  Class 3 severe obesity due to excess calories with body mass index (BMI) of 45.0 to 49.9 in adult, unspecified whether serious comorbidity present (HCC)  Orders:   tirzepatide  (ZEPBOUND ) 2.5 MG/0.5ML Pen; Inject 2.5  mg into the skin once a week.   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 Oxygen reduces pulmonary pressure. Overnight oxygen test pending. - Ordered overnight oxygen test to determine if oxygen needed with CPAP - Continue oxygen therapy.  Obstructive sleep apnea Well-managed with CPAP. Excellent compliance and effective control with AHI 1.6. - Continue CPAP therapy.  Chronic respiratory failure with hypoxia Managed with oxygen therapy. Breathing tests suggest restrictive pattern. CT shows minor scarring. Weight may contribute to restriction. - Continue oxygen therapy. - Continue pulmonary rehabilitation.  Chronic diastolic heart failure Managed with Lasix  and heart failure rehabilitation, which is beneficial. - Continue Lasix  40 mg daily. - Continue heart failure rehabilitation.  Obesity Contributing to respiratory issues. Weight loss crucial for respiratory improvement. - Prescribed Zepbound   - Continue weight loss efforts through health and wellness programs and pulmonary rehabilitation.      No follow-ups on file.   Dorn KATHEE Chill, MD

## 2024-02-04 NOTE — Assessment & Plan Note (Signed)
°  Orders:   Ambulatory referral to Pharmacotherapy Clinic   Pulse oximetry, overnight; Future

## 2024-02-04 NOTE — Telephone Encounter (Signed)
 Received referral from Dr. Kara for Zepbound . Forwarding request to Rx Prior Auth Team pool for further assistance.

## 2024-02-05 ENCOUNTER — Other Ambulatory Visit (HOSPITAL_COMMUNITY): Payer: Self-pay

## 2024-02-05 ENCOUNTER — Encounter: Payer: Self-pay | Admitting: Pulmonary Disease

## 2024-02-05 ENCOUNTER — Telehealth: Payer: Self-pay

## 2024-02-05 NOTE — Telephone Encounter (Signed)
 Thank you :)

## 2024-02-05 NOTE — Telephone Encounter (Signed)
 Pharmacy Patient Advocate Encounter  Patient does not qualify for Zepbound  therapy for OSA at this time due to most recent sleep study 2023 showing MILD OSA

## 2024-02-06 ENCOUNTER — Ambulatory Visit (HOSPITAL_COMMUNITY)
Admission: RE | Admit: 2024-02-06 | Discharge: 2024-02-06 | Disposition: A | Discharge status: Home / Self Care | Source: Ambulatory Visit | Attending: Cardiology | Admitting: Cardiology

## 2024-02-06 ENCOUNTER — Encounter (HOSPITAL_COMMUNITY)
Admission: RE | Admit: 2024-02-06 | Discharge: 2024-02-06 | Disposition: A | Discharge status: Home / Self Care | Source: Ambulatory Visit | Attending: Pulmonary Disease | Admitting: Pulmonary Disease

## 2024-02-06 ENCOUNTER — Telehealth: Payer: Self-pay

## 2024-02-06 DIAGNOSIS — R0602 Shortness of breath: Secondary | ICD-10-CM | POA: Insufficient documentation

## 2024-02-06 DIAGNOSIS — R0609 Other forms of dyspnea: Secondary | ICD-10-CM | POA: Diagnosis not present

## 2024-02-06 DIAGNOSIS — I5032 Chronic diastolic (congestive) heart failure: Secondary | ICD-10-CM | POA: Diagnosis not present

## 2024-02-06 NOTE — Progress Notes (Signed)
 Daily Session Note  Patient Details  Name: Victoria [name] MRN: [id number] Date of Birth: Mar 27, 1960 Referring Provider:   Conrad Ports Pulmonary Rehab Walk Test from 12/27/2023 in [facility]  Referring Provider Dewald    Encounter Date: 02/06/2024  Check In:  Session Check In - 02/06/24 0954       Check-In   Supervising physician immediately available to respond to emergencies [facility] MD immediately available    Physician(s) Lum Louis, NP    Location [facility]    Staff Present Augustin Sharps, Neita Moats, MS, ACSM-CEP, Exercise Physiologist;Mary Harvy, RN, BSN;Maleta Pacha BS, ACSM-CEP, Exercise Physiologist    Virtual Visit No    Medication changes reported     No    Fall or balance concerns reported    No    Tobacco Cessation No Change    Warm-up and Cool-down Performed as group-led instruction   only   Resistance Training Performed Yes    VAD Patient? No    PAD/SET Patient? No      Pain Assessment   Currently in Pain? No/denies    Multiple Pain Sites No          Capillary Blood Glucose: No results found. However, due to the size of the patient record, not all encounters were searched. Please check Results Review for a complete set of results.    Tobacco Use History[1]  Goals Met:  Exercise tolerated well No report of concerns or symptoms today Strength training completed today  Goals Unmet:  Not Applicable  Comments: Service time is from 1113 to 1145.    Dr. Slater Staff is Medical Director for Pulmonary Rehab at [facility].     [1]  Social History Tobacco Use  Smoking Status Never  Smokeless Tobacco Never

## 2024-02-08 DIAGNOSIS — I5032 Chronic diastolic (congestive) heart failure: Secondary | ICD-10-CM | POA: Diagnosis not present

## 2024-02-10 ENCOUNTER — Other Ambulatory Visit: Payer: Self-pay | Admitting: Podiatry

## 2024-02-10 ENCOUNTER — Other Ambulatory Visit: Payer: Self-pay | Admitting: Internal Medicine

## 2024-02-10 NOTE — Progress Notes (Addendum)
 "   Patient Care Team: Ryhanna Dunsmore, Ronal PARAS, MD as PCP - General Shlomo Wilbert SAUNDERS, MD as PCP - Cardiology (Cardiology) Mealor, Eulas BRAVO, MD as PCP - Electrophysiology (Cardiology) Wonda Cy BROCKS, RD as Dietitian (Family Medicine)  Visit Date: 02/24/2024  Subjective:    Patient ID: Victoria [name] , Female   DOB: 03/21/1960, 63 y.o.    MRN: [id number]   63 y.o. Female presents for advice on chronic diastolic heart failure. She  is frustrated with her situation. Has fatigue and no energy. Is oxygen dependent. 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 Now unable to work due to fatigue and SOB. BNP on Dec. 23 was 26 and was 64 when checked 7 months ago.   Has been evaluated by Dr. Peter Jordan and Dr. Wilbert Shlomo. Dx was chronic diastolic heart failure. Coronary calcium  score in 2022 was 0. Hx PAT/PVCs and was tried on Flecainide  but Dr. Nancey discontinued this.as she was not felt to have enough ectopy to justify flecainide  use. Right heart cath Sept 2025 showed mild pulmonary HTN with PAP 42/18 with a mean of 30 mm Hg elevated mean wedge pressure 23 mm Hg and normal cardiac output and cardiac index.   Patient has a past medical history of Impaired glucose tolerance, Hypothyroidism, Anxiety, Vitamin D  deficiency, OSA, Migraine Headaches, Asthma, Musculoskeletal pain .Hx gastric bypass surgery.  History of Hypothyroidism treated with Levothyroxine  88 mg daily. 02/18/2024 TSH 4.64 compared to 01/10/2024 TSH 0.25.    History of Chronic Diastolic heart failure. Has been evaluated by [facility], Drs. Wilbert Shlomo and Peter Jordan.    Also seeing Dr. Kara, Pulmonologist.  She requires continuous oxygen therapy but still has fatigue and shortness of breath. In May of this year she had a BNP of 114 and a month ago it was 22.  She has concerns about her cardiac situation. Has chronic fatigue and decreased exercise tolerance. Dr. Kara feels she has chronic diastolic heart failure, sleep  apnea, and postcapillary pulmonary hypertension.   Cardiologist has her on Jardiance  for weight loss and  for Type 2 Diabetes.Currently on Lasix  40 mg daily.Also on flecainide . Is on Zepbound .   She had cardiac cath by Dr. Peter Jordan on Sept 19 revealing mild pulmonary  hypertension with PAP 42/18, mean 30 mm Hg, Elevated LV filling pressures PCWP 27/28, mean 23 mm Hg. Pt had normal cardiac output 7.2 L/min. Was recommended to take Lasix  every other day and restrict sodium.   Says she has has had SOB since the beginning of 2025. In [place], she presented to the ED with SOB. D-dimer was elevated at 2.84. BNP was normal at 177. CT angio of the chest showed bronchomalacia involving right bronchus intermedius and left lower lobar bronchus.  No acute PE.  Had mild bronchial wall thickening consistent with airway inflammation.   History of Palpitations; Non-sustained Atrial Tachycardia treated with Flecainide  50 mg daily, Metoprolol  succinate 25 mg daily, and started on Lasix  40 mg daily x3 days on 5/08 for Hypervolemia w/ Klor-Con  10 meq to prevent potassium deficiency BP normal today at 100/60.    History of Hyperlipidemia with 01/06/2024 Lipid Panel CHOL 259, LDL 146, Otherwise WNL.  She is not currently on a statin.    Impaired Glucose Tolerance with 01/06/2024 HgbA1c 5.9% decreased from 6.1% a year ago.   Recently saw Dr. Roselind , Cardiologist at [facility] who felt SOB was multifactorial..   Past Medical History:  Diagnosis Date   Allergy   approx-5years ago   estradiol ,topical patch only-rash   Anxiety    Back pain    Blood transfusion without reported diagnosis 2010   after hip replacement   Bursitis    right shoulder   Cataract September 2019   bilateral, lasik   Chronic diastolic heart failure (HCC)    Chronic kidney disease    hematuria, has been worked up, her normal   Chronic leukopenia    intermittant since 2010, followed by Dr. Perri now   Chronic neck pain     Constipation    Degenerative joint disease of low back 09/02/2015   Dr. Hiram   Diastolic heart failure (HCC)    Displacement of lumbar intervertebral disc    Dysrhythmia    Tachycardia, PVCs, PACs   Edema of both lower legs    Fibrocystic breast    Gallbladder problem    GERD (gastroesophageal reflux disease)    Headache    per pt more stress/tension   Heart palpitations    SEE EPIC ENCOUTNER , CARDIOLOGY DR. WILBERT TURNER 2018; reports on 05-16-17  i haven't had any bouts of those lately     Hemorrhoids    Hidradenitis suppurativa    History of Clostridium difficile infection 12/2010   History of exercise intolerance    ETT on 04-27-2016-- negative ([facility] treadmill score 7)   History of Helicobacter pylori infection 2001 and 09/ 2012   HSV-2 infection    genital   Hx of adenomatous colonic polyps 10/17/2007   Hydradenitis    per pt currently treated with Humira  injection   Hypertension    Hyperthyroidism    Hypocupremia    Hypothyroidism    Hypothyroidism, postsurgical 1980   IBS (irritable bowel syndrome)    Insomnia    Iron  deficiency    Joint pain    Leukopenia    Lumbosacral radiculopathy    Lymphocytic colitis 05/14/2022   dx from colonoscopy   Medial meniscus tear    right knee   Mild obstructive sleep apnea    Mitral regurgitation    Mild by echo 06/2023   Numbness and tingling of right arm    Obesity    Osteoarthritis    Palpitations    Pernicious anemia    b12 def   Peroneal neuropathy    PONV (postoperative nausea and vomiting)    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   Prediabetes    Pulmonary hypertension (HCC)    Reactive hypoglycemia followed by pcp   post gastrectomy dumping syndrome   SOB (shortness of breath)    Spinal stenosis    Tendonitis    right shoulder   Tricuspid regurgitation    Mild to moderate echo 06/2023   Varicose veins    Vertigo    Vitamin B 12 deficiency    Vitamin D  deficiency      Family History  Problem  Relation Age of Onset   High blood pressure Mother    Hypertension Mother    Headache Mother    High Cholesterol Mother    Thyroid  disease Mother    Obesity Mother    Kidney disease Mother    Arthritis Mother    Hyperlipidemia Mother    Hypertension Father    Diabetes Father    High blood pressure Father    High Cholesterol Father    Heart disease Father    Obesity Father    Vision loss Father    Diabetes Sister    Hypertension Sister  Obesity Sister    Thyroid  disease Brother    Thyroid  disease Maternal Aunt    Hypertension Maternal Grandmother    Stroke Maternal Grandmother    Diabetes Paternal Grandmother    Vision loss Paternal Grandmother    Diabetes Paternal Aunt    Hyperlipidemia Maternal Aunt    Hypertension Maternal Aunt    Obesity Maternal Aunt    Migraines Neg Hx     Social History   Social History Narrative   Lives at home with spouse   Right handed   Caffeine: diet zero mtn dew, occasionally     2025 - Recently retired from [employer] as an [employer], previously worked full-time, now working 1 day/ week. Divorced, remarried - husband is disabled. 2 adult twin daughters. Non-smoker or drinker.       Review of Systems  All other systems reviewed and are negative.       Objective:   Vitals: BP 100/60   Pulse 61   Ht 5' 6.5 (1.689 m)   Wt 294 lb (133.4 kg)   SpO2 96%   BMI 46.74 kg/m    Physical Exam Vitals and nursing note reviewed.  Constitutional:      General: She is not in acute distress.    Appearance: Normal appearance. She is not toxic-appearing.  HENT:     Head: Normocephalic and atraumatic.  Neck:     Vascular: No carotid bruit.  Cardiovascular:     Rate and Rhythm: Normal rate and regular rhythm. No extrasystoles are present.    Pulses: Normal pulses.     Heart sounds: Normal heart sounds. No murmur heard.    No friction rub. No gallop.  Pulmonary:     Effort: Pulmonary effort is normal. No respiratory distress.      Breath sounds: Normal breath sounds. No wheezing or rales.  Skin:    General: Skin is warm and dry.  Neurological:     Mental Status: She is alert and oriented to person, place, and time. Mental status is at baseline.  Psychiatric:        Mood and Affect: Mood normal.        Behavior: Behavior normal.        Thought Content: Thought content normal.        Judgment: Judgment normal.       Results:   Labs:       Component Value Date/Time   NA 143 11/29/2023 0849   K 4.5 11/29/2023 0849   CL 105 11/29/2023 0849   CO2 24 11/29/2023 0849   GLUCOSE 96 11/29/2023 0849   GLUCOSE 104 (H) 09/05/2023 1626   BUN 22 11/29/2023 0849   CREATININE 1.25 (H) 11/29/2023 0849   CREATININE 1.28 (H) 09/05/2023 1626   CALCIUM  9.5 11/29/2023 0849   PROT 7.5 01/06/2024 0931   PROT 7.2 10/17/2023 0923   ALBUMIN 4.2 10/17/2023 0923   AST 17 01/06/2024 0931   AST 26 10/16/2018 0903   ALT 16 01/06/2024 0931   ALT 26 10/16/2018 0903   ALKPHOS 73 10/17/2023 0923   BILITOT 0.7 01/06/2024 0931   BILITOT 0.4 10/17/2023 0923   BILITOT 0.5 10/16/2018 0903   GFRNONAA 51 (L) 06/12/2023 1810   GFRNONAA 93 06/30/2020 1105   GFRAA 108 06/30/2020 1105     Lab Results  Component Value Date   WBC 5.5 11/12/2023   HGB 10.9 (L) 11/15/2023   HCT 32.0 (L) 11/15/2023   MCV 99.7 11/12/2023   PLT 178  11/12/2023    Lab Results  Component Value Date   CHOL 259 (H) 01/06/2024   HDL 97 01/06/2024   LDLCALC 146 (H) 01/06/2024   TRIG 69 01/06/2024   CHOLHDL 2.7 01/06/2024    Lab Results  Component Value Date   HGBA1C 5.9 (H) 01/06/2024     Lab Results  Component Value Date   TSH 4.64 (H) 02/18/2024        Assessment & Plan:   Orders Placed This Encounter  Procedures   AMB referral to CHF clinic    Referral Priority:   Routine    Referral Type:   Consultation    Referral Reason:   Specialty Services Required    Referred to Provider:   Cherrie Toribio SAUNDERS, MD    Number of Visits Requested:    1     Hypothyroidism: 02/18/2024 TSH 4.64 and 01/10/2024 TSH 0.25. Have been trying to adjust  dose of thyroid   replacement med to achieve TSH of 1.00.  Now on Levothyroxine  88 mcg daily as of December 24. Recheck TSH in 6 weeks.     Chronic Diastolic heart failure: Has been evaluated by [facility], Drs. Wilbert Bihari and Peter Jordan. Patient is frustrated with her situation of chronic fatigue and no energy.  I have placed Referred to HF Clinic for thorough evaluation of her situation and provide advice for her going forward. She has questions about her prognosis and     Followed by  Dr. Kara, Pulmonologist.  She requires continuous oxygen therapy but still has fatigue and shortness of breath. In May of this year, she had a BNP of 114 and a month ago it was 22. . She has concerns about her cardiac situation. Has chronic fatigue and decreased exercise tolerance. Dr. Kara feels she has chronic diastolic heart failure, sleep apnea, and postcapillary pulmonary hypertension.   She had cardiac cath by Dr. Peter Jordan on Sept 19 revealing mild pulmonary  hypertension with PAP 42/18, mean 30 mm Hg, Elevated LV filling pressures PCWP 27/28, mean 23 mm Hg. Pt had normal cardiac output 7.2 L/min. Was recommended to take Lasix  every other day and restrict sodium.   Says she has has had SOB since the beginning of 2025. In [place], she presented to the ED with SOB. D-dimer was elevated at 2.84. BNP was normal at 177. CT angio of the chest showed bronchomalacia involving right bronchus intermedius and left lower lobar bronchus.  No acute PE.  Had mild bronchial wall thickening consistent with airway inflammation.   Palpitations; Non-sustained Atrial Tachycardia: treated with Flecainide  50 mg daily, Metoprolol  succinate 25 mg daily, and started on Lasix  40 mg daily x3 days on 5/08 for Hypervolemia w/ Klor-Con  10 meq to prevent potassium deficiency BP normal today at 100/60.    Hyperlipidemia: with  01/06/2024 Lipid Panel CHOL 259, LDL 146, Otherwise WNL.  She is not currently on a statin.    Impaired Glucose Tolerance: with 01/06/2024 HgbA1c 5.9% decreased from 6.1% a year ago.     I,Victoria [name],acting as a scribe for Ronal JINNY Hailstone, MD.,have documented all relevant documentation on the behalf of Ronal JINNY Hailstone, MD,as directed by  Ronal JINNY Hailstone, MD while in the presence of Ronal JINNY Hailstone, MD.   I, Ronal JINNY Hailstone, MD, have reviewed all documentation for this visit. The documentation on 02/24/2024 for the exam, diagnosis, procedures, and orders are all accurate and complete.    "

## 2024-02-11 ENCOUNTER — Encounter (HOSPITAL_COMMUNITY)
Admission: RE | Admit: 2024-02-11 | Discharge: 2024-02-11 | Disposition: A | Source: Ambulatory Visit | Attending: Pulmonary Disease

## 2024-02-11 DIAGNOSIS — I5032 Chronic diastolic (congestive) heart failure: Secondary | ICD-10-CM

## 2024-02-11 NOTE — Telephone Encounter (Signed)
 NFN

## 2024-02-11 NOTE — Progress Notes (Signed)
 Daily Session Note  Patient Details  Name: Tura Roller MRN: [id number] Date of Birth: 27-Apr-1960 Referring Provider:   Conrad Ports Pulmonary Rehab Walk Test from 12/27/2023 in [facility]  Referring Provider Dewald    Encounter Date: 02/11/2024  Check In:  Session Check In - 02/11/24 1034       Check-In   Supervising physician immediately available to respond to emergencies [facility] MD immediately available    Physician(s) Orren Fabry, NP    Location [facility]    Staff Present Augustin Sharps, Neita Moats, MS, ACSM-CEP, Exercise Physiologist;Majel Giel Harvy, RN, BSN;Carlette Carlton, RN, BSN    Virtual Visit No    Medication changes reported     No    Fall or balance concerns reported    No    Tobacco Cessation No Change    Warm-up and Cool-down Performed as group-led instruction   only   Resistance Training Performed Yes    VAD Patient? No    PAD/SET Patient? No      Pain Assessment   Currently in Pain? No/denies    Multiple Pain Sites No          Capillary Blood Glucose: No results found. However, due to the size of the patient record, not all encounters were searched. Please check Results Review for a complete set of results.    Tobacco Use History[1]  Goals Met:  Exercise tolerated well Queuing for purse lip breathing No report of concerns or symptoms today Strength training completed today  Goals Unmet:  Not Applicable  Comments: Service time is from 1004 to 1141    Dr. Slater Staff is Medical Director for Pulmonary Rehab at [facility].     [1]  Social History Tobacco Use  Smoking Status Never  Smokeless Tobacco Never

## 2024-02-12 ENCOUNTER — Ambulatory Visit: Admitting: Bariatrics

## 2024-02-12 ENCOUNTER — Encounter: Payer: Self-pay | Admitting: Bariatrics

## 2024-02-12 ENCOUNTER — Ambulatory Visit: Payer: Self-pay

## 2024-02-12 VITALS — BP 114/78 | HR 78 | Ht 66.5 in | Wt 287.0 lb

## 2024-02-12 DIAGNOSIS — R7303 Prediabetes: Secondary | ICD-10-CM | POA: Diagnosis not present

## 2024-02-12 DIAGNOSIS — Z6841 Body Mass Index (BMI) 40.0 and over, adult: Secondary | ICD-10-CM

## 2024-02-12 NOTE — Progress Notes (Signed)
 WEIGHT SUMMARY AND BIOMETRICS  Weight Lost Since Last Visit: 0  Weight Gained Since Last Visit: 1lb   Vitals BP: 114/78 Pulse Rate: 78 SpO2: 100 %   Anthropometric Measurements Height: 5' 6.5 (1.689 m) Weight: 287 lb (130.2 kg) BMI (Calculated): 45.63 Weight at Last Visit: 286lb Weight Lost Since Last Visit: 0 Weight Gained Since Last Visit: 1lb Starting Weight: 308lb Total Weight Loss (lbs): 21 lb (9.526 kg)   Body Composition  Body Fat %: 56.3 % Fat Mass (lbs): 161.8 lbs Muscle Mass (lbs): 119.2 lbs Visceral Fat Rating : 20   Other Clinical Data Fasting: no Labs: no Today's Visit #: 6 Starting Date: 10/17/23    OBESITY Victoria [name] is here to discuss her progress with her obesity treatment plan along with follow-up of her obesity related diagnoses.    Nutrition Plan: the Category 3 plan - 0% adherence.  Current exercise: physical therapy/stationary bike and walking  Interim History:  She is up 1 lb since her last visit.  She states that she has seen her pulmonologist and they states that she will probably be oxygen dependent during the day.  She has diastolic heart failure and pulmonary hypertension.  She would really like to find a medication that would be helpful but her insurance coverage is poor.  States that her pulmonologist prescribed Zepbound  but she does not have coverage.  She had been on Topamax  in the past but had been taken off that per her cardiologist.  She had been on Wellbutrin  in the past without any reduction in cravings. Eating all of the food on the plan., Protein intake is as prescribed, Is not skipping meals, and Water  intake is adequate.   Pharmacotherapy: Victoria [name] is not on any anti-obesity medications.  I looked at her medications her diagnoses and cannot really find a suitable antiobesity medication for her.  She has extensive GI  issues including GERD, lymphocytic colitis, and post gastric surgery syndrome.  Her insurance will not cover GLP-1's and she might not be able to tolerate as well.  She would not be a good candidate for metformin.  She has tried Wellbutrin  in the past without help for cravings and has been on Topamax  in the past without any significant change in craving.  I would want not want to start naltrexone for her as she may need pain medication and occasionally does take pain medication.  Hunger is moderately controlled.  Cravings are moderately controlled.  Assessment/Plan:   Prediabetes Last A1c was 5.9  Medication(s): no medications. Zepbound  was prescribed by another provider but her insurance would not cover.  Lab Results  Component Value Date   HGBA1C 5.9 (H) 01/06/2024   HGBA1C 5.6 10/17/2023   HGBA1C 6.0 07/04/2023   HGBA1C 6.1 (H) 12/20/2022   HGBA1C 5.7 (H) 07/05/2022   Lab Results  Component Value Date   INSULIN  10.0 10/17/2023  INSULIN  7.1 05/24/2022   INSULIN  5.4 02/20/2022   INSULIN  10.0 11/20/2021   INSULIN  6.2 07/07/2020    Plan: Will minimize all refined carbohydrates both sweets and starches.  Will work on the plan and exercise.  Consider both aerobic and resistance training.  Will keep protein, water , and fiber intake high.  Increase Polyunsaturated and Monounsaturated fats to increase satiety and encourage weight loss.  Will be more consistent in her journaling. Will plan her meals at home. Will stick to her plan 85 to 95%.    Morbid Obesity: Current BMI BMI (Calculated): 45.63   Pharmacotherapy Plan No anti-obesity medications.   Victoria [name] is currently in the action stage of change. As such, her goal is to continue with weight loss efforts.  She has agreed to the pescatarian plan.  She states that she cannot drink protein shakes secondary to GI issues.   Exercise goals: All adults should avoid inactivity. Some physical activity is better than none, and adults who  participate in any amount of physical activity gain some health benefits.  Behavioral modification strategies: increasing lean protein intake, meal planning , increase water  intake, better snacking choices, planning for success, increasing vegetables, avoiding temptations, keep healthy foods in the home, weigh protein portions, measure portion sizes, and mindful eating.  Victoria [name] has agreed to follow-up with our clinic in 4 weeks with [facility].    Objective:   VITALS: Per patient if applicable, see vitals. GENERAL: Alert and in no acute distress. CARDIOPULMONARY: No increased WOB. Speaking in clear sentences.  PSYCH: Pleasant and cooperative. Speech normal rate and rhythm. Affect is appropriate. Insight and judgement are appropriate. Attention is focused, linear, and appropriate.  NEURO: Oriented as arrived to appointment on time with no prompting.   Attestation Statements:   This was prepared with the assistance of Engineer, Civil (consulting).  Occasional wrong-word or sound-a-like substitutions may have occurred due to the inherent limitations of voice recognition.   Clayborne Daring, DO

## 2024-02-13 ENCOUNTER — Encounter (HOSPITAL_COMMUNITY): Admission: RE | Admit: 2024-02-13 | Discharge: 2024-02-13 | Attending: Pulmonary Disease

## 2024-02-13 ENCOUNTER — Encounter: Payer: Self-pay | Admitting: Pulmonary Disease

## 2024-02-13 DIAGNOSIS — I5032 Chronic diastolic (congestive) heart failure: Secondary | ICD-10-CM | POA: Diagnosis not present

## 2024-02-13 MED ORDER — ZEPBOUND 2.5 MG/0.5ML ~~LOC~~ SOAJ: 2.5000 mg | SUBCUTANEOUS | 0 refills | Status: DC

## 2024-02-13 NOTE — Progress Notes (Signed)
 Daily Session Note  Patient Details  Name: Victoria [name] MRN: [id number] Date of Birth: 07-19-1960 Referring Provider:   Conrad Ports Pulmonary Rehab Walk Test from 12/27/2023 in [facility]  Referring Provider Dewald    Encounter Date: 02/13/2024  Check In:  Session Check In - 02/13/24 1019       Check-In   Supervising physician immediately available to respond to emergencies [facility] MD immediately available    Physician(s) Barnie Press, NP    Location [facility]    Staff Present Augustin Sharps, Neita Moats, MS, ACSM-CEP, Exercise Physiologist;Brandonlee Navis Harvy, RN, BSN;Randi Reeve BS, ACSM-CEP, Exercise Physiologist    Virtual Visit No    Medication changes reported     No    Fall or balance concerns reported    No    Tobacco Cessation No Change    Warm-up and Cool-down Performed as group-led instruction   only   Resistance Training Performed Yes    VAD Patient? No    PAD/SET Patient? No      Pain Assessment   Currently in Pain? No/denies    Multiple Pain Sites No          Capillary Blood Glucose: No results found. However, due to the size of the patient record, not all encounters were searched. Please check Results Review for a complete set of results.    Tobacco Use History[1]  Goals Met:  Independence with exercise equipment Exercise tolerated well Queuing for purse lip breathing No report of concerns or symptoms today Strength training completed today  Goals Unmet:  Not Applicable  Comments: Service time is from 1008 to 1141    Dr. Slater Staff is Medical Director for Pulmonary Rehab at [facility].     [1]  Social History Tobacco Use  Smoking Status Never  Smokeless Tobacco Never

## 2024-02-18 ENCOUNTER — Other Ambulatory Visit

## 2024-02-18 ENCOUNTER — Encounter (HOSPITAL_COMMUNITY)
Admission: RE | Admit: 2024-02-18 | Discharge: 2024-02-18 | Disposition: A | Discharge status: Home / Self Care | Source: Ambulatory Visit | Attending: Pulmonary Disease | Admitting: Pulmonary Disease

## 2024-02-18 ENCOUNTER — Encounter: Payer: Self-pay | Admitting: Pulmonary Disease

## 2024-02-18 ENCOUNTER — Telehealth: Payer: Self-pay

## 2024-02-18 VITALS — Wt 292.8 lb

## 2024-02-18 DIAGNOSIS — I5032 Chronic diastolic (congestive) heart failure: Secondary | ICD-10-CM | POA: Diagnosis not present

## 2024-02-18 DIAGNOSIS — E039 Hypothyroidism, unspecified: Secondary | ICD-10-CM | POA: Diagnosis not present

## 2024-02-18 NOTE — Addendum Note (Signed)
 Addended by: Vernel Donlan P on: 02/18/2024 12:14 PM   Modules accepted: Orders

## 2024-02-18 NOTE — Progress Notes (Signed)
 Daily Session Note  Patient Details  Name: Victoria [name] MRN: [id number] Date of Birth: Nov 26, 1960 Referring Provider:   Conrad Ports Pulmonary Rehab Walk Test from 12/27/2023 in [facility]  Referring Provider Dewald    Encounter Date: 02/18/2024  Check In:  Session Check In - 02/18/24 1026       Check-In   Supervising physician immediately available to respond to emergencies [facility] MD immediately available    Physician(s) Barnie Press, NP    Location [facility]    Staff Present Augustin Sharps, Candia Levin, RN, BSN;Randi Reeve BS, ACSM-CEP, Exercise Physiologist    Virtual Visit No    Medication changes reported     No    Fall or balance concerns reported    No    Tobacco Cessation No Change    Warm-up and Cool-down Performed as group-led instruction   only   Resistance Training Performed Yes    VAD Patient? No    PAD/SET Patient? No      Pain Assessment   Currently in Pain? No/denies    Multiple Pain Sites No          Capillary Blood Glucose: No results found. However, due to the size of the patient record, not all encounters were searched. Please check Results Review for a complete set of results.   Exercise Prescription Changes - 02/18/24 1200       Response to Exercise   Blood Pressure (Admit) 100/70    Blood Pressure (Exercise) 110/64    Blood Pressure (Exit) 100/80    Heart Rate (Admit) 64 bpm    Heart Rate (Exercise) 90 bpm    Heart Rate (Exit) 70 bpm    Oxygen Saturation (Admit) 99 %   2L   Oxygen Saturation (Exercise) 94 %   2L   Oxygen Saturation (Exit) 96 %   2L   Rating of Perceived Exertion (Exercise) 12    Perceived Dyspnea (Exercise) 3    Duration Continue with 30 min of aerobic exercise without signs/symptoms of physical distress.    Intensity THRR unchanged      Progression   Progression Continue to progress workloads to maintain intensity without  signs/symptoms of physical distress.      Resistance Training   Weight red bands    Reps 10-15    Time 10 Minutes      Oxygen   Oxygen Continuous    Liters 2      NuStep   Level 2    SPM 78    Minutes 15    METs 2      Track   Laps 7    Minutes 15    METs 2.08      Oxygen   Maintain Oxygen Saturation 88% or higher          Tobacco Use History[1]  Goals Met:  Exercise tolerated well Queuing for purse lip breathing No report of concerns or symptoms today Strength training completed today  Goals Unmet:  Not Applicable  Comments: Service time is from 1001 to 1130    Dr. Slater Staff is Medical Director for Pulmonary Rehab at [facility].     [1]  Social History Tobacco Use  Smoking Status Never  Smokeless Tobacco Never

## 2024-02-18 NOTE — Telephone Encounter (Signed)
 NFN

## 2024-02-19 ENCOUNTER — Ambulatory Visit: Payer: Self-pay | Admitting: Internal Medicine

## 2024-02-19 LAB — BRAIN NATRIURETIC PEPTIDE: Brain Natriuretic Peptide: 26 pg/mL

## 2024-02-19 LAB — TSH: TSH: 4.64 m[IU]/L — ABNORMAL HIGH (ref 0.40–4.50)

## 2024-02-19 MED ORDER — LEVOTHYROXINE SODIUM 88 MCG PO TABS: 88.0000 ug | ORAL_TABLET | Freq: Every day | ORAL | 1 refills | Status: AC

## 2024-02-19 NOTE — Progress Notes (Signed)
 Pulmonary Individual Treatment Plan  Patient Details  Name: Victoria [name] MRN: [id number] Date of Birth: January 02, 1961 Referring Provider:   Conrad Ports Pulmonary Rehab Walk Test from 12/27/2023 in [facility]  Referring Provider Dewald    Initial Encounter Date:  Flowsheet Row Pulmonary Rehab Walk Test from 12/27/2023 in [facility]  Date 12/27/23    Visit Diagnosis: Heart failure, diastolic, chronic (HCC)  Patient's Home Medications on Admission:  Current Medications[1]  Past Medical History: Past Medical History:  Diagnosis Date   Allergy  approx-5years ago   estradiol ,topical patch only-rash   Anxiety    Back pain    Blood transfusion without reported diagnosis 2010   after hip replacement   Bursitis    right shoulder   Cataract September 2019   bilateral, lasik   Chronic diastolic heart failure (HCC)    Chronic kidney disease    hematuria, has been worked up, her normal   Chronic leukopenia    intermittant since 2010, followed by Dr. Perri now   Chronic neck pain    Constipation    Degenerative joint disease of low back 09/02/2015   Dr. Hiram   Diastolic heart failure [facility])    Displacement of lumbar intervertebral disc    Dysrhythmia    Tachycardia, PVCs, PACs   Edema of both lower legs    Fibrocystic breast    Gallbladder problem    GERD (gastroesophageal reflux disease)    Headache    per pt more stress/tension   Heart palpitations    SEE EPIC ENCOUTNER , CARDIOLOGY DR. WILBERT TURNER 2018; reports on 05-16-17  i haven't had any bouts of those lately     Hemorrhoids    Hidradenitis suppurativa    History of Clostridium difficile infection 12/2010   History of exercise intolerance    ETT on 04-27-2016-- negative ([facility] treadmill score 7)   History of Helicobacter pylori infection 2001 and 09/ 2012   HSV-2 infection    genital   Hx of  adenomatous colonic polyps 10/17/2007   Hydradenitis    per pt currently treated with Humira  injection   Hypertension    Hyperthyroidism    Hypocupremia    Hypothyroidism    Hypothyroidism, postsurgical 1980   IBS (irritable bowel syndrome)    Insomnia    Iron  deficiency    Joint pain    Leukopenia    Lumbosacral radiculopathy    Lymphocytic colitis 05/14/2022   dx from colonoscopy   Medial meniscus tear    right knee   Mild obstructive sleep apnea    Mitral regurgitation    Mild by echo 06/2023   Numbness and tingling of right arm    Obesity    Osteoarthritis    Palpitations    Pernicious anemia    b12 def   Peroneal neuropathy    PONV (postoperative nausea and vomiting)    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   Prediabetes    Pulmonary hypertension (HCC)    Reactive hypoglycemia followed by pcp   post gastrectomy dumping syndrome   SOB (shortness of breath)    Spinal stenosis    Tendonitis    right shoulder   Tricuspid regurgitation    Mild to moderate echo 06/2023   Varicose veins    Vertigo    Vitamin B 12 deficiency    Vitamin D  deficiency     Tobacco Use:  Tobacco Use History[2]  Labs: Review Flowsheet  More data exists      Latest Ref Rng & Units 12/20/2022 07/04/2023 10/17/2023 11/15/2023 01/06/2024  Labs for ITP Cardiac and Pulmonary Rehab  Cholestrol <200 mg/dL 796  779  775  - 740   LDL (calc) mg/dL (calc) 893  883  878  - 146   HDL-C > OR = 50 mg/dL 83  10.59  92  - 97   Trlycerides <150 mg/dL 62  26.9  64  - 69   Hemoglobin A1c <5.7 % 6.1  6.0  5.6  - 5.9   Bicarbonate 20.0 - 28.0 mmol/L - - - 24.0  23.9  -  TCO2 22 - 32 mmol/L - - - 25  25  -  Acid-base deficit 0.0 - 2.0 mmol/L - - - 1.0  1.0  -  O2 Saturation % - - - 75  76  -    Details       Multiple values from one day are sorted in reverse-chronological order         Capillary Blood Glucose: Lab Results  Component Value Date   GLUCAP 251 (H) 07/16/2022   GLUCAP 109 (H)  07/02/2022     Pulmonary Assessment Scores:  Pulmonary Assessment Scores     Row Name 12/27/23 1128 02/13/24 1029       ADL UCSD   ADL Phase Entry Exit    SOB Score total 90 70      CAT Score   CAT Score 26 27      mMRC Score   mMRC Score 4 --      UCSD: Self-administered rating of dyspnea associated with activities of daily living (ADLs) 6-point scale (0 = not at all to 5 = maximal or unable to do because of breathlessness)  Scoring Scores range from 0 to 120.  Minimally important difference is 5 units  CAT: CAT can identify the health impairment of COPD patients and is better correlated with disease progression.  CAT has a scoring range of zero to 40. The CAT score is classified into four groups of low (less than 10), medium (10 - 20), high (21-30) and very high (31-40) based on the impact level of disease on health status. A CAT score over 10 suggests significant symptoms.  A worsening CAT score could be explained by an exacerbation, poor medication adherence, poor inhaler technique, or progression of COPD or comorbid conditions.  CAT MCID is 2 points  mMRC: mMRC (Modified Medical Research Council) Dyspnea Scale is used to assess the degree of baseline functional disability in patients of respiratory disease due to dyspnea. No minimal important difference is established. A decrease in score of 1 point or greater is considered a positive change.   Pulmonary Function Assessment:  Pulmonary Function Assessment - 12/27/23 1212       Breath   Bilateral Breath Sounds Clear    Shortness of Breath Yes;Fear of Shortness of Breath;Limiting activity          Exercise Target Goals: Exercise Program Goal: Individual exercise prescription set using results from initial 6 min walk test and THRR while considering  patients activity barriers and safety.   Exercise Prescription Goal: Initial exercise prescription builds to 30-45 minutes a day of aerobic activity, 2-3 days  per week.  Home exercise guidelines will be given to patient during program as part of exercise prescription that the participant will acknowledge.  Activity Barriers & Risk Stratification:  Activity Barriers &  Cardiac Risk Stratification - 12/27/23 1117       Activity Barriers & Cardiac Risk Stratification   Activity Barriers Deconditioning;Muscular Weakness;Shortness of Breath;Left Knee Replacement;Right Knee Replacement;Back Problems          6 Minute Walk:  6 Minute Walk     Row Name 12/27/23 1241 02/13/24 1200       6 Minute Walk   Phase Initial Discharge    Distance 645 feet 980 feet    Distance % Change -- 51.94 %    Distance Feet Change -- 335 ft    Walk Time 6 minutes 6 minutes    # of Rest Breaks 1  1:16-2:20 0    MPH 1.22 1.86    METS 0.81 1.66    RPE 15 15    Perceived Dyspnea  2 3    VO2 Peak 2.85 5.82    Symptoms No No    Resting HR 62 bpm 74 bpm    Resting BP 112/62 104/60    Resting Oxygen Saturation  98 % 100 %    Exercise Oxygen Saturation  during 6 min walk 86 % 97 %    Max Ex. HR 64 bpm 106 bpm    Max Ex. BP 126/86 116/64    2 Minute Post BP 111/84  automatic 108/62      Interval HR   1 Minute HR 78 86    2 Minute HR 75 96    3 Minute HR 71 99    4 Minute HR 78 102    5 Minute HR 74 104    6 Minute HR 72 106    2 Minute Post HR 64 92    Interval Heart Rate? Yes Yes      Interval Oxygen   Interval Oxygen? Yes Yes    Baseline Oxygen Saturation % 98 % 100 %    1 Minute Oxygen Saturation % 93 % 100 %    1 Minute Liters of Oxygen 0 L 2 L    2 Minute Oxygen Saturation % 96 % 99 %    2 Minute Liters of Oxygen 0 L 2 L    3 Minute Oxygen Saturation % 92 % 97 %    3 Minute Liters of Oxygen 0 L 2 L    4 Minute Oxygen Saturation % 86 % 99 %    4 Minute Liters of Oxygen 0 L  placed on 2L 2 L    5 Minute Oxygen Saturation % 98 % 99 %    5 Minute Liters of Oxygen 2 L 2 L    6 Minute Oxygen Saturation % 93 % 100 %    6 Minute Liters of Oxygen 2 L 2  L    2 Minute Post Oxygen Saturation % 100 % 100 %    2 Minute Post Liters of Oxygen 2 L 2 L       Oxygen Initial Assessment:  Oxygen Initial Assessment - 12/27/23 1119       Home Oxygen   Home Oxygen Device Home Concentrator;Portable Concentrator    Sleep Oxygen Prescription CPAP    Liters per minute 0    Home Exercise Oxygen Prescription Continuous    Liters per minute 2    Home Resting Oxygen Prescription None    Compliance with Home Oxygen Use Yes      Intervention   Short Term Goals To learn and exhibit compliance with exercise, home and travel O2 prescription;To learn and  understand importance of maintaining oxygen saturations>88%;To learn and demonstrate proper use of respiratory medications;To learn and demonstrate proper pursed lip breathing techniques or other breathing techniques. ;To learn and understand importance of monitoring SPO2 with pulse oximeter and demonstrate accurate use of the pulse oximeter.    Long  Term Goals Maintenance of O2 saturations>88%;Exhibits compliance with exercise, home  and travel O2 prescription;Exhibits proper breathing techniques, such as pursed lip breathing or other method taught during program session;Verbalizes importance of monitoring SPO2 with pulse oximeter and return demonstration;Compliance with respiratory medication;Demonstrates proper use of MDIs          Oxygen Re-Evaluation:  Oxygen Re-Evaluation     Row Name 12/27/23 1119 01/17/24 0950 02/07/24 0906         Program Oxygen Prescription   Program Oxygen Prescription Continuous Continuous Continuous     Liters per minute 2 2 2        Home Oxygen   Home Oxygen Device -- Home Concentrator;Portable Concentrator Home Concentrator;Portable Concentrator     Sleep Oxygen Prescription -- CPAP CPAP     Liters per minute -- 0 0     Home Exercise Oxygen Prescription -- Continuous Continuous     Liters per minute -- 2 2     Home Resting Oxygen Prescription -- None None      Compliance with Home Oxygen Use -- Yes Yes       Goals/Expected Outcomes   Short Term Goals -- To learn and exhibit compliance with exercise, home and travel O2 prescription;To learn and understand importance of maintaining oxygen saturations>88%;To learn and demonstrate proper use of respiratory medications;To learn and demonstrate proper pursed lip breathing techniques or other breathing techniques. ;To learn and understand importance of monitoring SPO2 with pulse oximeter and demonstrate accurate use of the pulse oximeter. To learn and exhibit compliance with exercise, home and travel O2 prescription;To learn and understand importance of maintaining oxygen saturations>88%;To learn and demonstrate proper use of respiratory medications;To learn and demonstrate proper pursed lip breathing techniques or other breathing techniques. ;To learn and understand importance of monitoring SPO2 with pulse oximeter and demonstrate accurate use of the pulse oximeter.     Long  Term Goals -- Maintenance of O2 saturations>88%;Exhibits compliance with exercise, home  and travel O2 prescription;Exhibits proper breathing techniques, such as pursed lip breathing or other method taught during program session;Verbalizes importance of monitoring SPO2 with pulse oximeter and return demonstration;Compliance with respiratory medication;Demonstrates proper use of MDIs Maintenance of O2 saturations>88%;Exhibits compliance with exercise, home  and travel O2 prescription;Exhibits proper breathing techniques, such as pursed lip breathing or other method taught during program session;Verbalizes importance of monitoring SPO2 with pulse oximeter and return demonstration;Compliance with respiratory medication;Demonstrates proper use of MDIs     Goals/Expected Outcomes -- Compliance and understanding of oxygen saturation monitoring and breathing techniques to decrease shortness of breath. Compliance and understanding of oxygen saturation  monitoring and breathing techniques to decrease shortness of breath.        Oxygen Discharge (Final Oxygen Re-Evaluation):  Oxygen Re-Evaluation - 02/07/24 0906       Program Oxygen Prescription   Program Oxygen Prescription Continuous    Liters per minute 2      Home Oxygen   Home Oxygen Device Home Concentrator;Portable Concentrator    Sleep Oxygen Prescription CPAP    Liters per minute 0    Home Exercise Oxygen Prescription Continuous    Liters per minute 2    Home Resting Oxygen Prescription None  Compliance with Home Oxygen Use Yes      Goals/Expected Outcomes   Short Term Goals To learn and exhibit compliance with exercise, home and travel O2 prescription;To learn and understand importance of maintaining oxygen saturations>88%;To learn and demonstrate proper use of respiratory medications;To learn and demonstrate proper pursed lip breathing techniques or other breathing techniques. ;To learn and understand importance of monitoring SPO2 with pulse oximeter and demonstrate accurate use of the pulse oximeter.    Long  Term Goals Maintenance of O2 saturations>88%;Exhibits compliance with exercise, home  and travel O2 prescription;Exhibits proper breathing techniques, such as pursed lip breathing or other method taught during program session;Verbalizes importance of monitoring SPO2 with pulse oximeter and return demonstration;Compliance with respiratory medication;Demonstrates proper use of MDIs    Goals/Expected Outcomes Compliance and understanding of oxygen saturation monitoring and breathing techniques to decrease shortness of breath.          Initial Exercise Prescription:  Initial Exercise Prescription - 12/27/23 1200       Date of Initial Exercise RX and Referring Provider   Date 12/27/23    Referring Provider Dewald    Expected Discharge Date 03/31/24      Oxygen   Oxygen Continuous    Liters 2    Maintain Oxygen Saturation 88% or higher      NuStep   Level 1     SPM 55    Minutes 30    METs 1.4      Prescription Details   Frequency (times per week) 2    Duration Progress to 30 minutes of continuous aerobic without signs/symptoms of physical distress      Intensity   THRR 40-80% of Max Heartrate 63-126    Ratings of Perceived Exertion 11-13    Perceived Dyspnea 0-4      Progression   Progression Continue progressive overload as per policy without signs/symptoms or physical distress.      Resistance Training   Training Prescription Yes    Weight red bands    Reps 10-15          Perform Capillary Blood Glucose checks as needed.  Exercise Prescription Changes:   Exercise Prescription Changes     Row Name 01/07/24 1100 01/21/24 1200 01/30/24 1116 02/18/24 1200       Response to Exercise   Blood Pressure (Admit) 104/70 125/89 110/78 100/70    Blood Pressure (Exercise) 124/80 120/78 -- 110/64    Blood Pressure (Exit) 104/80 100/60 102/70 100/80    Heart Rate (Admit) 74 bpm 74 bpm 71 bpm 64 bpm    Heart Rate (Exercise) 80 bpm 97 bpm 77 bpm 90 bpm    Heart Rate (Exit) 73 bpm 84 bpm 74 bpm 70 bpm    Oxygen Saturation (Admit) 100 %  2L 98 % 99 %  2L 99 %  2L    Oxygen Saturation (Exercise) 97 %  2L 98 % 99 %  2L 94 %  2L    Oxygen Saturation (Exit) 100 %  2L 98 % 99 %  RA 96 %  2L    Rating of Perceived Exertion (Exercise) 11 11 13 12     Perceived Dyspnea (Exercise) 1 1 2 3     Duration Continue with 30 min of aerobic exercise without signs/symptoms of physical distress. Continue with 30 min of aerobic exercise without signs/symptoms of physical distress. Continue with 30 min of aerobic exercise without signs/symptoms of physical distress. Continue with 30 min of aerobic exercise without signs/symptoms  of physical distress.    Intensity THRR unchanged THRR unchanged THRR unchanged THRR unchanged      Progression   Progression Continue to progress workloads to maintain intensity without signs/symptoms of physical distress. Continue to  progress workloads to maintain intensity without signs/symptoms of physical distress. Continue to progress workloads to maintain intensity without signs/symptoms of physical distress. Continue to progress workloads to maintain intensity without signs/symptoms of physical distress.      Resistance Training   Training Prescription Yes Yes Yes --    Weight red bands red bands red bands red bands    Reps 10-15 10-15 10-15 10-15    Time 10 Minutes 10 Minutes 10 Minutes 10 Minutes      Oxygen   Oxygen Continuous Continuous Continuous Continuous    Liters 2 2 2 2       NuStep   Level 1 1 2 2     SPM -- 78 -- 78    Minutes 30 30 30 15     METs 1.7 1.9 1.9 2      Track   Laps -- -- -- 7    Minutes -- -- -- 15    METs -- -- -- 2.08      Oxygen   Maintain Oxygen Saturation 88% or higher 88% or higher 88% or higher 88% or higher       Exercise Comments:   Exercise Comments     Row Name 01/17/24 0950 01/28/24 1154         Exercise Comments Pt completed 4th day of exercise. She exercises for 30 min on the Nustep averaging 1.9 METs. Aimar performs the warmup and cooldown standing holding onto a chair for balance. Discussed METs. Completed home exercise plan. Vernell is not currently exercising at home. She does have exercise equipment at home. Craig wants to get back to walking on the treadmill. I encouraged her to walk outside or around a grocery store. Recommended exercising 1 non-rehab day/wk for 30 min or 2 x 15 min/day. She agreed with my recommendations. Toria seems motivated to exercise. She mentioned how she wants to get back to normal.         Exercise Goals and Review:   Exercise Goals     Row Name 12/27/23 1119             Exercise Goals   Increase Physical Activity Yes       Intervention Provide advice, education, support and counseling about physical activity/exercise needs.;Develop an individualized exercise prescription for aerobic and resistive training based on  initial evaluation findings, risk stratification, comorbidities and participant's personal goals.       Expected Outcomes Short Term: Attend rehab on a regular basis to increase amount of physical activity.;Long Term: Exercising regularly at least 3-5 days a week.;Long Term: Add in home exercise to make exercise part of routine and to increase amount of physical activity.       Increase Strength and Stamina Yes       Intervention Provide advice, education, support and counseling about physical activity/exercise needs.;Develop an individualized exercise prescription for aerobic and resistive training based on initial evaluation findings, risk stratification, comorbidities and participant's personal goals.       Expected Outcomes Short Term: Increase workloads from initial exercise prescription for resistance, speed, and METs.;Short Term: Perform resistance training exercises routinely during rehab and add in resistance training at home;Long Term: Improve cardiorespiratory fitness, muscular endurance and strength as measured by increased METs and functional capacity ( )  Able to understand and use rate of perceived exertion (RPE) scale Yes       Intervention Provide education and explanation on how to use RPE scale       Expected Outcomes Short Term: Able to use RPE daily in rehab to express subjective intensity level;Long Term:  Able to use RPE to guide intensity level when exercising independently       Able to understand and use Dyspnea scale Yes       Intervention Provide education and explanation on how to use Dyspnea scale       Expected Outcomes Short Term: Able to use Dyspnea scale daily in rehab to express subjective sense of shortness of breath during exertion;Long Term: Able to use Dyspnea scale to guide intensity level when exercising independently       Knowledge and understanding of Target Heart Rate Range (THRR) Yes       Intervention Provide education and explanation of THRR  including how the numbers were predicted and where they are located for reference       Expected Outcomes Short Term: Able to state/look up THRR;Short Term: Able to use daily as guideline for intensity in rehab;Long Term: Able to use THRR to govern intensity when exercising independently       Understanding of Exercise Prescription Yes       Intervention Provide education, explanation, and written materials on patient's individual exercise prescription       Expected Outcomes Short Term: Able to explain program exercise prescription;Long Term: Able to explain home exercise prescription to exercise independently          Exercise Goals Re-Evaluation :  Exercise Goals Re-Evaluation     Row Name 01/17/24 0951 02/07/24 0901           Exercise Goal Re-Evaluation   Exercise Goals Review Increase Physical Activity;Able to understand and use Dyspnea scale;Understanding of Exercise Prescription;Increase Strength and Stamina;Knowledge and understanding of Target Heart Rate Range (THRR);Able to understand and use rate of perceived exertion (RPE) scale Increase Physical Activity;Able to understand and use Dyspnea scale;Understanding of Exercise Prescription;Increase Strength and Stamina;Knowledge and understanding of Target Heart Rate Range (THRR);Able to understand and use rate of perceived exertion (RPE) scale      Comments Nakaiya has completed 4 exercise sessions. She exercises for 30 min on the Nustep. She averages 1.9 METs at level 1 on the Nustep. Landree performs the warmup and cooldown standing holding onto a chair for balance. It is too soon to notate any discernable progressions. Will continue to monitor and progress as able. Evita has completed 8 exercise sessions. She exercises for 30 min on the Nustep. She averages 2.0 METs at level 2 on the Nustep. Tynesha performs the warmup and cooldown standing holding onto a chair for balance. Embry has increased her level on the Nustep as METs have also  increased. Will plan to have her walk soon. We have discussed home exercise recenlty. Vy is not exercising at home as I encouraged her to. Will continue to monitor and progress as able.      Expected Outcomes Through exercise at rehab and home, the patient will decrease shortness of breath with daily activities and feel confident in carrying out an exercise regimen at home. Through exercise at rehab and home, the patient will decrease shortness of breath with daily activities and feel confident in carrying out an exercise regimen at home.         Discharge Exercise Prescription (Final Exercise Prescription  Changes):  Exercise Prescription Changes - 02/18/24 1200       Response to Exercise   Blood Pressure (Admit) 100/70    Blood Pressure (Exercise) 110/64    Blood Pressure (Exit) 100/80    Heart Rate (Admit) 64 bpm    Heart Rate (Exercise) 90 bpm    Heart Rate (Exit) 70 bpm    Oxygen Saturation (Admit) 99 %   2L   Oxygen Saturation (Exercise) 94 %   2L   Oxygen Saturation (Exit) 96 %   2L   Rating of Perceived Exertion (Exercise) 12    Perceived Dyspnea (Exercise) 3    Duration Continue with 30 min of aerobic exercise without signs/symptoms of physical distress.    Intensity THRR unchanged      Progression   Progression Continue to progress workloads to maintain intensity without signs/symptoms of physical distress.      Resistance Training   Weight red bands    Reps 10-15    Time 10 Minutes      Oxygen   Oxygen Continuous    Liters 2      NuStep   Level 2    SPM 78    Minutes 15    METs 2      Track   Laps 7    Minutes 15    METs 2.08      Oxygen   Maintain Oxygen Saturation 88% or higher          Nutrition:  Target Goals: Understanding of nutrition guidelines, daily intake of sodium 1500mg , cholesterol 200mg , calories 30% from fat and 7% or less from saturated fats, daily to have 5 or more servings of fruits and  vegetables.  Biometrics:    Nutrition Therapy Plan and Nutrition Goals:  Nutrition Therapy & Goals - 01/07/24 1117       Nutrition Therapy   Diet Low Sodium      Personal Nutrition Goals   Nutrition Goal Patient to identify strategies for weight loss with goal of 0.5-2 # per week of weight loss.    Personal Goal #2 Patient to limit sodium intake to 1500 mg/day.    Personal Goal #3 Patient to improve diet quality by using the plate method as a guide for meal planning to include lean protein/plant protein, fruits, vegetables, whole grains, nonfat dairy as part of a well-balanced diet.    Comments Pt with history of pulmonary hypertension with chronic hypoxemia and diastolic heart failure. PMH also significant for CKD, pre-diabetes, s/p gastric bypass, and lymphocytic colitis. Pt reports being instructed to consume 90 g protein daily by MD; also instructed to avoid artificial sweeteners as may exacerbate colitis symptoms. Pt notes difficulty consuming adequate protein due to inability to consume foods with artificial sweeteners such as light yogurt and protein shakes such as Premier. RD provided list of lean protein sources as well as suggestions for alternative protein shakes and smoothie recipes. Pt notes receiving instruction to follow low sodium diet; often prepares meals in home.      Intervention Plan   Intervention Prescribe, educate and counsel regarding individualized specific dietary modifications aiming towards targeted core components such as weight, hypertension, lipid management, diabetes, heart failure and other comorbidities.;Nutrition handout(s) given to patient.   Handouts: High Protein Food List, Weight Loss Considerations   Expected Outcomes Short Term Goal: Understand basic principles of dietary content, such as calories, fat, sodium, cholesterol and nutrients.;Long Term Goal: Adherence to prescribed nutrition plan.  Nutrition Assessments:  MEDIFICTS Score  Key: >=70 Need to make dietary changes  40-70 Heart Healthy Diet <= 40 Therapeutic Level Cholesterol Diet   Picture Your Plate Scores: <59 Unhealthy dietary pattern with much room for improvement. 41-50 Dietary pattern unlikely to meet recommendations for good health and room for improvement. 51-60 More healthful dietary pattern, with some room for improvement.  >60 Healthy dietary pattern, although there may be some specific behaviors that could be improved.    Nutrition Goals Re-Evaluation:  Nutrition Goals Re-Evaluation     Row Name 02/06/24 1212             Goals   Current Weight 289 lb (131.1 kg)  02/04/2024       Nutrition Goal Patient to identify strategies for weight loss with goal of 0.5-2 # per week of weight loss.       Comment 01/06/2024- A1c 5.9%, Total Cholesterol 259, LDL 146       Expected Outcome Goals in action. Pt with history of pulmonary hypertension with chronic hypoxemia and diastolic heart failure. PMH also significant for CKD, pre-diabetes, s/p gastric bypass, and lymphocytic colitis. Pt reports being instructed to consume 90 g protein daily by MD; also instructed to avoid artificial sweeteners as may exacerbate colitis symptoms. Pt notes difficulty consuming adequate protein due to inability to consume foods with artificial sweeteners such as light yogurt and protein shakes such as Premier. Reports minimal appetite; notes decreased po intake. Pt denies trying previously suggested oral nutrition supplements due to possible GI side effects. Weight relatively stable over past month. Pt states insurance denied coverage of recently prescribed Zepbound . RD encouraged pt to consume small, frequent meals/snacks to help improve nutrient intake, maintain muscle mass. Dietary restrictions not appropriate at this time given minimal intake.         Personal Goal #2 Re-Evaluation   Personal Goal #2 Patient to limit sodium intake to 1500 mg/day.         Personal Goal #3  Re-Evaluation   Personal Goal #3 Patient to improve diet quality by using the plate method as a guide for meal planning to include lean protein/plant protein, fruits, vegetables, whole grains, nonfat dairy as part of a well-balanced diet.          Nutrition Goals Discharge (Final Nutrition Goals Re-Evaluation):  Nutrition Goals Re-Evaluation - 02/06/24 1212       Goals   Current Weight 289 lb (131.1 kg)   02/04/2024   Nutrition Goal Patient to identify strategies for weight loss with goal of 0.5-2 # per week of weight loss.    Comment 01/06/2024- A1c 5.9%, Total Cholesterol 259, LDL 146    Expected Outcome Goals in action. Pt with history of pulmonary hypertension with chronic hypoxemia and diastolic heart failure. PMH also significant for CKD, pre-diabetes, s/p gastric bypass, and lymphocytic colitis. Pt reports being instructed to consume 90 g protein daily by MD; also instructed to avoid artificial sweeteners as may exacerbate colitis symptoms. Pt notes difficulty consuming adequate protein due to inability to consume foods with artificial sweeteners such as light yogurt and protein shakes such as Premier. Reports minimal appetite; notes decreased po intake. Pt denies trying previously suggested oral nutrition supplements due to possible GI side effects. Weight relatively stable over past month. Pt states insurance denied coverage of recently prescribed Zepbound . RD encouraged pt to consume small, frequent meals/snacks to help improve nutrient intake, maintain muscle mass. Dietary restrictions not appropriate at this time given minimal intake.  Personal Goal #2 Re-Evaluation   Personal Goal #2 Patient to limit sodium intake to 1500 mg/day.      Personal Goal #3 Re-Evaluation   Personal Goal #3 Patient to improve diet quality by using the plate method as a guide for meal planning to include lean protein/plant protein, fruits, vegetables, whole grains, nonfat dairy as part of a well-balanced  diet.          Psychosocial: Target Goals: Acknowledge presence or absence of significant depression and/or stress, maximize coping skills, provide positive support system. Participant is able to verbalize types and ability to use techniques and skills needed for reducing stress and depression.  Initial Review & Psychosocial Screening:  Initial Psych Review & Screening - 12/27/23 1121       Initial Review   Current issues with Current Stress Concerns    Source of Stress Concerns Chronic Illness    Comments Pt down about chronic shortness of breath that limits her activity      Family Dynamics   Good Support System? Yes    Comments States she has good support from husband and family      Barriers   Psychosocial barriers to participate in program Psychosocial barriers identified (see note)      Screening Interventions   Interventions Encouraged to exercise;Provide feedback about the scores to participant    Expected Outcomes Long Term Goal: Stressors or current issues are controlled or eliminated.;Long Term goal: The participant improves quality of Life and PHQ9 Scores as seen by post scores and/or verbalization of changes;Short Term goal: Identification and review with participant of any Quality of Life or Depression concerns found by scoring the questionnaire.;Short Term goal: Utilizing psychosocial counselor, staff and physician to assist with identification of specific Stressors or current issues interfering with healing process. Setting desired goal for each stressor or current issue identified.          Quality of Life Scores:  Scores of 19 and below usually indicate a poorer quality of life in these areas.  A difference of  2-3 points is a clinically meaningful difference.  A difference of 2-3 points in the total score of the Quality of Life Index has been associated with significant improvement in overall quality of life, self-image, physical symptoms, and general health in  studies assessing change in quality of life.  PHQ-9: Review Flowsheet  More data exists      02/13/2024 12/27/2023 12/26/2023 07/08/2023 02/12/2023  Depression screen PHQ 2/9  Decreased Interest 1 2 1 2 3   Down, Depressed, Hopeless 2 1 1 1 2   PHQ - 2 Score 3 3 2 3 5   Altered sleeping 2 2 3 3 3   Tired, decreased energy 2 3 3 3 3   Change in appetite 2 2 2 3 3   Feeling bad or failure about yourself  2 0 0 0 2  Trouble concentrating 1 0 1 0 3  Moving slowly or fidgety/restless 1 0 1 3 3   Suicidal thoughts 0 0 0 0 0  PHQ-9 Score 13 10  12  15  22    Difficult doing work/chores Somewhat difficult Somewhat difficult Somewhat difficult Very difficult -    Details       Data saved with a previous flowsheet row definition        Interpretation of Total Score  Total Score Depression Severity:  1-4 = Minimal depression, 5-9 = Mild depression, 10-14 = Moderate depression, 15-19 = Moderately severe depression, 20-27 = Severe depression  Psychosocial Evaluation and Intervention:  Psychosocial Evaluation - 12/27/23 1122       Psychosocial Evaluation & Interventions   Interventions Encouraged to exercise with the program and follow exercise prescription    Comments Janique states she is stressed about her health and is hot that cardiology did not escalute her care and concerns sooner    Expected Outcomes For Fidelis to participate in Pulm Rehab with decreased health related stress and SOB    Continue Psychosocial Services  Follow up required by staff          Psychosocial Re-Evaluation:  Psychosocial Re-Evaluation     Row Name 01/15/24 1115 02/10/24 1447           Psychosocial Re-Evaluation   Current issues with Current Stress Concerns Current Stress Concerns      Comments Monthly psy/soc re-eval is as follows: Nguyet denies any new psy/soc barriers or concerns at this time. Staff will provide Anderia with education on healthy ways to reduce stress. No needs at this time.  Monthly psy/soc re-eval is as follows: Elenna is doing well with the program and seems to be a little less stressed about exercising. She continues to deny any new psy/soc barriers or concerns at this time.  No needs at this time.      Expected Outcomes For Melva to participate in PR with less stress. For Mileidy to participate in PR with less stress.      Interventions Encouraged to attend Pulmonary Rehabilitation for the exercise Encouraged to attend Pulmonary Rehabilitation for the exercise      Continue Psychosocial Services  Follow up required by staff Follow up required by staff         Psychosocial Discharge (Final Psychosocial Re-Evaluation):  Psychosocial Re-Evaluation - 02/10/24 1447       Psychosocial Re-Evaluation   Current issues with Current Stress Concerns    Comments Monthly psy/soc re-eval is as follows: Kamauri is doing well with the program and seems to be a little less stressed about exercising. She continues to deny any new psy/soc barriers or concerns at this time.  No needs at this time.    Expected Outcomes For Lavayah to participate in PR with less stress.    Interventions Encouraged to attend Pulmonary Rehabilitation for the exercise    Continue Psychosocial Services  Follow up required by staff          Education: Education Goals: Education classes will be provided on a weekly basis, covering required topics. Participant will state understanding/return demonstration of topics presented.  Learning Barriers/Preferences:  Learning Barriers/Preferences - 12/27/23 1125       Learning Barriers/Preferences   Learning Barriers None    Learning Preferences None          Education Topics: Know Your Numbers Group instruction that is supported by a PowerPoint presentation. Instructor discusses importance of knowing and understanding resting, exercise, and post-exercise oxygen saturation, heart rate, and blood pressure. Oxygen saturation, heart rate, blood pressure,  rating of perceived exertion, and dyspnea are reviewed along with a normal range for these values.  Flowsheet Row PULMONARY REHAB OTHER RESPIRATORY from 02/13/2024 in [facility]  Date 02/13/24  Educator EP  Instruction Review Code 1- Verbalizes Understanding    Exercise for the Pulmonary Patient Group instruction that is supported by a PowerPoint presentation. Instructor discusses benefits of exercise, core components of exercise, frequency, duration, and intensity of an exercise routine, importance of utilizing pulse  oximetry during exercise, safety while exercising, and options of places to exercise outside of rehab.  Flowsheet Row PULMONARY REHAB OTHER RESPIRATORY from 02/06/2024 in [facility], [facility]  Date 02/06/24  Educator EP  Instruction Review Code 1- Verbalizes Understanding    MET Level  Group instruction provided by PowerPoint, verbal discussion, and written material to support subject matter. Instructor reviews what METs are and how to increase METs.  Flowsheet Row PULMONARY REHAB OTHER RESPIRATORY from 01/02/2024 in [facility]  Date 01/02/24  Educator EP  Instruction Review Code 1- Verbalizes Understanding    Pulmonary Medications Verbally interactive group education provided by instructor with focus on inhaled medications and proper administration. Flowsheet Row PULMONARY REHAB OTHER RESPIRATORY from 01/30/2024 in [facility], [facility]ascular, [facility]  Date 01/30/24  Educator RT  Instruction Review Code 1- Verbalizes Understanding    Anatomy and Physiology of the Respiratory System Group instruction provided by PowerPoint, verbal discussion, and written material to support subject matter. Instructor reviews respiratory cycle and anatomical components of the respiratory  system and their functions. Instructor also reviews differences in obstructive and restrictive respiratory diseases with examples of each.    Oxygen Safety Group instruction provided by PowerPoint, verbal discussion, and written material to support subject matter. There is an overview of What is Oxygen and Why do we need it.  Instructor also reviews how to create a safe environment for oxygen use, the importance of using oxygen as prescribed, and the risks of noncompliance. There is a brief discussion on traveling with oxygen and resources the patient may utilize.   Oxygen Use Group instruction provided by PowerPoint, verbal discussion, and written material to discuss how supplemental oxygen is prescribed and different types of oxygen supply systems. Resources for more information are provided.    Breathing Techniques Group instruction that is supported by demonstration and informational handouts. Instructor discusses the benefits of pursed lip and diaphragmatic breathing and detailed demonstration on how to perform both.     Risk Factor Reduction Group instruction that is supported by a PowerPoint presentation. Instructor discusses the definition of a risk factor, different risk factors for pulmonary disease, and how the heart and lungs work together.   Pulmonary Diseases Group instruction provided by PowerPoint, verbal discussion, and written material to support subject matter. Instructor gives an overview of the different type of pulmonary diseases. There is also a discussion on risk factors and symptoms as well as ways to manage the diseases. Flowsheet Row PULMONARY REHAB OTHER RESPIRATORY from 01/09/2024 in [facility] for [facility], Vascular, [facility] Lung [facility]  Date 01/09/24  Educator RT  Instruction Review Code 1- Verbalizes Understanding    Stress and Energy Conservation Group instruction provided by PowerPoint, verbal discussion, and written material to  support subject matter. Instructor gives an overview of stress and the impact it can have on the body. Instructor also reviews ways to reduce stress. There is also a discussion on energy conservation and ways to conserve energy throughout the day.   Warning Signs and Symptoms Group instruction provided by PowerPoint, verbal discussion, and written material to support subject matter. Instructor reviews warning signs and symptoms of stroke, heart attack, cold and flu. Instructor also reviews ways to prevent the spread of infection.   Other Education Group or individual verbal, written, or video instructions that support the educational goals of the pulmonary  rehab program.    Knowledge Questionnaire Score:  Knowledge Questionnaire Score - 02/13/24 1029       Knowledge Questionnaire Score   Pre Score 17/18    Post Score 16/18          Core Components/Risk Factors/Patient Goals at Admission:  Personal Goals and Risk Factors at Admission - 12/27/23 1125       Core Components/Risk Factors/Patient Goals on Admission   Improve shortness of breath with ADL's Yes    Intervention Provide education, individualized exercise plan and daily activity instruction to help decrease symptoms of SOB with activities of daily living.    Expected Outcomes Short Term: Improve cardiorespiratory fitness to achieve a reduction of symptoms when performing ADLs;Long Term: Be able to perform more ADLs without symptoms or delay the onset of symptoms    Heart Failure Yes    Intervention Provide a combined exercise and nutrition program that is supplemented with education, support and counseling about heart failure. Directed toward relieving symptoms such as shortness of breath, decreased exercise tolerance, and extremity edema.    Expected Outcomes Improve functional capacity of life;Short term: Attendance in program 2-3 days a week with increased exercise capacity. Reported lower sodium intake. Reported increased  fruit and vegetable intake. Reports medication compliance.;Short term: Daily weights obtained and reported for increase. Utilizing diuretic protocols set by physician.;Long term: Adoption of self-care skills and reduction of barriers for early signs and symptoms recognition and intervention leading to self-care maintenance.          Core Components/Risk Factors/Patient Goals Review:   Goals and Risk Factor Review     Row Name 01/15/24 1117 02/10/24 1451           Core Components/Risk Factors/Patient Goals Review   Personal Goals Review Improve shortness of breath with ADL's;Develop more efficient breathing techniques such as purse lipped breathing and diaphragmatic breathing and practicing self-pacing with activity.;Heart Failure Improve shortness of breath with ADL's;Develop more efficient breathing techniques such as purse lipped breathing and diaphragmatic breathing and practicing self-pacing with activity.;Heart Failure      Review Monthly review of patients Core Components/Risk Factors/Patient Goals are as follows: Goal progressing for improving shortness of breath. Makinsey is currently exercising on 2L to keep sats > 88%. She is currently exercising on the Nustep for 30 minutes. Goal progressing for developing more efficient breathing techniques such as purse lipped breathing and diaphragmatic breathing; and practicing self-pacing with activity.  Goal progressing for heart failure. We will continue to monitor her progress throughout the program. Monthly review of patients Core Components/Risk Factors/Patient Goals are as follows: Goal progressing for improving shortness of breath. Teana is currently exercising on 2L to keep sats > 88%. She is currently exercising on the Nustep for 30 minutes, but will hopefully be transitioning to walking the track for one of her stations. Goal met for developing more efficient breathing techniques such as purse lipped breathing and diaphragmatic breathing;  and practicing self-pacing with activity. Katiejo is able to demonstrate PLB when she becomes SOB. She also knows how to self pace based on her REP/dyspnea scores. Goal progressing for heart failure. We will continue to monitor her progress throughout the program.      Expected Outcomes To improve shortness of breath with ADL's, have no heart failure exacerbations and develop more efficient breathing techniques such as purse lipped breathing and diaphragmatic breathing; and practicing self-pacing with activity To improve shortness of breath with ADL's and have no heart failure exacerbations  Core Components/Risk Factors/Patient Goals at Discharge (Final Review):   Goals and Risk Factor Review - 02/10/24 1451       Core Components/Risk Factors/Patient Goals Review   Personal Goals Review Improve shortness of breath with ADL's;Develop more efficient breathing techniques such as purse lipped breathing and diaphragmatic breathing and practicing self-pacing with activity.;Heart Failure    Review Monthly review of patients Core Components/Risk Factors/Patient Goals are as follows: Goal progressing for improving shortness of breath. Delaynie is currently exercising on 2L to keep sats > 88%. She is currently exercising on the Nustep for 30 minutes, but will hopefully be transitioning to walking the track for one of her stations. Goal met for developing more efficient breathing techniques such as purse lipped breathing and diaphragmatic breathing; and practicing self-pacing with activity. Katharine is able to demonstrate PLB when she becomes SOB. She also knows how to self pace based on her REP/dyspnea scores. Goal progressing for heart failure. We will continue to monitor her progress throughout the program.    Expected Outcomes To improve shortness of breath with ADL's and have no heart failure exacerbations          ITP Comments:Pt is making expected progress toward Pulmonary Rehab goals after  completing 11 session(s). Recommend continued exercise, life style modification, education, and utilization of breathing techniques to increase stamina and strength, while also decreasing shortness of breath with exertion.  Dr. Slater Staff is Medical Director for Pulmonary Rehab at [facility].     Comments:      [1]  Current Outpatient Medications:    albuterol  (VENTOLIN  HFA) 108 (90 Base) MCG/ACT inhaler, Inhale 2 puffs into the lungs every 4 hours as needed for wheezing or shortness of breath., Disp: 18 g, Rfl: 3   benzonatate  (TESSALON ) 200 MG capsule, Take 1 capsule (200 mg total) by mouth 3 (three) times daily as needed for cough., Disp: 30 capsule, Rfl: 1   Boric Acid Vaginal 600 MG SUPP, Place 1 suppository vaginally at bedtime for 21 days., Disp: 21 suppository, Rfl: 0   budesonide  (ENTOCORT EC ) 3 MG 24 hr capsule, Take 3 capsules (9 mg total) by mouth in the morning., Disp: 270 capsule, Rfl: 3   budesonide -formoterol  (SYMBICORT ) 160-4.5 MCG/ACT inhaler, Inhale 2 puffs into the lungs 2 (two) times daily., Disp: 1 each, Rfl: 12   cyanocobalamin  (VITAMIN B12) 1000 MCG/ML injection, INJECT 1 ML INTRAMUSCULARLY ONCE A MONTH, Disp: 1 mL, Rfl: 32   estradiol  (ESTRACE  VAGINAL) 0.1 MG/GM vaginal cream, Place 1 g vaginally 3 (three) times a week., Disp: 42.5 g, Rfl: 3   flecainide  (TAMBOCOR ) 50 MG tablet, Take by mouth., Disp: , Rfl:    furosemide  (LASIX ) 40 MG tablet, Take 1 tablet (40 mg total) by mouth daily., Disp: 90 tablet, Rfl: 3   gabapentin  (NEURONTIN ) 100 MG capsule, TAKE 1 CAPSULE (100 MG TOTAL) BY MOUTH THREE TIMES DAILY., Disp: 90 capsule, Rfl: 3   gabapentin  (NEURONTIN ) 600 MG tablet, TAKE 1 TABLET BY MOUTH THREE TIMES A DAY, Disp: 90 tablet, Rfl: 3   HYDROcodone -acetaminophen  (NORCO) 10-325 MG tablet, One tab every 6 hours as needed for pain, Disp: 20 tablet, Rfl: 0   HYRIMOZ  40 MG/0.4ML SOAJ, Inject 40 mg into the skin once a week., Disp: , Rfl:    levothyroxine   (SYNTHROID ) 75 MCG tablet, Take 1 tablet (75 mcg total) by mouth daily., Disp: 90 tablet, Rfl: 0   LORazepam  (ATIVAN ) 1 MG tablet, Take 1 tablet (1 mg total) by mouth 2 (  two) times daily as needed for anxiety., Disp: 60 tablet, Rfl: 1   metoprolol  succinate (TOPROL -XL) 25 MG 24 hr tablet, Take 1.5 tablets (37.5 mg total) by mouth daily., Disp: 45 tablet, Rfl: 11   nystatin  (MYCOSTATIN /NYSTOP ) powder, Apply topically., Disp: , Rfl:    potassium chloride  (KLOR-CON  M) 10 MEQ tablet, Take 1 tablet (10 mEq total) by mouth daily. Take 1 tablet every other day with lasix  (furosemide ), Disp: 90 tablet, Rfl: 3   promethazine  (PHENERGAN ) 25 MG tablet, Take 1 tablet (25 mg total) by mouth every 8 (eight) hours as needed for nausea and/or vomiting, Disp: 30 tablet, Rfl: 5   tirzepatide  (ZEPBOUND ) 2.5 MG/0.5ML Pen, Inject 2.5 mg into the skin once a week., Disp: 2 mL, Rfl: 0   tiZANidine  (ZANAFLEX ) 4 MG tablet, TAKE 1 TABLET BY MOUTH 3 TIMES DAILY., Disp: 90 tablet, Rfl: 3   Ubrogepant  (UBRELVY ) 100 MG TABS, Take 1 tablet (100 mg total) by mouth every 2 (two) hours as needed. Maximum 200mg  a day., Disp: 16 tablet, Rfl: 11   Vitamin D , Ergocalciferol , (DRISDOL ) 1.25 MG (50000 UNIT) CAPS capsule, TAKE 1 CAPSULE BY MOUTH ONE TIME PER WEEK, Disp: 4 capsule, Rfl: 2 No current facility-administered medications for this encounter.  Facility-Administered Medications Ordered in Other Encounters:    gadobenate dimeglumine  (MULTIHANCE ) injection 20 mL, 20 mL, Intravenous, Once PRN, Ines Onetha NOVAK, MD [2]  Social History Tobacco Use  Smoking Status Never  Smokeless Tobacco Never

## 2024-02-24 ENCOUNTER — Ambulatory Visit: Admitting: Internal Medicine

## 2024-02-24 ENCOUNTER — Encounter: Payer: Self-pay | Admitting: Internal Medicine

## 2024-02-24 VITALS — BP 100/60 | HR 61 | Ht 66.5 in | Wt 294.0 lb

## 2024-02-24 DIAGNOSIS — R7302 Impaired glucose tolerance (oral): Secondary | ICD-10-CM | POA: Diagnosis not present

## 2024-02-24 DIAGNOSIS — E039 Hypothyroidism, unspecified: Secondary | ICD-10-CM

## 2024-02-24 DIAGNOSIS — I5031 Acute diastolic (congestive) heart failure: Secondary | ICD-10-CM

## 2024-02-24 DIAGNOSIS — Z9884 Bariatric surgery status: Secondary | ICD-10-CM

## 2024-02-24 DIAGNOSIS — E7800 Pure hypercholesterolemia, unspecified: Secondary | ICD-10-CM

## 2024-02-24 DIAGNOSIS — R002 Palpitations: Secondary | ICD-10-CM

## 2024-02-24 DIAGNOSIS — E785 Hyperlipidemia, unspecified: Secondary | ICD-10-CM | POA: Diagnosis not present

## 2024-02-24 DIAGNOSIS — I5032 Chronic diastolic (congestive) heart failure: Secondary | ICD-10-CM | POA: Diagnosis not present

## 2024-02-24 DIAGNOSIS — Z9089 Acquired absence of other organs: Secondary | ICD-10-CM

## 2024-02-24 DIAGNOSIS — Z8639 Personal history of other endocrine, nutritional and metabolic disease: Secondary | ICD-10-CM

## 2024-02-24 DIAGNOSIS — I4719 Other supraventricular tachycardia: Secondary | ICD-10-CM

## 2024-02-24 DIAGNOSIS — G4733 Obstructive sleep apnea (adult) (pediatric): Secondary | ICD-10-CM

## 2024-02-24 DIAGNOSIS — N1831 Chronic kidney disease, stage 3a: Secondary | ICD-10-CM

## 2024-02-24 DIAGNOSIS — Z6841 Body Mass Index (BMI) 40.0 and over, adult: Secondary | ICD-10-CM

## 2024-02-24 DIAGNOSIS — R5381 Other malaise: Secondary | ICD-10-CM

## 2024-02-24 DIAGNOSIS — I270 Primary pulmonary hypertension: Secondary | ICD-10-CM

## 2024-02-24 NOTE — Patient Instructions (Signed)
 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 She is frustrated with her lack of improvement. Currently oxygen dependent with fatigue and no energy. Have adjusted thyroid  medication. Glucose intolerance is mild. Currently not on a statin.

## 2024-02-25 ENCOUNTER — Encounter (HOSPITAL_COMMUNITY)
Admission: RE | Admit: 2024-02-25 | Discharge: 2024-02-25 | Disposition: A | Discharge status: Home / Self Care | Source: Ambulatory Visit | Attending: Pulmonary Disease | Admitting: Pulmonary Disease

## 2024-02-25 DIAGNOSIS — I5032 Chronic diastolic (congestive) heart failure: Secondary | ICD-10-CM

## 2024-02-25 NOTE — Progress Notes (Signed)
 Daily Session Note  Patient Details  Name: Victoria [name] MRN: [id number] Date of Birth: June 12, 1960 Referring Provider:   Conrad Ports Pulmonary Rehab Walk Test from 12/27/2023 in [facility], [facility], [facility]  Referring Provider Dewald    Encounter Date: 02/25/2024  Check In:  Session Check In - 02/25/24 1022       Check-In   Supervising physician immediately available to respond to emergencies [facility] MD immediately available    Physician(s) Orren Fabry, NP    Location [facility]    Staff Present Ronal Levin, RN, BSN;Eyonna Sandstrom Midge BS, ACSM-CEP, Exercise Physiologist;Kaylee Nicholaus, MS, ACSM-CEP, Exercise Physiologist    Virtual Visit No    Medication changes reported     No    Fall or balance concerns reported    No    Tobacco Cessation No Change    Warm-up and Cool-down Performed as group-led instruction    Resistance Training Performed Yes    VAD Patient? No    PAD/SET Patient? No      Pain Assessment   Currently in Pain? No/denies          Capillary Blood Glucose: No results found. However, due to the size of the patient record, not all encounters were searched. Please check Results Review for a complete set of results.    Tobacco Use History[1]  Goals Met:  Independence with exercise equipment Exercise tolerated well No report of concerns or symptoms today Strength training completed today  Goals Unmet:  Not Applicable  Comments: Service time is from 1006 to 1131. Pt graduated today.    Dr. Slater Staff is Medical Director for Pulmonary Rehab at [facility].     [1]  Social History Tobacco Use  Smoking Status Never  Smokeless Tobacco Never

## 2024-02-27 ENCOUNTER — Ambulatory Visit: Payer: Self-pay | Admitting: Pulmonary Disease

## 2024-02-29 ENCOUNTER — Other Ambulatory Visit: Payer: Self-pay | Admitting: Internal Medicine

## 2024-03-03 NOTE — Progress Notes (Signed)
 Discharge Progress Report  Patient Details  Name: Victoria [name] MRN: [id number] Date of Birth: May 06, 1960 Referring Provider:   Conrad Ports Pulmonary Rehab Walk Test from 12/27/2023 in [facility], [facility], [facility]  Referring Provider Dewald     Number of Visits: 12  Reason for Discharge:  Patient reached a stable level of exercise. Patient independent in their exercise. Patient has met program and personal goals.  Smoking History:  Tobacco Use History[1]  Diagnosis:  Heart failure, diastolic, chronic (HCC)  ADL [facility]:  Pulmonary Assessment Scores     Row Name 12/27/23 1128 02/13/24 1029       ADL [facility]SD   ADL Phase Entry Exit    SOB Score total 90 70      CAT Score   CAT Score 26 27      mMRC Score   mMRC Score 4 --       Initial Exercise Prescription:  Initial Exercise Prescription - 12/27/23 1200       Date of Initial Exercise RX and Referring Provider   Date 12/27/23    Referring Provider Dewald    Expected Discharge Date 03/31/24      Oxygen   Oxygen Continuous    Liters 2    Maintain Oxygen Saturation 88% or higher      NuStep   Level 1    SPM 55    Minutes 30    METs 1.4      Prescription Details   Frequency (times per week) 2    Duration Progress [name] 30 minutes of continuous aerobic without signs/symptoms of physical distress      Intensity   THRR 40-80% of Max Heartrate 63-126    Ratings of Perceived Exertion 11-13    Perceived Dyspnea 0-4      Progression   Progression Continue progressive overload as per policy without signs/symptoms or physical distress.      Resistance Training   Training Prescription Yes    Weight red bands    Reps 10-15          Discharge Exercise Prescription (Final Exercise Prescription Changes):  Exercise Prescription Changes - 02/18/24 1200       Response [name] Exercise   Blood Pressure (Admit) 100/70    Blood Pressure (Exercise) 110/64    Blood Pressure  (Exit) 100/80    Heart Rate (Admit) 64 bpm    Heart Rate (Exercise) 90 bpm    Heart Rate (Exit) 70 bpm    Oxygen Saturation (Admit) 99 %   2L   Oxygen Saturation (Exercise) 94 %   2L   Oxygen Saturation (Exit) 96 %   2L   Rating of Perceived Exertion (Exercise) 12    Perceived Dyspnea (Exercise) 3    Duration Continue with 30 min of aerobic exercise without signs/symptoms of physical distress.    Intensity THRR unchanged      Progression   Progression Continue [name] progress workloads [name] maintain intensity without signs/symptoms of physical distress.      Resistance Training   Weight red bands    Reps 10-15    Time 10 Minutes      Oxygen   Oxygen Continuous    Liters 2      NuStep   Level 2    SPM 78    Minutes 15    METs 2      Track   Laps 7    Minutes 15  METs 2.08      Oxygen   Maintain Oxygen Saturation 88% or higher          Functional Capacity:  6 Minute Walk     Row Name 12/27/23 1241 02/13/24 1200       6 Minute Walk   Phase Initial Discharge    Distance 645 feet 980 feet    Distance % Change -- 51.94 %    Distance Feet Change -- 335 ft    Walk Time 6 minutes 6 minutes    # of Rest Breaks 1  1:16-2:20 0    MPH 1.22 1.86    METS 0.81 1.66    RPE 15 15    Perceived Dyspnea  2 3    VO2 Peak 2.85 5.82    Symptoms No No    Resting HR 62 bpm 74 bpm    Resting BP 112/62 104/60    Resting Oxygen Saturation  98 % 100 %    Exercise Oxygen Saturation  during 6 min walk 86 % 97 %    Max Ex. HR 64 bpm 106 bpm    Max Ex. BP 126/86 116/64    2 Minute Post BP 111/84  automatic 108/62      Interval HR   1 Minute HR 78 86    2 Minute HR 75 96    3 Minute HR 71 99    4 Minute HR 78 102    5 Minute HR 74 104    6 Minute HR 72 106    2 Minute Post HR 64 92    Interval Heart Rate? Yes Yes      Interval Oxygen   Interval Oxygen? Yes Yes    Baseline Oxygen Saturation % 98 % 100 %    1 Minute Oxygen Saturation % 93 % 100 %    1 Minute Liters of Oxygen  0 L 2 L    2 Minute Oxygen Saturation % 96 % 99 %    2 Minute Liters of Oxygen 0 L 2 L    3 Minute Oxygen Saturation % 92 % 97 %    3 Minute Liters of Oxygen 0 L 2 L    4 Minute Oxygen Saturation % 86 % 99 %    4 Minute Liters of Oxygen 0 L  placed on 2L 2 L    5 Minute Oxygen Saturation % 98 % 99 %    5 Minute Liters of Oxygen 2 L 2 L    6 Minute Oxygen Saturation % 93 % 100 %    6 Minute Liters of Oxygen 2 L 2 L    2 Minute Post Oxygen Saturation % 100 % 100 %    2 Minute Post Liters of Oxygen 2 L 2 L       Psychological, QOL, Others - Outcomes: PHQ 2/9:    02/13/2024   10:26 AM 12/27/2023   12:15 PM 12/26/2023   12:13 PM 07/08/2023   11:46 AM 02/12/2023    8:38 AM  Depression screen PHQ 2/9  Decreased Interest 1 2 1 2 3   Down, Depressed, Hopeless 2 1 1 1 2   PHQ - 2 Score 3 3 2 3 5   Altered sleeping 2 2 3 3 3   Tired, decreased energy 2 3 3 3 3   Change in appetite 2 2 2 3 3   Feeling bad or failure about yourself  2 0 0 0 2  Trouble concentrating 1 0 1  0 3  Moving slowly or fidgety/restless 1 0 1 3 3   Suicidal thoughts 0 0 0 0 0  PHQ-9 Score 13 10  12  15  22    Difficult doing work/chores Somewhat difficult Somewhat difficult Somewhat difficult Very difficult      Data saved with a previous flowsheet row definition    Quality of Life:   Personal Goals: Goals established at orientation with interventions provided [name] work toward goal.  Personal Goals and Risk Factors at Admission - 12/27/23 1125       Core Components/Risk Factors/Patient Goals on Admission   Improve shortness of breath with ADL's Yes    Intervention Provide education, individualized exercise plan and daily activity instruction [name] help decrease symptoms of SOB with activities of daily living.    Expected Outcomes Short Term: Improve cardiorespiratory fitness [name] achieve a reduction of symptoms when performing ADLs;Long Term: Be able [name] perform more ADLs without symptoms or delay the onset of symptoms     Heart Failure Yes    Intervention Provide a combined exercise and nutrition program that is supplemented with education, support and counseling about heart failure. Directed toward relieving symptoms such as shortness of breath, decreased exercise tolerance, and extremity edema.    Expected Outcomes Improve functional capacity of life;Short term: Attendance in program 2-3 days a week with increased exercise capacity. Reported lower sodium intake. Reported increased fruit and vegetable intake. Reports medication compliance.;Short term: Daily weights obtained and reported for increase. Utilizing diuretic protocols set by physician.;Long term: Adoption of self-care skills and reduction of barriers for early signs and symptoms recognition and intervention leading [name] self-care maintenance.           Personal Goals Discharge:  Goals and Risk Factor Review     Row Name 01/15/24 1117 02/10/24 1451 03/03/24 1528         Core Components/Risk Factors/Patient Goals Review   Personal Goals Review Improve shortness of breath with ADL's;Develop more efficient breathing techniques such as purse lipped breathing and diaphragmatic breathing and practicing self-pacing with activity.;Heart Failure Improve shortness of breath with ADL's;Develop more efficient breathing techniques such as purse lipped breathing and diaphragmatic breathing and practicing self-pacing with activity.;Heart Failure Improve shortness of breath with ADL's;Heart Failure     Review Monthly review of patients Core Components/Risk Factors/Patient Goals are as follows: Goal progressing for improving shortness of breath. Victoria [name] is currently exercising on 2L [name] keep sats > 88%. She is currently exercising on the Nustep for 30 minutes. Goal progressing for developing more efficient breathing techniques such as purse lipped breathing and diaphragmatic breathing; and practicing self-pacing with activity.  Goal progressing for heart failure. We will  continue [name] monitor her progress throughout the program. Monthly review of patients Core Components/Risk Factors/Patient Goals are as follows: Goal progressing for improving shortness of breath. Victoria [name] is currently exercising on 2L [name] keep sats > 88%. She is currently exercising on the Nustep for 30 minutes, but will hopefully be transitioning [name] walking the track for one of her stations. Goal met for developing more efficient breathing techniques such as purse lipped breathing and diaphragmatic breathing; and practicing self-pacing with activity. Victoria [name] is able [name] demonstrate PLB when she becomes SOB. She also knows how [name] self pace based on her REP/dyspnea scores. Goal progressing for heart failure. We will continue [name] monitor her progress throughout the program. Victoria [name] graduated from the program on 12/30 completing 12 [name]. Discharge review of patients Core Components/Risk Factors/Patient Goals are as follows:  Goal met for improving shortness of breath. Victoria [name] exercised on 2L [name] keep sats > 88%. She exercised on the NuStep and walked the track. Goal met for heart failure. Victoria [name] continues [name] weigh herself daily, take her prescribed medications, and follow a low sodium diet. She did not have any HF exacerbations while in the program.     Expected Outcomes [name] improve shortness of breath with ADL's, have no heart failure exacerbations and develop more efficient breathing techniques such as purse lipped breathing and diaphragmatic breathing; and practicing self-pacing with activity [name] improve shortness of breath with ADL's and have no heart failure exacerbations [name] continue [name] exercise and decrease her shortness of breath with ADLs, [name] continue [name] have control of her HF        Exercise Goals and Review:  Exercise Goals     Row Name 12/27/23 1119             Exercise Goals   Increase Physical Activity Yes       Intervention Provide advice, education, support and counseling about physical  activity/exercise needs.;Develop an individualized exercise prescription for aerobic and resistive training based on initial evaluation findings, risk stratification, comorbidities and participant's personal goals.       Expected Outcomes Short Term: Attend rehab on a regular basis [name] increase amount of physical activity.;Long Term: Exercising regularly at least 3-5 days a week.;Long Term: Add in home exercise [name] make exercise part of routine and [name] increase amount of physical activity.       Increase Strength and Stamina Yes       Intervention Provide advice, education, support and counseling about physical activity/exercise needs.;Develop an individualized exercise prescription for aerobic and resistive training based on initial evaluation findings, risk stratification, comorbidities and participant's personal goals.       Expected Outcomes Short Term: Increase workloads from initial exercise prescription for resistance, speed, and METs.;Short Term: Perform resistance training exercises routinely during rehab and add in resistance training at home;Long Term: Improve cardiorespiratory fitness, muscular endurance and strength as measured by increased METs and functional capacity ( )       Able [name] understand and use rate of perceived exertion (RPE) scale Yes       Intervention Provide education and explanation on how [name] use RPE scale       Expected Outcomes Short Term: Able [name] use RPE daily in rehab [name] express subjective intensity level;Long Term:  Able [name] use RPE [name] guide intensity level when exercising independently       Able [name] understand and use Dyspnea scale Yes       Intervention Provide education and explanation on how [name] use Dyspnea scale       Expected Outcomes Short Term: Able [name] use Dyspnea scale daily in rehab [name] express subjective sense of shortness of breath during exertion;Long Term: Able [name] use Dyspnea scale [name] guide intensity level when exercising independently       Knowledge and  understanding of Target Heart Rate Range (THRR) Yes       Intervention Provide education and explanation of THRR including how the numbers were predicted and where they are located for reference       Expected Outcomes Short Term: Able [name] state/look up THRR;Short Term: Able [name] use daily as guideline for intensity in rehab;Long Term: Able [name] use THRR [name] govern intensity when exercising independently       Understanding of Exercise Prescription Yes       Intervention Provide  education, explanation, and written materials on patient's individual exercise prescription       Expected Outcomes Short Term: Able [name] explain program exercise prescription;Long Term: Able [name] explain home exercise prescription [name] exercise independently          Exercise Goals Re-Evaluation:  Exercise Goals Re-Evaluation     Row Name 01/17/24 0951 02/07/24 0901 03/03/24 1530         Exercise Goal Re-Evaluation   Exercise Goals Review Increase Physical Activity;Able [name] understand and use Dyspnea scale;Understanding of Exercise Prescription;Increase Strength and Stamina;Knowledge and understanding of Target Heart Rate Range (THRR);Able [name] understand and use rate of perceived exertion (RPE) scale Increase Physical Activity;Able [name] understand and use Dyspnea scale;Understanding of Exercise Prescription;Increase Strength and Stamina;Knowledge and understanding of Target Heart Rate Range (THRR);Able [name] understand and use rate of perceived exertion (RPE) scale Increase Physical Activity;Able [name] understand and use Dyspnea scale;Understanding of Exercise Prescription;Increase Strength and Stamina;Knowledge and understanding of Target Heart Rate Range (THRR);Able [name] understand and use rate of perceived exertion (RPE) scale     Comments Victoria [name] has completed 4 exercise [name]. She exercises for 30 min on the Nustep. She averages 1.9 METs at level 1 on the Nustep. Victoria [name] performs the warmup and cooldown standing holding onto a chair for  balance. It is too soon [name] notate any discernable progressions. Will continue [name] monitor and progress as able. Victoria [name] has completed 8 exercise [name]. She exercises for 30 min on the Nustep. She averages 2.0 METs at level 2 on the Nustep. Victoria [name] performs the warmup and cooldown standing holding onto a chair for balance. Victoria [name] has increased her level on the Nustep as METs have also increased. Will plan [name] have her walk soon. We have discussed home exercise recenlty. Victoria [name]. Will continue [name] monitor and progress as able. Victoria [name]. Peak METs were 2.1 on the Nustep and 2.23 on the track. She is discharged due [name] insurance.     Expected Outcomes Through exercise at rehab and home, the patient will decrease shortness of breath with daily activities and feel confident in carrying out an exercise regimen at home. Through exercise at rehab and home, the patient will decrease shortness of breath with daily activities and feel confident in carrying out an exercise regimen at home. Through exercise at rehab and home, the patient will decrease shortness of breath with daily activities and feel confident in carrying out an exercise regimen at home.        Nutrition & Weight - Outcomes:    Nutrition:  Nutrition Therapy & Goals - 01/07/24 1117       Nutrition Therapy   Diet Low Sodium      Personal Nutrition Goals   Nutrition Goal Patient [name] identify strategies for weight loss with goal of 0.5-2 # per week of weight loss.    Personal Goal #2 Patient [name] limit sodium intake [name] 1500 mg/day.    Personal Goal #3 Patient [name] improve diet quality by using the plate method as a guide for meal planning [name] include lean protein/plant protein, fruits, vegetables, whole grains, nonfat dairy as part of a well-balanced diet.    Comments Pt with history of pulmonary hypertension with chronic hypoxemia and diastolic heart failure. PMH also  significant for CKD, pre-diabetes, s/p gastric bypass, and lymphocytic colitis. Pt reports being instructed [name] consume 90 g protein daily by MD; also instructed [name] avoid artificial sweeteners as  may exacerbate colitis symptoms. Pt notes difficulty consuming adequate protein due [name] inability [name] consume foods with artificial sweeteners such as light yogurt and protein shakes such as Premier. RD provided list of lean protein sources as well as suggestions for alternative protein shakes and smoothie recipes. Pt notes receiving instruction [name] follow low sodium diet; often prepares meals in home.      Intervention Plan   Intervention Prescribe, educate and counsel regarding individualized specific dietary modifications aiming towards targeted core components such as weight, hypertension, lipid management, diabetes, heart failure and other comorbidities.;Nutrition handout(s) given [name] patient.   Handouts: High Protein Food List, Weight Loss Considerations   Expected Outcomes Short Term Goal: Understand basic principles of dietary content, such as calories, fat, sodium, cholesterol and nutrients.;Long Term Goal: Adherence [name] prescribed nutrition plan.          Nutrition Discharge:   Education Questionnaire Score:  Knowledge Questionnaire Score - 02/13/24 1029       Knowledge Questionnaire Score   Pre Score 17/18    Post Score 16/18         Kirsten completed the program on 12/30, completing 12 [name]. At time of discharge psy/soc re-eval is as follows: Rindi is doing well and less stressed about exercising. She continues [name] deny any new psy/soc barriers or concerns at this time.   Discharge review of patients Core Components/Risk Factors/Patient Goals are as follows: Goal met for improving shortness of breath. Twanisha exercised on 2L [name] keep sats > 88%. She exercised on the NuStep and walked the track. Goal met for heart failure. Donis continues [name] weigh herself daily, take her prescribed  medications, and follow a low sodium diet. She did not have any HF exacerbations while in the program.   Goals reviewed with patient; copy given [name] patient.    [1]  Social History Tobacco Use  Smoking Status Never  Smokeless Tobacco Never

## 2024-03-08 ENCOUNTER — Other Ambulatory Visit: Payer: Self-pay | Admitting: Internal Medicine

## 2024-03-09 ENCOUNTER — Encounter: Payer: Self-pay | Admitting: *Deleted

## 2024-03-09 ENCOUNTER — Encounter: Payer: Self-pay | Admitting: Family

## 2024-03-11 ENCOUNTER — Encounter: Payer: Self-pay | Admitting: Family

## 2024-03-12 ENCOUNTER — Encounter: Payer: Self-pay | Admitting: Cardiology

## 2024-03-12 DIAGNOSIS — E8770 Fluid overload, unspecified: Secondary | ICD-10-CM

## 2024-03-13 MED ORDER — POTASSIUM CHLORIDE CRYS ER 10 MEQ PO TBCR: 10.0000 meq | EXTENDED_RELEASE_TABLET | ORAL | 3 refills | Status: DC

## 2024-03-15 ENCOUNTER — Other Ambulatory Visit: Payer: Self-pay | Admitting: Internal Medicine

## 2024-03-17 ENCOUNTER — Ambulatory Visit: Payer: 59 | Admitting: Internal Medicine

## 2024-03-17 ENCOUNTER — Encounter: Payer: Self-pay | Admitting: Internal Medicine

## 2024-03-17 VITALS — BP 120/70 | HR 67 | Ht 66.5 in | Wt 294.8 lb

## 2024-03-17 DIAGNOSIS — E559 Vitamin D deficiency, unspecified: Secondary | ICD-10-CM

## 2024-03-17 DIAGNOSIS — E211 Secondary hyperparathyroidism, not elsewhere classified: Secondary | ICD-10-CM | POA: Diagnosis not present

## 2024-03-17 DIAGNOSIS — K912 Postsurgical malabsorption, not elsewhere classified: Secondary | ICD-10-CM

## 2024-03-17 DIAGNOSIS — E890 Postprocedural hypothyroidism: Secondary | ICD-10-CM | POA: Diagnosis not present

## 2024-03-17 NOTE — Patient Instructions (Addendum)
 Please continue Levothyroxine  88 mcg daily:  Take the thyroid  hormone every day, with water , at least 30 minutes before breakfast, separated by at least 4 hours from: - acid reflux medications - calcium  - iron  - multivitamins  Continue high dose vitamin D  once a week.  Please come back for labs in 2 weeks.  Please return in 6 months.

## 2024-03-18 ENCOUNTER — Ambulatory Visit: Payer: Self-pay | Admitting: Nurse Practitioner

## 2024-03-25 ENCOUNTER — Encounter: Payer: Self-pay | Admitting: Internal Medicine

## 2024-03-27 ENCOUNTER — Encounter (HOSPITAL_COMMUNITY): Payer: Self-pay | Admitting: Internal Medicine

## 2024-03-27 ENCOUNTER — Ambulatory Visit (HOSPITAL_COMMUNITY)
Admission: RE | Admit: 2024-03-27 | Discharge: 2024-03-27 | Disposition: A | Source: Ambulatory Visit | Attending: Internal Medicine | Admitting: Internal Medicine

## 2024-03-27 VITALS — BP 110/82 | HR 62 | Ht 66.5 in | Wt 296.0 lb

## 2024-03-27 DIAGNOSIS — I272 Pulmonary hypertension, unspecified: Secondary | ICD-10-CM | POA: Insufficient documentation

## 2024-03-27 DIAGNOSIS — J9611 Chronic respiratory failure with hypoxia: Secondary | ICD-10-CM | POA: Insufficient documentation

## 2024-03-27 DIAGNOSIS — Z79899 Other long term (current) drug therapy: Secondary | ICD-10-CM | POA: Insufficient documentation

## 2024-03-27 DIAGNOSIS — Z9884 Bariatric surgery status: Secondary | ICD-10-CM | POA: Insufficient documentation

## 2024-03-27 DIAGNOSIS — I5032 Chronic diastolic (congestive) heart failure: Secondary | ICD-10-CM | POA: Diagnosis not present

## 2024-03-27 DIAGNOSIS — E66813 Obesity, class 3: Secondary | ICD-10-CM

## 2024-03-27 DIAGNOSIS — Z6841 Body Mass Index (BMI) 40.0 and over, adult: Secondary | ICD-10-CM | POA: Insufficient documentation

## 2024-03-27 DIAGNOSIS — G4733 Obstructive sleep apnea (adult) (pediatric): Secondary | ICD-10-CM | POA: Diagnosis not present

## 2024-03-27 DIAGNOSIS — I110 Hypertensive heart disease with heart failure: Secondary | ICD-10-CM | POA: Diagnosis not present

## 2024-03-27 DIAGNOSIS — R0602 Shortness of breath: Secondary | ICD-10-CM | POA: Diagnosis present

## 2024-03-27 DIAGNOSIS — Z9981 Dependence on supplemental oxygen: Secondary | ICD-10-CM | POA: Insufficient documentation

## 2024-03-27 DIAGNOSIS — I5031 Acute diastolic (congestive) heart failure: Secondary | ICD-10-CM | POA: Diagnosis present

## 2024-03-27 MED ORDER — SPIRONOLACTONE 25 MG PO TABS: 12.5000 mg | ORAL_TABLET | Freq: Every day | ORAL | 11 refills | Status: AC

## 2024-03-27 NOTE — Progress Notes (Signed)
 "  ADVANCED HF CLINIC CONSULT NOTE  Referring Physician: Perri Ronal PARAS, MD Primary Care: Victoria Ronal PARAS, MD Primary Cardiologist: Victoria Bihari, MD  Chief Complaint: HF  HPI:  64 y/o woman with obesity s/p gastric bypas surgery, HTN, OSA on CPAP . Referred by Dr. Perri for further evaluation of diastolic HF.   Has been followed by Dr. Bihari   Coronary CTA obtained on 04/15/2020 showed a coronary calcium  score of 0, normal coronary arteries. Echocardiogram at the time showed EF 65 to 70%, trivial MR. Heart monitor in March 2022 showed PVCs, PACs, nonsustained atrial tachycardia and nonsustained VT.   She was on flecainide  and metoprolol  succinate for palpitation and nonsustained atrial tachycardia. CTA was negative for PE but showed mild bronchial wall thickening and bronchomalacia involving right bronchus. PFTs 09/16/2023 showed FEV1 75%, FVC 69%. Nonspecific pulmonary function pattern. PET stress test obtained on 09/17/2023 showed no evidence of infarction, no coronary calcium , small area of apical ischemia in stress without change in the stress flows or function, this was likely artifactual, overall low risk study. Her monitor showed multiple episode of SVT lasting as long as 20 beats with fastest heart rate 188 corresponds to the patient's symptom of palpitation. Metoprolol  succinate was increased to 37.5 mg daily. She was referred to EP service for further evaluation. Saw Dr. Nancey of EP service on 08/07/2023, her atrial ectopy burden was less than 1% flecainide  stopped. Saw Dr. Kara in Pulmonary.   Echo 5/25 EF 70-75% normal diastology mild MR. Mild to moderate TR  RHC 9/25  RA 9 PA 42/18 (30) PCW 23 Fick 7.2/3.0  PVR 0.9 WU   Lasix  increased  She remains SOB with any activity. Wears O2 and CPAP. Swelling better on lasix  now. No orthopnea or PND. Failed Jardiance  due to yeast infections. Going to [facility]. Finished Pulmonary Rehab in December. [place] 2 miles [place]  several times per week     Past Medical History:  Diagnosis Date   Allergy  approx-5years ago   estradiol ,topical patch only-rash   Anxiety    Back pain    Blood transfusion without reported diagnosis 2010   after hip replacement   Bursitis    right shoulder   Cataract September 2019   bilateral, lasik   Chronic diastolic heart failure (HCC)    Chronic kidney disease    hematuria, has been worked up, her normal   Chronic leukopenia    intermittant since 2010, followed by Dr. Perri now   Chronic neck pain    Constipation    Degenerative joint disease of low back 09/02/2015   Dr. Hiram   Diastolic heart failure [facility])    Displacement of lumbar intervertebral disc    Dysrhythmia    Tachycardia, PVCs, PACs   Edema of both lower legs    Fibrocystic breast    Gallbladder problem    GERD (gastroesophageal reflux disease)    Headache    per pt more stress/tension   Heart palpitations    SEE EPIC ENCOUTNER , CARDIOLOGY DR. WILBERT TURNER 2018; reports on 05-16-17  i haven't had any bouts of those lately     Hemorrhoids    Hidradenitis suppurativa    History of Clostridium difficile infection 12/2010   History of exercise intolerance    ETT on 04-27-2016-- negative ([facility] treadmill score 7)   History of Helicobacter pylori infection 2001 and 09/ 2012   HSV-2 infection    genital   Hx of adenomatous colonic  polyps 10/17/2007   Hydradenitis    per pt currently treated with Humira  injection   Hypertension    Hyperthyroidism    Hypocupremia    Hypothyroidism    Hypothyroidism, postsurgical 1980   IBS (irritable bowel syndrome)    Insomnia    Iron  deficiency    Joint pain    Leukopenia    Lumbosacral radiculopathy    Lymphocytic colitis 05/14/2022   dx from colonoscopy   Medial meniscus tear    right knee   Mild obstructive sleep apnea    Mitral regurgitation    Mild by echo 06/2023   Numbness and tingling of right arm    Obesity    Osteoarthritis    Palpitations     Pernicious anemia    b12 def   Peroneal neuropathy    PONV (postoperative nausea and vomiting)    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 18 g 3   benzonatate  (TESSALON ) 200 MG capsule Take 1 capsule (200 mg total) by mouth 3 (three) times daily as needed for cough. 30 capsule 1   budesonide  (ENTOCORT EC ) 3 MG 24 hr capsule Take 3 capsules (9 mg total) by mouth in the morning. 270 capsule 3   budesonide -formoterol  (SYMBICORT ) 160-4.5 MCG/ACT inhaler Inhale 2 puffs into the lungs 2 (two) times daily. 1 each 12   cyanocobalamin  (VITAMIN B12) 1000 MCG/ML injection INJECT 1 ML INTRAMUSCULARLY ONCE A MONTH 1 mL 32   estradiol  (ESTRACE  VAGINAL) 0.1 MG/GM vaginal cream Place 1 g vaginally 3 (three) times a week. 42.5 g 3   furosemide  (LASIX ) 40 MG tablet Take 1 tablet (40 mg total) by mouth daily. 90 tablet 3   gabapentin  (NEURONTIN ) 600 MG tablet TAKE 1 TABLET BY MOUTH THREE TIMES A DAY 90 tablet 3   HYDROcodone -acetaminophen  (NORCO) 10-325 MG tablet One tab every 6 hours as needed for pain 20 tablet 0   HYRIMOZ  40 MG/0.4ML SOAJ Inject 40 mg into the skin once a week.     levothyroxine  (SYNTHROID ) 88 MCG tablet Take 1 tablet (88 mcg total) by mouth daily. 90 tablet 1   LORazepam  (ATIVAN ) 1 MG tablet TAKE 1 TABLET BY MOUTH 2 TIMES DAILY AS NEEDED FOR ANXIETY. 60 tablet 5   metoprolol  succinate (TOPROL -XL) 25 MG 24 hr tablet Take 1.5 tablets  (37.5 mg total) by mouth daily. 45 tablet 11   nystatin  (MYCOSTATIN /NYSTOP ) powder Apply topically.     potassium chloride  (KLOR-CON  M) 10 MEQ tablet Take 1 tablet (10 mEq total) by mouth every other day. Take 1 tablet every other day with lasix  (furosemide ) 45 tablet 3   promethazine  (PHENERGAN ) 25 MG tablet Take 1 tablet (25 mg total) by mouth every 8 (eight) hours as needed for nausea and/or vomiting 30 tablet 5   tiZANidine  (ZANAFLEX ) 4 MG tablet TAKE 1 TABLET  BY MOUTH THREE TIMES A DAY 90 tablet 3   Ubrogepant  (UBRELVY ) 100 MG TABS Take 1 tablet (100 mg total) by mouth every 2 (two) hours as needed. Maximum 200mg  a day. 16 tablet 11   Vitamin D , Ergocalciferol , (DRISDOL ) 1.25 MG (50000 UNIT) CAPS capsule TAKE 1 CAPSULE BY MOUTH ONE TIME PER WEEK 4 capsule 2   No current facility-administered medications for this encounter.   Facility-Administered Medications Ordered in Other Encounters  Medication Dose Route Frequency Provider Last Rate Last Admin   gadobenate dimeglumine  (MULTIHANCE ) injection 20 mL  20 mL Intravenous Once PRN Ines Onetha NOVAK, MD        Allergies[1]    Social History   Socioeconomic History   Marital status: Married    Spouse name: Adriana   Number of children: 2   Years of education: Not on file   Highest education level: Bachelor's degree (e.g., BA, AB, BS)  Occupational History   Occupation: [employer] Operational Officer [employer]    Employer: [employer]  Tobacco Use   Smoking status: Never   Smokeless tobacco: Never  Vaping Use   Vaping status: Never Used  Substance and Sexual Activity   Alcohol use: No   Drug use: No   Sexual activity: Not Currently    Partners: Male    Birth control/protection: Surgical    Comment: hysterectomy  Other Topics Concern   Not on file  Social History Narrative   Lives at home with spouse   Right handed   Caffeine: diet zero mtn dew, occasionally     2025 - Recently retired from [employer] as an [employer], previously worked full-time,  now working 1 day/ week. Divorced, remarried - husband is disabled. 2 adult twin daughters. Non-smoker or drinker.    Social Drivers of Health   Tobacco Use: Low Risk (03/27/2024)   Patient History    Smoking Tobacco Use: Never    Smokeless Tobacco Use: Never    Passive Exposure: Not on file  Financial Resource Strain: Not on file  Food Insecurity: Low Risk (01/16/2024)   Received from [facility]   Epic    Within the past 12 months, you worried that your food would run out before you got money to buy more: Never true    Within the past 12 months, the food you bought just didn't last and you didn't have money to get more. : Never true  Transportation Needs: No Transportation Needs (01/16/2024)   Received from [facility]    In the past 12 months, has lack of reliable transportation kept you from medical appointments, meetings, work or from getting things needed for daily living? : No  Physical Activity: Not on file  Stress: Not on file  Social Connections: Not on file  Intimate Partner Violence: Not At Risk (07/13/2022)   Humiliation, Afraid, Rape, and Kick questionnaire    Fear of Current or Ex-Partner: No    Emotionally Abused: No    Physically Abused: No    Sexually Abused: No  Depression (PHQ2-9): High Risk (02/13/2024)   Depression (PHQ2-9)    PHQ-2 Score: 13  Alcohol Screen: Low Risk (07/08/2023)   Alcohol Screen    Last Alcohol Screening Score (AUDIT): 0  Housing: Low Risk (01/16/2024)   Received from [facility]   Epic    What is your living situation today?: I have a steady place to live    Think about the place you live. Do you have problems with  any of the following? Choose all that apply:: None/None on this list  Utilities: Low Risk (01/16/2024)   Received from [facility]   Utilities    In the past 12 months has the electric, gas, oil, or water  company threatened to shut off services in your home? : No  Health Literacy: Adequate Health  Literacy (07/08/2023)   B1300 Health Literacy    Frequency of need for help with medical instructions: Never      Family History  Problem Relation Age of Onset   High blood pressure Mother    Hypertension Mother    Headache Mother    High Cholesterol Mother    Thyroid  disease Mother    Obesity Mother    Kidney disease Mother    Arthritis Mother    Hyperlipidemia Mother    Hypertension Father    Diabetes Father    High blood pressure Father    High Cholesterol Father    Heart disease Father    Obesity Father    Vision loss Father    Diabetes Sister    Hypertension Sister    Obesity Sister    Thyroid  disease Brother    Thyroid  disease Maternal Aunt    Hypertension Maternal Grandmother    Stroke Maternal Grandmother    Diabetes Paternal Grandmother    Vision loss Paternal Grandmother    Diabetes Paternal Aunt    Hyperlipidemia Maternal Aunt    Hypertension Maternal Aunt    Obesity Maternal Aunt    Migraines Neg Hx     Vitals:   03/27/24 1000  BP: 110/82  Pulse: 62  SpO2: 100%  Weight: 134.3 kg (296 lb)  Height: 5' 6.5 (1.689 m)   Wt Readings from Last 3 Encounters:  03/27/24 134.3 kg (296 lb)  03/17/24 133.7 kg (294 lb 12.8 oz)  02/24/24 133.4 kg (294 lb)   Body mass index is 47.06 kg/m.   PHYSICAL EXAM: General:  Obese woman on O2. No respiratory difficulty HEENT: normal Neck: supple. no JVD. Carotids 2+ bilat; no bruits. No lymphadenopathy or thryomegaly appreciated. Cor: PMI nondisplaced. Regular rate & rhythm. No rubs, gallops or murmurs. Lungs: clear Abdomen: Obese soft, nontender, nondistended. No hepatosplenomegaly. No bruits or masses. Good bowel sounds. Extremities: no cyanosis, clubbing, rash, edema Neuro: alert & oriented x 3, cranial nerves grossly intact. moves all 4 extremities w/o difficulty. Affect pleasant.   ASSESSMENT & PLAN:  1. Chronic diastolic dysfunction - Echo 5/25 EF 70-75% normal diastology mild MR. Mild to moderate TR -  RHC 9/25  RA 9 PA 42/18 (30) PCW 23 Fick 7.2/3.0  PVR 0.9 WU  - NYHA III multifactorial - Volume status improved with increased lasix . ReDS today 32% (normal lung water ) - Failed Jardiance  due to yeast infections - Continue lasix  40 daily - Add spiro 12.5 daily. Can stop KCL  2. Pulmonary HTN - this is pulmonary venous HTN (post-capillary) - pressures should improve with diuresis  3. Morbid obesity - suspect this is root of most of her symptoms - Body mass index is 47.06 kg/m. - [facility] - will refer to PharmD for GLP1-RA as I think this is absolutely mandatory for her  4. OSA, moderate by report - on CPAP - will start GLP1RA  5. Chronic hypoxic resp failure - follows with Pulmonary - continue O2  Toribio Fuel, MD  10:26 AM      [1]  Allergies Allergen Reactions   Other Other (See Comments)  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   "

## 2024-03-27 NOTE — Patient Instructions (Signed)
 Medication Changes:  STOP Potassium  START Spironolactone  12.5 mg (1/2 tab) Daily  Referrals:  You have been referred to Pharmacy clinic for weight loss medication, they will call you to schedule  Special Instructions // Education:  Do the following things EVERYDAY: Weigh yourself in the morning before breakfast. Write it down and keep it in a log. Take your medicines as prescribed Eat low salt foods--Limit salt (sodium) to 2000 mg per day.  Stay as active as you can everyday Limit all fluids for the day to less than 2 liters   Follow-Up in: 6 months (July/Aug 2026), **PLEASE CALL OUR OFFICE IN MAY TO SCHEDULE THIS APPOINTMENT   At the [facility], you and your health needs are our priority. We have a designated team specialized in the treatment of Heart Failure. This Care Team includes your primary Heart Failure Specialized Cardiologist (physician), Advanced Practice Providers (APPs- Physician Assistants and Nurse Practitioners), and Pharmacist who all work together to provide you with the care you need, when you need it.   You may see any of the following providers on your designated Care Team at your next follow up:  Dr. Toribio Fuel Dr. Ezra Shuck Dr. Odis Brownie Greig Mosses, NP Caffie Shed, [place] [facility] [place], [place] Beckey Coe, NP Jordan Lee, NP Tinnie Redman, PharmD   Please be sure to bring in all your medications bottles to every appointment.   Need to Contact Us :  If you have any questions or concerns before your next appointment please send us  a message through [place] or call our office at [phone].    TO LEAVE A MESSAGE FOR THE NURSE SELECT OPTION 2, PLEASE LEAVE A MESSAGE INCLUDING: YOUR NAME DATE OF BIRTH CALL BACK NUMBER REASON FOR CALL**this is important as we prioritize the call backs  YOU WILL RECEIVE A CALL BACK THE SAME DAY AS LONG AS YOU CALL BEFORE 4:00 PM

## 2024-03-30 ENCOUNTER — Telehealth: Payer: Self-pay

## 2024-03-30 ENCOUNTER — Encounter: Payer: Self-pay | Admitting: Internal Medicine

## 2024-03-30 ENCOUNTER — Ambulatory Visit

## 2024-03-30 VITALS — Ht 66.5 in | Wt 300.8 lb

## 2024-03-30 DIAGNOSIS — E66813 Obesity, class 3: Secondary | ICD-10-CM

## 2024-03-31 ENCOUNTER — Other Ambulatory Visit

## 2024-03-31 ENCOUNTER — Other Ambulatory Visit (HOSPITAL_COMMUNITY): Payer: Self-pay

## 2024-03-31 ENCOUNTER — Telehealth: Payer: Self-pay | Admitting: Pharmacy Technician

## 2024-03-31 ENCOUNTER — Encounter: Payer: Self-pay | Admitting: Family

## 2024-03-31 NOTE — Telephone Encounter (Signed)
 Pharmacy Patient Advocate Encounter   Received notification from Pt Calls Messages that prior authorization for zepbound  is required/requested.   Insurance verification completed.   The patient is insured through [employer].   Per test claim: PA required; PA submitted to above mentioned insurance via Latent Key/confirmation #/EOC [id number] Status is pending

## 2024-04-01 ENCOUNTER — Ambulatory Visit: Payer: Self-pay | Admitting: Internal Medicine

## 2024-04-01 LAB — VITAMIN D 25 HYDROXY (VIT D DEFICIENCY, FRACTURES): Vit D, 25-Hydroxy: 66 ng/mL (ref 30–100)

## 2024-04-01 LAB — T4, FREE: Free T4: 1.2 ng/dL (ref 0.8–1.8)

## 2024-04-01 LAB — TSH: TSH: 4.03 m[IU]/L (ref 0.40–4.50)

## 2024-04-01 NOTE — Telephone Encounter (Signed)
 Please see other encounter.

## 2024-04-06 ENCOUNTER — Other Ambulatory Visit

## 2024-04-13 ENCOUNTER — Ambulatory Visit: Admitting: Internal Medicine

## 2024-04-21 ENCOUNTER — Ambulatory Visit: Admitting: Nurse Practitioner

## 2024-04-21 ENCOUNTER — Ambulatory Visit: Admitting: Obstetrics and Gynecology

## 2024-04-28 ENCOUNTER — Ambulatory Visit: Admitting: Obstetrics and Gynecology

## 2024-05-22 ENCOUNTER — Ambulatory Visit: Admitting: Pulmonary Disease

## 2024-06-02 ENCOUNTER — Ambulatory Visit: Admitting: Pulmonary Disease

## 2024-07-28 ENCOUNTER — Inpatient Hospital Stay: Payer: Self-pay | Admitting: Family

## 2024-07-28 ENCOUNTER — Inpatient Hospital Stay: Payer: Self-pay

## 2024-08-03 ENCOUNTER — Ambulatory Visit: Admitting: Cardiology

## 2024-09-14 ENCOUNTER — Ambulatory Visit: Admitting: Internal Medicine
